# Patient Record
Sex: Female | Born: 1940
Health system: Southern US, Community
[De-identification: ages and names within clinical notes are randomized; demographics above are authoritative.]

## PROBLEM LIST (undated history)

## (undated) DIAGNOSIS — I350 Nonrheumatic aortic (valve) stenosis: Secondary | ICD-10-CM

## (undated) DIAGNOSIS — R51 Headache: Secondary | ICD-10-CM

## (undated) DIAGNOSIS — R911 Solitary pulmonary nodule: Secondary | ICD-10-CM

## (undated) DIAGNOSIS — K5730 Diverticulosis of large intestine without perforation or abscess without bleeding: Secondary | ICD-10-CM

## (undated) DIAGNOSIS — K529 Noninfective gastroenteritis and colitis, unspecified: Secondary | ICD-10-CM

## (undated) DIAGNOSIS — B9689 Other specified bacterial agents as the cause of diseases classified elsewhere: Secondary | ICD-10-CM

## (undated) DIAGNOSIS — D649 Anemia, unspecified: Secondary | ICD-10-CM

## (undated) DIAGNOSIS — Z780 Asymptomatic menopausal state: Secondary | ICD-10-CM

## (undated) DIAGNOSIS — R011 Cardiac murmur, unspecified: Secondary | ICD-10-CM

## (undated) DIAGNOSIS — Z9889 Other specified postprocedural states: Secondary | ICD-10-CM

## (undated) DIAGNOSIS — N184 Chronic kidney disease, stage 4 (severe): Secondary | ICD-10-CM

## (undated) DIAGNOSIS — I509 Heart failure, unspecified: Secondary | ICD-10-CM

## (undated) DIAGNOSIS — M1990 Unspecified osteoarthritis, unspecified site: Secondary | ICD-10-CM

## (undated) DIAGNOSIS — N186 End stage renal disease: Secondary | ICD-10-CM

## (undated) DIAGNOSIS — I639 Cerebral infarction, unspecified: Secondary | ICD-10-CM

## (undated) DIAGNOSIS — J329 Chronic sinusitis, unspecified: Secondary | ICD-10-CM

## (undated) DIAGNOSIS — I10 Essential (primary) hypertension: Secondary | ICD-10-CM

## (undated) DIAGNOSIS — N938 Other specified abnormal uterine and vaginal bleeding: Secondary | ICD-10-CM

## (undated) DIAGNOSIS — K5641 Fecal impaction: Secondary | ICD-10-CM

## (undated) DIAGNOSIS — Z992 Dependence on renal dialysis: Secondary | ICD-10-CM

## (undated) DIAGNOSIS — B356 Tinea cruris: Secondary | ICD-10-CM

## (undated) HISTORY — DX: Diverticulosis of large intestine without perforation or abscess without bleeding: K57.30

## (undated) HISTORY — DX: Unspecified osteoarthritis, unspecified site: M19.90

## (undated) HISTORY — PX: IRIDOTOMY / IRIDECTOMY: SHX165

## (undated) HISTORY — PX: ABDOMINAL HYSTERECTOMY: SHX81

## (undated) HISTORY — DX: Tinea cruris: B35.6

## (undated) HISTORY — DX: Other specified abnormal uterine and vaginal bleeding: N93.8

## (undated) HISTORY — PX: COLONOSCOPY: SHX174

## (undated) HISTORY — DX: Solitary pulmonary nodule: R91.1

## (undated) HISTORY — DX: Essential (primary) hypertension: I10

## (undated) HISTORY — DX: Other specified bacterial agents as the cause of diseases classified elsewhere: B96.89

## (undated) HISTORY — DX: Chronic sinusitis, unspecified: J32.9

## (undated) HISTORY — DX: Asymptomatic menopausal state: Z78.0

## (undated) HISTORY — DX: Noninfective gastroenteritis and colitis, unspecified: K52.9

## (undated) HISTORY — DX: Chronic kidney disease, stage 4 (severe): N18.4

## (undated) HISTORY — DX: Other specified postprocedural states: Z98.89

## (undated) HISTORY — DX: Fecal impaction: K56.41

## (undated) HISTORY — DX: Cerebral infarction, unspecified: I63.9

---

## 1997-12-13 ENCOUNTER — Other Ambulatory Visit: Admission: RE | Admit: 1997-12-13 | Discharge: 1997-12-13 | Payer: Self-pay | Admitting: *Deleted

## 1997-12-13 ENCOUNTER — Encounter: Admission: RE | Admit: 1997-12-13 | Discharge: 1997-12-13 | Payer: Self-pay | Admitting: Hematology and Oncology

## 1998-01-19 ENCOUNTER — Ambulatory Visit: Admission: RE | Admit: 1998-01-19 | Discharge: 1998-01-19 | Payer: Self-pay | Admitting: *Deleted

## 1998-07-02 ENCOUNTER — Encounter: Payer: Self-pay | Admitting: *Deleted

## 1998-07-02 ENCOUNTER — Emergency Department (HOSPITAL_COMMUNITY): Admission: EM | Admit: 1998-07-02 | Discharge: 1998-07-02 | Payer: Self-pay | Admitting: Emergency Medicine

## 1999-03-15 ENCOUNTER — Encounter: Admission: RE | Admit: 1999-03-15 | Discharge: 1999-03-15 | Payer: Self-pay | Admitting: Internal Medicine

## 1999-08-21 ENCOUNTER — Encounter: Admission: RE | Admit: 1999-08-21 | Discharge: 1999-08-21 | Payer: Self-pay | Admitting: Internal Medicine

## 2000-03-17 ENCOUNTER — Encounter: Admission: RE | Admit: 2000-03-17 | Discharge: 2000-03-17 | Payer: Self-pay | Admitting: Internal Medicine

## 2002-12-30 ENCOUNTER — Emergency Department (HOSPITAL_COMMUNITY): Admission: EM | Admit: 2002-12-30 | Discharge: 2002-12-30 | Payer: Self-pay | Admitting: Emergency Medicine

## 2002-12-30 ENCOUNTER — Encounter: Payer: Self-pay | Admitting: Emergency Medicine

## 2003-01-27 ENCOUNTER — Encounter: Payer: Self-pay | Admitting: *Deleted

## 2003-01-27 ENCOUNTER — Inpatient Hospital Stay (HOSPITAL_COMMUNITY): Admission: EM | Admit: 2003-01-27 | Discharge: 2003-01-28 | Payer: Self-pay | Admitting: *Deleted

## 2003-02-05 ENCOUNTER — Emergency Department (HOSPITAL_COMMUNITY): Admission: EM | Admit: 2003-02-05 | Discharge: 2003-02-06 | Payer: Self-pay | Admitting: Emergency Medicine

## 2003-02-05 ENCOUNTER — Encounter: Payer: Self-pay | Admitting: Emergency Medicine

## 2003-02-07 ENCOUNTER — Emergency Department (HOSPITAL_COMMUNITY): Admission: EM | Admit: 2003-02-07 | Discharge: 2003-02-07 | Payer: Self-pay | Admitting: Emergency Medicine

## 2003-02-07 ENCOUNTER — Encounter: Payer: Self-pay | Admitting: Emergency Medicine

## 2003-02-08 ENCOUNTER — Encounter: Admission: RE | Admit: 2003-02-08 | Discharge: 2003-02-08 | Payer: Self-pay | Admitting: Internal Medicine

## 2003-02-10 ENCOUNTER — Ambulatory Visit (HOSPITAL_COMMUNITY): Admission: RE | Admit: 2003-02-10 | Discharge: 2003-02-10 | Payer: Self-pay | Admitting: Internal Medicine

## 2003-02-10 ENCOUNTER — Encounter: Payer: Self-pay | Admitting: Internal Medicine

## 2003-02-15 ENCOUNTER — Encounter: Admission: RE | Admit: 2003-02-15 | Discharge: 2003-02-15 | Payer: Self-pay | Admitting: Internal Medicine

## 2003-02-22 ENCOUNTER — Encounter: Admission: RE | Admit: 2003-02-22 | Discharge: 2003-02-22 | Payer: Self-pay | Admitting: Internal Medicine

## 2003-03-03 ENCOUNTER — Encounter: Admission: RE | Admit: 2003-03-03 | Discharge: 2003-03-03 | Payer: Self-pay | Admitting: Internal Medicine

## 2003-06-14 ENCOUNTER — Ambulatory Visit (HOSPITAL_COMMUNITY): Admission: RE | Admit: 2003-06-14 | Discharge: 2003-06-14 | Payer: Self-pay | Admitting: Internal Medicine

## 2003-08-16 ENCOUNTER — Emergency Department (HOSPITAL_COMMUNITY): Admission: EM | Admit: 2003-08-16 | Discharge: 2003-08-16 | Payer: Self-pay | Admitting: Emergency Medicine

## 2003-08-22 ENCOUNTER — Encounter: Admission: RE | Admit: 2003-08-22 | Discharge: 2003-08-22 | Payer: Self-pay | Admitting: Internal Medicine

## 2003-11-07 ENCOUNTER — Encounter: Admission: RE | Admit: 2003-11-07 | Discharge: 2003-11-07 | Payer: Self-pay | Admitting: Internal Medicine

## 2003-11-07 ENCOUNTER — Encounter (INDEPENDENT_AMBULATORY_CARE_PROVIDER_SITE_OTHER): Payer: Self-pay | Admitting: Internal Medicine

## 2003-11-22 ENCOUNTER — Ambulatory Visit (HOSPITAL_COMMUNITY): Admission: RE | Admit: 2003-11-22 | Discharge: 2003-11-22 | Payer: Self-pay | Admitting: Internal Medicine

## 2003-11-22 ENCOUNTER — Encounter: Admission: RE | Admit: 2003-11-22 | Discharge: 2003-11-22 | Payer: Self-pay | Admitting: Internal Medicine

## 2004-09-05 ENCOUNTER — Ambulatory Visit: Payer: Self-pay | Admitting: Internal Medicine

## 2004-09-19 ENCOUNTER — Ambulatory Visit (HOSPITAL_COMMUNITY): Admission: RE | Admit: 2004-09-19 | Discharge: 2004-09-19 | Payer: Self-pay | Admitting: Internal Medicine

## 2004-11-29 ENCOUNTER — Emergency Department (HOSPITAL_COMMUNITY): Admission: EM | Admit: 2004-11-29 | Discharge: 2004-11-29 | Payer: Self-pay | Admitting: Emergency Medicine

## 2004-11-30 ENCOUNTER — Ambulatory Visit: Payer: Self-pay | Admitting: Internal Medicine

## 2004-12-05 ENCOUNTER — Ambulatory Visit (HOSPITAL_COMMUNITY): Admission: RE | Admit: 2004-12-05 | Discharge: 2004-12-05 | Payer: Self-pay | Admitting: Internal Medicine

## 2004-12-28 ENCOUNTER — Encounter (INDEPENDENT_AMBULATORY_CARE_PROVIDER_SITE_OTHER): Payer: Self-pay | Admitting: Specialist

## 2004-12-28 ENCOUNTER — Ambulatory Visit (HOSPITAL_COMMUNITY): Admission: RE | Admit: 2004-12-28 | Discharge: 2004-12-28 | Payer: Self-pay | Admitting: Obstetrics & Gynecology

## 2005-02-04 ENCOUNTER — Ambulatory Visit: Payer: Self-pay | Admitting: Internal Medicine

## 2005-07-31 ENCOUNTER — Ambulatory Visit (HOSPITAL_COMMUNITY): Admission: RE | Admit: 2005-07-31 | Discharge: 2005-07-31 | Payer: Self-pay | Admitting: Obstetrics & Gynecology

## 2005-09-27 ENCOUNTER — Ambulatory Visit: Payer: Self-pay | Admitting: Internal Medicine

## 2005-11-06 ENCOUNTER — Ambulatory Visit (HOSPITAL_COMMUNITY): Admission: RE | Admit: 2005-11-06 | Discharge: 2005-11-06 | Payer: Self-pay | Admitting: Internal Medicine

## 2005-11-06 ENCOUNTER — Encounter (INDEPENDENT_AMBULATORY_CARE_PROVIDER_SITE_OTHER): Payer: Self-pay | Admitting: Internal Medicine

## 2005-11-19 ENCOUNTER — Ambulatory Visit: Payer: Self-pay | Admitting: Internal Medicine

## 2005-12-03 ENCOUNTER — Ambulatory Visit: Payer: Self-pay | Admitting: Internal Medicine

## 2005-12-17 ENCOUNTER — Ambulatory Visit: Payer: Self-pay | Admitting: Internal Medicine

## 2005-12-23 ENCOUNTER — Ambulatory Visit (HOSPITAL_COMMUNITY): Admission: RE | Admit: 2005-12-23 | Discharge: 2005-12-23 | Payer: Self-pay | Admitting: Internal Medicine

## 2006-03-27 ENCOUNTER — Emergency Department (HOSPITAL_COMMUNITY): Admission: EM | Admit: 2006-03-27 | Discharge: 2006-03-27 | Payer: Self-pay | Admitting: Emergency Medicine

## 2006-03-28 ENCOUNTER — Emergency Department (HOSPITAL_COMMUNITY): Admission: EM | Admit: 2006-03-28 | Discharge: 2006-03-28 | Payer: Self-pay | Admitting: Emergency Medicine

## 2006-07-08 HISTORY — PX: INGUINAL HERNIA REPAIR: SHX194

## 2006-08-04 ENCOUNTER — Telehealth: Payer: Self-pay | Admitting: *Deleted

## 2006-08-11 ENCOUNTER — Telehealth (INDEPENDENT_AMBULATORY_CARE_PROVIDER_SITE_OTHER): Payer: Self-pay | Admitting: *Deleted

## 2006-08-11 ENCOUNTER — Ambulatory Visit: Payer: Self-pay | Admitting: Internal Medicine

## 2006-08-11 ENCOUNTER — Encounter (INDEPENDENT_AMBULATORY_CARE_PROVIDER_SITE_OTHER): Payer: Self-pay | Admitting: *Deleted

## 2006-08-11 DIAGNOSIS — D259 Leiomyoma of uterus, unspecified: Secondary | ICD-10-CM | POA: Insufficient documentation

## 2006-08-11 DIAGNOSIS — I10 Essential (primary) hypertension: Secondary | ICD-10-CM

## 2006-08-11 DIAGNOSIS — H547 Unspecified visual loss: Secondary | ICD-10-CM

## 2006-08-11 DIAGNOSIS — N184 Chronic kidney disease, stage 4 (severe): Secondary | ICD-10-CM

## 2006-08-11 DIAGNOSIS — K219 Gastro-esophageal reflux disease without esophagitis: Secondary | ICD-10-CM | POA: Insufficient documentation

## 2006-08-11 DIAGNOSIS — K5732 Diverticulitis of large intestine without perforation or abscess without bleeding: Secondary | ICD-10-CM | POA: Insufficient documentation

## 2006-08-11 DIAGNOSIS — Z9889 Other specified postprocedural states: Secondary | ICD-10-CM

## 2006-08-11 HISTORY — DX: Chronic kidney disease, stage 4 (severe): N18.4

## 2006-08-11 HISTORY — DX: Other specified postprocedural states: Z98.89

## 2006-08-11 LAB — CONVERTED CEMR LAB
BUN: 30 mg/dL — ABNORMAL HIGH (ref 6–23)
Bilirubin Urine: NEGATIVE
CO2: 27 meq/L (ref 19–32)
Calcium: 9.9 mg/dL (ref 8.4–10.5)
Chloride: 104 meq/L (ref 96–112)
Cholesterol: 182 mg/dL (ref 0–200)
Creatinine, Ser: 1.81 mg/dL — ABNORMAL HIGH (ref 0.40–1.20)
HCT: 38.1 % (ref 36.0–46.0)
HDL: 56 mg/dL (ref >39)
Hemoglobin, Urine: NEGATIVE
Hemoglobin: 12.4 g/dL (ref 12.0–15.0)
Ketones, ur: NEGATIVE mg/dL
MCV: 88.6 fL (ref 78.0–100.0)
Microalb Creat Ratio: 312.4 mg/g — ABNORMAL HIGH (ref 0.0–30.0)
Protein, ur: 100 mg/dL — AB
RDW: 13.8 % (ref 11.5–14.0)
Total Bilirubin: 0.4 mg/dL (ref 0.3–1.2)
Total CHOL/HDL Ratio: 3.3
Urine Glucose: NEGATIVE mg/dL
WBC: 5.9 10*3/uL (ref 4.0–10.5)

## 2006-08-19 ENCOUNTER — Encounter (INDEPENDENT_AMBULATORY_CARE_PROVIDER_SITE_OTHER): Payer: Self-pay | Admitting: *Deleted

## 2006-08-19 ENCOUNTER — Ambulatory Visit (HOSPITAL_COMMUNITY): Admission: RE | Admit: 2006-08-19 | Discharge: 2006-08-19 | Payer: Self-pay | Admitting: Obstetrics and Gynecology

## 2006-08-28 ENCOUNTER — Telehealth: Payer: Self-pay | Admitting: *Deleted

## 2006-09-02 ENCOUNTER — Encounter (INDEPENDENT_AMBULATORY_CARE_PROVIDER_SITE_OTHER): Payer: Self-pay | Admitting: *Deleted

## 2006-09-02 ENCOUNTER — Ambulatory Visit: Payer: Self-pay | Admitting: Internal Medicine

## 2006-09-02 ENCOUNTER — Ambulatory Visit (HOSPITAL_COMMUNITY): Admission: RE | Admit: 2006-09-02 | Discharge: 2006-09-02 | Payer: Self-pay | Admitting: Hospitalist

## 2006-09-02 DIAGNOSIS — R011 Cardiac murmur, unspecified: Secondary | ICD-10-CM

## 2006-09-02 DIAGNOSIS — I517 Cardiomegaly: Secondary | ICD-10-CM | POA: Insufficient documentation

## 2006-09-08 ENCOUNTER — Ambulatory Visit: Payer: Self-pay | Admitting: Infectious Diseases

## 2006-09-08 ENCOUNTER — Inpatient Hospital Stay (HOSPITAL_COMMUNITY): Admission: EM | Admit: 2006-09-08 | Discharge: 2006-09-10 | Payer: Self-pay | Admitting: Emergency Medicine

## 2006-09-10 ENCOUNTER — Encounter: Payer: Self-pay | Admitting: Internal Medicine

## 2006-09-30 ENCOUNTER — Encounter (INDEPENDENT_AMBULATORY_CARE_PROVIDER_SITE_OTHER): Payer: Self-pay | Admitting: *Deleted

## 2006-09-30 ENCOUNTER — Ambulatory Visit: Payer: Self-pay | Admitting: Internal Medicine

## 2006-09-30 LAB — CONVERTED CEMR LAB
Albumin: 4.3 g/dL (ref 3.5–5.2)
CO2: 24 meq/L (ref 19–32)
Eosinophils Relative: 2 % (ref 0–5)
Glucose, Bld: 96 mg/dL (ref 70–99)
HCT: 38.2 % (ref 36.0–46.0)
Hemoglobin: 12.2 g/dL (ref 12.0–15.0)
Leukocytes, UA: NEGATIVE
Lymphocytes Relative: 35 % (ref 12–46)
Lymphs Abs: 1.9 10*3/uL (ref 0.7–3.3)
Nitrite: NEGATIVE
Platelets: 278 10*3/uL (ref 150–400)
Protein, ur: >300 mg/dL — AB
RBC: 4.31 M/uL (ref 3.87–5.11)
Sodium: 144 meq/L (ref 135–145)
Total Bilirubin: 0.4 mg/dL (ref 0.3–1.2)
Total Protein: 7.9 g/dL (ref 6.0–8.3)
Urine Glucose: NEGATIVE mg/dL
Urobilinogen, UA: 0.2 (ref 0.0–1.0)
WBC: 5.4 10*3/uL (ref 4.0–10.5)

## 2006-10-01 ENCOUNTER — Encounter (INDEPENDENT_AMBULATORY_CARE_PROVIDER_SITE_OTHER): Payer: Self-pay | Admitting: *Deleted

## 2006-10-07 ENCOUNTER — Encounter (INDEPENDENT_AMBULATORY_CARE_PROVIDER_SITE_OTHER): Payer: Self-pay | Admitting: *Deleted

## 2006-10-20 ENCOUNTER — Encounter (INDEPENDENT_AMBULATORY_CARE_PROVIDER_SITE_OTHER): Payer: Self-pay | Admitting: *Deleted

## 2006-10-23 ENCOUNTER — Ambulatory Visit: Payer: Self-pay | Admitting: Internal Medicine

## 2006-11-12 ENCOUNTER — Ambulatory Visit (HOSPITAL_COMMUNITY): Admission: RE | Admit: 2006-11-12 | Discharge: 2006-11-12 | Payer: Self-pay | Admitting: Obstetrics and Gynecology

## 2006-12-26 ENCOUNTER — Telehealth (INDEPENDENT_AMBULATORY_CARE_PROVIDER_SITE_OTHER): Payer: Self-pay | Admitting: *Deleted

## 2007-01-12 ENCOUNTER — Encounter (INDEPENDENT_AMBULATORY_CARE_PROVIDER_SITE_OTHER): Payer: Self-pay | Admitting: *Deleted

## 2007-01-12 ENCOUNTER — Ambulatory Visit: Payer: Self-pay | Admitting: Hospitalist

## 2007-01-12 DIAGNOSIS — B356 Tinea cruris: Secondary | ICD-10-CM

## 2007-01-12 DIAGNOSIS — J984 Other disorders of lung: Secondary | ICD-10-CM | POA: Insufficient documentation

## 2007-01-12 HISTORY — DX: Tinea cruris: B35.6

## 2007-01-14 ENCOUNTER — Ambulatory Visit (HOSPITAL_COMMUNITY): Admission: RE | Admit: 2007-01-14 | Discharge: 2007-01-14 | Payer: Self-pay | Admitting: *Deleted

## 2007-01-20 ENCOUNTER — Telehealth (INDEPENDENT_AMBULATORY_CARE_PROVIDER_SITE_OTHER): Payer: Self-pay | Admitting: Pharmacy Technician

## 2007-02-13 ENCOUNTER — Ambulatory Visit: Payer: Self-pay | Admitting: Infectious Disease

## 2007-02-13 DIAGNOSIS — E785 Hyperlipidemia, unspecified: Secondary | ICD-10-CM | POA: Insufficient documentation

## 2007-02-18 ENCOUNTER — Ambulatory Visit: Payer: Self-pay | Admitting: Infectious Disease

## 2007-02-18 ENCOUNTER — Encounter (INDEPENDENT_AMBULATORY_CARE_PROVIDER_SITE_OTHER): Payer: Self-pay | Admitting: *Deleted

## 2007-02-19 LAB — CONVERTED CEMR LAB
BUN: 26 mg/dL — ABNORMAL HIGH (ref 6–23)
CO2: 26 meq/L (ref 19–32)
Calcium: 9.9 mg/dL (ref 8.4–10.5)
Chloride: 107 meq/L (ref 96–112)
Cholesterol: 174 mg/dL (ref 0–200)
Creatinine, Ser: 1.9 mg/dL — ABNORMAL HIGH (ref 0.40–1.20)
Glucose, Bld: 95 mg/dL (ref 70–99)
HDL: 52 mg/dL (ref >39)
Total CHOL/HDL Ratio: 3.3

## 2007-02-25 ENCOUNTER — Encounter: Admission: RE | Admit: 2007-02-25 | Discharge: 2007-05-26 | Payer: Self-pay | Admitting: *Deleted

## 2007-02-25 ENCOUNTER — Encounter (INDEPENDENT_AMBULATORY_CARE_PROVIDER_SITE_OTHER): Payer: Self-pay | Admitting: *Deleted

## 2007-03-10 ENCOUNTER — Encounter (INDEPENDENT_AMBULATORY_CARE_PROVIDER_SITE_OTHER): Payer: Self-pay | Admitting: *Deleted

## 2007-03-24 ENCOUNTER — Ambulatory Visit: Payer: Self-pay | Admitting: Hospitalist

## 2007-03-24 ENCOUNTER — Encounter (INDEPENDENT_AMBULATORY_CARE_PROVIDER_SITE_OTHER): Payer: Self-pay | Admitting: *Deleted

## 2007-03-25 LAB — CONVERTED CEMR LAB
BUN: 29 mg/dL — ABNORMAL HIGH (ref 6–23)
Calcium: 9.5 mg/dL (ref 8.4–10.5)
Glucose, Bld: 88 mg/dL (ref 70–99)

## 2007-04-09 ENCOUNTER — Encounter
Admission: RE | Admit: 2007-04-09 | Discharge: 2007-04-22 | Payer: Self-pay | Admitting: Physical Medicine & Rehabilitation

## 2007-04-15 ENCOUNTER — Telehealth (INDEPENDENT_AMBULATORY_CARE_PROVIDER_SITE_OTHER): Payer: Self-pay | Admitting: *Deleted

## 2007-04-23 ENCOUNTER — Encounter (INDEPENDENT_AMBULATORY_CARE_PROVIDER_SITE_OTHER): Payer: Self-pay | Admitting: *Deleted

## 2007-04-23 ENCOUNTER — Ambulatory Visit: Payer: Self-pay | Admitting: Internal Medicine

## 2007-04-23 ENCOUNTER — Ambulatory Visit (HOSPITAL_COMMUNITY): Admission: RE | Admit: 2007-04-23 | Discharge: 2007-04-23 | Payer: Self-pay | Admitting: Internal Medicine

## 2007-07-09 HISTORY — PX: CHOLECYSTECTOMY: SHX55

## 2007-08-13 ENCOUNTER — Telehealth (INDEPENDENT_AMBULATORY_CARE_PROVIDER_SITE_OTHER): Payer: Self-pay | Admitting: *Deleted

## 2007-08-18 ENCOUNTER — Ambulatory Visit: Payer: Self-pay | Admitting: Internal Medicine

## 2007-08-21 ENCOUNTER — Encounter (INDEPENDENT_AMBULATORY_CARE_PROVIDER_SITE_OTHER): Payer: Self-pay | Admitting: *Deleted

## 2007-08-21 ENCOUNTER — Ambulatory Visit: Payer: Self-pay | Admitting: Internal Medicine

## 2007-08-21 LAB — CONVERTED CEMR LAB
Calcium: 9.3 mg/dL (ref 8.4–10.5)
Creatinine, Ser: 1.84 mg/dL — ABNORMAL HIGH (ref 0.40–1.20)
Glucose, Bld: 112 mg/dL — ABNORMAL HIGH (ref 70–99)
Sodium: 139 meq/L (ref 135–145)

## 2007-10-08 ENCOUNTER — Emergency Department (HOSPITAL_COMMUNITY): Admission: EM | Admit: 2007-10-08 | Discharge: 2007-10-08 | Payer: Self-pay | Admitting: Emergency Medicine

## 2007-11-01 ENCOUNTER — Ambulatory Visit: Payer: Self-pay | Admitting: Internal Medicine

## 2007-11-01 ENCOUNTER — Encounter: Payer: Self-pay | Admitting: Emergency Medicine

## 2007-11-01 ENCOUNTER — Inpatient Hospital Stay (HOSPITAL_COMMUNITY): Admission: EM | Admit: 2007-11-01 | Discharge: 2007-11-06 | Payer: Self-pay | Admitting: Internal Medicine

## 2007-11-02 ENCOUNTER — Encounter (INDEPENDENT_AMBULATORY_CARE_PROVIDER_SITE_OTHER): Payer: Self-pay | Admitting: *Deleted

## 2007-11-03 ENCOUNTER — Ambulatory Visit: Payer: Self-pay | Admitting: Vascular Surgery

## 2007-11-04 ENCOUNTER — Encounter (INDEPENDENT_AMBULATORY_CARE_PROVIDER_SITE_OTHER): Payer: Self-pay | Admitting: Surgery

## 2007-11-17 ENCOUNTER — Encounter (INDEPENDENT_AMBULATORY_CARE_PROVIDER_SITE_OTHER): Payer: Self-pay | Admitting: *Deleted

## 2007-12-11 ENCOUNTER — Emergency Department (HOSPITAL_COMMUNITY): Admission: EM | Admit: 2007-12-11 | Discharge: 2007-12-11 | Payer: Self-pay | Admitting: Emergency Medicine

## 2007-12-12 ENCOUNTER — Inpatient Hospital Stay (HOSPITAL_COMMUNITY): Admission: EM | Admit: 2007-12-12 | Discharge: 2007-12-16 | Payer: Self-pay | Admitting: Emergency Medicine

## 2007-12-14 ENCOUNTER — Ambulatory Visit: Payer: Self-pay | Admitting: Physical Medicine & Rehabilitation

## 2007-12-21 ENCOUNTER — Emergency Department (HOSPITAL_COMMUNITY): Admission: EM | Admit: 2007-12-21 | Discharge: 2007-12-21 | Payer: Self-pay | Admitting: Emergency Medicine

## 2007-12-24 ENCOUNTER — Telehealth (INDEPENDENT_AMBULATORY_CARE_PROVIDER_SITE_OTHER): Payer: Self-pay | Admitting: *Deleted

## 2007-12-25 ENCOUNTER — Observation Stay (HOSPITAL_COMMUNITY): Admission: EM | Admit: 2007-12-25 | Discharge: 2007-12-29 | Payer: Self-pay | Admitting: Emergency Medicine

## 2007-12-31 ENCOUNTER — Telehealth: Payer: Self-pay | Admitting: *Deleted

## 2008-01-07 ENCOUNTER — Encounter (INDEPENDENT_AMBULATORY_CARE_PROVIDER_SITE_OTHER): Payer: Self-pay | Admitting: *Deleted

## 2008-01-11 ENCOUNTER — Ambulatory Visit: Payer: Self-pay | Admitting: Internal Medicine

## 2008-01-11 ENCOUNTER — Observation Stay (HOSPITAL_COMMUNITY): Admission: EM | Admit: 2008-01-11 | Discharge: 2008-01-12 | Payer: Self-pay | Admitting: Emergency Medicine

## 2008-01-19 ENCOUNTER — Ambulatory Visit: Payer: Self-pay | Admitting: *Deleted

## 2008-01-20 ENCOUNTER — Encounter (INDEPENDENT_AMBULATORY_CARE_PROVIDER_SITE_OTHER): Payer: Self-pay | Admitting: *Deleted

## 2008-01-26 ENCOUNTER — Ambulatory Visit (HOSPITAL_COMMUNITY): Admission: RE | Admit: 2008-01-26 | Discharge: 2008-01-26 | Payer: Self-pay | Admitting: *Deleted

## 2008-01-28 ENCOUNTER — Encounter (INDEPENDENT_AMBULATORY_CARE_PROVIDER_SITE_OTHER): Payer: Self-pay | Admitting: Internal Medicine

## 2008-01-28 ENCOUNTER — Ambulatory Visit: Payer: Self-pay | Admitting: Infectious Diseases

## 2008-01-28 DIAGNOSIS — Z8673 Personal history of transient ischemic attack (TIA), and cerebral infarction without residual deficits: Secondary | ICD-10-CM | POA: Insufficient documentation

## 2008-01-29 LAB — CONVERTED CEMR LAB
CO2: 24 meq/L (ref 19–32)
Chloride: 108 meq/L (ref 96–112)
Creatinine, Ser: 1.47 mg/dL — ABNORMAL HIGH (ref 0.40–1.20)
HCT: 39 % (ref 36.0–46.0)
Platelets: 194 10*3/uL (ref 150–400)
Potassium: 4.4 meq/L (ref 3.5–5.3)
WBC: 4.3 10*3/uL (ref 4.0–10.5)

## 2008-02-08 ENCOUNTER — Encounter (INDEPENDENT_AMBULATORY_CARE_PROVIDER_SITE_OTHER): Payer: Self-pay | Admitting: *Deleted

## 2008-02-11 ENCOUNTER — Ambulatory Visit: Payer: Self-pay | Admitting: *Deleted

## 2008-02-11 ENCOUNTER — Encounter (INDEPENDENT_AMBULATORY_CARE_PROVIDER_SITE_OTHER): Payer: Self-pay | Admitting: *Deleted

## 2008-02-14 LAB — CONVERTED CEMR LAB
Albumin: 4 g/dL (ref 3.5–5.2)
Alkaline Phosphatase: 42 units/L (ref 39–117)
BUN: 34 mg/dL — ABNORMAL HIGH (ref 6–23)
CO2: 24 meq/L (ref 19–32)
Calcium: 9.4 mg/dL (ref 8.4–10.5)
Chloride: 104 meq/L (ref 96–112)
Cholesterol: 200 mg/dL (ref 0–200)
Glucose, Bld: 85 mg/dL (ref 70–99)
HDL: 59 mg/dL (ref >39)
Potassium: 4.5 meq/L (ref 3.5–5.3)
Total Protein: 7.5 g/dL (ref 6.0–8.3)
Triglycerides: 142 mg/dL (ref <150)

## 2008-02-22 ENCOUNTER — Encounter (INDEPENDENT_AMBULATORY_CARE_PROVIDER_SITE_OTHER): Payer: Self-pay | Admitting: *Deleted

## 2008-02-29 ENCOUNTER — Ambulatory Visit: Payer: Self-pay | Admitting: Internal Medicine

## 2008-02-29 ENCOUNTER — Encounter (INDEPENDENT_AMBULATORY_CARE_PROVIDER_SITE_OTHER): Payer: Self-pay | Admitting: *Deleted

## 2008-02-29 LAB — CONVERTED CEMR LAB
BUN: 26 mg/dL — ABNORMAL HIGH (ref 6–23)
CO2: 28 meq/L (ref 19–32)
Calcium: 9.3 mg/dL (ref 8.4–10.5)
Chloride: 108 meq/L (ref 96–112)
Creatinine, Ser: 1.94 mg/dL — ABNORMAL HIGH (ref 0.40–1.20)
Glucose, Bld: 94 mg/dL (ref 70–99)
Glucose, Synovial Fluid: 103 mg/dL

## 2008-03-29 ENCOUNTER — Encounter (INDEPENDENT_AMBULATORY_CARE_PROVIDER_SITE_OTHER): Payer: Self-pay | Admitting: *Deleted

## 2008-04-29 ENCOUNTER — Ambulatory Visit: Payer: Self-pay | Admitting: Internal Medicine

## 2008-08-15 ENCOUNTER — Encounter (INDEPENDENT_AMBULATORY_CARE_PROVIDER_SITE_OTHER): Payer: Self-pay | Admitting: *Deleted

## 2008-08-15 ENCOUNTER — Inpatient Hospital Stay (HOSPITAL_COMMUNITY): Admission: AD | Admit: 2008-08-15 | Discharge: 2008-08-19 | Payer: Self-pay | Admitting: Infectious Disease

## 2008-08-15 ENCOUNTER — Encounter: Payer: Self-pay | Admitting: Emergency Medicine

## 2008-08-15 ENCOUNTER — Ambulatory Visit: Payer: Self-pay | Admitting: Infectious Disease

## 2008-08-19 ENCOUNTER — Encounter (INDEPENDENT_AMBULATORY_CARE_PROVIDER_SITE_OTHER): Payer: Self-pay | Admitting: *Deleted

## 2008-09-05 ENCOUNTER — Encounter (INDEPENDENT_AMBULATORY_CARE_PROVIDER_SITE_OTHER): Payer: Self-pay | Admitting: *Deleted

## 2008-09-06 ENCOUNTER — Ambulatory Visit: Payer: Self-pay | Admitting: Internal Medicine

## 2008-09-06 ENCOUNTER — Encounter (INDEPENDENT_AMBULATORY_CARE_PROVIDER_SITE_OTHER): Payer: Self-pay | Admitting: *Deleted

## 2008-09-06 LAB — CONVERTED CEMR LAB
CO2: 24 meq/L (ref 19–32)
Calcium: 9.4 mg/dL (ref 8.4–10.5)
Glucose, Bld: 85 mg/dL (ref 70–99)
Potassium: 4.2 meq/L (ref 3.5–5.3)
Sodium: 144 meq/L (ref 135–145)

## 2008-09-30 ENCOUNTER — Encounter (INDEPENDENT_AMBULATORY_CARE_PROVIDER_SITE_OTHER): Payer: Self-pay | Admitting: *Deleted

## 2008-10-31 DIAGNOSIS — M1710 Unilateral primary osteoarthritis, unspecified knee: Secondary | ICD-10-CM

## 2009-02-03 ENCOUNTER — Ambulatory Visit (HOSPITAL_COMMUNITY): Admission: RE | Admit: 2009-02-03 | Discharge: 2009-02-03 | Payer: Self-pay | Admitting: *Deleted

## 2009-04-14 ENCOUNTER — Ambulatory Visit: Payer: Self-pay | Admitting: Internal Medicine

## 2009-04-20 ENCOUNTER — Telehealth: Payer: Self-pay | Admitting: Internal Medicine

## 2009-06-14 ENCOUNTER — Encounter (INDEPENDENT_AMBULATORY_CARE_PROVIDER_SITE_OTHER): Payer: Self-pay | Admitting: Internal Medicine

## 2009-06-14 ENCOUNTER — Ambulatory Visit: Payer: Self-pay | Admitting: Internal Medicine

## 2009-08-17 ENCOUNTER — Telehealth (INDEPENDENT_AMBULATORY_CARE_PROVIDER_SITE_OTHER): Payer: Self-pay | Admitting: Internal Medicine

## 2009-09-21 ENCOUNTER — Ambulatory Visit: Payer: Self-pay | Admitting: Internal Medicine

## 2009-09-21 ENCOUNTER — Telehealth (INDEPENDENT_AMBULATORY_CARE_PROVIDER_SITE_OTHER): Payer: Self-pay | Admitting: Internal Medicine

## 2009-09-21 DIAGNOSIS — M25519 Pain in unspecified shoulder: Secondary | ICD-10-CM

## 2009-09-21 DIAGNOSIS — R519 Headache, unspecified: Secondary | ICD-10-CM | POA: Insufficient documentation

## 2009-09-21 DIAGNOSIS — R51 Headache: Secondary | ICD-10-CM

## 2009-10-31 LAB — CONVERTED CEMR LAB
Amphetamine Screen, Ur: NEGATIVE
Barbiturate Quant, Ur: NEGATIVE
CO2: 25 meq/L (ref 19–32)
Calcium: 9.5 mg/dL (ref 8.4–10.5)
Chloride: 106 meq/L (ref 96–112)
Cocaine Metabolites: NEGATIVE
Creatinine, Ser: 2.39 mg/dL — ABNORMAL HIGH (ref 0.40–1.20)
Creatinine,U: 203.4 mg/dL
Glucose, Bld: 98 mg/dL (ref 70–99)
Methadone: NEGATIVE
Opiates: NEGATIVE

## 2009-11-06 ENCOUNTER — Encounter (INDEPENDENT_AMBULATORY_CARE_PROVIDER_SITE_OTHER): Payer: Self-pay | Admitting: Internal Medicine

## 2009-11-06 ENCOUNTER — Telehealth: Payer: Self-pay | Admitting: *Deleted

## 2009-11-06 ENCOUNTER — Telehealth (INDEPENDENT_AMBULATORY_CARE_PROVIDER_SITE_OTHER): Payer: Self-pay | Admitting: Internal Medicine

## 2010-02-22 ENCOUNTER — Ambulatory Visit (HOSPITAL_COMMUNITY): Admission: RE | Admit: 2010-02-22 | Discharge: 2010-02-22 | Payer: Self-pay | Admitting: Specialist

## 2010-05-02 ENCOUNTER — Telehealth: Payer: Self-pay | Admitting: *Deleted

## 2010-05-08 ENCOUNTER — Telehealth: Payer: Self-pay | Admitting: Internal Medicine

## 2010-05-14 ENCOUNTER — Ambulatory Visit: Payer: Self-pay | Admitting: Internal Medicine

## 2010-05-14 ENCOUNTER — Encounter: Payer: Self-pay | Admitting: Internal Medicine

## 2010-05-14 LAB — CONVERTED CEMR LAB
CO2: 24 meq/L (ref 19–32)
Calcium: 9.5 mg/dL (ref 8.4–10.5)
Creatinine, Ser: 2.96 mg/dL — ABNORMAL HIGH (ref 0.40–1.20)
Glucose, Bld: 96 mg/dL (ref 70–99)
Sodium: 145 meq/L (ref 135–145)

## 2010-06-22 ENCOUNTER — Ambulatory Visit: Payer: Self-pay

## 2010-07-16 ENCOUNTER — Ambulatory Visit: Admit: 2010-07-16 | Payer: Self-pay

## 2010-07-29 ENCOUNTER — Encounter: Payer: Self-pay | Admitting: *Deleted

## 2010-07-30 ENCOUNTER — Encounter: Payer: Self-pay | Admitting: Neurosurgery

## 2010-08-07 NOTE — Assessment & Plan Note (Signed)
Summary: ACUTE-HEAD HURTING AND DIZZY/CFB(RIOFRIO)/CFB   Vital Signs:  Patient profile:   70 year old female Height:      64 inches (162.56 cm) Weight:      212.1 pounds (96.41 kg) BMI:     36.54 Temp:     98.5 degrees F (36.94 degrees C) oral Pulse rate:   105 / minute BP sitting:   159 / 81  (right arm)  Vitals Entered By: Hilda Blades Ditzler RN (September 21, 2009 1:49 PM) Is Patient Diabetic? No Pain Assessment Patient in pain? yes     Location: left shoulder Intensity: 9 Type: throbbing Onset of pain  past 2 weeks all the time Nutritional Status BMI of > 30 = obese Nutritional Status Detail appetite good  Have you ever been in a relationship where you felt threatened, hurt or afraid?denies   Does patient need assistance? Functional Status Self care Ambulation Impaired:Risk for fall Comments Uses a cane. Refills on meds and ck left shoulder - hurts all the time past 2 weeks.   Primary Care Provider:  [name]   History of Present Illness: Pt is a 70 yo female w/ past med hx below here for L shoulder pain.  Pain started about 2 weeks ago and hurts from her L shoulder to L elbow.  It hurts more when she lifts it.  No known injury.  Heat has not helped.  No swelling, redness, or rash.  Of note, the prior phone notes mention that she called in for an appt for a HA and dizziness.  It has improved and she says she gets this everytime she runs out of her blood pressure pills and she ran out several days ago but took her last one this morning b/c of the HA.  She denies dizziness but this resolved once she took her blood pressure medication.  No visual changes, weakness, and nausea.  She needs a couple of refills on her meds.    Depression History:      The patient denies a depressed mood most of the day and a diminished interest in her usual daily activities.         Preventive Screening-Counseling & Management  Alcohol-Tobacco     Smoking Status:  never  Caffeine-Diet-Exercise     Does Patient Exercise: yes     Type of exercise: walking     Times/week: 0  Current Medications (verified): 1)  Norvasc 10 Mg Tabs (Amlodipine Besylate) .... Take 1 Tablet By Mouth Once A Day 2)  Famotidine 40 Mg Tabs (Famotidine) .... Take 1 Tablet By Mouth Once A Day 3)  Miralax   Powd (Polyethylene Glycol 3350) .... Take The Powder Once Daily Mixed With Water As Needed For At Least One Bowel Movement, Every 2 Days. 4)  Catapres 0.1 Mg Tabs (Clonidine Hcl) .... Take 1 Tablet By Mouth Two Times A Day  Allergies: 1)  ! Vicodin  Past History:  Past Medical History: Last updated: 08/15/2008 CVA    - s/p CVD (cerebellar hemorrhage) [id number], complicated by post-hemorrhagic headaches Fecal impaction s/p manual disimpaction 01/12/2008 Diverticulosis, colon Hypertension, stage II Renal insufficiency (baseline creat. 1.6-1.8) Partial blindness Pulmonary nodule - stable Dysfunctional uterine bleeding, s/p hysteroscopy + D/C in 12/2004: no atypia. Resolved since D/C. Postmenopausal since age 37 Right knee OA with spurring found on xray 02/2007    - knee injection 03/24/07, 02/2008    - in PT 02/2007 Left knee OA with degenerative changes found in 10/08 Colitis, unspecified, 09-2006  Last Pap 01/2007: normal  Past Surgical History: Last updated: 02/11/2008 Herniorrhaphy, inguinal in 1982? Hysteroscopy with D/C on 12/29/2004 s/p cholecystectomy 10/2007  Social History: Last updated: 08/15/2008 Married, lives with her husband. 1 child. Never Smoked Alcohol use-no Drug use-no Marital Status: Married Children:  Occupation:   Social History: Reviewed history from 08/15/2008 and no changes required. Married, lives with her husband. 1 child. Never Smoked Alcohol use-no Drug use-no Marital Status: Married Children:  Occupation:   Review of Systems       As per HPI.  Physical Exam  General:  alert, oreinted, no distress, cane used for  ambulation.  Eyes:  PERRL, EOMI, no papilledema, abnormal retinas w/ AV nicking, vitreous floaters noted-Follows w/ Dr. Ricki Miller.  Mouth:  MMM. Lungs:  Normal respiratory effort, chest expands symmetrically. Lungs are clear to auscultation, no crackles or wheezes. Heart:  RRR, SEM III/VI at RSB w/ radiation to the carotids, no rubs/gallops. Abdomen:  +BS's, soft, NT and ND.   Msk:  L shoulder without increased warmth and erythema.  No rash.  Decreased ROM w/ abduction, unable to fully internally rotate secondary to pain.  Shoulder TTP posteriorly.  Sensation and pulse intact on the L arm.   Extremities:  no peripheral edema. Neurologic:  sensation intact in all extremities, gait asssited w/ cane, PERRL, EOMI, stregnth limited in L upper extremity w/ abduction secondary to pain.  Psych:  mood euthymic.    Impression & Recommendations:  Problem # 1:  SHOULDER PAIN, LEFT (ICD-719.41) PE most c/w rotator cuff tendonopathy vs tear.  Will manage conservatively at this time w/ scheduled tylenol, rest w/ shoulder splint, and regular icing. No NSAID's secondary to renal dz.   Will f/u in 2 weeks.  If abnormal, may need imaging vs sport's medicine referral.  Her updated medication list for this problem includes:    Tylenol Extra Strength 500 Mg Tabs (Acetaminophen) .Marland Kitchen... Take two tabs by mouth every 8 hours.  Problem # 2:  COCAINE ABUSE (ICD-305.60) UDS today.  Would be helpful to know if she continues to be positive b/c of hypertension.  Orders: T-Drug Screen-Urine, (single) [phone])  Problem # 3:  RENAL INSUFFICIENCY (ICD-588.9) F/u cr today.  Orders: T-Basic Metabolic Panel (99991111)  Problem # 4:  HYPERTENSION (ICD-401.9) Up but she has been out of her meds for 2+ days then took one of her meds today to see if that helped her HA symptoms.  Will not adjust dose in light of her not taking her meds for several days.  I have refilled them and will f/u in 2 weeks.  Will also check  UDS in light of prior cocaine use as this may be contributing to her elevated blood pressure.  Her updated medication list for this problem includes:    Norvasc 10 Mg Tabs (Amlodipine besylate) .Marland Kitchen... Take 1 tablet by mouth once a day    Catapres 0.1 Mg Tabs (Clonidine hcl) .Marland Kitchen... Take 1 tablet by mouth two times a day  Problem # 5:  HEADACHE (ICD-784.0) Initially concerned given her hx of dizziness and prior stroke, however, Ms. Joswick says she gets these same symptoms everytime she runs out of her blood pressure meds and it is now better since she took one of her meds.  No papilledema on exam and blood pressure isn't that high at this time to cause hyptersive emergency.  She will resume her meds and see if this helps.  Her updated medication list for this problem includes:  Tylenol Extra Strength 500 Mg Tabs (Acetaminophen) .Marland Kitchen... Take two tabs by mouth every 8 hours.  Problem # 6:  G E R D (ICD-530.81) Symptoms controlled w/ famotidine.  Her updated medication list for this problem includes:    Famotidine 40 Mg Tabs (Famotidine) .Marland Kitchen... Take 1 tablet by mouth once a day  Complete Medication List: 1)  Norvasc 10 Mg Tabs (Amlodipine besylate) .... Take 1 tablet by mouth once a day 2)  Famotidine 40 Mg Tabs (Famotidine) .... Take 1 tablet by mouth once a day 3)  Miralax Powd (Polyethylene glycol 3350) .... Take the powder once daily mixed with water as needed for at least one bowel movement, every 2 days. 4)  Catapres 0.1 Mg Tabs (Clonidine hcl) .... Take 1 tablet by mouth two times a day 5)  Tylenol Extra Strength 500 Mg Tabs (Acetaminophen) .... Take two tabs by mouth every 8 hours.  Patient Instructions: 1)  Please make a followup appointment in 2 weeks to check on your shoulder. 2)  Call sooner if you get worse. 3)  Please use your splint for one week. 4)  Apply ice to sore area for 20 minutes 3-4 times a day for 2 weeks. Prescriptions: FAMOTIDINE 40 MG TABS (FAMOTIDINE) Take 1 tablet  by mouth once a day  #30 Tablet x 6   Entered and Authorized by:   Junius Finner  MD   Signed by:   Junius Finner  MD on 09/21/2009   Method used:   Electronically to        [facility] (retail)       [place], [place]  [id number]       Ph: [phone]       Fax: [phone]   [id number]:[id number] NORVASC 10 MG TABS (AMLODIPINE BESYLATE) Take 1 tablet by mouth once a day  #30 Tablet x 6   Entered and Authorized by:   Junius Finner  MD   Signed by:   Junius Finner  MD on 09/21/2009   Method used:   Electronically to        [facility] (retail)       [place], [place]  [id number]       Ph: [phone]       Fax: [phone]   RxIDWY:[id number] TYLENOL EXTRA STRENGTH 500 MG TABS (ACETAMINOPHEN) Take two tabs by mouth every 8 hours.  #180 x prn   Entered and Authorized by:   Junius Finner  MD   Signed by:   Junius Finner  MD on 09/21/2009   Method used:   Electronically to        [facility] (retail)       [place], [place]  [id number]       Ph: [phone]       Fax: [phone]   RxID:   [id number]  Process Orders Check Orders Results:     [facility]: Check successful Tests Sent for requisitioning (September 21, 2009 2:48 PM):     09/21/2009: [facility] -- T-Basic Metabolic Panel 0000000 (signed)     09/21/2009: [facility] -- T-Drug Screen-Urine, (single) [phone] (signed)    Prevention & Chronic Care Immunizations   Influenza vaccine: Fluvax 3+  (04/14/2009)    Tetanus booster:  Not documented    Pneumococcal vaccine: Not documented    H. zoster vaccine: Not documented  Colorectal Screening   Hemoccult: Not documented    Colonoscopy: Not documented   Colonoscopy action/deferral: scheduled with G.I.  (09/02/2006)  Other Screening   Pap smear: normal   (01/13/2007)   Pap smear action/deferral: patient defers to GYN provider  (09/02/2006)    Mammogram: ASSESSMENT: Negative - BI-RADS 1^MM DIGITAL SCREENING  (02/03/2009)   Mammogram action/deferral: mammogram ordered  (08/11/2006)    DXA bone density scan: Normal  (08/19/2006)   Smoking status: never  (09/21/2009)  Lipids   Total Cholesterol: 200  (02/11/2008)   LDL: 113  (02/11/2008)   LDL Direct: Not documented   HDL: 59  (02/11/2008)   Triglycerides: 142  (02/11/2008)    SGOT (AST): 13  (02/11/2008)   SGPT (ALT): <8 U/L  (02/11/2008)   Alkaline phosphatase: 42  (02/11/2008)   Total bilirubin: 0.3  (02/11/2008)  Hypertension   Last Blood Pressure: 159 / 81  (09/21/2009)   Serum creatinine: 2.08  (09/06/2008)   Serum potassium 4.2  (09/06/2008)  Self-Management Support :    Patient will work on the following items until the next clinic visit to reach self-care goals:     Medications and monitoring: take my medicines every day, check my blood pressure, bring all of my medications to every visit, weigh myself weekly  (09/21/2009)     Eating: eat more vegetables, use fresh or frozen vegetables, eat foods that are low in salt, eat fruit for snacks and desserts, limit or avoid alcohol  (09/21/2009)     Activity: take a 30 minute walk every day, take the stairs instead of the elevator  (09/21/2009)    Hypertension self-management support: Education handout, Written self-care plan, Resources for patients handout  (09/21/2009)   Hypertension self-care plan printed.   Hypertension education handout printed    Lipid self-management support: Education handout, Written self-care plan, Resources for patients handout  (09/21/2009)   Lipid self-care plan printed.   Lipid education handout printed      Resource handout printed.  Appended Document: ACUTE-HEAD HURTING AND DIZZY/CFB(RIOFRIO)/CFB OT fitted pt with large sling for left side per Dr Redmond Pulling.

## 2010-08-07 NOTE — Progress Notes (Signed)
Summary: refill/ hla  Phone Note Refill Request Message from:  Patient on August 17, 2009 9:04 AM  Refills Requested: Medication #1:  NORVASC 10 MG TABS Take 1 tablet by mouth once a day   Last Refilled: 1/3 Initial call taken by: Freddy Finner RN,  August 17, 2009 9:05 AM  Follow-up for Phone Call        per last OV note she needs an appt for BP check. Follow-up by: Rhea Pink  DO,  August 22, 2009 10:56 AM    Prescriptions: NORVASC 10 MG TABS (AMLODIPINE BESYLATE) Take 1 tablet by mouth once a day  #30 Tablet x 0   Entered by:   Rhea Pink  DO   Authorized by:   [name]   Signed by:   Rhea Pink  DO on 08/22/2009   Method used:   Electronically to        [facility] (retail)       [address]       [place], [place]  [id number]       Ph: [phone]       Fax: [phone]   RxID:   [id number]

## 2010-08-07 NOTE — Miscellaneous (Signed)
Summary: [facility]   Imported By: Garlan Fillers 06/04/2010 13:40:50  _____________________________________________________________________  External Attachment:    Type:   Image     Comment:   External Document

## 2010-08-07 NOTE — Letter (Signed)
Summary: Generic Letter  [facility]  [address]   [place], [place] [address]   Phone: [phone]  Fax: [phone]    11/06/2009  [facility] [id number] [place], [place]  [address]  Dear Ms. Schwegel,   This letter is to inform you that your kidney results at your last office visit were abnormal.  We have tried many times to get in touch with you about this.  Please call our office for an appointment to discuss this further.         Sincerely,   Junius Finner  MD

## 2010-08-07 NOTE — Progress Notes (Signed)
----   Converted from flag ---- ---- 05/02/2010 8:41 AM, Morrison Old RN wrote: Thanks  ---- 05/01/2010 11:53 AM, Enedina Finner wrote: Called patient home and left her message to call the clinic and make an appointment ASAP.  The gentlemen I spoke with said he would have her call as soon as she gets home.  ---- 05/01/2010 11:28 AM, Morrison Old RN wrote: [name], [name] needs an appt. to cont refills per Dr. Jerelene Redden.  Thanks ------------------------------

## 2010-08-07 NOTE — Assessment & Plan Note (Signed)
Summary: Refill on B/P med Nancy [name])   Vital Signs:  Patient profile:   70 year old female Height:      64 inches Weight:      212.8 pounds BMI:     36.66 Temp:     97.7 degrees F oral Pulse rate:   97 / minute BP sitting:   176 / 93  (right arm)  Vitals Entered By: Silverio Decamp NT II (May 14, 2010 1:55 PM) CC: BLOOD PRESSURE CHECK-  RIGHT KNEE PAIN Is Patient Diabetic? No Pain Assessment Patient in pain? yes     Location: right knee Intensity: 10 Type: aching Onset of pain  since this summer Nutritional Status BMI of > 30 = obese  Have you ever been in a relationship where you felt threatened, hurt or afraid?No   Does patient need assistance? Functional Status Self care Ambulation Normal   Primary Care Provider:  [name]  CC:  BLOOD PRESSURE CHECK-  RIGHT KNEE PAIN.  History of Present Illness: 70 y/o woman with PMH significant for DJD and HTN comes to the clinic for chief complaint of R knee pain. She has chronic knee pain that has been progressively getting worse since these summers but she has been tolerating it, was lazy to come to the clinic.  She describes her pain today as sharp, 10/10  non radiating, hot bath makes it better.  She is asking for a shot as it makes her feel better . The last shot that she got was in Dec 2010.  Preventive Screening-Counseling & Management  Alcohol-Tobacco     Smoking Status: never  Caffeine-Diet-Exercise     Does Patient Exercise: yes     Type of exercise: walking     Times/week: 0  Allergies (verified): 1)  ! Vicodin  Physical Exam  General:  alert, well-developed, well-nourished, and well-hydrated.   Head:  normocephalic and atraumatic.   Eyes:  vision grossly intact, pupils equal, pupils round, and pupils reactive to light.   Neck:  supple, full ROM, and no masses.   Lungs:  normal respiratory effort, no intercostal retractions, no accessory muscle use, normal breath sounds, no dullness, no fremitus,  no crackles, and no wheezes.   Heart:  normal rate, regular rhythm, no murmur, no gallop, no rub, and no JVD.   Abdomen:  soft, non-tender, normal bowel sounds, no distention, no masses, no guarding, and no rigidity.   Msk:  normal ROM, no joint tenderness, no joint swelling, no joint warmth, no redness over joints, and no joint deformities.   Pulses:  2+pulses b/l Extremities:  no cyanosis, no clubbing Neurologic:  alert & oriented X3, cranial nerves II-XII intact, strength normal in all extremities, sensation intact to light touch, gait normal, and DTRs symmetrical and normal.     Impression & Recommendations:  Problem # 1:  DEGENERATIVE JOINT DISEASE, RIGHT KNEE (ICD-715.96) Assessment Deteriorated She reported 10/10 sharp knee pain and requested for a knee short. She got her last shot in Dec 2010. Following the sterile procedure techniques and under the guidance and supervision of Dr. Stann Mainland , she was given 40 mg of Kenalog on the medial aspect of her R knee. She tolerated the procedure  well without any bleeding.She was adviced to take tylenol if her injections site hurts. Her updated medication list for this problem includes:    Tylenol Extra Strength 500 Mg Tabs (Acetaminophen) .Marland Kitchen... Take two tabs by mouth every 8 hours.  Problem # 2:  HYPERTENSION (  ICD-401.9) Assessment: Deteriorated Her BP was running high and also taking into consideration the previous readings, the dose of her catapres was incresed o 0.2 mg two times a day.  Her updated medication list for this problem includes:    Norvasc 10 Mg Tabs (Amlodipine besylate) .Marland Kitchen... Take 1 tablet by mouth once a day    Catapres 0.1 Mg Tabs (Clonidine hcl) .Marland Kitchen... Take 2 tablet by mouth two times a day  Problem # 3:  RENAL INSUFFICIENCY (ICD-588.9) Assessment: Deteriorated Given her deteriorating renal function, she was adviced to get BMP checked today. Orders: T-Basic Metabolic Panel (99991111)  Complete Medication List: 1)   Norvasc 10 Mg Tabs (Amlodipine besylate) .... Take 1 tablet by mouth once a day 2)  Famotidine 40 Mg Tabs (Famotidine) .... Take 1 tablet by mouth once a day 3)  Miralax Powd (Polyethylene glycol 3350) .... Take the powder once daily mixed with water as needed for at least one bowel movement, every 2 days. 4)  Catapres 0.1 Mg Tabs (Clonidine hcl) .... Take 2 tablet by mouth two times a day 5)  Tylenol Extra Strength 500 Mg Tabs (Acetaminophen) .... Take two tabs by mouth every 8 hours.  Patient Instructions: 1)  Please take your medicines as prescribed. 2)  Please take 2 tablets of catapres twice daily. 3)  Please f/u with the clinic with the next available appointment. 4)  Please take tyelonol if it hurts at your injection site. 5)  You need to lose weight. Consider a lower calorie diet and regular exercise.  6)  Limit your Sodium (Salt).   Orders Added: 1)  Est. Patient Level IV [id number] 2)  T-Basic Metabolic Panel 0000000    Process Orders Check Orders Results:     [facility]: Check successful Tests Sent for requisitioning (May 15, 2010 5:15 PM):     05/14/2010: [facility] -- T-Basic Metabolic Panel 0000000 (signed)     Prevention & Chronic Care Immunizations   Influenza vaccine: Fluvax 3+  (04/14/2009)   Influenza vaccine deferral: Deferred  (05/14/2010)    Tetanus booster: Not documented   Td booster deferral: Deferred  (05/14/2010)    Pneumococcal vaccine: Not documented    H. zoster vaccine: Not documented  Colorectal Screening   Hemoccult: Not documented    Colonoscopy: Not documented   Colonoscopy action/deferral: scheduled with G.I.  (09/02/2006)  Other Screening   Pap smear: normal  (01/13/2007)   Pap smear action/deferral: patient defers to GYN provider  (09/02/2006)    Mammogram: ASSESSMENT: Negative - BI-RADS 1^MM DIGITAL SCREENING  (02/22/2010)   Mammogram action/deferral: mammogram ordered   (08/11/2006)    DXA bone density scan: Normal  (08/19/2006)   Smoking status: never  (05/14/2010)  Lipids   Total Cholesterol: 200  (02/11/2008)   LDL: 113  (02/11/2008)   LDL Direct: Not documented   HDL: 59  (02/11/2008)   Triglycerides: 142  (02/11/2008)    SGOT (AST): 13  (02/11/2008)   SGPT (ALT): <8 U/L  (02/11/2008)   Alkaline phosphatase: 42  (02/11/2008)   Total bilirubin: 0.3  (02/11/2008)  Hypertension   Last Blood Pressure: 176 / 93  (05/14/2010)   Serum creatinine: 2.39  (09/21/2009)   Serum potassium 4.1  (09/21/2009)    Hypertension flowsheet reviewed?: Yes   Progress toward BP goal: Deteriorated  Self-Management Support :    Hypertension self-management support: Education handout, Written self-care plan, Resources for patients handout  (09/21/2009)    Lipid self-management support: Education  handout, Written self-care plan, Resources for patients handout  (09/21/2009)

## 2010-08-07 NOTE — Progress Notes (Signed)
Summary: Refill on B/P  medication  Phone Note Call from Patient   Caller: Patient Call For: Eduardo Osier DO Summary of Call: Call from pt said that she received your message about her medication.  Has an appointment for December.  Pt has been out of meds for a couple of days.  Pt was given an appointment for Monday at 3:00 PM. Sander Nephew RN  May 08, 2010 2:41 PM    Follow-up for Phone Call        Thank you.  Please inform pt that refills of her BP meds and famotidine have been sent to her [facility].  Thank you! Follow-up by: Eduardo Osier DO,  May 09, 2010 1:11 PM    Prescriptions: CATAPRES 0.1 MG TABS (CLONIDINE HCL) Take 1 tablet by mouth two times a day  #60 x 6   Entered and Authorized by:   Eduardo Osier DO   Signed by:   Eduardo Osier DO on 05/09/2010   Method used:   Electronically to        [facility] (retail)       [place], [place]  [id number]       Ph: [phone]       Fax: [phone]   [id number] FAMOTIDINE 40 MG TABS (FAMOTIDINE) Take 1 tablet by mouth once a day  #30 Tablet x 6   Entered and Authorized by:   Eduardo Osier DO   Signed by:   Eduardo Osier DO on 05/09/2010   Method used:   Electronically to        [facility] (retail)       [place], [place]  [id number]       Ph: [phone]       Fax: [phone]   [id number] NORVASC 10 MG TABS (AMLODIPINE BESYLATE) Take 1 tablet by mouth once a day  #30 Tablet x 6   Entered and Authorized by:   Eduardo Osier DO   Signed by:   Eduardo Osier DO on 05/09/2010   Method used:   Electronically to        [facility] (retail)       [address]       [place], [place]  [id number]       Ph: [phone]       Fax: [phone]   [id number]

## 2010-08-07 NOTE — Miscellaneous (Signed)
Summary: HIPAA Restrictions  HIPAA Restrictions   Imported By: Bonner Puna 09/06/2008 12:14:00  _____________________________________________________________________  External Attachment:    Type:   Image     Comment:   External Document

## 2010-08-07 NOTE — Progress Notes (Signed)
----   Converted from flag ---- ---- 11/06/2009 11:06 AM, Morrison Old RN wrote: Done.  ---- 11/06/2009 10:00 AM, Junius Finner  MD wrote: Harl Favor you mind sending her this letter when you get a chance.  She has no-showed to her last 2 appts and needs to know her labs were abnormal.  Hopefully the address we have is accurate. Thanks, Mateo Flow ------------------------------

## 2010-08-07 NOTE — Progress Notes (Signed)
  Phone Note Outgoing Call   Summary of Call: I have called patient on multiple occasions to let her know about her tests and her phone has been disconnected.  She has also not shown up to her two appointments.  I will plan on writing her a letter and mailing it to her letting her know about her abnormal results and need to f/u in our clinic. Initial call taken by: Junius Finner  MD,  Nov 06, 2009 9:58 AM

## 2010-08-07 NOTE — Progress Notes (Signed)
Summary: phone note/gp  Phone Note Outgoing Call   Summary of Call: Pt. was called by [facility] to f/u on pt.'s symptoms per Dr. Redmond Pulling.  I talked to her.  Pt. c/o headache and dizziness which started this morning.  And c/o left shoulder pain which radiates to the elbow that started 2 weeks ago. No neck/back pain. Pt. does have a hx of CVA.  She said she ran out of her BP med.this morning. I will talk to the Attending. Initial call taken by: Morrison Old RN,  September 21, 2009 10:29 AM  Follow-up for Phone Call        I talked to Dr. Stann Mainland; he said pt. can be seen in the clinic today. Follow-up by: Morrison Old RN,  September 21, 2009 10:31 AM  Additional Follow-up for Phone Call Additional follow up Details #1::        I called the pt. back; instructed her to keep the appt. today.  But if her symptoms become worse, she needs to go to the ED. Additional Follow-up by: Morrison Old RN,  September 21, 2009 10:35 AM

## 2010-10-23 LAB — BASIC METABOLIC PANEL
BUN: 12 mg/dL (ref 6–23)
Calcium: 8.7 mg/dL (ref 8.4–10.5)
Chloride: 104 mEq/L (ref 96–112)
Chloride: 105 mEq/L (ref 96–112)
Creatinine, Ser: 2.07 mg/dL — ABNORMAL HIGH (ref 0.4–1.2)
GFR calc Af Amer: 29 mL/min — ABNORMAL LOW (ref >60)
GFR calc Af Amer: 29 mL/min — ABNORMAL LOW (ref >60)
GFR calc non Af Amer: 24 mL/min — ABNORMAL LOW (ref >60)
GFR calc non Af Amer: 24 mL/min — ABNORMAL LOW (ref >60)
Potassium: 3.7 mEq/L (ref 3.5–5.1)
Potassium: 3.7 mEq/L (ref 3.5–5.1)
Sodium: 139 mEq/L (ref 135–145)

## 2010-10-23 LAB — DIFFERENTIAL
Basophils Absolute: 0 10*3/uL (ref 0.0–0.1)
Basophils Relative: 0 % (ref 0–1)
Eosinophils Absolute: 0 10*3/uL (ref 0.0–0.7)
Neutro Abs: 6.3 10*3/uL (ref 1.7–7.7)
Neutrophils Relative %: 85 % — ABNORMAL HIGH (ref 43–77)

## 2010-10-23 LAB — HIV ANTIBODY (ROUTINE TESTING W REFLEX): HIV: NONREACTIVE

## 2010-10-23 LAB — RENAL FUNCTION PANEL
CO2: 24 mEq/L (ref 19–32)
Chloride: 102 mEq/L (ref 96–112)
Creatinine, Ser: 2.17 mg/dL — ABNORMAL HIGH (ref 0.4–1.2)
GFR calc Af Amer: 28 mL/min — ABNORMAL LOW (ref >60)
GFR calc non Af Amer: 23 mL/min — ABNORMAL LOW (ref >60)
Sodium: 133 mEq/L — ABNORMAL LOW (ref 135–145)

## 2010-10-23 LAB — URINE MICROSCOPIC-ADD ON

## 2010-10-23 LAB — CBC
HCT: 31.1 % — ABNORMAL LOW (ref 36.0–46.0)
HCT: 38.8 % (ref 36.0–46.0)
Hemoglobin: 11.2 g/dL — ABNORMAL LOW (ref 12.0–15.0)
Hemoglobin: 11.3 g/dL — ABNORMAL LOW (ref 12.0–15.0)
Hemoglobin: 13 g/dL (ref 12.0–15.0)
MCHC: 33.5 g/dL (ref 30.0–36.0)
MCHC: 34.5 g/dL (ref 30.0–36.0)
MCV: 87.1 fL (ref 78.0–100.0)
MCV: 88.4 fL (ref 78.0–100.0)
Platelets: 170 10*3/uL (ref 150–400)
Platelets: 176 10*3/uL (ref 150–400)
RBC: 3.58 MIL/uL — ABNORMAL LOW (ref 3.87–5.11)
RBC: 3.78 MIL/uL — ABNORMAL LOW (ref 3.87–5.11)
RBC: 4.4 MIL/uL (ref 3.87–5.11)
RDW: 14.3 % (ref 11.5–15.5)
WBC: 3.6 10*3/uL — ABNORMAL LOW (ref 4.0–10.5)
WBC: 4.7 10*3/uL (ref 4.0–10.5)

## 2010-10-23 LAB — LIPASE, BLOOD: Lipase: 28 U/L (ref 11–59)

## 2010-10-23 LAB — COMPREHENSIVE METABOLIC PANEL
ALT: 9 U/L (ref 0–35)
Alkaline Phosphatase: 61 U/L (ref 39–117)
BUN: 26 mg/dL — ABNORMAL HIGH (ref 6–23)
CO2: 27 mEq/L (ref 19–32)
GFR calc non Af Amer: 21 mL/min — ABNORMAL LOW (ref >60)
Glucose, Bld: 116 mg/dL — ABNORMAL HIGH (ref 70–99)
Potassium: 4.6 mEq/L (ref 3.5–5.1)
Sodium: 143 mEq/L (ref 135–145)

## 2010-10-23 LAB — URINALYSIS, ROUTINE W REFLEX MICROSCOPIC
Leukocytes, UA: NEGATIVE
Nitrite: NEGATIVE
Protein, ur: >300 mg/dL — AB
Urobilinogen, UA: 1 mg/dL (ref 0.0–1.0)

## 2010-10-23 LAB — URINE CULTURE

## 2010-10-23 LAB — CREATININE CLEARANCE, URINE, 24 HOUR
Collection Interval-CRCL: 24 hours
Creatinine Clearance: 34 mL/min — ABNORMAL LOW (ref 75–115)
Creatinine, 24H Ur: 1000 mg/d (ref 700–1800)

## 2010-10-23 LAB — C-REACTIVE PROTEIN: CRP: 1 mg/dL — ABNORMAL HIGH (ref <0.6)

## 2010-10-23 LAB — SEDIMENTATION RATE: Sed Rate: 18 mm/hr (ref 0–22)

## 2010-10-23 LAB — RAPID URINE DRUG SCREEN, HOSP PERFORMED
Amphetamines: NOT DETECTED
Benzodiazepines: NOT DETECTED
Cocaine: POSITIVE — AB
Tetrahydrocannabinol: NOT DETECTED

## 2010-10-23 LAB — HEMOCCULT GUIAC POC 1CARD (OFFICE): Fecal Occult Bld: NEGATIVE

## 2010-10-23 LAB — BRAIN NATRIURETIC PEPTIDE: Pro B Natriuretic peptide (BNP): 44.1 pg/mL (ref 0.0–100.0)

## 2010-11-20 NOTE — Consult Note (Signed)
NAME:  Nancy [name], Nancy [name]               ACCOUNT NO.:  [id number]   MEDICAL RECORD NO.:  [id number]           PATIENT TYPE:   LOCATION:                                 FACILITY:   PHYSICIAN:  Tory Emerald. Benson Norway, MD    DATE OF BIRTH:  October 08, 1940   DATE OF CONSULTATION:  11/02/2007  DATE OF DISCHARGE:                                 CONSULTATION   REASON FOR CONSULTATION:  Abnormal CT scan and abdominal pain.   HISTORY OF PRESENT ILLNESS:  This is a 70 year old female with a past  medical history of hypertension, left ventricular hypertrophy,  gastroesophageal reflux disease, renal insufficiency, diverticulosis,  colonic tubular adenoma, hypertension, status post hysterectomy, and  gastroesophageal reflux disease who was admitted to the hospital with  complaints of acute abdominal pain.  The patient states that the pain  started acutely this past Saturday and progressively worsened.  In the  past, she had similar complaints of abdominal pain, however, for reasons  are not clear, and the pain had resolved spontaneously at that time.  The CT scan did reveal evidence of thickened small bowel with no  involvement of the terminal ileum.  The patient denies having nausea,  vomiting, diarrhea, hematochezia, or melena.  She denies starting any  new medications, and she is uncertain of any precipitating event for the  cause of her current symptoms.  Because of the severe pain, she  presented to the emergency room for further evaluation treatment.  Subsequently, she was admitted because the CT scan revealed a more  extensive involvement of her small bowel in regards to the thickening  that was noted 1 year ago.   PAST MEDICAL AND SURGICAL HISTORY:  As stated above.   FAMILY HISTORY:  Noncontributory.   ALLERGIES:  Allergies to VICODIN.   HOME MEDICATIONS:  Amlodipine, lisinopril, Prilosec, Travatan,  hyoscyamine, metoprolol, and Tylenol.   SOCIAL HISTORY:  Negative for alcohol, tobacco, or  illicit drug use.  The patient currently lives with her husband.   REVIEW OF SYSTEMS:  As stated above in the history present illness,  otherwise negative.   PHYSICAL EXAMINATION:  VITAL SIGNS:  Blood pressure is 132/66, heart  rate is 88, respirations 20, and temperature is 97.4.  GENERAL:  The patient is in no acute distress, although she is  uncomfortable with palpation of the abdomen.  HEENT:  Normocephalic and atraumatic.  Extraocular muscles are intact.  NECK:  Supple.  No lymphadenopathy.  LUNGS:  Clear to auscultation bilaterally.  CARDIOVASCULAR:  Regular rate and rhythm.  ABDOMEN:  Obese, soft, tender in the mid and lower abdomen.  No rebound  or rigidity.  Positive bowel sounds.  EXTREMITIES:  No clubbing, cyanosis, or edema.   Laboratory values, white blood cell count is 4.6, hemoglobin 11.1, and  platelets at 208.  PT is 14.0, INR 1.1, and PTT is 26.  Sodium is 140,  potassium 2.6, chloride is 110, CO2 of 26, glucose 108, BUN 16, and  creatinine 1.9.  Lactic acid is 1.5.  Amylase is 46.   IMPRESSION:  Abnormal CT scan revealing a  small bowel inflammatory  process.  The patient previously had this type of finding in the past,  although for reasons there again are not clear.  She had a spontaneous  resolution of her symptoms, not had any recurrence since this time.  The  previous CT scan did reveal involvement of her small bowel with this  inflammatory process, however, during this event, the segment is much  longer.  The patient does not appear to have an ischemic etiology at  this time, and clinically, she is stable and her lactic acid level is  within normal range.  The other possibilities can include Crohn's  disease of the small bowel, an infectious etiology, and carcinoid of the  small bowel.  An optimal examination will be a CT enterography at this  time given her renal insufficiency would most likely be prohibitive and  that she recently received the contrast for  her prior study.  For her  current CT scan study, a capsule study can be performed; however, I am  concerned that it could be lodged within the thickened small-bowel  segments.   PLAN:  1. Continue with ciprofloxacin and Flagyl.  2. Pain control.  3. I will discuss with radiology the possibility of performing CT      enterography or other imaging modalities that can help to elucidate      this thickened segment of small bowel.  4. There exists the possibility of carcinoma, and therefore, a 5-HIAA      will be obtained for further evaluation.      Tory Emerald Benson Norway, MD  Electronically Signed     PDH/MEDQ  D:  11/02/2007  T:  11/03/2007  Job:  [id number]

## 2010-11-20 NOTE — Op Note (Signed)
NAME:  Nancy [name], Nancy [name]               ACCOUNT NO.:  [id number]   MEDICAL RECORD NO.:  [id number]          PATIENT TYPE:  OBV   LOCATION:  [id number]                         FACILITY:  [facility]   PHYSICIAN:  Tory Emerald. Benson Norway, MD    DATE OF BIRTH:  1941/03/02   DATE OF PROCEDURE:  01/12/2008  DATE OF DISCHARGE:  01/12/2008                               OPERATIVE REPORT   PROCEDURE:  Manual disimpaction in the Endoscopy Unit.   For the patient's request, she desired to have sedation further  disimpaction.  Apparently, the patient had undergone an attempted  disimpaction in the emergency room and this was caused a significant  amount of rectal irritation.  The patient was brought down to the  Endoscopy Unit and she was provided with fentanyl 50 mcg 1 IV and Versed  4 mg IV.  A 15 mL of 2% viscous lidocaine was applied directly to the  anus.  At that point after awaiting 5 minutes for the lidocaine to take  effect, the disimpaction started a large and thick amount of solid stool  was palpated in the rectal vault with persistence of jejunal impaction.  This rectum was able to be disimpacted further palpation in the  rectosigmoid region did not feel to palpate any further solid stool at  that point.  Subsequently, the patient tolerated the procedure well and  at that point she was then brought to the Recovery Unit and recovery in  good condition.   PLAN:  At this time is to start the patient on MiraLax 17 g 1 p.o.  b.i.d. until good effect and then she can drop down to one 17 grams p.o.  daily.      Tory Emerald Benson Norway, MD  Electronically Signed     PDH/MEDQ  D:  01/12/2008  T:  01/12/2008  Job:  [id number]

## 2010-11-20 NOTE — Discharge Summary (Signed)
NAME:  Nancy [name], Nancy [name]               ACCOUNT NO.:  [id number]   MEDICAL RECORD NO.:  [id number]          PATIENT TYPE:  OBV   LOCATION:  [id number]                         FACILITY:  [facility]   PHYSICIAN:  Evette Doffing, M.D.  DATE OF BIRTH:  11-Nov-1940   DATE OF ADMISSION:  01/11/2008  DATE OF DISCHARGE:  01/12/2008                               DISCHARGE SUMMARY   DISCHARGE DIAGNOSES:  1. Fecal impaction.  2. Hypertension.  3. History of cerebrovascular accident with cerebellar hemorrhage in      June 2009.  4. Chronic renal insufficiency with baseline creatinine of 1.7.  5. Hyperlipidemia.  6. Gastroesophageal reflux disease.  7. Diverticulosis.  8. Hemorrhoids.  9. Tubular adenomas status post resection in 2008.  10.Osteoarthritis.  11.History of hysterectomy secondary to fibroids.  12.History of cholecystectomy in April 2009.   DISCHARGE MEDICATIONS:  1. Norvasc 10 mg p.o. daily.  2. Depakote 500 mg p.o. daily.  3. Lisinopril 20 mg p.o. daily.  4. Lopressor 25 mg p.o. b.i.d.  5. Pepcid 40 mg p.o. daily.  6. Tramadol 50 mg p.o. q. 6 hours p.r.n. for pain.  7. Travatan eye drops.  8. MiraLax 17 mg p.o. b.i.d.   DISPOSITION AND FOLLOWUP:  The patient is to follow up with Dr. Patrice Paradise at  the [facility] on January 28, 2008 at 1:50 p.m.  At this  time, the patient should be assessed and asked about her bowel regimen  and whether or not she has had any further problems with constipation.   PROCEDURES PERFORMED:  None.   CONSULTATIONS:  Dr. Benson Norway from Gastroenterology.   BRIEF ADMITTING HISTORY AND PHYSICAL:  The patient is a pleasant 70-year-  old African American female with past medical history of cerebrovascular  accident, hypertension, renal insufficiency, diverticulosis, hemorrhoid,  history of tubular adenoma status post resection in 2008, who presents  to the Emergency Room with approximately 4 weeks of constipation.  The  patient has not had any good bowel  movements for about 1 month prior to  admission and states that her normal bowel regimen included 1 bowel  motion a day.  Her abdomen is bloated, but denies any nausea or  vomiting, but states that she has been having periumbilical pain  associated with the desire to have a bowel movement, but is unable to do  so and passes only a small amount of loose liquid stool every time she  has tried to have a bowel movement in the recent past.  The patient is  eating normally and denies taking any opioids, although she is on  tramadol for a headache that started after her cerebrovascular accident.  The patient also takes Norvasc for her blood pressure.  She also noticed  bright red bleeding mixed with stool today after straining.  This  occurred while she was in the emergency department as well.  The patient  denies any hematemesis, hemoptysis, or abnormal bleeding from any other  place.   PHYSICAL EXAMINATION:  VITAL SIGNS:  The patient had the following vital  signs:  Temperature 97.5, blood pressure 154/93, pulse  of 88,  respiratory rate of 16, oxygen saturation of 97% on room air.  She was  not found to be orthostatic upon admission.  GENERAL:  She was alert and oriented x3 and was in no acute distress.  HEENT:  Showed extraocular motions intact with pupils equal, round, and  reactive to light.  Oropharynx and oral cavity were clear.  NECK:  Supple without any JVD.  RESPIRATORY:  Clear to auscultation bilaterally.  CARDIOVASCULAR:  Revealed regular rate and rhythm and a 2/4 systolic  murmur that was heard loudest in the parasternal area.  GI:  Showed possible bowel sounds and distended, but soft and nontender  abdomen.  EXTREMITIES:  Showed no clubbing, cyanosis, or edema.  NEURO:  Showed cranial nerves II through XII intact and no focal motor  or sensory deficits.   LABORATORY DATA:  Labs upon admission showed a white blood cell count of  7, hemoglobin of 13.3, and a platelet count of  198,000 with MCV of 88,  and an ANC of 4.9.  Potassium was 140, sodium was 4.4, chloride of 105,  bicarbonate of 27, BUN of 25, creatinine of 1.68, glucose of 111,  bilirubin of 1.2, alk phos of 41, AST of 22, ALT of 28, protein of 7.3,  albumin of 3.6, calcium of 9.0, and a lipase of 47.   HOSPITAL COURSE:  1. Fecal impaction.  Upon arrival to the emergency department, the      patient underwent a tap water enema and an attempted manual      disimpaction, which were both unsuccessful.  An abdominal series      was unremarkable and showed nondistended gas-filled loops of small      bowel with stool noted in the colon.  Upon admission, the patient's      home tramadol dose was held and she denied any recent use of      opioids.  In addition, the patient's serum calcium and TSH were      found to be normal.  Dr. Benson Norway was consulted and attempted another      manual disimpaction later that night, but was unsuccessful.  The      following day, the patient was taken to the endoscopy suite where a      third manual disimpaction was attempted under anesthesia, which was      ultimately successful.  The patient was subsequently discharged      home later that evening in stable condition.  Prior to discharge,      the patient was started on MiraLax and instructed to dissolve 70 g      into any liquid and drink by mouth twice daily until first good      bowel movement, then to decrease to just once a day thereafter.  2. Bright red blood per rectum.  The patient was FOBT positive in ED.      This was most likely secondary to a combination of the patient's      hemorrhoids and manual disimpaction attempts.  This was expected to      resolve on its own, thus no active intervention was made at the      time other than a close monitoring of her hemoglobin, which      remained stable throughout hospitalization.  The patient was also      rehydrated liberally with IV normal saline.   DISCHARGE VITALS  AND LABS:  Upon discharge, the patient had a  temperature  of 99.3, blood pressure 141/80, pulse of 81, respiratory  rate of 20, and oxygen saturation 95% on room air.  White blood cell  count was 4.6, hemoglobin of 11.5, and platelets of 196.  Sodium of 138,  potassium of 4.2, chloride of 107, bicarbonate of 25, BUN of 16,  creatinine of 1.4, glucose of 93, calcium of 8.6, and TSH of 3.1.      Stephanie Coup, MD  Electronically Signed      Evette Doffing, M.D.  Electronically Signed    RK/MEDQ  D:  01/14/2008  T:  01/15/2008  Job:  [id number]   cc:   Jacolyn Reedy, M.D.

## 2010-11-20 NOTE — Discharge Summary (Signed)
NAME:  Nancy [name], Nancy [name]               ACCOUNT NO.:  [id number]   MEDICAL RECORD NO.:  [id number]          PATIENT TYPE:  OBV   LOCATION:  3015                         FACILITY:  [facility]   PHYSICIAN:  Leonel Ramsay, MD DATE OF BIRTH:  [birth year]   DATE OF ADMISSION:  12/25/2007  DATE OF DISCHARGE:  12/29/2007                               DISCHARGE SUMMARY   DISCHARGE DIAGNOSES:  1. Cerebellar hemorrhage, diagnosed on December 12, 2007.  2. Recurrent headaches, post cerebellar hemorrhage.  3. Hypertension.  4. Orthostatic hypotension.  5. Chronic renal insufficiency with baseline of 1.6-1.8.  6. Hyperlipidemia.  7. Gastroesophageal reflux disease.  8. Partial blindness.  9. Osteoarthritis.  10.Diverticulosis.  11.History of colitis.  12.History of symptomatic gallstones, status post cholecystectomy.  13.History of pulmonary nodule.  14.History of anemia.  15.History of hemorrhoids.  16.History of uterine fibroids, status post hysterectomy.   DISCHARGE MEDICATIONS:  1. Norvasc 10 mg p.o. daily.  2. Depakote 500 mg p.o. daily.  3. Lisinopril 20 mg p.o. daily.  4. Lopressor 25 mg p.o. b.i.d.  5. Pepcid 40 mg p.o. daily.  6. Tylenol arthritis p.r.n.  7. Travatan eye drops at home regimen.  8. Tramadol 50 mg p.o. q.6 hours p.r.n.   DISPOSITION AND FOLLOWUP:  The patient is to follow up with Dr. Venia Carbon  in the outpatient clinic on January 13, 2008, at 2 p.m.  At that time, her  blood pressure needs to be checked sitting, standing, and adjustments in  her medications may need to be made.  The patient is also to follow up  with her neurologist 4 weeks after discharge.   CONSULTATIONS:  No consultations were obtained.   PROCEDURES:  No procedures were performed.   ADMISSION HISTORY AND PHYSICAL:  The patient is a 70 year old African  American woman with a recently diagnosed cerebellar hemorrhage,  presenting to the emergency room for recurrent headache, nausea, and  vomiting.  She  has had recurrent headaches and associated nausea since  her cerebellar hemorrhage diagnosed on December 12, 2007.  She also had  significant dizziness since that time.  Her repeat CAT scan was negative  for any changes from the prior CT scan.   PHYSICAL EXAMINATION:  ADMISSION VITAL SIGNS:  Temperature 98.3, blood  pressure 185/92, pulse 91, respiratory rate 14, and O2 sat 98% on room  air.  GENERAL:  She is holding her head, appears uncomfortable and eyes  closed.  HEENT:  Pupils equally round and reactive to light.  Extraocular motions  intact.  Muddy sclerae.  CARDIOVASCULAR:  Regular rate and rhythm.  Systolic ejection murmur 2/6,  best heard at the right sternal border.  RESPIRATORY:  Clear to auscultation bilaterally.  ABDOMINAL:  Good bowel sounds.  Soft, nontender, and nondistended.  Obese.  EXTREMITIES:  No edema.  NEUROLOGICAL:  Cranial nerves II-XII intact, except for possible mild  left facial smoothing of the nasolabial fold, finger-to-nose markedly  abnormal bilaterally.  Babinski within normal limits bilaterally.  Muscle strength 5/5 in all four extremities.  Sensation intact.   ADMISSION LABS:  Sodium 138, potassium 4.1, chloride  104, bicarb 26, BUN  16, creatinine 1.49, glucose 109, bilirubin 0.8, alkaline phosphatase  47, AST 17, ALT 12, total protein 6.6, albumin 3.5, calcium 9.3, white  blood cell count 5.6, hemoglobin 13.2, platelet count 232, MCV 86.3, PT  12.8, INR 0.9, PTT 25, and sed rate 17.  Urinalysis, 100 protein and  otherwise within normal limits.   HOSPITAL COURSE:  1. Cerebellar hemorrhage.  The patient has presented to the emergency      room on multiple occasions since her initial diagnosis and has had      4 CAT scans of the head in the last 2 weeks.  Her cerebellar      hemorrhage has appeared stable on these repeat images.  Physical      therapy and occupational therapy were consulted and the patient was      set up for home physical therapy.  2.  Recurrent headaches, post cerebellar hemorrhage.  The patient has      had great difficulty with recurrent headaches since the initial      diagnosis of the cerebellar hemorrhage on December 12, 2007.  Repeat      imaging has been negative for any significant changes since the      initial diagnosis.  Coags were checked and were all negative.  A      sed rate was checked and was also within normal limits.  The      patient has an intolerance to Vicodin and Dr. Leonie Man in Neurology      was called and recommended starting the patient on Depakote for      headache prophylaxis at 500 mg daily.  Depakote level not need to      be monitored in this setting.  He also recommended Ultram p.r.n.      for breakthrough pain.  The patient was started on this regimen and      her headaches markedly improved throughout this hospitalization      along with her symptoms of dizziness, nausea, and vomiting.  3. Hypertension.  The patient's blood pressure needs to be      aggressively controlled secondary to #1.  The patient's dose of      metoprolol was increased and she was given clonidine p.r.n. for      elevated systolics.  Unfortunately, she became orthostatic and so      her standing blood pressure was monitored and really limited the      addition of any other agents.  On the regimen of Norvasc,      lisinopril, and metoprolol; her systolics were in the 0000000 range      prior to discharge with a standing systolic in the 123456 range.  This      will need to be followed up in the outpatient setting.  4. Orthostatic hypotension, likely secondary to multiple blood      pressure medications and possibly clonidine specifically.  A      diuretic was not added because of this.  Her symptoms of dizziness      with standing were resolved at the time of discharge.  This will      need to be followed up in the outpatient setting.  5. Chronic renal insufficiency.  Her creatinine increased after a dose      of Lasix to 2.0.   Her baseline appears to be 1.6-1.8.  When the      Lasix was stopped, her creatinine returned to normal  was stable.   DISCHARGE VITALS:  Temperature 98.5, blood pressure 153/84, heart rate  89, respiratory rate 18, and O2 sat 96% on room air.   DISCHARGE LABS:  Sodium 139, potassium 3.9, chloride 100, bicarb 30, BUN  22, creatinine 1.69, glucose 89, and calcium 9.2.      Junius Finner, MD  Electronically Signed      Leonel Ramsay, MD  Electronically Signed    VW/MEDQ  D:  12/31/2007  T:  12/31/2007  Job:  [id number]

## 2010-11-20 NOTE — Discharge Summary (Signed)
NAME:  Nancy [name], Nancy [name]               ACCOUNT NO.:  [id number]   MEDICAL RECORD NO.:  [id number]          PATIENT TYPE:  INP   LOCATION:  3017                         FACILITY:  [facility]   PHYSICIAN:  Ophelia Charter, M.D.DATE OF BIRTH:  [birth year]   DATE OF ADMISSION:  12/12/2007  DATE OF DISCHARGE:  12/16/2007                               DISCHARGE SUMMARY   BRIEF HISTORY:  The patient is a 70 year old white female, who presented  with cerebellar hemorrhage.   For full details of this admission, please refer to history and  physical.   HOSPITAL COURSE:  Admitted the patient for observation initially to the  ICU.  We repeated CAT scan on December 13, 2007, demonstrated a small  cerebellar hemorrhage with no evidence of hydrocephalus.  The patient  was transferred to the Neurosurgical General Floor, where she underwent  physical therapy and occupational therapy.  We had the Rehab team see  her, they did not think she was appropriate for inpatient rehab and  recommended home PT/OT.   As the patient's blood pressure was elevated, we started her on  clonidine 0.1 mg p.o. t.i.d., her blood pressure was well controlled  with this medication.   By December 16, 2007, the patient was afebrile.  Vital signs were stable.  She was requesting discharge to home.  She was discharged to home on  December 16, 2007, with home PT/OT as recommended by the rehab team.   DISCHARGE INSTRUCTIONS:  The patient was given written discharge  instructions.  Instructed to follow up with me in a month and instructed  to follow up with her primary medical doctor for a blood pressure check.   FINAL DIAGNOSES:  1. Cerebellar hemorrhage.  2. Hypertension.   PROCEDURE PERFORMED:  None.      Ophelia Charter, M.D.  Electronically Signed     JDJ/MEDQ  D:  01/28/2008  T:  01/29/2008  Job:  [id number]

## 2010-11-20 NOTE — Discharge Summary (Signed)
NAME:  [place], [place]               ACCOUNT NO.:  [id number]   MEDICAL RECORD NO.:  [id number]          PATIENT TYPE:  INP   LOCATION:  3017                         FACILITY:  [facility]   PHYSICIAN:  C. Milta Deiters, M.D.DATE OF BIRTH:  Sep 05, 1940   DATE OF ADMISSION:  11/04/2007  DATE OF DISCHARGE:  11/06/2007                               DISCHARGE SUMMARY   ATTENDING PHYSICIAN:  C. Milta Deiters, MD   DISCHARGE DIAGNOSES:  1. Symptomatic cholelithiasis status post cholecystectomy.  2. Inflammatory bowel disease.  3. Hyperlipidemia.  4. Chronic renal insufficiency with baseline creatinine of 1.8.  5. Diverticulosis.  6. History of pulmonary nodule discovered in May 2005 with serial      followups.  7. History of uterine fibroids.  8. Hypertension.  9. Left ventricular hypertrophy.  10.Gastroesophageal reflux disease.  11.Acidic anemia.  12.History of internal and external hemorrhoids.  13.Status post hysterectomy.  14.Claudication.   DISCHARGE MEDICATIONS:  1. Norvasc 10 mg p.o. daily.  2. Prilosec 40 mg p.o. daily.  3. Lisinopril 20 mg p.o. daily.  4. Claritin use as directed.  5. Tylenol 650 mg p.o. at night p.r.n. pain.   FOLLOWUP:  The patient will follow up with Dr. Georgette Dover following her  surgery.  She will be followed up on regards to postsurgical course.   PROCEDURES PERFORMED:  1. On November 01, 2007, CT of the abdomen and pelvis without contrast      indicating new perihepatic ascites associated with small bowel      mesenteric process in the pelvis.  Small pericardial effusions,      which appear stable.  Small nodules in the lung bases, which appear      stable.  Gallstones are present.  There is a long segmented small      bowel wall thickening associated likely with mesenteric edema and      ascites.  There is no evidence of bowel obstruction or perforation.      No demonstrated involvement of the terminal ileum or cecum.  2. On November 04, 2007, cholangiogram  indicating contrast opacification      of the CBD, cystic duct, and contrast noted within the duodenum.      This was performed after a laparoscopic cholecystectomy.   CONSULTATIONS:  General surgery was consulted in regards to the  patient's abdominal pain for potential of having ileitis caused by  inflammatory bowel disease.  Ultimately, they performed a  cholecystectomy for the patient's suspected symptomatic cholelithiasis.   BRIEF ADMITTING HISTORY AND PHYSICAL:  This is a 70 year old female with  history of hypertension, hyperlipidemia, GERD, chronic renal  insufficiency, and an episode of ileitis with unknown cause in March  2008.  She had a colonoscopy in April 2008 that was essentially normal.  She went home and has been asymptomatic from her bowel disease ever  since.  The day prior to admission, the patient appeared having intense  sharp continuous abdominal pain.  It was accompanied by some nausea.  She did vomit once.  She endorses some chills, but no fever.  She has  not had any  melena, hematochezia, diarrhea, or constipation.  She is not  taking any mediations at home for this.   PHYSICAL EXAMINATION:  ADMITTING VITALS:  Temperature 98.5, blood  pressure 172/75, pulse 103, respirations 20, and sating 96% on room air.  GENERAL:  No acute distress.  EYES:  Muddy, but clear.  Eye movements intact.  ENT:  Oropharynx is clear without any erythema or edema.  RESPIRATORY:  Lungs are clear bilaterally with air good movement.  CARDIOVASCULAR:  Regular rate and rhythm.  No murmurs, rubs, or gallops.  GI:  Soft with hyperactive bowel sounds.  Tender in the lower quadrant  mildly, tender diffusely.  Negative rebound.  Negative Murphy's.  EXTREMITIES:  +1 nonpitting edema bilaterally.  Left lower extremity is  slightly tender.  NEURO:  Cranial nerves II-XII intact.  Muscle strength +5/5 in the lower  extremities, +4/5 in the left upper extremity and + 5/5 in the right  upper  extremity.  Sensation intact to light touch.  PSYCH:  Oriented and inappropriate.   ADMITTING LABS:  Sodium 136, potassium 4.0, chloride 102, bicarb 25, BUN  18, and creatinine 2.4.  Hemoglobin 12.4, white count 8.3, platelets  211, and ANC 7.4.  Anion gap 9, bilirubin 0.7, alk phos 49, AST 40, ALT  11, protein 6.9, albumin 3.4, calcium 8.8, and lipase 24.   HOSPITAL COURSE AND PROBLEMS:  1. Abdominal pain.  The patient had significant abdominal pain the day      of prior to admission.  She has a history of ileitis and pertinent      history for an intra-abdominal disease.  Since being diagnosed with      that, she has not had abdominal pain at all for 12 months.  A CT      scan indicated that she had what looked like edema and small bowel      wall thickening.  We ultimately consulted Gastroenterology because      of the patient's abdominal pain and question of inflammatory bowel      disease.  Because of the patient's chronic renal insufficiency, a      CT scan with contrast was not able to be done.  General Surgery was      consulted at that time and they suggested taking the patient to the      operating room and visually inspected the small bowel.  It did not      appear that she had any significant edema or inflammation of the      small or large bowel.  It did appear that the patient has      significant cholelithiasis.  A decision was made to remove the      patient's gallbladder via the laparoscopic procedure.  The patient      underwent this procedure on November 04, 2007.  She was able to be      discharged on Nov 06, 2007.  She will follow up with Dr. Georgette Dover in      regards to her postoperative course.  Her abdominal pain has      subsided following surgery, so it is a high likelihood that this      was the cause for her intense abdominal pain on admission.  2. Chronic renal insufficiency.  The patient had elevated creatinine      of 1.8.  It was unclear why she has this elevated  creatinine.  She      needs an outpatient workup to  discover the potential cause.  She      does not have diabetes.  She does have a history of hypertension,      but the causes should be ruled out if they have not already been.  3. Claudication.  The patient had claudication in the hospital.  She      is to have outpatient followup in regards to her likely peripheral      vascular disease.  The patient has began with 5 minutes of      ambulation.  4. Hypertension.  The patient's blood pressure was maintained at      acceptable levels during the course of her admission.  We did have      to hold the patient's Norvasc because of her nothing by mouth      status.  Generally, her blood pressures remain in between 130 and      150 during the course of her admission.   DISCHARGE LABS:  Sodium 137, potassium 4.2, chloride 104, bicarb 28,  glucose 95, BUN 12, and creatinine 2.18.   DISCHARGE VITALS:  Temperature 99.0, blood pressure 170/90, pulse 83,  respirations 20, and sating 93% on room air.      Caroleen Hamman, MD  Electronically Signed      C. Milta Deiters, M.D.  Electronically Signed    CB/MEDQ  D:  12/16/2007  T:  12/17/2007  Job:  [id number]   cc:   Burton Apley, M.D.  Imogene Burn. Tsuei, M.D.

## 2010-11-20 NOTE — Consult Note (Signed)
NAME:  Nancy [name], Nancy [name]               ACCOUNT NO.:  [id number]   MEDICAL RECORD NO.:  [id number]          PATIENT TYPE:  OBV   LOCATION:  [id number]                         FACILITY:  [facility]   PHYSICIAN:  Tory Emerald. Benson Norway, MD    DATE OF BIRTH:  Mar 06, 1941   DATE OF CONSULTATION:  01/11/2008  DATE OF DISCHARGE:                                 CONSULTATION   REASON FOR CONSULTATION:  Stool impaction and hematochezia.   REFERRING PHYSICIANS:  [facility].   HISTORY OF PRESENT ILLNESS:  This is a 70 year old female with a past  medical history of CVA with recent cerebellar bleed, hypertension, renal  insufficiency, diverticulosis, hemorrhoids, tubular adenomas removed in  2008, gastroesophageal reflux disease, osteoarthritis, status post  hysterectomy, and status post laparoscopic cholecystectomy in April  2009, who presented to the hospital with complaints of constipation.  The patient states that she has not had any bowel movement in 4 weeks  and she does have some overflow incontinence with liquid stool.  Overall  she feels very bloated, but no nausea, vomiting, and no significant  abdominal pain.  She states that her symptoms started shortly after  having her gallbladder removed.  She does take tramadol for headaches,  but there is no recent use of opioids.  With straining she does have  some hematochezia.   PAST MEDICAL HISTORY:  As stated above.   PAST SURGICAL HISTORY:  As stated above.   FAMILY HISTORY:  Noncontributory.   SOCIAL HISTORY:  Negative for alcohol, tobacco, or illicit drug use.   ALLERGIES:  VICODIN.   MEDICATIONS:  1. Norvasc 10 mg p.o. daily.  2. Depakote 500 mg p.o. daily.  3. Lisinopril 20 mg p.o. daily.  4. Lopressor 25 mg p.o. b.i.d.  5. Pepcid 40 mg p.o. daily.  6. Tylenol p.r.n.  7. Tramadol 50 mg one p.o. q.6 h. p.r.n.   REVIEW OF SYSTEMS:  As stated above in history of present illness,  otherwise, negative.   PHYSICAL EXAMINATION:   VITAL SIGNS:  Blood pressure 159/93, heart rate  is 88, temperature is 97.5, and pulse oxygen 97%.  GENERAL:  The patient is no acute distress, alert, and oriented.  HEENT:  Normocephalic and atraumatic.  Extraocular muscles intact.  NECK:  Supple.  No lymphadenopathy.  LUNGS:  Clear to auscultation bilaterally.  CARDIOVASCULAR:  Regular rate and rhythm.  ABDOMEN:  Obese, soft, nontender, and nondistended.  Positive bowel  sounds.  EXTREMITIES:  No clubbing, cyanosis, or edema.  RECTAL:  There is tenderness in the anus and there is a large hard stool  in the rectal vault.   LABORATORY VALUES:  Sodium is 140, potassium 4.4, chloride 105, CO2 is  27, BUN is 25, creatinine 1.6, and glucose 111.  White blood cell count  7, hemoglobin 13.3, platelets 198, MCV 38.  Total bilirubin is 1.2, alk  phos is 41, AST 22, ALT 28, albumin 3.6, lipase is 47, and calcium is  9.0.   IMPRESSION:  1. Stool impaction.  2. Constipation.  3. Hematochezia with straining.  4. Other multiple medical problems.  I did attempt a manual disimpaction at this time, however, she is quite  tender in her anus.  Apparently attempts of disimpaction had failed and  subsequently GI was requested for further assistance.   PLAN:  At this time is to provide the patient with lidocaine 2% and  apply before performing the disimpaction and if she is not able to  tolerate the disimpaction, then I will take her to the endoscopy suite  and provide sedation to perform it under sedation.      Tory Emerald Benson Norway, MD  Electronically Signed     PDH/MEDQ  D:  01/11/2008  T:  01/12/2008  Job:  [id number]   cc:   [facility]

## 2010-11-20 NOTE — Op Note (Signed)
NAME:  Nancy [name], Nancy [name]               ACCOUNT NO.:  [id number]   MEDICAL RECORD NO.:  [id number]           PATIENT TYPE:   LOCATION:                                 FACILITY:   PHYSICIAN:  Imogene Burn. Georgette Dover, M.D. DATE OF BIRTH:  1941/02/20   DATE OF PROCEDURE:  11/04/2007  DATE OF DISCHARGE:                               OPERATIVE REPORT   DATE OF OPERATION:  November 04, 2007.   PREOPERATIVE DIAGNOSIS:  1. Chronic calculus cholecystitis.  2. Small bowel inflammatory disease.   POSTOPERATIVE DIAGNOSIS:  Chronic calculus cholecystitis.   PROCEDURE PERFORMED:  1. Laparoscopic cholecystectomy with intraoperative cholangiogram.  2. Diagnostic laparoscopy with examination of small bowel.   SURGEON:  Dr. Imogene Burn. Tsuei M.D.   ASSISTANT:  Leisa Lenz. Lissa Merlin, N.P.   ANESTHESIA:  General.   INDICATIONS:  The patient is a 70 year old female who presents with her  second episode of severe abdominal pain located mostly in a lower  quadrant.  A CT scan showed what appeared to be a thickened area of mid  jejunum.  The patient is also noted to have cholelithiasis.  We were  asked to consult as she has been unable to step through diagnosis for  this small bowel inflammation.  After I examined the patient and took a  thorough history, it seems that she has been having some problems with  gallbladder symptoms for sometime.  She actually has a little bit of  right upper quadrant tenderness.   DESCRIPTION OF PROCEDURE:  The patient was brought to the operating  room, placed in the supine position on operating room table.  After  adequate level of general anesthesia was obtained, a Foley catheter was  placed in a sterile technique.  The patient's abdomen was prepped with  Betadine and draped in a sterile fashion.  A time-out was taken to  ensure the proper patient and proper procedure.  We made a small  vertical incision just below her umbilicus after infiltrating with 4%  Marcaine with  epinephrine.  The fascia was grasped with Kocher clamps  and elevated.  We divided the fascia vertically.  The peritoneal cavity  was bluntly entered.  A stay sutures of 0-Vicryl was placed around the  fascial opening.  The Hasson cannula was inserted and secured to stay  suture.  Pneumoperitoneum was obtained by insufflating CO2 maintaining  maximal pressure of 50 mmHg.  The patient was positioned in a reverse  Trendelenburg, tilted to her left.  She is fairly obese and has a lot of  intraperitoneal fat.  We required the use of a 30-degree 10-mm scope in  order to visualize the gallbladder.  An 11-mm port placed in the  subxiphoid position.  Two 5-mm ports placed in the right upper quadrant.  The gallbladder fundus was grasped with the clamp and elevated over the  edge of the liver.  There was some adhesions to the surface of the  gallbladder and these were taken down bluntly.  The peritoneum around  the hilum of the gallbladder was opened.  The gallbladder appeared  thickened and rather  whitish in appearance.  We took down some of the  peritoneal fat around the hilum of the gallbladder.  The cystic duct was  clearly identified and was dissected circumferentially.  A small opening  was created on the cystic duct.  A cholangiogram catheter was inserted  through a stab incision and threaded into the cystic duct and secured  with a clip.  A cholangiogram was obtained which showed good flow  proximally and distally in the biliary tree with no sign of obstruction.  Contrast flowed easily into the duodenum.  The catheter was removed and  the cystic duct was ligated with clips and divided.  The cystic artery  was identified and was ligated with clips and divided.  Cautery was then  used to remove the gallbladder from the liver bed.  The gallbladder was  fairly intrahepatic in location.  A couple of tiny holes were made in  the gallbladder and some bile was spilled, but no stones were noted.  The  gallbladder was placed in a EndoCatch sac.  We thoroughly irrigated  the gallbladder fossa and cautery was used for hemostasis.  We then  turned our attention to the lower part of the abdomen.  We placed the  patient in Trendelenburg, identified the appendix and the cecum.  We  began at the terminal ileum.  We ran the small bowel in retrograde  fashion.  The patient has a lot of mesenteric fat, but I could not  visualize any inflamed or thickened areas of small bowel, although small  bowel appeared normal in caliber and appearance.  There is no purulence  or inflammation noted.  Therefore, we chose not to do a small bowel  resection.  We removed the gallbladder through the umbilical port site  and closed the fascia with the purse-string suture.  We irrigated the  right upper quadrant again and suctioned as much irrigation as possible.  The trocars were removed as pneumoperitoneum was released.  A 4-0  Monocryl was used to close the skin incisions.  Steri-Strips and clean  dressings were applied.  The patient was then extubated, brought to  recovery room in stable condition.  All sponge, instrument, and needle  counts were correct.      Imogene Burn. Tsuei, M.D.  Electronically Signed     MKT/MEDQ  D:  11/04/2007  T:  11/05/2007  Job:  [id number]   cc:   Tory Emerald. Benson Norway, MD

## 2010-11-20 NOTE — Consult Note (Signed)
NAME:  [place], [place]               ACCOUNT NO.:  [id number]   MEDICAL RECORD NO.:  [id number]          PATIENT TYPE:  INP   LOCATION:  3113                         FACILITY:  [facility]   PHYSICIAN:  Ophelia Charter, M.D.DATE OF BIRTH:  1941/05/03   DATE OF CONSULTATION:  12/12/2007  DATE OF DISCHARGE:                                 CONSULTATION   CHIEF COMPLAINT:  Headache.   HISTORY OF PRESENT ILLNESS:  The patient is a 70 year old black female  who had acute onset of headache on December 09, 2007.  This was associated  with nausea and vomiting.  The patient came to [facility] on  December 11, 2007, via EMS.  She was evaluated predominantly for abdominal  pain which turned out may be she was treatment released.  The patient  has had difficulty with walking.  She has taken a few falls.  Her  headache continued.  She came back to [facility] via private  vehicle today where Dr. Edison Nasuti worked her up with a cranial CT scan which  demonstrated a cerebellar hemorrhage.  A neurosurgical consultation was  then requested.   Presently, the patient is accompanied by her husband.  She tells me her  headache is better since being given medications.  She denies chest  pain, abdominal pain, neck pain, back pain, numbness, tingling,  weakness, etc.   PAST MEDICAL HISTORY:  Positive for hypertension.  She had a bout of  cholecystitis and had a cholecystectomy in April 2009, and remote  history of inguinal hernia.   PAST SURGICAL HISTORY:  Cholecystectomy in April 2009 as above,  herniorrhaphy, and hysterectomy.   ALLERGIES:  The patient has no known drug allergies.   MEDICATION PRIOR TO ADMISSION:  1. Pepcid, unknown dose.  2. Lisinopril 20 mg p.o. daily.  3. Norvasc 10 mg p.o. daily.   FAMILY HISTORY:  The patient's mother died at age 59.  The patient's  father died at age 84 of unclear causes.   SOCIAL HISTORY:  The patient is married.  She has 1 child.  She lives in  [place].  She is not employed.  She denies tobacco, ethanol, or drug  use.   REVIEW OF SYSTEMS:  Negative except as above.   PHYSICAL EXAMINATION:  GENERAL:  A pleasant 70 year old black female in  no apparent distress.  HEENT:  Normocephalic and atraumatic.  Her pupils are equal and round  reactive to light.  She has bilateral arcus senilis.  She is edentulous.  Oropharynx is otherwise benign.  Extraocular muscles are largely intact  but occasionally there is some disconjugate gaze with upward gaze.  NECK:  Supple.  There are no masses or deformities.  She has limited  range of motion.  No deformities, tracheal deviation, etc.  Thorax is  symmetric.  LUNGS:  Clear.  HEART:  Regular rhythm.  ABDOMEN:  Soft, obese, and nontender.  EXTREMITIES:  No deformities.  BACK:  Normal.  NEUROLOGIC:  The patient is alert and oriented x3.  Glasgow Coma Scale  15.  Cranial nerves II through XII were examined bilaterally and  grossly  normal.  Vision and hearing grossly normal.  She does have occasional  disconjugate gaze as above.  Motor strength is 5/5 in biceps, triceps,  handgrip, quadriceps, gastrocnemius, extensor hallucis longus.  Sensory  function is intact to light touch sensation in all tested dermatomes  bilaterally.  Cerebellar function demonstrates dysmetria bilaterally  with finger-to-nose.  Deep tendon reflexes are 1-2/4 in the biceps,  triceps, quadriceps, gastrocnemius.   I reviewed the patient's cranial CT scan performed without contrast at  [facility] on December 12, 2007.  It demonstrates that the patient  has a moderate-sized left central intracerebral  hemorrhage.  There is  no intraventricular hemorrhage.  There is no significant mass effect in  the fourth ventricle and no hydrocephalus.   ASSESSMENT AND PLAN:  Left cerebellar hemorrhage.  Discussed in detail  with the patient and her husband.  She is doing fairly well clinically.  There is no evidence of  hydrocephalus or significant mass effect and her  bleed, as best as I can tell, 3 days ago.  Admitted her to the ICU for  observation.  Plan to repeat a CAT scan in 2 days.  We will keep her  blood pressure under control with her home medications and p.r.n.  labetalol.  I have answered all their other questions.      Ophelia Charter, M.D.  Electronically Signed     JDJ/MEDQ  D:  12/12/2007  T:  12/13/2007  Job:  [id number]

## 2010-11-20 NOTE — Consult Note (Signed)
NAME:  Nancy [name], Nancy [name]               ACCOUNT NO.:  [id number]   MEDICAL RECORD NO.:  [id number]          PATIENT TYPE:  OUT   LOCATION:  [facility]                          FACILITY:  [facility] [place]   PHYSICIAN:  Imogene Burn. Georgette Dover, M.D. DATE OF BIRTH:  1941-01-26   DATE OF CONSULTATION:  11/03/2007  DATE OF DISCHARGE:                                 CONSULTATION   TIME OF CONSULTATION:  16:15 p.m.   REQUESTING PHYSICIAN:  Tory Emerald. Benson Norway, MD   CONSULTING SURGEON:  Dr. Imogene Burn. Tsuei   PRIMARY CARE PHYSICIAN:  Dr. Burton Apley.   REASON FOR CONSULTATION:  Abdominal pain with thickened area of small  bowel seen on CT scan.   HISTORY OF PRESENT ILLNESS:  This is a 70 year old black female who was  brought to [facility] on Sunday, by EMS due to  severe abdominal pain.  The patient states that she began having pain on  Saturday night that she describes as worse than having a baby.  She  also states that she has some mild nausea as well as one episode of  emesis.  She describes this as intermittent crampy pain.  She also said  that she had an episode like this that occurred last year.  She was also  hospitalized for that as well.  On CT scan a year ago, it showed similar  findings just not as extensive.  She also had a colonoscopy about a year  ago that Dr. Benson Norway reports as being normal.  On Sunday, the patient  states that she was still having worsening pain and therefore called  EMS.  She was at that time taken to [facility] and  later transferred to [facility].  Once here, CT scan was performed which  showed some thickening of the distal jejunum as well as the proximal  ileum which was approximately 20 cm long.  She also had some fluid  surrounding this area indicating severe inflammation.  At that time, she  was admitted and started on Cipro, Flagyl, and kept NPO.  The patient,  over the past couple of days, began to improve as her pain begins to get  better as well.  All labs are currently essentially normal.  She was  started on clear liquids and has not had any problems with this.  Dr.  Benson Norway, with GI was called to see the patient.  He is checking a 5-HIAA  for a possible carcinoid syndrome.  He also then requested a surgical  consultation for a possible diagnostic laparoscopy to help determine the  etiology of the patient's problem.   REVIEW OF SYSTEMS:  See HPI.  Otherwise, the patient admits to having  glaucoma in her eyes as well as high blood pressure and renal  insufficiency.  Otherwise, all other systems are negative.  The patient  admits to having gallstones which sometimes after eating dairy products  or some greasy foods cause her to have some right upper quadrant  abdominal discomfort.   FAMILY HISTORY:  Noncontributory.   PAST MEDICAL HISTORY:  Significant for:  1.  Hypertension.  2. Normocytic anemia.  3. Cholelithiasis.  4. Uterine fibroid.  5. Pulmonary nodules.  6. Diverticulosis.  7. Renal insufficiency.  8. GERD.  9. Systolic murmur.  10.Left ventricular hypertrophy.  11.Bilateral knee pain.  12.History of prior abdominal pain for which she was admitted for in      March 2008.   PAST SURGICAL HISTORY:  The patient states that she had a left inguinal  herniorrhaphy, the date is unknown.  The patient also has a history of  hysteroscopy and dilation and curettage in June 2006.  Otherwise, the  patient denies having any other surgeries.   SOCIAL HISTORY:  The patient has been married for 19 years and has one  daughter.  She denies tobacco or alcohol abuse or use.   ALLERGIES:  VICODIN, which she states makes her sick on her stomach but  she also says that makes her eyes swell.   MEDICATIONS:  1. Amlodipine 10 mg daily.  2. Lisinopril 20 mg daily.  3. Prilosec 40 mg daily.  4. Travatan solution taken as directed.  5. Metoprolol 25 mg half a tablet b.i.d. which apparently was      discontinued.  6.  Tylenol Arthritis 650 mg p.o. nightly.  7. She was also told to take hyoscyamine, which she is to dissolve      under her tongue q.4-6 h. p.r.n. for abdominal cramping.   PHYSICAL EXAM:  GENERAL: This is a pleasant, obese 70 year old black  female who is lying in bed and in no acute distress.  VITAL SIGNS: Temperature 98.4, pulse 94, respirations 20, and blood  pressure 168/96.  EYES: Sclerae are noninjected but slightly icteric.  Pupils are equal,  round, and reactive to light.  EARS, NOSE, MOUTH, AND THROAT: Ears and nose with no obvious masses or  lesions.  No rhinorrhea.  Mouth is pink and moist.  However, the patient  does not have any teeth.  Her throat shows no exudate.  NECK: Supple.  Trachea midline.  No thyromegaly.  LUNGS: Clear to auscultation bilaterally with no wheezes, rhonchi, or  rales noted.  Respiratory effort is normal.  HEART: Regular rate and rhythm.  Normal S1, S2, and with a positive  systolic ejection murmur.  However, no gallops or rubs are noted, There  are +2 bilateral  carotid pulses with +1 pedal pulses.  ABDOMEN: Soft, obese, and tender in the right upper quadrant, the right  mid quadrant, and the right lower quadrant.  The patient does not guard  or have any rebounding.  She has active bowel sounds as well.  MUSCULOSKELETAL: All 4 extremities are symmetrical with no cyanosis,  clubbing, or edema noted.  No joint pain.  No joint effusions.  SKIN: Shows no obvious masses, lesions, or rashes.  NEURO: Cranial nerves II through XII appear to be grossly intact.  Deep  tendon reflex exam is deferred at this time.  PSYCHE: The patient is alert and oriented x3 with an appropriate affect.   LABS AND DIAGNOSTICS:  A fecal occult blood is negative.  Last CBC done  on November 02, 2007, shows white blood cell count 4.6, hemoglobin of 11.1,  hematocrit 32.5, platelet is 208,000.  BMET done today showed sodium  141, potassium 3.9, chloride 109, CO2 of 25, glucose 113,  BUN 13, and  creatinine 2.01.  The lactic acid is 1.5.  LFTs were all normal.  Diagnostic CT scan of the abdomen and pelvis revealed a long segment of  small bowel  wall thickening with associated mesenteric edema and  ascites.  At this time, there is no evidence of bowel obstruction or  perforation.   IMPRESSION:  1. Inflammation of the small bowel between the distal jejunum and the      proximal ileum.  Etiology at this time is unknown.  2. Symptomatic cholelithiasis.  3. Hypertension.  4. Renal insufficiency.   PLAN:  At this time, Dr. Georgette Dover has discussed the situation with the  patient, and we will plan for a diagnostic laparoscopy to be done  tomorrow with cholecystectomy and IOC as well.  He plans to take out  this section of small bowel and at that time, send it to pathology to  determine the etiology of the patient's present symptoms.  Also, because  the patient has had symptomatic cholelithiasis potentially for some  time, we will also remove the patient's gallbladder as well.  At this  time, the patient has continued on clear liquids that will be made  n.p.o. after midnight in preparation for surgery tomorrow.      Henreitta Cea, PA      Imogene Burn. Tsuei, M.D.  Electronically Signed    KEO/MEDQ  D:  11/03/2007  T:  11/04/2007  Job:  [id number]   cc:   Burton Apley, M.D.  Tory Emerald Benson Norway, MD

## 2010-11-20 NOTE — Consult Note (Signed)
NAME:  Nancy [name], Nancy [name]               ACCOUNT NO.:  [id number]   MEDICAL RECORD NO.:  [id number]          PATIENT TYPE:  INP   LOCATION:  5125                         FACILITY:  [place]   PHYSICIAN:  Tory Emerald. Benson Norway, MD    DATE OF BIRTH:  08-07-1940   DATE OF CONSULTATION:  08/16/2008  DATE OF DISCHARGE:                                 CONSULTATION   REASON FOR CONSULTATION:  Abnormal CT scan and abdominal pain.   HISTORY OF PRESENT ILLNESS:  This is a 70 year old female with a past  medical history of hypertension, left ventricular hypertrophy,  gastroesophageal reflux disease, renal insufficiency, diverticulosis,  colonic tubular adenoma, status post hysterectomy, and status post  laparoscopic cholecystectomy in April 2009, who was admitted with  complaints of abdominal pain.  The patient states that her abdominal  pain started acutely over the past few days.  It is intense and sharp  and located in the periumbilical region.  Because of the persistence of  the abdominal pain in addition to nausea and vomiting, she was admitted  to hospital for further evaluation and treatment.  A CT scan was  performed and did reveal that she had a persistent inflammation in the  ileum which was noted back in April 2009.  At that time, during her  admission in April, it was felt that she was having issues with her  gallbladder as she also had complained of having some right upper  quadrant pain.  She did undergo a laparoscopic cholecystectomy and  during the surgery, she also underwent an examination of her small bowel  which was grossly normal on the serosal side.  Her gallbladder did  reveal chronic cholecystitis and cholelithiasis.  Immediately post  surgery, the patient denied having any of the admission-type pain and  subsequently, no further evaluation for the abnormalities noted on the  CT scan was performed.  She denied having any of the periumbilical pain  from April 2009 until this  presentation.  Again, the CT scan does reveal  the persistence of this type of finding.   PAST MEDICAL HISTORY AND PAST SURGICAL HISTORY:  As stated above.   FAMILY HISTORY:  Noncontributory.   SOCIAL HISTORY:  Negative for alcohol, tobacco.  She is positive for  cocaine on the urine drug screen.   REVIEW OF SYSTEMS:  As stated above in the history of present illness,  otherwise negative.   PHYSICAL EXAMINATION:  VITAL SIGNS:  Blood pressure is 169/99, heart  rate is 98, respirations 20, temperature is 98.0.  GENERAL:  The patient is in no acute distress, alert and oriented.  HEENT:  Normocephalic and atraumatic.  Extraocular muscles are intact.  NECK:  Supple.  No lymphadenopathy.  LUNGS:  Clear to auscultation bilaterally.  CARDIOVASCULAR:  Regular rate and rhythm.  ABDOMEN:  Obese, soft.  Tender in the periumbilical region.  No rebound  or rigidity.  Positive bowel sounds.  EXTREMITIES:  No clubbing, cyanosis, or edema.   LABORATORY VALUES:  White blood cell count is 4.7, hemoglobin is 11.3,  platelets at 180.  Sodium 133, potassium 3.7, chloride  102, CO2 of 24,  glucose 105, BUN 22, and creatinine is 2.17.  Urine drug screen is  positive for cocaine.   IMPRESSION:  1. Abnormal CT scan revealing thickening of the ileum.  2. Abdominal pain secondary to abnormal CT scan.  3. Multiple other medical problems.   I have reviewed the CT scan with Radiology.  It appears that the lumen  is markedly narrowed, therefore, a capsule endoscopy will not be a  possibility at this time as there is a high likelihood that it will  become stuck in the small bowel.  A better course will be a CT  enterography, hopefully this can help to identify the lumen of her small  bowel.  The other considerations for the etiology can be a  gastrointestinal stromal tumor or possibly a carcinoid.  There is also  the possibility of focal ischemia which can be as a result of her  cocaine use.  If the CT  enterography does reveal that the involved  segment does approach the distal terminal ileum, a colonoscopy with the  terminal ileum intubation can be attempted.  There is the possibility  that she may have Crohn disease.   PLAN AT THIS TIME:  1. To check her blood work for an ESR and CRP.  2. Perform a CT enterography.      Tory Emerald Benson Norway, MD  Electronically Signed     PDH/MEDQ  D:  08/16/2008  T:  08/17/2008  Job:  [id number]

## 2010-11-23 NOTE — Discharge Summary (Signed)
NAME:  Nancy [name], Nancy [name]               ACCOUNT NO.:  [id number]   MEDICAL RECORD NO.:  [id number]          [name] TYPE:  INP   LOCATION:  [id number]                         FACILITY:  [place]   PHYSICIAN:  Alcide Evener, MD  DATE OF BIRTH:  1940-10-18   DATE OF ADMISSION:  08/15/2008  DATE OF DISCHARGE:  08/19/2008                               DISCHARGE SUMMARY   DISCHARGE DIAGNOSES:  1. Abdominal pain, cause unknown.  2. Hypertension, well controlled.  3. Acute and chronic renal insufficiency, acute episode resolved.  4. Proteinuria.  5. Cocaine abuse.   DISCHARGE MEDICATIONS:  1. Depakote 500 mg p.o. daily.  2. Amlodipine 10 mg p.o. daily.  3. Clonidine 0.1 mg p.o. b.i.d.  4. MiraLax 17 g p.o. daily p.r.n. for constipation.  5. Famotidine 40 mg p.o. daily.   DISPOSITION AND FOLLOWUP:  Nancy [name] is discharged home.  She will  follow up with Dr. Darlyne Russian in [facility] on March 2 and she  will follow with Dr. Benson Norway, GI doctor, in about 2-4 weeks and she will  call herself.   FOLLOWUP ISSUES:  1. Persistence of any pain.  2. Hypertension.  We did not continue her metoprolol and lisinopril      while she was in [facility] hospital because she was positive for cocaine      and also she had renal insufficiency.  Her blood pressure was well      controlled with amlodipine and clonidine only.  Depending on her      blood pressure, we may need to restart her lisinopril, but her      kidney function should be closely followed up.  3. Proteinuria.  Please get microalbumin-creatinine ratio from [facility]      clinic.   CONSULTATION:  Dr. Carol Ada with GI.   PROCEDURE PERFORMED:  During this hospitalization include:  1. Chest x-ray done on February 8 is positive for prior      cholecystectomy, otherwise negative for any acute cardiopulmonary      disease.  2. CT scan of abdomen and pelvis without contrast done on February 8      is positive for thickening of Nancy small bowel loops in  Nancy pelvis.      No definite signs of bowel resection identified.  3. Small-bowel follow-through was done on August 17, 2008.  This was      negative for any lesions in small bowel without any obstruction or      stricture.  4. A small-bowel follow-through was done on August 19, 2008.  It was      essentially negative except for some mild erythema in Nancy duodenal      bulb.   BRIEF HISTORY OF PRESENT ILLNESS:  Nancy [name] is a 70 year old lady with  history of GERD, diverticulosis, colonic tubular adenoma, last  fluoroscopy from April 2008 which was described normal prior discharge  summary from June 2009 with history of nonspecific ileitis, first  diagnosed per CAT scan in March 2008, readmitted in April 2009 for  abdominal pain.  A CT scan suggestive of Crohn  disease, had diagnostic  laparoscopy, small bowel normal per intraoperative note, status post  cholecystectomy on April 2009, came with periumbilical abdominal pain  for about 1 week.  It is described as sharp and like having baby,  10/10 when worse, and without any relieving factors, but worse after  food.  Had 2 episode of nonbloody vomitus in about a week.  Bowel  movement is normal.  No urinary symptoms.  No fever or chills, chest  pain, or shortness of breath.  No radiation of Nancy pain.  Nancy [name]  does take some occasional ibuprofen for headache but has not taken any  extra recently.  No other NSAIDs use as well.  Workup from March 2008  also included negative tissue transglutaminase, negative endometrial  antibody, and negative gliadin antibody as well.   PHYSICAL EXAMINATION:  VITAL SIGNS:  Temperature 98.2, pulse 108,  respirations 20, blood pressure 154/87, and oxygen saturation 96 on 2  liters.  GENERAL:  Not in distress.  Oropharynx questionable pallor, but moist.  NECK:  No JVD.  CHEST:  Bilaterally clear to auscultation.  No crackles.  No wheeze.  ABDOMEN:  Bowel sounds normal.  Soft.  Tenderness in Nancy  periumbilical  area, more in right.  No guarding or rigidity.  Pain localized rebound  tenderness.  EXTREMITIES:  No pedal pitting edema.   LABORATORY DATA:  At admission:  WBC 7.4, hemoglobin 13, platelet 219,  and absolute neutrophil count 6.3.  Sodium 143, potassium 4.6, chloride  110, bicarb 27, glucose 116, BUN 26, creatinine 2.31, alkaline  phosphatase 61, total bilirubin 0.6, AST and ALT 16 and 9, total protein  7.3, albumin 3.6, calcium 9.8, and lipase 28.  BNP 44.  UA positive for  her cloudy appearance, small bilirubin, trace ketones, more than 300  protein, 0-2 wbc, had hyaline casts, and wbc casts as well.  Hemoccult  stool was negative.  Venous lactic acid was 1.4.  Magnesium was 2.2.   HOSPITAL COURSE:  1. Abdominal pain.  Nancy [name] was admitted to a tele bed.  Hemoccult      stool was negative.  A CT scan finding was described as per above.      Nancy [name] was kept n.p.o.  Nancy [name] was given some pain      medications and some nausea medications.  A urine drug screen was      done which was positive for cocaine raising possibility of cocaine      induced vasospasm and consequent ischemia to gut as a possible      cause. However we were also concerned about other possible causes      including Inflammatory Bowel Disease. Dr. Benson Norway from GI kindly came      by and saw Nancy [name].  There was some doubt for possible terminal      ileum involvement and possibly obstruction.  So, just to make sure      she can pass a capsule for capsule endoscopy, a small-bowel follow-      through was done which was negative for any obstruction or      stricture.  This was followed by capsule endoscopy which was      essentially negative.  Nancy [name]'s pain got better.  ESR was      checked which was normal at 18.  Nancy CRP was mildly elevated at 1.      Dr. Benson Norway felt that Nancy [name] can be discharged.  Nancy [name] is  discharged on February 12.  Her pain is now minimal.  She is       advised not to abuse cocaine.  She will be followed by Dr. Benson Norway in      his office and by Dr. Cletus Gash in [facility].  2. Acute and chronic renal failure.  Nancy [name] came on with      creatinine of 2.31.  According to Nancy discharge summary from June      2009, her baseline creatinine seems to be 1.6-1.8, but her      creatinine has been high at this admission which persisted between      2.07-2.3.  This may be her new baseline, but we did check random      urine sodium and creatinine, random sodium was 54 and creatinine      was 282.  We also hydrated her and asked her to avoid any NSAIDS in      Nancy future which will also help with her stomach.  3. Proteinuria.  She should get microalbumin-creatinine ratio from Nancy      clinic.  4. Hypertension.  We discontinued Nancy Lopressor which was on her      medication list prior to admission.  This is because her UDS was      positive for cocaine.  Her blood pressure was pretty well      controlled with Norvasc and some clonidine.  Her lisinopril was      also not continued in Nancy hospital.  5. Substance abuse with cocaine.  She is advised not to do cocaine.      Please note that she had a history of cerebral hemorrhage.  It is      likely that her cocaine may attribute to it as well as her renal      failure as well as abdominal pain.   VITALS ON Nancy DAY OF DISCHARGE:  Temperature 98.2, pulse 81,  respirations 18, blood pressure 149/84, and oxygen saturation 98 on room  air.   Sodium 139, potassium 3.7, chloride 105, bicarb 26, glucose 107, BUN 12,  creatinine 2.06, and calcium 8.8.  WBC 3.6, hemoglobin 10.8, and  platelet count 170.      Flo Shanks, MD  Electronically Signed      Alcide Evener, MD  Electronically Signed    YP/MEDQ  D:  08/20/2008  T:  08/20/2008  Job:  [id number]   cc:   Darlyne Russian, MD  Tory Emerald. Benson Norway, MD

## 2010-11-23 NOTE — Consult Note (Signed)
NAME:  Nancy [name], Nancy [name]               ACCOUNT NO.:  [id number]   MEDICAL RECORD NO.:  [id number]          PATIENT TYPE:  INP   LOCATION:  3728                         FACILITY:  [facility]   PHYSICIAN:  Marcello Moores A. Cornett, M.D.DATE OF BIRTH:  1940-08-26   DATE OF CONSULTATION:  DATE OF DISCHARGE:                                 CONSULTATION   PHYSICIAN REQUESTED CONSULTATION:  Dr. Jeanell Sparrow   REASON FOR CONSULTATION:  Abdominal pain.   HISTORY OF PRESENT ILLNESS:  The patient is a 70 year old female with a  one-day history of left upper quadrant pain.  It started last night, it  was gradual and it has increased.  On a scale of 10 it is probably 5 to  6/10.  There is very little associated nausea and vomiting.  The pain is  constant and crampy in nature in her left lower quadrant with minimal  radiation to the right lower quadrant.  Denies any fever or chills.  There is no history of other gastrointestinal problem.  I was asked to  see the patient at the request of Dr. Jeanell Sparrow for this.   PAST MEDICAL HISTORY:  1. Hypertension.  2. Gastroesophageal reflux disease.  3. Obesity.   PAST SURGICAL HISTORY:  Hysterectomy.   ALLERGIES:  No known drug allergies.   MEDICATIONS:  Include hydrochlorothiazide, lisinopril, Norvasc, Pepcid.   FAMILY HISTORY:  Noncontributory.   SOCIAL HISTORY:  No tobacco or alcohol use noted.  She is married.   REVIEW OF SYSTEMS:  The 15-point review of systems is otherwise negative  except for that stated above.   PHYSICAL EXAMINATION:  VITALS:  Temperature 97, heart rate 85, blood  pressure 116/57.  GENERAL APPEARANCE:  Pleasant female in no apparent distress.  HEENT:  Extraocular movements are intact with no evidence of scleral  icterus.  NECK:  Trachea midline, no mass.  CHEST:  Clear to auscultation, chest wall motion normal.  ABDOMEN:  Obese, but soft, mild left lower quadrant tenderness to  palpation.  No mass.  No hernia.  No peritonitis.  CARDIOVASCULAR:   Regular rate and rhythm without rub, murmur or gallop.  EXTREMITIES:  Muscle tone is normal, range of motion is normal, minimal  lower extremity edema noted.  NEUROLOGICAL EXAMINATION:  Motor and sensory function are grossly  intact.  Cranial nerves II-XII are intact.  Glasgow coma scale is 15.   Abdominal CT scan shows some mild small-bowel thickening in the left  lower quadrant.  She does have diverticula.  No abscess.  There is some  stranding and inflammation in the left lower quadrant.  There is minimal  fluid.  No evidence of free air.   Sodium 139, potassium 3.5, chloride 102, CO2 24, BUN 31, creatinine 1.8,  glucose 125, white count of 8000 with left shift, hemoglobin 13,  platelet count is 213,000, albumin 3.1.   IMPRESSION:  Left lower quadrant inflammatory process.  This is unlikely  related to ischemia.  It is more likely related to gastroenteritis  versus diverticulitis.  Recommend IV fluids and IV antibiotics and a  medical consultation.  She has no  acute surgical issues at this point in  time.  We will follow her though if she is in the hospital.      Marcello Moores A. Cornett, M.D.  Electronically Signed     TAC/MEDQ  D:  09/08/2006  T:  09/08/2006  Job:  [id number]   cc:   Dr. Jeanell Sparrow

## 2010-11-23 NOTE — Op Note (Signed)
NAME:  Grunder, [place]               ACCOUNT NO.:  [id number]   MEDICAL RECORD NO.:  [id number]          PATIENT TYPE:  AMB   LOCATION:  [place]                           FACILITY:  [facility]   PHYSICIAN:  Charles A. Jodi Mourning, M.D.DATE OF BIRTH:  06-10-1941   DATE OF PROCEDURE:  12/29/2004  DATE OF DISCHARGE:                                 OPERATIVE REPORT   PREOPERATIVE DIAGNOSES:  1.  Postmenopausal bleeding.  2.  Uterine fibroids.   POSTOPERATIVE DIAGNOSES:  1.  Postmenopausal bleeding.  2.  Uterine fibroids.   PROCEDURE:  Hysteroscopy and dilatation and curettage.   SURGEON:  Charles A. Jodi Mourning, M.D.   ANESTHESIA:  MAC with paracervical block   COMPLICATIONS:  None.   ESTIMATED BLOOD LOSS:  Minimal.   SPECIMEN:  Endometrial curettings.   FINDINGS:  Atrophic endometrium.   OPERATION:  The patient was brought to operating room after satisfactory IV  sedation, the legs were brought up in stirrups, and the vagina was prepped  and draped in the usual sterile fashion.  The urinary bladder was emptied of  approximately 100 mL clear urine.  Bimanual examination revealed the uterus  to be midposition, enlarged mildly, approximately 10-12 weeks'.  A sterile  speculum was inserted in the vaginal vault and the cervix was isolated.  The  anterior lip of the cervix was grasped with a single-tooth tenaculum.  The  cervix was then dilated to a #21 Pratt dilator.  The hysteroscope was then  introduced into the uterine cavity and a hysteroscopic survey was done.  The  mucosa appeared atrophic but had a moderate amount of fragments of  endometrium with no clearly-identified submucosal fibroids or polyps.  The  hysteroscope was then removed and curettage was performed with a small  serrated curette, and the endometrial curettings were submitted to pathology  for evaluation.  There was no active bleeding at the conclusion of the  procedure.  All instruments were retired.  The patient tolerated the  procedure well, was transported to the recovery room in satisfactory  condition.       CAH/MEDQ  D:  12/28/2004  T:  12/29/2004  Job:  [id number]

## 2010-11-23 NOTE — H&P (Signed)
NAME:  Nancy [name], Nancy [name]               ACCOUNT NO.:  [id number]   MEDICAL RECORD NO.:  [id number]          PATIENT TYPE:  AMB   LOCATION:  [place]                           FACILITY:  [facility]   PHYSICIAN:  Agnes Lawrence, M.D.DATE OF BIRTH:  17-Oct-1940   DATE OF ADMISSION:  DATE OF DISCHARGE:                                HISTORY & PHYSICAL   CHIEF COMPLAINT:  The patient is a 70 year old with abnormal vaginal  bleeding who presents for a D&C hysteroscopy.   HISTORY OF PRESENT ILLNESS:  The patient describes the onset of self limited  vaginal bleeding approximately one month ago. The bleeding lasted for one  week. She denies any concomitant symptoms. She is not presently taking any  hormone replacement therapy. A recent ultrasound demonstrated multiple  myomas. The endometrial stripe was 7.4 mm. She had a similar episode of  bleeding approximately five years ago and the workup was performed at that  point.   ALLERGIES:  No known drug allergies.   PAST MEDICAL HISTORY:  Hypertension.   PAST SURGICAL HISTORY:  Left inguinal herniorrhaphy.   SOCIAL HISTORY:  She is married, living with her spouse.   OCCUPATION:  Unemployed, disabled.   TOBACCO:  Has no significant smoking history.   ALCOHOL:  Does not give any significant history of alcohol usage.   ILLICIT DRUG USE:  Denies illicit drug use.   FAMILY HISTORY:  Mother with hypertension, heart disease. Siblings with  hypertension, no cancers.   PAST GYN HISTORY:  Menopausal since age 82. Last Pap smear performed on June  6. She denies any previous gynecological surgery.   PAST OB HISTORY:  She has one living child.   REVIEW OF SYMPTOMS:  Not pertinent to the history of present illness.   PHYSICAL EXAMINATION:  VITAL SIGNS:  Temperature 97.8, pulse 73, blood  pressure 193/91. This was repeated and was 160/90. Weight 202 pounds.  CONSTITUTIONAL:  African-American female appears stated age, over weight  body habitus.  LUNGS:   Clear to auscultation bilaterally.  HEART:  Regular rate and rhythm.  ABDOMEN:  Soft, nontender without masses.  FEMALE EXAM:  External female genitalia normal EGBUS. On speculum exam of  the vagina, there is menstrual blood present. The cervix is clear, firm and  closed, no visible lesions. The uterus is nontender, and of normal size,  shape and consistency, position and mobility are normal. Adnexa, no masses,  organomegaly or local guarding. Rectovaginal exam, single external  hemorrhoid.   ASSESSMENT:  Postmenopausal bleeding with a thickened endometrial stripe and  small myomas.   PLAN:  The planned procedure is a D&C hysteroscopy. Discussed risks,  benefits, and alternatives of surgery at length.       LAJ/MEDQ  D:  12/27/2004  T:  12/27/2004  Job:  [id number]

## 2010-11-23 NOTE — Discharge Summary (Signed)
NAME:  Nancy [name], Nancy [name]               ACCOUNT NO.:  [id number]   MEDICAL RECORD NO.:  [id number]          PATIENT TYPE:  INP   LOCATION:  [id number]                         FACILITY:  [facility]   PHYSICIAN:  Alison Murray, M.D.  DATE OF BIRTH:  1940-12-24   DATE OF ADMISSION:  09/08/2006  DATE OF DISCHARGE:  09/10/2006                               DISCHARGE SUMMARY   ATTENDING PHYSICIAN:  Dr. Lars Mage M.D.   DISCHARGE DIAGNOSES:  1. Colitis, uncomplicated.  2. Acute-on-chronic renal failure.  3. Systolic ejection murmur.  4. Normocytic anemia.  5. Gastroesophageal reflux disease.  6. Obesity.  7. Uterine fibroids.  8. History of postmenopausal bleeding with hysteroscopy and dilatation      and curettage.   DISCHARGE MEDICATIONS:  1. Norvasc 10 mg p.o. daily.  2. Metoprolol 25 mg daily.  3. Hyoscyamine (Levsin) 0.125 mg q.4-6h. p.r.n. for abdominal      cramping.  4. Famotidine 40 mg p.o. daily for heartburn.  5. The patient was instructed to discontinue the use of lisinopril and      hydrochlorothiazide until otherwise directed by her primary care      physician.   DISPOSITION AND FOLLOWUP:  Nancy [name] was discharged home in stable  condition with resolution of her presenting symptoms.  She has an  appointment with Dr. Burton Apley in the outpatient clinic.  The  clinic will call her with the appointment day and time.  At the time of  her outpatient clinic appointment the results of the anti-endomysial  antibody will need to be verified.  A transcribed report of the 2-D  echocardiogram is not available at the time of this dictation.   BRIEF ADMITTING HISTORY AND PHYSICAL:  Nancy [name] is a 70 year old  African-American woman with a past medical history significant for  hypertension and renal insufficiency who presented to the emergency  department complaining of abdominal cramping 1 day prior to admission.  She describes the pain as 9/10.  It woke her from sleep.  It is  mostly  lower abdominal pain and diffuse.  She had one episode of nausea but no  vomiting.  She has had a decreased appetite for a few days prior to  admission.  She denies any fever or chills.  She denies any diarrhea or  diaphoresis.  No history of food exposure out of the ordinary.  She is  post menopausal.  She had no melena, no hematochezia.  She did report a  visit to Dr. Collene Mares last week for an outpatient colonoscopy procedure.  Dr. Collene Mares did not continue with the colonoscopy secondary to her rising  creatinine level and auscultation of a heart murmur during preprocedure  examination.  The patient was admitted to the hospital for evaluation of  her new-onset nausea, vomiting and abdominal pain.   ADMISSION LABORATORIES:  Sodium 139, potassium 3.5, chloride 102, bicarb  24, BUN 31, creatinine 1.89, glucose 125.  CBC:  Wbc's 8.2, hemoglobin  13.1, platelets 213, ANC 7.2, MCV 84.  Anion gap 13, protein 7.5,  albumin 3.7, calcium 9.7, lipase 36.  UA showed protein  of 100, rare  bacteria, some hyaline casts, 0-2 rbc's, 0-2 wbc's.   VITAL SIGNS ON ADMISSION:  Temperature 98.2, blood pressure 128/61,  pulse 96, respiratory rate 16, O2 saturation 99% on room air.   PROCEDURES PERFORMED:  1. Renal ultrasound:  The kidneys were normal in size.  There was no      hydronephrosis or masses seen.  Echogenicity was normal and the      bladder was within normal limits.  2. CT of the abdomen and pelvis:  Small gallstones were seen in the      gallbladder.  They were nonobstructing.  There was no common bile      duct dilation seen.  A small pericardial effusion was noted.  There      were no renal calculi seen, no hydronephrosis.  Bibasilar      atelectasis was seen with some small lung nodules.  There is a      small abnormal segment of bowel in the anterior left pelvis with      some fluid surrounding it.  The bowel loop appears to be edematous      but is not obstructing.  The terminal ileum  appeared normal in      caliber.   HOSPITAL COURSE BY PROBLEM:  #1 - ABDOMINAL PAIN, NAUSEA AND VOMITING.  Nancy [name] was admitted for observation, evaluation and management of her  abdominal pain, nausea and vomiting.  She was started with IV fluid  hydration since she was mildly volume depleted.  Findings on CT scan of  her bowel were nonspecific, showing bowel thickening with some  intraperitoneal fluid.  This is mostly consistent with inflammatory  bowel disease, but some mild diverticulitis cannot be entirely ruled out  as contributing to her admission complaint.  Her LFTs were not  consistent with a hepatobiliary process or gallbladder disease.  However, there are stones seen in the gallbladder.  We checked her for  anti-endomysial antibodies and those lab results are pending at the time  of discharge.  She did tolerate an advancement of her diet.  She had no  nausea, vomiting or severe abdominal pain while being followed in the  hospital.   #2 - ACUTE RENAL FAILURE.  On admission Nancy [name] had an elevated  creatinine greater than her baseline level, which is typically around  1.5; at admission she was 2.1.  This was thought to be secondary to  volume depletion from her abdominal complaints and decreased p.o.  intake.  This finding was not explained by hydration status and her  creatinine rose while here in the hospital.  We held her  hydrochlorothiazide and lisinopril as potentially contributing to this  increased serum creatinine.  At the time of her discharge, her  creatinine showed a downward trend and it was felt that she was stable  for discharge.  Urine studies were negative for an infectious process.  There were also negative for significant proteinuria and there were no  casts seen on urine sediment studies.   #3 - NEW FINDING OF SYSTOLIC EJECTION MURMUR.  A 2-D echocardiogram was  obtained and showed no significant outflow abnormalities.  Official reading for the  echocardiogram is not available at the time of this  dictation.  The echocardiogram will be read and will need to be followed  in the outpatient setting for completion.   DISCHARGE LABORATORY DATA:  Temperature 98.7, blood pressure 151/93,  heart rate 86, respiratory rate 16, O2 saturations 96% on  room air.  Wbc's 4.0, hemoglobin 10.7, hematocrit 31, platelets 205.  Ferritin 104.  Urine culture negative.  Sodium 141, potassium 3.8, chloride 112, bicarb  26, BUN 19, creatinine 1.74, glucose 94.      Acquanetta Chain, D.O.  Electronically Signed      Alison Murray, M.D.  Electronically Signed    ELG/MEDQ  D:  09/23/2006  T:  09/24/2006  Job:  [id number]   cc:   Burton Apley, M.D.

## 2010-12-04 ENCOUNTER — Other Ambulatory Visit: Payer: Self-pay | Admitting: *Deleted

## 2010-12-04 MED ORDER — AMLODIPINE BESYLATE 10 MG PO TABS: 10.0000 mg | ORAL_TABLET | Freq: Every day | ORAL | Status: DC

## 2010-12-04 MED ORDER — CLONIDINE HCL 0.1 MG PO TABS: 0.1000 mg | ORAL_TABLET | Freq: Two times a day (BID) | ORAL | Status: DC

## 2010-12-10 ENCOUNTER — Encounter: Payer: Self-pay | Admitting: Internal Medicine

## 2010-12-17 ENCOUNTER — Encounter: Payer: Self-pay | Admitting: Internal Medicine

## 2011-01-23 ENCOUNTER — Other Ambulatory Visit (HOSPITAL_COMMUNITY): Payer: Self-pay | Admitting: Family Medicine

## 2011-01-23 DIAGNOSIS — Z1231 Encounter for screening mammogram for malignant neoplasm of breast: Secondary | ICD-10-CM

## 2011-02-25 ENCOUNTER — Ambulatory Visit (HOSPITAL_COMMUNITY): Payer: Self-pay

## 2011-02-27 ENCOUNTER — Ambulatory Visit (HOSPITAL_COMMUNITY)
Admission: RE | Admit: 2011-02-27 | Discharge: 2011-02-27 | Disposition: A | Discharge status: Home / Self Care | Payer: PRIVATE HEALTH INSURANCE | Source: Ambulatory Visit | Attending: Family Medicine | Admitting: Family Medicine

## 2011-02-27 DIAGNOSIS — Z1231 Encounter for screening mammogram for malignant neoplasm of breast: Secondary | ICD-10-CM

## 2011-02-28 ENCOUNTER — Other Ambulatory Visit: Payer: Self-pay | Admitting: Family Medicine

## 2011-02-28 DIAGNOSIS — R928 Other abnormal and inconclusive findings on diagnostic imaging of breast: Secondary | ICD-10-CM

## 2011-03-06 ENCOUNTER — Other Ambulatory Visit: Payer: Medicare Other

## 2011-03-08 ENCOUNTER — Ambulatory Visit
Admission: RE | Admit: 2011-03-08 | Discharge: 2011-03-08 | Disposition: A | Discharge status: Home / Self Care | Payer: PRIVATE HEALTH INSURANCE | Source: Ambulatory Visit | Attending: Family Medicine | Admitting: Family Medicine

## 2011-03-08 DIAGNOSIS — R928 Other abnormal and inconclusive findings on diagnostic imaging of breast: Secondary | ICD-10-CM

## 2011-04-02 LAB — COMPREHENSIVE METABOLIC PANEL
ALT: 11
Alkaline Phosphatase: 49
CO2: 25
Chloride: 102
Glucose, Bld: 123 — ABNORMAL HIGH
Potassium: 4
Sodium: 136
Total Bilirubin: 0.7
Total Protein: 6.9

## 2011-04-02 LAB — BASIC METABOLIC PANEL
BUN: 10
BUN: 13
CO2: 26
Calcium: 9
Calcium: 9
Chloride: 110
GFR calc Af Amer: 32 — ABNORMAL LOW
GFR calc non Af Amer: 25 — ABNORMAL LOW
GFR calc non Af Amer: 27 — ABNORMAL LOW
Glucose, Bld: 108 — ABNORMAL HIGH
Glucose, Bld: 113 — ABNORMAL HIGH
Glucose, Bld: 118 — ABNORMAL HIGH
Potassium: 3.5
Potassium: 3.6
Potassium: 3.9
Sodium: 140
Sodium: 141

## 2011-04-02 LAB — DIFFERENTIAL
Basophils Absolute: 0
Basophils Relative: 0
Eosinophils Relative: 0
Lymphocytes Relative: 9 — ABNORMAL LOW
Lymphs Abs: 0.7
Monocytes Relative: 2 — ABNORMAL LOW
Neutro Abs: 7.4

## 2011-04-02 LAB — CREATININE, URINE, RANDOM: Creatinine, Urine: 38.1

## 2011-04-02 LAB — URINALYSIS, ROUTINE W REFLEX MICROSCOPIC
Bilirubin Urine: NEGATIVE
Ketones, ur: NEGATIVE
Nitrite: NEGATIVE
Protein, ur: 100 — AB
pH: 5

## 2011-04-02 LAB — IRON AND TIBC
Saturation Ratios: 24
UIBC: 176

## 2011-04-02 LAB — LIPASE, BLOOD: Lipase: 24

## 2011-04-02 LAB — AMYLASE: Amylase: 46

## 2011-04-02 LAB — SODIUM, URINE, RANDOM: Sodium, Ur: 48

## 2011-04-02 LAB — FOLATE: Folate: 6.7

## 2011-04-02 LAB — CBC
HCT: 32.5 — ABNORMAL LOW
HCT: 37.1
Hemoglobin: 11.1 — ABNORMAL LOW
Hemoglobin: 12.4
MCHC: 34.2
Platelets: 215
RBC: 3.81 — ABNORMAL LOW
RBC: 4.3
RDW: 13.9
RDW: 14.1
WBC: 6.3
WBC: 8.3

## 2011-04-02 LAB — 5 HIAA, QUANTITATIVE, URINE, 24 HOUR
5-HIAA, 24 Hr Urine: 1.6 mg/24 h (ref <=6.0)
Volume, Urine-5HIAA: 2700 mL/24 h

## 2011-04-02 LAB — CREATININE, URINE, 24 HOUR
Creatinine, 24H Ur: 921
Urine Total Volume-UCRE24: 2700

## 2011-04-02 LAB — PHOSPHORUS: Phosphorus: 3.7

## 2011-04-02 LAB — VITAMIN B12: Vitamin B-12: 364 (ref 211–911)

## 2011-04-02 LAB — LIPID PANEL: Cholesterol: 157

## 2011-04-02 LAB — APTT: aPTT: 26

## 2011-04-02 LAB — RETICULOCYTES: Retic Count, Absolute: 71.1

## 2011-04-02 LAB — OCCULT BLOOD X 1 CARD TO LAB, STOOL: Fecal Occult Bld: NEGATIVE

## 2011-04-02 LAB — PROTIME-INR
INR: 1.1
Prothrombin Time: 14

## 2011-04-02 LAB — FERRITIN: Ferritin: 91 (ref 10–291)

## 2011-04-04 LAB — COMPREHENSIVE METABOLIC PANEL
ALT: 12
AST: 19
AST: 32
Albumin: 3.4 — ABNORMAL LOW
Alkaline Phosphatase: 41
BUN: 19
BUN: 25 — ABNORMAL HIGH
CO2: 26
CO2: 27
Calcium: 9.3
Chloride: 106
Chloride: 105
Creatinine, Ser: 2.01 — ABNORMAL HIGH
Creatinine, Ser: 1.49 — ABNORMAL HIGH
Creatinine, Ser: 1.68 — ABNORMAL HIGH
GFR calc Af Amer: 30 — ABNORMAL LOW
GFR calc Af Amer: 37 — ABNORMAL LOW
GFR calc non Af Amer: 35 — ABNORMAL LOW
GFR calc non Af Amer: 31 — ABNORMAL LOW
Glucose, Bld: 109 — ABNORMAL HIGH
Potassium: 4.4
Sodium: 138
Total Bilirubin: 0.6
Total Bilirubin: 1.2
Total Protein: 6.8

## 2011-04-04 LAB — CBC
HCT: 35.9 — ABNORMAL LOW
HCT: 37.5
HCT: 37.9
HCT: 33.6 — ABNORMAL LOW
HCT: 40.1
Hemoglobin: 11.9 — ABNORMAL LOW
Hemoglobin: 13.2
Hemoglobin: 11.5 — ABNORMAL LOW
MCHC: 33.3
MCHC: 34.7
MCV: 86.5
MCV: 86.3
MCV: 88
Platelets: 182
Platelets: 196
Platelets: 198
Platelets: 198
RBC: 4.15
RBC: 4.4
RBC: 3.85 — ABNORMAL LOW
RBC: 3.88
RDW: 13.4
RDW: 14.2
RDW: 14.5
WBC: 5.4
WBC: 4.6
WBC: 6.4

## 2011-04-04 LAB — SEDIMENTATION RATE: Sed Rate: 17

## 2011-04-04 LAB — DIFFERENTIAL
Basophils Absolute: 0
Basophils Absolute: 0
Basophils Relative: 0
Eosinophils Absolute: 0
Eosinophils Absolute: 0.1
Eosinophils Absolute: 0.1
Eosinophils Relative: 0
Eosinophils Relative: 2
Lymphocytes Relative: 18
Lymphs Abs: 0.9
Monocytes Absolute: 0.1
Monocytes Relative: 1 — ABNORMAL LOW
Neutrophils Relative %: 79 — ABNORMAL HIGH
Neutrophils Relative %: 70

## 2011-04-04 LAB — URINALYSIS, ROUTINE W REFLEX MICROSCOPIC
Bilirubin Urine: NEGATIVE
Glucose, UA: NEGATIVE
Glucose, UA: NEGATIVE
Hgb urine dipstick: NEGATIVE
Hgb urine dipstick: NEGATIVE
Ketones, ur: NEGATIVE
Ketones, ur: NEGATIVE
Leukocytes, UA: NEGATIVE
Nitrite: NEGATIVE
Specific Gravity, Urine: 1.021
Specific Gravity, Urine: 1.022
Urobilinogen, UA: 1
Urobilinogen, UA: 1
pH: 6.5
pH: 5.5

## 2011-04-04 LAB — POCT I-STAT, CHEM 8
BUN: 22
BUN: 19
Calcium, Ion: 1.11 — ABNORMAL LOW
Chloride: 106
Creatinine, Ser: 2.1 — ABNORMAL HIGH
Creatinine, Ser: 1.8 — ABNORMAL HIGH
Glucose, Bld: 100 — ABNORMAL HIGH
Glucose, Bld: 102 — ABNORMAL HIGH
HCT: 39
Hemoglobin: 13.3
Potassium: 3.2 — ABNORMAL LOW
Potassium: 3.9
Sodium: 141
Sodium: 137
TCO2: 26

## 2011-04-04 LAB — BASIC METABOLIC PANEL
BUN: 20
BUN: 19
BUN: 20
BUN: 22
CO2: 26
CO2: 31
Calcium: 8.9
Calcium: 9.3
Calcium: 9.5
Chloride: 106
Chloride: 100
Creatinine, Ser: 1.94 — ABNORMAL HIGH
Creatinine, Ser: 1.76 — ABNORMAL HIGH
Creatinine, Ser: 2.02 — ABNORMAL HIGH
GFR calc Af Amer: 30 — ABNORMAL LOW
GFR calc Af Amer: 44 — ABNORMAL LOW
GFR calc non Af Amer: 29 — ABNORMAL LOW
GFR calc non Af Amer: 37 — ABNORMAL LOW
Glucose, Bld: 110 — ABNORMAL HIGH
Glucose, Bld: 125 — ABNORMAL HIGH
Glucose, Bld: 89
Potassium: 3.9
Potassium: 4.2
Sodium: 139
Sodium: 138

## 2011-04-04 LAB — TSH: TSH: 3.093 (ref 0.350–4.500)

## 2011-04-04 LAB — URINE MICROSCOPIC-ADD ON

## 2011-04-04 LAB — PROTIME-INR
Prothrombin Time: 13.1
Prothrombin Time: 12.8

## 2011-04-04 LAB — CARDIAC PANEL(CRET KIN+CKTOT+MB+TROPI): Troponin I: 0.01

## 2011-04-04 LAB — OCCULT BLOOD X 1 CARD TO LAB, STOOL: Fecal Occult Bld: POSITIVE

## 2011-04-04 LAB — APTT: aPTT: 20 — ABNORMAL LOW

## 2011-04-09 ENCOUNTER — Other Ambulatory Visit: Payer: Self-pay | Admitting: *Deleted

## 2011-04-09 MED ORDER — FAMOTIDINE 40 MG PO TABS: 40.0000 mg | ORAL_TABLET | Freq: Every day | ORAL | Status: DC

## 2011-07-05 ENCOUNTER — Encounter: Payer: PRIVATE HEALTH INSURANCE | Admitting: Internal Medicine

## 2011-07-12 ENCOUNTER — Encounter: Payer: PRIVATE HEALTH INSURANCE | Admitting: Internal Medicine

## 2011-09-03 ENCOUNTER — Other Ambulatory Visit: Payer: Self-pay | Admitting: *Deleted

## 2011-09-03 NOTE — Telephone Encounter (Signed)
Pt has an appt 09/17/11 w/Dr Jerelene Redden.

## 2011-09-04 MED ORDER — AMLODIPINE BESYLATE 10 MG PO TABS: 10.0000 mg | ORAL_TABLET | Freq: Every day | ORAL | Status: DC

## 2011-09-04 MED ORDER — CLONIDINE HCL 0.1 MG PO TABS: 0.1000 mg | ORAL_TABLET | Freq: Two times a day (BID) | ORAL | Status: DC

## 2011-09-17 ENCOUNTER — Encounter: Payer: Self-pay | Admitting: Internal Medicine

## 2011-09-17 ENCOUNTER — Ambulatory Visit (INDEPENDENT_AMBULATORY_CARE_PROVIDER_SITE_OTHER): Payer: PRIVATE HEALTH INSURANCE | Admitting: Internal Medicine

## 2011-09-17 DIAGNOSIS — IMO0002 Reserved for concepts with insufficient information to code with codable children: Secondary | ICD-10-CM

## 2011-09-17 DIAGNOSIS — M1710 Unilateral primary osteoarthritis, unspecified knee: Secondary | ICD-10-CM

## 2011-09-17 DIAGNOSIS — I10 Essential (primary) hypertension: Secondary | ICD-10-CM

## 2011-09-17 DIAGNOSIS — R911 Solitary pulmonary nodule: Secondary | ICD-10-CM

## 2011-09-17 DIAGNOSIS — A499 Bacterial infection, unspecified: Secondary | ICD-10-CM

## 2011-09-17 DIAGNOSIS — B9689 Other specified bacterial agents as the cause of diseases classified elsewhere: Secondary | ICD-10-CM

## 2011-09-17 DIAGNOSIS — J984 Other disorders of lung: Secondary | ICD-10-CM

## 2011-09-17 DIAGNOSIS — J329 Chronic sinusitis, unspecified: Secondary | ICD-10-CM

## 2011-09-17 DIAGNOSIS — N259 Disorder resulting from impaired renal tubular function, unspecified: Secondary | ICD-10-CM

## 2011-09-17 DIAGNOSIS — Z23 Encounter for immunization: Secondary | ICD-10-CM

## 2011-09-17 DIAGNOSIS — N289 Disorder of kidney and ureter, unspecified: Secondary | ICD-10-CM

## 2011-09-17 DIAGNOSIS — Z0000 Encounter for general adult medical examination without abnormal findings: Secondary | ICD-10-CM | POA: Insufficient documentation

## 2011-09-17 HISTORY — DX: Other specified bacterial agents as the cause of diseases classified elsewhere: B96.89

## 2011-09-17 HISTORY — DX: Chronic sinusitis, unspecified: J32.9

## 2011-09-17 MED ORDER — DOXYCYCLINE HYCLATE 100 MG PO TABS: 100.0000 mg | ORAL_TABLET | Freq: Two times a day (BID) | ORAL | Status: AC

## 2011-09-17 NOTE — Progress Notes (Signed)
Subjective:     Patient ID: Nancy [name], female   DOB: Oct 08, 1940, 71 y.o.   MRN: [id number]  HPI  Pt reports 2 weeks of cold symptoms.  She reports headache and increased cough.  She described her cough as productive of yellow phlegm; denies hemoptysis.  She has noticed increased wheezing.  Denies fevers and chills.  Admits to sore throat when her sx began but that this is now resolved.  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  She admits to increased sinus congestion but no increased drainage.  She has never smoked cigarettes.    Review of Systems Constitutional: Negative for fever, chills, diaphoresis, activity change, appetite change, fatigue and unexpected weight change.  HENT: Negative for hearing loss, congestion and neck stiffness.   Eyes: Negative for photophobia, pain and visual disturbance.  Respiratory: Negative for cough, chest tightness, shortness of breath and wheezing.   Cardiovascular: Negative for chest pain and palpitations.  Gastrointestinal: Negative for abdominal pain, blood in stool and anal bleeding.  Genitourinary: Negative for dysuria, hematuria and difficulty urinating.  Musculoskeletal: Negative for joint swelling.  Neurological: Negative for dizziness, syncope, speech difficulty, weakness, numbness and headaches.      Objective:   Physical Exam GEN: No apparent distress.  Alert and oriented x 3.  Pleasant, conversant, and cooperative to exam. HEENT: head is autraumatic and normocephalic.  Neck is supple without palpable masses or lymphadenopathy. Bilateral cataracts noted.  EOMI.  PERRLA.  Sclerae anicteric.  Conjunctivae without pallor or injection. Mucous membranes are moist.  Oropharynx is without erythema, exudates, or other abnormal lesions.  Patient is edentulous  RESP:  Lungs are clear to ascultation bilaterally with good air movement.  No wheezes, ronchi, or rubs. CARDIOVASCULAR: regular rate, normal rhythm.  Clear S1, S2, no murmurs,  gallops, or rubs. ABDOMEN: soft, non-tender, non-distended.  Bowels sounds present in all quadrants and normoactive.  No palpable masses. EXT: warm and dry. No clubbing or cyanosis.  Trace edema in bilateral lower extremities. SKIN: warm and dry with normal turgor.  No rashes or abnormal lesions observed.      Assessment:

## 2011-09-17 NOTE — Assessment & Plan Note (Signed)
Patient has experienced 14 days of sinus headache, sinus congestion, and cough. She is afebrile and hemodynamically stable. We'll treat for acute bacterial sinusitis with a 7 day course of doxycycline. She is advised to return to the clinic or go to the emergency room if she develops any fevers, chills, nausea, vomiting, worsening headache difficulty breathing, or other concerning complaint.

## 2011-09-17 NOTE — Assessment & Plan Note (Signed)
Patient has multiple calcified pulmonary nodules first discovered in 2004. 3 repeat CT scans have been obtained; a most recent was obtained in 12/13/2005 that revealed stability and all nodules.

## 2011-09-17 NOTE — Assessment & Plan Note (Signed)
Will check metabolic panel today.

## 2011-09-17 NOTE — Patient Instructions (Addendum)
Schedule a follow up appointments with Dr. Jerelene Redden in May. I will call you if any of your blood work is not normal. If you have any questions or concerns call the clinic at [phone]. Doxycycline is an antibiotic to treat your sinus infection. Use as directed. Try not to miss any doses. Be sure to take the entire seven-day course of treatment. Your prescription was sent in to your pharmacy. Use over the counter Tylenol as needed for your knee pain.  Tylenol is safe for your kidneys.

## 2011-09-17 NOTE — Assessment & Plan Note (Signed)
Patient is currently not using anything for relief of pain. Advised her to use over-the-counter Tylenol as instructed for management of her pain. We'll followup on this with her at her next office visit

## 2011-09-17 NOTE — Assessment & Plan Note (Signed)
Blood pressure elevated slightly above goal today but within acceptable limits. Patient is tolerating her current regimen well. Will check a conference of metabolic panel today as well as lipid profile. Will consider addition of a third medication once the results of her renal function return.

## 2011-09-17 NOTE — Assessment & Plan Note (Signed)
Will give T.dap, Pneumovax, annual influenza vaccinations today.  Patient is currently up-to-date on mammogram screening.  We'll followup with her regarding colonoscopy at her next office visit.

## 2011-09-18 LAB — LIPID PANEL
Cholesterol: 200 mg/dL (ref 0–200)
LDL Cholesterol: 116 mg/dL — ABNORMAL HIGH (ref 0–99)
Total CHOL/HDL Ratio: 5 Ratio
VLDL: 44 mg/dL — ABNORMAL HIGH (ref 0–40)

## 2011-09-18 LAB — COMPREHENSIVE METABOLIC PANEL
ALT: 34 U/L (ref 0–35)
Alkaline Phosphatase: 54 U/L (ref 39–117)
BUN: 45 mg/dL — ABNORMAL HIGH (ref 6–23)
CO2: 23 mEq/L (ref 19–32)
Calcium: 9.4 mg/dL (ref 8.4–10.5)
Chloride: 107 mEq/L (ref 96–112)
Creat: 3.39 mg/dL — ABNORMAL HIGH (ref 0.50–1.10)
Glucose, Bld: 98 mg/dL (ref 70–99)

## 2011-09-18 LAB — CBC
MCH: 28.8 pg (ref 26.0–34.0)
MCV: 88.7 fL (ref 78.0–100.0)
Platelets: 265 10*3/uL (ref 150–400)
RBC: 4.06 MIL/uL (ref 3.87–5.11)

## 2011-11-22 ENCOUNTER — Encounter: Payer: PRIVATE HEALTH INSURANCE | Admitting: Internal Medicine

## 2011-12-13 ENCOUNTER — Encounter: Payer: PRIVATE HEALTH INSURANCE | Admitting: Internal Medicine

## 2011-12-19 ENCOUNTER — Encounter (HOSPITAL_COMMUNITY): Payer: Self-pay | Admitting: Pharmacy Technician

## 2011-12-25 ENCOUNTER — Other Ambulatory Visit: Payer: Self-pay | Admitting: Ophthalmology

## 2011-12-25 ENCOUNTER — Encounter: Payer: Self-pay | Admitting: Ophthalmology

## 2011-12-25 MED ORDER — APRACLONIDINE HCL 0.5 % OP SOLN: 1.0000 [drp] | Freq: Once | OPHTHALMIC | Status: DC

## 2011-12-25 NOTE — H&P (Signed)
  71 yo female with history of glaucoma.  Has been lost to follow up.  Found to have narrow angles on recent exam.  Admitted for yag laser iridotomy right eye.

## 2011-12-26 ENCOUNTER — Other Ambulatory Visit: Payer: Self-pay | Admitting: Ophthalmology

## 2011-12-26 ENCOUNTER — Ambulatory Visit (HOSPITAL_COMMUNITY)
Admission: RE | Admit: 2011-12-26 | Discharge: 2011-12-26 | Disposition: A | Discharge status: Home / Self Care | Payer: PRIVATE HEALTH INSURANCE | Source: Ambulatory Visit | Attending: Ophthalmology | Admitting: Ophthalmology

## 2011-12-26 ENCOUNTER — Encounter (HOSPITAL_COMMUNITY)
Admission: RE | Disposition: A | Discharge status: Home / Self Care | Payer: Self-pay | Source: Ambulatory Visit | Attending: Ophthalmology

## 2011-12-26 ENCOUNTER — Encounter (HOSPITAL_COMMUNITY): Payer: Self-pay | Admitting: *Deleted

## 2011-12-26 DIAGNOSIS — H402290 Chronic angle-closure glaucoma, unspecified eye, stage unspecified: Secondary | ICD-10-CM | POA: Insufficient documentation

## 2011-12-26 SURGERY — MINOR PERIPHERAL IRIDOTOMY
Anesthesia: Topical | Laterality: Right

## 2011-12-26 SURGERY — TREATMENT, USING YAG LASER
Anesthesia: LOCAL | Laterality: Right

## 2011-12-26 MED ORDER — TETRACAINE HCL 0.5 % OP SOLN
OPHTHALMIC | Status: AC
  Filled 2011-12-26: qty 2

## 2011-12-26 MED ORDER — APRACLONIDINE HCL 0.5 % OP SOLN: 1.0000 [drp] | OPHTHALMIC | Status: DC | PRN

## 2011-12-26 MED ORDER — TETRACAINE HCL 0.5 % OP SOLN
OPHTHALMIC | Status: DC | PRN
  Administered 2011-12-26: 1 [drp] via OPHTHALMIC

## 2011-12-26 MED ORDER — APRACLONIDINE HCL 0.5 % OP SOLN
OPHTHALMIC | Status: AC
  Filled 2011-12-26: qty 5

## 2011-12-26 MED ORDER — APRACLONIDINE HCL 1 % OP SOLN
1.0000 [drp] | OPHTHALMIC | Status: DC | PRN
  Administered 2011-12-26: 1 [drp] via OPHTHALMIC

## 2011-12-26 NOTE — Progress Notes (Signed)
Dr. Ricki Miller went over verbal instructions for discharge

## 2011-12-26 NOTE — Op Note (Signed)
[facility]                                 Minor Surgery Room Physician's Record                                  [x  ] Minor Procedure    yag Laser           12/26/2011   Brief History/ Findings: 71 yo female with chronic angle closure glaucoma admitted for yag laser iridotomy od.  Local Anesthetic :      tetracaine  Patient: Nancy [name]  Procedure(s) Performed:  Yag laser iridotomy right eye   Anesthesia type: topical  Patient location: Short Stay  Post pain: Pain level controlledPost assessment: Pain level controlled    Last Vitals: There were no vitals filed for this visit.  Post vital signs: stable  Level of consciousness: awake  Complications: No apparent anesthesia complications   Procedure:yag laser iridotomy  Total Energy   24 Total Bursts  2 Shots per Bursts 3 Energy per shot 4  Specimen Removed: (Type & Number) none  Disposition:  [  ] Pathology, Routine                        [  ] Other     [  ] Disposed of  Condition of Patient Post Procedure: [x  ]  Stable  [  ] Other n/a    Discharge Instructions:  [ x ]  Diet , regular   [  ] Other          Activity: [x]  No restrictions  [  ] Other          Medication: none          Follow-up: 5 PM today          Other: NA  Time:  12:59 PM    No name on file.

## 2011-12-26 NOTE — H&P (Signed)
  71 yo female with narrow angle glaucoma admitted for yag laser iridotomy right eye.

## 2012-01-03 ENCOUNTER — Other Ambulatory Visit: Payer: Self-pay | Admitting: *Deleted

## 2012-01-03 MED ORDER — CLONIDINE HCL 0.1 MG PO TABS: 0.1000 mg | ORAL_TABLET | Freq: Two times a day (BID) | ORAL | Status: DC

## 2012-01-03 NOTE — Telephone Encounter (Signed)
Flag sent to front desk pool for appt next 3 months with new PCP per Dr Lynnae January.

## 2012-01-03 NOTE — Telephone Encounter (Signed)
Pls make appt next 3 months with new PCP

## 2012-01-17 ENCOUNTER — Encounter (HOSPITAL_COMMUNITY): Payer: Self-pay | Admitting: Pharmacy Technician

## 2012-01-24 ENCOUNTER — Other Ambulatory Visit: Payer: Self-pay | Admitting: Ophthalmology

## 2012-01-24 ENCOUNTER — Encounter: Payer: Self-pay | Admitting: Ophthalmology

## 2012-01-24 ENCOUNTER — Ambulatory Visit (HOSPITAL_COMMUNITY)
Admission: RE | Admit: 2012-01-24 | Discharge: 2012-01-24 | Disposition: A | Discharge status: Home / Self Care | Payer: PRIVATE HEALTH INSURANCE | Source: Ambulatory Visit | Attending: Ophthalmology | Admitting: Ophthalmology

## 2012-01-24 ENCOUNTER — Encounter (HOSPITAL_COMMUNITY)
Admission: RE | Disposition: A | Discharge status: Home / Self Care | Payer: Self-pay | Source: Ambulatory Visit | Attending: Ophthalmology

## 2012-01-24 DIAGNOSIS — H402290 Chronic angle-closure glaucoma, unspecified eye, stage unspecified: Secondary | ICD-10-CM | POA: Insufficient documentation

## 2012-01-24 DIAGNOSIS — H409 Unspecified glaucoma: Secondary | ICD-10-CM | POA: Insufficient documentation

## 2012-01-24 SURGERY — MINOR PERIPHERAL IRIDOTOMY
Anesthesia: Topical | Laterality: Left

## 2012-01-24 SURGERY — TREATMENT, USING YAG LASER
Anesthesia: LOCAL | Laterality: Left

## 2012-01-24 MED ORDER — APRACLONIDINE HCL 0.5 % OP SOLN
OPHTHALMIC | Status: AC
  Filled 2012-01-24: qty 5

## 2012-01-24 MED ORDER — TETRACAINE HCL 0.5 % OP SOLN
OPHTHALMIC | Status: AC
  Filled 2012-01-24: qty 2

## 2012-01-24 MED ORDER — APRACLONIDINE HCL 1 % OP SOLN
1.0000 [drp] | Freq: Once | OPHTHALMIC | Status: AC
  Administered 2012-01-24: 1 [drp] via OPHTHALMIC

## 2012-01-24 MED ORDER — APRACLONIDINE HCL 0.5 % OP SOLN: 1.0000 [drp] | Freq: Once | OPHTHALMIC | Status: DC

## 2012-01-24 NOTE — H&P (Signed)
  71 yo female with narrow angle glaucoma has had laser iridotomy right eye.  Admitted for iridotomy left eye.

## 2012-01-24 NOTE — Brief Op Note (Signed)
01/24/2012  7:36 AM  PATIENT:  Nancy [name]  71 y.o. female  PRE-OPERATIVE DIAGNOSIS:  narrow angle glaucoma  POST-OPERATIVE DIAGNOSIS:  same  PROCEDURE:  Procedure(s) (LRB): YAG LASER iridotomy left eye  Total Energy    28 Total Bursts       6 Shots/Burst        3 Energy/Shot        5  SURGEON:  Rhian Asebedo    * Myrtha Mantis., MD - Primary  PHYSICIAN ASSISTANT: none  ASSISTANTS: none   ANESTHESIA:   topical  EBL:     BLOOD ADMINISTERED:none  DRAINS: none   LOCAL MEDICATIONS USED:  NONE  SPECIMEN:  No Specimen  DISPOSITION OF SPECIMEN:  N/A  COUNTS:  YES  TOURNIQUET:  * No tourniquets in log *  DICTATION: .Note written in EPIC  PLAN OF CARE: Discharge to home after PACU  PATIENT DISPOSITION:  Short Stay   Delay start of Pharmacological VTE agent (>24hrs) due to surgical blood loss or risk of bleeding: yes

## 2012-01-27 ENCOUNTER — Encounter (HOSPITAL_COMMUNITY): Payer: Self-pay

## 2012-01-27 MED FILL — Tetracaine HCl Ophth Soln 0.5%: OPHTHALMIC | Qty: 2 | Status: AC

## 2012-01-27 NOTE — Op Note (Signed)
NAME:  [place], [place]               ACCOUNT NO.:  [id number]  MEDICAL RECORD NO.:  [id number]  LOCATION:  [facility]                         FACILITY:  [facility]  PHYSICIAN:  Garlan Fair., M.D.DATE OF BIRTH:  April 17, 1941  DATE OF PROCEDURE: DATE OF DISCHARGE:  01/24/2012                              OPERATIVE REPORT   PREOPERATIVE DIAGNOSIS:  Narrow angle glaucoma, right eye.  POSTOPERATIVE DIAGNOSIS:  Narrow angle glaucoma, right eye.  OPERATION:  YAG laser iridotomy, right eye.  JUSTIFICATION FOR PROCEDURE:  This is a 71 year old lady who has chronic narrow angle glaucoma whose glaucoma has been controlled.  However, because the anterior chamber angles were even more compromised and previously YAG later capsulotomy was recommended, she was admitted to undergo the procedure.  PROCEDURE:  The patient was brought to the laser room and positioned appropriately Handy YAG laser.  Approximately, __________ of laser energy __________ portion of the iris of the right eye for total of energy of 24 millijoules.  The patient tolerated procedure well and discharged in satisfactory condition with instruction to continue her Travatan at night and use Pred Forte 4 times a day.  She is to see me in office this afternoon for further evaluation.  DISCHARGE DIAGNOSIS:  Narrow angle glaucoma, right eye.     Garlan Fair., M.D.     TB/MEDQ  D:  01/27/2012  T:  01/27/2012  Job:  [id number]

## 2012-01-27 NOTE — Op Note (Signed)
NAME:  Nancy [name], Nancy [name]               ACCOUNT NO.:  [id number]  MEDICAL RECORD NO.:  [id number]  LOCATION:  [facility]                         FACILITY:  [facility]  PHYSICIAN:  Garlan Fair., M.D.DATE OF BIRTH:  25-Feb-1941  DATE OF PROCEDURE: DATE OF DISCHARGE:  12/26/2011                              OPERATIVE REPORT   PREOPERATIVE DIAGNOSIS:  Narrow angle glaucoma, left eye.  POSTOPERATIVE DIAGNOSIS:  Narrow angle glaucoma, left eye.  OPERATION:  YAG laser iridotomy.  JUSTIFICATION FOR PROCEDURE:  This is a 71 year old lady who has chronic narrow angle glaucoma which pressure has presently been maintained with Travatan.  Gonioscopy showed the angles were getting even narrower. Therefore, she was recommended to have an iridotomy done.  She had an iridotomy of the right eye done approximately one month ago.  The patient tolerated procedure well and was discharged in satisfactory condition with instructions to continue her Travatan at night and to use Pred Forte drops at a left eye four times a day.  She is to see me in office this afternoon for further evaluation.  DISCHARGE DIAGNOSES:  Narrow angle glaucoma, left eye.  The patient was discharged in satisfactory condition with instructions to see me in office at 5 o'clock that afternoon because of transportation problems she could not come.     Garlan Fair., M.D.     TB/MEDQ  D:  01/27/2012  T:  01/27/2012  Job:  [id number]

## 2012-02-10 ENCOUNTER — Encounter: Payer: Self-pay | Admitting: Internal Medicine

## 2012-02-10 ENCOUNTER — Ambulatory Visit (INDEPENDENT_AMBULATORY_CARE_PROVIDER_SITE_OTHER): Payer: PRIVATE HEALTH INSURANCE | Admitting: Internal Medicine

## 2012-02-10 VITALS — BP 123/74 | HR 94 | Temp 97.4°F | Ht 62.0 in | Wt 194.3 lb

## 2012-02-10 DIAGNOSIS — N289 Disorder of kidney and ureter, unspecified: Secondary | ICD-10-CM

## 2012-02-10 DIAGNOSIS — N259 Disorder resulting from impaired renal tubular function, unspecified: Secondary | ICD-10-CM

## 2012-02-10 DIAGNOSIS — M1710 Unilateral primary osteoarthritis, unspecified knee: Secondary | ICD-10-CM

## 2012-02-10 DIAGNOSIS — Z0000 Encounter for general adult medical examination without abnormal findings: Secondary | ICD-10-CM

## 2012-02-10 DIAGNOSIS — I10 Essential (primary) hypertension: Secondary | ICD-10-CM

## 2012-02-10 DIAGNOSIS — E785 Hyperlipidemia, unspecified: Secondary | ICD-10-CM

## 2012-02-10 DIAGNOSIS — N189 Chronic kidney disease, unspecified: Secondary | ICD-10-CM

## 2012-02-10 DIAGNOSIS — Z79899 Other long term (current) drug therapy: Secondary | ICD-10-CM

## 2012-02-10 LAB — COMPLETE METABOLIC PANEL WITH GFR
ALT: <8 U/L (ref 0–35)
CO2: 22 mEq/L (ref 19–32)
Calcium: 9.4 mg/dL (ref 8.4–10.5)
Chloride: 109 mEq/L (ref 96–112)
GFR, Est African American: 13 mL/min — ABNORMAL LOW
Sodium: 141 mEq/L (ref 135–145)
Total Bilirubin: 0.3 mg/dL (ref 0.3–1.2)
Total Protein: 6.9 g/dL (ref 6.0–8.3)

## 2012-02-10 LAB — POCT GLYCOSYLATED HEMOGLOBIN (HGB A1C): Hemoglobin A1C: 5.4

## 2012-02-10 NOTE — Assessment & Plan Note (Signed)
BP at goal, 123/71 at this visit.  Continue amlodipine and clonidine.  If patient's kidney function is worsening and/or she has proteinuria, will consider switching to ACE inhibitor and weaning off clonidine.

## 2012-02-10 NOTE — Assessment & Plan Note (Signed)
Last LDL was 116 on 09/17/11.  She has risk factors for CHD including age and HTN, however, she still falls below 130, which would be the goal for her.  However, if she does have worsening renal disease, one could argue for a goal of 100.  Will assess further at next visit.  Will need fasting labs prior to next visit.

## 2012-02-10 NOTE — Assessment & Plan Note (Signed)
Ms. Kemmerlin continues to have right sided knee pain which does limit her ability to exercise.  Advised continued use of tylenol for pain and to avoid NSAIDs at this time due to renal insufficiency.  Patient not interested in PT.  Advised water therapy/exercise, however, Ms. Imdieke does not have easy access to a pool.  Could consider PT with aquatherapy in the future.  Will continue to discuss with the patient.  Further options could be joint injections.

## 2012-02-10 NOTE — Patient Instructions (Addendum)
Please stop by the lab and get blood work before leaving.  I will call you with the results.   Please continue your current medications as previously prescribed.  Continue taking tylenol for your knee pain.  Please do not take over the counter NSAIDs (aleve, advil) because they can harm your kidneys.   Your kidney function is getting a little bit worse.  We will discuss this further at the next visit after getting some lab work.    Chronic Renal Insufficiency Chronic renal insufficiency (also called kidney failure) occurs when there is kidney damage done. The damage prevents the kidneys from working like they should.  The kidneys do many important things. They:  Filter waste out of the blood.   Regulate the amount of water and various salts in the blood stream.   Produce chemicals that:   Prompt the bone marrow to make red blood cells.   Regulate blood pressure.   Keep calcium in balance throughout the bones and the body.  When the kidneys are damaged, they can no longer filter waste products out of the blood. These substances build up in the blood, causing illness.  CAUSES   Diabetes.   High blood pressure.   Glomerular diseases: Conditions that damage the tiny blood vessels (glomeruli) within the kidneys, such as:   Membranous nephropathy.   IgA nephropathy.   Focal segmental glomerulosclerosis.   Poisons (such as overdoses or misuse of acetaminophen or NSAIDS, or exposure to other toxic substances).   Kidney injuries.   Medications such as NSAIDs. These problems are rare.   Kidney stones.   Alport disease.   Polycystic kidneys.  SYMPTOMS  Most people do not notice symptoms of kidney failure until their kidney function drops below about 30-40% of normal. Symptoms can include:  Weakness.   Tiredness.   Frequent urination.   Intense need to urinate.   Excess bruising.   Low urine production.   Blood in the urine.   Pain in the kidney area.   Feeling  sick to your stomach (nausea).   Vomiting.   Unusual bleeding.   Numbness in hands and feet.   Swelling in legs, arms and face.   Confusion.  TREATMENT  Chronic kidney failure cannot usually be cured. The various symptoms are treated, and measures are taken to avoid further kidney damage. Treatment for mild to moderate kidney failure may include:  Medication for high blood pressure.   Good control of diabetes.   Medication and diet change to improve anemia.   A low-sodium, low-potassium, low-protein and/or low-cholesterol diet.   Limiting the quantity of liquids in the diet.  Treatment for more severe kidney failure may require:  Dialysis - Mechanical methods of filtering the blood.   Kidney transplant - An operation that removes the diseased kidney and replaces it with a donated kidney.  HOME CARE INSTRUCTIONS   Take medication as told by your caregiver.   Quit smoking if you are a smoker. Talk to your caregiver about a smoking cessation program.   Follow your prescribed diet.   If you are prescribed vitamins, take them as told.  SEEK IMMEDIATE MEDICAL CARE IF:  You start to produce less urine.   You notice blood in your urine.   You have increased pain.   You have increased weakness, fatigue or confusion.   You notice new swelling.   You develop a fever.   You feel that you are having side effects of medicines prescribed.  Document Released:  04/02/2008 Document Revised: 06/13/2011 Document Reviewed: 07/16/2010 [facility] Patient Information 2012 [place].

## 2012-02-10 NOTE — Assessment & Plan Note (Signed)
Last labs reveal a concerning increase in her Cr to 3.39 up from 2.96.  Will recheck at this visit.  Likely causes include hypertension and diabetes in this patient.  HTN is currently well controlled.  Nancy [name] reports no history of CKD in her family and no history of DM.  Will also check A1C to assess for diabetes and a urinalysis to look for protein.  If proteinuria present, can consider adding an ACE inhibitor to her regimen.  Could also consider tighter HLD control with addition of a low dose of a statin.  She currently does not have any signs/symptoms of obstruction and no history of nephrolithiasis per my review.

## 2012-02-10 NOTE — Progress Notes (Signed)
Subjective:    Patient ID: Nancy [name], female    DOB: 12-27-1940, 71 y.o.   MRN: [id number]  CC: Establish with new PCP  HPI  Ms. Lusch is a 71yo African American Woman who presents today to establish in my clinic.  Ms. Benda reports that her only complaint is continued right knee pain for which she has been told she has osteoarthritis and almost no cartilage in that knee.  She is not interested in PT at this time.  She is taking tylenol OTC for the pain with some relief.  She was advised not to take NSAIDs because of her kidney disease.   Other issues include hypertension, which is well controlled on amlodipine and clonidine at this time.  She also has reported HLD with a last LDL of 116 noted.   Ms. Wease does have a history of renal insufficiency.  Her most recent Cr was in March of this year and was 3.39, which is elevated compared to previous levels.  Per chart review, she has had a continued increase in her Cr since 2008.  Will recheck level today.    Ms. Kalfas has requested advice concerning weight loss.  Because of her knee, she has difficulty with load bearing exercise.  Discussed options for exercise in water, counting calories and decreasing foods high in fat.  Will continue to discuss/council at further visits.   Review of Systems  Constitutional: Negative for activity change, appetite change and fatigue.  HENT: Negative for hearing loss, congestion, sore throat, rhinorrhea, trouble swallowing, voice change and sinus pressure.   Eyes: Negative for pain and visual disturbance.  Respiratory: Negative for cough, chest tightness and shortness of breath.   Cardiovascular: Negative for chest pain and leg swelling.  Gastrointestinal: Negative for nausea, vomiting, abdominal pain, diarrhea, constipation and abdominal distention.  Genitourinary: Negative for dysuria, frequency, flank pain, decreased urine volume and difficulty urinating.  Musculoskeletal: Positive for arthralgias.  Negative for joint swelling.       Right Knee pain secondary to arthritis  Skin: Negative for color change and rash.  Neurological: Negative for weakness, light-headedness and headaches.  Hematological: Does not bruise/bleed easily.  Psychiatric/Behavioral: Negative for behavioral problems and confusion.       Objective:   Physical Exam  Constitutional: She is oriented to person, place, and time. No distress.  HENT:  Head: Normocephalic and atraumatic.  Eyes: Conjunctivae and EOM are normal. Pupils are equal, round, and reactive to light. No scleral icterus.  Neck: Normal range of motion. Neck supple. Carotid bruit is not present. No thyromegaly present.  Cardiovascular: Normal rate, regular rhythm and normal heart sounds.   No murmur heard. Pulses:      Radial pulses are 2+ on the right side, and 2+ on the left side.       Posterior tibial pulses are 2+ on the right side, and 2+ on the left side.  Pulmonary/Chest: Effort normal and breath sounds normal. She has no wheezes.  Abdominal: Soft. Bowel sounds are normal. She exhibits no mass. There is no tenderness. There is no guarding.  Musculoskeletal: She exhibits no edema and no tenderness.       + crepitus of bilateral knees  Lymphadenopathy:    She has no cervical adenopathy.  Neurological: She is alert and oriented to person, place, and time. No cranial nerve deficit.  Skin: Skin is warm and dry. No rash noted. She is not diaphoretic.  Psychiatric: She has a normal mood and affect. Her  behavior is normal.      Assessment & Plan:  Please see problem based A&P.

## 2012-02-10 NOTE — Assessment & Plan Note (Signed)
Currently UTD on immunizations except for Zostavax.  Nancy [name] would like to defer this at this time secondary to insurance concerns.    Colonoscopy performed in 2008, revealing a polyp (no pathology for me to review at this time) and GI docs recommended repeat in 5 years.   Mammogram due, patient reports she should have it scheduled in September.   Will assess need for Pelvic/Pap at next visit.

## 2012-02-11 ENCOUNTER — Other Ambulatory Visit: Payer: Self-pay | Admitting: Internal Medicine

## 2012-02-11 DIAGNOSIS — Z1231 Encounter for screening mammogram for malignant neoplasm of breast: Secondary | ICD-10-CM

## 2012-02-11 LAB — URINALYSIS, ROUTINE W REFLEX MICROSCOPIC
Glucose, UA: NEGATIVE mg/dL
Ketones, ur: NEGATIVE mg/dL
Specific Gravity, Urine: 1.024 (ref 1.005–1.030)
Urobilinogen, UA: 0.2 mg/dL (ref 0.0–1.0)

## 2012-02-11 LAB — URINALYSIS, MICROSCOPIC ONLY
Casts: NONE SEEN
Crystals: NONE SEEN

## 2012-02-12 ENCOUNTER — Telehealth: Payer: Self-pay | Admitting: Internal Medicine

## 2012-02-12 DIAGNOSIS — N185 Chronic kidney disease, stage 5: Secondary | ICD-10-CM

## 2012-02-12 NOTE — Telephone Encounter (Signed)
Called and spoke with Nancy [name] concerning her lab results.  She had a normal A1C, however, her Creatinine continues to rise and now is 3.85.  Estimation of her GFR puts her at 55-14 which places her in the G5 stage of CKD.  Urinalysis also showed proteinuria and asymptomatic bactiuria.  Discussed options including restarting her lisinopril at a low dose and the need for her to see a Nephrology specialist.  Nancy [name] would prefer not to start any new medications at this time.  She would prefer to discuss her options further with the nephrologist.  Nancy [name] was previously on Lisinopril until 2010 when it was d/c'd in favor of clonidine for blood pressure control.  I will place consult for [facility] Nephrology today and continue following Nancy [name] in my clinic as well.

## 2012-02-17 ENCOUNTER — Encounter: Payer: Self-pay | Admitting: Internal Medicine

## 2012-02-18 ENCOUNTER — Encounter: Payer: Self-pay | Admitting: Internal Medicine

## 2012-02-28 ENCOUNTER — Ambulatory Visit (HOSPITAL_COMMUNITY)
Admission: RE | Admit: 2012-02-28 | Discharge: 2012-02-28 | Disposition: A | Discharge status: Home / Self Care | Payer: PRIVATE HEALTH INSURANCE | Source: Ambulatory Visit | Attending: Internal Medicine | Admitting: Internal Medicine

## 2012-02-28 DIAGNOSIS — Z1231 Encounter for screening mammogram for malignant neoplasm of breast: Secondary | ICD-10-CM | POA: Insufficient documentation

## 2012-03-05 ENCOUNTER — Ambulatory Visit: Payer: PRIVATE HEALTH INSURANCE | Admitting: Internal Medicine

## 2012-03-18 ENCOUNTER — Encounter: Payer: Self-pay | Admitting: Internal Medicine

## 2012-03-18 DIAGNOSIS — H409 Unspecified glaucoma: Secondary | ICD-10-CM | POA: Insufficient documentation

## 2012-03-20 ENCOUNTER — Emergency Department (HOSPITAL_COMMUNITY)
Admission: EM | Admit: 2012-03-20 | Discharge: 2012-03-20 | Disposition: A | Discharge status: Home / Self Care | Payer: PRIVATE HEALTH INSURANCE | Attending: Emergency Medicine | Admitting: Emergency Medicine

## 2012-03-20 ENCOUNTER — Encounter (HOSPITAL_COMMUNITY): Payer: Self-pay

## 2012-03-20 DIAGNOSIS — M7020 Olecranon bursitis, unspecified elbow: Secondary | ICD-10-CM | POA: Insufficient documentation

## 2012-03-20 DIAGNOSIS — M719 Bursopathy, unspecified: Secondary | ICD-10-CM

## 2012-03-20 DIAGNOSIS — I10 Essential (primary) hypertension: Secondary | ICD-10-CM | POA: Insufficient documentation

## 2012-03-20 DIAGNOSIS — M1990 Unspecified osteoarthritis, unspecified site: Secondary | ICD-10-CM | POA: Insufficient documentation

## 2012-03-20 DIAGNOSIS — Z8673 Personal history of transient ischemic attack (TIA), and cerebral infarction without residual deficits: Secondary | ICD-10-CM | POA: Insufficient documentation

## 2012-03-20 MED ORDER — SULFAMETHOXAZOLE-TRIMETHOPRIM 800-160 MG PO TABS: 1.0000 | ORAL_TABLET | Freq: Two times a day (BID) | ORAL | Status: AC

## 2012-03-20 MED ORDER — CEPHALEXIN 250 MG PO CAPS: 250.0000 mg | ORAL_CAPSULE | Freq: Four times a day (QID) | ORAL | Status: AC

## 2012-03-20 MED ORDER — IBUPROFEN 400 MG PO TABS: 400.0000 mg | ORAL_TABLET | Freq: Four times a day (QID) | ORAL | Status: DC | PRN

## 2012-03-20 NOTE — ED Notes (Signed)
Pt complains of right elbow pain, onset three days ago, sts may be abscess or knot. Some swelling noted. Full ROM

## 2012-03-20 NOTE — ED Provider Notes (Signed)
History   This chart was scribed for Julianne Rice, MD by Marin Comment . The patient was seen in room TR04C/TR04C. Patient's care was started at 1519.    CSN: [id number]  Arrival date & time 03/20/12  1450   First MD Initiated Contact with Patient 03/20/12 1519      Chief Complaint  Patient presents with  . Cyst    (Consider location/radiation/quality/duration/timing/severity/associated sxs/prior treatment) HPI Nancy [name] is a 71 y.o. female who presents to the Emergency Department complaining of constant, moderate right elbow pain with an associated mass to her right elbow for the past 3 days. Pt reports some associated swelling. Pt denies any excessive use of her elbow and states that she has ROM. Pt denies any other complaints of pain at this time.   Past Medical History  Diagnosis Date  . CVA (cerebrovascular accident)     New hemorrhagic per CT scan '09  . Fecal impaction   . Diverticulosis of colon   . Hypertension   . Renal insufficiency   . Pulmonary nodule   . Dysfunctional uterine bleeding   . Postmenopausal   . OA (osteoarthritis)     bilateral knees  . Colitis   . Bacterial sinusitis 09/17/2011  . TINEA CRURIS 01/12/2007    Qualifier: Diagnosis of  By: Venia Carbon MD, Clelia Schaumann    . HERNIORRHAPHY, HX OF 08/11/2006    Qualifier: Diagnosis of  By: Venia Carbon MD, Clelia Schaumann      Past Surgical History  Procedure Date  . Inguinal hernia repair 2008  . Abdominal hysterectomy   . Cholecystectomy 2009  . Iridotomy / iridectomy     Laser, right eye 12/26/11 left eye 01/24/12    Family History  Problem Relation Age of Onset  . Hypertension Mother   . Heart attack Mother   . Heart disease Mother     History  Substance Use Topics  . Smoking status: Never Smoker   . Smokeless tobacco: Not on file  . Alcohol Use: No    OB History    Grav Para Term Preterm Abortions TAB SAB Ect Mult Living                  Review of Systems A complete 10 system review  of systems was obtained and all systems are negative except as noted in the HPI and PMH.   Allergies  Hydrocodone-acetaminophen  Home Medications   Current Outpatient Rx  Name Route Sig Dispense Refill  . AMLODIPINE BESYLATE 10 MG PO TABS Oral Take 1 tablet (10 mg total) by mouth daily. 30 tablet 6  . CLONIDINE HCL 0.1 MG PO TABS Oral Take 0.1 mg by mouth 2 (two) times daily.    Marland Kitchen FAMOTIDINE 40 MG PO TABS Oral Take 1 tablet (40 mg total) by mouth daily. 30 tablet 11  . TRAVOPROST (BAK FREE) 0.004 % OP SOLN Both Eyes Place 1 drop into both eyes at bedtime.    . CEPHALEXIN 250 MG PO CAPS Oral Take 1 capsule (250 mg total) by mouth 4 (four) times daily. 40 capsule 0  . IBUPROFEN 400 MG PO TABS Oral Take 1 tablet (400 mg total) by mouth every 6 (six) hours as needed for pain. 30 tablet 0  . SULFAMETHOXAZOLE-TRIMETHOPRIM 800-160 MG PO TABS Oral Take 1 tablet by mouth 2 (two) times daily. 20 tablet 0    BP 149/68  Pulse 87  Temp 98 F (36.7 C) (Oral)  Resp 18  SpO2 96%  Physical Exam  Nursing note and vitals reviewed. Constitutional: She is oriented to person, place, and time. She appears well-developed and well-nourished. No distress.  HENT:  Head: Normocephalic and atraumatic.  Eyes: EOM are normal.  Neck: Neck supple. No tracheal deviation present.  Cardiovascular: Normal rate.        Distal pulses intact.   Pulmonary/Chest: Effort normal. No respiratory distress.  Musculoskeletal: Normal range of motion.       Tenderness to palpation of the right elbow over the acromial process with fluctuance noted. Full ROM of elbow without pain.  No obvious signs of infection in the skin.   Neurological: She is alert and oriented to person, place, and time.  Skin: Skin is warm and dry.  Psychiatric: She has a normal mood and affect. Her behavior is normal.    ED Course  Procedures (including critical care time)  DIAGNOSTIC STUDIES: Oxygen Saturation is 96% on room air, adequate by my  interpretation.    COORDINATION OF CARE:  16:24-Discussed planned course of treatment with the patient including anti-inflammatories, who is agreeable at this time.      Labs Reviewed  WOUND CULTURE   No results found.   1. Bursitis       MDM  I personally performed the services described in this documentation, which was scribed in my presence. The recorded information has been reviewed and considered.     Julianne Rice, MD 03/20/12 [phone]

## 2012-03-23 ENCOUNTER — Ambulatory Visit (INDEPENDENT_AMBULATORY_CARE_PROVIDER_SITE_OTHER): Payer: PRIVATE HEALTH INSURANCE | Admitting: Internal Medicine

## 2012-03-23 ENCOUNTER — Encounter: Payer: Self-pay | Admitting: Internal Medicine

## 2012-03-23 VITALS — BP 143/74 | HR 87 | Temp 96.8°F | Resp 20 | Ht 62.0 in | Wt 199.5 lb

## 2012-03-23 DIAGNOSIS — N259 Disorder resulting from impaired renal tubular function, unspecified: Secondary | ICD-10-CM

## 2012-03-23 DIAGNOSIS — M1710 Unilateral primary osteoarthritis, unspecified knee: Secondary | ICD-10-CM

## 2012-03-23 DIAGNOSIS — I10 Essential (primary) hypertension: Secondary | ICD-10-CM

## 2012-03-23 DIAGNOSIS — Z23 Encounter for immunization: Secondary | ICD-10-CM

## 2012-03-23 DIAGNOSIS — Z0000 Encounter for general adult medical examination without abnormal findings: Secondary | ICD-10-CM

## 2012-03-23 LAB — BASIC METABOLIC PANEL WITH GFR
Chloride: 107 mEq/L (ref 96–112)
Creat: 3.32 mg/dL — ABNORMAL HIGH (ref 0.50–1.10)
GFR, Est African American: 16 mL/min — ABNORMAL LOW
GFR, Est Non African American: 14 mL/min — ABNORMAL LOW
Potassium: 4.2 mEq/L (ref 3.5–5.3)

## 2012-03-23 MED ORDER — CLONIDINE HCL 0.2 MG PO TABS: 0.2000 mg | ORAL_TABLET | Freq: Two times a day (BID) | ORAL | Status: DC

## 2012-03-23 NOTE — Assessment & Plan Note (Signed)
Pt aware that she needs a repeat colonoscopy- she was told by a physician (unsure who) that she could wait until she is 89, which will be in Feb 2014.

## 2012-03-23 NOTE — Patient Instructions (Signed)
Continue Amlodipine 10mg  daily.   Begin taking 2 of your Clonidine 0.1mg  tablets twice a day until you are able to get your prescription for Clonidine 0.2mg  filled at the pharmacy. After filling the prescription, take one Clonidine 0.2mg  tablet twice daily.  Keep your appointment at [facility] for the 27th of September.   Stop taking Ibuprofen or any other NSAIDs (nonsteroidal antiinflamatory drugs), including Ibuprofen, Asprin, Goody's powders, ... Tylenol is ok to take.  Please be sure to get your colonoscopy, you are due for one. Your last colonoscopy was in 2008.  Kidney Failure Kidney failure happens when the kidneys cannot remove waste and excess fluid that naturally builds up in your blood after your body breaks down food. This leads to a dangerous buildup of waste products and fluid in the blood. HOME CARE  Follow your diet as told by your doctor.   Take all medicines as told by your doctor.   Keep all of your dialysis appointments. Call if you are unable to keep an appointment.  GET HELP RIGHT AWAY IF:    You make a lot more or very little pee (urine).   Your face or ankles puff up (swell).   You develop shortness of breath.   You develop weakness, feel tired, or you do not feel hungry (appetite loss).   You feel poorly for no known reason.  MAKE SURE YOU:    Understand these instructions.   Will watch your condition.   Will get help right away if you are not doing well or get worse.  Document Released: 09/18/2009 Document Revised: 06/13/2011 Document Reviewed: 10/25/2009 [facility] Patient Information 2012 [place].

## 2012-03-23 NOTE — Assessment & Plan Note (Addendum)
Pt states that she was told to take Ibuprofen 200mg  daily by the ED for pain. Told pt to stop the Ibuprofen or any NSAID due to renal insufficiency, and advised her to try Tylenol for pain.

## 2012-03-23 NOTE — Progress Notes (Signed)
Patient ID: Nancy [name], female   DOB: January 14, 1941, 71 y.o.   MRN: [id number]  Subjective:   Patient ID: Nancy E Granier female   DOB: Feb 15, 1941 71 y.o.   MRN: [id number]  HPI: Ms.Disa E Vander is a 71 y.o. F w/ PMH HTN, CKD, and right knee DJD, who presents to the clinic for a f/u due to increasing Cr levels. She was seen by Dr. Daryll Drown 02/10/12 to be est as a new pt for her HTN and increasing Cr levels from 2.26 to 3.39 on 09/17/11, and up to 3.85 on 02/10/12.   She is taking amlodipine 10mg  daily and Clonidine 0.1mg  BID, and when Dr. Daryll Drown saw the pt, her BP appeared well controlled. Due to her rising Cr levels, Dr. Daryll Drown attempted to start the pt on Lisinopril, but the pt did not want to change any of her therapy at that time. She denies any headaches or dizziness.  Her GFR on 02/10/12 was 13, which is Grade V CKD. She was referred to Nephrology, and is to see [place] on 04/03/12. She states that she had a renal u/s on 03/19/12, but is unsure of the results. She does still make urine.  She does have a h/o DJD in her right knee and is on daily 200mg  Ibuprofen for this.   She was seen in the ED on 9/13 for a sore elbow. In the ED, her elbow was noted to be tender with some fluctuance, and it was aspirated. Cultures were sent and are negative to date, but she was placed on Keflex and Septra DS for coverage. She did not start the abx until the night of 03/22/12. She still has some tenderness with movement of her elbow, but she states that it is improved from Friday.      Past Medical History  Diagnosis Date  . CVA (cerebrovascular accident)     New hemorrhagic per CT scan '09  . Fecal impaction   . Diverticulosis of colon   . Hypertension   . Renal insufficiency   . Pulmonary nodule   . Dysfunctional uterine bleeding   . Postmenopausal   . OA (osteoarthritis)     bilateral knees  . Colitis   . Bacterial sinusitis 09/17/2011  . TINEA CRURIS 01/12/2007    Qualifier:  Diagnosis of  By: Venia Carbon MD, Clelia Schaumann    . HERNIORRHAPHY, HX OF 08/11/2006    Qualifier: Diagnosis of  By: Venia Carbon MD, [facility]     Current Outpatient Prescriptions  Medication Sig Dispense Refill  . amLODipine (NORVASC) 10 MG tablet Take 1 tablet (10 mg total) by mouth daily.  30 tablet  6  . famotidine (PEPCID) 40 MG tablet Take 1 tablet (40 mg total) by mouth daily.  30 tablet  11  . Travoprost, BAK Free, (TRAVATAN) 0.004 % SOLN ophthalmic solution Place 1 drop into both eyes at bedtime.      . cephALEXin (KEFLEX) 250 MG capsule Take 1 capsule (250 mg total) by mouth 4 (four) times daily.  40 capsule  0  . cloNIDine (CATAPRES) 0.2 MG tablet Take 1 tablet (0.2 mg total) by mouth 2 (two) times daily.  60 tablet  11  . sulfamethoxazole-trimethoprim (SEPTRA DS) 800-160 MG per tablet Take 1 tablet by mouth 2 (two) times daily.  20 tablet  0   Family History  Problem Relation Age of Onset  . Hypertension Mother   . Heart attack Mother   . Heart disease Mother    History  Social History  . Marital Status: Married    Spouse Name: N/A    Number of Children: N/A  . Years of Education: N/A   Social History Main Topics  . Smoking status: Never Smoker   . Smokeless tobacco: None  . Alcohol Use: No  . Drug Use: Yes     08/15/08 UDS + cocaine  . Sexually Active: None   Other Topics Concern  . None   Social History Narrative   Married, lives with her husband. 1 child.    Review of Systems: Constitutional: Denies fever, chills, diaphoresis, appetite change and fatigue.  HEENT: Denies photophobia, eye pain, redness, hearing loss, ear pain, congestion, sore throat, rhinorrhea, sneezing, mouth sores, trouble swallowing, neck pain, neck stiffness and tinnitus.   Respiratory: Denies SOB, DOE, cough, chest tightness,  and wheezing.   Cardiovascular: Denies chest pain, palpitations and leg swelling.  Gastrointestinal: Denies nausea, vomiting, abdominal pain, diarrhea, constipation, blood in  stool and abdominal distention.  Genitourinary: Denies dysuria, urgency, frequency, hematuria, flank pain and difficulty urinating.  Musculoskeletal: Endorses R knee pain and mild pain in her right elbow.  Skin: Denies pallor, rash and wound.  Neurological: Denies dizziness, seizures, syncope, weakness, light-headedness, numbness and headaches.  Hematological: Denies adenopathy. Easy bruising, personal or family bleeding history  Psychiatric/Behavioral: Denies suicidal ideation, mood changes, confusion, nervousness, sleep disturbance and agitation  Objective:  Physical Exam: Filed Vitals:   03/23/12 1510 03/23/12 1515  BP: 145/83 143/74  Pulse: 87   Temp: 96.8 F (36 C)   TempSrc: Oral   Resp: 20   Height: 5\' 2"  (1.575 m)   Weight: 199 lb 8 oz (90.493 kg)   SpO2: 99%    Constitutional: Vital signs reviewed.  Patient is a well-developed and well-nourished female in no acute distress and cooperative with exam. Alert and oriented x3.  Head: Normocephalic and atraumatic Ear: TM normal bilaterally Mouth: Poor dentition. No erythema or exudates, MMM Eyes: PERRL, EOMI, conjunctivae normal, No scleral icterus.  Neck: Supple, Trachea midline normal ROM, no JVD, mass, or thyromegaly.  Cardiovascular: RRR, no MRG, pulses symmetric and intact bilaterally Pulmonary/Chest: CTAB, no wheezes, rales, or rhonchi Abdominal: Obese, soft, non-tender, non-distended, no masses, organomegaly, or guarding present.  GU: No CVA tenderness Musculoskeletal: +crepitus in B knees. Mild TTP in right elbow w/ full ROM. No joint deformities, erythema, or stiffness, ROM full. Hematology: No appreciable adenopathy.  Neurological: A&O x3, Strength is normal and symmetric bilaterally, cranial nerve II-XII are grossly intact, no focal motor deficit, sensory intact to light touch bilaterally.  Skin: Warm, dry and intact. No rash, cyanosis, or clubbing.  Psychiatric: Normal mood and affect. Speech and behavior is normal.  Judgment and thought content normal. Cognition and memory appear normal.   Assessment & Plan:   Please see Problem List based A&P.

## 2012-03-23 NOTE — Assessment & Plan Note (Signed)
Cr up to 3.85 on 8/5 w/ GFR 13. Checking BMP today. Pt states that she had a renal u/s on 03/19/12 and is to f/u with [facility] on Sept 27th. We will obtain these records. She was advised to stop the Ibuprofen and all other NSAIDs due to her renal insufficiency and to take Tylenol as needed. Discusses starting an ACEi with [name], but she is uninterested in beginning a new medication until she sees the Nephrologist.

## 2012-03-23 NOTE — Assessment & Plan Note (Signed)
Her BP is elevated at her clinic visit even though the pt endorses compliance w/ her Amlodipine 10mg  daily and Clonidine 0.1mg  BID. She was uninterested in beginning Lisinopril, but was amenable to increasing her dose of the Clonidine to 0.2mg  BID. She states that she wants to hear what the Nephrologist thinks about her HTN and CKD.

## 2012-03-24 LAB — WOUND CULTURE: Culture: NO GROWTH

## 2012-03-24 NOTE — Progress Notes (Signed)
INTERNAL MEDICINE TEACHING ATTENDING ADDENDUM Nancy [name] , MD: I personally saw and evaluated Nancy [name] in this clinic visit in conjunction with the resident, Dr. Eulas Post. I have discussed the patient's plan of care with Dr. Eulas Post during this visit. I have confirmed the physical exam findings and have read and agree with the clinic note including the plan.Its very important to establish communication with her nephrologist at [facility] for better management of her BP.

## 2012-04-03 ENCOUNTER — Telehealth: Payer: Self-pay | Admitting: *Deleted

## 2012-04-03 DIAGNOSIS — I10 Essential (primary) hypertension: Secondary | ICD-10-CM

## 2012-04-03 MED ORDER — AMLODIPINE BESYLATE 10 MG PO TABS: 10.0000 mg | ORAL_TABLET | Freq: Every day | ORAL | Status: DC

## 2012-04-03 NOTE — Telephone Encounter (Signed)
Pt informed and I will call [facility] on Monday for office notes.

## 2012-04-03 NOTE — Telephone Encounter (Signed)
Amlodipine was refilled, but we need the records from the Nephrologist at [facility].

## 2012-04-03 NOTE — Telephone Encounter (Signed)
Pt called stating she saw her kidney doctor and was told the med we put her on was to strong. She was put on Clonidine 0.2 mg. She was told to go back on amlodipine 10 mg.  She is out of this med and wants a refill. [facility] on [address]. Pt # [id number]

## 2012-04-07 NOTE — Telephone Encounter (Signed)
Office notes from [facility] are in your box in the clinic.

## 2012-05-02 ENCOUNTER — Other Ambulatory Visit: Payer: Self-pay | Admitting: Internal Medicine

## 2012-05-06 ENCOUNTER — Encounter: Payer: Self-pay | Admitting: Internal Medicine

## 2012-05-06 NOTE — Progress Notes (Signed)
  This patient is a CHRONIC NO-SHOW PATIENT that has a history of HYPERTENSION.  Please make sure to address hypertension during her next clinic appointment, and intervene as appropriate.    Within the AVS, please incorporate the following smartphrase: .htntips   Pertinent Data: BP Readings from Last 3 Encounters:  03/23/12 143/74  03/20/12 149/68  02/10/12 123/74    BMI: Estimated Body mass index is 36.49 kg/(m^2) as calculated from the following:   Height as of 03/23/12: 5\' 2" (1.575 m).   Weight as of 03/23/12: 199 lb 8 oz(90.493 kg).  Smoking History: History  Smoking status  . Never Smoker   Smokeless tobacco  . Not on file    Last Basic Metabolic Panel:    Component Value Date/Time   NA 139 03/23/2012 1627   K 4.2 03/23/2012 1627   CL 107 03/23/2012 1627   CO2 19 03/23/2012 1627   BUN 32* 03/23/2012 1627   CREATININE 3.32* 03/23/2012 1627   CREATININE 2.96* 05/14/2010 2143   CREATININE 2.06* 08/17/2008 0921   GLUCOSE 88 03/23/2012 1627   CALCIUM 9.7 03/23/2012 1627    Allergies: Allergies  Allergen Reactions  . Hydrocodone-Acetaminophen     REACTION: Nausea, vomiting and dizziness.

## 2012-05-11 ENCOUNTER — Ambulatory Visit: Payer: PRIVATE HEALTH INSURANCE | Admitting: Internal Medicine

## 2012-05-11 ENCOUNTER — Encounter: Payer: Self-pay | Admitting: Internal Medicine

## 2012-05-15 ENCOUNTER — Encounter: Payer: Self-pay | Admitting: Internal Medicine

## 2012-05-15 ENCOUNTER — Ambulatory Visit (INDEPENDENT_AMBULATORY_CARE_PROVIDER_SITE_OTHER): Payer: PRIVATE HEALTH INSURANCE | Admitting: Internal Medicine

## 2012-05-15 VITALS — BP 142/70 | HR 78 | Temp 97.1°F | Ht 62.0 in | Wt 198.0 lb

## 2012-05-15 DIAGNOSIS — M1710 Unilateral primary osteoarthritis, unspecified knee: Secondary | ICD-10-CM

## 2012-05-15 DIAGNOSIS — I10 Essential (primary) hypertension: Secondary | ICD-10-CM

## 2012-05-15 DIAGNOSIS — Z0000 Encounter for general adult medical examination without abnormal findings: Secondary | ICD-10-CM

## 2012-05-15 NOTE — Progress Notes (Signed)
  Subjective:    Patient ID: Nancy [name], female    DOB: 1941-05-07, 71 y.o.   MRN: [id number]  CC: right knee pain  HPI: This is a 71 year old woman with hypertension, stage IV chronic kidney disease, and chronic right knee pain who presents today again complaining of right knee pain.  This has been a problem for this patient for years and she has previously received injections to this joint with good response in the past.  Her last joint injection was 1 year ago.  The pain is worse in the morning and in the evening.  It is disturbing her sleep.  It takes about 15 minutes in the morning to work stiffness out of the joint.  She is able to do the things she wants to do during the day, she just has pain doing it.  She does not take anything for the pain because she is worried all OTC analgesics will injure her kidneys.  On review of systems, she denies paraesthesias, weakness, and falls.     Review of Systems  Respiratory: Negative for cough and shortness of breath.   Cardiovascular: Negative for chest pain, palpitations and leg swelling.  Gastrointestinal: Negative for nausea, vomiting, abdominal pain, diarrhea, constipation and blood in stool.  Genitourinary: Negative for dysuria and hematuria.  Musculoskeletal: Positive for arthralgias. Negative for myalgias, joint swelling and gait problem.  Neurological: Negative for dizziness, weakness, light-headedness, numbness and headaches.       Objective:   Physical Exam GENERAL: obese; no acute distress LUNGS: clear to auscultation bilaterally, normal work of breathing HEART: normal rate and regular rhythm; normal S1 and S2 without S3 or S4; no murmurs, rubs, or clicks ABDOMEN: soft, non-tender, normal bowel sounds MSK: left knee is normal; there is tenderness with palpation of the anterior and anterolateral surface of the joint; there is no tenderness of the medial and lateral joint lines or the posterior aspect of the knee; the femur and  tibia are non-tender; there is a slight effusion of the anterolateral aspect of the joint, just lateral to the patella; there is no erythema or warmth MOTOR: 5/5 dorsiflexion, plantarflexion, leg extension, leg flexion SENSATION: sensation intact in feet and legs SKIN: warm, dry, intact  Filed Vitals:   05/15/12 0927  BP: 142/70  Pulse: 78  Temp: 97.1 F (36.2 C)         Assessment & Plan:

## 2012-05-15 NOTE — Patient Instructions (Addendum)
Take the prescription to a pharmacy to receive your Zoster vaccine.  Follow up with Sports Medicine.   WEIGHT REDUCTION:  Strategies: A healthy weight loss program includes:  A calorie restricted diet based on individual calorie needs.   Increased physical activity (exercise).  An exercise program is just as important as the right low-calorie diet.    An unhealthy weight loss program includes:  Fasting.   Fad diets.   Supplements and drugs.  These choices do not succeed in long-term weight control.   Home Care Instructions: To help you make the needed dietary changes:   Exercise and perform physical activity as directed by your caregiver.   Keep a daily record of everything you eat. There are many free websites to help you with this. It may be helpful to measure your foods so you can determine if you are eating the correct portion sizes.   Use low-calorie cookbooks or take special cooking classes.   Avoid alcohol. Drink more water and drinks with no calories.   Take vitamins and supplements only as recommended by your caregiver.   Weight loss support groups, Registered Dieticians, counselors, and stress reduction education can also be very helpful.   ________________________________________________________________________  DASH DIET:  The DASH diet stands for "Dietary Approaches to Stop Hypertension." It is a healthy eating plan that has been shown to reduce high blood pressure (hypertension) in as little as 14 days, while also possibly providing other significant health benefits. These other health benefits include reducing the risk of breast cancer after menopause and reducing the risk of type 2 diabetes, heart disease, colon cancer, and stroke. Health benefits also include weight loss and slowing kidney failure in patients with chronic kidney disease.   Diet guidelines: Limit salt (sodium). Your diet should contain less than 1500 mg of sodium daily.  Limit refined or  processed carbohydrates. Your diet should include mostly whole grains. Desserts and added sugars should be used sparingly.  Include small amounts of heart-healthy fats. These types of fats include nuts, oils, and tub margarine. Limit saturated and trans fats. These fats have been shown to be harmful in the body.   Choosing Foods: The following food groups are based on a 2000 calorie diet. See your Registered Dietitian for individual calorie needs.  Grains and Grain Products (6 to 8 servings daily)  Eat More Often: Whole-wheat bread, brown rice, whole-grain or wheat pasta, quinoa, popcorn without added fat or salt (air popped).  Eat Less Often: White bread, white pasta, white rice, cornbread.  Vegetables (4 to 5 servings daily)  Eat More Often: Fresh, frozen, and canned vegetables. Vegetables may be raw, steamed, roasted, or grilled with a minimal amount of fat.  Eat Less Often/Avoid: Creamed or fried vegetables. Vegetables in a cheese sauce.  Fruit (4 to 5 servings daily)  Eat More Often: All fresh, canned (in natural juice), or frozen fruits. Dried fruits without added sugar. One hundred percent fruit juice ( cup [237 mL] daily).  Eat Less Often: Dried fruits with added sugar. Canned fruit in light or heavy syrup.  [employer], Fish, and Poultry (2 servings or less daily. One serving is 3 to 4 oz [85-114 g]).  Eat More Often: Ninety percent or leaner ground beef, tenderloin, sirloin. Round cuts of beef, chicken breast, [place] breast. All fish. Grill, bake, or broil your meat. Nothing should be fried.  Eat Less Often/Avoid: Fatty cuts of meat, [place], or chicken leg, thigh, or wing. Fried cuts of meat or fish.  Dairy (2 to 3 servings)  Eat More Often: Low-fat or fat-free milk, low-fat plain or light yogurt, reduced-fat or part-skim cheese.  Eat Less Often/Avoid: Milk (whole, 2%, skim, or chocolate). Whole milk yogurt. Full-fat cheeses.  Nuts, Seeds, and Legumes (4 to 5 servings per week)  Eat  More Often: All without added salt.  Eat Less Often/Avoid: Salted nuts and seeds, canned beans with added salt.  Fats and Sweets (limited)  Eat More Often: Vegetable oils, tub margarines without trans fats, sugar-free gelatin. Mayonnaise and salad dressings.  Eat Less Often/Avoid: Coconut oils, palm oils, butter, stick margarine, cream, half and half, cookies, candy, pie.   ________________________________________________________________________  Smoking Cessation Tips 1-800-QUIT-NOW  This document explains the best ways for you to quit smoking and new treatments to help. It lists new medicines that can double or triple your chances of quitting and quitting for good. It also considers ways to avoid relapses and concerns you may have about quitting, including weight gain.   Nicotine: A Powerful Addiction If you have tried to quit smoking, you know how hard it can be. It is hard because nicotine is a very addictive drug. For some people, it can be as addictive as heroin or cocaine. Usually, people make 2 or 3 tries, or more, before finally being able to quit. Each time you try to quit, you can learn about what helps and what hurts. Quitting takes hard work and a lot of effort, but you can quit smoking.   Quitting smoking is one of the most important things you will ever do You will live longer, feel better, and live better.  The impact on your body of quitting smoking is felt almost immediately:   Five keys to quitting: Studies have shown that these 5 steps will help you quit smoking and quit for good. You have the best chances of quitting if you use them together:   1. GET READY  Set a quit date.  Change your environment.  Get rid of ALL cigarettes, ashtrays, matches, and lighters in your home, car, and place of work.  Do not let people smoke in your home.  Review your past attempts to quit. Think about what worked and what did not.  Once you quit, do not smoke. NOT EVEN A PUFF!   2.  GET SUPPORT AND ENCOURAGEMENT  Tell your family, friends, and coworkers that you are going to quit and need their support. Ask them not to smoke around you.  Get individual, group, or telephone counseling and support.  Many smokers find one or more of the many self-help books available useful in helping them quit and stay off tobacco.   3. LEARN NEW SKILLS AND BEHAVIORS  Try to distract yourself from urges to smoke. Talk to someone, go for a walk, or occupy your time with a task.  When you first try to quit, change your routine. Take a different route to work. Drink tea instead of coffee. Eat breakfast in a different place.  Do something to reduce your stress. Take a hot bath, exercise, or read a book.  Plan something enjoyable to do every day. Reward yourself for not smoking.  Explore interactive web-based programs that specialize in helping you quit.   4. GET MEDICINE AND USE IT CORRECTLY .  Medicines can help you stop smoking and decrease the urge to smoke. Combining medicine with the above behavioral methods and support can quadruple your chances of successfully quitting smoking.  Talk with your doctor about  these options.  5. BE PREPARED FOR RELAPSE OR DIFFICULT SITUATIONS  Most relapses occur within the first 3 months after quitting. Do not be discouraged if you start smoking again. Remember, most people try several times before they finally quit.  You may have symptoms of withdrawal because your body is used to nicotine. You may crave cigarettes, be irritable, feel very hungry, cough often, get headaches, or have difficulty concentrating.  The withdrawal symptoms are only temporary. They are strongest when you first quit, but they will go away within 10 to 14 days.   Quitting takes hard work and a lot of effort, but you can quit smoking.   FOR MORE INFORMATION  Smokefree.gov (Inrails.tn) provides free, accurate, evidence-based information and professional assistance to  help support the immediate and long-term needs of people trying to quit smoking.  Document Released: 06/18/2001 Document Re-Released: 12/12/2009  [facility] Patient Information 2011 [place].

## 2012-05-15 NOTE — Assessment & Plan Note (Signed)
Blood pressure has been hard to control.  Slightly hypertensive today.  Clonidine was increased from 0.1mg  to 0.2mg  BID at the last visit.  The patient believes she is taking 0.1mg  BID because her nephrologist supposedly told her it was too strong.  There is no mention of this in Dr. Etheleen Nicks note from September 27th.  I called her pharmacy and confirmed that the patient is prescribed clonidine 0.2mg  BID and she filled this prescription on September 16 and again on October 28.  The last time she filled 0.1mg  was in August.  At her last visit with nephrology, Dr. Mercy Moore started furosemide 40mg  daily, which the patient has been taking.  She follows up with him on November 19.  We will not make changes to this regimen today. - continue amlodipine 10mg  daily - continue clonidine 0.2mg  BID - continue furosemide 40mg  daily

## 2012-05-15 NOTE — Assessment & Plan Note (Signed)
She is currently due for a colonoscopy, but she is addemant that someone told her she does not need one until she is 72 and she will not have one done before then.  She turns 72 February 2012.  She is also due for Zostavax, which was previously postponed for financial reasons.  She is now in a position to afford this.  Rx given for Zostavax to be received at an outside pharmacy - Zostavax rx provided

## 2012-05-15 NOTE — Assessment & Plan Note (Signed)
Most likely diagnosis is osteoarthritis or a chronic bursitis of the right knee.  There are no findings to suggest an inflammatory arthritis.  We are unable to use non-steroidal anti-inflammatory drugs with there renal disease.  I assured the patient that acetaminophen is safe and that she can take up to 3,000mg  a day safely.  We are unable to perform joint injections in our clinic today.  I will refer  her to sports medicine. - tylenol as needed for pain - referral to sports medicine

## 2012-05-27 ENCOUNTER — Ambulatory Visit: Payer: PRIVATE HEALTH INSURANCE | Admitting: Family Medicine

## 2012-06-09 NOTE — Addendum Note (Signed)
Addended by: Hulan Fray on: 06/09/2012 06:42 PM   Modules accepted: Orders

## 2012-06-10 ENCOUNTER — Encounter (HOSPITAL_COMMUNITY): Payer: PRIVATE HEALTH INSURANCE

## 2012-06-26 ENCOUNTER — Encounter (HOSPITAL_COMMUNITY)
Admission: RE | Admit: 2012-06-26 | Discharge: 2012-06-26 | Disposition: A | Discharge status: Home / Self Care | Payer: PRIVATE HEALTH INSURANCE | Source: Ambulatory Visit | Attending: Nephrology | Admitting: Nephrology

## 2012-07-03 ENCOUNTER — Ambulatory Visit (INDEPENDENT_AMBULATORY_CARE_PROVIDER_SITE_OTHER): Payer: PRIVATE HEALTH INSURANCE | Admitting: Sports Medicine

## 2012-07-03 VITALS — BP 134/62 | Ht 63.0 in | Wt 190.0 lb

## 2012-07-03 DIAGNOSIS — IMO0002 Reserved for concepts with insufficient information to code with codable children: Secondary | ICD-10-CM

## 2012-07-03 DIAGNOSIS — M1710 Unilateral primary osteoarthritis, unspecified knee: Secondary | ICD-10-CM

## 2012-07-03 DIAGNOSIS — M25569 Pain in unspecified knee: Secondary | ICD-10-CM

## 2012-07-03 DIAGNOSIS — M1711 Unilateral primary osteoarthritis, right knee: Secondary | ICD-10-CM

## 2012-07-03 NOTE — Progress Notes (Addendum)
  Subjective:    Patient ID: Nancy [name], female    DOB: August 11, 1940, 71 y.o.   MRN: [id number]  HPI chief complaint: Right knee pain  Patient comes in today complaining of 6 months of diffuse right knee pain. She's had similar pain in the past. X-rays in 2008 showed some mild degenerative changes in this knee. She has received intermittent cortisone injections with good symptom relief. She is requesting a repeat injection today. No recent trauma. She has noticed some swelling and stiffness. No groin pain. Pain improves at rest.  Past medical history is reviewed as are her current medications    Review of Systems     Objective:   Physical Exam Well-developed, well-nourished. No acute distress  Right knee: Range of motion 0-120. 1+ boggy synovitis. Diffuse tenderness to palpation along both medial and lateral joint lines. Negative McMurray's. Knee is grossly stable to ligamentous exam. Neurovascularly intact distally. Walking with a slight limp.       Assessment & Plan:  1. Right knee pain and swelling secondary to osteoarthritis  Patient's right knee is injected with a cortisone injection. An anterior lateral approach is utilized. If patient gets long-lasting relief then we can consider repeating this injection down the road. If her pain persists I would start with repeating plain x-rays of her right knee before deciding on further treatment. Followup when necessary.  Consent obtained and verified. Time-out conducted. Noted no overlying erythema, induration, or other signs of local infection. Skin prepped in a sterile fashion. Topical analgesic spray: Ethyl chloride. Joint: right knee Needle: 22g 1 1/2 inch Completed without difficulty. Meds: 1cc depomedrol, 3cc 1% xylocaine  Advised to call if fevers/chills, erythema, induration, drainage, or persistent bleeding.

## 2012-07-12 ENCOUNTER — Emergency Department (HOSPITAL_COMMUNITY): Payer: PRIVATE HEALTH INSURANCE

## 2012-07-12 ENCOUNTER — Encounter (HOSPITAL_COMMUNITY): Payer: Self-pay | Admitting: Adult Health

## 2012-07-12 ENCOUNTER — Encounter (HOSPITAL_COMMUNITY): Payer: Self-pay | Admitting: Emergency Medicine

## 2012-07-12 ENCOUNTER — Emergency Department (HOSPITAL_COMMUNITY)
Admission: EM | Admit: 2012-07-12 | Discharge: 2012-07-12 | Disposition: A | Discharge status: Home / Self Care | Payer: PRIVATE HEALTH INSURANCE | Attending: Emergency Medicine | Admitting: Emergency Medicine

## 2012-07-12 ENCOUNTER — Inpatient Hospital Stay (HOSPITAL_COMMUNITY)
Admission: EM | Admit: 2012-07-12 | Discharge: 2012-07-14 | DRG: 392 | Disposition: A | Discharge status: Home / Self Care | Payer: PRIVATE HEALTH INSURANCE | Attending: Internal Medicine | Admitting: Internal Medicine

## 2012-07-12 DIAGNOSIS — I509 Heart failure, unspecified: Secondary | ICD-10-CM | POA: Insufficient documentation

## 2012-07-12 DIAGNOSIS — E785 Hyperlipidemia, unspecified: Secondary | ICD-10-CM | POA: Diagnosis present

## 2012-07-12 DIAGNOSIS — R112 Nausea with vomiting, unspecified: Secondary | ICD-10-CM | POA: Insufficient documentation

## 2012-07-12 DIAGNOSIS — Z8709 Personal history of other diseases of the respiratory system: Secondary | ICD-10-CM | POA: Insufficient documentation

## 2012-07-12 DIAGNOSIS — Z8719 Personal history of other diseases of the digestive system: Secondary | ICD-10-CM | POA: Insufficient documentation

## 2012-07-12 DIAGNOSIS — I129 Hypertensive chronic kidney disease with stage 1 through stage 4 chronic kidney disease, or unspecified chronic kidney disease: Secondary | ICD-10-CM | POA: Insufficient documentation

## 2012-07-12 DIAGNOSIS — A088 Other specified intestinal infections: Principal | ICD-10-CM | POA: Diagnosis present

## 2012-07-12 DIAGNOSIS — Z8619 Personal history of other infectious and parasitic diseases: Secondary | ICD-10-CM | POA: Insufficient documentation

## 2012-07-12 DIAGNOSIS — Z8742 Personal history of other diseases of the female genital tract: Secondary | ICD-10-CM | POA: Insufficient documentation

## 2012-07-12 DIAGNOSIS — Z8669 Personal history of other diseases of the nervous system and sense organs: Secondary | ICD-10-CM | POA: Insufficient documentation

## 2012-07-12 DIAGNOSIS — Z8739 Personal history of other diseases of the musculoskeletal system and connective tissue: Secondary | ICD-10-CM | POA: Insufficient documentation

## 2012-07-12 DIAGNOSIS — F1410 Cocaine abuse, uncomplicated: Secondary | ICD-10-CM | POA: Diagnosis present

## 2012-07-12 DIAGNOSIS — N184 Chronic kidney disease, stage 4 (severe): Secondary | ICD-10-CM | POA: Insufficient documentation

## 2012-07-12 DIAGNOSIS — R109 Unspecified abdominal pain: Secondary | ICD-10-CM

## 2012-07-12 DIAGNOSIS — Z79899 Other long term (current) drug therapy: Secondary | ICD-10-CM | POA: Insufficient documentation

## 2012-07-12 DIAGNOSIS — Z8673 Personal history of transient ischemic attack (TIA), and cerebral infarction without residual deficits: Secondary | ICD-10-CM

## 2012-07-12 DIAGNOSIS — M1710 Unilateral primary osteoarthritis, unspecified knee: Secondary | ICD-10-CM | POA: Diagnosis present

## 2012-07-12 DIAGNOSIS — F1310 Sedative, hypnotic or anxiolytic abuse, uncomplicated: Secondary | ICD-10-CM | POA: Diagnosis present

## 2012-07-12 DIAGNOSIS — K529 Noninfective gastroenteritis and colitis, unspecified: Secondary | ICD-10-CM | POA: Diagnosis present

## 2012-07-12 DIAGNOSIS — K219 Gastro-esophageal reflux disease without esophagitis: Secondary | ICD-10-CM | POA: Diagnosis present

## 2012-07-12 DIAGNOSIS — R1013 Epigastric pain: Secondary | ICD-10-CM | POA: Insufficient documentation

## 2012-07-12 DIAGNOSIS — Z8249 Family history of ischemic heart disease and other diseases of the circulatory system: Secondary | ICD-10-CM

## 2012-07-12 DIAGNOSIS — Z90710 Acquired absence of both cervix and uterus: Secondary | ICD-10-CM | POA: Insufficient documentation

## 2012-07-12 DIAGNOSIS — R1110 Vomiting, unspecified: Secondary | ICD-10-CM

## 2012-07-12 DIAGNOSIS — N289 Disorder of kidney and ureter, unspecified: Secondary | ICD-10-CM

## 2012-07-12 DIAGNOSIS — R079 Chest pain, unspecified: Secondary | ICD-10-CM | POA: Diagnosis present

## 2012-07-12 DIAGNOSIS — A084 Viral intestinal infection, unspecified: Secondary | ICD-10-CM

## 2012-07-12 DIAGNOSIS — I10 Essential (primary) hypertension: Secondary | ICD-10-CM

## 2012-07-12 LAB — URINALYSIS, ROUTINE W REFLEX MICROSCOPIC
Glucose, UA: NEGATIVE mg/dL
Ketones, ur: NEGATIVE mg/dL
Leukocytes, UA: NEGATIVE
Nitrite: NEGATIVE
Protein, ur: >300 mg/dL — AB

## 2012-07-12 LAB — CBC WITH DIFFERENTIAL/PLATELET
HCT: 36.1 % (ref 36.0–46.0)
Hemoglobin: 11.7 g/dL — ABNORMAL LOW (ref 12.0–15.0)
Lymphocytes Relative: 6 % — ABNORMAL LOW (ref 12–46)
MCHC: 32.4 g/dL (ref 30.0–36.0)
Monocytes Absolute: 0.2 10*3/uL (ref 0.1–1.0)
Monocytes Relative: 2 % — ABNORMAL LOW (ref 3–12)
Neutro Abs: 11.1 10*3/uL — ABNORMAL HIGH (ref 1.7–7.7)
WBC: 12 10*3/uL — ABNORMAL HIGH (ref 4.0–10.5)

## 2012-07-12 LAB — COMPREHENSIVE METABOLIC PANEL
ALT: 11 U/L (ref 0–35)
AST: 20 U/L (ref 0–37)
Alkaline Phosphatase: 67 U/L (ref 39–117)
BUN: 35 mg/dL — ABNORMAL HIGH (ref 6–23)
CO2: 21 mEq/L (ref 19–32)
CO2: 22 mEq/L (ref 19–32)
Chloride: 101 mEq/L (ref 96–112)
Chloride: 103 mEq/L (ref 96–112)
Creatinine, Ser: 3.54 mg/dL — ABNORMAL HIGH (ref 0.50–1.10)
Creatinine, Ser: 3.45 mg/dL — ABNORMAL HIGH (ref 0.50–1.10)
GFR calc non Af Amer: 12 mL/min — ABNORMAL LOW (ref >90)
GFR calc non Af Amer: 13 mL/min — ABNORMAL LOW (ref >90)
Glucose, Bld: 150 mg/dL — ABNORMAL HIGH (ref 70–99)
Potassium: 4.1 mEq/L (ref 3.5–5.1)
Total Bilirubin: 0.2 mg/dL — ABNORMAL LOW (ref 0.3–1.2)
Total Bilirubin: 0.3 mg/dL (ref 0.3–1.2)

## 2012-07-12 LAB — CBC
MCV: 90.3 fL (ref 78.0–100.0)
Platelets: 242 10*3/uL (ref 150–400)
RBC: 3.93 MIL/uL (ref 3.87–5.11)
WBC: 10.2 10*3/uL (ref 4.0–10.5)

## 2012-07-12 LAB — TROPONIN I: Troponin I: <0.3 ng/mL (ref <0.30)

## 2012-07-12 LAB — POCT I-STAT TROPONIN I

## 2012-07-12 LAB — LIPASE, BLOOD: Lipase: 38 U/L (ref 11–59)

## 2012-07-12 LAB — URINE MICROSCOPIC-ADD ON

## 2012-07-12 MED ORDER — SODIUM CHLORIDE 0.9 % IV SOLN
Freq: Once | INTRAVENOUS | Status: AC
  Administered 2012-07-12: 21:00:00 via INTRAVENOUS

## 2012-07-12 MED ORDER — ONDANSETRON 4 MG PO TBDP: ORAL_TABLET | ORAL | Status: DC

## 2012-07-12 MED ORDER — MORPHINE SULFATE 4 MG/ML IJ SOLN
4.0000 mg | Freq: Once | INTRAMUSCULAR | Status: AC
  Administered 2012-07-12: 4 mg via INTRAVENOUS
  Filled 2012-07-12: qty 1

## 2012-07-12 MED ORDER — GI COCKTAIL ~~LOC~~
30.0000 mL | Freq: Once | ORAL | Status: AC
  Administered 2012-07-12: 30 mL via ORAL
  Filled 2012-07-12: qty 30

## 2012-07-12 MED ORDER — HYDROMORPHONE HCL PF 1 MG/ML IJ SOLN
1.0000 mg | Freq: Once | INTRAMUSCULAR | Status: AC
  Administered 2012-07-12: 1 mg via INTRAVENOUS
  Filled 2012-07-12: qty 1

## 2012-07-12 MED ORDER — ONDANSETRON HCL 4 MG/2ML IJ SOLN
4.0000 mg | Freq: Once | INTRAMUSCULAR | Status: AC
  Administered 2012-07-12: 4 mg via INTRAVENOUS
  Filled 2012-07-12: qty 2

## 2012-07-12 MED ORDER — ONDANSETRON HCL 4 MG/2ML IJ SOLN
INTRAMUSCULAR | Status: AC
  Administered 2012-07-12: 4 mg via INTRAVENOUS
  Filled 2012-07-12: qty 2

## 2012-07-12 MED ORDER — SODIUM CHLORIDE 0.9 % IV BOLUS (SEPSIS): 1000.0000 mL | Freq: Once | INTRAVENOUS | Status: DC

## 2012-07-12 MED ORDER — FAMOTIDINE IN NACL 20-0.9 MG/50ML-% IV SOLN
20.0000 mg | Freq: Once | INTRAVENOUS | Status: AC
  Administered 2012-07-12: 20 mg via INTRAVENOUS
  Filled 2012-07-12: qty 50

## 2012-07-12 MED ORDER — PANTOPRAZOLE SODIUM 40 MG IV SOLR
40.0000 mg | Freq: Once | INTRAVENOUS | Status: AC
  Administered 2012-07-12: 40 mg via INTRAVENOUS
  Filled 2012-07-12: qty 40

## 2012-07-12 MED ORDER — SODIUM CHLORIDE 0.9 % IV BOLUS (SEPSIS)
1000.0000 mL | Freq: Once | INTRAVENOUS | Status: AC
  Administered 2012-07-12: 1000 mL via INTRAVENOUS

## 2012-07-12 MED ORDER — PROMETHAZINE HCL 25 MG/ML IJ SOLN
25.0000 mg | Freq: Once | INTRAMUSCULAR | Status: AC
  Administered 2012-07-12: 25 mg via INTRAVENOUS
  Filled 2012-07-12 (×2): qty 1

## 2012-07-12 NOTE — ED Provider Notes (Addendum)
History     CSN: [id number]  Arrival date & time 07/12/12  0310   First MD Initiated Contact with Patient 07/12/12 0315      Chief Complaint  Patient presents with  . Nausea  . Abdominal Pain    (Consider location/radiation/quality/duration/timing/severity/associated sxs/prior treatment) The history is provided by the patient.  Nancy [name] is a 72 y.o. female hx of CVA, CHF, CKD, hysterectomy here with abdominal pain and nausea and vomiting. She ate at [employer] around 4 PM and had some meat loaf. Soon afterwards she started vomiting. She had 4 episodes of vomiting of food and nonbilious nonbloody. She also had diffuse abdominal pain worse in the epigastric area. No fevers or chills or diarrhea. No chest pain or shortness of breath. She had a inguinal hernia repair and hysterectomy and cholecystectomy in the past.   Past Medical History  Diagnosis Date  . CVA (cerebrovascular accident)     New hemorrhagic per CT scan '09  . Fecal impaction   . Diverticulosis of colon   . Hypertension   . Pulmonary nodule   . Dysfunctional uterine bleeding   . Postmenopausal   . OA (osteoarthritis)     bilateral knees  . Colitis   . Bacterial sinusitis 09/17/2011  . TINEA CRURIS 01/12/2007  . HERNIORRHAPHY, HX OF 08/11/2006  . CKD (chronic kidney disease) stage 4, GFR 15-29 ml/min 08/11/2006    Cr continues to increase. Proteinuria on UA 02/10/12.      Past Surgical History  Procedure Date  . Inguinal hernia repair 2008  . Abdominal hysterectomy   . Cholecystectomy 2009  . Iridotomy / iridectomy     Laser, right eye 12/26/11 left eye 01/24/12    Family History  Problem Relation Age of Onset  . Hypertension Mother   . Heart attack Mother   . Heart disease Mother     History  Substance Use Topics  . Smoking status: Never Smoker   . Smokeless tobacco: Not on file  . Alcohol Use: No    OB History    Grav Para Term Preterm Abortions TAB SAB Ect Mult Living                   Review of Systems  Gastrointestinal: Positive for nausea, vomiting and abdominal pain.  All other systems reviewed and are negative.    Allergies  Hydrocodone-acetaminophen  Home Medications   Current Outpatient Rx  Name  Route  Sig  Dispense  Refill  . AMLODIPINE BESYLATE 10 MG PO TABS   Oral   Take 1 tablet (10 mg total) by mouth daily.   30 tablet   6   . CLONIDINE HCL 0.2 MG PO TABS   Oral   Take 1 tablet (0.2 mg total) by mouth 2 (two) times daily.   60 tablet   11   . FAMOTIDINE 40 MG PO TABS      take 1 tablet by mouth once daily   30 tablet   11   . FUROSEMIDE 40 MG PO TABS   Oral   Take 40 mg by mouth daily.         . TRAVOPROST (BAK FREE) 0.004 % OP SOLN   Both Eyes   Place 1 drop into both eyes at bedtime.         Marland Kitchen ONDANSETRON 4 MG PO TBDP      4mg  ODT q4 hours prn nausea/vomit   8 tablet   0  BP 160/75  Pulse 110  Temp 98.4 F (36.9 C) (Oral)  Resp 16  SpO2 99%  Physical Exam  Nursing note and vitals reviewed. Constitutional: She is oriented to person, place, and time.       Uncomfortable, vomiting   HENT:  Head: Normocephalic.       MM dry   Eyes: Conjunctivae normal are normal. Pupils are equal, round, and reactive to light.  Neck: Normal range of motion. Neck supple.  Cardiovascular: Regular rhythm and normal heart sounds.        Tachy   Pulmonary/Chest: Effort normal and breath sounds normal. No respiratory distress. She has no wheezes. She has no rales.  Abdominal: Soft.       + epigastric tenderness, no rebound. No distension.   Musculoskeletal: Normal range of motion. She exhibits no edema.  Neurological: She is alert and oriented to person, place, and time.  Skin: Skin is warm and dry.  Psychiatric: She has a normal mood and affect. Her behavior is normal. Judgment and thought content normal.    ED Course  Procedures (including critical care time)  Labs Reviewed  CBC WITH DIFFERENTIAL - Abnormal; Notable  for the following:    WBC 12.0 (*)     Hemoglobin 11.7 (*)     Neutrophils Relative 92 (*)     Neutro Abs 11.1 (*)     Lymphocytes Relative 6 (*)     Monocytes Relative 2 (*)     All other components within normal limits  COMPREHENSIVE METABOLIC PANEL - Abnormal; Notable for the following:    Glucose, Bld 150 (*)     BUN 35 (*)     Creatinine, Ser 3.54 (*)     Total Protein 8.4 (*)     Total Bilirubin 0.2 (*)     GFR calc non Af Amer 12 (*)     GFR calc Af Amer 14 (*)     All other components within normal limits  URINALYSIS, ROUTINE W REFLEX MICROSCOPIC - Abnormal; Notable for the following:    Hgb urine dipstick SMALL (*)     Protein, ur >300 (*)     All other components within normal limits  LIPASE, BLOOD  TROPONIN I  URINE MICROSCOPIC-ADD ON  TROPONIN I   Dg Abd Acute W/chest  07/12/2012  *RADIOLOGY REPORT*  Clinical Data: Nausea, vomiting, right lower abdominal pain, history coronary disease, hypertension, chronic kidney disease  ACUTE ABDOMEN SERIES (ABDOMEN 2 VIEW & CHEST 1 VIEW)  Comparison: Abdominal radiograph 08/17/2008, chest radiograph 08/15/2008  Findings: Minimal enlargement of cardiac silhouette. Pulmonary vascular congestion. Stable mediastinal contours with atherosclerotic calcification at aortic arch. Bronchitic changes with minimal left basilar atelectasis. Remaining lungs clear. Nonobstructive bowel gas pattern. No bowel dilatation, bowel wall thickening, or free intraperitoneal air. Surgical clips right upper quadrant question cholecystectomy. Pelvic phleboliths with additional questionable calcification versus retained contrast material in the inferior pelvis. No urinary tract calcification.  IMPRESSION: Minimal enlargement of cardiac silhouette. Bronchitic changes with minimal left basilar atelectasis. Nonobstructive bowel gas pattern.   Original Report Authenticated By: Lavonia Dana, M.D.      1. Vomiting   2. Abdominal pain      Date: 07/12/2012  Rate: 72   Rhythm: normal sinus rhythm  QRS Axis: normal  Intervals: normal  ST/T Wave abnormalities: ST depressions inferior and lateral, different than 2009  Conduction Disutrbances:none  Narrative Interpretation:   Old EKG Reviewed: changes noted    MDM  Nancy [name]  is a 72 y.o. female hx of HTN, CHF here with vomiting, nausea, ab pain. Likely food poisoning vs gastritis. She is not clinically in heart failure so will give IVF and pain meds and zofran and reassess. Will get xray to r/o partial SBO given multiple surgeries. EKG nonspecific changes that is different from before so will get trop x 2. This is very atypical for ACS.    6:34 AM Pain improved. Able to tolerate PO. Xray nl. Trop neg x 2. Likely gastroenteritis vs food poisoning. Will give prn zofran. Tachycardia improved with IVF and zofran.       Wandra Arthurs, MD 07/12/12 [id number]  Wandra Arthurs, MD 07/12/12 [phone]

## 2012-07-12 NOTE — ED Notes (Addendum)
Presents with 2 days of nausea, vomiting and abdominal pain. Abdominal pain is in bilateral upper quadrants, pain is described as terrible and sharp.  Seen here yesterday and told she had food poisoning. Symptoms have worsened and now having left sided chest pain. Radiates into abdomen. Productive white sputum cough that began yesterday. Pt is tachycardic 120s.  EKG with  ST depression.

## 2012-07-12 NOTE — ED Notes (Signed)
Patient is sleeping post phenergan administration.  Family remains at bedside.  Will continue to monitor and d/c when patient is more alert

## 2012-07-12 NOTE — ED Notes (Signed)
Patient given gingerale for oral challenge and was able to keep drink down, no nausea or pain at this time.

## 2012-07-12 NOTE — ED Notes (Signed)
Patient continues to rest.  ermd aware of patient resting post medication.   Will continue to monitor

## 2012-07-12 NOTE — ED Notes (Signed)
Pt given CT contrast to drink; pt ambulated to restroom;

## 2012-07-12 NOTE — ED Notes (Signed)
Patient became nauseated after GI cocktail.

## 2012-07-12 NOTE — ED Provider Notes (Signed)
History     CSN: [id number]  Arrival date & time 07/12/12  1911   First MD Initiated Contact with Patient 07/12/12 1928      Chief Complaint  Patient presents with  . Chest Pain  . Emesis    (Consider location/radiation/quality/duration/timing/severity/associated sxs/prior treatment) The history is provided by the patient and a relative.   is a 72 year old female comes in because of ongoing problems with nausea, vomiting, abdominal pain. She was doing well until dinnertime last night when she started having upper abdominal pain without radiation. Pain was dull and crampy and she rated it an 8/10. Nothing made it better nothing made it worse. She denies constipation or diarrhea. She denies dyspnea. Denies chest pain, heaviness, tightness, pressure. She did receive injections in the emergency department which gave her some relief and she felt better when she left the emergency department, but she started feeling worse once the medication wore off. She has not had problems like this before appear.  Past Medical History  Diagnosis Date  . CVA (cerebrovascular accident)     New hemorrhagic per CT scan '09  . Fecal impaction   . Diverticulosis of colon   . Hypertension   . Pulmonary nodule   . Dysfunctional uterine bleeding   . Postmenopausal   . OA (osteoarthritis)     bilateral knees  . Colitis   . Bacterial sinusitis 09/17/2011  . TINEA CRURIS 01/12/2007  . HERNIORRHAPHY, HX OF 08/11/2006  . CKD (chronic kidney disease) stage 4, GFR 15-29 ml/min 08/11/2006    Cr continues to increase. Proteinuria on UA 02/10/12.      Past Surgical History  Procedure Date  . Inguinal hernia repair 2008  . Abdominal hysterectomy   . Cholecystectomy 2009  . Iridotomy / iridectomy     Laser, right eye 12/26/11 left eye 01/24/12    Family History  Problem Relation Age of Onset  . Hypertension Mother   . Heart attack Mother   . Heart disease Mother     History  Substance Use Topics  . Smoking  status: Never Smoker   . Smokeless tobacco: Not on file  . Alcohol Use: No    OB History    Grav Para Term Preterm Abortions TAB SAB Ect Mult Living                  Review of Systems  All other systems reviewed and are negative.    Allergies  Hydrocodone-acetaminophen  Home Medications   Current Outpatient Rx  Name  Route  Sig  Dispense  Refill  . AMLODIPINE BESYLATE 10 MG PO TABS   Oral   Take 1 tablet (10 mg total) by mouth daily.   30 tablet   6   . CLONIDINE HCL 0.2 MG PO TABS   Oral   Take 1 tablet (0.2 mg total) by mouth 2 (two) times daily.   60 tablet   11   . FAMOTIDINE 40 MG PO TABS      take 1 tablet by mouth once daily   30 tablet   11   . FUROSEMIDE 40 MG PO TABS   Oral   Take 40 mg by mouth daily.         Marland Kitchen ONDANSETRON 4 MG PO TBDP   Oral   Take 4 mg by mouth every 4 (four) hours as needed. For nausea         . TRAVOPROST (BAK FREE) 0.004 % OP SOLN  Both Eyes   Place 1 drop into both eyes at bedtime.           BP 170/54  Pulse 122  Temp 99.1 F (37.3 C) (Oral)  Resp 24  SpO2 96%  Physical Exam  Nursing note and vitals reviewed. 72 year old female, resting comfortably and in no acute distress. Vital signs are significant for hypertension with blood pressure 170/54, tachypnea with respiratory rate of 24, and tachycardia with heart rate of 122. Oxygen saturation is 96%, which is normal. Head is normocephalic and atraumatic. PERRLA, EOMI. Oropharynx is clear. Neck is nontender and supple without adenopathy or JVD. Back is nontender and there is no CVA tenderness. Lungs are clear without rales, wheezes, or rhonchi. Chest is nontender. Heart has regular rate and rhythm without murmur. Abdomen is soft, flat, with moderate epigastric and right upper quadrant tenderness without rebound or guarding. There are no masses or hepatosplenomegaly and peristalsis is hypoactive. Extremities have no cyanosis or edema, full range of motion is  present. Skin is warm and dry without rash. Neurologic: Mental status is normal, cranial nerves are intact, there are no motor or sensory deficits.   ED Course  Procedures (including critical care time)  Results for orders placed during the hospital encounter of 07/12/12  CBC      Component Value Range   WBC 10.2  4.0 - 10.5 K/uL   RBC 3.93  3.87 - 5.11 MIL/uL   Hemoglobin 11.3 (*) 12.0 - 15.0 g/dL   HCT 35.5 (*) 36.0 - 46.0 %   MCV 90.3  78.0 - 100.0 fL   MCH 28.8  26.0 - 34.0 pg   MCHC 31.8  30.0 - 36.0 g/dL   RDW 13.9  11.5 - 15.5 %   Platelets 242  150 - 400 K/uL  COMPREHENSIVE METABOLIC PANEL      Component Value Range   Sodium 139  135 - 145 mEq/L   Potassium 4.1  3.5 - 5.1 mEq/L   Chloride 103  96 - 112 mEq/L   CO2 22  19 - 32 mEq/L   Glucose, Bld 139 (*) 70 - 99 mg/dL   BUN 32 (*) 6 - 23 mg/dL   Creatinine, Ser 3.45 (*) 0.50 - 1.10 mg/dL   Calcium 9.5  8.4 - 10.5 mg/dL   Total Protein 7.9  6.0 - 8.3 g/dL   Albumin 3.8  3.5 - 5.2 g/dL   AST 20  0 - 37 U/L   ALT 11  0 - 35 U/L   Alkaline Phosphatase 67  39 - 117 U/L   Total Bilirubin 0.3  0.3 - 1.2 mg/dL   GFR calc non Af Amer 13 (*) >90 mL/min   GFR calc Af Amer 15 (*) >90 mL/min  LIPASE, BLOOD      Component Value Range   Lipase 28  11 - 59 U/L  CG4 I-STAT (LACTIC ACID)      Component Value Range   Lactic Acid, Venous 0.96  0.5 - 2.2 mmol/L  POCT I-STAT TROPONIN I      Component Value Range   Troponin i, poc 0.02  0.00 - 0.08 ng/mL   Comment 3            Dg Chest Portable 1 View  07/12/2012  *RADIOLOGY REPORT*  Clinical Data: Chest pain with emesis.  PORTABLE CHEST - 1 VIEW  Comparison: 07/12/2012  Findings: Mildly enlarged cardiac silhouette. Mild bronchitic change persists. Minimal left base atelectasis.  No  effusion or pneumothorax.  Atherosclerosis of the transverse arch.  Negative osseous structures.  IMPRESSION: Stable bronchitic change.   Original Report Authenticated By: Rolla Flatten, M.D.    Dg Abd  Acute W/chest  07/12/2012  *RADIOLOGY REPORT*  Clinical Data: Nausea, vomiting, right lower abdominal pain, history coronary disease, hypertension, chronic kidney disease  ACUTE ABDOMEN SERIES (ABDOMEN 2 VIEW & CHEST 1 VIEW)  Comparison: Abdominal radiograph 08/17/2008, chest radiograph 08/15/2008  Findings: Minimal enlargement of cardiac silhouette. Pulmonary vascular congestion. Stable mediastinal contours with atherosclerotic calcification at aortic arch. Bronchitic changes with minimal left basilar atelectasis. Remaining lungs clear. Nonobstructive bowel gas pattern. No bowel dilatation, bowel wall thickening, or free intraperitoneal air. Surgical clips right upper quadrant question cholecystectomy. Pelvic phleboliths with additional questionable calcification versus retained contrast material in the inferior pelvis. No urinary tract calcification.  IMPRESSION: Minimal enlargement of cardiac silhouette. Bronchitic changes with minimal left basilar atelectasis. Nonobstructive bowel gas pattern.   Original Report Authenticated By: Lavonia Dana, M.D.        Date: 07/12/2012  Rate: 118  Rhythm: sinus tachycardia  QRS Axis: normal  Intervals: normal  ST/T Wave abnormalities: Ischemic appearing ST depression in inferior and anterolateral leads  Conduction Disutrbances:none  Narrative Interpretation: Sinus tachycardia with flat ST depression in inferior and anterolateral leads worrisome for ischemia. When compared with ECG of earlier today, no significant changes are seen. When compared with ECG of every 26 2008, ST and T changes appear more likely to be due to ischemia not on earlier tracing.  Old EKG Reviewed: changes noted    1. Abdominal pain   2. Nausea & vomiting   3. Renal insufficiency       MDM  Abdominal pain and vomiting of uncertain cause. Her prior records were reviewed, and she was seen early this morning with multiple lab tests coming back unremarkable including troponin x2.  However, ECG tonight is worrisome for ischemia in the inferior leads and anterolateral leads, and it is possible that her abdominal pain is mesenteric angina.  Workup thus far is unremarkable. She has renal insufficiency which is unchanged from baseline. Lactate is normal and repeat troponin is normal. However, she continues to have tachycardia in spite of IV fluid bolus. At this time, CT scan is pending in case is signed out to [place].   Delora Fuel, MD [id number]

## 2012-07-12 NOTE — ED Notes (Signed)
Patient transported to X-ray 

## 2012-07-12 NOTE — ED Notes (Signed)
Patient with abdominal pain and nausea since going out to eat at the [facility] last evening.  Patient has been vomitting during the night, x3.  Pain is generalized.

## 2012-07-13 ENCOUNTER — Emergency Department (HOSPITAL_COMMUNITY): Payer: PRIVATE HEALTH INSURANCE

## 2012-07-13 ENCOUNTER — Encounter (HOSPITAL_COMMUNITY): Payer: Self-pay | Admitting: Radiology

## 2012-07-13 DIAGNOSIS — R109 Unspecified abdominal pain: Secondary | ICD-10-CM

## 2012-07-13 DIAGNOSIS — E785 Hyperlipidemia, unspecified: Secondary | ICD-10-CM

## 2012-07-13 DIAGNOSIS — R079 Chest pain, unspecified: Secondary | ICD-10-CM

## 2012-07-13 DIAGNOSIS — R1013 Epigastric pain: Secondary | ICD-10-CM

## 2012-07-13 DIAGNOSIS — I129 Hypertensive chronic kidney disease with stage 1 through stage 4 chronic kidney disease, or unspecified chronic kidney disease: Secondary | ICD-10-CM

## 2012-07-13 DIAGNOSIS — K219 Gastro-esophageal reflux disease without esophagitis: Secondary | ICD-10-CM

## 2012-07-13 DIAGNOSIS — K529 Noninfective gastroenteritis and colitis, unspecified: Secondary | ICD-10-CM | POA: Diagnosis present

## 2012-07-13 LAB — TSH: TSH: 1.915 u[IU]/mL (ref 0.350–4.500)

## 2012-07-13 LAB — CBC WITH DIFFERENTIAL/PLATELET
Basophils Absolute: 0 10*3/uL (ref 0.0–0.1)
Eosinophils Relative: 0 % (ref 0–5)
Lymphocytes Relative: 8 % — ABNORMAL LOW (ref 12–46)
Lymphs Abs: 0.8 10*3/uL (ref 0.7–4.0)
Neutrophils Relative %: 87 % — ABNORMAL HIGH (ref 43–77)
Platelets: 247 10*3/uL (ref 150–400)
RBC: 3.65 MIL/uL — ABNORMAL LOW (ref 3.87–5.11)
RDW: 14.3 % (ref 11.5–15.5)
WBC: 10.1 10*3/uL (ref 4.0–10.5)

## 2012-07-13 LAB — PROTIME-INR
INR: 1.06 (ref 0.00–1.49)
Prothrombin Time: 13.7 seconds (ref 11.6–15.2)

## 2012-07-13 LAB — APTT: aPTT: 25 seconds (ref 24–37)

## 2012-07-13 LAB — BASIC METABOLIC PANEL
CO2: 23 mEq/L (ref 19–32)
Calcium: 9.2 mg/dL (ref 8.4–10.5)
Chloride: 105 mEq/L (ref 96–112)
Creatinine, Ser: 3.33 mg/dL — ABNORMAL HIGH (ref 0.50–1.10)
GFR calc Af Amer: 15 mL/min — ABNORMAL LOW (ref >90)
Sodium: 137 mEq/L (ref 135–145)

## 2012-07-13 LAB — TROPONIN I: Troponin I: <0.3 ng/mL (ref <0.30)

## 2012-07-13 MED ORDER — ASPIRIN 81 MG PO CHEW
324.0000 mg | CHEWABLE_TABLET | ORAL | Status: AC
  Administered 2012-07-13: 324 mg via ORAL
  Filled 2012-07-13: qty 4
  Filled 2012-07-13: qty 1

## 2012-07-13 MED ORDER — SODIUM CHLORIDE 0.9 % IV SOLN: INTRAVENOUS | Status: AC

## 2012-07-13 MED ORDER — ONDANSETRON HCL 4 MG/2ML IJ SOLN
4.0000 mg | Freq: Once | INTRAMUSCULAR | Status: AC
  Administered 2012-07-13: 4 mg via INTRAVENOUS
  Filled 2012-07-13: qty 2

## 2012-07-13 MED ORDER — ONDANSETRON HCL 4 MG PO TABS
8.0000 mg | ORAL_TABLET | ORAL | Status: DC | PRN
Start: 2012-07-13 — End: 2012-07-13

## 2012-07-13 MED ORDER — ONDANSETRON HCL 4 MG/2ML IJ SOLN
4.0000 mg | Freq: Four times a day (QID) | INTRAMUSCULAR | Status: DC
  Administered 2012-07-13: 4 mg via INTRAVENOUS
  Filled 2012-07-13: qty 2

## 2012-07-13 MED ORDER — ASPIRIN 300 MG RE SUPP: 300.0000 mg | RECTAL | Status: AC

## 2012-07-13 MED ORDER — ALUM & MAG HYDROXIDE-SIMETH 200-200-20 MG/5ML PO SUSP
30.0000 mL | ORAL | Status: DC | PRN
  Administered 2012-07-13: 30 mL via ORAL
  Filled 2012-07-13: qty 30

## 2012-07-13 MED ORDER — ATORVASTATIN CALCIUM 80 MG PO TABS
80.0000 mg | ORAL_TABLET | Freq: Every day | ORAL | Status: DC
  Administered 2012-07-13: 80 mg via ORAL
  Filled 2012-07-13 (×3): qty 1

## 2012-07-13 MED ORDER — NITROGLYCERIN 0.4 MG SL SUBL
0.4000 mg | SUBLINGUAL_TABLET | SUBLINGUAL | Status: AC | PRN
  Administered 2012-07-13 (×3): 0.4 mg via SUBLINGUAL
  Filled 2012-07-13 (×2): qty 25

## 2012-07-13 MED ORDER — MORPHINE SULFATE 2 MG/ML IJ SOLN: 2.0000 mg | INTRAMUSCULAR | Status: DC | PRN

## 2012-07-13 MED ORDER — ONDANSETRON HCL 4 MG PO TABS: 4.0000 mg | ORAL_TABLET | ORAL | Status: DC | PRN

## 2012-07-13 MED ORDER — ACETAMINOPHEN 325 MG PO TABS
975.0000 mg | ORAL_TABLET | Freq: Three times a day (TID) | ORAL | Status: DC | PRN
  Administered 2012-07-13: 975 mg via ORAL
  Filled 2012-07-13: qty 3

## 2012-07-13 MED ORDER — HEPARIN SODIUM (PORCINE) 5000 UNIT/ML IJ SOLN
5000.0000 [IU] | Freq: Three times a day (TID) | INTRAMUSCULAR | Status: DC
  Administered 2012-07-13 – 2012-07-14 (×3): 5000 [IU] via SUBCUTANEOUS
  Filled 2012-07-13 (×7): qty 1

## 2012-07-13 MED ORDER — TRAVOPROST (BAK FREE) 0.004 % OP SOLN
1.0000 [drp] | Freq: Every day | OPHTHALMIC | Status: DC
  Administered 2012-07-13: 1 [drp] via OPHTHALMIC
  Filled 2012-07-13 (×2): qty 2.5

## 2012-07-13 MED ORDER — SODIUM CHLORIDE 0.9 % IV SOLN: 250.0000 mL | INTRAVENOUS | Status: DC | PRN

## 2012-07-13 MED ORDER — FAMOTIDINE 20 MG PO TABS
20.0000 mg | ORAL_TABLET | Freq: Every day | ORAL | Status: DC
  Administered 2012-07-13 – 2012-07-14 (×2): 20 mg via ORAL
  Filled 2012-07-13 (×2): qty 1

## 2012-07-13 MED ORDER — AMLODIPINE BESYLATE 10 MG PO TABS
10.0000 mg | ORAL_TABLET | Freq: Every day | ORAL | Status: DC
  Administered 2012-07-13 – 2012-07-14 (×2): 10 mg via ORAL
  Filled 2012-07-13 (×3): qty 1

## 2012-07-13 MED ORDER — ONDANSETRON HCL 4 MG/2ML IJ SOLN: 4.0000 mg | Freq: Four times a day (QID) | INTRAMUSCULAR | Status: DC | PRN

## 2012-07-13 MED ORDER — METOPROLOL TARTRATE 1 MG/ML IV SOLN
5.0000 mg | INTRAVENOUS | Status: AC
  Administered 2012-07-13: 5 mg via INTRAVENOUS
  Filled 2012-07-13: qty 5

## 2012-07-13 MED ORDER — SODIUM CHLORIDE 0.9 % IJ SOLN: 3.0000 mL | INTRAMUSCULAR | Status: DC | PRN

## 2012-07-13 MED ORDER — CLONIDINE HCL 0.2 MG PO TABS
0.2000 mg | ORAL_TABLET | Freq: Two times a day (BID) | ORAL | Status: DC
  Administered 2012-07-13 – 2012-07-14 (×3): 0.2 mg via ORAL
  Filled 2012-07-13 (×4): qty 1

## 2012-07-13 MED ORDER — SODIUM CHLORIDE 0.9 % IJ SOLN
3.0000 mL | Freq: Two times a day (BID) | INTRAMUSCULAR | Status: DC
  Administered 2012-07-13 (×2): 3 mL via INTRAVENOUS

## 2012-07-13 MED ORDER — NITROGLYCERIN 0.4 MG SL SUBL: 0.4000 mg | SUBLINGUAL_TABLET | SUBLINGUAL | Status: DC | PRN

## 2012-07-13 MED ORDER — CARVEDILOL 3.125 MG PO TABS: 3.1250 mg | ORAL_TABLET | Freq: Two times a day (BID) | ORAL | Status: DC

## 2012-07-13 MED ORDER — ASPIRIN EC 81 MG PO TBEC
81.0000 mg | DELAYED_RELEASE_TABLET | Freq: Every day | ORAL | Status: DC
  Administered 2012-07-14: 81 mg via ORAL
  Filled 2012-07-13: qty 1

## 2012-07-13 NOTE — H&P (Addendum)
Internal Medicine Teaching Service Attending Note Date: 07/13/2012  Patient name: Nancy [name]  Medical record number: [id number]  Date of birth: Nov 29, 1940   I have seen and evaluated Nancy E Mallek and discussed their care with the Residency Team.    Ms. Benz is a 72yo woman with PMH of stage IV CKD, HTN, HLD who presented to the ED complaining of nausea, vomiting and epigastric pain.  All of these symptoms seem to present after eating at [facility], ~ 48 hours before her presentation.  These symptoms worsened overnight and she presented to the ED early on Sunday morning.  She was found to have some EKG changes, negative troponin and was sent home with the diagnosis of food poisoning.  Her symptoms again worsened, and she came to the ED again.  She describes the pain as dull and diffuse, but worse in the epigastrium.  She has a decreased appetite and decreased PO intake.  She denies any dysphagia, odynophagia, heart burn, diarrhea, constipation, fever or chills.  The pain is constant and does not radiate.  It is 10/10 at its worst.  Nothing makes it better or worse, except it did improve somewhat with morphine in th ED.  Further negative ROS includes no dizziness, headache, blurred vision, congestion, viral illness, change in urinary habits.  She does have a mild intermittent cough.   She had an EKG in the ED which was concerning for new ST depressions in the inferolateral leads.  Her first set of enzymes was negative.   For further history, meds, allergies, ROS, please see resident note.   Physical Exam: Blood pressure 160/69, pulse 116, temperature 99.2 F (37.3 C), temperature source Oral, resp. rate 24, height 5\' 3"  (1.6 m), weight 190 lb 4.1 oz (86.3 kg), SpO2 93.00%. General appearance: alert, cooperative and appears stated age Head: Normocephalic, without obvious abnormality, atraumatic Eyes: EOMI, anicteric Lungs: clear to auscultation bilaterally and no wheezing Heart: tachycardic  (0000000), NR, + systolic murmur Abdomen: diffusely tender, worse in the upper quadrants, +BS, non distended Extremities: no edema, no cyanosis Pulses: 2+ and symmetric Skin: no rash, wound Neurologic: Grossly normal  Lab results: Results for orders placed during the hospital encounter of 07/12/12 (from the past 24 hour(s))  CBC     Status: Abnormal   Collection Time   07/12/12  8:00 PM      Component Value Range   WBC 10.2  4.0 - 10.5 K/uL   RBC 3.93  3.87 - 5.11 MIL/uL   Hemoglobin 11.3 (*) 12.0 - 15.0 g/dL   HCT 35.5 (*) 36.0 - 46.0 %   MCV 90.3  78.0 - 100.0 fL   MCH 28.8  26.0 - 34.0 pg   MCHC 31.8  30.0 - 36.0 g/dL   RDW 13.9  11.5 - 15.5 %   Platelets 242  150 - 400 K/uL  POCT I-STAT TROPONIN I     Status: Normal   Collection Time   07/12/12  8:29 PM      Component Value Range   Troponin i, poc 0.02  0.00 - 0.08 ng/mL   Comment 3           CG4 I-STAT (LACTIC ACID)     Status: Normal   Collection Time   07/12/12  8:32 PM      Component Value Range   Lactic Acid, Venous 0.96  0.5 - 2.2 mmol/L  COMPREHENSIVE METABOLIC PANEL     Status: Abnormal   Collection Time  07/12/12  9:20 PM      Component Value Range   Sodium 139  135 - 145 mEq/L   Potassium 4.1  3.5 - 5.1 mEq/L   Chloride 103  96 - 112 mEq/L   CO2 22  19 - 32 mEq/L   Glucose, Bld 139 (*) 70 - 99 mg/dL   BUN 32 (*) 6 - 23 mg/dL   Creatinine, Ser 3.45 (*) 0.50 - 1.10 mg/dL   Calcium 9.5  8.4 - 10.5 mg/dL   Total Protein 7.9  6.0 - 8.3 g/dL   Albumin 3.8  3.5 - 5.2 g/dL   AST 20  0 - 37 U/L   ALT 11  0 - 35 U/L   Alkaline Phosphatase 67  39 - 117 U/L   Total Bilirubin 0.3  0.3 - 1.2 mg/dL   GFR calc non Af Amer 13 (*) >90 mL/min   GFR calc Af Amer 15 (*) >90 mL/min  LIPASE, BLOOD     Status: Normal   Collection Time   07/12/12  9:20 PM      Component Value Range   Lipase 28  11 - 59 U/L  MAGNESIUM     Status: Normal   Collection Time   07/12/12  9:20 PM      Component Value Range   Magnesium 2.1  1.5 - 2.5  mg/dL  TROPONIN I     Status: Normal   Collection Time   07/13/12  6:41 AM      Component Value Range   Troponin I <0.30  <0.30 ng/mL  PROTIME-INR     Status: Normal   Collection Time   07/13/12  6:42 AM      Component Value Range   Prothrombin Time 13.7  11.6 - 15.2 seconds   INR 1.06  0.00 - 1.49  APTT     Status: Normal   Collection Time   07/13/12  6:42 AM      Component Value Range   aPTT 25  24 - 37 seconds  CBC WITH DIFFERENTIAL     Status: Abnormal   Collection Time   07/13/12  6:42 AM      Component Value Range   WBC 10.1  4.0 - 10.5 K/uL   RBC 3.65 (*) 3.87 - 5.11 MIL/uL   Hemoglobin 10.6 (*) 12.0 - 15.0 g/dL   HCT 33.2 (*) 36.0 - 46.0 %   MCV 91.0  78.0 - 100.0 fL   MCH 29.0  26.0 - 34.0 pg   MCHC 31.9  30.0 - 36.0 g/dL   RDW 14.3  11.5 - 15.5 %   Platelets 247  150 - 400 K/uL   Neutrophils Relative 87 (*) 43 - 77 %   Neutro Abs 8.8 (*) 1.7 - 7.7 K/uL   Lymphocytes Relative 8 (*) 12 - 46 %   Lymphs Abs 0.8  0.7 - 4.0 K/uL   Monocytes Relative 5  3 - 12 %   Monocytes Absolute 0.5  0.1 - 1.0 K/uL   Eosinophils Relative 0  0 - 5 %   Eosinophils Absolute 0.0  0.0 - 0.7 K/uL   Basophils Relative 0  0 - 1 %   Basophils Absolute 0.0  0.0 - 0.1 K/uL  BASIC METABOLIC PANEL     Status: Abnormal   Collection Time   07/13/12  6:42 AM      Component Value Range   Sodium 137  135 - 145 mEq/L   Potassium 4.2  3.5 - 5.1 mEq/L   Chloride 105  96 - 112 mEq/L   CO2 23  19 - 32 mEq/L   Glucose, Bld 119 (*) 70 - 99 mg/dL   BUN 30 (*) 6 - 23 mg/dL   Creatinine, Ser 3.33 (*) 0.50 - 1.10 mg/dL   Calcium 9.2  8.4 - 10.5 mg/dL   GFR calc non Af Amer 13 (*) >90 mL/min   GFR calc Af Amer 15 (*) >90 mL/min  MRSA PCR SCREENING     Status: Normal   Collection Time   07/13/12  7:03 AM      Component Value Range   MRSA by PCR NEGATIVE  NEGATIVE    Imaging results:  Ct Abdomen Pelvis Wo Contrast  07/13/2012  *RADIOLOGY REPORT*  Clinical Data: Abdominal pain and nausea for 2 days.  CT  ABDOMEN AND PELVIS WITHOUT CONTRAST  Technique:  Multidetector CT imaging of the abdomen and pelvis was performed following the standard protocol without intravenous contrast.  Comparison: Chest and two views abdomen 07/12/2012.  CT abdomen and pelvis 08/15/2008.  Findings: Dependent atelectatic change is seen in the lung bases. A 0.5 cm right lower lobe pulmonary nodule is unchanged on image number 9.  There is no pleural or pericardial effusion.  The patient is status post cholecystectomy.  The liver, spleen, adrenal glands, pancreas and kidneys are unremarkable.  Fibroid uterus is again seen.  Urinary bladder is unremarkable.  Scattered diverticulosis without diverticulitis is noted.  The colon is otherwise unremarkable.  The appendix, stomach and small bowel appear normal.  No lymphadenopathy or fluid is identified.  There is no focal bony abnormality.  IMPRESSION:  1.  No acute finding. 2.  Unchanged small to moderate pericardial effusion. 3.  Fibroid uterus. 4.  Status post cholecystectomy. 5.  Diverticulosis without diverticulitis.   Original Report Authenticated By: Orlean Patten, M.D.    Dg Chest Portable 1 View  07/12/2012  *RADIOLOGY REPORT*  Clinical Data: Chest pain with emesis.  PORTABLE CHEST - 1 VIEW  Comparison: 07/12/2012  Findings: Mildly enlarged cardiac silhouette. Mild bronchitic change persists. Minimal left base atelectasis.  No effusion or pneumothorax.  Atherosclerosis of the transverse arch.  Negative osseous structures.  IMPRESSION: Stable bronchitic change.   Original Report Authenticated By: Rolla Flatten, M.D.    Dg Abd Acute W/chest  07/12/2012  *RADIOLOGY REPORT*  Clinical Data: Nausea, vomiting, right lower abdominal pain, history coronary disease, hypertension, chronic kidney disease  ACUTE ABDOMEN SERIES (ABDOMEN 2 VIEW & CHEST 1 VIEW)  Comparison: Abdominal radiograph 08/17/2008, chest radiograph 08/15/2008  Findings: Minimal enlargement of cardiac silhouette. Pulmonary  vascular congestion. Stable mediastinal contours with atherosclerotic calcification at aortic arch. Bronchitic changes with minimal left basilar atelectasis. Remaining lungs clear. Nonobstructive bowel gas pattern. No bowel dilatation, bowel wall thickening, or free intraperitoneal air. Surgical clips right upper quadrant question cholecystectomy. Pelvic phleboliths with additional questionable calcification versus retained contrast material in the inferior pelvis. No urinary tract calcification.  IMPRESSION: Minimal enlargement of cardiac silhouette. Bronchitic changes with minimal left basilar atelectasis. Nonobstructive bowel gas pattern.   Original Report Authenticated By: Lavonia Dana, M.D.     Assessment and Plan: I agree with the formulated Assessment and Plan with the following changes:   1. Viral gastroenteritis - This is the likely cause with a normal CT scan of the abdomen and the length of symptoms.  Food poisoning would likely be resolving by now.    - She is still having nausea and inability  to tolerate PO - Clears and continued IVF until she can tolerate PO - Supportive care - WBC 10.2 - Nausea control with scheduled zofran for now.   2. EKG changes, tachycardia - There was concern for ACS - Cardiology consulted - CE cycled and normal - TSH pending - Per daughter report (as told to nursing staff) the patient monthly does cocaine for the first few days of the month following receiving her check.  This could explain these changes and the tachycardia - TTE pending - If TTE reveals Right heart strain, would consider VQ scan (CKD) to check for PE, though she is not hypoxic and this does not appear to be an acute issue. Wells score is 1.5 (low) - Continue Good BP control with home regimen, uptitrate as necessary.  - Morphine PRN  Other issues as per resident note.   Sid Falcon, MD 1/6/201410:24 AM

## 2012-07-13 NOTE — Consult Note (Signed)
Cardiology IP Consult  Reason for Consult: EKG changes Referring Physician: Dr. Nicoletta Dress  HPI: Nancy [name] is a 72 y.o.female with CKD and HTN who presents to the ED with n/v.  She became ill yesterday and presented to the ED then and was sent home with suspected gastroenteritis. She did not improve over the next 24hrs and came back today.  She complains of crampy epigastric pain as well as intermittent vomiting.  While in the ED she complained of burning chest pain that occurred shortly after vomiting.  She was given nitro x 3 with some improvement.  On my evaluation she is pain free.  She does not have a history of MI or prior chest pain.  She has no exertional angina.  She has longstanding CKD and is followed by nephrology.  She has not started hemodialysis yet.  Currently she is pain free.     Past Medical History  Diagnosis Date  . CVA (cerebrovascular accident)     New hemorrhagic per CT scan '09  . Fecal impaction   . Diverticulosis of colon   . Hypertension   . Pulmonary nodule   . Dysfunctional uterine bleeding   . Postmenopausal   . OA (osteoarthritis)     bilateral knees  . Colitis   . Bacterial sinusitis 09/17/2011  . TINEA CRURIS 01/12/2007  . HERNIORRHAPHY, HX OF 08/11/2006  . CKD (chronic kidney disease) stage 4, GFR 15-29 ml/min 08/11/2006    Cr continues to increase. Proteinuria on UA 02/10/12.      Past Surgical History  Procedure Date  . Inguinal hernia repair 2008  . Abdominal hysterectomy   . Cholecystectomy 2009  . Iridotomy / iridectomy     Laser, right eye 12/26/11 left eye 01/24/12    Family History  Problem Relation Age of Onset  . Hypertension Mother   . Heart attack Mother   . Heart disease Mother     Social History:  reports that she has never smoked. She does not have any smokeless tobacco history on file. She reports that she does not drink alcohol or use illicit drugs.  Allergies:  Allergies  Allergen Reactions  . Hydrocodone-Acetaminophen     REACTION:  Nausea, vomiting and dizziness.    Current Facility-Administered Medications  Medication Dose Route Frequency Provider Last Rate Last Dose  . heparin injection 5,000 Units  5,000 Units Subcutaneous Q8H Dorothy Spark, MD      . metoprolol (LOPRESSOR) injection 5 mg  5 mg Intravenous STAT Na Li, MD      . sodium chloride 0.9 % bolus 1,000 mL  1,000 mL Intravenous Once Delora Fuel, MD       Current Outpatient Prescriptions  Medication Sig Dispense Refill  . amLODipine (NORVASC) 10 MG tablet Take 1 tablet (10 mg total) by mouth daily.  30 tablet  6  . cloNIDine (CATAPRES) 0.2 MG tablet Take 1 tablet (0.2 mg total) by mouth 2 (two) times daily.  60 tablet  11  . famotidine (PEPCID) 40 MG tablet take 1 tablet by mouth once daily  30 tablet  11  . furosemide (LASIX) 40 MG tablet Take 40 mg by mouth daily.      . ondansetron (ZOFRAN-ODT) 4 MG disintegrating tablet Take 4 mg by mouth every 4 (four) hours as needed. For nausea      . Travoprost, BAK Free, (TRAVATAN) 0.004 % SOLN ophthalmic solution Place 1 drop into both eyes at bedtime.        ROS:  A full review of systems is obtained and is negative except as noted in the HPI.  Physical Exam: Blood pressure 131/72, pulse 122, temperature 99.1 F (37.3 C), temperature source Oral, resp. rate 16, SpO2 93.00%.  GENERAL: no acute distress.  EYES: Extra ocular movements are intact. There is no lid lag. Sclera is anicteric.  ENT: Oropharynx is clear. Dentition is within normal limits.  NECK: Supple. The thyroid is not enlarged.  LYMPH: There are no masses or lymphadenopathy present.  HEART: tachy without m/r/g, no JVD LUNGS: Clear to auscultation There are no rales, rhonchi, or wheezes.  ABDOMEN: Soft, non-tender, and non-distended with normoactive bowel sounds. There is no hepatosplenomegaly.  EXTREMITIES: No clubbing, cyanosis, or edema.  PULSES:  DP/PT pulses were +2 and equal bilaterally.  SKIN: Warm, dry, and intact.  NEUROLOGIC: The  patient was oriented to person, place, and time. No overt neurologic deficits were detected.  PSYCH: Normal judgment and insight, mood is appropriate.    Results: Results for orders placed during the hospital encounter of 07/12/12 (from the past 24 hour(s))  CBC     Status: Abnormal   Collection Time   07/12/12  8:00 PM      Component Value Range   WBC 10.2  4.0 - 10.5 K/uL   RBC 3.93  3.87 - 5.11 MIL/uL   Hemoglobin 11.3 (*) 12.0 - 15.0 g/dL   HCT 35.5 (*) 36.0 - 46.0 %   MCV 90.3  78.0 - 100.0 fL   MCH 28.8  26.0 - 34.0 pg   MCHC 31.8  30.0 - 36.0 g/dL   RDW 13.9  11.5 - 15.5 %   Platelets 242  150 - 400 K/uL  POCT I-STAT TROPONIN I     Status: Normal   Collection Time   07/12/12  8:29 PM      Component Value Range   Troponin i, poc 0.02  0.00 - 0.08 ng/mL   Comment 3           CG4 I-STAT (LACTIC ACID)     Status: Normal   Collection Time   07/12/12  8:32 PM      Component Value Range   Lactic Acid, Venous 0.96  0.5 - 2.2 mmol/L  COMPREHENSIVE METABOLIC PANEL     Status: Abnormal   Collection Time   07/12/12  9:20 PM      Component Value Range   Sodium 139  135 - 145 mEq/L   Potassium 4.1  3.5 - 5.1 mEq/L   Chloride 103  96 - 112 mEq/L   CO2 22  19 - 32 mEq/L   Glucose, Bld 139 (*) 70 - 99 mg/dL   BUN 32 (*) 6 - 23 mg/dL   Creatinine, Ser 3.45 (*) 0.50 - 1.10 mg/dL   Calcium 9.5  8.4 - 10.5 mg/dL   Total Protein 7.9  6.0 - 8.3 g/dL   Albumin 3.8  3.5 - 5.2 g/dL   AST 20  0 - 37 U/L   ALT 11  0 - 35 U/L   Alkaline Phosphatase 67  39 - 117 U/L   Total Bilirubin 0.3  0.3 - 1.2 mg/dL   GFR calc non Af Amer 13 (*) >90 mL/min   GFR calc Af Amer 15 (*) >90 mL/min  LIPASE, BLOOD     Status: Normal   Collection Time   07/12/12  9:20 PM      Component Value Range   Lipase 28  11 - 59  U/L    CXR: no acute abn EKG: Sinus tachycardia with inferolateral ST depression, ischemia vs LVH.  Similar to previous EKG in 2008 but ST changes more prominent CT scan Abd: noted pericardial  effusion   Assessment/Plan: 72 yo AAF with advanced CKD, HTN, HLD, and previous hemorrhagic stroke in 2009 here with suspected viral gastroenteritis. She also had episode of chest pain at rest earlier that resolved with nitro. 1. Chest Pain: somewhat atypical but patient with multiple risk factors.  Suspect is was due to vomiting.  EKG changes are likely due to LVH rather than ischemia.  HR is currently 120. - ASA 325  - follow cardiac enzymes - would not recommend heparin at this time as long as cardiac enzymes are negative - check echocardiogram in AM to eval LV function and effusion - Hypoxia (90%) seems out of proportion.  If tachycardia does not improve with IV fluid resuscitation, VQ scan to r/o pulmonary embolism would not be unreasonable.  - further recs pending echo 2. Gastroenteritis:  - IV fluids per primary team, currently volume depleted 3. HTN:  - continue home meds, would hold lasix for now 4. CKD: stable - follows with nephrology   [place] 07/13/2012, 4:51 AM

## 2012-07-13 NOTE — ED Notes (Signed)
Pt reports throwing up when drinking CT contrast; pt pointed to green emesis; white phlegm noted on napkin; encouraged pt to drink more of her contrast;

## 2012-07-13 NOTE — Discharge Summary (Signed)
[facility] Discharge Note  Name: Nancy [name] MRN: [id number] DOB: 23-Dec-1940 72 y.o.  Date of Admission: 07/12/2012  7:27 PM Date of Discharge: 07/14/2012 Attending Physician: Sid Falcon, MD  Discharge Diagnosis: 1. Acute gastroenteritis  2. Hypertension  3. Hyperlipidemia  4. Stage IV CKD 5. GERD    Discharge Medications:   Medication List     As of 07/14/2012  8:25 AM    TAKE these medications         amLODipine 10 MG tablet   Commonly known as: NORVASC   Take 1 tablet (10 mg total) by mouth daily.      atorvastatin 80 MG tablet   Commonly known as: LIPITOR   Take 1 tablet (80 mg total) by mouth daily.      cloNIDine 0.2 MG tablet   Commonly known as: CATAPRES   Take 1 tablet (0.2 mg total) by mouth 2 (two) times daily.      famotidine 40 MG tablet   Commonly known as: PEPCID   take 1 tablet by mouth once daily      furosemide 40 MG tablet   Commonly known as: LASIX   Take 40 mg by mouth daily.      ondansetron 4 MG disintegrating tablet   Commonly known as: ZOFRAN-ODT   Take 4 mg by mouth every 4 (four) hours as needed. For nausea      ondansetron 4 MG tablet   Commonly known as: ZOFRAN   Take 1 tablet (4 mg total) by mouth every 4 (four) hours as needed for nausea.      Travoprost (BAK Free) 0.004 % Soln ophthalmic solution   Commonly known as: TRAVATAN   Place 1 drop into both eyes at bedtime.         Disposition and follow-up:   Nancy [name] was discharged from [facility] in stable condition.  At the hospital follow up visit please address 1) Resolution of acute viral gastroenteritis Please address resolution of symptoms. 2) Substance abuse UDS + barbituates and cocaine. Please counsel on drug cessation.  3) Hyperlipidemia Patient w hyperlipidemia on panel in March 2013. Started Atorvastatin 80mg  daily. Please assess tolerance of statin therapy. 4) Hypertension Patient has been  uncontrolled on amlodipine, clonidine, furosemide. No new blood pressure meds started in setting of acute gastroenteritis and hypovolemia. Please assess need for additional antihypertensives when acute issues have resolved. Beta blocker not prescribed in setting of heavy recent cocaine use.   Follow-up Appointments: Follow-up Information    Follow up with Santa Lighter, MD. On 07/24/2012. (Please come to your appointment at 10:15)    Contact information:   [address] [place] [address] [phone]         Discharge Orders    Future Appointments: Provider: Department: Dept Phone: Center:   07/24/2012 10:30 AM Neta Ehlers, MD [place] [phone] [facility]     Future Orders Please Complete By Expires   Diet - low sodium heart healthy      Increase activity slowly      Call MD for:  temperature >100.4      Call MD for:  severe uncontrolled pain      Call MD for:  difficulty breathing, headache or visual disturbances      Call MD for:  persistant dizziness or light-headedness         Consultations: Treatment Team:  Rounding Lbcardiology, MD  Procedures Performed:  Ct Abdomen Pelvis Wo Contrast  07/13/2012  *RADIOLOGY REPORT*  Clinical Data: Abdominal pain and nausea for 2 days.  CT ABDOMEN AND PELVIS WITHOUT CONTRAST  Technique:  Multidetector CT imaging of the abdomen and pelvis was performed following the standard protocol without intravenous contrast.  Comparison: Chest and two views abdomen 07/12/2012.  CT abdomen and pelvis 08/15/2008.  Findings: Dependent atelectatic change is seen in the lung bases. A 0.5 cm right lower lobe pulmonary nodule is unchanged on image number 9.  There is no pleural or pericardial effusion.  The patient is status post cholecystectomy.  The liver, spleen, adrenal glands, pancreas and kidneys are unremarkable.  Fibroid uterus is again seen.  Urinary bladder is unremarkable.  Scattered diverticulosis without  diverticulitis is noted.  The colon is otherwise unremarkable.  The appendix, stomach and small bowel appear normal.  No lymphadenopathy or fluid is identified.  There is no focal bony abnormality.  IMPRESSION:  1.  No acute finding. 2.  Unchanged small to moderate pericardial effusion. 3.  Fibroid uterus. 4.  Status post cholecystectomy. 5.  Diverticulosis without diverticulitis.   Original Report Authenticated By: Orlean Patten, M.D.    Dg Chest Portable 1 View  07/12/2012  *RADIOLOGY REPORT*  Clinical Data: Chest pain with emesis.  PORTABLE CHEST - 1 VIEW  Comparison: 07/12/2012  Findings: Mildly enlarged cardiac silhouette. Mild bronchitic change persists. Minimal left base atelectasis.  No effusion or pneumothorax.  Atherosclerosis of the transverse arch.  Negative osseous structures.  IMPRESSION: Stable bronchitic change.   Original Report Authenticated By: Rolla Flatten, M.D.    Dg Abd Acute W/chest  07/12/2012  *RADIOLOGY REPORT*  Clinical Data: Nausea, vomiting, right lower abdominal pain, history coronary disease, hypertension, chronic kidney disease  ACUTE ABDOMEN SERIES (ABDOMEN 2 VIEW & CHEST 1 VIEW)  Comparison: Abdominal radiograph 08/17/2008, chest radiograph 08/15/2008  Findings: Minimal enlargement of cardiac silhouette. Pulmonary vascular congestion. Stable mediastinal contours with atherosclerotic calcification at aortic arch. Bronchitic changes with minimal left basilar atelectasis. Remaining lungs clear. Nonobstructive bowel gas pattern. No bowel dilatation, bowel wall thickening, or free intraperitoneal air. Surgical clips right upper quadrant question cholecystectomy. Pelvic phleboliths with additional questionable calcification versus retained contrast material in the inferior pelvis. No urinary tract calcification.  IMPRESSION: Minimal enlargement of cardiac silhouette. Bronchitic changes with minimal left basilar atelectasis. Nonobstructive bowel gas pattern.   Original Report  Authenticated By: Lavonia Dana, M.D.     2D Echo:  Study Conclusions - Left ventricle: The cavity size was moderately dilated. Systolic function was vigorous. The estimated ejection fraction was in the range of 65% to 70%. There was dynamic obstruction at rest in the mid cavity. Wall motion was normal; there were no regional wall motion abnormalities. Doppler parameters are consistent with abnormal left ventricular relaxation (grade 1 diastolic dysfunction). Doppler parameters are consistent with high ventricular filling pressure. - Aortic valve: Valve area: 1.34cm^2(VTI). Valve area: 1.1cm^2 (Vmax). - Mitral valve: Calcified annulus. - Left atrium: The atrium was moderately dilated. - Pulmonary arteries: Systolic pressure was moderately increased. PA peak pressure: 68mm Hg (S).  Admission HPI:  This is a 72 year old woman with hyperlipidemia, hypertension, and stage IV CKD; presenting to the emergency department with nausea, vomiting, and epigastric pain. She was in her normal state of health on Saturday (48 hours prior to presentation) and ate at [employer]. When she got home following dinner, she started experiencing epigastric pain, nausea, and vomiting. This worsened over night and throughout Sunday. She  came to the emergency department early in the morning on Sunday where she was found to have some EKG changes but negative troponins and was sent home with a diagnosis of gastroenteritis versus food poisoning. As her epigastric pain, nausea, and vomiting worsened; she came back to the emergency department Sunday evening and we were called early Monday morning for an admission. The quality of the pain is described as dull and crampy. The pain is severe (10 out of 10). It is located in the left upper quadrant and epigastrium. It does not radiate. The pain is constant. There are no aggravating factors. Morphine given in the emergency department did make the pain better for a time. She denies any other  associated symptoms. On review of systems, she denies headaches, dizziness, blurred vision, diplopia, loss of vision, tinnitus, congestion, sore throat, dyspnea, diarrhea, constipation, hematochezia, hematuria, and dysuria. She does report a minor cough that is productive of white/clear sputum and the sensation of her heart racing while she is coughing.    Hospital Course by problem list:  1. Acute gastroenteritis:  Upon admission, patient's constellation of symptoms were most consistent with acute viral gastroenteritis or food poisoning. Her tachycardia was mostly likely explained by hypovolemia in setting of nausea and vomiting. We treated her supportively with IV fluid resuscitation and antiemetics. Daughter arrived to the hospital later in the day and reported mother had just completed several-day cocaine binge. This was supported by +UDS and also likely contributing to sympathetic excess and tachycardia.  Less likely, though still a possibility, was ACS with atypical angina . Her risk factors included family history, hyperlipidemia, and hypertension. She had a normal chest CXR and EKG showed some inferolateral ST depression. Cardiology reviewed EKG and thought changes more consistent with LVH rather than ischemia, but recommended cycling CE and obtaining echocardiogram. She had negative troponin x3. Echo with normal EF and wall motion. Cardiologist reading echo noted  "a dynamic obstruction at rest in the mid cavity," which he described to [place] as very mild and clinically insignificant.  By the end of hospital day 1, tachycardia had resolved and patient tolerating clear liquid diet. She was discharged to home. Discharge was delayed by lack of transportation.    2. Hypertension:  At home, she takes furosemide 40 mg twice a day, clonidine 0.2 mg twice a day amlodipine 10 mg daily. Despite this 3 drug regimen, her blood pressures remain uncontrolled with systolic pressures ranging in the 140s to [id number] and  diastolics ranging from [id number]. We continued her home clonidine and amlodipine, and held furosemide in setting of volume depletion. She was normotensive on day of discharge, and sent home on all of her home medication regimen.  Beta blocker not started in setting of heavy recent cocaine use.   3. Hyperlipidemia:  She is not currently on a cholesterol-lowering medicine. Her last lipid panel in March 2013: Cholesterol 200, triglycerides 218, HDL 40, LDL 116. She almost assuredly has coronary artery disease and would benefit from a high-intensity statin therapy going forward. We started atorvastatin 80mg  daily to be continued upon discharge.  4. Stage IV CKD:  Her GFR is now in the teens, and her creatinine has been ranging around the 3.4 marked. She is followed by Dr. Mercy Moore at [facility] where discussions have been had about the possibility of dialysis. She maintains a position against dialysis, but Dr. Mercy Moore feels she will decide to go on dialysis once the time comes. She takes furosemide 40 mg twice a day  at home.  This was held during admission in setting of volume depletion, but resumed upon discharge.    5. GERD:  At home, she takes famotidine 40 mg daily, continued while inpatient.   Discharge Vitals:  BP 124/74  Pulse 83  Temp 98.3 F (36.8 C) (Oral)  Resp 16  Ht 5\' 3"  (1.6 m)  Wt 190 lb 7.6 oz (86.4 kg)  BMI 33.74 kg/m2  SpO2 96%  Discharge Labs:  Results for orders placed during the hospital encounter of 07/12/12 (from the past 24 hour(s))  TROPONIN I     Status: Normal   Collection Time   07/13/12 11:43 AM      Component Value Range   Troponin I <0.30  <0.30 ng/mL  URINE RAPID DRUG SCREEN (HOSP PERFORMED)     Status: Abnormal   Collection Time   07/14/12  4:40 AM      Component Value Range   Opiates POSITIVE (*) NONE DETECTED   Cocaine POSITIVE (*) NONE DETECTED   Benzodiazepines NONE DETECTED  NONE DETECTED   Amphetamines NONE DETECTED  NONE DETECTED    Tetrahydrocannabinol NONE DETECTED  NONE DETECTED   Barbiturates POSITIVE (*) NONE DETECTED    Signed: Tonia Brooms 07/14/2012, 8:25 AM   Time Spent on Discharge: 35 min Services Ordered on Discharge: none Equipment Ordered on Discharge: none

## 2012-07-13 NOTE — ED Notes (Signed)
MD at bedside. 

## 2012-07-13 NOTE — Progress Notes (Signed)
Echocardiogram 2D Echocardiogram has been performed.  Nancy [name] 07/13/2012, 12:18 PM

## 2012-07-13 NOTE — H&P (Signed)
Hospital Admission Note Date: 07/13/2012  Patient name: Nancy [name] Medical record number: [id number] Date of birth: August 22, 1940 Age: 72 y.o. Gender: female PCP: Gilles Chiquito, MD   Service:  Internal Medicine Teaching Service   Attending Physician:  Dr. Gilles Chiquito   Chief Complaint:  Nausea, vomiting, abdominal pain     History of Present Illness:  This is a 72 year old woman with hyperlipidemia, hypertension, and stage IV CKD; presenting to the emergency department with nausea, vomiting, and epigastric pain. She was in her normal state of health on Saturday (48 hours prior to presentation) and ate at [place] [employer]. When she got home following dinner, she started experiencing epigastric pain, nausea, and vomiting. This worsened over night and throughout Sunday. She came to the emergency department early in the morning on Sunday where she was found to have some EKG changes but negative troponins and was sent home with a diagnosis of gastroenteritis versus food poisoning. As her epigastric pain, nausea, and vomiting worsened; she came back to the emergency department Sunday evening and we were called early Monday morning for an admission. The quality of the pain is described as dull and crampy. The pain is severe (10 out of 10). It is located in the left upper quadrant and epigastrium. It does not radiate. The pain is constant. There are no aggravating factors. Morphine given in the emergency department did make the pain better for a time. She denies any other associated symptoms. On review of systems, she denies headaches, dizziness, blurred vision, diplopia, loss of vision, tinnitus, congestion, sore throat, dyspnea, diarrhea, constipation, hematochezia, hematuria, and dysuria. She does report a minor cough that is productive of white/clear sputum and the sensation of her heart racing while she is coughing.    Review of Systems:   Constitutional: Negative.  Negative for fever and chills.    HENT: Negative.  Negative for congestion, sore throat and tinnitus.   Eyes: Negative.  Negative for blurred vision and double vision.  Respiratory: Positive for cough and sputum production (white/clear). Negative for shortness of breath.   Cardiovascular: Positive for palpitations (Heart racing). Negative for chest pain and leg swelling.  Gastrointestinal: Positive for nausea, vomiting and abdominal pain. Negative for diarrhea, constipation and blood in stool.  Genitourinary: Negative.  Negative for dysuria and hematuria.  Neurological: Negative.  Negative for dizziness and headaches.     Medical History: Past Medical History  Diagnosis Date  . CVA (cerebrovascular accident)     New hemorrhagic per CT scan '09  . Fecal impaction   . Diverticulosis of colon   . Hypertension   . Pulmonary nodule   . Dysfunctional uterine bleeding   . Postmenopausal   . OA (osteoarthritis)     bilateral knees  . Colitis   . Bacterial sinusitis 09/17/2011  . TINEA CRURIS 01/12/2007  . HERNIORRHAPHY, HX OF 08/11/2006  . CKD (chronic kidney disease) stage 4, GFR 15-29 ml/min 08/11/2006    Cr continues to increase. Proteinuria on UA 02/10/12.      Surgical History: Past Surgical History  Procedure Date  . Inguinal hernia repair 2008  . Abdominal hysterectomy   . Cholecystectomy 2009  . Iridotomy / iridectomy     Laser, right eye 12/26/11 left eye 01/24/12    Home Medications: Current Outpatient Rx  Name  Route  Sig  Dispense  Refill  . AMLODIPINE BESYLATE 10 MG PO TABS   Oral   Take 1 tablet (10 mg total) by mouth daily.  30 tablet   6   . CLONIDINE HCL 0.2 MG PO TABS   Oral   Take 1 tablet (0.2 mg total) by mouth 2 (two) times daily.   60 tablet   11   . FAMOTIDINE 40 MG PO TABS      take 1 tablet by mouth once daily   30 tablet   11   . FUROSEMIDE 40 MG PO TABS   Oral   Take 40 mg by mouth daily.         Marland Kitchen ONDANSETRON 4 MG PO TBDP   Oral   Take 4 mg by mouth every 4 (four)  hours as needed. For nausea         . TRAVOPROST (BAK FREE) 0.004 % OP SOLN   Both Eyes   Place 1 drop into both eyes at bedtime.           Allergies: Allergies as of 07/12/2012 - Review Complete 07/12/2012  Allergen Reaction Noted  . Hydrocodone-acetaminophen      Family History: Family History  Problem Relation Age of Onset  . Hypertension Mother   . Heart attack Mother   . Heart disease Mother     Social History: Social History  . Marital Status: Married    Spouse Name: N/A    Number of Children: N/A  . Years of Education: N/A   Social History Main Topics  . Smoking status: Never Smoker   . Smokeless tobacco: Not on file  . Alcohol Use: No  . Drug Use: No     Comment: 08/15/08 UDS + cocaine  . Sexually Active: Not on file   Social History Narrative   Married, lives with her husband. 1 child.     Physical exam: Filed Vitals:   07/13/12 0200  BP:  161/82  Pulse:  115   Temp:  99.1   Resp:  21    GENERAL: Overweight; no acute distress HEAD: atraumatic, normocephalic EYES: pupils equal, round and reactive; sclera anicteric; normal conjunctiva NOSE/THROAT: oropharynx clear, moist mucous membranes NECK: supple, no carotid bruits, thyroid normal in size and without palpable nodules LYMPH: no cervical or supraclavicular lymphadenopathy LUNGS: clear to auscultation bilaterally, normal work of breathing HEART: normal rate and regular rhythm; normal S1 and S2 without S3 or S4; 2/6 systolic murmur heard best at the upper sternal borders; no rubs or clicks PULSES: radial 2+ and symmetric ABDOMEN: Soft, diffusely tender throughout, markedly tender with palpation of the left upper quadrant and epigastrium, normal bowel sounds, no masses palpated SKIN: warm, dry, intact, normal turgor, no rashes EXTREMITIES: no peripheral edema, clubbing, or cyanosis PSYCH: patient is alert and oriented, mood and affect are normal and congruent, thought content is normal without  delusions, thought process is linear     Lab results: Basic Metabolic Panel:  [facility] 07/12/12 2120 07/12/12 0319  NA 139 138  K 4.1 4.2  CL 103 101  CO2 22 21  GLUCOSE 139* 150*  BUN 32* 35*  CREATININE 3.45* 3.54*  CALCIUM 9.5 9.7  MG -- --  PHOS -- --    Liver Function Tests:  [facility] 07/12/12 2120 07/12/12 0319  AST 20 19  ALT 11 12  ALKPHOS 67 71  BILITOT 0.3 0.2*  PROT 7.9 8.4*  ALBUMIN 3.8 3.9    Basename 07/12/12 2120 07/12/12 0319  LIPASE 28 38  AMYLASE -- --    CBC:  Basename 07/12/12 2000 07/12/12 0319  WBC 10.2 12.0*  NEUTROABS -- 11.1*  HGB 11.3* 11.7*  HCT 35.5* 36.1  MCV 90.3 89.6  PLT 242 234    Cardiac Enzymes:  Basename 07/12/12 0603 07/12/12 0319  CKTOTAL -- --  CKMB -- --  CKMBINDEX -- --  TROPONINI <0.30 <0.30    Urinalysis:  Basename 07/12/12 0340  COLORURINE YELLOW  LABSPEC 1.015  PHURINE 6.5  GLUCOSEU NEGATIVE  HGBUR SMALL*  BILIRUBINUR NEGATIVE  KETONESUR NEGATIVE  PROTEINUR >300*  UROBILINOGEN 1.0  NITRITE NEGATIVE  LEUKOCYTESUR NEGATIVE    Imaging results: Ct Abdomen Pelvis Wo Contrast 07/13/2012   FINDINGS: Dependent atelectatic change is seen in the lung bases. A 0.5 cm right lower lobe pulmonary nodule is unchanged on image number 9.  There is no pleural or pericardial effusion.  The patient is status post cholecystectomy.  The liver, spleen, adrenal glands, pancreas and kidneys are unremarkable.  Fibroid uterus is again seen.  Urinary bladder is unremarkable.  Scattered diverticulosis without diverticulitis is noted.  The colon is otherwise unremarkable.  The appendix, stomach and small bowel appear normal.  No lymphadenopathy or fluid is identified.  There is no focal bony abnormality.  IMPRESSION:  1.  No acute finding. 2.  Unchanged small to moderate pericardial effusion. 3.  Fibroid uterus. 4.  Status post cholecystectomy. 5.  Diverticulosis without diverticulitis.  Dg Chest Portable 1 View 07/12/2012   FINDINGS: Mildly enlarged cardiac silhouette. Mild bronchitic change persists. Minimal left base atelectasis.  No effusion or pneumothorax.  Atherosclerosis of the transverse arch.  Negative osseous structures.   IMPRESSION: Stable bronchitic change.  Dg Abd Acute W/chest 07/12/2012  FINDINGS: Minimal enlargement of cardiac silhouette. Pulmonary vascular congestion. Stable mediastinal contours with atherosclerotic calcification at aortic arch. Bronchitic changes with minimal left basilar atelectasis. Remaining lungs clear. Nonobstructive bowel gas pattern. No bowel dilatation, bowel wall thickening, or free intraperitoneal air. Surgical clips right upper quadrant question cholecystectomy. Pelvic phleboliths with additional questionable calcification versus retained contrast material in the inferior pelvis. No urinary tract calcification.   IMPRESSION: Minimal enlargement of cardiac silhouette. Bronchitic changes with minimal left basilar atelectasis. Nonobstructive bowel gas pattern.    Other results: EKG Results:  07/13/2012 Rate:  117 PR:  132 QRS:  86 QTc:  424 EKG: sinus tachycardia; ST depression in V6, V5, V4, and borderline V3; 1 mm ST elevation in V1 and slight elevation in V2; T wave inversion in 1.   Assessment and Plan:  1.   Epigastric pain:  I think this most likely represents acute viral gastroenteritis or food poisoning. Her history of nausea, vomiting, and abdominal pain all fit with this. Treatment for this will be supportive in nature with symptom management and hydration. Less likely, though still a real possibility, is acute coronary syndrome with unstable angina (given the EKG findings noted above). Her risk factors include family history, hyperlipidemia, and hypertension.  If this is ACS, it is certainly presenting with atypical angina; she does not have the usual associated symptoms of diaphoresis and dyspnea. Still, with her age and risk factors, this possibility cannot be  overlooked. Her chest x-ray largely rules out pulmonary etiologies. We have given her 3 sublingual nitroglycerin with some improvement in her pain, and she will be given an aspirin. Cardiology feels the EKG changes are most consistent with a structural heart disease. They agree they could represent demand ischemia or less likely, unstable angina; but they do not feel it warrants initiation of ACS treatment protocols. Rather, they suggest an echocardiogram in the morning. - Aspirin 325 now  and then 81 daily - Atorvastatin 80 mg - Carvedilol 3.125 mg twice a day and metoprolol as needed - Morphine 2 mg hourly as needed for pain - Ondansetron 4 mg every 6 hours as needed for nausea - TSH - Echocardiogram - Cardiac enzymes  2.   Hypertension:  At home, she takes furosemide 40 mg twice a day, clonidine 0.2 mg twice a day amlodipine 10 mg daily. Despite this 3 drug regimen, her blood pressures remain uncontrolled with systolic pressures ranging in the 140s to [id number] and diastolics ranging from [id number]. Here in the emergency department, her blood pressures have been much more elevated with systolics in the [id number]. We will continue her home clonidine and add carvedilol and metoprolol as above for blood pressure control. We are holding her furosemide and amlodipine for now. - Continue clonidine 0.2 mg twice a day - Hold furosemide and amlodipine  3.   Hyperlipidemia:  She is not currently on a cholesterol-lowering medicine. Her last lipid panel in March 2013: Cholesterol 200, triglycerides 218, HDL 40, LDL 116. She almost assuredly has coronary artery disease and would benefit from a high-intensity statin therapy going forward. We are starting atorvastatin as above.  4.   Stage IV CKD:  Her GFR is now in the teens, and her creatinine has been ranging around the 3.4 marked. She is followed by Dr. Mercy Moore at [facility] where discussions have been had about the possibility of dialysis. She maintains a position  against dialysis, but Dr. Mercy Moore feels she will decide to go on dialysis once the time comes. She takes furosemide 40 mg twice a day at home and has received a liter bolus of normal saline in the emergency department. Because of her kidney function we're going to both hold off on  furosemide and further fluid resuscitation for now.  5.   GERD:  At home, she takes famotidine 40 mg daily.  - Continue famotidine 20 mg daily   6.   Prophylaxis: Heparin 5,000 units [place] TID  7.   Disposition:  The patient's PCP is Dr. Daryll Drown and will require [facility] followup. Expected length of stay is less than 2 days.     Signed by:  Shann Medal. Juleen China, MD PGY-I, Internal Medicine  07/13/2012, 3:39 AM

## 2012-07-13 NOTE — ED Provider Notes (Signed)
Is in care of patient from Dr. Roxanne Mins at 11 PM. Patient's second visit with abdominal pain nausea and vomiting. She is tachycardic. Abdomen soft. Awaiting CT scan.  CT scan shows no acute findings. She remained tachycardic in the 1 teens and feeling "terrible". Will admit for further hydration.  D/w [facility] residents.  BP 131/72  Pulse 122  Temp 99.1 F (37.3 C) (Oral)  Resp 16  SpO2 93%   Ezequiel Essex, MD 07/13/12 [phone]

## 2012-07-13 NOTE — Progress Notes (Signed)
Pts daughter notified of pts discharge scheduled for 07/14/12 and new room number 5501.

## 2012-07-13 NOTE — Progress Notes (Signed)
1845 Patient arrived to floor from 2900 . Placed on telemetry SR rate 94

## 2012-07-13 NOTE — ED Notes (Signed)
Called CT to inform them that Dr. Wyvonnia Dusky stated that it was okay for pt to go to CT with only half of her contrast drank; pt still nauseated from drinking contrast; 4mg  of Zofran given to pt;

## 2012-07-14 DIAGNOSIS — A088 Other specified intestinal infections: Principal | ICD-10-CM

## 2012-07-14 DIAGNOSIS — I10 Essential (primary) hypertension: Secondary | ICD-10-CM

## 2012-07-14 DIAGNOSIS — N184 Chronic kidney disease, stage 4 (severe): Secondary | ICD-10-CM

## 2012-07-14 DIAGNOSIS — R079 Chest pain, unspecified: Secondary | ICD-10-CM

## 2012-07-14 LAB — RAPID URINE DRUG SCREEN, HOSP PERFORMED
Amphetamines: NOT DETECTED
Cocaine: POSITIVE — AB
Opiates: POSITIVE — AB
Tetrahydrocannabinol: NOT DETECTED

## 2012-07-14 MED ORDER — ATORVASTATIN CALCIUM 80 MG PO TABS: 80.0000 mg | ORAL_TABLET | Freq: Every day | ORAL | Status: DC

## 2012-07-14 NOTE — Progress Notes (Signed)
0955 Discharge instructions reviewed with patient and daughter [name]  verbalize and understands. Skin WNL. Left floor via wheelchair accompanied by husband and daughter.

## 2012-07-14 NOTE — Progress Notes (Signed)
Utilization review complete 

## 2012-07-14 NOTE — Progress Notes (Signed)
Nancy [name] is a 72 y.o. female patient who transferred  from 2900 awake, alert  & orientated  X 3, Full Code, VSS - Blood pressure 129/65, pulse 91, temperature 98.7 F (37.1 C), temperature source Oral, resp. rate 15, height 5\' 3"  (1.6 m), weight 86.3 kg (190 lb 4.1 oz), SpO2 93.00%. RA   IV site WDL: hand left, condition patent and no redness with a transparent dsg that's clean dry and intact.  Allergies:   Allergies  Allergen Reactions  . Hydrocodone-Acetaminophen     REACTION: Nausea, vomiting and dizziness.     Past Medical History  Diagnosis Date  . CVA (cerebrovascular accident)     New hemorrhagic per CT scan '09  . Fecal impaction   . Diverticulosis of colon   . Hypertension   . Pulmonary nodule   . Dysfunctional uterine bleeding   . Postmenopausal   . OA (osteoarthritis)     bilateral knees  . Colitis   . Bacterial sinusitis 09/17/2011  . TINEA CRURIS 01/12/2007  . HERNIORRHAPHY, HX OF 08/11/2006  . CKD (chronic kidney disease) stage 4, GFR 15-29 ml/min 08/11/2006    Cr continues to increase. Proteinuria on UA 02/10/12.      Pt orientation to unit, room and routine. SR up x 2, fall risk assessment complete with Patient and family verbalizing understanding of risks associated with falls. Pt verbalizes an understanding of how to use the call bell and to call for help before getting out of bed.  Skin, clean-dry- intact without evidence of bruising, or skin tears.   No evidence of skin break down noted on exam.   Will cont to monitor and assist as needed.  Darreld Mclean [facility], RN 07/14/2012 12:50 AM

## 2012-07-14 NOTE — Progress Notes (Signed)
TELEMETRY: N/A Filed Vitals:   07/13/12 1800 07/13/12 1840 07/13/12 2202 07/14/12 0433  BP: 140/67 148/81 129/65 124/74  Pulse: 96 96 91 83  Temp:  98.8 F (37.1 C) 98.7 F (37.1 C) 98.3 F (36.8 C)  TempSrc:  Oral Oral Oral  Resp: 13 16 15 16   Height:      Weight:    190 lb 7.6 oz (86.4 kg)  SpO2: 97% 97% 93% 96%    Intake/Output Summary (Last 24 hours) at 07/14/12 0824 Last data filed at 07/14/12 0500  Gross per 24 hour  Intake    840 ml  Output    400 ml  Net    440 ml    SUBJECTIVE Complains of mild burning in mid chest after taking pills. N/V resolved.  LABS: Basic Metabolic Panel:  Basename 07/13/12 0642 07/12/12 2120  NA 137 139  K 4.2 4.1  CL 105 103  CO2 23 22  GLUCOSE 119* 139*  BUN 30* 32*  CREATININE 3.33* 3.45*  CALCIUM 9.2 9.5  MG -- 2.1  PHOS -- --   Liver Function Tests:  [facility] 07/12/12 2120 07/12/12 0319  AST 20 19  ALT 11 12  ALKPHOS 67 71  BILITOT 0.3 0.2*  PROT 7.9 8.4*  ALBUMIN 3.8 3.9    Basename 07/12/12 2120 07/12/12 0319  LIPASE 28 38  AMYLASE -- --   CBC:  Basename 07/13/12 0642 07/12/12 2000 07/12/12 0319  WBC 10.1 10.2 --  NEUTROABS 8.8* -- 11.1*  HGB 10.6* 11.3* --  HCT 33.2* 35.5* --  MCV 91.0 90.3 --  PLT 247 242 --   Cardiac Enzymes:  Basename 07/13/12 1143 07/13/12 0641 07/12/12 0603  CKTOTAL -- -- --  CKMB -- -- --  CKMBINDEX -- -- --  TROPONINI <0.30 <0.30 <0.30   Thyroid Function Tests:  Basename 07/13/12 0642  TSH 1.915  T4TOTAL --  T3FREE --  THYROIDAB --   Anemia Panel: No results found for this basename: VITAMINB12,FOLATE,FERRITIN,TIBC,IRON,RETICCTPCT in the last 72 hours  Radiology/Studies:  Ct Abdomen Pelvis Wo Contrast  07/13/2012  *RADIOLOGY REPORT*  Clinical Data: Abdominal pain and nausea for 2 days.  CT ABDOMEN AND PELVIS WITHOUT CONTRAST  Technique:  Multidetector CT imaging of the abdomen and pelvis was performed following the standard protocol without intravenous contrast.   Comparison: Chest and two views abdomen 07/12/2012.  CT abdomen and pelvis 08/15/2008.  Findings: Dependent atelectatic change is seen in the lung bases. A 0.5 cm right lower lobe pulmonary nodule is unchanged on image number 9.  There is no pleural or pericardial effusion.  The patient is status post cholecystectomy.  The liver, spleen, adrenal glands, pancreas and kidneys are unremarkable.  Fibroid uterus is again seen.  Urinary bladder is unremarkable.  Scattered diverticulosis without diverticulitis is noted.  The colon is otherwise unremarkable.  The appendix, stomach and small bowel appear normal.  No lymphadenopathy or fluid is identified.  There is no focal bony abnormality.  IMPRESSION:  1.  No acute finding. 2.  Unchanged small to moderate pericardial effusion. 3.  Fibroid uterus. 4.  Status post cholecystectomy. 5.  Diverticulosis without diverticulitis.   Original Report Authenticated By: Orlean Patten, M.D.    Dg Chest Portable 1 View  07/12/2012  *RADIOLOGY REPORT*  Clinical Data: Chest pain with emesis.  PORTABLE CHEST - 1 VIEW  Comparison: 07/12/2012  Findings: Mildly enlarged cardiac silhouette. Mild bronchitic change persists. Minimal left base atelectasis.  No effusion or pneumothorax.  Atherosclerosis  of the transverse arch.  Negative osseous structures.  IMPRESSION: Stable bronchitic change.   Original Report Authenticated By: Rolla Flatten, M.D.    Dg Abd Acute W/chest  07/12/2012  *RADIOLOGY REPORT*  Clinical Data: Nausea, vomiting, right lower abdominal pain, history coronary disease, hypertension, chronic kidney disease  ACUTE ABDOMEN SERIES (ABDOMEN 2 VIEW & CHEST 1 VIEW)  Comparison: Abdominal radiograph 08/17/2008, chest radiograph 08/15/2008  Findings: Minimal enlargement of cardiac silhouette. Pulmonary vascular congestion. Stable mediastinal contours with atherosclerotic calcification at aortic arch. Bronchitic changes with minimal left basilar atelectasis. Remaining lungs clear.  Nonobstructive bowel gas pattern. No bowel dilatation, bowel wall thickening, or free intraperitoneal air. Surgical clips right upper quadrant question cholecystectomy. Pelvic phleboliths with additional questionable calcification versus retained contrast material in the inferior pelvis. No urinary tract calcification.  IMPRESSION: Minimal enlargement of cardiac silhouette. Bronchitic changes with minimal left basilar atelectasis. Nonobstructive bowel gas pattern.   Original Report Authenticated By: Lavonia Dana, M.D.    ECHO:Transthoracic Echocardiography  Patient: [place], [place] E MR #: [id number] Study Date: 07/13/2012 Gender: F Age: 72 Height: 160cm Weight: 86.4kg BSA: 31m^2 Pt. Status: Room: [facility], Ec SONOGRAPHER Tresa Res, RDCS ORDERING Rancour, Wayland Salinas Rancour, [place] ATTENDING Gilles Chiquito cc:  ------------------------------------------------------------ LV EF: 65% - 70%  ------------------------------------------------------------ Indications: Chest pain 786.51.  ------------------------------------------------------------ History: PMH: Stroke. Risk factors: Hyperlipidemia. Hypertension.  ------------------------------------------------------------ Study Conclusions  - Left ventricle: The cavity size was moderately dilated. Systolic function was vigorous. The estimated ejection fraction was in the range of 65% to 70%. There was dynamic obstruction at restin the mid cavity. Wall motion was normal; there were no regional wall motion abnormalities. Doppler parameters are consistent with abnormal left ventricular relaxation (grade 1 diastolic dysfunction). Doppler parameters are consistent with high ventricular filling pressure. - Aortic valve: Valve area: 1.34cm^2(VTI). Valve area: 1.1cm^2 (Vmax). - Mitral valve: Calcified annulus. - Left atrium: The atrium was moderately dilated. - Pulmonary arteries:  Systolic pressure was moderately increased. PA peak pressure: 78mm Hg (S). Transthoracic echocardiography. M-mode, complete 2D, spectral Doppler, and color Doppler. Height: Height: 160cm. Height: 63in. Weight: Weight: 86.4kg. Weight: 190lb. Body mass index: BMI: 33.7kg/m^2. Body surface area: BSA: 54m^2. Blood pressure: 160/69. Patient status: Inpatient. Location: ICU/CCU  ------------------------------------------------------------  ------------------------------------------------------------ Left ventricle: The cavity size was moderately dilated. Systolic function was vigorous. The estimated ejection fraction was in the range of 65% to 70%. There was dynamic obstruction at restin the mid cavity. Wall motion was normal; there were no regional wall motion abnormalities. Doppler parameters are consistent with abnormal left ventricular relaxation (grade 1 diastolic dysfunction). Doppler parameters are consistent with high ventricular filling pressure.  ------------------------------------------------------------ Aortic valve: Mildly thickened leaflets. Sclerosis without stenosis. Doppler: VTI ratio of LVOT to aortic valve: 0.76. Valve area: 1.34cm^2(VTI). Indexed valve area: 0.67cm^2/m^2 (VTI). Peak velocity ratio of LVOT to aortic valve: 0.62. Valve area: 1.1cm^2 (Vmax). Indexed valve area: 0.55cm^2/m^2 (Vmax). Mean gradient: 70mm Hg (S). Peak gradient: 61mm Hg (S).  ------------------------------------------------------------ Aorta: The aorta was normal, not dilated, and non-diseased.  ------------------------------------------------------------ Mitral valve: Calcified annulus. Doppler: Peak gradient: 31mm Hg (D).  ------------------------------------------------------------ Left atrium: The atrium was moderately dilated.  ------------------------------------------------------------ Atrial septum: Poorly  visualized.  ------------------------------------------------------------ Right ventricle: The cavity size was normal. Wall thickness was normal. Systolic function was normal.  ------------------------------------------------------------ Pulmonic valve: Poorly visualized.  ------------------------------------------------------------ Tricuspid valve: Doppler: Mild regurgitation.  ------------------------------------------------------------ Pulmonary artery: Poorly visualized. Systolic pressure was moderately increased.  ------------------------------------------------------------ Right atrium: The atrium was normal  in size.  ------------------------------------------------------------ Pericardium: There was no pericardial effusion.  ------------------------------------------------------------ Systemic veins: Inferior vena cava: The vessel was normal in size; the respirophasic diameter changes were in the normal range (= 50%); findings are consistent with normal central venous pressure.  ------------------------------------------------------------ Post procedure conclusions Ascending Aorta:  - The aorta was normal, not dilated, and non-diseased.  ------------------------------------------------------------  2D measurements Normal Doppler measurements Normal Left ventricle Main pulmonary LVID ED, 36.7 mm 43-52 artery chord, Pressure, 46 mm Hg =30 PLAX S LVID ES, 27.9 mm 23-38 Left ventricle chord, Ea, lat 5.04 cm/s ------ PLAX ann, tiss FS, chord, 24 % >29 DP PLAX E/Ea, lat 14.4 ------ LVPW, ED 15.1 mm ------ ann, tiss IVS/LVPW 1.01 <1.3 DP ratio, ED Ea, med 4.61 cm/s ------ Ventricular septum ann, tiss IVS, ED 15.2 mm ------ DP LVOT E/Ea, med 15.7 ------ Diam, S 15 mm ------ ann, tiss 5 Area 1.77 cm^2 ------ DP Aorta LVOT Root diam, 30 mm ------ Peak vel, 151 cm/s ------ ED S Left atrium VTI, S 30.3 cm ------ AP dim 47 mm ------ Peak 9 mm Hg ------ AP dim  2.36 cm/m^2 <2.2 gradient, index S Aortic valve Peak vel, 243 cm/s ------ S Mean vel, 178 cm/s ------ S VTI, S 40.1 cm ------ Mean 14 mm Hg ------ gradient, S Peak 24 mm Hg ------ gradient, S VTI ratio 0.76 ------ LVOT/AV Area, VTI 1.34 cm^2 ------ Area index 0.67 cm^2/m ------ (VTI) ^2 Peak vel 0.62 ------ ratio, LVOT/AV Area, Vmax 1.1 cm^2 ------ Area index 0.55 cm^2/m ------ (Vmax) ^2 Mitral valve Peak E vel 72.6 cm/s ------ Peak A vel 116 cm/s ------ Decelerati 103 ms 150-23 on time 0 Peak 2 mm Hg ------ gradient, D Peak E/A 0.6 ------ ratio Tricuspid valve Regurg 319 cm/s ------ peak vel Peak RV-RA 41 mm Hg ------ gradient, S Systemic veins Estimated 5 mm Hg ------ CVP Right ventricle Pressure, 46 mm Hg <30 S  ------------------------------------------------------------ Prepared and Electronically Authenticated by  Belva Crome. [id number]:[id number]  PHYSICAL EXAM General: Well developed, well nourished, in no acute distress. Head: Normal Neck: Negative for carotid bruits. JVD not elevated. Lungs: Clear bilaterally to auscultation without wheezes, rales, or rhonchi. Breathing is unlabored. Heart: RRR S1 S2 without murmurs, rubs, or gallops.  Abdomen: Soft, non-tender, non-distended with normoactive bowel sounds.  Msk:  Strength and tone appears normal for age. Extremities: No clubbing, cyanosis or edema.  Distal pedal pulses are 2+ and equal bilaterally. Neuro: Alert and oriented X 3. Moves all extremities spontaneously. Psych:  Responds to questions appropriately with a normal affect.  ASSESSMENT AND PLAN: 1. Atypical chest pain. Cardiac enzymes are all negative. Echo shows normal LV function. No pericardial effusion. Only mild pulmonary HTN with normal right heart size. ? If illicit drug use may be playing a role with drug screen positive for cocaine. Pain is most consistent with esophageal pain. No further cardiac workup indicated at  this time. Will defer discussion of drug use to primary team. Please call if further cardiac questions.  2. HTN controlled.  3. Gastroenteritis.  4. CKD.  Principal Problem:  *Chest pain Active Problems:  HYPERLIPIDEMIA  HYPERTENSION  CKD stage 4  EKG, abnormal  Gastroenteritis, acute    Signed, Peter Martinique MD,FACC 07/14/2012 8:29 AM

## 2012-07-24 ENCOUNTER — Ambulatory Visit: Payer: PRIVATE HEALTH INSURANCE | Admitting: Internal Medicine

## 2012-08-04 ENCOUNTER — Ambulatory Visit (INDEPENDENT_AMBULATORY_CARE_PROVIDER_SITE_OTHER): Payer: PRIVATE HEALTH INSURANCE | Admitting: Internal Medicine

## 2012-08-04 ENCOUNTER — Encounter: Payer: Self-pay | Admitting: Internal Medicine

## 2012-08-04 VITALS — BP 138/74 | HR 88 | Temp 97.0°F | Wt 195.0 lb

## 2012-08-04 DIAGNOSIS — I8000 Phlebitis and thrombophlebitis of superficial vessels of unspecified lower extremity: Secondary | ICD-10-CM

## 2012-08-04 DIAGNOSIS — I10 Essential (primary) hypertension: Secondary | ICD-10-CM

## 2012-08-04 DIAGNOSIS — I8001 Phlebitis and thrombophlebitis of superficial vessels of right lower extremity: Secondary | ICD-10-CM

## 2012-08-04 NOTE — Assessment & Plan Note (Signed)
Patient presents with a painful knot on her right calf. Exam reveals a palpable cord that is tender to touch. The rest of her leg is nontender. There is no swelling or redness. She does not have any chest pain, shortness of breath, cough. Likely superficial thrombophlebitis. Wells score indicates low-risk probability of DVT.  -Hot compresses -Discussed red flag symptoms with patient. She agreed to call her clinic by the end of this week and let us know if she is feeling better -She knows to go to the ED if she has any chest pain, hemoptysis, shortness of breath

## 2012-08-04 NOTE — Patient Instructions (Signed)
Please call the clinic by the end of the week and let us know how you are doing.    Please call the Clinic or seek medical attention if you have any of the following:  -Worsening right leg swelling -Hot, swollen, painful calf -Shortness of breath, chest pain, cough, palpitations

## 2012-08-04 NOTE — Progress Notes (Signed)
Internal Medicine Clinic Visit    HPI:  Nancy [name] is a 72 y.o. year old female with a history of hypertension, chronic kidney disease, CVA, who presents as a hospital followup for acute gastroenteritis.  Patient states that she has been doing well since hospital discharge. All her symptoms of gastroenteritis have resolved. She denies having any diarrhea, abdominal pain, nausea, vomiting. No fever or chills.  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  Patient brought all her medications today, including her new medication atorvastatin. She has been taking his medication daily before bed without any difficulties. No muscle aches or pains.  Regarding her substance abuse, patient repeatedly denied ever having used cocaine despite having more than one positive UDS. She also denies using any other drugs including marijuana, PCP, barbiturates, prescription narcotics or benzodiazepines.  Patient is also complaining of a painful spot on leg. She states that she has had this since her hospital discharge. She was on prophylactic anticoagulants while she was in the hospital. She denies having any rash or trauma to the area. No CP, SOB, palpitation, no worsening swelling in legs.    Past Medical History  Diagnosis Date  . CVA (cerebrovascular accident)     New hemorrhagic per CT scan '09  . Fecal impaction   . Diverticulosis of colon   . Hypertension   . Pulmonary nodule   . Dysfunctional uterine bleeding   . Postmenopausal   . OA (osteoarthritis)     bilateral knees  . Colitis   . Bacterial sinusitis 09/17/2011  . TINEA CRURIS 01/12/2007  . HERNIORRHAPHY, HX OF 08/11/2006  . CKD (chronic kidney disease) stage 4, GFR 15-29 ml/min 08/11/2006    Cr continues to increase. Proteinuria on UA 02/10/12.      Past Surgical History  Procedure Date  . Inguinal hernia repair 2008  .  Abdominal hysterectomy   . Cholecystectomy 2009  . Iridotomy / iridectomy     Laser, right eye 12/26/11 left eye 01/24/12     ROS:  A complete review of systems was otherwise negative, except as noted in the HPI.  Allergies: Hydrocodone-acetaminophen  Medications: Current Outpatient Prescriptions  Medication Sig Dispense Refill  . amLODipine (NORVASC) 10 MG tablet Take 1 tablet (10 mg total) by mouth daily.  30 tablet  6  . atorvastatin (LIPITOR) 80 MG tablet Take 1 tablet (80 mg total) by mouth daily.  30 tablet  0  . cloNIDine (CATAPRES) 0.2 MG tablet Take 1 tablet (0.2 mg total) by mouth 2 (two) times daily.  60 tablet  11  . famotidine (PEPCID) 40 MG tablet take 1 tablet by mouth once daily  30 tablet  11  . furosemide (LASIX) 40 MG tablet Take 40 mg by mouth daily.      . ondansetron (ZOFRAN) 4 MG tablet Take 1 tablet (4 mg total) by mouth every 4 (four) hours as needed for nausea.  12 tablet  0  . ondansetron (ZOFRAN-ODT) 4 MG disintegrating tablet Take 4 mg by mouth every 4 (four) hours as needed. For nausea      . Travoprost, BAK Free, (TRAVATAN) 0.004 % SOLN ophthalmic solution Place 1 drop into both eyes at bedtime.        History   Social History  . Marital Status: Married    Spouse Name: N/A    Number of Children: N/A  .  Years of Education: N/A   Occupational History  . Not on file.   Social History Main Topics  . Smoking status: Never Smoker   . Smokeless tobacco: Not on file  . Alcohol Use: No  . Drug Use: No     Comment: 08/15/08 UDS + cocaine  . Sexually Active: Not on file   Other Topics Concern  . Not on file   Social History Narrative   Married, lives with her husband. 1 child.     family history includes Heart attack in her mother; Heart disease in her mother; and Hypertension in her mother.  Physical Exam Blood pressure 138/74, pulse 88, temperature 97 F (36.1 C), temperature source Oral, weight 195 lb (88.451 kg), SpO2 99.00%. General:  No  acute distress, alert and oriented x 3, well-appearing  HEENT:  PERRL, EOMI, no lymphadenopathy, moist mucous membranes Cardiovascular:  Regular rate and rhythm, systolic murmur noted Respiratory:  Clear to auscultation bilaterally, no wheezes, rales, or rhonchi Abdomen:  Soft, nondistended, nontender, positive bowel sounds Extremities:  Warm and well-perfused. Tender, palpable superficial cords noted on the right calf. No erythema, warmth, swelling. Skin: Warm, dry, no rashes Neuro: Not anxious appearing, no depressed mood, normal affect  Labs: Lab Results  Component Value Date   CREATININE 3.33* 07/13/2012   BUN 30* 07/13/2012   NA 137 07/13/2012   K 4.2 07/13/2012   CL 105 07/13/2012   CO2 23 07/13/2012   Lab Results  Component Value Date   WBC 10.1 07/13/2012   HGB 10.6* 07/13/2012   HCT 33.2* 07/13/2012   MCV 91.0 07/13/2012   PLT 247 07/13/2012      Assessment and Plan:    FOLLOWUP: Nancy E Brandstetter will follow back up in our clinic in approximately 1-2 months. Nancy E Kinter knows to call our clinic in the meantime with any questions or new issues.

## 2012-08-04 NOTE — Assessment & Plan Note (Signed)
BP Readings from Last 3 Encounters:  08/04/12 138/74  07/14/12 124/74  07/12/12 146/84    Lab Results  Component Value Date   NA 137 07/13/2012   K 4.2 07/13/2012   CREATININE 3.33* 07/13/2012    Plan:  Medications:  continue current medications  Educational resources provided: brochure  Self management tools provided: home blood pressure logbook  Other plans:   Will continue on current regimen for now. May need reassessment at next visit.  -Continue Lasix 40 twice a day, amlodipine 10 mg daily, clonidine 0.2 mg twice a day

## 2012-08-17 ENCOUNTER — Encounter (HOSPITAL_COMMUNITY): Payer: Self-pay | Admitting: Emergency Medicine

## 2012-08-17 ENCOUNTER — Emergency Department (HOSPITAL_COMMUNITY): Payer: PRIVATE HEALTH INSURANCE

## 2012-08-17 ENCOUNTER — Observation Stay (HOSPITAL_COMMUNITY)
Admission: EM | Admit: 2012-08-17 | Discharge: 2012-08-18 | Disposition: A | Discharge status: Home / Self Care | Payer: PRIVATE HEALTH INSURANCE | Attending: Infectious Disease | Admitting: Infectious Disease

## 2012-08-17 DIAGNOSIS — K529 Noninfective gastroenteritis and colitis, unspecified: Secondary | ICD-10-CM

## 2012-08-17 DIAGNOSIS — R112 Nausea with vomiting, unspecified: Principal | ICD-10-CM

## 2012-08-17 DIAGNOSIS — R000 Tachycardia, unspecified: Secondary | ICD-10-CM

## 2012-08-17 DIAGNOSIS — R079 Chest pain, unspecified: Secondary | ICD-10-CM

## 2012-08-17 DIAGNOSIS — N184 Chronic kidney disease, stage 4 (severe): Secondary | ICD-10-CM

## 2012-08-17 DIAGNOSIS — R9431 Abnormal electrocardiogram [ECG] [EKG]: Secondary | ICD-10-CM

## 2012-08-17 DIAGNOSIS — E785 Hyperlipidemia, unspecified: Secondary | ICD-10-CM

## 2012-08-17 DIAGNOSIS — F1410 Cocaine abuse, uncomplicated: Secondary | ICD-10-CM

## 2012-08-17 DIAGNOSIS — I129 Hypertensive chronic kidney disease with stage 1 through stage 4 chronic kidney disease, or unspecified chronic kidney disease: Secondary | ICD-10-CM | POA: Insufficient documentation

## 2012-08-17 DIAGNOSIS — R109 Unspecified abdominal pain: Secondary | ICD-10-CM

## 2012-08-17 DIAGNOSIS — R1084 Generalized abdominal pain: Secondary | ICD-10-CM | POA: Insufficient documentation

## 2012-08-17 DIAGNOSIS — K5730 Diverticulosis of large intestine without perforation or abscess without bleeding: Secondary | ICD-10-CM

## 2012-08-17 DIAGNOSIS — K219 Gastro-esophageal reflux disease without esophagitis: Secondary | ICD-10-CM

## 2012-08-17 LAB — RAPID URINE DRUG SCREEN, HOSP PERFORMED
Amphetamines: NOT DETECTED
Cocaine: POSITIVE — AB
Opiates: NOT DETECTED
Tetrahydrocannabinol: NOT DETECTED

## 2012-08-17 LAB — BASIC METABOLIC PANEL
BUN: 28 mg/dL — ABNORMAL HIGH (ref 6–23)
Creatinine, Ser: 3.88 mg/dL — ABNORMAL HIGH (ref 0.50–1.10)
GFR calc Af Amer: 13 mL/min — ABNORMAL LOW (ref >90)
GFR calc non Af Amer: 11 mL/min — ABNORMAL LOW (ref >90)
Glucose, Bld: 166 mg/dL — ABNORMAL HIGH (ref 70–99)
Potassium: 3.5 mEq/L (ref 3.5–5.1)

## 2012-08-17 LAB — URINALYSIS, ROUTINE W REFLEX MICROSCOPIC
Bilirubin Urine: NEGATIVE
Ketones, ur: NEGATIVE mg/dL
Nitrite: NEGATIVE
Specific Gravity, Urine: 1.014 (ref 1.005–1.030)
Urobilinogen, UA: 0.2 mg/dL (ref 0.0–1.0)
pH: 5 (ref 5.0–8.0)

## 2012-08-17 LAB — HEPATIC FUNCTION PANEL
ALT: 10 U/L (ref 0–35)
Albumin: 4.1 g/dL (ref 3.5–5.2)
Alkaline Phosphatase: 81 U/L (ref 39–117)
Total Bilirubin: 0.3 mg/dL (ref 0.3–1.2)
Total Protein: 8.2 g/dL (ref 6.0–8.3)

## 2012-08-17 LAB — CBC
HCT: 35.5 % — ABNORMAL LOW (ref 36.0–46.0)
Hemoglobin: 11.9 g/dL — ABNORMAL LOW (ref 12.0–15.0)
MCHC: 33.5 g/dL (ref 30.0–36.0)
MCV: 87.4 fL (ref 78.0–100.0)
RDW: 13.5 % (ref 11.5–15.5)

## 2012-08-17 LAB — TROPONIN I: Troponin I: <0.3 ng/mL (ref <0.30)

## 2012-08-17 LAB — POCT I-STAT TROPONIN I: Troponin i, poc: 0 ng/mL (ref 0.00–0.08)

## 2012-08-17 MED ORDER — FAMOTIDINE IN NACL 20-0.9 MG/50ML-% IV SOLN
20.0000 mg | Freq: Once | INTRAVENOUS | Status: AC
  Administered 2012-08-17: 20 mg via INTRAVENOUS
  Filled 2012-08-17: qty 50

## 2012-08-17 MED ORDER — AMLODIPINE BESYLATE 10 MG PO TABS
10.0000 mg | ORAL_TABLET | Freq: Every day | ORAL | Status: DC
  Filled 2012-08-17: qty 1

## 2012-08-17 MED ORDER — HEPARIN SODIUM (PORCINE) 5000 UNIT/ML IJ SOLN
5000.0000 [IU] | Freq: Three times a day (TID) | INTRAMUSCULAR | Status: DC
  Administered 2012-08-17 – 2012-08-18 (×2): 5000 [IU] via SUBCUTANEOUS
  Filled 2012-08-17 (×5): qty 1

## 2012-08-17 MED ORDER — HYDROMORPHONE HCL PF 1 MG/ML IJ SOLN: 1.0000 mg | INTRAMUSCULAR | Status: DC | PRN

## 2012-08-17 MED ORDER — FAMOTIDINE 40 MG PO TABS
40.0000 mg | ORAL_TABLET | Freq: Every day | ORAL | Status: DC
  Filled 2012-08-17: qty 1

## 2012-08-17 MED ORDER — ONDANSETRON HCL 4 MG/2ML IJ SOLN
4.0000 mg | Freq: Once | INTRAMUSCULAR | Status: AC
  Administered 2012-08-17: 4 mg via INTRAVENOUS
  Filled 2012-08-17: qty 2

## 2012-08-17 MED ORDER — ASPIRIN 325 MG PO TABS
325.0000 mg | ORAL_TABLET | ORAL | Status: DC
  Filled 2012-08-17: qty 1

## 2012-08-17 MED ORDER — SODIUM CHLORIDE 0.9 % IJ SOLN
3.0000 mL | Freq: Two times a day (BID) | INTRAMUSCULAR | Status: DC
  Administered 2012-08-17: 3 mL via INTRAVENOUS

## 2012-08-17 MED ORDER — FUROSEMIDE 40 MG PO TABS
40.0000 mg | ORAL_TABLET | Freq: Every day | ORAL | Status: DC
  Filled 2012-08-17: qty 1

## 2012-08-17 MED ORDER — LORAZEPAM 2 MG/ML IJ SOLN
0.5000 mg | Freq: Once | INTRAMUSCULAR | Status: AC
  Administered 2012-08-17: 0.5 mg via INTRAVENOUS
  Filled 2012-08-17: qty 1

## 2012-08-17 MED ORDER — SODIUM CHLORIDE 0.9 % IV SOLN: INTRAVENOUS | Status: AC

## 2012-08-17 MED ORDER — FENTANYL CITRATE 0.05 MG/ML IJ SOLN: 50.0000 ug | INTRAMUSCULAR | Status: DC | PRN

## 2012-08-17 MED ORDER — SODIUM CHLORIDE 0.9 % IV SOLN
INTRAVENOUS | Status: DC
  Administered 2012-08-17 – 2012-08-18 (×2): via INTRAVENOUS

## 2012-08-17 MED ORDER — ONDANSETRON HCL 4 MG/2ML IJ SOLN
4.0000 mg | Freq: Three times a day (TID) | INTRAMUSCULAR | Status: DC | PRN
  Administered 2012-08-18: 4 mg via INTRAVENOUS
  Filled 2012-08-17: qty 2

## 2012-08-17 MED ORDER — NITROGLYCERIN 0.4 MG SL SUBL
0.4000 mg | SUBLINGUAL_TABLET | SUBLINGUAL | Status: DC | PRN
  Administered 2012-08-17: 0.4 mg via SUBLINGUAL
  Filled 2012-08-17 (×2): qty 25

## 2012-08-17 MED ORDER — TRAVOPROST (BAK FREE) 0.004 % OP SOLN
1.0000 [drp] | Freq: Every day | OPHTHALMIC | Status: DC
  Administered 2012-08-17: 1 [drp] via OPHTHALMIC
  Filled 2012-08-17: qty 2.5

## 2012-08-17 MED ORDER — FAMOTIDINE 40 MG PO TABS: 40.0000 mg | ORAL_TABLET | Freq: Every day | ORAL | Status: DC

## 2012-08-17 MED ORDER — SODIUM CHLORIDE 0.9 % IV BOLUS (SEPSIS)
1000.0000 mL | Freq: Once | INTRAVENOUS | Status: AC
  Administered 2012-08-17: 1000 mL via INTRAVENOUS

## 2012-08-17 NOTE — H&P (Signed)
Hospital Admission Note Date: 08/17/2012  Patient name: Nancy [name] Medical record number: [id number] Date of birth: 01/16/1941 Age: 72 y.o. Gender: female PCP: Gilles Chiquito, MD  Medical Service: [place]  Attending physician: Dr. Tommy Medal  1st Contact: Dr. [name]   Pager:[phone] 2nd Contact: Dr. Nicoletta Dress    Pager:[phone] After 5 pm or weekends: 1st Contact:  Intern on call   Pager: [phone] 2nd Contact:  Resident on call  Pager: [phone]  Chief Complaint: N/V  History of Present Illness: 72 year old female with a past medical history of cocaine abuse, CVA, CKD, and hypertension presents the emergency department with chief complaint of nausea and vomiting. Symptoms began last night after dinner. She reported first episode of nausea and vomiting about 30 minutes postpradially, and symptoms continued throughoutht the night. Emesis is nonbloody, nonbilious and consists of stomach contents. She has had some epigastric and LUQ pain with emesis. She reports pain as severe when she was vomiting earlier, but has improved much with IV pain medication.  Denies diarrhea, hematemesis, hematochezia, melena, fever, or chills. No sick contacts. She denies any chest pain or pressure, SOB, diaphoresis. Of note, she was last admitted to [facility] in early January with similar symptoms. That episode thought to be precipitated by cocaine binge. She also had cardiac work up during that exam, including cycling of troponins and echo. Work up unremarkable.   Meds: Current Outpatient Prescriptions on File Prior to Encounter  Medication Sig Dispense Refill  . amLODipine (NORVASC) 10 MG tablet Take 1 tablet (10 mg total) by mouth daily.  30 tablet  6  . famotidine (PEPCID) 40 MG tablet take 1 tablet by mouth once daily  30 tablet  11  . furosemide (LASIX) 40 MG tablet Take 40 mg by mouth daily.      . Travoprost, BAK Free, (TRAVATAN) 0.004 % SOLN ophthalmic solution Place 1 drop into both eyes at bedtime.         Allergies: Allergies as of 08/17/2012 - Review Complete 08/17/2012  Allergen Reaction Noted  . Hydrocodone-acetaminophen     Past Medical History  Diagnosis Date  . CVA (cerebrovascular accident)     New hemorrhagic per CT scan '09  . Fecal impaction   . Diverticulosis of colon   . Hypertension   . Pulmonary nodule   . Dysfunctional uterine bleeding   . Postmenopausal   . OA (osteoarthritis)     bilateral knees  . Colitis   . Bacterial sinusitis 09/17/2011  . TINEA CRURIS 01/12/2007  . HERNIORRHAPHY, HX OF 08/11/2006  . CKD (chronic kidney disease) stage 4, GFR 15-29 ml/min 08/11/2006    Cr continues to increase. Proteinuria on UA 02/10/12.     Past Surgical History  Procedure Laterality Date  . Inguinal hernia repair  2008  . Abdominal hysterectomy    . Cholecystectomy  2009  . Iridotomy / iridectomy      Laser, right eye 12/26/11 left eye 01/24/12   Family History  Problem Relation Age of Onset  . Hypertension Mother   . Heart attack Mother   . Heart disease Mother    History   Social History  . Marital Status: Married    Spouse Name: N/A    Number of Children: N/A  . Years of Education: N/A   Occupational History  . Not on file.   Social History Main Topics  . Smoking status: Never Smoker   . Smokeless tobacco: Not on file  . Alcohol Use: No  . Drug  Use: No     Comment: 08/15/08 UDS + cocaine  . Sexually Active: Not on file   Other Topics Concern  . Not on file   Social History Narrative   Married, lives with her husband. 1 child.           Review of Systems: 10 pt ROS performed, pertinent positives and negatives noted in HPI  Physical Exam: Blood pressure 151/63, pulse 128, temperature 99.3 F (37.4 C), temperature source Oral, resp. rate 18, SpO2 95.00%. Vitals reviewed. General: resting in bed, sleeping, NAD HEENT: PERRL, EOMI, no scleral icterus, MMM of OP Cardiac: Tachycardic w rate 120, regular rhythm; 2/6 systolic murmur at LUSB Pulm:  clear to auscultation bilaterally, no wheezes, rales, or rhonchi Abd: very mild TTP of LUQ, LQ, and epigastrum. No rebound or guarding. No masses palpated. Normoactive BS. Ext: warm and well perfused, no pedal edema Neuro: alert and oriented X3, cranial nerves II-XII grossly intact, strength and sensation to light touch equal in bilateral upper and lower extremities   Lab results: Basic Metabolic Panel:  Recent Labs  08/17/12 1114  NA 143  K 3.5  CL 100  CO2 27  GLUCOSE 166*  BUN 28*  CREATININE 3.88*  CALCIUM 9.8   Liver Function Tests:  Recent Labs  08/17/12 1150  AST 16  ALT 10  ALKPHOS 81  BILITOT 0.3  PROT 8.2  ALBUMIN 4.1    Recent Labs  08/17/12 1146  LIPASE 34   CBC:  Recent Labs  08/17/12 1114  WBC 9.0  HGB 11.9*  HCT 35.5*  MCV 87.4  PLT 277   Urinalysis:  Recent Labs  08/17/12 1522  COLORURINE YELLOW  LABSPEC 1.014  PHURINE 5.0  GLUCOSEU NEGATIVE  HGBUR TRACE*  [place] >300*  UROBILINOGEN 0.2  NITRITE NEGATIVE  LEUKOCYTESUR NEGATIVE   Imaging results:  Dg Abd Acute W/chest  08/17/2012  *RADIOLOGY REPORT*  Clinical Data: Nausea and vomiting  ACUTE ABDOMEN SERIES (ABDOMEN 2 VIEW & CHEST 1 VIEW)  Comparison: 07/12/2038  Findings: Bowel gas pattern does not show evidence of ileus, obstruction or free air.  There are clips in the right upper quadrant related to previous cholecystectomy.  Calcification in the pelvis relates to a leiomyoma.  Ordinary degenerative changes effect the spine.  One-view chest shows normal heart and mediastinal shadows.  Lungs are clear.  No free air seen under the diaphragm.  IMPRESSION: Negative acute abdominal series.  Previous cholecystectomy. Calcified leiomyoma.   Original Report Authenticated By: Nelson Chimes, M.D.    EKG: Sinus tachycardia w rate 121. Multiple PVCs. ST segment depressions in II, aVF, V5 V6 and borderline depressed in II and V4. Slight ST segment  elevation in V1. Compared to prior 07/13/12, no significant changes.    Assessment & Plan by Problem: 1. Nausea and vomiting Patient has acute onset of nausea and vomiting 24 hours ago, no diarrhea. Also with tachycardia. No hematemesis. No leukocytosis, or evidence of sepsis. Abdominal series radiographs negative for acute obstruction.  I think that her symptoms are most likely side effects of cocaine use. She has had admissions in the past, most recently in early January with symptoms of acute onset nausea, vomiting, and tachycardia as well as ST segment depressions in EKG and found to be UDS + for cocaine. Patient denied use at the time, but her daughter told us on prior admission that patient and husband binge on cocaine at the beginning of the month when  they receive checks in the mail. Differential diagnoses include viral gastroenteritis or a food poisoning. In either of these cases, volume depletion in the setting of vomiting could account for tachycardia, but she does not appear overly dry on exam. Also would expect diarrhea to manifest at this point. Atypical ACS also a possibility (see#2) - repeat UDS - IVF 50cc/hr x10hrs. Patient does not appear volume depleted, do not want to overload in setting of GFR 13 - zofran for nausea - social work consult for cocaine cessation   2. ST segment depressions on EKG Patient with inferolateral ST segment depressions in II, aVF, V5 V6 and borderline depressed in II and V4. No significant change from prior in early January. Suspect cocaine-induced changes. Initial troponin negative, patient denies any active chest pain or pressure. During her last admission, was evaluated by cardiology with echo which showed normal EF and no wall motion abnormalities. - Cycle CE, negative x1 so far - Telemetry - Hold BB as suspected cocaine use   3. Hypertension:  At home, she takes furosemide 40 mg BID and amlodipine 10mg . BP 151/63 here, says she vomited her meds this  morning.  - Restart home antihypertensives   4. Stage IV CKD:  Her GFR is 13 w CR 3.8, baseline around 3.4. She is followed by Dr. Mercy Moore at [facility] where discussions have been had about the possibility of dialysis. She maintains a position against dialysis, but Dr. Mercy Moore feels she will decide to go on dialysis once the time comes. She takes furosemide 40 mg twice a day at home.  - restart lasix tomorrow morning   5. GERD:  At home, she takes famotidine 40 mg daily. Continued while inpatient.   6. DVT prophy: Heparin TID  Dispo: Disposition is deferred at this time, awaiting improvement of current medical problems. Anticipated discharge in approximately 1 day(s).   The patient does have a current PCP (MULLEN, EMILY, MD), therefore will be requiring OPC follow-up after discharge.   The patient does not have transportation limitations that hinder transportation to clinic appointments.  SignedTonia Brooms 08/17/2012, 7:43 PM

## 2012-08-17 NOTE — ED Notes (Signed)
Pt returned from radiology.

## 2012-08-17 NOTE — ED Notes (Signed)
PA at bedside.

## 2012-08-17 NOTE — ED Provider Notes (Signed)
History     CSN: [id number]  Arrival date & time 08/17/12  1058   First MD Initiated Contact with Patient 08/17/12 1135      Chief Complaint  Patient presents with  . Chest Pain    (Consider location/radiation/quality/duration/timing/severity/associated sxs/prior treatment) The history is provided by the patient and medical records. No language interpreter was used.   72 year old female with a past medical history of CVA, CKD, and hypertension presents the emergency department with chief complaint of nausea and vomiting.  Patient states that she ate spaghetti and meatballs last night.  Approximately one half hour after eating she began having acute nausea and vomiting.  Patient states that she was sick all night long with multiple episodes of nausea and vomiting.  She denies any diarrhea.  She denies fever.  Patient has chest pain with vomiting.  She denies any chest pressure, radiation to the left arm shoulder or jaw.  She complains of continued nausea is actively vomiting.  She denies any blood in her vomit.She denies abdominal pain  Past Medical History  Diagnosis Date  . CVA (cerebrovascular accident)     New hemorrhagic per CT scan '09  . Fecal impaction   . Diverticulosis of colon   . Hypertension   . Pulmonary nodule   . Dysfunctional uterine bleeding   . Postmenopausal   . OA (osteoarthritis)     bilateral knees  . Colitis   . Bacterial sinusitis 09/17/2011  . TINEA CRURIS 01/12/2007  . HERNIORRHAPHY, HX OF 08/11/2006  . CKD (chronic kidney disease) stage 4, GFR 15-29 ml/min 08/11/2006    Cr continues to increase. Proteinuria on UA 02/10/12.      Past Surgical History  Procedure Laterality Date  . Inguinal hernia repair  2008  . Abdominal hysterectomy    . Cholecystectomy  2009  . Iridotomy / iridectomy      Laser, right eye 12/26/11 left eye 01/24/12    Family History  Problem Relation Age of Onset  . Hypertension Mother   . Heart attack Mother   . Heart disease  Mother     History  Substance Use Topics  . Smoking status: Never Smoker   . Smokeless tobacco: Not on file  . Alcohol Use: No    OB History   Grav Para Term Preterm Abortions TAB SAB Ect Mult Living                  Review of Systems Ten systems reviewed and are negative for acute change, except as noted in the HPI.   Allergies  Hydrocodone-acetaminophen  Home Medications   Current Outpatient Rx  Name  Route  Sig  Dispense  Refill  . amLODipine (NORVASC) 10 MG tablet   Oral   Take 1 tablet (10 mg total) by mouth daily.   30 tablet   6   . atorvastatin (LIPITOR) 80 MG tablet   Oral   Take 1 tablet (80 mg total) by mouth daily.   30 tablet   0   . cloNIDine (CATAPRES) 0.2 MG tablet   Oral   Take 1 tablet (0.2 mg total) by mouth 2 (two) times daily.   60 tablet   11   . famotidine (PEPCID) 40 MG tablet      take 1 tablet by mouth once daily   30 tablet   11   . furosemide (LASIX) 40 MG tablet   Oral   Take 40 mg by mouth daily.         Marland Kitchen  ondansetron (ZOFRAN) 4 MG tablet   Oral   Take 1 tablet (4 mg total) by mouth every 4 (four) hours as needed for nausea.   12 tablet   0   . ondansetron (ZOFRAN-ODT) 4 MG disintegrating tablet   Oral   Take 4 mg by mouth every 4 (four) hours as needed. For nausea         . Travoprost, BAK Free, (TRAVATAN) 0.004 % SOLN ophthalmic solution   Both Eyes   Place 1 drop into both eyes at bedtime.           BP 167/82  Pulse 122  Temp(Src) 98.6 F (37 C) (Oral)  SpO2 95%  Physical Exam Physical Exam  Nursing note and vitals reviewed. Constitutional: She is oriented to person, place, and time. She is curled up in fetal position and appears uncomfortable.  HENT:  Head: Normocephalic and atraumatic.  Eyes: Conjunctivae normal and EOM are normal. Pupils are equal, round, and reactive to light. No scleral icterus.  Neck: Normal range of motion.  Cardiovascular: tachycardic, regular rhythm and normal heart  sounds.  Exam reveals no gallop and no friction rub.   No murmur heard. Pulmonary/Chest: Effort normal and breath sounds normal. No respiratory distress.  Abdominal: Soft. Bowel sounds are normal. Mild epigastric tenderness. Neurological: She is alert and oriented to person, place, and time.  Skin: Skin is warm and dry. She is not diaphoretic.    ED Course  Procedures (including critical care time)  Labs Reviewed  CBC - Abnormal; Notable for the following:    Hemoglobin 11.9 (*)    HCT 35.5 (*)    All other components within normal limits  BASIC METABOLIC PANEL - Abnormal; Notable for the following:    Glucose, Bld 166 (*)    BUN 28 (*)    Creatinine, Ser 3.88 (*)    GFR calc non Af Amer 11 (*)    GFR calc Af Amer 13 (*)    All other components within normal limits  URINALYSIS, ROUTINE W REFLEX MICROSCOPIC  LIPASE, BLOOD  HEPATIC FUNCTION PANEL  POCT I-STAT TROPONIN I   No results found.   Date: 08/17/2012  Rate: 121 Rhythm: sinus tachycardia  QRS Axis: normal  Intervals: normal  ST/T Wave abnormalities: nonspecific ST changes  Conduction Disutrbances:none  Narrative Interpretation: abnormal ecg with ST seg depression Leads II, V4-6. multpile PVCs  Old EKG Reviewed: unchanged   No diagnosis found.    MDM  12:30 PM Patient with N/V after eating.  Labs are currently pending.  Will evaluate for ACS.  2:17 PM Filed Vitals:   08/17/12 1115 08/17/12 1215 08/17/12 1315 08/17/12 1401  BP: 143/84 134/83 156/65 169/70  Pulse: 121 124 111   Temp:      TempSrc:      Resp: 27 19 16    SpO2: 94% 98% 92%    Labs are unremarkable.  Patient with CKD and slight elevation in her creatinine which is likely due to dehydraiton.  She continues to complain of nausea.  No vomiting.   Filed Vitals:   08/17/12 1633 08/17/12 1704 08/17/12 1725 08/17/12 1928  BP: 156/71 155/66 150/69 151/63  Pulse: 130 130 135 128  Temp:    99.3 F (37.4 C)  TempSrc:    Oral  Resp: 18 18 20 18    SpO2: 96% 94% 94% 95%   Paitent with continued n/v/tachycardia.  Continued tachycardia. Troponin negative x2.  Patient with admission and workup for similar episode in  early January 2014.I have spoken with internal medicine teaching service who has agreed to admit the patient for dehydration n/v/ and tachycardia.    Margarita Mail, PA-C 08/17/12 2150

## 2012-08-17 NOTE — ED Notes (Signed)
Pt transported to radiology.

## 2012-08-17 NOTE — ED Provider Notes (Signed)
72yo F, c/o multiple episodes of N/V since yesterday.  Has been associated with LUQ abd "pain" described as "sharp" and "cramping."  Unable to take PO due to N/V.  Denies diarrhea, no black or blood in stools or emesis, denies CP/SOB, no back pain.  SBP stable, tachycardia, CTA, +mild LUQ TTP, no rebound or guarding.  BUN/Cr elevated, slightly higher than baseline. Persistent tachycardia despite IVF, pain and nausea meds.  Will admit.      Alfonzo Feller, DO 08/17/12 1651

## 2012-08-17 NOTE — ED Notes (Addendum)
Pt with nausea, vomiting since last night. C/o epigastric abdominal pain and chest pain described as sharp. Neg diaphoresis, SOB, or radiation of pain. EMS EKG showed ST with [place] inversion.

## 2012-08-18 ENCOUNTER — Observation Stay (HOSPITAL_COMMUNITY): Payer: PRIVATE HEALTH INSURANCE

## 2012-08-18 ENCOUNTER — Encounter (HOSPITAL_COMMUNITY): Payer: Self-pay | Admitting: General Practice

## 2012-08-18 DIAGNOSIS — R112 Nausea with vomiting, unspecified: Secondary | ICD-10-CM

## 2012-08-18 DIAGNOSIS — K5289 Other specified noninfective gastroenteritis and colitis: Secondary | ICD-10-CM

## 2012-08-18 DIAGNOSIS — K219 Gastro-esophageal reflux disease without esophagitis: Secondary | ICD-10-CM

## 2012-08-18 DIAGNOSIS — R079 Chest pain, unspecified: Secondary | ICD-10-CM

## 2012-08-18 DIAGNOSIS — K5730 Diverticulosis of large intestine without perforation or abscess without bleeding: Secondary | ICD-10-CM

## 2012-08-18 DIAGNOSIS — E785 Hyperlipidemia, unspecified: Secondary | ICD-10-CM

## 2012-08-18 DIAGNOSIS — R9431 Abnormal electrocardiogram [ECG] [EKG]: Secondary | ICD-10-CM

## 2012-08-18 DIAGNOSIS — R109 Unspecified abdominal pain: Secondary | ICD-10-CM

## 2012-08-18 DIAGNOSIS — R000 Tachycardia, unspecified: Secondary | ICD-10-CM

## 2012-08-18 DIAGNOSIS — F1410 Cocaine abuse, uncomplicated: Secondary | ICD-10-CM

## 2012-08-18 DIAGNOSIS — N184 Chronic kidney disease, stage 4 (severe): Secondary | ICD-10-CM

## 2012-08-18 LAB — CBC
HCT: 33.6 % — ABNORMAL LOW (ref 36.0–46.0)
Hemoglobin: 11.2 g/dL — ABNORMAL LOW (ref 12.0–15.0)
MCV: 89.1 fL (ref 78.0–100.0)
RDW: 13.8 % (ref 11.5–15.5)
WBC: 11.4 10*3/uL — ABNORMAL HIGH (ref 4.0–10.5)

## 2012-08-18 LAB — BASIC METABOLIC PANEL
CO2: 25 mEq/L (ref 19–32)
Chloride: 103 mEq/L (ref 96–112)
Creatinine, Ser: 3.94 mg/dL — ABNORMAL HIGH (ref 0.50–1.10)
GFR calc Af Amer: 12 mL/min — ABNORMAL LOW (ref >90)
Potassium: 3.5 mEq/L (ref 3.5–5.1)

## 2012-08-18 LAB — HIV ANTIBODY (ROUTINE TESTING W REFLEX): HIV: NONREACTIVE

## 2012-08-18 MED ORDER — ONDANSETRON HCL 4 MG PO TABS: 4.0000 mg | ORAL_TABLET | Freq: Two times a day (BID) | ORAL | Status: DC | PRN

## 2012-08-18 MED ORDER — PROMETHAZINE HCL 25 MG/ML IJ SOLN
12.5000 mg | Freq: Once | INTRAMUSCULAR | Status: AC
  Administered 2012-08-18: 12.5 mg via INTRAVENOUS
  Filled 2012-08-18 (×2): qty 1

## 2012-08-18 MED ORDER — PROMETHAZINE HCL 25 MG/ML IJ SOLN: 12.5000 mg | Freq: Four times a day (QID) | INTRAMUSCULAR | Status: DC | PRN

## 2012-08-18 MED ORDER — ONDANSETRON HCL 4 MG/2ML IJ SOLN
4.0000 mg | Freq: Three times a day (TID) | INTRAMUSCULAR | Status: DC | PRN
  Administered 2012-08-18: 4 mg via INTRAVENOUS
  Filled 2012-08-18: qty 2

## 2012-08-18 NOTE — H&P (Signed)
Internal Medicine Teaching Service Attending Note Date: 08/18/2012  Patient name: Nancy [name]  Medical record number: [id number]  Date of birth: 10-05-1940    This patient has been seen and discussed with the house staff. Please see their note for complete details. I concur with their findings with the following additions/corrections:  Patient still c/o diffuse abdominal pain this am. She had recent CT scan without acute intrabdominal pathology only diverticulosis. I am not wanting to expose her to excess radation. Will check [place]. Suspect she should get bettter with symptomatic care.  Check HIV  Alcide Evener 08/18/2012, 10:37 AM
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
## 2012-08-18 NOTE — Care Management Note (Unsigned)
    Page 1 of 1   08/18/2012     3:42:17 PM   CARE MANAGEMENT NOTE 08/18/2012  Patient:  Nancy [name], Nancy [name]   Account Number:  [id number]  Date Initiated:  08/18/2012  Documentation initiated by:  Dannell Gortney  Subjective/Objective Assessment:   PT ADM WITH NAUSEA AND VOMITING, POS UDS FOR COCAINE.  PTA, PT INDEPENDENT, LIVES WITH SPOUSE.     Action/Plan:   WILL FOLLOW FOR HOME NEEDS AS PT PROGRESSES.  CSW CONSULT FOR SUBSTANCE ABUSE COUNSELING.   Anticipated DC Date:  08/19/2012   Anticipated DC Plan:  HOME/SELF CARE  In-house referral  Clinical Social Worker      DC Planning Services  CM consult      Choice offered to / List presented to:             Status of service:  In process, will continue to follow Medicare Important Message given?   (If response is "NO", the following Medicare IM given date fields will be blank) Date Medicare IM given:   Date Additional Medicare IM given:    Discharge Disposition:    Per UR Regulation:  Reviewed for med. necessity/level of care/duration of stay  If discussed at [place] of Stay Meetings, dates discussed:    Comments:

## 2012-08-18 NOTE — Progress Notes (Signed)
Subjective: Nancy [name] was seen and examined in bed this morning.  She reports vomiting again this morning and nausea now controlled with medication.  She continues to have epigastric pain, worse with movement, but denies any chest pain, shortness of breath, diarrhea, fever, chills, or any urinary complaints at this time.  She also denies recent cocaine use, but UDS on admission was positive for cocaine.    Objective: Vital signs in last 24 hours: Filed Vitals:   08/17/12 2352 08/17/12 2355 08/17/12 2358 08/18/12 0514  BP: 155/63 158/82 111/69 149/82  Pulse: 129 128 139 111  Temp: 98.6 F (37 C)   98.2 F (36.8 C)  TempSrc: Oral   Oral  Resp: 20   20  SpO2: 96%   96%   Weight change:  No intake or output data in the 24 hours ending 08/18/12 [id number] Vitals reviewed. General: resting in bed, NAD HEENT: PERRLA, EOMI, no scleral icterus Cardiac: Tachycardia, + 2/6 SEM LUSB Pulm: clear to auscultation bilaterally, no wheezes, rales, or rhonchi Abd: soft, tenderness to deep palpation epigastric and left region, nondistended, BS present, no rebound or guarding.   Ext: warm and well perfused, no pedal edema, +2dp b/l Neuro: alert and oriented X3, cranial nerves II-XII grossly intact, strength and sensation to light touch equal in bilateral upper and lower extremities  Lab Results: Basic Metabolic Panel:  Recent Labs Lab 08/17/12 1114 08/18/12 0440  NA 143 144  K 3.5 3.5  CL 100 103  CO2 27 25  GLUCOSE 166* 129*  BUN 28* 33*  CREATININE 3.88* 3.94*  CALCIUM 9.8 9.4   Liver Function Tests:  Recent Labs Lab 08/17/12 1150  AST 16  ALT 10  ALKPHOS 81  BILITOT 0.3  PROT 8.2  ALBUMIN 4.1    Recent Labs Lab 08/17/12 1146  LIPASE 34   CBC:  Recent Labs Lab 08/17/12 1114 08/18/12 0440  WBC 9.0 11.4*  HGB 11.9* 11.2*  HCT 35.5* 33.6*  MCV 87.4 89.1  PLT 277 275   Cardiac Enzymes:  Recent Labs Lab 08/17/12 2230  TROPONINI <0.30   Urine Drug Screen: Drugs of  Abuse     Component Value Date/Time   LABOPIA NONE DETECTED 08/17/2012 1522   COCAINSCRNUR POSITIVE* 08/17/2012 1522   COCAINSCRNUR NEG 09/21/2009 2031   LABBENZ NONE DETECTED 08/17/2012 1522   LABBENZ NEG 09/21/2009 2031   AMPHETMU NONE DETECTED 08/17/2012 1522   AMPHETMU NEG 09/21/2009 2031   THCU NONE DETECTED 08/17/2012 1522   LABBARB NONE DETECTED 08/17/2012 1522    Urinalysis:  Recent Labs Lab 08/17/12 1522  COLORURINE YELLOW  LABSPEC 1.014  PHURINE 5.0  GLUCOSEU NEGATIVE  HGBUR TRACE*  BILIRUBINUR NEGATIVE  KETONESUR NEGATIVE  PROTEINUR >300*  UROBILINOGEN 0.2  NITRITE NEGATIVE  LEUKOCYTESUR NEGATIVE   Studies/Results: Dg Abd Acute W/chest  08/17/2012  *RADIOLOGY REPORT*  Clinical Data: Nausea and vomiting  ACUTE ABDOMEN SERIES (ABDOMEN 2 VIEW & CHEST 1 VIEW)  Comparison: 07/12/2038  Findings: Bowel gas pattern does not show evidence of ileus, obstruction or free air.  There are clips in the right upper quadrant related to previous cholecystectomy.  Calcification in the pelvis relates to a leiomyoma.  Ordinary degenerative changes effect the spine.  One-view chest shows normal heart and mediastinal shadows.  Lungs are clear.  No free air seen under the diaphragm.  IMPRESSION: Negative acute abdominal series.  Previous cholecystectomy. Calcified leiomyoma.   Original Report Authenticated By: Nelson Chimes, M.D.    Medications: I have  reviewed the patient's current medications. Scheduled Meds: . sodium chloride   Intravenous STAT  . amLODipine  10 mg Oral Daily  . famotidine  40 mg Oral Daily  . furosemide  40 mg Oral Daily  . heparin  5,000 Units Subcutaneous Q8H  . sodium chloride  3 mL Intravenous Q12H  . Travoprost (BAK Free)  1 drop Both Eyes QHS   Continuous Infusions: . sodium chloride 50 mL/hr at 08/18/12 0638   PRN Meds:.fentaNYL, ondansetron (ZOFRAN) IV Assessment/Plan: Nancy [name] is a 72 year old female with PMH cocaine abuse, CVA, CKD stage IV, and HTN admitted  for nausea, vomiting, and abdominal.    1. Nausea and vomiting--likely secondary to cocaine side-effects, although she denies recent use, but UDS on admission was positive.  Could also be viral gastroenteritis, however, no diarrhea at this time.  Presented with acute onset of nausea and vomiting 24 hours ago after eating spaghetti with garlic at home, no diarrhea but persistent tachycardia. No hematemesis. No leukocytosis, or evidence of sepsis.  Abdominal series radiographs negative for acute obstruction, calcified leiomyoma also on prior CT scan from January 2014 and diverticulosis. She has had similar admissions in the past, most recently in early January with symptoms of acute onset nausea, vomiting, and tachycardia as well as ST segment depressions in EKG and found to be UDS + for cocaine.  Daughter confirmed on admission that patient and husband binge on cocaine at the beginning of the month when they receive checks in the mail.  - UDS positive for cocaine on 2/10 - IVF 50cc/hr x10hrs. Caution with GFR 13  - zofran for nausea prn - social work consult for cocaine cessation - f/u abdominal ultrasound   2. ST segment depressions on EKG-- inferolateral ST segment depressions in II, aVF, V5 V6 and borderline depressed in II and V4.  No significant change from prior in early January. Suspect cocaine-induced changes.Troponin x3 negative and denies any active chest pain or pressure.  During her last admission, was evaluated by cardiology with echo which showed normal EF and no wall motion abnormalities.  - Telemetry  - Initially held BB as suspected cocaine use--likely resume on discharge   3. Hypertension: At home, she takes furosemide 40 mg BID and amlodipine 10mg . BP 151/63 here, says she vomited her meds this morning.  - continue home antihypertensives   4. Stage IV CKD:GFR is 13 with Cr 3.8 on admission, baseline around 3.4. She is followed by Dr. Mercy Moore at [facility] where discussions  have been had about the possibility of dialysis. She maintains a position against dialysis, but Dr. Mercy Moore feels she will decide to go on dialysis once the time comes. She takes furosemide 40 mg twice a day at home.  - restart lasix today  5. GERD:  At home, she takes famotidine 40 mg daily.  Continued while inpatient.   6. DVT prophy: Heparin TID   Dispo: likely d/c home today  The patient does have a current PCP (Nancy [name], EMILY, MD), therefore will be requiring OPC follow-up after discharge.  The patient does not have transportation limitations that hinder transportation to clinic appointments.  Services Needed at time of discharge: Y = Yes, Blank = No PT:   OT:   RN:   Equipment:   Other:     LOS: 1 day   Jerene Pitch 08/18/2012, 8:08 AM

## 2012-08-19 NOTE — ED Provider Notes (Signed)
Medical screening examination/treatment/procedure(s) were conducted as a shared visit with non-physician practitioner(s) and myself.  I personally evaluated the patient during the encounter. Please see my previous note.  Alfonzo Feller, DO 08/19/12 [phone]

## 2012-08-21 NOTE — Discharge Summary (Signed)
Internal [facility] Discharge Note  Name: Nancy [name] MRN: [id number] DOB: [birth year] 72 y.o.  Date of Admission: 08/17/2012 10:58 AM Date of Discharge: 08/21/2012 Attending Physician: Dr. Tommy Medal Discharge Diagnosis: Principal Problem:   Nausea with vomiting  Discharge Medications:   Medication List    TAKE these medications       amLODipine 10 MG tablet  Commonly known as:  NORVASC  Take 1 tablet (10 mg total) by mouth daily.     famotidine 40 MG tablet  Commonly known as:  PEPCID  take 1 tablet by mouth once daily     furosemide 40 MG tablet  Commonly known as:  LASIX  Take 40 mg by mouth daily.     ondansetron 4 MG tablet  Commonly known as:  ZOFRAN  Take 1 tablet (4 mg total) by mouth every 12 (twelve) hours as needed for nausea.     Travoprost (BAK Free) 0.004 % Soln ophthalmic solution  Commonly known as:  TRAVATAN  Place 1 drop into both eyes at bedtime.       Disposition and follow-up:   [name] was discharged from [facility] in Stable condition.  At the hospital follow up visit please address: N/V/abdominal pain--cocaine positive.  Resumed during admission.  Cocaine abuse--denies use, but UDS positive on admisison.    HTN--on amlodipine and lasix at home, may need to adjust doses.    Follow-up Appointments:     Follow-up Information   Follow up with SAWHNEY,MEGHA, MD On 09/01/2012. (@10am )    Contact information:   [place] [address] [phone]      Discharge Orders   Future Appointments Provider Department Dept Phone   09/01/2012 10:00 AM Pedro Earls, MD [place] [phone]   Future Orders Complete By Expires     Call MD for:  difficulty breathing, headache or visual disturbances  As directed     Call MD for:  persistant nausea and vomiting  As directed     Call MD for:  severe uncontrolled pain  As directed     Diet - low sodium heart  healthy  As directed     Increase activity slowly  As directed       Procedures Performed:  US Abdomen Complete  08/18/2012  *RADIOLOGY REPORT*  Clinical Data:  Abdominal pain, hypertension, kidney disease, previous cholecystectomy.  COMPLETE ABDOMINAL ULTRASOUND  Comparison:  CT 07/13/2012 and earlier studies  Findings:  Gallbladder:  Surgically absent  Common bile duct:  7.5 mm diameter, unremarkable.  Liver:  Somewhat coarse and inhomogeneous in echotexture without focal lesion or intrahepatic biliary ductal dilatation evident. Antegrade flow in the portal vein.  IVC:  Unremarkable, segments obscured by overlying bowel gas.  Pancreas:  Largely obscured by overlying bowel gas.  Spleen:  6 cm craniocaudal length, unremarkable.  Right Kidney:  Echogenic parenchyma.  11 cm in length.  No focal renal lesion or hydronephrosis.  Left Kidney:  Echogenic, 9.3 cm in length.  No focal lesion or hydronephrosis.  Abdominal aorta:  Unremarkable,   segments obscured by overlying bowel gas.  IMPRESSION:  1.  No acute abdominal process post cholecystectomy, with limitations as above. 2.  Echogenic renal parenchyma, a nonspecific indicator of medical renal disease.   Original Report Authenticated By: D. Wallace Going, MD    Dg Abd Acute W/chest  08/17/2012  *RADIOLOGY REPORT*  Clinical Data: Nausea and vomiting  ACUTE ABDOMEN SERIES (  ABDOMEN 2 VIEW & CHEST 1 VIEW)  Comparison: 07/12/2038  Findings: Bowel gas pattern does not show evidence of ileus, obstruction or free air.  There are clips in the right upper quadrant related to previous cholecystectomy.  Calcification in the pelvis relates to a leiomyoma.  Ordinary degenerative changes effect the spine.  One-view chest shows normal heart and mediastinal shadows.  Lungs are clear.  No free air seen under the diaphragm.  IMPRESSION: Negative acute abdominal series.  Previous cholecystectomy. Calcified leiomyoma.   Original Report Authenticated By: Nelson Chimes, M.D.     Admission HPI: 72 year old female with a past medical history of cocaine abuse, CVA, CKD, and hypertension presents the emergency department with chief complaint of nausea and vomiting.  Symptoms began last night after dinner. She reported first episode of nausea and vomiting about 30 minutes postpradially, and symptoms continued throughoutht the night. Emesis is nonbloody, nonbilious and consists of stomach contents. She has had some epigastric and LUQ pain with emesis. She reports pain as severe when she was vomiting earlier, but has improved much with IV pain medication.  Denies diarrhea, hematemesis, hematochezia, melena, fever, or chills. No sick contacts.  She denies any chest pain or pressure, SOB, diaphoresis.  Of note, she was last admitted to [facility] in early January with similar symptoms. That episode thought to be precipitated by cocaine binge. She also had cardiac work up during that exam, including cycling of troponins and echo. Work up unremarkable.   Hospital Course by problem list:   Nausea with vomiting--likely secondary to cocaine side-effects, although she denies recent use, but UDS on admission was positive. Could also be viral gastroenteritis, however, no diarrhea. Presented with acute onset of nausea and vomiting 24 hours ago after eating spaghetti with garlic at home, no diarrhea but persistent tachycardia. No hematemesis. No leukocytosis, or evidence of sepsis. Abdominal series radiographs negative for acute obstruction, calcified leiomyoma also on prior CT scan from January 2014 and diverticulosis. She has had similar admissions in the past, most recently in early January with symptoms of acute onset nausea, vomiting, and tachycardia as well as ST segment depressions in EKG and found to be UDS + for cocaine. Nausea and vomiting resolved during hospital course, was given PRN zofran, and tolerating diet.  Initial symptoms were also associated with generalized abdominal pain but no acute  abdominal process per ultrasound.  Will need to follow up with pcp.    ST segment depressions on EKG--Inferolateral ST segment depressions in II, aVF, V5 V6 and borderline depressed in II and V4. No significant change from prior in early January. Suspect cocaine-induced changes.Troponin x3 negative and denied any active chest pain or pressure.  During her last admission, was evaluated by cardiology with echo which showed normal EF and no wall motion abnormalities.  Initially held BB as suspected cocaine use, resumed on discharge.  Follow up with pcp.    Hypertension--At home, she takes furosemide 40 mg BID and amlodipine 10mg . BP 151/63 on admission, says she vomited her medications day of admission.  Continued on home antihypertensives during hospital course and on discharge.  Follow up with pcp and adjust medications as needed.    Stage IV CKD: GFR is 13 with Cr 3.8 on admission, baseline around 3.4. She is followed by Dr. Mercy Moore at [facility] where discussions have been had about the possibility of dialysis. She maintains a position against dialysis, but Dr. Mercy Moore feels she will decide to go on dialysis once the time comes. She  takes furosemide 40 mg twice a day at home. Follow up pcp and renal.    GERD--takes famotidine 40 mg daily at home. Continued while inpatient and on discharge.    Cocaine abuse--denied use, but UDS positive for cocaine on admission.  Counseling given and cessation strongly advised.  Daughter confirmed on admission that patient and husband binge on cocaine at the beginning of the month when they receive checks in the mail.   Discharge Vitals:  BP 161/70  Pulse 92  Temp(Src) 98.4 F (36.9 C) (Oral)  Resp 18  SpO2 95%  Discharge Labs:  Basic Metabolic Panel:  Recent Labs Lab 08/17/12 1114 08/18/12 0440  NA 143 144  K 3.5 3.5  CL 100 103  CO2 27 25  GLUCOSE 166* 129*  BUN 28* 33*  CREATININE 3.88* 3.94*  CALCIUM 9.8 9.4   Liver Function  Tests:  Recent Labs Lab 08/17/12 1150  AST 16  ALT 10  ALKPHOS 81  BILITOT 0.3  PROT 8.2  ALBUMIN 4.1    Recent Labs Lab 08/17/12 1146  LIPASE 34   CBC:  Recent Labs Lab 08/17/12 1114 08/18/12 0440  WBC 9.0 11.4*  HGB 11.9* 11.2*  HCT 35.5* 33.6*  MCV 87.4 89.1  PLT 277 275   Cardiac Enzymes:  Recent Labs Lab 08/17/12 2230  TROPONINI <0.30   Urine Drug Screen: Drugs of Abuse     Component Value Date/Time   LABOPIA NONE DETECTED 08/17/2012 1522   COCAINSCRNUR POSITIVE* 08/17/2012 1522   COCAINSCRNUR NEG 09/21/2009 2031   LABBENZ NONE DETECTED 08/17/2012 1522   LABBENZ NEG 09/21/2009 2031   AMPHETMU NONE DETECTED 08/17/2012 1522   AMPHETMU NEG 09/21/2009 2031   THCU NONE DETECTED 08/17/2012 1522   LABBARB NONE DETECTED 08/17/2012 1522    Urinalysis:  Recent Labs Lab 08/17/12 1522  COLORURINE YELLOW  LABSPEC 1.014  PHURINE 5.0  GLUCOSEU NEGATIVE  HGBUR TRACE*  BILIRUBINUR NEGATIVE  KETONESUR NEGATIVE  PROTEINUR >300*  UROBILINOGEN 0.2  NITRITE NEGATIVE  LEUKOCYTESUR NEGATIVE   Signed: Jerene Pitch 08/21/2012, 3:51 PM   Time Spent on Discharge: 35 minutes Services Ordered on Discharge: none Equipment Ordered on Discharge: none

## 2012-09-01 ENCOUNTER — Ambulatory Visit: Payer: PRIVATE HEALTH INSURANCE | Admitting: Internal Medicine

## 2012-09-07 ENCOUNTER — Ambulatory Visit: Payer: PRIVATE HEALTH INSURANCE | Admitting: Internal Medicine

## 2012-09-10 ENCOUNTER — Ambulatory Visit: Payer: PRIVATE HEALTH INSURANCE | Admitting: Internal Medicine

## 2012-12-02 ENCOUNTER — Other Ambulatory Visit: Payer: Self-pay | Admitting: *Deleted

## 2012-12-02 DIAGNOSIS — Z01810 Encounter for preprocedural cardiovascular examination: Secondary | ICD-10-CM

## 2012-12-02 DIAGNOSIS — N184 Chronic kidney disease, stage 4 (severe): Secondary | ICD-10-CM

## 2012-12-22 ENCOUNTER — Encounter: Payer: Self-pay | Admitting: Vascular Surgery

## 2012-12-23 ENCOUNTER — Ambulatory Visit: Payer: PRIVATE HEALTH INSURANCE | Admitting: Vascular Surgery

## 2012-12-23 ENCOUNTER — Encounter: Payer: Self-pay | Admitting: *Deleted

## 2012-12-23 NOTE — Progress Notes (Signed)
Patient ID: Nancy [name], female   DOB: Jul 14, 1940, 72 y.o.   MRN: [id number]  Nancy [name] arrived at [facility] for her appointment regarding arteriovenous fistula placement.  She was to have an ultrasound vein mapping of her arms and then see Dr. Scot Dock.  When I got her into the ultrasound exam room and explained the procedure, she refused stating she did not want to undergo dialysis.  She was under the impression she was here for a varicose vein consultation for her legs and requested I call her referring physician's office.  I called Dr. Etheleen Nicks office at [facility] and spoke with [place].  Cherice verified that Nancy [name] was here for a fistula consult.  The patient reiterated that she did not want to undergo dialysis and that if she had known that's what this appointment was for she wouldn't have come.  I let Cherice know that the patient refused the exam and consult, she said she would let Dr. Mercy Moore know.   Melody Haver, RVT

## 2013-02-04 ENCOUNTER — Encounter (HOSPITAL_COMMUNITY): Payer: Self-pay | Admitting: Emergency Medicine

## 2013-02-04 ENCOUNTER — Emergency Department (HOSPITAL_COMMUNITY): Payer: PRIVATE HEALTH INSURANCE

## 2013-02-04 ENCOUNTER — Observation Stay (HOSPITAL_COMMUNITY)
Admission: EM | Admit: 2013-02-04 | Discharge: 2013-02-06 | Disposition: A | Discharge status: Home / Self Care | Payer: PRIVATE HEALTH INSURANCE | Attending: Internal Medicine | Admitting: Internal Medicine

## 2013-02-04 DIAGNOSIS — I10 Essential (primary) hypertension: Secondary | ICD-10-CM

## 2013-02-04 DIAGNOSIS — F1910 Other psychoactive substance abuse, uncomplicated: Secondary | ICD-10-CM | POA: Insufficient documentation

## 2013-02-04 DIAGNOSIS — E785 Hyperlipidemia, unspecified: Secondary | ICD-10-CM | POA: Insufficient documentation

## 2013-02-04 DIAGNOSIS — R0789 Other chest pain: Secondary | ICD-10-CM | POA: Diagnosis present

## 2013-02-04 DIAGNOSIS — Z91199 Patient's noncompliance with other medical treatment and regimen due to unspecified reason: Secondary | ICD-10-CM | POA: Insufficient documentation

## 2013-02-04 DIAGNOSIS — Z9119 Patient's noncompliance with other medical treatment and regimen: Secondary | ICD-10-CM | POA: Insufficient documentation

## 2013-02-04 DIAGNOSIS — R0602 Shortness of breath: Secondary | ICD-10-CM | POA: Insufficient documentation

## 2013-02-04 DIAGNOSIS — N185 Chronic kidney disease, stage 5: Secondary | ICD-10-CM

## 2013-02-04 DIAGNOSIS — N186 End stage renal disease: Secondary | ICD-10-CM | POA: Diagnosis present

## 2013-02-04 DIAGNOSIS — K219 Gastro-esophageal reflux disease without esophagitis: Secondary | ICD-10-CM | POA: Insufficient documentation

## 2013-02-04 DIAGNOSIS — R011 Cardiac murmur, unspecified: Secondary | ICD-10-CM | POA: Insufficient documentation

## 2013-02-04 DIAGNOSIS — Z8673 Personal history of transient ischemic attack (TIA), and cerebral infarction without residual deficits: Secondary | ICD-10-CM | POA: Insufficient documentation

## 2013-02-04 DIAGNOSIS — R825 Elevated urine levels of drugs, medicaments and biological substances: Secondary | ICD-10-CM

## 2013-02-04 DIAGNOSIS — R072 Precordial pain: Principal | ICD-10-CM | POA: Insufficient documentation

## 2013-02-04 DIAGNOSIS — R079 Chest pain, unspecified: Secondary | ICD-10-CM

## 2013-02-04 DIAGNOSIS — I498 Other specified cardiac arrhythmias: Secondary | ICD-10-CM | POA: Insufficient documentation

## 2013-02-04 DIAGNOSIS — I120 Hypertensive chronic kidney disease with stage 5 chronic kidney disease or end stage renal disease: Secondary | ICD-10-CM | POA: Insufficient documentation

## 2013-02-04 LAB — URINE MICROSCOPIC-ADD ON

## 2013-02-04 LAB — COMPREHENSIVE METABOLIC PANEL
Albumin: 3.9 g/dL (ref 3.5–5.2)
BUN: 31 mg/dL — ABNORMAL HIGH (ref 6–23)
CO2: 22 mEq/L (ref 19–32)
Calcium: 9.5 mg/dL (ref 8.4–10.5)
Chloride: 105 mEq/L (ref 96–112)
Creatinine, Ser: 3.84 mg/dL — ABNORMAL HIGH (ref 0.50–1.10)
GFR calc non Af Amer: 11 mL/min — ABNORMAL LOW (ref >90)
Total Bilirubin: 0.3 mg/dL (ref 0.3–1.2)

## 2013-02-04 LAB — URINALYSIS, ROUTINE W REFLEX MICROSCOPIC
Bilirubin Urine: NEGATIVE
Ketones, ur: 15 mg/dL — AB
Nitrite: NEGATIVE
Urobilinogen, UA: 1 mg/dL (ref 0.0–1.0)

## 2013-02-04 LAB — CBC WITH DIFFERENTIAL/PLATELET
Basophils Relative: 0 % (ref 0–1)
Eosinophils Relative: 0 % (ref 0–5)
HCT: 34.4 % — ABNORMAL LOW (ref 36.0–46.0)
Hemoglobin: 11.7 g/dL — ABNORMAL LOW (ref 12.0–15.0)
MCHC: 34 g/dL (ref 30.0–36.0)
MCV: 86.6 fL (ref 78.0–100.0)
Monocytes Absolute: 0.1 10*3/uL (ref 0.1–1.0)
Monocytes Relative: 1 % — ABNORMAL LOW (ref 3–12)
Neutro Abs: 6.6 10*3/uL (ref 1.7–7.7)
RDW: 13.6 % (ref 11.5–15.5)

## 2013-02-04 LAB — LIPASE, BLOOD: Lipase: 31 U/L (ref 11–59)

## 2013-02-04 LAB — TROPONIN I: Troponin I: <0.3 ng/mL (ref <0.30)

## 2013-02-04 LAB — RAPID URINE DRUG SCREEN, HOSP PERFORMED: Barbiturates: NOT DETECTED

## 2013-02-04 MED ORDER — HYDROMORPHONE HCL PF 1 MG/ML IJ SOLN
1.0000 mg | INTRAMUSCULAR | Status: AC | PRN
  Administered 2013-02-04 (×2): 1 mg via INTRAVENOUS
  Filled 2013-02-04 (×2): qty 1

## 2013-02-04 MED ORDER — DILTIAZEM HCL 25 MG/5ML IV SOLN
10.0000 mg | Freq: Once | INTRAVENOUS | Status: AC
  Administered 2013-02-04: 10 mg via INTRAVENOUS
  Filled 2013-02-04: qty 5

## 2013-02-04 MED ORDER — ASPIRIN 81 MG PO CHEW
324.0000 mg | CHEWABLE_TABLET | Freq: Once | ORAL | Status: AC
  Administered 2013-02-04: 324 mg via ORAL
  Filled 2013-02-04: qty 4

## 2013-02-04 MED ORDER — HYDRALAZINE HCL 20 MG/ML IJ SOLN
10.0000 mg | INTRAMUSCULAR | Status: DC | PRN
  Administered 2013-02-04 – 2013-02-06 (×3): 10 mg via INTRAVENOUS
  Filled 2013-02-04 (×4): qty 1

## 2013-02-04 MED ORDER — ONDANSETRON HCL 4 MG/2ML IJ SOLN
4.0000 mg | Freq: Three times a day (TID) | INTRAMUSCULAR | Status: DC | PRN
  Administered 2013-02-04: 4 mg via INTRAVENOUS
  Filled 2013-02-04: qty 2

## 2013-02-04 MED ORDER — SODIUM CHLORIDE 0.9 % IV BOLUS (SEPSIS)
1000.0000 mL | Freq: Once | INTRAVENOUS | Status: AC
  Administered 2013-02-04: 1000 mL via INTRAVENOUS

## 2013-02-04 MED ORDER — HEPARIN (PORCINE) IN NACL 100-0.45 UNIT/ML-% IJ SOLN
850.0000 [IU]/h | INTRAMUSCULAR | Status: DC
  Administered 2013-02-04: 850 [IU]/h via INTRAVENOUS
  Filled 2013-02-04: qty 250

## 2013-02-04 MED ORDER — ONDANSETRON HCL 4 MG/2ML IJ SOLN
4.0000 mg | Freq: Once | INTRAMUSCULAR | Status: AC
  Administered 2013-02-04: 4 mg via INTRAVENOUS
  Filled 2013-02-04: qty 2

## 2013-02-04 MED ORDER — NITROGLYCERIN 0.4 MG SL SUBL: 0.4000 mg | SUBLINGUAL_TABLET | SUBLINGUAL | Status: DC | PRN

## 2013-02-04 MED ORDER — DILTIAZEM HCL 30 MG PO TABS
30.0000 mg | ORAL_TABLET | Freq: Four times a day (QID) | ORAL | Status: DC
  Administered 2013-02-04 – 2013-02-05 (×3): 30 mg via ORAL
  Filled 2013-02-04 (×7): qty 1

## 2013-02-04 MED ORDER — MORPHINE SULFATE 4 MG/ML IJ SOLN
4.0000 mg | Freq: Once | INTRAMUSCULAR | Status: AC
  Administered 2013-02-04: 4 mg via INTRAVENOUS
  Filled 2013-02-04: qty 1

## 2013-02-04 MED ORDER — LORAZEPAM 2 MG/ML IJ SOLN
1.0000 mg | Freq: Once | INTRAMUSCULAR | Status: AC
  Administered 2013-02-04: 1 mg via INTRAVENOUS
  Filled 2013-02-04: qty 1

## 2013-02-04 MED ORDER — HEPARIN BOLUS VIA INFUSION
4000.0000 [IU] | Freq: Once | INTRAVENOUS | Status: AC
  Administered 2013-02-04: 4000 [IU] via INTRAVENOUS

## 2013-02-04 MED ORDER — REGADENOSON 0.4 MG/5ML IV SOLN
0.4000 mg | Freq: Once | INTRAVENOUS | Status: AC
  Filled 2013-02-04: qty 5

## 2013-02-04 NOTE — ED Provider Notes (Signed)
Medical screening examination/treatment/procedure(s) were performed by non-physician practitioner and as supervising physician I was immediately available for consultation/collaboration.  Threasa Beards, MD 02/04/13 1535

## 2013-02-04 NOTE — ED Notes (Signed)
Admitting MD at bedside.

## 2013-02-04 NOTE — Progress Notes (Signed)
ANTICOAGULATION CONSULT NOTE - Initial Consult  Pharmacy Consult for heparin Indication: chest pain/ACS  Allergies  Allergen Reactions  . Hydrocodone-Acetaminophen     REACTION: Nausea, vomiting and dizziness.    Patient Measurements: weight 85 kg, height inches (per pt)   Heparin Dosing Weight: 71 kg  Vital Signs: Temp: 98 F (36.7 C) (07/31 1259) Temp src: Oral (07/31 1259) BP: 196/98 mmHg (07/31 1600) Pulse Rate: 116 (07/31 1600)  Labs:  Recent Labs  02/04/13 1404  HGB 11.7*  HCT 34.4*  PLT 241  CREATININE 3.84*  TROPONINI <0.30    The CrCl is unknown because both a height and weight (above a minimum accepted value) are required for this calculation.   Medical History: Past Medical History  Diagnosis Date  . CVA (cerebrovascular accident)     New hemorrhagic per CT scan '09  . Fecal impaction   . Diverticulosis of colon   . Hypertension   . Pulmonary nodule   . Dysfunctional uterine bleeding   . Postmenopausal   . OA (osteoarthritis)     bilateral knees  . Colitis   . Bacterial sinusitis 09/17/2011  . TINEA CRURIS 01/12/2007  . HERNIORRHAPHY, HX OF 08/11/2006  . CKD (chronic kidney disease) stage 4, GFR 15-29 ml/min 08/11/2006    Cr continues to increase. Proteinuria on UA 02/10/12.      Medications:  See med rec   Assessment: Patient is a 72 y.o F presented to the ED with c/o CP and urine drug screen positive for cocaine.  To start heparin for r/o ACS with plan for stress test in AM.  Goal of Therapy:  Heparin level 0.3-0.7 units/ml Monitor platelets by anticoagulation protocol: Yes   Plan:  1) heparin 4000 units IV bolus x1, then drip at 850 units/hr 2) check 8 hour heparin level  Philip Eckersley P 02/04/2013,7:41 PM

## 2013-02-04 NOTE — ED Notes (Signed)
Nausea and vomiting since 4 am and paplitations started at 11 am per ems she has IV and they gave her 4 zofran which did not help

## 2013-02-04 NOTE — ED Provider Notes (Signed)
CSN: [id number]     Arrival date & time 02/04/13  1253 History     First MD Initiated Contact with Patient 02/04/13 1314     Chief Complaint  Patient presents with  . Emesis  . Palpitations   (Consider location/radiation/quality/duration/timing/severity/associated sxs/prior Treatment) HPI Comments: Patient presents emergency department with chief complaint of chest pain that started at 11 AM this morning. She states that the chest pain is associated with shortness of breath, and diaphoresis. She states that she has not been doing anything. Additionally, she states that she has had vomiting since 4 AM this morning. She was given Zofran by EMS, which did not help. She states that her emesis is been clear. She states that she took off her morning medicines, but then vomited them back up. Nothing makes her symptoms better or worse.  Cardiac risk factors include hypertension, smoking history, and family history.  The history is provided by the patient. No language interpreter was used.    Past Medical History  Diagnosis Date  . CVA (cerebrovascular accident)     New hemorrhagic per CT scan '09  . Fecal impaction   . Diverticulosis of colon   . Hypertension   . Pulmonary nodule   . Dysfunctional uterine bleeding   . Postmenopausal   . OA (osteoarthritis)     bilateral knees  . Colitis   . Bacterial sinusitis 09/17/2011  . TINEA CRURIS 01/12/2007  . HERNIORRHAPHY, HX OF 08/11/2006  . CKD (chronic kidney disease) stage 4, GFR 15-29 ml/min 08/11/2006    Cr continues to increase. Proteinuria on UA 02/10/12.     Past Surgical History  Procedure Laterality Date  . Inguinal hernia repair  2008  . Abdominal hysterectomy    . Cholecystectomy  2009  . Iridotomy / iridectomy      Laser, right eye 12/26/11 left eye 01/24/12   Family History  Problem Relation Age of Onset  . Hypertension Mother   . Heart attack Mother   . Heart disease Mother    History  Substance Use Topics  . Smoking status:  Never Smoker   . Smokeless tobacco: Never Used  . Alcohol Use: No   OB History   Grav Para Term Preterm Abortions TAB SAB Ect Mult Living                 Review of Systems  All other systems reviewed and are negative.    Allergies  Hydrocodone-acetaminophen  Home Medications   Current Outpatient Rx  Name  Route  Sig  Dispense  Refill  . amLODipine (NORVASC) 10 MG tablet   Oral   Take 1 tablet (10 mg total) by mouth daily.   30 tablet   6   . famotidine (PEPCID) 40 MG tablet      take 1 tablet by mouth once daily   30 tablet   11   . furosemide (LASIX) 40 MG tablet   Oral   Take 40 mg by mouth daily.         . ondansetron (ZOFRAN) 4 MG tablet   Oral   Take 1 tablet (4 mg total) by mouth every 12 (twelve) hours as needed for nausea.   10 tablet   0   . Travoprost, BAK Free, (TRAVATAN) 0.004 % SOLN ophthalmic solution   Both Eyes   Place 1 drop into both eyes at bedtime.          BP 201/107  Pulse 113  Temp(Src) 98 F (  36.7 C) (Oral)  Resp 23  SpO2 98% Physical Exam  Nursing note and vitals reviewed. Constitutional: She is oriented to person, place, and time. She appears well-developed and well-nourished.  HENT:  Head: Normocephalic and atraumatic.  Eyes: Conjunctivae and EOM are normal. Pupils are equal, round, and reactive to light.  Neck: Normal range of motion. Neck supple.  Cardiovascular: Normal rate and regular rhythm.  Exam reveals no gallop and no friction rub.   Murmur heard. Pulmonary/Chest: Effort normal and breath sounds normal. No respiratory distress. She has no wheezes. She has no rales. She exhibits no tenderness.  Abdominal: Soft. Bowel sounds are normal. She exhibits no distension and no mass. There is no tenderness. There is no rebound and no guarding.  Musculoskeletal: Normal range of motion. She exhibits no edema and no tenderness.  Neurological: She is alert and oriented to person, place, and time.  Skin: Skin is warm and dry.   Psychiatric: She has a normal mood and affect. Her behavior is normal. Judgment and thought content normal.    ED Course   Procedures (including critical care time)  Labs Reviewed  CBC WITH DIFFERENTIAL  COMPREHENSIVE METABOLIC PANEL  TROPONIN I  LIPASE, BLOOD  ED ECG REPORT  I personally interpreted this EKG   Date: 02/04/2013   Rate: 111  Rhythm: sinus tachycardia  QRS Axis: normal  Intervals: normal  ST/T Wave abnormalities: repol abnormality  Conduction Disutrbances:none  Narrative Interpretation:   Old EKG Reviewed: unchanged   Results for orders placed during the hospital encounter of 02/04/13  CBC WITH DIFFERENTIAL      Result Value Range   WBC 7.1  4.0 - 10.5 K/uL   RBC 3.97  3.87 - 5.11 MIL/uL   Hemoglobin 11.7 (*) 12.0 - 15.0 g/dL   HCT 34.4 (*) 36.0 - 46.0 %   MCV 86.6  78.0 - 100.0 fL   MCH 29.5  26.0 - 34.0 pg   MCHC 34.0  30.0 - 36.0 g/dL   RDW 13.6  11.5 - 15.5 %   Platelets 241  150 - 400 K/uL   Neutrophils Relative % 93 (*) 43 - 77 %   Neutro Abs 6.6  1.7 - 7.7 K/uL   Lymphocytes Relative 6 (*) 12 - 46 %   Lymphs Abs 0.4 (*) 0.7 - 4.0 K/uL   Monocytes Relative 1 (*) 3 - 12 %   Monocytes Absolute 0.1  0.1 - 1.0 K/uL   Eosinophils Relative 0  0 - 5 %   Eosinophils Absolute 0.0  0.0 - 0.7 K/uL   Basophils Relative 0  0 - 1 %   Basophils Absolute 0.0  0.0 - 0.1 K/uL  COMPREHENSIVE METABOLIC PANEL      Result Value Range   Sodium 141  135 - 145 mEq/L   Potassium 3.6  3.5 - 5.1 mEq/L   Chloride 105  96 - 112 mEq/L   CO2 22  19 - 32 mEq/L   Glucose, Bld 158 (*) 70 - 99 mg/dL   BUN 31 (*) 6 - 23 mg/dL   Creatinine, Ser 3.84 (*) 0.50 - 1.10 mg/dL   Calcium 9.5  8.4 - 10.5 mg/dL   Total Protein 7.5  6.0 - 8.3 g/dL   Albumin 3.9  3.5 - 5.2 g/dL   AST 10  0 - 37 U/L   ALT 5  0 - 35 U/L   Alkaline Phosphatase 63  39 - 117 U/L   Total Bilirubin 0.3  0.3 - 1.2 mg/dL   GFR calc non Af Amer 11 (*) >90 mL/min   GFR calc Af Amer 13 (*) >90 mL/min   TROPONIN I      Result Value Range   Troponin I <0.30  <0.30 ng/mL  LIPASE, BLOOD      Result Value Range   Lipase 31  11 - 59 U/L   Dg Chest 2 View  02/04/2013   *RADIOLOGY REPORT*  Clinical Data: Shortness of breath  CHEST - 2 VIEW  Comparison:  August 17, 2012  Findings:  The lungs are clear.  Heart is upper normal in size with normal pulmonary vascularity.  No adenopathy.  No bone lesions.  IMPRESSION: No edema or consolidation.   Original Report Authenticated By: Lowella Grip, M.D.     1. Chest pain     MDM  Patient with CP, SOB, and diaphoresis since this morning.  Also associated with nausea/vomiting.  Could be anginal vomiting.  Patient has history of cocaine abuse, will order UDS.    3:30 PM Discussed the patient with Dr. Canary Brim.  EKG shows sinus tach, but is unchanged from previous.  Will consult cardiology for admission.  Enzymes are negative thus far.  Patient is hypertensive, but is not having dizziness or headache.   States that she vomited her BP medications.   Montine Circle, PA-C 02/04/13 [phone]

## 2013-02-04 NOTE — ED Notes (Signed)
husband at bedside

## 2013-02-04 NOTE — Consult Note (Signed)
Referring Physician:  Primary Physician: Gilles Chiquito, MD Primary Cardiologist: Shirlee More - saw  Reason for Consultation: Chest pain  HPI: Nancy [name] is a 72 y.o. female with no history of CAD. She was evaluated by cardiology in January 2014 with an echocardiogram showing preserved left ventricular function and grade 1 diastolic dysfunction. The pain at that time was burning and associated with vomiting.  Today, Nancy [name] woke up with substernal chest pain. She states it was a 10/10 and woke her this morning. She did not try any medications. She woke her husband to let him know about the pain and took a bath. The bath decreased the pain to 7/10. When it went back up to a 10/10, she called EMS. EMS gave her some medications and told her her blood pressure was very high. In the emergency room, she has had Zofran 4 mg, morphine 4 mg, sublingual nitroglycerin x1 and aspirin 81 mg x4. Currently her pain is a 9/10. The pain is associated with some slight shortness of breath and some nausea. She has vomited x1 but does not think vomiting change to a pain. She has not been diaphoretic. There is no change in her pain with chest wall palpation. It may get a little better with deep inspiration. It is not positional. She has never had this pain before. She has no history of exertional symptoms. Her urine drug screen today tested positive for cocaine and in fact all 4 drugs screened in the computer were positive for cocaine. Of note the patient admits to being out of her blood pressure medications for 2 days.  Review of Systems:    Cardiac Review of Systems: {Y] = yes [ ]  = no  Chest Pain [  y  ]  Resting SOB [  y ] Exertional SOB  [  ]  Orthopnea [  ]   Pedal Edema [   ]    Palpitations [ y ] Syncope  [  ]   Presyncope [   ]  General Review of Systems: [Y] = yes [  ]=no Constitional: recent weight change [  ]; anorexia [  ]; fatigue [  ]; nausea [  ]; night sweats [  ]; fever [  ]; or chills [  ];                                                                                                                                           Dental: poor dentition[ y ];    Eye : blurred vision [  ]; diplopia [   ]; vision changes [  ];  Amaurosis fugax[  ]; Resp: cough [ y ];  wheezing[  ];  hemoptysis[  ]; shortness of breath[  ]; paroxysmal nocturnal dyspnea[  ]; dyspnea on exertion[  ]; or orthopnea[  ];  GI:  gallstones[  ], vomiting[y  ];  dysphagia[  ]; melena[  ];  hematochezia [  ]; heartburn[  ];   Hx of  Colonoscopy[  ]; GU: kidney stones [  ]; hematuria[  ];   dysuria [  ];  nocturia[  ];  history of     obstruction [  ];                 Skin: rash, swelling[  ];, hair loss[  ];  peripheral edema[  ];  or itching[  ]; Musculosketetal: myalgias[  ];  joint swelling[  ];  joint erythema[  ];  joint pain[  ];  back pain[  ];  Heme/Lymph: bruising[  ];  bleeding[  ];  anemia[  ];  Neuro: TIA[  ];  headaches[  ];  stroke[  ];  vertigo[  ];  seizures[  ];   paresthesias[  ];  difficulty walking[  ];  Psych:depression[  ]; anxiety[  ];  Endocrine: diabetes[  ];  thyroid dysfunction[  ];  Immunizations: Flu [  ]; Pneumococcal[  ];  Other: Abdominal pain, not a new finding  Past Medical History  Diagnosis Date  . CVA (cerebrovascular accident)     New hemorrhagic per CT scan '09  . Fecal impaction   . Diverticulosis of colon   . Hypertension   . Pulmonary nodule   . Dysfunctional uterine bleeding   . Postmenopausal   . OA (osteoarthritis)     bilateral knees  . Colitis   . Bacterial sinusitis 09/17/2011  . TINEA CRURIS 01/12/2007  . HERNIORRHAPHY, HX OF 08/11/2006  . CKD (chronic kidney disease) stage 4, GFR 15-29 ml/min 08/11/2006    Cr continues to increase. Proteinuria on UA 02/10/12.     Past Surgical History  Procedure Laterality Date  . Inguinal hernia repair  2008  . Abdominal hysterectomy    . Cholecystectomy  2009  . Iridotomy / iridectomy      Laser, right eye 12/26/11 left eye 01/24/12      Current Medications:   Infusions:     (Not in a hospital admission)   Allergies  Allergen Reactions  . Hydrocodone-Acetaminophen     REACTION: Nausea, vomiting and dizziness.    History   Social History  . Marital Status: Married    Spouse Name: N/A    Number of Children: N/A  . Years of Education: N/A   Occupational History  . Not on file.   Social History Main Topics  . Smoking status: Never Smoker   . Smokeless tobacco: Never Used  . Alcohol Use: No  . Drug Use: No     Comment: 08/15/08 UDS + cocaine  . Sexually Active: Not on file   Other Topics Concern  . Not on file   Social History Narrative   Married, lives with her husband. 1 child.           Family History  Problem Relation Age of Onset  . Hypertension Mother   . Heart attack Mother   . Heart disease Mother    No family status information on file.    PHYSICAL EXAM: Filed Vitals:   02/04/13 1600  BP: 196/98  Pulse: 116  Temp:   Resp: 21   General:  Well appearing. Mild respiratory difficulty HEENT: normal Neck: supple. JVD. At 8/10. Carotids 2+ bilat; bilateral bruits versus radiation of murmur. No lymphadenopathy or thryomegaly appreciated. Cor: PMI nondisplaced. Regular rate & rhythm. No rubs, gallops; 2-3/6 murmur. Lungs: clear except for a  few rales Abdomen: soft, diffusely tender, nondistended. No hepatosplenomegaly. No bruits or masses. Good bowel sounds. Extremities: no cyanosis, clubbing, rash, no edema Neuro: alert & oriented x 3, cranial nerves grossly intact. moves all 4 extremities w/o difficulty. Affect pleasant.  ECG: Sinus tachycardia, rate 111, probable left atrial enlargement, repolarization abnormality, possibly LVH.  ECHO: 07/13/2012 Conclusions - Left ventricle: The cavity size was moderately dilated. Systolic function was vigorous. The estimated ejection fraction was in the range of 65% to 70%. There was dynamic obstruction at restin the mid cavity. Wall  motion was normal; there were no regional wall motion abnormalities. Doppler parameters are consistent with abnormal left ventricular relaxation (grade 1 diastolic dysfunction). Doppler parameters are consistent with high ventricular filling pressure. - Aortic valve: Valve area: 1.34cm^2(VTI). Valve area: 1.1cm^2 (Vmax). Sclerosis without stenosis. Mean gradient: 83mm Hg (S).  Peak gradient: 63mm Hg (S). - Mitral valve: Calcified annulus. Peak gradient: 61mm Hg - Left atrium: The atrium was moderately dilated. - Pulmonary arteries: Systolic pressure was moderately increased. PA peak pressure: 67mm Hg (S).  Results for orders placed during the hospital encounter of 02/04/13 (from the past 24 hour(s))  CBC WITH DIFFERENTIAL     Status: Abnormal   Collection Time    02/04/13  2:04 PM      Result Value Range   WBC 7.1  4.0 - 10.5 K/uL   RBC 3.97  3.87 - 5.11 MIL/uL   Hemoglobin 11.7 (*) 12.0 - 15.0 g/dL   HCT 34.4 (*) 36.0 - 46.0 %   MCV 86.6  78.0 - 100.0 fL   MCH 29.5  26.0 - 34.0 pg   MCHC 34.0  30.0 - 36.0 g/dL   RDW 13.6  11.5 - 15.5 %   Platelets 241  150 - 400 K/uL   Neutrophils Relative % 93 (*) 43 - 77 %   Neutro Abs 6.6  1.7 - 7.7 K/uL   Lymphocytes Relative 6 (*) 12 - 46 %   Lymphs Abs 0.4 (*) 0.7 - 4.0 K/uL   Monocytes Relative 1 (*) 3 - 12 %   Monocytes Absolute 0.1  0.1 - 1.0 K/uL   Eosinophils Relative 0  0 - 5 %   Eosinophils Absolute 0.0  0.0 - 0.7 K/uL   Basophils Relative 0  0 - 1 %   Basophils Absolute 0.0  0.0 - 0.1 K/uL  COMPREHENSIVE METABOLIC PANEL     Status: Abnormal   Collection Time    02/04/13  2:04 PM      Result Value Range   Sodium 141  135 - 145 mEq/L   Potassium 3.6  3.5 - 5.1 mEq/L   Chloride 105  96 - 112 mEq/L   CO2 22  19 - 32 mEq/L   Glucose, Bld 158 (*) 70 - 99 mg/dL   BUN 31 (*) 6 - 23 mg/dL   Creatinine, Ser 3.84 (*) 0.50 - 1.10 mg/dL   Calcium 9.5  8.4 - 10.5 mg/dL   Total Protein 7.5  6.0 - 8.3 g/dL   Albumin 3.9  3.5 - 5.2  g/dL   AST 10  0 - 37 U/L   ALT 5  0 - 35 U/L   Alkaline Phosphatase 63  39 - 117 U/L   Total Bilirubin 0.3  0.3 - 1.2 mg/dL   GFR calc non Af Amer 11 (*) >90 mL/min   GFR calc Af Amer 13 (*) >90 mL/min  TROPONIN I     Status: None  Collection Time    02/04/13  2:04 PM      Result Value Range   Troponin I <0.30  <0.30 ng/mL  LIPASE, BLOOD     Status: None   Collection Time    02/04/13  2:04 PM      Result Value Range   Lipase 31  11 - 59 U/L  URINALYSIS, ROUTINE W REFLEX MICROSCOPIC     Status: Abnormal   Collection Time    02/04/13  2:50 PM      Result Value Range   Color, Urine YELLOW  YELLOW   APPearance CLOUDY (*) CLEAR   Specific Gravity, Urine 1.022  1.005 - 1.030   pH 6.0  5.0 - 8.0   Glucose, UA 100 (*) NEGATIVE mg/dL   Hgb urine dipstick SMALL (*) NEGATIVE   Bilirubin Urine NEGATIVE  NEGATIVE   Ketones, ur 15 (*) NEGATIVE mg/dL   Protein, ur >300 (*) NEGATIVE mg/dL   Urobilinogen, UA 1.0  0.0 - 1.0 mg/dL   Nitrite NEGATIVE  NEGATIVE   Leukocytes, UA NEGATIVE  NEGATIVE  URINE RAPID DRUG SCREEN (HOSP PERFORMED)     Status: Abnormal   Collection Time    02/04/13  2:50 PM      Result Value Range   Opiates NONE DETECTED  NONE DETECTED   Cocaine POSITIVE (*) NONE DETECTED   Benzodiazepines NONE DETECTED  NONE DETECTED   Amphetamines NONE DETECTED  NONE DETECTED   Tetrahydrocannabinol NONE DETECTED  NONE DETECTED   Barbiturates NONE DETECTED  NONE DETECTED  URINE MICROSCOPIC-ADD ON     Status: Abnormal   Collection Time    02/04/13  2:50 PM      Result Value Range   Squamous Epithelial / LPF MANY (*) RARE   WBC, UA 0-2  <3 WBC/hpf   RBC / HPF 0-2  <3 RBC/hpf   Bacteria, UA RARE  RARE   Casts HYALINE CASTS (*) NEGATIVE   Radiology:  Dg Chest 2 View 02/04/2013   *RADIOLOGY REPORT*  Clinical Data: Shortness of breath  CHEST - 2 VIEW  Comparison:  August 17, 2012  Findings:  The lungs are clear.  Heart is upper normal in size with normal pulmonary vascularity.   No adenopathy.  No bone lesions.  IMPRESSION: No edema or consolidation.   Original Report Authenticated By: Lowella Grip, M.D.    ASSESSMENT: 1.  Hypertension 2.  GERD 3.  Chest pain, mid sternal 4.  CKD (chronic kidney disease) stage 5, GFR less than 15 ml/min 5.  Hyperlipidemia 6.  urine drug screen positive for cocaine 7. systolic murmur  PLAN/DISCUSSION: Nancy [name] is a 72 year old female with a history of hypertension (poorly controlled), hyperlipidemia, CKD stage V and GERD who is here today with chest pain. An echocardiogram in January 2014 showed preserved left ventricular function with diastolic dysfunction and no wall motion abnormalities. She has never had a stress test or a cardiac catheterization.  1. Hypertension: This may be contributing to her symptoms. Will add hydralazine 10 mg IV when necessary for acute blood pressure control but leave further management to internal medicine.  2. GERD: Per IM  3. Chest pain, midsternal: Her symptoms are associated with significant hypertension and tachycardia. Despite hours of pain, her initial cardiac enzymes are negative. Agree with adding aspirin to her medication regimen and continuing to cycle enzymes. If cardiac enzymes remain negative, M.D. advised on further evaluation and treatment.  4. CKD stage V: Per internal medicine  5. Hyperlipidemia:  We'll check profile, management per IM  6. Urine drug screen positive for cocaine - management per IM  7. Systolic murmur: Aortic sclerosis with a mean gradient of 14 mm of Hg was seen on evaluation in January 2014, will recheck echo and follow.  Rosaria Ferries, PA-C 02/04/2013 6:53 PM Beeper [phone]  Patient seen and examined with Rosaria Ferries, PA-C. We discussed all aspects of the encounter. I agree with the assessment and plan as stated above.   Nancy [name] presents with recurrent CP in setting of recent cocaine use and severe HTN. Her ECG and cardiac enzymes remain normal  despite prolonged severe CP. She is not a good candidate for cath due to her advanced renal failure. However, given her CRFs and recurrent CP, I feel she does need further risk stratification. Will order Lexiscan Myoview for the am. If very high risk will need to discuss cath with her. For now would focus on BP control and heparinize until full rule out. Agree with ASA.    She is quite tachycardic here in the ER with HRs up to 140. I did carotid massage which slowed her down and confirmed sinus tach. I worry she may be withdrawing. Will give 1mg  ativan here and start some cardizem. Further management per IM.   Benay Spice 7:29 PM

## 2013-02-04 NOTE — ED Provider Notes (Signed)
Pt received from [place] at shift change.  Pt is to be admitted for CP r/o.  [place] cardiology evaluated the pt in the ED but has elected not to admit due to pts + cocaine-- they will consult.   Consulted internal medicine, Dr. Murlean Caller-- pt to be admitted to [facility], observation.  Temp admit holding orders placed.  VS stable for transfer to floor.  Larene Pickett, PA-C 02/04/13 2353

## 2013-02-04 NOTE — H&P (Signed)
Date: 02/04/2013               Patient Name:  Nancy [name] MRN: [id number]  DOB: 1941/05/21 Age / Sex: 72 y.o., female   PCP: Sid Falcon, MD         Medical Service: Internal Medicine Teaching Service         Attending Physician: Dr. Dominic Pea, DO    First Contact: Dr. Juluis Mire, MD Pager: [phone]  Second Contact: Dr. Charlann Lange, MD Pager: [phone]       After Hours (After 5p/  First Contact Pager: [phone]  weekends / holidays): Second Contact Pager: [phone]   Chief Complaint: Chest Pain  History of Present Illness: Nancy [name] is a 72 yo AAF with a PMH of CVA, HTN, ESRD, and hyperlipidemia.  She presents to the ED with non-radiating substernal chest pain when she woke up this am.  She describes the pain as dull, diffuse, 10/10, nonexertional, and it comes and goes.  She admits to feeling some SOB, nausea, and vomited once this morning.  Upon questioning, she confirms that she hasn't taken her BP medicine in about a week.  She denies any additional symptoms.  She has no known h/o CAD.    In the ED,she received Zofran, morphine, nitroglycerin, and aspirin.  Of note, her UDS today was positive for cocaine.  Cardiology saw the pt in the ED and added hydralazine 10mg  for HTN PRN and recommended adding ASA to her medications and continue to trend troponins.    Additionally, a TTE on 07/13/2012 showed EF: 65-70%.     Meds: Current Facility-Administered Medications  Medication Dose Route Frequency Provider Last Rate Last Dose  . diltiazem (CARDIZEM) tablet 30 mg  30 mg Oral Q6H Rhonda G Barrett, PA-C      . heparin ADULT infusion 100 units/mL (25000 units/250 mL)  850 Units/hr Intravenous Continuous Anh P Pham, RPH      . heparin bolus via infusion 4,000 Units  4,000 Units Intravenous Once Anh P Pham, RPH      . hydrALAZINE (APRESOLINE) injection 10 mg  10 mg Intravenous Q4H PRN Evelene Croon Barrett, PA-C   10 mg at 02/04/13 1850  . HYDROmorphone (DILAUDID) injection 1 mg  1 mg  Intravenous Q4H PRN Larene Pickett, PA-C   1 mg at 02/04/13 1855  . nitroGLYCERIN (NITROSTAT) SL tablet 0.4 mg  0.4 mg Sublingual Q5 Min x 3 PRN Montine Circle, PA-C      . ondansetron [facility]) injection 4 mg  4 mg Intravenous Q8H PRN Larene Pickett, PA-C   4 mg at 02/04/13 1855  . [START ON 02/05/2013] regadenoson (LEXISCAN) injection SOLN 0.4 mg  0.4 mg Intravenous Once Lonn Samia, PA-C       Current Outpatient Prescriptions  Medication Sig Dispense Refill  . amLODipine (NORVASC) 10 MG tablet Take 1 tablet (10 mg total) by mouth daily.  30 tablet  6  . cloNIDine (CATAPRES) 0.1 MG tablet Take 0.1 mg by mouth 2 (two) times daily.      . famotidine (PEPCID) 40 MG tablet take 1 tablet by mouth once daily  30 tablet  11  . furosemide (LASIX) 40 MG tablet Take 40 mg by mouth daily.      . Travoprost, BAK Free, (TRAVATAN) 0.004 % SOLN ophthalmic solution Place 1 drop into both eyes at bedtime.        Allergies: Allergies as of 02/04/2013 - Review Complete 02/04/2013  Allergen  Reaction Noted  . Hydrocodone-acetaminophen     Past Medical History  Diagnosis Date  . CVA (cerebrovascular accident)     New hemorrhagic per CT scan '09  . Fecal impaction   . Diverticulosis of colon   . Hypertension   . Pulmonary nodule   . Dysfunctional uterine bleeding   . Postmenopausal   . OA (osteoarthritis)     bilateral knees  . Colitis   . Bacterial sinusitis 09/17/2011  . TINEA CRURIS 01/12/2007  . HERNIORRHAPHY, HX OF 08/11/2006  . CKD (chronic kidney disease) stage 4, GFR 15-29 ml/min 08/11/2006    Cr continues to increase. Proteinuria on UA 02/10/12.     Past Surgical History  Procedure Laterality Date  . Inguinal hernia repair  2008  . Abdominal hysterectomy    . Cholecystectomy  2009  . Iridotomy / iridectomy      Laser, right eye 12/26/11 left eye 01/24/12   Family History  Problem Relation Age of Onset  . Hypertension Mother   . Heart attack Mother   . Heart disease Mother    History     Social History  . Marital Status: Married    Spouse Name: N/A    Number of Children: N/A  . Years of Education: N/A   Occupational History  . Not on file.   Social History Main Topics  . Smoking status: Never Smoker   . Smokeless tobacco: Never Used  . Alcohol Use: No  . Drug Use: No     Comment: 08/15/08 UDS + cocaine  . Sexually Active: Not on file   Other Topics Concern  . Not on file   Social History Narrative   Married, lives with her husband. 1 child.           Review of Systems: Pertinent items are noted in HPI.  Physical Exam:  Blood pressure 125/71, pulse 132, temperature 98 F (36.7 C), temperature source Oral, resp. rate 24, SpO2 95.00%.  Physical Exam  Constitutional: She is well-developed, well-nourished, and in no distress.  HENT:  Head: Normocephalic and atraumatic.  Eyes: Conjunctivae and EOM are normal.  Neck: Neck supple. Carotid bruit is not present.  Cardiovascular: Regular rhythm and intact distal pulses.  Tachycardia present.   Murmur heard. Pulmonary/Chest: Effort normal.  Abdominal: Soft. Bowel sounds are normal. She exhibits no distension. There is no tenderness.  Skin: Skin is warm and dry.  Extremities: 1+pitting edema b/l LE  Lab results: Basic Metabolic Panel:  Recent Labs  02/04/13 1404  NA 141  K 3.6  CL 105  CO2 22  GLUCOSE 158*  BUN 31*  CREATININE 3.84*  CALCIUM 9.5   Liver Function Tests:  Recent Labs  02/04/13 1404  AST 10  ALT 5  ALKPHOS 63  BILITOT 0.3  PROT 7.5  ALBUMIN 3.9    Recent Labs  02/04/13 1404  LIPASE 31   CBC:  Recent Labs  02/04/13 1404  WBC 7.1  NEUTROABS 6.6  HGB 11.7*  HCT 34.4*  MCV 86.6  PLT 241   Cardiac Enzymes:  Recent Labs  02/04/13 1404 02/04/13 1850  TROPONINI <0.30 <0.30   Urine Drug Screen: Drugs of Abuse     Component Value Date/Time   LABOPIA NONE DETECTED 02/04/2013 1450   COCAINSCRNUR POSITIVE* 02/04/2013 1450   COCAINSCRNUR NEG 09/21/2009 2031    LABBENZ NONE DETECTED 02/04/2013 1450   LABBENZ NEG 09/21/2009 2031   AMPHETMU NONE DETECTED 02/04/2013 1450   AMPHETMU NEG 09/21/2009 2031  THCU NONE DETECTED 02/04/2013 1450   LABBARB NONE DETECTED 02/04/2013 1450    Urinalysis:  Recent Labs  02/04/13 1450  COLORURINE YELLOW  LABSPEC 1.022  PHURINE 6.0  GLUCOSEU 100*  HGBUR SMALL*  BILIRUBINUR NEGATIVE  KETONESUR 15*  PROTEINUR >300*  UROBILINOGEN 1.0  NITRITE NEGATIVE  LEUKOCYTESUR NEGATIVE    Imaging results:  Dg Chest 2 View  02/04/2013   *RADIOLOGY REPORT*  Clinical Data: Shortness of breath  CHEST - 2 VIEW  Comparison:  August 17, 2012  Findings:  The lungs are clear.  Heart is upper normal in size with normal pulmonary vascularity.  No adenopathy.  No bone lesions.  IMPRESSION: No edema or consolidation.   Original Report Authenticated By: Lowella Grip, M.D.    Other results: ED ECG REPORT   Date: 02/04/2013  EKG Time: 9:57 PM  Rate: 111  Rhythm: sinus tachycardia,  Axis: normal  Intervals:none  ST&T Change: unchanged from previous EKG in 08/17/2012  Narrative Interpretation: probable left atrial enlargement, repolarization  abnormality, possibly LVH.    Assessment & Plan by Problem:  Ms. Morsey is a 72 yo AAF with a PMH of CVA, HTN, ESRD and hyperlipidemia who presents to the ED with substernal chest pain this am.    1. Chest Pain: Pt has symptoms in the context of a +UDS for cocaine; Additionally, she had not been taking her BP medications at home for the past week;  In the context of her failure to take her medications in addition to cocaine use most likely is the cause of her CP due to hypertension and tachycardia; troponin x2 have been negative thus far and no new EKG changes; risk factors for CAD including hyperlipidemia, HTN, ESRD; Timi Score:4 which signifies 20% risk at 14 days of: all-cause mortality, new/recurrent MI, or severe recurrent ischemia requiring urgent revascularization.  -cardiology  following -ASA -troponinsx3 -0.4 mg Nitro  -dilaudid 1mg  q4hrs PRN pain -O2 [place]@2L  -heparin ggt@8 .55ml/hr -lexiscan echo  -zofran 4mg   -CBC   2. Hypertension: Currently BP: 122/69; as high as 208/109 in the ED; trending down on hydralazine; home medications are norvasc, clonidine, lasix  Filed Vitals:   02/04/13 2030 02/04/13 2045 02/04/13 2100 02/04/13 2115  BP: 125/71 126/78 115/62 122/69  Pulse: 132 129 127 126  Temp:      TempSrc:      Resp: 24 21 23 21   SpO2: 95% 94% 94% 94%   -hydralazine 10mg  IV q4 PRN, SBP>170   3. Tachycardia: Likely cocaine induced  -cardizem 30,g q 6hrs    4. ESRD: Creatinine: 3.84 upon admission which is at her baseline; She is followed by nephrologist, Dr. Mercy Moore last seen May 2014 but not currently on dialysis;pt referred and went to appt at [facility] for AV fistula placement but refused the exam/consult when she arrived  -continue to monitor   5. GERD: takes pepcid at home  -continue pepcid   Dispo: Disposition is deferred at this time, awaiting improvement of current medical problems. Anticipated discharge in approximately 3-4 day(s).   The patient does have a current PCP Sid Falcon, MD) and does need an [facility] hospital follow-up appointment after discharge.   Signed: Michail Jewels, MD 02/04/2013, 8:54 PM

## 2013-02-05 ENCOUNTER — Inpatient Hospital Stay (HOSPITAL_COMMUNITY): Payer: PRIVATE HEALTH INSURANCE

## 2013-02-05 DIAGNOSIS — I359 Nonrheumatic aortic valve disorder, unspecified: Secondary | ICD-10-CM

## 2013-02-05 DIAGNOSIS — R892 Abnormal level of other drugs, medicaments and biological substances in specimens from other organs, systems and tissues: Secondary | ICD-10-CM

## 2013-02-05 DIAGNOSIS — N185 Chronic kidney disease, stage 5: Secondary | ICD-10-CM

## 2013-02-05 LAB — CBC
HCT: 33.5 % — ABNORMAL LOW (ref 36.0–46.0)
Hemoglobin: 11.2 g/dL — ABNORMAL LOW (ref 12.0–15.0)
MCH: 29.9 pg (ref 26.0–34.0)
MCV: 89.6 fL (ref 78.0–100.0)
RBC: 3.74 MIL/uL — ABNORMAL LOW (ref 3.87–5.11)

## 2013-02-05 MED ORDER — TECHNETIUM TC 99M SESTAMIBI GENERIC - CARDIOLITE
10.0000 | Freq: Once | INTRAVENOUS | Status: AC | PRN
  Administered 2013-02-05: 10 via INTRAVENOUS

## 2013-02-05 MED ORDER — DILTIAZEM HCL 60 MG PO TABS
60.0000 mg | ORAL_TABLET | Freq: Four times a day (QID) | ORAL | Status: DC
  Administered 2013-02-05 – 2013-02-06 (×3): 60 mg via ORAL
  Filled 2013-02-05 (×9): qty 1

## 2013-02-05 MED ORDER — SODIUM CHLORIDE 0.9 % IV SOLN: INTRAVENOUS | Status: DC

## 2013-02-05 MED ORDER — ACETAMINOPHEN 325 MG PO TABS: 650.0000 mg | ORAL_TABLET | Freq: Once | ORAL | Status: AC

## 2013-02-05 MED ORDER — ACETAMINOPHEN 325 MG PO TABS
ORAL_TABLET | ORAL | Status: AC
  Administered 2013-02-05: 650 mg via ORAL
  Filled 2013-02-05: qty 2

## 2013-02-05 MED ORDER — HEPARIN SODIUM (PORCINE) 5000 UNIT/ML IJ SOLN
5000.0000 [IU] | Freq: Three times a day (TID) | INTRAMUSCULAR | Status: DC
  Administered 2013-02-05 – 2013-02-06 (×2): 5000 [IU] via SUBCUTANEOUS
  Filled 2013-02-05 (×5): qty 1

## 2013-02-05 MED ORDER — TECHNETIUM TC 99M SESTAMIBI GENERIC - CARDIOLITE
30.0000 | Freq: Once | INTRAVENOUS | Status: AC | PRN
  Administered 2013-02-05: 30 via INTRAVENOUS

## 2013-02-05 MED ORDER — REGADENOSON 0.4 MG/5ML IV SOLN
INTRAVENOUS | Status: AC
  Administered 2013-02-05: 0.4 mg via INTRAVENOUS
  Filled 2013-02-05: qty 5

## 2013-02-05 MED ORDER — ONDANSETRON HCL 4 MG/2ML IJ SOLN
INTRAMUSCULAR | Status: AC
  Filled 2013-02-05: qty 2

## 2013-02-05 MED ORDER — ONDANSETRON HCL 4 MG/2ML IJ SOLN
4.0000 mg | Freq: Three times a day (TID) | INTRAMUSCULAR | Status: AC | PRN
  Administered 2013-02-05: 4 mg via INTRAVENOUS

## 2013-02-05 MED ORDER — LORAZEPAM 2 MG/ML IJ SOLN
1.0000 mg | INTRAMUSCULAR | Status: DC | PRN
  Administered 2013-02-05: 1 mg via INTRAVENOUS
  Filled 2013-02-05: qty 1

## 2013-02-05 NOTE — Progress Notes (Signed)
CSW went to speak to patient to do a substance use assessment. Patient reported now is not a good time, she does not feel well and has been throwing up. CSW will attempt to talk to her later today or pass it on to the weekend.   Jeanette Caprice, MSW, [place]

## 2013-02-05 NOTE — Progress Notes (Signed)
  Echocardiogram 2D Echocardiogram has been performed.  Nancy [name] [name] 02/05/2013, 1:38 PM

## 2013-02-05 NOTE — Progress Notes (Signed)
Lexiscan Cardiolite performed. 

## 2013-02-05 NOTE — Progress Notes (Signed)
    SUBJECTIVE: C/O chest pain with deep inspiration or cough. Chest wall and abdomen are tender to palpation.   Filed Vitals:   02/04/13 2200 02/04/13 2230 02/04/13 2322 02/05/13 0835  BP: 117/62 115/78 143/71 166/73  Pulse: 122 120 124 111  Temp:   98 F (36.7 C)   TempSrc:   Oral   Resp: 20 23 18    Height:   5\' 3"  (1.6 m)   Weight:   185 lb 3 oz (84 kg)   SpO2: 94% 97% 97%     Intake/Output Summary (Last 24 hours) at 02/05/13 0913 Last data filed at 02/05/13 0600  Gross per 24 hour  Intake   59.5 ml  Output    150 ml  Net  -90.5 ml    LABS: Basic Metabolic Panel:  Recent Labs  02/04/13 1404  NA 141  K 3.6  CL 105  CO2 22  GLUCOSE 158*  BUN 31*  CREATININE 3.84*  CALCIUM 9.5   Liver Function Tests:  Recent Labs  02/04/13 1404  AST 10  ALT 5  ALKPHOS 63  BILITOT 0.3  PROT 7.5  ALBUMIN 3.9    Recent Labs  02/04/13 1404  LIPASE 31   CBC:  Recent Labs  02/04/13 1404  WBC 7.1  NEUTROABS 6.6  HGB 11.7*  HCT 34.4*  MCV 86.6  PLT 241   Cardiac Enzymes:  Recent Labs  02/04/13 1404 02/04/13 1850 02/05/13 0032  TROPONINI <0.30 <0.30 <0.30   RADIOLOGY: Dg Chest 2 View 02/04/2013   *RADIOLOGY REPORT*  Clinical Data: Shortness of breath  CHEST - 2 VIEW  Comparison:  August 17, 2012  Findings:  The lungs are clear.  Heart is upper normal in size with normal pulmonary vascularity.  No adenopathy.  No bone lesions.  IMPRESSION: No edema or consolidation.   Original Report Authenticated By: Lowella Grip, M.D.    PHYSICAL EXAM General: NAD Neck: No JVD, no thyromegaly or thyroid nodule.  Lungs: Clear to auscultation bilaterally with normal respiratory effort. Chest wall tender to palpation CV: Nondisplaced PMI.  Heart regular S1/S2, no S3/S4, 2-3/6 murmur.  No peripheral edema.  No carotid bruit.  Normal pedal pulses.  Abdomen: Soft, diffusely tender, worse on left side, no guarding, no hepatosplenomegaly, no distention.  Neurologic:  Alert and oriented x 3.  Psych: Normal affect. Extremities: No clubbing or cyanosis.   TELEMETRY: Reviewed telemetry pt in [place]:   Chest pain, mid sternal - symptoms atypical for CAD and enzymes negative for MI. Cardiolite to assess for ischemia. Further treatment decisions based on results.     SEM - Hx Ao sclerosis with mean gradient 14 mmHg, echo today to re-assess  Active Problems:   Hypertension - per IM, off meds PTA. With tachycardia, will increase Cardizem.   HYPERLIPIDEMIA - per IM   GERD - per IM   CKD (chronic kidney disease) stage 5, GFR less than 15 ml/min - Cr at baseline, per IM   Positive urine drug screen - for cocaine. No BB.    Rosaria Ferries, PA-C 02/05/2013 9:13 AM Beeper [phone]  Seen with PA, agree with note.  Cardiolite for atypical chest pain, echo to reassess aortic stenosis.   Loralie Champagne 02/05/2013 12:28 PM

## 2013-02-05 NOTE — Progress Notes (Signed)
Patient complaining of 3/10 chest pain, no radiation, no SOB, some nausea which started at 0908. Nail beds pink, skin is warm and dry. VS stable. R Barrett PA aware. Will go ahead with [employer]. Chest pain gone now. Patient states it starts when she coughs.

## 2013-02-05 NOTE — Progress Notes (Signed)
Subjective: Patient states that she feels ok. No c/o. No acute distress noted. No acute event overnight.  She is on heparin gtt now managed by cardiologist. Pending Myoview test.  Objective: Vital signs in last 24 hours: Filed Vitals:   02/05/13 0929 02/05/13 0931 02/05/13 0933 02/05/13 0935  BP: 186/71 174/71 175/72 177/74  Pulse: 121 116 117 116  Temp:      TempSrc:      Resp:      Height:      Weight:      SpO2:       Weight change:   Intake/Output Summary (Last 24 hours) at 02/05/13 1348 Last data filed at 02/05/13 0900  Gross per 24 hour  Intake   59.5 ml  Output    150 ml  Net  -90.5 ml     Lab Results: Basic Metabolic Panel:  Recent Labs Lab 02/04/13 1404  Destina Mantei 141  K 3.6  CL 105  CO2 22  GLUCOSE 158*  BUN 31*  CREATININE 3.84*  CALCIUM 9.5   Liver Function Tests:  Recent Labs Lab 02/04/13 1404  AST 10  ALT 5  ALKPHOS 63  BILITOT 0.3  PROT 7.5  ALBUMIN 3.9    Recent Labs Lab 02/04/13 1404  LIPASE 31   No results found for this basename: AMMONIA,  in the last 168 hours CBC:  Recent Labs Lab 02/04/13 1404 02/05/13 1230  WBC 7.1 13.7*  NEUTROABS 6.6  --   HGB 11.7* 11.2*  HCT 34.4* 33.5*  MCV 86.6 89.6  PLT 241 240   Cardiac Enzymes:  Recent Labs Lab 02/04/13 1404 02/04/13 1850 02/05/13 0032  TROPONINI <0.30 <0.30 <0.30   Urine Drug Screen: Drugs of Abuse     Component Value Date/Time   LABOPIA NONE DETECTED 02/04/2013 1450   COCAINSCRNUR POSITIVE* 02/04/2013 1450   COCAINSCRNUR NEG 09/21/2009 2031   LABBENZ NONE DETECTED 02/04/2013 1450   LABBENZ NEG 09/21/2009 2031   AMPHETMU NONE DETECTED 02/04/2013 1450   AMPHETMU NEG 09/21/2009 2031   THCU NONE DETECTED 02/04/2013 1450   LABBARB NONE DETECTED 02/04/2013 1450    Urinalysis:  Recent Labs Lab 02/04/13 1450  COLORURINE YELLOW  LABSPEC 1.022  PHURINE 6.0  GLUCOSEU 100*  HGBUR SMALL*  BILIRUBINUR NEGATIVE  KETONESUR 15*  PROTEINUR >300*  UROBILINOGEN 1.0    NITRITE NEGATIVE  LEUKOCYTESUR NEGATIVE     Micro Results: No results found for this or any previous visit (from the past 240 hour(s)). Studies/Results: Dg Chest 2 View  02/04/2013   *RADIOLOGY REPORT*  Clinical Data: Shortness of breath  CHEST - 2 VIEW  Comparison:  August 17, 2012  Findings:  The lungs are clear.  Heart is upper normal in size with normal pulmonary vascularity.  No adenopathy.  No bone lesions.  IMPRESSION: No edema or consolidation.   Original Report Authenticated By: Lowella Grip, M.D.   Nm Myocar Multi W/spect W/wall Motion / Ef  02/05/2013   *RADIOLOGY REPORT*  Clinical Data:  Chest pain and hypertension.  History of stroke.  MYOCARDIAL IMAGING WITH SPECT (REST AND PHARMACOLOGIC-STRESS) GATED LEFT VENTRICULAR WALL MOTION STUDY LEFT VENTRICULAR EJECTION FRACTION  Technique:  Standard myocardial SPECT imaging was performed after resting intravenous injection of 10 mCi Tc-32m tetrofosmin. Subsequently, intravenous infusion of Lexiscan was performed under the supervision of the Cardiology staff.  At peak effect of the drug, 30 mCi Tc-37m tetrofosmin was injected intravenously and standard myocardial SPECT  imaging was performed.  Quantitative gated  imaging was also performed to evaluate left ventricular wall motion, and estimate left ventricular ejection fraction.  Comparison:  None.  Findings: Multiplanar SPECT imaging shows no fixed or reversible perfusion defect within the left ventricle myocardium.  The left ventricular cavity size appears grossly normal.  Images obtained with cardiac gating displayed using a surface rendering algorithm reveal no segmental wall motion abnormality.  Left ventricular end-diastolic volume is calculated at 78 ml.  The left ventricular end systolic volume is calculated at 21 ml.  The derived left ventricular ejection fraction is 74%.  IMPRESSION: Normal exam.   Original Report Authenticated By: Misty Stanley, M.D.   Medications: I have reviewed  the patient's current medications. Scheduled Meds: . diltiazem  60 mg Oral Q6H   Continuous Infusions:  PRN Meds:.hydrALAZINE, LORazepam, nitroGLYCERIN, ondansetron (ZOFRAN) IV  Assessment/Plan:  #. Chest pain     Patient is chest pain free now. She has ruled out ACS. The etiology is likely associated with her cocaine abuse. Patient is noted to have positive cocaine on all her UDS since 2010. Myoview and echo pending per cardiology.   - Appreciates Cardiology input! - Oxygen, ASA and Nitro tab  - Will defer the management of heparin drip to cardiology. - Myoview and Echo pending - risk stratification- A1C and FLP in am  #. Sinus tachycardia- carotid massage done by cardiologist at ED     Likely associated with her cocaine abuse - Diltiazem po initiated by cardiology - Ativan PRN   #. Cocaine abuse - SW education - avoid BB  #. HTN Patient presents with elevated BP of 200's/100's on admission. She is noncompliant with her therapy. She has a clonidine bottle with fill date in 2012.  We have contacted her pharmacy who reports to [place] the following refill history.          1. Clonidine # 30/month was refilled Jan, May and June 2014.     2. Norvasc Refill history was in Dec 2013 and Jan 2014. D/C'd by her Nephrologist per patient.  - The goal of blood pressure is ~ 160/100 for now ( suspect she has chronic higher BP due to noncompliance) - Will D/C Clonidine given her medical noncompliance - will not restart her norvasc either upon discharge. - continue Diltiazem    #. HLD - not on statin - FLP pending  #. CKD stage 5 - stable - defer it to outpatient nephrology management  # VTE      Heparin SQ    Dispo: Disposition is deferred at this time, awaiting improvement of current medical problems.  Anticipated discharge in approximately 1-2 day(s).   The patient does have a current PCP Sid Falcon, MD) and does need an [facility] hospital follow-up appointment after  discharge.  The patient does not have transportation limitations that hinder transportation to clinic appointments.  .Services Needed at time of discharge: Y = Yes, Blank = No PT:   OT:   RN:   Equipment:   Other:     LOS: 1 day   Charlann Lange, MD 02/05/2013, 1:48 PM

## 2013-02-05 NOTE — H&P (Signed)
INTERNAL MEDICINE TEACHING SERVICE Attending Admission Note  Date: 02/05/2013  Patient name: Nancy [name]  Medical record number: [id number]  Date of birth: 24-Dec-1940    I have seen and evaluated Nancy E Siers and discussed their care with the Residency Team. Patient is CKD stage 5 w/ known hx of HTN, hx CVA w/ hemorrhagic cerebellar bleed 12/2007, HL, with CP. She has a known hx of cocaine use and admits to recent use.  She has uncontrolled HTN. She has ruled out for ACS.  Lexiscan performed today with no evidence of ischemia.  It looks like she hasn't filled her medications in over two years, will need to call pharmacy to verify this (last filled date on clonidine bottle).   Discontinue Heparin gtt. Cont ASA. Agree with diltiazem dosing, but change to once daily dosing (extended release) to increase adherence.  Tachycardia may be due to recent cocaine use.  Watch BP.  Would treat with BZD. She is CP free.    Dominic Pea, [place] 8/1/20141:34 PM

## 2013-02-05 NOTE — Progress Notes (Signed)
Patient arrived to unit at approximately 23:15. RN noticed patient looking uncomfortable.  Patient rated chest pain 8/10. 1 mg IV dilaudid administered. Patient heart rate 130's. Blood pressure stable.  Internal Medicine paged.  RN asked patient to rate chest pain, patient rated chest pain 7/10. Patient  Stated " the pain is starting to lessen".  Rn rechecked on patient in 15 minutes ,patient rated chest pain 6/10 and patient resting comfortable.  Internal Medicine returned page and informed RN to keep MD aware if patient appears in distress and worsening chest pain.  No orders received.  RN to monitor patient

## 2013-02-06 LAB — CBC
HCT: 35.1 % — ABNORMAL LOW (ref 36.0–46.0)
MCH: 29.4 pg (ref 26.0–34.0)
MCV: 89.8 fL (ref 78.0–100.0)
Platelets: 225 10*3/uL (ref 150–400)
RBC: 3.91 MIL/uL (ref 3.87–5.11)
WBC: 14.8 10*3/uL — ABNORMAL HIGH (ref 4.0–10.5)

## 2013-02-06 LAB — HEMOGLOBIN A1C: Hgb A1c MFr Bld: 5.5 % (ref <5.7)

## 2013-02-06 LAB — LIPID PANEL
HDL: 62 mg/dL (ref >39)
LDL Cholesterol: 89 mg/dL (ref 0–99)
Total CHOL/HDL Ratio: 2.8 RATIO

## 2013-02-06 LAB — BASIC METABOLIC PANEL
CO2: 17 mEq/L — ABNORMAL LOW (ref 19–32)
Calcium: 9.4 mg/dL (ref 8.4–10.5)
Chloride: 106 mEq/L (ref 96–112)
Creatinine, Ser: 4.7 mg/dL — ABNORMAL HIGH (ref 0.50–1.10)
Glucose, Bld: 115 mg/dL — ABNORMAL HIGH (ref 70–99)
Sodium: 140 mEq/L (ref 135–145)

## 2013-02-06 MED ORDER — DILTIAZEM HCL ER COATED BEADS 240 MG PO CP24: 240.0000 mg | ORAL_CAPSULE | Freq: Every day | ORAL | Status: DC

## 2013-02-06 MED ORDER — LORAZEPAM 1 MG PO TABS
1.0000 mg | ORAL_TABLET | Freq: Two times a day (BID) | ORAL | Status: DC
  Administered 2013-02-06: 1 mg via ORAL
  Filled 2013-02-06: qty 1

## 2013-02-06 NOTE — Progress Notes (Signed)
   Subjective:  Denies CP or dyspnea   Objective:  Filed Vitals:   02/05/13 2338 02/06/13 0430 02/06/13 0550 02/06/13 0632  BP: 160/80 173/91 194/90 168/74  Pulse:  112    Temp:  98.2 F (36.8 C)    TempSrc:  Oral    Resp:  20    Height:      Weight:      SpO2:  91%      Intake/Output from previous day:  Intake/Output Summary (Last 24 hours) at 02/06/13 1004 Last data filed at 02/06/13 0400  Gross per 24 hour  Intake      0 ml  Output    650 ml  Net   -650 ml    Physical Exam: Physical exam: Well-developed well-nourished in no acute distress.  Skin is warm and dry.  HEENT is normal.  Neck is supple.  Chest is clear to auscultation with normal expansion.  Cardiovascular exam is regular rate and rhythm. 2/6 systolic murmur Abdominal exam nontender or distended. No masses palpated. Extremities show no edema. neuro grossly intact    Lab Results: Basic Metabolic Panel:  Recent Labs  02/04/13 1404 02/06/13 0450  NA 141 140  K 3.6 3.9  CL 105 106  CO2 22 17*  GLUCOSE 158* 115*  BUN 31* 35*  CREATININE 3.84* 4.70*  CALCIUM 9.5 9.4   CBC:  Recent Labs  02/04/13 1404 02/05/13 1230 02/06/13 0450  WBC 7.1 13.7* 14.8*  NEUTROABS 6.6  --   --   HGB 11.7* 11.2* 11.5*  HCT 34.4* 33.5* 35.1*  MCV 86.6 89.6 89.8  PLT 241 240 225   Cardiac Enzymes:  Recent Labs  02/04/13 1850 02/05/13 0032 02/05/13 1230  TROPONINI <0.30 <0.30 <0.30     Assessment/Plan:  1 chest pain-enzymes negative. Nuclear study normal. No plans for further ischemia evaluation. 2 mild aortic stenosis-patient should followup with Dr. Aundra Dubin in approximately one year for this issue. 3 chronic stage 5 renal failure-management per primary care. 4 substance abuse-patient needs to discontinue. 5 hypertension-the patient should followup with primary care physician. Would consolidate cardizem at DC We will sign off. Please call with questions. Kirk Ruths 02/06/2013, 10:04  AM

## 2013-02-06 NOTE — Progress Notes (Signed)
Subjective: Patient states that she feels fine. No c/o, no chest pain or SOB. States," I am ready to go home". No acute events overnight.  Objective: Vital signs in last 24 hours: Filed Vitals:   02/05/13 2338 02/06/13 0430 02/06/13 0550 02/06/13 0632  BP: 160/80 173/91 194/90 168/74  Pulse:  112    Temp:  98.2 F (36.8 C)    TempSrc:  Oral    Resp:  20    Height:      Weight:      SpO2:  91%     Weight change:   Intake/Output Summary (Last 24 hours) at 02/06/13 1354 Last data filed at 02/06/13 0400  Gross per 24 hour  Intake      0 ml  Output    650 ml  Net   -650 ml   General: NAD, obese. Neck: supple, full ROM, no thyromegaly. Lungs: normal respiratory effort, no accessory muscle use, normal breath sounds, no crackles, and no wheezes. Heart: normal rate, regular rhythm, no murmur, no gallop, and no rub.  Abdomen: soft, non-tender, normal bowel sounds, no distention, no guarding, no rebound tenderness, no hepatomegaly, and no splenomegaly.  Msk: no joint swelling, no joint warmth, and no redness over joints.  Pulses: 2+ DP/PT pulses bilaterally Extremities: No cyanosis, clubbing, edema Neurologic: alert & oriented X3.   Lab Results: Basic Metabolic Panel:  Recent Labs Lab 02/04/13 1404 02/06/13 0450  Emberlie Gotcher 141 140  K 3.6 3.9  CL 105 106  CO2 22 17*  GLUCOSE 158* 115*  BUN 31* 35*  CREATININE 3.84* 4.70*  CALCIUM 9.5 9.4   Liver Function Tests:  Recent Labs Lab 02/04/13 1404  AST 10  ALT 5  ALKPHOS 63  BILITOT 0.3  PROT 7.5  ALBUMIN 3.9    Recent Labs Lab 02/04/13 1404  LIPASE 31   CBC:  Recent Labs Lab 02/04/13 1404 02/05/13 1230 02/06/13 0450  WBC 7.1 13.7* 14.8*  NEUTROABS 6.6  --   --   HGB 11.7* 11.2* 11.5*  HCT 34.4* 33.5* 35.1*  MCV 86.6 89.6 89.8  PLT 241 240 225   Cardiac Enzymes:  Recent Labs Lab 02/04/13 1850 02/05/13 0032 02/05/13 1230  TROPONINI <0.30 <0.30 <0.30   Fasting Lipid Panel:  Recent Labs Lab  02/06/13 0450  CHOL 176  HDL 62  LDLCALC 89  TRIG 124  CHOLHDL 2.8   Urine Drug Screen: Drugs of Abuse     Component Value Date/Time   LABOPIA NONE DETECTED 02/04/2013 1450   COCAINSCRNUR POSITIVE* 02/04/2013 1450   COCAINSCRNUR NEG 09/21/2009 2031   LABBENZ NONE DETECTED 02/04/2013 1450   LABBENZ NEG 09/21/2009 2031   AMPHETMU NONE DETECTED 02/04/2013 1450   AMPHETMU NEG 09/21/2009 2031   THCU NONE DETECTED 02/04/2013 1450   LABBARB NONE DETECTED 02/04/2013 1450    Urinalysis:  Recent Labs Lab 02/04/13 1450  COLORURINE YELLOW  LABSPEC 1.022  PHURINE 6.0  GLUCOSEU 100*  HGBUR SMALL*  BILIRUBINUR NEGATIVE  KETONESUR 15*  PROTEINUR >300*  UROBILINOGEN 1.0  NITRITE NEGATIVE  LEUKOCYTESUR NEGATIVE    Studies/Results: Nm Myocar Multi W/spect W/wall Motion / Ef  02/05/2013   *RADIOLOGY REPORT*  Clinical Data:  Chest pain and hypertension.  History of stroke.  MYOCARDIAL IMAGING WITH SPECT (REST AND PHARMACOLOGIC-STRESS) GATED LEFT VENTRICULAR WALL MOTION STUDY LEFT VENTRICULAR EJECTION FRACTION  Technique:  Standard myocardial SPECT imaging was performed after resting intravenous injection of 10 mCi Tc-30m tetrofosmin. Subsequently, intravenous infusion of Lexiscan was  performed under the supervision of the Cardiology staff.  At peak effect of the drug, 30 mCi Tc-70m tetrofosmin was injected intravenously and standard myocardial SPECT  imaging was performed.  Quantitative gated imaging was also performed to evaluate left ventricular wall motion, and estimate left ventricular ejection fraction.  Comparison:  None.  Findings: Multiplanar SPECT imaging shows no fixed or reversible perfusion defect within the left ventricle myocardium.  The left ventricular cavity size appears grossly normal.  Images obtained with cardiac gating displayed using a surface rendering algorithm reveal no segmental wall motion abnormality.  Left ventricular end-diastolic volume is calculated at 78 ml.  The left  ventricular end systolic volume is calculated at 21 ml.  The derived left ventricular ejection fraction is 74%.  IMPRESSION: Normal exam.   Original Report Authenticated By: Misty Stanley, M.D.   Medications: I have reviewed the patient's current medications. Scheduled Meds: . diltiazem  60 mg Oral Q6H  . heparin subcutaneous  5,000 Units Subcutaneous Q8H  . LORazepam  1 mg Oral BID   Continuous Infusions:  PRN Meds:.hydrALAZINE, LORazepam, nitroGLYCERIN Assessment/Plan:  #. Chest pain-resolved  Patient is chest pain free now. She has ruled out ACS. The etiology is likely associated with her cocaine abuse. Patient is noted to have positive cocaine on all her UDS since 2010. Myoview normal per cardiology  - Appreciates Cardiology input!  - A1C pending--outpatient follow up. - Patient can be discharged today  #. Sinus tachycardia- carotid massage done by cardiologist at ED  Likely associated with her cocaine abuse  - Diltiazem po initiated by cardiology- will change it to extending release form.   - Ativan PRN   #. Cocaine abuse  - SW education  - avoid BB   #. HTN  Patient presents with elevated BP of 200's/100's on admission. She is noncompliant with her therapy. She has a clonidine bottle with fill date in 2012. We have contacted her pharmacy who reports to [place] the following refill history.  1. Clonidine # 30/month was refilled Jan, May and June 2014.  2. Norvasc Refill history was in Dec 2013 and Jan 2014. D/C'd by her Nephrologist per patient.  - The goal of blood pressure is ~ 160/100 for now ( suspect she has chronic higher BP due to noncompliance)  - Will D/C Clonidine given her medical noncompliance  - will not restart her norvasc either upon discharge.  - continue Diltiazem   # medical noncompliance  #. HLD  - not on statin  - LDL 89  #. CKD stage 5  - stable  - Patient follows up with Nephrology as an outpatient. - defer it to outpatient nephrology management    # VTE  Heparin SQ    Dispo: Disposition is deferred at this time, awaiting improvement of current medical problems.  Anticipated discharge today  The patient does have a current PCP Sid Falcon, MD) and does need an [facility] hospital follow-up appointment after discharge.  The patient does not have transportation limitations that hinder transportation to clinic appointments.  .Services Needed at time of discharge: Y = Yes, Blank = No PT:   OT:   RN:   Equipment:   Other:     LOS: 2 days   Charlann Lange, MD 02/06/2013, 1:54 PM

## 2013-02-06 NOTE — Progress Notes (Signed)
  Date: 02/06/2013  Patient name: Nancy [name]  Medical record number: [id number]  Date of birth: 07/23/1940   This patient has been seen and the plan of care was discussed with the house staff. Please see their note for complete details. I concur with their findings. Patient is ready for discharge since symptoms resolved and acute coronary syndrome has been ruled out.  Carlyle Basques, MD 02/06/2013, 2:58 PM

## 2013-02-06 NOTE — Discharge Summary (Signed)
Name: Nancy [name] MRN: [id number] DOB: December 16, 1940 72 y.o. PCP: Sid Falcon, MD  Date of Admission: 02/04/2013 12:53 PM Date of Discharge: 02/06/2013 Attending Physician: Dominic Pea, MD  Discharge Diagnosis: 1. Chest  pain 2. Sinus tachycardia 3. Cocaine abuse 4. Hypertension 5. Medical noncompliance 6. Hyperlipidemia 7. CKD (chronic kidney disease) stage 5, GFR less than 15 ml/min   Discharge Medications:   Medication List    STOP taking these medications       amLODipine 10 MG tablet  Commonly known as:  NORVASC     cloNIDine 0.1 MG tablet  Commonly known as:  CATAPRES      TAKE these medications       diltiazem 240 MG 24 hr capsule  Commonly known as:  CARTIA XT  Take 1 capsule (240 mg total) by mouth daily.     famotidine 40 MG tablet  Commonly known as:  PEPCID  take 1 tablet by mouth once daily     furosemide 40 MG tablet  Commonly known as:  LASIX  Take 40 mg by mouth daily.     Travoprost (BAK Free) 0.004 % Soln ophthalmic solution  Commonly known as:  TRAVATAN  Place 1 drop into both eyes at bedtime.        Disposition and follow-up:   [name] was discharged from [facility] in Stable condition.  At the hospital follow up visit please address:  1.  Please make sure that she stops cocaine abuse. Chest pain and sinus tachycardia are thought to be associated with cocaine abuse.  2.  Her home antihypertensive medications were DC'd since she was not taking them as outpatient.  I've confirmed with her pharmacy.   3. She was started on Cardizem CD 240 daily for blood pressure and heart rate control.  Ideally, benzodiazepine is an option for control of sinus tachycardia secondary to cocaine abuse.  However, her ongoing cocaine abuse and medical noncompliance makes her less optimal candidate for benzodiazepine as an outpatient.   4.  Avoid beta blocker.  5. she should continue followup with nephrology group for her  CKD.     Follow-up Appointments: Follow-up Information   Follow up with Gilles Chiquito, MD In 1 week.   Contact information:   [place] [address] [phone]       Discharge Instructions:  Future Appointments Provider Department Dept Phone   02/16/2013 10:30 AM Ivin Poot, MD [place] [phone]      Consultations: Treatment Team:  Rounding L[facility]cardiology, MD  Procedures Performed:  Dg Chest 2 View  02/04/2013   *RADIOLOGY REPORT*  Clinical Data: Shortness of breath  CHEST - 2 VIEW  Comparison:  August 17, 2012  Findings:  The lungs are clear.  Heart is upper normal in size with normal pulmonary vascularity.  No adenopathy.  No bone lesions.  IMPRESSION: No edema or consolidation.   Original Report Authenticated By: Lowella Grip, M.D.   Nm Myocar Multi W/spect W/wall Motion / Ef  02/05/2013   *RADIOLOGY REPORT*  Clinical Data:  Chest pain and hypertension.  History of stroke.  MYOCARDIAL IMAGING WITH SPECT (REST AND PHARMACOLOGIC-STRESS) GATED LEFT VENTRICULAR WALL MOTION STUDY LEFT VENTRICULAR EJECTION FRACTION  Technique:  Standard myocardial SPECT imaging was performed after resting intravenous injection of 10 mCi Tc-51m tetrofosmin. Subsequently, intravenous infusion of Lexiscan was performed under the supervision of the Cardiology staff.  At peak effect of the drug, 30  mCi Tc-15m tetrofosmin was injected intravenously and standard myocardial SPECT  imaging was performed.  Quantitative gated imaging was also performed to evaluate left ventricular wall motion, and estimate left ventricular ejection fraction.  Comparison:  None.  Findings: Multiplanar SPECT imaging shows no fixed or reversible perfusion defect within the left ventricle myocardium.  The left ventricular cavity size appears grossly normal.  Images obtained with cardiac gating displayed using a surface rendering algorithm reveal no segmental wall motion abnormality.  Left  ventricular end-diastolic volume is calculated at 78 ml.  The left ventricular end systolic volume is calculated at 21 ml.  The derived left ventricular ejection fraction is 74%.  IMPRESSION: Normal exam.   Original Report Authenticated By: Misty Stanley, M.D.    2D Echo: 02/05/13 ------------------------------------------------------------ Study Conclusions  - Left ventricle: The cavity size was normal. Wall thickness was increased in a pattern of mild LVH. Systolic function was normal. The estimated ejection fraction was in the range of 60% to 65%. Wall motion was normal; there were no regional wall motion abnormalities. Left ventricular diastolic function parameters were normal. - Aortic valve: There was mild stenosis. - Mitral valve: Calcified annulus. - Left atrium: The atrium was mildly dilated. - Pulmonary arteries: PA peak pressure: 21mm Hg (S). - Pericardium, extracardiac: A trivial pericardial effusion was identified posterior to the heart.  Myoview 02/05/13 Normal test  Admission HPI:  Ms. [name] is a 72 yo AAF with a PMH of CVA, HTN, ESRD, and hyperlipidemia. She presents to the ED with non-radiating substernal chest pain when she woke up this am. She describes the pain as dull, diffuse, 10/10, nonexertional, and it comes and goes. She admits to feeling some SOB, nausea, and vomited once this morning. Upon questioning, she confirms that she hasn't taken her BP medicine in about a week. She denies any additional symptoms. She has no known h/o CAD.  In the ED,she received Zofran, morphine, nitroglycerin, and aspirin. Of note, her UDS today was positive for cocaine. Cardiology saw the pt in the ED and added hydralazine 10mg  for HTN PRN and recommended adding ASA to her medications and continue to trend troponins.  Additionally, a TTE on 07/13/2012 showed EF: 65-70%.    Hospital Course by problem list: 1. Chest  Pain     Patient presented with nonradiating substernal chest pain on  admission.  UDS showed positive cocaine.  Cardiology was consulted. The ACS was ruled out with negative cardiac enzymes and EKG.  NM Myoview was normal on 02/05/13.  The etiology of her chest pain was likely associated with cocaine abuse.  Patient was instructed to stop cocaine and she agrees to do so.  An outpatient followup appointment was set up for her to be seen by Dr. Ivin Poot at 10:30 AM on 02/16/13.   2. Sinus tachycardia     Patient presents with sinus tachycardia, which was thought to be related to her cocaine abuse.  She was given benzodiazepine as needed during this admission.  Cardizem was initiated for blood pressure and heart rate control. Patient is discharged on Cardizem CD 240 mg daily.  Patient is not a good candidate for outpatient benzodiazepine treatment given her ongoing cocaine abuse and medical noncompliance.  3. Cocaine abuse - The complications of cocaine abuse and importance of cocaine cessation is discussed with patient. She is noted to have positive urine cocaine since 2010 (on current Epic data). I suspect that she may have had a longer history of cocaine abuse. She adamantly denied  that she was ever abusing cocaine initially on admission. I am concerned about her insight of this concept. She will need close follow up as an outpatient.   4. Hypertension     Patient presents with elevated BP of 200's/100's on admission. She is noncompliant with her therapy. She has a clonidine bottle with fill date in 2012. She does not have Norvasc or lasix bottles. We have contacted her pharmacy who reports to [place] the following refill history.  1).Clonidine # 30/month was refilled Jan, May and June 2014.  2). Norvasc Refill history was in Dec 2013 and Jan 2014. D/C'd by her Nephrologist per patient     Her Norvasc and Clonidine are D/C'd and we will start her on Cardizem 240 mg po daily for her BP and HR control. She will close follow up as an outpatient to ensure medication  compliance.  5. Medical noncompliance   See above discussion.   6. Hyperlipidemia    Need outpatient follow up and management  7. CKD (chronic kidney disease) stage 5, GFR less than 15 ml/min    She reports that she was referred to nephrology as an outpatient. Will defer it to PCP and her nephrology.   Discharge Vitals:   BP 183/73  Pulse 121  Temp(Src) 98.4 F (36.9 C) (Oral)  Resp 18  Ht 5\' 3"  (1.6 m)  Wt 185 lb 3 oz (84 kg)  BMI 32.81 kg/m2  SpO2 91%  Discharge Labs:  No results found for this or any previous visit (from the past 24 hour(s)).  Signed: Charlann Lange, MD 02/11/2013, 12:20 PM   Time Spent on Discharge: >30 minutes Services Ordered on Discharge: None Equipment Ordered on Discharge: None

## 2013-02-12 NOTE — Discharge Summary (Signed)
  Date: 02/12/2013  Patient name: Nancy [name]  Medical record number: [id number]  Date of birth: 10-Apr-1941   This patient has been discussed with the house staff. Please see their note for complete details. I concur with their findings and plan.  Dominic Pea, DO 02/12/2013, 9:34 AM

## 2013-02-12 NOTE — Discharge Summary (Signed)
  Date: 02/12/2013  Patient name: Nancy [name]  Medical record number: [id number]  Date of birth: 03-19-1941   This patient has been discussed with the house staff. Please see their note for complete details. I concur with their findings and plan.  Dominic Pea, DO 02/12/2013, 9:37 AM

## 2013-02-12 NOTE — Discharge Summary (Signed)
Name: Nancy [name] MRN: [id number] DOB: 21-Apr-1941 72 y.o. PCP: Sid Falcon, MD  Date of Admission: 02/04/2013 12:53 PM Date of Discharge: 02/06/2013 Attending Physician: Dominic Pea, MD  Discharge Diagnosis: 1. Chest  pain 2. Sinus tachycardia 3. Cocaine abuse 4. Hypertension 5. Medical noncompliance 6. Hyperlipidemia 7. CKD (chronic kidney disease) stage 5, GFR less than 15 ml/min   Discharge Medications:   Medication List    STOP taking these medications       amLODipine 10 MG tablet  Commonly known as:  NORVASC     cloNIDine 0.1 MG tablet  Commonly known as:  CATAPRES      TAKE these medications       diltiazem 240 MG 24 hr capsule  Commonly known as:  CARTIA XT  Take 1 capsule (240 mg total) by mouth daily.     famotidine 40 MG tablet  Commonly known as:  PEPCID  take 1 tablet by mouth once daily     furosemide 40 MG tablet  Commonly known as:  LASIX  Take 40 mg by mouth daily.     Travoprost (BAK Free) 0.004 % Soln ophthalmic solution  Commonly known as:  TRAVATAN  Place 1 drop into both eyes at bedtime.        Disposition and follow-up:   Nancy [name] was discharged from [facility] in Stable condition.  At the hospital follow up visit please address:  1.  Please make sure that she stops cocaine abuse. Chest pain and sinus tachycardia are thought to be associated with cocaine abuse.  2.  Her home antihypertensive medications were DC'd since she was not taking them as outpatient.  I've confirmed with her pharmacy.   3. She was started on Cardizem CD 240 daily for blood pressure and heart rate control.  Ideally, benzodiazepine is an option for control of sinus tachycardia secondary to cocaine abuse.  However, her ongoing cocaine abuse and medical noncompliance makes her less optimal candidate for benzodiazepine as an outpatient.   4.  Avoid beta blocker.  5. she should continue followup with nephrology group for her  CKD.     Follow-up Appointments: Follow-up Information   Follow up with Gilles Chiquito, MD In 1 week.   Contact information:   [place] [id number] [phone]       Discharge Instructions:  Future Appointments Provider Department Dept Phone   02/16/2013 10:30 AM Ivin Poot, MD [place] [phone]      Consultations: Treatment Team:  Rounding Lbcardiology, MD  Procedures Performed:  Dg Chest 2 View  02/04/2013   *RADIOLOGY REPORT*  Clinical Data: Shortness of breath  CHEST - 2 VIEW  Comparison:  August 17, 2012  Findings:  The lungs are clear.  Heart is upper normal in size with normal pulmonary vascularity.  No adenopathy.  No bone lesions.  IMPRESSION: No edema or consolidation.   Original Report Authenticated By: Lowella Grip, M.D.   Nm Myocar Multi W/spect W/wall Motion / Ef  02/05/2013   *RADIOLOGY REPORT*  Clinical Data:  Chest pain and hypertension.  History of stroke.  MYOCARDIAL IMAGING WITH SPECT (REST AND PHARMACOLOGIC-STRESS) GATED LEFT VENTRICULAR WALL MOTION STUDY LEFT VENTRICULAR EJECTION FRACTION  Technique:  Standard myocardial SPECT imaging was performed after resting intravenous injection of 10 mCi Tc-20m tetrofosmin. Subsequently, intravenous infusion of Lexiscan was performed under the supervision of the Cardiology staff.  At peak effect of the drug, 30  mCi Tc-35m tetrofosmin was injected intravenously and standard myocardial SPECT  imaging was performed.  Quantitative gated imaging was also performed to evaluate left ventricular wall motion, and estimate left ventricular ejection fraction.  Comparison:  None.  Findings: Multiplanar SPECT imaging shows no fixed or reversible perfusion defect within the left ventricle myocardium.  The left ventricular cavity size appears grossly normal.  Images obtained with cardiac gating displayed using a surface rendering algorithm reveal no segmental wall motion abnormality.  Left  ventricular end-diastolic volume is calculated at 78 ml.  The left ventricular end systolic volume is calculated at 21 ml.  The derived left ventricular ejection fraction is 74%.  IMPRESSION: Normal exam.   Original Report Authenticated By: Misty Stanley, M.D.    2D Echo: 02/05/13 ------------------------------------------------------------ Study Conclusions  - Left ventricle: The cavity size was normal. Wall thickness was increased in a pattern of mild LVH. Systolic function was normal. The estimated ejection fraction was in the range of 60% to 65%. Wall motion was normal; there were no regional wall motion abnormalities. Left ventricular diastolic function parameters were normal. - Aortic valve: There was mild stenosis. - Mitral valve: Calcified annulus. - Left atrium: The atrium was mildly dilated. - Pulmonary arteries: PA peak pressure: 69mm Hg (S). - Pericardium, extracardiac: A trivial pericardial effusion was identified posterior to the heart.  Myoview 02/05/13 Normal test  Admission HPI:  Nancy. [name] is a 72 yo AAF with a PMH of CVA, HTN, ESRD, and hyperlipidemia. She presents to the ED with non-radiating substernal chest pain when she woke up this am. She describes the pain as dull, diffuse, 10/10, nonexertional, and it comes and goes. She admits to feeling some SOB, nausea, and vomited once this morning. Upon questioning, she confirms that she hasn't taken her BP medicine in about a week. She denies any additional symptoms. She has no known h/o CAD.  In the ED,she received Zofran, morphine, nitroglycerin, and aspirin. Of note, her UDS today was positive for cocaine. Cardiology saw the pt in the ED and added hydralazine 10mg  for HTN PRN and recommended adding ASA to her medications and continue to trend troponins.  Additionally, a TTE on 07/13/2012 showed EF: 65-70%.    Hospital Course by problem list: 1. Chest  Pain     Patient presented with nonradiating substernal chest pain on  admission.  UDS showed positive cocaine.  Cardiology was consulted. The ACS was ruled out with negative cardiac enzymes and EKG.  NM Myoview was normal on 02/05/13.  The etiology of her chest pain was likely associated with cocaine abuse.  Patient was instructed to stop cocaine and she agrees to do so.  An outpatient followup appointment was set up for her to be seen by Dr. Ivin Poot at 10:30 AM on 02/16/13.   2. Sinus tachycardia     Patient presents with sinus tachycardia, which was thought to be related to her cocaine abuse.  She was given benzodiazepine as needed during this admission.  Cardizem was initiated for blood pressure and heart rate control. Patient is discharged on Cardizem CD 240 mg daily.  Patient is not a good candidate for outpatient benzodiazepine treatment given her ongoing cocaine abuse and medical noncompliance.  3. Cocaine abuse - The complications of cocaine abuse and importance of cocaine cessation is discussed with patient. She is noted to have positive urine cocaine since 2010 (on current Epic data). I suspect that she may have had a longer history of cocaine abuse. She adamantly denied  that she was ever abusing cocaine initially on admission. I am concerned about her insight of this concept. She will need close follow up as an outpatient.   4. Hypertension     Patient presents with elevated BP of 200's/100's on admission. She is noncompliant with her therapy. She has a clonidine bottle with fill date in 2012. She does not have Norvasc or lasix bottles. We have contacted her pharmacy who reports to [place] the following refill history.  1).Clonidine # 30/month was refilled Jan, May and June 2014.  2). Norvasc Refill history was in Dec 2013 and Jan 2014. D/C'd by her Nephrologist per patient     Her Norvasc and Clonidine are D/C'd and we will start her on Cardizem 240 mg po daily for her BP and HR control. She will close follow up as an outpatient to ensure medication  compliance.        She should continue her Lasix given her CKD stage 5. However, I suspect that she is not compliant. Will defer to her PCP for close follow up as an outpatient.  5. Medical noncompliance   See above discussion.   6. Hyperlipidemia    Need outpatient follow up and management  7. CKD (chronic kidney disease) stage 5, GFR less than 15 ml/min    She reports that she was referred to nephrology as an outpatient. Will defer it to PCP and her nephrology.   Discharge Vitals:   BP 183/73  Pulse 121  Temp(Src) 98.4 F (36.9 C) (Oral)  Resp 18  Ht 5\' 3"  (1.6 m)  Wt 185 lb 3 oz (84 kg)  BMI 32.81 kg/m2  SpO2 91%  Discharge Labs:  No results found for this or any previous visit (from the past 24 hour(s)).  Signed: Charlann Lange, MD 02/11/2013, 12:20 PM   Time Spent on Discharge: >30 minutes Services Ordered on Discharge: None Equipment Ordered on Discharge: None

## 2013-02-16 ENCOUNTER — Ambulatory Visit (INDEPENDENT_AMBULATORY_CARE_PROVIDER_SITE_OTHER): Payer: PRIVATE HEALTH INSURANCE | Admitting: Internal Medicine

## 2013-02-16 ENCOUNTER — Encounter: Payer: Self-pay | Admitting: Internal Medicine

## 2013-02-16 VITALS — BP 147/76 | HR 89 | Temp 97.5°F | Wt 191.7 lb

## 2013-02-16 DIAGNOSIS — I120 Hypertensive chronic kidney disease with stage 5 chronic kidney disease or end stage renal disease: Secondary | ICD-10-CM

## 2013-02-16 DIAGNOSIS — R079 Chest pain, unspecified: Secondary | ICD-10-CM

## 2013-02-16 DIAGNOSIS — I10 Essential (primary) hypertension: Secondary | ICD-10-CM

## 2013-02-16 DIAGNOSIS — R825 Elevated urine levels of drugs, medicaments and biological substances: Secondary | ICD-10-CM

## 2013-02-16 DIAGNOSIS — E785 Hyperlipidemia, unspecified: Secondary | ICD-10-CM

## 2013-02-16 DIAGNOSIS — N185 Chronic kidney disease, stage 5: Secondary | ICD-10-CM

## 2013-02-16 DIAGNOSIS — N184 Chronic kidney disease, stage 4 (severe): Secondary | ICD-10-CM

## 2013-02-16 DIAGNOSIS — R892 Abnormal level of other drugs, medicaments and biological substances in specimens from other organs, systems and tissues: Secondary | ICD-10-CM

## 2013-02-16 LAB — BASIC METABOLIC PANEL WITH GFR
Calcium: 8.6 mg/dL (ref 8.4–10.5)
GFR, Est African American: 15 mL/min — ABNORMAL LOW
GFR, Est Non African American: 13 mL/min — ABNORMAL LOW
Potassium: 4.7 mEq/L (ref 3.5–5.3)
Sodium: 138 mEq/L (ref 135–145)

## 2013-02-16 NOTE — Assessment & Plan Note (Addendum)
Pt recently admitted for chest pain due to cocaine use with + UDS.  Pt states she has abstained from cocaine use since discharge, no chest pain or palpitations today.  -continue diltiazem for rate control -consider repeat UDS at next visit -continue to encourage abstinence from cocaine

## 2013-02-16 NOTE — Assessment & Plan Note (Signed)
BP Readings from Last 3 Encounters:  02/16/13 147/76  02/06/13 183/73  08/18/12 161/70    Lab Results  Component Value Date   NA 140 02/06/2013   K 3.9 02/06/2013   CREATININE 4.70* 02/06/2013    Assessment: Improved from her discharge BP.  Blood pressure control: controlled Progress toward BP goal:  improved  Plan: Medications:  continue current medications Educational resources provided: brochure Self management tools provided: home blood pressure logbook Other plans: follow-up at next visit, consider adding medication if not controlled on diltiazem alone

## 2013-02-16 NOTE — Patient Instructions (Addendum)
Please follow-up with Dr. Daryll Drown.  Please remember to only take one of your fluid pills at night.   Please follow-up with your kidney doctor. This is very important since your kidneys are not functioning normally.  Remember, they are trying to help you and will not force you to do anything.   Diltiazem extended-release capsules or tablets What is this medicine? DILTIAZEM (dil TYE a zem) is a calcium-channel blocker. It affects the amount of calcium found in your heart and muscle cells. This relaxes your blood vessels, which can reduce the amount of work the heart has to do. This medicine is used to treat high blood pressure and chest pain caused by angina. This medicine may be used for other purposes; ask your health care provider or pharmacist if you have questions. What should I tell my health care provider before I take this medicine? They need to know if you have any of these conditions: -heart problems, low blood pressure, irregular heartbeat -liver disease -previous heart attack -an unusual or allergic reaction to diltiazem, other medicines, foods, dyes, or preservatives -pregnant or trying to get pregnant -breast-feeding How should I use this medicine? Take this medicine by mouth with a glass of water. Follow the directions on the prescription label. Swallow whole, do not crush or chew. Ask your doctor or pharmacist if your should take this medicine with food. Take your doses at regular intervals. Do not take your medicine more often then directed. Do not stop taking except on the advice of your doctor or health care professional. Ask your doctor or health care professional how to gradually reduce the dose. Talk to your pediatrician regarding the use of this medicine in children. Special care may be needed. Overdosage: If you think you have taken too much of this medicine contact a poison control center or emergency room at once. NOTE: This medicine is only for you. Do not share this  medicine with others. What if I miss a dose? If you miss a dose, take it as soon as you can. If it is almost time for your next dose, take only that dose. Do not take double or extra doses. What may interact with this medicine? Do not take this medicine with any of the following medications: -cisapride -hawthorn -pimozide -ranolazine -red yeast rice This medicine may also interact with the following medications: -buspirone -carbamazepine -cimetidine -cyclosporine -digoxin -local anesthetics or general anesthetics -lovastatin -medicines for anxiety or difficulty sleeping like midazolam and triazolam -medicines for high blood pressure or heart problems -quinidine -rifampin, rifabutin, or rifapentine This list may not describe all possible interactions. Give your health care provider a list of all the medicines, herbs, non-prescription drugs, or dietary supplements you use. Also tell them if you smoke, drink alcohol, or use illegal drugs. Some items may interact with your medicine. What should I watch for while using this medicine? Check your blood pressure and pulse rate regularly. Ask your doctor or health care professional what your blood pressure and pulse rate should be and when you should contact him or her. You may feel dizzy or lightheaded. Do not drive, use machinery, or do anything that needs mental alertness until you know how this medicine affects you. To reduce the risk of dizzy or fainting spells, do not sit or stand up quickly, especially if you are an older patient. Alcohol can make you more dizzy or increase flushing and rapid heartbeats. Avoid alcoholic drinks. What side effects may I notice from receiving this medicine? Side  effects that you should report to your doctor or health care professional as soon as possible: -allergic reactions like skin rash, itching or hives, swelling of the face, lips, or tongue -confusion, mental depression -feeling faint or lightheaded,  falls -redness, blistering, peeling or loosening of the skin, including inside the mouth -slow, irregular heartbeat -swelling of the feet and ankles -unusual bleeding or bruising, pinpoint red spots on the skin Side effects that usually do not require medical attention (report to your doctor or health care professional if they continue or are bothersome): -constipation or diarrhea -difficulty sleeping -facial flushing -headache -nausea, vomiting -sexual dysfunction -weak or tired This list may not describe all possible side effects. Call your doctor for medical advice about side effects. You may report side effects to FDA at 1-800-FDA-1088. Where should I keep my medicine? Keep out of the reach of children. Store at room temperature between 15 and 30 degrees C (59 and 86 degrees F). Protect from humidity. Throw away any unused medicine after the expiration date. NOTE: This sheet is a summary. It may not cover all possible information. If you have questions about this medicine, talk to your doctor, pharmacist, or health care provider.  2012, [employer]vier/Gold Standard. (10/15/2007 2:35:47 PM)

## 2013-02-16 NOTE — Assessment & Plan Note (Addendum)
Pt has detiorated from stage 4 to stage 5 CKD.  She was prescribed Lasix 40 mg daily by nephrologist but has been taking 80 mg daily. No LUE on exam. Pt was unhappy at her last visit at [facility] due to recommendation of vein mapping for future dialysis.    -BMP today -pt instructed to reduce dose of Lasix to 40 mg daily, as prescribed by nephrology -pt encouraged to follow-up at [place]

## 2013-02-16 NOTE — Progress Notes (Signed)
Patient ID: Nancy [name], female   DOB: 1940/08/25, 72 y.o.   MRN: [id number]   Subjective:   Patient ID: Nancy [name] female   DOB: 1941-02-19 72 y.o.   MRN: [id number]  HPI: Nancy [name] is a 72 y.o. woman with history of HTN, HL, CKD stage 5 who presents for hospital follow-up.  Pt was admitted on 7/31 for chest pain and sinus tachycardia with UDS + for cocaine; EKG and troponins negative, normal NM Myoview during admission.     Pt states she has been abstaining from cocaine since her hospital discharge a week and a half ago.  She is feeling well and has no complaints today.   Of note, pt has been taking twice her prescribed dose of Lasix, stating that she takes two of her fluid pills every night.  She follows with [facility] but states that she was not planning on returning since they requested that she go for vein mapping for future dialysis.  Pt is adamant about not wanting dialysis but upon asking her if her kidneys were to completely fail and dialysis was the only way for her to live, she says she would be amenable.  Follow-up with [facility] was strongly encouraged.   Pt's blood pressure medicines (amlodipine and clonidine) were discontinued at discharge from the hospital since she was not taking them in the outpatient setting.  Diltiazem was added to her discharge medications for rate control and hypertension. She states she is taking this medication daily as prescribed.    Past Medical History  Diagnosis Date  . CVA (cerebrovascular accident)     New hemorrhagic per CT scan '09  . Fecal impaction   . Diverticulosis of colon   . Hypertension   . Pulmonary nodule   . Dysfunctional uterine bleeding   . Postmenopausal   . OA (osteoarthritis)     bilateral knees  . Colitis   . Bacterial sinusitis 09/17/2011  . TINEA CRURIS 01/12/2007  . HERNIORRHAPHY, HX OF 08/11/2006  . CKD (chronic kidney disease) stage 4, GFR 15-29 ml/min 08/11/2006    Cr continues to increase.  Proteinuria on UA 02/10/12.     Current Outpatient Prescriptions  Medication Sig Dispense Refill  . diltiazem (CARTIA XT) 240 MG 24 hr capsule Take 1 capsule (240 mg total) by mouth daily.  30 capsule  0  . famotidine (PEPCID) 40 MG tablet take 1 tablet by mouth once daily  30 tablet  11  . furosemide (LASIX) 40 MG tablet Take 40 mg by mouth daily.      . Travoprost, BAK Free, (TRAVATAN) 0.004 % SOLN ophthalmic solution Place 1 drop into both eyes at bedtime.       No current facility-administered medications for this visit.   Family History  Problem Relation Age of Onset  . Hypertension Mother   . Heart attack Mother   . Heart disease Mother    History   Social History  . Marital Status: Married    Spouse Name: N/A    Number of Children: N/A  . Years of Education: N/A   Social History Main Topics  . Smoking status: Never Smoker   . Smokeless tobacco: Never Used  . Alcohol Use: No  . Drug Use: No     Comment: 08/15/08 UDS + cocaine  . Sexually Active: None   Other Topics Concern  . None   Social History Narrative   Married, lives with her husband. 1 child.  Review of Systems: Review of Systems  Constitutional: Negative for fever, chills and malaise/fatigue.  HENT: Negative for congestion.   Eyes: Negative for blurred vision.  Respiratory: Negative for cough and shortness of breath.   Cardiovascular: Negative for chest pain, palpitations and leg swelling.  Gastrointestinal: Negative for nausea, vomiting, abdominal pain, diarrhea and constipation.  Genitourinary: Negative for dysuria.  Musculoskeletal: Negative for falls.  Neurological: Negative for dizziness, tingling, focal weakness, loss of consciousness, weakness and headaches.    Objective:  Physical Exam: Filed Vitals:   02/16/13 1133  BP: 147/76  Pulse: 89  Temp: 97.5 F (36.4 C)  TempSrc: Oral  Weight: 191 lb 11.2 oz (86.955 kg)  SpO2: 99%   General: alert, cooperative, and in no apparent  distress HEENT: vision grossly intact, oropharynx clear and non-erythematous  Neck: supple, no lymphadenopathy, JVD, or carotid bruits Lungs: clear to ascultation bilaterally, normal work of respiration, no wheezes, rales, ronchi Heart: regular rate and rhythm, no murmurs, gallops, or rubs Abdomen: soft, non-tender, non-distended, normal bowel sounds Extremities: 2+ DP/PT pulses bilaterally, no cyanosis, clubbing, or edema Neurologic: alert & oriented X3, cranial nerves II-XII intact, strength grossly intact, sensation intact to light touch  Assessment & Plan:  Patient discussed with Dr. Marinda Elk.  Please see problem-based assessment and plan.

## 2013-02-16 NOTE — Assessment & Plan Note (Signed)
-   See chest pain above. 

## 2013-02-16 NOTE — Assessment & Plan Note (Signed)
LDL 89 on 02/06/13. No statin medication given that she is at goal (<100 given stage 5 CKD) without.

## 2013-02-17 NOTE — Progress Notes (Signed)
I saw and evaluated the patient.  I personally confirmed the key portions of the history and exam documented by Dr. Rogers and I reviewed pertinent patient test results.  The assessment, diagnosis, and plan were formulated together and I agree with the documentation in the resident's note. 

## 2013-04-08 ENCOUNTER — Other Ambulatory Visit: Payer: Self-pay | Admitting: *Deleted

## 2013-04-08 MED ORDER — DILTIAZEM HCL ER COATED BEADS 240 MG PO CP24: 240.0000 mg | ORAL_CAPSULE | Freq: Every day | ORAL | Status: DC

## 2013-04-12 NOTE — Telephone Encounter (Signed)
Called to pharm 

## 2013-04-30 NOTE — Progress Notes (Signed)
Pt knows where to come-take am meds light breakfast-bring meds and ins card id

## 2013-05-03 ENCOUNTER — Other Ambulatory Visit: Payer: Self-pay | Admitting: Internal Medicine

## 2013-05-03 DIAGNOSIS — Z1231 Encounter for screening mammogram for malignant neoplasm of breast: Secondary | ICD-10-CM

## 2013-05-04 ENCOUNTER — Other Ambulatory Visit: Payer: Self-pay | Admitting: Internal Medicine

## 2013-05-07 ENCOUNTER — Encounter: Payer: Self-pay | Admitting: Ophthalmology

## 2013-05-07 ENCOUNTER — Encounter (HOSPITAL_BASED_OUTPATIENT_CLINIC_OR_DEPARTMENT_OTHER): Payer: Self-pay

## 2013-05-07 ENCOUNTER — Encounter (HOSPITAL_BASED_OUTPATIENT_CLINIC_OR_DEPARTMENT_OTHER)
Admission: RE | Disposition: A | Discharge status: Home / Self Care | Payer: Self-pay | Source: Ambulatory Visit | Attending: Ophthalmology

## 2013-05-07 ENCOUNTER — Other Ambulatory Visit: Payer: Self-pay | Admitting: Ophthalmology

## 2013-05-07 ENCOUNTER — Ambulatory Visit (HOSPITAL_BASED_OUTPATIENT_CLINIC_OR_DEPARTMENT_OTHER)
Admission: RE | Admit: 2013-05-07 | Discharge: 2013-05-07 | Disposition: A | Discharge status: Home / Self Care | Payer: PRIVATE HEALTH INSURANCE | Source: Ambulatory Visit | Attending: Ophthalmology | Admitting: Ophthalmology

## 2013-05-07 DIAGNOSIS — H409 Unspecified glaucoma: Secondary | ICD-10-CM | POA: Insufficient documentation

## 2013-05-07 DIAGNOSIS — H11449 Conjunctival cysts, unspecified eye: Secondary | ICD-10-CM | POA: Insufficient documentation

## 2013-05-07 HISTORY — PX: MASS EXCISION: SHX2000

## 2013-05-07 SURGERY — MINOR EXCISION OF MASS
Anesthesia: LOCAL | Site: Eye | Laterality: Left | Wound class: Clean

## 2013-05-07 MED ORDER — BSS IO SOLN
INTRAOCULAR | Status: AC
  Filled 2013-05-07: qty 15

## 2013-05-07 MED ORDER — BSS IO SOLN
INTRAOCULAR | Status: DC | PRN
  Administered 2013-05-07: 15 mL via INTRAOCULAR

## 2013-05-07 MED ORDER — LIDOCAINE HCL 1 % IJ SOLN
INTRAMUSCULAR | Status: DC | PRN
  Administered 2013-05-07: 3 mL

## 2013-05-07 MED ORDER — NEOMYCIN-POLYMYXIN-DEXAMETH 3.5-10000-0.1 OP OINT
TOPICAL_OINTMENT | OPHTHALMIC | Status: DC | PRN
  Administered 2013-05-07: 1 via OPHTHALMIC

## 2013-05-07 MED ORDER — ACETAMINOPHEN 500 MG PO TABS
ORAL_TABLET | ORAL | Status: AC
  Filled 2013-05-07: qty 1

## 2013-05-07 MED ORDER — NEOMYCIN-POLYMYXIN-DEXAMETH 3.5-10000-0.1 OP OINT
TOPICAL_OINTMENT | OPHTHALMIC | Status: AC
  Filled 2013-05-07: qty 3.5

## 2013-05-07 MED ORDER — LIDOCAINE HCL (PF) 1 % IJ SOLN
INTRAMUSCULAR | Status: AC
  Filled 2013-05-07: qty 5

## 2013-05-07 SURGICAL SUPPLY — 16 items
APPLICATOR COTTON TIP 6IN STRL (MISCELLANEOUS) ×2 IMPLANT
BLADE SURG 11 STRL SS (BLADE) ×2 IMPLANT
CAUTERY EYE LOW TEMP 1300F FIN (OPHTHALMIC RELATED) ×1 IMPLANT
GAUZE SPONGE 4X4 12PLY STRL LF (GAUZE/BANDAGES/DRESSINGS) ×1 IMPLANT
GLOVE ECLIPSE 7.0 STRL STRAW (GLOVE) ×2 IMPLANT
GLOVE SURG SS PI 7.0 STRL IVOR (GLOVE) ×2 IMPLANT
MARKER SKIN DUAL TIP RULER LAB (MISCELLANEOUS) ×2 IMPLANT
NDL HYPO 25X5/8 SAFETYGLIDE (NEEDLE) ×1 IMPLANT
NEEDLE HYPO 25X5/8 SAFETYGLIDE (NEEDLE) ×2 IMPLANT
PAD ALCOHOL SWAB (MISCELLANEOUS) ×3 IMPLANT
PAD EYE OVAL STERILE LF (GAUZE/BANDAGES/DRESSINGS) ×4 IMPLANT
SPONGE GAUZE 4X4 12PLY (GAUZE/BANDAGES/DRESSINGS) ×1 IMPLANT
SUT SILK 6 0 P 1 (SUTURE) ×1 IMPLANT
SWABSTICK POVIDONE IODINE SNGL (MISCELLANEOUS) ×4 IMPLANT
SYR CONTROL 10ML LL (SYRINGE) ×2 IMPLANT
TOWEL OR 17X24 6PK STRL BLUE (TOWEL DISPOSABLE) ×2 IMPLANT

## 2013-05-07 NOTE — H&P (Signed)
  72 yo female has a cyst lateral canthus left eye which gets irritated.  This causes discomfort at times when she washes her face.  Admitted for cyst excision lower lid left eye.

## 2013-05-07 NOTE — Brief Op Note (Signed)
05/07/2013  8:29 AM  PATIENT:  Nancy [name]  72 y.o. female  PRE-OPERATIVE DIAGNOSIS:  Cyst  POST-OPERATIVE DIAGNOSIS:  * No post-op diagnosis entered *  PROCEDURE:  Procedure(s): EXCISION CYST (Left)  SURGEON:  Surgeon(s) and Role:    Myrtha Mantis., MD - Primary  PHYSICIAN ASSISTANT:   ASSISTANTS: none   ANESTHESIA:   local  EBL:     BLOOD ADMINISTERED:none  DRAINS: none   LOCAL MEDICATIONS USED:  XYLOCAINE   SPECIMEN:  Excision  DISPOSITION OF SPECIMEN:  PATHOLOGY  COUNTS:  YES  TOURNIQUET:  * No tourniquets in log *  DICTATION: .Other Dictation: Dictation Number [id number]  PLAN OF CARE: Discharge to home after PACU  PATIENT DISPOSITION:  Short Stay   Delay start of Pharmacological VTE agent (>24hrs) due to surgical blood loss or risk of bleeding: not applicable

## 2013-05-10 ENCOUNTER — Encounter (HOSPITAL_BASED_OUTPATIENT_CLINIC_OR_DEPARTMENT_OTHER): Payer: Self-pay | Admitting: Ophthalmology

## 2013-05-11 NOTE — Op Note (Signed)
NAME:  Nancy [name], Nancy [name]               ACCOUNT NO.:  [id number]  MEDICAL RECORD NO.:  [id number]  LOCATION:                               FACILITY:  [facility]  PHYSICIAN:  Garlan Fair., M.D.DATE OF BIRTH:  1940/08/11  DATE OF PROCEDURE:  05/10/2013 DATE OF DISCHARGE:  05/07/2013                              OPERATIVE REPORT   PREOPERATIVE DIAGNOSIS:  Cyst lateral canthus, left eye.  POSTOPERATIVE DIAGNOSIS:  Cyst lateral canthus, left eye.  OPERATION:  Excision of cyst, left eye.  ANESTHESIA:  Xylocaine 1%.  JUSTIFICATION FOR PROCEDURE:  This is a 72 year old lady with glaucoma who recently has begun to complain of a cyst along the lateral aspect of her lower lid of the left eye.  It was situated predominantly in the lateral canthus area.  The cyst  has seemed to increase in size and it gets irritated from time to time.  They cause her marked discomfort when she tries to wash her face.  She requested that the cyst be excised.  She is admitted at this time for that purpose.  PROCEDURE:  The patient was brought to the operating room, where she was prepped and draped in the usual manner.  The area immediately adjacent to the cyst in the lateral canthus area was infiltrated with several cc of Xylocaine.  The cyst was measured and found to be approximately 9 x 10 mm in greatest dimensions.  Then the lesion was outlined using a curvilinear incision made with a Bard-Parker blade.  Then using careful sharp and blunt dissection, the lesion was excised in toto using scissors.  Hemostasis was achieved by using cautery.  The skin was then reapproximated by using 2 interrupted sutures of 6-0 silk.  Polysporin ointment was applied to the suture site.  A pressure patch was applied.  The patient was discharged in satisfactory condition, with instructions to remove the patch tomorrow, to continue to use the Polysporin ophthalmic ointment, and to see me in the office in 1 week.  DISCHARGE DIAGNOSIS:   Cyst lateral canthus, left eye.     Garlan Fair., M.D.     TB/MEDQ  D:  05/10/2013  T:  05/11/2013  Job:  [id number]

## 2013-05-11 NOTE — Op Note (Deleted)
NAME:  Nancy [name], Nancy [name]               ACCOUNT NO.:  [id number]  MEDICAL RECORD NO.:  [id number]  LOCATION:                               FACILITY:  [facility]  PHYSICIAN:  Garlan Fair., M.D.DATE OF BIRTH:  1941-04-01  DATE OF PROCEDURE:  05/10/2013 DATE OF DISCHARGE:  05/07/2013                              OPERATIVE REPORT   PREOPERATIVE DIAGNOSIS:  Cyst lateral canthus, left eye.  POSTOPERATIVE DIAGNOSIS:  Cyst lateral canthus, left eye.  OPERATION:  Excision of cyst, left eye.  ANESTHESIA:  Xylocaine 1%.  JUSTIFICATION FOR PROCEDURE:  This is a 72 year old lady with glaucoma who recently has begun to complain of a cyst along the lateral aspect of her lower lid of the left eye.  It was situated predominantly in the lateral canthus area.  The cyst  has seemed to increase in size and it gets irritated from time to time.  They cause her marked discomfort when she tries to wash her face.  She requested that the cyst be excised __________.  JUSTIFICATION FOR PROCEDURE:  The patient was brought to the operating room, where she was prepped and draped in the usual manner.  The area immediately adjacent to the cyst in the lateral canthus area was infiltrated with several cc of Xylocaine.  The cyst was measured and found to be approximately 9 x 10 mm in greatest dimensions.  Today, the lesion was outlined using a curvilinear incision made with a Bard-Parker blade.  Then using careful sharp and blunt dissection, the lesion was excised in toto using scissors.  Hemostasis was achieved by using cautery.  The skin was then reapproximated by using 2 interrupted sutures of 6-0 silk.  __________ was applied to the suture site.  A pressure patch was applied.  The patient was discharged in satisfactory condition, with instructions to remove the patch tomorrow, to continue to use the Polysporin ophthalmic ointment, and to see me in the office in 1 week.  DISCHARGE DIAGNOSIS:  Cyst lateral  canthus, left eye.     Garlan Fair., M.D.     TB/MEDQ  D:  05/10/2013  T:  05/11/2013  Job:  [id number]

## 2013-05-19 ENCOUNTER — Ambulatory Visit (HOSPITAL_COMMUNITY): Payer: PRIVATE HEALTH INSURANCE | Attending: Internal Medicine

## 2013-06-15 ENCOUNTER — Ambulatory Visit (HOSPITAL_COMMUNITY): Payer: PRIVATE HEALTH INSURANCE | Attending: Internal Medicine

## 2013-07-13 ENCOUNTER — Emergency Department (HOSPITAL_COMMUNITY)
Admission: EM | Admit: 2013-07-13 | Discharge: 2013-07-13 | Disposition: A | Discharge status: Home / Self Care | Payer: PRIVATE HEALTH INSURANCE | Attending: Emergency Medicine | Admitting: Emergency Medicine

## 2013-07-13 ENCOUNTER — Emergency Department (HOSPITAL_COMMUNITY): Payer: PRIVATE HEALTH INSURANCE

## 2013-07-13 ENCOUNTER — Encounter (HOSPITAL_COMMUNITY): Payer: Self-pay | Admitting: Emergency Medicine

## 2013-07-13 DIAGNOSIS — Z8719 Personal history of other diseases of the digestive system: Secondary | ICD-10-CM | POA: Diagnosis not present

## 2013-07-13 DIAGNOSIS — Z8742 Personal history of other diseases of the female genital tract: Secondary | ICD-10-CM | POA: Insufficient documentation

## 2013-07-13 DIAGNOSIS — Z8619 Personal history of other infectious and parasitic diseases: Secondary | ICD-10-CM | POA: Insufficient documentation

## 2013-07-13 DIAGNOSIS — R059 Cough, unspecified: Secondary | ICD-10-CM | POA: Insufficient documentation

## 2013-07-13 DIAGNOSIS — Z79899 Other long term (current) drug therapy: Secondary | ICD-10-CM | POA: Diagnosis not present

## 2013-07-13 DIAGNOSIS — N184 Chronic kidney disease, stage 4 (severe): Secondary | ICD-10-CM | POA: Diagnosis not present

## 2013-07-13 DIAGNOSIS — Z9089 Acquired absence of other organs: Secondary | ICD-10-CM | POA: Insufficient documentation

## 2013-07-13 DIAGNOSIS — R002 Palpitations: Secondary | ICD-10-CM | POA: Diagnosis not present

## 2013-07-13 DIAGNOSIS — R05 Cough: Secondary | ICD-10-CM | POA: Insufficient documentation

## 2013-07-13 DIAGNOSIS — I10 Essential (primary) hypertension: Secondary | ICD-10-CM | POA: Insufficient documentation

## 2013-07-13 DIAGNOSIS — R112 Nausea with vomiting, unspecified: Secondary | ICD-10-CM | POA: Insufficient documentation

## 2013-07-13 DIAGNOSIS — Z8673 Personal history of transient ischemic attack (TIA), and cerebral infarction without residual deficits: Secondary | ICD-10-CM | POA: Insufficient documentation

## 2013-07-13 DIAGNOSIS — Z8739 Personal history of other diseases of the musculoskeletal system and connective tissue: Secondary | ICD-10-CM | POA: Diagnosis not present

## 2013-07-13 DIAGNOSIS — Z8709 Personal history of other diseases of the respiratory system: Secondary | ICD-10-CM | POA: Diagnosis not present

## 2013-07-13 DIAGNOSIS — R079 Chest pain, unspecified: Secondary | ICD-10-CM | POA: Diagnosis present

## 2013-07-13 LAB — CBC WITH DIFFERENTIAL/PLATELET
BASOS ABS: 0 10*3/uL (ref 0.0–0.1)
Basophils Relative: 0 % (ref 0–1)
EOS ABS: 0 10*3/uL (ref 0.0–0.7)
EOS PCT: 0 % (ref 0–5)
HCT: 37.3 % (ref 36.0–46.0)
Hemoglobin: 12.6 g/dL (ref 12.0–15.0)
Lymphocytes Relative: 11 % — ABNORMAL LOW (ref 12–46)
Lymphs Abs: 0.8 10*3/uL (ref 0.7–4.0)
MCH: 29.8 pg (ref 26.0–34.0)
MCHC: 33.8 g/dL (ref 30.0–36.0)
MCV: 88.2 fL (ref 78.0–100.0)
MONO ABS: 0.1 10*3/uL (ref 0.1–1.0)
Monocytes Relative: 2 % — ABNORMAL LOW (ref 3–12)
Neutro Abs: 6.5 10*3/uL (ref 1.7–7.7)
Neutrophils Relative %: 87 % — ABNORMAL HIGH (ref 43–77)
PLATELETS: 219 10*3/uL (ref 150–400)
RBC: 4.23 MIL/uL (ref 3.87–5.11)
RDW: 14.2 % (ref 11.5–15.5)
WBC: 7.5 10*3/uL (ref 4.0–10.5)

## 2013-07-13 LAB — POCT I-STAT TROPONIN I
TROPONIN I, POC: 0.01 ng/mL (ref 0.00–0.08)
Troponin i, poc: 0 ng/mL (ref 0.00–0.08)

## 2013-07-13 LAB — BASIC METABOLIC PANEL
BUN: 36 mg/dL — AB (ref 6–23)
CALCIUM: 9.4 mg/dL (ref 8.4–10.5)
CO2: 19 meq/L (ref 19–32)
CREATININE: 4.21 mg/dL — AB (ref 0.50–1.10)
Chloride: 105 mEq/L (ref 96–112)
GFR calc Af Amer: 11 mL/min — ABNORMAL LOW (ref >90)
GFR calc non Af Amer: 10 mL/min — ABNORMAL LOW (ref >90)
GLUCOSE: 137 mg/dL — AB (ref 70–99)
Potassium: 4.5 mEq/L (ref 3.7–5.3)
Sodium: 142 mEq/L (ref 137–147)

## 2013-07-13 MED ORDER — ONDANSETRON HCL 4 MG/2ML IJ SOLN
4.0000 mg | Freq: Once | INTRAMUSCULAR | Status: AC
  Administered 2013-07-13: 4 mg via INTRAVENOUS
  Filled 2013-07-13: qty 2

## 2013-07-13 MED ORDER — ONDANSETRON 4 MG PO TBDP: 4.0000 mg | ORAL_TABLET | Freq: Three times a day (TID) | ORAL | Status: DC | PRN

## 2013-07-13 NOTE — ED Provider Notes (Signed)
CSN: [id number]     Arrival date & time 07/13/13  [id number] History   First MD Initiated Contact with Patient 07/13/13 2107     Chief Complaint  Patient presents with  . Chest Pain   HPI  73 y/o female with history as noted below who presents with cc of nausea and vomiting. Symptoms began on Sunday with a cough. Has had a productive cough and several episodes of non-bloody non-bilious vomiting. The patient's husband has had similar symptoms. The patient called EMS this evening after an episode of vomiting. She states that when she vomited she felt a "tingling" in her chest. Per triage note the patient had been complaining of intermittent chest pain since last night that worsened over the two hours prior to her calling EMS. She currently denies chest pain and adamantly denies having any chest pain at all. She states that it only felt like her heart was "racing" shortly after vomiting. The palpations lasted for a few seconds before resolving. Denies alcohol or drug use.   Past Medical History  Diagnosis Date  . CVA (cerebrovascular accident)     New hemorrhagic per CT scan '09  . Fecal impaction   . Diverticulosis of colon   . Hypertension   . Pulmonary nodule   . Dysfunctional uterine bleeding   . Postmenopausal   . OA (osteoarthritis)     bilateral knees  . Colitis   . Bacterial sinusitis 09/17/2011  . TINEA CRURIS 01/12/2007  . HERNIORRHAPHY, HX OF 08/11/2006  . CKD (chronic kidney disease) stage 4, GFR 15-29 ml/min 08/11/2006    Cr continues to increase. Proteinuria on UA 02/10/12.     Past Surgical History  Procedure Laterality Date  . Inguinal hernia repair  2008  . Abdominal hysterectomy    . Cholecystectomy  2009  . Iridotomy / iridectomy      Laser, right eye 12/26/11 left eye 01/24/12  . Mass excision Left 05/07/2013    Procedure: EXCISION CYST;  Surgeon: Myrtha Mantis., MD;  Location: [facility];  Service: Ophthalmology;  Laterality: Left;   Family History   Problem Relation Age of Onset  . Hypertension Mother   . Heart attack Mother   . Heart disease Mother    History  Substance Use Topics  . Smoking status: Never Smoker   . Smokeless tobacco: Never Used  . Alcohol Use: No   OB History   Grav Para Term Preterm Abortions TAB SAB Ect Mult Living                 Review of Systems  Constitutional: Negative for fever and chills.  HENT: Negative for congestion.   Respiratory: Positive for cough. Negative for shortness of breath.   Cardiovascular: Positive for palpitations. Negative for chest pain.  Gastrointestinal: Positive for nausea and vomiting. Negative for abdominal pain.  Genitourinary: Negative for dysuria and frequency.  Neurological: Negative for weakness, numbness and headaches.  All other systems reviewed and are negative.    Allergies  Hydrocodone-acetaminophen  Home Medications   Current Outpatient Rx  Name  Route  Sig  Dispense  Refill  . diltiazem (CARTIA XT) 240 MG 24 hr capsule   Oral   Take 1 capsule (240 mg total) by mouth daily.   90 capsule   3   . famotidine (PEPCID) 40 MG tablet      take 1 tablet by mouth once daily   30 tablet   11   . furosemide (  LASIX) 40 MG tablet   Oral   Take 40 mg by mouth daily.         . Travoprost, BAK Free, (TRAVATAN) 0.004 % SOLN ophthalmic solution   Both Eyes   Place 1 drop into both eyes at bedtime.         . ondansetron (ZOFRAN ODT) 4 MG disintegrating tablet   Oral   Take 1 tablet (4 mg total) by mouth every 8 (eight) hours as needed for nausea or vomiting. 4mg  ODT q4 hours prn nausea/vomit   6 tablet   0    BP 203/90  Pulse 92  Temp(Src) 99 F (37.2 C) (Oral)  Resp 17  Ht 5\' 3"  (1.6 m)  Wt 199 lb (90.266 kg)  BMI 35.26 kg/m2  SpO2 99% Physical Exam  Nursing note and vitals reviewed. Constitutional: She is oriented to person, place, and time. She appears well-developed and well-nourished. No distress.  HENT:  Head: Normocephalic and  atraumatic.  Eyes: Conjunctivae are normal. Pupils are equal, round, and reactive to light.  Neck: Normal range of motion. Neck supple.  Cardiovascular: Normal rate and regular rhythm.  Exam reveals no gallop and no friction rub.   No murmur heard. Pulses:      Radial pulses are 2+ on the right side, and 2+ on the left side.  Pulmonary/Chest: Effort normal and breath sounds normal.  Abdominal: Soft. She exhibits no distension. There is no tenderness.  Musculoskeletal: Normal range of motion. She exhibits no edema and no tenderness.  Neurological: She is alert and oriented to person, place, and time. She has normal strength and normal reflexes. No cranial nerve deficit or sensory deficit.  Skin: Skin is warm and dry.  Psychiatric: She has a normal mood and affect.    ED Course  Procedures (including critical care time) Labs Review Labs Reviewed  BASIC METABOLIC PANEL - Abnormal; Notable for the following:    Glucose, Bld 137 (*)    BUN 36 (*)    Creatinine, Ser 4.21 (*)    GFR calc non Af Amer 10 (*)    GFR calc Af Amer 11 (*)    All other components within normal limits  CBC WITH DIFFERENTIAL - Abnormal; Notable for the following:    Neutrophils Relative % 87 (*)    Lymphocytes Relative 11 (*)    Monocytes Relative 2 (*)    All other components within normal limits  POCT I-STAT TROPONIN I  POCT I-STAT TROPONIN I   Imaging Review Dg Chest Portable 1 View  07/13/2013   CLINICAL DATA:  Cough and chest pain  EXAM: PORTABLE CHEST - 1 VIEW  COMPARISON:  February 04, 2013  FINDINGS: Lungs are clear. Heart size and pulmonary vascularity are normal. No adenopathy. No pneumothorax. No bone lesions.  IMPRESSION: No abnormality noted.   Electronically Signed   By: Lowella Grip M.D.   On: 07/13/2013 19:43    EKG Interpretation    Date/Time:  Tuesday July 13 2013 22:57:05 EST Ventricular Rate:  94 PR Interval:  143 QRS Duration: 101 QT Interval:  367 QTC Calculation: 459 R  Axis:   37 Text Interpretation:  Sinus rhythm Left atrial enlargement LVH with secondary repolarization abnormality Confirmed by Christy Gentles  MD, DONALD [phone]) on 07/13/2013 11:04:58 PM           MDM   1. Cough   2. Nausea and vomiting   3. Palpitations    73 y/o female with history as noted  below who presents with cc of nausea and vomiting. Adamantly denies chest pain. EKG without acute ischemic changes. Repeat EKG performed without dynamic changes. Delta troponin negative. Doubt ACS. Doubt PE. Doubt Dissection. CXR without evidence of pneumonia. Likely viral bronchitis as cause of the cough. CBC and BMP are within normal limits. Blood pressure elevated, pt with known hypertension. Currently asymptomatic hypertension. No vomiting here. Tolerated PO prior to discharge. The patient was discharged home with a script for zofran. Follow up with pcp. Return precautions given and discussed with the patient who was in agreement with plan.     Donita Brooks, MD 07/14/13 [phone]

## 2013-07-13 NOTE — ED Notes (Signed)
Pt reporting intermittent cp since last night worsening over last 2 hours. Central CP, with radiation to jaw earlier. 9.5/10. Diaphoretic upon arrival. Pt comes from home. Reports aspirin allergy, EMS could not get IV. Anterior elevation in inferior leads. BP 210/100, HR 95.

## 2013-07-13 NOTE — ED Notes (Signed)
MD Bringolf notified of pts BP 212/88.

## 2013-07-13 NOTE — ED Notes (Signed)
Pt given d/c instructions and verbalized understanding. NAD at this time.  

## 2013-07-14 NOTE — ED Provider Notes (Signed)
I have personally seen and examined the patient.  I have discussed the plan of care with the resident.  I have reviewed the documentation on PMH/FH/Soc. History.  I have reviewed the documentation of the resident and agree.  I have reviewed and agree with the ECG interpretation(s) documented by the resident.  Pt well appearing, reports most of her concern was recent cough, no distress noted, stable for d/c home  Sharyon Cable, MD 07/14/13 1818

## 2013-07-20 ENCOUNTER — Emergency Department (HOSPITAL_COMMUNITY)
Admission: EM | Admit: 2013-07-20 | Discharge: 2013-07-20 | Disposition: A | Discharge status: Home / Self Care | Payer: PRIVATE HEALTH INSURANCE | Attending: Emergency Medicine | Admitting: Emergency Medicine

## 2013-07-20 ENCOUNTER — Emergency Department (HOSPITAL_COMMUNITY): Payer: PRIVATE HEALTH INSURANCE

## 2013-07-20 ENCOUNTER — Encounter (HOSPITAL_COMMUNITY): Payer: Self-pay | Admitting: Emergency Medicine

## 2013-07-20 DIAGNOSIS — R1110 Vomiting, unspecified: Secondary | ICD-10-CM

## 2013-07-20 DIAGNOSIS — R911 Solitary pulmonary nodule: Secondary | ICD-10-CM | POA: Insufficient documentation

## 2013-07-20 DIAGNOSIS — Z8673 Personal history of transient ischemic attack (TIA), and cerebral infarction without residual deficits: Secondary | ICD-10-CM | POA: Insufficient documentation

## 2013-07-20 DIAGNOSIS — Z8742 Personal history of other diseases of the female genital tract: Secondary | ICD-10-CM | POA: Insufficient documentation

## 2013-07-20 DIAGNOSIS — R197 Diarrhea, unspecified: Secondary | ICD-10-CM | POA: Insufficient documentation

## 2013-07-20 DIAGNOSIS — R05 Cough: Secondary | ICD-10-CM | POA: Insufficient documentation

## 2013-07-20 DIAGNOSIS — Z79899 Other long term (current) drug therapy: Secondary | ICD-10-CM | POA: Insufficient documentation

## 2013-07-20 DIAGNOSIS — Z9889 Other specified postprocedural states: Secondary | ICD-10-CM | POA: Insufficient documentation

## 2013-07-20 DIAGNOSIS — M1990 Unspecified osteoarthritis, unspecified site: Secondary | ICD-10-CM | POA: Insufficient documentation

## 2013-07-20 DIAGNOSIS — Z885 Allergy status to narcotic agent status: Secondary | ICD-10-CM | POA: Insufficient documentation

## 2013-07-20 DIAGNOSIS — I129 Hypertensive chronic kidney disease with stage 1 through stage 4 chronic kidney disease, or unspecified chronic kidney disease: Secondary | ICD-10-CM | POA: Insufficient documentation

## 2013-07-20 DIAGNOSIS — R059 Cough, unspecified: Secondary | ICD-10-CM | POA: Insufficient documentation

## 2013-07-20 DIAGNOSIS — Z780 Asymptomatic menopausal state: Secondary | ICD-10-CM | POA: Insufficient documentation

## 2013-07-20 DIAGNOSIS — N184 Chronic kidney disease, stage 4 (severe): Secondary | ICD-10-CM | POA: Insufficient documentation

## 2013-07-20 DIAGNOSIS — R112 Nausea with vomiting, unspecified: Secondary | ICD-10-CM | POA: Insufficient documentation

## 2013-07-20 DIAGNOSIS — Z8719 Personal history of other diseases of the digestive system: Secondary | ICD-10-CM | POA: Insufficient documentation

## 2013-07-20 LAB — BASIC METABOLIC PANEL
BUN: 59 mg/dL — ABNORMAL HIGH (ref 6–23)
CO2: 24 mEq/L (ref 19–32)
Calcium: 8.7 mg/dL (ref 8.4–10.5)
Chloride: 100 mEq/L (ref 96–112)
Creatinine, Ser: 5.27 mg/dL — ABNORMAL HIGH (ref 0.50–1.10)
GFR calc Af Amer: 9 mL/min — ABNORMAL LOW (ref >90)
GFR, EST NON AFRICAN AMERICAN: 7 mL/min — AB (ref >90)
Glucose, Bld: 136 mg/dL — ABNORMAL HIGH (ref 70–99)
Potassium: 4.7 mEq/L (ref 3.7–5.3)
SODIUM: 137 meq/L (ref 137–147)

## 2013-07-20 LAB — POCT I-STAT TROPONIN I: TROPONIN I, POC: 0.01 ng/mL (ref 0.00–0.08)

## 2013-07-20 LAB — CBC
HCT: 35 % — ABNORMAL LOW (ref 36.0–46.0)
Hemoglobin: 11.9 g/dL — ABNORMAL LOW (ref 12.0–15.0)
MCH: 29.2 pg (ref 26.0–34.0)
MCHC: 34 g/dL (ref 30.0–36.0)
MCV: 86 fL (ref 78.0–100.0)
PLATELETS: 203 10*3/uL (ref 150–400)
RBC: 4.07 MIL/uL (ref 3.87–5.11)
RDW: 13.8 % (ref 11.5–15.5)
WBC: 7.8 10*3/uL (ref 4.0–10.5)

## 2013-07-20 MED ORDER — BENZONATATE 100 MG PO CAPS
200.0000 mg | ORAL_CAPSULE | Freq: Once | ORAL | Status: AC
  Administered 2013-07-20: 200 mg via ORAL
  Filled 2013-07-20: qty 2

## 2013-07-20 MED ORDER — ONDANSETRON HCL 4 MG/2ML IJ SOLN
4.0000 mg | Freq: Once | INTRAMUSCULAR | Status: AC
  Administered 2013-07-20: 4 mg via INTRAVENOUS
  Filled 2013-07-20: qty 2

## 2013-07-20 MED ORDER — ONDANSETRON 4 MG PO TBDP: 4.0000 mg | ORAL_TABLET | Freq: Three times a day (TID) | ORAL | Status: DC | PRN

## 2013-07-20 MED ORDER — BENZONATATE 100 MG PO CAPS: 100.0000 mg | ORAL_CAPSULE | Freq: Three times a day (TID) | ORAL | Status: DC

## 2013-07-20 NOTE — Discharge Instructions (Signed)
Nausea and Vomiting °Nausea means you feel sick to your stomach. Throwing up (vomiting) is a reflex where stomach contents come out of your mouth. °HOME CARE  °· Take medicine as told by your doctor. °· Do not force yourself to eat. However, you do need to drink fluids. °· If you feel like eating, eat a normal diet as told by your doctor. °· Eat rice, wheat, potatoes, bread, lean meats, yogurt, fruits, and vegetables. °· Avoid high-fat foods. °· Drink enough fluids to keep your pee (urine) clear or pale yellow. °· Ask your doctor how to replace body fluid losses (rehydrate). Signs of body fluid loss (dehydration) include: °· Feeling very thirsty. °· Dry lips and mouth. °· Feeling dizzy. °· Dark pee. °· Peeing less than normal. °· Feeling confused. °· Fast breathing or heart rate. °GET HELP RIGHT AWAY IF:  °· You have blood in your throw up. °· You have black or bloody poop (stool). °· You have a bad headache or stiff neck. °· You feel confused. °· You have bad belly (abdominal) pain. °· You have chest pain or trouble breathing. °· You do not pee at least once every 8 hours. °· You have cold, clammy skin. °· You keep throwing up after 24 to 48 hours. °· You have a fever. °MAKE SURE YOU:  °· Understand these instructions. °· Will watch your condition. °· Will get help right away if you are not doing well or get worse. °Document Released: 12/11/2007 Document Revised: 09/16/2011 Document Reviewed: 11/23/2010 °[employer]® Patient Information ©2014 [employer]. ° °

## 2013-07-20 NOTE — ED Provider Notes (Signed)
CSN: [id number]     Arrival date & time 07/20/13  1332 History   First MD Initiated Contact with Patient 07/20/13 1449     Chief Complaint  Patient presents with  . Chest Pain  . Emesis  . Cough    HPI  Patient presents with a one-day illness.  Seen and evaluated about one week ago with cough and nausea..2 be a viral syndrome.  Symptoms improved after 24 hours.  She's had 5 days of feeling well.  Started coughing yesterday.  Develop some cough this morning and a few episodes of posttussive emesis.  Only one episode.  One loose stool.  No fever.  No blood in her stool.  No blood in her emesis  Past Medical History  Diagnosis Date  . CVA (cerebrovascular accident)     New hemorrhagic per CT scan '09  . Fecal impaction   . Diverticulosis of colon   . Hypertension   . Pulmonary nodule   . Dysfunctional uterine bleeding   . Postmenopausal   . OA (osteoarthritis)     bilateral knees  . Colitis   . Bacterial sinusitis 09/17/2011  . TINEA CRURIS 01/12/2007  . HERNIORRHAPHY, HX OF 08/11/2006  . CKD (chronic kidney disease) stage 4, GFR 15-29 ml/min 08/11/2006    Cr continues to increase. Proteinuria on UA 02/10/12.     Past Surgical History  Procedure Laterality Date  . Inguinal hernia repair  2008  . Abdominal hysterectomy    . Cholecystectomy  2009  . Iridotomy / iridectomy      Laser, right eye 12/26/11 left eye 01/24/12  . Mass excision Left 05/07/2013    Procedure: EXCISION CYST;  Surgeon: Myrtha Mantis., MD;  Location: [facility];  Service: Ophthalmology;  Laterality: Left;   Family History  Problem Relation Age of Onset  . Hypertension Mother   . Heart attack Mother   . Heart disease Mother    History  Substance Use Topics  . Smoking status: Never Smoker   . Smokeless tobacco: Never Used  . Alcohol Use: No   OB History   Grav Para Term Preterm Abortions TAB SAB Ect Mult Living                 Review of Systems  Constitutional: Negative for fever,  chills, diaphoresis, appetite change and fatigue.  HENT: Negative for mouth sores, sore throat and trouble swallowing.   Eyes: Negative for visual disturbance.  Respiratory: Positive for cough. Negative for chest tightness, shortness of breath and wheezing.   Cardiovascular: Negative for chest pain.  Gastrointestinal: Positive for nausea, vomiting and diarrhea. Negative for abdominal pain and abdominal distention.  Endocrine: Negative for polydipsia, polyphagia and polyuria.  Genitourinary: Negative for dysuria, frequency and hematuria.  Musculoskeletal: Negative for gait problem.  Skin: Negative for color change, pallor and rash.  Neurological: Negative for dizziness, syncope, light-headedness and headaches.  Hematological: Does not bruise/bleed easily.  Psychiatric/Behavioral: Negative for behavioral problems and confusion.    Allergies  Hydrocodone-acetaminophen  Home Medications   Current Outpatient Rx  Name  Route  Sig  Dispense  Refill  . diltiazem (CARTIA XT) 240 MG 24 hr capsule   Oral   Take 1 capsule (240 mg total) by mouth daily.   90 capsule   3   . famotidine (PEPCID) 40 MG tablet      take 1 tablet by mouth once daily   30 tablet   11   . Travoprost,  BAK Free, (TRAVATAN) 0.004 % SOLN ophthalmic solution   Both Eyes   Place 1-2 drops into both eyes 2 (two) times daily. 2 drops in am, 1 drop in pm         . benzonatate (TESSALON) 100 MG capsule   Oral   Take 1 capsule (100 mg total) by mouth every 8 (eight) hours.   21 capsule   0   . ondansetron (ZOFRAN ODT) 4 MG disintegrating tablet   Oral   Take 1 tablet (4 mg total) by mouth every 8 (eight) hours as needed for nausea.   6 tablet   0    BP 189/84  Pulse 88  Temp(Src) 97.7 F (36.5 C) (Oral)  Resp 20  SpO2 98% Physical Exam  Constitutional: She is oriented to person, place, and time. She appears well-developed and well-nourished. No distress.  HENT:  Head: Normocephalic.  Eyes:  Conjunctivae are normal. Pupils are equal, round, and reactive to light. No scleral icterus.  Neck: Normal range of motion. Neck supple. No thyromegaly present.  Cardiovascular: Normal rate and regular rhythm.  Exam reveals no gallop and no friction rub.   No murmur heard. Pulmonary/Chest: Effort normal and breath sounds normal. No respiratory distress. She has no wheezes. She has no rales.  Clear lungs.  No increased work of breathing.  No wheezing, rales, or prolongation.  Abdominal: Soft. Bowel sounds are normal. She exhibits no distension. There is no tenderness. There is no rebound.  Soft.  Nondistended.  Normoactive bowel sounds.  Musculoskeletal: Normal range of motion.  Neurological: She is alert and oriented to person, place, and time.  Skin: Skin is warm and dry. No rash noted.  Psychiatric: She has a normal mood and affect. Her behavior is normal.    ED Course  Procedures (including critical care time) Labs Review Labs Reviewed  CBC - Abnormal; Notable for the following:    Hemoglobin 11.9 (*)    HCT 35.0 (*)    All other components within normal limits  BASIC METABOLIC PANEL - Abnormal; Notable for the following:    Glucose, Bld 136 (*)    BUN 59 (*)    Creatinine, Ser 5.27 (*)    GFR calc non Af Amer 7 (*)    GFR calc Af Amer 9 (*)    All other components within normal limits  POCT I-STAT TROPONIN I   Imaging Review No results found.  EKG Interpretation    Date/Time:  Tuesday July 20 2013 13:39:42 EST Ventricular Rate:  94 PR Interval:  131 QRS Duration: 105 QT Interval:  376 QTC Calculation: 470 R Axis:   15 Text Interpretation:  Sinus rhythm Left atrial enlargement LVH with secondary repolarization abnormality Anterior ST elevation, probably due to LVH No significant change was found Confirmed by CAMPOS  MD, KEVIN ([id number]) on 07/20/2013 4:21:36 PM            MDM   1. Vomiting      Her creatinine has slightly above his baseline at 5.2.  Follows  with nephrology.  She states she's been aware that her kidney function has been declining.  I don't feel this represents an acute kidney injury.  She only one episode of nausea and vomiting.  She's hydrated clinically here.  She is taking by mouth and she is perfectly appropriate for outpatient treatment and follow up with her primary care, and her nephrology, to continue her previous plan for care for deteriorating renal function.  She does  not appear to be in congestive heart failure.  She does not have edema.  She has a normal potassium.  She feels well and is anxious to be discharged home.  She understood our discussion regarding her kidney function  Tanna Furry, MD 07/20/13 1821

## 2013-07-20 NOTE — ED Notes (Addendum)
Pt arrives via EMS from home with intermittent sharp, stabbing,  substernal chest pain that awoke her around 4am. Pt began vomting shortly after and has vomited multiple times this AM. She states nothing stays down. Denies fever. Pt with white productive cough. Pt received 1 nitro, no ASA, contraindicated per kidney doctor. Skin warm, dry. Pt NAD at present.

## 2013-07-20 NOTE — ED Notes (Signed)
Cab voucher provided for transportation.

## 2013-07-20 NOTE — ED Notes (Signed)
Talked to social work about getting a cab voucher to go home per request from pt/family.

## 2013-07-20 NOTE — ED Notes (Signed)
Pt. Ambulated to restroom with no problems. Gait steady. Reports some dizziness upon standing which resolved.

## 2013-07-26 ENCOUNTER — Emergency Department (HOSPITAL_COMMUNITY): Payer: PRIVATE HEALTH INSURANCE

## 2013-07-26 ENCOUNTER — Emergency Department (HOSPITAL_COMMUNITY)
Admission: EM | Admit: 2013-07-26 | Discharge: 2013-07-26 | Disposition: A | Discharge status: Home / Self Care | Payer: PRIVATE HEALTH INSURANCE | Attending: Emergency Medicine | Admitting: Emergency Medicine

## 2013-07-26 ENCOUNTER — Encounter (HOSPITAL_COMMUNITY): Payer: Self-pay | Admitting: Emergency Medicine

## 2013-07-26 DIAGNOSIS — R61 Generalized hyperhidrosis: Secondary | ICD-10-CM | POA: Insufficient documentation

## 2013-07-26 DIAGNOSIS — R112 Nausea with vomiting, unspecified: Secondary | ICD-10-CM | POA: Insufficient documentation

## 2013-07-26 DIAGNOSIS — R143 Flatulence: Secondary | ICD-10-CM

## 2013-07-26 DIAGNOSIS — Z780 Asymptomatic menopausal state: Secondary | ICD-10-CM | POA: Insufficient documentation

## 2013-07-26 DIAGNOSIS — Z885 Allergy status to narcotic agent status: Secondary | ICD-10-CM | POA: Insufficient documentation

## 2013-07-26 DIAGNOSIS — Z8719 Personal history of other diseases of the digestive system: Secondary | ICD-10-CM | POA: Insufficient documentation

## 2013-07-26 DIAGNOSIS — Z8673 Personal history of transient ischemic attack (TIA), and cerebral infarction without residual deficits: Secondary | ICD-10-CM | POA: Insufficient documentation

## 2013-07-26 DIAGNOSIS — K5900 Constipation, unspecified: Secondary | ICD-10-CM | POA: Insufficient documentation

## 2013-07-26 DIAGNOSIS — R141 Gas pain: Secondary | ICD-10-CM | POA: Insufficient documentation

## 2013-07-26 DIAGNOSIS — Z9889 Other specified postprocedural states: Secondary | ICD-10-CM | POA: Insufficient documentation

## 2013-07-26 DIAGNOSIS — R911 Solitary pulmonary nodule: Secondary | ICD-10-CM | POA: Insufficient documentation

## 2013-07-26 DIAGNOSIS — R000 Tachycardia, unspecified: Secondary | ICD-10-CM | POA: Insufficient documentation

## 2013-07-26 DIAGNOSIS — Z79899 Other long term (current) drug therapy: Secondary | ICD-10-CM | POA: Insufficient documentation

## 2013-07-26 DIAGNOSIS — M1990 Unspecified osteoarthritis, unspecified site: Secondary | ICD-10-CM | POA: Insufficient documentation

## 2013-07-26 DIAGNOSIS — R109 Unspecified abdominal pain: Secondary | ICD-10-CM

## 2013-07-26 DIAGNOSIS — R142 Eructation: Secondary | ICD-10-CM | POA: Insufficient documentation

## 2013-07-26 DIAGNOSIS — I129 Hypertensive chronic kidney disease with stage 1 through stage 4 chronic kidney disease, or unspecified chronic kidney disease: Secondary | ICD-10-CM | POA: Insufficient documentation

## 2013-07-26 DIAGNOSIS — N184 Chronic kidney disease, stage 4 (severe): Secondary | ICD-10-CM | POA: Insufficient documentation

## 2013-07-26 DIAGNOSIS — Z8742 Personal history of other diseases of the female genital tract: Secondary | ICD-10-CM | POA: Insufficient documentation

## 2013-07-26 LAB — CBC WITH DIFFERENTIAL/PLATELET
BASOS PCT: 0 % (ref 0–1)
Basophils Absolute: 0 10*3/uL (ref 0.0–0.1)
EOS ABS: 0 10*3/uL (ref 0.0–0.7)
Eosinophils Relative: 0 % (ref 0–5)
HEMATOCRIT: 33 % — AB (ref 36.0–46.0)
Hemoglobin: 11.4 g/dL — ABNORMAL LOW (ref 12.0–15.0)
Lymphocytes Relative: 20 % (ref 12–46)
Lymphs Abs: 1.1 10*3/uL (ref 0.7–4.0)
MCH: 29.8 pg (ref 26.0–34.0)
MCHC: 34.5 g/dL (ref 30.0–36.0)
MCV: 86.2 fL (ref 78.0–100.0)
MONO ABS: 0.3 10*3/uL (ref 0.1–1.0)
Monocytes Relative: 6 % (ref 3–12)
NEUTROS ABS: 4 10*3/uL (ref 1.7–7.7)
NEUTROS PCT: 74 % (ref 43–77)
Platelets: 258 10*3/uL (ref 150–400)
RBC: 3.83 MIL/uL — ABNORMAL LOW (ref 3.87–5.11)
RDW: 13.7 % (ref 11.5–15.5)
WBC: 5.5 10*3/uL (ref 4.0–10.5)

## 2013-07-26 LAB — URINALYSIS, ROUTINE W REFLEX MICROSCOPIC
BILIRUBIN URINE: NEGATIVE
Glucose, UA: NEGATIVE mg/dL
KETONES UR: NEGATIVE mg/dL
Leukocytes, UA: NEGATIVE
Nitrite: NEGATIVE
Specific Gravity, Urine: >1.03 — ABNORMAL HIGH (ref 1.005–1.030)
UROBILINOGEN UA: 0.2 mg/dL (ref 0.0–1.0)
pH: 5.5 (ref 5.0–8.0)

## 2013-07-26 LAB — COMPREHENSIVE METABOLIC PANEL
ALT: 6 U/L (ref 0–35)
AST: 10 U/L (ref 0–37)
Albumin: 3.1 g/dL — ABNORMAL LOW (ref 3.5–5.2)
Alkaline Phosphatase: 44 U/L (ref 39–117)
BUN: 38 mg/dL — AB (ref 6–23)
CALCIUM: 8.5 mg/dL (ref 8.4–10.5)
CO2: 23 meq/L (ref 19–32)
CREATININE: 4.6 mg/dL — AB (ref 0.50–1.10)
Chloride: 99 mEq/L (ref 96–112)
GFR calc Af Amer: 10 mL/min — ABNORMAL LOW (ref >90)
GFR, EST NON AFRICAN AMERICAN: 9 mL/min — AB (ref >90)
GLUCOSE: 114 mg/dL — AB (ref 70–99)
Potassium: 4.3 mEq/L (ref 3.7–5.3)
Sodium: 135 mEq/L — ABNORMAL LOW (ref 137–147)
TOTAL PROTEIN: 6.1 g/dL (ref 6.0–8.3)
Total Bilirubin: 0.3 mg/dL (ref 0.3–1.2)

## 2013-07-26 LAB — URINE MICROSCOPIC-ADD ON

## 2013-07-26 LAB — RAPID URINE DRUG SCREEN, HOSP PERFORMED
Amphetamines: NOT DETECTED
BARBITURATES: NOT DETECTED
Benzodiazepines: NOT DETECTED
Cocaine: NOT DETECTED
Opiates: NOT DETECTED
TETRAHYDROCANNABINOL: NOT DETECTED

## 2013-07-26 LAB — OCCULT BLOOD, POC DEVICE: FECAL OCCULT BLD: NEGATIVE

## 2013-07-26 LAB — LACTIC ACID, PLASMA: Lactic Acid, Venous: 0.8 mmol/L (ref 0.5–2.2)

## 2013-07-26 MED ORDER — SODIUM CHLORIDE 0.9 % IV BOLUS (SEPSIS)
500.0000 mL | Freq: Once | INTRAVENOUS | Status: AC
Start: 2013-07-26 — End: 2013-07-26
  Administered 2013-07-26: 500 mL via INTRAVENOUS

## 2013-07-26 MED ORDER — ONDANSETRON 4 MG PO TBDP: 4.0000 mg | ORAL_TABLET | Freq: Three times a day (TID) | ORAL | Status: DC | PRN

## 2013-07-26 MED ORDER — ONDANSETRON HCL 4 MG/2ML IJ SOLN
4.0000 mg | Freq: Once | INTRAMUSCULAR | Status: AC
  Administered 2013-07-26: 4 mg via INTRAVENOUS
  Filled 2013-07-26: qty 2

## 2013-07-26 MED ORDER — IOHEXOL 300 MG/ML  SOLN
25.0000 mL | INTRAMUSCULAR | Status: AC
  Administered 2013-07-26: 25 mL via ORAL

## 2013-07-26 NOTE — ED Provider Notes (Signed)
CSN: [id number]     Arrival date & time 07/26/13  1452 History   First MD Initiated Contact with Patient 07/26/13 1509     Chief Complaint  Patient presents with  . Abdominal Pain   (Consider location/radiation/quality/duration/timing/severity/associated sxs/prior Treatment) Patient is a 73 y.o. female presenting with abdominal pain. The history is provided by the patient.  Abdominal Pain Pain location:  Suprapubic Pain quality: cramping, sharp, shooting and squeezing   Pain radiates to:  Does not radiate Pain severity:  Severe Onset quality:  Sudden Duration:  12 hours Timing:  Constant Progression:  Unchanged Chronicity:  New Context: not alcohol use, not eating, not medication withdrawal and not recent sexual activity   Relieved by:  Nothing Worsened by:  Nothing tried Ineffective treatments:  None tried Associated symptoms: constipation (x3 weeks), flatus and vomiting   Associated symptoms: no anorexia, no chest pain, no chills, no dysuria, no fatigue, no fever, no hematemesis, no nausea, no shortness of breath and no vaginal discharge   Risk factors: being elderly   Risk factors: no alcohol abuse and has not had multiple surgeries    Patient is a 73 year old female with a chief complaint of suprapubic abdominal pain this morning around 4 AM. She woke up from sleep with it. Feels like she is having a baby, crampy, comes and goes.  Last BM per patient was three weeks ago.      Past Medical History  Diagnosis Date  . CVA (cerebrovascular accident)     New hemorrhagic per CT scan '09  . Fecal impaction   . Diverticulosis of colon   . Hypertension   . Pulmonary nodule   . Dysfunctional uterine bleeding   . Postmenopausal   . OA (osteoarthritis)     bilateral knees  . Colitis   . Bacterial sinusitis 09/17/2011  . TINEA CRURIS 01/12/2007  . HERNIORRHAPHY, HX OF 08/11/2006  . CKD (chronic kidney disease) stage 4, GFR 15-29 ml/min 08/11/2006    Cr continues to increase.  Proteinuria on UA 02/10/12.     Past Surgical History  Procedure Laterality Date  . Inguinal hernia repair  2008  . Abdominal hysterectomy    . Cholecystectomy  2009  . Iridotomy / iridectomy      Laser, right eye 12/26/11 left eye 01/24/12  . Mass excision Left 05/07/2013    Procedure: EXCISION CYST;  Surgeon: Myrtha Mantis., MD;  Location: [place];  Service: Ophthalmology;  Laterality: Left;   Family History  Problem Relation Age of Onset  . Hypertension Mother   . Heart attack Mother   . Heart disease Mother    History  Substance Use Topics  . Smoking status: Never Smoker   . Smokeless tobacco: Never Used  . Alcohol Use: No   OB History   Grav Para Term Preterm Abortions TAB SAB Ect Mult Living                 Review of Systems  Constitutional: Negative for fever, chills and fatigue.  HENT: Negative for congestion and rhinorrhea.   Eyes: Negative for redness and visual disturbance.  Respiratory: Negative for shortness of breath and wheezing.   Cardiovascular: Negative for chest pain and palpitations.  Gastrointestinal: Positive for vomiting, abdominal pain, constipation (x3 weeks) and flatus. Negative for nausea, anorexia and hematemesis.  Genitourinary: Negative for dysuria, urgency and vaginal discharge.  Musculoskeletal: Negative for arthralgias and myalgias.  Skin: Negative for pallor and wound.  Neurological: Negative for  dizziness and headaches.    Allergies  Hydrocodone-acetaminophen  Home Medications   Current Outpatient Rx  Name  Route  Sig  Dispense  Refill  . acetaminophen (TYLENOL) 500 MG tablet   Oral   Take 500 mg by mouth every 6 (six) hours as needed.         . diltiazem (CARTIA XT) 240 MG 24 hr capsule   Oral   Take 1 capsule (240 mg total) by mouth daily.   90 capsule   3   . famotidine (PEPCID) 40 MG tablet      take 1 tablet by mouth once daily   30 tablet   11   . ondansetron (ZOFRAN ODT) 4 MG  disintegrating tablet   Oral   Take 1 tablet (4 mg total) by mouth every 8 (eight) hours as needed for nausea.   6 tablet   0   . Travoprost, BAK Free, (TRAVATAN) 0.004 % SOLN ophthalmic solution   Both Eyes   Place 1-2 drops into both eyes 2 (two) times daily. 2 drops in am, 1 drop in pm         . benzonatate (TESSALON) 100 MG capsule   Oral   Take 1 capsule (100 mg total) by mouth every 8 (eight) hours.   21 capsule   0   . ondansetron (ZOFRAN ODT) 4 MG disintegrating tablet   Oral   Take 1 tablet (4 mg total) by mouth every 8 (eight) hours as needed for nausea or vomiting.   10 tablet   0    BP 204/86  Pulse 106  Temp(Src) 98.4 F (36.9 C) (Oral)  Resp 17  Ht 5\' 3"  (1.6 m)  Wt 190 lb (86.183 kg)  BMI 33.67 kg/m2  SpO2 99% Physical Exam  Constitutional: She is oriented to person, place, and time. She appears well-developed and well-nourished. No distress.  HENT:  Head: Normocephalic and atraumatic.  Eyes: EOM are normal. Pupils are equal, round, and reactive to light.  Neck: Normal range of motion. Neck supple.  Cardiovascular: Normal rate and regular rhythm.  Exam reveals no gallop and no friction rub.   No murmur heard. Pulmonary/Chest: Effort normal. She has no wheezes. She has no rales.  Abdominal: Soft. She exhibits no distension. There is tenderness (mild subjective tenderness suprapubic). There is no rebound.  Genitourinary: Rectum normal. Guaiac negative stool.  Hard stool in vault, minimal, not impacted  Musculoskeletal: She exhibits no edema and no tenderness.  Neurological: She is alert and oriented to person, place, and time.  Skin: Skin is warm and dry. She is not diaphoretic.  Psychiatric: She has a normal mood and affect. Her behavior is normal.    ED Course  Procedures (including critical care time) Labs Review Labs Reviewed  CBC WITH DIFFERENTIAL - Abnormal; Notable for the following:    RBC 3.83 (*)    Hemoglobin 11.4 (*)    HCT 33.0 (*)     All other components within normal limits  URINALYSIS, ROUTINE W REFLEX MICROSCOPIC - Abnormal; Notable for the following:    APPearance HAZY (*)    Specific Gravity, Urine >1.030 (*)    Hgb urine dipstick SMALL (*)    Protein, ur >300 (*)    All other components within normal limits  COMPREHENSIVE METABOLIC PANEL - Abnormal; Notable for the following:    Sodium 135 (*)    Glucose, Bld 114 (*)    BUN 38 (*)    Creatinine, Ser 4.60 (*)  Albumin 3.1 (*)    GFR calc non Af Amer 9 (*)    GFR calc Af Amer 10 (*)    All other components within normal limits  URINE MICROSCOPIC-ADD ON - Abnormal; Notable for the following:    Squamous Epithelial / LPF FEW (*)    Bacteria, UA FEW (*)    Casts HYALINE CASTS (*)    All other components within normal limits  URINE CULTURE  URINE RAPID DRUG SCREEN (HOSP PERFORMED)  LACTIC ACID, PLASMA  OCCULT BLOOD, POC DEVICE   Imaging Review Ct Abdomen Pelvis Wo Contrast  07/26/2013   CLINICAL DATA:  Diffuse abdominal pain, nausea and vomiting  EXAM: CT ABDOMEN AND PELVIS WITHOUT CONTRAST  TECHNIQUE: Multidetector CT imaging of the abdomen and pelvis was performed following the standard protocol without intravenous contrast.  COMPARISON:  CT abdomen pelvis - 07/13/2012  FINDINGS: The lack of intravenous contrast limits the ability to evaluate solid abdominal organs.  Normal hepatic contour.  Post cholecystectomy.  No ascites.  Normal noncontrast appearance of the bilateral kidneys. No definite renal stones. No stones are seen along the expected course of either ureter or the urinary bladder. Normal noncontrast appearance of the urinary bladder given under distention. The urinary obstruction or perinephric stranding. Normal noncontrast appearance of the bilateral adrenal glands and spleen. The pancreas is largely fatty replaced.  Ingestion enteric contrast extends to the level of the hepatic flexure of the colon. Moderate colonic stool burden without evidence of  obstruction. Colonic diverticulosis without evidence of diverticulitis. Small hiatal hernia. Normal noncontrast appearance of the appendix. No pneumoperitoneum, pneumatosis or portal venous gas.  Scattered minimal atherosclerotic plaque within a normal caliber abdominal aorta. No bulky retroperitoneal, mesenteric, pelvic or inguinal lymphadenopathy on this noncontrast examination.  A degenerating partially calcified fibroid is noted within the fundus of the uterus. No discrete adnexal lesion on this noncontrast examination. No free fluid within the pelvis.  Limited visualization of the lower thorax demonstrates grossly unchanged punctate (5 and 4 mm) nodules within the imaged right lower lobe (both nodule seen on image 5, series 3) grossly unchanged since the 08/2008 examination and thus of benign etiology. No pleural effusion. No focal airspace opacities.  Normal heart size. Trace amount of pericardial fluid, likely physiologic. Calcifications within the aortic valve leaflets and mitral valve annulus. Coronary artery calcifications.  No acute or aggressive osseus abnormalities. Mild-to-moderate multilevel lumbar spine DDD, likely worse at L5-S1 with disc space height loss, endplate irregularity and sclerosis. Regional soft tissues appear normal.  IMPRESSION: 1. No explanation for patient's diffuse abdominal pain, nausea and vomiting. Specifically, no evidence of nephrolithiasis, urinary or enteric obstruction. Normal noncontrast appearance of the appendix. 2. Colonic diverticulosis without definite evidence of diverticulitis on this noncontrast examination. 3. Small hiatal hernia. 4. Degenerating partially calcified fibroid within the fundus of the uterus. 5. Punctate nodules within the imaged right lower lobe, unchanged since the 2010 examination and breast of benign etiology.   Electronically Signed   By: Sandi Mariscal M.D.   On: 07/26/2013 20:53   Dg Abd 1 View  07/26/2013   CLINICAL DATA:  Umbilical pain,  emesis  EXAM: ABDOMEN - 1 VIEW  COMPARISON:  Prior radiograph from 08/17/2012  FINDINGS: Gaseous worsened scattered within serve nondilated loops of bowel within the abdomen. No evidence of obstruction or ileus. No free intraperitoneal air. Calcified fibroid overlies the mid pelvis. No soft tissue mass.  Degenerative changes noted within the visualized spine. Cholecystectomy clips overlie the right upper  quadrant.  IMPRESSION: No radiographic evidence of acute intra-abdominal process. Nonobstructive bowel gas pattern.   Electronically Signed   By: Jeannine Boga M.D.   On: 07/26/2013 17:14    EKG Interpretation    Date/Time:  Monday July 26 2013 15:07:51 EST Ventricular Rate:  100 PR Interval:  140 QRS Duration: 98 QT Interval:  350 QTC Calculation: 451 R Axis:   20 Text Interpretation:  Sinus tachycardia Probable left atrial enlargement Nonspecific repol abnormality, diffuse leads Borderline ST elevation, anterior leads When compared with ECG of 07/20/2013, No significant change was found Confirmed by [facility]  MD, DAVID ([id number]) on 07/26/2013 3:18:21 PM            MDM   1. Nausea and vomiting   2. Abdominal pain      The patient is a 73 year old female with a significant past medical history of cocaine abuse. Patient comes in with a chief complaint of suprapubic abdominal tenderness. Patient states that this started abruptly this morning. Residual her out of sleep around 4 AM. Patient denies any dysuria as stated she has been constipated for the past 3 weeks. Patient with some vomiting today some nausea. Patient denies any fevers or chills. Patient denies any blood in her urine blood in her stool.  On exam patient with no stool in the vault. Patient with mild suprapubic tenderness. Patient with unremarkable KUB. Obtain CT scan which showed no explanation for the patient's diffuse abdominal pain. His abdominal pain improved with narcotics and fluids. We'll discharge the patient home  she'll follow up with her PCP.   I have discussed the diagnosis/risks/treatment options with the patient and family and believe the pt to be eligible for discharge home to follow-up with PCP. We also discussed returning to the ED immediately if new or worsening sx occur. We discussed the sx which are most concerning (e.g., sudden worsening abdominal pain.) that necessitate immediate return. Any new prescriptions provided to the patient are listed below.  Discharge Medication List as of 07/26/2013  9:21 PM    START taking these medications   Details  !! ondansetron (ZOFRAN ODT) 4 MG disintegrating tablet Take 1 tablet (4 mg total) by mouth every 8 (eight) hours as needed for nausea or vomiting., Starting 07/26/2013, Until Discontinued, Print     !! - Potential duplicate medications found. Please discuss with provider.        Deno Etienne, MD 07/27/13 [phone]

## 2013-07-26 NOTE — ED Notes (Signed)
Pt updated on plan of care.  Another warm blanket given    Husband remains at bedside.

## 2013-07-26 NOTE — ED Notes (Signed)
PER EMS pt reported abd pain and nausea and vomiting, 10/10 pain,

## 2013-07-26 NOTE — ED Notes (Signed)
CT made aware of pt drinking contrast.

## 2013-07-26 NOTE — ED Notes (Signed)
Patient transported to CT 

## 2013-07-26 NOTE — Discharge Instructions (Signed)
Abdominal Pain, Adult °Many things can cause abdominal pain. Usually, abdominal pain is not caused by a disease and will improve without treatment. It can often be observed and treated at home. Your health care provider will do a physical exam and possibly order blood tests and X-rays to help determine the seriousness of your pain. However, in many cases, more time must pass before a clear cause of the pain can be found. Before that point, your health care provider may not know if you need more testing or further treatment. °HOME CARE INSTRUCTIONS  °Monitor your abdominal pain for any changes. The following actions may help to alleviate any discomfort you are experiencing: °· Only take over-the-counter or prescription medicines as directed by your health care provider. °· Do not take laxatives unless directed to do so by your health care provider. °· Try a clear liquid diet (broth, tea, or water) as directed by your health care provider. Slowly move to a bland diet as tolerated. °SEEK MEDICAL CARE IF: °· You have unexplained abdominal pain. °· You have abdominal pain associated with nausea or diarrhea. °· You have pain when you urinate or have a bowel movement. °· You experience abdominal pain that wakes you in the night. °· You have abdominal pain that is worsened or improved by eating food. °· You have abdominal pain that is worsened with eating fatty foods. °SEEK IMMEDIATE MEDICAL CARE IF:  °· Your pain does not go away within 2 hours. °· You have a fever. °· You keep throwing up (vomiting). °· Your pain is felt only in portions of the abdomen, such as the right side or the left lower portion of the abdomen. °· You pass bloody or black tarry stools. °MAKE SURE YOU: °· Understand these instructions.   °· Will watch your condition.   °· Will get help right away if you are not doing well or get worse.   °Document Released: 04/03/2005 Document Revised: 04/14/2013 Document Reviewed: 03/03/2013 °[employer]® Patient  Information ©2014 [employer]. ° °

## 2013-07-26 NOTE — ED Notes (Signed)
Pt ambulatory to bathroom without any problems 

## 2013-07-26 NOTE — ED Notes (Signed)
Pt ambulatory to bathroom without any problems.  Husband remains at bedside.

## 2013-07-26 NOTE — ED Provider Notes (Signed)
73 year old female comes in with onset last night of severe pain across the lower abdomen without radiation. Associated nausea but no vomiting. She has had some sweats but no chills. She denies fever but husband states that she did have some fever. I just states that she has not had a bowel movement last 3 weeks. Pain is somewhat better after passing flatus but nothing makes it any worse. She does not recall having had pain like this before. On exam, lungs are clear, heart has regular rate and rhythm. Abdomen is soft with mild to moderate tenderness across lower abdomen. No rebound or guarding. There is no CVA tenderness. Presentation is suspicious for diverticulitis. Workup has been initiated with laboratory evaluation and plain abdominal films. Will consider CT if diagnosis is not clear after above-noted testing.  I saw and evaluated the patient, reviewed the resident's note and I agree with the findings and plan.  EKG Interpretation    Date/Time:  Monday July 26 2013 15:07:51 EST Ventricular Rate:  100 PR Interval:  140 QRS Duration: 98 QT Interval:  350 QTC Calculation: 451 R Axis:   20 Text Interpretation:  Sinus tachycardia Probable left atrial enlargement Nonspecific repol abnormality, diffuse leads Borderline ST elevation, anterior leads When compared with ECG of 07/20/2013, No significant change was found Confirmed by Roxanne Mins  MD, Jeramey Lanuza ([id number]) on 07/26/2013 3:18:21 PM              Delora Fuel, MD [id number]

## 2013-07-26 NOTE — ED Notes (Signed)
Pt st's she is unable to drink contrast.  St's she drank 1 cup and vomited it back up.  Noticed emesis bag beside her was empty when I ask her about this she just said well I can't drink anymore.  Dr. Roxanne Mins made aware of same.

## 2013-07-26 NOTE — ED Notes (Signed)
Pt to xray at this time.

## 2013-07-26 NOTE — ED Notes (Signed)
Encouraged pt to sip on contrast.

## 2013-07-28 LAB — URINE CULTURE

## 2013-08-12 ENCOUNTER — Ambulatory Visit (INDEPENDENT_AMBULATORY_CARE_PROVIDER_SITE_OTHER): Payer: PRIVATE HEALTH INSURANCE | Admitting: Internal Medicine

## 2013-08-12 ENCOUNTER — Encounter: Payer: Self-pay | Admitting: Internal Medicine

## 2013-08-12 VITALS — BP 179/107 | HR 99 | Temp 97.2°F | Wt 179.1 lb

## 2013-08-12 DIAGNOSIS — I129 Hypertensive chronic kidney disease with stage 1 through stage 4 chronic kidney disease, or unspecified chronic kidney disease: Secondary | ICD-10-CM

## 2013-08-12 DIAGNOSIS — Z0000 Encounter for general adult medical examination without abnormal findings: Secondary | ICD-10-CM

## 2013-08-12 DIAGNOSIS — I10 Essential (primary) hypertension: Secondary | ICD-10-CM

## 2013-08-12 DIAGNOSIS — R825 Elevated urine levels of drugs, medicaments and biological substances: Secondary | ICD-10-CM

## 2013-08-12 DIAGNOSIS — R51 Headache: Secondary | ICD-10-CM

## 2013-08-12 DIAGNOSIS — N185 Chronic kidney disease, stage 5: Secondary | ICD-10-CM

## 2013-08-12 DIAGNOSIS — R892 Abnormal level of other drugs, medicaments and biological substances in specimens from other organs, systems and tissues: Secondary | ICD-10-CM

## 2013-08-12 DIAGNOSIS — R109 Unspecified abdominal pain: Secondary | ICD-10-CM | POA: Insufficient documentation

## 2013-08-12 LAB — BASIC METABOLIC PANEL WITH GFR
BUN: 33 mg/dL — ABNORMAL HIGH (ref 6–23)
CO2: 29 mEq/L (ref 19–32)
Calcium: 8.8 mg/dL (ref 8.4–10.5)
Chloride: 103 mEq/L (ref 96–112)
Creat: 4 mg/dL — ABNORMAL HIGH (ref 0.50–1.10)
GFR, Est African American: 12 mL/min — ABNORMAL LOW
GFR, Est Non African American: 11 mL/min — ABNORMAL LOW
Glucose, Bld: 92 mg/dL (ref 70–99)
Potassium: 3.7 mEq/L (ref 3.5–5.3)
Sodium: 140 mEq/L (ref 135–145)

## 2013-08-12 MED ORDER — DILTIAZEM HCL ER COATED BEADS 240 MG PO CP24: 240.0000 mg | ORAL_CAPSULE | Freq: Every day | ORAL | Status: DC

## 2013-08-12 MED ORDER — PROMETHAZINE HCL 12.5 MG PO TABS: 12.5000 mg | ORAL_TABLET | Freq: Four times a day (QID) | ORAL | Status: DC | PRN

## 2013-08-12 NOTE — Assessment & Plan Note (Addendum)
Patient states she already had flu shot this year.  She is also arranging ophthalmology follow-up soon as she missed her appointment in January due to being in ED.   She wants to have mammogram but not until later this month.   She adamantly refuses colonoscopy.

## 2013-08-12 NOTE — Assessment & Plan Note (Addendum)
Patient states she never followed up with [facility] after our visit last summer.  Explained to patient that her abdominal symptoms are likely related to her kidney disease.  Patient did not understand that are kidneys are failing, states she wants to live until age 73, wants dialysis if it would save her life.  Explained that she will very likely need dialysis if she wants to live many more years.  On exam, lungs clear, no LE edema.  Placed urgent referral to [facility], patient states she will attend this appointment.  Per above, offered her admission to hospital today but she refused.  BMP today, follow-up visit on Monday.

## 2013-08-12 NOTE — Assessment & Plan Note (Addendum)
Likely related to end stage renal disease.  Patient seen in ED three times in 07/2013 for abdominal pain/emesis.  See HPI for further details.  Patient has had decreased PO intake at home due to nausea after eating.  However, she has not been taking Zofran due to financial constraints as well as not feeling nauseous prior to eating.  Gave rx for phenergan today as likely cheaper than Zofran, explained that this may be sedating (patient does not drive).  Instructed her to take phenergan at least once daily before eating to prevent nausea with eating.

## 2013-08-12 NOTE — Progress Notes (Signed)
Patient ID: Nancy [name], female   DOB: [birth year], 73 y.o.   MRN: [id number]   Subjective:   Patient ID: Nancy [name] female   DOB: 25-Apr-1941 73 y.o.   MRN: [id number]  HPI: [name] is a 73 y.o. woman with history of HTN, HL, CKD5, cocaine abuse, medication noncompliance who presents for ED follow-up.  She has been seen in ED 3 times this month for emesis.   Patient states that she has been sick for past month.  She has been having "stomach pains" and nausea and has not been able to keep much food down.  She has some abdominal pain today but does not feel nauseated and has not vomited this week.  She still has prescription for Zofran ODT that she got at her last ED visit in her purse, states she didn't think she was supposed to take it unless she was nauseous and never feels nauseous unless she tries to eat.  She had gotten previous prescription for Zofran filled but also could not afford to fill this one.    BP today 192/112, repeat 179/107; patient states she had exerted herself walking to [facility].  Her husband checks her BP daily at home which has reportedly been running in 180s/100s for at least past month.  Patient states she has not taken her blood pressure medicine for the past week, but the bottle she has with her today is the same one from 02/2013 prescribed by Dr. Nicoletta Dress at hospital discharge.  (She was started on diltiazem alone while inpatient in 02/2013 for blood pressure and heart rate control in setting of cocaine abuse.)  She states she called pharmacy for refill, was told her there were none left, but then she did not call [facility]  She has a headache today but adamantly denies chest pain, palpitations, shortness of breath, blurry vision, dizziness.  She also denies drug use and had negative UDS at end of January.   She has also been constipated in setting of decreased PO intake.  She does have a history of diverticulosis and fecal impaction.  Past Medical History  Diagnosis Date    . CVA (cerebrovascular accident)     New hemorrhagic per CT scan '09  . Fecal impaction   . Diverticulosis of colon   . Hypertension   . Pulmonary nodule   . Dysfunctional uterine bleeding   . Postmenopausal   . OA (osteoarthritis)     bilateral knees  . Colitis   . Bacterial sinusitis 09/17/2011  . TINEA CRURIS 01/12/2007  . HERNIORRHAPHY, HX OF 08/11/2006  . CKD (chronic kidney disease) stage 4, GFR 15-29 ml/min 08/11/2006    Cr continues to increase. Proteinuria on UA 02/10/12.     Current Outpatient Prescriptions  Medication Sig Dispense Refill  . famotidine (PEPCID) 40 MG tablet take 1 tablet by mouth once daily  30 tablet  11  . Travoprost, BAK Free, (TRAVATAN) 0.004 % SOLN ophthalmic solution Place 1-2 drops into both eyes 2 (two) times daily. 2 drops in am, 1 drop in pm      . diltiazem (CARTIA XT) 240 MG 24 hr capsule Take 1 capsule (240 mg total) by mouth daily.  30 capsule  1  . ondansetron (ZOFRAN ODT) 4 MG disintegrating tablet Take 1 tablet (4 mg total) by mouth every 8 (eight) hours as needed for nausea.  6 tablet  0  . promethazine (PHENERGAN) 12.5 MG tablet Take 1 tablet (12.5 mg total) by mouth every  6 (six) hours as needed for nausea or vomiting.  30 tablet  0   No current facility-administered medications for this visit.   Family History  Problem Relation Age of Onset  . Hypertension Mother   . Heart attack Mother   . Heart disease Mother    History   Social History  . Marital Status: Married    Spouse Name: N/A    Number of Children: N/A  . Years of Education: N/A   Social History Main Topics  . Smoking status: Never Smoker   . Smokeless tobacco: Never Used  . Alcohol Use: No  . Drug Use: No     Comment: 08/15/08 UDS + cocaine  . Sexual Activity: None   Other Topics Concern  . None   Social History Narrative   Married, lives with her husband. 1 child.          Review of Systems: Review of Systems  Constitutional: Negative for fever and chills.   HENT: Negative for congestion.   Eyes: Negative for blurred vision.       Patient has glaucoma, states vision stable.   Respiratory: Negative for cough and shortness of breath.   Cardiovascular: Negative for chest pain, palpitations and leg swelling.  Gastrointestinal: Positive for abdominal pain and constipation. Negative for nausea, vomiting and diarrhea.  Genitourinary: Negative for dysuria.  Musculoskeletal: Negative for falls.  Neurological: Positive for headaches. Negative for dizziness, loss of consciousness and weakness.    Objective:  Physical Exam: Filed Vitals:   08/12/13 1051 08/12/13 1116  BP: 192/112 179/107  Pulse: 99   Temp: 97.2 F (36.2 C)   TempSrc: Oral   Weight: 179 lb 1.6 oz (81.239 kg)   SpO2: 100%    General: alert, cooperative, and in no apparent distress HEENT: NCAT, vision grossly intact, oropharynx clear and non-erythematous  Neck: supple, no lymphadenopathy Lungs: clear to ascultation bilaterally, normal work of respiration, no wheezes, rales, ronchi Heart: regular rate and rhythm, no murmurs, gallops, or rubs Abdomen: soft, mild TTP in LLQ, non-distended, normal bowel sounds Extremities: 2+ DP/PT pulses bilaterally, no cyanosis, clubbing, or edema Neurologic: alert & oriented X3, cranial nerves II-XII intact, strength grossly intact, sensation intact to light touch  Assessment & Plan:  Patient discussed with Dr. Ellwood Dense. Please see problem-based assessment and plan.

## 2013-08-12 NOTE — Patient Instructions (Signed)
Follow-up with Dr. Stann Mainland on Monday morning.  If you are not doing better then in terms of blood pressure, belly pain, and eating, we will admit you to the hospital.    Please take all of your medicines as prescribed.  This is VERY IMPORTANT.   Your prescriptions for blood pressure medicine and nausea medicine are waiting on you at the pharmacy.  You should take the nausea medicine before you eat so you can keep food down.  We do not want you to become dehydrated from not eating and drinking.   We referred you to the kidney doctors again today.  They will call you with the appointment time.  It is VERY IMPORTANT that you attend this appointment.    We are checking your electrolytes and kidney function on a blood test today.   We will refer you for mammogram at your next visit.    Dehydration, Adult Dehydration is when you lose more fluids from the body than you take in. Vital organs like the kidneys, brain, and heart cannot function without a proper amount of fluids and salt. Any loss of fluids from the body can cause dehydration.  CAUSES   Vomiting.  Diarrhea.  Excessive sweating.  Excessive urine output.  Fever. SYMPTOMS  Mild dehydration  Thirst.  Dry lips.  Slightly dry mouth. Moderate dehydration  Very dry mouth.  Sunken eyes.  Skin does not bounce back quickly when lightly pinched and released.  Dark urine and decreased urine production.  Decreased tear production.  Headache. Severe dehydration  Very dry mouth.  Extreme thirst.  Rapid, weak pulse (more than 100 beats per minute at rest).  Cold hands and feet.  Not able to sweat in spite of heat and temperature.  Rapid breathing.  Blue lips.  Confusion and lethargy.  Difficulty being awakened.  Minimal urine production.  No tears. DIAGNOSIS  Your caregiver will diagnose dehydration based on your symptoms and your exam. Blood and urine tests will help confirm the diagnosis. The diagnostic  evaluation should also identify the cause of dehydration. TREATMENT  Treatment of mild or moderate dehydration can often be done at home by increasing the amount of fluids that you drink. It is best to drink small amounts of fluid more often. Drinking too much at one time can make vomiting worse. Refer to the home care instructions below. Severe dehydration needs to be treated at the hospital where you will probably be given intravenous (IV) fluids that contain water and electrolytes. HOME CARE INSTRUCTIONS   Ask your caregiver about specific rehydration instructions.  Drink enough fluids to keep your urine clear or pale yellow.  Drink small amounts frequently if you have nausea and vomiting.  Eat as you normally do.  Avoid:  Foods or drinks high in sugar.  Carbonated drinks.  Juice.  Extremely hot or cold fluids.  Drinks with caffeine.  Fatty, greasy foods.  Alcohol.  Tobacco.  Overeating.  Gelatin desserts.  Wash your hands well to avoid spreading bacteria and viruses.  Only take over-the-counter or prescription medicines for pain, discomfort, or fever as directed by your caregiver.  Ask your caregiver if you should continue all prescribed and over-the-counter medicines.  Keep all follow-up appointments with your caregiver. SEEK MEDICAL CARE IF:  You have abdominal pain and it increases or stays in one area (localizes).  You have a rash, stiff neck, or severe headache.  You are irritable, sleepy, or difficult to awaken.  You are weak, dizzy, or extremely  thirsty. SEEK IMMEDIATE MEDICAL CARE IF:   You are unable to keep fluids down or you get worse despite treatment.  You have frequent episodes of vomiting or diarrhea.  You have blood or green matter (bile) in your vomit.  You have blood in your stool or your stool looks black and tarry.  You have not urinated in 6 to 8 hours, or you have only urinated a small amount of very dark urine.  You have a  fever.  You faint. MAKE SURE YOU:   Understand these instructions.  Will watch your condition.  Will get help right away if you are not doing well or get worse. Document Released: 06/24/2005 Document Revised: 09/16/2011 Document Reviewed: 02/11/2011 [facility] Patient Information 2014 [place], [place].

## 2013-08-12 NOTE — Assessment & Plan Note (Signed)
Instructed patient to purchase Tylenol over the counter and take 2 tabs as needed for headache.  Emphasized importance of NOT taking Advil, Aleve, etc, patient voiced understanding.

## 2013-08-12 NOTE — Assessment & Plan Note (Signed)
UDS positive for cocaine on admission in 02/2013.  Patient adamantly denies drug use since that time.  Had negative UDS in ED on 07/26/13 thus will not repeat today.

## 2013-08-12 NOTE — Assessment & Plan Note (Signed)
BP 192/112, repeat 179/107 today.  Patient has apparently been on no antihypertensives since 03/2013 though BP previously controlled on diltiazem monotherapy.  She does have headache today.  Patient was offered admission to hospital but refused.  States that she will return for follow-up on Monday morning and understands that if she is not doing better at that time, she will be admitted then.  Refilled prescription for diltiazem today which patient will go straight to pharmacy to pick up.  Patient understands that she should only take Tylenol for her headache.  She also understands importance of nephrology follow-up.

## 2013-08-13 ENCOUNTER — Telehealth: Payer: Self-pay | Admitting: Internal Medicine

## 2013-08-13 NOTE — Telephone Encounter (Signed)
Patient's granddaughter, [name], called to clarify the details of patient's visit yesterday.  Permission with patient to speak with me.  Explained situation with failing kidneys, hypertension with medication compliance.  [name] voiced understanding, all questions answered.  She plans to call [facility] after our phone call to clarify when patient's appointment is there.   Patient only has one child who does not live in [place], one grandchild [place]) who does live here and is going to help with transportation to appointments and medication management going forward.

## 2013-08-15 ENCOUNTER — Emergency Department (HOSPITAL_COMMUNITY): Payer: PRIVATE HEALTH INSURANCE

## 2013-08-15 ENCOUNTER — Telehealth: Payer: Self-pay | Admitting: Internal Medicine

## 2013-08-15 ENCOUNTER — Encounter (HOSPITAL_COMMUNITY): Payer: Self-pay | Admitting: Emergency Medicine

## 2013-08-15 ENCOUNTER — Emergency Department (HOSPITAL_COMMUNITY)
Admission: EM | Admit: 2013-08-15 | Discharge: 2013-08-15 | Disposition: A | Discharge status: Home / Self Care | Payer: PRIVATE HEALTH INSURANCE | Attending: Emergency Medicine | Admitting: Emergency Medicine

## 2013-08-15 DIAGNOSIS — Z8739 Personal history of other diseases of the musculoskeletal system and connective tissue: Secondary | ICD-10-CM | POA: Insufficient documentation

## 2013-08-15 DIAGNOSIS — Z90710 Acquired absence of both cervix and uterus: Secondary | ICD-10-CM | POA: Insufficient documentation

## 2013-08-15 DIAGNOSIS — R109 Unspecified abdominal pain: Secondary | ICD-10-CM

## 2013-08-15 DIAGNOSIS — R1084 Generalized abdominal pain: Secondary | ICD-10-CM | POA: Insufficient documentation

## 2013-08-15 DIAGNOSIS — Z8673 Personal history of transient ischemic attack (TIA), and cerebral infarction without residual deficits: Secondary | ICD-10-CM | POA: Insufficient documentation

## 2013-08-15 DIAGNOSIS — R112 Nausea with vomiting, unspecified: Secondary | ICD-10-CM | POA: Insufficient documentation

## 2013-08-15 DIAGNOSIS — Z9889 Other specified postprocedural states: Secondary | ICD-10-CM | POA: Insufficient documentation

## 2013-08-15 DIAGNOSIS — R197 Diarrhea, unspecified: Secondary | ICD-10-CM | POA: Insufficient documentation

## 2013-08-15 DIAGNOSIS — N184 Chronic kidney disease, stage 4 (severe): Secondary | ICD-10-CM | POA: Insufficient documentation

## 2013-08-15 DIAGNOSIS — Z9089 Acquired absence of other organs: Secondary | ICD-10-CM | POA: Insufficient documentation

## 2013-08-15 DIAGNOSIS — Z8619 Personal history of other infectious and parasitic diseases: Secondary | ICD-10-CM | POA: Insufficient documentation

## 2013-08-15 DIAGNOSIS — Z8709 Personal history of other diseases of the respiratory system: Secondary | ICD-10-CM | POA: Insufficient documentation

## 2013-08-15 DIAGNOSIS — Z8742 Personal history of other diseases of the female genital tract: Secondary | ICD-10-CM | POA: Insufficient documentation

## 2013-08-15 DIAGNOSIS — Z8719 Personal history of other diseases of the digestive system: Secondary | ICD-10-CM | POA: Insufficient documentation

## 2013-08-15 DIAGNOSIS — Z79899 Other long term (current) drug therapy: Secondary | ICD-10-CM | POA: Insufficient documentation

## 2013-08-15 DIAGNOSIS — I129 Hypertensive chronic kidney disease with stage 1 through stage 4 chronic kidney disease, or unspecified chronic kidney disease: Secondary | ICD-10-CM | POA: Insufficient documentation

## 2013-08-15 LAB — URINALYSIS, ROUTINE W REFLEX MICROSCOPIC
BILIRUBIN URINE: NEGATIVE
Glucose, UA: NEGATIVE mg/dL
Ketones, ur: NEGATIVE mg/dL
Leukocytes, UA: NEGATIVE
NITRITE: NEGATIVE
Protein, ur: >300 mg/dL — AB
SPECIFIC GRAVITY, URINE: 1.015 (ref 1.005–1.030)
UROBILINOGEN UA: 1 mg/dL (ref 0.0–1.0)
pH: 6 (ref 5.0–8.0)

## 2013-08-15 LAB — COMPREHENSIVE METABOLIC PANEL
ALT: 6 U/L (ref 0–35)
AST: 10 U/L (ref 0–37)
Albumin: 2.9 g/dL — ABNORMAL LOW (ref 3.5–5.2)
Alkaline Phosphatase: 40 U/L (ref 39–117)
BUN: 36 mg/dL — AB (ref 6–23)
CALCIUM: 8.5 mg/dL (ref 8.4–10.5)
CO2: 25 meq/L (ref 19–32)
CREATININE: 4.03 mg/dL — AB (ref 0.50–1.10)
Chloride: 109 mEq/L (ref 96–112)
GFR, EST AFRICAN AMERICAN: 12 mL/min — AB (ref >90)
GFR, EST NON AFRICAN AMERICAN: 10 mL/min — AB (ref >90)
Glucose, Bld: 106 mg/dL — ABNORMAL HIGH (ref 70–99)
Potassium: 4 mEq/L (ref 3.7–5.3)
SODIUM: 147 meq/L (ref 137–147)
TOTAL PROTEIN: 6 g/dL (ref 6.0–8.3)
Total Bilirubin: 0.3 mg/dL (ref 0.3–1.2)

## 2013-08-15 LAB — LIPASE, BLOOD: LIPASE: 39 U/L (ref 11–59)

## 2013-08-15 LAB — RAPID URINE DRUG SCREEN, HOSP PERFORMED
AMPHETAMINES: NOT DETECTED
Barbiturates: NOT DETECTED
Benzodiazepines: NOT DETECTED
Cocaine: NOT DETECTED
Opiates: POSITIVE — AB
TETRAHYDROCANNABINOL: NOT DETECTED

## 2013-08-15 LAB — CBC WITH DIFFERENTIAL/PLATELET
Basophils Absolute: 0 10*3/uL (ref 0.0–0.1)
Basophils Relative: 0 % (ref 0–1)
EOS ABS: 0 10*3/uL (ref 0.0–0.7)
EOS PCT: 1 % (ref 0–5)
HEMATOCRIT: 29.6 % — AB (ref 36.0–46.0)
HEMOGLOBIN: 9.8 g/dL — AB (ref 12.0–15.0)
LYMPHS PCT: 24 % (ref 12–46)
Lymphs Abs: 1.1 10*3/uL (ref 0.7–4.0)
MCH: 30.1 pg (ref 26.0–34.0)
MCHC: 33.1 g/dL (ref 30.0–36.0)
MCV: 90.8 fL (ref 78.0–100.0)
MONO ABS: 0.2 10*3/uL (ref 0.1–1.0)
MONOS PCT: 5 % (ref 3–12)
Neutro Abs: 3.2 10*3/uL (ref 1.7–7.7)
Neutrophils Relative %: 71 % (ref 43–77)
PLATELETS: 197 10*3/uL (ref 150–400)
RBC: 3.26 MIL/uL — AB (ref 3.87–5.11)
RDW: 14 % (ref 11.5–15.5)
WBC: 4.5 10*3/uL (ref 4.0–10.5)

## 2013-08-15 LAB — URINE MICROSCOPIC-ADD ON

## 2013-08-15 LAB — OCCULT BLOOD, POC DEVICE: Fecal Occult Bld: NEGATIVE

## 2013-08-15 MED ORDER — ONDANSETRON HCL 4 MG/2ML IJ SOLN
4.0000 mg | Freq: Once | INTRAMUSCULAR | Status: AC
  Administered 2013-08-15: 4 mg via INTRAVENOUS
  Filled 2013-08-15: qty 2

## 2013-08-15 MED ORDER — PROMETHAZINE HCL 12.5 MG PO TABS: 12.5000 mg | ORAL_TABLET | Freq: Four times a day (QID) | ORAL | Status: DC | PRN

## 2013-08-15 MED ORDER — MORPHINE SULFATE 4 MG/ML IJ SOLN
4.0000 mg | Freq: Once | INTRAMUSCULAR | Status: AC
  Administered 2013-08-15: 4 mg via INTRAVENOUS
  Filled 2013-08-15: qty 1

## 2013-08-15 MED ORDER — SODIUM CHLORIDE 0.9 % IV BOLUS (SEPSIS)
500.0000 mL | Freq: Once | INTRAVENOUS | Status: AC
  Administered 2013-08-15: 500 mL via INTRAVENOUS

## 2013-08-15 NOTE — Telephone Encounter (Signed)
  INTERNAL MEDICINE RESIDENCY PROGRAM After-Hours Telephone Call    Reason for call:   I received a call from Ms. [place] E Hogsett at 10:25 AM, 08/15/2013 . I spoke with [name] [phone] informing me  that her grandmother was vomiting and was unable to hold down any fluids for the past several days. Patient was recently evaluated in outpatient clinic on 08/12/2013 for nausea and vomiting and she was recommended to see a nephrologist as outpatient due to worsening renal function. Patient's granddaughter is concerned about Ms. Griffy renal function, and she is asking about and need for hemodialysis. She informed me that Ms. Toolan is on her way to the emergency department. .    Pertinent Data:  Her labs from her office visit on 08/12/2013 revealed increased BUN of 33 and a low creatinine clearance of 12.     Assessment / Plan / Recommendations:   I informed the patient's granddaughter that if Ms Amon ends up in the emergency department at [facility], she will possibly obtain labs and if needed be admitted to our service for further care.      Jessee Avers, MD   08/15/2013, 10:25 AM

## 2013-08-15 NOTE — ED Provider Notes (Signed)
CSN: [id number]     Arrival date & time 08/15/13  0815 History   First MD Initiated Contact with Patient 08/15/13 [phone]     Chief Complaint  Patient presents with  . Abdominal Pain   (Consider location/radiation/quality/duration/timing/severity/associated sxs/prior Treatment) HPI Comments: Patient presents with abdominal pain. She's had some ongoing abdominal pain since January 1. She describes it as an achy pain that's intermittent across her lower abdomen. She does have some intermittent nausea and vomiting with it. She previously had diarrhea but did not report any ongoing diarrhea. She denies any blood in her stool or melena. Her emesis is nonbloody and nonbilious. She denies any fevers or chills. She does have chronic kidney disease and has an upcoming appointment to see a nephrologist for the first time. She is followed at the internal medicine clinic. She saw her primary care doctor last week who was going to admit the patient to the hospital for her abdominal pain and high blood pressure.  However at that point she wanted to go home. Patient comes back today for admission.  Patient is a 73 y.o. female presenting with abdominal pain.  Abdominal Pain Associated symptoms: diarrhea, nausea and vomiting   Associated symptoms: no chest pain, no chills, no cough, no fatigue, no fever, no hematuria and no shortness of breath     Past Medical History  Diagnosis Date  . CVA (cerebrovascular accident)     New hemorrhagic per CT scan '09  . Fecal impaction   . Diverticulosis of colon   . Hypertension   . Pulmonary nodule   . Dysfunctional uterine bleeding   . Postmenopausal   . OA (osteoarthritis)     bilateral knees  . Colitis   . Bacterial sinusitis 09/17/2011  . TINEA CRURIS 01/12/2007  . HERNIORRHAPHY, HX OF 08/11/2006  . CKD (chronic kidney disease) stage 4, GFR 15-29 ml/min 08/11/2006    Cr continues to increase. Proteinuria on UA 02/10/12.     Past Surgical History  Procedure Laterality  Date  . Inguinal hernia repair  2008  . Abdominal hysterectomy    . Cholecystectomy  2009  . Iridotomy / iridectomy      Laser, right eye 12/26/11 left eye 01/24/12  . Mass excision Left 05/07/2013    Procedure: EXCISION CYST;  Surgeon: Myrtha Mantis., MD;  Location: [facility];  Service: Ophthalmology;  Laterality: Left;   Family History  Problem Relation Age of Onset  . Hypertension Mother   . Heart attack Mother   . Heart disease Mother    History  Substance Use Topics  . Smoking status: Never Smoker   . Smokeless tobacco: Never Used  . Alcohol Use: No   OB History   Grav Para Term Preterm Abortions TAB SAB Ect Mult Living                 Review of Systems  Constitutional: Negative for fever, chills, diaphoresis and fatigue.  HENT: Negative for congestion, rhinorrhea and sneezing.   Eyes: Negative.   Respiratory: Negative for cough, chest tightness and shortness of breath.   Cardiovascular: Negative for chest pain and leg swelling.  Gastrointestinal: Positive for nausea, vomiting, abdominal pain and diarrhea. Negative for blood in stool.  Genitourinary: Negative for frequency, hematuria, flank pain and difficulty urinating.  Musculoskeletal: Negative for arthralgias and back pain.  Skin: Negative for rash.  Neurological: Negative for dizziness, speech difficulty, weakness, numbness and headaches.    Allergies  Hydrocodone-acetaminophen  Home  Medications   Current Outpatient Rx  Name  Route  Sig  Dispense  Refill  . diltiazem (CARTIA XT) 240 MG 24 hr capsule   Oral   Take 1 capsule (240 mg total) by mouth daily.   30 capsule   1   . famotidine (PEPCID) 40 MG tablet      take 1 tablet by mouth once daily   30 tablet   11   . promethazine (PHENERGAN) 12.5 MG tablet   Oral   Take 1 tablet (12.5 mg total) by mouth every 6 (six) hours as needed for nausea or vomiting.   30 tablet   0   . Travoprost, BAK Free, (TRAVATAN) 0.004 % SOLN  ophthalmic solution   Both Eyes   Place 1-2 drops into both eyes 2 (two) times daily. 1 drops in am, 2 drop in pm          BP 170/67  Pulse 90  Temp(Src) 97.9 F (36.6 C) (Oral)  Resp 18  SpO2 98% Physical Exam  Constitutional: She is oriented to person, place, and time. She appears well-developed and well-nourished.  HENT:  Head: Normocephalic and atraumatic.  Mouth/Throat: Oropharynx is clear and moist.  Eyes: Pupils are equal, round, and reactive to light.  Neck: Normal range of motion. Neck supple.  Cardiovascular: Normal rate, regular rhythm and normal heart sounds.   Pulmonary/Chest: Effort normal and breath sounds normal. No respiratory distress. She has no wheezes. She has no rales. She exhibits no tenderness.  Abdominal: Soft. Bowel sounds are normal. There is tenderness (mild diffuse tenderness). There is no rebound and no guarding.  Musculoskeletal: Normal range of motion. She exhibits no edema.  Lymphadenopathy:    She has no cervical adenopathy.  Neurological: She is alert and oriented to person, place, and time.  Skin: Skin is warm and dry. No rash noted.  Psychiatric: She has a normal mood and affect.    ED Course  Procedures (including critical care time) Labs Review Results for orders placed during the hospital encounter of 08/15/13  CBC WITH DIFFERENTIAL      Result Value Range   WBC 4.5  4.0 - 10.5 K/uL   RBC 3.26 (*) 3.87 - 5.11 MIL/uL   Hemoglobin 9.8 (*) 12.0 - 15.0 g/dL   HCT 29.6 (*) 36.0 - 46.0 %   MCV 90.8  78.0 - 100.0 fL   MCH 30.1  26.0 - 34.0 pg   MCHC 33.1  30.0 - 36.0 g/dL   RDW 14.0  11.5 - 15.5 %   Platelets 197  150 - 400 K/uL   Neutrophils Relative % 71  43 - 77 %   Neutro Abs 3.2  1.7 - 7.7 K/uL   Lymphocytes Relative 24  12 - 46 %   Lymphs Abs 1.1  0.7 - 4.0 K/uL   Monocytes Relative 5  3 - 12 %   Monocytes Absolute 0.2  0.1 - 1.0 K/uL   Eosinophils Relative 1  0 - 5 %   Eosinophils Absolute 0.0  0.0 - 0.7 K/uL   Basophils  Relative 0  0 - 1 %   Basophils Absolute 0.0  0.0 - 0.1 K/uL  COMPREHENSIVE METABOLIC PANEL      Result Value Range   Sodium 147  137 - 147 mEq/L   Potassium 4.0  3.7 - 5.3 mEq/L   Chloride 109  96 - 112 mEq/L   CO2 25  19 - 32 mEq/L   Glucose, Bld  106 (*) 70 - 99 mg/dL   BUN 36 (*) 6 - 23 mg/dL   Creatinine, Ser 4.03 (*) 0.50 - 1.10 mg/dL   Calcium 8.5  8.4 - 10.5 mg/dL   Total Protein 6.0  6.0 - 8.3 g/dL   Albumin 2.9 (*) 3.5 - 5.2 g/dL   AST 10  0 - 37 U/L   ALT 6  0 - 35 U/L   Alkaline Phosphatase 40  39 - 117 U/L   Total Bilirubin 0.3  0.3 - 1.2 mg/dL   GFR calc non Af Amer 10 (*) >90 mL/min   GFR calc Af Amer 12 (*) >90 mL/min  LIPASE, BLOOD      Result Value Range   Lipase 39  11 - 59 U/L  URINALYSIS, ROUTINE W REFLEX MICROSCOPIC      Result Value Range   Color, Urine YELLOW  YELLOW   APPearance CLEAR  CLEAR   Specific Gravity, Urine 1.015  1.005 - 1.030   pH 6.0  5.0 - 8.0   Glucose, UA NEGATIVE  NEGATIVE mg/dL   Hgb urine dipstick TRACE (*) NEGATIVE   Bilirubin Urine NEGATIVE  NEGATIVE   Ketones, ur NEGATIVE  NEGATIVE mg/dL   Protein, ur >300 (*) NEGATIVE mg/dL   Urobilinogen, UA 1.0  0.0 - 1.0 mg/dL   Nitrite NEGATIVE  NEGATIVE   Leukocytes, UA NEGATIVE  NEGATIVE  URINE MICROSCOPIC-ADD ON      Result Value Range   Squamous Epithelial / LPF FEW (*) RARE  URINE RAPID DRUG SCREEN (HOSP PERFORMED)      Result Value Range   Opiates POSITIVE (*) NONE DETECTED   Cocaine NONE DETECTED  NONE DETECTED   Benzodiazepines NONE DETECTED  NONE DETECTED   Amphetamines NONE DETECTED  NONE DETECTED   Tetrahydrocannabinol NONE DETECTED  NONE DETECTED   Barbiturates NONE DETECTED  NONE DETECTED  OCCULT BLOOD, POC DEVICE      Result Value Range   Fecal Occult Bld NEGATIVE  NEGATIVE   Ct Abdomen Pelvis Wo Contrast  07/26/2013   CLINICAL DATA:  Diffuse abdominal pain, nausea and vomiting  EXAM: CT ABDOMEN AND PELVIS WITHOUT CONTRAST  TECHNIQUE: Multidetector CT imaging of the  abdomen and pelvis was performed following the standard protocol without intravenous contrast.  COMPARISON:  CT abdomen pelvis - 07/13/2012  FINDINGS: The lack of intravenous contrast limits the ability to evaluate solid abdominal organs.  Normal hepatic contour.  Post cholecystectomy.  No ascites.  Normal noncontrast appearance of the bilateral kidneys. No definite renal stones. No stones are seen along the expected course of either ureter or the urinary bladder. Normal noncontrast appearance of the urinary bladder given under distention. The urinary obstruction or perinephric stranding. Normal noncontrast appearance of the bilateral adrenal glands and spleen. The pancreas is largely fatty replaced.  Ingestion enteric contrast extends to the level of the hepatic flexure of the colon. Moderate colonic stool burden without evidence of obstruction. Colonic diverticulosis without evidence of diverticulitis. Small hiatal hernia. Normal noncontrast appearance of the appendix. No pneumoperitoneum, pneumatosis or portal venous gas.  Scattered minimal atherosclerotic plaque within a normal caliber abdominal aorta. No bulky retroperitoneal, mesenteric, pelvic or inguinal lymphadenopathy on this noncontrast examination.  A degenerating partially calcified fibroid is noted within the fundus of the uterus. No discrete adnexal lesion on this noncontrast examination. No free fluid within the pelvis.  Limited visualization of the lower thorax demonstrates grossly unchanged punctate (5 and 4 mm) nodules within the imaged right lower  lobe (both nodule seen on image 5, series 3) grossly unchanged since the 08/2008 examination and thus of benign etiology. No pleural effusion. No focal airspace opacities.  Normal heart size. Trace amount of pericardial fluid, likely physiologic. Calcifications within the aortic valve leaflets and mitral valve annulus. Coronary artery calcifications.  No acute or aggressive osseus abnormalities.  Mild-to-moderate multilevel lumbar spine DDD, likely worse at L5-S1 with disc space height loss, endplate irregularity and sclerosis. Regional soft tissues appear normal.  IMPRESSION: 1. No explanation for patient's diffuse abdominal pain, nausea and vomiting. Specifically, no evidence of nephrolithiasis, urinary or enteric obstruction. Normal noncontrast appearance of the appendix. 2. Colonic diverticulosis without definite evidence of diverticulitis on this noncontrast examination. 3. Small hiatal hernia. 4. Degenerating partially calcified fibroid within the fundus of the uterus. 5. Punctate nodules within the imaged right lower lobe, unchanged since the 2010 examination and breast of benign etiology.   Electronically Signed   By: Sandi Mariscal M.D.   On: 07/26/2013 20:53   Dg Chest 2 View  07/20/2013   CLINICAL DATA:  Shortness of breath.  EXAM: CHEST  2 VIEW  COMPARISON:  Portable examination 07/13/2013. Two-view chest x-ray 02/04/2013, 08/15/2008.  FINDINGS: Suboptimal inspiration due to body habitus accounts for crowded bronchovascular markings, especially in the bases, and accentuates the cardiac silhouette. Taking this into account, cardiac silhouette normal in size, unchanged. Thoracic aorta mildly atherosclerotic, unchanged. Hilar and mediastinal contours otherwise unremarkable. Lungs clear. Bronchovascular markings normal. Pulmonary vascularity normal. No visible pleural effusions. No pneumothorax. Visualized bony thorax intact.  IMPRESSION: Suboptimal inspiration.  No acute cardiopulmonary disease.   Electronically Signed   By: Evangeline Dakin M.D.   On: 07/20/2013 22:59   Dg Abd 1 View  07/26/2013   CLINICAL DATA:  Umbilical pain, emesis  EXAM: ABDOMEN - 1 VIEW  COMPARISON:  Prior radiograph from 08/17/2012  FINDINGS: Gaseous worsened scattered within serve nondilated loops of bowel within the abdomen. No evidence of obstruction or ileus. No free intraperitoneal air. Calcified fibroid overlies the  mid pelvis. No soft tissue mass.  Degenerative changes noted within the visualized spine. Cholecystectomy clips overlie the right upper quadrant.  IMPRESSION: No radiographic evidence of acute intra-abdominal process. Nonobstructive bowel gas pattern.   Electronically Signed   By: Jeannine Boga M.D.   On: 07/26/2013 17:14   Dg Abd Acute W/chest  08/15/2013   CLINICAL DATA:  Abdominal pain, nausea and vomiting.  EXAM: ACUTE ABDOMEN SERIES (ABDOMEN 2 VIEW & CHEST 1 VIEW)  COMPARISON:  Previous examinations.  FINDINGS: Again demonstrated is a poor inspiration with a grossly normal sized heart. Clear lungs. Unremarkable bones.  Normal bowel gas pattern without free peritoneal air. Cholecystectomy clips. Mildly prominent stool. Coarse calcification in the right pelvis, compatible with a calcified uterine fibroid. Lumbar spine degenerative changes.  IMPRESSION: No acute abnormality.  Mildly prominent stool.   Electronically Signed   By: Enrique Sack M.D.   On: 08/15/2013 09:38     Imaging Review Dg Abd Acute W/chest  08/15/2013   CLINICAL DATA:  Abdominal pain, nausea and vomiting.  EXAM: ACUTE ABDOMEN SERIES (ABDOMEN 2 VIEW & CHEST 1 VIEW)  COMPARISON:  Previous examinations.  FINDINGS: Again demonstrated is a poor inspiration with a grossly normal sized heart. Clear lungs. Unremarkable bones.  Normal bowel gas pattern without free peritoneal air. Cholecystectomy clips. Mildly prominent stool. Coarse calcification in the right pelvis, compatible with a calcified uterine fibroid. Lumbar spine degenerative changes.  IMPRESSION: No acute abnormality.  Mildly prominent stool.   Electronically Signed   By: Enrique Sack M.D.   On: 08/15/2013 09:38    EKG Interpretation   None       MDM   1. Abdominal pain    Pt with ongoing abd pain for over a month.  Has had recent negative CT.  No evidence of obstruction today.  Labs similar to baseline.  IM teaching service has seen the patient and feels that she  can be discharged.  Has appt with her PMD tomorrow.  Contacted care management to see if they could help pt fill rx for zofran, but unable.  Pt given rx for phenergan.    Malvin Johns, MD 08/16/13 [phone]

## 2013-08-15 NOTE — Progress Notes (Signed)
Met patient at Bedside.Role Of CM explained.Patient reports today's ED visit secondary to increased abdominal pain.Patient rates current pain level is 5 is numeric scale of 1-10. Patient reports she maintains regular contact with her PCP.Patient has [employer] and is able to afford her prescription co-pays.Patient has a BP monitor at home and she Checks her BP on a daily basis.No further CM needs identified.

## 2013-08-15 NOTE — ED Notes (Signed)
Pt reports ongoing lower abdominal pain and has been seen in the ED 3 times for same. Pt has also been seen by internal med clinic and has papers that state possible admission if pain, appetite and hypertension is not resolved. Pt has scheduled appointment on Monday. Pain has continued and pt reports vomiting x2 today.

## 2013-08-16 ENCOUNTER — Ambulatory Visit (INDEPENDENT_AMBULATORY_CARE_PROVIDER_SITE_OTHER): Payer: PRIVATE HEALTH INSURANCE | Admitting: Internal Medicine

## 2013-08-16 ENCOUNTER — Encounter: Payer: Self-pay | Admitting: Internal Medicine

## 2013-08-16 VITALS — BP 140/90 | HR 80 | Temp 97.0°F | Ht 63.0 in | Wt 183.7 lb

## 2013-08-16 DIAGNOSIS — I10 Essential (primary) hypertension: Secondary | ICD-10-CM

## 2013-08-16 DIAGNOSIS — K219 Gastro-esophageal reflux disease without esophagitis: Secondary | ICD-10-CM

## 2013-08-16 DIAGNOSIS — R109 Unspecified abdominal pain: Secondary | ICD-10-CM

## 2013-08-16 DIAGNOSIS — I120 Hypertensive chronic kidney disease with stage 5 chronic kidney disease or end stage renal disease: Secondary | ICD-10-CM

## 2013-08-16 DIAGNOSIS — N185 Chronic kidney disease, stage 5: Secondary | ICD-10-CM

## 2013-08-16 NOTE — Progress Notes (Signed)
Patient ID: Nancy [name], female   DOB: 11/02/1940, 73 y.o.   MRN: [id number]   Subjective:   Patient ID: Nancy [name] female   DOB: 29-Dec-1940 73 y.o.   MRN: [id number]  HPI: Nancy [name] is a 73 y.o. woman with history of HTN, HL, CKD5, medication noncompliance who presents for follow-up.    Granddaughter [name] called resident on call yesterday 2/8 stating that Nancy [name] was vomiting and on her way to ED.  In ED, Nancy [name] had negative acute abdominal series and labs at baseline thus was discharged home with follow-up in clinic today.   Patient states she has not vomited since yesterday though she still feels a little "woozy."  She was able to eat a steak for breakfast today.  She still has not gotten phenergan filled because her pharmacy did not have it in stock when she went.  She has not heard from [facility] regarding an appointment time.    BP 175/98, repeat 140/90 in clinic today.  She reports compliance with diltiazem and took it already this morning.   She had a bowel movement yesterday before going to ED- formed stool, no blood.   Denies chest pain, palpitations, shortness of breath, blurry vision, dizziness.   Of note, patient states we no longer have permission to speak with her granddaughter [name] (will put this in [place]).    Past Medical History  Diagnosis Date  . CVA (cerebrovascular accident)     New hemorrhagic per CT scan '09  . Fecal impaction   . Diverticulosis of colon   . Hypertension   . Pulmonary nodule   . Dysfunctional uterine bleeding   . Postmenopausal   . OA (osteoarthritis)     bilateral knees  . Colitis   . Bacterial sinusitis 09/17/2011  . TINEA CRURIS 01/12/2007  . HERNIORRHAPHY, HX OF 08/11/2006  . CKD (chronic kidney disease) stage 4, GFR 15-29 ml/min 08/11/2006    Cr continues to increase. Proteinuria on UA 02/10/12.     Current Outpatient Prescriptions  Medication Sig Dispense Refill  . diltiazem (CARTIA XT) 240 MG 24  hr capsule Take 1 capsule (240 mg total) by mouth daily.  30 capsule  1  . famotidine (PEPCID) 40 MG tablet take 1 tablet by mouth once daily  30 tablet  11  . Travoprost, BAK Free, (TRAVATAN) 0.004 % SOLN ophthalmic solution Place 1-2 drops into both eyes 2 (two) times daily. 1 drops in am, 2 drop in pm      . promethazine (PHENERGAN) 12.5 MG tablet Take 1 tablet (12.5 mg total) by mouth every 6 (six) hours as needed for nausea or vomiting.  30 tablet  0   No current facility-administered medications for this visit.   Family History  Problem Relation Age of Onset  . Hypertension Mother   . Heart attack Mother   . Heart disease Mother    History   Social History  . Marital Status: Married    Spouse Name: N/A    Number of Children: N/A  . Years of Education: N/A   Social History Main Topics  . Smoking status: Never Smoker   . Smokeless tobacco: Never Used  . Alcohol Use: No  . Drug Use: No     Comment: 08/15/08 UDS + cocaine  . Sexual Activity: None   Other Topics Concern  . None   Social History Narrative   Married, lives with her husband. 1 child.  Review of Systems: Review of Systems  Constitutional: Negative for fever.  Eyes: Negative for blurred vision.  Respiratory: Negative for cough and shortness of breath.   Cardiovascular: Negative for chest pain, palpitations and leg swelling.  Gastrointestinal: Positive for nausea. Negative for vomiting, abdominal pain, diarrhea, constipation and blood in stool.  Genitourinary: Negative for dysuria.  Musculoskeletal: Negative for falls.  Neurological: Negative for dizziness, loss of consciousness, weakness and headaches.    Objective:  Physical Exam: Filed Vitals:   08/16/13 1010 08/16/13 1040  BP: 175/98 140/90  Pulse: 98 80  Temp: 97 F (36.1 C)   TempSrc: Oral   Height: 5\' 3"  (1.6 m)   Weight: 183 lb 11.2 oz (83.326 kg)   SpO2: 100%    General: alert, cooperative, and in no apparent distress HEENT:  NCAT, vision grossly intact, oropharynx clear and non-erythematous  Neck: supple, no lymphadenopathy Lungs: clear to ascultation bilaterally, normal work of respiration, no wheezes, rales, ronchi Heart: regular rate and rhythm, no murmurs, gallops, or rubs Abdomen: soft, non-tender, non-distended, normal bowel sounds Extremities: 2+ DP/PT pulses bilaterally, no cyanosis, clubbing, or edema Neurologic: alert & oriented X3, cranial nerves II-XII intact, strength grossly intact, sensation intact to light touch  Assessment & Plan:  Patient discussed with Dr. Ellwood Dense.  Please see problem-based assessment and plan.

## 2013-08-16 NOTE — Assessment & Plan Note (Signed)
BP Readings from Last 3 Encounters:  08/16/13 140/90  08/15/13 170/67  08/12/13 179/107    Lab Results  Component Value Date   NA 147 08/15/2013   K 4.0 08/15/2013   CREATININE 4.03* 08/15/2013    Assessment: Blood pressure control:  controlled Progress toward BP goal:   improved Comments: At goal for age on repeat measurement.   Plan: Medications:  continue current medications (diltiazem alone)

## 2013-08-16 NOTE — Assessment & Plan Note (Signed)
Abdominal pain and nausea improved.  Patient was able to tolerate a steak for breakfast this morning.  Still has not gotten phenergan filled.

## 2013-08-16 NOTE — Assessment & Plan Note (Addendum)
Stable, on Pepcid daily.

## 2013-08-16 NOTE — Progress Notes (Signed)
Case discussed with Dr. Lilia Pro at the time of the visit.  We reviewed the resident's history and exam and pertinent patient test results.  I agree with the assessment, diagnosis, and plan of care documented in the resident's note.

## 2013-08-16 NOTE — Patient Instructions (Signed)
Follow-up with Dr. Daryll Drown in 1 month (after appointment at [facility]).   Please take all of your medicines as prescribed.  Don't forget to go back to the pharmacy to fill the phenergan to use if you are feeling nauseous again.  Glad you were able to eat a hearty breakfast this morning!  [facility] will call you soon with an appointment time.   Here are your kidney function numbers that you asked for.   On 08/15/13, your Creatinine was 4.03, GFR 12.  GFR <15 is criteria for end stage kidney disease.   Kidney Failure Kidney failure happens when the kidneys cannot remove waste and excess fluid that naturally builds up in your blood after your body breaks down food. This leads to a dangerous buildup of waste products and fluid in the blood. HOME CARE  Follow your diet as told by your doctor.  Take all medicines as told by your doctor.  Keep all of your dialysis appointments. Call if you are unable to keep an appointment. GET HELP RIGHT AWAY IF:   You make a lot more or very little pee (urine).  Your face or ankles puff up (swell).  You develop shortness of breath.  You develop weakness, feel tired, or you do not feel hungry (appetite loss).  You feel poorly for no known reason. MAKE SURE YOU:   Understand these instructions.  Will watch your condition.  Will get help right away if you are not doing well or get worse. Document Released: 09/18/2009 Document Revised: 09/16/2011 Document Reviewed: 10/25/2009 [facility] Patient Information 2014 [place], [place].

## 2013-08-16 NOTE — Assessment & Plan Note (Signed)
Long conversation with patient today about her kidneys failing, cannot be ignored, will not improve on own.  Discussed her refusal of dialysis in past, last spring with Dr. Mercy Moore at [facility].  She understands that dialysis is the only way for her to live many more years as she wishes and is willing to discuss fistula placement, etc. Called [facility] and spoke to Dr. Etheleen Nicks assistant Corky Sox.  We will fax over records, and they will facilitate scheduling an appointment.

## 2013-08-17 NOTE — Progress Notes (Signed)
Case discussed with Dr. Lilia Pro at the time of the visit.  We reviewed the resident's history and exam and pertinent patient test results.  I agree with the assessment, diagnosis, and plan of care documented in the resident's note.

## 2013-08-19 ENCOUNTER — Encounter: Payer: Self-pay | Admitting: Internal Medicine

## 2013-08-19 ENCOUNTER — Ambulatory Visit (INDEPENDENT_AMBULATORY_CARE_PROVIDER_SITE_OTHER): Payer: PRIVATE HEALTH INSURANCE | Admitting: Internal Medicine

## 2013-08-19 VITALS — BP 206/114 | HR 108 | Temp 97.4°F | Ht 63.0 in | Wt 180.6 lb

## 2013-08-19 DIAGNOSIS — I129 Hypertensive chronic kidney disease with stage 1 through stage 4 chronic kidney disease, or unspecified chronic kidney disease: Secondary | ICD-10-CM

## 2013-08-19 DIAGNOSIS — I10 Essential (primary) hypertension: Secondary | ICD-10-CM

## 2013-08-19 DIAGNOSIS — N185 Chronic kidney disease, stage 5: Secondary | ICD-10-CM

## 2013-08-19 DIAGNOSIS — Z0000 Encounter for general adult medical examination without abnormal findings: Secondary | ICD-10-CM

## 2013-08-19 DIAGNOSIS — R109 Unspecified abdominal pain: Secondary | ICD-10-CM

## 2013-08-19 NOTE — Progress Notes (Signed)
I saw and evaluated the patient.  I personally confirmed the key portions of the history and exam documented by Dr. Stann Mainland and I reviewed pertinent patient test results.  The assessment, diagnosis, and plan were formulated together and I agree with the documentation in the resident's note. Pt has lost about 20 lbs one month. Agree with S/U to R/O chronic mesenteric ischemia. [facility] referral.

## 2013-08-19 NOTE — Assessment & Plan Note (Addendum)
Refer for screening mammogram at appt next week (ideally this will be after mesenteric ultrasound). Patient now amenable to repeat colonoscopy.  Requested path report from colonoscopy in 2008.

## 2013-08-19 NOTE — Assessment & Plan Note (Signed)
Patient continues to complain of intermittent abdominal pain, now she states worse with eating/drinking.  Abdominal xrays on 08/15/13 showed prominent stool (normal bowel gas pattern).  Therefore, may be due to constipation alone; patient has stool softener and Miralax at home but has not been using them, advised her to start doing so.  However, on further chart review, patient has lost 19 pounds since 07/13/13 (approximately one month ago).  GI malignancy is possible though patient had colonoscopy in 2008.  Per colonoscopy report, she was to have repeat in 3-10 years pending path results; per patient in 5-6 years (2014).  Have requested path report from Dr. Ulyses Amor office.  No indication for swallow study or EGD at this time given no dysphagia or upper GI symptoms.  Chronic mesenteric ischemia also possible especially given patient's age, HTN, previous stroke.and worse pain after eating.  Will obtain mesenteric ultrasound to rule out; will have to contact vascular if exam is not adequate due to body habitus given that studies with contrast such as angiography would be challenging given patient's ESRD. Instructed patient to take bowel regimen per above, Tylenol prn for pain, phenergan (previously prescribed) for nausea in interim. Mesenteric duplex scheduled for 08/31/13 at [facility].

## 2013-08-19 NOTE — Progress Notes (Signed)
Patient ID: Nancy [name], female   DOB: May 26, 1941, 73 y.o.   MRN: [id number]   Subjective:   Patient ID: Nancy [name] female   DOB: 15-Dec-1940 73 y.o.   MRN: [id number]  HPI: [name] is a 73 y.o. woman with history of HTN, HL, CKD5, medication noncompliance who presents for acute visit for abdominal pain.   Patient states that diffuse abominal pain has returned (intermittent for past month).  She is no longer having nausea/vomiting though.  States that she is constipated but no blood in stool, no dark stools; last BM around 2am today- formed, brown.  Of note, while she feels hungry, she is scared to eat due to pain worsening after eating or drinking.  No dysphagia, hematochezia, melena.    Patient reports that she did not take her blood pressure medicine this morning as she has not eaten yet today.  She states that she has not missed any other doses but has not been checking her blood pressure at home.   Patient's granddaughter is concerned that [facility] told them that she may not get an appointment for 6-8 weeks.  Reassured her that it is ok to wait this amount of time.   Patient is willing to have someone come to her home to help with her medications and check her blood pressure.    Past Medical History  Diagnosis Date  . CVA (cerebrovascular accident)     New hemorrhagic per CT scan '09  . Fecal impaction   . Diverticulosis of colon   . Hypertension   . Pulmonary nodule   . Dysfunctional uterine bleeding   . Postmenopausal   . OA (osteoarthritis)     bilateral knees  . Colitis   . Bacterial sinusitis 09/17/2011  . TINEA CRURIS 01/12/2007  . HERNIORRHAPHY, HX OF 08/11/2006  . CKD (chronic kidney disease) stage 4, GFR 15-29 ml/min 08/11/2006    Cr continues to increase. Proteinuria on UA 02/10/12.     Current Outpatient Prescriptions  Medication Sig Dispense Refill  . diltiazem (CARTIA XT) 240 MG 24 hr capsule Take 1 capsule (240 mg total) by mouth daily.  30  capsule  1  . famotidine (PEPCID) 40 MG tablet take 1 tablet by mouth once daily  30 tablet  11  . promethazine (PHENERGAN) 12.5 MG tablet Take 1 tablet (12.5 mg total) by mouth every 6 (six) hours as needed for nausea or vomiting.  30 tablet  0  . Travoprost, BAK Free, (TRAVATAN) 0.004 % SOLN ophthalmic solution Place 1-2 drops into both eyes 2 (two) times daily. 1 drops in am, 2 drop in pm       No current facility-administered medications for this visit.   Family History  Problem Relation Age of Onset  . Hypertension Mother   . Heart attack Mother   . Heart disease Mother    History   Social History  . Marital Status: Married    Spouse Name: N/A    Number of Children: N/A  . Years of Education: N/A   Social History Main Topics  . Smoking status: Never Smoker   . Smokeless tobacco: Never Used  . Alcohol Use: No  . Drug Use: No     Comment: 08/15/08 UDS + cocaine  . Sexual Activity: None   Other Topics Concern  . None   Social History Narrative   Married, lives with her husband. 1 child.          Review of  Systems: Review of Systems  Constitutional: Positive for weight loss. Negative for fever.  Eyes: Positive for blurred vision.       Chronic, does not wear glasses  Respiratory: Negative for cough and shortness of breath.   Cardiovascular: Negative for chest pain, palpitations and leg swelling.  Gastrointestinal: Positive for abdominal pain and constipation. Negative for heartburn, nausea, vomiting, diarrhea and blood in stool.  Genitourinary: Negative for dysuria.  Musculoskeletal: Negative for falls and myalgias.  Neurological: Negative for dizziness, loss of consciousness, weakness and headaches.    Objective:  Physical Exam: Filed Vitals:   08/19/13 0935  BP: 206/114  Pulse: 108  Temp: 97.4 F (36.3 C)  TempSrc: Oral  Height: 5\' 3"  (1.6 m)  Weight: 180 lb 9.6 oz (81.92 kg)  SpO2: 100%   General: alert, cooperative, and in no apparent distress HEENT:  NCAT, vision grossly intact, oropharynx clear and non-erythematous  Neck: supple, no lymphadenopathy Lungs: clear to ascultation bilaterally, normal work of respiration, no wheezes, rales, ronchi Heart: regular rate and rhythm, no murmurs, gallops, or rubs Abdomen: mild diffuse TTP, soft, non-distended, normal bowel sounds Extremities: 2+ DP/PT pulses bilaterally, no cyanosis, clubbing, or edema Neurologic: alert & oriented X3, cranial nerves II-XII intact, strength grossly intact, sensation intact to light touch  Assessment & Plan:  Patient discussed with Dr. Lynnae January.  Please see problem-based assessment and plan.

## 2013-08-19 NOTE — Patient Instructions (Addendum)
Follow-up with Dr. Stann Mainland in 1 week for BP check and to review ultrasound results.   PLEASE TAKE YOUR MEDICINES AS PRESCRIBED.  This is very important for your blood pressure.   We are ordering an ultrasound of your belly.  This will help [place] understand more about possible causes of your abdominal pain.   We are also referring you to [facility].  They will contact you about having someone come to your home to help with checking blood pressure and managing your medicines.   The kidney doctors will call you with an appointment time as soon as possible.  It is ok to wait on this for a couple of months.

## 2013-08-19 NOTE — Assessment & Plan Note (Signed)
Awaiting follow-up appt at [facility].

## 2013-08-19 NOTE — Assessment & Plan Note (Addendum)
BP significantly elevated today, but patient did not take her medication this morning and likely pain component.  She was previously controlled on diltiazem alone (see 2/9 note).  Therefore, will not add agent today.  Follow-up in 1 week.  [facility] referral placed (patient and her family amenable).

## 2013-08-22 ENCOUNTER — Other Ambulatory Visit: Payer: Self-pay

## 2013-08-22 ENCOUNTER — Encounter (HOSPITAL_COMMUNITY): Payer: Self-pay | Admitting: Emergency Medicine

## 2013-08-22 ENCOUNTER — Emergency Department (HOSPITAL_COMMUNITY)
Admission: EM | Admit: 2013-08-22 | Discharge: 2013-08-22 | Disposition: A | Discharge status: Home / Self Care | Payer: PRIVATE HEALTH INSURANCE | Attending: Emergency Medicine | Admitting: Emergency Medicine

## 2013-08-22 DIAGNOSIS — Z9889 Other specified postprocedural states: Secondary | ICD-10-CM | POA: Insufficient documentation

## 2013-08-22 DIAGNOSIS — N184 Chronic kidney disease, stage 4 (severe): Secondary | ICD-10-CM | POA: Insufficient documentation

## 2013-08-22 DIAGNOSIS — Z8619 Personal history of other infectious and parasitic diseases: Secondary | ICD-10-CM | POA: Insufficient documentation

## 2013-08-22 DIAGNOSIS — Z8709 Personal history of other diseases of the respiratory system: Secondary | ICD-10-CM | POA: Insufficient documentation

## 2013-08-22 DIAGNOSIS — R109 Unspecified abdominal pain: Secondary | ICD-10-CM

## 2013-08-22 DIAGNOSIS — R1012 Left upper quadrant pain: Secondary | ICD-10-CM | POA: Insufficient documentation

## 2013-08-22 DIAGNOSIS — Z8673 Personal history of transient ischemic attack (TIA), and cerebral infarction without residual deficits: Secondary | ICD-10-CM | POA: Insufficient documentation

## 2013-08-22 DIAGNOSIS — Z8742 Personal history of other diseases of the female genital tract: Secondary | ICD-10-CM | POA: Insufficient documentation

## 2013-08-22 DIAGNOSIS — Z79899 Other long term (current) drug therapy: Secondary | ICD-10-CM | POA: Insufficient documentation

## 2013-08-22 DIAGNOSIS — K5900 Constipation, unspecified: Secondary | ICD-10-CM | POA: Insufficient documentation

## 2013-08-22 DIAGNOSIS — IMO0002 Reserved for concepts with insufficient information to code with codable children: Secondary | ICD-10-CM | POA: Insufficient documentation

## 2013-08-22 DIAGNOSIS — R1084 Generalized abdominal pain: Secondary | ICD-10-CM | POA: Insufficient documentation

## 2013-08-22 DIAGNOSIS — R630 Anorexia: Secondary | ICD-10-CM | POA: Insufficient documentation

## 2013-08-22 DIAGNOSIS — M1710 Unilateral primary osteoarthritis, unspecified knee: Secondary | ICD-10-CM | POA: Insufficient documentation

## 2013-08-22 DIAGNOSIS — I129 Hypertensive chronic kidney disease with stage 1 through stage 4 chronic kidney disease, or unspecified chronic kidney disease: Secondary | ICD-10-CM | POA: Insufficient documentation

## 2013-08-22 DIAGNOSIS — Z90710 Acquired absence of both cervix and uterus: Secondary | ICD-10-CM | POA: Insufficient documentation

## 2013-08-22 DIAGNOSIS — Z9089 Acquired absence of other organs: Secondary | ICD-10-CM | POA: Insufficient documentation

## 2013-08-22 DIAGNOSIS — R112 Nausea with vomiting, unspecified: Secondary | ICD-10-CM | POA: Insufficient documentation

## 2013-08-22 LAB — COMPREHENSIVE METABOLIC PANEL
ALT: 10 U/L (ref 0–35)
AST: 14 U/L (ref 0–37)
Albumin: 3.5 g/dL (ref 3.5–5.2)
Alkaline Phosphatase: 52 U/L (ref 39–117)
BUN: 32 mg/dL — ABNORMAL HIGH (ref 6–23)
CALCIUM: 9.3 mg/dL (ref 8.4–10.5)
CO2: 24 meq/L (ref 19–32)
CREATININE: 4.41 mg/dL — AB (ref 0.50–1.10)
Chloride: 103 mEq/L (ref 96–112)
GFR calc Af Amer: 11 mL/min — ABNORMAL LOW (ref >90)
GFR, EST NON AFRICAN AMERICAN: 9 mL/min — AB (ref >90)
Glucose, Bld: 112 mg/dL — ABNORMAL HIGH (ref 70–99)
Potassium: 4.5 mEq/L (ref 3.7–5.3)
SODIUM: 142 meq/L (ref 137–147)
Total Bilirubin: 0.3 mg/dL (ref 0.3–1.2)
Total Protein: 7.1 g/dL (ref 6.0–8.3)

## 2013-08-22 LAB — CBC WITH DIFFERENTIAL/PLATELET
BASOS ABS: 0 10*3/uL (ref 0.0–0.1)
Basophils Relative: 0 % (ref 0–1)
EOS ABS: 0 10*3/uL (ref 0.0–0.7)
EOS PCT: 1 % (ref 0–5)
HCT: 34.7 % — ABNORMAL LOW (ref 36.0–46.0)
Hemoglobin: 11.5 g/dL — ABNORMAL LOW (ref 12.0–15.0)
LYMPHS PCT: 20 % (ref 12–46)
Lymphs Abs: 0.8 10*3/uL (ref 0.7–4.0)
MCH: 29.9 pg (ref 26.0–34.0)
MCHC: 33.1 g/dL (ref 30.0–36.0)
MCV: 90.4 fL (ref 78.0–100.0)
MONO ABS: 0.2 10*3/uL (ref 0.1–1.0)
Monocytes Relative: 5 % (ref 3–12)
Neutro Abs: 3.2 10*3/uL (ref 1.7–7.7)
Neutrophils Relative %: 75 % (ref 43–77)
Platelets: 273 10*3/uL (ref 150–400)
RBC: 3.84 MIL/uL — ABNORMAL LOW (ref 3.87–5.11)
RDW: 14 % (ref 11.5–15.5)
WBC: 4.2 10*3/uL (ref 4.0–10.5)

## 2013-08-22 LAB — URINALYSIS, ROUTINE W REFLEX MICROSCOPIC
GLUCOSE, UA: NEGATIVE mg/dL
Ketones, ur: 15 mg/dL — AB
LEUKOCYTES UA: NEGATIVE
Nitrite: NEGATIVE
SPECIFIC GRAVITY, URINE: 1.026 (ref 1.005–1.030)
Urobilinogen, UA: 0.2 mg/dL (ref 0.0–1.0)
pH: 6 (ref 5.0–8.0)

## 2013-08-22 LAB — URINE MICROSCOPIC-ADD ON

## 2013-08-22 LAB — CG4 I-STAT (LACTIC ACID): Lactic Acid, Venous: 0.74 mmol/L (ref 0.5–2.2)

## 2013-08-22 LAB — LIPASE, BLOOD: Lipase: 36 U/L (ref 11–59)

## 2013-08-22 MED ORDER — HYDROMORPHONE HCL PF 1 MG/ML IJ SOLN
1.0000 mg | Freq: Once | INTRAMUSCULAR | Status: AC
  Administered 2013-08-22: 1 mg via INTRAVENOUS
  Filled 2013-08-22: qty 1

## 2013-08-22 MED ORDER — ONDANSETRON HCL 4 MG/2ML IJ SOLN
4.0000 mg | Freq: Once | INTRAMUSCULAR | Status: AC
  Administered 2013-08-22: 4 mg via INTRAVENOUS
  Filled 2013-08-22: qty 2

## 2013-08-22 MED ORDER — SODIUM CHLORIDE 0.9 % IV BOLUS (SEPSIS)
1000.0000 mL | Freq: Once | INTRAVENOUS | Status: AC
  Administered 2013-08-22: 1000 mL via INTRAVENOUS

## 2013-08-22 MED ORDER — PROMETHAZINE HCL 25 MG PO TABS: 25.0000 mg | ORAL_TABLET | Freq: Four times a day (QID) | ORAL | Status: DC | PRN

## 2013-08-22 MED ORDER — DILTIAZEM HCL ER COATED BEADS 240 MG PO CP24
240.0000 mg | ORAL_CAPSULE | Freq: Every day | ORAL | Status: DC
  Administered 2013-08-22: 240 mg via ORAL
  Filled 2013-08-22: qty 1

## 2013-08-22 MED ORDER — ACETAMINOPHEN 325 MG PO TABS
650.0000 mg | ORAL_TABLET | Freq: Once | ORAL | Status: AC
  Administered 2013-08-22: 650 mg via ORAL
  Filled 2013-08-22: qty 2

## 2013-08-22 NOTE — Discharge Instructions (Signed)
Call the clinic tomorrow to arrange a followup appointment. Recheck here with pain that is worsening, leading, fevers, or any other associated symptoms.  Abdominal Pain, Adult Many things can cause abdominal pain. Usually, abdominal pain is not caused by a disease and will improve without treatment. It can often be observed and treated at home. Your health care provider will do a physical exam and possibly order blood tests and X-rays to help determine the seriousness of your pain. However, in many cases, more time must pass before a clear cause of the pain can be found. Before that point, your health care provider may not know if you need more testing or further treatment. HOME CARE INSTRUCTIONS  Monitor your abdominal pain for any changes. The following actions may help to alleviate any discomfort you are experiencing:  Only take over-the-counter or prescription medicines as directed by your health care provider.  Do not take laxatives unless directed to do so by your health care provider.  Try a clear liquid diet (broth, tea, or water) as directed by your health care provider. Slowly move to a bland diet as tolerated. SEEK MEDICAL CARE IF:  You have unexplained abdominal pain.  You have abdominal pain associated with nausea or diarrhea.  You have pain when you urinate or have a bowel movement.  You experience abdominal pain that wakes you in the night.  You have abdominal pain that is worsened or improved by eating food.  You have abdominal pain that is worsened with eating fatty foods. SEEK IMMEDIATE MEDICAL CARE IF:   Your pain does not go away within 2 hours.  You have a fever.  You keep throwing up (vomiting).  Your pain is felt only in portions of the abdomen, such as the right side or the left lower portion of the abdomen.  You pass bloody or black tarry stools. MAKE SURE YOU:  Understand these instructions.   Will watch your condition.   Will get help right away  if you are not doing well or get worse.  Document Released: 04/03/2005 Document Revised: 04/14/2013 Document Reviewed: 03/03/2013 [facility] Patient Information 2014 [place].

## 2013-08-22 NOTE — ED Notes (Signed)
Per [facility], pt is from home, pt reports waking at 0130 with suprapubic pain, increase pain to Left side, pt described her pain as a numbing pain. VSS, NAD, ambulatory to truck and into ED

## 2013-08-22 NOTE — ED Notes (Signed)
PT reports abdominal pain subsided but since the pain medication was given she has developed a HA 7/10

## 2013-08-22 NOTE — ED Provider Notes (Signed)
CSN: [id number]     Arrival date & time 08/22/13  [id number] History   First MD Initiated Contact with Patient 08/22/13 1028     Chief Complaint  Patient presents with  . Abdominal Pain     (Consider location/radiation/quality/duration/timing/severity/associated sxs/prior Treatment) HPI Comments: Patient is 73 year old female who presents to the ED with continued episodic abdominal pain.  She states that she has been seen here and by her PCP and the pain still returns.  She states the pain is from suprapubic area and radiates to under her left breast.  The pain is associated with multiple episodes of NBNB vomiting, she reports two episodes this morning.  She is unable to keep down food and fluid.  She has been seen here 5 times previously and her PCP 2-3 times.  She denies fever, chills, cough, congestion, chest pain.  She also denies diarrhea and notes from PCP reveal complaints of constipation.  She denies dysuria, hematuria, vaginal discharge or bleeding.  Patient is a 73 y.o. female presenting with abdominal pain. The history is provided by the patient. No language interpreter was used.  Abdominal Pain Pain location:  Generalized Pain quality: bloating, gnawing, sharp and shooting   Pain radiates to:  Chest and LUQ Pain severity:  Moderate Onset quality:  Gradual Duration:  4 weeks Timing:  Intermittent Progression:  Worsening Chronicity:  New Context: not diet changes, not eating, not previous surgeries, not recent travel, not sick contacts and not suspicious food intake   Relieved by:  Nothing Worsened by:  Eating Ineffective treatments:  OTC medications, position changes and lying down Associated symptoms: anorexia, constipation, nausea and vomiting   Associated symptoms: no chest pain, no chills, no diarrhea, no dysuria, no fatigue, no fever, no hematemesis, no hematochezia, no hematuria, no melena, no shortness of breath, no vaginal bleeding and no vaginal discharge   Risk factors:  being elderly and obesity     Past Medical History  Diagnosis Date  . CVA (cerebrovascular accident)     New hemorrhagic per CT scan '09  . Fecal impaction   . Diverticulosis of colon   . Hypertension   . Pulmonary nodule   . Dysfunctional uterine bleeding   . Postmenopausal   . OA (osteoarthritis)     bilateral knees  . Colitis   . Bacterial sinusitis 09/17/2011  . TINEA CRURIS 01/12/2007  . HERNIORRHAPHY, HX OF 08/11/2006  . CKD (chronic kidney disease) stage 4, GFR 15-29 ml/min 08/11/2006    Cr continues to increase. Proteinuria on UA 02/10/12.     Past Surgical History  Procedure Laterality Date  . Inguinal hernia repair  2008  . Abdominal hysterectomy    . Cholecystectomy  2009  . Iridotomy / iridectomy      Laser, right eye 12/26/11 left eye 01/24/12  . Mass excision Left 05/07/2013    Procedure: EXCISION CYST;  Surgeon: Myrtha Mantis., MD;  Location: [place];  Service: Ophthalmology;  Laterality: Left;   Family History  Problem Relation Age of Onset  . Hypertension Mother   . Heart attack Mother   . Heart disease Mother    History  Substance Use Topics  . Smoking status: Never Smoker   . Smokeless tobacco: Never Used  . Alcohol Use: No   OB History   Grav Para Term Preterm Abortions TAB SAB Ect Mult Living                 Review of  Systems  Constitutional: Negative for fever, chills and fatigue.  Respiratory: Negative for shortness of breath.   Cardiovascular: Negative for chest pain.  Gastrointestinal: Positive for nausea, vomiting, abdominal pain, constipation and anorexia. Negative for diarrhea, melena, hematochezia and hematemesis.  Genitourinary: Negative for dysuria, hematuria, vaginal bleeding and vaginal discharge.  All other systems reviewed and are negative.      Allergies  Hydrocodone-acetaminophen  Home Medications   Current Outpatient Rx  Name  Route  Sig  Dispense  Refill  . acetaminophen (TYLENOL) 500 MG  tablet   Oral   Take 500 mg by mouth every 6 (six) hours as needed for mild pain, moderate pain, fever or headache.         . diltiazem (CARTIA XT) 240 MG 24 hr capsule   Oral   Take 1 capsule (240 mg total) by mouth daily.   30 capsule   1   . famotidine (PEPCID) 40 MG tablet      take 1 tablet by mouth once daily   30 tablet   11   . Travoprost, BAK Free, (TRAVATAN) 0.004 % SOLN ophthalmic solution   Both Eyes   Place 1-2 drops into both eyes 2 (two) times daily. 1 drops in am, 2 drop in pm          BP 197/95  Pulse 93  Temp(Src) 98.4 F (36.9 C) (Oral)  Resp 17  SpO2 94% Physical Exam  Nursing note and vitals reviewed. Constitutional: She is oriented to person, place, and time. She appears well-developed and well-nourished. No distress.  HENT:  Head: Normocephalic and atraumatic.  Right Ear: External ear normal.  Left Ear: External ear normal.  Nose: Nose normal.  Mouth/Throat: No oropharyngeal exudate.  Dry oral mucosa  Eyes: Conjunctivae are normal. Pupils are equal, round, and reactive to light. No scleral icterus.  Neck: Normal range of motion. Neck supple.  Cardiovascular: Normal rate, regular rhythm and normal heart sounds.  Exam reveals no gallop and no friction rub.   No murmur heard. Pulmonary/Chest: Effort normal and breath sounds normal. No respiratory distress. She has no wheezes. She has no rales. She exhibits no tenderness.  Abdominal: Soft. Bowel sounds are normal. She exhibits no distension and no mass. There is tenderness in the suprapubic area and left upper quadrant. There is guarding. There is no rebound, no CVA tenderness, no tenderness at McBurney's point and negative Murphy's sign.    Obese  Musculoskeletal: Normal range of motion. She exhibits no edema and no tenderness.  Lymphadenopathy:    She has no cervical adenopathy.  Neurological: She is alert and oriented to person, place, and time. She exhibits normal muscle tone. Coordination  normal.  Skin: Skin is warm and dry. No rash noted. No erythema. No pallor.  Psychiatric: She has a normal mood and affect. Her behavior is normal. Judgment and thought content normal.    ED Course  Procedures (including critical care time) Labs Review Labs Reviewed  CBC WITH DIFFERENTIAL - Abnormal; Notable for the following:    RBC 3.84 (*)    Hemoglobin 11.5 (*)    HCT 34.7 (*)    All other components within normal limits  URINALYSIS, ROUTINE W REFLEX MICROSCOPIC - Abnormal; Notable for the following:    APPearance CLOUDY (*)    Hgb urine dipstick SMALL (*)    Bilirubin Urine SMALL (*)    Ketones, ur 15 (*)    Protein, ur >300 (*)    All other components  within normal limits  URINE MICROSCOPIC-ADD ON - Abnormal; Notable for the following:    Squamous Epithelial / LPF MANY (*)    Bacteria, UA FEW (*)    Casts GRANULAR CAST (*)    All other components within normal limits  COMPREHENSIVE METABOLIC PANEL  LIPASE, BLOOD   Imaging Review No results found.  EKG Interpretation   None      Results for orders placed during the hospital encounter of 08/22/13  CBC WITH DIFFERENTIAL      Result Value Ref Range   WBC 4.2  4.0 - 10.5 K/uL   RBC 3.84 (*) 3.87 - 5.11 MIL/uL   Hemoglobin 11.5 (*) 12.0 - 15.0 g/dL   HCT 34.7 (*) 36.0 - 46.0 %   MCV 90.4  78.0 - 100.0 fL   MCH 29.9  26.0 - 34.0 pg   MCHC 33.1  30.0 - 36.0 g/dL   RDW 14.0  11.5 - 15.5 %   Platelets 273  150 - 400 K/uL   Neutrophils Relative % 75  43 - 77 %   Neutro Abs 3.2  1.7 - 7.7 K/uL   Lymphocytes Relative 20  12 - 46 %   Lymphs Abs 0.8  0.7 - 4.0 K/uL   Monocytes Relative 5  3 - 12 %   Monocytes Absolute 0.2  0.1 - 1.0 K/uL   Eosinophils Relative 1  0 - 5 %   Eosinophils Absolute 0.0  0.0 - 0.7 K/uL   Basophils Relative 0  0 - 1 %   Basophils Absolute 0.0  0.0 - 0.1 K/uL  COMPREHENSIVE METABOLIC PANEL      Result Value Ref Range   Sodium 142  137 - 147 mEq/L   Potassium 4.5  3.7 - 5.3 mEq/L    Chloride 103  96 - 112 mEq/L   CO2 24  19 - 32 mEq/L   Glucose, Bld 112 (*) 70 - 99 mg/dL   BUN 32 (*) 6 - 23 mg/dL   Creatinine, Ser 4.41 (*) 0.50 - 1.10 mg/dL   Calcium 9.3  8.4 - 10.5 mg/dL   Total Protein 7.1  6.0 - 8.3 g/dL   Albumin 3.5  3.5 - 5.2 g/dL   AST 14  0 - 37 U/L   ALT 10  0 - 35 U/L   Alkaline Phosphatase 52  39 - 117 U/L   Total Bilirubin 0.3  0.3 - 1.2 mg/dL   GFR calc non Af Amer 9 (*) >90 mL/min   GFR calc Af Amer 11 (*) >90 mL/min  LIPASE, BLOOD      Result Value Ref Range   Lipase 36  11 - 59 U/L  URINALYSIS, ROUTINE W REFLEX MICROSCOPIC      Result Value Ref Range   Color, Urine YELLOW  YELLOW   APPearance CLOUDY (*) CLEAR   Specific Gravity, Urine 1.026  1.005 - 1.030   pH 6.0  5.0 - 8.0   Glucose, UA NEGATIVE  NEGATIVE mg/dL   Hgb urine dipstick SMALL (*) NEGATIVE   Bilirubin Urine SMALL (*) NEGATIVE   Ketones, ur 15 (*) NEGATIVE mg/dL   Protein, ur >300 (*) NEGATIVE mg/dL   Urobilinogen, UA 0.2  0.0 - 1.0 mg/dL   Nitrite NEGATIVE  NEGATIVE   Leukocytes, UA NEGATIVE  NEGATIVE  URINE MICROSCOPIC-ADD ON      Result Value Ref Range   Squamous Epithelial / LPF MANY (*) RARE   WBC, UA 0-2  <3 WBC/hpf  RBC / HPF 3-6  <3 RBC/hpf   Bacteria, UA FEW (*) RARE   Casts GRANULAR CAST (*) NEGATIVE  CG4 I-STAT (LACTIC ACID)      Result Value Ref Range   Lactic Acid, Venous 0.74  0.5 - 2.2 mmol/L   Ct Abdomen Pelvis Wo Contrast  07/26/2013   CLINICAL DATA:  Diffuse abdominal pain, nausea and vomiting  EXAM: CT ABDOMEN AND PELVIS WITHOUT CONTRAST  TECHNIQUE: Multidetector CT imaging of the abdomen and pelvis was performed following the standard protocol without intravenous contrast.  COMPARISON:  CT abdomen pelvis - 07/13/2012  FINDINGS: The lack of intravenous contrast limits the ability to evaluate solid abdominal organs.  Normal hepatic contour.  Post cholecystectomy.  No ascites.  Normal noncontrast appearance of the bilateral kidneys. No definite renal  stones. No stones are seen along the expected course of either ureter or the urinary bladder. Normal noncontrast appearance of the urinary bladder given under distention. The urinary obstruction or perinephric stranding. Normal noncontrast appearance of the bilateral adrenal glands and spleen. The pancreas is largely fatty replaced.  Ingestion enteric contrast extends to the level of the hepatic flexure of the colon. Moderate colonic stool burden without evidence of obstruction. Colonic diverticulosis without evidence of diverticulitis. Small hiatal hernia. Normal noncontrast appearance of the appendix. No pneumoperitoneum, pneumatosis or portal venous gas.  Scattered minimal atherosclerotic plaque within a normal caliber abdominal aorta. No bulky retroperitoneal, mesenteric, pelvic or inguinal lymphadenopathy on this noncontrast examination.  A degenerating partially calcified fibroid is noted within the fundus of the uterus. No discrete adnexal lesion on this noncontrast examination. No free fluid within the pelvis.  Limited visualization of the lower thorax demonstrates grossly unchanged punctate (5 and 4 mm) nodules within the imaged right lower lobe (both nodule seen on image 5, series 3) grossly unchanged since the 08/2008 examination and thus of benign etiology. No pleural effusion. No focal airspace opacities.  Normal heart size. Trace amount of pericardial fluid, likely physiologic. Calcifications within the aortic valve leaflets and mitral valve annulus. Coronary artery calcifications.  No acute or aggressive osseus abnormalities. Mild-to-moderate multilevel lumbar spine DDD, likely worse at L5-S1 with disc space height loss, endplate irregularity and sclerosis. Regional soft tissues appear normal.  IMPRESSION: 1. No explanation for patient's diffuse abdominal pain, nausea and vomiting. Specifically, no evidence of nephrolithiasis, urinary or enteric obstruction. Normal noncontrast appearance of the  appendix. 2. Colonic diverticulosis without definite evidence of diverticulitis on this noncontrast examination. 3. Small hiatal hernia. 4. Degenerating partially calcified fibroid within the fundus of the uterus. 5. Punctate nodules within the imaged right lower lobe, unchanged since the 2010 examination and breast of benign etiology.   Electronically Signed   By: Sandi Mariscal M.D.   On: 07/26/2013 20:53   Dg Abd 1 View  07/26/2013   CLINICAL DATA:  Umbilical pain, emesis  EXAM: ABDOMEN - 1 VIEW  COMPARISON:  Prior radiograph from 08/17/2012  FINDINGS: Gaseous worsened scattered within serve nondilated loops of bowel within the abdomen. No evidence of obstruction or ileus. No free intraperitoneal air. Calcified fibroid overlies the mid pelvis. No soft tissue mass.  Degenerative changes noted within the visualized spine. Cholecystectomy clips overlie the right upper quadrant.  IMPRESSION: No radiographic evidence of acute intra-abdominal process. Nonobstructive bowel gas pattern.   Electronically Signed   By: Jeannine Boga M.D.   On: 07/26/2013 17:14   Dg Abd Acute W/chest  08/15/2013   CLINICAL DATA:  Abdominal pain, nausea and vomiting.  EXAM: ACUTE ABDOMEN SERIES (ABDOMEN 2 VIEW & CHEST 1 VIEW)  COMPARISON:  Previous examinations.  FINDINGS: Again demonstrated is a poor inspiration with a grossly normal sized heart. Clear lungs. Unremarkable bones.  Normal bowel gas pattern without free peritoneal air. Cholecystectomy clips. Mildly prominent stool. Coarse calcification in the right pelvis, compatible with a calcified uterine fibroid. Lumbar spine degenerative changes.  IMPRESSION: No acute abnormality.  Mildly prominent stool.   Electronically Signed   By: Enrique Sack M.D.   On: 08/15/2013 09:38      MDM   Abdominal pain Vomiting  Patient returns again today with continued pain and vomiting.  She has a month and 1/2 history of this and is concerning for possible for chronic ischemic mesenteric  bowel versus possible GI malignancy.  She is again stable in terms of labs (creatnine and BUN close to her baseline), she is pain free after pain medication and has not vomited here.  I have discussed this patient with Dr. Jeneen Rinks who has seen her and we do not feel that she is a candidate for admission at this time.  Will discharge home with pain and nausea medication and she will follow up [facility] - she has a mesenteric [place] scheduled for 2/24 as well.    Idalia Needle Joelyn Oms, PA-C 08/22/13 1317  [place] Joelyn Oms, PA-C 08/22/13 1517

## 2013-08-22 NOTE — ED Provider Notes (Addendum)
Patient seen and evaluated. History reviewed. Patient examined. I agree with the assessment as above per Wille Glaser PA.  Vision is pain-free. Her abdominal exam now is benign. Edwena Bunde asked him to see her in followup hopefully expedite colonoscopy. No abnormalities of lab today that would suggest ischemia or anemia. Abdominal exam does not suggest acute surgical condition the patient is appropriate for outpatient treatment as above.  Tanna Furry, MD 08/22/13 [place], MD 08/22/13 [phone]

## 2013-08-22 NOTE — ED Notes (Signed)
PT reports abdominal pain 10/10 from suprapubic region radiating to left side of abdomen and terminates under L breast. PT reports she has had similar pain before but it had radiated to her back on 07/26/13. Reports of n/v, emesis x2. Denies coffee ground or blood in emesis. PT states she's been in the ED 5x for similar pain.

## 2013-08-25 ENCOUNTER — Encounter (HOSPITAL_COMMUNITY): Payer: Self-pay | Admitting: Emergency Medicine

## 2013-08-25 ENCOUNTER — Emergency Department (HOSPITAL_COMMUNITY)
Admission: EM | Admit: 2013-08-25 | Discharge: 2013-08-25 | Disposition: A | Discharge status: Home / Self Care | Payer: PRIVATE HEALTH INSURANCE | Attending: Emergency Medicine | Admitting: Emergency Medicine

## 2013-08-25 ENCOUNTER — Emergency Department (HOSPITAL_COMMUNITY): Payer: PRIVATE HEALTH INSURANCE

## 2013-08-25 DIAGNOSIS — Z90710 Acquired absence of both cervix and uterus: Secondary | ICD-10-CM | POA: Insufficient documentation

## 2013-08-25 DIAGNOSIS — Z8619 Personal history of other infectious and parasitic diseases: Secondary | ICD-10-CM | POA: Insufficient documentation

## 2013-08-25 DIAGNOSIS — Z9089 Acquired absence of other organs: Secondary | ICD-10-CM | POA: Insufficient documentation

## 2013-08-25 DIAGNOSIS — R109 Unspecified abdominal pain: Secondary | ICD-10-CM

## 2013-08-25 DIAGNOSIS — IMO0002 Reserved for concepts with insufficient information to code with codable children: Secondary | ICD-10-CM | POA: Insufficient documentation

## 2013-08-25 DIAGNOSIS — I129 Hypertensive chronic kidney disease with stage 1 through stage 4 chronic kidney disease, or unspecified chronic kidney disease: Secondary | ICD-10-CM | POA: Insufficient documentation

## 2013-08-25 DIAGNOSIS — Z9889 Other specified postprocedural states: Secondary | ICD-10-CM | POA: Insufficient documentation

## 2013-08-25 DIAGNOSIS — N184 Chronic kidney disease, stage 4 (severe): Secondary | ICD-10-CM | POA: Insufficient documentation

## 2013-08-25 DIAGNOSIS — M1710 Unilateral primary osteoarthritis, unspecified knee: Secondary | ICD-10-CM | POA: Insufficient documentation

## 2013-08-25 DIAGNOSIS — Z79899 Other long term (current) drug therapy: Secondary | ICD-10-CM | POA: Insufficient documentation

## 2013-08-25 DIAGNOSIS — Z8719 Personal history of other diseases of the digestive system: Secondary | ICD-10-CM | POA: Insufficient documentation

## 2013-08-25 DIAGNOSIS — Z8673 Personal history of transient ischemic attack (TIA), and cerebral infarction without residual deficits: Secondary | ICD-10-CM | POA: Insufficient documentation

## 2013-08-25 DIAGNOSIS — Z8742 Personal history of other diseases of the female genital tract: Secondary | ICD-10-CM | POA: Insufficient documentation

## 2013-08-25 LAB — URINALYSIS, ROUTINE W REFLEX MICROSCOPIC
Bilirubin Urine: NEGATIVE
Glucose, UA: NEGATIVE mg/dL
Ketones, ur: NEGATIVE mg/dL
Leukocytes, UA: NEGATIVE
Nitrite: NEGATIVE
SPECIFIC GRAVITY, URINE: 1.018 (ref 1.005–1.030)
Urobilinogen, UA: 0.2 mg/dL (ref 0.0–1.0)
pH: 6 (ref 5.0–8.0)

## 2013-08-25 LAB — CBC WITH DIFFERENTIAL/PLATELET
Basophils Absolute: 0 10*3/uL (ref 0.0–0.1)
Basophils Relative: 1 % (ref 0–1)
EOS ABS: 0 10*3/uL (ref 0.0–0.7)
EOS PCT: 1 % (ref 0–5)
HCT: 32.1 % — ABNORMAL LOW (ref 36.0–46.0)
HEMOGLOBIN: 10.7 g/dL — AB (ref 12.0–15.0)
Lymphocytes Relative: 24 % (ref 12–46)
Lymphs Abs: 1.1 10*3/uL (ref 0.7–4.0)
MCH: 30.1 pg (ref 26.0–34.0)
MCHC: 33.3 g/dL (ref 30.0–36.0)
MCV: 90.4 fL (ref 78.0–100.0)
MONO ABS: 0.2 10*3/uL (ref 0.1–1.0)
MONOS PCT: 5 % (ref 3–12)
Neutro Abs: 3.3 10*3/uL (ref 1.7–7.7)
Neutrophils Relative %: 70 % (ref 43–77)
Platelets: 272 10*3/uL (ref 150–400)
RBC: 3.55 MIL/uL — ABNORMAL LOW (ref 3.87–5.11)
RDW: 14 % (ref 11.5–15.5)
WBC: 4.7 10*3/uL (ref 4.0–10.5)

## 2013-08-25 LAB — URINE MICROSCOPIC-ADD ON

## 2013-08-25 LAB — BASIC METABOLIC PANEL
BUN: 29 mg/dL — ABNORMAL HIGH (ref 6–23)
CALCIUM: 9.3 mg/dL (ref 8.4–10.5)
CO2: 24 mEq/L (ref 19–32)
CREATININE: 4.05 mg/dL — AB (ref 0.50–1.10)
Chloride: 103 mEq/L (ref 96–112)
GFR calc Af Amer: 12 mL/min — ABNORMAL LOW (ref >90)
GFR, EST NON AFRICAN AMERICAN: 10 mL/min — AB (ref >90)
GLUCOSE: 114 mg/dL — AB (ref 70–99)
Potassium: 3.8 mEq/L (ref 3.7–5.3)
Sodium: 141 mEq/L (ref 137–147)

## 2013-08-25 MED ORDER — MORPHINE SULFATE 4 MG/ML IJ SOLN
6.0000 mg | Freq: Once | INTRAMUSCULAR | Status: AC
  Administered 2013-08-25: 6 mg via INTRAVENOUS
  Filled 2013-08-25: qty 2

## 2013-08-25 MED ORDER — DILTIAZEM HCL ER COATED BEADS 240 MG PO CP24
240.0000 mg | ORAL_CAPSULE | Freq: Every day | ORAL | Status: DC
  Administered 2013-08-25: 240 mg via ORAL
  Filled 2013-08-25: qty 1

## 2013-08-25 MED ORDER — IOHEXOL 300 MG/ML  SOLN
25.0000 mL | Freq: Once | INTRAMUSCULAR | Status: AC | PRN
Start: 2013-08-25 — End: 2013-08-25
  Administered 2013-08-25: 25 mL via ORAL

## 2013-08-25 MED ORDER — HYDROCODONE-ACETAMINOPHEN 5-325 MG PO TABS
1.0000 | ORAL_TABLET | ORAL | Status: DC | PRN
Start: 2013-08-25 — End: 2013-08-26

## 2013-08-25 NOTE — Discharge Instructions (Signed)
Abdominal Pain, Adult °Many things can cause abdominal pain. Usually, abdominal pain is not caused by a disease and will improve without treatment. It can often be observed and treated at home. Your health care provider will do a physical exam and possibly order blood tests and X-rays to help determine the seriousness of your pain. However, in many cases, more time must pass before a clear cause of the pain can be found. Before that point, your health care provider may not know if you need more testing or further treatment. °HOME CARE INSTRUCTIONS  °Monitor your abdominal pain for any changes. The following actions may help to alleviate any discomfort you are experiencing: °· Only take over-the-counter or prescription medicines as directed by your health care provider. °· Do not take laxatives unless directed to do so by your health care provider. °· Try a clear liquid diet (broth, tea, or water) as directed by your health care provider. Slowly move to a bland diet as tolerated. °SEEK MEDICAL CARE IF: °· You have unexplained abdominal pain. °· You have abdominal pain associated with nausea or diarrhea. °· You have pain when you urinate or have a bowel movement. °· You experience abdominal pain that wakes you in the night. °· You have abdominal pain that is worsened or improved by eating food. °· You have abdominal pain that is worsened with eating fatty foods. °SEEK IMMEDIATE MEDICAL CARE IF:  °· Your pain does not go away within 2 hours. °· You have a fever. °· You keep throwing up (vomiting). °· Your pain is felt only in portions of the abdomen, such as the right side or the left lower portion of the abdomen. °· You pass bloody or black tarry stools. °MAKE SURE YOU: °· Understand these instructions.   °· Will watch your condition.   °· Will get help right away if you are not doing well or get worse.   °Document Released: 04/03/2005 Document Revised: 04/14/2013 Document Reviewed: 03/03/2013 °[employer]® Patient  Information ©2014 [employer]. ° °

## 2013-08-25 NOTE — ED Provider Notes (Signed)
CSN: [id number]     Arrival date & time 08/25/13  0910 History   First MD Initiated Contact with Patient 08/25/13 0912     Chief Complaint  Patient presents with  . Abdominal Pain  . Chest Pain     (Consider location/radiation/quality/duration/timing/severity/associated sxs/prior Treatment) HPI Patient reports developing left-sided abdominal pain as well as some mild dizziness and nausea since yesterday evening.  She states the pain starts in her left lower quadrant and radiates up towards her left chest.  She states that her left abdomen is painful when she pushes on it.  She's had no vomiting.  She denies diarrhea.  Her last bowel movement was several days ago.  She denies active chest pain at this time.  No shortness of breath.  No productive cough.  No fevers or chills.  No urinary symptoms.  No dysuria or urinary frequency.  She denies melena hematochezia.  No diarrhea.  She states she's had this pain intermittently over the past several years and now it has had an answer.   Past Medical History  Diagnosis Date  . CVA (cerebrovascular accident)     New hemorrhagic per CT scan '09  . Fecal impaction   . Diverticulosis of colon   . Hypertension   . Pulmonary nodule   . Dysfunctional uterine bleeding   . Postmenopausal   . OA (osteoarthritis)     bilateral knees  . Colitis   . Bacterial sinusitis 09/17/2011  . TINEA CRURIS 01/12/2007  . HERNIORRHAPHY, HX OF 08/11/2006  . CKD (chronic kidney disease) stage 4, GFR 15-29 ml/min 08/11/2006    Cr continues to increase. Proteinuria on UA 02/10/12.     Past Surgical History  Procedure Laterality Date  . Inguinal hernia repair  2008  . Abdominal hysterectomy    . Cholecystectomy  2009  . Iridotomy / iridectomy      Laser, right eye 12/26/11 left eye 01/24/12  . Mass excision Left 05/07/2013    Procedure: EXCISION CYST;  Surgeon: Myrtha Mantis., MD;  Location: [place];  Service: Ophthalmology;  Laterality: Left;    Family History  Problem Relation Age of Onset  . Hypertension Mother   . Heart attack Mother   . Heart disease Mother    History  Substance Use Topics  . Smoking status: Never Smoker   . Smokeless tobacco: Never Used  . Alcohol Use: No   OB History   Grav Para Term Preterm Abortions TAB SAB Ect Mult Living                 Review of Systems  All other systems reviewed and are negative.      Allergies  Hydrocodone-acetaminophen  Home Medications   Current Outpatient Rx  Name  Route  Sig  Dispense  Refill  . acetaminophen (TYLENOL) 500 MG tablet   Oral   Take 500 mg by mouth every 6 (six) hours as needed for mild pain, moderate pain, fever or headache.         . diltiazem (CARTIA XT) 240 MG 24 hr capsule   Oral   Take 1 capsule (240 mg total) by mouth daily.   30 capsule   1   . famotidine (PEPCID) 40 MG tablet   Oral   Take 40 mg by mouth daily.         . Travoprost, BAK Free, (TRAVATAN) 0.004 % SOLN ophthalmic solution   Both Eyes   Place 1-2 drops into  both eyes 2 (two) times daily. 1 drops in am, 2 drop in pm          BP 203/98  Pulse 101  Temp(Src) 98.2 F (36.8 C) (Oral)  Resp 15  Wt 181 lb (82.101 kg)  SpO2 100% Physical Exam  Nursing note and vitals reviewed. Constitutional: She is oriented to person, place, and time. She appears well-developed and well-nourished. No distress.  HENT:  Head: Normocephalic and atraumatic.  Eyes: EOM are normal.  Neck: Normal range of motion.  Cardiovascular: Normal rate, regular rhythm and normal heart sounds.   Pulmonary/Chest: Effort normal and breath sounds normal.  Abdominal: Soft. She exhibits no distension.  Mild left-sided abdominal tenderness.  No guarding or rebound.  Musculoskeletal: Normal range of motion.  Neurological: She is alert and oriented to person, place, and time.  Skin: Skin is warm and dry.  Psychiatric: She has a normal mood and affect. Judgment normal.    ED Course   Procedures (including critical care time) Labs Review Labs Reviewed  CBC WITH DIFFERENTIAL - Abnormal; Notable for the following:    RBC 3.55 (*)    Hemoglobin 10.7 (*)    HCT 32.1 (*)    All other components within normal limits  BASIC METABOLIC PANEL - Abnormal; Notable for the following:    Glucose, Bld 114 (*)    BUN 29 (*)    Creatinine, Ser 4.05 (*)    GFR calc non Af Amer 10 (*)    GFR calc Af Amer 12 (*)    All other components within normal limits  URINALYSIS, ROUTINE W REFLEX MICROSCOPIC   Imaging Review No results found.  ECG interpretation   Date: 08/25/2013  Rate: 97  Rhythm: normal sinus rhythm  QRS Axis: normal  Intervals: normal  ST/T Wave abnormalities: normal  Conduction Disutrbances: none  Narrative Interpretation:   Old EKG Reviewed: No significant changes noted     MDM   Final diagnoses:  None    Patient feels much better after pain medication.  Discharge home in good condition.  Atypical recurrent left-sided abdominal pain.  CT scan without abnormality.  The radiation of her abdominal pain radiated to her left chest.  This is not a presentation of ACS.    Hoy Morn, MD 08/25/13 [phone]

## 2013-08-25 NOTE — ED Notes (Signed)
Pt experiencing abd pain and dizziness since yesterday pain 10/10. Pain starts in left groin and moves up to left chest. Left abd is painful to palpation. Pt is nauseous, but no vomiting, diarrhea. Last bowel movement was Monday. EKG sinus tach HR 100, 180/100 BP.

## 2013-08-26 ENCOUNTER — Ambulatory Visit (INDEPENDENT_AMBULATORY_CARE_PROVIDER_SITE_OTHER): Payer: PRIVATE HEALTH INSURANCE | Admitting: Internal Medicine

## 2013-08-26 ENCOUNTER — Encounter (HOSPITAL_COMMUNITY): Payer: Self-pay | Admitting: General Practice

## 2013-08-26 ENCOUNTER — Encounter: Payer: Self-pay | Admitting: Internal Medicine

## 2013-08-26 ENCOUNTER — Observation Stay (HOSPITAL_COMMUNITY)
Admission: AD | Admit: 2013-08-26 | Discharge: 2013-08-27 | Disposition: A | Discharge status: Home / Self Care | Payer: PRIVATE HEALTH INSURANCE | Source: Ambulatory Visit | Attending: Internal Medicine | Admitting: Internal Medicine

## 2013-08-26 VITALS — BP 181/93 | HR 95 | Temp 97.1°F | Ht 63.0 in | Wt 177.6 lb

## 2013-08-26 DIAGNOSIS — R011 Cardiac murmur, unspecified: Secondary | ICD-10-CM | POA: Diagnosis present

## 2013-08-26 DIAGNOSIS — I120 Hypertensive chronic kidney disease with stage 5 chronic kidney disease or end stage renal disease: Secondary | ICD-10-CM

## 2013-08-26 DIAGNOSIS — IMO0002 Reserved for concepts with insufficient information to code with codable children: Secondary | ICD-10-CM | POA: Insufficient documentation

## 2013-08-26 DIAGNOSIS — K5900 Constipation, unspecified: Secondary | ICD-10-CM

## 2013-08-26 DIAGNOSIS — M1710 Unilateral primary osteoarthritis, unspecified knee: Secondary | ICD-10-CM | POA: Insufficient documentation

## 2013-08-26 DIAGNOSIS — N186 End stage renal disease: Secondary | ICD-10-CM

## 2013-08-26 DIAGNOSIS — R109 Unspecified abdominal pain: Secondary | ICD-10-CM

## 2013-08-26 DIAGNOSIS — H409 Unspecified glaucoma: Secondary | ICD-10-CM | POA: Insufficient documentation

## 2013-08-26 DIAGNOSIS — K219 Gastro-esophageal reflux disease without esophagitis: Secondary | ICD-10-CM | POA: Diagnosis present

## 2013-08-26 DIAGNOSIS — N185 Chronic kidney disease, stage 5: Secondary | ICD-10-CM

## 2013-08-26 DIAGNOSIS — R51 Headache: Secondary | ICD-10-CM | POA: Insufficient documentation

## 2013-08-26 DIAGNOSIS — I517 Cardiomegaly: Secondary | ICD-10-CM | POA: Diagnosis present

## 2013-08-26 DIAGNOSIS — Z9119 Patient's noncompliance with other medical treatment and regimen: Secondary | ICD-10-CM | POA: Insufficient documentation

## 2013-08-26 DIAGNOSIS — R1084 Generalized abdominal pain: Secondary | ICD-10-CM | POA: Insufficient documentation

## 2013-08-26 DIAGNOSIS — Z91199 Patient's noncompliance with other medical treatment and regimen due to unspecified reason: Secondary | ICD-10-CM | POA: Insufficient documentation

## 2013-08-26 DIAGNOSIS — K5730 Diverticulosis of large intestine without perforation or abscess without bleeding: Secondary | ICD-10-CM | POA: Insufficient documentation

## 2013-08-26 DIAGNOSIS — I951 Orthostatic hypotension: Secondary | ICD-10-CM | POA: Insufficient documentation

## 2013-08-26 DIAGNOSIS — I10 Essential (primary) hypertension: Secondary | ICD-10-CM

## 2013-08-26 DIAGNOSIS — E785 Hyperlipidemia, unspecified: Secondary | ICD-10-CM | POA: Insufficient documentation

## 2013-08-26 DIAGNOSIS — D259 Leiomyoma of uterus, unspecified: Secondary | ICD-10-CM | POA: Insufficient documentation

## 2013-08-26 DIAGNOSIS — I160 Hypertensive urgency: Secondary | ICD-10-CM

## 2013-08-26 DIAGNOSIS — Z8673 Personal history of transient ischemic attack (TIA), and cerebral infarction without residual deficits: Secondary | ICD-10-CM | POA: Insufficient documentation

## 2013-08-26 DIAGNOSIS — H4020X0 Unspecified primary angle-closure glaucoma, stage unspecified: Secondary | ICD-10-CM | POA: Insufficient documentation

## 2013-08-26 HISTORY — DX: Headache: R51

## 2013-08-26 LAB — RAPID URINE DRUG SCREEN, HOSP PERFORMED
Amphetamines: NOT DETECTED
BENZODIAZEPINES: NOT DETECTED
Barbiturates: NOT DETECTED
Cocaine: NOT DETECTED
Opiates: POSITIVE — AB
TETRAHYDROCANNABINOL: NOT DETECTED

## 2013-08-26 MED ORDER — SENNA-DOCUSATE SODIUM 8.6-50 MG PO TABS: 2.0000 | ORAL_TABLET | Freq: Every day | ORAL | Status: DC

## 2013-08-26 MED ORDER — SUCRALFATE 1 GM/10ML PO SUSP
1.0000 g | Freq: Three times a day (TID) | ORAL | Status: DC
  Administered 2013-08-26: 1 g via ORAL
  Filled 2013-08-26 (×2): qty 10

## 2013-08-26 MED ORDER — FUROSEMIDE 10 MG/ML IJ SOLN
40.0000 mg | Freq: Once | INTRAMUSCULAR | Status: AC
  Administered 2013-08-26: 40 mg via INTRAVENOUS
  Filled 2013-08-26 (×2): qty 4

## 2013-08-26 MED ORDER — AMLODIPINE BESYLATE 5 MG PO TABS
5.0000 mg | ORAL_TABLET | Freq: Every day | ORAL | Status: DC
  Administered 2013-08-26 – 2013-08-27 (×2): 5 mg via ORAL
  Filled 2013-08-26 (×2): qty 1

## 2013-08-26 MED ORDER — AMLODIPINE BESYLATE 5 MG PO TABS
5.0000 mg | ORAL_TABLET | Freq: Every day | ORAL | Status: DC
Start: 2013-08-26 — End: 2013-08-26

## 2013-08-26 MED ORDER — HEPARIN SODIUM (PORCINE) 5000 UNIT/ML IJ SOLN
5000.0000 [IU] | Freq: Three times a day (TID) | INTRAMUSCULAR | Status: DC
  Administered 2013-08-26 – 2013-08-27 (×2): 5000 [IU] via SUBCUTANEOUS
  Filled 2013-08-26 (×5): qty 1

## 2013-08-26 MED ORDER — LATANOPROST 0.005 % OP SOLN
1.0000 [drp] | Freq: Two times a day (BID) | OPHTHALMIC | Status: DC
  Administered 2013-08-26: 1 [drp] via OPHTHALMIC
  Filled 2013-08-26 (×2): qty 2.5

## 2013-08-26 MED ORDER — DILTIAZEM HCL ER COATED BEADS 240 MG PO CP24: 240.0000 mg | ORAL_CAPSULE | Freq: Every day | ORAL | Status: DC

## 2013-08-26 NOTE — ED Provider Notes (Signed)
Medical screening examination/treatment/procedure(s) were conducted as a shared visit with non-physician practitioner(s) and myself.  I personally evaluated the patient during the encounter.  EKG Interpretation   None       Patient seen and examined. The history is reviewed. Intermittent episodes of similar pain or last several months.She is asymptomatic her. Her pain it appears to not strike me as the classic "pain out of proportion". However, she would be at risk for ischemic colitis.  Her abdomen is benign. No guarding no rebound no tenderness at this time. Normal lactate and reassuring labs here and is asymptomatic and quite desirous of being discharged home. This is appropriate. His given GI referral and asked to contact her primary care for preauthorization for this.  Tanna Furry, MD 08/26/13 [phone]

## 2013-08-26 NOTE — Patient Instructions (Addendum)
Follow-up with Dr. Stann Mainland in 1 week for blood pressure check.  Please continue checking your blood pressure in the meantime.  We are starting you on a new blood pressure medicine called AMLODIPINE today.  Please get this filled immediately and start taking it.  You should also be able to fill your pain and nausea medicines if needed.  I will also send it in a stool softener-laxative medicine called SENOKOT to take as needed for constipation.    You have [facility] coming to your home to help with medicines and blood pressure checks tomorrow.  You also have an abdominal ultrasound scheduled on 2/24, don't forget to attend that appointment.

## 2013-08-26 NOTE — H&P (Signed)
Date: 08/26/2013               Patient Name:  Nancy [name] MRN: [id number]  DOB: [birth year] Age / Sex: 73 y.o., female   PCP: Sid Falcon, MD         Medical Service: Internal Medicine Teaching Service         Attending Physician: Dr. Karren Cobble, MD    First Contact: Dr. Ronnald Ramp Pager: [phone]  Second Contact: Dr. Eulas Post Pager: [phone]       After Hours (After 5p/  First Contact Pager: [phone]  weekends / holidays): Second Contact Pager: [phone]   Chief Complaint: Uncontrolled blood pressure  History of Present Illness:  [name] is a 73 y.o. female w/ PMHx of CVA, CKD stage V, HTN, HLD, and Diverticulosis, presents from clinic w/ hypertensive urgency and abdominal pain. Today, she presented to the clinic w/ BP of 197/91 and reports good compliance w/ her Cardizem (started taking in 02/2013 2/2 cocaine abuse) at home, however, she states that her medication makes her feel dizzy and weak. The patient was also seen in the ED yesterday for abdominal pain and her BP was also elevated to 198/90. In the clinic, she also had orthostatic vital signs. The patient denies any recent changes in her vision, no nausea, vomiting, LOC, chest pain, SOB, or palpitations.  The patient also complains of abdominal pain, which seems to be an ongoing issue for her since January. She says the pain is relatively diffuse in nature, not associated w/ food. She says she has not had any recent diarrhea, melena, or hematochezia, but does admit to chronic constipation, saying that her last bowel movement was 3 days ago, which is normal for her. She says she has nausea and vomiting at times, and claims she vomited yesterday, which was bilious in nature. She says her appetite has been okay lately, but has lost 19 lbs in the last 1-2 months. A CT abdomen pelvis was performed in the ED on 08/25/13, which showed no acute abnormalities, only chronic changes (atrophic kidneys, uterine fibroid, distal colonic  diverticulosis).  Meds: Current Facility-Administered Medications  Medication Dose Route Frequency Provider Last Rate Last Dose  . furosemide (LASIX) injection 40 mg  40 mg Intravenous Once Neema Bobbie Stack, MD      . heparin injection 5,000 Units  5,000 Units Subcutaneous 3 times per day Neema Bobbie Stack, MD      . latanoprost (XALATAN) 0.005 % ophthalmic solution 1 drop  1 drop Both Eyes BID Neema Bobbie Stack, MD        Allergies: Allergies as of 08/26/2013 - Review Complete 08/26/2013  Allergen Reaction Noted  . Hydrocodone-acetaminophen Nausea And Vomiting and Other (See Comments)    Past Medical History  Diagnosis Date  . CVA (cerebrovascular accident)     New hemorrhagic per CT scan '09  . Fecal impaction   . Diverticulosis of colon   . Hypertension   . Pulmonary nodule   . Dysfunctional uterine bleeding   . Postmenopausal   . OA (osteoarthritis)     bilateral knees  . Colitis   . Bacterial sinusitis 09/17/2011  . TINEA CRURIS 01/12/2007  . HERNIORRHAPHY, HX OF 08/11/2006  . CKD (chronic kidney disease) stage 4, GFR 15-29 ml/min 08/11/2006    Cr continues to increase. Proteinuria on UA 02/10/12.    Marland Kitchen [id number])    Past Surgical History  Procedure Laterality Date  . Inguinal hernia repair  2008  . Abdominal hysterectomy    . Cholecystectomy  2009  . Iridotomy / iridectomy      Laser, right eye 12/26/11 left eye 01/24/12  . Mass excision Left 05/07/2013    Procedure: EXCISION CYST;  Surgeon: Myrtha Mantis., MD;  Location: [facility];  Service: Ophthalmology;  Laterality: Left;   Family History  Problem Relation Age of Onset  . Hypertension Mother   . Heart attack Mother   . Heart disease Mother    History   Social History  . Marital Status: Married    Spouse Name: N/A    Number of Children: N/A  . Years of Education: N/A   Occupational History  . Not on file.   Social History Main Topics  . Smoking status: Never Smoker   . Smokeless  tobacco: Never Used  . Alcohol Use: No  . Drug Use: No     Comment: 08/15/08 UDS + cocaine  . Sexual Activity: Not on file   Other Topics Concern  . Not on file   Social History Narrative   Married, lives with her husband. 1 child.           Review of Systems: General: Positive for weight loss. Denies fever, chills, diaphoresis, appetite change and fatigue.  Respiratory: Denies SOB, DOE, cough, chest tightness, and wheezing.   Cardiovascular: Denies chest pain and palpitations.  Gastrointestinal: Positive for nausea, vomiting, abdominal pain, constipation. Denies diarrhea, blood in stool and abdominal distention.  Genitourinary: Denies dysuria, urgency, frequency, hematuria, and flank pain. Endocrine: Denies hot or cold intolerance, polyuria, and polydipsia. Musculoskeletal: Denies myalgias, back pain, joint swelling, arthralgias and gait problem.  Skin: Denies pallor, rash and wounds.  Neurological: Positive for dizziness, lightheadedness, headaches, and weakness. Denies seizures, syncope, numbness.  Psychiatric/Behavioral: Denies mood changes, confusion, nervousness, sleep disturbance and agitation.  Physical Exam: Filed Vitals:   08/26/13 1147 08/26/13 1355 08/26/13 1454 08/26/13 1556  BP: 170/71 182/78  186/79  Pulse: 80 93  92  Temp: 98.2 F (36.8 C) 97.6 F (36.4 C) 98.1 F (36.7 C) 98.1 F (36.7 C)  TempSrc:   Oral   Resp: 18 18  17   Height: 5\' 3"  (1.6 m)     Weight: 172 lb 9.6 oz (78.291 kg)     SpO2: 100% 100%  99%  General: Vital signs reviewed.  Patient is a well-developed and well-nourished, in no acute distress and cooperative with exam.  Head: Normocephalic and atraumatic. Eyes: PERRL, EOMI, conjunctivae normal, No scleral icterus.  Neck: Supple, trachea midline, normal ROM, No JVD, masses, thyromegaly, or carotid bruit present.  Cardiovascular: RRR, S1 normal, S2 normal, II/VI systolic murmur, gallops, or rubs. Pulmonary/Chest: Air entry equal bilaterally,  mild bibasilar crackles, no wheezes. Abdominal: Soft, mildly tender diffusely, non-distended, BS +, no masses, organomegaly, or guarding present.  Musculoskeletal: No joint deformities, erythema, or stiffness, ROM full and nontender. Extremities: No swelling or edema,  pulses symmetric and intact bilaterally. No cyanosis or clubbing. Right calf varicosity.  Neurological: A&O x3, Strength is normal and symmetric bilaterally, cranial nerve II-XII are grossly intact, no focal motor deficit, sensory intact to light touch bilaterally.  Skin: Warm, dry and intact. No rashes or erythema. Psychiatric: Normal mood and affect. speech and behavior is normal. Cognition and memory are normal.   Lab results: Basic Metabolic Panel:  Recent Labs  08/25/13 0930  NA 141  K 3.8  CL 103  CO2 24  GLUCOSE 114*  BUN 29*  CREATININE 4.05*  CALCIUM 9.3   CBC:  Recent Labs  08/25/13 0930  WBC 4.7  NEUTROABS 3.3  HGB 10.7*  HCT 32.1*  MCV 90.4  PLT 272   Urine Drug Screen: Drugs of Abuse     Component Value Date/Time   LABOPIA POSITIVE* 08/15/2013 0950   COCAINSCRNUR NONE DETECTED 08/15/2013 0950   COCAINSCRNUR NEG 09/21/2009 2031   LABBENZ NONE DETECTED 08/15/2013 0950   LABBENZ NEG 09/21/2009 2031   AMPHETMU NONE DETECTED 08/15/2013 0950   AMPHETMU NEG 09/21/2009 2031   THCU NONE DETECTED 08/15/2013 0950   LABBARB NONE DETECTED 08/15/2013 0950    Urinalysis:  Recent Labs  08/25/13 1055  COLORURINE YELLOW  LABSPEC 1.018  PHURINE 6.0  GLUCOSEU NEGATIVE  HGBUR TRACE*  BILIRUBINUR NEGATIVE  KETONESUR NEGATIVE  PROTEINUR >300*  UROBILINOGEN 0.2  NITRITE NEGATIVE  LEUKOCYTESUR NEGATIVE   Imaging results:  Ct Abdomen Pelvis Wo Contrast  08/25/2013   CLINICAL DATA:  Abdominal pain.  Renal insufficiency  EXAM: CT ABDOMEN AND PELVIS WITHOUT CONTRAST  TECHNIQUE: Multidetector CT imaging of the abdomen and pelvis was performed following the standard protocol without intravenous contrast.  COMPARISON:   07/26/2013  FINDINGS: BODY WALL: Unremarkable.  LOWER CHEST: Aortic valvular calcification. Small pericardial effusion, stable from prior. There pulmonary nodules in the bilateral lower lungs, at least 2 in the right lower lobe measuring up to 5 mm, and 1 in the left lower lobe measuring 5 mm. These are stable from recent imaging: the right-sided lower lobe nodules are unchanged from 2010. The left lower lobe nodules stable since 07/13/2012.  ABDOMEN/PELVIS:  Liver: No focal abnormality.  Biliary: Cholecystectomy.  Pancreas: Unremarkable.  Spleen: Unremarkable.  Adrenals: Unremarkable.  Kidneys and ureters: Symmetrically atrophic bilateral kidneys with lobulation. No hydronephrosis.  Bladder: Unremarkable.  Reproductive: There is a 4 cm mass exophytic from the left uterine fundus, stable from priors and consistent with fibroid.  Bowel: No obstruction. Normal appendix. Distal colonic diverticulosis.  Retroperitoneum: No mass or adenopathy.  Peritoneum: No free fluid or gas.  Vascular: No acute abnormality.  OSSEOUS: No acute abnormalities.  IMPRESSION: 1. No acute intra-abdominal abnormality. 2. Chronic findings are stable from prior and described above.   Electronically Signed   By: Jorje Guild M.D.   On: 08/25/2013 12:32    Other results: EKG: NSR, LAE  Assessment & Plan by Problem: [name] is a 73 y.o. female w/ PMHx of CVA, CKD stage V, HTN, HLD, and Diverticulosis, presents from clinic w/ uncontrolled BP and abdominal pain.  Uncontrolled Blood Pressure- Patient w/ BP in the [id number] systolic, claims to be complaint with her Diltiazem but states that her medication makes her dizzy. Patient has had many issues with compliance in the past, and used to be on more medications but she was not taking them. Patient states that she has headaches and dizziness at times, but there is no evidence of organ dysfunction or damage. Orthostatic in the clinic, however, symptoms do not correlate w/ BP  changes. Likely some autonomic dysfunction. Patient also w/ CKD stage V, however, Cr is near baseline (4.0). Mild bibasilar crackles on exam, otherwise the patient does not appear significantly volume overloaded.  -Admitted to med-surg - Repeat orthostatics on admission 113/98 (lying) --> 148/70 (sitting) --> 144/61 (standing). Based on diastolic blood pressure, patient does technically meet orthostatic criteria.  -Given Lasix 40 mg IV -Amlodipine 5 mg qd -UDS pending; patient w/ cocaine abuse history  Abdominal pain- Patient complains of  very non-specific abdominal pain, diffuse in nature, not associated w/ food, w/ some nausea and vomiting, and recent 15-20 lb weight loss. Recent CT abdomen pelvis shows no acute abnormality. Mesenteric [place] scheduled by PCP for 08/31/13 to rule out mesenteric ischemia. Patient without leukocytosis, normal lactic acid on 08/22/13. AG 14 on BMP from 08/25/13. No history of diarrhea, hematochezia, or melena. Patient also without other signs of vascular complications; no ulcerations on distal extremities, no recent change in vision.  -Regular diet for now -Carafate 1 g po tid -Further workup as described above on outpatient basis  CKD stage V- Patient has followed w/ Dr. Mercy Moore in the past, however, stopped seeing him for personal reasons. Patient still makes urine, BMP without significant electrolyte abnormalities. Cr elevated at 4.05, but at baseline (~4.0). UA showed >300 protein, trace Hb, hyaline casts, many squames and few bacteria. CT abdomen pelvis from 2/18 shows symmetrically atrophic bilateral kidneys with lobulation and no hydronephrosis. -CMP in the AM -Further workup regarding future HD being managed by PCP as an outpatient. Referral in place w/ [facility] -If decreased renal function during admission, will consider renal consult  DVT/PE PPx- Heparin [place]  Dispo: Disposition is deferred at this time, awaiting improvement of current medical problems.  Anticipated discharge in approximately 1-2 day(s).   The patient does have a current PCP Sid Falcon, MD) and does need an [facility] hospital follow-up appointment after discharge.  The patient does not have transportation limitations that hinder transportation to clinic appointments.  Signed: Corky Sox, MD 08/26/2013, 1:08 PM  Pager: [phone]

## 2013-08-26 NOTE — Progress Notes (Signed)
Report was called to [place] on 6700.  Patient transported to [facility].  Sander Nephew, RN  08/26/2013 11:28 AM

## 2013-08-26 NOTE — Progress Notes (Signed)
Patient ID: Nancy [name], female   DOB: Aug 15, 1940, 73 y.o.   MRN: [id number]   Subjective:   Patient ID: Nancy [name] Date female   DOB: 03-Mar-1941 73 y.o.   MRN: [id number]  HPI: [name] is a 73 y.o. woman with history of HTN, HL, CKD5, recent weight loss, medication noncompliance who presents for BP follow-up.   BP 197/91 today, 198/90 in ED yesterday.  Patient reports good compliance with her home diltiazem though states that she sometimes feels "dizzy and weak" after taking it.  She does also report mild headaches as well but no chest pain or nausea.  Of note, she was started on diltiazem alone in 02/2013 during admission for chest pain 2/2 cocaine use.   Orthostatics in clinic positive: BP 192/99, HR 95 lying --> 182/95, HR 95 sitting (patient felt "woozy" from lying to sitting) --> 146/86, HR 95 standing (patient asymptomatic from sitting to standing).    Patient has been to the ED twice since I last saw her on 08/19/12, both times for abdominal pain.  She had CT abdomen and pelvis in ED yesterday which was negative except for chronic changes (atrophic kidneys, uterine fibroid, distal colonic diverticulosis).  She was discharged with rx for Vicodin which she has not filled.  Negative UDS on 07/26/13, UDS + for opiates on 08/15/13 (we have not prescribed patient opiates though perhaps she got them in ED prior to UDS).   At our last visit, patient stated she was only having abdominal pain after eating so much so that she is scared to eat.  She has had >20 pound weight loss since 07/13/13.  Given concern for chronic mesenteric ischemia, mesenteric ultrasound scheduled on 2/24. Today she says abdominal pain comes on randomly, this morning started after she took her bath before she had eaten.  She told nursing that she was having 9/10 abdominal pain but then told me she is not having any pain (abdominal or otherwise).  She does feel constipated, reportedly has not had BM since Monday 2/16.  She  does have history of diverticulosis and fecal impaction.  She states she had a colonoscopy 7-8 years ago with Dr. Benson Norway, we requested these records at last visit.  Past Medical History  Diagnosis Date  . CVA (cerebrovascular accident)     New hemorrhagic per CT scan '09  . Fecal impaction   . Diverticulosis of colon   . Hypertension   . Pulmonary nodule   . Dysfunctional uterine bleeding   . Postmenopausal   . OA (osteoarthritis)     bilateral knees  . Colitis   . Bacterial sinusitis 09/17/2011  . TINEA CRURIS 01/12/2007  . HERNIORRHAPHY, HX OF 08/11/2006  . CKD (chronic kidney disease) stage 4, GFR 15-29 ml/min 08/11/2006    Cr continues to increase. Proteinuria on UA 02/10/12.     Current Outpatient Prescriptions  Medication Sig Dispense Refill  . acetaminophen (TYLENOL) 500 MG tablet Take 500 mg by mouth every 6 (six) hours as needed for mild pain, moderate pain, fever or headache.      Marland Kitchen amLODipine (NORVASC) 5 MG tablet Take 1 tablet (5 mg total) by mouth daily.  30 tablet  1  . diltiazem (CARTIA XT) 240 MG 24 hr capsule Take 1 capsule (240 mg total) by mouth daily.  30 capsule  1  . famotidine (PEPCID) 40 MG tablet Take 40 mg by mouth daily.      Marland Kitchen HYDROcodone-acetaminophen (NORCO/VICODIN) 5-325 MG per tablet Take  1 tablet by mouth every 4 (four) hours as needed for moderate pain.  15 tablet  0  . sennosides-docusate sodium (SENOKOT-S) 8.6-50 MG tablet Take 2 tablets by mouth daily.  30 tablet  0  . Travoprost, BAK Free, (TRAVATAN) 0.004 % SOLN ophthalmic solution Place 1-2 drops into both eyes 2 (two) times daily. 1 drops in am, 2 drop in pm       No current facility-administered medications for this visit.   Family History  Problem Relation Age of Onset  . Hypertension Mother   . Heart attack Mother   . Heart disease Mother    History   Social History  . Marital Status: Married    Spouse Name: N/A    Number of Children: N/A  . Years of Education: N/A   Social History Main  Topics  . Smoking status: Never Smoker   . Smokeless tobacco: Never Used  . Alcohol Use: No  . Drug Use: No     Comment: 08/15/08 UDS + cocaine  . Sexual Activity: None   Other Topics Concern  . None   Social History Narrative   Married, lives with her husband. 1 child.          Review of Systems: Review of Systems  Constitutional: Positive for weight loss and malaise/fatigue. Negative for fever and chills.  Eyes: Negative for blurred vision.  Respiratory: Negative for cough and shortness of breath.   Cardiovascular: Negative for chest pain, palpitations and leg swelling.  Gastrointestinal: Positive for abdominal pain and constipation. Negative for nausea, vomiting, diarrhea and blood in stool.  Genitourinary: Negative for dysuria.  Musculoskeletal: Negative for falls and myalgias.  Neurological: Positive for dizziness and headaches. Negative for loss of consciousness.       After taking diltiazem     Objective:  Physical Exam: Filed Vitals:   08/26/13 0903  BP: 197/91  Pulse: 92  Temp: 97.1 F (36.2 C)  TempSrc: Oral  Height: 5\' 3"  (1.6 m)  Weight: 177 lb 9.6 oz (80.559 kg)  SpO2: 98%    General: alert, cooperative, and in no apparent distress HEENT: NCAT, vision grossly intact, oropharynx clear and non-erythematous  Neck: supple, no lymphadenopathy Lungs: clear to ascultation bilaterally, normal work of respiration, no wheezes, rales, ronchi [id number] systolic murmur best heard at LUSB c/w mild AS, regular rate and rhythm, no gallops or rubs Abdomen: soft, ?TTP in LLQ, non-distended, no rebound or guarding, normal bowel sounds Extremities: 2+ DP/PT pulses bilaterally, no cyanosis, clubbing, or edema Neurologic: alert & oriented X3, cranial nerves II-XII intact, strength grossly intact, sensation intact to light touch  Assessment & Plan:  Patient discussed with Dr. Ellwood Dense.   Admit to IMTS med-surg observation for hypertensive urgency with orthostatic  hypotension (?asymptomatic).  Goal systolic BP [id number].  Of note, sent in rx for amlodipine 5 mg daily prior to decision to admit but she has not been taking this.   Abdominal pain possibly due to malaise 2/2 untreated ESRD but also concerning for other etiologies such as chronic mesenteric ischemia or GI malignancy given recent weight loss.  Mesenteric ultrasound scheduled for 08/31/13 to rule out chronic mesenteric ischemia (patient has multiple risk factors including age, uncontrolled HTN, previous stroke).  Have requested records from Dr. Ulyses Amor office, patient likely needs repeat outpatient colonoscopy.   Referral to [facility] for ESRD in place since 08/12/13.   [facility] referral already in place.

## 2013-08-26 NOTE — H&P (Signed)
Date: 08/26/2013               Patient Name:  Nancy [name] MRN: [id number]  DOB: Nov 10, 1940 Age / Sex: 73 y.o., female   PCP: Sid Falcon, MD              Medical Service: Internal Medicine Teaching Service              Attending Physician: Dr. Karren Cobble, MD    First Contact: Maree Krabbe, Med Student Pager: [phone]  Second Contact: Dr. Ronnald Ramp Pager: [phone]  Third Contact Dr. Eulas Post Pager: [phone]       After Hours (After 5p/  First Contact Pager: [phone]  weekends / holidays): Second Contact Pager: [phone]    Chief Complaint: Uncontrolled hypertension  History of Present Illness: Nancy [name] is a 73 y.o. woman with a past medical history of HTN, HLD, CKD stage 5, CVA, diverticulosis, and cocaine abuse who presents from IM clinic for hypertensive urgency with BP to 197/91. Patient reports compliance with her home diltiazem but sometimes feels dizzy and weak after taking it. She reports two episodes of dizziness today and orthostatic vitals in clinic were positive. She denies vision changes, chest pain, palpitations, shortness of breath, cough, peripheral edema. She endorses regular frontal headaches.  Of note, the patient's only home medication is diltiazem,  started in 02/2013 during admission for chest pain secondary to cocaine use. Per chart review all other medications including amlodipine, clonidine, atourvastatin were discontinued at that time d/t noncompliance. She has not taken home furosemide  in several months as she has stopped seeing her outpatient nephrologist.   Ms. Groven also complains of abdominal pain that has been ongoing since January and reports several ED visits for this, most recently 2/18. She has lost ~20 lbs since early January. She says the abdominal pain is not associated with eating and reports having a good appetite. The pain starts in the periumbilical area and radiates upwards to her chest. She denies dysuria and polyuria. She endorses occasional  nausea and vomiting, with one episode of green emesis yesterday. She denies diarrhea, melena, hematochezia but says that she is chronically constipated. Her last bowel movement was 08/23/13. CT abdomen pelvis was done in the ED yesterday which showed no acute intra-abdominal abnormalities. Last colonoscopy was in 2008. Outpatient workup for abdominal pain is in process.    Outpatient Medications: .  acetaminophen (TYLENOL) 500 MG tablet   500 mg every 6 (six) hours as needed for mild pain, moderate pain, fever or headache.   .  diltiazem (CARTIA XT) 240 MG 24 hr capsule  240 mg by mouth daily.   .  famotidine (PEPCID) 40 MG tablet  40 mg by mouth daily.   .  Travoprost, BAK Free, (TRAVATAN) 0.004 % SOLN ophthalmic solution  1-2 drops into both eyes 2 (two) times daily. 1 drops in am, 2 drop in pm      Allergies: -Hydrocodone-acetaminophen: nausea/vomiting/dizziness  Past Medical History: -CVA with hemorrhagic cerebellar bleed in 12/2007 -Multiple calcified pulmonary nodules first discovered in 2004, stable on several repeat CT scans (most recent 08/25/13) -Hypertension  -Previously took amlodipine 10 mg and clonidine 0.2 mg in 2013, dc'd in 02/2013 d/t noncompliance  -On lisinopril until 2010 when it was dc'd for clonidine -CKD stage 5, GFR 13-14 since 02/2012  -Was followed by Dr. Mercy Moore at [facility] until sometime last year when she chose to stop seeing him.  Per chart review discussions have been  had about the possibility of dialysis but patient not interested. Was taking  furosemide 40 mg twice a day at home but prescriptions not being filled now.  -Hyperlipidemia  -Previously took atourvastatin -Degenerative joint disease, right knee -Narrow angle glaucoma -Substance abuse- barbituates, cocaine -Diverticulosis -Uterine fibroid  Past Surgical History: Inguinal hernia repair- 2008 Hysterectomy Choleystectomy- 2009 Iridotomy/iridectomy- 2013  Family History: Heart disease-  mother HTN- mother  Social History: Patient lives at home with her husband. Has a grown child. She has never smoked and does not drink alcohol. She previously used cocaine- last use was 4 months ago. 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    Review of Systems: General: +weight loss. Denies chills, fever. Neurological: +headaches, + dizziness. Respiratory: Denies shortness of breath and cough. Cardiovascular: Denies chest pain and palpitations. Gastrointestinal: +abdominal pain, +vomiting, +constipation. Denies melena, hematochezia. Dermatological: Denies rashes and skin changes.  Physical Exam: Blood pressure 170/71, pulse 80, temperature 98.2 F (36.8 C), resp. rate 18, height 5\' 3"  (1.6 m), weight 78.291 kg (172 lb 9.6 oz), SpO2 100.00%. General: Alert and oriented well-appearing female lying in bed in NAD. Appears younger than stated age. HEENT: PERRLA, EOMI, conjunctiva anicteric and without injection. Pulmonary: Clear to auscultation bilaterally, no increased work of breathing. No chest wall tenderness or deformity. Cardiovascular: Regular rate and rhythm, S1 and S2 normal, II/VI systolic ejection murmur at LSB. No rub, or gallop appreciated. No carotid bruits or JVD. All four extremities warm, well-perfused, without cyanosis or edema. Abdomen: Bowel sounds active. Soft, mild diffuse tenderness, non-distended, no masses, no hepatosplenomegaly.  Skin: Skin color, texture, turgor normal. Small varicose vein appreciated on right calf.  Neurologic: A&O x3. CNII-XII grossly intact. Strength normal and symmetric, sensation intact throughout.  Lab results:  BMP 08/25/13  08/25/2013 09:30  Sodium 141  Potassium 3.8  Chloride 103  CO2 24  BUN 29 (H)  Creatinine 4.05 (H)  Calcium 9.3  GFR calc non Af Amer 10 (L)  GFR calc Af Amer 12 (L)  Glucose 114 (H)    CBC 08/25/13  08/25/2013 09:30  WBC 4.7  RBC 3.55 (L)  Hemoglobin 10.7 (L)  HCT 32.1 (L)  MCV 90.4    MCH 30.1  MCHC 33.3  RDW 14.0  Platelets 272    Urinalysis 08/25/13  08/25/2013 10:55  Color, Urine YELLOW  APPearance CLEAR  Specific Gravity, Urine 1.018  pH 6.0  Glucose NEGATIVE  Bilirubin Urine NEGATIVE  Ketones, ur NEGATIVE  Protein >300 (A)  Urobilinogen, UA 0.2  Nitrite NEGATIVE  Leukocytes, UA NEGATIVE  Hgb urine dipstick TRACE (A)  WBC, UA 3-6  RBC / HPF 0-2  Squamous Epithelial / LPF MANY (A)  Bacteria, UA FEW (A)  Casts HYALINE CASTS (A)   Imaging results:  EXAM: CT ABDOMEN AND PELVIS WITHOUT CONTRAST 08/25/2013 CLINICAL DATA: Abdominal pain. Renal insufficiency EXAM: CT ABDOMEN AND PELVIS WITHOUT CONTRAST TECHNIQUE: Multidetector CT imaging of the abdomen and pelvis was performed following the standard protocol without intravenous contrast. COMPARISON: 07/26/2013 FINDINGS: BODY WALL: Unremarkable. LOWER CHEST: Aortic valvular calcification. Small pericardial effusion, stable from prior. There pulmonary nodules in the bilateral lower lungs, at least 2 in the right lower lobe measuring up to 5 mm, and 1 in the left lower lobe measuring 5 mm. These are stable from recent imaging: the right-sided lower lobe nodules are unchanged from 2010. The left lower lobe nodules stable since 07/13/2012. ABDOMEN/PELVIS: Liver: No focal abnormality. Biliary: Cholecystectomy. Pancreas: Unremarkable. Spleen: Unremarkable. Adrenals:  Unremarkable. Kidneys and ureters: Symmetrically atrophic bilateral kidneys with lobulation. No hydronephrosis. Bladder: Unremarkable. Reproductive: There is a 4 cm mass exophytic from the left uterine fundus, stable from priors and consistent with fibroid. Bowel: No obstruction. Normal appendix. Distal colonic diverticulosis. Retroperitoneum: No mass or adenopathy. Peritoneum: No free fluid or gas. Vascular: No acute abnormality. OSSEOUS: No acute abnormalities. IMPRESSION: 1. No acute intra-abdominal abnormality. 2. Chronic findings are stable from prior and  described above. Electronically Signed By: Jorje Guild M.D. On: 08/25/2013 12:32   Other results: EKG from 08/25/13: Normal sinus rhythm, HR=97, normal axis, left atrial enlargement. No acute changes.  Assessment & Plan by Problem: Nancy [name] is a 73 y.o. woman with a past medical history of HTN, HLD, CKD stage 5, CVA, diverticulosis, and cocaine abuse who presents from IM clinic for hypertensive urgency1.   #Uncontrolled hypertension: Patient with BP to 197/91, headaches, and dizziness with diltiazem use. CKD stage 5 with Cr near baseline at 4.05. She reports compliance with home diltiazem but has a long history of non-compliance with many medications including lisinopril, amlodipine, lasix. Does not appear significantly volume overloaded on physical exam (no edema, mild if any crackles, no abdominal distension). Goal during hospitalization is systolic BP ~ 0000000.  -Admit to IMTS -Give Lasix 40 mg IV x1 -Start amlodipine 5 mg daily -Hold diltiazem for now -UDS given hx of cocaine use  #Orthostatic hypotension:  Orthostatic vital signs in clinic were: BP 192/99, HR 95 lying --> 182/95, HR 95 sitting (patient felt "woozy" from lying to sitting) --> 146/86, HR 95 standing (patient asymptomatic from sitting to standing). Patient describes dizziness and weakness, particularly after taking diltiazem. Patient did not appear hypovolemic on exam, making medication induced orthostatic hypotension versus autonomic dysfunction more likely etiologies. -Repeat orthostatic vitals -Hold diltiazem  -Continue to monitor  #Abdominal pain: Patient has had chronic diffuse abdominal pain and constipation with substantial ~20 weight loss in the last month. CT abdomen pelvis on 08/25/13 showed no acute process. Outpatient workup ongoing with mesenteric ultrasound scheduled for 08/31/13 to rule out chronic mesenteric ischemia (patient has multiple risk factors including age, uncontrolled HTN, previous stroke).  -Carafate  1g tid  -Ongoing workup by PCP as above  #CKD stage V: CKD stage 5 with GFR of 12, Cr near baseline at 4.05. No electrolyte abnormalities, patient making urine, no signs of significant volume overload. Patient previously followed by Dr. Mercy Moore but chose to stop seeing him. Referral to [facility] for ESRD was made on 08/12/13.  -BMP in the AM -PCP is following up referral to [facility] -If renal function declines acutely will consult nephrology  #DVT PPx: Heparin 5000 U subcutaneous q8h  #Dispo: Disposition is deferred at this time; awaiting improvement of current medical problems.   The patient does have a current PCP (MULLEN, EMILY, MD) and does need an [facility] hospital follow-up appointment after discharge.   The patient does not have transportation limitations that hinder transportation to clinic appointments.  This is a Careers information officer Note.  The care of the patient was discussed with Dr. Ronnald Ramp and the assessment and plan was formulated with their assistance.  Please see their note for official documentation of the patient encounter.   Signed: Maree Krabbe, Med Student 08/26/2013, 1:09 PM

## 2013-08-26 NOTE — Progress Notes (Signed)
Patient with severely elevated blood pressure, headache, and orthostatic giving a history of symptoms of dizziness experienced when she takes diltiazem, which is the only medication she is on. We planned to add another medication, amlodipine, however, given orthostasis and dizziness, it would be better to do it under observation as its a vasodilator. Patient has been behaving like a superutilizer of the health care system recently, and letting her go home might result in another ED visit.   Co-morbidities - CKD stage 5. Not on dialysis. Vague pains abdomen, chronic complaint, no clear etiology emerged so far.  Case discussed with Dr. Stann Mainland at the time of the visit.  We reviewed the resident's history and exam and pertinent patient test results.  I agree with the assessment, diagnosis, and plan of care documented in the resident's note.

## 2013-08-27 DIAGNOSIS — R109 Unspecified abdominal pain: Secondary | ICD-10-CM

## 2013-08-27 DIAGNOSIS — I951 Orthostatic hypotension: Secondary | ICD-10-CM

## 2013-08-27 DIAGNOSIS — I10 Essential (primary) hypertension: Secondary | ICD-10-CM

## 2013-08-27 DIAGNOSIS — N185 Chronic kidney disease, stage 5: Secondary | ICD-10-CM

## 2013-08-27 LAB — COMPREHENSIVE METABOLIC PANEL
ALK PHOS: 61 U/L (ref 39–117)
ALT: 11 U/L (ref 0–35)
AST: 12 U/L (ref 0–37)
Albumin: 3.1 g/dL — ABNORMAL LOW (ref 3.5–5.2)
BUN: 31 mg/dL — ABNORMAL HIGH (ref 6–23)
CO2: 22 mEq/L (ref 19–32)
Calcium: 8.7 mg/dL (ref 8.4–10.5)
Chloride: 103 mEq/L (ref 96–112)
Creatinine, Ser: 4.28 mg/dL — ABNORMAL HIGH (ref 0.50–1.10)
GFR calc Af Amer: 11 mL/min — ABNORMAL LOW (ref >90)
GFR calc non Af Amer: 10 mL/min — ABNORMAL LOW (ref >90)
Glucose, Bld: 100 mg/dL — ABNORMAL HIGH (ref 70–99)
POTASSIUM: 3.7 meq/L (ref 3.7–5.3)
SODIUM: 142 meq/L (ref 137–147)
TOTAL PROTEIN: 6.4 g/dL (ref 6.0–8.3)
Total Bilirubin: 0.3 mg/dL (ref 0.3–1.2)

## 2013-08-27 MED ORDER — POLYETHYLENE GLYCOL 3350 17 G PO PACK
17.0000 g | PACK | Freq: Every day | ORAL | Status: DC
  Administered 2013-08-27: 17 g via ORAL
  Filled 2013-08-27: qty 1

## 2013-08-27 MED ORDER — FUROSEMIDE 40 MG PO TABS
40.0000 mg | ORAL_TABLET | Freq: Every day | ORAL | Status: DC
Start: 2013-08-27 — End: 2013-11-21

## 2013-08-27 MED ORDER — FUROSEMIDE 40 MG PO TABS
40.0000 mg | ORAL_TABLET | Freq: Every day | ORAL | Status: DC
  Administered 2013-08-27: 40 mg via ORAL
  Filled 2013-08-27: qty 1

## 2013-08-27 MED ORDER — AMLODIPINE BESYLATE 5 MG PO TABS: 5.0000 mg | ORAL_TABLET | Freq: Every day | ORAL | Status: DC

## 2013-08-27 MED ORDER — DSS 100 MG PO CAPS: 100.0000 mg | ORAL_CAPSULE | Freq: Every day | ORAL | Status: DC

## 2013-08-27 MED ORDER — POLYETHYLENE GLYCOL 3350 17 G PO PACK: 17.0000 g | PACK | Freq: Every day | ORAL | Status: DC

## 2013-08-27 MED ORDER — DOCUSATE SODIUM 100 MG PO CAPS: 100.0000 mg | ORAL_CAPSULE | Freq: Every day | ORAL | Status: DC

## 2013-08-27 MED ORDER — ENSURE COMPLETE PO LIQD: 237.0000 mL | Freq: Two times a day (BID) | ORAL | Status: DC

## 2013-08-27 MED ORDER — FUROSEMIDE 10 MG/ML IJ SOLN
40.0000 mg | Freq: Once | INTRAMUSCULAR | Status: AC
  Administered 2013-08-27: 40 mg via INTRAVENOUS
  Filled 2013-08-27: qty 4

## 2013-08-27 NOTE — Progress Notes (Signed)
Patient discharged.  Patient educated on discharge medications, discharge instructions, and follow-up appointment.  Patient educated on hypertension, signs and symptoms, and when to call a doctor.  Patient verbalized understanding.  IV removed.  Belongings gathered.  Patient left unit via wheelchair with Nursing Student.

## 2013-08-27 NOTE — Progress Notes (Signed)
Subjective: Patient seen and examined at the bedside this morning. She reports continuing abdominal pain but is otherwise feeling well. She has a mild headache. Her blood pressure has improved with diuresis and addition of amlodipine.  Objective: Vital signs in last 24 hours: Filed Vitals:   08/26/13 1832 08/26/13 2049 08/27/13 0530 08/27/13 0902  BP: 138/64 175/70 154/73 163/84  Pulse: 101 96 97 104  Temp:  98.1 F (36.7 C) 98 F (36.7 C) 98.3 F (36.8 C)  TempSrc:  Oral Oral Oral  Resp:  18 16 18   Height:      Weight:  174 lb 8 oz (79.153 kg)    SpO2:  99% 97% 97%   Weight change:   Intake/Output Summary (Last 24 hours) at 08/27/13 1100 Last data filed at 08/27/13 0606  Gross per 24 hour  Intake    680 ml  Output   1350 ml  Net   -670 ml    Physical Exam General: Alert and oriented well-appearing female lying in bed in NAD. Appears younger than stated age.  Pulmonary: Clear to auscultation bilaterally, no increased work of breathing. No chest wall tenderness or deformity.  Cardiovascular: Regular rate and rhythm, S1 and S2 normal, II/VI systolic ejection murmur at LSB. No rub, or gallop appreciated. No carotid bruits or JVD. All four extremities warm, well-perfused, without cyanosis or edema.  Neurologic: A&O x3. CNII-XII grossly intact.    Recent Labs Lab 08/22/13 1008 08/25/13 0930 08/27/13 0350  NA 142 141 142  K 4.5 3.8 3.7  CL 103 103 103  CO2 24 24 22   GLUCOSE 112* 114* 100*  BUN 32* 29* 31*  CREATININE 4.41* 4.05* 4.28*  CALCIUM 9.3 9.3 8.7  GFRNONAA 9* 10* 10*  GFRAA 11* 12* 11*     Recent Labs Lab 08/22/13 1008 08/25/13 0930  WBC 4.2 4.7  HGB 11.5* 10.7*  HCT 34.7* 32.1*  PLT 273 272    Studies/Results: Ct Abdomen Pelvis Wo Contrast  08/25/2013   CLINICAL DATA:  Abdominal pain.  Renal insufficiency  EXAM: CT ABDOMEN AND PELVIS WITHOUT CONTRAST  TECHNIQUE: Multidetector CT imaging of the abdomen and pelvis was performed following the  standard protocol without intravenous contrast.  COMPARISON:  07/26/2013  FINDINGS: BODY WALL: Unremarkable.  LOWER CHEST: Aortic valvular calcification. Small pericardial effusion, stable from prior. There pulmonary nodules in the bilateral lower lungs, at least 2 in the right lower lobe measuring up to 5 mm, and 1 in the left lower lobe measuring 5 mm. These are stable from recent imaging: the right-sided lower lobe nodules are unchanged from 2010. The left lower lobe nodules stable since 07/13/2012.  ABDOMEN/PELVIS:  Liver: No focal abnormality.  Biliary: Cholecystectomy.  Pancreas: Unremarkable.  Spleen: Unremarkable.  Adrenals: Unremarkable.  Kidneys and ureters: Symmetrically atrophic bilateral kidneys with lobulation. No hydronephrosis.  Bladder: Unremarkable.  Reproductive: There is a 4 cm mass exophytic from the left uterine fundus, stable from priors and consistent with fibroid.  Bowel: No obstruction. Normal appendix. Distal colonic diverticulosis.  Retroperitoneum: No mass or adenopathy.  Peritoneum: No free fluid or gas.  Vascular: No acute abnormality.  OSSEOUS: No acute abnormalities.  IMPRESSION: 1. No acute intra-abdominal abnormality. 2. Chronic findings are stable from prior and described above.   Electronically Signed   By: Jorje Guild M.D.   On: 08/25/2013 12:32    Medications: I have reviewed the patient's current medications. Scheduled Meds: . amLODipine  5 mg Oral Daily  . heparin  5,000  Units Subcutaneous 3 times per day  . latanoprost  1 drop Both Eyes BID    Assessment/Plan: Ms. Nagy is a 73 y.o. woman with a past medical history of HTN, HLD, CKD stage 5, CVA, diverticulosis, and cocaine abuse who presents from IM clinic for hypertensive urgency.  #Uncontrolled hypertension: Patient with BP to 197/91 in clinic, headaches, and dizziness with diltiazem use. CKD stage 5 with Cr near baseline at 4.05. BP trending down overnight to 140s-160s/60s-80s with Lasix 40 mg IV x2 and  amlodipine 5 mg. UDS positive for opiates, negative for cocaine. Patient is medically stable for discharge today. -Transition to Lasix 40 mg po -Continue amlodipine 5 mg daily  -Given good BPs will continue to hold diltiazem in setting of dizziness   #Orthostatic hypotension: Repeat orthostatic vital signs on admission were: BP 113/98, HR 85 lying--> BP 144/61, HR 97 standing.  Orthostatic vital signs in clinic were: BP 192/99, HR 95 lying --> 182/95, HR 95 sitting (patient felt "woozy" from lying to sitting) --> 146/86, HR 95 standing (patient asymptomatic from sitting to standing). Likely autonomic dysfunction, perhaps in conjunction with medication induced orthostasis (diltiazem)  -Continue to hold diltiazem if BP stays well controlled with amlodipine alone  #Abdominal pain: Patient has had chronic diffuse abdominal pain and constipation with substantial ~20 weight loss in the last month. CT abdomen pelvis on 08/25/13 showed no acute process. Outpatient workup ongoing with mesenteric ultrasound scheduled for 08/31/13 to rule out chronic mesenteric ischemia (patient has multiple risk factors including age, uncontrolled HTN, previous stroke).  -Ongoing workup by PCP as above    #CKD stage V: CKD stage 5 with GFR of 12, Cr to 4.28 today from 4.05 on admission. No electrolyte abnormalities, patient making urine, no signs of significant volume overload. Patient previously followed by Dr. Mercy Moore but chose to stop seeing him. Referral to [facility] for ESRD was made on 08/12/13.  -PCP is following up referral to [facility]  -If renal function declines acutely will consult nephrology   #DVT PPx: Heparin 5000 U subcutaneous q8h   Contacts/follow up: Follow-up Information   Follow up with Ivin Poot, MD On 09/02/2013. (3:30)    Specialty:  Internal Medicine   Contact information:   [place] [id number] [phone]       This is a Medical Student Note.  The care  of the patient was discussed with Dr. Ronnald Ramp and the assessment and plan formulated with their assistance.  Please see their attached note for official documentation of the daily encounter.   LOS: 1 day   [employer], Med Student 08/27/2013, 11:00 AM

## 2013-08-27 NOTE — Progress Notes (Signed)
Subjective: Patient seen at beside this AM. Feels better today. Still with intermittent abdominal pain, but denies any further headache, dizziness, lightheadedness, fever, chills, nausea, and vomiting. Appetite is good.   No crackles on pulmonary exam today. BP stable.   Objective: Vital signs in last 24 hours: Filed Vitals:   08/26/13 1832 08/26/13 2049 08/27/13 0530 08/27/13 0902  BP: 138/64 175/70 154/73 163/84  Pulse: 101 96 97 104  Temp:  98.1 F (36.7 C) 98 F (36.7 C) 98.3 F (36.8 C)  TempSrc:  Oral Oral Oral  Resp:  18 16 18   Height:      Weight:  174 lb 8 oz (79.153 kg)    SpO2:  99% 97% 97%   Weight change:   Intake/Output Summary (Last 24 hours) at 08/27/13 1039 Last data filed at 08/27/13 0606  Gross per 24 hour  Intake    680 ml  Output   1350 ml  Net   -670 ml   Physical Exam: General: Alert, cooperative, NAD. HEENT: PERRL, EOMI. Moist mucus membranes Neck: Full range of motion without pain, supple, no lymphadenopathy or carotid bruits Lungs: Air entry equal bilaterally. no wheezing or crackles. No cough.  Heart: RRR, II/VI systolic murmur. No gallops or rubs Abdomen: Soft, mildly tender in the periumbilical region, non-distended, BS + Extremities: No cyanosis, clubbing, or edema Neurologic: Alert & oriented X3, cranial nerves II-XII intact, strength grossly intact, sensation intact to light touch  Lab Results: Basic Metabolic Panel:  Recent Labs Lab 08/25/13 0930 08/27/13 0350  NA 141 142  K 3.8 3.7  CL 103 103  CO2 24 22  GLUCOSE 114* 100*  BUN 29* 31*  CREATININE 4.05* 4.28*  CALCIUM 9.3 8.7   Liver Function Tests:  Recent Labs Lab 08/22/13 1008 08/27/13 0350  AST 14 12  ALT 10 11  ALKPHOS 52 61  BILITOT 0.3 0.3  PROT 7.1 6.4  ALBUMIN 3.5 3.1*    Recent Labs Lab 08/22/13 1008  LIPASE 36   CBC:  Recent Labs Lab 08/22/13 1008 08/25/13 0930  WBC 4.2 4.7  NEUTROABS 3.2 3.3  HGB 11.5* 10.7*  HCT 34.7* 32.1*  MCV  90.4 90.4  PLT 273 272   Cardiac Enzymes: No results found for this basename: CKTOTAL, CKMB, CKMBINDEX, TROPONINI,  in the last 168 hours BNP: No results found for this basename: PROBNP,  in the last 168 hours D-Dimer: No results found for this basename: DDIMER,  in the last 168 hours CBG: No results found for this basename: GLUCAP,  in the last 168 hours Hemoglobin A1C: No results found for this basename: HGBA1C,  in the last 168 hours Fasting Lipid Panel: No results found for this basename: CHOL, HDL, LDLCALC, TRIG, CHOLHDL, LDLDIRECT,  in the last 168 hours Thyroid Function Tests: No results found for this basename: TSH, T4TOTAL, FREET4, T3FREE, THYROIDAB,  in the last 168 hours Coagulation: No results found for this basename: LABPROT, INR,  in the last 168 hours Anemia Panel: No results found for this basename: VITAMINB12, FOLATE, FERRITIN, TIBC, IRON, RETICCTPCT,  in the last 168 hours Urine Drug Screen: Drugs of Abuse     Component Value Date/Time   LABOPIA POSITIVE* 08/26/2013 1832   COCAINSCRNUR NONE DETECTED 08/26/2013 1832   COCAINSCRNUR NEG 09/21/2009 2031   [place] DETECTED 08/26/2013 1832   LABBENZ NEG 09/21/2009 2031   AMPHETMU NONE DETECTED 08/26/2013 1832   AMPHETMU NEG 09/21/2009 2031   THCU NONE DETECTED 08/26/2013 1832  LABBARB NONE DETECTED 08/26/2013 1832    Urinalysis:  Recent Labs Lab 08/22/13 0941 08/25/13 1055  COLORURINE YELLOW YELLOW  LABSPEC 1.026 1.018  PHURINE 6.0 6.0  GLUCOSEU NEGATIVE NEGATIVE  HGBUR SMALL* TRACE*  BILIRUBINUR SMALL* NEGATIVE  KETONESUR 15* NEGATIVE  PROTEINUR >300* >300*  UROBILINOGEN 0.2 0.2  NITRITE NEGATIVE NEGATIVE  LEUKOCYTESUR NEGATIVE NEGATIVE   Studies/Results: Ct Abdomen Pelvis Wo Contrast  08/25/2013   CLINICAL DATA:  Abdominal pain.  Renal insufficiency  EXAM: CT ABDOMEN AND PELVIS WITHOUT CONTRAST  TECHNIQUE: Multidetector CT imaging of the abdomen and pelvis was performed following the standard protocol  without intravenous contrast.  COMPARISON:  07/26/2013  FINDINGS: BODY WALL: Unremarkable.  LOWER CHEST: Aortic valvular calcification. Small pericardial effusion, stable from prior. There pulmonary nodules in the bilateral lower lungs, at least 2 in the right lower lobe measuring up to 5 mm, and 1 in the left lower lobe measuring 5 mm. These are stable from recent imaging: the right-sided lower lobe nodules are unchanged from 2010. The left lower lobe nodules stable since 07/13/2012.  ABDOMEN/PELVIS:  Liver: No focal abnormality.  Biliary: Cholecystectomy.  Pancreas: Unremarkable.  Spleen: Unremarkable.  Adrenals: Unremarkable.  Kidneys and ureters: Symmetrically atrophic bilateral kidneys with lobulation. No hydronephrosis.  Bladder: Unremarkable.  Reproductive: There is a 4 cm mass exophytic from the left uterine fundus, stable from priors and consistent with fibroid.  Bowel: No obstruction. Normal appendix. Distal colonic diverticulosis.  Retroperitoneum: No mass or adenopathy.  Peritoneum: No free fluid or gas.  Vascular: No acute abnormality.  OSSEOUS: No acute abnormalities.  IMPRESSION: 1. No acute intra-abdominal abnormality. 2. Chronic findings are stable from prior and described above.   Electronically Signed   By: Jorje Guild M.D.   On: 08/25/2013 12:32   Medications: I have reviewed the patient's current medications. Scheduled Meds: . amLODipine  5 mg Oral Daily  . heparin  5,000 Units Subcutaneous 3 times per day  . latanoprost  1 drop Both Eyes BID   Assessment/Plan: Ms. [place] E Kerekes is a 73 y.o. female w/ PMHx of CVA, CKD stage V, HTN, HLD, and Diverticulosis, presents from clinic w/ uncontrolled BP and abdominal pain.   Uncontrolled Blood Pressure- Patient w/ frequent history of non-compliance, takes Diltiazem at home, however, this causes her dizziness. BP in the clinic yesterday 190's/90's. Significantly improved from yesterday, 154/73 this morning. Still orthostatic after  admission, likely d/t autonomic dysfunction.  -Continue Lasix 40 mg po qd on discharge -Continue Amlodipine 5 mg qd for now in place of Diltiazem.   Abdominal pain- Patient complains of very non-specific abdominal pain, diffuse in nature, not associated w/ food, w/ some nausea and vomiting, and recent 15-20 lb weight loss. Recent CT abdomen pelvis shows no acute abnormality. Mesenteric [place] scheduled by PCP for 08/31/13 to rule out mesenteric ischemia. May also be 2/2 constipation type IBS, however, other causes of abdominal pain must be ruled out first. Patient also w/out BM for 3 days.  -Miralax + Colace -Further workup as described above on outpatient basis   CKD stage V- Patient has followed w/ Dr. Mercy Moore in the past, however, stopped seeing him for personal reasons. Patient still makes urine, BMP without significant electrolyte abnormalities. CT abdomen pelvis from 2/18 shows symmetrically atrophic bilateral kidneys with lobulation and no hydronephrosis. Cr at baseline (4.0). -Further workup regarding future HD being managed by PCP as an outpatient. Referral in place w/ [facility]   Dispo: Disposition is deferred at this time, awaiting  improvement of current medical problems.  Anticipated discharge in approximately 1-2 day(s).   The patient does have a current PCP Sid Falcon, MD) and does need an [facility] hospital follow-up appointment after discharge.  The patient does not have transportation limitations that hinder transportation to clinic appointments.  .Services Needed at time of discharge: Y = Yes, Blank = No PT:   OT:   RN:   Equipment:   Other:     LOS: 1 day   Corky Sox, MD 08/27/2013, 10:39 AM Pager: [phone]

## 2013-08-27 NOTE — Progress Notes (Signed)
   CARE MANAGEMENT NOTE 08/27/2013  Patient:  Nancy [name], Nancy [name]   Account Number:  [id number]  Date Initiated:  08/27/2013  Documentation initiated by:  Lizabeth Leyden  Subjective/Objective Assessment:   admitted with hypertensive urgency     Action/Plan:   unable to afford medications   Anticipated DC Date:  08/27/2013   Anticipated DC Plan:  [place]  CM consult      Choice offered to / List presented to:             Status of service:  Completed, signed off Medicare Important Message given?   (If response is "NO", the following Medicare IM given date fields will be blank) Date Medicare IM given:   Date Additional Medicare IM given:    Discharge Disposition:  HOME/SELF CARE  Per UR Regulation:    If discussed at Long Length of Stay Meetings, dates discussed:    Comments:  08/27/2013  [place], [place] met with patient and family regarding cost of medications. She has [employer] and Medicaid for medications coverage, with a $1.26 co pay.  She denies any issue with cost of medications, before admission she had difficulty with coverage for Zofran, it required prior auth.

## 2013-08-27 NOTE — H&P (Signed)
Internal Medicine Attending Admission Note Date: 08/27/2013  Patient name: Nancy [name] Medical record number: [id number] Date of birth: 12-27-1940 Age: 73 y.o. Gender: female  I saw and evaluated the patient. I reviewed the resident's note and I agree with the resident's findings and plan as documented in the resident's note.  [name] is a 73 year old woman with a history of chronic kidney disease stage V currently not on hemodialysis, hypertension, hyperlipidemia, prior CVA, chronic abdominal pain, and history of medication noncompliance who presented to clinic yesterday complaining of dizziness, weakness, and continued abdominal pain. She was found to have a blood pressure of 197/91 in the clinic and was admitted to the internal medicine teaching service for further evaluation and care. She states that she feels the diltiazem, her only antihypertensive medication that she is taking, makes her dizzy. She denies any palpitations, chest pain, nausea, vomiting, near-syncope or syncope, vision changes, or shortness of breath. She does have an occasional headache. Her abdominal pain has 2 red flags in that she has had a 19 pound weight loss over the last several months that is unintentional and her age being 65. This is currently being worked up and she has a Doppler ultrasound scheduled as an outpatient to assess for bowel ischemia and will also be set up for a colonoscopy to assure there is no intraluminal process. If, after an extensive workup is completed, and found to be negative her symptoms may be consistent with constipation predominant irritible bowel syndrome, but this is a diagnosis of exclusion that does require further evaluation given her red flags.  On admission she was given 2 doses of IV Lasix with nice diuresis and improvement in her blood pressure. Her diltiazem was discontinued and she was started on amlodipine. This morning she feels well with minimal abdominal pain and minimal  headache. Given the mild orthostasis that she had in clinic based on the diastolic blood pressure change we will have to tolerate a slightly higher systolic blood pressure than we normally may. Therefore the target of 0000000 systolic seems reasonable. She will be discharged on amlodipine and Lasix. The importance of taking the Lasix for volume control, and as blood pressure control was stressed. She was encouraged to remain compliant with the Lasix therapy. I agree with the housestaff's plan to discharge her home today with continued outpatient evaluation of her abdominal pain as already scheduled.

## 2013-08-27 NOTE — Progress Notes (Addendum)
INITIAL NUTRITION ASSESSMENT  DOCUMENTATION CODES Per approved criteria  -Obesity Unspecified   INTERVENTION: Encourage PO's at meals/snacks.  Encouraged pt to utilize PO supplements at home if weight loss continues.   NUTRITION DIAGNOSIS: Unintentional weight loss related to abdominal pain, nausea, vomiting as evidenced by 16 lb weight loss in the last month.   Goal: Pt to meet >/= 90% of their estimated nutrition needs   Monitor:  PO intake, weight trend, labs  Reason for Assessment: Pt identified as at nutrition risk on the Malnutrition Screen Tool  73 y.o. female  Admitting Dx: Hypertensive urgency  ASSESSMENT: Nancy [name] is a 73 y.o. woman with a past medical history of HTN, HLD, CKD stage 5, CVA, diverticulosis, and cocaine abuse who presents from IM clinic for hypertensive urgency.  Pt with long hx of non-compliance with any medications.  Pt has stopped going to nephrologist.  Pt with hx of chronic diffuse abdominal pain and constipation unrelated to PO intake.  Per pt she has had trouble with N/V and abd pain for the last month. Pt has had a good appetite but unable to keep food down and has lost weight due to this.  Pt reports 8% weight loss x 1 month. Unable to determine intake history and pt has consumed PO's but has not kept them down.  Due to obesity no fat or muscle wasting noted. Pt does have evidenced of a large weight loss in her arms and calves with extra skin present.   Height: Ht Readings from Last 1 Encounters:  08/26/13 5\' 3"  (1.6 m)    Weight: Wt Readings from Last 1 Encounters:  08/26/13 174 lb 8 oz (79.153 kg)    Ideal Body Weight: 52.2 kg   % Ideal Body Weight: 152%  Wt Readings from Last 10 Encounters:  08/26/13 174 lb 8 oz (79.153 kg)  08/26/13 177 lb 9.6 oz (80.559 kg)  08/25/13 181 lb (82.101 kg)  08/19/13 180 lb 9.6 oz (81.92 kg)  08/16/13 183 lb 11.2 oz (83.326 kg)  08/12/13 179 lb 1.6 oz (81.239 kg)  07/26/13 190 lb (86.183 kg)   07/13/13 199 lb (90.266 kg)  02/16/13 191 lb 11.2 oz (86.955 kg)  02/04/13 185 lb 3 oz (84 kg)    Usual Body Weight: 190 lb 1/15  % Usual Body Weight: 92%  BMI:  Body mass index is 30.92 kg/(m^2).  Estimated Nutritional Needs: Kcal: 1700-1900 Protein: 70-80 grams Fluid: > 1.5 L/day  Skin: no issues noted  Diet Order: General Meal Completion: 100%  EDUCATION NEEDS: -No education needs identified at this time   Intake/Output Summary (Last 24 hours) at 08/27/13 1045 Last data filed at 08/27/13 0606  Gross per 24 hour  Intake    680 ml  Output   1350 ml  Net   -670 ml    Last BM: 2/16   Labs:   Recent Labs Lab 08/22/13 1008 08/25/13 0930 08/27/13 0350  NA 142 141 142  K 4.5 3.8 3.7  CL 103 103 103  CO2 24 24 22   BUN 32* 29* 31*  CREATININE 4.41* 4.05* 4.28*  CALCIUM 9.3 9.3 8.7  GLUCOSE 112* 114* 100*    CBG (last 3)  No results found for this basename: GLUCAP,  in the last 72 hours  Scheduled Meds: . amLODipine  5 mg Oral Daily  . heparin  5,000 Units Subcutaneous 3 times per day  . latanoprost  1 drop Both Eyes BID    Continuous Infusions:  Past Medical History  Diagnosis Date  . CVA (cerebrovascular accident)     New hemorrhagic per CT scan '09  . Fecal impaction   . Diverticulosis of colon   . Hypertension   . Pulmonary nodule   . Dysfunctional uterine bleeding   . Postmenopausal   . OA (osteoarthritis)     bilateral knees  . Colitis   . Bacterial sinusitis 09/17/2011  . TINEA CRURIS 01/12/2007  . HERNIORRHAPHY, HX OF 08/11/2006  . CKD (chronic kidney disease) stage 4, GFR 15-29 ml/min 08/11/2006    Cr continues to increase. Proteinuria on UA 02/10/12.    Marland Kitchen [id number])     Past Surgical History  Procedure Laterality Date  . Inguinal hernia repair  2008  . Abdominal hysterectomy    . Cholecystectomy  2009  . Iridotomy / iridectomy      Laser, right eye 12/26/11 left eye 01/24/12  . Mass excision Left 05/07/2013    Procedure:  EXCISION CYST;  Surgeon: Myrtha Mantis., MD;  Location: [facility];  Service: Ophthalmology;  Laterality: Left;    Maylon Peppers RD, [place], [place] Pager [phone] After Hours Pager

## 2013-08-27 NOTE — Discharge Instructions (Signed)
1. You have a follow up appointment as scheduled:  Nancy [name]  On 09/02/2013 @ 3:30 PM  [address] [place] [address] [phone]  2. Please take all medications as prescribed. Take Lasix 40 mg daily + Norvasc 5 mg daily.  3. If you have worsening of your symptoms or new symptoms arise, please call the clinic [phone]), or go to the ER immediately if symptoms are severe.   Hypertension As your heart beats, it forces blood through your arteries. This force is your blood pressure. If the pressure is too high, it is called hypertension (HTN) or high blood pressure. HTN is dangerous because you may have it and not know it. High blood pressure may mean that your heart has to work harder to pump blood. Your arteries may be narrow or stiff. The extra work puts you at risk for heart disease, stroke, and other problems.  Blood pressure consists of two numbers, a higher number over a lower, 110/72, for example. It is stated as "110 over 72." The ideal is below 120 for the top number (systolic) and under 80 for the bottom (diastolic). Write down your blood pressure today. You should pay close attention to your blood pressure if you have certain conditions such as:  Heart failure.  Prior heart attack.  Diabetes  Chronic kidney disease.  Prior stroke.  Multiple risk factors for heart disease. To see if you have HTN, your blood pressure should be measured while you are seated with your arm held at the level of the heart. It should be measured at least twice. A one-time elevated blood pressure reading (especially in the Emergency Department) does not mean that you need treatment. There may be conditions in which the blood pressure is different between your right and left arms. It is important to see your caregiver soon for a recheck. Most people have essential hypertension which means that there is not a specific cause. This type of high blood pressure may be lowered by changing  lifestyle factors such as:  Stress.  Smoking.  Lack of exercise.  Excessive weight.  Drug/tobacco/alcohol use.  Eating less salt. Most people do not have symptoms from high blood pressure until it has caused damage to the body. Effective treatment can often prevent, delay or reduce that damage. TREATMENT  When a cause has been identified, treatment for high blood pressure is directed at the cause. There are a large number of medications to treat HTN. These fall into several categories, and your caregiver will help you select the medicines that are best for you. Medications may have side effects. You should review side effects with your caregiver. If your blood pressure stays high after you have made lifestyle changes or started on medicines,   Your medication(s) may need to be changed.  Other problems may need to be addressed.  Be certain you understand your prescriptions, and know how and when to take your medicine.  Be sure to follow up with your caregiver within the time frame advised (usually within two weeks) to have your blood pressure rechecked and to review your medications.  If you are taking more than one medicine to lower your blood pressure, make sure you know how and at what times they should be taken. Taking two medicines at the same time can result in blood pressure that is too low. SEEK IMMEDIATE MEDICAL CARE IF:  You develop a severe headache, blurred or changing vision, or confusion.  You have unusual weakness or numbness,  or a faint feeling.  You have severe chest or abdominal pain, vomiting, or breathing problems. MAKE SURE YOU:   Understand these instructions.  Will watch your condition.  Will get help right away if you are not doing well or get worse. Document Released: 06/24/2005 Document Revised: 09/16/2011 Document Reviewed: 02/12/2008 [facility] Patient Information 2014 [place].

## 2013-08-27 NOTE — Discharge Summary (Signed)
Name: Nancy E Strupp MRN: [id number] DOB: Sep 17, 1940 73 y.o. PCP: Sid Falcon, MD  Date of Admission: 08/26/2013 11:46 AM Date of Discharge: 08/27/2013 Attending Physician: Karren Cobble, MD  Discharge Diagnosis: 1. Uncontrolled Hypertension 2. Abdominal pain 3. CKD Stage V  Discharge Medications:   Medication List    STOP taking these medications       diltiazem 240 MG 24 hr capsule  Commonly known as:  CARTIA XT      TAKE these medications       acetaminophen 500 MG tablet  Commonly known as:  TYLENOL  Take 500 mg by mouth every 6 (six) hours as needed for mild pain, moderate pain, fever or headache.     amLODipine 5 MG tablet  Commonly known as:  NORVASC  Take 1 tablet (5 mg total) by mouth daily.     DSS 100 MG Caps  Take 100 mg by mouth daily.     famotidine 40 MG tablet  Commonly known as:  PEPCID  Take 40 mg by mouth daily.     furosemide 40 MG tablet  Commonly known as:  LASIX  Take 1 tablet (40 mg total) by mouth daily.     polyethylene glycol packet  Commonly known as:  MIRALAX / GLYCOLAX  Take 17 g by mouth daily.     Travoprost (BAK Free) 0.004 % Soln ophthalmic solution  Commonly known as:  TRAVATAN  Place 1-2 drops into both eyes 2 (two) times daily. 1 drops in am, 2 drop in pm        Disposition and follow-up:   Ms.Deloria E Mans was discharged from [facility] in Stable condition.  At the hospital follow up visit please address:  1.  Blood pressure. Patient switched from Dilt to Amlodipine 5 mg po qd + Lasix 40 mg po qd d/t issues w/ dizziness on Dilt. Patient w/ orthostatic HoTN on admission, likely 2/2 autonomic dysfunction. Is patient still dizzy? Still needs further workup for abdominal pain (underway as per Dr. Stann Mainland) in order to rule out chronic mesenteric ischemia. CT scan negative for acute findings or masses.   2.  Labs / imaging needed at time of follow-up: Abdominal US (already ordered as per PCP),  BMP  3.  Pending labs/ test needing follow-up: none  Follow-up Appointments:     Follow-up Information   Follow up with Ivin Poot, MD On 09/02/2013. (3:30)    Specialty:  Internal Medicine   Contact information:   [place] [address] [phone]      Discharge Instructions: Discharge Orders   Future Appointments Provider Department Dept Phone   08/31/2013 9:00 AM [facility] [place] [facility] [phone]   09/02/2013 3:30 PM Ivin Poot, MD [place] [phone]   Future Orders Complete By Expires   Call MD for:  difficulty breathing, headache or visual disturbances  As directed    Call MD for:  persistant dizziness or light-headedness  As directed    Call MD for:  persistant nausea and vomiting  As directed    Increase activity slowly  As directed      Consultations:  none  Procedures Performed:  Ct Abdomen Pelvis Wo Contrast  08/25/2013   CLINICAL DATA:  Abdominal pain.  Renal insufficiency  EXAM: CT ABDOMEN AND PELVIS WITHOUT CONTRAST  TECHNIQUE: Multidetector CT imaging of the abdomen and pelvis was performed following the standard protocol without intravenous contrast.  COMPARISON:  07/26/2013  FINDINGS: BODY WALL: Unremarkable.  LOWER CHEST: Aortic valvular calcification. Small pericardial effusion, stable from prior. There pulmonary nodules in the bilateral lower lungs, at least 2 in the right lower lobe measuring up to 5 mm, and 1 in the left lower lobe measuring 5 mm. These are stable from recent imaging: the right-sided lower lobe nodules are unchanged from 2010. The left lower lobe nodules stable since 07/13/2012.  ABDOMEN/PELVIS:  Liver: No focal abnormality.  Biliary: Cholecystectomy.  Pancreas: Unremarkable.  Spleen: Unremarkable.  Adrenals: Unremarkable.  Kidneys and ureters: Symmetrically atrophic bilateral kidneys with lobulation. No hydronephrosis.  Bladder: Unremarkable.   Reproductive: There is a 4 cm mass exophytic from the left uterine fundus, stable from priors and consistent with fibroid.  Bowel: No obstruction. Normal appendix. Distal colonic diverticulosis.  Retroperitoneum: No mass or adenopathy.  Peritoneum: No free fluid or gas.  Vascular: No acute abnormality.  OSSEOUS: No acute abnormalities.  IMPRESSION: 1. No acute intra-abdominal abnormality. 2. Chronic findings are stable from prior and described above.   Electronically Signed   By: Jorje Guild M.D.   On: 08/25/2013 12:32   Admission HPI:  [name] is a 73 y.o. female w/ PMHx of CVA, CKD stage V, HTN, HLD, and Diverticulosis, presents from clinic w/ hypertensive urgency and abdominal pain. Today, she presented to the clinic w/ BP of 197/91 and reports good compliance w/ her Cardizem (started taking in 02/2013 2/2 cocaine abuse) at home, however, she states that her medication makes her feel dizzy and weak. The patient was also seen in the ED yesterday for abdominal pain and her BP was also elevated to 198/90. In the clinic, she also had orthostatic vital signs. The patient denies any recent changes in her vision, no nausea, vomiting, LOC, chest pain, SOB, or palpitations.  The patient also complains of abdominal pain, which seems to be an ongoing issue for her since January. She says the pain is relatively diffuse in nature, not associated w/ food. She says she has not had any recent diarrhea, melena, or hematochezia, but does admit to chronic constipation, saying that her last bowel movement was 3 days ago, which is normal for her. She says she has nausea and vomiting at times, and claims she vomited yesterday, which was bilious in nature. She says her appetite has been okay lately, but has lost 19 lbs in the last 1-2 months. A CT abdomen pelvis was performed in the ED on 08/25/13, which showed no acute abnormalities, only chronic changes (atrophic kidneys, uterine fibroid, distal colonic  diverticulosis).  Hospital Course by problem list:   1. Uncontrolled Blood Pressure- Ms. Mcrorie presented from Internal Medicine clinic with blood pressures up to 0000000 systolic. Patient is CKD stage V with Cr near baseline at 4.05. She said she was compliant with her home diltiazem but reported feeling dizzy and weak particularly when taking the medication. Of note the patient has had a long history of non-compliance in the past with several medications previously discontinued (including Lisinopril, amlodipine, atourvastatin) due to non-compliance. She also stopped taking her home furosemide last year as it was prescribed by her nephrologist who she stopped seeing. Ms. Provenzano reported regular mild headaches but there was no overt evidence of end-organ damage. On our initial exam she had mild bibasilar crackles but no peripheral edema or other signs of volume overload. Her orthostatic vital signs were positive in clinic (BP 192/99 lying --> 146/86, HR 95 standing) and remained positive when  repeated after hospitalization (113/98 lying --> 144/61 standing). This is likely due to autonomic dysfunction, perhaps in conjunction with medication induced hypotension from her diltiazem. Given the patient's ESRD and uncontrolled blood pressure in the setting of not taking her furosemide, we gave her 2 doses of 40 mg IV Lasix during her admission. We discontinued her diltiazem and started 5 mg amlodipine. She diuresed appropriately with her blood pressure stabilizing in the [id number] systolic. Her creatinine was stable at 4.28. She was discharged with 40 mg po Lasix and 5 mg amlodipine.   2. Abdominal pain- Patient complained of chronic diffuse abdominal pain along with ~20 lb weight loss in the last 5 weeks. She has had several ED visits since early January for this complaint. Abdominal pain is not associated w/ food and she reported good appetite. She has had some nausea and vomiting and has been chronically  constipated with her last bowel movement on 08/23/13. She has no history of diarrhea, hematochezia, melena, leukocytosis, and had a normal lactic acid n 08/22/13. ACS has been ruled out in the ED and a recent CT abdomen pelvis during an ED visit showed no acute abnormality. This is being worked up by patient's PCP with mesenteric [place] scheduled for 08/31/13 to rule out mesenteric ischemia. We gave her one dose of Carafate and then gave colace and miralax the morning of discharge. Outpatient workup including mesenteric [place] and colonoscopy per PCP.   3. CKD stage V- The patient has followed w/ Dr. Mercy Moore in the past, however, stopped seeing him for personal reasons. She still makes urine and her BMP showed no significant electrolyte abnormalities. On admission her Cr was elevated at 4.05, but this is at her baseline (~4.0). A UA from 08/25/13 showed >300 protein, trace Hb, hyaline casts, many squamous epithelial cells, and few bacteria. CT abdomen pelvis from 2/18 showed symmetrically atrophic bilateral kidneys with lobulation and no hydronephrosis. Her creatinine bumped up slightly to 4.28 with diuresis during admission but electrolytes remained stable. Her PCP has placed a referral for [facility] for future HD  Discharge Vitals:   BP 163/84  Pulse 104  Temp(Src) 98.3 F (36.8 C) (Oral)  Resp 18  Ht 5\' 3"  (1.6 m)  Wt 174 lb 8 oz (79.153 kg)  BMI 30.92 kg/m2  SpO2 97%  Discharge Labs:  Results for orders placed during the hospital encounter of 08/26/13 (from the past 24 hour(s))  URINE RAPID DRUG SCREEN (HOSP PERFORMED)     Status: Abnormal   Collection Time    08/26/13  6:32 PM      Result Value Ref Range   Opiates POSITIVE (*) NONE DETECTED   Cocaine NONE DETECTED  NONE DETECTED   Benzodiazepines NONE DETECTED  NONE DETECTED   Amphetamines NONE DETECTED  NONE DETECTED   Tetrahydrocannabinol NONE DETECTED  NONE DETECTED   Barbiturates NONE DETECTED  NONE DETECTED  COMPREHENSIVE METABOLIC  PANEL     Status: Abnormal   Collection Time    08/27/13  3:50 AM      Result Value Ref Range   Sodium 142  137 - 147 mEq/L   Potassium 3.7  3.7 - 5.3 mEq/L   Chloride 103  96 - 112 mEq/L   CO2 22  19 - 32 mEq/L   Glucose, Bld 100 (*) 70 - 99 mg/dL   BUN 31 (*) 6 - 23 mg/dL   Creatinine, Ser 4.28 (*) 0.50 - 1.10 mg/dL   Calcium 8.7  8.4 - 10.5 mg/dL  Total Protein 6.4  6.0 - 8.3 g/dL   Albumin 3.1 (*) 3.5 - 5.2 g/dL   AST 12  0 - 37 U/L   ALT 11  0 - 35 U/L   Alkaline Phosphatase 61  39 - 117 U/L   Total Bilirubin 0.3  0.3 - 1.2 mg/dL   GFR calc non Af Amer 10 (*) >90 mL/min   GFR calc Af Amer 11 (*) >90 mL/min    Signed: Corky Sox, MD 08/27/2013, 11:23 AM   Time Spent on Discharge: 35 minutes Services Ordered on Discharge: none Equipment Ordered on Discharge: none

## 2013-08-27 NOTE — Progress Notes (Signed)
  I have seen and examined the patient myself, and I have reviewed the note by Rushina Cholera, MS 3 and was present during the interview and physical exam.  Please see my separate H&P for additional findings, assessment, and plan.   Signed: Corky Sox, MD 08/27/2013, 11:22 AM

## 2013-08-27 NOTE — H&P (Signed)
  I have seen and examined the patient myself, and I have reviewed the note by Rushina Cholera, MS 3 and was present during the interview and physical exam.  Please see my separate H&P for additional findings, assessment, and plan.   Signed: Corky Sox, MD 08/27/2013, 6:32 AM

## 2013-08-31 ENCOUNTER — Ambulatory Visit (HOSPITAL_COMMUNITY): Payer: PRIVATE HEALTH INSURANCE

## 2013-09-02 ENCOUNTER — Ambulatory Visit (HOSPITAL_COMMUNITY): Payer: PRIVATE HEALTH INSURANCE

## 2013-09-02 ENCOUNTER — Ambulatory Visit: Payer: PRIVATE HEALTH INSURANCE | Admitting: Internal Medicine

## 2013-09-06 ENCOUNTER — Other Ambulatory Visit: Payer: Self-pay | Admitting: Internal Medicine

## 2013-09-06 DIAGNOSIS — K551 Chronic vascular disorders of intestine: Secondary | ICD-10-CM

## 2013-09-07 ENCOUNTER — Ambulatory Visit: Payer: PRIVATE HEALTH INSURANCE | Admitting: Internal Medicine

## 2013-09-07 ENCOUNTER — Other Ambulatory Visit (HOSPITAL_COMMUNITY): Payer: Self-pay | Admitting: Internal Medicine

## 2013-09-07 DIAGNOSIS — K551 Chronic vascular disorders of intestine: Secondary | ICD-10-CM

## 2013-09-08 ENCOUNTER — Ambulatory Visit (HOSPITAL_COMMUNITY)
Admission: RE | Admit: 2013-09-08 | Discharge: 2013-09-08 | Disposition: A | Discharge status: Home / Self Care | Payer: PRIVATE HEALTH INSURANCE | Source: Ambulatory Visit | Attending: Internal Medicine | Admitting: Internal Medicine

## 2013-09-08 ENCOUNTER — Ambulatory Visit (INDEPENDENT_AMBULATORY_CARE_PROVIDER_SITE_OTHER): Payer: PRIVATE HEALTH INSURANCE | Admitting: Internal Medicine

## 2013-09-08 ENCOUNTER — Encounter: Payer: Self-pay | Admitting: Internal Medicine

## 2013-09-08 ENCOUNTER — Ambulatory Visit (HOSPITAL_COMMUNITY): Admission: RE | Admit: 2013-09-08 | Payer: PRIVATE HEALTH INSURANCE | Source: Ambulatory Visit

## 2013-09-08 VITALS — BP 155/82 | HR 93 | Temp 98.2°F | Ht 63.0 in | Wt 169.8 lb

## 2013-09-08 DIAGNOSIS — K551 Chronic vascular disorders of intestine: Secondary | ICD-10-CM

## 2013-09-08 DIAGNOSIS — I160 Hypertensive urgency: Secondary | ICD-10-CM

## 2013-09-08 DIAGNOSIS — I10 Essential (primary) hypertension: Secondary | ICD-10-CM | POA: Insufficient documentation

## 2013-09-08 DIAGNOSIS — K219 Gastro-esophageal reflux disease without esophagitis: Secondary | ICD-10-CM

## 2013-09-08 DIAGNOSIS — Z0000 Encounter for general adult medical examination without abnormal findings: Secondary | ICD-10-CM | POA: Insufficient documentation

## 2013-09-08 DIAGNOSIS — N185 Chronic kidney disease, stage 5: Secondary | ICD-10-CM

## 2013-09-08 DIAGNOSIS — I129 Hypertensive chronic kidney disease with stage 1 through stage 4 chronic kidney disease, or unspecified chronic kidney disease: Secondary | ICD-10-CM

## 2013-09-08 DIAGNOSIS — R109 Unspecified abdominal pain: Secondary | ICD-10-CM

## 2013-09-08 LAB — BASIC METABOLIC PANEL
BUN: 50 mg/dL — ABNORMAL HIGH (ref 6–23)
CO2: 26 mEq/L (ref 19–32)
CREATININE: 5.47 mg/dL — AB (ref 0.50–1.10)
Calcium: 9.3 mg/dL (ref 8.4–10.5)
Chloride: 108 mEq/L (ref 96–112)
Glucose, Bld: 96 mg/dL (ref 70–99)
POTASSIUM: 3.8 meq/L (ref 3.5–5.3)
Sodium: 145 mEq/L (ref 135–145)

## 2013-09-08 NOTE — Assessment & Plan Note (Signed)
Awaiting appt with Renal. Baseline creatinine 4. Check Bmet today.

## 2013-09-08 NOTE — Assessment & Plan Note (Addendum)
BP Readings from Last 3 Encounters:  09/08/13 155/82  08/27/13 163/84  08/26/13 181/93    Lab Results  Component Value Date   NA 142 08/27/2013   K 3.7 08/27/2013   CREATININE 4.28* 08/27/2013    Assessment: Blood pressure control: controlled Progress toward BP goal:  at goal Comments: no further dizziness or abdominal pain since stopping diltiazem  Plan: Medications:  continue current medications Educational resources provided:   Self management tools provided: home blood pressure logbook Other plans: cont amlodipine 5 mg qd, lasix 40 mg qd, referral to Renal pending, BMET today

## 2013-09-08 NOTE — Assessment & Plan Note (Signed)
Reports compliance with famotidine. No reflux complaints today.

## 2013-09-08 NOTE — Assessment & Plan Note (Signed)
Currently resolved, ABd U/s pending today.

## 2013-09-08 NOTE — Progress Notes (Signed)
   Subjective:    Patient ID: Nancy [name], female    DOB: 06/11/1941, 73 y.o.   MRN: [id number]  HPI   Pt presents for post-hospital follow-up of uncontrolled hypertension, abdominal pain, and chronic kidney disease stage V. Her hx is significant for cocaine abuse and medical non-compliance.  During admission diltiazem was discontinued in setting of orthostatic hypotension and outpt abdominal ultrasound was ordered for assessment of possible mesenteric ischemia.  Today pt reports compliance with famotidine, amlodipine and lasix initiated after discontinuation of diltiazem, and states that she has had no further dizziness or abdominal pain.  In fact she reports normal bowel movement daily as well. Urine drug screen negative for cocaine for past 7 months (pt reports not using for over a year). She has been referred back to Renal for CKD and is awaiting an appt, she is do for abd u/s today, and is overdue for mammogram and colonoscopy.  Review of Systems  Constitutional: Negative.   HENT: Negative.   Eyes: Negative.   Respiratory: Negative.   Cardiovascular: Negative for chest pain.  Gastrointestinal: Negative for nausea, abdominal pain, constipation, blood in stool and abdominal distention.  Endocrine: Negative.   Genitourinary: Negative for dysuria.  Musculoskeletal: Negative.   Skin: Negative.   Neurological: Negative for dizziness.  Psychiatric/Behavioral: Negative.        Objective:   Physical Exam  Constitutional: She is oriented to person, place, and time. She appears well-developed and well-nourished. No distress.  HENT:  Head: Normocephalic and atraumatic.  Eyes: Conjunctivae and EOM are normal. Pupils are equal, round, and reactive to light.  Cardiovascular: Normal rate, regular rhythm and normal heart sounds.   Pulmonary/Chest: Effort normal and breath sounds normal.  Abdominal: Soft. Bowel sounds are normal. She exhibits no distension. There is no tenderness.    Musculoskeletal: She exhibits no edema.  Neurological: She is alert and oriented to person, place, and time.  Skin: Skin is warm and dry.  Psychiatric: She has a normal mood and affect.          Assessment & Plan:  See detailed problem list charting:

## 2013-09-08 NOTE — Progress Notes (Signed)
*  PRELIMINARY RESULTS* Vascular Ultrasound Renal Artery Duplex has been completed.  There is no obvious evidence of hemodynamically significant renal artery stenosis bilaterally.  09/08/2013 11:46 AM Maudry Mayhew, RVT, RDCS, RDMS

## 2013-09-08 NOTE — Patient Instructions (Addendum)
General Instructions:  You blood pressure is doing much better. Continue to take the medications as instructed. Follow-up with the Kidney doctors when you get an appointment. Get the ultrasound of your abdomin today. We will check blood work and contact you if needed.   Treatment Goals:  Goals (1 Years of Data) as of 09/08/13         As of Today 08/27/13 08/27/13 08/26/13 08/26/13     Blood Pressure    . Blood Pressure < 160/90  155/82 163/84 154/73 175/70 138/64     Result Component    . LDL CALC < 100            Progress Toward Treatment Goals:  Treatment Goal 09/08/2013  Blood pressure at goal    Self Care Goals & Plans:  Self Care Goal 08/26/2013  Manage my medications take my medicines as prescribed; bring my medications to every visit; refill my medications on time  Eat healthy foods eat foods that are low in salt; eat baked foods instead of fried foods  Be physically active -    No flowsheet data found.   Care Management & Community Referrals:  Referral 09/08/2013  Referrals made for care management support none needed

## 2013-09-08 NOTE — Assessment & Plan Note (Signed)
Release order for mammogram Refer for colonoscopy, pt states that she does not want to go where she went previously which was not close to the hospital. Pt reports that she received Zostavax 5 years ago after her husband had the shingles (via [place]).

## 2013-09-09 ENCOUNTER — Other Ambulatory Visit: Payer: Self-pay | Admitting: Internal Medicine

## 2013-09-09 NOTE — Progress Notes (Signed)
Case discussed with Dr. Schooler at the time of the visit.  We reviewed the resident's history and exam and pertinent patient test results.  I agree with the assessment, diagnosis, and plan of care documented in the resident's note.     

## 2013-09-16 ENCOUNTER — Telehealth: Payer: Self-pay | Admitting: *Deleted

## 2013-09-16 NOTE — Telephone Encounter (Signed)
Call from [place] at [facility] - stated pt is ready to proceed w/dialysis and where we were w/the renal referral. Left message, pt is an established pt w/Dr Mercy Moore  And need to call his office and to proceed from there; she will need a shunt, etc. And to call [facility] if we can do anything else.

## 2013-09-17 NOTE — Telephone Encounter (Signed)
Thanks!  I thought she was already established.  Does she have an appointment with them to discuss?  Is there anything else I need to do?   Thanks again

## 2013-09-20 ENCOUNTER — Telehealth: Payer: Self-pay | Admitting: *Deleted

## 2013-09-20 NOTE — Telephone Encounter (Signed)
Call from pt - pt states she went for colonoscopy at Dr [facility] office but he could not do it d/t elevated BP. She was told she needed to see her doctor. States her daughter had already called and scheduled an appt for Wednesday. And colonoscopy has been re-scheduled for next month.

## 2013-09-21 ENCOUNTER — Ambulatory Visit: Payer: PRIVATE HEALTH INSURANCE | Admitting: Internal Medicine

## 2013-09-22 ENCOUNTER — Ambulatory Visit (INDEPENDENT_AMBULATORY_CARE_PROVIDER_SITE_OTHER): Payer: PRIVATE HEALTH INSURANCE | Admitting: Internal Medicine

## 2013-09-22 ENCOUNTER — Encounter: Payer: Self-pay | Admitting: Internal Medicine

## 2013-09-22 VITALS — BP 159/86 | HR 90 | Temp 97.2°F | Ht 62.0 in | Wt 179.0 lb

## 2013-09-22 DIAGNOSIS — I10 Essential (primary) hypertension: Secondary | ICD-10-CM

## 2013-09-22 MED ORDER — AMLODIPINE BESYLATE 10 MG PO TABS: 10.0000 mg | ORAL_TABLET | Freq: Every day | ORAL | Status: DC

## 2013-09-22 NOTE — Patient Instructions (Signed)
General Instructions:  We will increase your amlodipine (Norvasc) medication to 10 mg everyday.  You may take 2 of the 5 mg pills that you already have until the bottle is empty. When you get the refill, the pills will be the 10 mg pills so then you only take 1 pill a day again. Keep your next appointment with Dr. Benson Norway on Monday April 13th at 1:30.  Thank you for bringing your medicines today. This helps [place] keep you safe from mistakes.   Treatment Goals:  Goals (1 Years of Data) as of 09/22/13         As of Today 09/08/13 08/27/13 08/27/13 08/26/13     Blood Pressure    . Blood Pressure < 160/90  159/86 155/82 163/84 154/73 175/70     Result Component    . LDL CALC < 100            Progress Toward Treatment Goals:  Treatment Goal 09/22/2013  Blood pressure at goal    Self Care Goals & Plans:  Self Care Goal 09/22/2013  Manage my medications take my medicines as prescribed; bring my medications to every visit; refill my medications on time; follow the sick day instructions if I am sick  Monitor my health keep track of my blood pressure; keep track of my weight  Eat healthy foods eat more vegetables; eat fruit for snacks and desserts; eat baked foods instead of fried foods; eat smaller portions; eat foods that are low in salt; drink diet soda or water instead of juice or soda  Be physically active find an activity I enjoy    No flowsheet data found.   Care Management & Community Referrals:  Referral 09/08/2013  Referrals made for care management support none needed

## 2013-09-22 NOTE — Assessment & Plan Note (Signed)
BP Readings from Last 3 Encounters:  09/22/13 159/86  09/08/13 155/82  08/27/13 163/84    Lab Results  Component Value Date   NA 145 09/08/2013   K 3.8 09/08/2013   CREATININE 5.47* 09/08/2013    Assessment: Blood pressure control: controlled Progress toward BP goal:  at goal Comments: reports compliance with regimen, bp at GI office 160/100, colonoscopy not scheduled due to concern for inability to tolerate pre and or procedure secondary to bp  Plan: Medications:  continue current medications Educational resources provided: brochure;handout;video Self management tools provided:   Other plans: will increase amlodipine to 10 mg qd and continue Lasix 40 mg qd

## 2013-09-22 NOTE — Progress Notes (Signed)
   Subjective:    Patient ID: Nancy [name], female    DOB: [birth year], 73 y.o.   MRN: [id number]  HPI  Pt presents to clinic on recommendation of Dr. Benson Norway of GI who had consultation with the pt for colonoscopy.  Her bp was 160/100 pulse 60 bpm at that visit and there was concern per Dr. Benson Norway that her procedure would be cancelled secondary to her hypertension.    Today bp 159/86 with pulse 90 bpm which is consistent with most recent prior measurements in the office. Her regimen consists of calcium channel blocker and loop diuretic.  Review of Systems  Constitutional: Negative.   HENT: Negative.   Eyes: Negative.   Respiratory: Negative.   Cardiovascular: Negative.   Gastrointestinal: Negative.   Genitourinary: Negative.   Musculoskeletal: Negative.   Skin: Negative.   Neurological: Negative.   Hematological: Does not bruise/bleed easily.  Psychiatric/Behavioral: Negative.        Objective:   Physical Exam  Constitutional: She is oriented to person, place, and time. She appears well-developed and well-nourished. No distress.  HENT:  Head: Normocephalic and atraumatic.  Eyes: Conjunctivae and EOM are normal. Pupils are equal, round, and reactive to light.  Cardiovascular: Normal rate.   Pulmonary/Chest: Effort normal.  Neurological: She is alert and oriented to person, place, and time.  Skin: No rash noted. No erythema.  Psychiatric: She has a normal mood and affect. Her behavior is normal. Judgment and thought content normal.          Assessment & Plan:  See problem-list charting for full details:  1. Hypertension: at goal of <160/90 but does have some room for lower bp given mildly elevated episodes at outside clinic - will increase amlodipine to 10 mg qd and cont Lasix 40 mg qd

## 2013-09-27 ENCOUNTER — Ambulatory Visit (HOSPITAL_COMMUNITY): Admission: RE | Admit: 2013-09-27 | Payer: PRIVATE HEALTH INSURANCE | Source: Ambulatory Visit

## 2013-09-29 NOTE — Progress Notes (Signed)
Case discussed with Dr. Schooler soon after the resident saw the patient.  We reviewed the resident's history and exam and pertinent patient test results.  I agree with the assessment, diagnosis, and plan of care documented in the resident's note. 

## 2013-09-30 ENCOUNTER — Ambulatory Visit (HOSPITAL_COMMUNITY)
Admission: RE | Admit: 2013-09-30 | Discharge: 2013-09-30 | Disposition: A | Discharge status: Home / Self Care | Payer: PRIVATE HEALTH INSURANCE | Source: Ambulatory Visit | Attending: Internal Medicine | Admitting: Internal Medicine

## 2013-09-30 DIAGNOSIS — Z1231 Encounter for screening mammogram for malignant neoplasm of breast: Secondary | ICD-10-CM | POA: Insufficient documentation

## 2013-10-12 ENCOUNTER — Other Ambulatory Visit: Payer: Self-pay | Admitting: *Deleted

## 2013-10-12 DIAGNOSIS — Z01810 Encounter for preprocedural cardiovascular examination: Secondary | ICD-10-CM

## 2013-10-12 DIAGNOSIS — N184 Chronic kidney disease, stage 4 (severe): Secondary | ICD-10-CM

## 2013-10-27 ENCOUNTER — Encounter: Payer: Self-pay | Admitting: Vascular Surgery

## 2013-10-28 ENCOUNTER — Ambulatory Visit: Payer: PRIVATE HEALTH INSURANCE | Admitting: Vascular Surgery

## 2013-10-28 ENCOUNTER — Encounter (HOSPITAL_COMMUNITY): Payer: PRIVATE HEALTH INSURANCE

## 2013-10-28 ENCOUNTER — Other Ambulatory Visit (HOSPITAL_COMMUNITY): Payer: PRIVATE HEALTH INSURANCE

## 2013-11-21 ENCOUNTER — Other Ambulatory Visit: Payer: Self-pay | Admitting: Internal Medicine

## 2013-12-04 ENCOUNTER — Telehealth: Payer: Self-pay | Admitting: Internal Medicine

## 2013-12-04 NOTE — Telephone Encounter (Signed)
Having issues with BP. Pt has  Norvasc 5 mg and 10 mg bottles and has instructions to take 10 mg bid.  Advised not to take 10 mg bid.  It is instructed to take Norvasc 10 mg daily.  BP was 111/48 then 104/42.  Will try to pt clinic appt next week to follow up BP   Nancy Dubin MD

## 2013-12-04 NOTE — Telephone Encounter (Signed)
Advised grandaughter to check BP start taking 5 mg tablet and BP elevated >140/09 after taking 5 mg advised her she could increase to 10 mg daily. Will have clinic call for appt next week   Aundra Dubin MD

## 2013-12-14 ENCOUNTER — Ambulatory Visit (INDEPENDENT_AMBULATORY_CARE_PROVIDER_SITE_OTHER): Payer: PRIVATE HEALTH INSURANCE | Admitting: Internal Medicine

## 2013-12-14 ENCOUNTER — Encounter: Payer: Self-pay | Admitting: Internal Medicine

## 2013-12-14 VITALS — BP 153/81 | HR 81 | Temp 96.6°F | Ht 62.0 in | Wt 182.1 lb

## 2013-12-14 DIAGNOSIS — I10 Essential (primary) hypertension: Secondary | ICD-10-CM

## 2013-12-14 DIAGNOSIS — N185 Chronic kidney disease, stage 5: Secondary | ICD-10-CM

## 2013-12-14 DIAGNOSIS — M1710 Unilateral primary osteoarthritis, unspecified knee: Secondary | ICD-10-CM

## 2013-12-14 DIAGNOSIS — I120 Hypertensive chronic kidney disease with stage 5 chronic kidney disease or end stage renal disease: Secondary | ICD-10-CM

## 2013-12-14 DIAGNOSIS — IMO0002 Reserved for concepts with insufficient information to code with codable children: Secondary | ICD-10-CM

## 2013-12-14 LAB — BASIC METABOLIC PANEL WITH GFR
BUN: 64 mg/dL — ABNORMAL HIGH (ref 6–23)
CO2: 23 mEq/L (ref 19–32)
Calcium: 9.2 mg/dL (ref 8.4–10.5)
Chloride: 107 mEq/L (ref 96–112)
Creat: 4.84 mg/dL — ABNORMAL HIGH (ref 0.50–1.10)
GFR, EST NON AFRICAN AMERICAN: 8 mL/min — AB
GFR, Est African American: 10 mL/min — ABNORMAL LOW
GLUCOSE: 94 mg/dL (ref 70–99)
POTASSIUM: 4.7 meq/L (ref 3.5–5.3)
Sodium: 139 mEq/L (ref 135–145)

## 2013-12-14 MED ORDER — AMLODIPINE BESYLATE 10 MG PO TABS: 10.0000 mg | ORAL_TABLET | Freq: Every day | ORAL | Status: DC

## 2013-12-14 NOTE — Assessment & Plan Note (Addendum)
BP Readings from Last 3 Encounters:  12/14/13 153/81  09/22/13 159/86  09/08/13 155/82    Lab Results  Component Value Date   NA 145 09/08/2013   K 3.8 09/08/2013   CREATININE 5.47* 09/08/2013    Assessment: Blood pressure control: controlled Progress toward BP goal:  at goal Comments: Patient seems to be very confused about the appropriate dose of her medications. She is supposed to be taking Amlodipine 10 mg po qd + Lasix 40 mg po qd. She continued to say different things, saying she was told only to take 5 mg, but then later claims she takes 5 in the AM and 10 in the PM. She then said she only takes 10, then only 5.   Plan: Medications:  continue current medications as specified in previous notes; Patient is to take Amlodipine 10 mg po qd + Lasix 40 mg po qd. This was made very clear for the patient during her clinic visit. We threw away the 5 mg tablets during her visit to avoid further confusion.  Educational resources provided: brochure Self management tools provided: home blood pressure logbook Other plans: Continue meds as specified above. Will return to clinic in 2 weeks for recheck.  -Repeat BMP today

## 2013-12-14 NOTE — Assessment & Plan Note (Addendum)
Patient has CKD stage V, most recent Cr 5.75. Has followed w/ [facility] in the past, but patient has yet to follow up recently. She will likely need HD in the very near future and should be evaluated for AVF, etc. Patient states she is making urine and is having no issues at this time. Feels quite well today.  -Will follow up on further renal evaluation. Also told patient to call and schedule a follow up appointment.  -Repeat BMP today shows somewhat improved Cr to 4.84, w/ no electrolyte abnormalities.

## 2013-12-14 NOTE — Progress Notes (Signed)
Subjective:   Patient ID: Nancy [name] female   DOB: [birth year] 73 y.o.   MRN: [id number]  HPI: Nancy [name] is a 73 y.o. female w/ PMHx of HTN, HLD, CKD stage V, h/o CVA, and GERD, presents to the clinic today for a follow-up visit. Patient was most recently seen in the clinic on 09/22/13 for BP control, at which time she had her Amlodipine increased to 10 mg qd. Since that time, there has been some confusion w/ her BP meds. She went to a colonoscopy appointment at Dr. Ulyses Amor office and was found to have BP of 160/100 at that time and the procedure was not performed 2/2 this. Her Norvasc was increased to 10 mg po qd but also seemed to have issues w/ LOW BP according to a phone note from earlier in the month.  Today, patient still continues to have significant confusion about her BP medications. She is very unclear about what she is supposed to be taking and provided different answers when asked multiple times. The patient said she takes Amlodipine 5 mg daily, then 10 mg daily, then 5 in the AM and 10 in the PM, all at different times during the visit. When asked if she takes her Lasix, she seemed to be relatively unclear about this as well, but it does seem that she is consistently taking her Lasix to help w/ her "leg swellin'".  Otherwise, the patient says she is doing quite well and has no significant complaints. She says she has some mild to moderate right knee pain, but says this is well controlled w/ Tylenol. Patient also has a h/o CKD stage V, and has not recently follow up w/ [facility]. Nancy [name] is previously established w/ Dr. Mercy Moore.   Past Medical History  Diagnosis Date  . CVA (cerebrovascular accident)     New hemorrhagic per CT scan '09  . Fecal impaction   . Diverticulosis of colon   . Hypertension   . Pulmonary nodule   . Dysfunctional uterine bleeding   . Postmenopausal   . OA (osteoarthritis)     bilateral knees  . Colitis   . Bacterial sinusitis 09/17/2011   . TINEA CRURIS 01/12/2007  . HERNIORRHAPHY, HX OF 08/11/2006  . CKD (chronic kidney disease) stage 4, GFR 15-29 ml/min 08/11/2006    Cr continues to increase. Proteinuria on UA 02/10/12.    Marland Kitchen [id number])    Current Outpatient Prescriptions  Medication Sig Dispense Refill  . acetaminophen (TYLENOL) 500 MG tablet Take 500 mg by mouth every 6 (six) hours as needed for mild pain, moderate pain, fever or headache.      Marland Kitchen amLODipine (NORVASC) 10 MG tablet Take 1 tablet (10 mg total) by mouth daily.  30 tablet  2  . docusate sodium 100 MG CAPS Take 100 mg by mouth daily.  10 capsule  0  . famotidine (PEPCID) 40 MG tablet Take 40 mg by mouth daily.      . furosemide (LASIX) 40 MG tablet take 1 tablet by mouth once daily  30 tablet  2  . polyethylene glycol (MIRALAX / GLYCOLAX) packet Take 17 g by mouth daily.  14 each  0  . Travoprost, BAK Free, (TRAVATAN) 0.004 % SOLN ophthalmic solution Place 1-2 drops into both eyes 2 (two) times daily. 1 drops in am, 2 drop in pm       No current facility-administered medications for this visit.   Family History  Problem Relation Age of  Onset  . Hypertension Mother   . Heart attack Mother   . Heart disease Mother    History   Social History  . Marital Status: Married    Spouse Name: N/A    Number of Children: N/A  . Years of Education: N/A   Social History Main Topics  . Smoking status: Never Smoker   . Smokeless tobacco: Never Used  . Alcohol Use: No  . Drug Use: No     Comment: 08/15/08 UDS + cocaine  . Sexual Activity: None   Other Topics Concern  . None   Social History Narrative   Married, lives with her husband. 1 child.           Review of Systems: General: Denies fever, chills, diaphoresis, appetite change and fatigue.  Respiratory: Denies SOB, DOE, cough, chest tightness, and wheezing.   Cardiovascular: Denies chest pain and palpitations.  Gastrointestinal: Denies nausea, vomiting, abdominal pain, diarrhea, constipation, blood in  stool and abdominal distention.  Genitourinary: Denies dysuria, urgency, frequency, hematuria, and flank pain. Endocrine: Denies hot or cold intolerance, polyuria, and polydipsia. Musculoskeletal: Positive for right knee pain. Denies myalgias, back pain, joint swelling, and gait problem.  Skin: Denies pallor, rash and wounds.  Neurological: Denies dizziness, seizures, syncope, weakness, lightheadedness, numbness and headaches.  Psychiatric/Behavioral: Denies mood changes, confusion, nervousness, sleep disturbance and agitation.  Objective:   Physical Exam: Filed Vitals:   12/14/13 1036  BP: 153/81  Pulse: 81  Temp: 96.6 F (35.9 C)  TempSrc: Oral  Height: 5\' 2"  (1.575 m)  Weight: 182 lb 1.6 oz (82.6 kg)  SpO2: 99%   General: Vital signs reviewed.  Patient is an elderly female, in no acute distress and cooperative with exam.  Head: Normocephalic and atraumatic. Eyes: PERRL, EOMI, conjunctivae normal, No scleral icterus.  Neck: Supple, trachea midline, normal ROM, No JVD, masses, thyromegaly, or carotid bruit present.  Cardiovascular: RRR, S1 normal, S2 normal, no murmurs, gallops, or rubs. Pulmonary/Chest: Air entry equal bilaterally, no wheezes, rales, or rhonchi. Abdominal: Soft, non-tender, non-distended, BS +, no masses, organomegaly, or guarding present.  Musculoskeletal: No joint deformities, erythema, or stiffness, ROM full and nontender. Extremities: Trace LE pitting edema, pulses symmetric and intact bilaterally. No cyanosis or clubbing. Neurological: A&O x3, Strength is normal and symmetric bilaterally, cranial nerve II-XII are grossly intact, no focal motor deficit, sensory intact to light touch bilaterally.  Skin: Warm, dry and intact. No rashes or erythema. Psychiatric: Normal mood and affect. speech and behavior is normal. Cognition and memory are normal.   Assessment & Plan:   Please see problem based assessment and plan.

## 2013-12-14 NOTE — Patient Instructions (Signed)
General Instructions:  1. Please schedule a follow up appointment for 2 weeks.  2. Please take all medications as prescribed.   Take Amlodipine 10 mg ONCE in the AM Take Lasix (water pill) 40 mg ONCE in the AM Take Famotidine (Pepcid) 40 mg ONCE in the AM (for stomach)  You can take Tylenol 1000 mg three times daily for knee pain IF NEEDED. DO NOT TAKE MORE THAN 3000 mg in ONCE DAY.  Please follow up w/ [facility].  3. If you have worsening of your symptoms or new symptoms arise, please call the clinic [phone]), or go to the ER immediately if symptoms are severe.  You have done a great job in taking all your medications. I appreciate it very much. Please continue doing that.    Please try to bring all your medicines next time. This will help [place] keep you safe from mistakes.   Progress Toward Treatment Goals:  Treatment Goal 12/14/2013  Blood pressure at goal    Self Care Goals & Plans:  Self Care Goal 12/14/2013  Manage my medications take my medicines as prescribed; bring my medications to every visit; refill my medications on time  Monitor my health -  Eat healthy foods drink diet soda or water instead of juice or soda; eat more vegetables; eat foods that are low in salt; eat baked foods instead of fried foods; eat fruit for snacks and desserts  Be physically active find an activity I enjoy; take a walk every day    No flowsheet data found.   Care Management & Community Referrals:  Referral 09/08/2013  Referrals made for care management support none needed

## 2013-12-15 ENCOUNTER — Encounter: Payer: Self-pay | Admitting: Internal Medicine

## 2013-12-15 NOTE — Assessment & Plan Note (Signed)
Patient complains of pain in the right knee. Mostly associated w/ walking. Discussed NOT taking NSAIDS in the setting of poor renal function. -Advised taking Tylenol for pain as this is working for her.

## 2013-12-16 NOTE — Progress Notes (Signed)
Case discussed with Dr. Jones at the time of the visit.  We reviewed the resident's history and exam and pertinent patient test results.  I agree with the assessment, diagnosis, and plan of care documented in the resident's note. 

## 2013-12-28 ENCOUNTER — Ambulatory Visit (INDEPENDENT_AMBULATORY_CARE_PROVIDER_SITE_OTHER): Payer: PRIVATE HEALTH INSURANCE | Admitting: Internal Medicine

## 2013-12-28 ENCOUNTER — Encounter: Payer: Self-pay | Admitting: Internal Medicine

## 2013-12-28 VITALS — BP 137/80 | HR 74 | Temp 97.4°F | Ht 63.0 in | Wt 176.4 lb

## 2013-12-28 DIAGNOSIS — I120 Hypertensive chronic kidney disease with stage 5 chronic kidney disease or end stage renal disease: Secondary | ICD-10-CM

## 2013-12-28 DIAGNOSIS — I10 Essential (primary) hypertension: Secondary | ICD-10-CM

## 2013-12-28 DIAGNOSIS — M25519 Pain in unspecified shoulder: Secondary | ICD-10-CM

## 2013-12-28 DIAGNOSIS — N185 Chronic kidney disease, stage 5: Secondary | ICD-10-CM

## 2013-12-28 DIAGNOSIS — M25512 Pain in left shoulder: Secondary | ICD-10-CM

## 2013-12-28 MED ORDER — METHYLPREDNISOLONE ACETATE 40 MG/ML IJ SUSP: 10.0000 mg | Freq: Once | INTRAMUSCULAR | Status: DC

## 2013-12-28 MED ORDER — METHYLPREDNISOLONE ACETATE 40 MG/ML IJ SUSP
10.0000 mg | Freq: Once | INTRAMUSCULAR | Status: AC
  Administered 2013-12-28: 10 mg via INTRAMUSCULAR

## 2013-12-28 NOTE — Assessment & Plan Note (Addendum)
Nancy [name] has point tenderness at the insertion site of her trapezius and the distal spine of the scapula in the left shoulder. Her pain is somewhat improved with massage but has been refractory to Tylenol, heat, and OTC topical pain reliever. She is unable to take NSAIDs due to her CKD. With her history of confusion with her medications, I want to avoid a steroid taper which could be confusing. Provided a depo steroid injection in the clinic, to help reduce inflammation and hopefully improve her pain.  - Depo Medrol 10mg  IM x1 given in the clinic - Tylenol 500mg  1 tablet q8h PRN pain - Pt is to call the clinic at the end of the week if her pain persists, she may need a sports medicine referral.

## 2013-12-28 NOTE — Progress Notes (Signed)
Patient ID: Nancy [name], female   DOB: 10/29/1940, 73 y.o.   MRN: [id number]  Subjective:   Patient ID: Nancy [name] female   DOB: May 04, 1941 73 y.o.   MRN: [id number]  HPI: Nancy [name] is a 73 y.o. HTN, HLD, CKD stage V, h/o CVA, and GERD, presents to the clinic today for a follow-up visit for her blood pressure.  She has had some confusion regarding her blood pressure medications, and she was recently seen on 6/9 for a blood pressure check. At that time, per notes, there was still confusion regarding her amlodipine and Lasix and when and how often she takes the medications. She is supposed to be taking Amlodipine 10mg  daily and Lasix 40mg  daily. She endorses compliance with the medications and states that she takes them each morning.  She also has a h/o CKD, V. BMP checked at her last visit showed stable renal function.   Today she is also c/o right knee pain, for which she takes Tylenol, and left shoulder pain. She states that the pain in her shoulder is constant, throbbing, and started 2 weeks ago. She noticed the pain when she woke up one morning. She states that the should pain has not been relieved by the Tylenol taken only at night, warm compresses, or OTC topical pain relievers. She denies any recent heavy lifting or increased physical activities or falls where she could have injured her shoulder.    Past Medical History  Diagnosis Date  . CVA (cerebrovascular accident)     New hemorrhagic per CT scan '09  . Fecal impaction   . Diverticulosis of colon   . Hypertension   . Pulmonary nodule   . Dysfunctional uterine bleeding   . Postmenopausal   . OA (osteoarthritis)     bilateral knees  . Colitis   . Bacterial sinusitis 09/17/2011  . TINEA CRURIS 01/12/2007  . HERNIORRHAPHY, HX OF 08/11/2006  . CKD (chronic kidney disease) stage 4, GFR 15-29 ml/min 08/11/2006    Cr continues to increase. Proteinuria on UA 02/10/12.    Marland Kitchen [id number])    Current Outpatient Prescriptions   Medication Sig Dispense Refill  . acetaminophen (TYLENOL) 500 MG tablet Take 500 mg by mouth every 6 (six) hours as needed for mild pain, moderate pain, fever or headache.      Marland Kitchen amLODipine (NORVASC) 10 MG tablet Take 1 tablet (10 mg total) by mouth daily.  30 tablet  2  . docusate sodium 100 MG CAPS Take 100 mg by mouth daily.  10 capsule  0  . famotidine (PEPCID) 40 MG tablet Take 40 mg by mouth daily.      . furosemide (LASIX) 40 MG tablet take 1 tablet by mouth once daily  30 tablet  2  . polyethylene glycol (MIRALAX / GLYCOLAX) packet Take 17 g by mouth daily.  14 each  0  . Travoprost, BAK Free, (TRAVATAN) 0.004 % SOLN ophthalmic solution Place 1-2 drops into both eyes 2 (two) times daily. 1 drops in am, 2 drop in pm       Current Facility-Administered Medications  Medication Dose Route Frequency Mayetta Castleman Last Rate Last Dose  . methylPREDNISolone acetate (DEPO-MEDROL) injection 10 mg  10 mg Intramuscular Once Otho Bellows, MD       Family History  Problem Relation Age of Onset  . Hypertension Mother   . Heart attack Mother   . Heart disease Mother    History   Social History  . Marital  Status: Married    Spouse Name: N/A    Number of Children: N/A  . Years of Education: N/A   Social History Main Topics  . Smoking status: Never Smoker   . Smokeless tobacco: Never Used  . Alcohol Use: No  . Drug Use: No     Comment: 08/15/08 UDS + cocaine  . Sexual Activity: None   Other Topics Concern  . None   Social History Narrative   Married, lives with her husband. 1 child.          Review of Systems: Constitutional: Denies fever, chills, or fatigue.  HEENT: Denies vision changes  Respiratory: Denies SOB, DOE.   Cardiovascular: Denies chest pain or leg swelling.  Gastrointestinal: Denies nausea, vomiting, abdominal pain, diarrhea, constipation Genitourinary: Denies dysuria, urgency, frequency Musculoskeletal: +right knee pain and left shoulder pain Skin: Denies rash and  wound.  Neurological: Denies dizziness, weakness, light-headedness, numbness and headaches.  Psychiatric/Behavioral: Denies suicidal ideation, mood changes, confusion  Objective:  Physical Exam: Filed Vitals:   12/28/13 0818 12/28/13 0859  BP: 145/79 137/80  Pulse: 78 74  Temp: 97.4 F (36.3 C)   TempSrc: Oral   Height: 5\' 3"  (1.6 m)   Weight: 176 lb 6.4 oz (80.015 kg)   SpO2: 100%    Constitutional: Vital signs reviewed.  Patient is a well-developed and well-nourished female in no acute distress and cooperative with exam.  Head: Normocephalic and atraumatic Eyes: PERRL, EOMI Cardiovascular: RRR, pulses symmetric and intact bilaterally Pulmonary/Chest: Normal respiratory effort, CTAB, no wheezes, rales, or rhonchi Abdominal: Soft. Non-tender, non-distended, bowel sounds are normal Musculoskeletal: No joint deformities, erythema. +stiffness of the R knee with crepitus on flexion/extension, ROM full at bilateral shoulders with point tenderness at left trapezius insertion site at the distal spine of the scapula. No tenderness to palpation or with motion of rotator cuff or at the right shoulder.   Neurological: A&O x3, Strength is normal and symmetric bilaterally, cranial nerve II-XII are grossly intact, no focal deficit appreciated Skin: Warm, dry and intact.  Psychiatric: Normal mood and affect. speech and behavior is normal.   Assessment & Plan:   Please refer to Problem List based Assessment and Plan

## 2013-12-28 NOTE — Assessment & Plan Note (Signed)
BP Readings from Last 3 Encounters:  12/28/13 137/80  12/14/13 153/81  09/22/13 159/86    Lab Results  Component Value Date   NA 139 12/14/2013   K 4.7 12/14/2013   CREATININE 4.84* 12/14/2013    Assessment: Blood pressure control: controlled Progress toward BP goal:  at goal Comments: She endorses compliance with her meds and is taking the Amlodipine 10mg  and Lasix 40mg  daily  Plan: Medications:  continue current medications Educational resources provided: brochure Self management tools provided: home blood pressure logbook Other plans: F/u in 3 mo for BP check

## 2013-12-28 NOTE — Patient Instructions (Signed)
**  You received a steroid injection today, which should help with your pain. Try to take it easy today and avoid rigorous activity.   **If your pain is not better later this week, please call the clinic and we can get you set up to see Sports Medicine.      General Instructions:   Please bring your medicines with you each time you come to clinic.  Medicines may include prescription medications, over-the-counter medications, herbal remedies, eye drops, vitamins, or other pills.   Progress Toward Treatment Goals:  Treatment Goal 12/14/2013  Blood pressure at goal    Self Care Goals & Plans:  Self Care Goal 12/28/2013  Manage my medications take my medicines as prescribed; bring my medications to every visit; refill my medications on time  Monitor my health -  Eat healthy foods drink diet soda or water instead of juice or soda; eat more vegetables; eat foods that are low in salt; eat baked foods instead of fried foods; eat fruit for snacks and desserts  Be physically active -    No flowsheet data found.   Care Management & Community Referrals:  Referral 09/08/2013  Referrals made for care management support none needed

## 2013-12-28 NOTE — Assessment & Plan Note (Addendum)
Will obtain last clinic visit with [facility]. Unsure when her last visit with them was, but she needs to be seen there soon. It looks like she was seen by Vascular Surgery this year referred by Dr. Mercy Moore, but cancelled her last appt with them in April. If she has not been seen recently, will call and schedule an appointment for her.

## 2013-12-30 NOTE — Progress Notes (Signed)
Case discussed with Dr. Glenn at the time of the visit.  We reviewed the resident's history and exam and pertinent patient test results.  I agree with the assessment, diagnosis, and plan of care documented in the resident's note.   

## 2014-01-31 ENCOUNTER — Other Ambulatory Visit: Payer: Self-pay | Admitting: Internal Medicine

## 2014-01-31 NOTE — Telephone Encounter (Signed)
This is an old prescription.  She should be on amlodipine 10mg .  She had a prescription 12/14/13 with 2 refills and she should continue this medication.

## 2014-02-09 ENCOUNTER — Encounter: Payer: Self-pay | Admitting: Internal Medicine

## 2014-02-09 ENCOUNTER — Ambulatory Visit (HOSPITAL_COMMUNITY)
Admission: RE | Admit: 2014-02-09 | Discharge: 2014-02-09 | Disposition: A | Discharge status: Home / Self Care | Payer: PRIVATE HEALTH INSURANCE | Source: Ambulatory Visit | Attending: Internal Medicine | Admitting: Internal Medicine

## 2014-02-09 ENCOUNTER — Ambulatory Visit (INDEPENDENT_AMBULATORY_CARE_PROVIDER_SITE_OTHER): Payer: PRIVATE HEALTH INSURANCE | Admitting: Internal Medicine

## 2014-02-09 VITALS — BP 133/79 | HR 79 | Temp 97.5°F | Ht 63.0 in | Wt 177.1 lb

## 2014-02-09 DIAGNOSIS — M1710 Unilateral primary osteoarthritis, unspecified knee: Secondary | ICD-10-CM | POA: Diagnosis not present

## 2014-02-09 DIAGNOSIS — K219 Gastro-esophageal reflux disease without esophagitis: Secondary | ICD-10-CM

## 2014-02-09 DIAGNOSIS — IMO0002 Reserved for concepts with insufficient information to code with codable children: Secondary | ICD-10-CM | POA: Diagnosis not present

## 2014-02-09 DIAGNOSIS — R011 Cardiac murmur, unspecified: Secondary | ICD-10-CM

## 2014-02-09 DIAGNOSIS — I10 Essential (primary) hypertension: Secondary | ICD-10-CM

## 2014-02-09 DIAGNOSIS — N185 Chronic kidney disease, stage 5: Secondary | ICD-10-CM

## 2014-02-09 DIAGNOSIS — R9431 Abnormal electrocardiogram [ECG] [EKG]: Secondary | ICD-10-CM

## 2014-02-09 DIAGNOSIS — H409 Unspecified glaucoma: Secondary | ICD-10-CM

## 2014-02-09 DIAGNOSIS — D649 Anemia, unspecified: Secondary | ICD-10-CM

## 2014-02-09 DIAGNOSIS — Z0000 Encounter for general adult medical examination without abnormal findings: Secondary | ICD-10-CM

## 2014-02-09 DIAGNOSIS — E785 Hyperlipidemia, unspecified: Secondary | ICD-10-CM

## 2014-02-09 DIAGNOSIS — J984 Other disorders of lung: Secondary | ICD-10-CM

## 2014-02-09 LAB — LIPID PANEL
Cholesterol: 144 mg/dL (ref 0–200)
HDL: 45 mg/dL (ref >39)
LDL Cholesterol: 78 mg/dL (ref 0–99)
Total CHOL/HDL Ratio: 3.2 ratio
Triglycerides: 106 mg/dL (ref <150)
VLDL: 21 mg/dL (ref 0–40)

## 2014-02-09 LAB — BASIC METABOLIC PANEL WITHOUT GFR
BUN: 63 mg/dL — ABNORMAL HIGH (ref 6–23)
CO2: 21 meq/L (ref 19–32)
Calcium: 8.7 mg/dL (ref 8.4–10.5)
Chloride: 107 meq/L (ref 96–112)
Creat: 4.64 mg/dL — ABNORMAL HIGH (ref 0.50–1.10)
GFR, Est African American: 10 mL/min — ABNORMAL LOW
GFR, Est Non African American: 9 mL/min — ABNORMAL LOW
Glucose, Bld: 104 mg/dL — ABNORMAL HIGH (ref 70–99)
Potassium: 3.8 meq/L (ref 3.5–5.3)
Sodium: 141 meq/L (ref 135–145)

## 2014-02-09 LAB — CBC
HEMATOCRIT: 26.9 % — AB (ref 36.0–46.0)
Hemoglobin: 8.9 g/dL — ABNORMAL LOW (ref 12.0–15.0)
MCH: 28.9 pg (ref 26.0–34.0)
MCHC: 33.1 g/dL (ref 30.0–36.0)
MCV: 87.3 fL (ref 78.0–100.0)
Platelets: 210 10*3/uL (ref 150–400)
RBC: 3.08 MIL/uL — ABNORMAL LOW (ref 3.87–5.11)
RDW: 15.5 % (ref 11.5–15.5)
WBC: 4.9 10*3/uL (ref 4.0–10.5)

## 2014-02-09 MED ORDER — TRAMADOL HCL 50 MG PO TABS: 50.0000 mg | ORAL_TABLET | Freq: Two times a day (BID) | ORAL | Status: DC | PRN

## 2014-02-09 NOTE — Assessment & Plan Note (Signed)
BP Readings from Last 3 Encounters:  02/09/14 133/79  12/28/13 137/80  12/14/13 153/81    Lab Results  Component Value Date   NA 139 12/14/2013   K 4.7 12/14/2013   CREATININE 4.84* 12/14/2013    Assessment: Blood pressure control: controlled Progress toward BP goal:  at goal Comments: She has CKD stage 5, which is possibly caused by her HTN  Plan: Medications:  continue current medications, amlodipine, lasix Educational resources provided:   Self management tools provided:   Other plans: Check renal function today.

## 2014-02-09 NOTE — Patient Instructions (Signed)
General Instructions: Please schedule a follow up visit within the next 2 days for knee injection (Dr. Arcelia Jew at 130pm) and 3 months for routine follow up..   For your medications:   Please bring all of your pill  Bottles with you to each visit.  This will help make sure that we have an up to date list of all the medications you are taking.  Please also bring any over the counter herbal medications you are taking (not including advil, tylenol, etc.)  Please continue taking all of your medications as prescribed  Please go for knee xray.  I have placed a referral to see the kidney doctors, please make an appointment with them when available.    Thank you!    Treatment Goals:  Goals (1 Years of Data) as of 02/09/14         As of Today 12/28/13 12/28/13 12/14/13 09/22/13     Blood Pressure    . Blood Pressure < 160/90  133/79 137/80 145/79 153/81 159/86     Result Component    . LDL CALC < 100            Progress Toward Treatment Goals:  Treatment Goal 02/09/2014  Blood pressure at goal    Self Care Goals & Plans:  Self Care Goal 02/09/2014  Manage my medications take my medicines as prescribed; bring my medications to every visit; refill my medications on time  Monitor my health -  Eat healthy foods drink diet soda or water instead of juice or soda; eat more vegetables; eat foods that are low in salt  Be physically active find an activity I enjoy    No flowsheet data found.   Care Management & Community Referrals:  Referral 09/08/2013  Referrals made for care management support none needed

## 2014-02-09 NOTE — Assessment & Plan Note (Signed)
Controlled with pepcid.

## 2014-02-09 NOTE — Assessment & Plan Note (Signed)
Reviewed history of nodules today.  She had multiple scans in 2004, 2005 and again in 2007 which finally showed stability and calcification of nodules.  No further scans have been done, these are likely benign.

## 2014-02-09 NOTE — Assessment & Plan Note (Signed)
Have re-sent a consult for nephrology follow up as she is likely close to dialysis.  Have asked her to keep any appointments made.

## 2014-02-09 NOTE — Assessment & Plan Note (Signed)
Dexa: 2008 - normal MMG: 2015 - benign PAP: 2005, 2008 normal.  Reports never having an abnormal Colon: 2015, normal.  No further testing per Dr. Benson Norway  Flu: Due in the fall Pneumovax: 09/17/11

## 2014-02-09 NOTE — Assessment & Plan Note (Signed)
She is not currently on therapy, LDL was 89 1 year ago, check today.

## 2014-02-09 NOTE — Assessment & Plan Note (Signed)
Murmur heard today.  She has mild stenosis of aortic valve, seen on TTE in 2014.  Will monitor at every visit for changes.

## 2014-02-09 NOTE — Progress Notes (Signed)
Subjective:    Patient ID: [place] E Dantuono, female    DOB: [birth year], 73 y.o.   MRN: [id number]  CC: Routine follow up  HPI  Ms. Strouth is a 72yo woman with PMH of HLD, HTN, h/o CVA, GERD, DJD, glaucoma, CKD stage 5 who presents for follow up .   Ms. Ketner was most recently seen in the clinic for knee and shoulder pain.  Shoulder pain improved and no longer an issue. Today she reports that her knee pain is no better and is 10/10.  She recently almost fell due to the pain.  She reports the pain is sharp with aching most of the time.  It hurts basically all of the time and associated with stiffness in the mornings.  She "feels like it falls apart."  She denies catching, locking, swelling, erythema, warmth, weakness.  She has taken tylenol and bengay with limited improvement.  She has not tried a brace. Symptoms have been progressing for 2 years.  She has had knee injections before with some relief.    Last saw an eye doctor 3 months ago.  Seeing a new one this month for her glaucoma.   For her CKD stage 5, she reports seeing a kidney doctor > 3 years ago and not since.  She is interested in returning.  We discussed the nature of her kidney disease and how she might be nearing dialysis.  She also has HTN for which she takes amlodipine and lasix with good results.  She has HLD, on no medications currently however.   She further has GERD for which she takes pepcid.    No smoking.   We reviewed her medications  Current Outpatient Prescriptions on File Prior to Visit  Medication Sig Dispense Refill  . acetaminophen (TYLENOL) 500 MG tablet Take 500 mg by mouth every 6 (six) hours as needed for mild pain, moderate pain, fever or headache.      Marland Kitchen amLODipine (NORVASC) 10 MG tablet Take 1 tablet (10 mg total) by mouth daily.  30 tablet  2  . famotidine (PEPCID) 40 MG tablet Take 40 mg by mouth daily.      . furosemide (LASIX) 40 MG tablet take 1 tablet by mouth once daily  30 tablet  2  . Travoprost,  BAK Free, (TRAVATAN) 0.004 % SOLN ophthalmic solution Place 1-2 drops into both eyes 2 (two) times daily. 1 drops in am, 2 drop in pm      . docusate sodium 100 MG CAPS Take 100 mg by mouth daily.  10 capsule  0  . polyethylene glycol (MIRALAX / GLYCOLAX) packet Take 17 g by mouth daily.  14 each  0   No current facility-administered medications on file prior to visit.    Review of Systems  Constitutional: Negative for fever and chills.  HENT: Negative for dental problem, ear discharge and ear pain.   Eyes: Negative for photophobia and visual disturbance.  Respiratory: Negative for cough, choking and shortness of breath.   Cardiovascular: Negative for chest pain and leg swelling.  Gastrointestinal: Negative for nausea, abdominal pain and diarrhea.  Genitourinary: Negative for dysuria and difficulty urinating.  Musculoskeletal: Positive for arthralgias and gait problem. Negative for joint swelling and neck pain.  Skin: Negative for rash and wound.  Neurological: Negative for dizziness, light-headedness and headaches.  Psychiatric/Behavioral: Negative for dysphoric mood and decreased concentration.       Objective:   Physical Exam  Constitutional: She is oriented to person, place,  and time. She appears well-developed.  Elderly woman in NAD  HENT:  Head: Normocephalic and atraumatic.  Cardiovascular: Normal rate and regular rhythm.  Exam reveals no friction rub.   Murmur (systolic) heard. Pulmonary/Chest: Effort normal and breath sounds normal. No respiratory distress. She has no wheezes.  Abdominal: Soft. Bowel sounds are normal. She exhibits no distension.  Musculoskeletal: She exhibits tenderness. She exhibits no edema.  No edema, pain to palpation in joint line, superier to patella, over patella on the right knee.  + crepitus on the right knee.  No swelling noted.    Neurological: She is alert and oriented to person, place, and time. No cranial nerve deficit.  Skin: Skin is warm  and dry. No erythema.  Psychiatric: She has a normal mood and affect. Her behavior is normal.    BMET, CBC, Lipid today      Assessment & Plan:  2 days for knee injection after xrays; 3 months for routine follow up.

## 2014-02-09 NOTE — Assessment & Plan Note (Signed)
Using eye drops, vision unchanged.

## 2014-02-09 NOTE — Assessment & Plan Note (Signed)
Will get knee xray today for update and plan for possible steroid injection in clinic this week.  Trial of tramadol for pain control.

## 2014-02-10 LAB — IRON AND TIBC
%SAT: 12 % — AB (ref 20–55)
Iron: 28 ug/dL — ABNORMAL LOW (ref 42–145)
TIBC: 229 ug/dL — ABNORMAL LOW (ref 250–470)
UIBC: 201 ug/dL (ref 125–400)

## 2014-02-10 LAB — FERRITIN: Ferritin: 148 ng/mL (ref 10–291)

## 2014-02-10 NOTE — Addendum Note (Signed)
Addended by: Gilles Chiquito B on: 02/10/2014 02:09 PM   Modules accepted: Orders

## 2014-02-11 ENCOUNTER — Ambulatory Visit: Payer: PRIVATE HEALTH INSURANCE | Admitting: Internal Medicine

## 2014-02-12 NOTE — Addendum Note (Signed)
Addended by: Gilles Chiquito B on: 02/12/2014 05:20 PM   Modules accepted: Level of Service

## 2014-02-15 ENCOUNTER — Ambulatory Visit (INDEPENDENT_AMBULATORY_CARE_PROVIDER_SITE_OTHER): Payer: PRIVATE HEALTH INSURANCE | Admitting: Internal Medicine

## 2014-02-15 ENCOUNTER — Encounter: Payer: Self-pay | Admitting: Internal Medicine

## 2014-02-15 VITALS — BP 142/71 | HR 80 | Temp 97.1°F

## 2014-02-15 DIAGNOSIS — M1710 Unilateral primary osteoarthritis, unspecified knee: Secondary | ICD-10-CM

## 2014-02-15 DIAGNOSIS — IMO0002 Reserved for concepts with insufficient information to code with codable children: Secondary | ICD-10-CM

## 2014-02-15 NOTE — Progress Notes (Signed)
Patient ID: Nancy [name], female   DOB: June 16, 1941, 73 y.o.   MRN: [id number]  Procedure note  Procedure: Steroid injection Location: Right knee, anterior bursa Date of procedure: 02/15/14 Time of procedure: 10:40AM  Patient and her husband were instructed on possible risks associated with the procedure.  Patient and her husband agreed to procedure and signed consent.  Procedure: Patient was prepped and draped appropriately. Iodine antiseptic was spread over anterior knee.  2 mls of Ketorolac 60 mg and then 2 mls of 2% lidocaine was drawn up in a syringe. This solution was injected into the anterior knee using a 27 gauge needle.  Gauze and pressure were applied. Patient tolerated procedure well.

## 2014-02-15 NOTE — Patient Instructions (Addendum)
It was a pleasure seeing you today, [name].  I hope that the steroid injection in your right knee will provide you some relief. If you develop a rash, fever, chills, or severe pain please make an appointment with the clinic as soon as possible or go to the emergency room.   General Instructions:   Please bring your medicines with you each time you come to clinic.  Medicines may include prescription medications, over-the-counter medications, herbal remedies, eye drops, vitamins, or other pills.   Progress Toward Treatment Goals:  Treatment Goal 02/09/2014  Blood pressure at goal    Self Care Goals & Plans:  Self Care Goal 02/09/2014  Manage my medications take my medicines as prescribed; bring my medications to every visit; refill my medications on time  Monitor my health -  Eat healthy foods drink diet soda or water instead of juice or soda; eat more vegetables; eat foods that are low in salt  Be physically active find an activity I enjoy    No flowsheet data found.   Care Management & Community Referrals:  Referral 09/08/2013  Referrals made for care management support none needed

## 2014-02-15 NOTE — Progress Notes (Signed)
   Subjective:    Patient ID: Nancy [name], female    DOB: 1941/04/13, 73 y.o.   MRN: [id number]  HPI Nancy [name] is a 73yo woman w/ PMHx of HTN, CVA in 2009, degenerative joint disease of the right knee, CKD stage 5, and HLD who presents today for a steroid injection of her right knee. Patient states she has had right knee pain for the last 5-6 years. Pain is currently a 9/10 in severity but was 10/10 at last week's appointment. Her knee pain is nonresponsive to pain medication. She denies any history of surgery on the knee. She states she has had steroid injections in the past that have alleviated the pain. She was prescribed Tramadol 50mg  Q12H last week but states the medications did not help.    Review of Systems General: Denies fever, chills, changes in weight HEENT: Denies headaches, ear pain, rhinorrhea, sore throat CV: Denies CP, SOB, orthopnea, palpitations Pulm: Denies SOB, cough, wheezing GI: Denies abdominal pain, diarrhea, constipation, melena, hematochezia GU: Denies dysuria, hematuria, frequency Msk: Denies muscle cramps Neuro: Denies numbness, tingling, weakness Skin: Denies rashes, bruising    Objective:   Physical Exam General: alert, cooperative, sitting up in chair HEENT: Nancy [name]/AT, EOMI, mucus membranes moist CV: RRR, normal 99991111, systolic murmur heard Pulm:  CTA bilaterally, breaths non-labored GI: BS+, soft, non-distended, non-tender Ext: tenderness to palpation of right knee, no edema, +crepitus in right knee, no swelling Neuro: alert and oriented x 3, CNs II-XII intact    Assessment & Plan:

## 2014-02-15 NOTE — Assessment & Plan Note (Signed)
Pt presented for steroid injection of her right knee today. Pt received 2 mls of Ketorolac 60 mg and 2 mls of 2% lidocaine. She tolerated the procedure well.

## 2014-02-16 NOTE — Progress Notes (Signed)
I saw and evaluated the patient.  I personally confirmed the key portions of Dr. Shela Commons history and exam and reviewed pertinent patient test results.  The assessment, diagnosis, and plan were formulated together and I agree with the documentation in the resident's note with the following corrections:  The total dose of kenalog injected was 80 mg (40 mg/ml) and the lidocaine was 1% and 2 mls were injected into the knee.  I was present in the room for the entire procedure.  The patient received immediate symptomatic relief soon after the injection.

## 2014-03-04 ENCOUNTER — Other Ambulatory Visit: Payer: Self-pay | Admitting: Internal Medicine

## 2014-03-07 ENCOUNTER — Other Ambulatory Visit: Payer: Self-pay | Admitting: Internal Medicine

## 2014-03-09 NOTE — Progress Notes (Signed)
Patient ID: Nancy [name], female   DOB: 06/04/1941, 73 y.o.   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  She is due to see Ren[facility] on 9/11.

## 2014-03-28 ENCOUNTER — Encounter (HOSPITAL_COMMUNITY)
Admission: RE | Admit: 2014-03-28 | Discharge: 2014-03-28 | Disposition: A | Discharge status: Home / Self Care | Payer: PRIVATE HEALTH INSURANCE | Source: Ambulatory Visit | Attending: Nephrology | Admitting: Nephrology

## 2014-03-28 DIAGNOSIS — N039 Chronic nephritic syndrome with unspecified morphologic changes: Secondary | ICD-10-CM | POA: Diagnosis not present

## 2014-03-28 DIAGNOSIS — D631 Anemia in chronic kidney disease: Secondary | ICD-10-CM | POA: Insufficient documentation

## 2014-03-28 DIAGNOSIS — I120 Hypertensive chronic kidney disease with stage 5 chronic kidney disease or end stage renal disease: Secondary | ICD-10-CM | POA: Insufficient documentation

## 2014-03-28 DIAGNOSIS — N185 Chronic kidney disease, stage 5: Secondary | ICD-10-CM | POA: Diagnosis present

## 2014-03-28 LAB — POCT HEMOGLOBIN-HEMACUE: Hemoglobin: 10 g/dL — ABNORMAL LOW (ref 12.0–15.0)

## 2014-03-28 MED ORDER — EPOETIN ALFA 20000 UNIT/ML IJ SOLN
INTRAMUSCULAR | Status: AC
  Filled 2014-03-28: qty 1

## 2014-03-28 MED ORDER — EPOETIN ALFA 20000 UNIT/ML IJ SOLN
20000.0000 [IU] | INTRAMUSCULAR | Status: DC
  Administered 2014-03-28: 20000 [IU] via SUBCUTANEOUS

## 2014-03-28 NOTE — Discharge Instructions (Signed)

## 2014-04-11 ENCOUNTER — Other Ambulatory Visit: Payer: Self-pay | Admitting: *Deleted

## 2014-04-11 ENCOUNTER — Encounter (HOSPITAL_COMMUNITY)
Admission: RE | Admit: 2014-04-11 | Discharge: 2014-04-11 | Disposition: A | Discharge status: Home / Self Care | Payer: PRIVATE HEALTH INSURANCE | Source: Ambulatory Visit | Attending: Nephrology | Admitting: Nephrology

## 2014-04-11 DIAGNOSIS — D631 Anemia in chronic kidney disease: Secondary | ICD-10-CM | POA: Diagnosis present

## 2014-04-11 DIAGNOSIS — N185 Chronic kidney disease, stage 5: Secondary | ICD-10-CM | POA: Diagnosis not present

## 2014-04-11 DIAGNOSIS — I10 Essential (primary) hypertension: Secondary | ICD-10-CM

## 2014-04-11 LAB — POCT HEMOGLOBIN-HEMACUE: HEMOGLOBIN: 10.2 g/dL — AB (ref 12.0–15.0)

## 2014-04-11 MED ORDER — AMLODIPINE BESYLATE 10 MG PO TABS: 10.0000 mg | ORAL_TABLET | Freq: Every day | ORAL | Status: DC

## 2014-04-11 MED ORDER — EPOETIN ALFA 20000 UNIT/ML IJ SOLN
INTRAMUSCULAR | Status: AC
  Administered 2014-04-11: 20000 [IU] via SUBCUTANEOUS
  Filled 2014-04-11: qty 1

## 2014-04-11 MED ORDER — EPOETIN ALFA 20000 UNIT/ML IJ SOLN
20000.0000 [IU] | INTRAMUSCULAR | Status: DC
  Administered 2014-04-11: 20000 [IU] via SUBCUTANEOUS

## 2014-04-11 NOTE — Telephone Encounter (Signed)
Pt is completely out

## 2014-04-12 ENCOUNTER — Telehealth: Payer: Self-pay | Admitting: *Deleted

## 2014-04-12 NOTE — Telephone Encounter (Signed)
Call from pt - stated she had a shot yesterday and it making her sick. She stated her kidney doctor ordered the injection. I told her to call her kidney doctor; stated she's having diarrhea and blood coming from "her front part". I informed her to go to the ED or UC. She voiced understanding.

## 2014-04-13 NOTE — Telephone Encounter (Signed)
Thank you Glenda. I agree with your recommendations

## 2014-04-25 ENCOUNTER — Encounter (HOSPITAL_COMMUNITY)
Admission: RE | Admit: 2014-04-25 | Discharge: 2014-04-25 | Disposition: A | Discharge status: Home / Self Care | Payer: PRIVATE HEALTH INSURANCE | Source: Ambulatory Visit | Attending: Nephrology | Admitting: Nephrology

## 2014-04-25 DIAGNOSIS — N185 Chronic kidney disease, stage 5: Secondary | ICD-10-CM | POA: Diagnosis not present

## 2014-04-25 LAB — POCT HEMOGLOBIN-HEMACUE: HEMOGLOBIN: 11.3 g/dL — AB (ref 12.0–15.0)

## 2014-04-25 MED ORDER — EPOETIN ALFA 20000 UNIT/ML IJ SOLN
20000.0000 [IU] | INTRAMUSCULAR | Status: DC
  Administered 2014-04-25: 20000 [IU] via SUBCUTANEOUS

## 2014-04-25 MED ORDER — EPOETIN ALFA 20000 UNIT/ML IJ SOLN
INTRAMUSCULAR | Status: AC
  Filled 2014-04-25: qty 1

## 2014-05-03 ENCOUNTER — Other Ambulatory Visit: Payer: Self-pay | Admitting: Internal Medicine

## 2014-05-06 ENCOUNTER — Other Ambulatory Visit: Payer: Self-pay | Admitting: Internal Medicine

## 2014-05-09 ENCOUNTER — Encounter (HOSPITAL_COMMUNITY)
Admission: RE | Admit: 2014-05-09 | Discharge: 2014-05-09 | Disposition: A | Discharge status: Home / Self Care | Payer: PRIVATE HEALTH INSURANCE | Source: Ambulatory Visit | Attending: Nephrology | Admitting: Nephrology

## 2014-05-09 DIAGNOSIS — N185 Chronic kidney disease, stage 5: Secondary | ICD-10-CM | POA: Insufficient documentation

## 2014-05-09 DIAGNOSIS — D631 Anemia in chronic kidney disease: Secondary | ICD-10-CM | POA: Diagnosis present

## 2014-05-09 DIAGNOSIS — I120 Hypertensive chronic kidney disease with stage 5 chronic kidney disease or end stage renal disease: Secondary | ICD-10-CM | POA: Diagnosis not present

## 2014-05-09 LAB — POCT HEMOGLOBIN-HEMACUE: Hemoglobin: 11.6 g/dL — ABNORMAL LOW (ref 12.0–15.0)

## 2014-05-09 MED ORDER — EPOETIN ALFA 20000 UNIT/ML IJ SOLN
20000.0000 [IU] | INTRAMUSCULAR | Status: DC
  Administered 2014-05-09: 20000 [IU] via SUBCUTANEOUS

## 2014-05-09 MED ORDER — EPOETIN ALFA 20000 UNIT/ML IJ SOLN
INTRAMUSCULAR | Status: AC
  Filled 2014-05-09: qty 1

## 2014-05-09 NOTE — Telephone Encounter (Signed)
Just filled 05/03/14.

## 2014-05-18 ENCOUNTER — Encounter: Payer: Self-pay | Admitting: Internal Medicine

## 2014-05-18 ENCOUNTER — Ambulatory Visit (INDEPENDENT_AMBULATORY_CARE_PROVIDER_SITE_OTHER): Payer: PRIVATE HEALTH INSURANCE | Admitting: Internal Medicine

## 2014-05-18 VITALS — BP 148/75 | HR 82 | Temp 97.7°F | Ht 63.0 in | Wt 172.6 lb

## 2014-05-18 DIAGNOSIS — M179 Osteoarthritis of knee, unspecified: Secondary | ICD-10-CM

## 2014-05-18 DIAGNOSIS — Z0000 Encounter for general adult medical examination without abnormal findings: Secondary | ICD-10-CM

## 2014-05-18 DIAGNOSIS — I10 Essential (primary) hypertension: Secondary | ICD-10-CM

## 2014-05-18 DIAGNOSIS — E785 Hyperlipidemia, unspecified: Secondary | ICD-10-CM

## 2014-05-18 DIAGNOSIS — IMO0002 Reserved for concepts with insufficient information to code with codable children: Secondary | ICD-10-CM

## 2014-05-18 DIAGNOSIS — N185 Chronic kidney disease, stage 5: Secondary | ICD-10-CM

## 2014-05-18 DIAGNOSIS — I120 Hypertensive chronic kidney disease with stage 5 chronic kidney disease or end stage renal disease: Secondary | ICD-10-CM

## 2014-05-18 DIAGNOSIS — R011 Cardiac murmur, unspecified: Secondary | ICD-10-CM

## 2014-05-18 DIAGNOSIS — M1710 Unilateral primary osteoarthritis, unspecified knee: Secondary | ICD-10-CM

## 2014-05-18 NOTE — Assessment & Plan Note (Signed)
BP Readings from Last 3 Encounters:  05/18/14 148/75  05/09/14 140/65  04/25/14 153/74    Lab Results  Component Value Date   NA 141 02/09/2014   K 3.8 02/09/2014   CREATININE 4.64* 02/09/2014    Assessment: Blood pressure control: mildly elevated Progress toward BP goal:  unchanged Comments: She has mildly elevated BP in the last few visits, improves with rest.    Plan: Medications:  continue current medications, amlodipine, lasix Educational resources provided:   Self management tools provided:   Other plans: Have deferred changing therapy to the nephrology team as I am not sure at this time if she is nearing dialysis.  Will review their notes, if they do not change her medications and it continues to be elevated, will likely add another medication to her regimen.

## 2014-05-18 NOTE — Assessment & Plan Note (Signed)
Much improved.  I advised her that if the pain should return she could call the clinic and we would work her in for another steroid injection since it has worked so well for her.

## 2014-05-18 NOTE — Assessment & Plan Note (Signed)
She is following with [facility].  Will attempt to get their notes from her appointment last month.  She is due to see them next week, will defer lab work to that time.

## 2014-05-18 NOTE — Assessment & Plan Note (Signed)
Unchanged, continue to monitor.

## 2014-05-18 NOTE — Assessment & Plan Note (Signed)
Dexa: 2008 - normal, no need to retest MMG: 2015 - benign PAP: 2005, 2008 normal.  Reports never having an abnormal, likely can stop screening.  Discuss with patient at next visit.  Colon: 2015, normal.  No further testing per Dr. Benson Norway  Flu: Done.  Pneumovax: 09/17/11, consider Prevnar 13 at next visit.

## 2014-05-18 NOTE — Patient Instructions (Signed)
General Instructions: Please schedule a follow up visit within the next 4 months.   For your medications:   Thank you for bringing your pill bottles!  This helps me to take better care of you!  Please continue taking all of your medications as prescribed.  Please make sure to take your blood pressure medications every day.   Thank you!   Treatment Goals:  Goals (1 Years of Data) as of 05/18/14          As of Today As of Today 05/09/14 04/25/14 04/11/14     Blood Pressure   . Blood Pressure < 160/90  148/75 170/72 140/65 153/74 135/61     Result Component   . LDL CALC < 100            Progress Toward Treatment Goals:  Treatment Goal 05/18/2014  Blood pressure unchanged    Self Care Goals & Plans:  Self Care Goal 05/18/2014  Manage my medications take my medicines as prescribed; bring my medications to every visit; refill my medications on time  Monitor my health -  Eat healthy foods eat more vegetables; eat foods that are low in salt; eat baked foods instead of fried foods  Be physically active find an activity I enjoy; take a walk every day    No flowsheet data found.   Care Management & Community Referrals:  Referral 09/08/2013  Referrals made for care management support none needed

## 2014-05-18 NOTE — Progress Notes (Signed)
Subjective:    Patient ID: Nancy [name], female    DOB: [birth year], 73 y.o.   MRN: [id number]  CC: Routine follow up  HPI  Ms. Coll is a 73yo woman with PMH of CKD stage 5, HTN, DJD, GERD, HLD and heart murmur who presents for routine follow up.   Ms. Izatt reports that her right knee pain is much better.  She has had no pain since steroid shot over the summer.  She ran out of tramadol, not needing it anymore.  No more falls.  She reports that if she has continued pain, she will come in to try another steroid shot.    She notes that she is taking all of her medications as prescribed.  She has HTN for which she takes lasix and furosemide.  She has CKD stage 5 and has recently re-established with a nephrologist, will attempt to get their notes.   We reviewed her medications.  She is not taking colace.  She is not smoking  Current Outpatient Prescriptions on File Prior to Visit  Medication Sig Dispense Refill  . acetaminophen (TYLENOL) 500 MG tablet Take 500 mg by mouth every 6 (six) hours as needed for mild pain, moderate pain, fever or headache.    Marland Kitchen amLODipine (NORVASC) 10 MG tablet Take 1 tablet (10 mg total) by mouth daily. 30 tablet 5  . calcitRIOL (ROCALTROL) 0.25 MCG capsule Take 0.25 mcg by mouth daily.    . famotidine (PEPCID) 40 MG tablet take 1 tablet by mouth once daily 30 tablet 11  . furosemide (LASIX) 40 MG tablet take 1 tablet by mouth once daily 30 tablet 2  . polyethylene glycol (MIRALAX / GLYCOLAX) packet Take 17 g by mouth daily. 14 each 0  . Travoprost, BAK Free, (TRAVATAN) 0.004 % SOLN ophthalmic solution Place 1-2 drops into both eyes 2 (two) times daily. 1 drops in am, 2 drop in pm    . docusate sodium 100 MG CAPS Take 100 mg by mouth daily. 10 capsule 0  . traMADol (ULTRAM) 50 MG tablet Take 1 tablet (50 mg total) by mouth every 12 (twelve) hours as needed for severe pain. Do not take more than 2 tablets per day due to kidney function 20 tablet 0   No current  facility-administered medications on file prior to visit.     Review of Systems  Constitutional: Negative for fever, chills and fatigue.  HENT: Positive for congestion and dental problem (dentures, doesn't wear). Negative for ear discharge, ear pain and trouble swallowing.   Eyes: Negative for photophobia and visual disturbance.  Respiratory: Negative for cough, choking and shortness of breath.   Cardiovascular: Negative for chest pain and leg swelling.  Gastrointestinal: Negative for nausea, vomiting, abdominal pain, diarrhea and constipation.  Genitourinary: Negative for dysuria, hematuria and difficulty urinating.  Musculoskeletal: Positive for gait problem (uses cane). Negative for back pain and arthralgias.  Skin: Negative for rash and wound.  Neurological: Positive for headaches (occasional). Negative for dizziness, weakness, light-headedness and numbness.  Psychiatric/Behavioral: Negative for confusion and decreased concentration.       Objective:   Physical Exam  Constitutional: She is oriented to person, place, and time. No distress.  Elderly woman, NAD  HENT:  Head: Normocephalic and atraumatic.  Mouth/Throat: No oropharyngeal exudate.  Eyes: Conjunctivae are normal. Pupils are equal, round, and reactive to light. Scleral icterus (darkening of sclera, at baseline) is present.  Cardiovascular: Normal rate and regular rhythm.  Exam reveals no friction rub.  Murmur (systolic 99991111, unchanged, best heard at LUSB) heard. Pulmonary/Chest: Effort normal and breath sounds normal. No respiratory distress. She has no wheezes.  Abdominal: Soft. Bowel sounds are normal. There is no tenderness.  Musculoskeletal: She exhibits no edema or tenderness.  Neurological: She is alert and oriented to person, place, and time.  Skin: Skin is warm and dry. She is not diaphoretic.  Psychiatric: She has a normal mood and affect. Her behavior is normal.  Vitals reviewed.  No labs needed today.         Assessment & Plan:  RTC in 4 months.

## 2014-05-18 NOTE — Assessment & Plan Note (Addendum)
LDL at goal of < 100, 78 at last check.  Not currently on therapy.   The highest recorded in our system was 116 in 2013.  Continue to monitor.

## 2014-05-23 ENCOUNTER — Encounter (HOSPITAL_COMMUNITY)
Admission: RE | Admit: 2014-05-23 | Discharge: 2014-05-23 | Disposition: A | Discharge status: Home / Self Care | Payer: PRIVATE HEALTH INSURANCE | Source: Ambulatory Visit | Attending: Nephrology | Admitting: Nephrology

## 2014-05-23 DIAGNOSIS — D631 Anemia in chronic kidney disease: Secondary | ICD-10-CM | POA: Diagnosis not present

## 2014-05-23 LAB — POCT HEMOGLOBIN-HEMACUE: Hemoglobin: 12.1 g/dL (ref 12.0–15.0)

## 2014-05-23 MED ORDER — EPOETIN ALFA 20000 UNIT/ML IJ SOLN: 20000.0000 [IU] | INTRAMUSCULAR | Status: DC

## 2014-05-23 MED ORDER — EPOETIN ALFA 20000 UNIT/ML IJ SOLN
INTRAMUSCULAR | Status: DC
Start: 2014-05-23 — End: 2014-05-23
  Filled 2014-05-23: qty 1

## 2014-05-29 ENCOUNTER — Telehealth: Payer: Self-pay | Admitting: Internal Medicine

## 2014-05-29 NOTE — Telephone Encounter (Signed)
  INTERNAL MEDICINE RESIDENCY PROGRAM After-Hours Telephone Call    Reason for call:   I received a call from the grand daughter of Nancy [name] at 1:06 PM, 05/29/2014 indicating that she is having vaginal bleeding.    Pertinent Data:   Only symptom is mild abdominal pain in addition to vaginal bleeding. Saw a large amount of blood in the toilet.  BP is 100/46, normal BP is 123XX123 systolic typically.   Patient denies dizziness, lightheadedness, chest pain, SOB, nausea, or vomiting.   Does not take a blood thinner.     Assessment / Plan / Recommendations:   Given age and slightly soft BP, advised patient and grand daughter to come to the ED or urgent care today.       Corky Sox, MD   05/29/2014, 1:06 PM

## 2014-06-06 ENCOUNTER — Encounter (HOSPITAL_COMMUNITY)
Admission: RE | Admit: 2014-06-06 | Discharge: 2014-06-06 | Disposition: A | Discharge status: Home / Self Care | Payer: PRIVATE HEALTH INSURANCE | Source: Ambulatory Visit | Attending: Nephrology | Admitting: Nephrology

## 2014-06-06 DIAGNOSIS — D631 Anemia in chronic kidney disease: Secondary | ICD-10-CM | POA: Diagnosis not present

## 2014-06-06 LAB — POCT HEMOGLOBIN-HEMACUE: Hemoglobin: 10.9 g/dL — ABNORMAL LOW (ref 12.0–15.0)

## 2014-06-06 MED ORDER — EPOETIN ALFA 20000 UNIT/ML IJ SOLN
20000.0000 [IU] | INTRAMUSCULAR | Status: DC
  Administered 2014-06-06: 20000 [IU] via SUBCUTANEOUS

## 2014-06-06 MED ORDER — EPOETIN ALFA 20000 UNIT/ML IJ SOLN
INTRAMUSCULAR | Status: AC
  Filled 2014-06-06: qty 1

## 2014-06-20 ENCOUNTER — Encounter (HOSPITAL_COMMUNITY)
Admission: RE | Admit: 2014-06-20 | Discharge: 2014-06-20 | Disposition: A | Discharge status: Home / Self Care | Payer: PRIVATE HEALTH INSURANCE | Source: Ambulatory Visit | Attending: Nephrology | Admitting: Nephrology

## 2014-06-20 DIAGNOSIS — D631 Anemia in chronic kidney disease: Secondary | ICD-10-CM | POA: Insufficient documentation

## 2014-06-20 DIAGNOSIS — N185 Chronic kidney disease, stage 5: Secondary | ICD-10-CM | POA: Diagnosis not present

## 2014-06-20 LAB — POCT HEMOGLOBIN-HEMACUE: HEMOGLOBIN: 11.5 g/dL — AB (ref 12.0–15.0)

## 2014-06-20 MED ORDER — EPOETIN ALFA 20000 UNIT/ML IJ SOLN
INTRAMUSCULAR | Status: AC
  Filled 2014-06-20: qty 1

## 2014-06-20 MED ORDER — EPOETIN ALFA 20000 UNIT/ML IJ SOLN
20000.0000 [IU] | INTRAMUSCULAR | Status: DC
  Administered 2014-06-20: 20000 [IU] via SUBCUTANEOUS

## 2014-07-11 ENCOUNTER — Encounter (HOSPITAL_COMMUNITY)
Admission: RE | Admit: 2014-07-11 | Discharge: 2014-07-11 | Disposition: A | Discharge status: Home / Self Care | Payer: Medicare Other | Source: Ambulatory Visit | Attending: Nephrology | Admitting: Nephrology

## 2014-07-11 DIAGNOSIS — D631 Anemia in chronic kidney disease: Secondary | ICD-10-CM | POA: Insufficient documentation

## 2014-07-11 DIAGNOSIS — N185 Chronic kidney disease, stage 5: Secondary | ICD-10-CM | POA: Diagnosis not present

## 2014-07-11 LAB — IRON AND TIBC
Iron: 101 ug/dL (ref 42–145)
SATURATION RATIOS: 41 % (ref 20–55)
TIBC: 246 ug/dL — ABNORMAL LOW (ref 250–470)
UIBC: 145 ug/dL (ref 125–400)

## 2014-07-11 LAB — POCT HEMOGLOBIN-HEMACUE: HEMOGLOBIN: 11.7 g/dL — AB (ref 12.0–15.0)

## 2014-07-11 LAB — FERRITIN: Ferritin: 125 ng/mL (ref 10–291)

## 2014-07-11 MED ORDER — EPOETIN ALFA 20000 UNIT/ML IJ SOLN
INTRAMUSCULAR | Status: AC
  Filled 2014-07-11: qty 1

## 2014-07-11 MED ORDER — EPOETIN ALFA 20000 UNIT/ML IJ SOLN
20000.0000 [IU] | INTRAMUSCULAR | Status: DC
  Administered 2014-07-11: 20000 [IU] via SUBCUTANEOUS

## 2014-08-01 ENCOUNTER — Encounter (HOSPITAL_COMMUNITY): Payer: Medicaid Other

## 2014-08-08 ENCOUNTER — Encounter (HOSPITAL_COMMUNITY)
Admission: RE | Admit: 2014-08-08 | Discharge: 2014-08-08 | Disposition: A | Discharge status: Home / Self Care | Payer: Medicare Other | Source: Ambulatory Visit | Attending: Nephrology | Admitting: Nephrology

## 2014-08-08 DIAGNOSIS — N185 Chronic kidney disease, stage 5: Secondary | ICD-10-CM | POA: Diagnosis not present

## 2014-08-08 DIAGNOSIS — Z79899 Other long term (current) drug therapy: Secondary | ICD-10-CM | POA: Diagnosis not present

## 2014-08-08 DIAGNOSIS — D631 Anemia in chronic kidney disease: Secondary | ICD-10-CM | POA: Diagnosis not present

## 2014-08-08 DIAGNOSIS — Z5181 Encounter for therapeutic drug level monitoring: Secondary | ICD-10-CM | POA: Diagnosis not present

## 2014-08-08 LAB — POCT HEMOGLOBIN-HEMACUE: HEMOGLOBIN: 10.6 g/dL — AB (ref 12.0–15.0)

## 2014-08-08 MED ORDER — EPOETIN ALFA 20000 UNIT/ML IJ SOLN
20000.0000 [IU] | INTRAMUSCULAR | Status: DC
  Administered 2014-08-08: 20000 [IU] via SUBCUTANEOUS

## 2014-08-08 MED ORDER — EPOETIN ALFA 20000 UNIT/ML IJ SOLN
INTRAMUSCULAR | Status: AC
  Filled 2014-08-08: qty 1

## 2014-08-29 ENCOUNTER — Other Ambulatory Visit: Payer: Self-pay | Admitting: Internal Medicine

## 2014-08-29 DIAGNOSIS — Z1231 Encounter for screening mammogram for malignant neoplasm of breast: Secondary | ICD-10-CM

## 2014-08-30 ENCOUNTER — Ambulatory Visit (HOSPITAL_COMMUNITY)
Admission: RE | Admit: 2014-08-30 | Discharge: 2014-08-30 | Disposition: A | Discharge status: Home / Self Care | Payer: Medicare Other | Source: Ambulatory Visit | Attending: Nephrology | Admitting: Nephrology

## 2014-08-30 DIAGNOSIS — D631 Anemia in chronic kidney disease: Secondary | ICD-10-CM | POA: Insufficient documentation

## 2014-08-30 DIAGNOSIS — N185 Chronic kidney disease, stage 5: Secondary | ICD-10-CM | POA: Diagnosis not present

## 2014-08-30 LAB — POCT HEMOGLOBIN-HEMACUE: HEMOGLOBIN: 9.9 g/dL — AB (ref 12.0–15.0)

## 2014-08-30 MED ORDER — EPOETIN ALFA 20000 UNIT/ML IJ SOLN
20000.0000 [IU] | INTRAMUSCULAR | Status: DC
  Administered 2014-08-30: 20000 [IU] via SUBCUTANEOUS

## 2014-08-30 MED ORDER — EPOETIN ALFA 20000 UNIT/ML IJ SOLN
INTRAMUSCULAR | Status: AC
  Filled 2014-08-30: qty 1

## 2014-09-04 ENCOUNTER — Other Ambulatory Visit: Payer: Self-pay | Admitting: Internal Medicine

## 2014-09-07 ENCOUNTER — Encounter: Payer: Medicare Other | Admitting: Internal Medicine

## 2014-09-07 ENCOUNTER — Encounter: Payer: Self-pay | Admitting: Internal Medicine

## 2014-09-20 ENCOUNTER — Encounter (HOSPITAL_COMMUNITY)
Admission: RE | Admit: 2014-09-20 | Discharge: 2014-09-20 | Disposition: A | Discharge status: Home / Self Care | Payer: Medicare Other | Source: Ambulatory Visit | Attending: Nephrology | Admitting: Nephrology

## 2014-09-20 DIAGNOSIS — N185 Chronic kidney disease, stage 5: Secondary | ICD-10-CM | POA: Insufficient documentation

## 2014-09-20 DIAGNOSIS — D638 Anemia in other chronic diseases classified elsewhere: Secondary | ICD-10-CM | POA: Insufficient documentation

## 2014-09-20 LAB — POCT HEMOGLOBIN-HEMACUE: HEMOGLOBIN: 9.5 g/dL — AB (ref 12.0–15.0)

## 2014-09-20 MED ORDER — EPOETIN ALFA 20000 UNIT/ML IJ SOLN
20000.0000 [IU] | INTRAMUSCULAR | Status: DC
  Administered 2014-09-20: 20000 [IU] via SUBCUTANEOUS

## 2014-09-20 MED ORDER — EPOETIN ALFA 20000 UNIT/ML IJ SOLN
INTRAMUSCULAR | Status: AC
  Filled 2014-09-20: qty 1

## 2014-09-20 NOTE — Discharge Instructions (Signed)

## 2014-10-04 ENCOUNTER — Ambulatory Visit (HOSPITAL_COMMUNITY)
Admission: RE | Admit: 2014-10-04 | Discharge: 2014-10-04 | Disposition: A | Discharge status: Home / Self Care | Payer: Medicare Other | Source: Ambulatory Visit | Attending: Internal Medicine | Admitting: Internal Medicine

## 2014-10-04 DIAGNOSIS — Z1231 Encounter for screening mammogram for malignant neoplasm of breast: Secondary | ICD-10-CM

## 2014-10-05 ENCOUNTER — Other Ambulatory Visit: Payer: Self-pay | Admitting: Internal Medicine

## 2014-10-11 ENCOUNTER — Encounter (HOSPITAL_COMMUNITY)
Admission: RE | Admit: 2014-10-11 | Discharge: 2014-10-11 | Disposition: A | Discharge status: Home / Self Care | Payer: Medicare Other | Source: Ambulatory Visit | Attending: Nephrology | Admitting: Nephrology

## 2014-10-11 DIAGNOSIS — N185 Chronic kidney disease, stage 5: Secondary | ICD-10-CM | POA: Insufficient documentation

## 2014-10-11 DIAGNOSIS — D631 Anemia in chronic kidney disease: Secondary | ICD-10-CM | POA: Insufficient documentation

## 2014-10-11 LAB — POCT HEMOGLOBIN-HEMACUE: Hemoglobin: 10.5 g/dL — ABNORMAL LOW (ref 12.0–15.0)

## 2014-10-11 LAB — IRON AND TIBC
Iron: 110 ug/dL (ref 42–145)
Saturation Ratios: 41 % (ref 20–55)
TIBC: 270 ug/dL (ref 250–470)
UIBC: 160 ug/dL (ref 125–400)

## 2014-10-11 LAB — FERRITIN: FERRITIN: 117 ng/mL (ref 10–291)

## 2014-10-11 MED ORDER — EPOETIN ALFA 20000 UNIT/ML IJ SOLN
20000.0000 [IU] | INTRAMUSCULAR | Status: DC
  Administered 2014-10-11: 20000 [IU] via SUBCUTANEOUS

## 2014-10-11 MED ORDER — EPOETIN ALFA 20000 UNIT/ML IJ SOLN
INTRAMUSCULAR | Status: AC
  Filled 2014-10-11: qty 1

## 2014-10-12 ENCOUNTER — Encounter: Payer: Self-pay | Admitting: Internal Medicine

## 2014-10-12 ENCOUNTER — Ambulatory Visit (INDEPENDENT_AMBULATORY_CARE_PROVIDER_SITE_OTHER): Payer: Medicare Other | Admitting: Internal Medicine

## 2014-10-12 VITALS — BP 162/79 | HR 80 | Temp 97.5°F | Ht 63.0 in | Wt 170.2 lb

## 2014-10-12 DIAGNOSIS — I120 Hypertensive chronic kidney disease with stage 5 chronic kidney disease or end stage renal disease: Secondary | ICD-10-CM | POA: Diagnosis not present

## 2014-10-12 DIAGNOSIS — K219 Gastro-esophageal reflux disease without esophagitis: Secondary | ICD-10-CM | POA: Diagnosis not present

## 2014-10-12 DIAGNOSIS — M170 Bilateral primary osteoarthritis of knee: Secondary | ICD-10-CM

## 2014-10-12 DIAGNOSIS — H409 Unspecified glaucoma: Secondary | ICD-10-CM

## 2014-10-12 DIAGNOSIS — M1710 Unilateral primary osteoarthritis, unspecified knee: Secondary | ICD-10-CM

## 2014-10-12 DIAGNOSIS — Z23 Encounter for immunization: Secondary | ICD-10-CM | POA: Diagnosis not present

## 2014-10-12 DIAGNOSIS — N185 Chronic kidney disease, stage 5: Secondary | ICD-10-CM | POA: Diagnosis not present

## 2014-10-12 DIAGNOSIS — I10 Essential (primary) hypertension: Secondary | ICD-10-CM

## 2014-10-12 DIAGNOSIS — Z0000 Encounter for general adult medical examination without abnormal findings: Secondary | ICD-10-CM

## 2014-10-12 DIAGNOSIS — IMO0002 Reserved for concepts with insufficient information to code with codable children: Secondary | ICD-10-CM

## 2014-10-12 NOTE — Assessment & Plan Note (Signed)
She follows with renal.  Her most recent BUN/Cr from 08/2014 was 85/5.53 with a GFR of 7, K was 3.9.  She notes that her nephrologist told her that her kidney function was "stable."  She will continue following with them.  Continue calcitriol, lasix and procrit.  No signs/symptoms of volume overload or uremia today.

## 2014-10-12 NOTE — Assessment & Plan Note (Signed)
She follows with eye doctor.  No issues taking her eye drops today.

## 2014-10-12 NOTE — Patient Instructions (Signed)
General Instructions: Please schedule a follow up visit 4within the next 4 months.  You have an appointment at 345pm on Friday April 8 this week for knee injections with Dr. Redmond Pulling.   For your medications:   Please bring all of your pill  Bottles with you to each visit.  This will help make sure that we have an up to date list of all the medications you are taking.  Please also bring any over the counter herbal medications you are taking (not including advil, tylenol, etc.)  Please continue taking all of your medications as prescribed  If you have any further bleeding from down below, please call the clinic as soon as possible to be evaluated.  You will get the prevnar 13 pneumonia shot today, please see below for more information.    Please continue to follow up with your kidney doctor.    Thank you!   Treatment Goals:  Goals (1 Years of Data) as of 10/12/14          As of Today 10/11/14 09/20/14 08/30/14 08/08/14     Blood Pressure   . Blood Pressure < 160/90  162/79 148/67 151/75 162/72 149/76     Result Component   . LDL CALC < 100            Progress Toward Treatment Goals:  Treatment Goal 10/12/2014  Blood pressure at goal    Self Care Goals & Plans:  Self Care Goal 10/12/2014  Manage my medications take my medicines as prescribed; bring my medications to every visit; refill my medications on time  Monitor my health -  Eat healthy foods eat more vegetables; eat foods that are low in salt; eat baked foods instead of fried foods  Be physically active find an activity I enjoy; take a walk every day    No flowsheet data found.   Care Management & Community Referrals:  Referral 09/08/2013  Referrals made for care management support none needed     Pneumococcal Vaccine, Polyvalent suspension for injection What is this medicine? PNEUMOCOCCAL VACCINE, POLYVALENT (NEU mo KOK al vak SEEN, pol ee VEY luhnt) is a vaccine to prevent pneumococcus bacteria infection. These bacteria  are a major cause of ear infections, 'Strep throat' infections, and serious pneumonia, meningitis, or blood infections worldwide. These vaccines help the body to produce antibodies (protective substances) that help your body defend against these bacteria. This vaccine is recommended for infants and young children. This vaccine will not treat an infection. This medicine may be used for other purposes; ask your health care provider or pharmacist if you have questions. COMMON BRAND NAME(S): Prevnar 13 What should I tell my health care provider before I take this medicine? They need to know if you have any of these conditions: -bleeding problems -fever -immune system problems -low platelet count in the blood -seizures -an unusual or allergic reaction to pneumococcal vaccine, diphtheria toxoid, other vaccines, latex, other medicines, foods, dyes, or preservatives -pregnant or trying to get pregnant -breast-feeding How should I use this medicine? This vaccine is for injection into a muscle. It is given by a health care professional. A copy of Vaccine Information Statements will be given before each vaccination. Read this sheet carefully each time. The sheet may change frequently. Talk to your pediatrician regarding the use of this medicine in children. While this drug may be prescribed for children as young as 77 weeks old for selected conditions, precautions do apply. Overdosage: If you think you have taken  too much of this medicine contact a poison control center or emergency room at once. NOTE: This medicine is only for you. Do not share this medicine with others. What if I miss a dose? It is important not to miss your dose. Call your doctor or health care professional if you are unable to keep an appointment. What may interact with this medicine? -medicines for cancer chemotherapy -medicines that suppress your immune function -medicines that treat or prevent blood clots like warfarin,  enoxaparin, and dalteparin -steroid medicines like prednisone or cortisone This list may not describe all possible interactions. Give your health care provider a list of all the medicines, herbs, non-prescription drugs, or dietary supplements you use. Also tell them if you smoke, drink alcohol, or use illegal drugs. Some items may interact with your medicine. What should I watch for while using this medicine? Mild fever and pain should go away in 3 days or less. Report any unusual symptoms to your doctor or health care professional. What side effects may I notice from receiving this medicine? Side effects that you should report to your doctor or health care professional as soon as possible: -allergic reactions like skin rash, itching or hives, swelling of the face, lips, or tongue -breathing problems -confused -fever over 102 degrees F -pain, tingling, numbness in the hands or feet -seizures -unusual bleeding or bruising -unusual muscle weakness Side effects that usually do not require medical attention (report to your doctor or health care professional if they continue or are bothersome): -aches and pains -diarrhea -fever of 102 degrees F or less -headache -irritable -loss of appetite -pain, tender at site where injected -trouble sleeping This list may not describe all possible side effects. Call your doctor for medical advice about side effects. You may report side effects to FDA at 1-800-FDA-1088. Where should I keep my medicine? This does not apply. This vaccine is given in a clinic, pharmacy, doctor's office, or other health care setting and will not be stored at home. NOTE: This sheet is a summary. It may not cover all possible information. If you have questions about this medicine, talk to your doctor, pharmacist, or health care provider.  2015, [employer]vier/Gold Standard. (2008-09-06 10:17:22)

## 2014-10-12 NOTE — Progress Notes (Signed)
Subjective:    Patient ID: Nancy [name], female    DOB: 1941-01-03, 74 y.o.   MRN: [id number]  Routine follow up for HTN  HPI  Nancy [name] is a 74yo woman with PMH of CKD, HTN, GERD, glaucome, HLD, OA who presents for follow up of her blood pressure.   Nancy [name] reports that both of her knees are acting up today.  Her right knee is the worst and this is the knee she usually has problems with.  She has had a steroid injection in that knee and reports that the pain subsided for over a year.  She would like to try this again. Her main symptom is pain and feeling like her knees are "loose".  No falls, no lost balance.    She is taking tylenol with only some relief.  She has tried tramadol in the past but did not note that it helped her very much.  No swelling or redness.     She is concerned about a dexa scan, she had a health nurse from her insurance come out and tell her that she needed one.  She had a normal scan back in 2008.  She does have CKD stage 5 and secondary hyperparathyroidism from that disease which puts her at risk for bone thinning and fracture.  DEXA scanning is not the best at determining this risk and may be falsely reassuring.  She is not a candidate for bisphosphonate therapy given her GFR.     ? Vaginal bleeding in November.  She reports it was spontaneous, with clots and resolved on its own.  She underwent menopause "many years ago."  She has a reported history of fibroids.  I also reviewed her chart and there is a note of a total abdominal hysterectomy, she has no memory of this, but does report she had "some" surgery and they didn't tell her what they did.  I advised her that if she has any further symptoms, to call the clinic immediately for evaluation.  She has never had an abnormal pap smear.  I am not sure what this could have been, but as it resolved, she likely does not have a uterus, and she has no new risk factors for cervical cancer, will have her monitor for any new  symptoms at this time.   She has a history of HTN, BP initially was 162/79, recheck was 132/77 manually by me.  She is on amlodipine and coreg without issues.  She has glaucoma and follows with an eye doctor.  She has a reported history of LVH, reviewed her most recent TTE which showed only mild LVH, mild AS, no systolic or diastolic dysfunction.  I did not hear a murmur on exam today.    Current outpatient prescriptions:  .  acetaminophen (TYLENOL) 500 MG tablet, Take 500 mg by mouth every 6 (six) hours as needed for mild pain, moderate pain, fever or headache. Marland Kitchen  amLODipine (NORVASC) 10 MG tablet, take 1 tablet by mouth once daily .  calcitRIOL (ROCALTROL) 0.25 MCG capsule, Take 0.25 mcg by mouth daily .  carvedilol (COREG) 12.5 MG tablet, Take 12.5 mg by mouth 2 (two) times daily with a meal.,  .  COMBIGAN 0.2-0.5 % ophthalmic solution, .  famotidine (PEPCID) 40 MG tablet, take 1 tablet by mouth once daily,  .  furosemide (LASIX) 40 MG tablet, take 1 tablet by mouth once daily .  Travoprost, BAK Free, (TRAVATAN) 0.004 % SOLN ophthalmic solution, Place 1-2 drops  into both eyes 2 (two) times daily. 1 drops in am, 2 drop in pm  Review of Systems  Constitutional: Negative for fever, chills and unexpected weight change.  HENT: Negative for ear discharge and ear pain.   Eyes: Negative for photophobia and visual disturbance.  Respiratory: Negative for cough and shortness of breath.   Cardiovascular: Negative for palpitations and leg swelling.  Gastrointestinal: Negative for nausea, vomiting, abdominal pain and blood in stool.  Genitourinary: Negative for decreased urine volume, enuresis, difficulty urinating and dyspareunia.  Musculoskeletal: Positive for arthralgias (knee pain bilaterall). Negative for back pain and gait problem.  Skin: Negative for rash and wound.  Neurological: Negative for dizziness, weakness and light-headedness.  Psychiatric/Behavioral: Negative for confusion and  decreased concentration.       Objective:   Physical Exam  Constitutional: She is oriented to person, place, and time. She appears well-developed and well-nourished. No distress.  HENT:  Head: Normocephalic and atraumatic.  Eyes: Conjunctivae are normal. No scleral icterus.  Neck: Neck supple.  Cardiovascular: Normal rate, regular rhythm and normal heart sounds.   No murmur heard. Pulmonary/Chest: Effort normal and breath sounds normal. No stridor. No respiratory distress. She has no wheezes.  Abdominal: Bowel sounds are normal.  Musculoskeletal: She exhibits tenderness (to knees bilaterally, + crepitus bilaterally). She exhibits no edema.  Lymphadenopathy:    She has no cervical adenopathy.  Neurological: She is alert and oriented to person, place, and time. No cranial nerve deficit.  Skin: Skin is warm and dry. No rash noted. No erythema.  Psychiatric: She has a normal mood and affect. Her behavior is normal.   No labs today  Prevnar 13 today      Assessment & Plan:  RTC in 4 months, this week for knee injection

## 2014-10-12 NOTE — Assessment & Plan Note (Signed)
BP Readings from Last 3 Encounters:  10/12/14 162/79  10/11/14 148/67  09/20/14 151/75  Repeat today was 132/77  Lab Results  Component Value Date   NA 141 02/09/2014   K 3.8 02/09/2014   CREATININE 4.64* 02/09/2014    Assessment: Blood pressure control: controlled Progress toward BP goal:  at goal Comments: repeat was okay  Plan: Medications:  continue current medications, amlodipine, coreg Educational resources provided:   Self management tools provided:   Other plans: Renal function as noted, reported stable by patient.  Following with nephrology

## 2014-10-12 NOTE — Assessment & Plan Note (Signed)
Scheduled for clinic appointment on Friday for knee injections.

## 2014-10-12 NOTE — Assessment & Plan Note (Signed)
Continue pepcid, no symptoms today.

## 2014-10-12 NOTE — Assessment & Plan Note (Signed)
Dexa: 2008 - normal, no need to retest per regular guidelines, however, there are mixed recommendations in the literature for people with bone disease related to CKD.  Will discuss with patient at next visit and likely schedule a repeat test.  However, it is unclear if the test would change our treatment at all.  Possibly she could qualify for teriparatide.  Consider discussing with renal as well.   MMG: 2016 - negative PAP: 2005, 2008 normal.  Reports never having an abnormal, discussed today, can stop screening.  Colon: 2015, normal.  No further testing per Dr. Benson Norway  Flu: Done.  Pneumovax: 09/17/11, Prevnar today

## 2014-10-14 ENCOUNTER — Ambulatory Visit (INDEPENDENT_AMBULATORY_CARE_PROVIDER_SITE_OTHER): Payer: Medicare Other | Admitting: Internal Medicine

## 2014-10-14 ENCOUNTER — Encounter: Payer: Self-pay | Admitting: Internal Medicine

## 2014-10-14 VITALS — BP 157/70 | HR 86 | Temp 98.1°F | Ht 63.0 in | Wt 173.1 lb

## 2014-10-14 DIAGNOSIS — IMO0002 Reserved for concepts with insufficient information to code with codable children: Secondary | ICD-10-CM

## 2014-10-14 DIAGNOSIS — M179 Osteoarthritis of knee, unspecified: Secondary | ICD-10-CM

## 2014-10-14 DIAGNOSIS — M1710 Unilateral primary osteoarthritis, unspecified knee: Secondary | ICD-10-CM

## 2014-10-14 NOTE — Progress Notes (Signed)
   Subjective:    Patient ID: Nancy [name], female    DOB: March 06, 1941, 74 y.o.   MRN: [id number]  HPI Comments: Nancy [name] is a 74 year old woman with PMH as below here for steroid injection to her knee.  She was just seen by her PCP two days ago and this visit was limited to this procedure.  Please see problems based charting for details.     Past Medical History  Diagnosis Date  . CVA (cerebrovascular accident)     New hemorrhagic per CT scan '09  . Fecal impaction   . Diverticulosis of colon   . Hypertension   . Pulmonary nodule   . Dysfunctional uterine bleeding   . Postmenopausal   . OA (osteoarthritis)     bilateral knees  . Colitis   . Bacterial sinusitis 09/17/2011  . TINEA CRURIS 01/12/2007  . HERNIORRHAPHY, HX OF 08/11/2006  . CKD (chronic kidney disease) stage 4, GFR 15-29 ml/min 08/11/2006    Cr continues to increase. Proteinuria on UA 02/10/12.    Marland Kitchen Headache(784.0)     Review of Systems  Musculoskeletal: Positive for arthralgias.       Patient with hx of OA causing B/L knee pain (R > L).       Filed Vitals:   10/14/14 1602  BP: 157/70  Pulse: 86  Temp: 98.1 F (36.7 C)  TempSrc: Oral  Height: 5\' 3"  (1.6 m)  Weight: 173 lb 1.6 oz (78.518 kg)  SpO2: 100%   Objective:   Physical Exam  Constitutional: She is oriented to person, place, and time. She appears well-developed. No distress.  HENT:  Head: Normocephalic and atraumatic.  Neck: Neck supple.  Musculoskeletal: She exhibits edema and tenderness.  Knees TTP and mild R knee swelling.   Neurological: She is alert and oriented to person, place, and time.  Skin: Skin is warm. She is not diaphoretic.  Psychiatric: She has a normal mood and affect. Her behavior is normal. Judgment and thought content normal.  Vitals reviewed.         Assessment & Plan:  Please see problem based assessment and plan.

## 2014-10-14 NOTE — Assessment & Plan Note (Addendum)
Procedure:  Steroid injections to right and left knee.   Performed by:  Duwaine Maxin, DO  Supervising Attending:  Oval Linsey, MD  The risk and benefits were explained to the patient and she would like to get the injections.  She has had this procedure before and had significant relief.  Appropriate time out was observed and both knees were marked prior to the procedure.  Sterile technique was observed.  The skin was sterilized with povidone-iodine, cold spray was used for numbing, and 1.5 cc of steroid combined with local anesthetic was injected into the right knee and then the left knee.  Injection sites were then wiped and covered with bandages.  The patient tolerated the procedure and noticed improvement soon after the injections.  There were no immediate complications.

## 2014-10-15 NOTE — Progress Notes (Signed)
I saw and evaluated the patient.  I personally confirmed the key portions of Dr. Dois Davenport history and exam and reviewed pertinent patient test results.  The assessment, diagnosis, and plan were formulated together and I agree with the documentation in the resident's note.  I was present at the bedside for the entire procedure.  Forty mg of kenalog and 0.5 cc of 2% lidocaine were injected into each knee.  The patient tolerated the procedure very well without any immediate complications.

## 2014-10-15 NOTE — Addendum Note (Signed)
Addended by: Oval Linsey D on: 10/15/2014 07:27 AM   Modules accepted: Level of Service

## 2014-10-25 ENCOUNTER — Encounter (HOSPITAL_COMMUNITY)
Admission: RE | Admit: 2014-10-25 | Discharge: 2014-10-25 | Disposition: A | Discharge status: Home / Self Care | Payer: Medicare Other | Source: Ambulatory Visit | Attending: Nephrology | Admitting: Nephrology

## 2014-10-25 DIAGNOSIS — N185 Chronic kidney disease, stage 5: Secondary | ICD-10-CM | POA: Diagnosis not present

## 2014-10-25 LAB — POCT HEMOGLOBIN-HEMACUE: HEMOGLOBIN: 10.6 g/dL — AB (ref 12.0–15.0)

## 2014-10-25 MED ORDER — EPOETIN ALFA 20000 UNIT/ML IJ SOLN
INTRAMUSCULAR | Status: AC
Start: 2014-10-25 — End: 2014-10-25
  Filled 2014-10-25: qty 1

## 2014-10-25 MED ORDER — EPOETIN ALFA 20000 UNIT/ML IJ SOLN
20000.0000 [IU] | INTRAMUSCULAR | Status: DC
  Administered 2014-10-25: 20000 [IU] via SUBCUTANEOUS

## 2014-11-04 ENCOUNTER — Other Ambulatory Visit: Payer: Self-pay | Admitting: Internal Medicine

## 2014-11-08 ENCOUNTER — Encounter (HOSPITAL_COMMUNITY): Payer: Medicare Other

## 2014-11-09 ENCOUNTER — Encounter (HOSPITAL_COMMUNITY)
Admission: RE | Admit: 2014-11-09 | Discharge: 2014-11-09 | Disposition: A | Discharge status: Home / Self Care | Payer: Medicare Other | Source: Ambulatory Visit | Attending: Nephrology | Admitting: Nephrology

## 2014-11-09 DIAGNOSIS — N185 Chronic kidney disease, stage 5: Secondary | ICD-10-CM | POA: Diagnosis not present

## 2014-11-09 DIAGNOSIS — D631 Anemia in chronic kidney disease: Secondary | ICD-10-CM | POA: Diagnosis present

## 2014-11-09 LAB — POCT HEMOGLOBIN-HEMACUE: HEMOGLOBIN: 11.6 g/dL — AB (ref 12.0–15.0)

## 2014-11-09 MED ORDER — EPOETIN ALFA 20000 UNIT/ML IJ SOLN
INTRAMUSCULAR | Status: AC
  Filled 2014-11-09: qty 1

## 2014-11-09 MED ORDER — EPOETIN ALFA 20000 UNIT/ML IJ SOLN
20000.0000 [IU] | INTRAMUSCULAR | Status: DC
  Administered 2014-11-09: 20000 [IU] via SUBCUTANEOUS

## 2014-11-16 ENCOUNTER — Ambulatory Visit: Payer: Medicare Other | Admitting: Internal Medicine

## 2014-11-17 ENCOUNTER — Observation Stay (HOSPITAL_COMMUNITY)
Admission: EM | Admit: 2014-11-17 | Discharge: 2014-11-18 | Disposition: A | Discharge status: Home / Self Care | Payer: Medicare Other | Attending: Internal Medicine | Admitting: Internal Medicine

## 2014-11-17 ENCOUNTER — Encounter (HOSPITAL_COMMUNITY): Payer: Self-pay

## 2014-11-17 ENCOUNTER — Emergency Department (HOSPITAL_COMMUNITY): Payer: Medicare Other

## 2014-11-17 ENCOUNTER — Telehealth: Payer: Self-pay | Admitting: *Deleted

## 2014-11-17 DIAGNOSIS — I129 Hypertensive chronic kidney disease with stage 1 through stage 4 chronic kidney disease, or unspecified chronic kidney disease: Secondary | ICD-10-CM | POA: Diagnosis not present

## 2014-11-17 DIAGNOSIS — Y929 Unspecified place or not applicable: Secondary | ICD-10-CM | POA: Insufficient documentation

## 2014-11-17 DIAGNOSIS — Z8742 Personal history of other diseases of the female genital tract: Secondary | ICD-10-CM | POA: Diagnosis not present

## 2014-11-17 DIAGNOSIS — Y999 Unspecified external cause status: Secondary | ICD-10-CM | POA: Diagnosis not present

## 2014-11-17 DIAGNOSIS — Z79899 Other long term (current) drug therapy: Secondary | ICD-10-CM | POA: Insufficient documentation

## 2014-11-17 DIAGNOSIS — Z8619 Personal history of other infectious and parasitic diseases: Secondary | ICD-10-CM | POA: Insufficient documentation

## 2014-11-17 DIAGNOSIS — Y939 Activity, unspecified: Secondary | ICD-10-CM | POA: Insufficient documentation

## 2014-11-17 DIAGNOSIS — T25222A Burn of second degree of left foot, initial encounter: Secondary | ICD-10-CM | POA: Diagnosis not present

## 2014-11-17 DIAGNOSIS — X12XXXA Contact with other hot fluids, initial encounter: Secondary | ICD-10-CM | POA: Diagnosis not present

## 2014-11-17 DIAGNOSIS — Z8719 Personal history of other diseases of the digestive system: Secondary | ICD-10-CM | POA: Diagnosis not present

## 2014-11-17 DIAGNOSIS — Z8673 Personal history of transient ischemic attack (TIA), and cerebral infarction without residual deficits: Secondary | ICD-10-CM | POA: Diagnosis not present

## 2014-11-17 DIAGNOSIS — N184 Chronic kidney disease, stage 4 (severe): Secondary | ICD-10-CM | POA: Insufficient documentation

## 2014-11-17 DIAGNOSIS — R42 Dizziness and giddiness: Secondary | ICD-10-CM | POA: Diagnosis not present

## 2014-11-17 DIAGNOSIS — R079 Chest pain, unspecified: Secondary | ICD-10-CM | POA: Diagnosis present

## 2014-11-17 DIAGNOSIS — M170 Bilateral primary osteoarthritis of knee: Secondary | ICD-10-CM | POA: Insufficient documentation

## 2014-11-17 LAB — COMPREHENSIVE METABOLIC PANEL
ALBUMIN: 3.4 g/dL — AB (ref 3.5–5.0)
ALK PHOS: 65 U/L (ref 38–126)
ALT: <5 U/L — ABNORMAL LOW (ref 14–54)
ANION GAP: 11 (ref 5–15)
AST: 10 U/L — ABNORMAL LOW (ref 15–41)
BILIRUBIN TOTAL: 0.3 mg/dL (ref 0.3–1.2)
BUN: 64 mg/dL — ABNORMAL HIGH (ref 6–20)
CO2: 18 mmol/L — ABNORMAL LOW (ref 22–32)
CREATININE: 6.03 mg/dL — AB (ref 0.44–1.00)
Calcium: 8.2 mg/dL — ABNORMAL LOW (ref 8.9–10.3)
Chloride: 108 mmol/L (ref 101–111)
GFR, EST AFRICAN AMERICAN: 7 mL/min — AB (ref >60)
GFR, EST NON AFRICAN AMERICAN: 6 mL/min — AB (ref >60)
Glucose, Bld: 111 mg/dL — ABNORMAL HIGH (ref 65–99)
Potassium: 3.6 mmol/L (ref 3.5–5.1)
Sodium: 137 mmol/L (ref 135–145)
Total Protein: 6.5 g/dL (ref 6.5–8.1)

## 2014-11-17 LAB — CBC
HEMATOCRIT: 33.2 % — AB (ref 36.0–46.0)
HEMOGLOBIN: 10.4 g/dL — AB (ref 12.0–15.0)
MCH: 27.9 pg (ref 26.0–34.0)
MCHC: 31.3 g/dL (ref 30.0–36.0)
MCV: 89 fL (ref 78.0–100.0)
Platelets: 240 10*3/uL (ref 150–400)
RBC: 3.73 MIL/uL — AB (ref 3.87–5.11)
RDW: 15.9 % — AB (ref 11.5–15.5)
WBC: 4 10*3/uL (ref 4.0–10.5)

## 2014-11-17 LAB — BRAIN NATRIURETIC PEPTIDE: B Natriuretic Peptide: 59.3 pg/mL (ref 0.0–100.0)

## 2014-11-17 LAB — LIPASE, BLOOD: LIPASE: 49 U/L (ref 22–51)

## 2014-11-17 LAB — RAPID URINE DRUG SCREEN, HOSP PERFORMED
Amphetamines: NOT DETECTED
BARBITURATES: NOT DETECTED
BENZODIAZEPINES: NOT DETECTED
COCAINE: NOT DETECTED
OPIATES: NOT DETECTED
TETRAHYDROCANNABINOL: NOT DETECTED

## 2014-11-17 LAB — TROPONIN I

## 2014-11-17 LAB — I-STAT TROPONIN, ED: Troponin i, poc: 0 ng/mL (ref 0.00–0.08)

## 2014-11-17 MED ORDER — HEPARIN SODIUM (PORCINE) 5000 UNIT/ML IJ SOLN
5000.0000 [IU] | Freq: Three times a day (TID) | INTRAMUSCULAR | Status: DC
  Administered 2014-11-17 – 2014-11-18 (×3): 5000 [IU] via SUBCUTANEOUS
  Filled 2014-11-17 (×3): qty 1

## 2014-11-17 MED ORDER — BRIMONIDINE TARTRATE-TIMOLOL 0.2-0.5 % OP SOLN: 1.0000 [drp] | Freq: Two times a day (BID) | OPHTHALMIC | Status: DC

## 2014-11-17 MED ORDER — CARVEDILOL 12.5 MG PO TABS
12.5000 mg | ORAL_TABLET | Freq: Two times a day (BID) | ORAL | Status: DC
  Administered 2014-11-18: 12.5 mg via ORAL
  Filled 2014-11-17: qty 1

## 2014-11-17 MED ORDER — BRIMONIDINE TARTRATE 0.2 % OP SOLN
1.0000 [drp] | Freq: Two times a day (BID) | OPHTHALMIC | Status: DC
  Administered 2014-11-17 – 2014-11-18 (×2): 1 [drp] via OPHTHALMIC
  Filled 2014-11-17: qty 5

## 2014-11-17 MED ORDER — ONDANSETRON HCL 4 MG/2ML IJ SOLN: 4.0000 mg | Freq: Four times a day (QID) | INTRAMUSCULAR | Status: DC | PRN

## 2014-11-17 MED ORDER — FAMOTIDINE 20 MG PO TABS
40.0000 mg | ORAL_TABLET | Freq: Every day | ORAL | Status: DC
  Administered 2014-11-18: 40 mg via ORAL
  Filled 2014-11-17 (×2): qty 2

## 2014-11-17 MED ORDER — AMLODIPINE BESYLATE 10 MG PO TABS
10.0000 mg | ORAL_TABLET | Freq: Every day | ORAL | Status: DC
  Administered 2014-11-18: 10 mg via ORAL
  Filled 2014-11-17: qty 1

## 2014-11-17 MED ORDER — LATANOPROST 0.005 % OP SOLN
1.0000 [drp] | Freq: Every day | OPHTHALMIC | Status: DC
  Administered 2014-11-17: 1 [drp] via OPHTHALMIC
  Filled 2014-11-17: qty 2.5

## 2014-11-17 MED ORDER — ACETAMINOPHEN 325 MG PO TABS
650.0000 mg | ORAL_TABLET | Freq: Four times a day (QID) | ORAL | Status: DC | PRN
  Administered 2014-11-17 – 2014-11-18 (×2): 650 mg via ORAL
  Filled 2014-11-17 (×2): qty 2

## 2014-11-17 MED ORDER — ONDANSETRON HCL 4 MG PO TABS: 4.0000 mg | ORAL_TABLET | Freq: Four times a day (QID) | ORAL | Status: DC | PRN

## 2014-11-17 MED ORDER — ACETAMINOPHEN 650 MG RE SUPP: 650.0000 mg | Freq: Four times a day (QID) | RECTAL | Status: DC | PRN

## 2014-11-17 MED ORDER — FUROSEMIDE 40 MG PO TABS
40.0000 mg | ORAL_TABLET | Freq: Every day | ORAL | Status: DC
  Administered 2014-11-18: 40 mg via ORAL
  Filled 2014-11-17: qty 1

## 2014-11-17 MED ORDER — TIMOLOL MALEATE 0.5 % OP SOLN
1.0000 [drp] | Freq: Two times a day (BID) | OPHTHALMIC | Status: DC
  Administered 2014-11-17 – 2014-11-18 (×2): 1 [drp] via OPHTHALMIC
  Filled 2014-11-17: qty 5

## 2014-11-17 MED ORDER — SILVER SULFADIAZINE 1 % EX CREA
TOPICAL_CREAM | Freq: Once | CUTANEOUS | Status: AC
  Administered 2014-11-17: 11:00:00 via TOPICAL
  Filled 2014-11-17: qty 85

## 2014-11-17 MED ORDER — GI COCKTAIL ~~LOC~~: 30.0000 mL | Freq: Two times a day (BID) | ORAL | Status: DC | PRN

## 2014-11-17 MED ORDER — NOREPINEPHRINE BITARTRATE 1 MG/ML IV SOLN: 2.0000 ug/min | INTRAVENOUS | Status: DC

## 2014-11-17 MED ORDER — NITROGLYCERIN 0.4 MG SL SUBL: 0.4000 mg | SUBLINGUAL_TABLET | SUBLINGUAL | Status: DC | PRN

## 2014-11-17 MED ORDER — SODIUM CHLORIDE 0.9 % IJ SOLN
3.0000 mL | Freq: Two times a day (BID) | INTRAMUSCULAR | Status: DC
  Administered 2014-11-17 – 2014-11-18 (×3): 3 mL via INTRAVENOUS

## 2014-11-17 NOTE — ED Notes (Signed)
Called for 3W admit for Room 8 for 1:35pm

## 2014-11-17 NOTE — ED Notes (Signed)
Called to cancel 3W appt. Time per RN Hayley.

## 2014-11-17 NOTE — ED Notes (Signed)
Called to reschedule appt. Time for 3W -per RN Erin. 3W is ready.

## 2014-11-17 NOTE — Telephone Encounter (Signed)
Husband called - checks wife BP every morning - today 85/44. When I asked him about how she felt - he handed the phone to pt. Pt states she has had tightness in chest since yesterday 11/16/14 with no relief. Suggest for pt to call 911 to bring her to ER - husband on home O2 cont. Hilda Blades Audrey Thull RN 11/17/14 10AM

## 2014-11-17 NOTE — ED Provider Notes (Signed)
CSN: [id number]     Arrival date & time    History   First MD Initiated Contact with Patient 11/17/14 1043     Chief Complaint  Patient presents with  . Chest Pain     (Consider location/radiation/quality/duration/timing/severity/associated sxs/prior Treatment) HPI Comments: Husband checked BP this morning and it was low, 85/44. CP since burning her foot a few days ago.  Patient is a 74 y.o. female presenting with chest pain. The history is provided by the patient.  Chest Pain Pain location:  Substernal area Pain quality: sharp and tightness   Pain radiates to:  Does not radiate Pain severity:  Mild Onset quality:  Gradual Timing:  Constant Progression:  Resolved (Gone at this time, was constant for past few days) Chronicity:  New Context: at rest   Relieved by: movement. Associated symptoms: dizziness   Associated symptoms: no cough, no fever, no nausea, no shortness of breath and not vomiting     Past Medical History  Diagnosis Date  . CVA (cerebrovascular accident)     New hemorrhagic per CT scan '09  . Fecal impaction   . Diverticulosis of colon   . Hypertension   . Pulmonary nodule   . Dysfunctional uterine bleeding   . Postmenopausal   . OA (osteoarthritis)     bilateral knees  . Colitis   . Bacterial sinusitis 09/17/2011  . TINEA CRURIS 01/12/2007  . HERNIORRHAPHY, HX OF 08/11/2006  . CKD (chronic kidney disease) stage 4, GFR 15-29 ml/min 08/11/2006    Cr continues to increase. Proteinuria on UA 02/10/12.    Marland Kitchen [id number])    Past Surgical History  Procedure Laterality Date  . Inguinal hernia repair  2008  . Abdominal hysterectomy    . Cholecystectomy  2009  . Iridotomy / iridectomy      Laser, right eye 12/26/11 left eye 01/24/12  . Mass excision Left 05/07/2013    Procedure: EXCISION CYST;  Surgeon: Myrtha Mantis., MD;  Location: [facility];  Service: Ophthalmology;  Laterality: Left;   Family History  Problem Relation Age of Onset   . Hypertension Mother   . Heart attack Mother   . Heart disease Mother    History  Substance Use Topics  . Smoking status: Never Smoker   . Smokeless tobacco: Never Used  . Alcohol Use: No   OB History    No data available     Review of Systems  Constitutional: Negative for fever and chills.  Respiratory: Negative for cough and shortness of breath.   Cardiovascular: Positive for chest pain.  Gastrointestinal: Negative for nausea and vomiting.  Neurological: Positive for dizziness.  All other systems reviewed and are negative.     Allergies  Hydrocodone-acetaminophen  Home Medications   Prior to Admission medications   Medication Sig Start Date End Date Taking? Authorizing Provider  acetaminophen (TYLENOL) 500 MG tablet Take 500 mg by mouth every 6 (six) hours as needed for mild pain, moderate pain, fever or headache.    Historical Provider, MD  amLODipine (NORVASC) 10 MG tablet take 1 tablet by mouth once daily 10/05/14   Sid Falcon, MD  calcitRIOL (ROCALTROL) 0.25 MCG capsule Take 0.25 mcg by mouth daily.    Historical Provider, MD  carvedilol (COREG) 12.5 MG tablet Take 12.5 mg by mouth 2 (two) times daily with a meal.    Historical Provider, MD  COMBIGAN 0.2-0.5 % ophthalmic solution  05/06/14   Historical Provider, MD  docusate sodium  100 MG CAPS Take 100 mg by mouth daily. Patient not taking: Reported on 10/12/2014 08/27/13   Corky Sox, MD  famotidine (PEPCID) 40 MG tablet take 1 tablet by mouth once daily 05/03/14   Sid Falcon, MD  furosemide (LASIX) 40 MG tablet take 1 tablet by mouth once daily 11/07/14   Sid Falcon, MD  polyethylene glycol (MIRALAX / GLYCOLAX) packet Take 17 g by mouth daily. Patient not taking: Reported on 10/12/2014 08/27/13   Corky Sox, MD  Travoprost, BAK Free, (TRAVATAN) 0.004 % SOLN ophthalmic solution Place 1-2 drops into both eyes 2 (two) times daily. 1 drops in am, 2 drop in pm    Historical Provider, MD   BP 129/59 mmHg   Pulse 66  Temp(Src) 98.4 F (36.9 C) (Oral)  Resp 15  Ht 5\' 3"  (1.6 m)  Wt 175 lb (79.379 kg)  BMI 31.01 kg/m2  SpO2 100% Physical Exam  Constitutional: She is oriented to person, place, and time. She appears well-developed and well-nourished. No distress.  HENT:  Head: Normocephalic and atraumatic.  Mouth/Throat: Oropharynx is clear and moist.  Eyes: EOM are normal. Pupils are equal, round, and reactive to light.  Neck: Normal range of motion. Neck supple.  Cardiovascular: Normal rate and regular rhythm.  Exam reveals no friction rub.   No murmur heard. Pulmonary/Chest: Effort normal and breath sounds normal. No respiratory distress. She has no wheezes. She has no rales.  Abdominal: Soft. She exhibits no distension. There is no tenderness. There is no rebound.  Musculoskeletal: Normal range of motion. She exhibits no edema.       Feet:  Neurological: She is alert and oriented to person, place, and time.  Skin: Skin is warm. She is not diaphoretic.  Nursing note and vitals reviewed.   ED Course  Procedures (including critical care time) Labs Review Labs Reviewed  CBC - Abnormal; Notable for the following:    RBC 3.73 (*)    Hemoglobin 10.4 (*)    HCT 33.2 (*)    RDW 15.9 (*)    All other components within normal limits  COMPREHENSIVE METABOLIC PANEL - Abnormal; Notable for the following:    CO2 18 (*)    Glucose, Bld 111 (*)    BUN 64 (*)    Creatinine, Ser 6.03 (*)    Calcium 8.2 (*)    Albumin 3.4 (*)    AST 10 (*)    ALT <5 (*)    GFR calc non Af Amer 6 (*)    GFR calc Af Amer 7 (*)    All other components within normal limits  BRAIN NATRIURETIC PEPTIDE  LIPASE, BLOOD  URINE RAPID DRUG SCREEN (HOSP PERFORMED)  TROPONIN I  TROPONIN I  I-STAT TROPOININ, ED    Imaging Review Dg Chest 2 View  11/17/2014   CLINICAL DATA:  Chest pain for 2 weeks  EXAM: CHEST  2 VIEW  COMPARISON:  Chest x-ray of 07/20/2013  FINDINGS: No focal infiltrate or effusion is seen. Mild  peribronchial thickening is noted which may indicate bronchitis. Mediastinal and hilar contours are unremarkable. The heart is mildly enlarged. No bony abnormality is seen.  IMPRESSION: No active lung disease. Peribronchial thickening may indicate bronchitis.   Electronically Signed   By: Ivar Drape M.D.   On: 11/17/2014 12:07     EKG Interpretation   Date/Time:  Thursday Nov 17 2014 10:44:58 EDT Ventricular Rate:  70 PR Interval:  160 QRS Duration: 110 QT  Interval:  393 QTC Calculation: 424 R Axis:   32 Text Interpretation:  Sinus rhythm Borderline repolarization abnormality  Minimal ST elevation, anterior leads No significant change since last  tracing Confirmed by Mingo Amber  MD, Jaquawn Saffran ([id number]) on 11/17/2014 10:53:02 AM      MDM   Final diagnoses:  Chest pain  Foot burn  26F here with chest pain and a foot burn. She burned her L foot last week after pulling a ham out of the oven and the brown sugar glaze fell onto her foot. She reports chest pain since then. Central, choking sensation, wrapping around her entire chest. Gone now. She did receive aspirin en route. Husband called EMS b/c her BP was low at home.  She states some associated dizziness. CP is worse with movement. AFVSS here. No hypotension. She has some chest tenderness that reproduces the pain. No abdominal tenderness. L foot 2nd degree burn at base of 1-3 toes. Sensate. No signs of surrouding celluliis.Will apply silvadene. I spoke with medicine about admission, they agreed Obs for atypical chest pain is appropriate.   Evelina Bucy, MD 11/17/14 [phone]

## 2014-11-17 NOTE — ED Notes (Signed)
Pt brought in by EMS for right lower chest pain that radiates into the left.  Pt reports the pain has been present for 3 days intermittently and is accompanied by some dizziness.  Pain is described as a sharp pain that is worsened with movement.
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
## 2014-11-18 DIAGNOSIS — N185 Chronic kidney disease, stage 5: Secondary | ICD-10-CM

## 2014-11-18 DIAGNOSIS — R42 Dizziness and giddiness: Secondary | ICD-10-CM | POA: Diagnosis not present

## 2014-11-18 DIAGNOSIS — M170 Bilateral primary osteoarthritis of knee: Secondary | ICD-10-CM | POA: Diagnosis not present

## 2014-11-18 DIAGNOSIS — E872 Acidosis: Secondary | ICD-10-CM

## 2014-11-18 DIAGNOSIS — R079 Chest pain, unspecified: Secondary | ICD-10-CM | POA: Diagnosis not present

## 2014-11-18 DIAGNOSIS — T25222A Burn of second degree of left foot, initial encounter: Secondary | ICD-10-CM | POA: Diagnosis not present

## 2014-11-18 LAB — CBC
HEMATOCRIT: 32.9 % — AB (ref 36.0–46.0)
HEMOGLOBIN: 10.5 g/dL — AB (ref 12.0–15.0)
MCH: 28.2 pg (ref 26.0–34.0)
MCHC: 31.9 g/dL (ref 30.0–36.0)
MCV: 88.2 fL (ref 78.0–100.0)
PLATELETS: 219 10*3/uL (ref 150–400)
RBC: 3.73 MIL/uL — ABNORMAL LOW (ref 3.87–5.11)
RDW: 15.8 % — ABNORMAL HIGH (ref 11.5–15.5)
WBC: 3 10*3/uL — ABNORMAL LOW (ref 4.0–10.5)

## 2014-11-18 LAB — BASIC METABOLIC PANEL
Anion gap: 12 (ref 5–15)
BUN: 70 mg/dL — AB (ref 6–20)
CO2: 17 mmol/L — ABNORMAL LOW (ref 22–32)
Calcium: 8.4 mg/dL — ABNORMAL LOW (ref 8.9–10.3)
Chloride: 108 mmol/L (ref 101–111)
Creatinine, Ser: 5.99 mg/dL — ABNORMAL HIGH (ref 0.44–1.00)
GFR calc Af Amer: 7 mL/min — ABNORMAL LOW (ref >60)
GFR calc non Af Amer: 6 mL/min — ABNORMAL LOW (ref >60)
Glucose, Bld: 84 mg/dL (ref 65–99)
Potassium: 4.2 mmol/L (ref 3.5–5.1)
Sodium: 137 mmol/L (ref 135–145)

## 2014-11-18 LAB — TROPONIN I

## 2014-11-18 LAB — MAGNESIUM: Magnesium: 1.9 mg/dL (ref 1.7–2.4)

## 2014-11-18 LAB — PHOSPHORUS: Phosphorus: 5.7 mg/dL — ABNORMAL HIGH (ref 2.5–4.6)

## 2014-11-18 MED ORDER — SODIUM BICARBONATE 650 MG PO TABS
650.0000 mg | ORAL_TABLET | Freq: Two times a day (BID) | ORAL | Status: DC
  Administered 2014-11-18: 650 mg via ORAL
  Filled 2014-11-18: qty 1

## 2014-11-18 MED ORDER — SILVER SULFADIAZINE 1 % EX CREA: TOPICAL_CREAM | Freq: Every day | CUTANEOUS | Status: DC

## 2014-11-18 MED ORDER — SILVER SULFADIAZINE 1 % EX CREA
TOPICAL_CREAM | Freq: Every day | CUTANEOUS | Status: DC
  Administered 2014-11-18: 10:00:00 via TOPICAL
  Filled 2014-11-18: qty 85

## 2014-11-18 MED ORDER — SODIUM BICARBONATE 650 MG PO TABS: 650.0000 mg | ORAL_TABLET | Freq: Two times a day (BID) | ORAL | Status: DC
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
## 2014-11-18 NOTE — Progress Notes (Signed)
Pt discharged via W/C, condition stable, accompanied by daughter.
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
## 2014-11-18 NOTE — Discharge Instructions (Signed)
Thank you for trusting [place] with your medical care!  You were hospitalized for chest pain.  Please take note of the following changes to your medications: -START bicarbonate 650mg  twice daily  To make sure you are getting better, please make it to the follow-up appointments listed on the first page.  If you have any questions, please call [phone].
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
## 2014-11-23 ENCOUNTER — Encounter (HOSPITAL_COMMUNITY)
Admission: RE | Admit: 2014-11-23 | Discharge: 2014-11-23 | Disposition: A | Discharge status: Home / Self Care | Payer: Medicare Other | Source: Ambulatory Visit | Attending: Nephrology | Admitting: Nephrology

## 2014-11-23 ENCOUNTER — Ambulatory Visit: Payer: Medicare Other | Admitting: Internal Medicine

## 2014-11-23 DIAGNOSIS — N185 Chronic kidney disease, stage 5: Secondary | ICD-10-CM | POA: Diagnosis not present

## 2014-11-23 LAB — POCT HEMOGLOBIN-HEMACUE: Hemoglobin: 11.8 g/dL — ABNORMAL LOW (ref 12.0–15.0)

## 2014-11-23 MED ORDER — EPOETIN ALFA 20000 UNIT/ML IJ SOLN
20000.0000 [IU] | INTRAMUSCULAR | Status: DC
  Administered 2014-11-23: 20000 [IU] via SUBCUTANEOUS

## 2014-11-23 MED ORDER — EPOETIN ALFA 20000 UNIT/ML IJ SOLN
INTRAMUSCULAR | Status: AC
  Filled 2014-11-23: qty 1

## 2014-11-25 ENCOUNTER — Ambulatory Visit (INDEPENDENT_AMBULATORY_CARE_PROVIDER_SITE_OTHER): Payer: Medicare Other | Admitting: Internal Medicine

## 2014-11-25 ENCOUNTER — Encounter: Payer: Self-pay | Admitting: Internal Medicine

## 2014-11-25 VITALS — BP 148/67 | HR 78 | Temp 97.6°F | Ht 63.0 in | Wt 172.1 lb

## 2014-11-25 DIAGNOSIS — T24202D Burn of second degree of unspecified site of left lower limb, except ankle and foot, subsequent encounter: Secondary | ICD-10-CM | POA: Diagnosis not present

## 2014-11-25 DIAGNOSIS — N185 Chronic kidney disease, stage 5: Secondary | ICD-10-CM | POA: Diagnosis not present

## 2014-11-25 DIAGNOSIS — I10 Essential (primary) hypertension: Secondary | ICD-10-CM

## 2014-11-25 DIAGNOSIS — E872 Acidosis, unspecified: Secondary | ICD-10-CM

## 2014-11-25 DIAGNOSIS — X12XXXD Contact with other hot fluids, subsequent encounter: Secondary | ICD-10-CM

## 2014-11-25 DIAGNOSIS — I120 Hypertensive chronic kidney disease with stage 5 chronic kidney disease or end stage renal disease: Secondary | ICD-10-CM

## 2014-11-25 LAB — BASIC METABOLIC PANEL
BUN: 62 mg/dL — ABNORMAL HIGH (ref 6–23)
CHLORIDE: 105 meq/L (ref 96–112)
CO2: 20 mEq/L (ref 19–32)
CREATININE: 5.79 mg/dL — AB (ref 0.50–1.10)
Calcium: 8.9 mg/dL (ref 8.4–10.5)
Glucose, Bld: 86 mg/dL (ref 70–99)
Potassium: 4 mEq/L (ref 3.5–5.3)
Sodium: 138 mEq/L (ref 135–145)

## 2014-11-25 NOTE — Assessment & Plan Note (Signed)
BP Readings from Last 3 Encounters:  11/25/14 148/67  11/18/14 135/72  11/09/14 158/75    Lab Results  Component Value Date   NA 137 11/18/2014   K 4.2 11/18/2014   CREATININE 5.99* 11/18/2014    Assessment: Blood pressure control: improved   Progress toward BP goal: at goal    Patient had called clinic with low BP prior to last admission, but stable BP today.  Plan: Medications:  continue current medications

## 2014-11-25 NOTE — Patient Instructions (Signed)
Please continue to take the sodium bicarbonate pills twice per day. I will call you once your blood test is back from today to let you know if you need to change this amount at all.  Talk with Dr. Mercy Moore about dialysis and we will check in with you at your next appointment to see what you have decided.  Please continue to use the Silvadene cream on your foot wound until it is fully healed.  General Instructions:   Please bring your medicines with you each time you come to clinic.  Medicines may include prescription medications, over-the-counter medications, herbal remedies, eye drops, vitamins, or other pills.   Progress Toward Treatment Goals:  Treatment Goal 10/12/2014  Blood pressure at goal    Self Care Goals & Plans:  Self Care Goal 10/14/2014  Manage my medications take my medicines as prescribed; bring my medications to every visit; refill my medications on time  Monitor my health -  Eat healthy foods drink diet soda or water instead of juice or soda; eat more vegetables; eat foods that are low in salt; eat baked foods instead of fried foods; eat fruit for snacks and desserts  Be physically active -    No flowsheet data found.   Care Management & Community Referrals:  Referral 09/08/2013  Referrals made for care management support none needed
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
## 2014-11-27 DIAGNOSIS — T24209A Burn of second degree of unspecified site of unspecified lower limb, except ankle and foot, initial encounter: Secondary | ICD-10-CM | POA: Insufficient documentation

## 2014-11-27 NOTE — Assessment & Plan Note (Addendum)
She follows with Dr. Mercy Moore and the plan is for her to discuss dialysis with him at her next visit on June 11. Her most recent BUN/Cr from hospitalization was 70/5.99 with GFR 7, K 4.2, CO2 17. She has been thinking about dialysis and learning about how it would change her life.  - Discussed logistics of dialysis and benefits - Continue calcitriol, lasix, sodium bicarb - BMET today to assess for CO2 (will manipulate bicarb if CO2 remains <18)  Addendum: no changes to bicarb (CO2 normalized to 20 at visit)
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
## 2014-11-28 NOTE — Progress Notes (Signed)
Internal Medicine Clinic Attending  Case discussed with Dr. Mallory at the time of the visit.  We reviewed the resident's history and exam and pertinent patient test results.  I agree with the assessment, diagnosis, and plan of care documented in the resident's note. 

## 2014-12-06 ENCOUNTER — Other Ambulatory Visit: Payer: Self-pay | Admitting: Internal Medicine

## 2014-12-06 NOTE — Telephone Encounter (Signed)
This was filled on 5/2 with 2 refills.  Unclear if she is calling in an old bottle?

## 2014-12-07 ENCOUNTER — Encounter (HOSPITAL_COMMUNITY)
Admission: RE | Admit: 2014-12-07 | Discharge: 2014-12-07 | Disposition: A | Discharge status: Home / Self Care | Payer: Medicare Other | Source: Ambulatory Visit | Attending: Nephrology | Admitting: Nephrology

## 2014-12-07 DIAGNOSIS — N185 Chronic kidney disease, stage 5: Secondary | ICD-10-CM | POA: Insufficient documentation

## 2014-12-07 DIAGNOSIS — D631 Anemia in chronic kidney disease: Secondary | ICD-10-CM | POA: Insufficient documentation

## 2014-12-07 LAB — POCT HEMOGLOBIN-HEMACUE: HEMOGLOBIN: 11.4 g/dL — AB (ref 12.0–15.0)

## 2014-12-07 MED ORDER — EPOETIN ALFA 20000 UNIT/ML IJ SOLN
INTRAMUSCULAR | Status: AC
  Filled 2014-12-07: qty 1

## 2014-12-07 MED ORDER — EPOETIN ALFA 20000 UNIT/ML IJ SOLN
20000.0000 [IU] | INTRAMUSCULAR | Status: DC
  Administered 2014-12-07: 20000 [IU] via SUBCUTANEOUS

## 2014-12-07 NOTE — Telephone Encounter (Signed)
Confirmed with pharmacy.

## 2014-12-21 ENCOUNTER — Ambulatory Visit (HOSPITAL_COMMUNITY)
Admission: RE | Admit: 2014-12-21 | Discharge: 2014-12-21 | Disposition: A | Discharge status: Home / Self Care | Payer: Medicare Other | Source: Ambulatory Visit | Attending: Nephrology | Admitting: Nephrology

## 2014-12-21 DIAGNOSIS — D631 Anemia in chronic kidney disease: Secondary | ICD-10-CM | POA: Diagnosis not present

## 2014-12-21 DIAGNOSIS — Z79899 Other long term (current) drug therapy: Secondary | ICD-10-CM | POA: Insufficient documentation

## 2014-12-21 DIAGNOSIS — N185 Chronic kidney disease, stage 5: Secondary | ICD-10-CM | POA: Diagnosis not present

## 2014-12-21 DIAGNOSIS — Z5181 Encounter for therapeutic drug level monitoring: Secondary | ICD-10-CM | POA: Insufficient documentation

## 2014-12-21 LAB — POCT HEMOGLOBIN-HEMACUE: HEMOGLOBIN: 11.8 g/dL — AB (ref 12.0–15.0)

## 2014-12-21 MED ORDER — EPOETIN ALFA 20000 UNIT/ML IJ SOLN
INTRAMUSCULAR | Status: AC
  Filled 2014-12-21: qty 1

## 2014-12-21 MED ORDER — EPOETIN ALFA 20000 UNIT/ML IJ SOLN
20000.0000 [IU] | INTRAMUSCULAR | Status: DC
  Administered 2014-12-21: 20000 [IU] via SUBCUTANEOUS

## 2015-01-04 ENCOUNTER — Encounter (HOSPITAL_COMMUNITY)
Admission: RE | Admit: 2015-01-04 | Discharge: 2015-01-04 | Disposition: A | Discharge status: Home / Self Care | Payer: Medicare Other | Source: Ambulatory Visit | Attending: Nephrology | Admitting: Nephrology

## 2015-01-04 DIAGNOSIS — D638 Anemia in other chronic diseases classified elsewhere: Secondary | ICD-10-CM | POA: Insufficient documentation

## 2015-01-04 DIAGNOSIS — N185 Chronic kidney disease, stage 5: Secondary | ICD-10-CM | POA: Diagnosis not present

## 2015-01-04 LAB — IRON AND TIBC
Iron: 48 ug/dL (ref 28–170)
SATURATION RATIOS: 16 % (ref 10.4–31.8)
TIBC: 293 ug/dL (ref 250–450)
UIBC: 245 ug/dL

## 2015-01-04 LAB — POCT HEMOGLOBIN-HEMACUE: Hemoglobin: 11.5 g/dL — ABNORMAL LOW (ref 12.0–15.0)

## 2015-01-04 LAB — FERRITIN: Ferritin: 62 ng/mL (ref 11–307)

## 2015-01-04 MED ORDER — EPOETIN ALFA 20000 UNIT/ML IJ SOLN
20000.0000 [IU] | INTRAMUSCULAR | Status: DC
  Administered 2015-01-04: 20000 [IU] via SUBCUTANEOUS

## 2015-01-04 MED ORDER — EPOETIN ALFA 20000 UNIT/ML IJ SOLN
INTRAMUSCULAR | Status: AC
  Filled 2015-01-04: qty 1

## 2015-01-11 ENCOUNTER — Encounter: Payer: Medicare Other | Admitting: Internal Medicine

## 2015-01-11 ENCOUNTER — Encounter: Payer: Self-pay | Admitting: Internal Medicine

## 2015-01-11 DIAGNOSIS — I700 Atherosclerosis of aorta: Secondary | ICD-10-CM | POA: Insufficient documentation

## 2015-01-16 DIAGNOSIS — N2581 Secondary hyperparathyroidism of renal origin: Secondary | ICD-10-CM | POA: Diagnosis not present

## 2015-01-16 DIAGNOSIS — I120 Hypertensive chronic kidney disease with stage 5 chronic kidney disease or end stage renal disease: Secondary | ICD-10-CM | POA: Diagnosis not present

## 2015-01-16 DIAGNOSIS — N185 Chronic kidney disease, stage 5: Secondary | ICD-10-CM | POA: Diagnosis not present

## 2015-01-16 DIAGNOSIS — D631 Anemia in chronic kidney disease: Secondary | ICD-10-CM | POA: Diagnosis not present

## 2015-01-17 ENCOUNTER — Other Ambulatory Visit (HOSPITAL_COMMUNITY): Payer: Self-pay | Admitting: *Deleted

## 2015-01-18 ENCOUNTER — Encounter (HOSPITAL_COMMUNITY)
Admission: RE | Admit: 2015-01-18 | Discharge: 2015-01-18 | Disposition: A | Discharge status: Home / Self Care | Payer: Medicare Other | Source: Ambulatory Visit | Attending: Nephrology | Admitting: Nephrology

## 2015-01-18 DIAGNOSIS — N185 Chronic kidney disease, stage 5: Secondary | ICD-10-CM | POA: Insufficient documentation

## 2015-01-18 DIAGNOSIS — D631 Anemia in chronic kidney disease: Secondary | ICD-10-CM | POA: Insufficient documentation

## 2015-01-18 LAB — POCT HEMOGLOBIN-HEMACUE: Hemoglobin: 11.3 g/dL — ABNORMAL LOW (ref 12.0–15.0)

## 2015-01-18 MED ORDER — SODIUM CHLORIDE 0.9 % IV SOLN
510.0000 mg | INTRAVENOUS | Status: DC
  Administered 2015-01-18: 510 mg via INTRAVENOUS
  Filled 2015-01-18: qty 17

## 2015-01-18 MED ORDER — EPOETIN ALFA 20000 UNIT/ML IJ SOLN
INTRAMUSCULAR | Status: AC
  Administered 2015-01-18: 20000 [IU] via SUBCUTANEOUS
  Filled 2015-01-18: qty 1

## 2015-01-18 MED ORDER — EPOETIN ALFA 20000 UNIT/ML IJ SOLN
20000.0000 [IU] | INTRAMUSCULAR | Status: DC
  Administered 2015-01-18: 20000 [IU] via SUBCUTANEOUS

## 2015-01-19 ENCOUNTER — Other Ambulatory Visit: Payer: Self-pay | Admitting: *Deleted

## 2015-01-19 DIAGNOSIS — Z01810 Encounter for preprocedural cardiovascular examination: Secondary | ICD-10-CM

## 2015-01-19 DIAGNOSIS — N185 Chronic kidney disease, stage 5: Secondary | ICD-10-CM

## 2015-02-01 ENCOUNTER — Encounter (HOSPITAL_COMMUNITY)
Admission: RE | Admit: 2015-02-01 | Discharge: 2015-02-01 | Disposition: A | Discharge status: Home / Self Care | Payer: Medicare Other | Source: Ambulatory Visit | Attending: Nephrology | Admitting: Nephrology

## 2015-02-01 DIAGNOSIS — N185 Chronic kidney disease, stage 5: Secondary | ICD-10-CM | POA: Diagnosis not present

## 2015-02-01 DIAGNOSIS — D631 Anemia in chronic kidney disease: Secondary | ICD-10-CM | POA: Diagnosis not present

## 2015-02-01 LAB — IRON AND TIBC
IRON: 55 ug/dL (ref 28–170)
Saturation Ratios: 23 % (ref 10.4–31.8)
TIBC: 242 ug/dL — ABNORMAL LOW (ref 250–450)
UIBC: 187 ug/dL

## 2015-02-01 LAB — POCT HEMOGLOBIN-HEMACUE: Hemoglobin: 12.5 g/dL (ref 12.0–15.0)

## 2015-02-01 LAB — FERRITIN: Ferritin: 246 ng/mL (ref 11–307)

## 2015-02-01 MED ORDER — EPOETIN ALFA 20000 UNIT/ML IJ SOLN: 20000.0000 [IU] | INTRAMUSCULAR | Status: DC

## 2015-02-01 MED ORDER — SODIUM CHLORIDE 0.9 % IV SOLN
510.0000 mg | INTRAVENOUS | Status: AC
  Administered 2015-02-01: 510 mg via INTRAVENOUS
  Filled 2015-02-01: qty 17

## 2015-02-14 ENCOUNTER — Other Ambulatory Visit (HOSPITAL_COMMUNITY): Payer: Self-pay | Admitting: *Deleted

## 2015-02-15 ENCOUNTER — Encounter (HOSPITAL_COMMUNITY)
Admission: RE | Admit: 2015-02-15 | Discharge: 2015-02-15 | Disposition: A | Discharge status: Home / Self Care | Payer: Medicare Other | Source: Ambulatory Visit | Attending: Nephrology | Admitting: Nephrology

## 2015-02-15 DIAGNOSIS — D631 Anemia in chronic kidney disease: Secondary | ICD-10-CM | POA: Diagnosis not present

## 2015-02-15 DIAGNOSIS — N185 Chronic kidney disease, stage 5: Secondary | ICD-10-CM | POA: Insufficient documentation

## 2015-02-15 LAB — POCT HEMOGLOBIN-HEMACUE: Hemoglobin: 11.5 g/dL — ABNORMAL LOW (ref 12.0–15.0)

## 2015-02-15 MED ORDER — EPOETIN ALFA 20000 UNIT/ML IJ SOLN
INTRAMUSCULAR | Status: AC
  Filled 2015-02-15: qty 1

## 2015-02-15 MED ORDER — EPOETIN ALFA 20000 UNIT/ML IJ SOLN
20000.0000 [IU] | INTRAMUSCULAR | Status: DC
  Administered 2015-02-15: 20000 [IU] via SUBCUTANEOUS

## 2015-02-23 ENCOUNTER — Other Ambulatory Visit (HOSPITAL_COMMUNITY): Payer: Medicare Other

## 2015-02-23 ENCOUNTER — Ambulatory Visit: Payer: Medicare Other | Admitting: Vascular Surgery

## 2015-02-23 ENCOUNTER — Encounter (HOSPITAL_COMMUNITY): Payer: Medicare Other

## 2015-03-05 ENCOUNTER — Other Ambulatory Visit: Payer: Self-pay | Admitting: Internal Medicine

## 2015-03-07 ENCOUNTER — Other Ambulatory Visit: Payer: Self-pay | Admitting: Internal Medicine

## 2015-03-07 NOTE — Telephone Encounter (Signed)
Filled yesterday

## 2015-03-08 ENCOUNTER — Encounter (HOSPITAL_COMMUNITY)
Admission: RE | Admit: 2015-03-08 | Discharge: 2015-03-08 | Disposition: A | Discharge status: Home / Self Care | Payer: Medicare Other | Source: Ambulatory Visit | Attending: Nephrology | Admitting: Nephrology

## 2015-03-08 DIAGNOSIS — D631 Anemia in chronic kidney disease: Secondary | ICD-10-CM | POA: Diagnosis not present

## 2015-03-08 DIAGNOSIS — N185 Chronic kidney disease, stage 5: Secondary | ICD-10-CM | POA: Diagnosis not present

## 2015-03-08 LAB — POCT HEMOGLOBIN-HEMACUE: HEMOGLOBIN: 11.6 g/dL — AB (ref 12.0–15.0)

## 2015-03-08 MED ORDER — EPOETIN ALFA 20000 UNIT/ML IJ SOLN
INTRAMUSCULAR | Status: AC
  Filled 2015-03-08: qty 1

## 2015-03-08 MED ORDER — EPOETIN ALFA 20000 UNIT/ML IJ SOLN
20000.0000 [IU] | INTRAMUSCULAR | Status: DC
  Administered 2015-03-08: 20000 [IU] via SUBCUTANEOUS

## 2015-03-10 ENCOUNTER — Encounter (HOSPITAL_COMMUNITY): Payer: Medicare Other

## 2015-03-10 ENCOUNTER — Other Ambulatory Visit (HOSPITAL_COMMUNITY): Payer: Medicare Other

## 2015-03-10 ENCOUNTER — Ambulatory Visit: Payer: Medicare Other | Admitting: Vascular Surgery

## 2015-03-14 ENCOUNTER — Encounter: Payer: Self-pay | Admitting: Vascular Surgery

## 2015-03-15 ENCOUNTER — Ambulatory Visit: Payer: Medicare Other | Admitting: Vascular Surgery

## 2015-03-15 ENCOUNTER — Other Ambulatory Visit (HOSPITAL_COMMUNITY): Payer: Medicare Other

## 2015-03-15 ENCOUNTER — Encounter (HOSPITAL_COMMUNITY): Payer: Medicare Other

## 2015-03-20 DIAGNOSIS — N2581 Secondary hyperparathyroidism of renal origin: Secondary | ICD-10-CM | POA: Diagnosis not present

## 2015-03-20 DIAGNOSIS — D631 Anemia in chronic kidney disease: Secondary | ICD-10-CM | POA: Diagnosis not present

## 2015-03-20 DIAGNOSIS — I120 Hypertensive chronic kidney disease with stage 5 chronic kidney disease or end stage renal disease: Secondary | ICD-10-CM | POA: Diagnosis not present

## 2015-03-20 DIAGNOSIS — N185 Chronic kidney disease, stage 5: Secondary | ICD-10-CM | POA: Diagnosis not present

## 2015-03-29 ENCOUNTER — Encounter (HOSPITAL_COMMUNITY)
Admission: RE | Admit: 2015-03-29 | Discharge: 2015-03-29 | Disposition: A | Discharge status: Home / Self Care | Payer: Medicare Other | Source: Ambulatory Visit | Attending: Nephrology | Admitting: Nephrology

## 2015-03-29 DIAGNOSIS — N185 Chronic kidney disease, stage 5: Secondary | ICD-10-CM | POA: Insufficient documentation

## 2015-03-29 DIAGNOSIS — D631 Anemia in chronic kidney disease: Secondary | ICD-10-CM | POA: Insufficient documentation

## 2015-03-29 LAB — POCT HEMOGLOBIN-HEMACUE: HEMOGLOBIN: 10.8 g/dL — AB (ref 12.0–15.0)

## 2015-03-29 MED ORDER — EPOETIN ALFA 20000 UNIT/ML IJ SOLN
20000.0000 [IU] | INTRAMUSCULAR | Status: DC
  Administered 2015-03-29: 20000 [IU] via SUBCUTANEOUS

## 2015-03-29 MED ORDER — EPOETIN ALFA 20000 UNIT/ML IJ SOLN
INTRAMUSCULAR | Status: AC
  Filled 2015-03-29: qty 1

## 2015-04-03 ENCOUNTER — Encounter: Payer: Self-pay | Admitting: Vascular Surgery

## 2015-04-05 ENCOUNTER — Encounter (HOSPITAL_COMMUNITY): Payer: Medicare Other

## 2015-04-05 ENCOUNTER — Other Ambulatory Visit (HOSPITAL_COMMUNITY): Payer: Medicare Other

## 2015-04-05 ENCOUNTER — Other Ambulatory Visit: Payer: Self-pay | Admitting: Internal Medicine

## 2015-04-05 ENCOUNTER — Ambulatory Visit: Payer: Medicare Other | Admitting: Vascular Surgery

## 2015-04-19 ENCOUNTER — Ambulatory Visit (INDEPENDENT_AMBULATORY_CARE_PROVIDER_SITE_OTHER): Payer: Medicare Other | Admitting: *Deleted

## 2015-04-19 ENCOUNTER — Encounter (HOSPITAL_COMMUNITY)
Admission: RE | Admit: 2015-04-19 | Discharge: 2015-04-19 | Disposition: A | Discharge status: Home / Self Care | Payer: Medicare Other | Source: Ambulatory Visit | Attending: Nephrology | Admitting: Nephrology

## 2015-04-19 DIAGNOSIS — N185 Chronic kidney disease, stage 5: Secondary | ICD-10-CM | POA: Diagnosis not present

## 2015-04-19 DIAGNOSIS — Z23 Encounter for immunization: Secondary | ICD-10-CM | POA: Diagnosis not present

## 2015-04-19 DIAGNOSIS — D631 Anemia in chronic kidney disease: Secondary | ICD-10-CM | POA: Diagnosis not present

## 2015-04-19 LAB — POCT HEMOGLOBIN-HEMACUE: Hemoglobin: 11.1 g/dL — ABNORMAL LOW (ref 12.0–15.0)

## 2015-04-19 MED ORDER — EPOETIN ALFA 20000 UNIT/ML IJ SOLN
20000.0000 [IU] | INTRAMUSCULAR | Status: DC
  Administered 2015-04-19: 20000 [IU] via SUBCUTANEOUS

## 2015-04-19 MED ORDER — EPOETIN ALFA 20000 UNIT/ML IJ SOLN
INTRAMUSCULAR | Status: AC
Start: 2015-04-19 — End: 2015-04-19
  Administered 2015-04-19: 20000 [IU] via SUBCUTANEOUS
  Filled 2015-04-19: qty 1

## 2015-04-21 DIAGNOSIS — D631 Anemia in chronic kidney disease: Secondary | ICD-10-CM | POA: Diagnosis not present

## 2015-04-21 DIAGNOSIS — I120 Hypertensive chronic kidney disease with stage 5 chronic kidney disease or end stage renal disease: Secondary | ICD-10-CM | POA: Diagnosis not present

## 2015-04-21 DIAGNOSIS — N185 Chronic kidney disease, stage 5: Secondary | ICD-10-CM | POA: Diagnosis not present

## 2015-04-21 DIAGNOSIS — N2581 Secondary hyperparathyroidism of renal origin: Secondary | ICD-10-CM | POA: Diagnosis not present

## 2015-05-05 ENCOUNTER — Other Ambulatory Visit: Payer: Self-pay | Admitting: Internal Medicine

## 2015-05-10 ENCOUNTER — Encounter (HOSPITAL_COMMUNITY)
Admission: RE | Admit: 2015-05-10 | Discharge: 2015-05-10 | Disposition: A | Discharge status: Home / Self Care | Payer: Medicare Other | Source: Ambulatory Visit | Attending: Nephrology | Admitting: Nephrology

## 2015-05-10 DIAGNOSIS — D631 Anemia in chronic kidney disease: Secondary | ICD-10-CM | POA: Insufficient documentation

## 2015-05-10 DIAGNOSIS — N185 Chronic kidney disease, stage 5: Secondary | ICD-10-CM | POA: Insufficient documentation

## 2015-05-10 LAB — POCT HEMOGLOBIN-HEMACUE: Hemoglobin: 11.5 g/dL — ABNORMAL LOW (ref 12.0–15.0)

## 2015-05-10 MED ORDER — EPOETIN ALFA 20000 UNIT/ML IJ SOLN
INTRAMUSCULAR | Status: AC
  Filled 2015-05-10: qty 1

## 2015-05-10 MED ORDER — EPOETIN ALFA 20000 UNIT/ML IJ SOLN
20000.0000 [IU] | INTRAMUSCULAR | Status: DC
  Administered 2015-05-10: 20000 [IU] via SUBCUTANEOUS

## 2015-05-17 ENCOUNTER — Ambulatory Visit: Payer: Medicare Other | Admitting: Surgery

## 2015-05-17 ENCOUNTER — Encounter (HOSPITAL_COMMUNITY): Payer: Medicare Other

## 2015-05-17 ENCOUNTER — Other Ambulatory Visit (HOSPITAL_COMMUNITY): Payer: Medicare Other

## 2015-05-25 ENCOUNTER — Encounter: Payer: Self-pay | Admitting: Vascular Surgery

## 2015-05-26 DIAGNOSIS — N2581 Secondary hyperparathyroidism of renal origin: Secondary | ICD-10-CM | POA: Diagnosis not present

## 2015-05-26 DIAGNOSIS — I120 Hypertensive chronic kidney disease with stage 5 chronic kidney disease or end stage renal disease: Secondary | ICD-10-CM | POA: Diagnosis not present

## 2015-05-26 DIAGNOSIS — D631 Anemia in chronic kidney disease: Secondary | ICD-10-CM | POA: Diagnosis not present

## 2015-05-26 DIAGNOSIS — N185 Chronic kidney disease, stage 5: Secondary | ICD-10-CM | POA: Diagnosis not present

## 2015-05-30 ENCOUNTER — Ambulatory Visit: Payer: Medicare Other | Admitting: Vascular Surgery

## 2015-05-30 ENCOUNTER — Encounter (HOSPITAL_COMMUNITY): Payer: Medicare Other

## 2015-05-30 ENCOUNTER — Other Ambulatory Visit (HOSPITAL_COMMUNITY): Payer: Medicare Other

## 2015-05-31 ENCOUNTER — Encounter (HOSPITAL_COMMUNITY)
Admission: RE | Admit: 2015-05-31 | Discharge: 2015-05-31 | Disposition: A | Discharge status: Home / Self Care | Payer: Medicare Other | Source: Ambulatory Visit | Attending: Nephrology | Admitting: Nephrology

## 2015-05-31 DIAGNOSIS — D631 Anemia in chronic kidney disease: Secondary | ICD-10-CM | POA: Diagnosis not present

## 2015-05-31 DIAGNOSIS — N185 Chronic kidney disease, stage 5: Secondary | ICD-10-CM | POA: Diagnosis not present

## 2015-05-31 LAB — FERRITIN: Ferritin: 264 ng/mL (ref 11–307)

## 2015-05-31 LAB — IRON AND TIBC
Iron: 81 ug/dL (ref 28–170)
Saturation Ratios: 33 % — ABNORMAL HIGH (ref 10.4–31.8)
TIBC: 242 ug/dL — AB (ref 250–450)
UIBC: 161 ug/dL

## 2015-05-31 MED ORDER — EPOETIN ALFA 20000 UNIT/ML IJ SOLN
20000.0000 [IU] | INTRAMUSCULAR | Status: DC
  Administered 2015-05-31: 20000 [IU] via SUBCUTANEOUS

## 2015-05-31 MED ORDER — EPOETIN ALFA 20000 UNIT/ML IJ SOLN
INTRAMUSCULAR | Status: AC
  Filled 2015-05-31: qty 1

## 2015-06-02 LAB — POCT HEMOGLOBIN-HEMACUE: Hemoglobin: 10.7 g/dL — ABNORMAL LOW (ref 12.0–15.0)

## 2015-06-06 ENCOUNTER — Other Ambulatory Visit: Payer: Self-pay | Admitting: Internal Medicine

## 2015-06-21 ENCOUNTER — Encounter (HOSPITAL_COMMUNITY)
Admission: RE | Admit: 2015-06-21 | Discharge: 2015-06-21 | Disposition: A | Discharge status: Home / Self Care | Payer: Medicare Other | Source: Ambulatory Visit | Attending: Nephrology | Admitting: Nephrology

## 2015-06-21 DIAGNOSIS — D631 Anemia in chronic kidney disease: Secondary | ICD-10-CM | POA: Insufficient documentation

## 2015-06-21 DIAGNOSIS — N185 Chronic kidney disease, stage 5: Secondary | ICD-10-CM | POA: Diagnosis not present

## 2015-06-21 LAB — POCT HEMOGLOBIN-HEMACUE: HEMOGLOBIN: 9.8 g/dL — AB (ref 12.0–15.0)

## 2015-06-21 MED ORDER — EPOETIN ALFA 20000 UNIT/ML IJ SOLN
INTRAMUSCULAR | Status: AC
  Filled 2015-06-21: qty 1

## 2015-06-21 MED ORDER — EPOETIN ALFA 20000 UNIT/ML IJ SOLN
20000.0000 [IU] | INTRAMUSCULAR | Status: DC
  Administered 2015-06-21: 20000 [IU] via SUBCUTANEOUS

## 2015-06-22 DIAGNOSIS — I120 Hypertensive chronic kidney disease with stage 5 chronic kidney disease or end stage renal disease: Secondary | ICD-10-CM | POA: Diagnosis not present

## 2015-06-22 DIAGNOSIS — D631 Anemia in chronic kidney disease: Secondary | ICD-10-CM | POA: Diagnosis not present

## 2015-06-22 DIAGNOSIS — N2581 Secondary hyperparathyroidism of renal origin: Secondary | ICD-10-CM | POA: Diagnosis not present

## 2015-06-22 DIAGNOSIS — N185 Chronic kidney disease, stage 5: Secondary | ICD-10-CM | POA: Diagnosis not present

## 2015-06-26 ENCOUNTER — Telehealth: Payer: Self-pay | Admitting: Internal Medicine

## 2015-06-26 NOTE — Telephone Encounter (Signed)
Please call pt back regarding blood pressure.

## 2015-06-26 NOTE — Telephone Encounter (Signed)
Pt calls and states her spouse checked her BP and it was 105/50, she is worried it is too low, she is advised that she should call 911 for shortness of breath, weakness, dizziness, chest pain, confusion. She is agreeable, appt thurs 12/22 dr rice

## 2015-06-26 NOTE — Telephone Encounter (Signed)
Thanks [name], agree with this plan.

## 2015-06-29 ENCOUNTER — Ambulatory Visit: Payer: Medicare Other | Admitting: Internal Medicine

## 2015-07-12 ENCOUNTER — Encounter (HOSPITAL_COMMUNITY)
Admission: RE | Admit: 2015-07-12 | Discharge: 2015-07-12 | Disposition: A | Discharge status: Home / Self Care | Payer: Medicare Other | Source: Ambulatory Visit | Attending: Nephrology | Admitting: Nephrology

## 2015-07-12 DIAGNOSIS — D631 Anemia in chronic kidney disease: Secondary | ICD-10-CM | POA: Insufficient documentation

## 2015-07-12 DIAGNOSIS — N185 Chronic kidney disease, stage 5: Secondary | ICD-10-CM | POA: Insufficient documentation

## 2015-07-12 LAB — IRON AND TIBC
Iron: 81 ug/dL (ref 28–170)
SATURATION RATIOS: 36 % — AB (ref 10.4–31.8)
TIBC: 223 ug/dL — AB (ref 250–450)
UIBC: 142 ug/dL

## 2015-07-12 LAB — POCT HEMOGLOBIN-HEMACUE: Hemoglobin: 11 g/dL — ABNORMAL LOW (ref 12.0–15.0)

## 2015-07-12 LAB — FERRITIN: Ferritin: 257 ng/mL (ref 11–307)

## 2015-07-12 MED ORDER — EPOETIN ALFA 20000 UNIT/ML IJ SOLN
INTRAMUSCULAR | Status: AC
  Filled 2015-07-12: qty 1

## 2015-07-12 MED ORDER — EPOETIN ALFA 20000 UNIT/ML IJ SOLN
20000.0000 [IU] | INTRAMUSCULAR | Status: DC
Start: 2015-07-12 — End: 2015-07-13
  Administered 2015-07-12: 20000 [IU] via SUBCUTANEOUS

## 2015-07-24 DIAGNOSIS — I120 Hypertensive chronic kidney disease with stage 5 chronic kidney disease or end stage renal disease: Secondary | ICD-10-CM | POA: Diagnosis not present

## 2015-07-24 DIAGNOSIS — N185 Chronic kidney disease, stage 5: Secondary | ICD-10-CM | POA: Diagnosis not present

## 2015-07-24 DIAGNOSIS — D631 Anemia in chronic kidney disease: Secondary | ICD-10-CM | POA: Diagnosis not present

## 2015-07-24 DIAGNOSIS — N2581 Secondary hyperparathyroidism of renal origin: Secondary | ICD-10-CM | POA: Diagnosis not present

## 2015-08-02 ENCOUNTER — Encounter (HOSPITAL_COMMUNITY)
Admission: RE | Admit: 2015-08-02 | Discharge: 2015-08-02 | Disposition: A | Discharge status: Home / Self Care | Payer: Medicare Other | Source: Ambulatory Visit | Attending: Nephrology | Admitting: Nephrology

## 2015-08-02 DIAGNOSIS — N185 Chronic kidney disease, stage 5: Secondary | ICD-10-CM | POA: Diagnosis not present

## 2015-08-02 DIAGNOSIS — D631 Anemia in chronic kidney disease: Secondary | ICD-10-CM | POA: Diagnosis not present

## 2015-08-02 LAB — POCT HEMOGLOBIN-HEMACUE: Hemoglobin: 11.2 g/dL — ABNORMAL LOW (ref 12.0–15.0)

## 2015-08-02 MED ORDER — EPOETIN ALFA 20000 UNIT/ML IJ SOLN
INTRAMUSCULAR | Status: AC
  Filled 2015-08-02: qty 1

## 2015-08-02 MED ORDER — EPOETIN ALFA 20000 UNIT/ML IJ SOLN
20000.0000 [IU] | INTRAMUSCULAR | Status: DC
  Administered 2015-08-02: 20000 [IU] via SUBCUTANEOUS

## 2015-08-15 ENCOUNTER — Telehealth: Payer: Self-pay | Admitting: Internal Medicine

## 2015-08-15 NOTE — Telephone Encounter (Signed)
BRITTANY [phone] CALLED TO [facility] THEY FAXED AN ORDER FOR CREAM FOR PATIENT ON 08/04/15 AND HAVE NOT HEARD BACK WITH APPROVAL,

## 2015-08-15 NOTE — Telephone Encounter (Signed)
A pharmacy calls for a script for lidocaine ointment for pt to use for pain, they are informed pt will need to come to an office visit and speak to md about this, he states he will notify her

## 2015-08-23 ENCOUNTER — Encounter (HOSPITAL_COMMUNITY)
Admission: RE | Admit: 2015-08-23 | Discharge: 2015-08-23 | Disposition: A | Discharge status: Home / Self Care | Payer: Medicare Other | Source: Ambulatory Visit | Attending: Nephrology | Admitting: Nephrology

## 2015-08-23 DIAGNOSIS — D631 Anemia in chronic kidney disease: Secondary | ICD-10-CM | POA: Insufficient documentation

## 2015-08-23 DIAGNOSIS — N185 Chronic kidney disease, stage 5: Secondary | ICD-10-CM | POA: Diagnosis not present

## 2015-08-23 MED ORDER — EPOETIN ALFA 20000 UNIT/ML IJ SOLN: 20000.0000 [IU] | INTRAMUSCULAR | Status: DC

## 2015-08-23 MED ORDER — EPOETIN ALFA 20000 UNIT/ML IJ SOLN
INTRAMUSCULAR | Status: AC
  Administered 2015-08-23: 20000 [IU]
  Filled 2015-08-23: qty 1

## 2015-08-24 LAB — POCT HEMOGLOBIN-HEMACUE: HEMOGLOBIN: 11.3 g/dL — AB (ref 12.0–15.0)

## 2015-08-31 DIAGNOSIS — N2581 Secondary hyperparathyroidism of renal origin: Secondary | ICD-10-CM | POA: Diagnosis not present

## 2015-08-31 DIAGNOSIS — I120 Hypertensive chronic kidney disease with stage 5 chronic kidney disease or end stage renal disease: Secondary | ICD-10-CM | POA: Diagnosis not present

## 2015-08-31 DIAGNOSIS — D631 Anemia in chronic kidney disease: Secondary | ICD-10-CM | POA: Diagnosis not present

## 2015-08-31 DIAGNOSIS — N185 Chronic kidney disease, stage 5: Secondary | ICD-10-CM | POA: Diagnosis not present

## 2015-09-06 ENCOUNTER — Other Ambulatory Visit: Payer: Self-pay

## 2015-09-06 MED ORDER — FUROSEMIDE 40 MG PO TABS: 40.0000 mg | ORAL_TABLET | Freq: Every day | ORAL | Status: DC

## 2015-09-13 ENCOUNTER — Encounter (HOSPITAL_COMMUNITY)
Admission: RE | Admit: 2015-09-13 | Discharge: 2015-09-13 | Disposition: A | Discharge status: Home / Self Care | Payer: Medicare Other | Source: Ambulatory Visit | Attending: Nephrology | Admitting: Nephrology

## 2015-09-13 DIAGNOSIS — D631 Anemia in chronic kidney disease: Secondary | ICD-10-CM | POA: Insufficient documentation

## 2015-09-13 DIAGNOSIS — N185 Chronic kidney disease, stage 5: Secondary | ICD-10-CM | POA: Insufficient documentation

## 2015-09-13 LAB — POCT HEMOGLOBIN-HEMACUE: HEMOGLOBIN: 10.3 g/dL — AB (ref 12.0–15.0)

## 2015-09-13 MED ORDER — EPOETIN ALFA 20000 UNIT/ML IJ SOLN
20000.0000 [IU] | INTRAMUSCULAR | Status: DC
  Administered 2015-09-13: 20000 [IU] via SUBCUTANEOUS

## 2015-09-13 MED ORDER — EPOETIN ALFA 20000 UNIT/ML IJ SOLN
INTRAMUSCULAR | Status: AC
  Filled 2015-09-13: qty 1

## 2015-09-28 DIAGNOSIS — N185 Chronic kidney disease, stage 5: Secondary | ICD-10-CM | POA: Diagnosis not present

## 2015-09-28 DIAGNOSIS — N2581 Secondary hyperparathyroidism of renal origin: Secondary | ICD-10-CM | POA: Diagnosis not present

## 2015-09-28 DIAGNOSIS — I120 Hypertensive chronic kidney disease with stage 5 chronic kidney disease or end stage renal disease: Secondary | ICD-10-CM | POA: Diagnosis not present

## 2015-09-28 DIAGNOSIS — D631 Anemia in chronic kidney disease: Secondary | ICD-10-CM | POA: Diagnosis not present

## 2015-10-02 ENCOUNTER — Other Ambulatory Visit: Payer: Self-pay

## 2015-10-02 DIAGNOSIS — Z1231 Encounter for screening mammogram for malignant neoplasm of breast: Secondary | ICD-10-CM

## 2015-10-04 ENCOUNTER — Encounter (HOSPITAL_COMMUNITY)
Admission: RE | Admit: 2015-10-04 | Discharge: 2015-10-04 | Disposition: A | Discharge status: Home / Self Care | Payer: Medicare Other | Source: Ambulatory Visit | Attending: Nephrology | Admitting: Nephrology

## 2015-10-04 DIAGNOSIS — N185 Chronic kidney disease, stage 5: Secondary | ICD-10-CM | POA: Diagnosis not present

## 2015-10-04 DIAGNOSIS — D631 Anemia in chronic kidney disease: Secondary | ICD-10-CM | POA: Diagnosis not present

## 2015-10-04 LAB — POCT HEMOGLOBIN-HEMACUE: Hemoglobin: 10.8 g/dL — ABNORMAL LOW (ref 12.0–15.0)

## 2015-10-04 MED ORDER — EPOETIN ALFA 20000 UNIT/ML IJ SOLN
20000.0000 [IU] | INTRAMUSCULAR | Status: DC
  Administered 2015-10-04: 20000 [IU] via SUBCUTANEOUS

## 2015-10-04 MED ORDER — EPOETIN ALFA 20000 UNIT/ML IJ SOLN
INTRAMUSCULAR | Status: AC
  Filled 2015-10-04: qty 1

## 2015-10-06 ENCOUNTER — Encounter: Payer: Self-pay | Admitting: Internal Medicine

## 2015-10-06 ENCOUNTER — Ambulatory Visit: Payer: Medicare Other | Admitting: Internal Medicine

## 2015-10-09 ENCOUNTER — Other Ambulatory Visit: Payer: Self-pay

## 2015-10-11 MED ORDER — AMLODIPINE BESYLATE 10 MG PO TABS: 10.0000 mg | ORAL_TABLET | Freq: Every day | ORAL | Status: DC

## 2015-10-20 ENCOUNTER — Ambulatory Visit: Payer: Medicare Other

## 2015-10-25 ENCOUNTER — Encounter (HOSPITAL_COMMUNITY)
Admission: RE | Admit: 2015-10-25 | Discharge: 2015-10-25 | Disposition: A | Discharge status: Home / Self Care | Payer: Medicare Other | Source: Ambulatory Visit | Attending: Nephrology | Admitting: Nephrology

## 2015-10-25 DIAGNOSIS — N185 Chronic kidney disease, stage 5: Secondary | ICD-10-CM | POA: Diagnosis not present

## 2015-10-25 DIAGNOSIS — D631 Anemia in chronic kidney disease: Secondary | ICD-10-CM | POA: Insufficient documentation

## 2015-10-25 LAB — FERRITIN: FERRITIN: 255 ng/mL (ref 11–307)

## 2015-10-25 LAB — IRON AND TIBC
IRON: 71 ug/dL (ref 28–170)
SATURATION RATIOS: 33 % — AB (ref 10.4–31.8)
TIBC: 218 ug/dL — ABNORMAL LOW (ref 250–450)
UIBC: 147 ug/dL

## 2015-10-25 LAB — RENAL FUNCTION PANEL
Albumin: 3.6 g/dL (ref 3.5–5.0)
Anion gap: 12 (ref 5–15)
BUN: 77 mg/dL — AB (ref 6–20)
CHLORIDE: 110 mmol/L (ref 101–111)
CO2: 20 mmol/L — AB (ref 22–32)
Calcium: 8.7 mg/dL — ABNORMAL LOW (ref 8.9–10.3)
Creatinine, Ser: 6.68 mg/dL — ABNORMAL HIGH (ref 0.44–1.00)
GFR calc Af Amer: 6 mL/min — ABNORMAL LOW (ref >60)
GFR calc non Af Amer: 5 mL/min — ABNORMAL LOW (ref >60)
GLUCOSE: 94 mg/dL (ref 65–99)
POTASSIUM: 4.9 mmol/L (ref 3.5–5.1)
Phosphorus: 6.2 mg/dL — ABNORMAL HIGH (ref 2.5–4.6)
Sodium: 142 mmol/L (ref 135–145)

## 2015-10-25 LAB — POCT HEMOGLOBIN-HEMACUE: HEMOGLOBIN: 9.7 g/dL — AB (ref 12.0–15.0)

## 2015-10-25 MED ORDER — EPOETIN ALFA 20000 UNIT/ML IJ SOLN
INTRAMUSCULAR | Status: AC
  Administered 2015-10-25: 20000 [IU] via SUBCUTANEOUS
  Filled 2015-10-25: qty 1

## 2015-10-25 MED ORDER — EPOETIN ALFA 20000 UNIT/ML IJ SOLN
20000.0000 [IU] | INTRAMUSCULAR | Status: DC
  Administered 2015-10-25: 20000 [IU] via SUBCUTANEOUS

## 2015-11-09 ENCOUNTER — Ambulatory Visit: Payer: Medicare Other

## 2015-11-14 ENCOUNTER — Other Ambulatory Visit (HOSPITAL_COMMUNITY): Payer: Self-pay | Admitting: *Deleted

## 2015-11-15 ENCOUNTER — Encounter (HOSPITAL_COMMUNITY)
Admission: RE | Admit: 2015-11-15 | Discharge: 2015-11-15 | Disposition: A | Discharge status: Home / Self Care | Payer: Medicare Other | Source: Ambulatory Visit | Attending: Nephrology | Admitting: Nephrology

## 2015-11-15 DIAGNOSIS — N185 Chronic kidney disease, stage 5: Secondary | ICD-10-CM | POA: Diagnosis not present

## 2015-11-15 DIAGNOSIS — D631 Anemia in chronic kidney disease: Secondary | ICD-10-CM | POA: Insufficient documentation

## 2015-11-15 LAB — POCT HEMOGLOBIN-HEMACUE: Hemoglobin: 11.1 g/dL — ABNORMAL LOW (ref 12.0–15.0)

## 2015-11-15 MED ORDER — EPOETIN ALFA 20000 UNIT/ML IJ SOLN
20000.0000 [IU] | INTRAMUSCULAR | Status: DC
  Administered 2015-11-15: 20000 [IU] via SUBCUTANEOUS

## 2015-11-15 MED ORDER — EPOETIN ALFA 20000 UNIT/ML IJ SOLN
INTRAMUSCULAR | Status: AC
  Filled 2015-11-15: qty 1

## 2015-11-27 DIAGNOSIS — D631 Anemia in chronic kidney disease: Secondary | ICD-10-CM | POA: Diagnosis not present

## 2015-11-27 DIAGNOSIS — I120 Hypertensive chronic kidney disease with stage 5 chronic kidney disease or end stage renal disease: Secondary | ICD-10-CM | POA: Diagnosis not present

## 2015-11-27 DIAGNOSIS — N2581 Secondary hyperparathyroidism of renal origin: Secondary | ICD-10-CM | POA: Diagnosis not present

## 2015-11-27 DIAGNOSIS — N185 Chronic kidney disease, stage 5: Secondary | ICD-10-CM | POA: Diagnosis not present

## 2015-12-06 ENCOUNTER — Other Ambulatory Visit: Payer: Self-pay | Admitting: *Deleted

## 2015-12-06 ENCOUNTER — Ambulatory Visit: Payer: Medicare Other | Admitting: Internal Medicine

## 2015-12-06 ENCOUNTER — Encounter (HOSPITAL_COMMUNITY)
Admission: RE | Admit: 2015-12-06 | Discharge: 2015-12-06 | Disposition: A | Discharge status: Home / Self Care | Payer: Medicare Other | Source: Ambulatory Visit | Attending: Nephrology | Admitting: Nephrology

## 2015-12-06 ENCOUNTER — Encounter: Payer: Self-pay | Admitting: Internal Medicine

## 2015-12-06 DIAGNOSIS — D631 Anemia in chronic kidney disease: Secondary | ICD-10-CM | POA: Diagnosis not present

## 2015-12-06 DIAGNOSIS — N185 Chronic kidney disease, stage 5: Secondary | ICD-10-CM | POA: Diagnosis not present

## 2015-12-06 LAB — RENAL FUNCTION PANEL
Albumin: 3.9 g/dL (ref 3.5–5.0)
Anion gap: 12 (ref 5–15)
BUN: 67 mg/dL — ABNORMAL HIGH (ref 6–20)
CHLORIDE: 109 mmol/L (ref 101–111)
CO2: 17 mmol/L — AB (ref 22–32)
CREATININE: 7.88 mg/dL — AB (ref 0.44–1.00)
Calcium: 9.1 mg/dL (ref 8.9–10.3)
GFR, EST AFRICAN AMERICAN: 5 mL/min — AB (ref >60)
GFR, EST NON AFRICAN AMERICAN: 4 mL/min — AB (ref >60)
Glucose, Bld: 89 mg/dL (ref 65–99)
PHOSPHORUS: 6.9 mg/dL — AB (ref 2.5–4.6)
POTASSIUM: 4.1 mmol/L (ref 3.5–5.1)
Sodium: 138 mmol/L (ref 135–145)

## 2015-12-06 LAB — POCT HEMOGLOBIN-HEMACUE: Hemoglobin: 11 g/dL — ABNORMAL LOW (ref 12.0–15.0)

## 2015-12-06 MED ORDER — EPOETIN ALFA 20000 UNIT/ML IJ SOLN: 20000.0000 [IU] | INTRAMUSCULAR | Status: DC

## 2015-12-06 MED ORDER — EPOETIN ALFA 20000 UNIT/ML IJ SOLN
INTRAMUSCULAR | Status: AC
  Filled 2015-12-06: qty 1

## 2015-12-06 NOTE — Telephone Encounter (Signed)
Pt has an appt today with Dr Gordy Levan @ 1:15PM. Last seen 11/25/14.

## 2015-12-07 MED ORDER — FUROSEMIDE 40 MG PO TABS: 40.0000 mg | ORAL_TABLET | Freq: Every day | ORAL | Status: DC

## 2015-12-11 ENCOUNTER — Encounter: Payer: Self-pay | Admitting: Internal Medicine

## 2015-12-11 ENCOUNTER — Ambulatory Visit (INDEPENDENT_AMBULATORY_CARE_PROVIDER_SITE_OTHER): Payer: Medicare Other | Admitting: Internal Medicine

## 2015-12-11 VITALS — BP 157/69 | HR 83 | Temp 97.8°F | Ht 63.0 in | Wt 163.8 lb

## 2015-12-11 DIAGNOSIS — M1710 Unilateral primary osteoarthritis, unspecified knee: Secondary | ICD-10-CM

## 2015-12-11 DIAGNOSIS — M170 Bilateral primary osteoarthritis of knee: Secondary | ICD-10-CM

## 2015-12-11 DIAGNOSIS — IMO0002 Reserved for concepts with insufficient information to code with codable children: Secondary | ICD-10-CM

## 2015-12-11 NOTE — Progress Notes (Signed)
Internal Medicine Clinic Attending  I saw and evaluated the patient.  I personally confirmed the key portions of the history and exam documented by Dr. Redmond Pulling and I reviewed pertinent patient test results.  The assessment, diagnosis, and plan were formulated together and I agree with the documentation in the resident's note.

## 2015-12-11 NOTE — Assessment & Plan Note (Addendum)
Assessment:  She has B/L knee OA and has had significant improvement with previous steroid injections.  Last injection over one year ago.  The pain does not respond to tylenol and she cannot take NSAIDs due to CKD.   Plan:  Right knee steroid injection performed as described below.  Procedure:  Right knee steroid injection Performed by:  Duwaine Maxin, DO Supervising attending:  Aldine Contes, MD  The risk and benefits of the procedure were explained to the patient and written consent obtained.  Appropriate time out was observed for correct patient and site identification.  The skin of the anter-lateral knee was sterilized with povidone-iodine x 3.  Cold spray was applied.  The joint was then injected with combo of 1cc (40mg ) Kenalog (NDC# [id number]) and 0.5cc lidocaine.  There was minimal bleeding, the site was covered with a sterile bandaid.  The patient tolerated the procedure and there were no immediate complications.  The patient was advised to notify clinic if she develops redness, swelling at the site or fevers.

## 2015-12-11 NOTE — Progress Notes (Signed)
Subjective:    Patient ID: Nancy [name], female    DOB: 01-Nov-1940, 75 y.o.   MRN: [id number]  HPI Comments: Nancy [name] is a 75 year old woman with PMH as below here with c/o right knee pain x several years and wants steroid injections.  She reports knee pain but no swelling.  She report knee locking and giving for awhile.  She is able to walk with a cane.  Pain 10/10.  She denies preceding trauma, prior knee surgery.  Has known OA. Tylenol not working for her.   She cannot take NSAIDs due to CKD5.  She has had prior knee injections, last one a little over 1 year ago, which she says lasted about a year.    Past Medical History  Diagnosis Date  . CVA (cerebrovascular accident) [facility])     New hemorrhagic per CT scan '09  . Fecal impaction ([place])   . Diverticulosis of colon   . Hypertension   . Pulmonary nodule   . Dysfunctional uterine bleeding   . Postmenopausal   . OA (osteoarthritis)     bilateral knees  . Colitis   . Bacterial sinusitis 09/17/2011  . TINEA CRURIS 01/12/2007  . HERNIORRHAPHY, HX OF 08/11/2006  . CKD (chronic kidney disease) stage 4, GFR 15-29 ml/min ([place]) 08/11/2006    Cr continues to increase. Proteinuria on UA 02/10/12.    Marland Kitchen [id number])    Current Outpatient Prescriptions on File Prior to Visit  Medication Sig Dispense Refill  . acetaminophen (TYLENOL) 500 MG tablet Take 1,000 mg by mouth at bedtime.     Marland Kitchen amLODipine (NORVASC) 10 MG tablet Take 1 tablet (10 mg total) by mouth daily. 30 tablet 5  . calcitRIOL (ROCALTROL) 0.25 MCG capsule Take 0.25 mcg by mouth daily.    . carvedilol (COREG) 12.5 MG tablet Take 12.5 mg by mouth 2 (two) times daily with a meal.    . COMBIGAN 0.2-0.5 % ophthalmic solution Place 1 drop into both eyes every 12 (twelve) hours.   1  . docusate sodium 100 MG CAPS Take 100 mg by mouth daily. (Patient not taking: Reported on 10/12/2014) 10 capsule 0  . famotidine (PEPCID) 40 MG tablet take 1 tablet by mouth once daily 30 tablet 11  .  furosemide (LASIX) 40 MG tablet Take 1 tablet (40 mg total) by mouth daily. 30 tablet 2  . polyethylene glycol (MIRALAX / GLYCOLAX) packet Take 17 g by mouth daily. (Patient not taking: Reported on 10/12/2014) 14 each 0  . silver sulfADIAZINE (SILVADENE) 1 % cream Apply topically daily. 50 g 0  . sodium bicarbonate 650 MG tablet Take 1 tablet (650 mg total) by mouth 2 (two) times daily. 60 tablet 0  . Travoprost, BAK Free, (TRAVATAN) 0.004 % SOLN ophthalmic solution Place 1-2 drops into both eyes 2 (two) times daily. 1 drops in am, 2 drop in pm     No current facility-administered medications on file prior to visit.    Review of Systems  Constitutional: Negative for fever and appetite change.  Respiratory: Negative for shortness of breath.   Musculoskeletal: Positive for arthralgias. Negative for joint swelling.       Filed Vitals:   12/11/15 0908  BP: 157/69  Pulse: 83  Temp: 97.8 F (36.6 C)  TempSrc: Oral  Height: 5\' 3"  (1.6 m)  Weight: 163 lb 12.8 oz (74.299 kg)  SpO2: 100%    Objective:   Physical Exam  Constitutional: She appears well-developed. No distress.  HENT:  Head: Normocephalic and atraumatic.  Mouth/Throat: Oropharynx is clear and moist. No oropharyngeal exudate.  Eyes: Conjunctivae and EOM are normal. No scleral icterus.  Neck: Neck supple.  Cardiovascular: Normal rate, regular rhythm and normal heart sounds.  Exam reveals no gallop and no friction rub.   No murmur heard. Pulmonary/Chest: Effort normal and breath sounds normal. No respiratory distress. She has no wheezes. She has no rales.  Abdominal: Soft. Bowel sounds are normal. She exhibits no distension. There is no tenderness. There is no rebound and no guarding.  Musculoskeletal: She exhibits tenderness. She exhibits no edema.  Anterior knee TTP.  There is no redness, increased warmth or swelling.  Discomfort w/ ROM.  Skin: She is not diaphoretic.  Vitals reviewed.         Assessment & Plan:    Please see problem based charting for A&P.

## 2015-12-27 ENCOUNTER — Encounter (HOSPITAL_COMMUNITY)
Admission: RE | Admit: 2015-12-27 | Discharge: 2015-12-27 | Disposition: A | Discharge status: Home / Self Care | Payer: Medicare Other | Source: Ambulatory Visit | Attending: Nephrology | Admitting: Nephrology

## 2015-12-27 DIAGNOSIS — N185 Chronic kidney disease, stage 5: Secondary | ICD-10-CM | POA: Diagnosis not present

## 2015-12-27 DIAGNOSIS — D631 Anemia in chronic kidney disease: Secondary | ICD-10-CM | POA: Diagnosis not present

## 2015-12-27 LAB — POCT HEMOGLOBIN-HEMACUE: Hemoglobin: 11.1 g/dL — ABNORMAL LOW (ref 12.0–15.0)

## 2015-12-27 MED ORDER — EPOETIN ALFA 20000 UNIT/ML IJ SOLN
20000.0000 [IU] | INTRAMUSCULAR | Status: DC
  Administered 2015-12-27: 20000 [IU] via SUBCUTANEOUS

## 2015-12-27 MED ORDER — EPOETIN ALFA 20000 UNIT/ML IJ SOLN
INTRAMUSCULAR | Status: AC
  Filled 2015-12-27: qty 1

## 2016-01-17 ENCOUNTER — Encounter (HOSPITAL_COMMUNITY)
Admission: RE | Admit: 2016-01-17 | Discharge: 2016-01-17 | Disposition: A | Discharge status: Home / Self Care | Payer: Medicare Other | Source: Ambulatory Visit | Attending: Nephrology | Admitting: Nephrology

## 2016-01-17 DIAGNOSIS — D631 Anemia in chronic kidney disease: Secondary | ICD-10-CM | POA: Insufficient documentation

## 2016-01-17 DIAGNOSIS — N185 Chronic kidney disease, stage 5: Secondary | ICD-10-CM | POA: Insufficient documentation

## 2016-01-17 LAB — IRON AND TIBC
Iron: 102 ug/dL (ref 28–170)
Saturation Ratios: 46 % — ABNORMAL HIGH (ref 10.4–31.8)
TIBC: 223 ug/dL — ABNORMAL LOW (ref 250–450)
UIBC: 121 ug/dL

## 2016-01-17 LAB — POCT HEMOGLOBIN-HEMACUE: Hemoglobin: 11 g/dL — ABNORMAL LOW (ref 12.0–15.0)

## 2016-01-17 LAB — FERRITIN: Ferritin: 279 ng/mL (ref 11–307)

## 2016-01-17 MED ORDER — EPOETIN ALFA 20000 UNIT/ML IJ SOLN
INTRAMUSCULAR | Status: AC
  Filled 2016-01-17: qty 1

## 2016-01-17 MED ORDER — EPOETIN ALFA 20000 UNIT/ML IJ SOLN
20000.0000 [IU] | INTRAMUSCULAR | Status: DC
  Administered 2016-01-17: 20000 [IU] via SUBCUTANEOUS

## 2016-01-26 DIAGNOSIS — I120 Hypertensive chronic kidney disease with stage 5 chronic kidney disease or end stage renal disease: Secondary | ICD-10-CM | POA: Diagnosis not present

## 2016-01-26 DIAGNOSIS — N185 Chronic kidney disease, stage 5: Secondary | ICD-10-CM | POA: Diagnosis not present

## 2016-01-26 DIAGNOSIS — N2581 Secondary hyperparathyroidism of renal origin: Secondary | ICD-10-CM | POA: Diagnosis not present

## 2016-01-26 DIAGNOSIS — D631 Anemia in chronic kidney disease: Secondary | ICD-10-CM | POA: Diagnosis not present

## 2016-02-01 ENCOUNTER — Other Ambulatory Visit: Payer: Self-pay | Admitting: Nephrology

## 2016-02-01 DIAGNOSIS — D638 Anemia in other chronic diseases classified elsewhere: Secondary | ICD-10-CM

## 2016-02-01 DIAGNOSIS — N189 Chronic kidney disease, unspecified: Secondary | ICD-10-CM | POA: Insufficient documentation

## 2016-02-01 DIAGNOSIS — D631 Anemia in chronic kidney disease: Secondary | ICD-10-CM

## 2016-02-07 ENCOUNTER — Other Ambulatory Visit: Payer: Self-pay | Admitting: Internal Medicine

## 2016-02-07 ENCOUNTER — Encounter (HOSPITAL_COMMUNITY)
Admission: RE | Admit: 2016-02-07 | Discharge: 2016-02-07 | Disposition: A | Discharge status: Home / Self Care | Payer: Medicare Other | Source: Ambulatory Visit | Attending: Nephrology | Admitting: Nephrology

## 2016-02-07 DIAGNOSIS — D631 Anemia in chronic kidney disease: Secondary | ICD-10-CM | POA: Diagnosis not present

## 2016-02-07 DIAGNOSIS — N185 Chronic kidney disease, stage 5: Secondary | ICD-10-CM

## 2016-02-07 LAB — POCT HEMOGLOBIN-HEMACUE: Hemoglobin: 10.2 g/dL — ABNORMAL LOW (ref 12.0–15.0)

## 2016-02-07 MED ORDER — EPOETIN ALFA 20000 UNIT/ML IJ SOLN
20000.0000 [IU] | INTRAMUSCULAR | Status: DC
  Administered 2016-02-07: 20000 [IU] via SUBCUTANEOUS

## 2016-02-07 MED ORDER — EPOETIN ALFA 20000 UNIT/ML IJ SOLN
INTRAMUSCULAR | Status: AC
  Administered 2016-02-07: 20000 [IU] via SUBCUTANEOUS
  Filled 2016-02-07: qty 1

## 2016-02-07 NOTE — Telephone Encounter (Signed)
Last appt 12/11/15; no f/u appt.

## 2016-02-28 ENCOUNTER — Encounter (HOSPITAL_COMMUNITY)
Admission: RE | Admit: 2016-02-28 | Discharge: 2016-02-28 | Disposition: A | Discharge status: Home / Self Care | Payer: Medicare Other | Source: Ambulatory Visit | Attending: Nephrology | Admitting: Nephrology

## 2016-02-28 DIAGNOSIS — N185 Chronic kidney disease, stage 5: Secondary | ICD-10-CM

## 2016-02-28 DIAGNOSIS — D631 Anemia in chronic kidney disease: Secondary | ICD-10-CM | POA: Diagnosis not present

## 2016-02-28 LAB — POCT HEMOGLOBIN-HEMACUE: Hemoglobin: 10.2 g/dL — ABNORMAL LOW (ref 12.0–15.0)

## 2016-02-28 MED ORDER — EPOETIN ALFA 20000 UNIT/ML IJ SOLN
20000.0000 [IU] | INTRAMUSCULAR | Status: DC
  Administered 2016-02-28: 20000 [IU] via SUBCUTANEOUS

## 2016-02-28 MED ORDER — EPOETIN ALFA 20000 UNIT/ML IJ SOLN
INTRAMUSCULAR | Status: AC
  Filled 2016-02-28: qty 1

## 2016-03-20 ENCOUNTER — Encounter (HOSPITAL_COMMUNITY)
Admission: RE | Admit: 2016-03-20 | Discharge: 2016-03-20 | Disposition: A | Discharge status: Home / Self Care | Payer: Medicare Other | Source: Ambulatory Visit | Attending: Nephrology | Admitting: Nephrology

## 2016-03-20 DIAGNOSIS — N185 Chronic kidney disease, stage 5: Secondary | ICD-10-CM | POA: Diagnosis not present

## 2016-03-20 DIAGNOSIS — D631 Anemia in chronic kidney disease: Secondary | ICD-10-CM | POA: Insufficient documentation

## 2016-03-20 LAB — POCT HEMOGLOBIN-HEMACUE: Hemoglobin: 10 g/dL — ABNORMAL LOW (ref 12.0–15.0)

## 2016-03-20 MED ORDER — EPOETIN ALFA 20000 UNIT/ML IJ SOLN
INTRAMUSCULAR | Status: AC
  Filled 2016-03-20: qty 1

## 2016-03-20 MED ORDER — EPOETIN ALFA 20000 UNIT/ML IJ SOLN
20000.0000 [IU] | INTRAMUSCULAR | Status: DC
  Administered 2016-03-20: 20000 [IU] via SUBCUTANEOUS

## 2016-03-28 ENCOUNTER — Ambulatory Visit: Payer: Medicare Other

## 2016-04-10 DIAGNOSIS — N2581 Secondary hyperparathyroidism of renal origin: Secondary | ICD-10-CM | POA: Diagnosis not present

## 2016-04-10 DIAGNOSIS — D631 Anemia in chronic kidney disease: Secondary | ICD-10-CM | POA: Diagnosis not present

## 2016-04-10 DIAGNOSIS — I120 Hypertensive chronic kidney disease with stage 5 chronic kidney disease or end stage renal disease: Secondary | ICD-10-CM | POA: Diagnosis not present

## 2016-04-10 DIAGNOSIS — Z23 Encounter for immunization: Secondary | ICD-10-CM | POA: Diagnosis not present

## 2016-04-10 DIAGNOSIS — N185 Chronic kidney disease, stage 5: Secondary | ICD-10-CM | POA: Diagnosis not present

## 2016-04-11 ENCOUNTER — Encounter (HOSPITAL_COMMUNITY)
Admission: RE | Admit: 2016-04-11 | Discharge: 2016-04-11 | Disposition: A | Discharge status: Home / Self Care | Payer: Medicare Other | Source: Ambulatory Visit | Attending: Nephrology | Admitting: Nephrology

## 2016-04-11 DIAGNOSIS — N185 Chronic kidney disease, stage 5: Secondary | ICD-10-CM | POA: Insufficient documentation

## 2016-04-11 DIAGNOSIS — D631 Anemia in chronic kidney disease: Secondary | ICD-10-CM | POA: Insufficient documentation

## 2016-04-11 LAB — POCT HEMOGLOBIN-HEMACUE: Hemoglobin: 10 g/dL — ABNORMAL LOW (ref 12.0–15.0)

## 2016-04-11 LAB — IRON AND TIBC
IRON: 75 ug/dL (ref 28–170)
SATURATION RATIOS: 34 % — AB (ref 10.4–31.8)
TIBC: 218 ug/dL — AB (ref 250–450)
UIBC: 143 ug/dL

## 2016-04-11 LAB — FERRITIN: FERRITIN: 300 ng/mL (ref 11–307)

## 2016-04-11 MED ORDER — EPOETIN ALFA 20000 UNIT/ML IJ SOLN
20000.0000 [IU] | INTRAMUSCULAR | Status: DC
  Administered 2016-04-11: 20000 [IU] via SUBCUTANEOUS

## 2016-04-11 MED ORDER — EPOETIN ALFA 20000 UNIT/ML IJ SOLN
INTRAMUSCULAR | Status: AC
  Filled 2016-04-11: qty 1

## 2016-04-12 ENCOUNTER — Ambulatory Visit: Payer: Medicare Other

## 2016-04-12 ENCOUNTER — Encounter: Payer: Self-pay | Admitting: Internal Medicine

## 2016-04-15 ENCOUNTER — Encounter: Payer: Self-pay | Admitting: Internal Medicine

## 2016-05-01 ENCOUNTER — Other Ambulatory Visit (HOSPITAL_COMMUNITY): Payer: Self-pay | Admitting: *Deleted

## 2016-05-02 ENCOUNTER — Encounter (HOSPITAL_COMMUNITY): Payer: Self-pay

## 2016-05-02 ENCOUNTER — Encounter (HOSPITAL_COMMUNITY)
Admission: RE | Admit: 2016-05-02 | Discharge: 2016-05-02 | Disposition: A | Discharge status: Home / Self Care | Payer: Medicare Other | Source: Ambulatory Visit | Attending: Nephrology | Admitting: Nephrology

## 2016-05-02 DIAGNOSIS — D631 Anemia in chronic kidney disease: Secondary | ICD-10-CM | POA: Diagnosis not present

## 2016-05-02 DIAGNOSIS — N185 Chronic kidney disease, stage 5: Secondary | ICD-10-CM

## 2016-05-02 LAB — POCT HEMOGLOBIN-HEMACUE: Hemoglobin: 10.3 g/dL — ABNORMAL LOW (ref 12.0–15.0)

## 2016-05-02 MED ORDER — EPOETIN ALFA 20000 UNIT/ML IJ SOLN
20000.0000 [IU] | INTRAMUSCULAR | Status: DC
  Administered 2016-05-02: 20000 [IU] via SUBCUTANEOUS

## 2016-05-02 MED ORDER — EPOETIN ALFA 20000 UNIT/ML IJ SOLN
INTRAMUSCULAR | Status: AC
  Administered 2016-05-02: 20000 [IU] via SUBCUTANEOUS
  Filled 2016-05-02: qty 1

## 2016-05-08 ENCOUNTER — Other Ambulatory Visit: Payer: Self-pay | Admitting: *Deleted

## 2016-05-08 NOTE — Telephone Encounter (Signed)
Also requesting refill on Calcium acetate 667 mg cap take 2 caps 3 times a day.

## 2016-05-09 MED ORDER — FAMOTIDINE 40 MG PO TABS: 40.0000 mg | ORAL_TABLET | Freq: Every day | ORAL | 3 refills | Status: DC

## 2016-05-21 ENCOUNTER — Observation Stay (HOSPITAL_COMMUNITY)
Admission: EM | Admit: 2016-05-21 | Discharge: 2016-05-22 | Disposition: A | Discharge status: Home / Self Care | Payer: Medicare Other | Attending: Internal Medicine | Admitting: Internal Medicine

## 2016-05-21 ENCOUNTER — Emergency Department (HOSPITAL_COMMUNITY): Payer: Medicare Other

## 2016-05-21 ENCOUNTER — Other Ambulatory Visit: Payer: Self-pay | Admitting: Internal Medicine

## 2016-05-21 ENCOUNTER — Encounter (HOSPITAL_COMMUNITY): Payer: Self-pay | Admitting: Emergency Medicine

## 2016-05-21 DIAGNOSIS — I10 Essential (primary) hypertension: Secondary | ICD-10-CM | POA: Diagnosis present

## 2016-05-21 DIAGNOSIS — N185 Chronic kidney disease, stage 5: Secondary | ICD-10-CM | POA: Insufficient documentation

## 2016-05-21 DIAGNOSIS — R079 Chest pain, unspecified: Secondary | ICD-10-CM | POA: Diagnosis not present

## 2016-05-21 DIAGNOSIS — Z8673 Personal history of transient ischemic attack (TIA), and cerebral infarction without residual deficits: Secondary | ICD-10-CM | POA: Diagnosis not present

## 2016-05-21 DIAGNOSIS — R0789 Other chest pain: Secondary | ICD-10-CM | POA: Diagnosis not present

## 2016-05-21 DIAGNOSIS — E872 Acidosis, unspecified: Secondary | ICD-10-CM | POA: Diagnosis present

## 2016-05-21 DIAGNOSIS — N186 End stage renal disease: Secondary | ICD-10-CM | POA: Diagnosis present

## 2016-05-21 DIAGNOSIS — I120 Hypertensive chronic kidney disease with stage 5 chronic kidney disease or end stage renal disease: Secondary | ICD-10-CM | POA: Insufficient documentation

## 2016-05-21 DIAGNOSIS — I350 Nonrheumatic aortic (valve) stenosis: Secondary | ICD-10-CM

## 2016-05-21 DIAGNOSIS — D631 Anemia in chronic kidney disease: Secondary | ICD-10-CM | POA: Diagnosis present

## 2016-05-21 DIAGNOSIS — N189 Chronic kidney disease, unspecified: Secondary | ICD-10-CM

## 2016-05-21 DIAGNOSIS — Z79899 Other long term (current) drug therapy: Secondary | ICD-10-CM | POA: Diagnosis not present

## 2016-05-21 DIAGNOSIS — I700 Atherosclerosis of aorta: Secondary | ICD-10-CM | POA: Diagnosis present

## 2016-05-21 DIAGNOSIS — E785 Hyperlipidemia, unspecified: Secondary | ICD-10-CM | POA: Diagnosis present

## 2016-05-21 HISTORY — DX: Nonrheumatic aortic (valve) stenosis: I35.0

## 2016-05-21 LAB — CBC
HCT: 29.7 % — ABNORMAL LOW (ref 36.0–46.0)
Hemoglobin: 9.1 g/dL — ABNORMAL LOW (ref 12.0–15.0)
MCH: 28.1 pg (ref 26.0–34.0)
MCHC: 30.6 g/dL (ref 30.0–36.0)
MCV: 91.7 fL (ref 78.0–100.0)
PLATELETS: 159 10*3/uL (ref 150–400)
RBC: 3.24 MIL/uL — AB (ref 3.87–5.11)
RDW: 16.3 % — ABNORMAL HIGH (ref 11.5–15.5)
WBC: 3.7 10*3/uL — AB (ref 4.0–10.5)

## 2016-05-21 LAB — I-STAT TROPONIN, ED
TROPONIN I, POC: 0 ng/mL (ref 0.00–0.08)
Troponin i, poc: 0 ng/mL (ref 0.00–0.08)

## 2016-05-21 LAB — BASIC METABOLIC PANEL
ANION GAP: 10 (ref 5–15)
BUN: 92 mg/dL — ABNORMAL HIGH (ref 6–20)
CALCIUM: 8.5 mg/dL — AB (ref 8.9–10.3)
CO2: 16 mmol/L — AB (ref 22–32)
CREATININE: 8.18 mg/dL — AB (ref 0.44–1.00)
Chloride: 112 mmol/L — ABNORMAL HIGH (ref 101–111)
GFR, EST AFRICAN AMERICAN: 5 mL/min — AB (ref >60)
GFR, EST NON AFRICAN AMERICAN: 4 mL/min — AB (ref >60)
Glucose, Bld: 129 mg/dL — ABNORMAL HIGH (ref 65–99)
Potassium: 4.3 mmol/L (ref 3.5–5.1)
SODIUM: 138 mmol/L (ref 135–145)

## 2016-05-21 MED ORDER — CARVEDILOL 12.5 MG PO TABS
12.5000 mg | ORAL_TABLET | Freq: Two times a day (BID) | ORAL | Status: DC
  Administered 2016-05-22: 12.5 mg via ORAL
  Filled 2016-05-21: qty 1

## 2016-05-21 MED ORDER — HEPARIN SODIUM (PORCINE) 5000 UNIT/ML IJ SOLN
5000.0000 [IU] | Freq: Three times a day (TID) | INTRAMUSCULAR | Status: DC
  Administered 2016-05-22: 5000 [IU] via SUBCUTANEOUS
  Filled 2016-05-21: qty 1

## 2016-05-21 MED ORDER — SODIUM BICARBONATE 650 MG PO TABS
650.0000 mg | ORAL_TABLET | Freq: Two times a day (BID) | ORAL | Status: DC
  Administered 2016-05-22: 650 mg via ORAL
  Filled 2016-05-21: qty 1

## 2016-05-21 MED ORDER — GI COCKTAIL ~~LOC~~: 30.0000 mL | Freq: Four times a day (QID) | ORAL | Status: DC | PRN

## 2016-05-21 MED ORDER — AMLODIPINE BESYLATE 10 MG PO TABS
10.0000 mg | ORAL_TABLET | Freq: Every day | ORAL | Status: DC
  Administered 2016-05-22: 10 mg via ORAL
  Filled 2016-05-21: qty 1

## 2016-05-21 MED ORDER — CALCITRIOL 0.25 MCG PO CAPS
0.2500 ug | ORAL_CAPSULE | Freq: Every day | ORAL | Status: DC
  Administered 2016-05-22: 0.25 ug via ORAL
  Filled 2016-05-21: qty 1

## 2016-05-21 MED ORDER — DOCUSATE SODIUM 100 MG PO CAPS: 100.0000 mg | ORAL_CAPSULE | Freq: Every day | ORAL | Status: DC | PRN

## 2016-05-21 MED ORDER — FAMOTIDINE 20 MG PO TABS
40.0000 mg | ORAL_TABLET | Freq: Every day | ORAL | Status: DC
  Administered 2016-05-22: 40 mg via ORAL
  Filled 2016-05-21: qty 2

## 2016-05-21 MED ORDER — CINACALCET HCL 30 MG PO TABS
60.0000 mg | ORAL_TABLET | Freq: Every day | ORAL | Status: DC
  Administered 2016-05-22: 60 mg via ORAL
  Filled 2016-05-21: qty 2

## 2016-05-21 MED ORDER — FUROSEMIDE 40 MG PO TABS
40.0000 mg | ORAL_TABLET | Freq: Every day | ORAL | Status: DC
  Administered 2016-05-22: 40 mg via ORAL
  Filled 2016-05-21: qty 1

## 2016-05-21 MED ORDER — POLYETHYLENE GLYCOL 3350 17 G PO PACK
17.0000 g | PACK | Freq: Every day | ORAL | Status: DC
  Administered 2016-05-22: 17 g via ORAL
  Filled 2016-05-21: qty 1

## 2016-05-21 MED ORDER — ONDANSETRON HCL 4 MG/2ML IJ SOLN
4.0000 mg | Freq: Four times a day (QID) | INTRAMUSCULAR | Status: DC | PRN
  Administered 2016-05-22: 4 mg via INTRAVENOUS
  Filled 2016-05-21: qty 2

## 2016-05-21 MED ORDER — SEVELAMER CARBONATE 800 MG PO TABS
800.0000 mg | ORAL_TABLET | Freq: Every day | ORAL | Status: DC
  Administered 2016-05-22: 800 mg via ORAL
  Filled 2016-05-21: qty 1

## 2016-05-21 MED ORDER — ACETAMINOPHEN 325 MG PO TABS
650.0000 mg | ORAL_TABLET | Freq: Three times a day (TID) | ORAL | Status: DC | PRN
  Administered 2016-05-21: 650 mg via ORAL
  Filled 2016-05-21: qty 2

## 2016-05-21 MED ORDER — CALCIUM ACETATE (PHOS BINDER) 667 MG PO CAPS
1334.0000 mg | ORAL_CAPSULE | Freq: Every day | ORAL | Status: DC
  Administered 2016-05-22: 1334 mg via ORAL
  Filled 2016-05-21: qty 2
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
## 2016-05-21 NOTE — ED Triage Notes (Signed)
Pt states while at home she started having substernal chest pressure that is 10/10 with no sob diaphoresis or other symptoms at this time. Pt arrives by [facility] and received 324 mg ASA en route.

## 2016-05-21 NOTE — H&P (Signed)
Date: 05/21/2016               Patient Name:  Nancy [name] MRN: [id number]  DOB: [birth year] Age / Sex: 75 y.o., female   PCP: Sid Falcon, MD         Medical Service: Internal Medicine Teaching Service         Attending Physician: Dr. Lucious Groves, DO    First Contact: Dr. Asencion Partridge, MD Pager: [phone]  Second Contact: Dr. Burgess Estelle, MD Pager: [phone]       After Hours (After 5p/  First Contact Pager: [phone]  weekends / holidays): Second Contact Pager: [phone]   Chief Complaint: Chest pressure  History of Present Illness:  This is a 75 y/o F with Mhx including ESRD 2/2 HTN who refuses HD, HTN, HLD, CVA and anemia of chronic disease who presents for evaluation of chest pressure. Patient reports onset of substernal chest pressure that began this morning around 10 am while she was washing dishes. She took a drink of cold water just prior to onset of chest pressure. She reported associated nausea and diaphoresis. Patient reports the chest pressure was constant until she got in the ambulance and received ASA. Currently pain-free however reports she developed some chest pressure while sitting upright in the ED. Reports 1 similar prior episode this Sunday which lasted approximately 1 hour. Just prior to this, she drank a glass of cold water.   In the ED, VSS (temp 97.7, pulse 80, respirations 13 and was saturating 98% on RA, BP 130/60). CXR without active cardiopulmonary disease. EKG shows NSR without evidence for STEMI or TWI. Troponin negative x2. BMET significant for Cr 8.18 (baseline) and GFR of 4 (baseline). CBC shows leukopenia (3.4) and chronic anemia (9.1).   Meds:  Current Meds  Medication Sig  . acetaminophen (TYLENOL) 500 MG tablet Take 1,000 mg by mouth every 8 (eight) hours as needed for mild pain, moderate pain, fever or headache.   Marland Kitchen amLODipine (NORVASC) 10 MG tablet Take 1 tablet (10 mg total) by mouth daily.  . calcitRIOL (ROCALTROL) 0.25 MCG capsule  Take 0.25 mcg by mouth daily.  . calcium acetate (PHOSLO) 667 MG capsule Take 2 capsules by mouth daily.  . carvedilol (COREG) 12.5 MG tablet Take 12.5 mg by mouth 2 (two) times daily with a meal.  . docusate sodium 100 MG CAPS Take 100 mg by mouth daily. (Patient taking differently: Take 100 mg by mouth daily as needed (FOR CONSTIPATION). )  . famotidine (PEPCID) 40 MG tablet Take 1 tablet (40 mg total) by mouth daily.  . furosemide (LASIX) 40 MG tablet Take 1 tablet (40 mg total) by mouth daily.  . polyethylene glycol (MIRALAX / GLYCOLAX) packet Take 17 g by mouth daily.  Marland Kitchen RENVELA 800 MG tablet Take 800 mg by mouth daily.  . SENSIPAR 60 MG tablet Take 60 mg by mouth daily.  . sodium bicarbonate 650 MG tablet Take 1 tablet (650 mg total) by mouth 2 (two) times daily. (Patient taking differently: Take 650 mg by mouth daily. )   Allergies: Allergies as of 05/21/2016 - Review Complete 05/21/2016  Allergen Reaction Noted  . Hydrocodone-acetaminophen Nausea And Vomiting and Other (See Comments)    Past Medical History:  Diagnosis Date  . Bacterial sinusitis 09/17/2011  . CKD (chronic kidney disease) stage 4, GFR 15-29 ml/min ([place]) 08/11/2006   Cr continues to increase. Proteinuria on UA 02/10/12.    . Colitis   .  CVA (cerebrovascular accident) [facility])    New hemorrhagic per CT scan '09  . Diverticulosis of colon   . Dysfunctional uterine bleeding   . Fecal impaction ([place])   . Headache(784.0)   . HERNIORRHAPHY, HX OF 08/11/2006  . Hypertension   . OA (osteoarthritis)    bilateral knees  . Postmenopausal   . Pulmonary nodule   . TINEA CRURIS 01/12/2007   Family History:  Mother: Deceased 2/2 MI, Hx CHF  Social History: Never smoked. Denied any alcohol or drug use.   Review of Systems: A complete ROS was negative except as per HPI.   Physical Exam: Blood pressure 126/71, pulse 78, temperature 97.5 F (36.4 C), temperature source Oral, resp. rate 16, height 5\' 2"  (1.575 m), weight 160 lb  (72.6 kg), SpO2 100 %. General: Very pleasant elderly african american female resting comfortably in bed. In no acute distress.  HENT: Mucous membranes moist. Oropharynx clear. No periorbital edema. Cardiovascular: Regular rate and rhythm. 2/6 systolic ejection murmur, auscultated throughout. Pulmonary: CTA BL, no wheezing or crackles. Breathing unlabored.  Abdomen: Soft, non-tender and not distended. No guarding or rigidity. Normoactive bowel sounds present. Extremities: No peripheral edema. Intact distal pulses. Skin: warm, dry. No skin changes suggestive of chronic venous insufficiency.   EKG: NSR rate 70. No ST segment elevation or TWI.   CXR: No active cardiopulmonary disease.   Assessment & Plan by Problem: Active Problems:   Hyperlipidemia   Essential hypertension   CVA (cerebrovascular accident) ([place])   CKD (chronic kidney disease) stage 5, GFR less than 15 ml/min ([facility])   Chest pain   Atherosclerosis of aorta ([facility])   Anemia of chronic disease   Aortic stenosis  Chest Pressure Patient here with a 1 day history of persistent chest pressure with onset just after drinking cold water. Reports similar episode this Sunday after drinking cold water however subsided in 1 hour. She has several ACS RF's including HTN, HLD, FHx and ESRD prompting her admission and w/u. Troponins negative x2. EKG without ST seg changes or TWI.  -Normal stress test in 2014 -Most recent ECHO (2014): Normal EF with mild LVH. Mild aortic stenosis. Doubt worsening of AS at this point, patient reports great functional capacity at home.  -Troponin negative x3. -Repeat EKG in AM unchanged from priors -repeat CBC and bmet in AM  ESRD Not on HD She see's [place], Dr. Mercy Moore. Review of most recent note from 04/10/16 shows that patient is in denial about her Stage V kidney failure and refuses HD. BMET from that time similar to current labs. She takes Lasix 40 mg for volume control. On  Rensela/Sevelamer (phosphate binder). Takes PO sodium bicarb. -Sensipar  -Sevelamer -Calcitriol -PO Lasix 40 mg -Phoslo -PO sodium bicarb  HTN Pt on Amlodipine 10 mg, Coreg 12.5 mg daily (chart lists BID however pt refuses). Chart review shows good control of BP with current regimen. -Home BP meds restarted  HLD Most recent fasting lipid panel shows total cholesterol 144, LDL 78, TG 106. Does not appear to be on statin presently. Pt was on Atorvastatin 80mg  in 2014 however does not recall being on this med.  -Repeat lipid panel  Anemia of Chronic Disease 2/2 ESRD. On Procrit Q3wks per nephrologist. No active bleeding.  -Pt reports due for Procrit injection tomorrow  Aortic Atherosclerosis Noted on CXR. Not new.   Diet: HH, was made NPO at midnight IVF: SNL Code Status: DNR DVT Prophylaxis: Heparin  Dispo: Admit patient to Observation with  expected length of stay less than 2 midnights.  SignedEinar Gip, DO 05/21/2016, 7:23 PM  Pager: [phone]

## 2016-05-21 NOTE — Telephone Encounter (Signed)
I do not have her as being on this medication.  I have her on calcitriol.  She needs to call the nephrologist she sees.  Thanks.

## 2016-05-22 ENCOUNTER — Encounter (HOSPITAL_COMMUNITY): Payer: Medicare Other

## 2016-05-22 ENCOUNTER — Encounter (HOSPITAL_COMMUNITY): Payer: Self-pay | Admitting: General Practice

## 2016-05-22 DIAGNOSIS — Z8673 Personal history of transient ischemic attack (TIA), and cerebral infarction without residual deficits: Secondary | ICD-10-CM | POA: Diagnosis not present

## 2016-05-22 DIAGNOSIS — R0789 Other chest pain: Principal | ICD-10-CM

## 2016-05-22 DIAGNOSIS — Z885 Allergy status to narcotic agent status: Secondary | ICD-10-CM

## 2016-05-22 DIAGNOSIS — I120 Hypertensive chronic kidney disease with stage 5 chronic kidney disease or end stage renal disease: Secondary | ICD-10-CM

## 2016-05-22 DIAGNOSIS — Z8249 Family history of ischemic heart disease and other diseases of the circulatory system: Secondary | ICD-10-CM

## 2016-05-22 DIAGNOSIS — Z66 Do not resuscitate: Secondary | ICD-10-CM

## 2016-05-22 DIAGNOSIS — N185 Chronic kidney disease, stage 5: Secondary | ICD-10-CM

## 2016-05-22 DIAGNOSIS — I350 Nonrheumatic aortic (valve) stenosis: Secondary | ICD-10-CM

## 2016-05-22 DIAGNOSIS — E785 Hyperlipidemia, unspecified: Secondary | ICD-10-CM

## 2016-05-22 DIAGNOSIS — D631 Anemia in chronic kidney disease: Secondary | ICD-10-CM | POA: Diagnosis not present

## 2016-05-22 DIAGNOSIS — Z79899 Other long term (current) drug therapy: Secondary | ICD-10-CM

## 2016-05-22 DIAGNOSIS — E872 Acidosis, unspecified: Secondary | ICD-10-CM | POA: Diagnosis present

## 2016-05-22 DIAGNOSIS — I700 Atherosclerosis of aorta: Secondary | ICD-10-CM

## 2016-05-22 LAB — RAPID URINE DRUG SCREEN, HOSP PERFORMED
Amphetamines: NOT DETECTED
BARBITURATES: NOT DETECTED
BENZODIAZEPINES: NOT DETECTED
Cocaine: NOT DETECTED
Opiates: NOT DETECTED
Tetrahydrocannabinol: NOT DETECTED

## 2016-05-22 LAB — RENAL FUNCTION PANEL
ALBUMIN: 3 g/dL — AB (ref 3.5–5.0)
ANION GAP: 10 (ref 5–15)
BUN: 91 mg/dL — ABNORMAL HIGH (ref 6–20)
CALCIUM: 8.5 mg/dL — AB (ref 8.9–10.3)
CO2: 17 mmol/L — AB (ref 22–32)
Chloride: 112 mmol/L — ABNORMAL HIGH (ref 101–111)
Creatinine, Ser: 8.4 mg/dL — ABNORMAL HIGH (ref 0.44–1.00)
GFR calc non Af Amer: 4 mL/min — ABNORMAL LOW (ref >60)
GFR, EST AFRICAN AMERICAN: 5 mL/min — AB (ref >60)
GLUCOSE: 93 mg/dL (ref 65–99)
PHOSPHORUS: 6.8 mg/dL — AB (ref 2.5–4.6)
POTASSIUM: 4.4 mmol/L (ref 3.5–5.1)
Sodium: 139 mmol/L (ref 135–145)

## 2016-05-22 LAB — LIPID PANEL
CHOL/HDL RATIO: 2.8 ratio
CHOLESTEROL: 102 mg/dL (ref 0–200)
HDL: 37 mg/dL — ABNORMAL LOW (ref >40)
LDL CALC: 55 mg/dL (ref 0–99)
TRIGLYCERIDES: 48 mg/dL (ref <150)
VLDL: 10 mg/dL (ref 0–40)

## 2016-05-22 LAB — CBC
HEMATOCRIT: 27.1 % — AB (ref 36.0–46.0)
HEMOGLOBIN: 8.6 g/dL — AB (ref 12.0–15.0)
MCH: 28.8 pg (ref 26.0–34.0)
MCHC: 31.7 g/dL (ref 30.0–36.0)
MCV: 90.6 fL (ref 78.0–100.0)
Platelets: 157 10*3/uL (ref 150–400)
RBC: 2.99 MIL/uL — AB (ref 3.87–5.11)
RDW: 16.6 % — ABNORMAL HIGH (ref 11.5–15.5)
WBC: 4.2 10*3/uL (ref 4.0–10.5)

## 2016-05-22 LAB — TROPONIN I
Troponin I: <0.03 ng/mL (ref <0.03)
Troponin I: <0.03 ng/mL (ref <0.03)

## 2016-05-22 MED ORDER — DICLOFENAC SODIUM 1 % TD GEL: 2.0000 g | Freq: Four times a day (QID) | TRANSDERMAL | 1 refills | Status: DC | PRN

## 2016-05-22 MED ORDER — SODIUM BICARBONATE 650 MG PO TABS: 650.0000 mg | ORAL_TABLET | Freq: Two times a day (BID) | ORAL | 0 refills | Status: DC

## 2016-05-22 MED ORDER — DICLOFENAC SODIUM 1 % TD GEL
2.0000 g | Freq: Four times a day (QID) | TRANSDERMAL | Status: DC
  Administered 2016-05-22: 2 g via TOPICAL
  Filled 2016-05-22: qty 100

## 2016-05-22 NOTE — Discharge Instructions (Signed)
° °  Chest Wall Pain Introduction Chest wall pain is pain in or around the bones and muscles of your chest. Sometimes, an injury causes this pain. Sometimes, the cause may not be known. This pain may take several weeks or longer to get better. Follow these instructions at home: Pay attention to any changes in your symptoms. Take these actions to help with your pain:  Rest as told by your doctor.  Avoid activities that cause pain. Try not to use your chest, belly (abdominal), or side muscles to lift heavy things.  If directed, apply ice to the painful area:  Put ice in a plastic bag.  Place a towel between your skin and the bag.  Leave the ice on for 20 minutes, 2-3 times per day.  Take over-the-counter and prescription medicines only as told by your doctor.  Do not use tobacco products, including cigarettes, chewing tobacco, and e-cigarettes. If you need help quitting, ask your doctor.  Keep all follow-up visits as told by your doctor. This is important. Contact a doctor if:  You have a fever.  Your chest pain gets worse.  You have new symptoms. Get help right away if:  You feel sick to your stomach (nauseous) or you throw up (vomit).  You feel sweaty or light-headed.  You have a cough with phlegm (sputum) or you cough up blood.  You are short of breath. This information is not intended to replace advice given to you by your health care provider. Make sure you discuss any questions you have with your health care provider. Document Released: 12/11/2007 Document Revised: 11/30/2015 Document Reviewed: 09/19/2014  2017 Elsevier Chest Wall Pain Introduction Chest wall pain is pain in or around the bones and muscles of your chest. Sometimes, an injury causes this pain. Sometimes, the cause may not be known. This pain may take several weeks or longer to get better. Follow these instructions at home: Pay attention to any changes in your symptoms. Take these actions to help with  your pain:  Rest as told by your doctor.  Avoid activities that cause pain. Try not to use your chest, belly (abdominal), or side muscles to lift heavy things.  If directed, apply ice to the painful area:  Put ice in a plastic bag.  Place a towel between your skin and the bag.  Leave the ice on for 20 minutes, 2-3 times per day.  Take over-the-counter and prescription medicines only as told by your doctor.  Do not use tobacco products, including cigarettes, chewing tobacco, and e-cigarettes. If you need help quitting, ask your doctor.  Keep all follow-up visits as told by your doctor. This is important. Contact a doctor if:  You have a fever.  Your chest pain gets worse.  You have new symptoms. Get help right away if:  You feel sick to your stomach (nauseous) or you throw up (vomit).  You feel sweaty or light-headed.  You have a cough with phlegm (sputum) or you cough up blood.  You are short of breath. This information is not intended to replace advice given to you by your health care provider. Make sure you discuss any questions you have with your health care provider. Document Released: 12/11/2007 Document Revised: 11/30/2015 Document Reviewed: 09/19/2014  2017 Elsevier

## 2016-05-22 NOTE — Progress Notes (Signed)
Pt given ice water per MD request, denies chest pain or discomfort after drinking, resting comfortably at present.  Edward Qualia RN

## 2016-05-22 NOTE — Progress Notes (Signed)
Subjective: Experienced mild left sided chest pain in the early morning, lasted 5 minutes without SOB, nausea, diaphoresis. Troponins negative x4, EKG no change. Was made NPO at midnight for possible stress test / further workup but cancelled, as patient with CKD5 with strongly-expressed desire to avoid HD unless absolutely necessary (so unlikely cath would be done if positive stress test). Patient is tender to palpation over left chest wall and again reported this chest pain seems to occur immediately after drinking cold liquids - will try and reproduce this and provide voltaren gel.  Objective: Vital signs in last 24 hours: Vitals:   05/21/16 2000 05/21/16 2026 05/22/16 0500 05/22/16 0728  BP: 131/69 (!) 142/61 (!) 130/57 138/63  Pulse: 77 81 91 85  Resp:  16 16 18   Temp:  97.7 F (36.5 C) 98.1 F (36.7 C) 98.8 F (37.1 C)  TempSrc:  Oral Oral Oral  SpO2: 100% 100% 100% 100%  Weight:  71 kg (156 lb 9.6 oz) 72.8 kg (160 lb 6.4 oz)   Height:  5\' 5"  (1.651 m)      Intake/Output Summary (Last 24 hours) at 05/22/16 0856 Last data filed at 05/22/16 0600  Gross per 24 hour  Intake                0 ml  Output                0 ml  Net                0 ml   Physical Exam General: Very pleasant elderly african [place] female resting comfortably in bed. In no acute distress.  HENT: Mucous membranes moist. Oropharynx clear. Cardiovascular: RRR. 2/6 systolic ejection murmur heard throughout. Pulmonary/Chest: CTAB, no wheezing or crackles. Breathing unlabored. Moderately TTP over left upper chest wall Abdomen: Soft, non-tender and not distended. Extremities: No peripheral edema. Intact distal pulses. Skin: warm, dry.  Psych: Appropriate affect    Labs / Imaging / Procedures: CBC Latest Ref Rng & Units 05/22/2016 05/21/2016 05/02/2016  WBC 4.0 - 10.5 K/uL 4.2 3.7(L) -  Hemoglobin 12.0 - 15.0 g/dL 8.6(L) 9.1(L) 10.3(L)  Hematocrit 36.0 - 46.0 % 27.1(L) 29.7(L) -  Platelets 150 - 400  K/uL 157 159 -   BMP Latest Ref Rng & Units 05/22/2016 05/21/2016 12/06/2015  Glucose 65 - 99 mg/dL 93 129(H) 89  BUN 6 - 20 mg/dL 91(H) 92(H) 67(H)  Creatinine 0.44 - 1.00 mg/dL 8.40(H) 8.18(H) 7.88(H)  Sodium 135 - 145 mmol/L 139 138 138  Potassium 3.5 - 5.1 mmol/L 4.4 4.3 4.1  Chloride 101 - 111 mmol/L 112(H) 112(H) 109  CO2 22 - 32 mmol/L 17(L) 16(L) 17(L)  Calcium 8.9 - 10.3 mg/dL 8.5(L) 8.5(L) 9.1   Lipid Panel     Component Value Date/Time   CHOL 102 05/22/2016 0536   TRIG 48 05/22/2016 0536   HDL 37 (L) 05/22/2016 0536   CHOLHDL 2.8 05/22/2016 0536   VLDL 10 05/22/2016 0536   LDLCALC 55 05/22/2016 0536   Dg Chest 2 View  Result Date: 05/21/2016 CLINICAL DATA:  Hervey Ard left-sided chest pain. EXAM: CHEST  2 VIEW COMPARISON:  11/17/2014 FINDINGS: The heart size and mediastinal contours are within normal limits. Aortic atherosclerosis. Both lungs are clear. No evidence of pneumothorax or pleural effusion. The visualized skeletal structures are unremarkable. IMPRESSION: No active cardiopulmonary disease.  Aortic atherosclerosis. Electronically Signed   By: Earle Gell M.D.   On: 05/21/2016 13:45    Assessment/Plan:  [name] is a 75 y.o. woman with PMH HTN, HLD, CVA, CKD5, AoCD, AS admitted for chest pain workup.   Active Problems:   Hyperlipidemia   Essential hypertension   CVA (cerebrovascular accident) ([place])   CKD (chronic kidney disease) stage 5, GFR less than 15 ml/min (HCC)   Chest pain   Atherosclerosis of aorta (HCC)   Anemia of chronic disease   Aortic stenosis  Chest Pressure Patient here with a 1 day history of persistent chest pressure with onset just after drinking cold water. Reports similar episode this Sunday after drinking cold water however subsided in 1 hour. She has several ACS RF's including HTN, HLD, FHx and ESRD prompting her admission and w/u. Troponins negative x2. EKG without ST seg changes or TWI. Likely costochondritis vs esophageal  spasm -Normal stress test in 2014 -Most recent ECHO (2014): Normal EF with mild LVH. Mild aortic stenosis. Doubt worsening of AS at this point, patient reports great functional capacity at home.  -Troponin negative x4 -Repeat EKG in AM unchanged from priors -repeat CBC and bmet in AM - unremarkable - voltaren gel QID to chest wall - cold liquid bolus challenge to see if reproducible (non-cardiac) pain  ESRD Not on HD She see's [employer], Dr. Mercy Moore. Review of most recent note from 04/10/16 shows that patient is in denial about her Stage V kidney failure and refuses HD. BMET from that time similar to current labs. She takes Lasix 40 mg for volume control. On Rensela/Sevelamer (phosphate binder). Takes PO sodium bicarb. -Sensipar  -Sevelamer -Calcitriol -PO Lasix 40 mg -Phoslo -PO sodium bicarb  HTN Pt on Amlodipine 10 mg, Coreg 12.5 mg daily (chart lists BID however pt refuses). Chart review shows good control of BP with current regimen. -Home BP meds restarted  HLD Most recent fasting lipid panel shows total cholesterol 144, LDL 78, TG 106. Does not appear to be on statin presently. Pt was on Atorvastatin 80mg  in 2014 however does not recall being on this med.  -Repeat lipid panel, LDL 55  Anemia of Chronic Disease 2/2 ESRD. On Procrit Q3wks per nephrologist. No active bleeding.  -Pt reports due for Procrit injection tomorrow  Aortic Atherosclerosis Noted on CXR. Not new.   Diet: HH, was made NPO at midnight IVF: SNL Code Status: DNR DVT Prophylaxis: Heparin Dispo: Anticipated discharge today.   LOS: 0 days   Asencion Partridge, MD 05/22/2016, 8:56 AM Pager: [phone]

## 2016-05-22 NOTE — Care Management Obs Status (Signed)
[facility] NOTIFICATION   Patient Details  Name: Nancy [name] MRN: [id number] Date of Birth: 1941/05/28   Medicare Observation Status Notification Given:  Yes    Sharin Mons, RN 05/22/2016, 11:38 AM

## 2016-05-22 NOTE — Progress Notes (Signed)
Pt discharged to home via W/C, condition stable, discharge education complete with verbal understanding of instructions, accompanied by family member.  Edward Qualia RN

## 2016-05-22 NOTE — Discharge Summary (Signed)
Name: Nancy [name] MRN: [id number] DOB: April 19, 1941 75 y.o. PCP: Sid Falcon, MD  Date of Admission: 05/21/2016  4:12 PM Date of Discharge: 05/22/2016 Attending Physician: Lucious Groves, DO  Discharge Diagnosis: 1. Costochondritis  Active Problems:   Hyperlipidemia   Essential hypertension   CVA (cerebrovascular accident) ([place])   CKD (chronic kidney disease) stage 5, GFR less than 15 ml/min (HCC)   Chest pain   Atherosclerosis of aorta (HCC)   Anemia of chronic disease   Aortic stenosis   Normal anion gap metabolic acidosis   Discharge Medications:   Medication List    STOP taking these medications   silver sulfADIAZINE 1 % cream Commonly known as:  SILVADENE     TAKE these medications   acetaminophen 500 MG tablet Commonly known as:  TYLENOL Take 1,000 mg by mouth every 8 (eight) hours as needed for mild pain, moderate pain, fever or headache.   amLODipine 10 MG tablet Commonly known as:  NORVASC Take 1 tablet (10 mg total) by mouth daily.   calcitRIOL 0.25 MCG capsule Commonly known as:  ROCALTROL Take 0.25 mcg by mouth daily.   calcium acetate 667 MG capsule Commonly known as:  PHOSLO Take 2 capsules by mouth daily.   carvedilol 12.5 MG tablet Commonly known as:  COREG Take 12.5 mg by mouth 2 (two) times daily with a meal.   COMBIGAN 0.2-0.5 % ophthalmic solution Generic drug:  brimonidine-timolol Place 1 drop into both eyes every 12 (twelve) hours. Reported on 12/11/2015   diclofenac sodium 1 % Gel Commonly known as:  VOLTAREN Apply 2 g topically 4 (four) times daily as needed.   DSS 100 MG Caps Take 100 mg by mouth daily. What changed:  when to take this  reasons to take this   famotidine 40 MG tablet Commonly known as:  PEPCID Take 1 tablet (40 mg total) by mouth daily.   furosemide 40 MG tablet Commonly known as:  LASIX Take 1 tablet (40 mg total) by mouth daily.   polyethylene glycol packet Commonly known as:  MIRALAX /  GLYCOLAX Take 17 g by mouth daily.   RENVELA 800 MG tablet Generic drug:  sevelamer carbonate Take 800 mg by mouth daily.   SENSIPAR 60 MG tablet Generic drug:  cinacalcet Take 60 mg by mouth daily.   sodium bicarbonate 650 MG tablet Take 1 tablet (650 mg total) by mouth 2 (two) times daily. What changed:  when to take this   Travoprost (BAK Free) 0.004 % Soln ophthalmic solution Commonly known as:  TRAVATAN Place 1-2 drops into both eyes 2 (two) times daily. Reported on 12/11/2015       Disposition and follow-up:   Nancy [name] was discharged from [facility] in Stable condition.  At the hospital follow up visit please address:  1.  Chest pain, likely secondary to costochondritis - assess left-sided chest wall pain, tenderness to palpation. We prescribed Voltaren gel but can also try Tylenol, heating pads, or short course of H2 blocker (pain occasionally related to drinking cold liquids / esophageal etiology)  Essential hypertension - assess BP control, goal <130/80 given her CKD5  CKD stage 5 - not willing to go on dialysis unless it becomes medically necessary, assess medication compliance and remaining renal function. Despite taking her sodium bicarb twice daily she continues to have a non-anion gap metabolic acidosis - consider increasing bicarb to TID.  2.  Labs / imaging needed at time of follow-up:  BMP  3.  Pending labs/ test needing follow-up: None  Follow-up Appointments: Follow-up Information    Gilles Chiquito, MD. Go on 05/29/2016.   Specialty:  Internal Medicine Why:  At 8:45 AM, please arrive 15 minutes early to check in. Contact information: [place] [address] [phone]           Hospital Course by problem list: Active Problems:   Hyperlipidemia   Essential hypertension   CVA (cerebrovascular accident) ([place])   CKD (chronic kidney disease) stage 5, GFR less than 15 ml/min (HCC)   Chest pain    Atherosclerosis of aorta (HCC)   Anemia of chronic disease   Aortic stenosis   Normal anion gap metabolic acidosis   1. Chest pain, likely secondary to costochondritis Nancy [name] is a 75 y/o F with PMH  CKD5, HTN, HLD, CVA and anemia of chronic disease who presented on 11/14 for evaluation of chest pressure that began while washing dishes. She reported drinking cold water just beforehand (and reported a similar episode after drinking cold water 3 days prior). The chest pressure lasted until she took Aspirin in the ambulance on the way to the hospital. She arrived hemodynamically stable, with a normal CXR, EKG, and 4x negative troponin overnight. Repeat EKG in the morning was unremarkable. She did not undergo stress as she wants to avoid dialysis unless absolutely necessary, hence cardiac cath (and risk of pushing her CKD5) would only be used for true ACS/emergency situations. She remained largely asymptomatic on 11/15, was found to have moderate chest wall tenderness to palpation, and received diclofenac gel with complete resolution of her pain. We could not recreate her symptoms with cold beverages. She was discharged home in stable condition.   Discharge Vitals:   BP (!) 151/64 (BP Location: Right Arm)   Pulse 88   Temp 98.1 F (36.7 C) (Oral)   Resp 16   Ht 5\' 5"  (1.651 m)   Wt 72.8 kg (160 lb 6.4 oz)   SpO2 100%   BMI 26.69 kg/m   Pertinent Labs, Studies, and Procedures:  BMP Latest Ref Rng & Units 05/22/2016 05/21/2016 12/06/2015  Glucose 65 - 99 mg/dL 93 129(H) 89  BUN 6 - 20 mg/dL 91(H) 92(H) 67(H)  Creatinine 0.44 - 1.00 mg/dL 8.40(H) 8.18(H) 7.88(H)  Sodium 135 - 145 mmol/L 139 138 138  Potassium 3.5 - 5.1 mmol/L 4.4 4.3 4.1  Chloride 101 - 111 mmol/L 112(H) 112(H) 109  CO2 22 - 32 mmol/L 17(L) 16(L) 17(L)  Calcium 8.9 - 10.3 mg/dL 8.5(L) 8.5(L) 9.1   Lipid Panel     Component Value Date/Time   CHOL 102 05/22/2016 0536   TRIG 48 05/22/2016 0536   HDL 37 (L) 05/22/2016 0536     CHOLHDL 2.8 05/22/2016 0536   VLDL 10 05/22/2016 0536   LDLCALC 55 05/22/2016 0536     Ref. Range 05/21/2016 23:53 05/22/2016 05:36  Troponin I Latest Ref Range: <0.03 ng/mL <0.03 <0.03   Dg Chest 2 View  Result Date: 05/21/2016 CLINICAL DATA:  Hervey Ard left-sided chest pain. EXAM: CHEST  2 VIEW COMPARISON:  11/17/2014 FINDINGS: The heart size and mediastinal contours are within normal limits. Aortic atherosclerosis. Both lungs are clear. No evidence of pneumothorax or pleural effusion. The visualized skeletal structures are unremarkable. IMPRESSION: No active cardiopulmonary disease.  Aortic atherosclerosis. Electronically Signed   By: Earle Gell M.D.   On: 05/21/2016 13:45   Discharge Instructions: Discharge Instructions    Call MD for:  difficulty breathing, headache or visual disturbances    Complete by:  As directed    Call MD for:  extreme fatigue    Complete by:  As directed    Call MD for:  persistant dizziness or light-headedness    Complete by:  As directed    Call MD for:  persistant nausea and vomiting    Complete by:  As directed    Call MD for:  severe uncontrolled pain    Complete by:  As directed    Diet - low sodium heart healthy    Complete by:  As directed    Discharge instructions    Complete by:  As directed    We think that your chest pain is secondary to inflammation in the muscles and joints in your chest wall (costochondritis). Our tests overnight have not found any acute injury or damage to your heart. Please follow up with your primary care doctor and consider seeing a cardiologist for further evaluation in the future.  We have prescribed diclofenac gel that you can apply to the painful spot on your chest up to four times daily as needed.   If you develop worsening chest pain or pressure, shortness of breath, nausea/vomiting, or dizziness / passing out please visit the nearest ER.   Increase activity slowly    Complete by:  As directed        Signed: Asencion Partridge, MD 05/22/2016, 7:12 PM   Pager: [phone]

## 2016-05-27 ENCOUNTER — Telehealth: Payer: Self-pay | Admitting: *Deleted

## 2016-05-27 NOTE — Telephone Encounter (Addendum)
Information for Prior Authorization was sent to [facility]. Awaiting decision in 72 hours.  Sander Nephew, RN  05/27/2016 9:34 AM  Response from [employer].  More information needed. Completed by Dr. Wynetta Emery and faxed to [facility]. Sharol Harness 05/30/2016 12:10 PM  Call to [facility] to check on status of patient's prescription.  Diclofenac Gel was covered by the patient's insurance .  Sander Nephew, RN 06/06/2016 10:53 AM

## 2016-05-29 ENCOUNTER — Encounter: Payer: Medicare Other | Admitting: Internal Medicine

## 2016-06-04 ENCOUNTER — Encounter (HOSPITAL_COMMUNITY)
Admission: RE | Admit: 2016-06-04 | Discharge: 2016-06-04 | Disposition: A | Discharge status: Home / Self Care | Payer: Medicare Other | Source: Ambulatory Visit | Attending: Nephrology | Admitting: Nephrology

## 2016-06-04 DIAGNOSIS — D631 Anemia in chronic kidney disease: Secondary | ICD-10-CM | POA: Insufficient documentation

## 2016-06-04 DIAGNOSIS — N185 Chronic kidney disease, stage 5: Secondary | ICD-10-CM | POA: Insufficient documentation

## 2016-06-04 LAB — POCT HEMOGLOBIN-HEMACUE: Hemoglobin: 8.8 g/dL — ABNORMAL LOW (ref 12.0–15.0)

## 2016-06-04 MED ORDER — EPOETIN ALFA 20000 UNIT/ML IJ SOLN
INTRAMUSCULAR | Status: AC
  Filled 2016-06-04: qty 1

## 2016-06-04 MED ORDER — EPOETIN ALFA 20000 UNIT/ML IJ SOLN
20000.0000 [IU] | INTRAMUSCULAR | Status: DC
  Administered 2016-06-04: 20000 [IU] via SUBCUTANEOUS

## 2016-06-05 ENCOUNTER — Telehealth: Payer: Self-pay | Admitting: *Deleted

## 2016-06-05 ENCOUNTER — Telehealth: Payer: Self-pay | Admitting: Internal Medicine

## 2016-06-05 NOTE — Telephone Encounter (Signed)
Agree 

## 2016-06-05 NOTE — Telephone Encounter (Signed)
Insurance co calls and states voltaren needs to be generic for payment, confirmed w/ dr Daryll Drown that it does not need to be brand, called pharm they state at this time it will not go through as generic or brand, they will wait appr. 1 hr and resubmit If problem continues will send to [facility]. For PA f/u

## 2016-06-05 NOTE — Telephone Encounter (Signed)
Needs Prior Authorization  For Voltaren gel for patient [employer] would need to hear back before 3 pm

## 2016-06-06 NOTE — Telephone Encounter (Signed)
Generic went through at [facility]

## 2016-06-06 NOTE — Telephone Encounter (Signed)
This has been completed.

## 2016-06-10 DIAGNOSIS — N2581 Secondary hyperparathyroidism of renal origin: Secondary | ICD-10-CM | POA: Diagnosis not present

## 2016-06-10 DIAGNOSIS — N185 Chronic kidney disease, stage 5: Secondary | ICD-10-CM | POA: Diagnosis not present

## 2016-06-10 DIAGNOSIS — D631 Anemia in chronic kidney disease: Secondary | ICD-10-CM | POA: Diagnosis not present

## 2016-06-10 DIAGNOSIS — I120 Hypertensive chronic kidney disease with stage 5 chronic kidney disease or end stage renal disease: Secondary | ICD-10-CM | POA: Diagnosis not present

## 2016-06-17 ENCOUNTER — Other Ambulatory Visit: Payer: Self-pay | Admitting: Internal Medicine

## 2016-06-21 ENCOUNTER — Emergency Department (HOSPITAL_COMMUNITY): Payer: Medicare Other

## 2016-06-21 ENCOUNTER — Emergency Department (HOSPITAL_COMMUNITY)
Admission: EM | Admit: 2016-06-21 | Discharge: 2016-06-21 | Disposition: A | Discharge status: Home / Self Care | Payer: Medicare Other | Attending: Emergency Medicine | Admitting: Emergency Medicine

## 2016-06-21 ENCOUNTER — Encounter (HOSPITAL_COMMUNITY): Payer: Self-pay

## 2016-06-21 DIAGNOSIS — R1030 Lower abdominal pain, unspecified: Secondary | ICD-10-CM | POA: Diagnosis present

## 2016-06-21 DIAGNOSIS — R9389 Abnormal findings on diagnostic imaging of other specified body structures: Secondary | ICD-10-CM

## 2016-06-21 DIAGNOSIS — K5732 Diverticulitis of large intestine without perforation or abscess without bleeding: Secondary | ICD-10-CM | POA: Diagnosis not present

## 2016-06-21 DIAGNOSIS — I120 Hypertensive chronic kidney disease with stage 5 chronic kidney disease or end stage renal disease: Secondary | ICD-10-CM | POA: Insufficient documentation

## 2016-06-21 DIAGNOSIS — N185 Chronic kidney disease, stage 5: Secondary | ICD-10-CM | POA: Diagnosis not present

## 2016-06-21 DIAGNOSIS — R935 Abnormal findings on diagnostic imaging of other abdominal regions, including retroperitoneum: Secondary | ICD-10-CM | POA: Diagnosis not present

## 2016-06-21 DIAGNOSIS — Z79899 Other long term (current) drug therapy: Secondary | ICD-10-CM | POA: Diagnosis not present

## 2016-06-21 DIAGNOSIS — Z8673 Personal history of transient ischemic attack (TIA), and cerebral infarction without residual deficits: Secondary | ICD-10-CM | POA: Insufficient documentation

## 2016-06-21 DIAGNOSIS — N2889 Other specified disorders of kidney and ureter: Secondary | ICD-10-CM | POA: Diagnosis not present

## 2016-06-21 DIAGNOSIS — K2970 Gastritis, unspecified, without bleeding: Secondary | ICD-10-CM | POA: Diagnosis not present

## 2016-06-21 DIAGNOSIS — R110 Nausea: Secondary | ICD-10-CM | POA: Diagnosis not present

## 2016-06-21 LAB — COMPREHENSIVE METABOLIC PANEL
ALBUMIN: 3.5 g/dL (ref 3.5–5.0)
ALT: 6 U/L — ABNORMAL LOW (ref 14–54)
AST: 9 U/L — AB (ref 15–41)
Alkaline Phosphatase: 66 U/L (ref 38–126)
Anion gap: 12 (ref 5–15)
BILIRUBIN TOTAL: 0.5 mg/dL (ref 0.3–1.2)
BUN: 90 mg/dL — ABNORMAL HIGH (ref 6–20)
CHLORIDE: 110 mmol/L (ref 101–111)
CO2: 18 mmol/L — ABNORMAL LOW (ref 22–32)
Calcium: 7.9 mg/dL — ABNORMAL LOW (ref 8.9–10.3)
Creatinine, Ser: 9 mg/dL — ABNORMAL HIGH (ref 0.44–1.00)
GFR calc Af Amer: 4 mL/min — ABNORMAL LOW (ref >60)
GFR, EST NON AFRICAN AMERICAN: 4 mL/min — AB (ref >60)
Glucose, Bld: 109 mg/dL — ABNORMAL HIGH (ref 65–99)
POTASSIUM: 4.1 mmol/L (ref 3.5–5.1)
Sodium: 140 mmol/L (ref 135–145)
TOTAL PROTEIN: 6.9 g/dL (ref 6.5–8.1)

## 2016-06-21 LAB — CBC WITH DIFFERENTIAL/PLATELET
BASOS ABS: 0 10*3/uL (ref 0.0–0.1)
BASOS PCT: 0 %
EOS PCT: 0 %
Eosinophils Absolute: 0 10*3/uL (ref 0.0–0.7)
HEMATOCRIT: 29.3 % — AB (ref 36.0–46.0)
Hemoglobin: 9.2 g/dL — ABNORMAL LOW (ref 12.0–15.0)
Lymphocytes Relative: 9 %
Lymphs Abs: 0.4 10*3/uL — ABNORMAL LOW (ref 0.7–4.0)
MCH: 28.7 pg (ref 26.0–34.0)
MCHC: 31.4 g/dL (ref 30.0–36.0)
MCV: 91.3 fL (ref 78.0–100.0)
MONO ABS: 0.1 10*3/uL (ref 0.1–1.0)
MONOS PCT: 3 %
NEUTROS ABS: 4.5 10*3/uL (ref 1.7–7.7)
Neutrophils Relative %: 88 %
PLATELETS: 136 10*3/uL — AB (ref 150–400)
RBC: 3.21 MIL/uL — ABNORMAL LOW (ref 3.87–5.11)
RDW: 16.6 % — AB (ref 11.5–15.5)
WBC: 5.1 10*3/uL (ref 4.0–10.5)

## 2016-06-21 LAB — URINALYSIS, ROUTINE W REFLEX MICROSCOPIC
BILIRUBIN URINE: NEGATIVE
GLUCOSE, UA: 50 mg/dL — AB
KETONES UR: NEGATIVE mg/dL
LEUKOCYTES UA: NEGATIVE
NITRITE: NEGATIVE
PH: 5 (ref 5.0–8.0)
Protein, ur: 100 mg/dL — AB
SPECIFIC GRAVITY, URINE: 1.011 (ref 1.005–1.030)

## 2016-06-21 LAB — I-STAT TROPONIN, ED
Troponin i, poc: 0 ng/mL (ref 0.00–0.08)
Troponin i, poc: 0 ng/mL (ref 0.00–0.08)

## 2016-06-21 LAB — LIPASE, BLOOD: LIPASE: 36 U/L (ref 11–51)

## 2016-06-21 MED ORDER — METRONIDAZOLE 500 MG PO TABS
500.0000 mg | ORAL_TABLET | Freq: Once | ORAL | Status: AC
  Administered 2016-06-21: 500 mg via ORAL
  Filled 2016-06-21: qty 1

## 2016-06-21 MED ORDER — METRONIDAZOLE 500 MG PO TABS: 500.0000 mg | ORAL_TABLET | Freq: Three times a day (TID) | ORAL | 0 refills | Status: AC

## 2016-06-21 MED ORDER — ONDANSETRON 4 MG PO TBDP: 4.0000 mg | ORAL_TABLET | Freq: Three times a day (TID) | ORAL | 0 refills | Status: DC | PRN

## 2016-06-21 MED ORDER — SODIUM CHLORIDE 0.9 % IV BOLUS (SEPSIS)
500.0000 mL | Freq: Once | INTRAVENOUS | Status: AC
  Administered 2016-06-21: 500 mL via INTRAVENOUS

## 2016-06-21 MED ORDER — OXYCODONE HCL 5 MG PO TABS
2.5000 mg | ORAL_TABLET | Freq: Once | ORAL | Status: AC
  Administered 2016-06-21: 2.5 mg via ORAL
  Filled 2016-06-21: qty 1

## 2016-06-21 MED ORDER — DOCUSATE SODIUM 100 MG PO CAPS: 100.0000 mg | ORAL_CAPSULE | Freq: Two times a day (BID) | ORAL | 0 refills | Status: AC

## 2016-06-21 MED ORDER — ONDANSETRON HCL 4 MG/2ML IJ SOLN
4.0000 mg | Freq: Once | INTRAMUSCULAR | Status: AC
  Administered 2016-06-21: 4 mg via INTRAVENOUS
  Filled 2016-06-21: qty 2

## 2016-06-21 MED ORDER — FENTANYL CITRATE (PF) 100 MCG/2ML IJ SOLN
50.0000 ug | Freq: Once | INTRAMUSCULAR | Status: AC
Start: 2016-06-21 — End: 2016-06-21
  Administered 2016-06-21: 50 ug via INTRAVENOUS
  Filled 2016-06-21: qty 2

## 2016-06-21 MED ORDER — CIPROFLOXACIN HCL 500 MG PO TABS: 500.0000 mg | ORAL_TABLET | ORAL | 0 refills | Status: AC

## 2016-06-21 MED ORDER — CIPROFLOXACIN IN D5W 400 MG/200ML IV SOLN
400.0000 mg | Freq: Once | INTRAVENOUS | Status: AC
  Administered 2016-06-21: 400 mg via INTRAVENOUS
  Filled 2016-06-21: qty 200

## 2016-06-21 MED ORDER — METRONIDAZOLE 500 MG PO TABS: 500.0000 mg | ORAL_TABLET | Freq: Three times a day (TID) | ORAL | Status: DC
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
## 2016-06-21 NOTE — ED Notes (Signed)
Attempted to collect urine when pt ambulate to RR, pt unable to get any in cup. Will try again with urine hat in toilet.

## 2016-06-21 NOTE — ED Notes (Signed)
Placed hat in toilet for urine, pt states she missed getting it in the hat.

## 2016-06-21 NOTE — ED Notes (Signed)
Pt being driven home by family. NAD. VSS. Ambulatory at discharge, wheeled to waiting room. Pt verbalized understanding of antibiotics and need to follow up with PCP and nephrologist.

## 2016-06-21 NOTE — ED Triage Notes (Signed)
Pt. BIB [facility] from home where husband called for pt c/o abd pain. Pt reporting to EMS 10 of 10 lower abdominal pain. Pt reports it feeling "dull like a toothache" with reports of an episode of dizziness, diaphoresis, and nausea around 9 am. Pt A&OX4. Denies trauma. Pain on palpation and movement. EMS preformed 12 lead which they state looked like a BBB.

## 2016-06-21 NOTE — ED Notes (Signed)
CT made aware pt finished with contrast.

## 2016-06-21 NOTE — ED Notes (Signed)
Pt starting 2nd bottle of contrast drink

## 2016-06-24 ENCOUNTER — Other Ambulatory Visit (HOSPITAL_COMMUNITY): Payer: Self-pay | Admitting: *Deleted

## 2016-06-25 ENCOUNTER — Ambulatory Visit (INDEPENDENT_AMBULATORY_CARE_PROVIDER_SITE_OTHER): Payer: Medicare Other | Admitting: Internal Medicine

## 2016-06-25 ENCOUNTER — Encounter (HOSPITAL_COMMUNITY)
Admission: RE | Admit: 2016-06-25 | Discharge: 2016-06-25 | Disposition: A | Discharge status: Home / Self Care | Payer: Medicare Other | Source: Ambulatory Visit | Attending: Nephrology | Admitting: Nephrology

## 2016-06-25 ENCOUNTER — Encounter: Payer: Self-pay | Admitting: Internal Medicine

## 2016-06-25 VITALS — BP 181/70 | HR 86 | Temp 97.5°F | Ht 63.0 in | Wt 154.7 lb

## 2016-06-25 DIAGNOSIS — D631 Anemia in chronic kidney disease: Secondary | ICD-10-CM | POA: Diagnosis not present

## 2016-06-25 DIAGNOSIS — N185 Chronic kidney disease, stage 5: Secondary | ICD-10-CM | POA: Insufficient documentation

## 2016-06-25 DIAGNOSIS — K5732 Diverticulitis of large intestine without perforation or abscess without bleeding: Secondary | ICD-10-CM | POA: Diagnosis not present

## 2016-06-25 LAB — POCT HEMOGLOBIN-HEMACUE: HEMOGLOBIN: 8.2 g/dL — AB (ref 12.0–15.0)

## 2016-06-25 MED ORDER — EPOETIN ALFA 20000 UNIT/ML IJ SOLN
INTRAMUSCULAR | Status: AC
  Filled 2016-06-25: qty 1

## 2016-06-25 MED ORDER — EPOETIN ALFA 20000 UNIT/ML IJ SOLN
20000.0000 [IU] | INTRAMUSCULAR | Status: DC
  Administered 2016-06-25: 09:00:00 20000 [IU] via SUBCUTANEOUS

## 2016-06-25 MED ORDER — TRAMADOL HCL 50 MG PO TABS: 50.0000 mg | ORAL_TABLET | Freq: Two times a day (BID) | ORAL | 0 refills | Status: DC | PRN

## 2016-06-25 NOTE — Assessment & Plan Note (Signed)
Assessment: Patient here for ED follow-up where she was seen for diverticulitis. She has yet to pick up her antibiotics. On exam she has tenderness to palpation in the right side of her abdomen. She is tolerating food  and denies fevers.  Plan: Patient advised to start taking antibiotics when she receives in the mail today. Given Rx for tramadol 50 mg twice a day when necessary for pain.

## 2016-06-25 NOTE — Patient Instructions (Signed)
Make sure to start taking ciprofloxacin and flagyl once you receive those medications.   You can take tramadol 50mg  twice a day as needed for pain.

## 2016-06-25 NOTE — Progress Notes (Signed)
   CC: Abdominal pain  HPI:  Nancy [name] is a 75 y.o. with past medical history as outlined below who presents to clinic for ED follow-up. She was seen in the ED on the 15th for right-sided abdominal pain. CT abdomen revealed focal diverticulitis at the junction of the descending and sigmoid colon. CBC was negative for leukocytosis. She was discharged home with Cipro and Flagyl. Patient has not taken these medications because she was waiting on them to be delivered. She states that these medications will be delivered to her house today. Denies fevers, chills, nausea, vomiting, dysuria, and decreased appetite. She has been having night sweats since abdominal pain started. She rates pain as 9 out of 10 that is cramping in nature. Pain has improved since she was seen in the ED when she rated pain as 10 out of 10. She has been taking tylenol w/o relief in pain.   Past Medical History:  Diagnosis Date  . Aortic stenosis   . Bacterial sinusitis 09/17/2011  . CKD (chronic kidney disease) stage 4, GFR 15-29 ml/min ([place]) 08/11/2006   Cr continues to increase. Proteinuria on UA 02/10/12.    . Colitis   . CVA (cerebrovascular accident) [facility])    New hemorrhagic per CT scan '09  . Diverticulosis of colon   . Dysfunctional uterine bleeding   . Fecal impaction ([place])   . Headache(784.0)   . HERNIORRHAPHY, HX OF 08/11/2006  . Hypertension   . OA (osteoarthritis)    bilateral knees  . Postmenopausal   . Pulmonary nodule   . TINEA CRURIS 01/12/2007    Review of Systems:  Pertinent review of systems noted in history of present illness. Review of system otherwise negative.  Physical Exam:  Vitals:   06/25/16 0850  BP: (!) 181/70  Pulse: 86  Temp: 97.5 F (36.4 C)  TempSrc: Oral  SpO2: 100%  Weight: 154 lb 11.2 oz (70.2 kg)  Height: 5\' 3"  (1.6 m)   Physical Exam  Constitutional:  appears well-developed and well-nourished. No distress.  HENT:  Head: Normocephalic and atraumatic.  Nose: Nose  normal.  Cardiovascular: Normal rate, regular rhythm and normal heart sounds.  Exam reveals no gallop and no friction rub.   No murmur heard. Pulmonary/Chest: Effort normal and breath sounds normal. No respiratory distress.  has no wheezes.no rales.  Abdominal: Soft. Bowel sounds are normal.  exhibits no distension. Tender to palpation of right side of abdomen. There is no rebound and no guarding.  Neurological: alert and oriented to person, place, and time.  Skin: Skin is warm and dry. No rash noted.  not diaphoretic. No erythema. No pallor.  Extremities: Negative for pedal edema.  Assessment & Plan:   See Encounters Tab for problem based charting.  Patient discussed with Dr. Daryll Drown

## 2016-07-03 NOTE — Progress Notes (Signed)
Internal Medicine Clinic Attending  Case discussed with Dr. Truong soon after the resident saw the patient.  We reviewed the resident's history and exam and pertinent patient test results.  I agree with the assessment, diagnosis, and plan of care documented in the resident's note. 

## 2016-07-09 ENCOUNTER — Encounter (HOSPITAL_COMMUNITY)
Admission: RE | Admit: 2016-07-09 | Discharge: 2016-07-09 | Disposition: A | Discharge status: Home / Self Care | Payer: Medicare Other | Source: Ambulatory Visit | Attending: Nephrology | Admitting: Nephrology

## 2016-07-09 DIAGNOSIS — D631 Anemia in chronic kidney disease: Secondary | ICD-10-CM | POA: Insufficient documentation

## 2016-07-09 DIAGNOSIS — N185 Chronic kidney disease, stage 5: Secondary | ICD-10-CM | POA: Insufficient documentation

## 2016-07-09 LAB — IRON AND TIBC
Iron: 67 ug/dL (ref 28–170)
Saturation Ratios: 38 % — ABNORMAL HIGH (ref 10.4–31.8)
TIBC: 178 ug/dL — AB (ref 250–450)
UIBC: 111 ug/dL

## 2016-07-09 LAB — FERRITIN: FERRITIN: 655 ng/mL — AB (ref 11–307)

## 2016-07-09 LAB — POCT HEMOGLOBIN-HEMACUE: HEMOGLOBIN: 7.8 g/dL — AB (ref 12.0–15.0)

## 2016-07-09 MED ORDER — EPOETIN ALFA 20000 UNIT/ML IJ SOLN
INTRAMUSCULAR | Status: AC
  Filled 2016-07-09: qty 1

## 2016-07-09 MED ORDER — EPOETIN ALFA 20000 UNIT/ML IJ SOLN
20000.0000 [IU] | INTRAMUSCULAR | Status: DC
  Administered 2016-07-09: 08:00:00 20000 [IU] via SUBCUTANEOUS

## 2016-07-09 NOTE — Progress Notes (Signed)
Called and informed Ebony, Dr. Etheleen Nicks nurse that hemocue 7.8. No new orders received. Pt received 20,000 units subcu procrit. Ebony reported that she would let Dr. Mercy Moore know.

## 2016-07-22 ENCOUNTER — Other Ambulatory Visit (HOSPITAL_COMMUNITY): Payer: Self-pay | Admitting: *Deleted

## 2016-07-23 ENCOUNTER — Encounter (HOSPITAL_COMMUNITY)
Admission: RE | Admit: 2016-07-23 | Discharge: 2016-07-23 | Disposition: A | Discharge status: Home / Self Care | Payer: Medicare Other | Source: Ambulatory Visit | Attending: Nephrology | Admitting: Nephrology

## 2016-07-23 ENCOUNTER — Other Ambulatory Visit: Payer: Self-pay | Admitting: *Deleted

## 2016-07-23 DIAGNOSIS — N185 Chronic kidney disease, stage 5: Secondary | ICD-10-CM | POA: Diagnosis not present

## 2016-07-23 DIAGNOSIS — D631 Anemia in chronic kidney disease: Secondary | ICD-10-CM | POA: Diagnosis not present

## 2016-07-23 LAB — POCT HEMOGLOBIN-HEMACUE: HEMOGLOBIN: 7.3 g/dL — AB (ref 12.0–15.0)

## 2016-07-23 MED ORDER — FAMOTIDINE 40 MG PO TABS
40.0000 mg | ORAL_TABLET | Freq: Every day | ORAL | 3 refills | Status: DC
Start: 2016-07-23 — End: 2016-10-15

## 2016-07-23 MED ORDER — EPOETIN ALFA 20000 UNIT/ML IJ SOLN
INTRAMUSCULAR | Status: AC
  Administered 2016-07-23: 09:00:00
  Filled 2016-07-23: qty 1

## 2016-07-23 MED ORDER — EPOETIN ALFA 20000 UNIT/ML IJ SOLN: 20000.0000 [IU] | INTRAMUSCULAR | Status: DC

## 2016-07-26 ENCOUNTER — Ambulatory Visit (INDEPENDENT_AMBULATORY_CARE_PROVIDER_SITE_OTHER): Payer: Medicare Other | Admitting: Podiatry

## 2016-07-26 DIAGNOSIS — L600 Ingrowing nail: Secondary | ICD-10-CM | POA: Diagnosis not present

## 2016-07-26 DIAGNOSIS — B351 Tinea unguium: Secondary | ICD-10-CM

## 2016-07-26 DIAGNOSIS — M79674 Pain in right toe(s): Secondary | ICD-10-CM

## 2016-07-26 DIAGNOSIS — M79675 Pain in left toe(s): Secondary | ICD-10-CM

## 2016-07-26 NOTE — Patient Instructions (Signed)
Ingrown Toenail An ingrown toenail occurs when the corner or sides of your toenail grow into the surrounding skin. The big toe is most commonly affected, but it can happen to any of your toes. If your ingrown toenail is not treated, you will be at risk for infection. What are the causes? This condition may be caused by:  Wearing shoes that are too small or tight.  Injury or trauma, such as stubbing your toe or having your toe stepped on.  Improper cutting or care of your toenails.  Being born with (congenital) nail or foot abnormalities, such as having a nail that is too big for your toe. What increases the risk? Risk factors for an ingrown toenail include:  Age. Your nails tend to thicken as you get older, so ingrown nails are more common in older people.  Diabetes.  Cutting your toenails incorrectly.  Blood circulation problems. What are the signs or symptoms? Symptoms may include:  Pain, soreness, or tenderness.  Redness.  Swelling.  Hardening of the skin surrounding the toe. Your ingrown toenail may be infected if there is fluid, pus, or drainage. How is this diagnosed? An ingrown toenail may be diagnosed by medical history and physical exam. If your toenail is infected, your health care provider may test a sample of the drainage. How is this treated? Treatment depends on the severity of your ingrown toenail. Some ingrown toenails may be treated at home. More severe or infected ingrown toenails may require surgery to remove all or part of the nail. Infected ingrown toenails may also be treated with antibiotic medicines. Follow these instructions at home:  If you were prescribed an antibiotic medicine, finish all of it even if you start to feel better.  Soak your foot in warm soapy water for 20 minutes, 3 times per day or as directed by your health care provider.  Carefully lift the edge of the nail away from the sore skin by wedging a small piece of cotton under the  corner of the nail. This may help with the pain. Be careful not to cause more injury to the area.  Wear shoes that fit well. If your ingrown toenail is causing you pain, try wearing sandals, if possible.  Trim your toenails regularly and carefully. Do not cut them in a curved shape. Cut your toenails straight across. This prevents injury to the skin at the corners of the toenail.  Keep your feet clean and dry.  If you are having trouble walking and are given crutches by your health care provider, use them as directed.  Do not pick at your toenail or try to remove it yourself.  Take medicines only as directed by your health care provider.  Keep all follow-up visits as directed by your health care provider. This is important. Contact a health care provider if:  Your symptoms do not improve with treatment. Get help right away if:  You have red streaks that start at your foot and go up your leg.  You have a fever.  You have increased redness, swelling, or pain.  You have fluid, blood, or pus coming from your toenail. This information is not intended to replace advice given to you by your health care provider. Make sure you discuss any questions you have with your health care provider. Document Released: 06/21/2000 Document Revised: 11/24/2015 Document Reviewed: 05/18/2014 Elsevier Interactive Patient Education  2017 [employer]  

## 2016-07-26 NOTE — Progress Notes (Signed)
   Subjective:    Patient ID: Nancy [name], female    DOB: 1940/07/21, 76 y.o.   MRN: [id number]  HPI   76 year old female presents the office if her concerns of bilateral big toenails become ingrown in the nail corners. She states that the tip of the toenails. Denies any redness or drainage. She also states her nails are thick, painful, elongated in general she cannot trim them herself may cause irritation shoe gear. She denies any swelling redness or drainage from the toenail sites. She has no other complaints today. Review of Systems  Endocrine: Positive for cold intolerance.  All other systems reviewed and are negative.      Objective:   Physical Exam  General: AAO x3, NAD  Dermatological: Nails are hypertrophic, dystrophic, brittle, discolored, elongated 10. There is incurvation of bilateral hallux medial/lateral nail borders distally. There is no tenderness along the proximal nail borders. No surrounding redness or drainage. Tenderness nails 1-5 bilaterally. No open lesions or pre-ulcerative lesions are identified today.   Vascular: Dorsalis Pedis artery and Posterior Tibial artery pedal pulses are palpable bilateral with immedate capillary fill time.  There is no pain with calf compression, swelling, warmth, erythema.   Neruologic: Grossly intact via light touch bilateral. Vibratory intact via tuning fork bilateral. Protective threshold with Semmes Wienstein monofilament intact to all pedal sites bilateral.   Musculoskeletal:  No pain, crepitus, or limitation noted with foot and ankle range of motion bilateral. Muscular strength 5/5 in all groups tested bilateral.      Assessment & Plan:  76 year old female bilateral symptomatic ingrown toenails, symptomatic onychomycosis -Treatment options discussed including all alternatives, risks, and complications -Etiology of symptoms were discussed -Nails debrided 10 without complications or bleeding. After debridement the  symptomatic portion ingrown toenails was relieved. Discussed that if symptoms continue partial nail avulsions. -Daily foot inspection -Follow-up in 3 months or sooner if any problems arise. In the meantime, encouraged to call the office with any questions, concerns, change in symptoms.   Celesta Gentile, DPM

## 2016-08-06 ENCOUNTER — Encounter (HOSPITAL_COMMUNITY)
Admission: RE | Admit: 2016-08-06 | Discharge: 2016-08-06 | Disposition: A | Discharge status: Home / Self Care | Payer: Medicare Other | Source: Ambulatory Visit | Attending: Nephrology | Admitting: Nephrology

## 2016-08-06 DIAGNOSIS — N185 Chronic kidney disease, stage 5: Secondary | ICD-10-CM | POA: Diagnosis not present

## 2016-08-06 DIAGNOSIS — D631 Anemia in chronic kidney disease: Secondary | ICD-10-CM | POA: Diagnosis not present

## 2016-08-06 MED ORDER — EPOETIN ALFA 20000 UNIT/ML IJ SOLN
20000.0000 [IU] | INTRAMUSCULAR | Status: DC
  Administered 2016-08-06: 20000 [IU] via SUBCUTANEOUS

## 2016-08-06 MED ORDER — EPOETIN ALFA 20000 UNIT/ML IJ SOLN
INTRAMUSCULAR | Status: AC
  Administered 2016-08-06: 10:00:00 20000 [IU] via SUBCUTANEOUS
  Filled 2016-08-06: qty 1

## 2016-08-07 LAB — POCT HEMOGLOBIN-HEMACUE: HEMOGLOBIN: 8.1 g/dL — AB (ref 12.0–15.0)

## 2016-08-12 DIAGNOSIS — N2581 Secondary hyperparathyroidism of renal origin: Secondary | ICD-10-CM | POA: Diagnosis not present

## 2016-08-12 DIAGNOSIS — D631 Anemia in chronic kidney disease: Secondary | ICD-10-CM | POA: Diagnosis not present

## 2016-08-12 DIAGNOSIS — I120 Hypertensive chronic kidney disease with stage 5 chronic kidney disease or end stage renal disease: Secondary | ICD-10-CM | POA: Diagnosis not present

## 2016-08-12 DIAGNOSIS — N185 Chronic kidney disease, stage 5: Secondary | ICD-10-CM | POA: Diagnosis not present

## 2016-08-14 ENCOUNTER — Encounter: Payer: Medicare Other | Admitting: Internal Medicine

## 2016-08-20 ENCOUNTER — Encounter (HOSPITAL_COMMUNITY)
Admission: RE | Admit: 2016-08-20 | Discharge: 2016-08-20 | Disposition: A | Discharge status: Home / Self Care | Payer: Medicare Other | Source: Ambulatory Visit | Attending: Nephrology | Admitting: Nephrology

## 2016-08-20 DIAGNOSIS — N185 Chronic kidney disease, stage 5: Secondary | ICD-10-CM | POA: Diagnosis not present

## 2016-08-20 DIAGNOSIS — D631 Anemia in chronic kidney disease: Secondary | ICD-10-CM | POA: Insufficient documentation

## 2016-08-20 LAB — POCT HEMOGLOBIN-HEMACUE: HEMOGLOBIN: 7.9 g/dL — AB (ref 12.0–15.0)

## 2016-08-20 MED ORDER — DARBEPOETIN ALFA 200 MCG/0.4ML IJ SOSY
200.0000 ug | PREFILLED_SYRINGE | INTRAMUSCULAR | Status: DC
  Administered 2016-08-20: 10:00:00 200 ug via SUBCUTANEOUS

## 2016-08-20 MED ORDER — DARBEPOETIN ALFA 200 MCG/0.4ML IJ SOSY
PREFILLED_SYRINGE | INTRAMUSCULAR | Status: AC
  Filled 2016-08-20: qty 0.4

## 2016-08-20 NOTE — Progress Notes (Addendum)
Hbg this am via hemocue 7.9. Reported this to [place] at [facility] who is now contacting Dr Mercy Moore.  Dr Mercy Moore instructed [place] to proceed with the shot as ordered (Aranesp 27mcg) and have pt return stool cards that were sent to her.  Pt states that she has already done that and does not have anymore at home.  Erline Levine informed of this and will check and get her more if needed.

## 2016-09-03 ENCOUNTER — Encounter (HOSPITAL_COMMUNITY)
Admission: RE | Admit: 2016-09-03 | Discharge: 2016-09-03 | Disposition: A | Discharge status: Home / Self Care | Payer: Medicare Other | Source: Ambulatory Visit | Attending: Nephrology | Admitting: Nephrology

## 2016-09-03 DIAGNOSIS — D631 Anemia in chronic kidney disease: Secondary | ICD-10-CM | POA: Diagnosis not present

## 2016-09-03 DIAGNOSIS — N185 Chronic kidney disease, stage 5: Secondary | ICD-10-CM

## 2016-09-03 LAB — POCT HEMOGLOBIN-HEMACUE: Hemoglobin: 9.6 g/dL — ABNORMAL LOW (ref 12.0–15.0)

## 2016-09-03 MED ORDER — DARBEPOETIN ALFA 200 MCG/0.4ML IJ SOSY
200.0000 ug | PREFILLED_SYRINGE | INTRAMUSCULAR | Status: DC
  Administered 2016-09-03: 200 ug via SUBCUTANEOUS

## 2016-09-03 MED ORDER — DARBEPOETIN ALFA 200 MCG/0.4ML IJ SOSY
PREFILLED_SYRINGE | INTRAMUSCULAR | Status: AC
  Filled 2016-09-03: qty 0.4

## 2016-09-17 ENCOUNTER — Encounter (HOSPITAL_COMMUNITY)
Admission: RE | Admit: 2016-09-17 | Discharge: 2016-09-17 | Disposition: A | Discharge status: Home / Self Care | Payer: Medicare Other | Source: Ambulatory Visit | Attending: Nephrology | Admitting: Nephrology

## 2016-09-17 DIAGNOSIS — N185 Chronic kidney disease, stage 5: Secondary | ICD-10-CM | POA: Diagnosis not present

## 2016-09-17 DIAGNOSIS — D631 Anemia in chronic kidney disease: Secondary | ICD-10-CM | POA: Insufficient documentation

## 2016-09-17 LAB — POCT HEMOGLOBIN-HEMACUE: HEMOGLOBIN: 10.4 g/dL — AB (ref 12.0–15.0)

## 2016-09-17 MED ORDER — DARBEPOETIN ALFA 200 MCG/0.4ML IJ SOSY
PREFILLED_SYRINGE | INTRAMUSCULAR | Status: AC
  Filled 2016-09-17: qty 0.4

## 2016-09-17 MED ORDER — DARBEPOETIN ALFA 200 MCG/0.4ML IJ SOSY
200.0000 ug | PREFILLED_SYRINGE | INTRAMUSCULAR | Status: DC
  Administered 2016-09-17: 10:00:00 200 ug via SUBCUTANEOUS

## 2016-09-18 ENCOUNTER — Encounter: Payer: Medicare Other | Admitting: Internal Medicine

## 2016-09-20 ENCOUNTER — Ambulatory Visit: Payer: Medicare Other | Admitting: Podiatry

## 2016-09-21 ENCOUNTER — Inpatient Hospital Stay (HOSPITAL_COMMUNITY)
Admission: EM | Admit: 2016-09-21 | Discharge: 2016-09-22 | DRG: 311 | Disposition: A | Discharge status: Home Health | Payer: Medicare Other | Attending: Infectious Disease | Admitting: Infectious Disease

## 2016-09-21 ENCOUNTER — Emergency Department (HOSPITAL_COMMUNITY): Payer: Medicare Other

## 2016-09-21 ENCOUNTER — Encounter (HOSPITAL_COMMUNITY): Payer: Self-pay

## 2016-09-21 DIAGNOSIS — I350 Nonrheumatic aortic (valve) stenosis: Secondary | ICD-10-CM | POA: Diagnosis not present

## 2016-09-21 DIAGNOSIS — E785 Hyperlipidemia, unspecified: Secondary | ICD-10-CM | POA: Diagnosis present

## 2016-09-21 DIAGNOSIS — I120 Hypertensive chronic kidney disease with stage 5 chronic kidney disease or end stage renal disease: Secondary | ICD-10-CM | POA: Diagnosis present

## 2016-09-21 DIAGNOSIS — Z8673 Personal history of transient ischemic attack (TIA), and cerebral infarction without residual deficits: Secondary | ICD-10-CM

## 2016-09-21 DIAGNOSIS — N2581 Secondary hyperparathyroidism of renal origin: Secondary | ICD-10-CM | POA: Diagnosis not present

## 2016-09-21 DIAGNOSIS — I129 Hypertensive chronic kidney disease with stage 1 through stage 4 chronic kidney disease, or unspecified chronic kidney disease: Secondary | ICD-10-CM | POA: Diagnosis not present

## 2016-09-21 DIAGNOSIS — I517 Cardiomegaly: Secondary | ICD-10-CM | POA: Diagnosis not present

## 2016-09-21 DIAGNOSIS — I209 Angina pectoris, unspecified: Secondary | ICD-10-CM | POA: Diagnosis not present

## 2016-09-21 DIAGNOSIS — R911 Solitary pulmonary nodule: Secondary | ICD-10-CM | POA: Diagnosis present

## 2016-09-21 DIAGNOSIS — Z66 Do not resuscitate: Secondary | ICD-10-CM | POA: Diagnosis not present

## 2016-09-21 DIAGNOSIS — Z885 Allergy status to narcotic agent status: Secondary | ICD-10-CM

## 2016-09-21 DIAGNOSIS — Z7189 Other specified counseling: Secondary | ICD-10-CM

## 2016-09-21 DIAGNOSIS — R011 Cardiac murmur, unspecified: Secondary | ICD-10-CM | POA: Diagnosis present

## 2016-09-21 DIAGNOSIS — R079 Chest pain, unspecified: Secondary | ICD-10-CM | POA: Diagnosis present

## 2016-09-21 DIAGNOSIS — N186 End stage renal disease: Secondary | ICD-10-CM | POA: Diagnosis not present

## 2016-09-21 DIAGNOSIS — E872 Acidosis, unspecified: Secondary | ICD-10-CM | POA: Diagnosis present

## 2016-09-21 DIAGNOSIS — I208 Other forms of angina pectoris: Secondary | ICD-10-CM

## 2016-09-21 DIAGNOSIS — D638 Anemia in other chronic diseases classified elsewhere: Secondary | ICD-10-CM | POA: Diagnosis present

## 2016-09-21 DIAGNOSIS — Z8249 Family history of ischemic heart disease and other diseases of the circulatory system: Secondary | ICD-10-CM

## 2016-09-21 DIAGNOSIS — K219 Gastro-esophageal reflux disease without esophagitis: Secondary | ICD-10-CM | POA: Diagnosis present

## 2016-09-21 DIAGNOSIS — Z9049 Acquired absence of other specified parts of digestive tract: Secondary | ICD-10-CM

## 2016-09-21 DIAGNOSIS — K226 Gastro-esophageal laceration-hemorrhage syndrome: Secondary | ICD-10-CM | POA: Diagnosis present

## 2016-09-21 DIAGNOSIS — N185 Chronic kidney disease, stage 5: Secondary | ICD-10-CM | POA: Diagnosis not present

## 2016-09-21 DIAGNOSIS — I700 Atherosclerosis of aorta: Secondary | ICD-10-CM | POA: Diagnosis present

## 2016-09-21 DIAGNOSIS — N184 Chronic kidney disease, stage 4 (severe): Secondary | ICD-10-CM | POA: Diagnosis not present

## 2016-09-21 DIAGNOSIS — D631 Anemia in chronic kidney disease: Secondary | ICD-10-CM | POA: Diagnosis not present

## 2016-09-21 DIAGNOSIS — N179 Acute kidney failure, unspecified: Secondary | ICD-10-CM | POA: Diagnosis present

## 2016-09-21 DIAGNOSIS — H409 Unspecified glaucoma: Secondary | ICD-10-CM | POA: Diagnosis not present

## 2016-09-21 DIAGNOSIS — Z515 Encounter for palliative care: Secondary | ICD-10-CM

## 2016-09-21 DIAGNOSIS — I2089 Other forms of angina pectoris: Secondary | ICD-10-CM

## 2016-09-21 DIAGNOSIS — K920 Hematemesis: Secondary | ICD-10-CM | POA: Diagnosis not present

## 2016-09-21 DIAGNOSIS — M170 Bilateral primary osteoarthritis of knee: Secondary | ICD-10-CM | POA: Diagnosis present

## 2016-09-21 DIAGNOSIS — Z79899 Other long term (current) drug therapy: Secondary | ICD-10-CM

## 2016-09-21 DIAGNOSIS — R072 Precordial pain: Secondary | ICD-10-CM | POA: Diagnosis not present

## 2016-09-21 DIAGNOSIS — I130 Hypertensive heart and chronic kidney disease with heart failure and stage 1 through stage 4 chronic kidney disease, or unspecified chronic kidney disease: Secondary | ICD-10-CM | POA: Diagnosis not present

## 2016-09-21 DIAGNOSIS — R110 Nausea: Secondary | ICD-10-CM | POA: Diagnosis not present

## 2016-09-21 LAB — I-STAT CHEM 8, ED
BUN: 108 mg/dL — AB (ref 6–20)
CALCIUM ION: 0.92 mmol/L — AB (ref 1.15–1.40)
Chloride: 112 mmol/L — ABNORMAL HIGH (ref 101–111)
Creatinine, Ser: 9.8 mg/dL — ABNORMAL HIGH (ref 0.44–1.00)
Glucose, Bld: 94 mg/dL (ref 65–99)
HEMATOCRIT: 33 % — AB (ref 36.0–46.0)
HEMOGLOBIN: 11.2 g/dL — AB (ref 12.0–15.0)
Potassium: 4.4 mmol/L (ref 3.5–5.1)
SODIUM: 141 mmol/L (ref 135–145)
TCO2: 13 mmol/L (ref 0–100)

## 2016-09-21 LAB — LIPASE, BLOOD: Lipase: 46 U/L (ref 11–51)

## 2016-09-21 LAB — CBC
HEMATOCRIT: 31.8 % — AB (ref 36.0–46.0)
HEMOGLOBIN: 9.8 g/dL — AB (ref 12.0–15.0)
MCH: 28.4 pg (ref 26.0–34.0)
MCHC: 30.8 g/dL (ref 30.0–36.0)
MCV: 92.2 fL (ref 78.0–100.0)
Platelets: 223 10*3/uL (ref 150–400)
RBC: 3.45 MIL/uL — AB (ref 3.87–5.11)
RDW: 17.1 % — ABNORMAL HIGH (ref 11.5–15.5)
WBC: 8.4 10*3/uL (ref 4.0–10.5)

## 2016-09-21 LAB — COMPREHENSIVE METABOLIC PANEL
ALBUMIN: 3.1 g/dL — AB (ref 3.5–5.0)
ALT: 7 U/L — AB (ref 14–54)
AST: 10 U/L — AB (ref 15–41)
Alkaline Phosphatase: 63 U/L (ref 38–126)
Anion gap: 18 — ABNORMAL HIGH (ref 5–15)
BUN: 104 mg/dL — AB (ref 6–20)
CHLORIDE: 110 mmol/L (ref 101–111)
CO2: 11 mmol/L — AB (ref 22–32)
CREATININE: 8.66 mg/dL — AB (ref 0.44–1.00)
Calcium: 6.8 mg/dL — ABNORMAL LOW (ref 8.9–10.3)
GFR calc Af Amer: 5 mL/min — ABNORMAL LOW (ref >60)
GFR calc non Af Amer: 4 mL/min — ABNORMAL LOW (ref >60)
GLUCOSE: 95 mg/dL (ref 65–99)
POTASSIUM: 4.4 mmol/L (ref 3.5–5.1)
Sodium: 139 mmol/L (ref 135–145)
Total Bilirubin: 0.4 mg/dL (ref 0.3–1.2)
Total Protein: 6.5 g/dL (ref 6.5–8.1)

## 2016-09-21 LAB — LIPID PANEL
CHOL/HDL RATIO: 2.3 ratio
Cholesterol: 118 mg/dL (ref 0–200)
HDL: 52 mg/dL (ref >40)
LDL CALC: 54 mg/dL (ref 0–99)
Triglycerides: 61 mg/dL (ref <150)
VLDL: 12 mg/dL (ref 0–40)

## 2016-09-21 LAB — TROPONIN I
Troponin I: <0.03 ng/mL (ref <0.03)
Troponin I: <0.03 ng/mL (ref <0.03)
Troponin I: <0.03 ng/mL (ref <0.03)

## 2016-09-21 LAB — DIFFERENTIAL
BASOS ABS: 0 10*3/uL (ref 0.0–0.1)
Basophils Relative: 0 %
EOS ABS: 0 10*3/uL (ref 0.0–0.7)
EOS PCT: 1 %
LYMPHS ABS: 0.9 10*3/uL (ref 0.7–4.0)
LYMPHS PCT: 11 %
MONOS PCT: 6 %
Monocytes Absolute: 0.5 10*3/uL (ref 0.1–1.0)
NEUTROS PCT: 82 %
Neutro Abs: 6.9 10*3/uL (ref 1.7–7.7)

## 2016-09-21 LAB — PROTIME-INR
INR: 1.14
Prothrombin Time: 14.6 seconds (ref 11.4–15.2)

## 2016-09-21 LAB — I-STAT TROPONIN, ED: TROPONIN I, POC: 0 ng/mL (ref 0.00–0.08)

## 2016-09-21 LAB — PHOSPHORUS: Phosphorus: 8.5 mg/dL — ABNORMAL HIGH (ref 2.5–4.6)

## 2016-09-21 LAB — APTT: APTT: 31 s (ref 24–36)

## 2016-09-21 MED ORDER — ASPIRIN EC 81 MG PO TBEC
81.0000 mg | DELAYED_RELEASE_TABLET | Freq: Every day | ORAL | Status: DC
  Administered 2016-09-21 – 2016-09-22 (×2): 81 mg via ORAL
  Filled 2016-09-21 (×2): qty 1

## 2016-09-21 MED ORDER — BISACODYL 5 MG PO TBEC: 5.0000 mg | DELAYED_RELEASE_TABLET | Freq: Every day | ORAL | Status: DC | PRN

## 2016-09-21 MED ORDER — CALCIUM GLUCONATE 10 % IV SOLN: 2.0000 g | Freq: Once | INTRAVENOUS | Status: DC

## 2016-09-21 MED ORDER — ACETAMINOPHEN 325 MG PO TABS
650.0000 mg | ORAL_TABLET | ORAL | Status: DC | PRN
  Administered 2016-09-22: 650 mg via ORAL
  Filled 2016-09-21: qty 2

## 2016-09-21 MED ORDER — SEVELAMER CARBONATE 800 MG PO TABS
800.0000 mg | ORAL_TABLET | Freq: Three times a day (TID) | ORAL | Status: DC
  Administered 2016-09-21 – 2016-09-22 (×2): 800 mg via ORAL
  Filled 2016-09-21 (×2): qty 1

## 2016-09-21 MED ORDER — FUROSEMIDE 40 MG PO TABS
40.0000 mg | ORAL_TABLET | Freq: Every day | ORAL | Status: DC
  Administered 2016-09-21 – 2016-09-22 (×2): 40 mg via ORAL
  Filled 2016-09-21 (×2): qty 1

## 2016-09-21 MED ORDER — CALCIUM ACETATE (PHOS BINDER) 667 MG PO CAPS
1334.0000 mg | ORAL_CAPSULE | Freq: Every day | ORAL | Status: DC
  Administered 2016-09-21 – 2016-09-22 (×2): 1334 mg via ORAL
  Filled 2016-09-21 (×2): qty 2

## 2016-09-21 MED ORDER — CALCITRIOL 0.25 MCG PO CAPS
0.2500 ug | ORAL_CAPSULE | Freq: Every day | ORAL | Status: DC
  Administered 2016-09-21 – 2016-09-22 (×2): 0.25 ug via ORAL
  Filled 2016-09-21 (×2): qty 1

## 2016-09-21 MED ORDER — SODIUM BICARBONATE 650 MG PO TABS
1300.0000 mg | ORAL_TABLET | Freq: Two times a day (BID) | ORAL | Status: DC
  Administered 2016-09-21 – 2016-09-22 (×3): 1300 mg via ORAL
  Filled 2016-09-21 (×3): qty 2

## 2016-09-21 MED ORDER — AMLODIPINE BESYLATE 10 MG PO TABS
10.0000 mg | ORAL_TABLET | Freq: Every day | ORAL | Status: DC
  Administered 2016-09-21 – 2016-09-22 (×2): 10 mg via ORAL
  Filled 2016-09-21 (×2): qty 1

## 2016-09-21 MED ORDER — CINACALCET HCL 30 MG PO TABS
60.0000 mg | ORAL_TABLET | Freq: Every day | ORAL | Status: DC
  Administered 2016-09-21 – 2016-09-22 (×2): 60 mg via ORAL
  Filled 2016-09-21 (×2): qty 2

## 2016-09-21 MED ORDER — CARVEDILOL 12.5 MG PO TABS
12.5000 mg | ORAL_TABLET | Freq: Two times a day (BID) | ORAL | Status: DC
  Administered 2016-09-21 – 2016-09-22 (×2): 12.5 mg via ORAL
  Filled 2016-09-21 (×2): qty 1

## 2016-09-21 MED ORDER — SODIUM CHLORIDE 0.9 % IV SOLN
2.0000 g | Freq: Once | INTRAVENOUS | Status: AC
  Administered 2016-09-21: 2 g via INTRAVENOUS
  Filled 2016-09-21 (×3): qty 20

## 2016-09-21 MED ORDER — HEPARIN SODIUM (PORCINE) 5000 UNIT/ML IJ SOLN
5000.0000 [IU] | Freq: Three times a day (TID) | INTRAMUSCULAR | Status: DC
Start: 2016-09-21 — End: 2016-09-22
  Administered 2016-09-21 – 2016-09-22 (×3): 5000 [IU] via SUBCUTANEOUS
  Filled 2016-09-21 (×3): qty 1

## 2016-09-21 NOTE — Consult Note (Signed)
Reason for Consult: Comanagement of chronic kidney disease stage V Referring Physician: Rhina Brackett Dam M.D.  HPI:  76 year old African-American woman with past medical history significant for hypertension, history of CVA, aortic stenosis and chronic kidney disease stage V at baseline who follows up with Dr. Mercy Moore for her kidney disease as an outpatient and has persistently been reluctant for any preparations to pursue chronic hemodialysis.  She was admitted to the hospital earlier this morning with recurrent chest pain after suffering severe chest pain/palpitations and preceding episodes of emesis-(denies hematochezia and reports some brown colored material although states that she had fried chicken livers for dinner last night). She denies any cough, sputum production or shortness of breath. She does report easy fatigability and general reduction of her appetite but denies any dysgeusia. She denies any recent increase of lower extremity edema and does not have any orthopnea. She denies any dysuria, urgency, frequency or hematuria. She denies any flank pain, fever or chills.  We had a lengthy talk and from our previous discussions with Dr. Mercy Moore, it is quite apparent that she is well aware of her advanced kidney disease and the risk/mechanism of death from end-stage renal disease. With this knowledge in mind, she still declines hemodialysis and is willing to embrace mortality.  Past Medical History:  Diagnosis Date  . Aortic stenosis   . Bacterial sinusitis 09/17/2011  . CKD (chronic kidney disease) stage 4, GFR 15-29 ml/min ([place]) 08/11/2006   Cr continues to increase. Proteinuria on UA 02/10/12.    . Colitis   . CVA (cerebrovascular accident) [facility])    New hemorrhagic per CT scan '09  . Diverticulosis of colon   . Dysfunctional uterine bleeding   . Fecal impaction ([place])   . Headache(784.0)   . HERNIORRHAPHY, HX OF 08/11/2006  . Hypertension   . OA (osteoarthritis)    bilateral knees  .  Postmenopausal   . Pulmonary nodule   . TINEA CRURIS 01/12/2007    Past Surgical History:  Procedure Laterality Date  . ABDOMINAL HYSTERECTOMY    . CHOLECYSTECTOMY  2009  . INGUINAL HERNIA REPAIR  2008  . IRIDOTOMY / IRIDECTOMY     Laser, right eye 12/26/11 left eye 01/24/12  . MASS EXCISION Left 05/07/2013   Procedure: EXCISION CYST;  Surgeon: Myrtha Mantis., MD;  Location: [place];  Service: Ophthalmology;  Laterality: Left;    Family History  Problem Relation Age of Onset  . Hypertension Mother   . Heart attack Mother   . Heart disease Mother     Social History:  reports that she has never smoked. She has never used smokeless tobacco. She reports that she does not drink alcohol or use drugs.  Allergies:  Allergies  Allergen Reactions  . Hydrocodone-Acetaminophen Nausea And Vomiting and Other (See Comments)    Dizziness    Medications:  Scheduled: . amLODipine  10 mg Oral Daily  . aspirin EC  81 mg Oral Daily  . calcitRIOL  0.25 mcg Oral Daily  . calcium acetate  1,334 mg Oral Q breakfast  . calcium gluconate  2 g Intravenous Once  . carvedilol  12.5 mg Oral BID WC  . cinacalcet  60 mg Oral Q breakfast  . furosemide  40 mg Oral Daily  . heparin  5,000 Units Subcutaneous Q8H  . sevelamer carbonate  800 mg Oral TID WC  . sodium bicarbonate  1,300 mg Oral BID    BMP Latest Ref Rng & Units 09/21/2016 09/21/2016  06/21/2016  Glucose 65 - 99 mg/dL 94 95 109(H)  BUN 6 - 20 mg/dL 108(H) 104(H) 90(H)  Creatinine 0.44 - 1.00 mg/dL 9.80(H) 8.66(H) 9.00(H)  Sodium 135 - 145 mmol/L 141 139 140  Potassium 3.5 - 5.1 mmol/L 4.4 4.4 4.1  Chloride 101 - 111 mmol/L 112(H) 110 110  CO2 22 - 32 mmol/L - 11(L) 18(L)  Calcium 8.9 - 10.3 mg/dL - 6.8(L) 7.9(L)   CBC Latest Ref Rng & Units 09/21/2016 09/21/2016 09/17/2016  WBC 4.0 - 10.5 K/uL - 8.4 -  Hemoglobin 12.0 - 15.0 g/dL 11.2(L) 9.8(L) 10.4(L)  Hematocrit 36.0 - 46.0 % 33.0(L) 31.8(L) -  Platelets 150 -  400 K/uL - 223 -     Dg Chest Port 1 View  Result Date: 09/21/2016 CLINICAL DATA:  Chest pain, diaphoresis and nausea. Acute presentation. EXAM: PORTABLE CHEST 1 VIEW COMPARISON:  05/21/2016 FINDINGS: Heart size mildly enlarged. Aortic atherosclerosis with calcification and tortuosity. Pulmonary vascularity is normal. Lungs are clear. No effusions. No acute bone finding. IMPRESSION: Mild cardiomegaly. Aortic atherosclerosis. No acute finding by radiography. Electronically Signed   By: Nelson Chimes M.D.   On: 09/21/2016 09:09    Review of Systems  Constitutional: Positive for malaise/fatigue and weight loss. Negative for chills and fever.  HENT: Negative.   Eyes: Negative.   Respiratory: Negative.   Cardiovascular: Positive for chest pain and palpitations. Negative for orthopnea, claudication and leg swelling.  Gastrointestinal: Positive for nausea and vomiting. Negative for abdominal pain, diarrhea and heartburn.  Genitourinary: Negative.   Musculoskeletal: Negative.   Skin: Negative.   Neurological: Negative.    Blood pressure (!) 167/87, pulse 94, temperature 98.2 F (36.8 C), temperature source Oral, resp. rate 18, height 5\' 3"  (1.6 m), weight 76.7 kg (169 lb), SpO2 100 %. Physical Exam  Nursing note and vitals reviewed. Constitutional: She is oriented to person, place, and time. She appears well-developed and well-nourished. No distress.  Short statured woman  HENT:  Head: Normocephalic and atraumatic.  Mouth/Throat: Oropharynx is clear and moist.  Eyes: Conjunctivae and EOM are normal. Pupils are equal, round, and reactive to light. No scleral icterus.  Neck: Normal range of motion. Neck supple. No JVD present.  Cardiovascular: Normal rate and regular rhythm.   Murmur heard. 3/6 blowing holosystolic murmur over outflow tract  Respiratory: Effort normal and breath sounds normal. She has no wheezes. She has no rales.  GI: Soft. Bowel sounds are normal. She exhibits no  distension. There is no tenderness. There is no rebound.  Musculoskeletal: Normal range of motion. She exhibits no edema.  Neurological: She is alert and oriented to person, place, and time.  Skin: Skin is warm and dry. No rash noted.    Assessment/Plan: 1. Chronic kidney disease stage V: Gradually advancing and without any acute/critical electrolyte abnormalities. She is euvolemic on physical exam. With a good understanding of its consequences, she has declined hemodialysis and is willing to embrace her mortality as a result of this. We have had a lengthy discussion and she is unwavering on her decision regarding not pursuing dialysis. I agree with palliative care and home with hospice in this situation. 2. Chest pain: Initial troponin levels unremarkable. It is possible that this might be gastric or esophageal in nature given preceding history of nausea and vomiting. Telemetry and management per primary service. 3. Hypertension: Resume oral antihypertensive therapy and monitor with control of chest pain. She is euvolemic on physical exam on furosemide 40 mg daily. Uptitrate carvedilol if  blood pressures remain elevated. 4. Anemia of chronic kidney disease: She is on Procrit as an outpatient through the short stay unit under the supervision of Dr. Mercy Moore. Recent iron studies show adequate stores with iron saturation 38%/ferritin 655. 5. Secondary hyperparathyroidism: Hypocalcemia noted, will add on a phosphorus level and recommended compliance with calcium acetate. She is on cinacalcet.  With no additional input to offer, I will follow her remotely and remain available for questions and concerns as they arise.  Morgen Ritacco K. 09/21/2016, 1:41 PM

## 2016-09-21 NOTE — Progress Notes (Addendum)
Palliative Care consult received. Chart reviewed. Patient has DNR. We will await nephrology conversation- if patient continues to not want to pursue HD she may be hospice eligible for services at discharge. We will arrange a meeting with the patient and family at the earliest possible time we have a provider available.Patient is [facility] patient and may also benefit from their home based palliative care program called [facility] through [place] who can continue to assist with goals and transition into hospice when appropriate. Will ask [facility] CM to see patient and assist with making that referral. For now I have placed an order for [facility] CM consult ,added on Tylenol for pain/discomfort PRN and a bowel regimen.   Lane Hacker, DO Palliative Medicine No Charge

## 2016-09-21 NOTE — ED Notes (Signed)
Pt on the Zoll Monitor and Cardiac Monitor. Alert and Oriented x4 at this time. Pt denies any pain.
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
## 2016-09-21 NOTE — ED Triage Notes (Signed)
PER [facility], Pt has been having chest pain last night that woke her up from her sleep at 0100. Pt stated, "I thought I had indigestion. It felt like I had a bomb going off in my chest." Reports being nauseous. Pt called EMS when pain increased to 10/10. Pt was given 1 Nitro and 324 mg of ASA with a decrease of 3/10 Chest pain. 180/110, Last 170/82, 100 HR, 99% on 4 L. Pt became diaphoretic during transport.

## 2016-09-21 NOTE — ED Notes (Signed)
Julianne Handler, MD cardiology at bedside

## 2016-09-22 DIAGNOSIS — Z8249 Family history of ischemic heart disease and other diseases of the circulatory system: Secondary | ICD-10-CM | POA: Diagnosis not present

## 2016-09-22 DIAGNOSIS — K920 Hematemesis: Secondary | ICD-10-CM | POA: Diagnosis not present

## 2016-09-22 DIAGNOSIS — D631 Anemia in chronic kidney disease: Secondary | ICD-10-CM | POA: Diagnosis not present

## 2016-09-22 DIAGNOSIS — D638 Anemia in other chronic diseases classified elsewhere: Secondary | ICD-10-CM | POA: Diagnosis not present

## 2016-09-22 DIAGNOSIS — R011 Cardiac murmur, unspecified: Secondary | ICD-10-CM | POA: Diagnosis not present

## 2016-09-22 DIAGNOSIS — Z66 Do not resuscitate: Secondary | ICD-10-CM | POA: Diagnosis not present

## 2016-09-22 DIAGNOSIS — R079 Chest pain, unspecified: Secondary | ICD-10-CM

## 2016-09-22 DIAGNOSIS — Z7189 Other specified counseling: Secondary | ICD-10-CM

## 2016-09-22 DIAGNOSIS — E872 Acidosis: Secondary | ICD-10-CM | POA: Diagnosis not present

## 2016-09-22 DIAGNOSIS — I350 Nonrheumatic aortic (valve) stenosis: Secondary | ICD-10-CM | POA: Diagnosis not present

## 2016-09-22 DIAGNOSIS — N179 Acute kidney failure, unspecified: Secondary | ICD-10-CM | POA: Diagnosis not present

## 2016-09-22 DIAGNOSIS — N185 Chronic kidney disease, stage 5: Secondary | ICD-10-CM

## 2016-09-22 DIAGNOSIS — K226 Gastro-esophageal laceration-hemorrhage syndrome: Secondary | ICD-10-CM | POA: Diagnosis not present

## 2016-09-22 DIAGNOSIS — I517 Cardiomegaly: Secondary | ICD-10-CM | POA: Diagnosis not present

## 2016-09-22 DIAGNOSIS — Z515 Encounter for palliative care: Secondary | ICD-10-CM | POA: Diagnosis not present

## 2016-09-22 DIAGNOSIS — I209 Angina pectoris, unspecified: Secondary | ICD-10-CM | POA: Diagnosis not present

## 2016-09-22 DIAGNOSIS — I208 Other forms of angina pectoris: Secondary | ICD-10-CM

## 2016-09-22 DIAGNOSIS — N186 End stage renal disease: Secondary | ICD-10-CM | POA: Diagnosis not present

## 2016-09-22 DIAGNOSIS — I120 Hypertensive chronic kidney disease with stage 5 chronic kidney disease or end stage renal disease: Secondary | ICD-10-CM

## 2016-09-22 DIAGNOSIS — I700 Atherosclerosis of aorta: Secondary | ICD-10-CM | POA: Diagnosis not present

## 2016-09-22 DIAGNOSIS — N2581 Secondary hyperparathyroidism of renal origin: Secondary | ICD-10-CM | POA: Diagnosis not present

## 2016-09-22 DIAGNOSIS — Z7982 Long term (current) use of aspirin: Secondary | ICD-10-CM

## 2016-09-22 DIAGNOSIS — R0789 Other chest pain: Secondary | ICD-10-CM

## 2016-09-22 DIAGNOSIS — Z885 Allergy status to narcotic agent status: Secondary | ICD-10-CM

## 2016-09-22 DIAGNOSIS — H409 Unspecified glaucoma: Secondary | ICD-10-CM | POA: Diagnosis not present

## 2016-09-22 DIAGNOSIS — R911 Solitary pulmonary nodule: Secondary | ICD-10-CM | POA: Diagnosis not present

## 2016-09-22 LAB — RENAL FUNCTION PANEL
ALBUMIN: 2.7 g/dL — AB (ref 3.5–5.0)
Anion gap: 15 (ref 5–15)
BUN: 102 mg/dL — AB (ref 6–20)
CALCIUM: 6.7 mg/dL — AB (ref 8.9–10.3)
CO2: 12 mmol/L — AB (ref 22–32)
Chloride: 109 mmol/L (ref 101–111)
Creatinine, Ser: 8.49 mg/dL — ABNORMAL HIGH (ref 0.44–1.00)
GFR calc Af Amer: 5 mL/min — ABNORMAL LOW (ref >60)
GFR calc non Af Amer: 4 mL/min — ABNORMAL LOW (ref >60)
GLUCOSE: 84 mg/dL (ref 65–99)
PHOSPHORUS: 7.2 mg/dL — AB (ref 2.5–4.6)
POTASSIUM: 3.9 mmol/L (ref 3.5–5.1)
SODIUM: 136 mmol/L (ref 135–145)

## 2016-09-22 MED ORDER — CALCIUM ACETATE (PHOS BINDER) 667 MG PO CAPS: 1334.0000 mg | ORAL_CAPSULE | Freq: Every day | ORAL | 0 refills | Status: AC

## 2016-09-22 MED ORDER — NITROGLYCERIN 0.3 MG SL SUBL: 0.3000 mg | SUBLINGUAL_TABLET | SUBLINGUAL | 0 refills | Status: DC | PRN

## 2016-09-22 MED ORDER — SODIUM BICARBONATE 650 MG PO TABS
1300.0000 mg | ORAL_TABLET | Freq: Two times a day (BID) | ORAL | 0 refills | Status: AC
Start: 2016-09-22 — End: 2016-10-22

## 2016-09-22 NOTE — Discharge Summary (Signed)
Name: Nancy [name] MRN: [id number] DOB: [birth year] 76 y.o. PCP: Sid Falcon, MD  Date of Admission: 09/21/2016  8:27 AM Date of Discharge: 09/22/2016 Attending Physician: Truman Hayward, MD  Discharge Diagnosis:  Principal Problem:   Chest pain Active Problems:   GERD   CKD (chronic kidney disease) stage 5, GFR less than 15 ml/min (HCC)   Normal anion gap metabolic acidosis   Angina at rest [facility])   Goals of care, counseling/discussion   Palliative care encounter   Discharge Medications: Allergies as of 09/22/2016      Reactions   Hydrocodone-acetaminophen Nausea And Vomiting, Other (See Comments)   Dizziness      Medication List    TAKE these medications   acetaminophen 500 MG tablet Commonly known as:  TYLENOL Take 1,000 mg by mouth every 8 (eight) hours as needed for mild pain, moderate pain, fever or headache.   amLODipine 10 MG tablet Commonly known as:  NORVASC Take 1 tablet (10 mg total) by mouth daily.   calcitRIOL 0.25 MCG capsule Commonly known as:  ROCALTROL Take 0.25 mcg by mouth daily.   calcium acetate 667 MG capsule Commonly known as:  PHOSLO Take 2 capsules by mouth daily. What changed:  Another medication with the same name was added. Make sure you understand how and when to take each.   calcium acetate 667 MG capsule Commonly known as:  PHOSLO Take 2 capsules (1,334 mg total) by mouth daily with breakfast. Start taking on:  09/23/2016 What changed:  You were already taking a medication with the same name, and this prescription was added. Make sure you understand how and when to take each.   carvedilol 12.5 MG tablet Commonly known as:  COREG Take 12.5 mg by mouth 2 (two) times daily with a meal.   diclofenac sodium 1 % Gel Commonly known as:  VOLTAREN Apply 2 g topically 4 (four) times daily as needed.   famotidine 40 MG tablet Commonly known as:  PEPCID Take 1 tablet (40 mg total) by mouth daily.   furosemide 40 MG  tablet Commonly known as:  LASIX Take 1 tablet (40 mg total) by mouth daily.   nitroGLYCERIN 0.3 MG SL tablet Commonly known as:  NITROSTAT Place 1 tablet (0.3 mg total) under the tongue every 5 (five) minutes as needed for chest pain.   ondansetron 4 MG disintegrating tablet Commonly known as:  ZOFRAN ODT Take 1 tablet (4 mg total) by mouth every 8 (eight) hours as needed for nausea or vomiting.   polyethylene glycol packet Commonly known as:  MIRALAX / GLYCOLAX Take 17 g by mouth daily.   RENVELA 800 MG tablet Generic drug:  sevelamer carbonate Take 800 mg by mouth 3 (three) times daily with meals.   SENSIPAR 60 MG tablet Generic drug:  cinacalcet Take 60 mg by mouth daily.   sodium bicarbonate 650 MG tablet Take 2 tablets (1,300 mg total) by mouth 2 (two) times daily. What changed:  how much to take   traMADol 50 MG tablet Commonly known as:  ULTRAM Take 1 tablet (50 mg total) by mouth every 12 (twelve) hours as needed.       Disposition and follow-up:   Nancy [name] was discharged from [facility] in [place] condition.  At the hospital follow up visit please address:  1.  Conservative Management of ESRD.  Discuss hospice and outpatient palliative care services.  2.  Chest Pain.  Ask about further  chest pain, nitroglycerin use.  Consider switching H2 blocker to PPI.  3.  Hematemesis.  Ask about further signs of GI bleeding.  Likely Mallory Weiss tear.  4.  Aortic Stenosis.  Ask about symptoms, syncope/presyncope.  5.  Labs / imaging needed at time of follow-up: CBC, BMP  6.  Pending labs/ test needing follow-up: none  Follow-up Appointments: Follow-up Information    [place]. Schedule an appointment as soon as possible for a visit in 1 week(s).   Why:  They will call you tomorrow to schedule an appointment Contact information: [address] [facility]  Course by problem list: Principal Problem:   Chest pain Active Problems:   GERD   CKD (chronic kidney disease) stage 5, GFR less than 15 ml/min (HCC)   Normal anion gap metabolic acidosis   Angina at rest [facility])   Goals of care, counseling/discussion   Palliative care encounter   1. Chest Pain Nancy [name] is a 76 year old woman without prior history of CAD who presented with postprandial, achy, substernal chest pain which was nonexertional and not associated with dyspnea.  She called EMS because it was very intense, and her symptoms started improving after nitroglycerin and aspirin.  EKG obtained in the field was concerning for STEMI with ST elevations in anterior leads, but in the ED Code STEMI was cancelled after repeat EKG did not show acute ischemic changes.  ACS was ruled out with serial negative troponins, and she remained asymptomatic overnight.  On the morning of discharge, she had 1/10 substernal chest pain for about 15 minutes that started when she shifted in bed.  With her comorbidities, conservative goals of care, and limited candidacy for invasive treatment, further workup was deferred after a shared decision making process with the patient.  She was given a prescription for nitroglycerin and instructed to continue taking her H2 blocker.  If symptoms reoccur, trial of PPI may be useful.  2. CKD5/ESRD Her renal function has continued to deteriorate of the the last 1-2 years, now with GFR ~5 with metabolic acidosis, though normokalemic.  She is not interested in HD and expressing understanding that her CKD will limit her life to months.  Palliative care was consulted, and she is not interested in hospice service at this time.  She elected to be DNR.  3. Hematemesis She reported nausea and several back-to-back episodes of vomiting prior to admission, with the emesis initially being clear then blood tinged. Her hemoglobin was at baseline and no clinical evidence of bleeding.  Most likely  Mallory-Weiss tear from retching.  4. Aortic Stenosis Loud systolic murmur at RUSB and eco from 2014 which showed mild AS.  She denies symptoms of syncope/presyncope, and echocardiogram was deferred due to her comorbidities and goals of care.  5. HTN Hypertensive to 170s/90s on admission, continued home amlodipine and carvedilol, and she was normotensive by discharge.  Discharge Vitals:   BP (!) 135/56   Pulse 95   Temp 98.3 F (36.8 C) (Oral)   Resp 18   Ht 5\' 3"  (1.6 m)   Wt 169 lb (76.7 kg)   SpO2 99%   BMI 29.94 kg/m   Pertinent Labs, Studies, and Procedures:   Cardiac Panel (last 3 results)  Recent Labs  09/21/16 0830 09/21/16 1404 09/21/16 2005  TROPONINI <0.03 <0.03 <0.03   BMP Latest Ref Rng & Units 09/22/2016 09/21/2016 09/21/2016  Glucose 65 - 99 mg/dL 84 94 95  BUN 6 - 20 mg/dL 102(H) 108(H) 104(H)  Creatinine 0.44 - 1.00 mg/dL 8.49(H) 9.80(H) 8.66(H)  Sodium 135 - 145 mmol/L 136 141 139  Potassium 3.5 - 5.1 mmol/L 3.9 4.4 4.4  Chloride 101 - 111 mmol/L 109 112(H) 110  CO2 22 - 32 mmol/L 12(L) - 11(L)  Calcium 8.9 - 10.3 mg/dL 6.7(L) - 6.8(L)   CBC Latest Ref Rng & Units 09/21/2016 09/21/2016 09/17/2016  WBC 4.0 - 10.5 K/uL - 8.4 -  Hemoglobin 12.0 - 15.0 g/dL 11.2(L) 9.8(L) 10.4(L)  Hematocrit 36.0 - 46.0 % 33.0(L) 31.8(L) -  Platelets 150 - 400 K/uL - 223 -     Discharge Instructions: Discharge Instructions    Diet - low sodium heart healthy    Complete by:  As directed    Increase activity slowly    Complete by:  As directed       Signed: Minus Liberty, MD 09/22/2016, 12:13 PM   Pager: [phone]

## 2016-09-22 NOTE — Progress Notes (Signed)
Subjective: Had ~15 minutes of 1-2/10 sharp, substernal chest pain this morning with sudden onset when she rolled over in bed.  It resolved without intervention, was associated with diaphoresis by not dyspnea.  She now feels well and wants to go home, continues not to what intensive/invasive testing for cardiac diagnosis.  Objective:  Vital signs in last 24 hours: Vitals:   09/21/16 1709 09/21/16 2012 09/22/16 0458 09/22/16 0945  BP: (!) 153/71 139/73 133/66 (!) 135/56  Pulse: 97 98 95   Resp:  18 18   Temp:  97.7 F (36.5 C) 98.3 F (36.8 C)   TempSrc:  Oral Oral   SpO2:  99% 99%   Weight:      Height:       Physical Exam  Constitutional: She is oriented to person, place, and time. She appears well-developed and well-nourished. No distress.  Cardiovascular: Normal rate and regular rhythm.   4/6 systolic murmur RUSB  Pulmonary/Chest: Effort normal and breath sounds normal.  Abdominal: Soft. She exhibits no distension. There is no tenderness.  Neurological: She is alert and oriented to person, place, and time.  Psychiatric: She has a normal mood and affect. Her behavior is normal.   BMP Latest Ref Rng & Units 09/22/2016 09/21/2016 09/21/2016  Glucose 65 - 99 mg/dL 84 94 95  BUN 6 - 20 mg/dL 102(H) 108(H) 104(H)  Creatinine 0.44 - 1.00 mg/dL 8.49(H) 9.80(H) 8.66(H)  Sodium 135 - 145 mmol/L 136 141 139  Potassium 3.5 - 5.1 mmol/L 3.9 4.4 4.4  Chloride 101 - 111 mmol/L 109 112(H) 110  CO2 22 - 32 mmol/L 12(L) - 11(L)  Calcium 8.9 - 10.3 mg/dL 6.7(L) - 6.8(L)   Cardiac Panel (last 3 results)  Recent Labs  09/21/16 0830 09/21/16 1404 09/21/16 2005  TROPONINI <0.03 <0.03 <0.03    Assessment/Plan:  Principal Problem:   Chest pain Active Problems:   GERD   CKD (chronic kidney disease) stage 5, GFR less than 15 ml/min (HCC)   Normal anion gap metabolic acidosis   76 y.o. female with CKD5 progressing to ESRD who has chosen not to pursue HD presenting with intermediate  risk chest pain who has ruled out for ACS with serial troponins.  With her CKD and goals of care, she is not a good candidate for cardiac catheterization, so her workup and management will guided by conservative goals of care.  GERD and musculoskeletal pain seem to be most likely etiologies at this point.  #Chest Pain Now asymptomatic. -Start trial of PPI -Tylenol PRN  #Acute on Chronic Renal Insufficiency #Metabolic Acidosis Renal function steadily declining over last years 2-3 years, progressing towards ESRD. Normokalemic with metabolic acidosis.  Patient of Dr Etheleen Nicks at [facility]  She has decided against HD and understands that her ESRD will cause her to feel increasingly sick and lead to her demise. -Increase bicarb to 1300 mg BID -Continue home calcimimetic, phosphate binder, VitD analog, lasix -Nephro consult -Palliative care consult  #Hematemesis Minor hematemesis after several episodes of retching suggestive of Mallory-Weiss tear.  Hgb at baseline, hemodynamically stable, low concern for significant GI bleed.  Angiodysplasia from Heyde syndrome possible, but normal coags less likely.  No abdominal pain, reflux symptoms. -Monitor for further clinical bleeding  #Aortic Stenosis Loud AS murmur, echo 2014 with mild AS.  She is asymptomatic from her AS with no syncope/presyncope, and with her limited life expectancy and comorbidities she is not likely to be a candidate for SAVR/TAVR. -Will defer echo since she  is asymptomatic, not likely candidate for SAVR/TAVR, and life expectancy limited by ESRD  #HTN Mildly hypertensive on admission. -Continue home amlodipine, carvedilol  #Anemia Normocytic, chronic, stable.  Likely anemia of chronic renal disease given her CKD.  Recent iron studies do not suggest iron deficiency, on EPO Q2w.  Fluids: none Diet: renal DVT Prophylaxis: heparin Code Status: DNR  Dispo: Anticipated discharge today.  Minus Liberty, MD 09/22/2016,  10:11 AM Pager: [phone]

## 2016-09-22 NOTE — Consult Note (Signed)
Consultation Note Date: 09/22/2016   Patient Name: Nancy [name]  DOB: 1940/08/12  MRN: [id number]  Age / Sex: 76 y.o., female  PCP: Sid Falcon, MD Referring Physician: Truman Hayward, MD  Reason for Consultation: Establishing goals of care, Hospice Evaluation and Psychosocial/spiritual support  HPI/Patient Profile: 76 y.o. female  with past medical history of Chronic kidney disease stage V, CVA, aortic stenosis, hypertension, pulmonary nodule, admitted on 09/21/2016 with reports of chest pain and vomiting. She is also reporting fatigue and overall reduced appetite..   Clinical Assessment and Goals of Care: Patient shows good understanding of not wanting to pursue hemodialysis with her acute on chronic renal failure. She recognizes that it would lead to the end of her life. I did discuss with her hospice services available in the home in quite a bit of detail. Patient is currently refusing hospice in the home stating "I want to maintain my Independence". I explained to her that they in no way would be taking over her independence for autonomy and if anything they would enable her to be functional in her home for a longer period of time. She continues to state that she doesn't want this she is only interested in services that can "drives me to the bank". She is hopeful for discharge today. She is denying any current chest pain, generalized pain, nausea  NEXT OF KIN patient is decisional at this point. Her surrogate decision maker would be her husband in the event she would be unable to speak for herself    SUMMARY OF RECOMMENDATIONS   DO NOT RESUSCITATE DO NOT INTUBATE No hemodialysis Discharge home when medically ready At this point patient is refusing hospice. It is not clear to me what her objection is. She states she will discuss it with her husband Code Status/Advance Care  Planning:  DNR  Palliative Prophylaxis:   Aspiration, Bowel Regimen, Delirium Protocol, Frequent Pain Assessment and Turn Reposition  Additional Recommendations (Limitations, Scope, Preferences):  Full Scope Treatment except for DO NOT RESUSCITATE status, and no hemodialysis  Psycho-social/Spiritual:   Desire for further Chaplaincy support:no  Additional Recommendations: Education on Hospice  Prognosis:   < 6 months in the setting of end-stage renal disease stage V with no plans to pursue dialysis when condition worsens  Discharge Planning: Home. Would recommend that patient be followed by community palliative care services for further goals of care discussion. This can be provided through hospice and palliative care services of [facility] or hospice of the [facility] care connection division      Primary Diagnoses: Present on Admission: . Chest pain . GERD . CKD (chronic kidney disease) stage 5, GFR less than 15 ml/min (HCC) . Normal anion gap metabolic acidosis   I have reviewed the medical record, interviewed the patient and family, and examined the patient. The following aspects are pertinent.  Past Medical History:  Diagnosis Date  . Aortic stenosis   . Bacterial sinusitis 09/17/2011  . CKD (chronic kidney disease) stage 4, GFR 15-29 ml/min (HCC)  08/11/2006   Cr continues to increase. Proteinuria on UA 02/10/12.    . Colitis   . CVA (cerebrovascular accident) [facility])    New hemorrhagic per CT scan '09  . Diverticulosis of colon   . Dysfunctional uterine bleeding   . Fecal impaction ([place])   . Headache(784.0)   . HERNIORRHAPHY, HX OF 08/11/2006  . Hypertension   . OA (osteoarthritis)    bilateral knees  . Postmenopausal   . Pulmonary nodule   . TINEA CRURIS 01/12/2007   Social History   Social History  . Marital status: Married    Spouse name: N/A  . Number of children: N/A  . Years of education: N/A   Social History Main Topics  . Smoking status: Never  Smoker  . Smokeless tobacco: Never Used  . Alcohol use No  . Drug use: No     Comment: 08/15/08 UDS + cocaine  . Sexual activity: Not Asked   Other Topics Concern  . None   Social History Narrative   Married, lives with her husband. 1 child.          Family History  Problem Relation Age of Onset  . Hypertension Mother   . Heart attack Mother   . Heart disease Mother    Scheduled Meds: . amLODipine  10 mg Oral Daily  . aspirin EC  81 mg Oral Daily  . calcitRIOL  0.25 mcg Oral Daily  . calcium acetate  1,334 mg Oral Q breakfast  . carvedilol  12.5 mg Oral BID WC  . cinacalcet  60 mg Oral Q breakfast  . furosemide  40 mg Oral Daily  . heparin  5,000 Units Subcutaneous Q8H  . sevelamer carbonate  800 mg Oral TID WC  . sodium bicarbonate  1,300 mg Oral BID   Continuous Infusions: PRN Meds:.acetaminophen, bisacodyl Medications Prior to Admission:  Prior to Admission medications   Medication Sig Start Date End Date Taking? Authorizing Provider  acetaminophen (TYLENOL) 500 MG tablet Take 1,000 mg by mouth every 8 (eight) hours as needed for mild pain, moderate pain, fever or headache.    Yes Historical Provider, MD  amLODipine (NORVASC) 10 MG tablet Take 1 tablet (10 mg total) by mouth daily. 10/11/15  Yes Sid Falcon, MD  calcitRIOL (ROCALTROL) 0.25 MCG capsule Take 0.25 mcg by mouth daily.   Yes Historical Provider, MD  calcium acetate (PHOSLO) 667 MG capsule Take 2 capsules by mouth daily. 04/08/16  Yes Historical Provider, MD  carvedilol (COREG) 12.5 MG tablet Take 12.5 mg by mouth 2 (two) times daily with a meal.   Yes Historical Provider, MD  diclofenac sodium (VOLTAREN) 1 % GEL Apply 2 g topically 4 (four) times daily as needed. 06/18/16  Yes Sid Falcon, MD  furosemide (LASIX) 40 MG tablet Take 1 tablet (40 mg total) by mouth daily. 02/07/16  Yes Oval Linsey, MD  RENVELA 800 MG tablet Take 800 mg by mouth 3 (three) times daily with meals.  05/06/16  Yes Historical  Provider, MD  SENSIPAR 60 MG tablet Take 60 mg by mouth daily. 05/03/16  Yes Historical Provider, MD  sodium bicarbonate 650 MG tablet Take 1 tablet (650 mg total) by mouth 2 (two) times daily. 05/22/16  Yes Asencion Partridge, MD  traMADol (ULTRAM) 50 MG tablet Take 1 tablet (50 mg total) by mouth every 12 (twelve) hours as needed. 06/25/16 06/25/17 Yes Norman Herrlich, MD  famotidine (PEPCID) 40 MG tablet Take 1 tablet (40 mg total)  by mouth daily. Patient not taking: Reported on 09/21/2016 07/23/16   Sid Falcon, MD  ondansetron (ZOFRAN ODT) 4 MG disintegrating tablet Take 1 tablet (4 mg total) by mouth every 8 (eight) hours as needed for nausea or vomiting. Patient not taking: Reported on 09/21/2016 06/21/16   Duffy Bruce, MD  polyethylene glycol [facility] / Floria Raveling) packet Take 17 g by mouth daily. Patient not taking: Reported on 09/21/2016 08/27/13   Corky Sox, MD   Allergies  Allergen Reactions  . Hydrocodone-Acetaminophen Nausea And Vomiting and Other (See Comments)    Dizziness   Review of Systems  Physical Exam  Vital Signs: BP (!) 135/56   Pulse 95   Temp 98.3 F (36.8 C) (Oral)   Resp 18   Ht 5\' 3"  (1.6 m)   Wt 76.7 kg (169 lb)   SpO2 99%   BMI 29.94 kg/m  Pain Assessment: No/denies pain   Pain Score: 0-No pain   SpO2: SpO2: 99 % O2 Device:SpO2: 99 % O2 Flow Rate: .O2 Flow Rate (L/min): 2 L/min  IO: Intake/output summary: No intake or output data in the 24 hours ending 09/22/16 1136  LBM: Last BM Date: 09/21/16 Baseline Weight: Weight: 76.7 kg (169 lb) Most recent weight: Weight: 76.7 kg (169 lb)     Palliative Assessment/Data:   Flowsheet Rows     Most Recent Value  Intake Tab  Referral Department  Hospitalist  Unit at Time of Referral  Med/Surg Unit  Palliative Care Primary Diagnosis  Cardiac  Date Notified  09/21/16  Palliative Care Type  New Palliative care  Reason for referral  Clarify Goals of Care, Counsel Regarding Hospice  Date of Admission   09/21/16  Date first seen by Palliative Care  09/22/16  # of days Palliative referral response time  1 Day(s)  # of days IP prior to Palliative referral  0  Clinical Assessment  Palliative Performance Scale Score  40%  Pain Max last 24 hours  0  Pain Min Last 24 hours  0  Dyspnea Max Last 24 Hours  0  Dyspnea Min Last 24 hours  0  Nausea Max Last 24 Hours  0  Nausea Min Last 24 Hours  0  Anxiety Max Last 24 Hours  0  Anxiety Min Last 24 Hours  0  Psychosocial & Spiritual Assessment  Palliative Care Outcomes  Patient/Family meeting held?  Yes  Who was at the meeting?  pt  Palliative Care Outcomes  Provided psychosocial or spiritual support, Counseled regarding hospice  Patient/Family wishes: Interventions discontinued/not started   Hemodialysis  Palliative Care follow-up planned  No      Time In: 1055 Time Out: 1145 Time Total: 50 min Greater than 50%  of this time was spent counseling and coordinating care related to the above assessment and plan. Paged Dr Conley Canal  Signed by: Dory Horn, NP   Please contact Palliative Medicine Team phone at [phone] for questions and concerns.  For individual provider: See Shea Evans

## 2016-09-22 NOTE — Care Management Note (Signed)
Case Management Note  Patient Details  Name: Nancy [name] MRN: [id number] Date of Birth: September 26, 1940  Subjective/Objective:   Pt admitted with CP                   Action/Plan:  Pt alert and oriented - states her daughter will transport her from hospital to home with husband.  Pt is blind however states she is completely independent - pt maneuvering well in room alone.  Pt declined hospice services with [facility].   CM also spoke with pt about home with hospice, HH and palliative following in the home - pt very  Pleasantly declined all services- stated "I just don't think I need them right now - CM informed pt that if she changes her mind she can ask her PCP to set it up in the future.  Pt states she has active relationship with PCP and stated she can afford her medications as prescribed.     Expected Discharge Date:  09/22/16               Expected Discharge Plan:  [place]  In-House Referral:     Discharge planning Services  CM Consult  Post Acute Care Choice:    Choice offered to:     DME Arranged:    DME Agency:     HH Arranged:    [place] Agency:     Status of Service:  Completed, signed off  If discussed at [employer], dates discussed:    Additional Comments:  Maryclare Labrador, RN 09/22/2016, 12:46 PM

## 2016-09-23 ENCOUNTER — Telehealth: Payer: Self-pay | Admitting: Internal Medicine

## 2016-09-23 NOTE — Telephone Encounter (Signed)
Needs TOC discharge date 09/22/16 HFU 10/01/16

## 2016-09-25 ENCOUNTER — Ambulatory Visit: Payer: Medicare Other | Admitting: Podiatry

## 2016-09-25 ENCOUNTER — Other Ambulatory Visit: Payer: Self-pay

## 2016-09-25 NOTE — Telephone Encounter (Signed)
Spoke w/ tequila, got cut off, she is at work, lm for rtc

## 2016-09-25 NOTE — Telephone Encounter (Signed)
Pt's daughter called back and gave her each one of pt's meds and when to take, she is [facility], have called don, he will call back tomorrow

## 2016-09-25 NOTE — Telephone Encounter (Signed)
Pt granddaughter needs to speak with a nurse about meds. Please call back.

## 2016-09-26 NOTE — Telephone Encounter (Signed)
rtc, no answer

## 2016-09-26 NOTE — Telephone Encounter (Signed)
Received refill request from pharmacy for amlodipine 10mg  tabs take one daily.  Request sent to pcp for review, please advise.Despina Hidden Cassady3/22/20184:47 PM

## 2016-09-26 NOTE — Telephone Encounter (Signed)
[facility], lm for don that pt needs some help with taking meds appropriately, lm again for tequila

## 2016-09-26 NOTE — Telephone Encounter (Signed)
Tequila calling back to speak with helen about meds

## 2016-09-27 MED ORDER — AMLODIPINE BESYLATE 10 MG PO TABS: 10.0000 mg | ORAL_TABLET | Freq: Every day | ORAL | 5 refills | Status: DC

## 2016-09-30 NOTE — Telephone Encounter (Signed)
Transition Care Management Follow-up Telephone Call   Date discharged? 09/22/2016   How have you been since you were released from the hospital? No     Do you understand why you were in the hospital? yes   Do you understand the discharge instructions? yes   Where were you discharged to? Home   Items Reviewed:  Medications reviewed: declined "I have an appointment tomorrow but I haven't got my blood pressure medicines"  Allergies reviewed: yes  Dietary changes reviewed: yes  Referrals reviewed: no   Functional Questionnaire:   Activities of Daily Living (ADLs):   She states they are independent in the following: ambulation, bathing and hygiene, feeding, continence, grooming, toileting and dressing States they require assistance with the following: none   Any transportation issues/concerns?: yes   Any patient concerns? yes   Confirmed importance and date/time of follow-up visits scheduled yes  Provider Appointment booked with  Confirmed with patient if condition begins to worsen call PCP or go to the ER.  Patient was given the office number and encouraged to call back with question or concerns.  : yes

## 2016-10-01 ENCOUNTER — Telehealth: Payer: Self-pay | Admitting: *Deleted

## 2016-10-01 ENCOUNTER — Ambulatory Visit (INDEPENDENT_AMBULATORY_CARE_PROVIDER_SITE_OTHER): Payer: Medicare Other | Admitting: Internal Medicine

## 2016-10-01 ENCOUNTER — Encounter (HOSPITAL_COMMUNITY)
Admission: RE | Admit: 2016-10-01 | Discharge: 2016-10-01 | Disposition: A | Discharge status: Home / Self Care | Payer: Medicare Other | Source: Ambulatory Visit | Attending: Nephrology | Admitting: Nephrology

## 2016-10-01 VITALS — BP 176/85 | HR 89 | Temp 97.6°F | Ht 63.0 in | Wt 143.4 lb

## 2016-10-01 DIAGNOSIS — I10 Essential (primary) hypertension: Secondary | ICD-10-CM | POA: Diagnosis not present

## 2016-10-01 DIAGNOSIS — N185 Chronic kidney disease, stage 5: Secondary | ICD-10-CM

## 2016-10-01 DIAGNOSIS — Z79899 Other long term (current) drug therapy: Secondary | ICD-10-CM | POA: Diagnosis not present

## 2016-10-01 DIAGNOSIS — Z7189 Other specified counseling: Secondary | ICD-10-CM | POA: Diagnosis not present

## 2016-10-01 DIAGNOSIS — D631 Anemia in chronic kidney disease: Secondary | ICD-10-CM | POA: Diagnosis not present

## 2016-10-01 LAB — POCT HEMOGLOBIN-HEMACUE: HEMOGLOBIN: 11.2 g/dL — AB (ref 12.0–15.0)

## 2016-10-01 MED ORDER — DARBEPOETIN ALFA 200 MCG/0.4ML IJ SOSY
200.0000 ug | PREFILLED_SYRINGE | INTRAMUSCULAR | Status: DC
Start: 2016-10-01 — End: 2016-10-02
  Administered 2016-10-01: 200 ug via SUBCUTANEOUS

## 2016-10-01 MED ORDER — DARBEPOETIN ALFA 200 MCG/0.4ML IJ SOSY
PREFILLED_SYRINGE | INTRAMUSCULAR | Status: AC
  Filled 2016-10-01: qty 0.4

## 2016-10-01 MED ORDER — AMLODIPINE BESYLATE 10 MG PO TABS: 10.0000 mg | ORAL_TABLET | Freq: Every day | ORAL | 5 refills | Status: DC

## 2016-10-01 NOTE — Telephone Encounter (Signed)
Pt's grandaughter calls and states she thinks pt has not taken

## 2016-10-01 NOTE — Patient Instructions (Signed)
Start taking Norvasc 10mg  for your blood pressure.

## 2016-10-01 NOTE — Assessment & Plan Note (Signed)
Assessment: Blood pressure elevated. She has not been taking her Norvasc 10 mg. she is also on Coreg 12.5 mg twice a day and Lasix 40 mg twice a day.  Plan: Refilled Norvasc 10 mg. Follow-up in 2 weeks for blood pressure check.

## 2016-10-01 NOTE — Telephone Encounter (Signed)
Nancy [name] called me back he will make contact w/ tequilla and pt for a plan that will help keep track of pt meds and assist family

## 2016-10-01 NOTE — Assessment & Plan Note (Signed)
Per discharge summary it was recommended that hospice care was discussed with patient. When this was brought up during encounter pt quickly said no and that she does not want it. Shelives with her husband and gets around fine, they both help take care of each other. She has an appt with PCP in April and can revisit this topic again.

## 2016-10-01 NOTE — Progress Notes (Signed)
   CC: HTN  HPI:  Ms.Kayson B Goral is a 76 y.o.  with PMHx as outlined below who presents to clinic for HTN follow up. Please see problem list for further details of patient's chronic medical issues.   Of note patient was recently admitted to the hospital on March 17 and 18th for chest pain. Troponins were negative. Patient refused palliative care. He also follows with [facility] and does not want hemodialysis.  Past Medical History:  Diagnosis Date  . Aortic stenosis   . Bacterial sinusitis 09/17/2011  . CKD (chronic kidney disease) stage 4, GFR 15-29 ml/min ([place]) 08/11/2006   Cr continues to increase. Proteinuria on UA 02/10/12.    . Colitis   . CVA (cerebrovascular accident) [facility])    New hemorrhagic per CT scan '09  . Diverticulosis of colon   . Dysfunctional uterine bleeding   . Fecal impaction ([place])   . Headache(784.0)   . HERNIORRHAPHY, HX OF 08/11/2006  . Hypertension   . OA (osteoarthritis)    bilateral knees  . Postmenopausal   . Pulmonary nodule   . TINEA CRURIS 01/12/2007    Review of Systems:  Denies chest pain, nausea, vomiting, shortness of breath, blurry vision. She has a frontal headache and pressure behind her eyes and last for about 10 seconds feels like a toothache, Resolves with Tylenol.  Physical Exam:  Vitals:   10/01/16 0935  BP: (!) 178/81  Pulse: 89  Temp: 97.6 F (36.4 C)  TempSrc: Oral  SpO2: 100%  Weight: 143 lb 6.4 oz (65 kg)  Height: 5\' 3"  (1.6 m)   Physical Exam  Constitutional:  appears well-developed and well-nourished. No distress.  HENT:  Head: Normocephalic and atraumatic.  Nose: Nose normal.  Cardiovascular: Normal rate, regular rhythm.  3/6 LUSB systolic murmur No murmur heard. Pulmonary/Chest: Effort normal and breath sounds normal. No respiratory distress.  has no wheezes.no rales.  Abdominal: Soft. Bowel sounds are normal.  exhibits no distension. There is no tenderness. There is no rebound and no guarding.  Neurological:  alert and oriented to person, place, and time.  Skin: Skin is warm and dry. No rash noted.  not diaphoretic. No erythema. No pallor.   Assessment & Plan:   See Encounters Tab for problem based charting.  Patient discussed with Dr. Lynnae January

## 2016-10-02 NOTE — Progress Notes (Signed)
Internal Medicine Clinic Attending  Case discussed with Dr. Truong at the time of the visit.  We reviewed the resident's history and exam and pertinent patient test results.  I agree with the assessment, diagnosis, and plan of care documented in the resident's note.  

## 2016-10-07 ENCOUNTER — Telehealth: Payer: Self-pay

## 2016-10-07 DIAGNOSIS — I120 Hypertensive chronic kidney disease with stage 5 chronic kidney disease or end stage renal disease: Secondary | ICD-10-CM | POA: Diagnosis not present

## 2016-10-07 DIAGNOSIS — I639 Cerebral infarction, unspecified: Secondary | ICD-10-CM

## 2016-10-07 DIAGNOSIS — N2581 Secondary hyperparathyroidism of renal origin: Secondary | ICD-10-CM | POA: Diagnosis not present

## 2016-10-07 DIAGNOSIS — N185 Chronic kidney disease, stage 5: Secondary | ICD-10-CM | POA: Diagnosis not present

## 2016-10-07 DIAGNOSIS — D631 Anemia in chronic kidney disease: Secondary | ICD-10-CM | POA: Diagnosis not present

## 2016-10-07 NOTE — Telephone Encounter (Signed)
Rodena Piety( case Midwife) with [employer] need to speak with a nurse about pt. Please call back  [phone], ext: [phone].

## 2016-10-07 NOTE — Telephone Encounter (Signed)
Returned call to Rodena Piety at [facility] - stated she had talked to pt after her discharge about any needs. Pt told her she has a problem w/her vision and sometimes unsure if taking medications correctly. And her husband unable to help. Told her, pt may need SW referral to see what kind of service pt qualifies. I will send her concern to Dr Daryll Drown.

## 2016-10-08 NOTE — Telephone Encounter (Signed)
Placed referral for SW review.  Let me know if anything else is needed.

## 2016-10-09 ENCOUNTER — Other Ambulatory Visit: Payer: Self-pay | Admitting: *Deleted

## 2016-10-09 DIAGNOSIS — I5020 Unspecified systolic (congestive) heart failure: Secondary | ICD-10-CM | POA: Diagnosis not present

## 2016-10-09 DIAGNOSIS — N184 Chronic kidney disease, stage 4 (severe): Secondary | ICD-10-CM | POA: Diagnosis not present

## 2016-10-09 NOTE — Patient Outreach (Signed)
[facility]) Care Management  10/09/2016  [place] B Ragone 1941-01-03 [id number]   Referral received from care management assistant via Dr. Hilma Favors, inpatient hospice/palliative care MD.  Per order, request was made to connect member with [facility], home based palliative care program with [facility]osp[facility] and [facility] of the [place].  Member was recently hospitalized from 3/17-3/18 for chest pain.  According to chart, she has history of chronic kidney disease, in need of dialysis.  However, member has refused hemodialysis.  Hospice/palliative care was suggested, member declined.    Call placed to primary MD office, spoke to H. Buena Irish, to obtain clarification regarding referral as chart state the topic will be discussed again during the member office visit next week.  She report that the member's granddaughter has been the best contact person to provide the most accurate information regarding needs for the member.  Contact information for [employer] obtained.    Call then placed to T. Volanda Napoleon, member's granddaughter.  Patient's identity verified, this care manager introduced self, stated purpose of call.  She is aware that the member has declined hemodialysis and state that both she and her mother, member's daughter, has tried to discuss the importance of treatment.  She state that the member has continued to refuse.  She is aware that palliative care/hospice was suggested, but does not want referral placed until she speak with the member.  She will discuss with her tonight and will call this care manager back tomorrow.  If the member is receptive to palliative care, will refer to care connections.  If not, will assess for further [facility] involvement.  Will await call back.  [name], [place], MSN [place] [phone]

## 2016-10-10 MED ORDER — CALCITRIOL 0.25 MCG PO CAPS: 0.2500 ug | ORAL_CAPSULE | Freq: Every day | ORAL | 0 refills | Status: DC

## 2016-10-15 ENCOUNTER — Encounter: Payer: Self-pay | Admitting: Internal Medicine

## 2016-10-15 ENCOUNTER — Ambulatory Visit (INDEPENDENT_AMBULATORY_CARE_PROVIDER_SITE_OTHER): Payer: Medicare Other | Admitting: Internal Medicine

## 2016-10-15 ENCOUNTER — Encounter (HOSPITAL_COMMUNITY)
Admission: RE | Admit: 2016-10-15 | Discharge: 2016-10-15 | Disposition: A | Discharge status: Home / Self Care | Payer: Medicare Other | Source: Ambulatory Visit | Attending: Nephrology | Admitting: Nephrology

## 2016-10-15 VITALS — BP 145/68 | HR 93 | Temp 97.6°F | Wt 143.1 lb

## 2016-10-15 DIAGNOSIS — K5900 Constipation, unspecified: Secondary | ICD-10-CM

## 2016-10-15 DIAGNOSIS — R1013 Epigastric pain: Secondary | ICD-10-CM

## 2016-10-15 DIAGNOSIS — Z8679 Personal history of other diseases of the circulatory system: Secondary | ICD-10-CM

## 2016-10-15 DIAGNOSIS — N185 Chronic kidney disease, stage 5: Secondary | ICD-10-CM | POA: Insufficient documentation

## 2016-10-15 DIAGNOSIS — D631 Anemia in chronic kidney disease: Secondary | ICD-10-CM | POA: Diagnosis not present

## 2016-10-15 DIAGNOSIS — I120 Hypertensive chronic kidney disease with stage 5 chronic kidney disease or end stage renal disease: Secondary | ICD-10-CM | POA: Diagnosis not present

## 2016-10-15 DIAGNOSIS — I10 Essential (primary) hypertension: Secondary | ICD-10-CM

## 2016-10-15 DIAGNOSIS — K5909 Other constipation: Secondary | ICD-10-CM | POA: Diagnosis not present

## 2016-10-15 DIAGNOSIS — K219 Gastro-esophageal reflux disease without esophagitis: Secondary | ICD-10-CM

## 2016-10-15 DIAGNOSIS — Z79899 Other long term (current) drug therapy: Secondary | ICD-10-CM

## 2016-10-15 DIAGNOSIS — Z8673 Personal history of transient ischemic attack (TIA), and cerebral infarction without residual deficits: Secondary | ICD-10-CM | POA: Diagnosis not present

## 2016-10-15 LAB — IRON AND TIBC
Iron: 33 ug/dL (ref 28–170)
SATURATION RATIOS: 16 % (ref 10.4–31.8)
TIBC: 202 ug/dL — AB (ref 250–450)
UIBC: 169 ug/dL

## 2016-10-15 LAB — FERRITIN: Ferritin: 127 ng/mL (ref 11–307)

## 2016-10-15 LAB — POCT HEMOGLOBIN-HEMACUE: HEMOGLOBIN: 11.1 g/dL — AB (ref 12.0–15.0)

## 2016-10-15 MED ORDER — CLONIDINE HCL 0.1 MG/24HR TD PTWK: 0.1000 mg | MEDICATED_PATCH | TRANSDERMAL | 0 refills | Status: DC

## 2016-10-15 MED ORDER — DOCUSATE SODIUM 100 MG PO CAPS: 100.0000 mg | ORAL_CAPSULE | Freq: Two times a day (BID) | ORAL | 0 refills | Status: DC

## 2016-10-15 MED ORDER — FAMOTIDINE 20 MG PO TABS: 20.0000 mg | ORAL_TABLET | Freq: Two times a day (BID) | ORAL | 0 refills | Status: DC

## 2016-10-15 MED ORDER — DARBEPOETIN ALFA 200 MCG/0.4ML IJ SOSY
200.0000 ug | PREFILLED_SYRINGE | INTRAMUSCULAR | Status: DC
  Administered 2016-10-15: 09:00:00 200 ug via SUBCUTANEOUS

## 2016-10-15 MED ORDER — DARBEPOETIN ALFA 200 MCG/0.4ML IJ SOSY
PREFILLED_SYRINGE | INTRAMUSCULAR | Status: AC
  Filled 2016-10-15: qty 0.4

## 2016-10-15 NOTE — Progress Notes (Signed)
   CC: Follow up for hypertension  HPI:  [name] is a 76 y.o. woman here today for evaluation after recent increases to her antihypertensive medication with amlodipine 10mg  daily and carvedilol to 25mg  BID.   See problem based assessment and plan below for additional details  Past Medical History:  Diagnosis Date  . Aortic stenosis   . Bacterial sinusitis 09/17/2011  . CKD (chronic kidney disease) stage 4, GFR 15-29 ml/min ([place]) 08/11/2006   Cr continues to increase. Proteinuria on UA 02/10/12.    . Colitis   . CVA (cerebrovascular accident) [facility])    New hemorrhagic per CT scan '09  . Diverticulosis of colon   . Dysfunctional uterine bleeding   . Fecal impaction (Nancy [name])   . Headache(784.0)   . HERNIORRHAPHY, HX OF 08/11/2006  . Hypertension   . OA (osteoarthritis)    bilateral knees  . Postmenopausal   . Pulmonary nodule   . TINEA CRURIS 01/12/2007    Review of Systems:  Review of Systems  Constitutional: Negative for fever.  Respiratory: Negative for shortness of breath.   Cardiovascular: Positive for chest pain. Negative for leg swelling.  Gastrointestinal: Positive for abdominal pain.  Skin: Negative for rash.  Neurological: Negative for dizziness and headaches.    Physical Exam: Physical Exam  Constitutional: She is well-developed, well-nourished, and in no distress.  Cardiovascular: Normal rate and regular rhythm.   Pulmonary/Chest: Effort normal and breath sounds normal.  Abdominal: Soft.  Mild epigastric tenderness to palpation  Musculoskeletal: She exhibits no edema.  Skin: No rash noted.    Vitals:   10/15/16 0918  BP: (!) 145/68  Pulse: 93  Temp: 97.6 F (36.4 C)  TempSrc: Oral  SpO2: 100%  Weight: 143 lb 1.6 oz (64.9 kg)    Assessment & Plan:   See Encounters Tab for problem based charting.  Patient discussed with Dr. Angelia Mould

## 2016-10-15 NOTE — Patient Instructions (Addendum)
It was a pleasure to see you today [name].  For your high blood pressure I recommend starting to use a clonidine patch changed weekly. If this is reported to be expensive by your pharmacy please call us back immediately so we can look at other options. Also let us know if you have any of the problem we talked about today such as lightheadedness.  For your abdominal pain I think there is a component of acid reflux disease worsening this. I recommend changing your use of famotidine (Pepcid) to 20mg  (1 tablet) twice daily. I have also prescribed a stool softener docusate to take with this to relieve your constipation. This is important since chronic straining and constipation increases your risk for complications with diverticulosis.  You have an appointment on April 24 with Dr. Daryll Drown. If your abdominal pain is getting much worse before then we can see you again otherwise she can follow up these medication changes.

## 2016-10-16 ENCOUNTER — Encounter: Payer: Self-pay | Admitting: *Deleted

## 2016-10-16 ENCOUNTER — Other Ambulatory Visit: Payer: Self-pay | Admitting: *Deleted

## 2016-10-16 DIAGNOSIS — I5020 Unspecified systolic (congestive) heart failure: Secondary | ICD-10-CM | POA: Diagnosis not present

## 2016-10-16 DIAGNOSIS — N184 Chronic kidney disease, stage 4 (severe): Secondary | ICD-10-CM | POA: Diagnosis not present

## 2016-10-16 NOTE — Patient Outreach (Signed)
[facility]) Care Management  10/16/2016  Nancy [name] 1940-10-04 [id number]   Call placed to member's granddaughter to follow up on possible involvement versus connection with palliative care program.  She report that she did have conversation with member and despite explaining difference between hospice and palliative care multiple times, member continue to refuse involvement.  She state that the member has home health care involvement and is doing well since her discharge.  She state that the member has family support as well, denies the need for [facility] involvement.  Advised to keep this care manager's contact information, contact if needs change or if additional assistance is needed once home health is signed off.  Will notify care management assistant and primary MD of case closure.  [name], [place], MSN [place] [phone]

## 2016-10-18 ENCOUNTER — Ambulatory Visit: Payer: Medicare Other | Admitting: Podiatry

## 2016-10-18 NOTE — Assessment & Plan Note (Signed)
I suspect her chest/epigastric pain is probably due to GERD since it is reproducible on exam and not worsened with activity. I recommended she try taking pepcid BID. PPI therapy might be an option but I am somewhat cautious due to her CKD5.

## 2016-10-18 NOTE — Assessment & Plan Note (Signed)
HPI: She was recently restarted on her amlodipine 10mg  daily and is taking coreg and lasix twice daily. She has no symptoms of orthostatic hypotension or decreased exertional capacity with these.  A: Mildly uncontrolled blood pressure at 145/68 I feel that tight control is appropriate for Nancy [name] considering her history of advanced nephropathy, CVA, angina, however we could back off if she has any symptoms from hypotension. She has missed doses of medicine intermittently in the past so I would consider a transdermal administration route as an option.  P: Continue amlodipine 10mg , coreg 12.5mg  BID, lasix 40mg  BID Start clonidine TD 0.1mg /day weekly RTC planned 4/24 with PCP Call back sooner if having any problems

## 2016-10-18 NOTE — Assessment & Plan Note (Signed)
She has a chronic history of constipation complicated in the past by fecal impaction. She was prescribed miralax in the past but is not taking this. She is having bowel movements about every other day. I recommended starting docusate daily for now until her bowel movements become more regular.

## 2016-10-21 NOTE — Progress Notes (Signed)
Internal Medicine Clinic Attending  Case discussed with Dr. Rice at the time of the visit.  We reviewed the resident's history and exam and pertinent patient test results.  I agree with the assessment, diagnosis, and plan of care documented in the resident's note.  

## 2016-10-22 DIAGNOSIS — I5020 Unspecified systolic (congestive) heart failure: Secondary | ICD-10-CM | POA: Diagnosis not present

## 2016-10-22 DIAGNOSIS — N184 Chronic kidney disease, stage 4 (severe): Secondary | ICD-10-CM | POA: Diagnosis not present

## 2016-10-25 ENCOUNTER — Ambulatory Visit: Payer: Medicare Other | Admitting: Podiatry

## 2016-10-28 ENCOUNTER — Other Ambulatory Visit (HOSPITAL_COMMUNITY): Payer: Self-pay | Admitting: *Deleted

## 2016-10-28 ENCOUNTER — Telehealth: Payer: Self-pay | Admitting: Internal Medicine

## 2016-10-28 NOTE — Telephone Encounter (Signed)
APT. REMINDER CALL, LMTCB °

## 2016-10-29 ENCOUNTER — Ambulatory Visit (INDEPENDENT_AMBULATORY_CARE_PROVIDER_SITE_OTHER): Payer: Medicare Other | Admitting: Internal Medicine

## 2016-10-29 ENCOUNTER — Encounter: Payer: Self-pay | Admitting: Internal Medicine

## 2016-10-29 ENCOUNTER — Encounter (HOSPITAL_COMMUNITY)
Admission: RE | Admit: 2016-10-29 | Discharge: 2016-10-29 | Disposition: A | Discharge status: Home / Self Care | Payer: Medicare Other | Source: Ambulatory Visit | Attending: Nephrology | Admitting: Nephrology

## 2016-10-29 VITALS — BP 176/89 | HR 95 | Temp 97.8°F | Ht 63.0 in | Wt 140.8 lb

## 2016-10-29 DIAGNOSIS — Z9842 Cataract extraction status, left eye: Secondary | ICD-10-CM

## 2016-10-29 DIAGNOSIS — M1710 Unilateral primary osteoarthritis, unspecified knee: Secondary | ICD-10-CM

## 2016-10-29 DIAGNOSIS — M25461 Effusion, right knee: Secondary | ICD-10-CM | POA: Diagnosis not present

## 2016-10-29 DIAGNOSIS — D631 Anemia in chronic kidney disease: Secondary | ICD-10-CM | POA: Diagnosis not present

## 2016-10-29 DIAGNOSIS — I700 Atherosclerosis of aorta: Secondary | ICD-10-CM

## 2016-10-29 DIAGNOSIS — I10 Essential (primary) hypertension: Secondary | ICD-10-CM

## 2016-10-29 DIAGNOSIS — I208 Other forms of angina pectoris: Secondary | ICD-10-CM

## 2016-10-29 DIAGNOSIS — IMO0002 Reserved for concepts with insufficient information to code with codable children: Secondary | ICD-10-CM

## 2016-10-29 DIAGNOSIS — N185 Chronic kidney disease, stage 5: Secondary | ICD-10-CM

## 2016-10-29 DIAGNOSIS — Z9114 Patient's other noncompliance with medication regimen: Secondary | ICD-10-CM

## 2016-10-29 DIAGNOSIS — I209 Angina pectoris, unspecified: Secondary | ICD-10-CM | POA: Diagnosis not present

## 2016-10-29 DIAGNOSIS — K219 Gastro-esophageal reflux disease without esophagitis: Secondary | ICD-10-CM | POA: Diagnosis not present

## 2016-10-29 DIAGNOSIS — H409 Unspecified glaucoma: Secondary | ICD-10-CM

## 2016-10-29 DIAGNOSIS — I120 Hypertensive chronic kidney disease with stage 5 chronic kidney disease or end stage renal disease: Secondary | ICD-10-CM | POA: Diagnosis not present

## 2016-10-29 DIAGNOSIS — M1711 Unilateral primary osteoarthritis, right knee: Secondary | ICD-10-CM | POA: Diagnosis not present

## 2016-10-29 DIAGNOSIS — E872 Acidosis, unspecified: Secondary | ICD-10-CM

## 2016-10-29 DIAGNOSIS — Z9841 Cataract extraction status, right eye: Secondary | ICD-10-CM

## 2016-10-29 DIAGNOSIS — I350 Nonrheumatic aortic (valve) stenosis: Secondary | ICD-10-CM | POA: Diagnosis not present

## 2016-10-29 LAB — POCT HEMOGLOBIN-HEMACUE: Hemoglobin: 10.9 g/dL — ABNORMAL LOW (ref 12.0–15.0)

## 2016-10-29 LAB — SYNOVIAL CELL COUNT + DIFF, W/ CRYSTALS
Crystals, Fluid: NONE SEEN
Lymphocytes-Synovial Fld: 17 % (ref 0–20)
Monocyte-Macrophage-Synovial Fluid: 81 % (ref 50–90)
NEUTROPHIL, SYNOVIAL: 2 % (ref 0–25)
WBC, SYNOVIAL: 56 /mm3 (ref 0–200)

## 2016-10-29 MED ORDER — SODIUM BICARBONATE 650 MG PO TABS: 650.0000 mg | ORAL_TABLET | Freq: Two times a day (BID) | ORAL | 1 refills | Status: DC

## 2016-10-29 MED ORDER — SODIUM CHLORIDE 0.9 % IV SOLN
510.0000 mg | Freq: Once | INTRAVENOUS | Status: AC
  Administered 2016-10-29: 08:00:00 510 mg via INTRAVENOUS
  Filled 2016-10-29: qty 17

## 2016-10-29 MED ORDER — DARBEPOETIN ALFA 200 MCG/0.4ML IJ SOSY
PREFILLED_SYRINGE | INTRAMUSCULAR | Status: AC
  Administered 2016-10-29: 200 ug via SUBCUTANEOUS
  Filled 2016-10-29: qty 0.4

## 2016-10-29 MED ORDER — DARBEPOETIN ALFA 200 MCG/0.4ML IJ SOSY
200.0000 ug | PREFILLED_SYRINGE | INTRAMUSCULAR | Status: DC
  Administered 2016-10-29: 200 ug via SUBCUTANEOUS

## 2016-10-29 NOTE — Assessment & Plan Note (Signed)
She is supposed to be on bicarb BID, which she reports taking but is not on our current med list.  Will add to med list.

## 2016-10-29 NOTE — Assessment & Plan Note (Signed)
She has chest pain which has been deemed atypical in the past.  She has an Rx for nitro, but she notes not taking it recently.  She was recently admitted for chest pain which was attributed to GERD.  She denies any symptoms or concerns today.   Continue to monitor - needs better BP control as noted below.

## 2016-10-29 NOTE — Patient Instructions (Addendum)
Ms. Leccese - -  Please continue to take all of your medications as prescribed which are included in this paperwork.   Please continue to take your blood pressure medications as your blood pressure was high today.   We will be transitioning your medications to a pharmacy where you can get them in blister packs.    Please come back to see me in about 1 month to check your blood pressure once your medications are consistently getting to you.   Thank you!

## 2016-10-29 NOTE — Assessment & Plan Note (Signed)
She is at the stage where she should be referred for dialysis access, however, she has been resistant to going through this process. She was suggested to seek out palliative care and/or hospice during her last hospital stay.  This will continue to be discussed with her when I see her.  She continues to wish to remain at home to help care for her ailing husband.   I suggested labwork today to see how her phosphorous and renal function are, but she declined.  She will see nephrology either this month or next.

## 2016-10-29 NOTE — Assessment & Plan Note (Signed)
BP is very high today, she reports being out of her medications.  She is requesting blister packs from the pharmacy which I think will help her quite a bit as she seems to have issues with adherence.  She is supposed to be on clonidine patch, which she has on today, lasix 40mg  daily, coreg 25mg  BID and amlodipine 10mg  daily.  She can only reliably say that she is on the clonidine patch.    Plan Restart home meds - send to pharmacy that provides blister pack services  Coreg 25mg  BID Amlodipine 10mg  Daily Clonidine patch 0.1mg  qweekly Lasix 40mg  daily

## 2016-10-29 NOTE — Progress Notes (Signed)
Subjective:    Patient ID: Nancy [name], female    DOB: 02/04/1941, 76 y.o.   MRN: [id number]  CC: Follow up for elevated BP  HPI  I have not personally seen Nancy [name] since 10/12/14 - she has seen other providers in that time and had a few ED and hospital visits: Most recent charts reviewed:   3/27 - BP check, HFU appointment with Dr. Hulen Luster - refilled amlodipine as she was out.  Coreg continued.    Hospital 3/17 - 3/18: Chest pain - ACS ruled out, vomiting, mallory-weiss tear.   Nancy [name] is a 76yo woman with PMH of HTN, GERD, CKD 5, anemia of chronic disease, non AG metabolic acidosis due to renal function, tertiary hyperparathyroidism, osteoarthritis of the knee who presents for follow up.    Her BP is very elevated today.  She reports not having her BP medications as they have not "brought them to her."  She is wearing her clonidine patch today.  She is not aware of being on quite a few of her medications and she is asking for assistance by having blister packs available to her at the pharmacy.  She reports that her current pharmacy does not provide this service.  She is supposed to be on lasix, but reports not taking this for a long time.  She reports making urine.  She is supposed to be on Sesipar, Renvela, calcitriol and calcium acetate based on review of last Nephrology note, but she is not sure if she is taking them.  She is also supposed to be on bicarb BID which she reports taking, but this is not in our system.  I will update our med list based on recent med list from nephrology and contact her nephrologist Dr. Rolland Bimler.    Refused blood work b/c she reports gettign it done this morning in Short stay.  I do not see orders for this but will attempt to track it down.  She has blood work with nephrology q2 months.    Her main symptom that she reports today is knee pain.  She has a history of tricompartmental degenerative disease of the right knee.  She has had a knee injection  in April of 2016 with good results.  She is requesting a steroid injection in the knee today.    Review of Systems  Constitutional: Negative for activity change, fatigue and fever.  Respiratory: Negative for cough, choking and shortness of breath.   Cardiovascular: Negative for chest pain and leg swelling.  Gastrointestinal: Negative for abdominal pain, constipation and diarrhea.  Genitourinary: Negative for difficulty urinating and dysuria.  Musculoskeletal: Positive for arthralgias (right knee).  Neurological: Negative for dizziness, syncope, weakness and light-headedness.       Objective:   Physical Exam  Constitutional: She is oriented to person, place, and time.  Thin elderly woman in NAD  HENT:  Head: Normocephalic and atraumatic.  Cardiovascular: Normal rate and regular rhythm.   Loud systolic murmur at LUSB  Pulmonary/Chest: Effort normal and breath sounds normal. No respiratory distress. She has no wheezes.  Abdominal: Soft. Bowel sounds are normal. She exhibits no distension. There is no tenderness.  Musculoskeletal: She exhibits no edema.  Right knee: + crepitus, ttp at medial and lateral joint line.  Minimal swelling, no warmth.  Left knee with swelling or warmth and non tender to palpation  Neurological: She is alert and oriented to person, place, and time.  Psychiatric: She has a normal mood and affect.  Her behavior is normal.     BMET today with PTH and phosphorous  - patient refused.      Assessment & Plan:  RTC in 1 month for follow up of HTN

## 2016-10-29 NOTE — Assessment & Plan Note (Signed)
Reflux improved.  She has no more hematemesis, nausea.  She reports no pain or reflux on famotidine.   Continue famotidine

## 2016-10-29 NOTE — Assessment & Plan Note (Addendum)
She has knee pain today.  Tylenol helps somewhat.  She is requesting a knee injection with steroids today which will be provided by Dr. Evette Doffing and Dr. Randell Patient in the [facility] today.   Plan Continue tylenol PRN

## 2016-10-29 NOTE — Assessment & Plan Note (Signed)
Loud murmur noted on exam today.  She denies chest pain, syncope, lightheadedness.  She had a TTE in 2014 which showed a low mean gradient (<51mmHg).  She would likely fall in the category of having a follow up TTE every 3-5 years.  Would recommend TTE at next visit.  I think this might be deferred given her severe renal function, but will discuss with her for shared decision making.

## 2016-10-29 NOTE — Assessment & Plan Note (Signed)
She has had cataracts out, reports no change in vision or eye pain.   Ophtho consult today.

## 2016-10-29 NOTE — Progress Notes (Signed)
Knee Arthrocentesis with Injection Procedure Note  Diagnosis: right knee osteoarthritis  Indications: Symptomatic relief of large effusion  Anesthesia: Lidocaine 1% without epinephrine  Procedure Details   Point of care ultrasound was used to identify the joint effusion and plan needle trajectory and depth. We accessed the suprapatellar pouch directly under ultrasound guidance. Consent was obtained for the procedure. The joint was prepped with Betadine. A 22 gauge needle was inserted into the superior aspect of the joint from a medial approach to access the suprapatellar pouch. 20 ml of clear yellow fluid was removed from the joint and sent to the lab for analysis. 1 ml 1% lidocaine and 40 mg of Triamcinolone was then injected into the joint through the same needle. The needle was removed and the area cleansed and dressed.  Complications:  None; patient tolerated the procedure well.  Transverse view of the right suprapatellar pouch showing a moderate sized effusion. The effusion displaced a lot medially as well. There were small hyperechoic free floating dots in the effusion raising the possibility of CPPD.

## 2016-10-29 NOTE — Assessment & Plan Note (Signed)
Currently asymptomatic, most recently felt to be related to GI disease (GERD).  She has nitro and has not needed it recently.   Continue to monitor.

## 2016-10-30 ENCOUNTER — Telehealth: Payer: Self-pay | Admitting: *Deleted

## 2016-10-30 DIAGNOSIS — N184 Chronic kidney disease, stage 4 (severe): Secondary | ICD-10-CM | POA: Diagnosis not present

## 2016-10-30 DIAGNOSIS — I5020 Unspecified systolic (congestive) heart failure: Secondary | ICD-10-CM | POA: Diagnosis not present

## 2016-10-31 ENCOUNTER — Other Ambulatory Visit: Payer: Self-pay | Admitting: *Deleted

## 2016-11-01 ENCOUNTER — Encounter: Payer: Self-pay | Admitting: Podiatry

## 2016-11-01 ENCOUNTER — Ambulatory Visit (INDEPENDENT_AMBULATORY_CARE_PROVIDER_SITE_OTHER): Payer: Medicare Other | Admitting: Podiatry

## 2016-11-01 DIAGNOSIS — B351 Tinea unguium: Secondary | ICD-10-CM

## 2016-11-01 DIAGNOSIS — M79674 Pain in right toe(s): Secondary | ICD-10-CM | POA: Diagnosis not present

## 2016-11-01 DIAGNOSIS — M79675 Pain in left toe(s): Secondary | ICD-10-CM | POA: Diagnosis not present

## 2016-11-03 NOTE — Progress Notes (Signed)
Subjective: 76 y.o. returns the office today for painful, elongated, thickened toenails which she cannot trim herself. Denies any redness or drainage around the nails. Denies any acute changes since last appointment and no new complaints today. Denies any systemic complaints such as fevers, chills, nausea, vomiting.   Objective: AAO 3, NAD DP/PT pulses palpable, CRT less than 3 seconds  Nails hypertrophic, dystrophic, elongated, brittle, discolored 10. There is tenderness overlying the nails 1-5 bilaterally. There is no surrounding erythema or drainage along the nail sites. No open lesions or pre-ulcerative lesions are identified. No other areas of tenderness bilateral lower extremities. No overlying edema, erythema, increased warmth. No pain with calf compression, swelling, warmth, erythema.  Assessment: Patient presents with symptomatic onychomycosis  Plan: -Treatment options including alternatives, risks, complications were discussed -Nails sharply debrided 10 without complication/bleeding. -Discussed daily foot inspection. If there are any changes, to call the office immediately.  -Follow-up in 3 months or sooner if any problems are to arise. In the meantime, encouraged to call the office with any questions, concerns, changes symptoms.  Celesta Gentile, DPM

## 2016-11-05 DIAGNOSIS — N184 Chronic kidney disease, stage 4 (severe): Secondary | ICD-10-CM | POA: Diagnosis not present

## 2016-11-05 DIAGNOSIS — I5020 Unspecified systolic (congestive) heart failure: Secondary | ICD-10-CM | POA: Diagnosis not present

## 2016-11-05 MED ORDER — FAMOTIDINE 20 MG PO TABS: 20.0000 mg | ORAL_TABLET | Freq: Two times a day (BID) | ORAL | 0 refills | Status: DC

## 2016-11-11 DIAGNOSIS — N184 Chronic kidney disease, stage 4 (severe): Secondary | ICD-10-CM | POA: Diagnosis not present

## 2016-11-11 DIAGNOSIS — I5020 Unspecified systolic (congestive) heart failure: Secondary | ICD-10-CM | POA: Diagnosis not present

## 2016-11-12 ENCOUNTER — Encounter (HOSPITAL_COMMUNITY)
Admission: RE | Admit: 2016-11-12 | Discharge: 2016-11-12 | Disposition: A | Discharge status: Home / Self Care | Payer: Medicare Other | Source: Ambulatory Visit | Attending: Nephrology | Admitting: Nephrology

## 2016-11-12 DIAGNOSIS — D631 Anemia in chronic kidney disease: Secondary | ICD-10-CM | POA: Insufficient documentation

## 2016-11-12 DIAGNOSIS — N185 Chronic kidney disease, stage 5: Secondary | ICD-10-CM | POA: Insufficient documentation

## 2016-11-12 LAB — POCT HEMOGLOBIN-HEMACUE: Hemoglobin: 12.2 g/dL (ref 12.0–15.0)

## 2016-11-12 MED ORDER — DARBEPOETIN ALFA 200 MCG/0.4ML IJ SOSY: 200.0000 ug | PREFILLED_SYRINGE | INTRAMUSCULAR | Status: DC

## 2016-11-18 ENCOUNTER — Other Ambulatory Visit: Payer: Self-pay | Admitting: *Deleted

## 2016-11-18 MED ORDER — CLONIDINE HCL 0.1 MG/24HR TD PTWK: 0.1000 mg | MEDICATED_PATCH | TRANSDERMAL | 0 refills | Status: DC

## 2016-11-26 ENCOUNTER — Encounter (HOSPITAL_COMMUNITY): Payer: Medicare Other

## 2016-12-05 ENCOUNTER — Encounter (HOSPITAL_COMMUNITY)
Admission: RE | Admit: 2016-12-05 | Discharge: 2016-12-05 | Disposition: A | Discharge status: Home / Self Care | Payer: Medicare Other | Source: Ambulatory Visit | Attending: Nephrology | Admitting: Nephrology

## 2016-12-05 DIAGNOSIS — N185 Chronic kidney disease, stage 5: Secondary | ICD-10-CM

## 2016-12-05 DIAGNOSIS — D631 Anemia in chronic kidney disease: Secondary | ICD-10-CM | POA: Diagnosis not present

## 2016-12-05 LAB — POCT HEMOGLOBIN-HEMACUE: Hemoglobin: 10.6 g/dL — ABNORMAL LOW (ref 12.0–15.0)

## 2016-12-05 MED ORDER — DARBEPOETIN ALFA 200 MCG/0.4ML IJ SOSY
200.0000 ug | PREFILLED_SYRINGE | INTRAMUSCULAR | Status: DC
  Administered 2016-12-05: 09:00:00 200 ug via SUBCUTANEOUS

## 2016-12-05 MED ORDER — DARBEPOETIN ALFA 200 MCG/0.4ML IJ SOSY
PREFILLED_SYRINGE | INTRAMUSCULAR | Status: AC
  Filled 2016-12-05: qty 0.4

## 2016-12-08 ENCOUNTER — Observation Stay (HOSPITAL_COMMUNITY): Payer: Medicare Other

## 2016-12-08 ENCOUNTER — Emergency Department (HOSPITAL_COMMUNITY): Payer: Medicare Other

## 2016-12-08 ENCOUNTER — Encounter (HOSPITAL_COMMUNITY): Payer: Self-pay | Admitting: Emergency Medicine

## 2016-12-08 ENCOUNTER — Inpatient Hospital Stay (HOSPITAL_COMMUNITY)
Admission: EM | Admit: 2016-12-08 | Discharge: 2016-12-10 | DRG: 064 | Disposition: A | Discharge status: Home / Self Care | Payer: Medicare Other | Attending: Student in an Organized Health Care Education/Training Program | Admitting: Student in an Organized Health Care Education/Training Program

## 2016-12-08 DIAGNOSIS — I2510 Atherosclerotic heart disease of native coronary artery without angina pectoris: Secondary | ICD-10-CM | POA: Diagnosis present

## 2016-12-08 DIAGNOSIS — Z87891 Personal history of nicotine dependence: Secondary | ICD-10-CM

## 2016-12-08 DIAGNOSIS — M170 Bilateral primary osteoarthritis of knee: Secondary | ICD-10-CM | POA: Diagnosis present

## 2016-12-08 DIAGNOSIS — E785 Hyperlipidemia, unspecified: Secondary | ICD-10-CM | POA: Diagnosis present

## 2016-12-08 DIAGNOSIS — D638 Anemia in other chronic diseases classified elsewhere: Secondary | ICD-10-CM | POA: Diagnosis not present

## 2016-12-08 DIAGNOSIS — I639 Cerebral infarction, unspecified: Secondary | ICD-10-CM

## 2016-12-08 DIAGNOSIS — I080 Rheumatic disorders of both mitral and aortic valves: Secondary | ICD-10-CM | POA: Diagnosis present

## 2016-12-08 DIAGNOSIS — R29702 NIHSS score 2: Secondary | ICD-10-CM | POA: Diagnosis present

## 2016-12-08 DIAGNOSIS — N186 End stage renal disease: Secondary | ICD-10-CM | POA: Diagnosis present

## 2016-12-08 DIAGNOSIS — E440 Moderate protein-calorie malnutrition: Secondary | ICD-10-CM | POA: Diagnosis not present

## 2016-12-08 DIAGNOSIS — R531 Weakness: Secondary | ICD-10-CM | POA: Diagnosis not present

## 2016-12-08 DIAGNOSIS — R42 Dizziness and giddiness: Secondary | ICD-10-CM | POA: Diagnosis not present

## 2016-12-08 DIAGNOSIS — I6523 Occlusion and stenosis of bilateral carotid arteries: Secondary | ICD-10-CM | POA: Diagnosis not present

## 2016-12-08 DIAGNOSIS — Z66 Do not resuscitate: Secondary | ICD-10-CM | POA: Diagnosis present

## 2016-12-08 DIAGNOSIS — S0990XA Unspecified injury of head, initial encounter: Secondary | ICD-10-CM | POA: Diagnosis not present

## 2016-12-08 DIAGNOSIS — Z79899 Other long term (current) drug therapy: Secondary | ICD-10-CM

## 2016-12-08 DIAGNOSIS — I059 Rheumatic mitral valve disease, unspecified: Secondary | ICD-10-CM | POA: Diagnosis not present

## 2016-12-08 DIAGNOSIS — Z6823 Body mass index (BMI) 23.0-23.9, adult: Secondary | ICD-10-CM | POA: Diagnosis not present

## 2016-12-08 DIAGNOSIS — I6789 Other cerebrovascular disease: Secondary | ICD-10-CM | POA: Diagnosis not present

## 2016-12-08 DIAGNOSIS — Z8249 Family history of ischemic heart disease and other diseases of the circulatory system: Secondary | ICD-10-CM

## 2016-12-08 DIAGNOSIS — I4891 Unspecified atrial fibrillation: Secondary | ICD-10-CM | POA: Diagnosis not present

## 2016-12-08 DIAGNOSIS — I120 Hypertensive chronic kidney disease with stage 5 chronic kidney disease or end stage renal disease: Secondary | ICD-10-CM | POA: Diagnosis not present

## 2016-12-08 DIAGNOSIS — I10 Essential (primary) hypertension: Secondary | ICD-10-CM | POA: Diagnosis not present

## 2016-12-08 DIAGNOSIS — R51 Headache: Secondary | ICD-10-CM | POA: Diagnosis not present

## 2016-12-08 DIAGNOSIS — R000 Tachycardia, unspecified: Secondary | ICD-10-CM | POA: Diagnosis not present

## 2016-12-08 DIAGNOSIS — D631 Anemia in chronic kidney disease: Secondary | ICD-10-CM | POA: Diagnosis not present

## 2016-12-08 DIAGNOSIS — E872 Acidosis: Secondary | ICD-10-CM | POA: Diagnosis not present

## 2016-12-08 DIAGNOSIS — Z8673 Personal history of transient ischemic attack (TIA), and cerebral infarction without residual deficits: Secondary | ICD-10-CM | POA: Diagnosis not present

## 2016-12-08 DIAGNOSIS — H8110 Benign paroxysmal vertigo, unspecified ear: Secondary | ICD-10-CM | POA: Diagnosis not present

## 2016-12-08 DIAGNOSIS — K5730 Diverticulosis of large intestine without perforation or abscess without bleeding: Secondary | ICD-10-CM | POA: Diagnosis present

## 2016-12-08 DIAGNOSIS — I350 Nonrheumatic aortic (valve) stenosis: Secondary | ICD-10-CM

## 2016-12-08 DIAGNOSIS — N185 Chronic kidney disease, stage 5: Secondary | ICD-10-CM | POA: Diagnosis not present

## 2016-12-08 DIAGNOSIS — I679 Cerebrovascular disease, unspecified: Secondary | ICD-10-CM | POA: Diagnosis not present

## 2016-12-08 DIAGNOSIS — Z90710 Acquired absence of both cervix and uterus: Secondary | ICD-10-CM

## 2016-12-08 DIAGNOSIS — I6340 Cerebral infarction due to embolism of unspecified cerebral artery: Principal | ICD-10-CM | POA: Diagnosis present

## 2016-12-08 DIAGNOSIS — Z7982 Long term (current) use of aspirin: Secondary | ICD-10-CM | POA: Diagnosis not present

## 2016-12-08 DIAGNOSIS — I3481 Nonrheumatic mitral (valve) annulus calcification: Secondary | ICD-10-CM

## 2016-12-08 DIAGNOSIS — I348 Other nonrheumatic mitral valve disorders: Secondary | ICD-10-CM | POA: Diagnosis not present

## 2016-12-08 DIAGNOSIS — Z885 Allergy status to narcotic agent status: Secondary | ICD-10-CM | POA: Diagnosis not present

## 2016-12-08 LAB — BASIC METABOLIC PANEL
ANION GAP: 15 (ref 5–15)
BUN: 109 mg/dL — AB (ref 6–20)
CHLORIDE: 112 mmol/L — AB (ref 101–111)
CO2: 12 mmol/L — ABNORMAL LOW (ref 22–32)
Calcium: 8.5 mg/dL — ABNORMAL LOW (ref 8.9–10.3)
Creatinine, Ser: 10.66 mg/dL — ABNORMAL HIGH (ref 0.44–1.00)
GFR calc Af Amer: 4 mL/min — ABNORMAL LOW (ref >60)
GFR, EST NON AFRICAN AMERICAN: 3 mL/min — AB (ref >60)
Glucose, Bld: 104 mg/dL — ABNORMAL HIGH (ref 65–99)
POTASSIUM: 3.9 mmol/L (ref 3.5–5.1)
SODIUM: 139 mmol/L (ref 135–145)

## 2016-12-08 LAB — I-STAT TROPONIN, ED: Troponin i, poc: 0 ng/mL (ref 0.00–0.08)

## 2016-12-08 LAB — CBC
HCT: 32.3 % — ABNORMAL LOW (ref 36.0–46.0)
HEMOGLOBIN: 10 g/dL — AB (ref 12.0–15.0)
MCH: 27 pg (ref 26.0–34.0)
MCHC: 31 g/dL (ref 30.0–36.0)
MCV: 87.1 fL (ref 78.0–100.0)
Platelets: 180 10*3/uL (ref 150–400)
RBC: 3.71 MIL/uL — AB (ref 3.87–5.11)
RDW: 16.7 % — AB (ref 11.5–15.5)
WBC: 7.3 10*3/uL (ref 4.0–10.5)

## 2016-12-08 LAB — TROPONIN I: Troponin I: <0.03 ng/mL (ref <0.03)

## 2016-12-08 MED ORDER — ATORVASTATIN CALCIUM 40 MG PO TABS
40.0000 mg | ORAL_TABLET | Freq: Every day | ORAL | Status: DC
  Administered 2016-12-09: 40 mg via ORAL
  Filled 2016-12-08: qty 1

## 2016-12-08 MED ORDER — HEPARIN SODIUM (PORCINE) 5000 UNIT/ML IJ SOLN
5000.0000 [IU] | Freq: Three times a day (TID) | INTRAMUSCULAR | Status: DC
  Administered 2016-12-08 – 2016-12-10 (×6): 5000 [IU] via SUBCUTANEOUS
  Filled 2016-12-08 (×5): qty 1

## 2016-12-08 MED ORDER — CINACALCET HCL 30 MG PO TABS
60.0000 mg | ORAL_TABLET | Freq: Every day | ORAL | Status: DC
  Administered 2016-12-09 – 2016-12-10 (×2): 60 mg via ORAL
  Filled 2016-12-08 (×2): qty 2

## 2016-12-08 MED ORDER — SODIUM CHLORIDE 0.9% FLUSH
3.0000 mL | Freq: Two times a day (BID) | INTRAVENOUS | Status: DC
  Administered 2016-12-08 – 2016-12-10 (×4): 3 mL via INTRAVENOUS

## 2016-12-08 MED ORDER — AMLODIPINE BESYLATE 10 MG PO TABS
10.0000 mg | ORAL_TABLET | Freq: Every day | ORAL | Status: DC
  Filled 2016-12-08: qty 1

## 2016-12-08 MED ORDER — SEVELAMER CARBONATE 800 MG PO TABS
800.0000 mg | ORAL_TABLET | Freq: Three times a day (TID) | ORAL | Status: DC
  Administered 2016-12-09 – 2016-12-10 (×5): 800 mg via ORAL
  Filled 2016-12-08 (×5): qty 1

## 2016-12-08 MED ORDER — ASPIRIN 300 MG RE SUPP: 300.0000 mg | Freq: Every day | RECTAL | Status: DC

## 2016-12-08 MED ORDER — ASPIRIN 325 MG PO TABS
325.0000 mg | ORAL_TABLET | Freq: Every day | ORAL | Status: DC
  Filled 2016-12-08: qty 1

## 2016-12-08 MED ORDER — MECLIZINE HCL 25 MG PO TABS: 12.5000 mg | ORAL_TABLET | Freq: Two times a day (BID) | ORAL | Status: DC | PRN

## 2016-12-08 MED ORDER — DICLOFENAC SODIUM 1 % TD GEL
2.0000 g | Freq: Four times a day (QID) | TRANSDERMAL | Status: DC
  Administered 2016-12-09 – 2016-12-10 (×6): 2 g via TOPICAL
  Filled 2016-12-08: qty 100

## 2016-12-08 MED ORDER — NITROGLYCERIN 0.4 MG SL SUBL
0.4000 mg | SUBLINGUAL_TABLET | SUBLINGUAL | Status: DC | PRN
  Administered 2016-12-08 – 2016-12-09 (×4): 0.4 mg via SUBLINGUAL
  Filled 2016-12-08 (×4): qty 1

## 2016-12-08 MED ORDER — ACETAMINOPHEN 500 MG PO TABS
1000.0000 mg | ORAL_TABLET | Freq: Three times a day (TID) | ORAL | Status: DC | PRN
  Administered 2016-12-08 – 2016-12-09 (×2): 1000 mg via ORAL
  Filled 2016-12-08 (×2): qty 2

## 2016-12-08 MED ORDER — DOCUSATE SODIUM 100 MG PO CAPS
100.0000 mg | ORAL_CAPSULE | Freq: Two times a day (BID) | ORAL | Status: DC
  Administered 2016-12-09 – 2016-12-10 (×2): 100 mg via ORAL
  Filled 2016-12-08 (×4): qty 1

## 2016-12-08 MED ORDER — CLONIDINE HCL 0.1 MG/24HR TD PTWK
0.1000 mg | MEDICATED_PATCH | TRANSDERMAL | Status: DC
  Filled 2016-12-08: qty 1

## 2016-12-08 MED ORDER — SODIUM BICARBONATE 650 MG PO TABS
650.0000 mg | ORAL_TABLET | Freq: Two times a day (BID) | ORAL | Status: DC
  Administered 2016-12-08 – 2016-12-10 (×4): 650 mg via ORAL
  Filled 2016-12-08 (×4): qty 1

## 2016-12-08 MED ORDER — STROKE: EARLY STAGES OF RECOVERY BOOK
Freq: Once | Status: DC
  Filled 2016-12-08: qty 1

## 2016-12-08 MED ORDER — FAMOTIDINE 20 MG PO TABS
20.0000 mg | ORAL_TABLET | ORAL | Status: DC
  Administered 2016-12-08: 20 mg via ORAL
  Filled 2016-12-08 (×3): qty 1

## 2016-12-08 MED ORDER — NEPRO/CARBSTEADY PO LIQD: 237.0000 mL | Freq: Two times a day (BID) | ORAL | Status: DC

## 2016-12-08 MED ORDER — CARVEDILOL 25 MG PO TABS
25.0000 mg | ORAL_TABLET | Freq: Two times a day (BID) | ORAL | Status: DC
  Filled 2016-12-08: qty 1

## 2016-12-08 MED ORDER — TRAMADOL HCL 50 MG PO TABS
50.0000 mg | ORAL_TABLET | Freq: Two times a day (BID) | ORAL | Status: DC | PRN
  Administered 2016-12-09 – 2016-12-10 (×2): 50 mg via ORAL
  Filled 2016-12-08 (×2): qty 1

## 2016-12-08 MED ORDER — CALCITRIOL 0.25 MCG PO CAPS
0.2500 ug | ORAL_CAPSULE | Freq: Every day | ORAL | Status: DC
  Administered 2016-12-09 – 2016-12-10 (×2): 0.25 ug via ORAL
  Filled 2016-12-08 (×3): qty 1

## 2016-12-08 MED ORDER — ASPIRIN EC 81 MG PO TBEC
81.0000 mg | DELAYED_RELEASE_TABLET | Freq: Every day | ORAL | Status: DC
  Administered 2016-12-09 – 2016-12-10 (×2): 81 mg via ORAL
  Filled 2016-12-08 (×2): qty 1

## 2016-12-08 MED ORDER — SODIUM CHLORIDE 0.9 % IV BOLUS (SEPSIS)
500.0000 mL | Freq: Once | INTRAVENOUS | Status: AC
  Administered 2016-12-08: 500 mL via INTRAVENOUS

## 2016-12-08 NOTE — Progress Notes (Addendum)
Paged by nurse for patient having chest pain after MRI. Will obtain EKG, troponin and give nitroglycerin. Went to evaluate patient and she was resting in no acute distress. no radiating pain, no diaphoresis, pain was described as sharp and located to the center of chest and ongoing for the past 30 minutes.

## 2016-12-08 NOTE — ED Notes (Signed)
Report called to [facility]

## 2016-12-08 NOTE — H&P (Signed)
Date: 12/08/2016               Patient Name:  Nancy [name] MRN: [id number]  DOB: 12/03/1940 Age / Sex: 76 y.o., female   PCP: Sid Falcon, MD         Medical Service: Internal Medicine Teaching Service         Attending Physician: Dr. Evette Doffing, Mallie Mussel,     First Contact: Dr. Heber Wood-Ridge  Pager: [phone]  Second Contact: Dr. Posey Pronto  Pager: [phone]       After Hours (After 5p/  First Contact Pager: [phone]  weekends / holidays): Second Contact Pager: [phone]   Chief Complaint: Dizziness    History of Present Illness: Nancy B Hodgens is a 76 y.o. female with history of CKD5, HTN, AS who presents with Dizziness that started this morning when she was standing and getting ready in the bathroom to go to church. She states that she started feeling that the room was spinning and walked over to her bed and sat down for about 20 minutes.  She states the episode of dizziness lasts for a few seconds and associated with changes in position.  Patient states that she was transferred by EMS after talking with a nurse on the phone.  Patient denies tinnitis, nausea, vomiting, numbness/tingling, changes in speech or difficulty swallowing.  She reports chronic weakness in her right leg.      Meds:  Current Meds  Medication Sig  . cloNIDine (CATAPRES - DOSED IN MG/24 HR) 0.1 mg/24hr patch Place 1 patch (0.1 mg total) onto the skin every 7 (seven) days.  . diclofenac sodium (VOLTAREN) 1 % GEL Apply 2 g topically 4 (four) times daily as needed.     Allergies: Allergies as of 12/08/2016 - Review Complete 12/08/2016  Allergen Reaction Noted  . Hydrocodone-acetaminophen Nausea And Vomiting and Other (See Comments)    Past Medical History:  Diagnosis Date  . Aortic stenosis   . Bacterial sinusitis 09/17/2011  . CKD (chronic kidney disease) stage 4, GFR 15-29 ml/min ([place]) 08/11/2006   Cr continues to increase. Proteinuria on UA 02/10/12.    . Colitis   . CVA (cerebrovascular accident) [facility])    New hemorrhagic per CT scan '09  . Diverticulosis of colon   . Dysfunctional uterine bleeding   . Fecal impaction ([place])   . Headache(784.0)   . HERNIORRHAPHY, HX OF 08/11/2006  . Hypertension   . OA (osteoarthritis)    bilateral knees  . Postmenopausal   . Pulmonary nodule   . TINEA CRURIS 01/12/2007    Family History:  Mother with HTN and CHF  Social History:  Lives with her husband Tobacco: Never smoker Alcohol:  Denies  Illicit drug use: denies  Former tobacco farmer, Doctor, hospital, and housekeeper  Review of Systems: A complete ROS was negative except as per HPI.  Physical Exam: Blood pressure (!) 175/79, pulse (!) 106, temperature 97.7 F (36.5 C), temperature source Oral, resp. rate 17, SpO2 100 %. Vitals:   12/08/16 1230 12/08/16 1330 12/08/16 1415 12/08/16 1445  BP: (!) 174/92 (!) 166/74 (!) 169/84 (!) 175/79  Pulse: (!) 103 (!) 103 (!) 111 (!) 106  Resp: 20 19 16 17   Temp:      TempSrc:      SpO2: 100% 100% 100% 100%   General: Vital signs reviewed.  Patient is well-developed and well-nourished, in no acute distress and cooperative with exam.  Head: Normocephalic and atraumatic. Eyes: Patient  does not track well, conjunctivae normal, no scleral icterus.  Neck: Supple, trachea midline, normal ROM, no JVD, or carotid bruit present.  Cardiovascular: RRR, S1 normal, S2 normal, no murmurs, gallops, or rubs. Pulmonary/Chest: Clear to auscultation bilaterally, no wheezes, rales, or rhonchi. Abdominal: Soft, non-tender, non-distended, BS +, no masses, organomegaly, or guarding present.  Musculoskeletal: No joint deformities, erythema, or stiffness, ROM full and nontender. Extremities: No lower extremity edema bilaterally,  pulses symmetric and intact bilaterally. No cyanosis or clubbing. Neurological: A&O x3, Strength is normal and symmetric bilaterally, cranial nerve II-XII are grossly intact, no focal motor deficit, sensory intact to light touch bilaterally.  Positive epley maneuver Skin: Warm, dry and intact. No rashes or erythema. Psychiatric: Normal mood and affect. speech and behavior is normal. Cognition and memory are normal.   EKG: Sinus tachycardia with possible LVH  CBC    Component Value Date/Time   WBC 7.3 12/08/2016 0845   RBC 3.71 (L) 12/08/2016 0845   HGB 10.0 (L) 12/08/2016 0845   HCT 32.3 (L) 12/08/2016 0845   PLT 180 12/08/2016 0845   MCV 87.1 12/08/2016 0845   MCH 27.0 12/08/2016 0845   MCHC 31.0 12/08/2016 0845   RDW 16.7 (H) 12/08/2016 0845   LYMPHSABS 0.9 09/21/2016 0830   MONOABS 0.5 09/21/2016 0830   EOSABS 0.0 09/21/2016 0830   BASOSABS 0.0 09/21/2016 0830   BMET    Component Value Date/Time   NA 139 12/08/2016 0845   K 3.9 12/08/2016 0845   CL 112 (H) 12/08/2016 0845   CO2 12 (L) 12/08/2016 0845   GLUCOSE 104 (H) 12/08/2016 0845   BUN 109 (H) 12/08/2016 0845   CREATININE 10.66 (H) 12/08/2016 0845   CREATININE 5.79 (H) 11/25/2014 1011   CALCIUM 8.5 (L) 12/08/2016 0845   GFRNONAA 3 (L) 12/08/2016 0845   GFRNONAA 9 (L) 02/09/2014 0909   GFRAA 4 (L) 12/08/2016 0845   GFRAA 10 (L) 02/09/2014 0909     Assessment & Plan by Problem: Active Problems:   Dizziness  Dizziness Patient presents with dizziness that lasts for a few seconds and feels like the room is spinning.  She denies tinnitus, ear pain or hearing loss.  Patient has a history of CVA.  Patient had a CT head in the ED with no acute findings although notes an old left cerebellar infarction.  She states she has right sided lower extremity weakness but that was present before today.  Will obtain an MRI to assess for posterior stroke.  Patient likely has BPPV.   -MRI head - meclizine - orthostatics    CKD5/ESRD Her renal function has continued to deteriorate in the the last 1-2 years, now with GFR ~ 4.  She is not interested in HD and expressed understanding that her CKD will limit her life to months.  Palliative care was consulted on previous  admission in march 2018, and she is not interested in hospice service at that time.  She elected to be DNR. -continue home renvela -continue home sodium bicarb  HTN Hypertensive to 160s/80s on admission, She takes amlodipine 10mg , carvedilol 25mg  BID and clonidine patch 0.1mg  at home.  Patient states that she has not been taking her blood pressure medications for the past couple of days as she ran out and they have not been delivered to her yet.  -holding home blood pressure medications, can restart after MRI results  Aortic Stenosis Loud systolic murmur at RUSB and eco from 2014 which showed mild AS.  Patient does  not have dyspnea on exertion and dizziness occurs when patient is changing positions.  Do not think this is progression of aortic stenosis.  - monitor      Anemia of chronic disease Hgb is stable at 10 -continue to monitor, CBC in morning   Non anion gap metabolic acidosis Takes sodium bicarbonate 650mg  twice a day - Continue sodium bicarb  CAD Coronary artery calcifications noted on CT abdomen/pelvis in 2015.  Patient has home nitroglycerin. - Nitroglycerin PRN  Dispo: Admit patient to Observation with expected length of stay less than 2 midnights.  Signed: Valinda Party, DO 12/08/2016, 3:36 PM  Pager: [phone]

## 2016-12-08 NOTE — ED Notes (Signed)
Family at bedside. 

## 2016-12-08 NOTE — ED Notes (Signed)
Pt transported to CT ?

## 2016-12-08 NOTE — ED Notes (Signed)
Pt ambulated to restroom with stand by assist. Pt tolerate well with steady gait.

## 2016-12-08 NOTE — ED Triage Notes (Addendum)
Pt reports that yesterday morning she started having right sided weakness while walking. Pt denies any numbness or tingling. Pt states this am while getting ready for church her right side still remains weak feeling. Pt has equal grip strength, no drift, no dizziness. Pt also has chest pain last night that has resided at this time. Out of bp meds for 1 week.

## 2016-12-08 NOTE — Progress Notes (Signed)
Pt came back from MRI, this nurse was called into the room from [place] NT and stated the pt was c/o chest pain. This nurse asked Daleen Snook NT to get the ekg machine, this nurse called the dr and an order for an ekg and nitro were given. Dr Heber Prosperity came to the room and spoke with the pt. At 19:17 pts bp was 176/83 HR 105 and the first nitro tab was given SL, pt states chest pain is better but not completely gone. Pts BP 5 minutes later was 146/82 HR 109. Pt stated she is chest pain free after 1 tab

## 2016-12-09 ENCOUNTER — Observation Stay (HOSPITAL_COMMUNITY): Payer: Medicare Other

## 2016-12-09 ENCOUNTER — Encounter (HOSPITAL_COMMUNITY): Payer: Medicare Other

## 2016-12-09 DIAGNOSIS — I639 Cerebral infarction, unspecified: Secondary | ICD-10-CM

## 2016-12-09 DIAGNOSIS — Z79899 Other long term (current) drug therapy: Secondary | ICD-10-CM | POA: Diagnosis not present

## 2016-12-09 DIAGNOSIS — Z8249 Family history of ischemic heart disease and other diseases of the circulatory system: Secondary | ICD-10-CM | POA: Diagnosis not present

## 2016-12-09 DIAGNOSIS — G450 Vertebro-basilar artery syndrome: Secondary | ICD-10-CM | POA: Diagnosis not present

## 2016-12-09 DIAGNOSIS — E872 Acidosis: Secondary | ICD-10-CM

## 2016-12-09 DIAGNOSIS — I679 Cerebrovascular disease, unspecified: Secondary | ICD-10-CM | POA: Diagnosis not present

## 2016-12-09 DIAGNOSIS — Z66 Do not resuscitate: Secondary | ICD-10-CM | POA: Diagnosis present

## 2016-12-09 DIAGNOSIS — K5730 Diverticulosis of large intestine without perforation or abscess without bleeding: Secondary | ICD-10-CM | POA: Diagnosis present

## 2016-12-09 DIAGNOSIS — N185 Chronic kidney disease, stage 5: Secondary | ICD-10-CM | POA: Diagnosis not present

## 2016-12-09 DIAGNOSIS — N186 End stage renal disease: Secondary | ICD-10-CM | POA: Diagnosis not present

## 2016-12-09 DIAGNOSIS — H8110 Benign paroxysmal vertigo, unspecified ear: Secondary | ICD-10-CM | POA: Diagnosis not present

## 2016-12-09 DIAGNOSIS — E440 Moderate protein-calorie malnutrition: Secondary | ICD-10-CM | POA: Diagnosis not present

## 2016-12-09 DIAGNOSIS — I10 Essential (primary) hypertension: Secondary | ICD-10-CM | POA: Diagnosis not present

## 2016-12-09 DIAGNOSIS — Z7982 Long term (current) use of aspirin: Secondary | ICD-10-CM | POA: Diagnosis not present

## 2016-12-09 DIAGNOSIS — I080 Rheumatic disorders of both mitral and aortic valves: Secondary | ICD-10-CM | POA: Diagnosis present

## 2016-12-09 DIAGNOSIS — E785 Hyperlipidemia, unspecified: Secondary | ICD-10-CM | POA: Diagnosis present

## 2016-12-09 DIAGNOSIS — Z8673 Personal history of transient ischemic attack (TIA), and cerebral infarction without residual deficits: Secondary | ICD-10-CM

## 2016-12-09 DIAGNOSIS — D638 Anemia in other chronic diseases classified elsewhere: Secondary | ICD-10-CM | POA: Diagnosis present

## 2016-12-09 DIAGNOSIS — Z6823 Body mass index (BMI) 23.0-23.9, adult: Secondary | ICD-10-CM | POA: Diagnosis not present

## 2016-12-09 DIAGNOSIS — Z885 Allergy status to narcotic agent status: Secondary | ICD-10-CM | POA: Diagnosis not present

## 2016-12-09 DIAGNOSIS — R42 Dizziness and giddiness: Secondary | ICD-10-CM | POA: Diagnosis not present

## 2016-12-09 DIAGNOSIS — I120 Hypertensive chronic kidney disease with stage 5 chronic kidney disease or end stage renal disease: Secondary | ICD-10-CM | POA: Diagnosis not present

## 2016-12-09 DIAGNOSIS — Z87891 Personal history of nicotine dependence: Secondary | ICD-10-CM | POA: Diagnosis not present

## 2016-12-09 DIAGNOSIS — I2510 Atherosclerotic heart disease of native coronary artery without angina pectoris: Secondary | ICD-10-CM | POA: Diagnosis present

## 2016-12-09 DIAGNOSIS — M170 Bilateral primary osteoarthritis of knee: Secondary | ICD-10-CM | POA: Diagnosis present

## 2016-12-09 DIAGNOSIS — I6523 Occlusion and stenosis of bilateral carotid arteries: Secondary | ICD-10-CM | POA: Diagnosis present

## 2016-12-09 DIAGNOSIS — I6340 Cerebral infarction due to embolism of unspecified cerebral artery: Secondary | ICD-10-CM | POA: Diagnosis not present

## 2016-12-09 DIAGNOSIS — I4891 Unspecified atrial fibrillation: Secondary | ICD-10-CM | POA: Diagnosis present

## 2016-12-09 DIAGNOSIS — I350 Nonrheumatic aortic (valve) stenosis: Secondary | ICD-10-CM | POA: Diagnosis not present

## 2016-12-09 DIAGNOSIS — Z90710 Acquired absence of both cervix and uterus: Secondary | ICD-10-CM | POA: Diagnosis not present

## 2016-12-09 LAB — CBC
HEMATOCRIT: 29 % — AB (ref 36.0–46.0)
HEMOGLOBIN: 8.9 g/dL — AB (ref 12.0–15.0)
MCH: 26.4 pg (ref 26.0–34.0)
MCHC: 30.7 g/dL (ref 30.0–36.0)
MCV: 86.1 fL (ref 78.0–100.0)
PLATELETS: 177 10*3/uL (ref 150–400)
RBC: 3.37 MIL/uL — ABNORMAL LOW (ref 3.87–5.11)
RDW: 16.5 % — ABNORMAL HIGH (ref 11.5–15.5)
WBC: 6.7 10*3/uL (ref 4.0–10.5)

## 2016-12-09 LAB — ECHOCARDIOGRAM COMPLETE
HEIGHTINCHES: 63 in
Weight: 2139.2 oz

## 2016-12-09 LAB — LIPID PANEL
CHOL/HDL RATIO: 2.9 ratio
CHOLESTEROL: 109 mg/dL (ref 0–200)
HDL: 37 mg/dL — ABNORMAL LOW (ref >40)
LDL Cholesterol: 50 mg/dL (ref 0–99)
Triglycerides: 110 mg/dL (ref <150)
VLDL: 22 mg/dL (ref 0–40)

## 2016-12-09 LAB — BASIC METABOLIC PANEL
ANION GAP: 13 (ref 5–15)
BUN: 104 mg/dL — ABNORMAL HIGH (ref 6–20)
CHLORIDE: 112 mmol/L — AB (ref 101–111)
CO2: 12 mmol/L — ABNORMAL LOW (ref 22–32)
CREATININE: 10.33 mg/dL — AB (ref 0.44–1.00)
Calcium: 8.1 mg/dL — ABNORMAL LOW (ref 8.9–10.3)
GFR calc non Af Amer: 3 mL/min — ABNORMAL LOW (ref >60)
GFR, EST AFRICAN AMERICAN: 4 mL/min — AB (ref >60)
Glucose, Bld: 89 mg/dL (ref 65–99)
POTASSIUM: 3.6 mmol/L (ref 3.5–5.1)
SODIUM: 137 mmol/L (ref 135–145)

## 2016-12-09 LAB — TROPONIN I: Troponin I: <0.03 ng/mL (ref <0.03)

## 2016-12-09 LAB — GLUCOSE, CAPILLARY: GLUCOSE-CAPILLARY: 99 mg/dL (ref 65–99)

## 2016-12-09 MED ORDER — SODIUM CHLORIDE 0.9 % IV SOLN
INTRAVENOUS | Status: AC
  Administered 2016-12-09: 07:00:00 via INTRAVENOUS

## 2016-12-09 MED ORDER — ISOSORBIDE MONONITRATE ER 30 MG PO TB24: 30.0000 mg | ORAL_TABLET | Freq: Every day | ORAL | 0 refills | Status: DC

## 2016-12-09 MED ORDER — SODIUM CHLORIDE 0.9 % IV BOLUS (SEPSIS)
500.0000 mL | Freq: Once | INTRAVENOUS | Status: AC
  Administered 2016-12-09: 500 mL via INTRAVENOUS

## 2016-12-09 MED ORDER — ISOSORBIDE MONONITRATE ER 30 MG PO TB24
15.0000 mg | ORAL_TABLET | Freq: Every day | ORAL | Status: DC
  Administered 2016-12-09: 15 mg via ORAL
  Filled 2016-12-09: qty 1

## 2016-12-09 MED ORDER — BOOST / RESOURCE BREEZE PO LIQD
1.0000 | Freq: Three times a day (TID) | ORAL | Status: DC
  Administered 2016-12-09: 1 via ORAL

## 2016-12-09 MED ORDER — ATORVASTATIN CALCIUM 40 MG PO TABS: 40.0000 mg | ORAL_TABLET | Freq: Every day | ORAL | 1 refills | Status: DC

## 2016-12-09 MED ORDER — ASPIRIN 81 MG PO TBEC: 81.0000 mg | DELAYED_RELEASE_TABLET | Freq: Every day | ORAL | 1 refills | Status: DC

## 2016-12-09 NOTE — Progress Notes (Addendum)
Patient began complaining of 10/10 chest pain. BP was 171/84 with HR 104. First nitro given at 0630. 5 minutes after the first nitro, patient stated her chest pain was still a 10. Second nitro given at [phone]. After first and second nitro there was no significant drop in chest pain per patient.  BP dropped to 135/82. Third nitro given at 0640. After the third nitro patient's BP became 105/63 and continued to drop into the 85'T systolically. EKG completed and results were read to [name],MD who was notified at 0641 and again at [phone] when the patient's BP dropped. At 0659, BP manually was 140/72. MD discontinued PRN nitro for the patient even though patient stated that she does experience chest pain at home and takes nitro to relieve the chest pain. MD stated he would order for more of patient's troponin levels would be drawn and he also ordered a 568mL bolus followed by continuous IV fluids. Repeat BP at 0710 was 139/75 with HR 107 and oxygen saturation level of 98% on room air. Patient resting comfortably in bed resting now. Will pass on this info to oncoming dayshift RN.

## 2016-12-09 NOTE — Progress Notes (Signed)
   Subjective: Patient reports dizziness when taken for her ECHO and carotid dopplers.  Patient denies any chest pain   Objective:  Vital signs in last 24 hours: Vitals:   12/09/16 0644 12/09/16 0659 12/09/16 0706 12/09/16 0728  BP: 105/63 140/72 140/72 (!) 142/72  Pulse: (!) 110   (!) 108  Resp:      Temp:      TempSrc:      SpO2:      Weight:      Height:       Physical Exam  Constitutional: She is well-developed, well-nourished, and in no distress.  Cardiovascular: Normal rate.   3/6 early systolic murmur at the right upper sternal border  Pulmonary/Chest: Effort normal and breath sounds normal. No respiratory distress. She has no wheezes. She has no rales.  Abdominal: Soft. She exhibits no distension. There is no tenderness.  Musculoskeletal: She exhibits no edema.  Skin: Skin is warm and dry.     Assessment/Plan:  Active Problems:   Dizziness  CVA Patient presented with dizziness yesterday and an MRI was obtained to rule out posterior stroke. No signs of posterior stroke on MRI however 3 subcentimeter acute infarcts were noted in the bite lateral temporal lobes. Plan is to obtain MRA brain, carotid Dopplers, echo and continue with telemetry. Hemoglobin A1C 5.5. Patient was started on aspirin and will be continued on aspirin 81 mg daily plus a statin. Consult was made to the stroke team. -MRA brain -carotid dopplers - echo - telemetry  - aspirin 81mg  - atorvastatin 40mg    Dizziness Likely BPPV given the timing of the symptoms.    -Epley maneuver     CKD5/ESRD Patient does not want to pursue dialysis and has also elected to be DNR. -continue home renvela -continue home sodium bicarb  HTN Hypertensive, with acute CVA will hold home bp meds.  Discharge with IMDUR for better BP control and help with anginal symptoms. -holding home blood pressure medications  Aortic Stenosis Loud systolic murmur at RUSB and eco from 2014 which showed mild AS.  Patient will  have echo during this admission as part of the stroke workup.  Can further assess changes in aortic stenosis. - Echo     Anemia of chronic disease Hgb is stable at 8.9   Non anion gap metabolic acidosis Takes sodium bicarbonate 650mg  twice a day - Continue sodium bicarb  CAD Coronary artery calcifications noted on CT abdomen/pelvis in 2015.  Patient has home nitroglycerin.  Patient has complained of chest pain during admission. Troponins have been negative 3 and no EKG changes have been noted. - statin   Dispo: Anticipated discharge pending imaging.  Patient has followup with primary care on June 13th at [facility], [place], DO 12/09/2016, 2:10 PM Pager: [phone]

## 2016-12-09 NOTE — Evaluation (Signed)
Physical Therapy Evaluation Patient Details Name: Nancy [name] MRN: [id number] DOB: 11/17/1940 Today's Date: 12/09/2016   History of Present Illness  Nancy B Mandato is a 76 y.o. female with history of CKD5, HTN, AS who presents with Dizziness that started this morning when she was standing and getting ready in the bathroom to go to church.   Clinical Impression  Patient presents with decreased independence with mobility due to deficits listed in PT problem list.  She is limited by dizziness, R knee pain and weakness and will benefit from skilled PT in the acute setting to allow return home with family support and follow up HHPT.     Follow Up Recommendations Home health PT;Supervision/Assistance - 24 hour    Equipment Recommendations  None recommended by PT    Recommendations for Other Services       Precautions / Restrictions Precautions Precautions: Fall      Mobility  Bed Mobility Overal bed mobility: Modified Independent                Transfers Overall transfer level: Needs assistance   Transfers: Sit to/from Stand Sit to Stand: Min guard         General transfer comment: for safety  Ambulation/Gait Ambulation/Gait assistance: Supervision;Min guard Ambulation Distance (Feet): 135 Feet Assistive device: None Gait Pattern/deviations: Step-through pattern;Decreased stride length;Antalgic     General Gait Details: slow pace, mildly antalgic, but no device, some dizziness turning around, but improved with cues for visual target.  Stairs            Wheelchair Mobility    Modified Rankin (Stroke Patients Only) Modified Rankin (Stroke Patients Only) Pre-Morbid Rankin Score: Slight disability Modified Rankin: Moderately severe disability     Balance Overall balance assessment: Needs assistance   Sitting balance-Leahy Scale: Good       Standing balance-Leahy Scale: Fair                               Pertinent Vitals/Pain  Pain Assessment: 0-10 Pain Score: 2  Pain Location: R knee with ambulation Pain Descriptors / Indicators: Aching;Discomfort Pain Intervention(s): Monitored during session;Repositioned    Home Living Family/patient expects to be discharged to:: Private residence Living Arrangements: Spouse/significant other Available Help at Discharge: Family Type of Home: Apartment Home Access: Level entry (in front, 5 steps out back with rail (to take out trash))     Home Layout: One level Home Equipment: Walker - 2 wheels;Cane - single point Additional Comments: uses can intermittently; takes tub baths    Prior Function Level of Independence: Independent               Hand Dominance   Dominant Hand: Right    Extremity/Trunk Assessment   Upper Extremity Assessment Upper Extremity Assessment: Overall WFL for tasks assessed    Lower Extremity Assessment Lower Extremity Assessment: RLE deficits/detail RLE Deficits / Details: limited knee flexion with edema and pain about 40 degrees and unable to extend from flexed antigravity, but can lift with SLR from bed       Communication   Communication: No difficulties  Cognition Arousal/Alertness: Awake/alert Behavior During Therapy: WFL for tasks assessed/performed Overall Cognitive Status: Within Functional Limits for tasks assessed  General Comments      Exercises     Assessment/Plan    PT Assessment Patient needs continued PT services  PT Problem List Decreased balance;Decreased strength;Pain;Decreased knowledge of use of DME;Decreased activity tolerance;Decreased mobility       PT Treatment Interventions DME instruction;Gait training;Balance training;Functional mobility training;Therapeutic exercise;Patient/family education;Therapeutic activities;Neuromuscular re-education    PT Goals (Current goals can be found in the Care Plan section)  Acute Rehab PT Goals Patient  Stated Goal: To return home to independent PT Goal Formulation: With patient Time For Goal Achievement: 12/16/16 Potential to Achieve Goals: Good    Frequency Min 4X/week   Barriers to discharge        Co-evaluation               AM-PAC PT "6 Clicks" Daily Activity  Outcome Measure Difficulty turning over in bed (including adjusting bedclothes, sheets and blankets)?: None Difficulty moving from lying on back to sitting on the side of the bed? : Total Difficulty sitting down on and standing up from a chair with arms (e.g., wheelchair, bedside commode, etc,.)?: Total Help needed moving to and from a bed to chair (including a wheelchair)?: A Little Help needed walking in hospital room?: A Little Help needed climbing 3-5 steps with a railing? : A Little 6 Click Score: 15    End of Session Equipment Utilized During Treatment: Gait belt Activity Tolerance: Patient tolerated treatment well Patient left: in chair;with call bell/phone within reach   PT Visit Diagnosis: Other abnormalities of gait and mobility (R26.89);Other symptoms and signs involving the nervous system (R29.898)    Time: 1638-4536 PT Time Calculation (min) (ACUTE ONLY): 24 min   Charges:   PT Evaluation $PT Eval Moderate Complexity: 1 Procedure PT Treatments $Gait Training: 8-22 mins   PT G CodesMagda Kiel, [place] [phone] 12/09/2016   Reginia Naas 12/09/2016, 12:53 PM

## 2016-12-09 NOTE — Progress Notes (Signed)
  Echocardiogram 2D Echocardiogram has been performed.  AmeLie Hollars L Androw 12/09/2016, 2:37 PM

## 2016-12-09 NOTE — Progress Notes (Signed)
Internal Medicine Attending:   I saw and examined the patient. I reviewed the resident's note and I agree with the resident's findings and plan as documented in the resident's note.  Patient with multiple small bilateral CVAs seen on MRI today. She is undergoing the usual stroke workup and we have started aspirin in addition to statin for secondary prevention. Her overall prognosis is limited by advanced CKD and not an HD candidate, so our CVA prevention recommendations will be tailored to her overall goals of comfort and functional status. Stroke team will be consulted. PT, OT, and speech teams also consulted.

## 2016-12-09 NOTE — Progress Notes (Signed)
SLP Cancellation Note  Patient Details Name: Nancy [name] MRN: [id number] DOB: Nov 18, 1940   Cancelled treatment:        First attempt pt bathing; second, pt out of room. Will continue efforts tomorrow.    Houston Siren 12/09/2016, 2:26 PM

## 2016-12-09 NOTE — Progress Notes (Signed)
Patient refused to have carotid ultrasound done while she was in the vascular lab. Will attempt again on 6/5.  Lita Mains- RDMS, RVT 2:37 PM  12/09/2016

## 2016-12-09 NOTE — Progress Notes (Signed)
Initial Nutrition Assessment  DOCUMENTATION CODES:   Non-severe (moderate) malnutrition in context of chronic illness  INTERVENTION:   -D/c Nepro Shake po BID, each supplement provides 425 kcal and 19 grams protein -Boost Breeze po TID, each supplement provides 250 kcal and 9 grams of protein  NUTRITION DIAGNOSIS:   Malnutrition (mild) related to chronic illness (CKD) as evidenced by percent weight loss, energy intake < or equal to 75% for > or equal to 1 month, mild depletion of body fat, moderate depletion of body fat, mild depletion of muscle mass, moderate depletions of muscle mass.  GOAL:   Patient will meet greater than or equal to 90% of their needs  MONITOR:   PO intake, Supplement acceptance, Labs, Weight trends, Skin, I & O's  REASON FOR ASSESSMENT:   Malnutrition Screening Tool    ASSESSMENT:   Nancy [name] is a 76 y.o. female with history of CKD5, HTN, AS who presents with Dizziness   Pt admitted with dizziness.   Spoke with pt, who reports progressive decline in appetite and weight loss over the past several years. Per pt, she used to weigh over 200# and weight has slowly decreased; she denies intentionally trying to lose weight. She "only eats when I'm hungry" and now only consumes 1 meal per day (Breakfast: toast, eggs, and coffee). She denies drinking or snacking in between meals. Pt shares she only consumed 50% of grits today.   Pt has experienced a 27# (16.9%) wt loss over the past 6 months, which is significant for time frame.   Nutrition-Focused physical exam completed. Findings are mild to moderate fat depletion, mild to moderate muscle depletion, and no edema.   Discussed with pt importance of adequate nutrition to promote healing and prevent further weight loss. Pt does not like the milky-type supplements, but is willing to try Boost Breeze, due to clear liquid consistency and renal friendliness.  Labs reviewed.  Diet Order:  Diet Heart Room  service appropriate? Yes; Fluid consistency: Thin Diet - low sodium heart healthy  Skin:  Reviewed, no issues  Last BM:  12/07/16  Height:   Ht Readings from Last 1 Encounters:  12/08/16 5\' 3"  (1.6 m)    Weight:   Wt Readings from Last 1 Encounters:  12/08/16 133 lb 11.2 oz (60.6 kg)    Ideal Body Weight:  52.3 kg  BMI:  Body mass index is 23.68 kg/m.  Estimated Nutritional Needs:   Kcal:  1600-1800  Protein:  85-100 grams  Fluid:  1.6-1.8 L  EDUCATION NEEDS:   Education needs addressed  Nancy [name], RD, LDN, CDE Pager: [phone] After hours Pager: [phone]

## 2016-12-09 NOTE — Evaluation (Signed)
Occupational Therapy Evaluation Patient Details Name: Nancy [name] MRN: [id number] DOB: 11-Feb-1941 Today's Date: 12/09/2016    History of Present Illness Nancy B Byus is a 76 y.o. female with history of CKD5, HTN, AS who presents with Dizziness that started this morning when she was standing and getting ready in the bathroom to go to church.    Clinical Impression   Patient presenting with decreased I in self care, balance, functional transfers, mobility, safety, and endurance.  Patient reports being mod I  PTA. Patient currently functioning supervision - min A. Patient will benefit from acute OT to increase overall independence in the areas of ADLs, functional mobility, and safety awareness in order to safely discharge home with family.Pt having LOB when standing from toilet requiring min A to correct. OT also advising pt to not lay into tub at home for safety. OT recommended sink bathing initially at discharge as pt declines showers. Pt and family member verbalize understanding.     Follow Up Recommendations  Home health OT;Supervision/Assistance - 24 hour    Equipment Recommendations  None recommended by OT    Recommendations for Other Services       Precautions / Restrictions Precautions Precautions: Fall Restrictions Weight Bearing Restrictions: No      Mobility Bed Mobility Overal bed mobility: Modified Independent        Transfers Overall transfer level: Needs assistance   Transfers: Sit to/from Stand Sit to Stand: Min guard         General transfer comment: for safety    Balance Overall balance assessment: Needs assistance   Sitting balance-Leahy Scale: Good       Standing balance-Leahy Scale: Fair            ADL either performed or assessed with clinical judgement   ADL Overall ADL's : Needs assistance/impaired Eating/Feeding: Modified independent   Grooming: Set up;Supervision/safety;Wash/dry hands;Standing   Upper Body Bathing: Set  up;Supervision/ safety;Sitting   Lower Body Bathing: Set up;Minimal assistance;Sit to/from stand   Upper Body Dressing : Set up;Supervision/safety;Sitting   Lower Body Dressing: Minimal assistance;Sit to/from stand   Toilet Transfer: Regular Toilet;Grab bars;Minimal assistance   Toileting- Clothing Manipulation and Hygiene: Sit to/from stand;Minimal assistance               Vision Baseline Vision/History: Wears glasses Wears Glasses: At all times Patient Visual Report: No change from baseline              Pertinent Vitals/Pain Pain Assessment: 0-10 Pain Score: 2  Pain Location: B knees Pain Descriptors / Indicators: Aching;Discomfort Pain Intervention(s): Monitored during session;Repositioned     Hand Dominance Right   Extremity/Trunk Assessment Upper Extremity Assessment Upper Extremity Assessment: Overall WFL for tasks assessed   Lower Extremity Assessment Lower Extremity Assessment: Defer to PT evaluation RLE Deficits / Details: limited knee flexion with edema and pain about 40 degrees and unable to extend from flexed antigravity, but can lift with SLR from bed       Communication Communication Communication: No difficulties   Cognition Arousal/Alertness: Awake/alert Behavior During Therapy: WFL for tasks assessed/performed Overall Cognitive Status: Within Functional Limits for tasks assessed                   Home Living Family/patient expects to be discharged to:: Private residence Living Arrangements: Spouse/significant other Available Help at Discharge: Family Type of Home: Apartment Home Access: Level entry     Home Layout: One level     Bathroom Shower/Tub: Tub/shower  unit         Home Equipment: Walker - 2 wheels;Cane - single point   Additional Comments: uses can intermittently; takes tub baths      Prior Functioning/Environment Level of Independence: Independent                 OT Problem List: Decreased  strength;Decreased activity tolerance;Impaired balance (sitting and/or standing);Decreased safety awareness;Decreased knowledge of use of DME or AE      OT Treatment/Interventions: Self-care/ADL training;Therapeutic exercise;Energy conservation;DME and/or AE instruction;Therapeutic activities;Patient/family education    OT Goals(Current goals can be found in the care plan section) Acute Rehab OT Goals Patient Stated Goal: To return home to independent OT Goal Formulation: With patient/family Time For Goal Achievement: 12/23/16 Potential to Achieve Goals: Good ADL Goals Pt Will Perform Grooming: with modified independence Pt Will Perform Upper Body Bathing: with modified independence;sitting Pt Will Perform Lower Body Bathing: with supervision;sit to/from stand Pt Will Perform Upper Body Dressing: with modified independence;sitting Pt Will Perform Lower Body Dressing: sit to/from stand;with supervision Pt Will Transfer to Toilet: with supervision Pt Will Perform Toileting - Clothing Manipulation and hygiene: with supervision  OT Frequency: Min 2X/week   Barriers to D/C:    none known at this time          AM-PAC PT "6 Clicks" Daily Activity     Outcome Measure Help from another person eating meals?: None Help from another person taking care of personal grooming?: A Little Help from another person toileting, which includes using toliet, bedpan, or urinal?: A Little Help from another person bathing (including washing, rinsing, drying)?: A Little Help from another person to put on and taking off regular upper body clothing?: A Little Help from another person to put on and taking off regular lower body clothing?: A Little 6 Click Score: 19   End of Session    Activity Tolerance: Patient tolerated treatment well Patient left: in bed;with call bell/phone within reach;with bed alarm set;with family/visitor present  OT Visit Diagnosis: Muscle weakness (generalized) (M62.81)                 Time: 3354-5625 OT Time Calculation (min): 24 min Charges:  OT General Charges $OT Visit: 1 Procedure OT Evaluation $OT Eval Low Complexity: 1 Procedure OT Treatments $Self Care/Home Management : 8-22 mins G-Codes: OT G-codes **NOT FOR INPATIENT CLASS** Functional Assessment Tool Used: AM-PAC 6 Clicks Daily Activity;Clinical judgement Functional Limitation: Self care Self Care Current Status (W3893): At least 40 percent but less than 60 percent impaired, limited or restricted Self Care Goal Status (T3428): At least 1 percent but less than 20 percent impaired, limited or restricted    Gypsy Decant, MS, OTR/l, CBIS 12/09/2016, 2:52 PM

## 2016-12-09 NOTE — Consult Note (Signed)
Requesting Physician: Dr. Evette Doffing    Chief Complaint: Stroke  History obtained from:  Patient    HPI:                                                                                                                                         Nancy [name] is an 76 y.o. female with end-stage CKD5 not on HD per her preferences, came to the emergency departments because of intermittent dizziness that lasted for several minutes. Given persistent dizziness and MRI was done to rule out posterior CVA. There is no cerebellar infarctions, instead there were 3 subcentimeter acute infarcts in the bilateral temporal lobes.   In speaking to the patient she states that she felt as though she was walking on water and was off balance. She denied any vertigo and there are multiple questions that I asked about vertigo and again she denied feeling internal or external vertigo. She felt" dizzy". She also states that she felt as though she was going to pass out but when she sat down or lay down those symptoms abated. Patient denies any foot flopping, or abnormal heart rhythms. Patient does not have a history of A. fib or any heart rhythms. Patient does have aortic stenosis. MRI that was obtained does show bilateral infarcts and the echo has been obtained but has not been read at this point. Patient has refused her carotid Doppler secondary to knee pain and could not withstand. I've explained to the patient's family that it is important to have his carotid Dopplers done to finish up the stroke workup. Patient does not take an aspirin on a daily basis.  Date last known well: Date: 12/09/2016 Time last known well: Unable to determine tPA Given: No: no symptoms  Past Medical History:  Diagnosis Date  . Aortic stenosis   . Bacterial sinusitis 09/17/2011  . CKD (chronic kidney disease) stage 4, GFR 15-29 ml/min ([place]) 08/11/2006   Cr continues to increase. Proteinuria on UA 02/10/12.    . Colitis   . CVA (cerebrovascular  accident) [facility])    New hemorrhagic per CT scan '09  . Diverticulosis of colon   . Dysfunctional uterine bleeding   . Fecal impaction ([place])   . Headache(784.0)   . HERNIORRHAPHY, HX OF 08/11/2006  . Hypertension   . OA (osteoarthritis)    bilateral knees  . Postmenopausal   . Pulmonary nodule   . TINEA CRURIS 01/12/2007    Past Surgical History:  Procedure Laterality Date  . ABDOMINAL HYSTERECTOMY    . CHOLECYSTECTOMY  2009  . INGUINAL HERNIA REPAIR  2008  . IRIDOTOMY / IRIDECTOMY     Laser, right eye 12/26/11 left eye 01/24/12  . MASS EXCISION Left 05/07/2013   Procedure: EXCISION CYST;  Surgeon: Myrtha Mantis., MD;  Location: [facility];  Service: Ophthalmology;  Laterality: Left;    Family History  Problem Relation Age of Onset  . Hypertension Mother   . Heart attack Mother   . Heart disease Mother    Social History:  reports that she has never smoked. She has never used smokeless tobacco. She reports that she does not drink alcohol or use drugs.  Allergies:  Allergies  Allergen Reactions  . Hydrocodone-Acetaminophen Nausea And Vomiting and Other (See Comments)    Dizziness    Medications:                                                                                                                           Scheduled: .  stroke: mapping our early stages of recovery book   Does not apply Once  . aspirin EC  81 mg Oral Daily  . atorvastatin  40 mg Oral q1800  . calcitRIOL  0.25 mcg Oral Daily  . cinacalcet  60 mg Oral Q breakfast  . diclofenac sodium  2 g Topical QID  . docusate sodium  100 mg Oral BID  . famotidine  20 mg Oral Q48H  . feeding supplement (NEPRO CARB STEADY)  237 mL Oral BID BM  . heparin  5,000 Units Subcutaneous Q8H  . sevelamer carbonate  800 mg Oral TID WC  . sodium bicarbonate  650 mg Oral BID  . sodium chloride flush  3 mL Intravenous Q12H    ROS:                                                                                                                                        History obtained from the patient  General ROS: negative for - chills, fatigue, fever, night sweats, weight gain or weight loss Psychological ROS: negative for - behavioral disorder, hallucinations, memory difficulties, mood swings or suicidal ideation Ophthalmic ROS: negative for - blurry vision, double vision, eye pain or loss of vision ENT ROS: negative for - epistaxis, nasal discharge, oral lesions, sore throat, tinnitus or vertigo Allergy and Immunology ROS: negative for - hives or itchy/watery eyes Hematological and Lymphatic ROS: negative for - bleeding problems, bruising or swollen lymph nodes Endocrine ROS: negative for - galactorrhea, hair pattern changes, polydipsia/polyuria or temperature intolerance Respiratory ROS: negative for - cough, hemoptysis, shortness of breath or wheezing Cardiovascular ROS: negative for - chest pain, dyspnea on exertion, edema or irregular heartbeat Gastrointestinal ROS: negative for - abdominal pain,  diarrhea, hematemesis, nausea/vomiting or stool incontinence Genito-Urinary ROS: negative for - dysuria, hematuria, incontinence or urinary frequency/urgency Musculoskeletal ROS: negative for - joint swelling or muscular weakness Neurological ROS: as noted in HPI Dermatological ROS: negative for rash and skin lesion changes  Neurologic Examination:                                                                                                      Blood pressure (!) 142/72, pulse (!) 108, temperature 98 F (36.7 C), temperature source Oral, resp. rate 18, height 5\' 3"  (1.6 m), weight 60.6 kg (133 lb 11.2 oz), SpO2 100 %.  HEENT-  Normocephalic, no lesions, without obvious abnormality.  Normal external eye and conjunctiva.  Normal TM's bilaterally.  Normal auditory canals and external ears. Normal external nose, mucus membranes and septum.  Normal pharynx. Cardiovascular- S1, S2 normal, pulses  palpable throughout   Lungs- chest clear, no wheezing, rales, normal symmetric air entry Abdomen- normal findings: bowel sounds normal Extremities- no edema Lymph-no adenopathy palpable Musculoskeletal-no joint tenderness, deformity or swelling Skin-warm and dry, no hyperpigmentation, vitiligo, or suspicious lesions  Neurological Examination Mental Status: Alert, oriented, thought content appropriate.  Speech fluent without evidence of aphasia.  Able to follow 3 step commands without difficulty. Cranial Nerves: II: Patient has poor vision at baseline, however she was able to see my fingers in all 4 quadrants and bilateral eyes III,IV, VI: ptosis not present, extra-ocular motions intact bilaterally, pupils equal, round, reactive to light and accommodation V,VII: smile symmetric, facial light touch sensation normal bilaterally VIII: hearing normal bilaterally IX,X: uvula rises symmetrically XI: bilateral shoulder shrug XII: midline tongue extension Motor: Right : Upper extremity   5/5    Left:     Upper extremity   5/5  Lower extremity   5/5     Lower extremity   5/5 --Right lower extremity was slightly weaker at times secondary to pain and knee Tone and bulk:normal tone throughout; no atrophy noted Sensory: Pinprick and light touch intact throughout, bilaterally Deep Tendon Reflexes: 2+ and symmetric throughout Plantars: Right: downgoing   Left: downgoing Cerebellar: normal finger-to-nose,  and normal heel-to-shin test Gait: Not tested       Lab Results: Basic Metabolic Panel:  Recent Labs Lab 12/08/16 0845 12/09/16 0129  NA 139 137  K 3.9 3.6  CL 112* 112*  CO2 12* 12*  GLUCOSE 104* 89  BUN 109* 104*  CREATININE 10.66* 10.33*  CALCIUM 8.5* 8.1*    Liver Function Tests: No results for input(s): AST, ALT, ALKPHOS, BILITOT, PROT, ALBUMIN in the last 168 hours. No results for input(s): LIPASE, AMYLASE in the last 168 hours. No results for input(s): AMMONIA in the  last 168 hours.  CBC:  Recent Labs Lab 12/05/16 0838 12/08/16 0845 12/09/16 0129  WBC  --  7.3 6.7  HGB 10.6* 10.0* 8.9*  HCT  --  32.3* 29.0*  MCV  --  87.1 86.1  PLT  --  180 177    Cardiac Enzymes:  Recent Labs Lab 12/08/16 1925 12/09/16 0129 12/09/16 0634  TROPONINI <  0.03 <0.03 <0.03    Lipid Panel:  Recent Labs Lab 12/09/16 0129  CHOL 109  TRIG 110  HDL 37*  CHOLHDL 2.9  VLDL 22  LDLCALC 50    CBG:  Recent Labs Lab 12/09/16 0651  GLUCAP 99    Microbiology: Results for orders placed or performed during the hospital encounter of 07/26/13  Urine culture     Status: None   Collection Time: 07/26/13  3:24 PM  Result Value Ref Range Status   Specimen Description URINE, CLEAN CATCH  Final   Special Requests NONE  Final   Culture  Setup Time   Final    07/26/2013 16:50 Performed at [place]   Final    5,000 COLONIES/ML Performed at [facility]   Culture   Final    INSIGNIFICANT GROWTH Performed at [facility]   Report Status 07/28/2013 FINAL  Final    Coagulation Studies: No results for input(s): LABPROT, INR in the last 72 hours.  Imaging: Ct Head Wo Contrast  Result Date: 12/08/2016 CLINICAL DATA:  Headache. Right-sided weakness. Dizziness. Symptoms began 2 days ago. EXAM: CT HEAD WITHOUT CONTRAST TECHNIQUE: Contiguous axial images were obtained from the base of the skull through the vertex without intravenous contrast. COMPARISON:  12/25/2007 FINDINGS: Brain: Old left cerebellar infarction. Brainstem is normal. Cerebral hemispheres show small vessel change of the white matter. No sign of acute infarction, mass lesion, hemorrhage, hydrocephalus or extra-axial collection. Vascular: There is atherosclerotic calcification of the major vessels at the base of the brain. Skull: Negative Sinuses/Orbits: Clear/normal Other: None significant IMPRESSION: No acute finding by CT. Old left cerebellar infarction.  Chronic small-vessel change of the cerebral hemispheric white matter. Electronically Signed   By: Nelson Chimes M.D.   On: 12/08/2016 10:57   Mr Brain Wo Contrast  Result Date: 12/08/2016 CLINICAL DATA:  Dizziness. Symptoms started this morning fall getting ready for church. Dizziness would last for a few seconds and was associated with change in head position. EXAM: MRI HEAD WITHOUT CONTRAST TECHNIQUE: Multiplanar, multiecho pulse sequences of the brain and surrounding structures were obtained without intravenous contrast. COMPARISON:  Head CT from earlier today FINDINGS: Brain: Three 3-5 mm foci of restricted diffusion in the bilateral medial temporal lobes and in the left frontal white matter inferiorly. Remote hemorrhagic insult with volume loss in the inferior left cerebellum. Ischemic gliosis in the cerebral white matter that is mild to moderate, greatest posteriorly. Two small remote micro hemorrhages in the inferior right cerebellum. No acute hemorrhage, hydrocephalus, or mass effect. Vascular: Major flow voids are preserved. Skull and upper cervical spine: Diffusely hypointense marrow as seen with anemia or other myeloproliferative derangement. No focal marrow lesion is noted. Sinuses/Orbits: No acute finding Other: Progressively motion degraded exam. IMPRESSION: 1. Three subcentimeter acute infarcts located in the bilateral medial temporal lobes and inferior left frontal white matter. 2. Mild to moderate chronic small vessel ischemia in the cerebral white matter. 3. Remote left inferior cerebellar hemorrhage. 4. Diffuse abnormal marrow signal as seen with anemia or generalized lymphoproliferative disease. Correlate with CBC. Electronically Signed   By: Monte Fantasia M.D.   On: 12/08/2016 18:20   Mr Jodene Nam Headm  Result Date: 12/09/2016 CLINICAL DATA:  76 year old female with several acute lacunar infarcts suspected on brain MRI yesterday, involving the medial temporal lobes and inferior left frontal  white matter. Dizziness. EXAM: MRA HEAD WITHOUT CONTRAST TECHNIQUE: Angiographic images of the Circle of Willis were obtained using  MRA technique without intravenous contrast. COMPARISON:  Brain MRI 12/08/2016 and earlier. FINDINGS: No intracranial mass effect or ventriculomegaly. Antegrade flow in the distal right vertebral artery which appears dominant and dolichoectatic. No distal left vertebral artery flow signal. No right vertebral or basilar artery stenosis. Mild basilar artery dolichoectasia. Bilateral AICA, SCA and PCA origins are normal. Posterior communicating arteries are diminutive or absent. Normal bilateral PCA branches. Antegrade flow in both ICA siphons. Dolichoectatic ICA siphons and tortuous distal cervical ICAs. No siphon stenosis. Small infundibulum suspected at the right ophthalmic artery origin common normal variant. Patent carotid termini. Normal MCA and ACA origins. Tortuous proximal ACA is. Visible ACA branches are within normal limits. Bilateral MCA M1 segments are within normal limits. Patent MCA trifurcations. No MCA stenosis or branch occlusion identified. IMPRESSION: 1. Negative for emergent large vessel occlusion. 2. Generalized arterial dolichoectasia.  No intracranial stenosis. 3. Diminutive or chronically thrombosed distal left vertebral artery. Electronically Signed   By: Genevie Ann M.D.   On: 12/09/2016 10:51     Assessment and plan discussed with with attending physician and they are in agreement.    Etta Quill PA-C [facility] [phone]  12/09/2016, 2:19 PM   Assessment: 76 y.o. female presenting to the hospital with chest pain and dizziness along with presyncopal symptoms. Given the nature of the history and the bilateral infarcts would definitely like to obtain a bilateral carotid Dopplers. MRI does show bilateral punctate temporal lobe infarcts.  Stroke Risk Factors - hypertension  Recommend: At this point I would recommend finishing up stroke  workup with obtaining A1c, she needs to have a carotid Doppler before leaving the hospital., Would place her on low-dose aspirin and confirmed with renal if this is ok.  Patient is to follow with neurology as outpatient at discharge.

## 2016-12-10 ENCOUNTER — Inpatient Hospital Stay (HOSPITAL_COMMUNITY): Payer: Medicare Other

## 2016-12-10 DIAGNOSIS — I6340 Cerebral infarction due to embolism of unspecified cerebral artery: Principal | ICD-10-CM

## 2016-12-10 DIAGNOSIS — N185 Chronic kidney disease, stage 5: Secondary | ICD-10-CM

## 2016-12-10 DIAGNOSIS — I10 Essential (primary) hypertension: Secondary | ICD-10-CM

## 2016-12-10 DIAGNOSIS — I350 Nonrheumatic aortic (valve) stenosis: Secondary | ICD-10-CM

## 2016-12-10 DIAGNOSIS — I348 Other nonrheumatic mitral valve disorders: Secondary | ICD-10-CM

## 2016-12-10 DIAGNOSIS — R000 Tachycardia, unspecified: Secondary | ICD-10-CM

## 2016-12-10 DIAGNOSIS — I059 Rheumatic mitral valve disease, unspecified: Secondary | ICD-10-CM

## 2016-12-10 DIAGNOSIS — H8110 Benign paroxysmal vertigo, unspecified ear: Secondary | ICD-10-CM

## 2016-12-10 DIAGNOSIS — E785 Hyperlipidemia, unspecified: Secondary | ICD-10-CM

## 2016-12-10 DIAGNOSIS — I3481 Nonrheumatic mitral (valve) annulus calcification: Secondary | ICD-10-CM

## 2016-12-10 DIAGNOSIS — E440 Moderate protein-calorie malnutrition: Secondary | ICD-10-CM

## 2016-12-10 DIAGNOSIS — Z6823 Body mass index (BMI) 23.0-23.9, adult: Secondary | ICD-10-CM

## 2016-12-10 DIAGNOSIS — D631 Anemia in chronic kidney disease: Secondary | ICD-10-CM

## 2016-12-10 LAB — HEMOGLOBIN A1C
Hgb A1c MFr Bld: 5.1 % (ref 4.8–5.6)
MEAN PLASMA GLUCOSE: 100 mg/dL

## 2016-12-10 MED ORDER — ACETAMINOPHEN 500 MG PO TABS
1000.0000 mg | ORAL_TABLET | Freq: Three times a day (TID) | ORAL | Status: DC | PRN
  Administered 2016-12-10: 1000 mg via ORAL
  Filled 2016-12-10: qty 2

## 2016-12-10 MED ORDER — ONDANSETRON 4 MG PO TBDP: 4.0000 mg | ORAL_TABLET | Freq: Four times a day (QID) | ORAL | 0 refills | Status: DC | PRN

## 2016-12-10 MED ORDER — ASPIRIN EC 325 MG PO TBEC: 325.0000 mg | DELAYED_RELEASE_TABLET | Freq: Every day | ORAL | Status: DC

## 2016-12-10 MED ORDER — ONDANSETRON 4 MG PO TBDP
4.0000 mg | ORAL_TABLET | Freq: Four times a day (QID) | ORAL | Status: DC | PRN
  Filled 2016-12-10: qty 1

## 2016-12-10 MED ORDER — ONDANSETRON 4 MG PO TBDP
4.0000 mg | ORAL_TABLET | Freq: Three times a day (TID) | ORAL | Status: DC | PRN
  Administered 2016-12-10: 4 mg via ORAL
  Filled 2016-12-10: qty 1

## 2016-12-10 MED ORDER — ACETAMINOPHEN 325 MG PO TABS
650.0000 mg | ORAL_TABLET | Freq: Four times a day (QID) | ORAL | Status: DC | PRN
  Administered 2016-12-10: 650 mg via ORAL
  Filled 2016-12-10 (×2): qty 2

## 2016-12-10 NOTE — Progress Notes (Signed)
STROKE TEAM PROGRESS NOTE   HISTORY OF PRESENT ILLNESS (per record) Nancy [name] is an 76 y.o. female with end-stage CKD5 not on HD per her preferences, came to the emergency departments because of intermittent dizziness that lasted for several minutes. Given persistent dizziness and MRI was done to rule out posterior CVA. There is no cerebellar infarctions, instead there were 3 subcentimeter acute infarcts in the bilateral temporal lobes.   In speaking to the patient she states that she felt as though she was walking on water and was off balance. She denied any vertigo and there are multiple questions that I asked about vertigo and again she denied feeling internal or external vertigo. She felt" dizzy". She also states that she felt as though she was going to pass out but when she sat down or lay down those symptoms abated. Patient denies any foot flopping, or abnormal heart rhythms. Patient does not have a history of A. fib or any heart rhythms. Patient does have aortic stenosis. MRI that was obtained does show bilateral infarcts and the echo has been obtained but has not been read at this point. Patient has refused her carotid Doppler secondary to knee pain and could not withstand. I've explained to the patient's family that it is important to have his carotid Dopplers done to finish up the stroke workup. Patient does not take an aspirin on a daily basis.  She was last known well 12/09/2016, time unable to be determined. Patient was not administered IV t-PA secondary to no findings. She was admitted for further evaluation and treatment.   SUBJECTIVE (INTERVAL HISTORY) No family at bedside. She recounted HPI with me. She said she had two episode of dizziness on standing up Sunday and Monday. Sunday, she was getting up from bathtub and felt dizzy lasted about 20sec. Once she lay down she felt better. Yesterday it was similar episode, she stood up and felt dizzy with sweating and chest pain. Once she  lay down, symptoms resolved. Her MRI showed punctate DWI changes may be due to subacute infarcts which can not explain her symptoms. Her orthostatic vital is not impressive today.    OBJECTIVE Temp:  [97.6 F (36.4 C)-98.3 F (36.8 C)] 97.6 F (36.4 C) (06/05 0505) Pulse Rate:  [96-110] 110 (06/05 0825) Cardiac Rhythm: Sinus tachycardia (06/05 0700) Resp:  [18-22] 22 (06/05 0825) BP: (135-181)/(63-94) 158/79 (06/05 0825) SpO2:  [97 %-100 %] 100 % (06/05 0825)  CBC:  Recent Labs Lab 12/08/16 0845 12/09/16 0129  WBC 7.3 6.7  HGB 10.0* 8.9*  HCT 32.3* 29.0*  MCV 87.1 86.1  PLT 180 778    Basic Metabolic Panel:  Recent Labs Lab 12/08/16 0845 12/09/16 0129  NA 139 137  K 3.9 3.6  CL 112* 112*  CO2 12* 12*  GLUCOSE 104* 89  BUN 109* 104*  CREATININE 10.66* 10.33*  CALCIUM 8.5* 8.1*    Lipid Panel:    Component Value Date/Time   CHOL 109 12/09/2016 0129   TRIG 110 12/09/2016 0129   HDL 37 (L) 12/09/2016 0129   CHOLHDL 2.9 12/09/2016 0129   VLDL 22 12/09/2016 0129   LDLCALC 50 12/09/2016 0129   HgbA1c:  Lab Results  Component Value Date   HGBA1C 5.1 12/09/2016   Urine Drug Screen:    Component Value Date/Time   LABOPIA NONE DETECTED 05/22/2016 1400   COCAINSCRNUR NONE DETECTED 05/22/2016 1400   COCAINSCRNUR NEG 09/21/2009 2031   LABBENZ NONE DETECTED 05/22/2016 1400   LABBENZ NEG  09/21/2009 2031   AMPHETMU NONE DETECTED 05/22/2016 1400   THCU NONE DETECTED 05/22/2016 1400   LABBARB NONE DETECTED 05/22/2016 1400    Alcohol Level No results found for: [place] I have personally reviewed the radiological images below and agree with the radiology interpretations. Red text is my interpretation  Ct Head Wo Contrast 12/08/2016 No acute finding by CT. Old left cerebellar infarction. Chronic small-vessel change of the cerebral hemispheric white matter.   Mr Brain Wo Contrast 12/08/2016 1. Three subcentimeter acute infarcts located in the bilateral medial  temporal lobes and inferior left frontal white matter. 2. Mild to moderate chronic small vessel ischemia in the cerebral white matter. 3. Remote left inferior cerebellar hemorrhage. 4. Diffuse abnormal marrow signal as seen with anemia or generalized lymphoproliferative disease. Correlate with CBC. The DWI signals are faint, which may consistent with subacute infarcts  Mr Virgel Paling 12/09/2016 1. Negative for emergent large vessel occlusion. 2. Generalized arterial dolichoectasia.  No intracranial stenosis. 3. Diminutive or chronically thrombosed distal left vertebral artery.   TTE - LVEF 55-60%.   Moderate aortic stenosis. Mild aortic regurgitation.   There are severe posterior mitral annular calcifications that are   extending into the posterior leaflet. The calcifications create   mass-like appearance and could potentially be source of embolism.   There is mild mitral stenosis.  CUS - Findings consistent with a 1- 39 percent stenosis involving the right internal carotid artery and the left internal carotid artery.  Right vertebral artery is patet and antegrade.  Left vertebral artery is patent but demonstrates atypical flow.  PHYSICAL EXAM  Temp:  [97.6 F (36.4 C)-98.3 F (36.8 C)] 97.6 F (36.4 C) (06/05 0505) Pulse Rate:  [96-110] 104 (06/05 1412) Resp:  [18-22] 18 (06/05 1412) BP: (135-181)/(63-95) 181/95 (06/05 1412) SpO2:  [97 %-100 %] 100 % (06/05 1412)  General - Well nourished, well developed, in no apparent distress.  Ophthalmologic - Fundi not visualized due to noncooperative.  Cardiovascular - Regular rate and rhythm.  Mental Status -  Level of arousal and orientation to time, place, and person were intact. Language including expression, naming, repetition, comprehension was assessed and found intact.  Cranial Nerves II - XII - II - Visual field intact OU. III, IV, VI - Extraocular movements intact. V - Facial sensation intact bilaterally. VII - Facial movement  intact bilaterally. VIII - Hearing & vestibular intact bilaterally. X - Palate elevates symmetrically. XI - Chin turning & shoulder shrug intact bilaterally. XII - Tongue protrusion intact.  Motor Strength - The patient's strength was normal in all extremities except RLE 3/5 proximal and knee flexion (chronic as per pt due to arthritis) and pronator drift was absent.  Bulk was normal and fasciculations were absent.   Motor Tone - Muscle tone was assessed at the neck and appendages and was normal.  Reflexes - The patient's reflexes were 1+ in all extremities and she had no pathological reflexes.  Sensory - Light touch, temperature/pinprick were assessed and were symmetrical.    Coordination - The patient had normal movements in the hands with no ataxia or dysmetria.  Tremor was absent.  Gait and Station - deferred.   ASSESSMENT/PLAN [name] is a 76 y.o. female with history of CKD stage 5 w/ refusal of HD, AS, prior stroke, HTN presenting with persistent dizziness. She did not receive IV t-PA due to no findings.   Positional dizziness - multifactorial, could be due to anemia, tachycardia, ESRD, hypertensive encephalopathy, ?  BPPV   Dizziness associated with standing up   Orthostatic vital this admission not impressive  Could be due to ESRD with uncontrolled HTN   Could be due to anemia and tachycardia  Recommend better BP control  Recommend to work up on anemia and tachycardia  May consider PT maneuver training for potential BPPV  Stroke, incidental finding -  Bilateral mesial temporal and L frontal white matter puntate subacute infarcts, embolic pattern, secondary to unclear source but concerning for mitral valve mass-like calcification on TTE  CT head no acute finding. Old L cerebellar infarct. Small vessel disease.   MRI head 3 b/l mesial temporal lobe and L frontal WM infarcts. Small vessel disease. Old L cerebellar hemorrhage. Abn marrow signal  (anemia/lymphoproliferative dz)  MRA head no LVO. Dolichoectasia. Chronic L VA small/thrombosed  Carotid Doppler unremarkable  2D Echo  EF 55-60%, mass like MV calcification, potentially embolic source. May consider cardiology consultation and work up depending on her palliative care status.  LDL 50  HgbA1c 5.1  hepatitis for VTE prophylaxis  Diet Heart Room service appropriate? Yes; Fluid consistency: Thin  Diet - low sodium heart healthy  No antithrombotic prior to admission, now on aspirin 81 mg daily. Recommend ASA 325mg  daily if not contraindicated.  Patient counseled to be compliant with her antithrombotic medications  Ongoing aggressive stroke risk factor management  Therapy recommendations:  HH OT, PT  Disposition:  Return home  ESRD not on HD  Pt refused HD  Consider palliative care management  Anemia with tachycardia  Etiology unclear, could be due to ESRD  Further work up as per primary team depending on palliative care status  Hypertension  On the high side  Pt punctate stroke likely subacute, ok to resume home meds for BP control from stroke standpoint.  Long-term BP goal normotensive  Other Stroke Risk Factors  Advanced age  Hx cocaine abuse in the past  Hx stroke/TIA  12/2007 cerebellar hemorrhage  Coronary artery disease, CP this adm, troponins neg   Aortic stenosis  Other Active Problems  Advanced CKD,stage 5 / ESRD not an HD candidate per her choice  Mild nalnutrition  Hospital day # 1  Neurology will sign off. Please call with questions. Pt will follow up with Dr. Erlinda Hong at [facility] in about 6 weeks. Thanks for the consult.  Rosalin Hawking, MD PhD Stroke Neurology 12/10/2016 3:20 PM   To contact Stroke Continuity provider, please refer to [email or web address] After hours, contact General Neurology

## 2016-12-10 NOTE — Progress Notes (Signed)
Subjective: Patient reports nausea.  Patient denies any chest pain, shortness of breath, and minimal dizziness.  Objective:  Vital signs in last 24 hours: Vitals:   12/10/16 0821 12/10/16 0823 12/10/16 0825 12/10/16 1412  BP: (!) 174/94 (!) 181/86 (!) 158/79 (!) 181/95  Pulse: (!) 103 (!) 104 (!) 110 (!) 104  Resp: 20 20 (!) 22 18  Temp:      TempSrc:      SpO2: 99% 100% 100% 100%  Weight:      Height:       Physical Exam  Constitutional: She is well-developed, well-nourished, and in no distress.  Cardiovascular: Normal rate.   3/6 early systolic murmur at the right upper sternal border  Pulmonary/Chest: Effort normal and breath sounds normal. No respiratory distress. She has no wheezes. She has no rales.  Abdominal: Soft. She exhibits no distension. There is no tenderness.  Musculoskeletal: She exhibits no edema.  Skin: Skin is warm and dry.     Assessment/Plan:  Principal Problem:   Acute ischemic stroke [facility]) Active Problems:   Hyperlipidemia   Essential hypertension   CKD (chronic kidney disease) stage 5, GFR less than 15 ml/min (HCC)   Aortic stenosis   Dizziness   Malnutrition of moderate degree  CVA MRA of head was negative for large vessel occlusion.  Echo results pending.  Telemetry was personally reviewed and did not notice A.fib.  The patient's presentation with bilateral acute strokes is most likely embolic from A. Fib. I would not recommend further workup at this point as patient is stage V CKD and does not want dialysis.  Will add aspirin 81 mg on discharge. - aspirin 81mg    Dizziness Likely BPPV given the timing of the symptoms.  -zofran        CKD5/ESRD Nausea today.  Bicarb 12, creatinine 10, bun 104.  Patient has uremia.  Patient does not want to pursue dialysis and has also elected to be DNR.  The disease process was explained at length to the patient and patient's sister in the room.  Patient states she would like to be at home and when the  time came pass away at home.  Patient was offered home hospice and agreed at the time of IM team rounding in the morning. However, when case management discussed case with patient, patient refused home hospice. I personally went and talked to the patient again and patient stated that she did not want home hospice.  I explained the nature of her disease and the likely progression of symptoms and how these symptoms could be better handled by the hospice team at home.  She stated she thinks she has years to live and this is "all in God's hands" she refused home health services as well.  -no home hospice  -continue home renvela -continue home sodium bicarb - zofran  HTN Will not add any blood pressure medications at this time on discharge. -continue blood pressure medications  Aortic Stenosis Loud systolic murmur at RUSB and eco from 2014 which showed mild AS.  Patient will have echo during this admission as part of the stroke workup.  Can further assess changes in aortic stenosis. - Echo results pending   Anemia of chronic disease Hgb is stable   Non anion gap metabolic acidosis Takes sodium bicarbonate 650mg  twice a day - Continue sodium bicarb  CAD Coronary artery calcifications noted on CT abdomen/pelvis in 2015.  Patient has home nitroglycerin.  Patient has complained of chest pain during  admission. Troponins have been negative 3 and no EKG changes have been noted. -aspirin 81mg    Dispo: Anticipated discharge today.  Patient has followup with primary care on June 13th at [place], [place], DO 12/10/2016, 2:39 PM Pager: [phone]

## 2016-12-10 NOTE — Evaluation (Signed)
Speech Language Pathology Evaluation Patient Details Name: Nancy B Mecham MRN: [id number] DOB: 1940/12/04 Today's Date: 12/10/2016 Time: 3785-8850 SLP Time Calculation (min) (ACUTE ONLY): 14 min  Problem List:  Patient Active Problem List   Diagnosis Date Noted  . Acute ischemic stroke ([place]) 12/09/2016  . Malnutrition of moderate degree 12/09/2016  . Dizziness 12/08/2016  . Constipation 10/15/2016  . Angina at rest [facility])   . Goals of care, counseling/discussion   . Palliative care encounter   . Normal anion gap metabolic acidosis 27/74/1287  . Aortic stenosis 05/21/2016  . Anemia of chronic disease 02/01/2016  . Atherosclerosis of aorta ([place]) 01/11/2015  . CKD (chronic kidney disease) stage 5, GFR less than 15 ml/min (HCC) 02/04/2013  . Glaucoma 03/18/2012  . Health care maintenance 09/17/2011  . Headache(784.0) 09/21/2009  . Osteoarthrosis involving lower leg 10/31/2008  . History of CVA (cerebrovascular accident) 01/28/2008  . Hyperlipidemia 02/13/2007  . PULMONARY NODULES 01/12/2007  . Left ventricular hypertrophy 09/02/2006  . FIBROIDS, UTERUS 08/11/2006  . Essential hypertension 08/11/2006  . GERD 08/11/2006  . Diverticulitis of colon 08/11/2006   Past Medical History:  Past Medical History:  Diagnosis Date  . Aortic stenosis   . Bacterial sinusitis 09/17/2011  . CKD (chronic kidney disease) stage 4, GFR 15-29 ml/min ([place]) 08/11/2006   Cr continues to increase. Proteinuria on UA 02/10/12.    . Colitis   . CVA (cerebrovascular accident) [facility])    New hemorrhagic per CT scan '09  . Diverticulosis of colon   . Dysfunctional uterine bleeding   . Fecal impaction ([place])   . Headache(784.0)   . HERNIORRHAPHY, HX OF 08/11/2006  . Hypertension   . OA (osteoarthritis)    bilateral knees  . Postmenopausal   . Pulmonary nodule   . TINEA CRURIS 01/12/2007   Past Surgical History:  Past Surgical History:  Procedure Laterality Date  . ABDOMINAL HYSTERECTOMY    . CHOLECYSTECTOMY   2009  . INGUINAL HERNIA REPAIR  2008  . IRIDOTOMY / IRIDECTOMY     Laser, right eye 12/26/11 left eye 01/24/12  . MASS EXCISION Left 05/07/2013   Procedure: EXCISION CYST;  Surgeon: Myrtha Mantis., MD;  Location: [place];  Service: Ophthalmology;  Laterality: Left;   HPI:  Nancy [name] is a 76 y.o. female with history of CKD 5, HTN, pulmonary nodule who presents with dizziness that started this morning when she was standing and getting ready in the bathroom to go to church. MRI three subcentimeter acute infarcts located in the bilateral medial temporal lobes and inferior left frontal white matter, mild to moderate chronic small vessel ischemia in the cerebral white matter, remote left inferior cerebellar hemorrhage   Assessment / Plan / Recommendation Clinical Impression  Pt lives at home with her husband and plans are for discharge home today. Her cognitive function appears to be appropriate for her level of activity and ADL's at home. She does have premorbid visual disturbance noted by this SLP and confirmed with pt and sister. She scored in the mild impairment range with memory and reasoining. Educated pt re: memory strategies and pt reports she utilizes calendar for appointments. Do not recommend further ST however pt would benefit from intermittent check in from sister/family.     SLP Assessment  SLP Recommendation/Assessment: Patient does not need any further Speech [place] [facility] Services SLP Visit Diagnosis: Cognitive communication deficit (R41.841)    Follow Up Recommendations  None    Frequency and Duration  SLP Evaluation Cognition  Overall Cognitive Status: Impaired/Different from baseline Arousal/Alertness: Awake/alert Orientation Level: Oriented X4 Attention: Sustained Sustained Attention: Appears intact Memory: Impaired Memory Impairment: Retrieval deficit (mild) Awareness: Appears intact Problem Solving: Appears  intact Safety/Judgment: Appears intact       Comprehension  Auditory Comprehension Overall Auditory Comprehension: Appears within functional limits for tasks assessed Visual Recognition/Discrimination Discrimination: Not tested Reading Comprehension Reading Status: Not tested    Expression Expression Primary Mode of Expression: Verbal Verbal Expression Overall Verbal Expression: Appears within functional limits for tasks assessed Initiation: No impairment Level of Generative/Spontaneous Verbalization: Conversation Repetition: Impaired Naming: No impairment Pragmatics: No impairment Written Expression Dominant Hand: Right Written Expression: Not tested   Oral / Motor  Oral Motor/Sensory Function Overall Oral Motor/Sensory Function: Within functional limits Motor Speech Overall Motor Speech: Appears within functional limits for tasks assessed Respiration: Within functional limits Phonation: Normal Resonance: Within functional limits Articulation: Within functional limitis Intelligibility: Intelligible Motor Planning: Witnin functional limits   GO                    Houston Siren 12/10/2016, 12:20 PM   Orbie Pyo Natashia Roseman M.Ed [employer] [phone]

## 2016-12-10 NOTE — Progress Notes (Signed)
*  PRELIMINARY RESULTS* Vascular Ultrasound Carotid Duplex (Doppler) has been completed.  Preliminary findings: Findings consistent with a 1- 30 percent stenosis involving the right internal carotid artery and the left internal carotid artery.  Right vertebral artery is patet and antegrade.  Left vertebral artery is patent but demonstrates atypical flow.  Everrett Coombe 12/10/2016, 11:24 AM

## 2016-12-10 NOTE — Progress Notes (Signed)
Physical Therapy Treatment Patient Details Name: Nancy [name] MRN: [id number] DOB: 15-Nov-1940 Today's Date: 12/10/2016    History of Present Illness Nancy [name] is a 76 y.o. female with history of CKD5, HTN, AS who presents with Dizziness that started this morning when she was standing and getting ready in the bathroom to go to church.     PT Comments    Pt making steady improvements.  No longer dizzy, gait stability improved, but pt still not scanning well without some instability.  Still see potential for improvements with HHPT   Follow Up Recommendations  Home health PT;Supervision/Assistance - 24 hour     Equipment Recommendations  None recommended by PT    Recommendations for Other Services       Precautions / Restrictions Precautions Precautions: Fall    Mobility  Bed Mobility Overal bed mobility: Modified Independent             General bed mobility comments: no rails, extra time  Transfers Overall transfer level: Needs assistance Equipment used: Straight cane Transfers: Sit to/from Stand Sit to Stand: Supervision         General transfer comment: for safety  Ambulation/Gait Ambulation/Gait assistance: Supervision Ambulation Distance (Feet): 160 Feet Assistive device: Straight cane Gait Pattern/deviations: Step-through pattern   Gait velocity interpretation: Below normal speed for age/gender General Gait Details: slow, generally steady, does not scan normally, but able to look left and right tentatively.   Stairs            Wheelchair Mobility    Modified Rankin (Stroke Patients Only) Modified Rankin (Stroke Patients Only) Pre-Morbid Rankin Score: Slight disability Modified Rankin: Moderate disability     Balance Overall balance assessment: Needs assistance Sitting-balance support: No upper extremity supported Sitting balance-Leahy Scale: Good       Standing balance-Leahy Scale: Fair                               Cognition Arousal/Alertness: Awake/alert Behavior During Therapy: WFL for tasks assessed/performed Overall Cognitive Status: Within Functional Limits for tasks assessed                                        Exercises      General Comments General comments (skin integrity, edema, etc.): sats  mid 90%      Pertinent Vitals/Pain Pain Assessment: Faces Faces Pain Scale: Hurts a little bit Pain Location: B knees Pain Descriptors / Indicators: Aching;Grimacing Pain Intervention(s): Monitored during session    Home Living     Available Help at Discharge: Family Type of Home: Apartment              Prior Function            PT Goals (current goals can now be found in the care plan section) Acute Rehab PT Goals Patient Stated Goal: To return home to independent PT Goal Formulation: With patient Time For Goal Achievement: 12/16/16 Potential to Achieve Goals: Good Progress towards PT goals: Progressing toward goals    Frequency    Min 3X/week      PT Plan Current plan remains appropriate    Co-evaluation              AM-PAC PT "6 Clicks" Daily Activity  Outcome Measure  Difficulty turning over in bed (including adjusting bedclothes, sheets  and blankets)?: None Difficulty moving from lying on back to sitting on the side of the bed? : None Difficulty sitting down on and standing up from a chair with arms (e.g., wheelchair, bedside commode, etc,.)?: A Little Help needed moving to and from a bed to chair (including a wheelchair)?: A Little Help needed walking in hospital room?: A Little Help needed climbing 3-5 steps with a railing? : A Little 6 Click Score: 20    End of Session   Activity Tolerance: Patient tolerated treatment well Patient left: with call bell/phone within reach;in bed;with bed alarm set Nurse Communication: Mobility status PT Visit Diagnosis: Other abnormalities of gait and mobility (R26.89)     Time:  3462-1947 PT Time Calculation (min) (ACUTE ONLY): 20 min  Charges:  $Gait Training: 8-22 mins                    G Codes:       12-30-2016  Donnella Sham, PT [phone]  (pager)   Nancy [name] 2016/12/30, 3:10 PM

## 2016-12-10 NOTE — Progress Notes (Signed)
During report at 0705 this morning, patient stated she was feeling nauseated again. Not time for PRN zofran. [facility] RN Georgina Peer paged Internal medicine teaching services to obtain order for med for patient's nausea. No new orders at this time.

## 2016-12-10 NOTE — Progress Notes (Signed)
Patient complaining of headache and nausea. BP was 159/78 with HR 99, no signs of acute distress noted. No PRNs available to give at that time. [name],MD notified. MD returned page and changed time parameters for tylenol and placed order for PRN q8hours for Zofran. RN gave both of these meds to patient for headache and nausea. Will continue to monitor and treat per MD orders.

## 2016-12-10 NOTE — Progress Notes (Signed)
Pt asked earlier this am for a bath. Pt and tech planned bath for after breakfast, however when tech offered Pt a bath. Pt stated she would like to wait due to feeling dizzy. Pt resting in bed.

## 2016-12-10 NOTE — Progress Notes (Signed)
CM received consult:Discussed with patient and family, they are agreeable for home hospice. Please assist with home hospice needs. CM spoke  with pt and daughter, Deloris Ping, @ bedside regarding  home hospice. Pt states she doesn't need home hospice and declines any home health services. Pt states she can care for self. CM shared with pt PT's  and OT's evaluation/recommendations:  PT- Home health PT;Supervision/Assistance - 24 hour OT- Home health OT;Supervision/Assistance - 24 hour  Pt continues to state she will care for self, and doesn't want any home health services @ d/c. Whitman Hero RN,BSN,CM

## 2016-12-10 NOTE — Discharge Summary (Signed)
Name: Nancy [name] MRN: [id number] DOB: 09-08-1940 76 y.o. PCP: Sid Falcon, MD  Date of Admission: 12/08/2016  8:29 AM Date of Discharge: 12/10/2016 Attending Physician: Axel Filler  Discharge Diagnosis: 1. Acute ischemic stroke 2. Benign positional paroxysmal vertigo 3. Chronic kidney disease stage 5 with a GFR less than 15 ml/min   Principal Problem:   CVA (cerebral vascular accident) ([place]) Active Problems:   Hyperlipidemia   Essential hypertension   CKD (chronic kidney disease) stage 5, GFR less than 15 ml/min (HCC)   Aortic stenosis   Dizziness   Malnutrition of moderate degree   Tachycardia   Mitral valve annular calcification   Discharge Medications: Allergies as of 12/10/2016      Reactions   Hydrocodone-acetaminophen Nausea And Vomiting, Other (See Comments)   Dizziness      Medication List    TAKE these medications   acetaminophen 500 MG tablet Commonly known as:  TYLENOL Take 1,000 mg by mouth every 8 (eight) hours as needed for mild pain, moderate pain, fever or headache.   amLODipine 10 MG tablet Commonly known as:  NORVASC Take 1 tablet (10 mg total) by mouth daily.   aspirin 81 MG EC tablet Take 1 tablet (81 mg total) by mouth daily.   calcitRIOL 0.25 MCG capsule Commonly known as:  ROCALTROL Take 1 capsule (0.25 mcg total) by mouth daily.   cloNIDine 0.1 mg/24hr patch Commonly known as:  CATAPRES - Dosed in mg/24 hr Place 1 patch (0.1 mg total) onto the skin every 7 (seven) days.   diclofenac sodium 1 % Gel Commonly known as:  VOLTAREN Apply 2 g topically 4 (four) times daily as needed.   docusate sodium 100 MG capsule Commonly known as:  COLACE Take 1 capsule (100 mg total) by mouth 2 (two) times daily.   famotidine 20 MG tablet Commonly known as:  PEPCID Take 1 tablet (20 mg total) by mouth 2 (two) times daily.   furosemide 40 MG tablet Commonly known as:  LASIX Take 1 tablet (40 mg total) by mouth daily.     nitroGLYCERIN 0.3 MG SL tablet Commonly known as:  NITROSTAT Place 1 tablet (0.3 mg total) under the tongue every 5 (five) minutes as needed for chest pain.   ondansetron 4 MG disintegrating tablet Commonly known as:  ZOFRAN-ODT Take 1 tablet (4 mg total) by mouth every 6 (six) hours as needed for nausea or vomiting.   RENVELA 800 MG tablet Generic drug:  sevelamer carbonate Take 800 mg by mouth 3 (three) times daily with meals.   SENSIPAR 60 MG tablet Generic drug:  cinacalcet Take 60 mg by mouth daily.   sodium bicarbonate 650 MG tablet Take 1 tablet (650 mg total) by mouth 2 (two) times daily.   traMADol 50 MG tablet Commonly known as:  ULTRAM Take 1 tablet (50 mg total) by mouth every 12 (twelve) hours as needed.       Disposition and follow-up:   Nancy [name] was discharged from [facility] in stable condition.  At the hospital follow up visit please address:  1.  Medication compliance.  Patient had run out of her blood pressure medications several days prior to admission.  2.  Labs / imaging needed at time of follow-up: none  3.  Pending labs/ test needing follow-up: Echo results  Follow-up Appointments: Follow-up Information    Sid Falcon, MD Follow up.   Specialty:  Internal Medicine Why:  June 13th  at 8:15am Contact information: [place] [address] [phone]        Rosalin Hawking, MD. Schedule an appointment as soon as possible for a visit in 6 week(s).   Specialty:  Neurology Contact information: [address] [place] [phone] [place] [facility] Course by problem list: Principal Problem:   CVA (cerebral vascular accident) [facility]) Active Problems:   Hyperlipidemia   Essential hypertension   CKD (chronic kidney disease) stage 5, GFR less than 15 ml/min (HCC)   Aortic stenosis   Dizziness   Malnutrition of moderate degree   Tachycardia   Mitral valve annular  calcification   Cerebral vascular accident Patient reports intermittent dizziness and an MRI was obtained. Imaging showed 3 subcentimeter acute infarcts located in the bilateral medial temporal lobes and inferior left frontal white matter. However, these findings do not explain her symptom of dizziness.  Neurology was consulted. MRA of head was negative for large vessel occlusion.  Echo results pending.  Telemetry was personally reviewed and did not notice A.fib during admission.  The patient's presentation with bilateral acute strokes is most likely embolic from A. Fib.  Further workup may not be beneficial at this point as patient is CKD stage V with significant acidemia and uremia for many months and is declining dialysis. Anticoagulation may cause more harm than good in this patient.  Patient was started on aspirin and discharged on this medication.   Benign positional paroxysmal vertigo Patient presented to the ED for intermittent dizziness that lasted for several minutes. On exam patient had nystagmus when doing the Dix-Hallpike maneuver and was treated with the Epley maneuver. Patient's dizziness in likely BPPV given the timing of the symptoms. She was given Zofran during admission to help with symptoms of nausea. She was discharged with Zofran.     CKD5/ESRD She follows with [place] [facility], Dr. Mercy Moore, and has declined HD interventions. For many months she has been significantly acidemic and uremic, which are causing moderate symptoms like nausea. On admission Bicarb 12, creatinine 10, bun 109. Patient does not want to pursue dialysis and has also elected to be DNR. The disease process was explained at length to the patient and patient's sister in the room.  Patient states she would like to be at home when the time came to pass away. Patient was offered home hospice and she adamantly declined.  During admission we continued her home Renvela and sodium bicarbonate. Patient had nausea during  admission and was treated with Zofran. She was discharged with Zofran.  Hypertension She was hypertensive on admission. With findings of acute stroke on MRI her pressure medication was held and restarted on discharge.   Aortic Stenosis Loud systolic murmur at RUSB and echo from 2014 showed mild AS. Patient had an echo during this admission and results were pending on discharge.  Anemia of chronic disease Hgb was stable during admission  CAD Coronary artery calcifications noted on CT abdomen/pelvis in 2015. Patient has home nitroglycerin.  Patient complained of chest pain during admission. Troponins have been negative 3 and no EKG changes have been noted.  Aspirin was started on admission and continued on discharge.   Discharge Vitals:   BP (!) 181/95 (BP Location: Left Arm)   Pulse (!) 104   Temp 97.6 F (36.4 C) (Oral)   Resp 18   Ht 5\' 3"  (1.6 m)   Wt 133 lb 11.2 oz (60.6 kg)  SpO2 100%   BMI 23.68 kg/m   Pertinent Labs, Studies, and Procedures:  Brain  MRI, Head CT and MRA Echo  Discharge Instructions: Discharge Instructions    Diet - low sodium heart healthy    Complete by:  As directed    Discharge instructions    Complete by:  As directed    Please follow up Dr. Daryll Drown on June 13th  Please start taking aspirin 81mg  daily   Increase activity slowly    Complete by:  As directed       Signed: Valinda Party, DO 12/12/2016, 12:00 PM   Pager: [phone]

## 2016-12-11 ENCOUNTER — Encounter: Payer: Self-pay | Admitting: Internal Medicine

## 2016-12-11 ENCOUNTER — Telehealth: Payer: Self-pay | Admitting: Internal Medicine

## 2016-12-11 ENCOUNTER — Ambulatory Visit: Payer: Medicare Other

## 2016-12-11 ENCOUNTER — Ambulatory Visit (INDEPENDENT_AMBULATORY_CARE_PROVIDER_SITE_OTHER): Payer: Medicare Other | Admitting: Internal Medicine

## 2016-12-11 VITALS — BP 176/84 | HR 104 | Temp 98.2°F | Wt 135.7 lb

## 2016-12-11 DIAGNOSIS — R000 Tachycardia, unspecified: Secondary | ICD-10-CM

## 2016-12-11 DIAGNOSIS — Z79899 Other long term (current) drug therapy: Secondary | ICD-10-CM

## 2016-12-11 DIAGNOSIS — R011 Cardiac murmur, unspecified: Secondary | ICD-10-CM

## 2016-12-11 DIAGNOSIS — Z8673 Personal history of transient ischemic attack (TIA), and cerebral infarction without residual deficits: Secondary | ICD-10-CM | POA: Diagnosis not present

## 2016-12-11 DIAGNOSIS — I639 Cerebral infarction, unspecified: Secondary | ICD-10-CM

## 2016-12-11 DIAGNOSIS — I10 Essential (primary) hypertension: Secondary | ICD-10-CM | POA: Diagnosis not present

## 2016-12-11 DIAGNOSIS — Z09 Encounter for follow-up examination after completed treatment for conditions other than malignant neoplasm: Secondary | ICD-10-CM | POA: Diagnosis not present

## 2016-12-11 DIAGNOSIS — R112 Nausea with vomiting, unspecified: Secondary | ICD-10-CM | POA: Diagnosis not present

## 2016-12-11 LAB — VAS US CAROTID
LCCADDIAS: -15 cm/s
LCCADSYS: -76 cm/s
LCCAPSYS: 86 cm/s
LEFT ECA DIAS: -21 cm/s
LEFT VERTEBRAL DIAS: -10 cm/s
LICADSYS: -63 cm/s
Left CCA prox dias: 12 cm/s
Left ICA dist dias: -19 cm/s
Left ICA prox dias: -23 cm/s
Left ICA prox sys: -79 cm/s
RCCAPSYS: 75 cm/s
RIGHT ECA DIAS: -19 cm/s
RIGHT VERTEBRAL DIAS: 14 cm/s
Right CCA prox dias: 14 cm/s
Right cca dist sys: -85 cm/s

## 2016-12-11 MED ORDER — CARVEDILOL 12.5 MG PO TABS: 12.5000 mg | ORAL_TABLET | Freq: Two times a day (BID) | ORAL | 0 refills | Status: DC

## 2016-12-11 NOTE — Patient Instructions (Signed)
It was a pleasure to see you today [name].  I would like you to restart your carvedilol (Coreg) 12.5mg  twice daily. This is HALF your previous dose since you have been off this for a while.  We will need to see you next week for a repeat check up.

## 2016-12-11 NOTE — Telephone Encounter (Signed)
ER 6/3 due to dizziness. Stated " they told me I had mini stoke". Now more concerned with headache @ 10/10, unable to keep meds in stating "it comes right back up". Vomitted this am. Informed that Zofran was sent to pharmacy yesterday & will pick it up today. BP 171/95, already taken BP meds. Appt in [facility] @ 2:45, requesting to see Dr. Daryll Drown.

## 2016-12-11 NOTE — Progress Notes (Signed)
   CC: Hospital follow up for acute stroke  HPI:  Nancy [name] is a 76 y.o. woman here today with continuing headache, dizziness, and nausea since being discharged from the hospital after new stroke.   See problem based assessment and plan below for additional details  Past Medical History:  Diagnosis Date  . Aortic stenosis   . Bacterial sinusitis 09/17/2011  . CKD (chronic kidney disease) stage 4, GFR 15-29 ml/min ([place]) 08/11/2006   Cr continues to increase. Proteinuria on UA 02/10/12.    . Colitis   . CVA (cerebrovascular accident) [facility])    New hemorrhagic per CT scan '09  . Diverticulosis of colon   . Dysfunctional uterine bleeding   . Fecal impaction ([place])   . Headache(784.0)   . HERNIORRHAPHY, HX OF 08/11/2006  . Hypertension   . OA (osteoarthritis)    bilateral knees  . Postmenopausal   . Pulmonary nodule   . TINEA CRURIS 01/12/2007    Review of Systems:  Review of Systems  Constitutional: Negative for malaise/fatigue.  Cardiovascular: Negative for leg swelling.  Gastrointestinal: Negative for blood in stool and diarrhea.  Musculoskeletal: Negative for falls.  Neurological: Positive for dizziness, sensory change and headaches.    Physical Exam: Physical Exam  Constitutional:  Thin, elderly woman in no acute distress  HENT:  Head: Normocephalic and atraumatic.  Mouth/Throat: Oropharynx is clear and moist. No oropharyngeal exudate.  Cardiovascular:  Mildly tachycardic, regular, with systolic murmur loudest at LUSB  Pulmonary/Chest: Effort normal and breath sounds normal.  Musculoskeletal: She exhibits no edema.    Vitals:   12/11/16 1435  BP: (!) 176/84  Pulse: (!) 104  Temp: 98.2 F (36.8 C)  TempSrc: Oral  SpO2: 100%  Weight: 135 lb 11.2 oz (61.6 kg)    Assessment & Plan:   See Encounters Tab for problem based charting.  Patient discussed with Dr. Daryll Drown

## 2016-12-11 NOTE — Telephone Encounter (Signed)
I am in clinic.  She should be signed out to me when one of the residents see her.  Thank you!

## 2016-12-11 NOTE — Telephone Encounter (Signed)
Call from pt's daughter [name]) stating pt recently discharged from hospital.  Having some nausea and vomiting.  Cant keep food down-although she was able to keep a little bit of chicken noodle soup down this morning.  No chest pain, abd pain, shortness of breath, or diarrhea.  Will have triage nurse contact patient's dtr.Regenia Skeeter, Jai Bear Cassady6/6/201811:08 AM

## 2016-12-11 NOTE — Telephone Encounter (Signed)
Patient in the ED on 12/08/2016 has questions about taking aspirin because she is throwing up every time she takes it.

## 2016-12-12 ENCOUNTER — Telehealth: Payer: Self-pay | Admitting: *Deleted

## 2016-12-12 NOTE — ED Provider Notes (Signed)
[facility] DEPT Provider Note   CSN: [id number] Arrival date & time: 12/08/16  4970     History   Chief Complaint Chief Complaint  Patient presents with  . Weakness    HPI Nancy [name] is a 76 y.o. female.  HPI Patient presents with dizziness described as spinning sensation that started yesterday morning. No history of chronic kidney disease for which she is not on hemodialysis. No nausea associated with the dizziness. It worse with change in positions. States she's had some difficulty with fine motor skills of her right hand. She also has some right leg weakness. Denies any shortness of breath or chest pain. Past Medical History:  Diagnosis Date  . Aortic stenosis   . Bacterial sinusitis 09/17/2011  . CKD (chronic kidney disease) stage 4, GFR 15-29 ml/min ([place]) 08/11/2006   Cr continues to increase. Proteinuria on UA 02/10/12.    . Colitis   . CVA (cerebrovascular accident) [facility])    New hemorrhagic per CT scan '09  . Diverticulosis of colon   . Dysfunctional uterine bleeding   . Fecal impaction ([place])   . Headache(784.0)   . HERNIORRHAPHY, HX OF 08/11/2006  . Hypertension   . OA (osteoarthritis)    bilateral knees  . Postmenopausal   . Pulmonary nodule   . TINEA CRURIS 01/12/2007    Patient Active Problem List   Diagnosis Date Noted  . Tachycardia   . Mitral valve annular calcification   . CVA (cerebral vascular accident) ([place]) 12/09/2016  . Malnutrition of moderate degree 12/09/2016  . Dizziness 12/08/2016  . Constipation 10/15/2016  . Angina at rest [facility])   . Goals of care, counseling/discussion   . Palliative care encounter   . Normal anion gap metabolic acidosis 26/37/8588  . Aortic stenosis 05/21/2016  . Anemia in stage 5 chronic kidney disease, not on chronic dialysis ([place]) 02/01/2016  . Atherosclerosis of aorta ([place]) 01/11/2015  . CKD (chronic kidney disease) stage 5, GFR less than 15 ml/min ([facility]) 02/04/2013  . Non-intractable vomiting with nausea  08/17/2012  . Glaucoma 03/18/2012  . Health care maintenance 09/17/2011  . Headache(784.0) 09/21/2009  . Osteoarthrosis involving lower leg 10/31/2008  . History of CVA (cerebrovascular accident) 01/28/2008  . Hyperlipidemia 02/13/2007  . PULMONARY NODULES 01/12/2007  . Left ventricular hypertrophy 09/02/2006  . FIBROIDS, UTERUS 08/11/2006  . Essential hypertension 08/11/2006  . GERD 08/11/2006  . Diverticulitis of colon 08/11/2006    Past Surgical History:  Procedure Laterality Date  . ABDOMINAL HYSTERECTOMY    . CHOLECYSTECTOMY  2009  . INGUINAL HERNIA REPAIR  2008  . IRIDOTOMY / IRIDECTOMY     Laser, right eye 12/26/11 left eye 01/24/12  . MASS EXCISION Left 05/07/2013   Procedure: EXCISION CYST;  Surgeon: Myrtha Mantis., MD;  Location: [facility];  Service: Ophthalmology;  Laterality: Left;    OB History    No data available       Home Medications    Prior to Admission medications   Medication Sig Start Date End Date Taking? Authorizing Provider  cloNIDine (CATAPRES - DOSED IN MG/24 HR) 0.1 mg/24hr patch Place 1 patch (0.1 mg total) onto the skin every 7 (seven) days. 11/18/16 11/18/17 Yes Sid Falcon, MD  diclofenac sodium (VOLTAREN) 1 % GEL Apply 2 g topically 4 (four) times daily as needed. 06/18/16  Yes Sid Falcon, MD  acetaminophen (TYLENOL) 500 MG tablet Take 1,000 mg by mouth every 8 (eight) hours as needed for mild  pain, moderate pain, fever or headache.     [provider]  amLODipine (NORVASC) 10 MG tablet Take 1 tablet (10 mg total) by mouth daily. 10/01/16   Norman Herrlich, MD  aspirin EC 81 MG EC tablet Take 1 tablet (81 mg total) by mouth daily. 12/10/16   Valinda Party, DO  calcitRIOL (ROCALTROL) 0.25 MCG capsule Take 1 capsule (0.25 mcg total) by mouth daily. 10/10/16   Sid Falcon, MD  carvedilol (COREG) 12.5 MG tablet Take 1 tablet (12.5 mg total) by mouth 2 (two) times daily with a meal. 12/11/16   Rice,  Resa Miner, MD  docusate sodium (COLACE) 100 MG capsule Take 1 capsule (100 mg total) by mouth 2 (two) times daily. 10/15/16   Rice, Resa Miner, MD  famotidine (PEPCID) 20 MG tablet Take 1 tablet (20 mg total) by mouth 2 (two) times daily. 11/05/16   Sid Falcon, MD  furosemide (LASIX) 40 MG tablet Take 1 tablet (40 mg total) by mouth daily. 02/07/16   Oval Linsey, MD  nitroGLYCERIN (NITROSTAT) 0.3 MG SL tablet Place 1 tablet (0.3 mg total) under the tongue every 5 (five) minutes as needed for chest pain. 09/22/16   Minus Liberty, MD  ondansetron (ZOFRAN-ODT) 4 MG disintegrating tablet Take 1 tablet (4 mg total) by mouth every 6 (six) hours as needed for nausea or vomiting. 12/10/16   Kalman Shan Ratliff, DO  RENVELA 800 MG tablet Take 800 mg by mouth 3 (three) times daily with meals.  05/06/16   [provider]  SENSIPAR 60 MG tablet Take 60 mg by mouth daily. 05/03/16   [provider]  sodium bicarbonate 650 MG tablet Take 1 tablet (650 mg total) by mouth 2 (two) times daily. 10/29/16 10/29/17  Sid Falcon, MD  traMADol (ULTRAM) 50 MG tablet Take 1 tablet (50 mg total) by mouth every 12 (twelve) hours as needed. 06/25/16 06/25/17  Norman Herrlich, MD    Family History Family History  Problem Relation Age of Onset  . Hypertension Mother   . Heart attack Mother   . Heart disease Mother     Social History Social History  Substance Use Topics  . Smoking status: Never Smoker  . Smokeless tobacco: Never Used  . Alcohol use No     Allergies   Hydrocodone-acetaminophen   Review of Systems Review of Systems  Constitutional: Negative for chills and fever.  Respiratory: Negative for cough and shortness of breath.   Cardiovascular: Negative for chest pain.  Gastrointestinal: Negative for abdominal pain, diarrhea, nausea and vomiting.  Musculoskeletal: Negative for back pain and neck pain.  Skin: Negative for rash and wound.  Neurological: Positive  for dizziness and weakness. Negative for light-headedness, numbness and headaches.     Physical Exam Updated Vital Signs BP (!) 181/95 (BP Location: Left Arm)   Pulse (!) 104   Temp 97.6 F (36.4 C) (Oral)   Resp 18   Ht 5\' 3"  (1.6 m)   Wt 60.6 kg (133 lb 11.2 oz)   SpO2 100%   BMI 23.68 kg/m   Physical Exam  Constitutional: She is oriented to person, place, and time. She appears well-developed and well-nourished. No distress.  HENT:  Head: Normocephalic and atraumatic.  Mouth/Throat: Oropharynx is clear and moist. No oropharyngeal exudate.  Eyes: EOM are normal. Pupils are equal, round, and reactive to light.  No obvious nystagmus  Neck: Normal range of motion. Neck supple.  No posterior midline cervical tenderness  to palpation.  Cardiovascular: Normal rate and regular rhythm.  Exam reveals no gallop and no friction rub.   No murmur heard. Pulmonary/Chest: Effort normal and breath sounds normal. No respiratory distress. She has no wheezes. She has no rales. She exhibits no tenderness.  Abdominal: Soft. Bowel sounds are normal. There is no tenderness. There is no rebound and no guarding.  Musculoskeletal: Normal range of motion. She exhibits no edema or tenderness.  Neurological: She is alert and oriented to person, place, and time.  4/5 motor in right lower extremity. 4/5 grip strength in right hand. 5/5 motor in left upper and lower extremities. Sensation intact. Questionable pass pointing with the left upper extremity ring finger to nose testing.  Skin: Skin is warm and dry. Capillary refill takes less than 2 seconds. No rash noted. No erythema.  Psychiatric: She has a normal mood and affect. Her behavior is normal.  Nursing note and vitals reviewed.    ED Treatments / Results  Labs (all labs ordered are listed, but only abnormal results are displayed) Labs Reviewed  BASIC METABOLIC PANEL - Abnormal; Notable for the following:       Result Value   Chloride 112 (*)     CO2 12 (*)    Glucose, Bld 104 (*)    BUN 109 (*)    Creatinine, Ser 10.66 (*)    Calcium 8.5 (*)    GFR calc non Af Amer 3 (*)    GFR calc Af Amer 4 (*)    All other components within normal limits  CBC - Abnormal; Notable for the following:    RBC 3.71 (*)    Hemoglobin 10.0 (*)    HCT 32.3 (*)    RDW 16.7 (*)    All other components within normal limits  BASIC METABOLIC PANEL - Abnormal; Notable for the following:    Chloride 112 (*)    CO2 12 (*)    BUN 104 (*)    Creatinine, Ser 10.33 (*)    Calcium 8.1 (*)    GFR calc non Af Amer 3 (*)    GFR calc Af Amer 4 (*)    All other components within normal limits  CBC - Abnormal; Notable for the following:    RBC 3.37 (*)    Hemoglobin 8.9 (*)    HCT 29.0 (*)    RDW 16.5 (*)    All other components within normal limits  LIPID PANEL - Abnormal; Notable for the following:    HDL 37 (*)    All other components within normal limits  HEMOGLOBIN A1C  TROPONIN I  TROPONIN I  TROPONIN I  GLUCOSE, CAPILLARY  I-STAT TROPOININ, ED    EKG  EKG Interpretation  Date/Time:  Sunday December 08 2016 08:34:18 EDT Ventricular Rate:  101 PR Interval:    QRS Duration: 106 QT Interval:  354 QTC Calculation: 459 R Axis:   39 Text Interpretation:  Sinus tachycardia Probable LVH with secondary repol abnrm Anterior ST elevation, probably due to LVH Confirmed by Addison Lank [phone]) on 12/09/2016 11:44:30 PM       Radiology No results found.  Procedures Procedures (including critical care time)  Medications Ordered in ED Medications  0.9 %  sodium chloride infusion ( Intravenous Stopped 12/09/16 1804)  sodium chloride 0.9 % bolus 500 mL (0 mLs Intravenous Stopped 12/08/16 1217)  sodium chloride 0.9 % bolus 500 mL (500 mLs Intravenous New Bag/Given 12/09/16 0653)     Initial Impression / Assessment  and Plan / ED Course  I have reviewed the triage vital signs and the nursing notes.  Pertinent labs & imaging results that were available  during my care of the patient were reviewed by me and considered in my medical decision making (see chart for details).     Patient with previous cerebellar infarct. Discussed with teaching service and will admit. Will need a ride to rule out acute infarct.  Final Clinical Impressions(s) / ED Diagnoses   Final diagnoses:  Dizziness  CVA (cerebral vascular accident) [facility])    New Prescriptions Discharge Medication List as of 12/10/2016  2:54 PM    START taking these medications   Details  aspirin EC 81 MG EC tablet Take 1 tablet (81 mg total) by mouth daily., Starting Tue 12/10/2016, Normal    ondansetron (ZOFRAN-ODT) 4 MG disintegrating tablet Take 1 tablet (4 mg total) by mouth every 6 (six) hours as needed for nausea or vomiting., Starting Tue 12/10/2016, Normal         Julianne Rice, MD 12/12/16 1417

## 2016-12-12 NOTE — Telephone Encounter (Signed)
Call from pt's daughter, [name] - states new rx for Carvedilol 12.5 mg written yesterday; she had called the [facility] and was told it was sent out to pt on 6/2 (home delivery).   I called [facility] to be sure 12.5 mg was delivered pt not 25 mg. I was told the same 12.5 mg was delivered on the 2nd.   Called [name] back and explained the correct dosage had been delivered per the pharmacy. But she wanted to know how to identify which pill since all of pt's meds comes in pill packages. Told her to call the pharmacy. Also let Dr Benjamine Mola know pt has been on this dosage of 12.5 mg.

## 2016-12-13 NOTE — Assessment & Plan Note (Signed)
She reports nausea, loss of appetite, and episodes of vomiting over the past 2 days. She was ordered Zofran for this after calling the office but has not picked up the medication yet. There is no hematemesis and no other GI complaints.  Nausea and vomiting, possibly related to her CKD5 any chronic uremia. This could also be worsened by recent CVA and dizziness sensations.   Recommended she try the zofran for her symptoms. Call back or RTC if symptoms progress or she develops hematemesis.

## 2016-12-13 NOTE — Assessment & Plan Note (Signed)
HPI: Blood pressure is uncontrolled today at 176/84.She was recently seen in the hospital for acute stroke and blood pressure meds temporarily held for this reason. She was also out of her home prescriptions prior to the admission. She reports ongoing episodes of dizziness since discharge. She is currently wearing her clonidine patch.  A: Uncontrolled hypertension, 3 days post stroke evaluation  P: Continue transdermal clonidine Resume coreg at 12.5mg  BID with interruption in treatment and some dizziness with walking Amlodipine 10mg  (not withheld)

## 2016-12-13 NOTE — Progress Notes (Signed)
Internal Medicine Clinic Attending  Case discussed with Dr. Rice at the time of the visit.  We reviewed the resident's history and exam and pertinent patient test results.  I agree with the assessment, diagnosis, and plan of care documented in the resident's note.  

## 2016-12-13 NOTE — Assessment & Plan Note (Signed)
HPI: Multiple new acute infarcts seen on 6/3 admission. Since this time she continues to have headaches and dizziness. It is unclear if these are related to her uncontrolled blood pressure (she was off medications PTA) or her CVA itself. Her functional status is fair completing ADLs such as grooming, bathroom, but not able to maintain her household and cook very well according to her daughter.  A: Recent hospitalization for acute bilateral temporal lobe infarcts with persistent dizziness, headaches  P: Continuing on low dose aspirin alone given her other life limiting comorbidities and expressed wishes Offered a course of tramadol for headache, she was not interested at this time unless HA worsened

## 2016-12-16 ENCOUNTER — Other Ambulatory Visit: Payer: Self-pay

## 2016-12-16 NOTE — Patient Outreach (Signed)
[facility]) Care Management  12/16/2016  Nancy [name] 02/20/1941 [id number]   EMMI: Stroke Referral date: 12/16/16 Referral source: EMMI Stroke red alert Referral reason: Feeling worse overall: YES Day # 3 For stroke antiplatelets and anticoagulants Attempt #1  Telephone call to patient regarding EMMI stroke red alert. Unable to reach patient. Phone rang busy.      PLAN:  RNCM will attempt 2nd telephone call to patient within 3 business days.  Quinn Plowman RN,BSN,CCM [facility] Telephonic  [phone]

## 2016-12-17 ENCOUNTER — Other Ambulatory Visit: Payer: Self-pay

## 2016-12-17 ENCOUNTER — Encounter (HOSPITAL_COMMUNITY): Payer: Medicare Other

## 2016-12-17 NOTE — Patient Outreach (Signed)
[facility]) Care Management  12/17/2016  Nancy [name] [birth year] [id number]   EMMI: Stroke Referral date: 12/16/16 Referral source: EMMI Stroke red alert Referral reason: Feeling worse overall: YES Day # 3 For stroke antiplatelets and anticoagulants Attempt #2  2nd EMMI stroke alert Referral date: 12/17/16 Referral source: EMMI Stroke red alert Reason for 2nd EMMI red alert; New problems with walking, talking, speaking, seeing: YES Day#6  Second telephone call to patient regarding EMMI stroke red alert. Second EMMI stroke red alert received on patient today 12/17/16.  Unable to reach patient at listed home number.  Phone rang busy.  Attempted listed emergency contact number. Phone was disconnected.   PLAN; RNCM will attempt 3rd telephone outreach to patient within 3 business days.   Quinn Plowman RN,BSN,CCM [facility] Telephonic  [phone]

## 2016-12-18 ENCOUNTER — Other Ambulatory Visit: Payer: Self-pay

## 2016-12-18 ENCOUNTER — Inpatient Hospital Stay (HOSPITAL_COMMUNITY)
Admission: RE | Admit: 2016-12-18 | Discharge: 2016-12-18 | Disposition: A | Discharge status: Home / Self Care | Payer: Medicare Other | Source: Ambulatory Visit | Attending: Nephrology | Admitting: Nephrology

## 2016-12-18 ENCOUNTER — Encounter: Payer: Medicare Other | Admitting: Internal Medicine

## 2016-12-18 ENCOUNTER — Encounter: Payer: Self-pay | Admitting: Internal Medicine

## 2016-12-18 NOTE — Patient Outreach (Signed)
[facility]) Care Management  12/18/2016  [place] B Boike 10/23/1940 [id number]   EMMI: Stroke Referral date: 12/16/16 Referral source: EMMI Stroke red alert Referral reason: Feeling worse overall: YES Day # 3 For stroke antiplatelets and anticoagulants Attempt #2  2nd EMMI stroke alert Referral date: 12/17/16 Referral source: EMMI Stroke red alert Reason for 2nd EMMI red alert; New problems with walking, talking, speaking, seeing: YES Day#6  Telephone call to patient regarding EMMI stroke follow up. HIPAA verified with patient. Patient states she has not been feeling well. Patient reports she has had a headache off and on since she was discharged from the hospital. Patient states she has also had right knee pain since she was discharged from the hospital. Patient reports her pain level to be a 9.   Patient states she was scheduled to see her primary MD today but cancelled due to her not feeling well. Patient states she did not reschedule the appointment.  Patient states she has taken tylenol for the pain with some relief. Patient states she checks her blood pressure sometimes and it is up and down. Patient reports she did not check her blood pressure today. Patient states she is not taking any of her medications. Patient states her medications make her feel worse so she does not take them. Patient states she does have dizziness at times.  Patient states she does not know if she is suppose to have home health or not.  Patient states she has transportation through [place] transportation and [employer].  Patient states she has to give them notice if she needs transportation set up. Patient states she has a granddaughter that is able to help with transportation when she is not working.  RNCM advised patient to go to urgent care /ED due to pain severity of headache.  Patient states she does not like going to urgent cares, she would rather be seen by her doctor.  RNCM advised patient  to call 911 for severe symptoms.  Discussed signs/ symptoms of stroke with patient. RNCM advised patient on importance of taking her medications as prescribed.  RNCM advised patient to call and reschedule appointment with her primary MD and neurologist, Dr. Erlinda Hong. RNCM instructed patient on how to contact her doctors after hours.  RNCM advised patient to notify MD of any changes in condition prior to scheduled appointment. RNCM offered to provided contact name and number: [phone] or main office number [phone] and 24 hour nurse advise line [phone]. Patient states she has the information from the last visit she had with a [facility] case Freight forwarder.  RNCM verified patient aware of 911 services for urgent/ emergent needs.  PLAN; RNCM will follow up with patient within 3 business days.  RNCM will call patient and assist with arranging follow up appointment with MD.   Quinn Plowman RN,BSN,CCM [facility] Telephonic  [phone]

## 2016-12-18 NOTE — Progress Notes (Signed)
Pt did not show up today for her scheduled shot appointment

## 2016-12-19 ENCOUNTER — Other Ambulatory Visit: Payer: Self-pay

## 2016-12-19 NOTE — Patient Outreach (Signed)
[facility]) Care Management  12/19/2016  Nancy [name] [birth year] [id number]  EMMI: Stroke Referral date: 12/16/16 Referral source: EMMI Stroke red alert Referral reason: Feeling worse overall: YES Day # 3 For stroke antiplatelets and anticoagulants   2nd EMMI stroke alert Referral date: 12/17/16 Referral source: EMMI Stroke red alert Reason for 2nd EMMI red alert; New problems with walking, talking, speaking, seeing: YES Day#6  Telephone call to patient for EMMI stroke follow up. HIPAA verified with patient. Patient states she has not called her doctor yet. Patient states she woke up late and is eating her breakfast. Patient request call back from [facility] at a later time.   PLAN:  RNCM will follow up with patient within 1 business day.  Quinn Plowman RN,BSN,CCM [facility] Telephonic  [phone]

## 2016-12-20 ENCOUNTER — Other Ambulatory Visit: Payer: Self-pay

## 2016-12-20 NOTE — Patient Outreach (Signed)
[facility]) Care Management  12/20/2016  Nancy [name] [birth year] [id number]  EMMI: Stroke Referral date: 12/16/16 Referral source: EMMI Stroke red alert Referral reason: Feeling worse overall: YES Day # 3 For stroke antiplatelets and anticoagulants Attempt #2  2nd EMMI stroke alert Referral date: 12/17/16 Referral source: EMMI Stroke red alert Reason for 2nd EMMI red alert; New problems with walking, talking, speaking, seeing: YES Day#6  Telephone call to patient regarding EMMI stroke red alert. HIPAA verified with patient. Patient states she rescheduled her primary MD appointment for January 15, 2017.  Patient states she has kidney problems. Patient states she had a kidney doctor, Dr. Tammi Klippel but he stopped seeing her. Patient states she needs help getting another kidney doctor.  Patient reports she is not taking any of her medications because they make her sick.  RNCM discussed and offered [facility] care management services. Patient states she is aware of [facility] services. Patient verbally agreed to [facility] services for community nurse and pharmacist follow up.  Patient confirmed she was recently in the hospital for stroke. Denies having any current home care services.  Patient states she has not scheduled a follow up with the neurologist and she will wait for her primary MD to tell her to do so.  RNCM offered to give patient name and contact number for neurologist. Patient refused to take information stating she will wait to discuss with her primary MD.   ASSESSMENT: Date of Admission: 12/08/2016  8:29 AM Date of Discharge: 12/10/2016 Discharge Diagnosis: 1. Acute ischemic stroke 2. Benign positional paroxysmal vertigo 3. Chronic kidney disease stage 5 with a GFR less than 15 ml/min  She follows with [place] [facility], Dr. Mercy Moore, and has declined HD interventions. For many months she has been significantly acidemic and uremic, which are causing moderate symptoms like nausea. Patient  does not want to pursue dialysis and has also elected to be DNR.  PLAN; RNCM will refer patient to community case manager and pharmacist.   Quinn Plowman RN,BSN,CCM [facility] Telephonic  [phone]

## 2016-12-24 ENCOUNTER — Other Ambulatory Visit: Payer: Self-pay

## 2016-12-24 NOTE — Patient Outreach (Signed)
[place] [facility]) Care Management  12/24/2016  [place] B B[place]ave 1941/05/25 [id number]   EMMI: Stroke Referral date: 12/16/16 Referral source: EMMI Stroke red alert Referral reason: Feeling worse overall: YES Day # 13 Went to follow-up appointment Attempt #1  Telephone call to patient regarding EMMI stroke red alert. Unable to reach patient. HIPAA compliant voice message left with call back phone number.  PLAN:   RNCM will refer to community case manager for follow up  Quinn Plowman RN,BSN,CCM [facility] Telephonic  [phone]

## 2016-12-26 ENCOUNTER — Other Ambulatory Visit: Payer: Self-pay

## 2016-12-26 ENCOUNTER — Ambulatory Visit: Payer: Self-pay

## 2016-12-26 MED ORDER — CLONIDINE HCL 0.1 MG/24HR TD PTWK: 0.1000 mg | MEDICATED_PATCH | TRANSDERMAL | 0 refills | Status: DC

## 2016-12-26 NOTE — Telephone Encounter (Signed)
   cloNIDine (CATAPRES - DOSED IN MG/24 HR) 0.1 mg/24hr patch, refill request @ [facility].

## 2017-01-01 ENCOUNTER — Other Ambulatory Visit: Payer: Self-pay | Admitting: *Deleted

## 2017-01-01 NOTE — Patient Outreach (Signed)
[facility]) Care Management  01/01/2017  [place] B Dobbins 1941/03/09 [id number]   Referral received from telephonic care manager to engage member with community care management services as she recently had stroke.  Call placed to member to perform telephone assessment and attempt to schedule home visit, phone rang busy.  Unable to leave message.  Will follow up tomorrow.  [name], [place], MSN [place] [phone]

## 2017-01-02 ENCOUNTER — Other Ambulatory Visit: Payer: Self-pay | Admitting: Pharmacist

## 2017-01-02 ENCOUNTER — Other Ambulatory Visit: Payer: Self-pay | Admitting: *Deleted

## 2017-01-02 NOTE — Patient Outreach (Addendum)
[facility]) Care Management  01/02/2017  [place] B Corti Aug 22, 1940 [id number]  Patient was referred to [facility] Pharmacist by [facility] RN Lamar Blinks as patient reported not taking any of her medications because they make her sick.    Of note, chart review shows patient has Stage 5 chronic kidney disease and refused dialysis.  She was recently in the hospital this month with a CVA and was acidemic and uremic.  Successful phone outreach to patient, HIPAA details verified. Explained purpose of call and she consent to call.    Patient reports she gets medications refilled and delivered in bubble packs by [facility].  She reports only medication she is taking at this time is her clonidine patch.  She reports she is not taking any oral medications from her blister packages at this time.  She reports something makes her feel nauseous and she won't take her medications.   She was not able to review her medications at time of call.   Discussed with patient her blood pressure was high when she saw Dr Benjamine Mola in an attempt to explain the purpose of her blood pressure medications, and she still reports she is only going to use her clonidine patch.   Attempted to explain purpose of her kidney related medications, and patient reports "my kidneys are fine."  Patient reports she gets her medications delivered each month and "puts them away."    Plan:  Patient has PCP appointment scheduled for 01/15/17.  Will send this note to PCP to make PCP aware patient is only using clonidine patch.    Will follow-up with patient after she sees [facility] RN and after she sees PCP to see if patient is willing to take any other medications.  Karrie Meres, PharmD, [place] [phone]

## 2017-01-02 NOTE — Patient Outreach (Addendum)
[place] [facility]) Care Management  01/02/2017  Nancy [name] 09/29/1940 [id number]   2nd attempt to contact member for telephone assessment successful.  She provides verbal consent for involvement.  She report that she has follow appointment with her primary MD on 7/11, last visit was the day after her discharge.  She denies appointment with neurologist stating that she was not told to follow up with them.  Advised to discuss with primary during visit.  She complains of her head hurting constantly, denies having blood pressure machine to monitor.  This care manager inquired about alternate ways to monitor blood pressure, such as family/friends bringing over a machine or going to the local drug store.  She state her granddaughter has one but is unable to bring it due to work.  She state she only go out on the 3rd of the month when she get her check.    She report she need a new nephrologist as she was told by [facility] to "not come back."  She expresses frustration about the need of a new MD, stating there is nothing wrong with her kidneys because she still urinates.  She also expresses frustration regarding the connection of her blood pressure, her stroke, and her kidney disease.  Progression & complications of untreated/uncontrolled hypertension discussed, complications of her refusal to have dialysis also discussed.  She agrees to home visit next week, will complete initial assessment and develop individualized care plan at that time.  Will discuss progression of her condition if left untreated at that time as well.  [name], [place], MSN [place] [phone]

## 2017-01-06 ENCOUNTER — Encounter: Payer: Self-pay | Admitting: *Deleted

## 2017-01-06 ENCOUNTER — Other Ambulatory Visit: Payer: Self-pay | Admitting: *Deleted

## 2017-01-06 NOTE — Patient Outreach (Signed)
[name]) Care Management   01/06/2017  Nancy [name] 26-Oct-1940 [id number]  Nancy [name] is an 76 y.o. female  Subjective:   Member alert and oriented x3.  She denies any pain or discomfort at this time, does report some right knee pain intermittently.  She has medications in packaged form, but denies taking anything but the clonidine patch.  Objective:   Review of Systems  Constitutional: Negative.   HENT: Negative.   Eyes: Positive for blurred vision.  Respiratory: Negative.   Cardiovascular: Negative.   Gastrointestinal: Negative.   Genitourinary: Negative.   Musculoskeletal: Positive for joint pain.  Skin: Negative.   Neurological: Negative.   Endo/Heme/Allergies: Negative.   Psychiatric/Behavioral: Negative.     Physical Exam  Constitutional: She is oriented to person, place, and time. She appears well-developed and well-nourished.  Neck: Normal range of motion.  Cardiovascular: Normal rate, regular rhythm and normal heart sounds.   Respiratory: Effort normal and breath sounds normal.  GI: Soft. Bowel sounds are normal.  Musculoskeletal: Normal range of motion.  Neurological: She is alert and oriented to person, place, and time.  Skin: Skin is warm and dry.   BP (!) 146/82 (BP Location: Left Arm, Patient Position: Sitting, Cuff Size: Normal)   Pulse (!) 105   Resp 18   Ht 1.6 m ('5\' 3"' )   Wt 135 lb (61.2 kg)   SpO2 99%   BMI 23.91 kg/m   Encounter Medications:   Outpatient Encounter Prescriptions as of 01/06/2017  Medication Sig  . acetaminophen (TYLENOL) 500 MG tablet Take 1,000 mg by mouth every 8 (eight) hours as needed for mild pain, moderate pain, fever or headache.   . cloNIDine (CATAPRES - DOSED IN MG/24 HR) 0.1 mg/24hr patch Place 1 patch (0.1 mg total) onto the skin every 7 (seven) days.  Nancy [name] Kitchen amLODipine (NORVASC) 10 MG tablet Take 1 tablet (10 mg total) by mouth daily. (Patient not taking: Reported on 01/06/2017)  . aspirin EC 81 MG  EC tablet Take 1 tablet (81 mg total) by mouth daily. (Patient not taking: Reported on 01/06/2017)  . calcitRIOL (ROCALTROL) 0.25 MCG capsule Take 1 capsule (0.25 mcg total) by mouth daily. (Patient not taking: Reported on 01/06/2017)  . carvedilol (COREG) 12.5 MG tablet Take 1 tablet (12.5 mg total) by mouth 2 (two) times daily with a meal. (Patient not taking: Reported on 01/06/2017)  . diclofenac sodium (VOLTAREN) 1 % GEL Apply 2 g topically 4 (four) times daily as needed. (Patient not taking: Reported on 01/06/2017)  . docusate sodium (COLACE) 100 MG capsule Take 1 capsule (100 mg total) by mouth 2 (two) times daily. (Patient not taking: Reported on 01/06/2017)  . famotidine (PEPCID) 20 MG tablet Take 1 tablet (20 mg total) by mouth 2 (two) times daily. (Patient not taking: Reported on 01/06/2017)  . furosemide (LASIX) 40 MG tablet Take 1 tablet (40 mg total) by mouth daily. (Patient not taking: Reported on 01/06/2017)  . nitroGLYCERIN (NITROSTAT) 0.3 MG SL tablet Place 1 tablet (0.3 mg total) under the tongue every 5 (five) minutes as needed for chest pain. (Patient not taking: Reported on 01/06/2017)  . ondansetron (ZOFRAN-ODT) 4 MG disintegrating tablet Take 1 tablet (4 mg total) by mouth every 6 (six) hours as needed for nausea or vomiting. (Patient not taking: Reported on 01/06/2017)  . RENVELA 800 MG tablet Take 800 mg by mouth 3 (three) times daily with meals.   . SENSIPAR 60 MG tablet Take 60 mg by  mouth daily.  . sodium bicarbonate 650 MG tablet Take 1 tablet (650 mg total) by mouth 2 (two) times daily. (Patient not taking: Reported on 01/06/2017)  . traMADol (ULTRAM) 50 MG tablet Take 1 tablet (50 mg total) by mouth every 12 (twelve) hours as needed. (Patient not taking: Reported on 01/06/2017)   No facility-administered encounter medications on file as of 01/06/2017.     Functional Status:   In your present state of health, do you have any difficulty performing the following activities: 01/06/2017 12/11/2016   Hearing? N N  Vision? Y N  Difficulty concentrating or making decisions? N Y  Walking or climbing stairs? Y Y  Dressing or bathing? N N  Doing errands, shopping? Nancy [name]  Preparing Food and eating ? N -  Using the Toilet? N -  In the past six months, have you accidently leaked urine? N -  Do you have problems with loss of bowel control? N -  Managing your Medications? Y -  Managing your Finances? N -  Housekeeping or managing your Housekeeping? Y -  Some recent data might be hidden    Fall/Depression Screening:    Fall Risk  01/06/2017 12/11/2016 10/29/2016  Falls in the past year? Yes Yes Yes  Number falls in past yr: '1 1 1  ' Injury with Fall? No No No  Risk for fall due to : - Impaired mobility -  Risk for fall due to (comments): - - -  Follow up Falls prevention discussed Falls prevention discussed -   PHQ 2/9 Scores 12/20/2016 12/11/2016 10/29/2016 10/15/2016 10/01/2016 06/25/2016 12/11/2015  PHQ - 2 Score 0 0 0 0 0 0 0    Assessment:    Met with member at scheduled time, husband present during visit.  [facility] care management services again explained, consent signed.  She expresses concern about medications, stating that they "make me sick."  She is advised that symptoms may be from complications of uncontrolled conditions as medications were prescribed to treat conditions.  She state she is willing to consider taking them, but only after her appointment with her primary MD next week.  Denies need for transportation, state she uses Medicaid transportation.  She has not seen a neurologist as recommended at discharge, stating no one told her that she needed to.  Discharge instructions reviewed, offered to schedule appointment for her, she declines offer, state she will schedule independently.  Name of recommended neurologist, address, and phone number written in [facility] calendar book for member.  Member expressed the need for new nephrologist, stating that her previous one told her not to come back.   After discussing further, she state that she was told that she needed dialysis and refused.  She is requesting new nephrologist hoping they will not recommend dialysis.  Member advised that her creatinine is greater than 10, more than 10 times the normal range.  Complications of not taking medications as prescribed and not having dialysis, including potential death.  As previous notes have stated, member is candidate for palliative care due to her unwillingness to have dialysis.    Palliative care versus dialysis discuss with member.  She is not interested in neither at this time.  Advised to reconsider as not having dialysis will lead to death.  She state she is reluctant to have dialysis because she is scared that also could lead to death.  Advised that there is a risk either way, but urged to consider.  Advised that palliative care will assist with  goals of care and potential end of life care should she choose that option.  She would like think about it and discuss with primary MD next week.    She report she has had difficulty maintaining the upkeep of her home due to her symptoms and having to help care for husband as he is currently on hospice.  She is requesting personal care assistance, will place referral to social worker.  She denies any other urgent needs at this time, contact information for this care manager provided, advised to contact with concerns.  Plan:   Will follow up with member within the next 2 weeks.  THN CM Care Plan Problem One     Most Recent Value  Care Plan Problem One  Risk for stroke related to hypertension as evidenced by recent stroke  Role Documenting the Problem One  Care Management [place] for Problem One  Active  [facility] Long Term Goal   Member will verbalize understanding of signs/symptoms of stroke within the next 31 days  THN Long Term Goal Start Date  01/06/17  Interventions for Problem One Long Term Goal  Member educated on signs/symptoms of  stroke and its correlation to uncontrolled hypertension  THN CM Short Term Goal #1   Member will report taking medications as prescribed over the next 4 weeks  THN CM Short Term Goal #1 Start Date  01/06/17  Interventions for Short Term Goal #1  Medications reviewed, advised to take in effort to control blood pressure as well as other symptoms/conditions  THN CM Short Term Goal #2   Member will have new blood pressure monitor within the next 4 weeks  THN CM Short Term Goal #2 Start Date  01/06/17  Interventions for Short Term Goal #2  Member catalog reviewed with patient, appropriate blood pressure monitor identified, advised to order as soon as possible  THN CM Short Term Goal #3  Member will have appointment scheduled with neurologist within the next 4 weeks  THN CM Short Term Goal #3 Start Date  01/06/17  Interventions for Short Tern Goal #3  Discharge instructions reviewed with member, advised that it was recommended to follow up with neurologist.  Educated on importance of follow up with neurologist folloing stroke, advised to make appointment as soon as possible      [name], BSN, [place] Manager [phone]

## 2017-01-15 ENCOUNTER — Encounter: Payer: Self-pay | Admitting: Internal Medicine

## 2017-01-15 ENCOUNTER — Ambulatory Visit (INDEPENDENT_AMBULATORY_CARE_PROVIDER_SITE_OTHER): Payer: Medicare Other | Admitting: Internal Medicine

## 2017-01-15 VITALS — BP 156/69 | HR 100 | Temp 98.0°F | Ht 63.0 in | Wt 131.2 lb

## 2017-01-15 DIAGNOSIS — I10 Essential (primary) hypertension: Secondary | ICD-10-CM

## 2017-01-15 DIAGNOSIS — I120 Hypertensive chronic kidney disease with stage 5 chronic kidney disease or end stage renal disease: Secondary | ICD-10-CM | POA: Diagnosis not present

## 2017-01-15 DIAGNOSIS — N2581 Secondary hyperparathyroidism of renal origin: Secondary | ICD-10-CM

## 2017-01-15 DIAGNOSIS — K219 Gastro-esophageal reflux disease without esophagitis: Secondary | ICD-10-CM | POA: Diagnosis not present

## 2017-01-15 DIAGNOSIS — D631 Anemia in chronic kidney disease: Secondary | ICD-10-CM

## 2017-01-15 DIAGNOSIS — Z79899 Other long term (current) drug therapy: Secondary | ICD-10-CM

## 2017-01-15 DIAGNOSIS — IMO0002 Reserved for concepts with insufficient information to code with codable children: Secondary | ICD-10-CM

## 2017-01-15 DIAGNOSIS — M1710 Unilateral primary osteoarthritis, unspecified knee: Secondary | ICD-10-CM

## 2017-01-15 DIAGNOSIS — Z8249 Family history of ischemic heart disease and other diseases of the circulatory system: Secondary | ICD-10-CM

## 2017-01-15 DIAGNOSIS — I208 Other forms of angina pectoris: Secondary | ICD-10-CM

## 2017-01-15 DIAGNOSIS — N185 Chronic kidney disease, stage 5: Secondary | ICD-10-CM

## 2017-01-15 DIAGNOSIS — I350 Nonrheumatic aortic (valve) stenosis: Secondary | ICD-10-CM

## 2017-01-15 MED ORDER — ONDANSETRON 4 MG PO TBDP: 4.0000 mg | ORAL_TABLET | Freq: Four times a day (QID) | ORAL | 0 refills | Status: DC | PRN

## 2017-01-15 MED ORDER — FAMOTIDINE 20 MG PO TABS: 20.0000 mg | ORAL_TABLET | Freq: Two times a day (BID) | ORAL | 0 refills | Status: DC

## 2017-01-15 MED ORDER — CAPSAICIN-MENTHOL-METHYL SAL 0.025-1-12 % EX CREA: TOPICAL_CREAM | CUTANEOUS | 1 refills | Status: DC

## 2017-01-15 MED ORDER — SENSIPAR 60 MG PO TABS: 60.0000 mg | ORAL_TABLET | Freq: Every day | ORAL | 6 refills | Status: DC

## 2017-01-15 MED ORDER — SODIUM BICARBONATE 650 MG PO TABS: 650.0000 mg | ORAL_TABLET | Freq: Two times a day (BID) | ORAL | 1 refills | Status: DC

## 2017-01-15 MED ORDER — CLONIDINE HCL 0.2 MG/24HR TD PTWK: 0.2000 mg | MEDICATED_PATCH | TRANSDERMAL | 2 refills | Status: DC

## 2017-01-15 MED ORDER — CALCITRIOL 0.25 MCG PO CAPS: 0.2500 ug | ORAL_CAPSULE | Freq: Every day | ORAL | 0 refills | Status: DC

## 2017-01-15 NOTE — Assessment & Plan Note (Addendum)
Currently not an issue.  She has chest pain which she attributes to untreated GERD.  Monitor.  The pain is non exertional, reproducible and worse with deep breathing.  She reports "nothing" being wrong with her lungs.    Her well's score is 0 - 1.5 (HR 100) which places her in low risk group.  D dimer would be unhelpful given her renal function.  CTA of the chest would not be able to be performed given her renal function.  I have low suspicion for PE at this time.

## 2017-01-15 NOTE — Assessment & Plan Note (Signed)
Continues to have pain in the right knee.  Has tried multiple modalities without improvement  Trial of capsaicin cream today.  Return in 1 month for possible repeat steroid injection.

## 2017-01-15 NOTE — Patient Instructions (Addendum)
Nancy [name] - -  Thank you for coming in today.  We will try to get you in to see a different nephrologist, but if you are not interested in dialysis, they may note see you.   Please take your medications listed in this paperwork.  I have discontinued blood pressure medications you did not want.  Please INCREASE your clonidine patch to 0.2mg  every week.   For your knee pain, please try capsaicin cream.  Please remember to wash your hands after using this and to not touch any other areas of your body.  More information below.   Please come back to see me in 1 month.  Capsaicin topical cream, lotion, solution What is this medicine? CAPSAICIN (cap SAY sin) is a pain reliever. It is used to treat pain in muscles or joints. This medicine may be used for other purposes; ask your health care provider or pharmacist if you have questions. COMMON BRAND NAME(S): [employer] for Women, Capzasin-HP, Capzasin-P, Castiva Warming, Zostrix What should I tell my health care provider before I take this medicine? They need to know if you have any of these conditions: -broken or irritated skin -an unusual or allergic reaction to capsaicin, hot peppers, other medicines, foods, dyes, or preservatives -pregnant or trying to get pregnant -breast-feeding How should I use this medicine? Use this medicine on the skin. Do not take by mouth. Follow the directions on the package or prescription label. Rub into painful area until there is little or no visible medicine left on the skin surface. Wash hands with soap and water after applying. If you are using this medicine for pain in the hands, do not wash your hands for at least 30 minutes after using this medicine. After using this medicine do not use a bandage, a heating pad, or expose the affected area to direct sun. Use your medicine at regular intervals. Do not use your medicine more often than directed. Talk to your pediatrician regarding the use of this medicine in  children. Special care may be needed. Overdosage: If you think you have taken too much of this medicine contact a poison control center or emergency room at once. NOTE: This medicine is only for you. Do not share this medicine with others. What if I miss a dose? If you miss a dose, use it as soon as you can. If it is almost time for your next dose, use only that dose. Do not use double or extra doses. What may interact with this medicine? Interactions are not expected. Do not use any other skin products on the affected area without asking your doctor or health care professional. This list may not describe all possible interactions. Give your health care provider a list of all the medicines, herbs, non-prescription drugs, or dietary supplements you use. Also tell them if you smoke, drink alcohol, or use illegal drugs. Some items may interact with your medicine. What should I watch for while using this medicine? Tell your doctor or healthcare professional if your symptoms do not start to get better or if they get worse. What side effects may I notice from receiving this medicine? Side effects that you should report to your doctor or health care professional as soon as possible: -allergic reactions like skin rash, itching or hives, swelling of the face, lips, or tongue -burning pain, redness that does not go away -cough -skin sores or thinning Side effects that usually do not require medical attention (report to your doctor or health care professional  if they continue or are bothersome): -mild stinging or warmth where used This list may not describe all possible side effects. Call your doctor for medical advice about side effects. You may report side effects to FDA at 1-800-FDA-1088. Where should I keep my medicine? Keep out of the reach of young children. Store at room temperature, between 15 and 30 degrees C (59 and 86 degrees F). Do not freeze. Protect from light. Throw away any unused medicine  after the expiration date. NOTE: This sheet is a summary. It may not cover all possible information. If you have questions about this medicine, talk to your doctor, pharmacist, or health care provider.  2018 Elsevier/Gold Standard (2011-06-10 16:38:28)

## 2017-01-15 NOTE — Assessment & Plan Note (Signed)
She has dizziness, but she is not clear on the timing of these symptoms.  She certainly has fatigue and decreased PO intake given her weight loss.  I think this is related to uremia.  She has a TTE from this year showing moderate disease.  Monitor.  TTE yearly.

## 2017-01-15 NOTE — Assessment & Plan Note (Signed)
Anemia has been worsening as she has not been getting her EPO shots.    Check CBC today  Refer to nephrology  If we cannot get her in to see another nephrologist, will need to arrange EPO shots at short stay if possible.

## 2017-01-15 NOTE — Assessment & Plan Note (Signed)
Refill famotidine, outside of blister packs, so that she can take and see if her reflux symptoms improve.   Restart famotidine.

## 2017-01-15 NOTE — Assessment & Plan Note (Signed)
Check PTH today.  I advised her to restart her calcitriol and sensipar.  I am concerned she will not do so, however.   Referral to nephrology.

## 2017-01-15 NOTE — Assessment & Plan Note (Signed)
BP is elevated today.   She reports only using her clonidine patch because the other medications make her feel sick.  She told me that she will only take the patch and maybe take her kidney medications.   Plan Increase clonidine to 0.2mg  weekly TD Check BMET today.

## 2017-01-15 NOTE — Progress Notes (Signed)
Subjective:    Patient ID: Nancy [name], female    DOB: 1940/09/29, 76 y.o.   MRN: [id number]  CC: 1 month follow up for HTN  HPI   Nancy [name] is a 76yo woman with PMH of CKD 5, recent CVA, HTN, GERD who presents for follow up HTN.  Nancy [name] recently had a CVA in June of this year.  She was admitted for a short while and sent home on aspirin and blood pressure medications.  She tells me today that she is not taking any of her medications, except for the clonidine patch, because they make her feel sick.  She gets her medications in blister packs, and she has not been taking any of them because she cannot tell which is which.  These blister packs were started in the hopes of helping decrease confusion about her medications, but instead she is now not taking anything.  She reports not being able to go back to her nephrologist b/c she has not gone through with planning for HD.    We had a long discussion about HD vs. Palliative care.  I advised her that if HD were not an option for her, palliative care and home based hospice would be a very good option.  She is adamantly opposed to either HD or hospice.  I explained to her that this left the nephrologist with very limited options, particularly because her kidneys are not likely to recover.  She still wants to see a nephrologist and is willing to travel to see another group.  She has not been taking her phosphate binders or other medications associated with her renal disease.   She has been having increased reflux and chest pain associated with this.  She notes that the pain is pleuritic in nature.  She denies chest pain with exertion, chest pain at rest, SOB with exertion.  She notes that her famotidine is also in the blister pack, so she has not been taking.   Her main concern today was right sided knee pain.  She notes that it has been hurting pretty much continuously since I last saw her.  She had a knee injection at that time which did not help.   She has some swelling but no warmth or erythema.  She has tried tylenol and voltaren gel without help.  She wanted another knee injection, but I did inform her that it was too early for another one (April was the last).   Finally, she notes cold like symptoms.  She notes rhinorrhea, cough, sore throat.  This has been ongoing for 1 month.  She reports dizziness at first, but then denied it.  I asked if she thought this was related to her stroke and she said "maybe."  She reports taking her aspirin.  She denies new weakness or falls to me.     As for her uremia symptoms, headaches have improved.  Nausea is resolved.  She has been eating and drinking ok per her.    Review of Systems  Constitutional: Positive for activity change. Negative for fatigue.  HENT: Positive for congestion, rhinorrhea and sore throat.   Respiratory: Positive for cough, chest tightness and shortness of breath. Negative for wheezing.   Cardiovascular: Negative for chest pain and leg swelling.  Gastrointestinal: Negative for abdominal pain, constipation, nausea and vomiting.  Musculoskeletal: Positive for arthralgias (knee).  Skin: Negative for color change and rash.  Neurological: Positive for dizziness and headaches. Negative for syncope and weakness.  Psychiatric/Behavioral: Negative for decreased concentration and dysphoric mood.       Objective:   Physical Exam  Constitutional: She is oriented to person, place, and time.  Very thin, elderly woman.  Appears chronically ill.   HENT:  Head: Normocephalic and atraumatic.  Eyes: Conjunctivae are normal. No scleral icterus.  Cardiovascular: Normal rate and regular rhythm.  Exam reveals no friction rub.   Murmur (systolic) heard. Pulmonary/Chest: Effort normal and breath sounds normal. No respiratory distress. She has no wheezes.  Musculoskeletal: She exhibits no edema or tenderness.  Neurological: She is alert and oriented to person, place, and time.  Psychiatric:    She does not make eye contact.  Will not answer some questions directly.      BMET, CBC, PTH today.       Assessment & Plan:  RTC in 1 month for BP follow up.

## 2017-01-15 NOTE — Assessment & Plan Note (Signed)
We spent our time focused on this today.  She is adamant in her desire to both not start HD and to not be on hospice or palliative care.  I explained to her that this tied our hands and medications will only help so much.  She was insistent that she only wants medications.  I advised her to start taking her calcitriol and sensipar again along with her bicarb.  I will update her blood work today to see if any other changes need to be made.   Re-refer to nephrology BMET, PTH today.

## 2017-01-16 LAB — CBC
HEMOGLOBIN: 8.3 g/dL — AB (ref 11.1–15.9)
Hematocrit: 26.5 % — ABNORMAL LOW (ref 34.0–46.6)
MCH: 26.8 pg (ref 26.6–33.0)
MCHC: 31.3 g/dL — AB (ref 31.5–35.7)
MCV: 86 fL (ref 79–97)
Platelets: 160 10*3/uL (ref 150–379)
RBC: 3.1 x10E6/uL — AB (ref 3.77–5.28)
RDW: 17.6 % — ABNORMAL HIGH (ref 12.3–15.4)
WBC: 6.1 10*3/uL (ref 3.4–10.8)

## 2017-01-16 LAB — BMP8+ANION GAP
ANION GAP: 22 mmol/L — AB (ref 10.0–18.0)
BUN/Creatinine Ratio: 11 — ABNORMAL LOW (ref 12–28)
BUN: 109 mg/dL (ref 8–27)
CALCIUM: 8.2 mg/dL — AB (ref 8.7–10.3)
CO2: 13 mmol/L — ABNORMAL LOW (ref 20–29)
CREATININE: 9.52 mg/dL — AB (ref 0.57–1.00)
Chloride: 108 mmol/L — ABNORMAL HIGH (ref 96–106)
GFR, EST AFRICAN AMERICAN: 4 mL/min/{1.73_m2} — AB (ref >59)
GFR, EST NON AFRICAN AMERICAN: 4 mL/min/{1.73_m2} — AB (ref >59)
Glucose: 97 mg/dL (ref 65–99)
Potassium: 5.2 mmol/L (ref 3.5–5.2)
SODIUM: 143 mmol/L (ref 134–144)

## 2017-01-16 LAB — PTH, INTACT AND CALCIUM
Calcium: 8.1 mg/dL — ABNORMAL LOW (ref 8.7–10.3)
PTH: 1680 pg/mL — ABNORMAL HIGH (ref 15–65)

## 2017-01-17 ENCOUNTER — Other Ambulatory Visit: Payer: Self-pay | Admitting: *Deleted

## 2017-01-17 NOTE — Patient Outreach (Signed)
[place] [facility]) Care Management  01/17/2017  Nancy [name] 1941-06-03 [id number]   CSW called & spoke with patient's husband, Nancy [name] to schedule a time for CSW to come out to complete initial intake assessment. CSW scheduled for next Tuesday, July 17th at 1pm. Full assessment & note to follow.    Nancy Opitz, LCSW [facility]  Clinical Social Worker cell #: [phone]

## 2017-01-20 ENCOUNTER — Other Ambulatory Visit: Payer: Self-pay | Admitting: *Deleted

## 2017-01-20 NOTE — Patient Outreach (Signed)
[facility]) Care Management  01/20/2017  Nancy [name] 1941/04/11 [id number]   Call placed to member to follow up on appointment with primary MD and decision regarding dialysis.  She report that she did meet with Dr. Daryll Drown, waiting for new nephrologist to contact her for appointment.  She state she has decided not to move forward with dialysis.  She is advised that per notes her prognosis is poor, and palliative care/hospice is recommended if she does not want dialysis.  Discussed options of palliative care again as she does not remember discussion during home visit weeks ago.   She state she will have to think about it.  Will allow time to consider, will follow up with home visit within the next 3 weeks.  She is aware that if she does not move forward with palliative care, plan of care with [facility] is limited.  She is advised that LCSW has appointment for home visit this week, she does not remember this being scheduled.  Advised that per note, LCSW spoke with husband to schedule.  Husband denies remembering scheduling appointment.  Made aware that LCSW, Gildardo Griffes, will follow up with her regarding concern for in home aide.  This care manager will follow up as planned within 3 weeks.  [name], [place], MSN [place] [phone]

## 2017-01-21 ENCOUNTER — Other Ambulatory Visit: Payer: Self-pay | Admitting: *Deleted

## 2017-01-21 ENCOUNTER — Other Ambulatory Visit: Payer: Self-pay | Admitting: Pharmacist

## 2017-01-21 ENCOUNTER — Encounter: Payer: Self-pay | Admitting: *Deleted

## 2017-01-21 NOTE — Patient Outreach (Signed)
Nancy [name]) Care Management  [name] Social Work  01/21/2017  Nancy [name] [birth year] [id number]   Encounter Medications:  Outpatient Encounter Prescriptions as of 01/21/2017  Medication Sig  . calcitRIOL (ROCALTROL) 0.25 MCG capsule Take 1 capsule (0.25 mcg total) by mouth daily.  . famotidine (PEPCID) 20 MG tablet Take 1 tablet (20 mg total) by mouth 2 (two) times daily.  . sodium bicarbonate 650 MG tablet Take 1 tablet (650 mg total) by mouth 2 (two) times daily.  Marland Kitchen acetaminophen (TYLENOL) 500 MG tablet Take 1,000 mg by mouth every 8 (eight) hours as needed for mild pain, moderate pain, fever or headache.   Marland Kitchen aspirin EC 81 MG EC tablet Take 1 tablet (81 mg total) by mouth daily. (Patient not taking: Reported on 01/06/2017)  . Capsaicin-Menthol-Methyl Sal (CAPSAICIN-METHYL SAL-MENTHOL) 0.025-1-12 % CREA Apply a small amount to area of knee pain daily.  . cloNIDine (CATAPRES - DOSED IN MG/24 HR) 0.2 mg/24hr patch Place 1 patch (0.2 mg total) onto the skin every 7 (seven) days.  . nitroGLYCERIN (NITROSTAT) 0.3 MG SL tablet Place 1 tablet (0.3 mg total) under the tongue every 5 (five) minutes as needed for chest pain. (Patient not taking: Reported on 01/06/2017)  . ondansetron (ZOFRAN-ODT) 4 MG disintegrating tablet Take 1 tablet (4 mg total) by mouth every 6 (six) hours as needed for nausea or vomiting.  . SENSIPAR 60 MG tablet Take 1 tablet (60 mg total) by mouth daily.   No facility-administered encounter medications on file as of 01/21/2017.     Functional Status:  In your present state of health, do you have any difficulty performing the following activities: 01/21/2017 01/15/2017  Hearing? N Y  Vision? Y Y  Difficulty concentrating or making decisions? N N  Walking or climbing stairs? N Y  Dressing or bathing? N N  Doing errands, shopping? Y Y  Preparing Food and eating ? - -  Using the Toilet? - -  In the past six months, have you accidently leaked urine? - -  Do  you have problems with loss of bowel control? - -  Managing your Medications? - -  Managing your Finances? - -  Housekeeping or managing your Housekeeping? - -  Some recent data might be hidden    Fall/Depression Screening:  PHQ 2/9 Scores 01/21/2017 01/15/2017 12/20/2016 12/11/2016 10/29/2016 10/15/2016 10/01/2016  PHQ - 2 Score 0 0 0 0 0 0 0    Assessment:   CSW received referral from [name], Nancy [name] 7/2 that patient is requesting personal care assistance. Patient & her husband both have poor pronosis, patient's husband recently started hospice care. Patient reports that she is unable to maintain upkeep of home. Patient is legally blind, though states that she is somewhat able to see things but not able to read. Patient's husband is able to assist with reading for patient. CSW met with patient & husband in their home today to complete intake assessments. Patient was sitting on porch when CSW arrived, patient's husband was inside on the couch watching tv. Patient states to CSW that if she could get help with light housekeeping and laundry that it would help immensely. Patient states that their home is inspected every 6 months and that if it does not meet expectations that they could be evicted. Patient's home appeared to be clean from the living room but CSW did not inspect the kitchen & bedrooms. CSW put a call out to [place] with [place] and  called [place] [facility] [place] to see if they knew of any agencies that would provide assistance. CSW is awaiting call back from Nancy [name] but Nancy [name] from [facility] [employer] informed CSW that area home health agencies will provide light housekeeping at a cost. Patient was made aware of that it would be out of pocket & is agreeable with that, but would like to see what options are available. CSW will gather information and mail to patient.   Plan:   [name] CM Care Plan Problem One     Most Recent Value  Care Plan Problem One   Patient requesting assistance with light housekeeping, unable to maintain upkeep of home  Role Documenting the Problem One  [place] for Problem One  Active  [facility] CM Short Term Goal #1    Patient will be linked with community resources as identified.  [facility] CM Short Term Goal #1 Start Date  01/21/17  Interventions for Short Term Goal #1  CSW will mail list of agencies that can assist with light housekeeping, patient agrees that she can call around      [place] will gather information to mail to patient & follow-up in 2 weeks.    Nancy Opitz, LCSW [facility]  Clinical Social Worker cell #: [phone]

## 2017-01-21 NOTE — Patient Outreach (Signed)
[place] [facility]) Care Management  01/21/2017  [place] B Micheli 10-06-1940 [id number]  Follow-up call to patient.  She answered and reported [facility] LCSW was at her home at time of call, before HIPAA identifiers could be verified.   Plan:  Will attempt to reach patient another time due to [place] with patient at this time.   Karrie Meres, PharmD, [place] [phone]

## 2017-01-21 NOTE — Patient Outreach (Signed)
[place] [facility]) Care Management  01/21/2017  [place] B Nancy [name] Apr 15, 1941 [id number]

## 2017-01-23 ENCOUNTER — Other Ambulatory Visit: Payer: Self-pay | Admitting: Pharmacist

## 2017-01-23 NOTE — Patient Outreach (Signed)
[place] [facility]) Care Management  01/23/2017  [place] B Jann 1940-09-26 [id number]  Late entry for 01/23/17.   Successful phone outreach to patient on 01/23/17.  Patient reports she received medications PCP issued following office visit 01/15/17.  She reports she is using increased clonidine patch dose.  She reports she is trying to take medications PCP prescribed at lost office visit.   She reports she no longer wishes to receive her medications in bubble packaging as she was previously getting medications delivered this way.  She did not wish to review medications.    Patient denies questions for [facility] Pharmacist.   Plan:  Will close her pharmacy episode as she declines pharmacy needs at this time.   Expect adherence may continue to be a challenge with patient and PCP is aware based on 01/15/17 office note.   Patient advised she can contact [facility] Pharmacist if she wishes to discuss medications at a later date.   Karrie Meres, PharmD, [place] [phone]

## 2017-01-31 ENCOUNTER — Telehealth: Payer: Self-pay | Admitting: Internal Medicine

## 2017-01-31 ENCOUNTER — Ambulatory Visit (INDEPENDENT_AMBULATORY_CARE_PROVIDER_SITE_OTHER): Payer: Medicare Other | Admitting: Podiatry

## 2017-01-31 ENCOUNTER — Encounter: Payer: Self-pay | Admitting: Podiatry

## 2017-01-31 DIAGNOSIS — M79675 Pain in left toe(s): Secondary | ICD-10-CM

## 2017-01-31 DIAGNOSIS — L600 Ingrowing nail: Secondary | ICD-10-CM

## 2017-01-31 DIAGNOSIS — B351 Tinea unguium: Secondary | ICD-10-CM | POA: Diagnosis not present

## 2017-01-31 DIAGNOSIS — M79674 Pain in right toe(s): Secondary | ICD-10-CM

## 2017-01-31 NOTE — Telephone Encounter (Signed)
Called and spoke with [name].  She reports that she has gotten her medications in separate bottles, however, she is still only taking the clonidine patch.  She is not very interested in taking any of her other medications.  I explained to her, given her low bicarb and elevated PTH she should at least be taking her calcitriol and sodium bicarb.  She did not seem convinced.  Multiple in home assistance groups have been contacting her.  She remains disinterested in further care and only plans to take her clonidine patch. I will continue to discuss with her when I see her next in clinic.

## 2017-01-31 NOTE — Progress Notes (Signed)
Subjective: Nancy [name] returns the office today for painful, elongated, thickened toenails which she cannot trim herself. She states "I tried cutting them myself and I think I messed up". Denies any redness or drainage around the nails. Denies any acute changes since last appointment and no new complaints today. Denies any systemic complaints such as fevers, chills, nausea, vomiting.   Objective: AAO 3, NAD DP/PT pulses palpable, CRT less than 3 seconds  Nails hypertrophic, dystrophic, elongated, brittle, discolored 10. There is tenderness overlying the nails 1-5 bilaterally. There is no surrounding erythema or drainage along the nail sites. There is incurvation of the bilateral hallux toenails with the left worse than the right.  No open lesions or pre-ulcerative lesions are identified. No other areas of tenderness bilateral lower extremities. No overlying edema, erythema, increased warmth. No pain with calf compression, swelling, warmth, erythema.  Assessment: Patient presents with symptomatic onychomycosis  Plan: -Treatment options including alternatives, risks, complications were discussed -Nails sharply debrided 10 without complication/bleeding. -Discussed daily foot inspection. If there are any changes, to call the office immediately.  -Follow-up in 3 months or sooner if any problems are to arise. In the meantime, encouraged to call the office with any questions, concerns, changes symptoms.  Celesta Gentile, DPM

## 2017-02-05 ENCOUNTER — Other Ambulatory Visit: Payer: Self-pay | Admitting: *Deleted

## 2017-02-05 NOTE — Patient Outreach (Signed)
Request received from Kelly Harrison, LCSW to mail patient personal care resources.  Information mailed today. 

## 2017-02-05 NOTE — Patient Outreach (Signed)
[facility]) Care Management  02/05/2017  Nancy [name] July 08, 1941 [id number]   CSW attempted to reach patient to follow-up on initial home visit, but phone kept ringing as busy. CSW was unable to leave a message but will try to reach patient again within a week to ensure that resources for housekeeping/personal care services have been received.    Raynaldo Opitz, LCSW [facility]  Clinical Social Worker cell #: [phone]

## 2017-02-10 ENCOUNTER — Other Ambulatory Visit: Payer: Self-pay | Admitting: *Deleted

## 2017-02-10 NOTE — Patient Outreach (Signed)
[facility] [facility]) Care Management   02/10/2017  Nancy [name] 1941-02-24 [id number]  Nancy [name] is an 76 y.o. female  Subjective:   Member alert & oriented x3, complains of pain in her right knee, denies concerns otherwise.  Advised to contact primary MD regarding pain, she state she has appointment next week, will discuss at that time.  Objective:   Review of Systems  Constitutional: Negative.   HENT: Negative.   Respiratory: Negative.   Cardiovascular: Negative.   Gastrointestinal: Negative.   Genitourinary: Negative.   Musculoskeletal: Positive for joint pain.  Skin: Negative.   Neurological: Negative.   Endo/Heme/Allergies: Negative.   Psychiatric/Behavioral: Negative.     Physical Exam  Constitutional: She is oriented to person, place, and time. She appears well-developed and well-nourished.  Neck: Normal range of motion.  Cardiovascular: Normal rate, regular rhythm and normal heart sounds.   Respiratory: Effort normal and breath sounds normal.  GI: Soft. Bowel sounds are normal.  Musculoskeletal: Normal range of motion.  Neurological: She is alert and oriented to person, place, and time.  Skin: Skin is warm and dry.   BP 118/60   Pulse 78   Resp 18   SpO2 99%   Encounter Medications:   Outpatient Encounter Prescriptions as of 02/10/2017  Medication Sig  . cloNIDine (CATAPRES - DOSED IN MG/24 HR) 0.2 mg/24hr patch Place 1 patch (0.2 mg total) onto the skin every 7 (seven) days.  Marland Kitchen acetaminophen (TYLENOL) 500 MG tablet Take 1,000 mg by mouth every 8 (eight) hours as needed for mild pain, moderate pain, fever or headache.   Marland Kitchen aspirin EC 81 MG EC tablet Take 1 tablet (81 mg total) by mouth daily. (Patient not taking: Reported on 01/06/2017)  . calcitRIOL (ROCALTROL) 0.25 MCG capsule Take 1 capsule (0.25 mcg total) by mouth daily. (Patient not taking: Reported on 02/10/2017)  . Capsaicin-Menthol-Methyl Sal (CAPSAICIN-METHYL SAL-MENTHOL) 0.025-1-12 % CREA  Apply a small amount to area of knee pain daily. (Patient not taking: Reported on 02/10/2017)  . famotidine (PEPCID) 20 MG tablet Take 1 tablet (20 mg total) by mouth 2 (two) times daily. (Patient not taking: Reported on 02/10/2017)  . nitroGLYCERIN (NITROSTAT) 0.3 MG SL tablet Place 1 tablet (0.3 mg total) under the tongue every 5 (five) minutes as needed for chest pain. (Patient not taking: Reported on 01/06/2017)  . ondansetron (ZOFRAN-ODT) 4 MG disintegrating tablet Take 1 tablet (4 mg total) by mouth every 6 (six) hours as needed for nausea or vomiting. (Patient not taking: Reported on 02/10/2017)  . SENSIPAR 60 MG tablet Take 1 tablet (60 mg total) by mouth daily. (Patient not taking: Reported on 02/10/2017)  . sodium bicarbonate 650 MG tablet Take 1 tablet (650 mg total) by mouth 2 (two) times daily. (Patient not taking: Reported on 02/10/2017)   No facility-administered encounter medications on file as of 02/10/2017.     Functional Status:   In your present state of health, do you have any difficulty performing the following activities: 01/21/2017 01/15/2017  Hearing? N Y  Vision? Y Y  Difficulty concentrating or making decisions? N N  Walking or climbing stairs? N Y  Dressing or bathing? N N  Doing errands, shopping? Y Y  Preparing Food and eating ? - -  Using the Toilet? - -  In the past six months, have you accidently leaked urine? - -  Do you have problems with loss of bowel control? - -  Managing your Medications? - -  Managing  your Finances? - -  Housekeeping or managing your Housekeeping? - -  Some recent data might be hidden    Fall/Depression Screening:    Fall Risk  01/21/2017 01/15/2017 01/06/2017  Falls in the past year? Yes Yes Yes  Number falls in past yr: '1 1 1  ' Injury with Fall? No No No  Risk for fall due to : Impaired vision Other (Comment) -  Risk for fall due to: Comment - - -  Follow up Falls prevention discussed Falls prevention discussed Falls prevention discussed    PHQ 2/9 Scores 01/21/2017 01/15/2017 12/20/2016 12/11/2016 10/29/2016 10/15/2016 10/01/2016  PHQ - 2 Score 0 0 0 0 0 0 0    Assessment:    Met with member at at scheduled time.  She continues to refuse recommended treatment plan, still noncompliant with medications with the exception of Clonidine patch.  Complications of not following treatment plan discussed, including death.  She continue to refuse, stating "I might change my mind later."  Palliative care discussed again, she continue to refuse involvement.  She is advised that this care manager will close to nursing as her only concern at this time is to get assistance in the home with housework.  She is advised to contact LCSW to continue to work on [employer].  Plan:   Will close case for nursing discipline.  Will inform primary MD of discipline closure.  [facility] CM Care Plan Problem One     Most Recent Value  Care Plan Problem One  Risk for stroke related to hypertension as evidenced by recent stroke  Role Documenting the Problem One  Care Management [place] for Problem One  Active  [facility] Long Term Goal   Member will verbalize understanding of signs/symptoms of stroke within the next 31 days  [facility] Long Term Goal Start Date  01/06/17  Interventions for Problem One Long Term Goal  Member educated on signs/symptoms of stroke and its correlation to uncontrolled hypertension  [facility] CM Short Term Goal #1   Member will report taking medications as prescribed over the next 4 weeks  [facility] CM Short Term Goal #1 Start Date  01/06/17  [facility] CM Short Term Goal #1 Met Date  -- [Goal not met]  Interventions for Short Term Goal #1  Medications reviewed, advised to take in effort to control blood pressure as well as other symptoms/conditions  [facility] CM Short Term Goal #2   Member will have new blood pressure monitor within the next 4 weeks  [facility] CM Short Term Goal #2 Start Date  01/06/17  [facility] CM Short Term Goal #2 Met Date  02/10/17  Interventions  for Short Term Goal #2  Member catalog reviewed with patient, appropriate blood pressure monitor identified, advised to order as soon as possible  [facility] CM Short Term Goal #3  Member will have appointment scheduled with neurologist within the next 4 weeks  [facility] CM Short Term Goal #3 Start Date  01/06/17  [facility] CM Short Term Goal #3 Met Date  -- [Goal not met]  Interventions for Short Tern Goal #3  Discharge instructions reviewed with member, advised that it was recommended to follow up with neurologist.  Educated on importance of follow up with neurologist folloing stroke, advised to make appointment as soon as possible     [name], Therapist, sports, MSN [place] Manager [phone]

## 2017-02-11 ENCOUNTER — Ambulatory Visit: Payer: Self-pay | Admitting: *Deleted

## 2017-02-17 ENCOUNTER — Emergency Department (HOSPITAL_COMMUNITY): Payer: Medicare Other

## 2017-02-17 ENCOUNTER — Inpatient Hospital Stay (HOSPITAL_COMMUNITY)
Admission: EM | Admit: 2017-02-17 | Discharge: 2017-02-23 | DRG: 673 | Disposition: A | Discharge status: Home / Self Care | Payer: Medicare Other | Attending: Internal Medicine | Admitting: Internal Medicine

## 2017-02-17 ENCOUNTER — Encounter (HOSPITAL_COMMUNITY): Payer: Self-pay

## 2017-02-17 DIAGNOSIS — I2510 Atherosclerotic heart disease of native coronary artery without angina pectoris: Secondary | ICD-10-CM | POA: Diagnosis not present

## 2017-02-17 DIAGNOSIS — D649 Anemia, unspecified: Secondary | ICD-10-CM | POA: Diagnosis not present

## 2017-02-17 DIAGNOSIS — I120 Hypertensive chronic kidney disease with stage 5 chronic kidney disease or end stage renal disease: Secondary | ICD-10-CM | POA: Diagnosis not present

## 2017-02-17 DIAGNOSIS — D631 Anemia in chronic kidney disease: Secondary | ICD-10-CM | POA: Diagnosis present

## 2017-02-17 DIAGNOSIS — Z888 Allergy status to other drugs, medicaments and biological substances status: Secondary | ICD-10-CM

## 2017-02-17 DIAGNOSIS — Z9181 History of falling: Secondary | ICD-10-CM | POA: Diagnosis not present

## 2017-02-17 DIAGNOSIS — N186 End stage renal disease: Secondary | ICD-10-CM | POA: Diagnosis not present

## 2017-02-17 DIAGNOSIS — S8391XA Sprain of unspecified site of right knee, initial encounter: Secondary | ICD-10-CM | POA: Diagnosis present

## 2017-02-17 DIAGNOSIS — W1830XA Fall on same level, unspecified, initial encounter: Secondary | ICD-10-CM | POA: Diagnosis present

## 2017-02-17 DIAGNOSIS — E872 Acidosis: Secondary | ICD-10-CM | POA: Diagnosis present

## 2017-02-17 DIAGNOSIS — M1710 Unilateral primary osteoarthritis, unspecified knee: Secondary | ICD-10-CM | POA: Diagnosis not present

## 2017-02-17 DIAGNOSIS — M25461 Effusion, right knee: Secondary | ICD-10-CM | POA: Diagnosis present

## 2017-02-17 DIAGNOSIS — Z6821 Body mass index (BMI) 21.0-21.9, adult: Secondary | ICD-10-CM | POA: Diagnosis not present

## 2017-02-17 DIAGNOSIS — S0990XA Unspecified injury of head, initial encounter: Secondary | ICD-10-CM | POA: Diagnosis not present

## 2017-02-17 DIAGNOSIS — K219 Gastro-esophageal reflux disease without esophagitis: Secondary | ICD-10-CM | POA: Diagnosis not present

## 2017-02-17 DIAGNOSIS — N185 Chronic kidney disease, stage 5: Secondary | ICD-10-CM | POA: Diagnosis not present

## 2017-02-17 DIAGNOSIS — N2581 Secondary hyperparathyroidism of renal origin: Secondary | ICD-10-CM | POA: Diagnosis not present

## 2017-02-17 DIAGNOSIS — Z992 Dependence on renal dialysis: Secondary | ICD-10-CM | POA: Diagnosis not present

## 2017-02-17 DIAGNOSIS — Z8673 Personal history of transient ischemic attack (TIA), and cerebral infarction without residual deficits: Secondary | ICD-10-CM | POA: Diagnosis not present

## 2017-02-17 DIAGNOSIS — M1990 Unspecified osteoarthritis, unspecified site: Secondary | ICD-10-CM | POA: Diagnosis not present

## 2017-02-17 DIAGNOSIS — M25561 Pain in right knee: Secondary | ICD-10-CM | POA: Diagnosis not present

## 2017-02-17 DIAGNOSIS — I350 Nonrheumatic aortic (valve) stenosis: Secondary | ICD-10-CM | POA: Diagnosis present

## 2017-02-17 DIAGNOSIS — Z452 Encounter for adjustment and management of vascular access device: Secondary | ICD-10-CM

## 2017-02-17 DIAGNOSIS — N179 Acute kidney failure, unspecified: Secondary | ICD-10-CM | POA: Diagnosis present

## 2017-02-17 DIAGNOSIS — R630 Anorexia: Secondary | ICD-10-CM | POA: Diagnosis present

## 2017-02-17 DIAGNOSIS — Z419 Encounter for procedure for purposes other than remedying health state, unspecified: Secondary | ICD-10-CM

## 2017-02-17 DIAGNOSIS — Z9119 Patient's noncompliance with other medical treatment and regimen: Secondary | ICD-10-CM | POA: Diagnosis not present

## 2017-02-17 DIAGNOSIS — Z9114 Patient's other noncompliance with medication regimen: Secondary | ICD-10-CM | POA: Diagnosis not present

## 2017-02-17 DIAGNOSIS — I10 Essential (primary) hypertension: Secondary | ICD-10-CM | POA: Diagnosis present

## 2017-02-17 DIAGNOSIS — Y92009 Unspecified place in unspecified non-institutional (private) residence as the place of occurrence of the external cause: Secondary | ICD-10-CM

## 2017-02-17 DIAGNOSIS — Z885 Allergy status to narcotic agent status: Secondary | ICD-10-CM | POA: Diagnosis not present

## 2017-02-17 DIAGNOSIS — Z9889 Other specified postprocedural states: Secondary | ICD-10-CM | POA: Diagnosis not present

## 2017-02-17 DIAGNOSIS — IMO0002 Reserved for concepts with insufficient information to code with codable children: Secondary | ICD-10-CM | POA: Diagnosis present

## 2017-02-17 DIAGNOSIS — M1711 Unilateral primary osteoarthritis, right knee: Secondary | ICD-10-CM | POA: Diagnosis not present

## 2017-02-17 DIAGNOSIS — R634 Abnormal weight loss: Secondary | ICD-10-CM | POA: Diagnosis present

## 2017-02-17 LAB — I-STAT CHEM 8, ED
BUN: 133 mg/dL — ABNORMAL HIGH (ref 6–20)
CHLORIDE: 118 mmol/L — AB (ref 101–111)
Calcium, Ion: 1.14 mmol/L — ABNORMAL LOW (ref 1.15–1.40)
Creatinine, Ser: 13.2 mg/dL — ABNORMAL HIGH (ref 0.44–1.00)
GLUCOSE: 115 mg/dL — AB (ref 65–99)
HEMATOCRIT: 16 % — AB (ref 36.0–46.0)
Hemoglobin: 5.4 g/dL — CL (ref 12.0–15.0)
POTASSIUM: 4.8 mmol/L (ref 3.5–5.1)
SODIUM: 143 mmol/L (ref 135–145)
TCO2: 13 mmol/L (ref 0–100)

## 2017-02-17 LAB — COMPREHENSIVE METABOLIC PANEL
ALBUMIN: 3.3 g/dL — AB (ref 3.5–5.0)
ALK PHOS: 57 U/L (ref 38–126)
ALT: 6 U/L — ABNORMAL LOW (ref 14–54)
AST: 7 U/L — AB (ref 15–41)
Anion gap: 16 — ABNORMAL HIGH (ref 5–15)
BILIRUBIN TOTAL: 0.7 mg/dL (ref 0.3–1.2)
BUN: 143 mg/dL — AB (ref 6–20)
CO2: 11 mmol/L — ABNORMAL LOW (ref 22–32)
Calcium: 8.3 mg/dL — ABNORMAL LOW (ref 8.9–10.3)
Chloride: 115 mmol/L — ABNORMAL HIGH (ref 101–111)
Creatinine, Ser: 12.13 mg/dL — ABNORMAL HIGH (ref 0.44–1.00)
GFR calc Af Amer: 3 mL/min — ABNORMAL LOW (ref >60)
GFR calc non Af Amer: 3 mL/min — ABNORMAL LOW (ref >60)
GLUCOSE: 118 mg/dL — AB (ref 65–99)
POTASSIUM: 4.9 mmol/L (ref 3.5–5.1)
Sodium: 142 mmol/L (ref 135–145)
TOTAL PROTEIN: 6.8 g/dL (ref 6.5–8.1)

## 2017-02-17 LAB — DIFFERENTIAL
BASOS ABS: 0 10*3/uL (ref 0.0–0.1)
Basophils Relative: 0 %
EOS ABS: 0.1 10*3/uL (ref 0.0–0.7)
Eosinophils Relative: 1 %
LYMPHS ABS: 0.7 10*3/uL (ref 0.7–4.0)
LYMPHS PCT: 12 %
Monocytes Absolute: 0.3 10*3/uL (ref 0.1–1.0)
Monocytes Relative: 5 %
NEUTROS PCT: 82 %
Neutro Abs: 5.2 10*3/uL (ref 1.7–7.7)

## 2017-02-17 LAB — CBC
HCT: 15.9 % — ABNORMAL LOW (ref 36.0–46.0)
HEMOGLOBIN: 5.1 g/dL — AB (ref 12.0–15.0)
MCH: 27.6 pg (ref 26.0–34.0)
MCHC: 32.1 g/dL (ref 30.0–36.0)
MCV: 85.9 fL (ref 78.0–100.0)
Platelets: 152 10*3/uL (ref 150–400)
RBC: 1.85 MIL/uL — AB (ref 3.87–5.11)
RDW: 17.3 % — ABNORMAL HIGH (ref 11.5–15.5)
WBC: 6.3 10*3/uL (ref 4.0–10.5)

## 2017-02-17 LAB — APTT: APTT: 32 s (ref 24–36)

## 2017-02-17 LAB — PROTIME-INR
INR: 1.3
Prothrombin Time: 16.3 seconds — ABNORMAL HIGH (ref 11.4–15.2)

## 2017-02-17 LAB — I-STAT TROPONIN, ED: Troponin i, poc: 0.02 ng/mL (ref 0.00–0.08)

## 2017-02-17 LAB — ABO/RH: ABO/RH(D): A POS

## 2017-02-17 LAB — PREPARE RBC (CROSSMATCH)

## 2017-02-17 LAB — CBG MONITORING, ED: Glucose-Capillary: 107 mg/dL — ABNORMAL HIGH (ref 65–99)

## 2017-02-17 MED ORDER — ACETAMINOPHEN 500 MG PO TABS
1000.0000 mg | ORAL_TABLET | Freq: Three times a day (TID) | ORAL | Status: DC | PRN
  Administered 2017-02-20: 1000 mg via ORAL
  Filled 2017-02-17 (×3): qty 2

## 2017-02-17 MED ORDER — ONDANSETRON 4 MG PO TBDP
4.0000 mg | ORAL_TABLET | Freq: Four times a day (QID) | ORAL | Status: DC | PRN
  Administered 2017-02-18 – 2017-02-21 (×3): 4 mg via ORAL
  Filled 2017-02-17 (×4): qty 1

## 2017-02-17 MED ORDER — SENNOSIDES-DOCUSATE SODIUM 8.6-50 MG PO TABS: 1.0000 | ORAL_TABLET | Freq: Every evening | ORAL | Status: DC | PRN

## 2017-02-17 MED ORDER — CINACALCET HCL 30 MG PO TABS
60.0000 mg | ORAL_TABLET | Freq: Every day | ORAL | Status: DC
  Administered 2017-02-18 – 2017-02-23 (×4): 60 mg via ORAL
  Filled 2017-02-17 (×5): qty 2

## 2017-02-17 MED ORDER — HEPARIN SODIUM (PORCINE) 5000 UNIT/ML IJ SOLN
5000.0000 [IU] | Freq: Three times a day (TID) | INTRAMUSCULAR | Status: DC
  Administered 2017-02-18 – 2017-02-22 (×11): 5000 [IU] via SUBCUTANEOUS
  Filled 2017-02-17 (×11): qty 1

## 2017-02-17 MED ORDER — TRAMADOL HCL 50 MG PO TABS
50.0000 mg | ORAL_TABLET | Freq: Two times a day (BID) | ORAL | Status: DC | PRN
  Administered 2017-02-18 – 2017-02-21 (×3): 50 mg via ORAL
  Filled 2017-02-17 (×4): qty 1

## 2017-02-17 MED ORDER — NITROGLYCERIN 0.4 MG SL SUBL
0.4000 mg | SUBLINGUAL_TABLET | SUBLINGUAL | Status: DC | PRN
  Administered 2017-02-18: 0.4 mg via SUBLINGUAL
  Filled 2017-02-17: qty 1

## 2017-02-17 MED ORDER — FAMOTIDINE 20 MG PO TABS
20.0000 mg | ORAL_TABLET | Freq: Two times a day (BID) | ORAL | Status: DC
  Administered 2017-02-18 (×3): 20 mg via ORAL
  Filled 2017-02-17 (×2): qty 1

## 2017-02-17 MED ORDER — CALCITRIOL 0.25 MCG PO CAPS
0.2500 ug | ORAL_CAPSULE | Freq: Every day | ORAL | Status: DC
  Administered 2017-02-18 – 2017-02-23 (×4): 0.25 ug via ORAL
  Filled 2017-02-17 (×5): qty 1

## 2017-02-17 MED ORDER — SODIUM CHLORIDE 0.9 % IV SOLN: Freq: Once | INTRAVENOUS | Status: DC

## 2017-02-17 MED ORDER — SODIUM BICARBONATE 650 MG PO TABS
650.0000 mg | ORAL_TABLET | Freq: Two times a day (BID) | ORAL | Status: DC
  Administered 2017-02-18 – 2017-02-23 (×10): 650 mg via ORAL
  Filled 2017-02-17 (×10): qty 1

## 2017-02-17 MED ORDER — ASPIRIN EC 81 MG PO TBEC
81.0000 mg | DELAYED_RELEASE_TABLET | Freq: Every day | ORAL | Status: DC
  Administered 2017-02-18 – 2017-02-23 (×4): 81 mg via ORAL
  Filled 2017-02-17 (×5): qty 1

## 2017-02-17 NOTE — ED Notes (Signed)
Called patient for imagining x4, no answer.

## 2017-02-17 NOTE — ED Triage Notes (Signed)
PT reports while going to restroom she tripped and landed on right knee and is now having pain in that knee.  Pt daughter is present in triage and reports she thinks her mother may have had another stroke. She reports speech is more slurred than norman and she feels the right side of face is drooping since yesterday. PT has hx of stroke bu daughter states these symptoms are new.

## 2017-02-17 NOTE — ED Provider Notes (Signed)
[facility] DEPT Provider Note   CSN: [id number] Arrival date & time: 02/17/17  1746     History   Chief Complaint Chief Complaint  Patient presents with  . Fall  . Weakness    HPI Nancy [name] is a 76 y.o. female.  She is here for evaluation of right knee pain sustained an injury yesterday. She Week. He collapsed to the floor, injuring her right knee. She did not hit her head or injure her neck or back. Patient is being followed by her PCP for chronic renal insufficiency, and known noncompliance with prescribed medications. She has also been seen by nephrology, but declined hemodialysis. Her PCP has consider referring her to a different group but that has not happened, yet. Patient denies nausea, vomiting, fever, chills, chest pain, focal weakness or paresthesia. There are no other known modifying factors.  HPI  Past Medical History:  Diagnosis Date  . Aortic stenosis   . Bacterial sinusitis 09/17/2011  . CKD (chronic kidney disease) stage 4, GFR 15-29 ml/min ([place]) 08/11/2006   Cr continues to increase. Proteinuria on UA 02/10/12.    . Colitis   . CVA (cerebrovascular accident) [facility])    New hemorrhagic per CT scan '09  . Diverticulosis of colon   . Dysfunctional uterine bleeding   . Fecal impaction ([place])   . Headache(784.0)   . HERNIORRHAPHY, HX OF 08/11/2006  . Hypertension   . OA (osteoarthritis)    bilateral knees  . Postmenopausal   . Pulmonary nodule   . TINEA CRURIS 01/12/2007    Patient Active Problem List   Diagnosis Date Noted  . Symptomatic anemia 02/17/2017  . Secondary hyperparathyroidism of renal origin ([place]) 01/15/2017  . Tachycardia   . Mitral valve annular calcification   . Malnutrition of moderate degree 12/09/2016  . Dizziness 12/08/2016  . Constipation 10/15/2016  . Angina at rest [facility])   . Goals of care, counseling/discussion   . Palliative care encounter   . Normal anion gap metabolic acidosis 01/01/9484  . Aortic stenosis 05/21/2016  .  Anemia in stage 5 chronic kidney disease, not on chronic dialysis ([place]) 02/01/2016  . Atherosclerosis of aorta ([place]) 01/11/2015  . CKD (chronic kidney disease) stage 5, GFR less than 15 ml/min ([facility]) 02/04/2013  . Non-intractable vomiting with nausea 08/17/2012  . Glaucoma 03/18/2012  . Health care maintenance 09/17/2011  . Osteoarthrosis involving lower leg 10/31/2008  . History of CVA (cerebrovascular accident) 01/28/2008  . Hyperlipidemia 02/13/2007  . PULMONARY NODULES 01/12/2007  . Left ventricular hypertrophy 09/02/2006  . Essential hypertension 08/11/2006  . GERD 08/11/2006  . Diverticulitis of colon 08/11/2006    Past Surgical History:  Procedure Laterality Date  . ABDOMINAL HYSTERECTOMY    . CHOLECYSTECTOMY  2009  . INGUINAL HERNIA REPAIR  2008  . IRIDOTOMY / IRIDECTOMY     Laser, right eye 12/26/11 left eye 01/24/12  . MASS EXCISION Left 05/07/2013   Procedure: EXCISION CYST;  Surgeon: Myrtha Mantis., MD;  Location: [place];  Service: Ophthalmology;  Laterality: Left;    OB History    No data available       Home Medications    Prior to Admission medications   Medication Sig Start Date End Date Taking? Authorizing Provider  acetaminophen (TYLENOL) 500 MG tablet Take 1,000 mg by mouth every 8 (eight) hours as needed for mild pain, moderate pain, fever or headache.     [provider]  aspirin EC 81 MG EC tablet  Take 1 tablet (81 mg total) by mouth daily. 12/10/16   Valinda Party, DO  calcitRIOL (ROCALTROL) 0.25 MCG capsule Take 1 capsule (0.25 mcg total) by mouth daily. 01/15/17   Sid Falcon, MD  Capsaicin-Menthol-Methyl Sal (CAPSAICIN-METHYL SAL-MENTHOL) 0.025-1-12 % CREA Apply a small amount to area of knee pain daily. 01/15/17   Sid Falcon, MD  cloNIDine (CATAPRES - DOSED IN MG/24 HR) 0.2 mg/24hr patch Place 1 patch (0.2 mg total) onto the skin every 7 (seven) days. 01/15/17 01/15/18  Sid Falcon, MD  famotidine  (PEPCID) 20 MG tablet Take 1 tablet (20 mg total) by mouth 2 (two) times daily. 01/15/17   Sid Falcon, MD  nitroGLYCERIN (NITROSTAT) 0.3 MG SL tablet Place 1 tablet (0.3 mg total) under the tongue every 5 (five) minutes as needed for chest pain. 09/22/16   Minus Liberty, MD  ondansetron (ZOFRAN-ODT) 4 MG disintegrating tablet Take 1 tablet (4 mg total) by mouth every 6 (six) hours as needed for nausea or vomiting. 01/15/17   Sid Falcon, MD  SENSIPAR 60 MG tablet Take 1 tablet (60 mg total) by mouth daily. 01/15/17   Sid Falcon, MD  sodium bicarbonate 650 MG tablet Take 1 tablet (650 mg total) by mouth 2 (two) times daily. 01/15/17 01/15/18  Sid Falcon, MD    Family History Family History  Problem Relation Age of Onset  . Hypertension Mother   . Heart attack Mother   . Heart disease Mother     Social History Social History  Substance Use Topics  . Smoking status: Never Smoker  . Smokeless tobacco: Never Used  . Alcohol use No     Allergies   Hydrocodone-acetaminophen   Review of Systems Review of Systems  All other systems reviewed and are negative.    Physical Exam Updated Vital Signs BP (!) 158/79   Pulse 98   Temp 98.4 F (36.9 C) (Oral)   Resp 14   SpO2 99%   Physical Exam  Constitutional: She is oriented to person, place, and time. She appears well-developed.  Elderly, frail  HENT:  Head: Normocephalic and atraumatic.  Eyes: Pupils are equal, round, and reactive to light. Conjunctivae and EOM are normal.  Neck: Normal range of motion and phonation normal. Neck supple.  Cardiovascular: Normal rate and regular rhythm.   Systolic murmur  Pulmonary/Chest: Effort normal and breath sounds normal. She exhibits no tenderness.  Abdominal: Soft. She exhibits no distension. There is no tenderness. There is no guarding.  Musculoskeletal: Normal range of motion. She exhibits no edema or deformity.  Right knee, somewhat tender and swollen. She resists  movement of the right knee secondary to pain. No erythema of the right knee or large knee effusion.  Neurological: She is alert and oriented to person, place, and time. She exhibits normal muscle tone.  Skin: Skin is warm and dry.  Psychiatric: She has a normal mood and affect. Her behavior is normal.  Nursing note and vitals reviewed.    ED Treatments / Results  Labs (all labs ordered are listed, but only abnormal results are displayed) Labs Reviewed  PROTIME-INR - Abnormal; Notable for the following:       Result Value   Prothrombin Time 16.3 (*)    All other components within normal limits  CBC - Abnormal; Notable for the following:    RBC 1.85 (*)    Hemoglobin 5.1 (*)    HCT 15.9 (*)    RDW 17.3 (*)  All other components within normal limits  COMPREHENSIVE METABOLIC PANEL - Abnormal; Notable for the following:    Chloride 115 (*)    CO2 11 (*)    Glucose, Bld 118 (*)    BUN 143 (*)    Creatinine, Ser 12.13 (*)    Calcium 8.3 (*)    Albumin 3.3 (*)    AST 7 (*)    ALT 6 (*)    GFR calc non Af Amer 3 (*)    GFR calc Af Amer 3 (*)    Anion gap 16 (*)    All other components within normal limits  CBG MONITORING, ED - Abnormal; Notable for the following:    Glucose-Capillary 107 (*)    All other components within normal limits  I-STAT CHEM 8, ED - Abnormal; Notable for the following:    Chloride 118 (*)    BUN 133 (*)    Creatinine, Ser 13.20 (*)    Glucose, Bld 115 (*)    Calcium, Ion 1.14 (*)    Hemoglobin 5.4 (*)    HCT 16.0 (*)    All other components within normal limits  APTT  DIFFERENTIAL  I-STAT TROPONIN, ED  TYPE AND SCREEN  PREPARE RBC (CROSSMATCH)   BUN  Date Value Ref Range Status  02/17/2017 133 (H) 6 - 20 mg/dL Final  02/17/2017 143 (H) 6 - 20 mg/dL Final  01/15/2017 109 (HH) 8 - 27 mg/dL Final  12/09/2016 104 (H) 6 - 20 mg/dL Final  12/08/2016 109 (H) 6 - 20 mg/dL Final   Creat  Date Value Ref Range Status  11/25/2014 5.79 (H) 0.50 -  1.10 mg/dL Final  02/09/2014 4.64 (H) 0.50 - 1.10 mg/dL Final  12/14/2013 4.84 (H) 0.50 - 1.10 mg/dL Final  09/08/2013 5.47 (H) 0.50 - 1.10 mg/dL Final   Creatinine, Ser  Date Value Ref Range Status  02/17/2017 13.20 (H) 0.44 - 1.00 mg/dL Final  02/17/2017 12.13 (H) 0.44 - 1.00 mg/dL Final  01/15/2017 9.52 (H) 0.57 - 1.00 mg/dL Final  12/09/2016 10.33 (H) 0.44 - 1.00 mg/dL Final   Hemoglobin  Date Value Ref Range Status  02/17/2017 5.4 (LL) 12.0 - 15.0 g/dL Final  02/17/2017 5.1 (LL) 12.0 - 15.0 g/dL Final    Comment:    REPEATED TO VERIFY CRITICAL RESULT CALLED TO, READ BACK BY AND VERIFIED WITH: W MUNNETT,RN 2023 02/17/17 D BRADLEY   01/15/2017 8.3 (L) 11.1 - 15.9 g/dL Final  12/09/2016 8.9 (L) 12.0 - 15.0 g/dL Final  12/08/2016 10.0 (L) 12.0 - 15.0 g/dL Final    EKG  EKG Interpretation None       Radiology Ct Head Wo Contrast  Result Date: 02/17/2017 CLINICAL DATA:  76 year old hypertensive female tripped and fell hitting knee. Slurred speech and right facial droop since yesterday. Prior stroke. Symptoms are new. Initial encounter. EXAM: CT HEAD WITHOUT CONTRAST TECHNIQUE: Contiguous axial images were obtained from the base of the skull through the vertex without intravenous contrast. COMPARISON:  12/08/2016 CT and MR. FINDINGS: Brain: No intracranial hemorrhage or CT evidence of large acute infarct. Moderate chronic microvascular changes. Remote left cerebellar hemorrhagic infarct. Moderate global atrophy without hydrocephalus. No intracranial mass lesion noted on this unenhanced exam. Vascular: Vascular calcifications. Skull: No skull fracture. Sinuses/Orbits: No acute orbital abnormality. Visualized paranasal sinuses are clear. Other: Mastoid air cells and middle ear cavities are clear. IMPRESSION: No acute intracranial hemorrhage or CT evidence of large acute infarct. Moderate chronic microvascular changes. Remote left cerebellar hemorrhage  acute infarct. Global atrophy.  Electronically Signed   By: Genia Del M.D.   On: 02/17/2017 19:16   Dg Knee Complete 4 Views Right  Result Date: 02/17/2017 CLINICAL DATA:  Patient fell.  Now with anterior right knee pain. EXAM: RIGHT KNEE - COMPLETE 4+ VIEW COMPARISON:  02/09/2014. FINDINGS: No fracture. No subluxation or dislocation. Prominent degenerative spurring noted in all 3 compartments. Small joint effusion evident. IMPRESSION: Tricompartmental degenerative changes without acute bony abnormality. Electronically Signed   By: Misty Stanley M.D.   On: 02/17/2017 20:26    Procedures Procedures (including critical care time)  Medications Ordered in ED Medications  0.9 %  sodium chloride infusion (not administered)     Initial Impression / Assessment and Plan / ED Course  I have reviewed the triage vital signs and the nursing notes.  Pertinent labs & imaging results that were available during my care of the patient were reviewed by me and considered in my medical decision making (see chart for details).      Patient Vitals for the past 24 hrs:  BP Temp Temp src Pulse Resp SpO2  02/17/17 2130 (!) 158/79 - - 98 14 99 %  02/17/17 1822 (!) 155/67 98.4 F (36.9 C) Oral 70 18 99 %    9:12 PM Reevaluation with update and discussion. After initial assessment and treatment, an updated evaluation reveals Patient is willing to get a blood transfusion, but not willing to get dialysis at this time. Findings discussed with patient, her sister, and the patient's daughter by phone, who was at home. All questions were answered. Graceanna Theissen L   21:30- paged PCP Service to admit patient for blood transfusion, and additional help with ongoing severe kidney disease.  CRITICAL CARE Performed by: Richarda Blade Total critical care time: 40 minutes Critical care time was exclusive of separately billable procedures and treating other patients. Critical care was necessary to treat or prevent imminent or life-threatening  deterioration. Critical care was time spent personally by me on the following activities: development of treatment plan with patient and/or surrogate as well as nursing, discussions with consultants, evaluation of patient's response to treatment, examination of patient, obtaining history from patient or surrogate, ordering and performing treatments and interventions, ordering and review of laboratory studies, ordering and review of radiographic studies, pulse oximetry and re-evaluation of patient's condition.   Final Clinical Impressions(s) / ED Diagnoses   Final diagnoses:  Anemia, unspecified type  AKI (acute kidney injury) ([place])  Noncompliance with medication regimen    Worsening renal disease, with anemia worsened baseline, likely secondary to ongoing chronic renal insufficiency. Potassium normal today. Significant uremia without altered mental status. Patient continues to refuse dialysis, and agrees to admission for help with stabilization.  Nursing Notes Reviewed/ Care Coordinated Applicable Imaging Reviewed Interpretation of Laboratory Data incorporated into ED treatment   Plan: Admit  New Prescriptions New Prescriptions   No medications on file     Daleen Bo, MD 02/17/17 2230

## 2017-02-17 NOTE — ED Notes (Signed)
ED Provider at bedside. 

## 2017-02-18 ENCOUNTER — Observation Stay (HOSPITAL_BASED_OUTPATIENT_CLINIC_OR_DEPARTMENT_OTHER): Payer: Medicare Other

## 2017-02-18 DIAGNOSIS — R630 Anorexia: Secondary | ICD-10-CM | POA: Diagnosis not present

## 2017-02-18 DIAGNOSIS — E785 Hyperlipidemia, unspecified: Secondary | ICD-10-CM | POA: Diagnosis not present

## 2017-02-18 DIAGNOSIS — M1710 Unilateral primary osteoarthritis, unspecified knee: Secondary | ICD-10-CM | POA: Diagnosis not present

## 2017-02-18 DIAGNOSIS — Z6821 Body mass index (BMI) 21.0-21.9, adult: Secondary | ICD-10-CM | POA: Diagnosis not present

## 2017-02-18 DIAGNOSIS — K219 Gastro-esophageal reflux disease without esophagitis: Secondary | ICD-10-CM | POA: Diagnosis not present

## 2017-02-18 DIAGNOSIS — Z4682 Encounter for fitting and adjustment of non-vascular catheter: Secondary | ICD-10-CM | POA: Diagnosis not present

## 2017-02-18 DIAGNOSIS — Y92009 Unspecified place in unspecified non-institutional (private) residence as the place of occurrence of the external cause: Secondary | ICD-10-CM | POA: Diagnosis not present

## 2017-02-18 DIAGNOSIS — D631 Anemia in chronic kidney disease: Secondary | ICD-10-CM | POA: Diagnosis not present

## 2017-02-18 DIAGNOSIS — Z8673 Personal history of transient ischemic attack (TIA), and cerebral infarction without residual deficits: Secondary | ICD-10-CM | POA: Diagnosis not present

## 2017-02-18 DIAGNOSIS — M1990 Unspecified osteoarthritis, unspecified site: Secondary | ICD-10-CM | POA: Diagnosis not present

## 2017-02-18 DIAGNOSIS — R634 Abnormal weight loss: Secondary | ICD-10-CM | POA: Diagnosis not present

## 2017-02-18 DIAGNOSIS — Z992 Dependence on renal dialysis: Secondary | ICD-10-CM | POA: Diagnosis not present

## 2017-02-18 DIAGNOSIS — S8391XA Sprain of unspecified site of right knee, initial encounter: Secondary | ICD-10-CM | POA: Diagnosis not present

## 2017-02-18 DIAGNOSIS — D649 Anemia, unspecified: Secondary | ICD-10-CM | POA: Diagnosis not present

## 2017-02-18 DIAGNOSIS — I120 Hypertensive chronic kidney disease with stage 5 chronic kidney disease or end stage renal disease: Secondary | ICD-10-CM | POA: Diagnosis not present

## 2017-02-18 DIAGNOSIS — E872 Acidosis: Secondary | ICD-10-CM | POA: Diagnosis not present

## 2017-02-18 DIAGNOSIS — M25461 Effusion, right knee: Secondary | ICD-10-CM | POA: Diagnosis not present

## 2017-02-18 DIAGNOSIS — N2581 Secondary hyperparathyroidism of renal origin: Secondary | ICD-10-CM | POA: Diagnosis not present

## 2017-02-18 DIAGNOSIS — N186 End stage renal disease: Secondary | ICD-10-CM

## 2017-02-18 DIAGNOSIS — N185 Chronic kidney disease, stage 5: Secondary | ICD-10-CM | POA: Diagnosis not present

## 2017-02-18 DIAGNOSIS — I350 Nonrheumatic aortic (valve) stenosis: Secondary | ICD-10-CM | POA: Diagnosis present

## 2017-02-18 DIAGNOSIS — Z9114 Patient's other noncompliance with medication regimen: Secondary | ICD-10-CM | POA: Diagnosis not present

## 2017-02-18 DIAGNOSIS — Z888 Allergy status to other drugs, medicaments and biological substances status: Secondary | ICD-10-CM | POA: Diagnosis not present

## 2017-02-18 DIAGNOSIS — N179 Acute kidney failure, unspecified: Secondary | ICD-10-CM | POA: Diagnosis not present

## 2017-02-18 DIAGNOSIS — I2510 Atherosclerotic heart disease of native coronary artery without angina pectoris: Secondary | ICD-10-CM | POA: Diagnosis present

## 2017-02-18 DIAGNOSIS — W1830XA Fall on same level, unspecified, initial encounter: Secondary | ICD-10-CM | POA: Diagnosis present

## 2017-02-18 LAB — CBC
HEMATOCRIT: 23.9 % — AB (ref 36.0–46.0)
HEMOGLOBIN: 7.9 g/dL — AB (ref 12.0–15.0)
MCH: 28.4 pg (ref 26.0–34.0)
MCHC: 33.1 g/dL (ref 30.0–36.0)
MCV: 86 fL (ref 78.0–100.0)
Platelets: 153 10*3/uL (ref 150–400)
RBC: 2.78 MIL/uL — ABNORMAL LOW (ref 3.87–5.11)
RDW: 16.7 % — AB (ref 11.5–15.5)
WBC: 9.9 10*3/uL (ref 4.0–10.5)

## 2017-02-18 MED ORDER — HYDROMORPHONE HCL 1 MG/ML IJ SOLN
0.5000 mg | INTRAMUSCULAR | Status: DC | PRN
  Administered 2017-02-22: 0.5 mg via INTRAVENOUS
  Filled 2017-02-18: qty 0.5

## 2017-02-18 MED ORDER — CLONIDINE HCL 0.2 MG/24HR TD PTWK
0.2000 mg | MEDICATED_PATCH | TRANSDERMAL | Status: DC
  Administered 2017-02-18: 0.2 mg via TRANSDERMAL
  Filled 2017-02-18: qty 1

## 2017-02-18 MED ORDER — SODIUM BICARBONATE 650 MG PO TABS: 650.0000 mg | ORAL_TABLET | Freq: Two times a day (BID) | ORAL | Status: DC

## 2017-02-18 MED ORDER — DARBEPOETIN ALFA 100 MCG/0.5ML IJ SOSY
100.0000 ug | PREFILLED_SYRINGE | Freq: Once | INTRAMUSCULAR | Status: AC
  Administered 2017-02-18: 100 ug via SUBCUTANEOUS
  Filled 2017-02-18: qty 0.5

## 2017-02-18 NOTE — Consult Note (Signed)
Patient name: Nancy [name] MRN: [id number] DOB: Jan 17, 1941 Sex: female  REASON FOR CONSULT: [place] and permanent dialysis access, consult is from Juanell Fairly NP   HPI:   Nancy [name] is a 76 y.o. female, with new ESRD in need of dialysis access. She is right handed. Has never had access procedures before. She presented to the [facility] ED today after falling at home when her right leg gave out. She reports progressive swelling in her legs. Workup revealed Hgb of 5.1, creatinine of 12 and BUN of 143. Has previously refused dialysis initiation in the past.  Has history of CVA with no residual deficits. No known cardiac issues. Has hypertension managed on norvasc, coreg and lasix.   Past Medical History:  Diagnosis Date  . Aortic stenosis   . Bacterial sinusitis 09/17/2011  . CKD (chronic kidney disease) stage 4, GFR 15-29 ml/min ([place]) 08/11/2006   Cr continues to increase. Proteinuria on UA 02/10/12.    . Colitis   . CVA (cerebrovascular accident) [facility])    New hemorrhagic per CT scan '09  . Diverticulosis of colon   . Dysfunctional uterine bleeding   . Fecal impaction ([place])   . Headache(784.0)   . HERNIORRHAPHY, HX OF 08/11/2006  . Hypertension   . OA (osteoarthritis)    bilateral knees  . Postmenopausal   . Pulmonary nodule   . TINEA CRURIS 01/12/2007    Past Surgical History:  Procedure Laterality Date  . ABDOMINAL HYSTERECTOMY    . CHOLECYSTECTOMY  2009  . INGUINAL HERNIA REPAIR  2008  . IRIDOTOMY / IRIDECTOMY     Laser, right eye 12/26/11 left eye 01/24/12  . MASS EXCISION Left 05/07/2013   Procedure: EXCISION CYST;  Surgeon: Myrtha Mantis., MD;  Location: [place];  Service: Ophthalmology;  Laterality: Left;    Family History  Problem Relation Age of Onset  . Hypertension Mother   . Heart attack Mother   . Heart disease Mother      SOCIAL HISTORY:   Social History   Social History  . Marital status: Married    Spouse name: N/A  .  Number of children: N/A  . Years of education: N/A   Occupational History  . Not on file.   Social History Main Topics  . Smoking status: Never Smoker  . Smokeless tobacco: Never Used  . Alcohol use No  . Drug use: No     Comment: 08/15/08 UDS + cocaine  . Sexual activity: Not Currently   Other Topics Concern  . Not on file   Social History Narrative   Married, lives with her husband. 1 child.           Allergies  Allergen Reactions  . Hydrocodone-Acetaminophen Nausea And Vomiting and Other (See Comments)    Dizziness (also)    MEDICATIONS:    Current Facility-Administered Medications  Medication Dose Route Frequency Provider Last Rate Last Dose  . 0.9 %  sodium chloride infusion   Intravenous Once Daleen Bo, MD      . acetaminophen (TYLENOL) tablet 1,000 mg  1,000 mg Oral Q8H PRN Collier Salina, MD      . aspirin EC tablet 81 mg  81 mg Oral Daily Collier Salina, MD   81 mg at 02/18/17 1114  . calcitRIOL (ROCALTROL) capsule 0.25 mcg  0.25 mcg Oral Daily Collier Salina, MD   0.25 mcg at 02/18/17 1113  . cinacalcet (SENSIPAR) tablet 60 mg  60 mg Oral Q breakfast Collier Salina, MD   60 mg at 02/18/17 0809  . cloNIDine (CATAPRES - Dosed in mg/24 hr) patch 0.2 mg  0.2 mg Transdermal Weekly [place], Hulen Shouts, MD      . Darbepoetin Alfa (ARANESP) injection 100 mcg  100 mcg Subcutaneous Once Melanee Spry, MD      . famotidine (PEPCID) tablet 20 mg  20 mg Oral BID Collier Salina, MD   20 mg at 02/18/17 1114  . heparin injection 5,000 Units  5,000 Units Subcutaneous Q8H Collier Salina, MD   5,000 Units at 02/18/17 1406  . HYDROmorphone (DILAUDID) injection 0.5 mg  0.5 mg Intravenous Q2H PRN Collier Salina, MD      . nitroGLYCERIN (NITROSTAT) SL tablet 0.4 mg  0.4 mg Sublingual Q5 min PRN Collier Salina, MD   0.4 mg at 02/18/17 0434  . ondansetron (ZOFRAN-ODT) disintegrating tablet 4 mg  4 mg Oral Q6H PRN Collier Salina, MD    4 mg at 02/18/17 1039  . senna-docusate (Senokot-S) tablet 1 tablet  1 tablet Oral QHS PRN Collier Salina, MD      . sodium bicarbonate tablet 650 mg  650 mg Oral BID Collier Salina, MD   650 mg at 02/18/17 1114  . traMADol (ULTRAM) tablet 50 mg  50 mg Oral Q12H PRN Collier Salina, MD   50 mg at 02/18/17 0110    REVIEW OF SYSTEMS:    REVIEW OF SYSTEMS (negative unless checked):   Cardiac:  []  Chest pain or chest pressure? []  Shortness of breath upon activity? []  Shortness of breath when lying flat? []  Irregular heart rhythm?  Vascular:  []  Pain in calf, thigh, or hip brought on by walking? []  Pain in feet at night that wakes you up from your sleep? []  Blood clot in your veins? [x]  Leg swelling?  Pulmonary:  []  Oxygen at home? []  Productive cough? []  Wheezing?  Neurologic:  []  Sudden weakness in arms or legs? []  Sudden numbness in arms or legs? []  Sudden onset of difficult speaking or slurred speech? []  Temporary loss of vision in one eye? []  Problems with dizziness?  Gastrointestinal:  []  Blood in stool? []  Vomited blood?  Genitourinary:  []  Burning when urinating? []  Blood in urine?  Psychiatric:  []  Major depression  Hematologic:  []  Bleeding problems? []  Problems with blood clotting?  Dermatologic:  []  Rashes or ulcers?  Constitutional:  []  Fever or chills?  Ear/Nose/Throat:  []  Change in hearing? []  Nose bleeds? []  Sore throat?  Musculoskeletal:  []  Back pain? []  Joint pain? []  Muscle pain?   PHYSICAL EXAM:    Vitals:   02/18/17 0330 02/18/17 0410 02/18/17 0801 02/18/17 0901  BP: (!) 144/61 140/64 (!) 146/77 (!) 163/78  Pulse: 95 98 98 99  Resp: 18 20 17 18   Temp: 98.6 F (37 C) 97.8 F (36.6 C) 98 F (36.7 C) 98.6 F (37 C)  TempSrc: Oral Oral Oral Oral  SpO2:   100% 100%  Weight:      Height:        GENERAL: The patient is a well-nourished female, in no acute distress. The vital signs are documented  above. HEENT: normocephalic, atraumatic, no abnormalities noted.  CARDIAC: There is a regular rate and rhythm. Systolic ejection murmur. No carotid bruits: transmitted SEM.  VASCULAR: 2+ radial and brachial pulses bilaterally. Non palpable pedal pulses but feet are warm bilaterally.  PULMONARY: There is good  air exchange bilaterally without wheezing or rales. ABDOMEN: Soft and non-tender with normal pitched bowel sounds.  MUSCULOSKELETAL: Bilateral lower extremity edema 1+ NEUROLOGIC: No focal weakness or paresthesias are detected. SKIN: There are no ulcers or rashes noted. PSYCHIATRIC: The patient has a normal affect. NECK: Supple, no nuchal rigidity, no palpable LAD LYMPH:  No Cervical, Axillary, or Inguinal lymphadenopathy    Radiology     Ct Head Wo Contrast  Result Date: 02/17/2017 CLINICAL DATA:  76 year old hypertensive female tripped and fell hitting knee. Slurred speech and right facial droop since yesterday. Prior stroke. Symptoms are new. Initial encounter. EXAM: CT HEAD WITHOUT CONTRAST TECHNIQUE: Contiguous axial images were obtained from the base of the skull through the vertex without intravenous contrast. COMPARISON:  12/08/2016 CT and MR. FINDINGS: Brain: No intracranial hemorrhage or CT evidence of large acute infarct. Moderate chronic microvascular changes. Remote left cerebellar hemorrhagic infarct. Moderate global atrophy without hydrocephalus. No intracranial mass lesion noted on this unenhanced exam. Vascular: Vascular calcifications. Skull: No skull fracture. Sinuses/Orbits: No acute orbital abnormality. Visualized paranasal sinuses are clear. Other: Mastoid air cells and middle ear cavities are clear. IMPRESSION: No acute intracranial hemorrhage or CT evidence of large acute infarct. Moderate chronic microvascular changes. Remote left cerebellar hemorrhage acute infarct. Global atrophy. Electronically Signed   By: Genia Del M.D.   On: 02/17/2017 19:16   Dg Knee  Complete 4 Views Right  Result Date: 02/17/2017 CLINICAL DATA:  Patient fell.  Now with anterior right knee pain. EXAM: RIGHT KNEE - COMPLETE 4+ VIEW COMPARISON:  02/09/2014. FINDINGS: No fracture. No subluxation or dislocation. Prominent degenerative spurring noted in all 3 compartments. Small joint effusion evident. IMPRESSION: Tricompartmental degenerative changes without acute bony abnormality. Electronically Signed   By: Misty Stanley M.D.   On: 02/17/2017 20:26    Laboratory   CBC CBC Latest Ref Rng & Units 02/18/2017 02/17/2017 02/17/2017  WBC 4.0 - 10.5 K/uL 9.9 - 6.3  Hemoglobin 12.0 - 15.0 g/dL 7.9(L) 5.4(LL) 5.1(LL)  Hematocrit 36.0 - 46.0 % 23.9(L) 16.0(L) 15.9(L)  Platelets 150 - 400 K/uL 153 - 152    BMP BMP Latest Ref Rng & Units 02/17/2017 02/17/2017 01/15/2017  Glucose 65 - 99 mg/dL 115(H) 118(H) -  BUN 6 - 20 mg/dL 133(H) 143(H) -  Creatinine 0.44 - 1.00 mg/dL 13.20(H) 12.13(H) -  BUN/Creat Ratio 12 - 28 - - -  Sodium 135 - 145 mmol/L 143 142 -  Potassium 3.5 - 5.1 mmol/L 4.8 4.9 -  Chloride 101 - 111 mmol/L 118(H) 115(H) -  CO2 22 - 32 mmol/L - 11(L) -  Calcium 8.9 - 10.3 mg/dL - 8.3(L) 8.1(L)    Coagulation Lab Results  Component Value Date   INR 1.30 02/17/2017   INR 1.14 09/21/2016   INR 1.06 07/13/2012   No results found for: PTT  Lipids    Component Value Date/Time   CHOL 109 12/09/2016 0129   TRIG 110 12/09/2016 0129   HDL 37 (L) 12/09/2016 0129   CHOLHDL 2.9 12/09/2016 0129   VLDL 22 12/09/2016 0129   LDLCALC 50 12/09/2016 0129    ASSESSMENT/PLAN:     New ESRD  If patient is agreeable to HD, will plan for [facility] placement tomorrow. Will go ahead and schedule TDC.  Unsure if vein mapping can be obtained today. If not, will plan for fistula vs graft in near future.   Virgina Jock, PA-C [facility] [place] [phone]   Addendum  I have independently  interviewed and examined the patient, and I agree with the physician  assistant's findings.  Pt is RHD.  Vein mapping pending.  Renal requests TDC placement to start HD.  Will place [facility] tomorrow.  With low H/H and transfusion in progress, will need to see if medically stable for L AVF vs AVG placement.   Adele Barthel, MD, FACS [facility] [place] Office: [phone] Pager: [phone]  02/18/2017, 3:40 PM

## 2017-02-18 NOTE — Progress Notes (Signed)
   Subjective: Patient seen and examined this morning. She denied having any symptoms of anemia including shortness of breath, dizziness, light headedness, or chest pain prior to admission or in the hospital. We discussed with the patient that her kidney function is very poor and needs to highly consider dialysis treatment. She stated she will think about it. Otherwise she has no other complaints at this time except for the knee pain that brought her to the ED.   Objective:  Vital signs in last 24 hours: Vitals:   02/18/17 0330 02/18/17 0410 02/18/17 0801 02/18/17 0901  BP: (!) 144/61 140/64 (!) 146/77 (!) 163/78  Pulse: 95 98 98 99  Resp: 18 20 17 18   Temp: 98.6 F (37 C) 97.8 F (36.6 C) 98 F (36.7 C) 98.6 F (37 C)  TempSrc: Oral Oral Oral Oral  SpO2:   100% 100%  Weight:      Height:       General: laying in bed comfortably, NAD HEENT: [place]/AT, EOMI, no scleral icterus Cardiac: RRR, 5/6 systolic murmur  Pulm: normal effort Abd: soft, non tender, non distended, BS normal Ext: warm and well perfused, no pedal edema Neuro: alert and oriented X3, cranial nerves II-XII grossly intact   Assessment/Plan:  Principal Problem:   Symptomatic anemia Active Problems:   Essential hypertension   History of CVA (cerebrovascular accident)   GERD   Osteoarthrosis involving lower leg   CKD (chronic kidney disease) stage 5, GFR less than 15 ml/min (HCC) Symptomatic Anemia, CKD 5 Most likely secondary to anemia of Chronic Kidney Disease, Creatinine 13.20, BUN 133, GFR 3. Patient has refused dialysis thus far, not take renal protective medications at home, and has not received any epo treatments. Discussed with patient the severity of her renal disease and the importance of dialysis. Received two units of PRBCs. Troponin mildly elevated at 0.02, patient is asymptomatic and denied chest pain.  -Follow up hemoglobin post transfusion -Consulted nephrology, appreciate recs -Restarted calcitriol,  sensipar -Aranesp 100 mcg given today -BMET, CBC in AM  Metabolic acidosis secondary to end-stage renal disease Patient is on oral bicarb at home but is not taking -Restarted sodium bicarb 650 mg BID  HTN BP 145-160/64-78 -Continue Clonidine patch  R knee pain Most likely a sprain, imaging negative for acute bony abnormality -Tramadol for pain -ice pack/heat  Dispo: Anticipated discharge in approximately 1-2 day(s).   Melanee Spry, MD 02/18/2017, 2:08 PM Pager: [phone]

## 2017-02-18 NOTE — H&P (Signed)
Date: 02/18/2017               Patient Name:  Nancy [name] MRN: [id number]  DOB: Jan 04, 1941 Age / Sex: 76 y.o., female   PCP: Sid Falcon, MD         Medical Service: Internal Medicine Teaching Service         Attending Physician: Dr. Annia Belt, MD    First Contact: Dr. Tawny Asal Pager: [phone]  Second Contact: Dr. Jule Ser Pager: [phone]       After Hours (After 5p/  First Contact Pager: [phone]  weekends / holidays): Second Contact Pager: [phone]   Chief Complaint: R knee pain   History of Present Illness: 76 yo female with PMH of CVA hemorrhagic, Aortic Stenosis, CKD stage 5 not on dialysis, diverticulosis, HTN, OA,  CAD, presents with right knee pain after a fall yesterday morning, pt denies precipitating dizziness, or syncope but endorses that she had weakness in the right leg before the fall stating "my leg gave out while I was headed to the bathroom".  She denies hitting her head.  She reports progressive weight loss, decreased appetite, decreased fluid intake but denies shortness of breath, nausea/vomiting, dysuria, hematuria, bloody bowel movements, lower extremity edema.  She has been having intermittent substernal chest pain which is not new that is relieved with her home nitroglycerin. She admits to not taking any of her other home medications other than her clonidine because she feels like they aren't doing anything for her.  ED course: Pt arrived for R knee pain,  X rays of knee were taken showing degenerative changes with no acute abnormality, a CT head was ordered and shows no acute large infarcts or intracranial hemorrhage, moderate global atrophy,  Moderate chronic microvascular changes, remote left cerebellar hemorrhage acute infarct.  Hgb also found to be 5.1, BUN 143, Cr 12.13 albumin 3.3 pt 16.3, PTT normal, troponin neg, ecg sinus rhythm Pt typed and screened, transfused 2 units,     Meds:  No outpatient prescriptions have been  marked as taking for the 02/17/17 encounter [facility] Encounter).     Allergies: Allergies as of 02/17/2017 - Review Complete 02/17/2017  Allergen Reaction Noted  . Hydrocodone-acetaminophen Nausea And Vomiting and Other (See Comments)    Past Medical History:  Diagnosis Date  . Aortic stenosis   . Bacterial sinusitis 09/17/2011  . CKD (chronic kidney disease) stage 4, GFR 15-29 ml/min ([place]) 08/11/2006   Cr continues to increase. Proteinuria on UA 02/10/12.    . Colitis   . CVA (cerebrovascular accident) [facility])    New hemorrhagic per CT scan '09  . Diverticulosis of colon   . Dysfunctional uterine bleeding   . Fecal impaction ([place])   . Headache(784.0)   . HERNIORRHAPHY, HX OF 08/11/2006  . Hypertension   . OA (osteoarthritis)    bilateral knees  . Postmenopausal   . Pulmonary nodule   . TINEA CRURIS 01/12/2007    Family History:  Family History  Problem Relation Age of Onset  . Hypertension Mother   . Heart attack Mother   . Heart disease Mother     Social History:  Social History   Social History  . Marital status: Married    Spouse name: N/A  . Number of children: N/A  . Years of education: N/A   Occupational History  . Not on file.   Social History Main Topics  . Smoking status: Never Smoker  . Smokeless  tobacco: Never Used  . Alcohol use No  . Drug use: No     Comment: 08/15/08 UDS + cocaine  . Sexual activity: Not Currently   Other Topics Concern  . Not on file   Social History Narrative   Married, lives with her husband. 1 child.           Review of Systems: A complete ROS was negative except as per HPI.   Physical Exam: Blood pressure (!) 148/64, pulse (!) 101, temperature 98.4 F (36.9 C), temperature source Tympanic, resp. rate 16, weight 143 lb 4.8 oz (65 kg), SpO2 100 %. Physical Exam  Constitutional: She is oriented to person, place, and time. She appears well-developed. No distress.  HENT:  Head: Normocephalic and atraumatic.  Eyes: Right  eye exhibits no discharge. Left eye exhibits no discharge. No scleral icterus.  pallor  Cardiovascular: Regular rhythm.  Tachycardia present.   Murmur heard.  Systolic murmur is present with a grade of 3/6  Pulmonary/Chest: Effort normal and breath sounds normal.  Abdominal: Soft. Bowel sounds are normal. She exhibits no distension. There is no tenderness.  Musculoskeletal:       Right knee: She exhibits swelling. Tenderness found.  Neurological: She is alert and oriented to person, place, and time.  Skin: She is not diaphoretic. No erythema. No pallor.  Psychiatric: She has a normal mood and affect. Her behavior is normal.  Bilateral lower extremity edema non pitting  EKG: personally reviewed my interpretation is sinus rhythm, normal axis, normal intervals  CXR: personally reviewed my interpretation is n/a  Assessment & Plan by Problem: Active Problems:   Symptomatic anemia  Anemia of Chronic Kidney Disease: pt does not take renal protective medications at home, has refused dialysis so far, has not received any epo treatments, anemia has likely progressed pt reports no abnormal bowel movements or hematuria, hemoptysis or hematemesis  -FOBT not taken yet consider performing -Pt received two units will recheck hgb this am for response -continue discussion about whether pt wants to start dialysis or not, consult nephrology if necessary -restart home meds pt not taking at home calcitriol, sensipar, bicarb -repeat troponin  HTN: pt is taking her clonidine patch at home  -continue clonidine patch  R knee pain: most likely a sprain, imaging negative for acute bony abnormality  -tramadol for pain -ice pack/heat  Dispo: Admit patient to Inpatient with expected length of stay greater than 2 midnights.  Signed: Katherine Roan, MD 02/18/2017, 3:06 AM

## 2017-02-18 NOTE — Progress Notes (Signed)
Evaluated pt due to chest pain while receiving her second unit of PRBC's, pt in no acute distress complaining of substernal chest pain described as a pressure on her chest.  Repeat ECG grossly unchanged from previous, pts first troponin <0.03.  Pt denies back pain, fever, flank pain, shortness of breath or itching.  Her skin was not flush, she did not appear overtly uncomfortable despite endorsing pain.  Pt was given nitro tab that she takes for angina at home.  She reported swift relief of chest pain after taking nitro tab.  We will continue second unit of blood as pt not having a tranfusion reaction and chest pain has resolved.  With such a low hemoglobin once pts volume status equilabrates, she should have less demand ischemia due to increased RBCs.  Infusing blood at lowest rate possible, pt reports good urine output at this time, lungs were clear, vitals normal, blood pressure unchanged with mild hypertension, pt does not appear fluid overloaded at this time but pt likely will not tolerate excessive increases in afterload as pt has moderate aortic stenosis, mild aortic regurg and mild mitral stenosis, will continue to monitor and add on IV lasix if needed.

## 2017-02-18 NOTE — Care Management Obs Status (Signed)
[place] NOTIFICATION   Patient Details  Name: [place] B Garlick MRN: [id number] Date of Birth: Nov 05, 1940   Medicare Observation Status Notification Given:  Yes    Kameria Canizares, Rory Percy, RN 02/18/2017, 4:12 PM

## 2017-02-18 NOTE — Consult Note (Signed)
Reason for Consult: progressive renal failure Referring Physician: Dr. Granfortuna  Nancy [name] is an 76 y.o. female.  HPI: Pt is a 76yo AAF with PMH significant for HTN, CVA, DJD, and advanced CKD stage 5 but has declined RRT or hospice services.  She was seen by Dr. Mercy Moore in April with BUN/Cr of over 90/9 but again declined vascular access placement, dialysis education, or hospice services.  She was admitted with weakness and right knee pain but was found to have an Hgb of 5.1 and BUN/Cr of 143/12.13.  We were consulted to discuss renal replacement therapy vs. Hospice services with Nancy [name].    She denies any N/V/anorexia, however a family member at her bedside reports that she has been have nausea and poor po intake.   Trend in Creatinine:  Creatinine, Ser  Date/Time Value Ref Range Status  02/17/2017 07:01 PM 13.20 (H) 0.44 - 1.00 mg/dL Final  02/17/2017 06:32 PM 12.13 (H) 0.44 - 1.00 mg/dL Final  01/15/2017 10:55 AM 9.52 (H) 0.57 - 1.00 mg/dL Final  12/09/2016 01:29 AM 10.33 (H) 0.44 - 1.00 mg/dL Final  12/08/2016 08:45 AM 10.66 (H) 0.44 - 1.00 mg/dL Final  09/22/2016 02:44 AM 8.49 (H) 0.44 - 1.00 mg/dL Final  09/21/2016 08:40 AM 9.80 (H) 0.44 - 1.00 mg/dL Final  09/21/2016 08:30 AM 8.66 (H) 0.44 - 1.00 mg/dL Final  06/21/2016 12:00 PM 9.00 (H) 0.44 - 1.00 mg/dL Final  05/22/2016 05:36 AM 8.40 (H) 0.44 - 1.00 mg/dL Final  05/21/2016 12:46 PM 8.18 (H) 0.44 - 1.00 mg/dL Final  12/06/2015 09:09 AM 7.88 (H) 0.44 - 1.00 mg/dL Final  10/25/2015 08:45 AM 6.68 (H) 0.44 - 1.00 mg/dL Final  11/18/2014 03:00 AM 5.99 (H) 0.44 - 1.00 mg/dL Final  11/17/2014 11:03 AM 6.03 (H) 0.44 - 1.00 mg/dL Final  08/27/2013 03:50 AM 4.28 (H) 0.50 - 1.10 mg/dL Final  08/25/2013 09:30 AM 4.05 (H) 0.50 - 1.10 mg/dL Final  08/22/2013 10:08 AM 4.41 (H) 0.50 - 1.10 mg/dL Final  08/15/2013 09:40 AM 4.03 (H) 0.50 - 1.10 mg/dL Final  07/26/2013 03:29 PM 4.60 (H) 0.50 - 1.10 mg/dL Final  07/20/2013 02:09  PM 5.27 (H) 0.50 - 1.10 mg/dL Final  07/13/2013 07:10 PM 4.21 (H) 0.50 - 1.10 mg/dL Final  02/06/2013 04:50 AM 4.70 (H) 0.50 - 1.10 mg/dL Final  02/04/2013 02:04 PM 3.84 (H) 0.50 - 1.10 mg/dL Final  08/18/2012 04:40 AM 3.94 (H) 0.50 - 1.10 mg/dL Final  08/17/2012 11:14 AM 3.88 (H) 0.50 - 1.10 mg/dL Final  07/13/2012 06:42 AM 3.33 (H) 0.50 - 1.10 mg/dL Final  07/12/2012 09:20 PM 3.45 (H) 0.50 - 1.10 mg/dL Final  07/12/2012 03:19 AM 3.54 (H) 0.50 - 1.10 mg/dL Final  05/14/2010 09:43 PM 2.96 (H) (0.40-1.20 mg/dL Final  09/21/2009 08:31 PM 2.39 (H) (0.40-1.20 mg/dL Final  09/06/2008 09:21 PM 2.08 (H) 0.40 - 1.20 mg/dL Final  08/18/2008 05:28 AM 2.06 (H) 0.4 - 1.2 mg/dL Final  08/17/2008 05:39 AM 2.07 (H) 0.4 - 1.2 mg/dL Final  08/16/2008 06:02 AM 2.17 (H) 0.4 - 1.2 mg/dL Final  08/15/2008 08:45 AM 2.31 (H) 0.4 - 1.2 mg/dL Final  02/29/2008 09:45 AM 1.94 (H) 0.40 - 1.20 mg/dL Final  02/11/2008 02:46 PM 1.69 (H) 0.40 - 1.20 mg/dL Final  01/28/2008 09:15 PM 1.47 (H) 0.40 - 1.20 mg/dL Final  01/12/2008 06:40 AM 1.44 (H)  Final  01/10/2008 10:26 PM 1.68 (H)  Final  12/28/2007 06:05 AM 1.69 (H)  Final  12/27/2007 04:00  AM 1.76 (H)  Final  12/26/2007 08:30 AM 2.02 (H)  Final  12/25/2007 01:44 PM 1.49 (H)  Final  12/21/2007 12:52 PM 1.8 (H)  Final  12/13/2007 03:45 AM 1.94 (H)  Final  12/12/2007 08:27 AM 2.1 (H)  Final  12/11/2007 09:00 AM 2.01 (H)  Final  11/04/2007 06:45 AM 1.89 (H)  Final  11/03/2007 12:45 PM 2.01 (H)  Final  11/02/2007 06:40 AM 1.90 (H)  Final    PMH:   Past Medical History:  Diagnosis Date  . Aortic stenosis   . Bacterial sinusitis 09/17/2011  . CKD (chronic kidney disease) stage 4, GFR 15-29 ml/min ([place]) 08/11/2006   Cr continues to increase. Proteinuria on UA 02/10/12.    . Colitis   . CVA (cerebrovascular accident) [facility])    New hemorrhagic per CT scan '09  . Diverticulosis of colon   . Dysfunctional uterine bleeding   . Fecal impaction ([place])   . Headache(784.0)    . HERNIORRHAPHY, HX OF 08/11/2006  . Hypertension   . OA (osteoarthritis)    bilateral knees  . Postmenopausal   . Pulmonary nodule   . TINEA CRURIS 01/12/2007    PSH:   Past Surgical History:  Procedure Laterality Date  . ABDOMINAL HYSTERECTOMY    . CHOLECYSTECTOMY  2009  . INGUINAL HERNIA REPAIR  2008  . IRIDOTOMY / IRIDECTOMY     Laser, right eye 12/26/11 left eye 01/24/12  . MASS EXCISION Left 05/07/2013   Procedure: EXCISION CYST;  Surgeon: Myrtha Mantis., MD;  Location: [facility];  Service: Ophthalmology;  Laterality: Left;    Allergies:  Allergies  Allergen Reactions  . Hydrocodone-Acetaminophen Nausea And Vomiting and Other (See Comments)    Dizziness (also)    Medications:   Prior to Admission medications   Medication Sig Start Date End Date Taking? Authorizing Provider  amLODipine (NORVASC) 10 MG tablet Take 10 mg by mouth daily. 01/23/17  Yes [provider]  aspirin EC 81 MG EC tablet Take 1 tablet (81 mg total) by mouth daily. 12/10/16  Yes Hoffman, Jessica Ratliff, DO  calcitRIOL (ROCALTROL) 0.25 MCG capsule Take 1 capsule (0.25 mcg total) by mouth daily. 01/15/17  Yes Sid Falcon, MD  calcium acetate (PHOSLO) 667 MG capsule Take 2,001 mg by mouth 3 (three) times daily.  02/10/17  Yes [provider]  Capsaicin-Menthol-Methyl Sal (CAPSAICIN-METHYL SAL-MENTHOL) 0.025-1-12 % CREA Apply a small amount to area of knee pain daily. 01/15/17  Yes Sid Falcon, MD  carvedilol (COREG) 12.5 MG tablet Take 12.5 mg by mouth 2 (two) times daily. 02/17/17  Yes [provider]  cloNIDine (CATAPRES - DOSED IN MG/24 HR) 0.2 mg/24hr patch Place 1 patch (0.2 mg total) onto the skin every 7 (seven) days. 01/15/17 01/15/18 Yes Sid Falcon, MD  famotidine (PEPCID) 20 MG tablet Take 1 tablet (20 mg total) by mouth 2 (two) times daily. 01/15/17  Yes Sid Falcon, MD  furosemide (LASIX) 40 MG tablet Take 40 mg by mouth daily. 02/10/17  Yes  [provider]  nitroGLYCERIN (NITROSTAT) 0.3 MG SL tablet Place 1 tablet (0.3 mg total) under the tongue every 5 (five) minutes as needed for chest pain. 09/22/16  Yes Minus Liberty, MD  ondansetron (ZOFRAN-ODT) 4 MG disintegrating tablet Take 1 tablet (4 mg total) by mouth every 6 (six) hours as needed for nausea or vomiting. 01/15/17  Yes Sid Falcon, MD  SENSIPAR 60 MG tablet Take 1 tablet (60  mg total) by mouth daily. 01/15/17  Yes Sid Falcon, MD  sevelamer carbonate (RENVELA) 800 MG tablet Take 800 mg by mouth 3 (three) times daily. 02/10/17  Yes [provider]  sodium bicarbonate 650 MG tablet Take 1 tablet (650 mg total) by mouth 2 (two) times daily. 01/15/17 01/15/18 Yes Sid Falcon, MD  acetaminophen (TYLENOL) 500 MG tablet Take 1,000 mg by mouth every 8 (eight) hours as needed for mild pain, moderate pain, fever or headache.     [provider]    Inpatient medications: . aspirin EC  81 mg Oral Daily  . calcitRIOL  0.25 mcg Oral Daily  . cinacalcet  60 mg Oral Q breakfast  . famotidine  20 mg Oral BID  . heparin  5,000 Units Subcutaneous Q8H  . sodium bicarbonate  650 mg Oral BID    Discontinued Meds:  There are no discontinued medications.  Social History:  reports that she has never smoked. She has never used smokeless tobacco. She reports that she does not drink alcohol or use drugs.  Family History:   Family History  Problem Relation Age of Onset  . Hypertension Mother   . Heart attack Mother   . Heart disease Mother     Pertinent items are noted in HPI. Weight change:   Intake/Output Summary (Last 24 hours) at 02/18/17 1434 Last data filed at 02/18/17 0936  Gross per 24 hour  Intake              908 ml  Output                0 ml  Net              908 ml   BP (!) 163/78 (BP Location: Left Arm)   Pulse 99   Temp 98.6 F (37 C) (Oral)   Resp 18   Ht 5\' 3"  (1.6 m)   Wt 65 kg (143 lb 4.8 oz)   SpO2 100%   BMI 25.38  kg/m  Vitals:   02/18/17 0330 02/18/17 0410 02/18/17 0801 02/18/17 0901  BP: (!) 144/61 140/64 (!) 146/77 (!) 163/78  Pulse: 95 98 98 99  Resp: 18 20 17 18   Temp: 98.6 F (37 C) 97.8 F (36.6 C) 98 F (36.7 C) 98.6 F (37 C)  TempSrc: Oral Oral Oral Oral  SpO2:   100% 100%  Weight:      Height:         General appearance: fatigued, no distress and slowed mentation Head: Normocephalic, without obvious abnormality, atraumatic Eyes: negative findings: lids and lashes normal, conjunctivae and sclerae normal and corneas clear Neck: no adenopathy, no carotid bruit, no JVD, supple, symmetrical, trachea midline and thyroid not enlarged, symmetric, no tenderness/mass/nodules Resp: rhonchi bilaterally Cardio: RRR, III/VI SEM around precordium along with friction rub heard around precordium GI: soft, non-tender; bowel sounds normal; no masses,  no organomegaly Extremities: extremities normal, atraumatic, no cyanosis or edema Neuro: + asterixis  Labs: Basic Metabolic Panel:  Recent Labs Lab 02/17/17 1832 02/17/17 1901  NA 142 143  K 4.9 4.8  CL 115* 118*  CO2 11*  --   GLUCOSE 118* 115*  BUN 143* 133*  CREATININE 12.13* 13.20*  ALBUMIN 3.3*  --   CALCIUM 8.3*  --    Liver Function Tests:  Recent Labs Lab 02/17/17 1832  AST 7*  ALT 6*  ALKPHOS 57  BILITOT 0.7  PROT 6.8  ALBUMIN 3.3*  No results for input(s): LIPASE, AMYLASE in the last 168 hours. No results for input(s): AMMONIA in the last 168 hours. CBC:  Recent Labs Lab 02/17/17 1832 02/17/17 1901  WBC 6.3  --   NEUTROABS 5.2  --   HGB 5.1* 5.4*  HCT 15.9* 16.0*  MCV 85.9  --   PLT 152  --    PT/INR: @LABRCNTIP (inr:5) Cardiac Enzymes: )No results for input(s): CKTOTAL, CKMB, CKMBINDEX, TROPONINI in the last 168 hours. CBG:  Recent Labs Lab 02/17/17 1834  GLUCAP 107*    Iron Studies: No results for input(s): IRON, TIBC, TRANSFERRIN, FERRITIN in the last 168 hours.  Xrays/Other Studies: Ct  Head Wo Contrast  Result Date: 02/17/2017 CLINICAL DATA:  76 year old hypertensive female tripped and fell hitting knee. Slurred speech and right facial droop since yesterday. Prior stroke. Symptoms are new. Initial encounter. EXAM: CT HEAD WITHOUT CONTRAST TECHNIQUE: Contiguous axial images were obtained from the base of the skull through the vertex without intravenous contrast. COMPARISON:  12/08/2016 CT and MR. FINDINGS: Brain: No intracranial hemorrhage or CT evidence of large acute infarct. Moderate chronic microvascular changes. Remote left cerebellar hemorrhagic infarct. Moderate global atrophy without hydrocephalus. No intracranial mass lesion noted on this unenhanced exam. Vascular: Vascular calcifications. Skull: No skull fracture. Sinuses/Orbits: No acute orbital abnormality. Visualized paranasal sinuses are clear. Other: Mastoid air cells and middle ear cavities are clear. IMPRESSION: No acute intracranial hemorrhage or CT evidence of large acute infarct. Moderate chronic microvascular changes. Remote left cerebellar hemorrhage acute infarct. Global atrophy. Electronically Signed   By: Genia Del M.D.   On: 02/17/2017 19:16   Dg Knee Complete 4 Views Right  Result Date: 02/17/2017 CLINICAL DATA:  Patient fell.  Now with anterior right knee pain. EXAM: RIGHT KNEE - COMPLETE 4+ VIEW COMPARISON:  02/09/2014. FINDINGS: No fracture. No subluxation or dislocation. Prominent degenerative spurring noted in all 3 compartments. Small joint effusion evident. IMPRESSION: Tricompartmental degenerative changes without acute bony abnormality. Electronically Signed   By: Misty Stanley M.D.   On: 02/17/2017 20:26     Assessment/Plan: 1.  End stage renal disease with uremia- pt with asterixis, anorexia, profound anemia, and intermittent pericardial friction rub.  I had a frank conversation with Nancy [name] regarding dialysis vs dying at home with hospice.  She is now amenable to proceed with HD and  understands that she has end stage renal disease.  Will consult [facility] for tunneled HD catheter and vein mapping for AVF/AVG.  Plan for first HD tomorrow 2. Anemia of chronic kidney disease- s/p blood transufion and will start ESA with HD. 3. HTN- stable 4. Right knee sprain 5. Secondary HPTH/Bone mineral metabolism- will check ca/phos/iPTH and restart sensipar, calcitriol, and renvela 6. Nutrition- will need renal diet and dietician education 7. Vascular access- per [facility]   Donetta Potts 02/18/2017, 2:34 PM

## 2017-02-18 NOTE — Progress Notes (Signed)
Upper Extremity Vein Map  Right Cephalic  Segment Diameter Depth Comment  1. Axilla 2.64mm 7.60mm   2. Mid upper arm 2.68mm 7.34mm   3. Above [facility] 1.19mm 4.69mm   4. In [facility] 3.108mm 2.92mm   5. Below AC   Unable to visualize  6. Mid forearm   Unable to visualize  7. Wrist   Unable to visualize                  Right Basilic  Segment Diameter Depth Comment  1. Axilla   Unable to visualize  2. Mid upper arm   Unable to visualize  3. Above [facility] 5.21mm 8.37mm Thrombosed  4. In [facility] 2.28mm 1.68mm Thrombosed  5. Below AC   Unable to visualize  6. Mid forearm   Unable to visualize  7. Wrist   Unable to visualize                   Left Cephalic  Segment Diameter Depth Comment  1. Axilla   Unable to visualize  2. Mid upper arm 1.17mm 4.33mm Branch  3. Above AC   Unable to visualize  4. In [facility]   Unable to visualize  5. Below AC   Unable to visualize  6. Mid forearm   Unable to visualize  7. Wrist   Unable to visualize                  Left Basilic Unable to identify.  02/18/2017 3:45 PM Maudry Mayhew, BS, RVT, RDCS, RDMS

## 2017-02-18 NOTE — Anesthesia Preprocedure Evaluation (Signed)
Anesthesia Evaluation  Patient identified by MRN, date of birth, ID band Patient awake    Reviewed: Allergy & Precautions, NPO status , Patient's Chart, lab work & pertinent test results  Airway Mallampati: II  TM Distance: >3 FB Neck ROM: Full    Dental no notable dental hx.    Pulmonary neg pulmonary ROS,    Pulmonary exam normal breath sounds clear to auscultation       Cardiovascular hypertension, + angina + Peripheral Vascular Disease  negative cardio ROS Normal cardiovascular exam Rhythm:Regular Rate:Normal     Neuro/Psych  Headaches, CVA negative neurological ROS  negative psych ROS   GI/Hepatic negative GI ROS, Neg liver ROS, GERD  ,  Endo/Other  negative endocrine ROS  Renal/GU ESRFRenal diseasenegative Renal ROS  negative genitourinary   Musculoskeletal negative musculoskeletal ROS (+) Arthritis ,   Abdominal   Peds negative pediatric ROS (+)  Hematology negative hematology ROS (+) Blood dyscrasia, anemia ,   Anesthesia Other Findings   Reproductive/Obstetrics negative OB ROS                             Anesthesia Physical Anesthesia Plan  ASA: III  Anesthesia Plan: MAC   Post-op Pain Management:    Induction: Intravenous  PONV Risk Score and Plan: 2 and Ondansetron, Dexamethasone and Treatment may vary due to age or medical condition  Airway Management Planned:   Additional Equipment:   Intra-op Plan:   Post-operative Plan:   Informed Consent: I have reviewed the patients History and Physical, chart, labs and discussed the procedure including the risks, benefits and alternatives for the proposed anesthesia with the patient or authorized representative who has indicated his/her understanding and acceptance.   Dental advisory given  Plan Discussed with: CRNA  Anesthesia Plan Comments:         Anesthesia Quick Evaluation

## 2017-02-19 ENCOUNTER — Inpatient Hospital Stay (HOSPITAL_COMMUNITY): Payer: Medicare Other

## 2017-02-19 ENCOUNTER — Ambulatory Visit: Payer: Medicare Other | Admitting: Internal Medicine

## 2017-02-19 ENCOUNTER — Inpatient Hospital Stay (HOSPITAL_COMMUNITY): Payer: Medicare Other | Admitting: Anesthesiology

## 2017-02-19 ENCOUNTER — Encounter (HOSPITAL_COMMUNITY)
Admission: EM | Disposition: A | Discharge status: Home / Self Care | Payer: Self-pay | Source: Home / Self Care | Attending: Student in an Organized Health Care Education/Training Program

## 2017-02-19 DIAGNOSIS — Z8673 Personal history of transient ischemic attack (TIA), and cerebral infarction without residual deficits: Secondary | ICD-10-CM

## 2017-02-19 HISTORY — PX: AV FISTULA PLACEMENT: SHX1204

## 2017-02-19 HISTORY — PX: INSERTION OF DIALYSIS CATHETER: SHX1324

## 2017-02-19 LAB — CBC
HEMATOCRIT: 24.8 % — AB (ref 36.0–46.0)
HEMOGLOBIN: 8 g/dL — AB (ref 12.0–15.0)
MCH: 28.3 pg (ref 26.0–34.0)
MCHC: 32.3 g/dL (ref 30.0–36.0)
MCV: 87.6 fL (ref 78.0–100.0)
PLATELETS: 138 10*3/uL — AB (ref 150–400)
RBC: 2.83 MIL/uL — AB (ref 3.87–5.11)
RDW: 16.9 % — ABNORMAL HIGH (ref 11.5–15.5)
WBC: 8.9 10*3/uL (ref 4.0–10.5)

## 2017-02-19 LAB — RENAL FUNCTION PANEL
ANION GAP: 18 — AB (ref 5–15)
Albumin: 3.1 g/dL — ABNORMAL LOW (ref 3.5–5.0)
BUN: 137 mg/dL — ABNORMAL HIGH (ref 6–20)
CHLORIDE: 117 mmol/L — AB (ref 101–111)
CO2: 9 mmol/L — AB (ref 22–32)
CREATININE: 11.76 mg/dL — AB (ref 0.44–1.00)
Calcium: 7.6 mg/dL — ABNORMAL LOW (ref 8.9–10.3)
GFR, EST AFRICAN AMERICAN: 3 mL/min — AB (ref >60)
GFR, EST NON AFRICAN AMERICAN: 3 mL/min — AB (ref >60)
Glucose, Bld: 84 mg/dL (ref 65–99)
Phosphorus: 8.3 mg/dL — ABNORMAL HIGH (ref 2.5–4.6)
Potassium: 5.2 mmol/L — ABNORMAL HIGH (ref 3.5–5.1)
Sodium: 144 mmol/L (ref 135–145)

## 2017-02-19 LAB — HEPATITIS B SURFACE ANTIGEN: Hepatitis B Surface Ag: NEGATIVE

## 2017-02-19 LAB — LACTATE DEHYDROGENASE: LDH: 213 U/L — AB (ref 98–192)

## 2017-02-19 SURGERY — INSERTION OF DIALYSIS CATHETER
Anesthesia: Monitor Anesthesia Care | Site: Neck | Laterality: Right

## 2017-02-19 MED ORDER — MEPERIDINE HCL 25 MG/ML IJ SOLN: 6.2500 mg | INTRAMUSCULAR | Status: DC | PRN

## 2017-02-19 MED ORDER — PENTAFLUOROPROP-TETRAFLUOROETH EX AERO: 1.0000 "application " | INHALATION_SPRAY | CUTANEOUS | Status: DC | PRN

## 2017-02-19 MED ORDER — HEPARIN SODIUM (PORCINE) 1000 UNIT/ML IJ SOLN
INTRAMUSCULAR | Status: AC
  Filled 2017-02-19: qty 1

## 2017-02-19 MED ORDER — PHENYLEPHRINE HCL 10 MG/ML IJ SOLN
INTRAVENOUS | Status: DC | PRN
  Administered 2017-02-19: 50 ug/min via INTRAVENOUS

## 2017-02-19 MED ORDER — CEFAZOLIN SODIUM-DEXTROSE 1-4 GM/50ML-% IV SOLN
INTRAVENOUS | Status: DC | PRN
  Administered 2017-02-19: 2 g via INTRAVENOUS

## 2017-02-19 MED ORDER — LIDOCAINE HCL (CARDIAC) 20 MG/ML IV SOLN
INTRAVENOUS | Status: DC | PRN
  Administered 2017-02-19: 100 mg via INTRATRACHEAL

## 2017-02-19 MED ORDER — ALTEPLASE 2 MG IJ SOLR: 2.0000 mg | Freq: Once | INTRAMUSCULAR | Status: DC | PRN

## 2017-02-19 MED ORDER — FAMOTIDINE 20 MG PO TABS
20.0000 mg | ORAL_TABLET | Freq: Every day | ORAL | Status: DC
  Administered 2017-02-20 – 2017-02-23 (×3): 20 mg via ORAL
  Filled 2017-02-19 (×4): qty 1

## 2017-02-19 MED ORDER — LIDOCAINE-PRILOCAINE 2.5-2.5 % EX CREA: 1.0000 "application " | TOPICAL_CREAM | CUTANEOUS | Status: DC | PRN

## 2017-02-19 MED ORDER — LIDOCAINE 2% (20 MG/ML) 5 ML SYRINGE
INTRAMUSCULAR | Status: AC
  Filled 2017-02-19: qty 5

## 2017-02-19 MED ORDER — LIDOCAINE HCL (PF) 1 % IJ SOLN: 5.0000 mL | INTRAMUSCULAR | Status: DC | PRN

## 2017-02-19 MED ORDER — LIDOCAINE-EPINEPHRINE (PF) 1 %-1:200000 IJ SOLN
INTRAMUSCULAR | Status: AC
  Filled 2017-02-19: qty 30

## 2017-02-19 MED ORDER — SODIUM CHLORIDE 0.9 % IV SOLN
INTRAVENOUS | Status: DC | PRN
  Administered 2017-02-19: 09:00:00 via INTRAVENOUS

## 2017-02-19 MED ORDER — HEPARIN SODIUM (PORCINE) 1000 UNIT/ML DIALYSIS: 20.0000 [IU]/kg | INTRAMUSCULAR | Status: DC | PRN

## 2017-02-19 MED ORDER — PROPOFOL 500 MG/50ML IV EMUL
INTRAVENOUS | Status: DC | PRN
  Administered 2017-02-19: 100 ug/kg/min via INTRAVENOUS

## 2017-02-19 MED ORDER — HEPARIN SODIUM (PORCINE) 1000 UNIT/ML IJ SOLN
INTRAMUSCULAR | Status: DC | PRN
  Administered 2017-02-19: 1000 [IU]

## 2017-02-19 MED ORDER — LIDOCAINE-EPINEPHRINE (PF) 1 %-1:200000 IJ SOLN
INTRAMUSCULAR | Status: DC | PRN
  Administered 2017-02-19: 10 mL

## 2017-02-19 MED ORDER — HEPARIN SODIUM (PORCINE) 1000 UNIT/ML DIALYSIS: 1000.0000 [IU] | INTRAMUSCULAR | Status: DC | PRN

## 2017-02-19 MED ORDER — SODIUM CHLORIDE 0.9 % IV SOLN
INTRAVENOUS | Status: DC | PRN
  Administered 2017-02-19: 08:00:00

## 2017-02-19 MED ORDER — 0.9 % SODIUM CHLORIDE (POUR BTL) OPTIME
TOPICAL | Status: DC | PRN
  Administered 2017-02-19: 1000 mL

## 2017-02-19 MED ORDER — CEFAZOLIN SODIUM 1 G IJ SOLR
INTRAMUSCULAR | Status: AC
  Filled 2017-02-19: qty 20

## 2017-02-19 MED ORDER — LIDOCAINE HCL (PF) 1 % IJ SOLN
INTRAMUSCULAR | Status: AC
  Filled 2017-02-19: qty 30

## 2017-02-19 MED ORDER — PROMETHAZINE HCL 25 MG/ML IJ SOLN: 6.2500 mg | INTRAMUSCULAR | Status: DC | PRN

## 2017-02-19 MED ORDER — LIDOCAINE-EPINEPHRINE 1 %-1:100000 IJ SOLN
INTRAMUSCULAR | Status: AC
  Filled 2017-02-19: qty 1

## 2017-02-19 MED ORDER — SODIUM CHLORIDE 0.9 % IV SOLN: 100.0000 mL | INTRAVENOUS | Status: DC | PRN

## 2017-02-19 MED ORDER — PROPOFOL 10 MG/ML IV BOLUS
INTRAVENOUS | Status: DC | PRN
  Administered 2017-02-19: 30 mg via INTRAVENOUS

## 2017-02-19 MED ORDER — FENTANYL CITRATE (PF) 250 MCG/5ML IJ SOLN
INTRAMUSCULAR | Status: AC
  Filled 2017-02-19: qty 5

## 2017-02-19 MED ORDER — FENTANYL CITRATE (PF) 100 MCG/2ML IJ SOLN
INTRAMUSCULAR | Status: DC | PRN
Start: 2017-02-19 — End: 2017-02-19
  Administered 2017-02-19: 50 ug via INTRAVENOUS

## 2017-02-19 MED ORDER — PROPOFOL 10 MG/ML IV BOLUS
INTRAVENOUS | Status: AC
  Filled 2017-02-19: qty 20

## 2017-02-19 MED ORDER — FENTANYL CITRATE (PF) 100 MCG/2ML IJ SOLN: 25.0000 ug | INTRAMUSCULAR | Status: DC | PRN

## 2017-02-19 SURGICAL SUPPLY — 53 items
ADH SKN CLS APL DERMABOND .7 (GAUZE/BANDAGES/DRESSINGS) ×4
ARMBAND PINK RESTRICT EXTREMIT (MISCELLANEOUS) ×6 IMPLANT
BAG DECANTER FOR FLEXI CONT (MISCELLANEOUS) ×4 IMPLANT
BIOPATCH RED 1 DISK 7.0 (GAUZE/BANDAGES/DRESSINGS) ×3 IMPLANT
BIOPATCH RED 1IN DISK 7.0MM (GAUZE/BANDAGES/DRESSINGS) ×1
CANISTER SUCT 3000ML PPV (MISCELLANEOUS) ×4 IMPLANT
CANNULA VESSEL 3MM 2 BLNT TIP (CANNULA) ×4 IMPLANT
CATH PALINDROME RT-P 15FX23CM (CATHETERS) ×2 IMPLANT
CLIP LIGATING EXTRA MED SLVR (CLIP) ×4 IMPLANT
CLIP LIGATING EXTRA SM BLUE (MISCELLANEOUS) ×4 IMPLANT
COVER PROBE W GEL 5X96 (DRAPES) ×4 IMPLANT
COVER SURGICAL LIGHT HANDLE (MISCELLANEOUS) ×4 IMPLANT
DECANTER SPIKE VIAL GLASS SM (MISCELLANEOUS) ×4 IMPLANT
DERMABOND ADVANCED (GAUZE/BANDAGES/DRESSINGS) ×4
DERMABOND ADVANCED .7 DNX12 (GAUZE/BANDAGES/DRESSINGS) ×2 IMPLANT
DRAPE C-ARM 42X72 X-RAY (DRAPES) ×4 IMPLANT
DRAPE CHEST BREAST 15X10 FENES (DRAPES) ×4 IMPLANT
ELECT REM PT RETURN 9FT ADLT (ELECTROSURGICAL) ×4
ELECTRODE REM PT RTRN 9FT ADLT (ELECTROSURGICAL) ×2 IMPLANT
GLOVE BIOGEL PI IND STRL 6.5 (GLOVE) IMPLANT
GLOVE BIOGEL PI IND STRL 7.0 (GLOVE) IMPLANT
GLOVE BIOGEL PI IND STRL 7.5 (GLOVE) IMPLANT
GLOVE BIOGEL PI INDICATOR 6.5 (GLOVE) ×8
GLOVE BIOGEL PI INDICATOR 7.0 (GLOVE) ×4
GLOVE BIOGEL PI INDICATOR 7.5 (GLOVE) ×2
GLOVE ECLIPSE 6.5 STRL STRAW (GLOVE) ×8 IMPLANT
GLOVE ECLIPSE 7.0 STRL STRAW (GLOVE) ×2 IMPLANT
GLOVE SS BIOGEL STRL SZ 7.5 (GLOVE) ×2 IMPLANT
GLOVE SUPERSENSE BIOGEL SZ 7.5 (GLOVE) ×2
GOWN STRL REUS W/ TWL LRG LVL3 (GOWN DISPOSABLE) ×6 IMPLANT
GOWN STRL REUS W/TWL LRG LVL3 (GOWN DISPOSABLE) ×24
KIT BASIN OR (CUSTOM PROCEDURE TRAY) ×4 IMPLANT
KIT ROOM TURNOVER OR (KITS) ×4 IMPLANT
NDL 18GX1X1/2 (RX/OR ONLY) (NEEDLE) ×2 IMPLANT
NEEDLE 18GX1X1/2 (RX/OR ONLY) (NEEDLE) ×4 IMPLANT
NEEDLE 22X1 1/2 (OR ONLY) (NEEDLE) IMPLANT
NS IRRIG 1000ML POUR BTL (IV SOLUTION) ×4 IMPLANT
PACK CV ACCESS (CUSTOM PROCEDURE TRAY) ×4 IMPLANT
PACK SURGICAL SETUP 50X90 (CUSTOM PROCEDURE TRAY) ×4 IMPLANT
PAD ARMBOARD 7.5X6 YLW CONV (MISCELLANEOUS) ×8 IMPLANT
SOAP 2 % CHG 4 OZ (WOUND CARE) ×4 IMPLANT
SUT ETHILON 3 0 PS 1 (SUTURE) ×4 IMPLANT
SUT PROLENE 6 0 CC (SUTURE) ×4 IMPLANT
SUT VIC AB 3-0 SH 27 (SUTURE) ×4
SUT VIC AB 3-0 SH 27X BRD (SUTURE) ×2 IMPLANT
SUT VICRYL 4-0 PS2 18IN ABS (SUTURE) ×4 IMPLANT
SYR 10ML LL (SYRINGE) ×4 IMPLANT
SYR 20CC LL (SYRINGE) ×4 IMPLANT
SYR 5ML LL (SYRINGE) ×8 IMPLANT
SYR CONTROL 10ML LL (SYRINGE) ×4 IMPLANT
TOWEL GREEN STERILE (TOWEL DISPOSABLE) ×4 IMPLANT
UNDERPAD 30X30 (UNDERPADS AND DIAPERS) ×4 IMPLANT
WATER STERILE IRR 1000ML POUR (IV SOLUTION) ×4 IMPLANT

## 2017-02-19 NOTE — H&P (View-Only) (Signed)
Patient name: Nancy [name] MRN: [id number] DOB: November 01, 1940 Sex: female  REASON FOR CONSULT: [place] and permanent dialysis access, consult is from Juanell Fairly NP   HPI:   Nancy [name] is a 76 y.o. female, with new ESRD in need of dialysis access. She is right handed. Has never had access procedures before. She presented to the [facility] ED today after falling at home when her right leg gave out. She reports progressive swelling in her legs. Workup revealed Hgb of 5.1, creatinine of 12 and BUN of 143. Has previously refused dialysis initiation in the past.  Has history of CVA with no residual deficits. No known cardiac issues. Has hypertension managed on norvasc, coreg and lasix.   Past Medical History:  Diagnosis Date  . Aortic stenosis   . Bacterial sinusitis 09/17/2011  . CKD (chronic kidney disease) stage 4, GFR 15-29 ml/min ([place]) 08/11/2006   Cr continues to increase. Proteinuria on UA 02/10/12.    . Colitis   . CVA (cerebrovascular accident) [facility])    New hemorrhagic per CT scan '09  . Diverticulosis of colon   . Dysfunctional uterine bleeding   . Fecal impaction ([place])   . Headache(784.0)   . HERNIORRHAPHY, HX OF 08/11/2006  . Hypertension   . OA (osteoarthritis)    bilateral knees  . Postmenopausal   . Pulmonary nodule   . TINEA CRURIS 01/12/2007    Past Surgical History:  Procedure Laterality Date  . ABDOMINAL HYSTERECTOMY    . CHOLECYSTECTOMY  2009  . INGUINAL HERNIA REPAIR  2008  . IRIDOTOMY / IRIDECTOMY     Laser, right eye 12/26/11 left eye 01/24/12  . MASS EXCISION Left 05/07/2013   Procedure: EXCISION CYST;  Surgeon: Myrtha Mantis., MD;  Location: [place];  Service: Ophthalmology;  Laterality: Left;    Family History  Problem Relation Age of Onset  . Hypertension Mother   . Heart attack Mother   . Heart disease Mother      SOCIAL HISTORY:   Social History   Social History  . Marital status: Married    Spouse name: N/A  .  Number of children: N/A  . Years of education: N/A   Occupational History  . Not on file.   Social History Main Topics  . Smoking status: Never Smoker  . Smokeless tobacco: Never Used  . Alcohol use No  . Drug use: No     Comment: 08/15/08 UDS + cocaine  . Sexual activity: Not Currently   Other Topics Concern  . Not on file   Social History Narrative   Married, lives with her husband. 1 child.           Allergies  Allergen Reactions  . Hydrocodone-Acetaminophen Nausea And Vomiting and Other (See Comments)    Dizziness (also)    MEDICATIONS:    Current Facility-Administered Medications  Medication Dose Route Frequency Provider Last Rate Last Dose  . 0.9 %  sodium chloride infusion   Intravenous Once Daleen Bo, MD      . acetaminophen (TYLENOL) tablet 1,000 mg  1,000 mg Oral Q8H PRN Collier Salina, MD      . aspirin EC tablet 81 mg  81 mg Oral Daily Collier Salina, MD   81 mg at 02/18/17 1114  . calcitRIOL (ROCALTROL) capsule 0.25 mcg  0.25 mcg Oral Daily Collier Salina, MD   0.25 mcg at 02/18/17 1113  . cinacalcet (SENSIPAR) tablet 60 mg  60 mg Oral Q breakfast Collier Salina, MD   60 mg at 02/18/17 0809  . cloNIDine (CATAPRES - Dosed in mg/24 hr) patch 0.2 mg  0.2 mg Transdermal Weekly Lacroce, Hulen Shouts, MD      . Darbepoetin Alfa (ARANESP) injection 100 mcg  100 mcg Subcutaneous Once Melanee Spry, MD      . famotidine (PEPCID) tablet 20 mg  20 mg Oral BID Collier Salina, MD   20 mg at 02/18/17 1114  . heparin injection 5,000 Units  5,000 Units Subcutaneous Q8H Collier Salina, MD   5,000 Units at 02/18/17 1406  . HYDROmorphone (DILAUDID) injection 0.5 mg  0.5 mg Intravenous Q2H PRN Collier Salina, MD      . nitroGLYCERIN (NITROSTAT) SL tablet 0.4 mg  0.4 mg Sublingual Q5 min PRN Collier Salina, MD   0.4 mg at 02/18/17 0434  . ondansetron (ZOFRAN-ODT) disintegrating tablet 4 mg  4 mg Oral Q6H PRN Collier Salina, MD    4 mg at 02/18/17 1039  . senna-docusate (Senokot-S) tablet 1 tablet  1 tablet Oral QHS PRN Collier Salina, MD      . sodium bicarbonate tablet 650 mg  650 mg Oral BID Collier Salina, MD   650 mg at 02/18/17 1114  . traMADol (ULTRAM) tablet 50 mg  50 mg Oral Q12H PRN Collier Salina, MD   50 mg at 02/18/17 0110    REVIEW OF SYSTEMS:    REVIEW OF SYSTEMS (negative unless checked):   Cardiac:  []  Chest pain or chest pressure? []  Shortness of breath upon activity? []  Shortness of breath when lying flat? []  Irregular heart rhythm?  Vascular:  []  Pain in calf, thigh, or hip brought on by walking? []  Pain in feet at night that wakes you up from your sleep? []  Blood clot in your veins? [x]  Leg swelling?  Pulmonary:  []  Oxygen at home? []  Productive cough? []  Wheezing?  Neurologic:  []  Sudden weakness in arms or legs? []  Sudden numbness in arms or legs? []  Sudden onset of difficult speaking or slurred speech? []  Temporary loss of vision in one eye? []  Problems with dizziness?  Gastrointestinal:  []  Blood in stool? []  Vomited blood?  Genitourinary:  []  Burning when urinating? []  Blood in urine?  Psychiatric:  []  Major depression  Hematologic:  []  Bleeding problems? []  Problems with blood clotting?  Dermatologic:  []  Rashes or ulcers?  Constitutional:  []  Fever or chills?  Ear/Nose/Throat:  []  Change in hearing? []  Nose bleeds? []  Sore throat?  Musculoskeletal:  []  Back pain? []  Joint pain? []  Muscle pain?   PHYSICAL EXAM:    Vitals:   02/18/17 0330 02/18/17 0410 02/18/17 0801 02/18/17 0901  BP: (!) 144/61 140/64 (!) 146/77 (!) 163/78  Pulse: 95 98 98 99  Resp: 18 20 17 18   Temp: 98.6 F (37 C) 97.8 F (36.6 C) 98 F (36.7 C) 98.6 F (37 C)  TempSrc: Oral Oral Oral Oral  SpO2:   100% 100%  Weight:      Height:        GENERAL: The patient is a well-nourished female, in no acute distress. The vital signs are documented  above. HEENT: normocephalic, atraumatic, no abnormalities noted.  CARDIAC: There is a regular rate and rhythm. Systolic ejection murmur. No carotid bruits: transmitted SEM.  VASCULAR: 2+ radial and brachial pulses bilaterally. Non palpable pedal pulses but feet are warm bilaterally.  PULMONARY: There is good  air exchange bilaterally without wheezing or rales. ABDOMEN: Soft and non-tender with normal pitched bowel sounds.  MUSCULOSKELETAL: Bilateral lower extremity edema 1+ NEUROLOGIC: No focal weakness or paresthesias are detected. SKIN: There are no ulcers or rashes noted. PSYCHIATRIC: The patient has a normal affect. NECK: Supple, no nuchal rigidity, no palpable LAD LYMPH:  No Cervical, Axillary, or Inguinal lymphadenopathy    Radiology     Ct Head Wo Contrast  Result Date: 02/17/2017 CLINICAL DATA:  76 year old hypertensive female tripped and fell hitting knee. Slurred speech and right facial droop since yesterday. Prior stroke. Symptoms are new. Initial encounter. EXAM: CT HEAD WITHOUT CONTRAST TECHNIQUE: Contiguous axial images were obtained from the base of the skull through the vertex without intravenous contrast. COMPARISON:  12/08/2016 CT and MR. FINDINGS: Brain: No intracranial hemorrhage or CT evidence of large acute infarct. Moderate chronic microvascular changes. Remote left cerebellar hemorrhagic infarct. Moderate global atrophy without hydrocephalus. No intracranial mass lesion noted on this unenhanced exam. Vascular: Vascular calcifications. Skull: No skull fracture. Sinuses/Orbits: No acute orbital abnormality. Visualized paranasal sinuses are clear. Other: Mastoid air cells and middle ear cavities are clear. IMPRESSION: No acute intracranial hemorrhage or CT evidence of large acute infarct. Moderate chronic microvascular changes. Remote left cerebellar hemorrhage acute infarct. Global atrophy. Electronically Signed   By: Genia Del M.D.   On: 02/17/2017 19:16   Dg Knee  Complete 4 Views Right  Result Date: 02/17/2017 CLINICAL DATA:  Patient fell.  Now with anterior right knee pain. EXAM: RIGHT KNEE - COMPLETE 4+ VIEW COMPARISON:  02/09/2014. FINDINGS: No fracture. No subluxation or dislocation. Prominent degenerative spurring noted in all 3 compartments. Small joint effusion evident. IMPRESSION: Tricompartmental degenerative changes without acute bony abnormality. Electronically Signed   By: Misty Stanley M.D.   On: 02/17/2017 20:26    Laboratory   CBC CBC Latest Ref Rng & Units 02/18/2017 02/17/2017 02/17/2017  WBC 4.0 - 10.5 K/uL 9.9 - 6.3  Hemoglobin 12.0 - 15.0 g/dL 7.9(L) 5.4(LL) 5.1(LL)  Hematocrit 36.0 - 46.0 % 23.9(L) 16.0(L) 15.9(L)  Platelets 150 - 400 K/uL 153 - 152    BMP BMP Latest Ref Rng & Units 02/17/2017 02/17/2017 01/15/2017  Glucose 65 - 99 mg/dL 115(H) 118(H) -  BUN 6 - 20 mg/dL 133(H) 143(H) -  Creatinine 0.44 - 1.00 mg/dL 13.20(H) 12.13(H) -  BUN/Creat Ratio 12 - 28 - - -  Sodium 135 - 145 mmol/L 143 142 -  Potassium 3.5 - 5.1 mmol/L 4.8 4.9 -  Chloride 101 - 111 mmol/L 118(H) 115(H) -  CO2 22 - 32 mmol/L - 11(L) -  Calcium 8.9 - 10.3 mg/dL - 8.3(L) 8.1(L)    Coagulation Lab Results  Component Value Date   INR 1.30 02/17/2017   INR 1.14 09/21/2016   INR 1.06 07/13/2012   No results found for: PTT  Lipids    Component Value Date/Time   CHOL 109 12/09/2016 0129   TRIG 110 12/09/2016 0129   HDL 37 (L) 12/09/2016 0129   CHOLHDL 2.9 12/09/2016 0129   VLDL 22 12/09/2016 0129   LDLCALC 50 12/09/2016 0129    ASSESSMENT/PLAN:     New ESRD  If patient is agreeable to HD, will plan for [facility] placement tomorrow. Will go ahead and schedule TDC.  Unsure if vein mapping can be obtained today. If not, will plan for fistula vs graft in near future.   Virgina Jock, PA-C [facility] [phone]   Addendum  I have independently  interviewed and examined the patient, and I agree with the physician  assistant's findings.  Pt is RHD.  Vein mapping pending.  Renal requests TDC placement to start HD.  Will place [facility] tomorrow.  With low H/H and transfusion in progress, will need to see if medically stable for L AVF vs AVG placement.   Adele Barthel, MD, FACS [facility] [place] Office: [phone] Pager: [phone]  02/18/2017, 3:40 PM

## 2017-02-19 NOTE — Anesthesia Postprocedure Evaluation (Addendum)
Anesthesia Post Note  Patient: Nancy [name]  Procedure(s) Performed: Procedure(s) (LRB): INSERTION OF TUNNELED DIALYSIS CATHETER - RIGHT INTERNAL JUGULAR PLACEMENT (Right) CREATION OF LEFT ARM BRACHIOCEPHALIC ARTERIOVENOUS (AV) FISTULA (Left)     Patient location during evaluation: PACU Anesthesia Type: MAC Level of consciousness: awake and alert Pain management: pain level controlled Vital Signs Assessment: post-procedure vital signs reviewed and stable Respiratory status: spontaneous breathing Cardiovascular status: stable Anesthetic complications: no    Last Vitals:  Vitals:   02/19/17 1224 02/19/17 1250  BP: (!) 161/73 (!) 175/82  Pulse: 97 98  Resp: 15 17  Temp: (!) 36.4 C 36.4 C  SpO2: 100% 96%    Last Pain:  Vitals:   02/19/17 1250  TempSrc: Oral  PainSc:                  Nolon Nations

## 2017-02-19 NOTE — Op Note (Signed)
    OPERATIVE REPORT  DATE OF SURGERY: 02/19/2017  PATIENT: Nancy [name], 77 y.o. female MRN: [id number]  DOB: May 31, 1941  PRE-OPERATIVE DIAGNOSIS: End-stage renal disease  POST-OPERATIVE DIAGNOSIS:  Same  PROCEDURE: #1 right IJ hemodialysis catheter, #2 left first stage brachiobasilic fistula  SURGEON:  Curt Jews, M.D.  PHYSICIAN ASSISTANT: Gerri Lins PA-C  ANESTHESIA:  Local with sedation  EBL: Minimal ml  Total I/O In: 300 [I.V.:300] Out: 10 [Blood:10]  BLOOD ADMINISTERED: None  DRAINS: None  SPECIMEN: None  COUNTS CORRECT:  YES  PLAN OF CARE: PACU   PATIENT DISPOSITION:  PACU - hemodynamically stable  PROCEDURE DETAILS: Patient was taken to the operative placed supine position where the area of the right and left neck and chest prepped and draped in sterile fashion. SonoSite ultrasound was used to visualize the jugular vein which is very large on the right side. The patient was placed in Trendelenburg position and using local anesthesia with SonoSite visualization the internal jugular vein was accessed with an 18-gauge needle. A guidewire was passed through the 18-gauge needle down to the right atrium and this was confirmed with fluoroscopy. The dilator and peel-away sheath was passed over the guidewire and the dilator and guidewire removed. A 23 cm tunnel catheter was brought through the peel-away sheath which was removed. The catheter tips were positioned at the level of the distal right atrium. The catheter was brought through subcutaneous tunnel through a separate stab incision in the 2 lm ports were attached. Both lumens flushed and aspirated easily and were locked with 1000 unit per cc heparin. The catheter was secured to the skin with 3-0 nylon stitch and the entry site was closed with a 4 subcuticular Vicryl stitch. Sterile dressing was applied. Attention was then turned to the left arm. The left arm and left excellent were prepped and draped in the usual  sterile fashion. SonoSite ultrasound was used to visualize the veins. The patient had a good caliber basilic vein throughout the upper arm. This did branch above the upper arm with the larger of these 2 branches lying near the brachial artery at antecubital space. Using local anesthesia incision was made at the antecubital space and carried down to isolate the basilic vein branch which was of good caliber. Tributary branches were ligated with 4-0 silk and divided. The vein was ligated distally and divided and was gently dilated with heparinized saline and was of good size. The brachial artery was identified through the same incision. The artery was occluded proximally and distally and was opened with 11 blade and sent loss really with Potts scissors. The vein was cut to appropriate length and spatulated and sewn end-to-side to the artery with a running 6-0 Prolene suture. Clamps removed and excellent thrill was noted. The wound irrigated with saline. Hemostasis cautery. Wounds were closed with 3-0 Vicryl in the subcutaneous and subcuticular tissue. Sterile dressing was applied. The patient did have good biphasic signal at the wrist with Doppler in the radial and ulnar arteries and there was some augmentation with occlusion of the vein fistula.   Nancy [name], M.D., [facility] 02/19/2017 10:23 AM

## 2017-02-19 NOTE — Progress Notes (Signed)
Patient ID: Nancy [name], female   DOB: 07/24/1940, 76 y.o.   MRN: [id number] S:I'm cold O:BP (!) 175/82 (BP Location: Right Arm)   Pulse 98   Temp 97.6 F (36.4 C) (Oral)   Resp 17   Ht 5\' 3"  (1.6 m)   Wt 65.2 kg (143 lb 11.8 oz)   SpO2 96%   BMI 25.46 kg/m   Intake/Output Summary (Last 24 hours) at 02/19/17 1351 Last data filed at 02/19/17 1249  Gross per 24 hour  Intake              420 ml  Output             1260 ml  Net             -840 ml   Intake/Output: I/O last 3 completed shifts: In: 47 [P.O.:330; Blood:818] Out: 1300 [Urine:600; Stool:700]  Intake/Output this shift:  Total I/O In: 300 [I.V.:300] Out: 160 [Urine:150; Blood:10] Weight change: 0.2 kg (7.1 oz) Gen:NAD CVS: RRR, III/VI SEM Resp: cta [id number] Ext:no edema, LUE AVF +T/B   Recent Labs Lab 02/17/17 1832 02/17/17 1901 02/19/17 0703  NA 142 143 144  K 4.9 4.8 5.2*  CL 115* 118* 117*  CO2 11*  --  9*  GLUCOSE 118* 115* 84  BUN 143* 133* 137*  CREATININE 12.13* 13.20* 11.76*  ALBUMIN 3.3*  --  3.1*  CALCIUM 8.3*  --  7.6*  PHOS  --   --  8.3*  AST 7*  --   --   ALT 6*  --   --    Liver Function Tests:  Recent Labs Lab 02/17/17 1832 02/19/17 0703  AST 7*  --   ALT 6*  --   ALKPHOS 57  --   BILITOT 0.7  --   PROT 6.8  --   ALBUMIN 3.3* 3.1*   No results for input(s): LIPASE, AMYLASE in the last 168 hours. No results for input(s): AMMONIA in the last 168 hours. CBC:  Recent Labs Lab 02/17/17 1832 02/17/17 1901 02/18/17 1456 02/19/17 0703  WBC 6.3  --  9.9 8.9  NEUTROABS 5.2  --   --   --   HGB 5.1* 5.4* 7.9* 8.0*  HCT 15.9* 16.0* 23.9* 24.8*  MCV 85.9  --  86.0 87.6  PLT 152  --  153 138*   Cardiac Enzymes: No results for input(s): CKTOTAL, CKMB, CKMBINDEX, TROPONINI in the last 168 hours. CBG:  Recent Labs Lab 02/17/17 1834  GLUCAP 107*    Iron Studies: No results for input(s): IRON, TIBC, TRANSFERRIN, FERRITIN in the last 72 hours. Studies/Results: Ct  Head Wo Contrast  Result Date: 02/17/2017 CLINICAL DATA:  76 year old hypertensive female tripped and fell hitting knee. Slurred speech and right facial droop since yesterday. Prior stroke. Symptoms are new. Initial encounter. EXAM: CT HEAD WITHOUT CONTRAST TECHNIQUE: Contiguous axial images were obtained from the base of the skull through the vertex without intravenous contrast. COMPARISON:  12/08/2016 CT and MR. FINDINGS: Brain: No intracranial hemorrhage or CT evidence of large acute infarct. Moderate chronic microvascular changes. Remote left cerebellar hemorrhagic infarct. Moderate global atrophy without hydrocephalus. No intracranial mass lesion noted on this unenhanced exam. Vascular: Vascular calcifications. Skull: No skull fracture. Sinuses/Orbits: No acute orbital abnormality. Visualized paranasal sinuses are clear. Other: Mastoid air cells and middle ear cavities are clear. IMPRESSION: No acute intracranial hemorrhage or CT evidence of large acute infarct. Moderate chronic microvascular changes. Remote left cerebellar hemorrhage acute infarct. Global  atrophy. Electronically Signed   By: Genia Del M.D.   On: 02/17/2017 19:16   Dg Chest Port 1 View  Result Date: 02/19/2017 CLINICAL DATA:  Status post dialysis catheter placement. EXAM: PORTABLE CHEST 1 VIEW COMPARISON:  Radiograph of September 21, 2016. FINDINGS: Stable cardiomegaly. Atherosclerosis of thoracic aorta is noted. No pneumothorax or pleural effusion is noted. No acute pulmonary disease is noted. Interval placement of right internal jugular dialysis catheter with distal tip in expected position of cavoatrial junction. Bony thorax is unremarkable. IMPRESSION: Aortic atherosclerosis. Interval placement of right internal jugular dialysis catheter with distal tip in expected position of cavoatrial junction. No pneumothorax is noted. Electronically Signed   By: Marijo Conception, M.D.   On: 02/19/2017 11:57   Dg Knee Complete 4 Views  Right  Result Date: 02/17/2017 CLINICAL DATA:  Patient fell.  Now with anterior right knee pain. EXAM: RIGHT KNEE - COMPLETE 4+ VIEW COMPARISON:  02/09/2014. FINDINGS: No fracture. No subluxation or dislocation. Prominent degenerative spurring noted in all 3 compartments. Small joint effusion evident. IMPRESSION: Tricompartmental degenerative changes without acute bony abnormality. Electronically Signed   By: Misty Stanley M.D.   On: 02/17/2017 20:26   Dg Fluoro Guide Cv Line-no Report  Result Date: 02/19/2017 Fluoroscopy was utilized by the requesting physician.  No radiographic interpretation.   Marland Kitchen aspirin EC  81 mg Oral Daily  . calcitRIOL  0.25 mcg Oral Daily  . cinacalcet  60 mg Oral Q breakfast  . cloNIDine  0.2 mg Transdermal Weekly  . famotidine  20 mg Oral BID  . heparin  5,000 Units Subcutaneous Q8H  . sodium bicarbonate  650 mg Oral BID    BMET    Component Value Date/Time   NA 144 02/19/2017 0703   NA 143 01/15/2017 1055   K 5.2 (H) 02/19/2017 0703   CL 117 (H) 02/19/2017 0703   CO2 9 (L) 02/19/2017 0703   GLUCOSE 84 02/19/2017 0703   BUN 137 (H) 02/19/2017 0703   BUN 109 (HH) 01/15/2017 1055   CREATININE 11.76 (H) 02/19/2017 0703   CREATININE 5.79 (H) 11/25/2014 1011   CALCIUM 7.6 (L) 02/19/2017 0703   GFRNONAA 3 (L) 02/19/2017 0703   GFRNONAA 9 (L) 02/09/2014 0909   GFRAA 3 (L) 02/19/2017 0703   GFRAA 10 (L) 02/09/2014 0909   CBC    Component Value Date/Time   WBC 8.9 02/19/2017 0703   RBC 2.83 (L) 02/19/2017 0703   HGB 8.0 (L) 02/19/2017 0703   HGB 8.3 (L) 01/15/2017 1055   HCT 24.8 (L) 02/19/2017 0703   HCT 26.5 (L) 01/15/2017 1055   PLT 138 (L) 02/19/2017 0703   PLT 160 01/15/2017 1055   MCV 87.6 02/19/2017 0703   MCV 86 01/15/2017 1055   MCH 28.3 02/19/2017 0703   MCHC 32.3 02/19/2017 0703   RDW 16.9 (H) 02/19/2017 0703   RDW 17.6 (H) 01/15/2017 1055   LYMPHSABS 0.7 02/17/2017 1832   MONOABS 0.3 02/17/2017 1832   EOSABS 0.1 02/17/2017 1832    BASOSABS 0.0 02/17/2017 1832    Assessment/Plan: 1.  End stage renal disease with uremia- pt with asterixis, anorexia, profound anemia, and intermittent pericardial friction rub.  I had a frank conversation with [name] regarding dialysis vs dying at home with hospice.  She is now amenable to proceed with HD and understands that she has end stage renal disease.    1. Will initiate serial, slow HD today and follow 2. Vascular access- appreciate VVS  assistance.   1. s/p tunneled RIJ HD catheter and LUE AVF 02/19/17 3. Anemia of chronic kidney disease- s/p blood transufion and will start ESA with HD. 4. HTN- stable 5. Metabolic acidosis- plan for HD and can d/c sodium bicarb. 6. Right knee sprain 7. Secondary HPTH/Bone mineral metabolism- will check ca/phos/iPTH and restart sensipar, calcitriol, and renvela 8. Nutrition- will need renal diet and dietician education 9. Remote left cerebellar infarct- seen on CT  Donetta Potts, MD [facility] [phone]

## 2017-02-19 NOTE — Progress Notes (Signed)
   Subjective: Patient seen and examined in the PACU this morning. She underwent right IJ HD catheter placement and first stage left brachiobasilic fistula. She tolerated the procedures well.   Objective:  Vital signs in last 24 hours: Vitals:   02/18/17 1600 02/18/17 2126 02/19/17 0508 02/19/17 1021  BP: (!) 152/70 138/64 (!) 147/53 (!) 151/68  Pulse:  87 88 97  Resp: 18 19 20 10   Temp: 98.5 F (36.9 C) 98.7 F (37.1 C) 98.3 F (36.8 C) (!) 97.1 F (36.2 C)  TempSrc: Oral Oral Oral   SpO2: 100% 100% 98% 99%  Weight:  143 lb 11.8 oz (65.2 kg)    Height:       General: post surgical grogginess, laying in bed comfortably, NAD HEENT: [place]/AT, EOMI, no scleral icterus, +clean dry bandages over right IJ HD catheter Cardiac: RRR, 5/6 systolic murmur  Pulm: normal effort, CTAB Abd: soft, non tender, non distended, BS normal Ext: clean and dry post surgical incision near left cubital fossa, overall extremities well perfused, minimal pedal edema Neuro: alert and oriented X3, cranial nerves II-XII grossly intact   Assessment/Plan:  Principal Problem:   Symptomatic anemia Active Problems:   Essential hypertension   History of CVA (cerebrovascular accident)   GERD   Osteoarthrosis involving lower leg   CKD (chronic kidney disease) stage 5, GFR less than 15 ml/min (HCC) Symptomatic Anemia, ESRD Most likely secondary to anemia of Chronic Kidney Disease, Creatinine 13.20->11.76, BUN 133-->137, GFR 3. Hgb 8.0, HCT 24.8 s/p 2 units of PRBCs. Patient agreed to start HD s/p RIJ HD.  -Nephrology following, appreciate recs -> plan for first HD today -Restarted calcitriol, sensipar -Aranesp 100 mcg given today -BMET, CBC in AM  Metabolic acidosis secondary to end-stage renal disease Patient is on oral bicarb at home but is not taking. CO2 9 , Anion gap 18. -Continue sodium bicarb 650 mg BID  HTN BP stable at 151/68 today.  -Continue Clonidine patch  R knee pain Most likely a sprain,  imaging negative for acute bony abnormality -Tramadol for pain -Ice pack/heat when needed  Dispo: Anticipated discharge in approximately 1-2 day(s).   Melanee Spry, MD 02/19/2017, 11:13 AM Pager: [phone]

## 2017-02-19 NOTE — Progress Notes (Signed)
Medicine attending: I examined this patient together with resident physician Dr. Rochele Pages and I concur with her evaluation and management plan which we discussed together. We appreciate nephrology input.  Patient has consented to proceed with hemodialysis at this time. A tunneled vascular catheter was placed in the right subclavian position and a AV fistula was created in the left arm, brachial area.  We saw the patient immediately postop.  She tolerated both procedures well.  She is alert.  Lungs clear.  Regular cardiac rhythm.  Good radial pulse on the left.  No edema or calf tenderness. Impression: 1.  Stage V renal insufficiency.  She will now begin dialysis. 2.  Severe normochromic anemia secondary to #1.  She will begin iron/ESA 3.  Metabolic acidosis secondary to #1.  Currently on oral bicarbonate.  This should improve once dialysis is started. 4.  Hyperkalemia secondary to #1. 5.  Essential hypertension 6.  Aortic stenosis with mild aortic regurgitation 7.  Cerebrovascular disease with prior stroke 8.  Grade 1 diastolic dysfunction by echocardiogram June 2018

## 2017-02-19 NOTE — Anesthesia Procedure Notes (Addendum)
Procedure Name: MAC Date/Time: 02/19/2017 8:38 AM Performed by: Lance Coon Pre-anesthesia Checklist: Patient identified, Emergency Drugs available, Suction available, Patient being monitored and Timeout performed Patient Re-evaluated:Patient Re-evaluated prior to induction Oxygen Delivery Method: Nasal cannula

## 2017-02-19 NOTE — Interval H&P Note (Signed)
History and Physical Interval Note:  02/19/2017 8:29 AM  Nancy [name]  has presented today for surgery, with the diagnosis of End Stage Renal Disease N18.6  The various methods of treatment have been discussed with the patient and family. After consideration of risks, benefits and other options for treatment, the patient has consented to  Procedure(s): INSERTION OF TUNNELED DIALYSIS CATHETER (N/A) ARTERIOVENOUS (AV) FISTULA CREATION OF LEFT ARM VERSUS INSERTION OF LEFT ARM ARTERIOVENOUS GORTEX GRAFT (Left) as a surgical intervention .  The patient's history has been reviewed, patient examined, no change in status, stable for surgery.  I have reviewed the patient's chart and labs.  Questions were answered to the patient's satisfaction.     Curt Jews

## 2017-02-19 NOTE — Transfer of Care (Signed)
Immediate Anesthesia Transfer of Care Note  Patient: [place] B Hintz  Procedure(s) Performed: Procedure(s): INSERTION OF TUNNELED DIALYSIS CATHETER - RIGHT INTERNAL JUGULAR PLACEMENT (Right) CREATION OF LEFT ARM BRACHIOCEPHALIC ARTERIOVENOUS (AV) FISTULA (Left)  Patient Location: PACU  Anesthesia Type:MAC  Level of Consciousness: sedated and patient cooperative  Airway & Oxygen Therapy: Patient Spontanous Breathing  Post-op Assessment: Report given to RN and Post -op Vital signs reviewed and stable  Post vital signs: Reviewed and stable  Last Vitals:  Vitals:   02/19/17 0508 02/19/17 1021  BP: (!) 147/53   Pulse: 88   Resp: 20   Temp: 36.8 C (!) (P) 36.2 C  SpO2: 98%     Last Pain:  Vitals:   02/19/17 0508  TempSrc: Oral  PainSc:          Complications: No apparent anesthesia complications

## 2017-02-20 ENCOUNTER — Telehealth: Payer: Self-pay | Admitting: Vascular Surgery

## 2017-02-20 ENCOUNTER — Encounter (HOSPITAL_COMMUNITY): Payer: Self-pay | Admitting: Vascular Surgery

## 2017-02-20 LAB — TYPE AND SCREEN
ABO/RH(D): A POS
Antibody Screen: NEGATIVE
UNIT DIVISION: 0
UNIT DIVISION: 0
Unit division: 0

## 2017-02-20 LAB — CBC
HEMATOCRIT: 20.4 % — AB (ref 36.0–46.0)
Hemoglobin: 6.7 g/dL — CL (ref 12.0–15.0)
MCH: 28 pg (ref 26.0–34.0)
MCHC: 32.8 g/dL (ref 30.0–36.0)
MCV: 85.4 fL (ref 78.0–100.0)
PLATELETS: 106 10*3/uL — AB (ref 150–400)
RBC: 2.39 MIL/uL — ABNORMAL LOW (ref 3.87–5.11)
RDW: 16.7 % — AB (ref 11.5–15.5)
WBC: 8.2 10*3/uL (ref 4.0–10.5)

## 2017-02-20 LAB — BASIC METABOLIC PANEL
Anion gap: 12 (ref 5–15)
BUN: 63 mg/dL — AB (ref 6–20)
CO2: 20 mmol/L — ABNORMAL LOW (ref 22–32)
CREATININE: 7.03 mg/dL — AB (ref 0.44–1.00)
Calcium: 7 mg/dL — ABNORMAL LOW (ref 8.9–10.3)
Chloride: 109 mmol/L (ref 101–111)
GFR calc Af Amer: 6 mL/min — ABNORMAL LOW (ref >60)
GFR, EST NON AFRICAN AMERICAN: 5 mL/min — AB (ref >60)
GLUCOSE: 92 mg/dL (ref 65–99)
POTASSIUM: 3.4 mmol/L — AB (ref 3.5–5.1)
SODIUM: 141 mmol/L (ref 135–145)

## 2017-02-20 LAB — BPAM RBC
BLOOD PRODUCT EXPIRATION DATE: 201808292359
BLOOD PRODUCT EXPIRATION DATE: 201808292359
BLOOD PRODUCT EXPIRATION DATE: 201809032359
ISSUE DATE / TIME: 201808140330
ISSUE DATE / TIME: 201808132308
UNIT TYPE AND RH: 6200
UNIT TYPE AND RH: 6200
Unit Type and Rh: 6200

## 2017-02-20 LAB — HEMOGLOBIN AND HEMATOCRIT, BLOOD
HCT: 22.6 % — ABNORMAL LOW (ref 36.0–46.0)
Hemoglobin: 7.4 g/dL — ABNORMAL LOW (ref 12.0–15.0)

## 2017-02-20 LAB — GLUCOSE, CAPILLARY: GLUCOSE-CAPILLARY: 84 mg/dL (ref 65–99)

## 2017-02-20 LAB — PREPARE RBC (CROSSMATCH)

## 2017-02-20 LAB — HEPATITIS B CORE ANTIBODY, TOTAL: HEP B C TOTAL AB: NEGATIVE

## 2017-02-20 LAB — HEPATITIS B SURFACE ANTIBODY,QUALITATIVE: HEP B S AB: NONREACTIVE

## 2017-02-20 MED ORDER — ONDANSETRON HCL 4 MG/2ML IJ SOLN
4.0000 mg | Freq: Four times a day (QID) | INTRAMUSCULAR | Status: DC | PRN
  Administered 2017-02-20: 4 mg via INTRAVENOUS
  Filled 2017-02-20 (×2): qty 2

## 2017-02-20 NOTE — Progress Notes (Signed)
Patient ID: Nancy [name], female   DOB: 06-18-1941, 76 y.o.   MRN: [id number] Patient's only complaint is of headache this morning. She has a excellent thrill in her above incision upper arm. Antecubital wound healing. 2+ radial pulse. Will not follow actively. I will see her in the office in 4-6 weeks

## 2017-02-20 NOTE — Progress Notes (Signed)
Clarified the order with Dr. Juleen China re: transfusing a unit of PRBC, MD stated it's ok not to give pt the unit of blood today as Hgb is 7.4, will re-check CBC tomorrow morning.

## 2017-02-20 NOTE — Telephone Encounter (Signed)
LM w/ family member for appt 10/2, mailed lttr

## 2017-02-20 NOTE — Progress Notes (Signed)
Patient ID: Nancy [name], female   DOB: 1940-07-14, 76 y.o.   MRN: [id number] S:Refused dialysis this morning, "I was too weak" but  O:BP (!) 156/64 (BP Location: Right Arm)   Pulse 96   Temp 98.6 F (37 C) (Oral)   Resp 20   Ht 5\' 3"  (1.6 m)   Wt 58.8 kg (129 lb 10.1 oz)   SpO2 97%   BMI 22.96 kg/m   Intake/Output Summary (Last 24 hours) at 02/20/17 1004 Last data filed at 02/20/17 0625  Gross per 24 hour  Intake              360 ml  Output              160 ml  Net              200 ml   Intake/Output: I/O last 3 completed shifts: In: 360 [P.O.:60; I.V.:300] Out: 1260 [Urine:550; Stool:700; Blood:10]  Intake/Output this shift:  No intake/output data recorded. Weight change: -6.4 kg (-14 lb 1.8 oz) Gen: NAD CVS: no rub Resp: cta WCB:JSEGBT Ext: no edema, LUE +T/B   Recent Labs Lab 02/17/17 1832 02/17/17 1901 02/19/17 0703 02/20/17 0212  NA 142 143 144 141  K 4.9 4.8 5.2* 3.4*  CL 115* 118* 117* 109  CO2 11*  --  9* 20*  GLUCOSE 118* 115* 84 92  BUN 143* 133* 137* 63*  CREATININE 12.13* 13.20* 11.76* 7.03*  ALBUMIN 3.3*  --  3.1*  --   CALCIUM 8.3*  --  7.6* 7.0*  PHOS  --   --  8.3*  --   AST 7*  --   --   --   ALT 6*  --   --   --    Liver Function Tests:  Recent Labs Lab 02/17/17 1832 02/19/17 0703  AST 7*  --   ALT 6*  --   ALKPHOS 57  --   BILITOT 0.7  --   PROT 6.8  --   ALBUMIN 3.3* 3.1*   No results for input(s): LIPASE, AMYLASE in the last 168 hours. No results for input(s): AMMONIA in the last 168 hours. CBC:  Recent Labs Lab 02/17/17 1832  02/18/17 1456 02/19/17 0703 02/20/17 0212 02/20/17 0646  WBC 6.3  --  9.9 8.9 8.2  --   NEUTROABS 5.2  --   --   --   --   --   HGB 5.1*  < > 7.9* 8.0* 6.7* 7.4*  HCT 15.9*  < > 23.9* 24.8* 20.4* 22.6*  MCV 85.9  --  86.0 87.6 85.4  --   PLT 152  --  153 138* 106*  --   < > = values in this interval not displayed. Cardiac Enzymes: No results for input(s): CKTOTAL, CKMB, CKMBINDEX,  TROPONINI in the last 168 hours. CBG:  Recent Labs Lab 02/17/17 1834  GLUCAP 107*    Iron Studies: No results for input(s): IRON, TIBC, TRANSFERRIN, FERRITIN in the last 72 hours. Studies/Results: Dg Chest Port 1 View  Result Date: 02/19/2017 CLINICAL DATA:  Status post dialysis catheter placement. EXAM: PORTABLE CHEST 1 VIEW COMPARISON:  Radiograph of September 21, 2016. FINDINGS: Stable cardiomegaly. Atherosclerosis of thoracic aorta is noted. No pneumothorax or pleural effusion is noted. No acute pulmonary disease is noted. Interval placement of right internal jugular dialysis catheter with distal tip in expected position of cavoatrial junction. Bony thorax is unremarkable. IMPRESSION: Aortic atherosclerosis. Interval placement of right internal jugular  dialysis catheter with distal tip in expected position of cavoatrial junction. No pneumothorax is noted. Electronically Signed   By: Marijo Conception, M.D.   On: 02/19/2017 11:57   Dg Fluoro Guide Cv Line-no Report  Result Date: 02/19/2017 Fluoroscopy was utilized by the requesting physician.  No radiographic interpretation.   Marland Kitchen aspirin EC  81 mg Oral Daily  . calcitRIOL  0.25 mcg Oral Daily  . cinacalcet  60 mg Oral Q breakfast  . cloNIDine  0.2 mg Transdermal Weekly  . famotidine  20 mg Oral Daily  . heparin  5,000 Units Subcutaneous Q8H  . sodium bicarbonate  650 mg Oral BID    BMET    Component Value Date/Time   NA 141 02/20/2017 0212   NA 143 01/15/2017 1055   K 3.4 (L) 02/20/2017 0212   CL 109 02/20/2017 0212   CO2 20 (L) 02/20/2017 0212   GLUCOSE 92 02/20/2017 0212   BUN 63 (H) 02/20/2017 0212   BUN 109 (HH) 01/15/2017 1055   CREATININE 7.03 (H) 02/20/2017 0212   CREATININE 5.79 (H) 11/25/2014 1011   CALCIUM 7.0 (L) 02/20/2017 0212   GFRNONAA 5 (L) 02/20/2017 0212   GFRNONAA 9 (L) 02/09/2014 0909   GFRAA 6 (L) 02/20/2017 0212   GFRAA 10 (L) 02/09/2014 0909   CBC    Component Value Date/Time   WBC 8.2 02/20/2017  0212   RBC 2.39 (L) 02/20/2017 0212   HGB 7.4 (L) 02/20/2017 0646   HGB 8.3 (L) 01/15/2017 1055   HCT 22.6 (L) 02/20/2017 0646   HCT 26.5 (L) 01/15/2017 1055   PLT 106 (L) 02/20/2017 0212   PLT 160 01/15/2017 1055   MCV 85.4 02/20/2017 0212   MCV 86 01/15/2017 1055   MCH 28.0 02/20/2017 0212   MCHC 32.8 02/20/2017 0212   RDW 16.7 (H) 02/20/2017 0212   RDW 17.6 (H) 01/15/2017 1055   LYMPHSABS 0.7 02/17/2017 1832   MONOABS 0.3 02/17/2017 1832   EOSABS 0.1 02/17/2017 1832   BASOSABS 0.0 02/17/2017 1832    Assessment/Plan: 1. End stage renal disease with uremia- pt with asterixis, anorexia, profound anemia, and intermittent pericardial friction rub. I had a frank conversation with Nancy [name] regarding dialysis vs dying at home with hospice. She is now amenable to proceed with HD and understands that she has end stage renal disease.   1. S/p initial HD for second session today.  She is willing to go but later in he day.   2. Start CLIP process for outpatient dialysis spot. 2. Vascular access- appreciate VVS assistance.   1. s/p tunneled RIJ HD catheter and LUE AVF 02/19/17 3. Anemia of chronic kidney disease- s/p blood transufion and will start ESA with HD. 4. HTN- stable 5. Metabolic acidosis- plan for HD and can d/c sodium bicarb. 6. Right knee sprain 7. Secondary HPTH/Bone mineral metabolism- will check ca/phos/iPTH and restart sensipar, calcitriol, and renvela 8. Nutrition- will need renal diet and dietician education 9. Remote left cerebellar infarct- seen on CT  Donetta Potts, MD [facility] [phone]

## 2017-02-20 NOTE — Telephone Encounter (Signed)
-----   Message from Mena Goes, RN sent at 02/20/2017 11:25 AM EDT ----- Regarding: 4-6 weeks w/ duplex   ----- Message ----- From: Alvia Grove, PA-C Sent: 02/20/2017  11:11 AM To: Vvs Charge Pool  S/p left 1st stage BVT 02/19/17  F/u with Dr. Donnetta Hutching in 4-6 weeks with duplex  Thanks Maudie Mercury

## 2017-02-20 NOTE — Progress Notes (Signed)
Pt refused to go to HD today, stated "i'm not feeling good', Dr. Aggie Hacker and Dr. Marval Regal notified. Pt supposed to get a unit of PRBC, clarified with Dr. Aggie Hacker about the order and she wants to hold off for the transfusion for now. Hgb at 7.4. Will continue to monitor pt.

## 2017-02-20 NOTE — Progress Notes (Signed)
CRITICAL VALUE ALERT  Critical Value:  Hemoglobin 6.7  Date & Time Notied:  02/20/17 02:37  Provider Notified: Winfrey,MD @ 02:45  Orders Received/Actions taken: Order to transfuse 1 unit PRBC

## 2017-02-20 NOTE — Progress Notes (Signed)
RN went to check patient' blood bank arm band to pick up blood   but patient does not have a blood bank arm band. Called blood bank and was informed patient needs to be re type and screened. MD on call made aware. Order received. Garrit Marrow, Wonda Cheng, Therapist, sports

## 2017-02-20 NOTE — Progress Notes (Signed)
Internal Medicine Attending:   I saw and examined the patient. I reviewed the resident's note and I agree with the resident's findings and plan as documented in the resident's note.  76 year old woman hospital day #3 with end-stage renal disease and is a new start hemodialysis. The patient received a right tunneled IJ HD line yesterday and placement of a left upper extremity AV fistula. She received her first HD session yesterday, says that the session was difficult for her. This morning she reports having a headache and declined to go to HD during the early morning session. I think her symptoms are still coming from uremia and I encouraged her to go later today. She may be more amenable. We will keep her in house for HD until an outpatient HD center is arranged.

## 2017-02-20 NOTE — Progress Notes (Signed)
   Subjective: Patient seen and examined. Night team was called for low hemoglobin at 6.7 and put in order to transfuse 1 unit of PRBCs. She did not receive this unit. She declined dialysis treatment this morning because of a diffuse headache. She stated dialysis made her feel worse and she was experiencing nausea, with 1 episode of vomiting overnight. She agreed that if she felt better later in the day she would go to dialysis.   Objective:  Vital signs in last 24 hours: Vitals:   02/19/17 1812 02/19/17 2034 02/20/17 0625 02/20/17 1019  BP: (!) 156/83 (!) 143/63 (!) 156/64 (!) 153/67  Pulse: (!) 107 (!) 110 96 95  Resp:  20 20 20   Temp: 97.8 F (36.6 C) 98.9 F (37.2 C) 98.6 F (37 C) 98.5 F (36.9 C)  TempSrc: Oral Oral Oral Oral  SpO2: 99% 97% 97% 98%  Weight: 129 lb 10.1 oz (58.8 kg) 129 lb 10.1 oz (58.8 kg)    Height:       General: Laying in bed comfortably, NAD HEENT: [place]/AT, EOMI, no scleral icterus, +clean, dry bandages over right IJ HD catheter area TTP Cardiac: RRR, 5/6 systolic murmur  Pulm: normal effort, CTAB Abd: soft, non tender, non distended, BS normal Ext: clean and dry post surgical incision near left cubital fossa no erythema or drainage, overall extremities well perfused, no peripheral edema Neuro: alert and oriented X3, cranial nerves II-XII grossly intact   Assessment/Plan:  Principal Problem:   Symptomatic anemia Active Problems:   Essential hypertension   History of CVA (cerebrovascular accident)   GERD   Osteoarthrosis involving lower leg   CKD (chronic kidney disease) stage 5, GFR less than 15 ml/min (HCC)  ESRD, Anemia of Chronic Disease S/p one session of HD 8/15. Pt did not tolerate very well with nausea and vomiting afterward. Plan for second HD today. Hgb 7.4 after transfusion of 2 units. ESA dose given in HD 02/19/2017.  -Nephrology following and is initiating CLIP process, appreciate recs -Restarted calcitriol, sensipar -IV Zofran q6 PRN  for nausea -BMET, CBC in AM  Headache -Tylenol PRN   HTN BP stable at 153/74 today.  -Continue Clonidine patch   Dispo: Anticipated discharge in approximately 1-2 day(s).   Melanee Spry, MD 02/20/2017, 1:03 PM Pager: [phone]

## 2017-02-21 LAB — BASIC METABOLIC PANEL
ANION GAP: 10 (ref 5–15)
BUN: 27 mg/dL — ABNORMAL HIGH (ref 6–20)
CO2: 25 mmol/L (ref 22–32)
Calcium: 6.8 mg/dL — ABNORMAL LOW (ref 8.9–10.3)
Chloride: 103 mmol/L (ref 101–111)
Creatinine, Ser: 4.29 mg/dL — ABNORMAL HIGH (ref 0.44–1.00)
GFR calc Af Amer: 11 mL/min — ABNORMAL LOW (ref >60)
GFR, EST NON AFRICAN AMERICAN: 9 mL/min — AB (ref >60)
GLUCOSE: 93 mg/dL (ref 65–99)
POTASSIUM: 3.8 mmol/L (ref 3.5–5.1)
Sodium: 138 mmol/L (ref 135–145)

## 2017-02-21 LAB — SYNOVIAL CELL COUNT + DIFF, W/ CRYSTALS
Crystals, Fluid: NONE SEEN
EOSINOPHILS-SYNOVIAL: 1 % (ref 0–1)
LYMPHOCYTES-SYNOVIAL FLD: 15 % (ref 0–20)
Monocyte-Macrophage-Synovial Fluid: 67 % (ref 50–90)
NEUTROPHIL, SYNOVIAL: 17 % (ref 0–25)
WBC, Synovial: 19 /mm3 (ref 0–200)

## 2017-02-21 LAB — CBC
HEMATOCRIT: 20.8 % — AB (ref 36.0–46.0)
HEMOGLOBIN: 6.8 g/dL — AB (ref 12.0–15.0)
MCH: 28.1 pg (ref 26.0–34.0)
MCHC: 32.7 g/dL (ref 30.0–36.0)
MCV: 86 fL (ref 78.0–100.0)
Platelets: 103 10*3/uL — ABNORMAL LOW (ref 150–400)
RBC: 2.42 MIL/uL — ABNORMAL LOW (ref 3.87–5.11)
RDW: 16.2 % — ABNORMAL HIGH (ref 11.5–15.5)
WBC: 6.8 10*3/uL (ref 4.0–10.5)

## 2017-02-21 LAB — GRAM STAIN: Gram Stain: NONE SEEN

## 2017-02-21 LAB — PREPARE RBC (CROSSMATCH)

## 2017-02-21 LAB — HEMOGLOBIN AND HEMATOCRIT, BLOOD
HCT: 25.4 % — ABNORMAL LOW (ref 36.0–46.0)
Hemoglobin: 8.5 g/dL — ABNORMAL LOW (ref 12.0–15.0)

## 2017-02-21 MED ORDER — SODIUM CHLORIDE 0.9 % IV SOLN: Freq: Once | INTRAVENOUS | Status: DC

## 2017-02-21 MED ORDER — CALCIUM ACETATE (PHOS BINDER) 667 MG PO CAPS
1334.0000 mg | ORAL_CAPSULE | Freq: Three times a day (TID) | ORAL | Status: DC
  Administered 2017-02-21 – 2017-02-23 (×3): 1334 mg via ORAL
  Filled 2017-02-21 (×4): qty 2

## 2017-02-21 NOTE — Progress Notes (Addendum)
Knee Arthrocentesis with Injection Procedure Note  Diagnosis: right knee effusion  Indications: Symptomatic relief of large effusion  Anesthesia: Lidocaine 1% without epinephrine  Procedure Details   Point of care ultrasound was used to identify the joint effusion and plan needle trajectory and depth. Consent was obtained for the procedure. The joint was prepped with Betadine. A 22 gauge needle was inserted into the superior aspect of the joint from a medial approach to access the suprapatellar pouch. 40 ml of clear yellow fluid with small amount of blood was removed from the joint and sent to the lab for analysis. 3 ml 1% lidocaine and 1 ml of Triamcinolone was then injected into the joint through the same needle. The needle was removed and the area cleansed and dressed.  Complications:  None; patient tolerated the procedure well.

## 2017-02-21 NOTE — Progress Notes (Signed)
   Subjective: Patient seen and examined. Night team was called for low hemoglobin at 6.8 and put in order to transfuse 1 unit of PRBCs. She has not received the unit yet. Per patient and nursing she had two episodes of vomiting overnight and is not tolerating food or pills. This morning she is complaining of headache and tingling in her hands. She denies weakness or numbness. She denies shortness of breath.   Objective:  Vital signs in last 24 hours: Vitals:   02/20/17 1813 02/20/17 2153 02/21/17 0600 02/21/17 0846  BP: (!) 153/67 (!) 160/71 (!) 152/68 (!) 143/63  Pulse: 95 99 87 86  Resp: 16 17 17 17   Temp: 98.1 F (36.7 C) 98.5 F (36.9 C) 97.9 F (36.6 C) 98.1 F (36.7 C)  TempSrc: Oral Oral Oral Oral  SpO2: 99% 99% 98% 97%  Weight:  126 lb 12.2 oz (57.5 kg)    Height:       General: Laying in bed comfortably, NAD HEENT: [place]/AT, EOMI, no scleral icterus, +right IJ HD catheter  Cardiac: RRR, 5/6 systolic murmur  Pulm: normal effort, CTAB Abd: soft, non tender, non distended, BS normal Ext: clean and dry post surgical incision near left cubital fossa no erythema or drainage, overall extremities well perfused, no peripheral edema MSK:  Hands: no erythema or swelling in MCP or PIPs, no TTP, strength 5/5, ROM intact +Right knee swelling, non erythematous, ROM intact Neuro: alert and oriented X3, cranial nerves II-XII grossly intact, 5/5 strength in upper and lower extremities   Assessment/Plan:  Principal Problem:   Symptomatic anemia Active Problems:   Essential hypertension   History of CVA (cerebrovascular accident)   GERD   Osteoarthrosis involving lower leg   CKD (chronic kidney disease) stage 5, GFR less than 15 ml/min (HCC)  ESRD, Anemia of Chronic Disease S/p two sessions of HD. Pt continues to have nausea and vomiting afterward HD sessions.  Hgb 6.8. ESA dose given in HD 02/19/2017.  -Plan to transfuse one unit PRBCs today -Third Dialysis session planned for  02/22/2017 -CLIP process complete, set up for [facility] qMWF. -Per Nephrology pt is okay to discharge after third dialysis session tomorrow -Restarted calcitriol, sensipar -IV Zofran q6 PRN for nausea -BMET, CBC in AM  Right knee pain Effusion noted on exam and bedside ultrasound. Most likely traumatic knee effusion after patient's fall.  -Plan for U/S guided arthrocentesis today with possible steroid injection following the procedure   Headache -Tylenol PRN   HTN BP stable at 143/63 today.  -Continue Clonidine patch   Dispo: Anticipated discharge in approximately 1 day.   Melanee Spry, MD 02/21/2017, 12:29 PM Pager: [phone]

## 2017-02-21 NOTE — Progress Notes (Addendum)
Patient ID: Nancy [name], female   DOB: 09/04/1940, 76 y.o.   MRN: [id number] S:Feels better but weak O:BP (!) 143/63 (BP Location: Right Arm)   Pulse 86   Temp 98.1 F (36.7 C) (Oral)   Resp 17   Ht 5\' 3"  (1.6 m)   Wt 57.5 kg (126 lb 12.2 oz)   SpO2 97%   BMI 22.46 kg/m   Intake/Output Summary (Last 24 hours) at 02/21/17 1205 Last data filed at 02/21/17 1000  Gross per 24 hour  Intake              280 ml  Output                0 ml  Net              280 ml   Intake/Output: I/O last 3 completed shifts: In: 280 [P.O.:280] Out: 0   Intake/Output this shift:  Total I/O In: 120 [P.O.:120] Out: 0  Weight change: -1.3 kg (-2 lb 13.9 oz) Gen:NAD CVS:no rub, III/VI SEM Resp: CTA NOI:BBCWUG Ext: LUE AVF +T/B, no edema   Recent Labs Lab 02/17/17 1832 02/17/17 1901 02/19/17 0703 02/20/17 0212 02/21/17 0344  NA 142 143 144 141 138  K 4.9 4.8 5.2* 3.4* 3.8  CL 115* 118* 117* 109 103  CO2 11*  --  9* 20* 25  GLUCOSE 118* 115* 84 92 93  BUN 143* 133* 137* 63* 27*  CREATININE 12.13* 13.20* 11.76* 7.03* 4.29*  ALBUMIN 3.3*  --  3.1*  --   --   CALCIUM 8.3*  --  7.6* 7.0* 6.8*  PHOS  --   --  8.3*  --   --   AST 7*  --   --   --   --   ALT 6*  --   --   --   --    Liver Function Tests:  Recent Labs Lab 02/17/17 1832 02/19/17 0703  AST 7*  --   ALT 6*  --   ALKPHOS 57  --   BILITOT 0.7  --   PROT 6.8  --   ALBUMIN 3.3* 3.1*   No results for input(s): LIPASE, AMYLASE in the last 168 hours. No results for input(s): AMMONIA in the last 168 hours. CBC:  Recent Labs Lab 02/17/17 1832  02/18/17 1456 02/19/17 0703 02/20/17 0212 02/20/17 0646 02/21/17 0344  WBC 6.3  --  9.9 8.9 8.2  --  6.8  NEUTROABS 5.2  --   --   --   --   --   --   HGB 5.1*  < > 7.9* 8.0* 6.7* 7.4* 6.8*  HCT 15.9*  < > 23.9* 24.8* 20.4* 22.6* 20.8*  MCV 85.9  --  86.0 87.6 85.4  --  86.0  PLT 152  --  153 138* 106*  --  103*  < > = values in this interval not displayed. Cardiac  Enzymes: No results for input(s): CKTOTAL, CKMB, CKMBINDEX, TROPONINI in the last 168 hours. CBG:  Recent Labs Lab 02/17/17 1834 02/20/17 2151  GLUCAP 107* 84    Iron Studies: No results for input(s): IRON, TIBC, TRANSFERRIN, FERRITIN in the last 72 hours. Studies/Results: No results found. Marland Kitchen aspirin EC  81 mg Oral Daily  . calcitRIOL  0.25 mcg Oral Daily  . cinacalcet  60 mg Oral Q breakfast  . cloNIDine  0.2 mg Transdermal Weekly  . famotidine  20 mg Oral Daily  . heparin  5,000 Units Subcutaneous Q8H  . sodium bicarbonate  650 mg Oral BID    BMET    Component Value Date/Time   NA 138 02/21/2017 0344   NA 143 01/15/2017 1055   K 3.8 02/21/2017 0344   CL 103 02/21/2017 0344   CO2 25 02/21/2017 0344   GLUCOSE 93 02/21/2017 0344   BUN 27 (H) 02/21/2017 0344   BUN 109 (HH) 01/15/2017 1055   CREATININE 4.29 (H) 02/21/2017 0344   CREATININE 5.79 (H) 11/25/2014 1011   CALCIUM 6.8 (L) 02/21/2017 0344   GFRNONAA 9 (L) 02/21/2017 0344   GFRNONAA 9 (L) 02/09/2014 0909   GFRAA 11 (L) 02/21/2017 0344   GFRAA 10 (L) 02/09/2014 0909   CBC    Component Value Date/Time   WBC 6.8 02/21/2017 0344   RBC 2.42 (L) 02/21/2017 0344   HGB 6.8 (LL) 02/21/2017 0344   HGB 8.3 (L) 01/15/2017 1055   HCT 20.8 (L) 02/21/2017 0344   HCT 26.5 (L) 01/15/2017 1055   PLT 103 (L) 02/21/2017 0344   PLT 160 01/15/2017 1055   MCV 86.0 02/21/2017 0344   MCV 86 01/15/2017 1055   MCH 28.1 02/21/2017 0344   MCHC 32.7 02/21/2017 0344   RDW 16.2 (H) 02/21/2017 0344   RDW 17.6 (H) 01/15/2017 1055   LYMPHSABS 0.7 02/17/2017 1832   MONOABS 0.3 02/17/2017 1832   EOSABS 0.1 02/17/2017 1832   BASOSABS 0.0 02/17/2017 1832     Assessment/Plan: 1. End stage renal disease with uremia- pt with asterixis, anorexia, profound anemia, and intermittent pericardial friction rub. I had a frank conversation with Nancy [name] regarding dialysis vs dying at home with hospice. She is now amenable to proceed with HD  and understands that she has end stage renal disease.  1. S/p second session 02/20/17.   2. She would like to wait until tomorrow for her 3rd treatment. 3. She is set up for [facility] qMWF. 4. Plan for HD tomorrow and then ok for discharge to hometart CLIP process for outpatient dialysis spot. 2. Vascular access- appreciate VVS assistance.  1. s/ptunneled RIJ HD catheter and LUE AVF 02/19/17 3. Anemia of chronic kidney disease- s/p blood transufion and will start ESA with HD. 1. Agree with blood transfusion and follow 4. HTN- stable 5. Metabolic acidosis- plan for HD and can d/c sodium bicarb. 6. Right knee sprain 7. Secondary HPTH/Bone mineral metabolism- will check ca/phos/iPTH and restart sensipar, calcitriol, and change renvela to phoslo due to hypocalcemia. 8. Nutrition- will need renal diet and dietician education 9. Remote left cerebellar infarct- seen on CT 10. Disposition- should be stable for discharge to home tomorrow after HD and f/u at [facility] on Monday.  Donetta Potts, MD [employer] [phone]

## 2017-02-21 NOTE — Consult Note (Signed)
   [facility] Inpatient Consult   02/21/2017  [place] B [place]burn 1940-10-06 [id number]    Spoke with Mrs. Marrs at bedside. She has been followed by [facility] but was discharged by [facility] for non adherence of treatment plan and medications. Please see chart review then encounters for further patient outreach details.  Noted [facility] LCSW has been following for community resources for agencies that do house work.  Spoke with patient and sister at bedside. Discussed that [facility] LCSW has attempted to reach her to find out whether she received the mailed resources for housekeeping and personal care services. Mrs. Selkirk reports her phone line had been cut and since has been restored. States she has not received the information yet. Asked if the resources could be mailed again. Of note, she has vision problems. Mr. Tep not at bedside.   Will request [facility] LCSW to follow up with husband regarding receipt of mailed information.  Mrs. Buccellato endorses that she does not wish to engage with [facility] program beyond community resource information.  Made inpatient RNCM aware.  Will make [place] LCSW aware of bedside conversation with [name].  Provided [facility] Care Management contact information and 24-hr nurse line magnet.   Marthenia Rolling, MSN-Ed, RN,BSN [facility] Liaison [phone]

## 2017-02-21 NOTE — Progress Notes (Signed)
CRITICAL VALUE ALERT  Critical Value:  Hemoglobin 6.8  Date & Time Notied:  02/21/17  04:29  Provider Notified: Melvin,MD @ 04:35  Orders Received/Actions taken: Orders received in epic

## 2017-02-21 NOTE — Progress Notes (Signed)
PT Cancellation Note  Patient Details Name: Nancy [name] MRN: [id number] DOB: 02-28-1941   Cancelled Treatment:    Reason Eval/Treat Not Completed: Medical issues which prohibited therapy. Per orders, pt is still on strict bedrest and continues to have HGB below 7. Per RN, pt going for dialysis today. Requests PT hold off on evaluation today. Will check back as time allows, most likely tomorrow due to current status.    Scheryl Marten PT, DPT  [phone]  02/21/2017, 8:45 AM

## 2017-02-21 NOTE — Progress Notes (Signed)
Internal Medicine Attending:   I saw and examined the patient. I reviewed the resident's note and I agree with the resident's findings and plan as documented in the resident's note.  76 year old woman who is admitted for end-stage renal disease and new start hemodialysis. She has received 2 sessions of HD and is tolerating it fine. Her current access is a right tunneled HD IJ catheter. She had a left upper charity AV fistula placed during this admission as well. Her oral intake is still low, still having nausea. Her right knee remains swollen, there is no joint laxity, negative Lockman's, so I doubt ligament injury. I think she clearly does have underlying osteoarthritis which is probably inflamed due to the fall. On point care ultrasound she has a moderate-sized suprapatellar knee effusion, we decided to do an arthrocentesis today for symptomatic relief. Plan is to receive 1 more HD session tomorrow, then she'll be ready for discharge to home and will follow-up with an outpatient dialysis center starting Monday.

## 2017-02-21 NOTE — Care Management Important Message (Signed)
Important Message  Patient Details  Name: Nancy [name] MRN: [id number] Date of Birth: [birth year]   Medicare Important Message Given:  Yes    Nathen May 02/21/2017, 10:38 AM

## 2017-02-22 DIAGNOSIS — K219 Gastro-esophageal reflux disease without esophagitis: Secondary | ICD-10-CM

## 2017-02-22 DIAGNOSIS — N179 Acute kidney failure, unspecified: Secondary | ICD-10-CM

## 2017-02-22 DIAGNOSIS — Z9181 History of falling: Secondary | ICD-10-CM

## 2017-02-22 DIAGNOSIS — Z885 Allergy status to narcotic agent status: Secondary | ICD-10-CM

## 2017-02-22 DIAGNOSIS — N186 End stage renal disease: Secondary | ICD-10-CM

## 2017-02-22 DIAGNOSIS — M25461 Effusion, right knee: Secondary | ICD-10-CM

## 2017-02-22 DIAGNOSIS — M1711 Unilateral primary osteoarthritis, right knee: Secondary | ICD-10-CM

## 2017-02-22 DIAGNOSIS — Z9889 Other specified postprocedural states: Secondary | ICD-10-CM

## 2017-02-22 DIAGNOSIS — Z992 Dependence on renal dialysis: Secondary | ICD-10-CM

## 2017-02-22 LAB — TYPE AND SCREEN
ABO/RH(D): A POS
ANTIBODY SCREEN: NEGATIVE
UNIT DIVISION: 0

## 2017-02-22 LAB — BASIC METABOLIC PANEL
ANION GAP: 14 (ref 5–15)
BUN: 39 mg/dL — AB (ref 6–20)
CHLORIDE: 104 mmol/L (ref 101–111)
CO2: 20 mmol/L — ABNORMAL LOW (ref 22–32)
Calcium: 6.3 mg/dL — CL (ref 8.9–10.3)
Creatinine, Ser: 5.56 mg/dL — ABNORMAL HIGH (ref 0.44–1.00)
GFR calc Af Amer: 8 mL/min — ABNORMAL LOW (ref >60)
GFR calc non Af Amer: 7 mL/min — ABNORMAL LOW (ref >60)
GLUCOSE: 105 mg/dL — AB (ref 65–99)
POTASSIUM: 4.3 mmol/L (ref 3.5–5.1)
SODIUM: 138 mmol/L (ref 135–145)

## 2017-02-22 LAB — CBC
HCT: 25.8 % — ABNORMAL LOW (ref 36.0–46.0)
Hemoglobin: 8.5 g/dL — ABNORMAL LOW (ref 12.0–15.0)
MCH: 28.6 pg (ref 26.0–34.0)
MCHC: 32.9 g/dL (ref 30.0–36.0)
MCV: 86.9 fL (ref 78.0–100.0)
PLATELETS: 117 10*3/uL — AB (ref 150–400)
RBC: 2.97 MIL/uL — AB (ref 3.87–5.11)
RDW: 15.6 % — AB (ref 11.5–15.5)
WBC: 7.7 10*3/uL (ref 4.0–10.5)

## 2017-02-22 LAB — BPAM RBC
Blood Product Expiration Date: 201809032359
ISSUE DATE / TIME: 201808171216
Unit Type and Rh: 6200

## 2017-02-22 MED ORDER — CALCIUM ACETATE (PHOS BINDER) 667 MG PO CAPS: 1334.0000 mg | ORAL_CAPSULE | Freq: Three times a day (TID) | ORAL | 1 refills | Status: DC

## 2017-02-22 MED ORDER — CALCITRIOL 0.25 MCG PO CAPS: 0.2500 ug | ORAL_CAPSULE | Freq: Every day | ORAL | 0 refills | Status: DC

## 2017-02-22 MED ORDER — RAMELTEON 8 MG PO TABS: 8.0000 mg | ORAL_TABLET | Freq: Once | ORAL | Status: DC

## 2017-02-22 MED ORDER — RAMELTEON 8 MG PO TABS
8.0000 mg | ORAL_TABLET | Freq: Once | ORAL | Status: DC | PRN
  Filled 2017-02-22: qty 1

## 2017-02-22 NOTE — Progress Notes (Signed)
Patient ID: Nancy [name], female   DOB: 1940/08/11, 76 y.o.   MRN: [id number] S: states that she was given something today and feels terrible O:BP (!) 175/76 (BP Location: Right Arm)   Pulse 85   Temp (!) 97.5 F (36.4 C) (Oral)   Resp 16   Ht 5\' 3"  (1.6 m)   Wt 59.5 kg (131 lb 2.8 oz)   SpO2 96%   BMI 23.24 kg/m   Intake/Output Summary (Last 24 hours) at 02/22/17 1233 Last data filed at 02/22/17 0900  Gross per 24 hour  Intake             1015 ml  Output              100 ml  Net              915 ml   Intake/Output: I/O last 3 completed shifts: In: 1135 [P.O.:760; Blood:375] Out: 100 [Urine:100]  Intake/Output this shift:  Total I/O In: 60 [P.O.:60] Out: -  Weight change: 0.7 kg (1 lb 8.7 oz) Gen: NAD CVS: nor rub Resp:cta XFG:HWEXHB Ext: no edema, LUE AVF +T/B   Recent Labs Lab 02/17/17 1832 02/17/17 1901 02/19/17 0703 02/20/17 0212 02/21/17 0344 02/22/17 0312  NA 142 143 144 141 138 138  K 4.9 4.8 5.2* 3.4* 3.8 4.3  CL 115* 118* 117* 109 103 104  CO2 11*  --  9* 20* 25 20*  GLUCOSE 118* 115* 84 92 93 105*  BUN 143* 133* 137* 63* 27* 39*  CREATININE 12.13* 13.20* 11.76* 7.03* 4.29* 5.56*  ALBUMIN 3.3*  --  3.1*  --   --   --   CALCIUM 8.3*  --  7.6* 7.0* 6.8* 6.3*  PHOS  --   --  8.3*  --   --   --   AST 7*  --   --   --   --   --   ALT 6*  --   --   --   --   --    Liver Function Tests:  Recent Labs Lab 02/17/17 1832 02/19/17 0703  AST 7*  --   ALT 6*  --   ALKPHOS 57  --   BILITOT 0.7  --   PROT 6.8  --   ALBUMIN 3.3* 3.1*   No results for input(s): LIPASE, AMYLASE in the last 168 hours. No results for input(s): AMMONIA in the last 168 hours. CBC:  Recent Labs Lab 02/17/17 1832  02/18/17 1456 02/19/17 0703 02/20/17 0212  02/21/17 0344 02/21/17 1812 02/22/17 0312  WBC 6.3  --  9.9 8.9 8.2  --  6.8  --  7.7  NEUTROABS 5.2  --   --   --   --   --   --   --   --   HGB 5.1*  < > 7.9* 8.0* 6.7*  < > 6.8* 8.5* 8.5*  HCT 15.9*  < >  23.9* 24.8* 20.4*  < > 20.8* 25.4* 25.8*  MCV 85.9  --  86.0 87.6 85.4  --  86.0  --  86.9  PLT 152  --  153 138* 106*  --  103*  --  117*  < > = values in this interval not displayed. Cardiac Enzymes: No results for input(s): CKTOTAL, CKMB, CKMBINDEX, TROPONINI in the last 168 hours. CBG:  Recent Labs Lab 02/17/17 1834 02/20/17 2151  GLUCAP 107* 84    Iron Studies: No results for input(s): IRON, TIBC,  TRANSFERRIN, FERRITIN in the last 72 hours. Studies/Results: No results found. Marland Kitchen aspirin EC  81 mg Oral Daily  . calcitRIOL  0.25 mcg Oral Daily  . calcium acetate  1,334 mg Oral TID WC  . cinacalcet  60 mg Oral Q breakfast  . cloNIDine  0.2 mg Transdermal Weekly  . famotidine  20 mg Oral Daily  . heparin  5,000 Units Subcutaneous Q8H  . sodium bicarbonate  650 mg Oral BID    BMET    Component Value Date/Time   NA 138 02/22/2017 0312   NA 143 01/15/2017 1055   K 4.3 02/22/2017 0312   CL 104 02/22/2017 0312   CO2 20 (L) 02/22/2017 0312   GLUCOSE 105 (H) 02/22/2017 0312   BUN 39 (H) 02/22/2017 0312   BUN 109 (HH) 01/15/2017 1055   CREATININE 5.56 (H) 02/22/2017 0312   CREATININE 5.79 (H) 11/25/2014 1011   CALCIUM 6.3 (LL) 02/22/2017 0312   GFRNONAA 7 (L) 02/22/2017 0312   GFRNONAA 9 (L) 02/09/2014 0909   GFRAA 8 (L) 02/22/2017 0312   GFRAA 10 (L) 02/09/2014 0909   CBC    Component Value Date/Time   WBC 7.7 02/22/2017 0312   RBC 2.97 (L) 02/22/2017 0312   HGB 8.5 (L) 02/22/2017 0312   HGB 8.3 (L) 01/15/2017 1055   HCT 25.8 (L) 02/22/2017 0312   HCT 26.5 (L) 01/15/2017 1055   PLT 117 (L) 02/22/2017 0312   PLT 160 01/15/2017 1055   MCV 86.9 02/22/2017 0312   MCV 86 01/15/2017 1055   MCH 28.6 02/22/2017 0312   MCHC 32.9 02/22/2017 0312   RDW 15.6 (H) 02/22/2017 0312   RDW 17.6 (H) 01/15/2017 1055   LYMPHSABS 0.7 02/17/2017 1832   MONOABS 0.3 02/17/2017 1832   EOSABS 0.1 02/17/2017 1832   BASOSABS 0.0 02/17/2017 1832     Assessment/Plan: 1. End stage  renal disease with uremia- pt with asterixis, anorexia, profound anemia, and intermittent pericardial friction rub. I had a frank conversation with Nancy [name] regarding dialysis vs dying at home with hospice. She is now amenable to proceed with HD and understands that she has end stage renal disease.  1. S/p second session 02/20/17.  2. For her 3rd treatment today. 3. She is set up for [facility] qMWF. 4. Plan for HD today and then ok for discharge to home 5. Outpatient dialysis spot set up at [facility] qMWF. 2. Vascular access- appreciate VVS assistance.  1. s/ptunneled RIJ HD catheter and LUE AVF 02/19/17 3. Anemia of chronic kidney disease- s/p blood transufion and will start ESA with HD. 1. Agree with blood transfusion and follow 4. HTN- stable 5. Metabolic acidosis- plan for HD and can d/c sodium bicarb. 6. Right knee sprain 7. Secondary HPTH/Bone mineral metabolism- will check ca/phos/iPTH and restart sensipar, calcitriol, and change renvela to phoslo due to hypocalcemia. 8. Nutrition- will need renal diet and dietician education 9. Remote left cerebellar infarct- seen on CT 10. Disposition- should be stable for discharge to home after HD and f/u at [facility] on Monday.   Donetta Potts, MD [employer] [phone]

## 2017-02-22 NOTE — Progress Notes (Signed)
Internal Medicine Attending  Date: 02/22/2017  Patient name: Nancy [name] Medical record number: [id number] Date of birth: 05/03/1941 Age: 76 y.o. Gender: female  I saw and evaluated the patient. I reviewed the resident's note by Dr. Aggie Hacker and I agree with the resident's findings and plans as documented in her progress note.  When seen on rounds this morning Nancy [name] was without acute complaints. She stated her right knee felt much improved. On examination the right knee was without any erythema, warmth, or gross effusion. Chronic hemodialysis in the community has been arranged and she will be discharged home today.

## 2017-02-22 NOTE — Progress Notes (Signed)
   Subjective: Patient seen and examined.She had no episodes of nausea or vomiting overnight, but continues to have decreased appetite. She states her knee pain is much improved after the procedure.   Objective:  Vital signs in last 24 hours: Vitals:   02/21/17 1540 02/21/17 2150 02/22/17 0500 02/22/17 0948  BP: (!) 154/66 (!) 156/77 (!) 151/71 (!) 175/76  Pulse: 87 90 81 85  Resp: 17 16 17 16   Temp: 98.5 F (36.9 C) 99.1 F (37.3 C) 98.3 F (36.8 C) (!) 97.5 F (36.4 C)  TempSrc: Oral Oral Oral Oral  SpO2: 96% 98% 98% 96%  Weight:  128 lb 4.9 oz (58.2 kg) 131 lb 2.8 oz (59.5 kg)   Height:       General: Laying in bed comfortably, NAD HEENT: [place]/AT, EOMI, no scleral icterus, +right IJ HD catheter  Cardiac: RRR, 5/6 systolic murmur  Pulm: normal effort, CTAB Abd: soft, non tender, non distended, BS normal Ext: clean and dry post surgical incision near left cubital fossa no erythema or drainage, overall extremities well perfused, no peripheral edema MSK:  Right knee swelling/suprapatellar effusion improved  Neuro: alert and oriented X3, cranial nerves II-XII grossly intact  Assessment/Plan:  Principal Problem:   Symptomatic anemia Active Problems:   Essential hypertension   History of CVA (cerebrovascular accident)   GERD   Osteoarthrosis involving lower leg   CKD (chronic kidney disease) stage 5, GFR less than 15 ml/min (HCC)  ESRD, Anemia of Chronic Disease S/p two sessions of HD. Hgb 8.5 s/p 1 unit PRBCs 02/21/2017. ESA dose given in HD 02/19/2017.  -Third Dialysis session planned for today. -CLIP process complete, set up for [facility] qMWF. -Possible discharge after HD -PT/OT Eval -IV Zofran q6 PRN for nausea  Right knee pain Improved s/p US guided arthrocentesis and steroid injection. Synovial fluid analysis: negative gram stain, no crystals seen, cell count WNL.    HTN BP 175/76 today.  -Continue Clonidine patch -Monitor for improvement after dialysis    Dispo:  Anticipated discharge today.   Melanee Spry, MD 02/22/2017, 12:14 PM Pager: [phone]

## 2017-02-22 NOTE — Progress Notes (Signed)
CRITICAL VALUE ALERT  Critical Value:  Calcium 6.3  Date & Time Notied:  02/22/17 @ 04:19  Provider Notified: Melvin,MD  Orders Received/Actions taken: No orders received

## 2017-02-22 NOTE — Progress Notes (Signed)
Patient given IV dilaudid for severe right shoulder pain with subsequent weakness and nausea.  She has refused all further medication.  BP 181/79.  Dr. Aggie Hacker aware.

## 2017-02-22 NOTE — Evaluation (Signed)
Physical Therapy Evaluation Patient Details Name: Nancy [name] MRN: [id number] DOB: Jul 02, 1941 Today's Date: 02/22/2017   History of Present Illness  Pt is a 76 yo female admitted through ED on 02/17/17 with symptomatic anemia. Pt initially came to ED with R knee pain following a fall and was found to have low Hgb. Pt had fluid taken off her knee on 02/21/17 and had a fistula placed on 02/19/17.   Clinical Impression  Pt presents with the above diagnosis and below deficits for therapy evaluation. Prior to admission, pt lived with her husband in a single level apartment. Pt reports being completely independent prior to admission. Pt requires Min guard to Min A for all mobility this session and is moving very slowly with c/o dizziness due to recent administration of pain medication. Pt will benefit from continued PT follow-up acutely as well as from HHPT at discharge in order to maximize her outcomes.     Follow Up Recommendations Home health PT    Equipment Recommendations  None recommended by PT    Recommendations for Other Services       Precautions / Restrictions Precautions Precautions: Fall Restrictions Weight Bearing Restrictions: No      Mobility  Bed Mobility Overal bed mobility: Needs Assistance Bed Mobility: Supine to Sit;Sit to Supine     Supine to sit: Min guard Sit to supine: Min guard   General bed mobility comments: For safety  Transfers Overall transfer level: Needs assistance Equipment used: Rolling walker (2 wheeled) Transfers: Sit to/from Stand Sit to Stand: Min assist         General transfer comment: Min A for safety to stand from EOB to RW  Ambulation/Gait Ambulation/Gait assistance: Min assist Ambulation Distance (Feet): 20 Feet Assistive device: Rolling walker (2 wheeled) Gait Pattern/deviations: Step-through pattern;Decreased stride length Gait velocity: decreased Gait velocity interpretation: Below normal speed for age/gender General  Gait Details: slow cadence, decreased step and stride length bilaterally, minimal forward trunk lean  Stairs            Wheelchair Mobility    Modified Rankin (Stroke Patients Only)       Balance Overall balance assessment: Needs assistance Sitting-balance support: No upper extremity supported;Feet supported Sitting balance-Leahy Scale: Fair     Standing balance support: Bilateral upper extremity supported Standing balance-Leahy Scale: Poor Standing balance comment: reliant on UE support in standing                             Pertinent Vitals/Pain Pain Assessment: Faces Faces Pain Scale: Hurts a little bit Pain Location: right upper chest from fistula placement Pain Descriptors / Indicators: Grimacing Pain Intervention(s): Monitored during session;Premedicated before session;Repositioned    Home Living Family/patient expects to be discharged to:: Private residence Living Arrangements: Spouse/significant other Available Help at Discharge: Family;Personal care attendant;Other (Comment) (husband has Aide 2-3 hrs a day) Type of Home: Apartment Home Access: Level entry     Home Layout: One level Home Equipment: None      Prior Function Level of Independence: Independent         Comments: completely independent, doing all own bathing a dressing.      Hand Dominance   Dominant Hand: Right    Extremity/Trunk Assessment   Upper Extremity Assessment Upper Extremity Assessment: Defer to OT evaluation    Lower Extremity Assessment Lower Extremity Assessment: Generalized weakness       Communication   Communication: No difficulties  Cognition Arousal/Alertness: Awake/alert Behavior During Therapy: WFL for tasks assessed/performed Overall Cognitive Status: Within Functional Limits for tasks assessed                                        General Comments      Exercises     Assessment/Plan    PT Assessment Patient  needs continued PT services  PT Problem List Decreased strength;Decreased activity tolerance;Decreased balance;Decreased mobility;Decreased knowledge of use of DME       PT Treatment Interventions DME instruction;Gait training;Functional mobility training;Therapeutic activities;Therapeutic exercise;Balance training    PT Goals (Current goals can be found in the Care Plan section)  Acute Rehab PT Goals Patient Stated Goal: to go home PT Goal Formulation: With patient Time For Goal Achievement: 03/08/17 Potential to Achieve Goals: Good    Frequency Min 3X/week   Barriers to discharge        Co-evaluation               AM-PAC PT "6 Clicks" Daily Activity  Outcome Measure Difficulty turning over in bed (including adjusting bedclothes, sheets and blankets)?: None Difficulty moving from lying on back to sitting on the side of the bed? : A Little Difficulty sitting down on and standing up from a chair with arms (e.g., wheelchair, bedside commode, etc,.)?: Unable Help needed moving to and from a bed to chair (including a wheelchair)?: A Little Help needed walking in hospital room?: A Little Help needed climbing 3-5 steps with a railing? : A Little 6 Click Score: 17    End of Session Equipment Utilized During Treatment: Gait belt Activity Tolerance: Patient limited by fatigue Patient left: in bed;with call bell/phone within reach Nurse Communication: Mobility status PT Visit Diagnosis: Difficulty in walking, not elsewhere classified (R26.2);Muscle weakness (generalized) (M62.81)    Time: 6270-3500 PT Time Calculation (min) (ACUTE ONLY): 18 min   Charges:   PT Evaluation $PT Eval Moderate Complexity: 1 Mod     PT G Codes:        Scheryl Marten PT, DPT  [phone]   Shanon Rosser 02/22/2017, 1:14 PM

## 2017-02-22 NOTE — Discharge Summary (Signed)
Name: Nancy [name] MRN: [id number] DOB: September 04, 1940 76 y.o. PCP: Sid Falcon, MD  Date of Admission: 02/17/2017  8:31 PM Date of Discharge: 02/22/2017 Attending Physician: Axel Filler, *  Discharge Diagnosis: 1. End Stage Renal Disease Principal Problem:   End stage renal disease (HCC) Active Problems:   Essential hypertension   History of CVA (cerebrovascular accident)   GERD   Osteoarthrosis involving lower leg   Symptomatic anemia   Discharge Medications: Allergies as of 02/22/2017      Reactions   Hydrocodone-acetaminophen Nausea And Vomiting, Other (See Comments)   Dizziness (also)      Medication List    STOP taking these medications   sodium bicarbonate 650 MG tablet     TAKE these medications   acetaminophen 500 MG tablet Commonly known as:  TYLENOL Take 1,000 mg by mouth every 8 (eight) hours as needed for mild pain, moderate pain, fever or headache.   amLODipine 10 MG tablet Commonly known as:  NORVASC Take 10 mg by mouth daily.   aspirin 81 MG EC tablet Take 1 tablet (81 mg total) by mouth daily.   calcitRIOL 0.25 MCG capsule Commonly known as:  ROCALTROL Take 1 capsule (0.25 mcg total) by mouth daily. What changed:  Another medication with the same name was added. Make sure you understand how and when to take each.   calcitRIOL 0.25 MCG capsule Commonly known as:  ROCALTROL Take 1 capsule (0.25 mcg total) by mouth daily. What changed:  You were already taking a medication with the same name, and this prescription was added. Make sure you understand how and when to take each.   calcium acetate 667 MG capsule Commonly known as:  PHOSLO Take 2,001 mg by mouth 3 (three) times daily. What changed:  Another medication with the same name was added. Make sure you understand how and when to take each.   calcium acetate 667 MG capsule Commonly known as:  PHOSLO Take 2 capsules (1,334 mg total) by mouth 3 (three) times daily with  meals. What changed:  You were already taking a medication with the same name, and this prescription was added. Make sure you understand how and when to take each.   Capsaicin-Menthol-Methyl Sal 0.025-1-12 % Crea Commonly known as:  capsaicin-methyl sal-menthol Apply a small amount to area of knee pain daily.   carvedilol 12.5 MG tablet Commonly known as:  COREG Take 12.5 mg by mouth 2 (two) times daily.   cloNIDine 0.2 mg/24hr patch Commonly known as:  CATAPRES - Dosed in mg/24 hr Place 1 patch (0.2 mg total) onto the skin every 7 (seven) days.   famotidine 20 MG tablet Commonly known as:  PEPCID Take 1 tablet (20 mg total) by mouth 2 (two) times daily.   furosemide 40 MG tablet Commonly known as:  LASIX Take 40 mg by mouth daily.   nitroGLYCERIN 0.3 MG SL tablet Commonly known as:  NITROSTAT Place 1 tablet (0.3 mg total) under the tongue every 5 (five) minutes as needed for chest pain.   ondansetron 4 MG disintegrating tablet Commonly known as:  ZOFRAN-ODT Take 1 tablet (4 mg total) by mouth every 6 (six) hours as needed for nausea or vomiting.   SENSIPAR 60 MG tablet Generic drug:  cinacalcet Take 1 tablet (60 mg total) by mouth daily.   sevelamer carbonate 800 MG tablet Commonly known as:  RENVELA Take 800 mg by mouth 3 (three) times daily.       Disposition  and follow-up:   [name] was discharged from [facility] in Stable condition.  At the hospital follow up visit please address:  1.  Hemodialysis, medication adherence  2.  Labs / imaging needed at time of follow-up: CBC  3.  Pending labs/ test needing follow-up: None  Follow-up Appointments: Follow-up Information    Early, Arvilla Meres, MD In 6 weeks.   Specialties:  Vascular Surgery, Cardiology Why:  Our office will call you to arrange an appointment  Contact information: [place]  [id number] [place] [facility] Course by problem  list: Principal Problem:   End stage renal disease ([place]) Active Problems:   Essential hypertension   History of CVA (cerebrovascular accident)   GERD   Osteoarthrosis involving lower leg   Symptomatic anemia   1. End Stage Renal Disease, Normocytic Anemia  [name] was admitted to [facility] and the Internal Medicine Teaching service for symptomatic anemia and acute on chronic renal failure. Upon initial presentation her hemoglobin was found to be 5.4.  Her BUN and Creatinine were also elevated, 143 and 12.13, respectively.   During her hospitalization, she was transfused a total of 3 units of blood and discharged with a stable hemoglobin of 8.5. She also had a right internal jugular tunnel dialysis catheter placed for initiation of dialysis. She had vein mapping and a left AV fistula placed. She tolerated both procedures well. She completed a total of three hemodialysis sessions and was placed for outpatient dialysis with a schedule of Monday, Wednesday, and Friday.    2. Suprapatellar Effusion of Right knee, secondary to trauma [name] presented to the emergency department after falling on her right knee. Suprapatellar effusion was noted on examination and with point of care ultrasound. Arthrocentesis of her right knee was performed, followed by corticosteroid ejection. She tolerated the procedure well. Synovial fluid analysis was within normal limits, with no crystals or organisms on gram stain.   Discharge Vitals:   BP (!) (P) 177/86   Pulse (P) 83   Temp 98.7 F (37.1 C) (Oral)   Resp 16   Ht 5\' 3"  (1.6 m)   Wt (P) 125 lb (56.7 kg)   SpO2 (P) 99%   BMI (P) 22.14 kg/m   Pertinent Labs, Studies, and Procedures:  CBC Latest Ref Rng & Units 02/22/2017 02/21/2017 02/21/2017  WBC 4.0 - 10.5 K/uL 7.7 - 6.8  Hemoglobin 12.0 - 15.0 g/dL 8.5(L) 8.5(L) 6.8(LL)  Hematocrit 36.0 - 46.0 % 25.8(L) 25.4(L) 20.8(L)  Platelets 150 - 400 K/uL 117(L) - 103(L)   BMP Latest Ref Rng &  Units 02/22/2017 02/21/2017 02/20/2017  Glucose 65 - 99 mg/dL 105(H) 93 92  BUN 6 - 20 mg/dL 39(H) 27(H) 63(H)  Creatinine 0.44 - 1.00 mg/dL 5.56(H) 4.29(H) 7.03(H)  BUN/Creat Ratio 12 - 28 - - -  Sodium 135 - 145 mmol/L 138 138 141  Potassium 3.5 - 5.1 mmol/L 4.3 3.8 3.4(L)  Chloride 101 - 111 mmol/L 104 103 109  CO2 22 - 32 mmol/L 20(L) 25 20(L)  Calcium 8.9 - 10.3 mg/dL 6.3(LL) 6.8(L) 7.0(L)    Discharge Instructions: Discharge Instructions    Call MD for:  difficulty breathing, headache or visual disturbances    Complete by:  As directed    Call MD for:  extreme fatigue    Complete by:  As directed    Call MD for:  persistant dizziness or light-headedness  Complete by:  As directed    Call MD for:  persistant nausea and vomiting    Complete by:  As directed    Call MD for:  redness, tenderness, or signs of infection (pain, swelling, redness, odor or green/yellow discharge around incision site)    Complete by:  As directed    Diet - low sodium heart healthy    Complete by:  As directed    Discharge instructions    Complete by:  As directed    Patient can be discharged after Hemodialysis treatment.   [name], Nancy [name] were admitted for low blood count and poor kidney function. You received two procedures that prepped you for dialysis treatment, which went very well. You had three sessions of dialysis. You also had fluid removed from your right knee and received a steroid injection in that knee.   Please follow up with your primary care physican within a week of discharge. You are scheduled for dialysis treatments Monday, Wednesday, and Friday. Please attend these sessions.   Increase activity slowly    Complete by:  As directed       Signed: Melanee Spry, MD 02/22/2017, 6:33 PM   Pager: [phone]

## 2017-02-22 NOTE — Evaluation (Signed)
Occupational Therapy Evaluation Patient Details Name: Nancy [name] MRN: [id number] DOB: 12-20-1940 Today's Date: 02/22/2017    History of Present Illness Pt is a 76 yo female admitted through ED on 02/17/17 with symptomatic anemia. Pt initially came to ED with R knee pain following a fall and was found to have low Hgb. Pt had fluid taken off her knee on 02/21/17 and had a fistula placed on 02/19/17.    Clinical Impression   Pt admitted with the above diagnoses and presents with below problem list. Pt will benefit from continued acute OT to address the below listed deficits and maximize independence with basic ADLs prior to d/c home. 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 Pt is currently min guard to min A with LB ADLs and functional mobility/transfers. Of note, pt reporting increased lightheadedness with initiation of walking to bathroom. When asked, pt endorsed feeling like she could pass out. Ended session and returned pt to bed. VSS, assessed at end of session. Nursing notified.       Follow Up Recommendations  Home health OT;Supervision - Intermittent    Equipment Recommendations  3 in 1 bedside commode    Recommendations for Other Services       Precautions / Restrictions Precautions Precautions: Fall Restrictions Weight Bearing Restrictions: No      Mobility Bed Mobility Overal bed mobility: Needs Assistance Bed Mobility: Supine to Sit;Sit to Supine     Supine to sit: Min guard Sit to supine: Min guard   General bed mobility comments: For safety  Transfers Overall transfer level: Needs assistance Equipment used: Rolling walker (2 wheeled) Transfers: Sit to/from Stand Sit to Stand: Min assist         General transfer comment: Min A for safety to stand from EOB to RW    Balance Overall balance assessment: Needs assistance Sitting-balance support: No upper extremity supported;Feet supported Sitting balance-Leahy Scale:  Fair     Standing balance support: Bilateral upper extremity supported Standing balance-Leahy Scale: Poor Standing balance comment: reliant on UE support in standing                           ADL either performed or assessed with clinical judgement   ADL Overall ADL's : Needs assistance/impaired Eating/Feeding: Set up;Sitting   Grooming: Set up;Sitting   Upper Body Bathing: Set up;Sitting   Lower Body Bathing: Min guard;Sit to/from stand;Minimal assistance   Upper Body Dressing : Set up;Sitting   Lower Body Dressing: Min guard;Sit to/from stand;Minimal assistance Lower Body Dressing Details (indicate cue type and reason): pt donned socks EOB Toilet Transfer: Min Art therapist Details (indicate cue type and reason): only took a few steps away from bed before stating that she felt lightheaded and endorsed feeling like she could pass out.  Ended plan to walk to bathroom and returned to bed instead. Toileting- Water quality scientist and Hygiene: Min guard;Sit to/from stand   Tub/ Shower Transfer: Min guard;Tub transfer;Ambulation Tub/Shower Transfer Details (indicate cue type and reason): Talked at length about pt being a life-long tub bather "I don't feel clean unless I soak in the bathtub." Discussed fall prevention strategied including installation of vertical grab bar and nonslip pad for tub, having the ability to call for help if needed.  Functional mobility during ADLs: Min guard General ADL Comments: Pt completed bed mobility and took a few steps away from bedside with +1 HHA in lieu  of cane. Discussed fall prevention strategies and equipment recommendations.      Vision         Perception     Praxis      Pertinent Vitals/Pain Pain Assessment: Faces Faces Pain Scale: Hurts a little bit Pain Location: right upper chest from fistula placement Pain Descriptors / Indicators: Grimacing Pain Intervention(s): Limited activity within patient's  tolerance;Monitored during session;Repositioned     Hand Dominance Right   Extremity/Trunk Assessment Upper Extremity Assessment Upper Extremity Assessment: Generalized weakness;Overall [facility] for tasks assessed   Lower Extremity Assessment Lower Extremity Assessment: Defer to PT evaluation       Communication Communication Communication: No difficulties   Cognition Arousal/Alertness: Awake/alert Behavior During Therapy: WFL for tasks assessed/performed Overall Cognitive Status: Within Functional Limits for tasks assessed                                     General Comments  VSS, assessed at end of session    Exercises     Shoulder Instructions      Home Living Family/patient expects to be discharged to:: Private residence Living Arrangements: Spouse/significant other Available Help at Discharge: Family;Personal care attendant;Other (Comment) ((husband has Aide 2-3 hrs a day) Type of Home: Apartment Home Access: Level entry     Home Layout: One level     Bathroom Shower/Tub: Teacher, early years/pre: Standard     Home Equipment: None   Additional Comments: uses cane intermittently; takes tub baths      Prior Functioning/Environment Level of Independence: Independent        Comments: completely independent, doing all own bathing a dressing.         OT Problem List: Decreased strength;Decreased activity tolerance;Impaired balance (sitting and/or standing);Decreased knowledge of use of DME or AE;Decreased knowledge of precautions;Pain      OT Treatment/Interventions: Self-care/ADL training;Energy conservation;DME and/or AE instruction;Therapeutic activities;Patient/family education;Balance training    OT Goals(Current goals can be found in the care plan section) Acute Rehab OT Goals Patient Stated Goal: to go home OT Goal Formulation: With patient Time For Goal Achievement: 03/08/17 Potential to Achieve Goals: Good ADL Goals Pt  Will Perform Grooming: with modified independence;standing Pt Will Perform Lower Body Bathing: with modified independence;sit to/from stand Pt Will Perform Lower Body Dressing: with modified independence;sit to/from stand Pt Will Transfer to Toilet: with modified independence;ambulating Pt Will Perform Toileting - Clothing Manipulation and hygiene: with modified independence;sit to/from stand Pt Will Perform Tub/Shower Transfer: Tub transfer;with modified independence;ambulating  OT Frequency: Min 2X/week   Barriers to D/C: Decreased caregiver support  pt is caregiver to spouse       Co-evaluation              AM-PAC PT "6 Clicks" Daily Activity     Outcome Measure Help from another person eating meals?: None Help from another person taking care of personal grooming?: None Help from another person toileting, which includes using toliet, bedpan, or urinal?: A Little Help from another person bathing (including washing, rinsing, drying)?: A Little Help from another person to put on and taking off regular upper body clothing?: None Help from another person to put on and taking off regular lower body clothing?: A Little 6 Click Score: 21   End of Session Equipment Utilized During Treatment: Gait belt;Rolling walker Nurse Communication: Mobility status;Other (comment) (lightheaded)  Activity Tolerance: Other (comment) (lightheaded, worse once walking)  Patient left: in bed;with call bell/phone within reach;with bed alarm set  OT Visit Diagnosis: Unsteadiness on feet (R26.81);Muscle weakness (generalized) (M62.81);Pain                Time: 1449-1511 OT Time Calculation (min): 22 min Charges:  OT General Charges $OT Visit: 1 Procedure OT Evaluation $OT Eval Low Complexity: 1 Procedure G-Codes:       Hortencia Pilar 02/22/2017, 3:33 PM

## 2017-02-23 NOTE — Progress Notes (Signed)
Discharge instructions, medications/prescriptions and follow up appointments discussed and reviewed with pt and daughter at bedside. IV dc'ed site clean and dry. Pt transportation for dialysis was arranged by the SW. Pt was escorted out of the unit in wheelchair, took all belongings with her.

## 2017-02-23 NOTE — Progress Notes (Signed)
   Subjective:  Patient finished HD too late last evening to be discharged home.  She is without complaints this morning and will be discharged to go to her outpatient HD center on Monday.  Objective:  Vital signs in last 24 hours: Vitals:   02/22/17 2030 02/22/17 2048 02/22/17 2224 02/23/17 0537  BP: (!) 185/86  (!) 155/70 (!) 154/56  Pulse: 76  72 74  Resp: 16  19 20   Temp: (!) 97.5 F (36.4 C)  98.7 F (37.1 C) 98.6 F (37 C)  TempSrc: Oral  Oral Oral  SpO2: 99%  90% 98%  Weight:  119 lb 0.8 oz (54 kg) 119 lb 7.8 oz (54.2 kg)   Height:       General: resting in bed HEENT: NCAT, EOMI, no scleral icterus Cardiac: RRR, 2/6 systolic murmur Pulm: normal effort Neuro: alert and oriented, no focal deficits.  Assessment/Plan:  ESRD, Anemia of Chronic Disease S/p 3 sessions of HD. Returned from HD too late last night to be discharged home.  No other acute events noted.  She will DC today and is set up for HD and [facility] starting tomorrow for MWF  Dispo: Anticipated discharge today.   Jule Ser, DO 02/23/2017, 8:50 AM

## 2017-02-23 NOTE — Clinical Social Work Note (Signed)
CSW consulted for HD transportation, pt discharging home today if HD transport is set, HD chair time is  MWF 10AM @ [facility] [employer]. Pt has Medicaid, resources given to dtr @ bedside to get transportation set up through [employer] [phone]. CSW also advised dtr to contact SW @ [facility] to get emergent transport set up since family cannot provide transportation. Dtr reporting that she works in [place] and has no way to get pt to/from HD. CSW offered bus passes however pt is legally blind, walks with a cane and unable to manage a bus trip. CSW advised dtr the family would have to have some responsibility with transportation for pt. Pt reports that she cannot afford a taxi, dtr reports that she cannot either. Dtr agreeable to get off work at 3 in [place] this week to pick pt up @ HD.  CSW contacted Asst Dir. (ZB) to discuss the option of providing pt a taxi voucher for pt to HD ONLY for MWF. All options explored per recommendations from Asst. Dir. Pt/Dtr report that they are not connected to a church congregation to obtain assistance and NO other family with cars to assist.  Taxi Voucher option approved. Pt/Dtr understands this will be a ONE TIME accomodation for pt to get to HD and going forward Medicaid transport will have to used or pt/family will have to find resources to get pt to HD.  Taxi Vouchers to HD 8/20,8/22, 8/24 given to [facility] with instructions on how to call [facility].  Pt has no other identified DC needs at this time.  Nancy [name] Clinical Social Work Dept Weekend Social Worker [phone] 3:53 PM

## 2017-02-23 NOTE — Progress Notes (Signed)
Patient ID: Nancy [name], female   DOB: [birth year], 76 y.o.   MRN: [id number] S: Feels better O:BP (!) 154/56 (BP Location: Right Arm)   Pulse 74   Temp 98.6 F (37 C) (Oral)   Resp 20   Ht 5\' 3"  (1.6 m)   Wt 54.2 kg (119 lb 7.8 oz)   SpO2 98%   BMI 21.17 kg/m   Intake/Output Summary (Last 24 hours) at 02/23/17 1131 Last data filed at 02/23/17 0852  Gross per 24 hour  Intake              600 ml  Output             2000 ml  Net            -1400 ml   Intake/Output: I/O last 3 completed shifts: In: 47 [P.O.:720] Out: 2100 [Urine:100; Other:2000]  Intake/Output this shift:  Total I/O In: 120 [P.O.:120] Out: 0  Weight change: -1.5 kg (-3 lb 4.9 oz) Gen:NAD CVS:no rub Resp:cta KKX:FGHWEX Ext:no edema, LUE AVF +T/B   Recent Labs Lab 02/17/17 1832 02/17/17 1901 02/19/17 0703 02/20/17 0212 02/21/17 0344 02/22/17 0312  NA 142 143 144 141 138 138  K 4.9 4.8 5.2* 3.4* 3.8 4.3  CL 115* 118* 117* 109 103 104  CO2 11*  --  9* 20* 25 20*  GLUCOSE 118* 115* 84 92 93 105*  BUN 143* 133* 137* 63* 27* 39*  CREATININE 12.13* 13.20* 11.76* 7.03* 4.29* 5.56*  ALBUMIN 3.3*  --  3.1*  --   --   --   CALCIUM 8.3*  --  7.6* 7.0* 6.8* 6.3*  PHOS  --   --  8.3*  --   --   --   AST 7*  --   --   --   --   --   ALT 6*  --   --   --   --   --    Liver Function Tests:  Recent Labs Lab 02/17/17 1832 02/19/17 0703  AST 7*  --   ALT 6*  --   ALKPHOS 57  --   BILITOT 0.7  --   PROT 6.8  --   ALBUMIN 3.3* 3.1*   No results for input(s): LIPASE, AMYLASE in the last 168 hours. No results for input(s): AMMONIA in the last 168 hours. CBC:  Recent Labs Lab 02/17/17 1832  02/18/17 1456 02/19/17 0703 02/20/17 0212  02/21/17 0344 02/21/17 1812 02/22/17 0312  WBC 6.3  --  9.9 8.9 8.2  --  6.8  --  7.7  NEUTROABS 5.2  --   --   --   --   --   --   --   --   HGB 5.1*  < > 7.9* 8.0* 6.7*  < > 6.8* 8.5* 8.5*  HCT 15.9*  < > 23.9* 24.8* 20.4*  < > 20.8* 25.4* 25.8*  MCV 85.9  --   86.0 87.6 85.4  --  86.0  --  86.9  PLT 152  --  153 138* 106*  --  103*  --  117*  < > = values in this interval not displayed. Cardiac Enzymes: No results for input(s): CKTOTAL, CKMB, CKMBINDEX, TROPONINI in the last 168 hours. CBG:  Recent Labs Lab 02/17/17 1834 02/20/17 2151  GLUCAP 107* 84    Iron Studies: No results for input(s): IRON, TIBC, TRANSFERRIN, FERRITIN in the last 72 hours. Studies/Results: No results found. Marland Kitchen aspirin EC  81 mg Oral Daily  . calcitRIOL  0.25 mcg Oral Daily  . calcium acetate  1,334 mg Oral TID WC  . cinacalcet  60 mg Oral Q breakfast  . cloNIDine  0.2 mg Transdermal Weekly  . famotidine  20 mg Oral Daily  . heparin  5,000 Units Subcutaneous Q8H  . sodium bicarbonate  650 mg Oral BID    BMET    Component Value Date/Time   NA 138 02/22/2017 0312   NA 143 01/15/2017 1055   K 4.3 02/22/2017 0312   CL 104 02/22/2017 0312   CO2 20 (L) 02/22/2017 0312   GLUCOSE 105 (H) 02/22/2017 0312   BUN 39 (H) 02/22/2017 0312   BUN 109 (HH) 01/15/2017 1055   CREATININE 5.56 (H) 02/22/2017 0312   CREATININE 5.79 (H) 11/25/2014 1011   CALCIUM 6.3 (LL) 02/22/2017 0312   GFRNONAA 7 (L) 02/22/2017 0312   GFRNONAA 9 (L) 02/09/2014 0909   GFRAA 8 (L) 02/22/2017 0312   GFRAA 10 (L) 02/09/2014 0909   CBC    Component Value Date/Time   WBC 7.7 02/22/2017 0312   RBC 2.97 (L) 02/22/2017 0312   HGB 8.5 (L) 02/22/2017 0312   HGB 8.3 (L) 01/15/2017 1055   HCT 25.8 (L) 02/22/2017 0312   HCT 26.5 (L) 01/15/2017 1055   PLT 117 (L) 02/22/2017 0312   PLT 160 01/15/2017 1055   MCV 86.9 02/22/2017 0312   MCV 86 01/15/2017 1055   MCH 28.6 02/22/2017 0312   MCHC 32.9 02/22/2017 0312   RDW 15.6 (H) 02/22/2017 0312   RDW 17.6 (H) 01/15/2017 1055   LYMPHSABS 0.7 02/17/2017 1832   MONOABS 0.3 02/17/2017 1832   EOSABS 0.1 02/17/2017 1832   BASOSABS 0.0 02/17/2017 1832     Assessment/Plan: 1. End stage renal disease with uremia- pt with asterixis, anorexia,  profound anemia, and intermittent pericardial friction rub. I had a frank conversation with Nancy [name] regarding dialysis vs dying at home with hospice. She is now amenable to proceed with HD and understands that she has end stage renal disease.  1. Will need transportation from home to dialysis for tomorrow, CM involved 2. Outpatient dialysis spot set up at [facility] qMWF. 2. Vascular access- appreciate VVS assistance.  1. s/ptunneled RIJ HD catheter and LUE AVF 02/19/17 3. Anemia of chronic kidney disease- s/p blood transufion and will start ESA with HD. 1. Agree with blood transfusion and follow 4. HTN- stable 5. Metabolic acidosis- plan for HD and can d/c sodium bicarb. 6. Right knee sprain 7. Secondary HPTH/Bone mineral metabolism- will check ca/phos/iPTH and restart sensipar, calcitriol, and change renvela to phoslo due to hypocalcemia. 8. Nutrition- will need renal diet and dietician education 9. Remote left cerebellar infarct- seen on CT 10. Disposition- stable for discharge to home today f/u at [facility] on Monday  Donetta Potts, MD [facility] [phone]

## 2017-02-23 NOTE — Progress Notes (Signed)
Patient returned from hemodialysis at approximately 2245.   When granddaughter called to pick up patient, she did not feel comfortable getting patient this late at night.  The patient also expressed concerns about leaving late in the night.  MD called and made aware.  Discharge held until in the AM.  Granddaughter and patient made aware.  Will continue to monitor patient.  Earleen Reaper RN-BC, [employer]

## 2017-02-24 ENCOUNTER — Other Ambulatory Visit: Payer: Self-pay | Admitting: *Deleted

## 2017-02-24 NOTE — Patient Outreach (Signed)
[place] [facility]) Care Management  02/24/2017  [place] B Gees[place] 1941-01-23 [id number]   CSW had received message from [facility], Orrin Brigham that patient had been admitted to the hospital 8/13 with End Stage Renal Disease and discharged home 8/18 and was placed for outpatient dialysis with a schedule of Monday, Wednesday, Friday. CSW called & confirmed with patient's husband, Marchia Bond that they had received the resources that [facility] CMA had mailed patient on 8/1 for personal care to assist with light housekeeping. Patient's husband reports that no further CSW needs identified. CSW will close case.   CSW will fax an update to patient's Primary Care Physician, Dr. Gilles Chiquito to ensure that they are aware of CSW's involvement with patient's plan of care. CSW will submit a case closure request to [place], Care Management Assistant with [place] in the form of an inbasket message.    Raynaldo Opitz, LCSW [facility]  Clinical Social Worker cell #: [phone]

## 2017-02-26 DIAGNOSIS — Z23 Encounter for immunization: Secondary | ICD-10-CM | POA: Diagnosis not present

## 2017-02-26 DIAGNOSIS — N186 End stage renal disease: Secondary | ICD-10-CM | POA: Diagnosis not present

## 2017-02-26 DIAGNOSIS — D631 Anemia in chronic kidney disease: Secondary | ICD-10-CM | POA: Diagnosis not present

## 2017-02-26 DIAGNOSIS — N2581 Secondary hyperparathyroidism of renal origin: Secondary | ICD-10-CM | POA: Diagnosis not present

## 2017-02-26 DIAGNOSIS — D689 Coagulation defect, unspecified: Secondary | ICD-10-CM | POA: Diagnosis not present

## 2017-02-26 DIAGNOSIS — R51 Headache: Secondary | ICD-10-CM | POA: Diagnosis not present

## 2017-02-27 ENCOUNTER — Telehealth: Payer: Self-pay

## 2017-02-27 ENCOUNTER — Other Ambulatory Visit: Payer: Self-pay | Admitting: *Deleted

## 2017-02-27 MED ORDER — NITROGLYCERIN 0.3 MG SL SUBL: 0.3000 mg | SUBLINGUAL_TABLET | SUBLINGUAL | 0 refills | Status: DC | PRN

## 2017-02-27 NOTE — Telephone Encounter (Signed)
Pt was called and states she has"no medicine at the pharmacy", I called [facility] the pharmacist and I went over her entire medlist, she needed furosemide from dr Mercy Moore and I have sent a ntg. Refill to pcp. Pharmacist will call pt and reassure her that she does have refills on all her meds except for these 2 and they have been submitted for consideration

## 2017-02-27 NOTE — Telephone Encounter (Signed)
Needs to speak with a nurse about something.

## 2017-02-28 DIAGNOSIS — Z23 Encounter for immunization: Secondary | ICD-10-CM | POA: Diagnosis not present

## 2017-02-28 DIAGNOSIS — N186 End stage renal disease: Secondary | ICD-10-CM | POA: Diagnosis not present

## 2017-02-28 DIAGNOSIS — R51 Headache: Secondary | ICD-10-CM | POA: Diagnosis not present

## 2017-02-28 DIAGNOSIS — N2581 Secondary hyperparathyroidism of renal origin: Secondary | ICD-10-CM | POA: Diagnosis not present

## 2017-02-28 DIAGNOSIS — D689 Coagulation defect, unspecified: Secondary | ICD-10-CM | POA: Diagnosis not present

## 2017-02-28 DIAGNOSIS — D631 Anemia in chronic kidney disease: Secondary | ICD-10-CM | POA: Diagnosis not present

## 2017-03-03 DIAGNOSIS — N2581 Secondary hyperparathyroidism of renal origin: Secondary | ICD-10-CM | POA: Diagnosis not present

## 2017-03-03 DIAGNOSIS — N186 End stage renal disease: Secondary | ICD-10-CM | POA: Diagnosis not present

## 2017-03-03 DIAGNOSIS — D631 Anemia in chronic kidney disease: Secondary | ICD-10-CM | POA: Diagnosis not present

## 2017-03-03 DIAGNOSIS — Z23 Encounter for immunization: Secondary | ICD-10-CM | POA: Diagnosis not present

## 2017-03-03 DIAGNOSIS — R51 Headache: Secondary | ICD-10-CM | POA: Diagnosis not present

## 2017-03-03 DIAGNOSIS — D689 Coagulation defect, unspecified: Secondary | ICD-10-CM | POA: Diagnosis not present

## 2017-03-05 DIAGNOSIS — N2581 Secondary hyperparathyroidism of renal origin: Secondary | ICD-10-CM | POA: Diagnosis not present

## 2017-03-05 DIAGNOSIS — Z23 Encounter for immunization: Secondary | ICD-10-CM | POA: Diagnosis not present

## 2017-03-05 DIAGNOSIS — D631 Anemia in chronic kidney disease: Secondary | ICD-10-CM | POA: Diagnosis not present

## 2017-03-05 DIAGNOSIS — R51 Headache: Secondary | ICD-10-CM | POA: Diagnosis not present

## 2017-03-05 DIAGNOSIS — D689 Coagulation defect, unspecified: Secondary | ICD-10-CM | POA: Diagnosis not present

## 2017-03-05 DIAGNOSIS — N186 End stage renal disease: Secondary | ICD-10-CM | POA: Diagnosis not present

## 2017-03-07 DIAGNOSIS — N186 End stage renal disease: Secondary | ICD-10-CM | POA: Diagnosis not present

## 2017-03-07 DIAGNOSIS — D689 Coagulation defect, unspecified: Secondary | ICD-10-CM | POA: Diagnosis not present

## 2017-03-07 DIAGNOSIS — N2581 Secondary hyperparathyroidism of renal origin: Secondary | ICD-10-CM | POA: Diagnosis not present

## 2017-03-07 DIAGNOSIS — Z992 Dependence on renal dialysis: Secondary | ICD-10-CM | POA: Diagnosis not present

## 2017-03-07 DIAGNOSIS — I150 Renovascular hypertension: Secondary | ICD-10-CM | POA: Diagnosis not present

## 2017-03-07 DIAGNOSIS — D631 Anemia in chronic kidney disease: Secondary | ICD-10-CM | POA: Diagnosis not present

## 2017-03-07 DIAGNOSIS — Z23 Encounter for immunization: Secondary | ICD-10-CM | POA: Diagnosis not present

## 2017-03-07 DIAGNOSIS — R51 Headache: Secondary | ICD-10-CM | POA: Diagnosis not present

## 2017-03-10 DIAGNOSIS — Z23 Encounter for immunization: Secondary | ICD-10-CM | POA: Diagnosis not present

## 2017-03-10 DIAGNOSIS — D689 Coagulation defect, unspecified: Secondary | ICD-10-CM | POA: Diagnosis not present

## 2017-03-10 DIAGNOSIS — N2581 Secondary hyperparathyroidism of renal origin: Secondary | ICD-10-CM | POA: Diagnosis not present

## 2017-03-10 DIAGNOSIS — N186 End stage renal disease: Secondary | ICD-10-CM | POA: Diagnosis not present

## 2017-03-10 DIAGNOSIS — R51 Headache: Secondary | ICD-10-CM | POA: Diagnosis not present

## 2017-03-10 DIAGNOSIS — D631 Anemia in chronic kidney disease: Secondary | ICD-10-CM | POA: Diagnosis not present

## 2017-03-10 DIAGNOSIS — D509 Iron deficiency anemia, unspecified: Secondary | ICD-10-CM | POA: Diagnosis not present

## 2017-03-12 DIAGNOSIS — D631 Anemia in chronic kidney disease: Secondary | ICD-10-CM | POA: Diagnosis not present

## 2017-03-12 DIAGNOSIS — N186 End stage renal disease: Secondary | ICD-10-CM | POA: Diagnosis not present

## 2017-03-12 DIAGNOSIS — D689 Coagulation defect, unspecified: Secondary | ICD-10-CM | POA: Diagnosis not present

## 2017-03-12 DIAGNOSIS — R51 Headache: Secondary | ICD-10-CM | POA: Diagnosis not present

## 2017-03-12 DIAGNOSIS — D509 Iron deficiency anemia, unspecified: Secondary | ICD-10-CM | POA: Diagnosis not present

## 2017-03-12 DIAGNOSIS — N2581 Secondary hyperparathyroidism of renal origin: Secondary | ICD-10-CM | POA: Diagnosis not present

## 2017-03-12 DIAGNOSIS — Z23 Encounter for immunization: Secondary | ICD-10-CM | POA: Diagnosis not present

## 2017-03-14 DIAGNOSIS — D509 Iron deficiency anemia, unspecified: Secondary | ICD-10-CM | POA: Diagnosis not present

## 2017-03-14 DIAGNOSIS — D689 Coagulation defect, unspecified: Secondary | ICD-10-CM | POA: Diagnosis not present

## 2017-03-14 DIAGNOSIS — D631 Anemia in chronic kidney disease: Secondary | ICD-10-CM | POA: Diagnosis not present

## 2017-03-14 DIAGNOSIS — N2581 Secondary hyperparathyroidism of renal origin: Secondary | ICD-10-CM | POA: Diagnosis not present

## 2017-03-14 DIAGNOSIS — N186 End stage renal disease: Secondary | ICD-10-CM | POA: Diagnosis not present

## 2017-03-14 DIAGNOSIS — R51 Headache: Secondary | ICD-10-CM | POA: Diagnosis not present

## 2017-03-14 DIAGNOSIS — Z23 Encounter for immunization: Secondary | ICD-10-CM | POA: Diagnosis not present

## 2017-03-17 DIAGNOSIS — N2581 Secondary hyperparathyroidism of renal origin: Secondary | ICD-10-CM | POA: Diagnosis not present

## 2017-03-17 DIAGNOSIS — N186 End stage renal disease: Secondary | ICD-10-CM | POA: Diagnosis not present

## 2017-03-17 DIAGNOSIS — Z23 Encounter for immunization: Secondary | ICD-10-CM | POA: Diagnosis not present

## 2017-03-17 DIAGNOSIS — D509 Iron deficiency anemia, unspecified: Secondary | ICD-10-CM | POA: Diagnosis not present

## 2017-03-17 DIAGNOSIS — R51 Headache: Secondary | ICD-10-CM | POA: Diagnosis not present

## 2017-03-17 DIAGNOSIS — D631 Anemia in chronic kidney disease: Secondary | ICD-10-CM | POA: Diagnosis not present

## 2017-03-17 DIAGNOSIS — D689 Coagulation defect, unspecified: Secondary | ICD-10-CM | POA: Diagnosis not present

## 2017-03-19 DIAGNOSIS — D509 Iron deficiency anemia, unspecified: Secondary | ICD-10-CM | POA: Diagnosis not present

## 2017-03-19 DIAGNOSIS — N2581 Secondary hyperparathyroidism of renal origin: Secondary | ICD-10-CM | POA: Diagnosis not present

## 2017-03-19 DIAGNOSIS — N186 End stage renal disease: Secondary | ICD-10-CM | POA: Diagnosis not present

## 2017-03-19 DIAGNOSIS — D689 Coagulation defect, unspecified: Secondary | ICD-10-CM | POA: Diagnosis not present

## 2017-03-19 DIAGNOSIS — R51 Headache: Secondary | ICD-10-CM | POA: Diagnosis not present

## 2017-03-19 DIAGNOSIS — Z23 Encounter for immunization: Secondary | ICD-10-CM | POA: Diagnosis not present

## 2017-03-19 DIAGNOSIS — D631 Anemia in chronic kidney disease: Secondary | ICD-10-CM | POA: Diagnosis not present

## 2017-03-20 DIAGNOSIS — R531 Weakness: Secondary | ICD-10-CM | POA: Diagnosis not present

## 2017-03-21 DIAGNOSIS — D509 Iron deficiency anemia, unspecified: Secondary | ICD-10-CM | POA: Diagnosis not present

## 2017-03-21 DIAGNOSIS — N2581 Secondary hyperparathyroidism of renal origin: Secondary | ICD-10-CM | POA: Diagnosis not present

## 2017-03-21 DIAGNOSIS — N186 End stage renal disease: Secondary | ICD-10-CM | POA: Diagnosis not present

## 2017-03-21 DIAGNOSIS — D689 Coagulation defect, unspecified: Secondary | ICD-10-CM | POA: Diagnosis not present

## 2017-03-21 DIAGNOSIS — D631 Anemia in chronic kidney disease: Secondary | ICD-10-CM | POA: Diagnosis not present

## 2017-03-21 DIAGNOSIS — Z23 Encounter for immunization: Secondary | ICD-10-CM | POA: Diagnosis not present

## 2017-03-21 DIAGNOSIS — R51 Headache: Secondary | ICD-10-CM | POA: Diagnosis not present

## 2017-03-24 DIAGNOSIS — D631 Anemia in chronic kidney disease: Secondary | ICD-10-CM | POA: Diagnosis not present

## 2017-03-24 DIAGNOSIS — D689 Coagulation defect, unspecified: Secondary | ICD-10-CM | POA: Diagnosis not present

## 2017-03-24 DIAGNOSIS — D509 Iron deficiency anemia, unspecified: Secondary | ICD-10-CM | POA: Diagnosis not present

## 2017-03-24 DIAGNOSIS — Z23 Encounter for immunization: Secondary | ICD-10-CM | POA: Diagnosis not present

## 2017-03-24 DIAGNOSIS — N2581 Secondary hyperparathyroidism of renal origin: Secondary | ICD-10-CM | POA: Diagnosis not present

## 2017-03-24 DIAGNOSIS — R51 Headache: Secondary | ICD-10-CM | POA: Diagnosis not present

## 2017-03-24 DIAGNOSIS — N186 End stage renal disease: Secondary | ICD-10-CM | POA: Diagnosis not present

## 2017-03-26 DIAGNOSIS — N186 End stage renal disease: Secondary | ICD-10-CM | POA: Diagnosis not present

## 2017-03-26 DIAGNOSIS — D689 Coagulation defect, unspecified: Secondary | ICD-10-CM | POA: Diagnosis not present

## 2017-03-26 DIAGNOSIS — D631 Anemia in chronic kidney disease: Secondary | ICD-10-CM | POA: Diagnosis not present

## 2017-03-26 DIAGNOSIS — R51 Headache: Secondary | ICD-10-CM | POA: Diagnosis not present

## 2017-03-26 DIAGNOSIS — D509 Iron deficiency anemia, unspecified: Secondary | ICD-10-CM | POA: Diagnosis not present

## 2017-03-26 DIAGNOSIS — Z23 Encounter for immunization: Secondary | ICD-10-CM | POA: Diagnosis not present

## 2017-03-26 DIAGNOSIS — N2581 Secondary hyperparathyroidism of renal origin: Secondary | ICD-10-CM | POA: Diagnosis not present

## 2017-03-27 ENCOUNTER — Telehealth: Payer: Self-pay | Admitting: *Deleted

## 2017-03-27 NOTE — Telephone Encounter (Signed)
Information for PA for Sens[employer]ar was sent to CoverMyMeds  Awaiting determination from [place].  Sander Nephew, RN 9/20/018 10:01 AM.

## 2017-03-28 DIAGNOSIS — N2581 Secondary hyperparathyroidism of renal origin: Secondary | ICD-10-CM | POA: Diagnosis not present

## 2017-03-28 DIAGNOSIS — D509 Iron deficiency anemia, unspecified: Secondary | ICD-10-CM | POA: Diagnosis not present

## 2017-03-28 DIAGNOSIS — Z23 Encounter for immunization: Secondary | ICD-10-CM | POA: Diagnosis not present

## 2017-03-28 DIAGNOSIS — N186 End stage renal disease: Secondary | ICD-10-CM | POA: Diagnosis not present

## 2017-03-28 DIAGNOSIS — D689 Coagulation defect, unspecified: Secondary | ICD-10-CM | POA: Diagnosis not present

## 2017-03-28 DIAGNOSIS — R51 Headache: Secondary | ICD-10-CM | POA: Diagnosis not present

## 2017-03-28 DIAGNOSIS — D631 Anemia in chronic kidney disease: Secondary | ICD-10-CM | POA: Diagnosis not present

## 2017-03-30 ENCOUNTER — Emergency Department (HOSPITAL_COMMUNITY): Payer: Medicare Other

## 2017-03-30 ENCOUNTER — Emergency Department (HOSPITAL_COMMUNITY)
Admission: EM | Admit: 2017-03-30 | Discharge: 2017-03-30 | Disposition: A | Discharge status: Home / Self Care | Payer: Medicare Other | Attending: Emergency Medicine | Admitting: Emergency Medicine

## 2017-03-30 ENCOUNTER — Encounter (HOSPITAL_COMMUNITY): Payer: Self-pay

## 2017-03-30 DIAGNOSIS — Z992 Dependence on renal dialysis: Secondary | ICD-10-CM | POA: Diagnosis not present

## 2017-03-30 DIAGNOSIS — S0003XA Contusion of scalp, initial encounter: Secondary | ICD-10-CM

## 2017-03-30 DIAGNOSIS — M25552 Pain in left hip: Secondary | ICD-10-CM | POA: Diagnosis not present

## 2017-03-30 DIAGNOSIS — Z7982 Long term (current) use of aspirin: Secondary | ICD-10-CM | POA: Insufficient documentation

## 2017-03-30 DIAGNOSIS — I120 Hypertensive chronic kidney disease with stage 5 chronic kidney disease or end stage renal disease: Secondary | ICD-10-CM | POA: Diagnosis not present

## 2017-03-30 DIAGNOSIS — Y998 Other external cause status: Secondary | ICD-10-CM | POA: Diagnosis not present

## 2017-03-30 DIAGNOSIS — T148XXA Other injury of unspecified body region, initial encounter: Secondary | ICD-10-CM | POA: Diagnosis not present

## 2017-03-30 DIAGNOSIS — S7002XA Contusion of left hip, initial encounter: Secondary | ICD-10-CM | POA: Diagnosis not present

## 2017-03-30 DIAGNOSIS — S79912A Unspecified injury of left hip, initial encounter: Secondary | ICD-10-CM | POA: Diagnosis not present

## 2017-03-30 DIAGNOSIS — S0990XA Unspecified injury of head, initial encounter: Secondary | ICD-10-CM | POA: Diagnosis not present

## 2017-03-30 DIAGNOSIS — N186 End stage renal disease: Secondary | ICD-10-CM | POA: Insufficient documentation

## 2017-03-30 DIAGNOSIS — Y9389 Activity, other specified: Secondary | ICD-10-CM | POA: Insufficient documentation

## 2017-03-30 DIAGNOSIS — W0110XA Fall on same level from slipping, tripping and stumbling with subsequent striking against unspecified object, initial encounter: Secondary | ICD-10-CM | POA: Insufficient documentation

## 2017-03-30 DIAGNOSIS — Y929 Unspecified place or not applicable: Secondary | ICD-10-CM | POA: Diagnosis not present

## 2017-03-30 DIAGNOSIS — M545 Low back pain: Secondary | ICD-10-CM | POA: Diagnosis not present

## 2017-03-30 DIAGNOSIS — Z79899 Other long term (current) drug therapy: Secondary | ICD-10-CM | POA: Insufficient documentation

## 2017-03-30 MED ORDER — TRAMADOL HCL 50 MG PO TABS
50.0000 mg | ORAL_TABLET | Freq: Once | ORAL | Status: AC
  Administered 2017-03-30: 50 mg via ORAL
  Filled 2017-03-30: qty 1

## 2017-03-30 MED ORDER — TRAMADOL HCL 50 MG PO TABS: 50.0000 mg | ORAL_TABLET | Freq: Three times a day (TID) | ORAL | 0 refills | Status: DC | PRN

## 2017-03-30 NOTE — ED Triage Notes (Signed)
Pt from home by [facility] for fall. Pt tripped over her shoes and hit the back of her head and has a knott. Pt ha A&O x4

## 2017-03-30 NOTE — ED Notes (Signed)
Pt ambulated well in the hall with walker

## 2017-03-30 NOTE — ED Provider Notes (Signed)
[place] DEPT Provider Note   CSN: [id number] Arrival date & time: 03/30/17  4403     History   Chief Complaint Chief Complaint  Patient presents with  . Fall    HPI Nancy [name] is a 76 y.o. female.  HPI Patient states she was in her normal health and turned around and slipped while getting ready to go to church. She fell back onto her low back and left hip and hit the back of her head. There was no loss of consciousness. She was unable to get up off the ground. She complains of headache. No neck pain, visual changes. She also has low back and left hip pain. No numbness or weakness. Patient was able to transfer when EMS arrived.  Past Medical History:  Diagnosis Date  . Aortic stenosis   . Bacterial sinusitis 09/17/2011  . CKD (chronic kidney disease) stage 4, GFR 15-29 ml/min ([place]) 08/11/2006   Cr continues to increase. Proteinuria on UA 02/10/12.    . Colitis   . CVA (cerebrovascular accident) [facility])    New hemorrhagic per CT scan '09  . Diverticulosis of colon   . Dysfunctional uterine bleeding   . Fecal impaction ([place])   . Headache(784.0)   . HERNIORRHAPHY, HX OF 08/11/2006  . Hypertension   . OA (osteoarthritis)    bilateral knees  . Postmenopausal   . Pulmonary nodule   . TINEA CRURIS 01/12/2007    Patient Active Problem List   Diagnosis Date Noted  . Symptomatic anemia 02/17/2017  . Secondary hyperparathyroidism of renal origin ([place]) 01/15/2017  . Tachycardia   . Mitral valve annular calcification   . Malnutrition of moderate degree 12/09/2016  . Dizziness 12/08/2016  . Constipation 10/15/2016  . Angina at rest [facility])   . Goals of care, counseling/discussion   . Palliative care encounter   . Metabolic acidosis 47/42/5956  . Aortic stenosis 05/21/2016  . Anemia associated with chronic renal failure 02/01/2016  . Atherosclerosis of aorta ([place]) 01/11/2015  . End stage renal disease ([place]) 02/04/2013  . Non-intractable vomiting with nausea 08/17/2012  .  Glaucoma 03/18/2012  . Health care maintenance 09/17/2011  . Osteoarthrosis involving lower leg 10/31/2008  . History of CVA (cerebrovascular accident) 01/28/2008  . Hyperlipidemia 02/13/2007  . PULMONARY NODULES 01/12/2007  . Left ventricular hypertrophy 09/02/2006  . Essential hypertension 08/11/2006  . GERD 08/11/2006  . Diverticulitis of colon 08/11/2006    Past Surgical History:  Procedure Laterality Date  . ABDOMINAL HYSTERECTOMY    . AV FISTULA PLACEMENT Left 02/19/2017   Procedure: CREATION OF LEFT ARM BRACHIOCEPHALIC ARTERIOVENOUS (AV) FISTULA;  Surgeon: Rosetta Posner, MD;  Location: [place];  Service: Vascular;  Laterality: Left;  . CHOLECYSTECTOMY  2009  . INGUINAL HERNIA REPAIR  2008  . INSERTION OF DIALYSIS CATHETER Right 02/19/2017   Procedure: INSERTION OF TUNNELED DIALYSIS CATHETER - RIGHT INTERNAL JUGULAR PLACEMENT;  Surgeon: Rosetta Posner, MD;  Location: [place];  Service: Vascular;  Laterality: Right;  . IRIDOTOMY / IRIDECTOMY     Laser, right eye 12/26/11 left eye 01/24/12  . MASS EXCISION Left 05/07/2013   Procedure: EXCISION CYST;  Surgeon: Myrtha Mantis., MD;  Location: [place];  Service: Ophthalmology;  Laterality: Left;    OB History    No data available       Home Medications    Prior to Admission medications   Medication Sig Start Date End Date Taking? Authorizing Provider  acetaminophen (TYLENOL) 500  MG tablet Take 1,000 mg by mouth every 8 (eight) hours as needed for mild pain, moderate pain, fever or headache.     [provider]  amLODipine (NORVASC) 10 MG tablet Take 10 mg by mouth daily. 01/23/17   [provider]  aspirin EC 81 MG EC tablet Take 1 tablet (81 mg total) by mouth daily. 12/10/16   Valinda Party, DO  calcitRIOL (ROCALTROL) 0.25 MCG capsule Take 1 capsule (0.25 mcg total) by mouth daily. 01/15/17   Sid Falcon, MD  calcitRIOL (ROCALTROL) 0.25 MCG capsule Take 1 capsule (0.25 mcg  total) by mouth daily. 02/23/17   Melanee Spry, MD  calcium acetate (PHOSLO) 667 MG capsule Take 2,001 mg by mouth 3 (three) times daily.  02/10/17   [provider]  calcium acetate (PHOSLO) 667 MG capsule Take 2 capsules (1,334 mg total) by mouth 3 (three) times daily with meals. 02/22/17   Melanee Spry, MD  Capsaicin-Menthol-Methyl Sal (CAPSAICIN-METHYL SAL-MENTHOL) 0.025-1-12 % CREA Apply a small amount to area of knee pain daily. 01/15/17   Sid Falcon, MD  carvedilol (COREG) 12.5 MG tablet Take 12.5 mg by mouth 2 (two) times daily. 02/17/17   [provider]  cloNIDine (CATAPRES - DOSED IN MG/24 HR) 0.2 mg/24hr patch Place 1 patch (0.2 mg total) onto the skin every 7 (seven) days. 01/15/17 01/15/18  Sid Falcon, MD  famotidine (PEPCID) 20 MG tablet Take 1 tablet (20 mg total) by mouth 2 (two) times daily. 01/15/17   Sid Falcon, MD  nitroGLYCERIN (NITROSTAT) 0.3 MG SL tablet Place 1 tablet (0.3 mg total) under the tongue every 5 (five) minutes as needed for chest pain. 02/27/17   Sid Falcon, MD  ondansetron (ZOFRAN-ODT) 4 MG disintegrating tablet Take 1 tablet (4 mg total) by mouth every 6 (six) hours as needed for nausea or vomiting. 01/15/17   Sid Falcon, MD  SENSIPAR 60 MG tablet Take 1 tablet (60 mg total) by mouth daily. 01/15/17   Sid Falcon, MD  sevelamer carbonate (RENVELA) 800 MG tablet Take 800 mg by mouth 3 (three) times daily. 02/10/17   [provider]  traMADol (ULTRAM) 50 MG tablet Take 1 tablet (50 mg total) by mouth every 8 (eight) hours as needed for severe pain. 03/30/17   Julianne Rice, MD    Family History Family History  Problem Relation Age of Onset  . Hypertension Mother   . Heart attack Mother   . Heart disease Mother     Social History Social History  Substance Use Topics  . Smoking status: Never Smoker  . Smokeless tobacco: Never Used  . Alcohol use No     Allergies    Hydrocodone-acetaminophen   Review of Systems Review of Systems  Constitutional: Negative for chills and fever.  Respiratory: Negative for cough and shortness of breath.   Cardiovascular: Negative for chest pain.  Gastrointestinal: Negative for abdominal pain, nausea and vomiting.  Genitourinary: Negative for dysuria, flank pain and frequency.  Musculoskeletal: Positive for arthralgias, back pain and gait problem. Negative for neck pain and neck stiffness.  Skin: Negative for rash and wound.  Neurological: Positive for headaches. Negative for dizziness, weakness, light-headedness and numbness.  All other systems reviewed and are negative.    Physical Exam Updated Vital Signs BP 131/64   Pulse (!) 102   Temp 98 F (36.7 C) (Oral)   Resp (!) 21   Ht 5\' 2"  (1.575 m)  Wt 56.7 kg (125 lb)   SpO2 99%   BMI 22.86 kg/m   Physical Exam  Constitutional: She is oriented to person, place, and time. She appears well-developed and well-nourished. No distress.  HENT:  Head: Normocephalic and atraumatic.  Mouth/Throat: Oropharynx is clear and moist.  Small posterior scalp hematoma. Midface is stable. No malocclusion.  Eyes: Pupils are equal, round, and reactive to light. EOM are normal.  Neck: Normal range of motion. Neck supple.  No posterior midline cervical tenderness to palpation.  Cardiovascular: Normal rate and regular rhythm.   Murmur heard. Pulmonary/Chest: Effort normal and breath sounds normal. No respiratory distress. She has no wheezes. She has no rales.  Abdominal: Soft. Bowel sounds are normal. There is no tenderness. There is no rebound and no guarding.  Musculoskeletal: Normal range of motion. She exhibits no edema or tenderness.  Patient has inferior lumbar and left-sided sacral tenderness. Chest tenderness over the lateral left hip. Pain with range of motion. Distal pulses are 2+.  Neurological: She is alert and oriented to person, place, and time.  5/5 strength in  bilateral upper and lower extremities. Patient does have some decreased range of motion of the left hip due to pain.  Skin: Skin is warm and dry. Capillary refill takes less than 2 seconds. No rash noted. No erythema.  Psychiatric: She has a normal mood and affect. Her behavior is normal.  Nursing note and vitals reviewed.    ED Treatments / Results  Labs (all labs ordered are listed, but only abnormal results are displayed) Labs Reviewed - No data to display  EKG  EKG Interpretation None       Radiology Dg Lumbar Spine 2-3 Views  Result Date: 03/30/2017 CLINICAL DATA:  Trip and fall with low back pain, initial encounter EXAM: LUMBAR SPINE - 2-3 VIEW COMPARISON:  06/21/2016 FINDINGS: Five lumbar type vertebral bodies are well visualized. Vertebral body height is well maintained. Mild osteophytic changes are seen. No anterolisthesis is noted. Facet hypertrophic changes are seen. IMPRESSION: Degenerative change without acute abnormality. Electronically Signed   By: Inez Catalina M.D.   On: 03/30/2017 10:29   Ct Head Wo Contrast  Result Date: 03/30/2017 CLINICAL DATA:  Injury to posterior head.  Initial encounter. EXAM: CT HEAD WITHOUT CONTRAST TECHNIQUE: Contiguous axial images were obtained from the base of the skull through the vertex without intravenous contrast. COMPARISON:  02/17/2017 FINDINGS: Brain: Stable atrophy, small vessel disease and old left cerebellar infarct. The brain demonstrates no evidence of hemorrhage, acute infarction, edema, mass effect, extra-axial fluid collection, hydrocephalus or mass lesion. Vascular: No hyperdense vessel or unexpected calcification. Skull: Normal. Negative for fracture or focal lesion. Sinuses/Orbits: No acute finding. Other: None. IMPRESSION: Stable head CT demonstrating no acute findings. Stable atrophy, small vessel disease and old left cerebellar infarct. Electronically Signed   By: Aletta Edouard M.D.   On: 03/30/2017 11:05   Dg Hip Unilat  With Pelvis 2-3 Views Left  Result Date: 03/30/2017 CLINICAL DATA:  Left hip pain following fall, initial encounter EXAM: DG HIP (WITH OR WITHOUT PELVIS) 2-3V LEFT COMPARISON:  None. FINDINGS: Pelvic ring is intact. No acute fracture or dislocation is noted. Calcified uterine fibroid is seen. Mild degenerative changes in the lumbar spine are noted. Mild degenerative changes are noted in the hip joints bilaterally. IMPRESSION: No acute abnormality noted. Electronically Signed   By: Inez Catalina M.D.   On: 03/30/2017 10:28    Procedures Procedures (including critical care time)  Medications Ordered  in ED Medications  traMADol (ULTRAM) tablet 50 mg (50 mg Oral Given 03/30/17 1136)     Initial Impression / Assessment and Plan / ED Course  I have reviewed the triage vital signs and the nursing notes.  Pertinent labs & imaging results that were available during my care of the patient were reviewed by me and considered in my medical decision making (see chart for details).     Patient is well-appearing. Stable vital signs. Imaging without evidence of acute injury. Given pain medication the emergency department and patient is able to ambulate with walker. Will discharge home with family. Follow up with primary physician and return precautions given.  Final Clinical Impressions(s) / ED Diagnoses   Final diagnoses:  Contusion of left hip, initial encounter  Scalp hematoma, initial encounter    New Prescriptions New Prescriptions   TRAMADOL (ULTRAM) 50 MG TABLET    Take 1 tablet (50 mg total) by mouth every 8 (eight) hours as needed for severe pain.     Julianne Rice, MD 03/30/17 [phone]

## 2017-03-31 DIAGNOSIS — Z23 Encounter for immunization: Secondary | ICD-10-CM | POA: Diagnosis not present

## 2017-03-31 DIAGNOSIS — D509 Iron deficiency anemia, unspecified: Secondary | ICD-10-CM | POA: Diagnosis not present

## 2017-03-31 DIAGNOSIS — D689 Coagulation defect, unspecified: Secondary | ICD-10-CM | POA: Diagnosis not present

## 2017-03-31 DIAGNOSIS — N2581 Secondary hyperparathyroidism of renal origin: Secondary | ICD-10-CM | POA: Diagnosis not present

## 2017-03-31 DIAGNOSIS — N186 End stage renal disease: Secondary | ICD-10-CM | POA: Diagnosis not present

## 2017-03-31 DIAGNOSIS — R51 Headache: Secondary | ICD-10-CM | POA: Diagnosis not present

## 2017-03-31 DIAGNOSIS — D631 Anemia in chronic kidney disease: Secondary | ICD-10-CM | POA: Diagnosis not present

## 2017-04-01 ENCOUNTER — Other Ambulatory Visit: Payer: Self-pay

## 2017-04-01 ENCOUNTER — Telehealth: Payer: Self-pay | Admitting: Internal Medicine

## 2017-04-01 DIAGNOSIS — Z48812 Encounter for surgical aftercare following surgery on the circulatory system: Secondary | ICD-10-CM

## 2017-04-01 NOTE — Telephone Encounter (Signed)
Pls call patient experiencing black outs

## 2017-04-02 DIAGNOSIS — N186 End stage renal disease: Secondary | ICD-10-CM | POA: Diagnosis not present

## 2017-04-02 DIAGNOSIS — D689 Coagulation defect, unspecified: Secondary | ICD-10-CM | POA: Diagnosis not present

## 2017-04-02 DIAGNOSIS — N2581 Secondary hyperparathyroidism of renal origin: Secondary | ICD-10-CM | POA: Diagnosis not present

## 2017-04-02 DIAGNOSIS — R51 Headache: Secondary | ICD-10-CM | POA: Diagnosis not present

## 2017-04-02 DIAGNOSIS — Z23 Encounter for immunization: Secondary | ICD-10-CM | POA: Diagnosis not present

## 2017-04-02 DIAGNOSIS — D631 Anemia in chronic kidney disease: Secondary | ICD-10-CM | POA: Diagnosis not present

## 2017-04-02 DIAGNOSIS — D509 Iron deficiency anemia, unspecified: Secondary | ICD-10-CM | POA: Diagnosis not present

## 2017-04-02 NOTE — Telephone Encounter (Signed)
If patient is asymptomatic and her BP is good at home and she does not want to be evaluated in the clinic or ED I am unsure of what else we would do. Please have her check her BP at home and ensure that it is good. If she notices it starting to go low again she will need to be evaluated. Patient's on HD do occasionally get low blood pressures but she appears to have tolerated HD well and only came off because she wanted to. Please have her call us if she develops any new symptoms of low BP like lightheadedness, near syncope or syncope

## 2017-04-02 NOTE — Telephone Encounter (Signed)
See note of 9/26

## 2017-04-02 NOTE — Telephone Encounter (Signed)
Tried to call pt but no answer, she calls today after dialysis and states her BP was too low there today and they had to take her off early, she states they did nothing but send her home, ask her to come to clinic for an appt or call 911 and go to ED, she refuses, stating she cannot leave her husband alone, told her I would check w/ md and call her back I called dialysis ctr and was told she came off 1 hr early because she wanted to, they do report her BP dropped to a low of 75/32 but by the time she came off it was 143/42 and she ambulated well.  Since I cannot convince pt to come to ED or clinic please advise

## 2017-04-03 ENCOUNTER — Ambulatory Visit: Payer: Medicare Other

## 2017-04-03 NOTE — Telephone Encounter (Signed)
rtc to pt, no answer, pt has appt 9/28

## 2017-04-04 ENCOUNTER — Encounter: Payer: Medicare Other | Admitting: Internal Medicine

## 2017-04-04 ENCOUNTER — Encounter: Payer: Self-pay | Admitting: Internal Medicine

## 2017-04-06 DIAGNOSIS — Z992 Dependence on renal dialysis: Secondary | ICD-10-CM | POA: Diagnosis not present

## 2017-04-06 DIAGNOSIS — I150 Renovascular hypertension: Secondary | ICD-10-CM | POA: Diagnosis not present

## 2017-04-06 DIAGNOSIS — N186 End stage renal disease: Secondary | ICD-10-CM | POA: Diagnosis not present

## 2017-04-07 DIAGNOSIS — D631 Anemia in chronic kidney disease: Secondary | ICD-10-CM | POA: Diagnosis not present

## 2017-04-07 DIAGNOSIS — N2581 Secondary hyperparathyroidism of renal origin: Secondary | ICD-10-CM | POA: Diagnosis not present

## 2017-04-07 DIAGNOSIS — D689 Coagulation defect, unspecified: Secondary | ICD-10-CM | POA: Diagnosis not present

## 2017-04-07 DIAGNOSIS — D509 Iron deficiency anemia, unspecified: Secondary | ICD-10-CM | POA: Diagnosis not present

## 2017-04-07 DIAGNOSIS — Z23 Encounter for immunization: Secondary | ICD-10-CM | POA: Diagnosis not present

## 2017-04-07 DIAGNOSIS — N186 End stage renal disease: Secondary | ICD-10-CM | POA: Diagnosis not present

## 2017-04-07 DIAGNOSIS — R51 Headache: Secondary | ICD-10-CM | POA: Diagnosis not present

## 2017-04-08 ENCOUNTER — Ambulatory Visit (INDEPENDENT_AMBULATORY_CARE_PROVIDER_SITE_OTHER): Payer: Self-pay | Admitting: Vascular Surgery

## 2017-04-08 ENCOUNTER — Encounter: Payer: Self-pay | Admitting: *Deleted

## 2017-04-08 ENCOUNTER — Ambulatory Visit (HOSPITAL_COMMUNITY)
Admit: 2017-04-08 | Discharge: 2017-04-08 | Disposition: A | Discharge status: Home / Self Care | Payer: Medicare Other | Attending: Vascular Surgery | Admitting: Vascular Surgery

## 2017-04-08 ENCOUNTER — Other Ambulatory Visit: Payer: Self-pay | Admitting: *Deleted

## 2017-04-08 ENCOUNTER — Encounter: Payer: Self-pay | Admitting: Vascular Surgery

## 2017-04-08 VITALS — BP 103/58 | HR 65 | Resp 16 | Ht 62.0 in | Wt 123.3 lb

## 2017-04-08 DIAGNOSIS — Z992 Dependence on renal dialysis: Secondary | ICD-10-CM

## 2017-04-08 DIAGNOSIS — Z48812 Encounter for surgical aftercare following surgery on the circulatory system: Secondary | ICD-10-CM | POA: Diagnosis not present

## 2017-04-08 DIAGNOSIS — N186 End stage renal disease: Secondary | ICD-10-CM

## 2017-04-08 NOTE — Progress Notes (Signed)
Patient name: Nancy [name] MRN: [id number] DOB: 26-May-1941 Sex: female  REASON FOR VISIT: Stress second stage basilic vein transposition fistula  HPI: Nancy [name] is a 76 y.o. female here today with her daughter for discussion of second stage basilic vein transposition fistula. She is well-known to me from placement of a right IJ hemodialysis catheter and first stage brachial basilic fistula creation on 02/19/2017. She has done well since her discharge from the hospital. They report no difficulty with her hemodialysis catheter. They do report she does is having difficulty with hypotension around the time of her hemodialysis. Denies any hand symptoms specifically related to steal. She's had complete resolution of soreness at her antecubital space from her incision  Current Outpatient Prescriptions  Medication Sig Dispense Refill  . acetaminophen (TYLENOL) 500 MG tablet Take 1,000 mg by mouth every 8 (eight) hours as needed for mild pain, moderate pain, fever or headache.     Marland Kitchen aspirin EC 81 MG EC tablet Take 1 tablet (81 mg total) by mouth daily. 30 tablet 1  . calcitRIOL (ROCALTROL) 0.25 MCG capsule Take 1 capsule (0.25 mcg total) by mouth daily. 90 capsule 0  . calcium acetate (PHOSLO) 667 MG capsule Take 2 capsules (1,334 mg total) by mouth 3 (three) times daily with meals. 90 capsule 1  . cloNIDine (CATAPRES - DOSED IN MG/24 HR) 0.2 mg/24hr patch Place 1 patch (0.2 mg total) onto the skin every 7 (seven) days. 12 patch 2  . traMADol (ULTRAM) 50 MG tablet Take 1 tablet (50 mg total) by mouth every 8 (eight) hours as needed for severe pain. 10 tablet 0  . amLODipine (NORVASC) 10 MG tablet Take 10 mg by mouth daily.  5  . carvedilol (COREG) 12.5 MG tablet Take 12.5 mg by mouth 2 (two) times daily.    . nitroGLYCERIN (NITROSTAT) 0.3 MG SL tablet Place 1 tablet (0.3 mg total) under the tongue every 5 (five) minutes as needed for chest pain. (Patient not  taking: Reported on 04/08/2017) 30 tablet 0  . SENSIPAR 60 MG tablet Take 1 tablet (60 mg total) by mouth daily. (Patient not taking: Reported on 04/08/2017) 30 tablet 6  . sevelamer carbonate (RENVELA) 800 MG tablet Take 800 mg by mouth 3 (three) times daily.  5   No current facility-administered medications for this visit.      PHYSICAL EXAM: Vitals:   04/08/17 1046  BP: (!) 103/58  Pulse: 65  Resp: 16  SpO2: 100%  Weight: 123 lb 4.8 oz (55.9 kg)  Height: 5\' 2"  (1.575 m)    GENERAL: The patient is a well-nourished female, in no acute distress. The vital signs are documented above. Excellent thrill in the left upper arm basilic vein fistula. Complete healing of the antecubital incision  No evidence of infection around her right IJ hemodialysis catheter  Duplex today shows size maturation is quite nicely and her basilic vein fistula. I imaged the fistula myself with SonoSite ultrasound and confirmed a very nice size maturation throughout its course.  MEDICAL ISSUES: Stable overall. We'll plan second stage basilic vein transposition fistula. Explained this as an outpatient and explained the procedure. I feel that she has an excellent chance of having a good use of her upper arm fistula due to the nice size maturation of her basilic vein. We will schedule this at her earliest convenience and expected recovery.   Rosetta Posner, MD FACS [facility] [phone] Pager [phone]  [phone] 

## 2017-04-09 DIAGNOSIS — N2581 Secondary hyperparathyroidism of renal origin: Secondary | ICD-10-CM | POA: Diagnosis not present

## 2017-04-09 DIAGNOSIS — D689 Coagulation defect, unspecified: Secondary | ICD-10-CM | POA: Diagnosis not present

## 2017-04-09 DIAGNOSIS — N186 End stage renal disease: Secondary | ICD-10-CM | POA: Diagnosis not present

## 2017-04-09 DIAGNOSIS — Z23 Encounter for immunization: Secondary | ICD-10-CM | POA: Diagnosis not present

## 2017-04-09 DIAGNOSIS — D509 Iron deficiency anemia, unspecified: Secondary | ICD-10-CM | POA: Diagnosis not present

## 2017-04-09 DIAGNOSIS — R51 Headache: Secondary | ICD-10-CM | POA: Diagnosis not present

## 2017-04-09 DIAGNOSIS — D631 Anemia in chronic kidney disease: Secondary | ICD-10-CM | POA: Diagnosis not present

## 2017-04-11 DIAGNOSIS — R51 Headache: Secondary | ICD-10-CM | POA: Diagnosis not present

## 2017-04-11 DIAGNOSIS — N2581 Secondary hyperparathyroidism of renal origin: Secondary | ICD-10-CM | POA: Diagnosis not present

## 2017-04-11 DIAGNOSIS — N186 End stage renal disease: Secondary | ICD-10-CM | POA: Diagnosis not present

## 2017-04-11 DIAGNOSIS — Z23 Encounter for immunization: Secondary | ICD-10-CM | POA: Diagnosis not present

## 2017-04-11 DIAGNOSIS — D689 Coagulation defect, unspecified: Secondary | ICD-10-CM | POA: Diagnosis not present

## 2017-04-11 DIAGNOSIS — D631 Anemia in chronic kidney disease: Secondary | ICD-10-CM | POA: Diagnosis not present

## 2017-04-11 DIAGNOSIS — D509 Iron deficiency anemia, unspecified: Secondary | ICD-10-CM | POA: Diagnosis not present

## 2017-04-11 NOTE — Progress Notes (Signed)
This encounter was created in error - please disregard.

## 2017-04-15 NOTE — Progress Notes (Signed)
Called pt to complete SDW-pre-op call and family member stated that the pt wants to cancel the procedure. Spoke with Zigmund Daniel, Surgical Coordinator to make aware. Kay to f/u with pt.

## 2017-04-16 DIAGNOSIS — N2581 Secondary hyperparathyroidism of renal origin: Secondary | ICD-10-CM | POA: Diagnosis not present

## 2017-04-16 DIAGNOSIS — D509 Iron deficiency anemia, unspecified: Secondary | ICD-10-CM | POA: Diagnosis not present

## 2017-04-16 DIAGNOSIS — Z23 Encounter for immunization: Secondary | ICD-10-CM | POA: Diagnosis not present

## 2017-04-16 DIAGNOSIS — D631 Anemia in chronic kidney disease: Secondary | ICD-10-CM | POA: Diagnosis not present

## 2017-04-16 DIAGNOSIS — R51 Headache: Secondary | ICD-10-CM | POA: Diagnosis not present

## 2017-04-16 DIAGNOSIS — N186 End stage renal disease: Secondary | ICD-10-CM | POA: Diagnosis not present

## 2017-04-16 DIAGNOSIS — D689 Coagulation defect, unspecified: Secondary | ICD-10-CM | POA: Diagnosis not present

## 2017-04-18 ENCOUNTER — Telehealth: Payer: Self-pay | Admitting: *Deleted

## 2017-04-18 NOTE — Telephone Encounter (Addendum)
Call to [facility]  for  PA for Calcitriol 0.9mcg capsules.  Information was given.  Determination to be made within 72 hours.  Sander Nephew, RN 04/18/2017 2:42 PM. Fax from [facility].  Medication was denied and copy  of denial was placed in Dr/ Mullen's and Dr. Hedwig Morton  boxes for completion. Sander Nephew, RN 02/19/2017. 4:12 PM.

## 2017-04-21 DIAGNOSIS — Z23 Encounter for immunization: Secondary | ICD-10-CM | POA: Diagnosis not present

## 2017-04-21 DIAGNOSIS — D631 Anemia in chronic kidney disease: Secondary | ICD-10-CM | POA: Diagnosis not present

## 2017-04-21 DIAGNOSIS — N186 End stage renal disease: Secondary | ICD-10-CM | POA: Diagnosis not present

## 2017-04-21 DIAGNOSIS — D689 Coagulation defect, unspecified: Secondary | ICD-10-CM | POA: Diagnosis not present

## 2017-04-21 DIAGNOSIS — R51 Headache: Secondary | ICD-10-CM | POA: Diagnosis not present

## 2017-04-21 DIAGNOSIS — N2581 Secondary hyperparathyroidism of renal origin: Secondary | ICD-10-CM | POA: Diagnosis not present

## 2017-04-21 DIAGNOSIS — D509 Iron deficiency anemia, unspecified: Secondary | ICD-10-CM | POA: Diagnosis not present

## 2017-04-22 ENCOUNTER — Encounter (HOSPITAL_COMMUNITY): Payer: Self-pay | Admitting: *Deleted

## 2017-04-22 NOTE — Progress Notes (Signed)
Spoke with pt for pre-op call. Pt denies cardiac history except for a heart murmur she states has never given her any problems. Pt states she is not a diabetic.  Pt states she can't take her medications on an empty stomach.

## 2017-04-23 ENCOUNTER — Encounter (HOSPITAL_COMMUNITY)
Admission: RE | Disposition: A | Discharge status: Home / Self Care | Payer: Self-pay | Source: Ambulatory Visit | Attending: Vascular Surgery

## 2017-04-23 ENCOUNTER — Encounter (HOSPITAL_COMMUNITY): Payer: Self-pay

## 2017-04-23 ENCOUNTER — Ambulatory Visit (HOSPITAL_COMMUNITY): Payer: Medicare Other | Admitting: Anesthesiology

## 2017-04-23 ENCOUNTER — Observation Stay (HOSPITAL_COMMUNITY)
Admission: RE | Admit: 2017-04-23 | Discharge: 2017-04-24 | Disposition: A | Discharge status: Home / Self Care | Payer: Medicare Other | Source: Ambulatory Visit | Attending: Vascular Surgery | Admitting: Vascular Surgery

## 2017-04-23 ENCOUNTER — Telehealth: Payer: Self-pay | Admitting: Vascular Surgery

## 2017-04-23 DIAGNOSIS — I509 Heart failure, unspecified: Secondary | ICD-10-CM | POA: Insufficient documentation

## 2017-04-23 DIAGNOSIS — D631 Anemia in chronic kidney disease: Secondary | ICD-10-CM | POA: Insufficient documentation

## 2017-04-23 DIAGNOSIS — I953 Hypotension of hemodialysis: Secondary | ICD-10-CM | POA: Insufficient documentation

## 2017-04-23 DIAGNOSIS — I132 Hypertensive heart and chronic kidney disease with heart failure and with stage 5 chronic kidney disease, or end stage renal disease: Secondary | ICD-10-CM | POA: Diagnosis not present

## 2017-04-23 DIAGNOSIS — K219 Gastro-esophageal reflux disease without esophagitis: Secondary | ICD-10-CM | POA: Insufficient documentation

## 2017-04-23 DIAGNOSIS — Z7982 Long term (current) use of aspirin: Secondary | ICD-10-CM | POA: Diagnosis not present

## 2017-04-23 DIAGNOSIS — I739 Peripheral vascular disease, unspecified: Secondary | ICD-10-CM | POA: Diagnosis not present

## 2017-04-23 DIAGNOSIS — N186 End stage renal disease: Secondary | ICD-10-CM | POA: Diagnosis not present

## 2017-04-23 DIAGNOSIS — Z8673 Personal history of transient ischemic attack (TIA), and cerebral infarction without residual deficits: Secondary | ICD-10-CM | POA: Diagnosis not present

## 2017-04-23 DIAGNOSIS — M1990 Unspecified osteoarthritis, unspecified site: Secondary | ICD-10-CM | POA: Diagnosis not present

## 2017-04-23 DIAGNOSIS — Z992 Dependence on renal dialysis: Secondary | ICD-10-CM | POA: Insufficient documentation

## 2017-04-23 DIAGNOSIS — R000 Tachycardia, unspecified: Secondary | ICD-10-CM | POA: Diagnosis not present

## 2017-04-23 DIAGNOSIS — I120 Hypertensive chronic kidney disease with stage 5 chronic kidney disease or end stage renal disease: Secondary | ICD-10-CM | POA: Diagnosis present

## 2017-04-23 HISTORY — DX: Cardiac murmur, unspecified: R01.1

## 2017-04-23 HISTORY — DX: Heart failure, unspecified: I50.9

## 2017-04-23 HISTORY — PX: BASCILIC VEIN TRANSPOSITION: SHX5742

## 2017-04-23 HISTORY — DX: Anemia, unspecified: D64.9

## 2017-04-23 LAB — BASIC METABOLIC PANEL
Anion gap: 17 — ABNORMAL HIGH (ref 5–15)
BUN: 52 mg/dL — ABNORMAL HIGH (ref 6–20)
CALCIUM: 8.4 mg/dL — AB (ref 8.9–10.3)
CHLORIDE: 102 mmol/L (ref 101–111)
CO2: 21 mmol/L — AB (ref 22–32)
CREATININE: 8.3 mg/dL — AB (ref 0.44–1.00)
GFR calc Af Amer: 5 mL/min — ABNORMAL LOW (ref >60)
GFR calc non Af Amer: 4 mL/min — ABNORMAL LOW (ref >60)
GLUCOSE: 75 mg/dL (ref 65–99)
Potassium: 4.5 mmol/L (ref 3.5–5.1)
Sodium: 140 mmol/L (ref 135–145)

## 2017-04-23 LAB — CREATININE, SERUM
CREATININE: 8.57 mg/dL — AB (ref 0.44–1.00)
GFR calc Af Amer: 5 mL/min — ABNORMAL LOW (ref >60)
GFR, EST NON AFRICAN AMERICAN: 4 mL/min — AB (ref >60)

## 2017-04-23 LAB — CBC
HCT: 34.9 % — ABNORMAL LOW (ref 36.0–46.0)
HCT: 33.6 % — ABNORMAL LOW (ref 36.0–46.0)
Hemoglobin: 10.8 g/dL — ABNORMAL LOW (ref 12.0–15.0)
Hemoglobin: 10.4 g/dL — ABNORMAL LOW (ref 12.0–15.0)
MCH: 31.1 pg (ref 26.0–34.0)
MCH: 30.8 pg (ref 26.0–34.0)
MCHC: 30.9 g/dL (ref 30.0–36.0)
MCHC: 31 g/dL (ref 30.0–36.0)
MCV: 100.6 fL — ABNORMAL HIGH (ref 78.0–100.0)
MCV: 99.4 fL (ref 78.0–100.0)
PLATELETS: 130 10*3/uL — AB (ref 150–400)
PLATELETS: 136 10*3/uL — AB (ref 150–400)
RBC: 3.47 MIL/uL — ABNORMAL LOW (ref 3.87–5.11)
RBC: 3.38 MIL/uL — ABNORMAL LOW (ref 3.87–5.11)
RDW: 17.4 % — AB (ref 11.5–15.5)
RDW: 17.2 % — AB (ref 11.5–15.5)
WBC: 5.1 10*3/uL (ref 4.0–10.5)
WBC: 4.5 10*3/uL (ref 4.0–10.5)

## 2017-04-23 LAB — POCT I-STAT 4, (NA,K, GLUC, HGB,HCT)
GLUCOSE: 84 mg/dL (ref 65–99)
HEMATOCRIT: 36 % (ref 36.0–46.0)
Hemoglobin: 12.2 g/dL (ref 12.0–15.0)
Potassium: 4.4 mmol/L (ref 3.5–5.1)
SODIUM: 139 mmol/L (ref 135–145)

## 2017-04-23 SURGERY — TRANSPOSITION, VEIN, BASILIC
Anesthesia: General | Site: Arm Upper | Laterality: Left

## 2017-04-23 MED ORDER — ENOXAPARIN SODIUM 30 MG/0.3ML ~~LOC~~ SOLN: 30.0000 mg | SUBCUTANEOUS | Status: DC

## 2017-04-23 MED ORDER — SODIUM CHLORIDE 0.9 % IV SOLN: 100.0000 mL | INTRAVENOUS | Status: DC | PRN

## 2017-04-23 MED ORDER — SODIUM CHLORIDE 0.9 % IV SOLN
INTRAVENOUS | Status: DC | PRN
  Administered 2017-04-23: 500 mL

## 2017-04-23 MED ORDER — ONDANSETRON HCL 4 MG/2ML IJ SOLN
INTRAMUSCULAR | Status: DC | PRN
  Administered 2017-04-23: 4 mg via INTRAVENOUS

## 2017-04-23 MED ORDER — LIDOCAINE HCL (CARDIAC) 20 MG/ML IV SOLN
INTRAVENOUS | Status: DC | PRN
  Administered 2017-04-23: 60 mg via INTRAVENOUS

## 2017-04-23 MED ORDER — CARVEDILOL 12.5 MG PO TABS
ORAL_TABLET | ORAL | Status: AC
  Administered 2017-04-23: 12.5 mg via ORAL
  Filled 2017-04-23: qty 1

## 2017-04-23 MED ORDER — ALUM & MAG HYDROXIDE-SIMETH 200-200-20 MG/5ML PO SUSP: 15.0000 mL | ORAL | Status: DC | PRN

## 2017-04-23 MED ORDER — SODIUM CHLORIDE 0.9 % IV SOLN: 250.0000 mL | INTRAVENOUS | Status: DC | PRN

## 2017-04-23 MED ORDER — NITROGLYCERIN 0.4 MG SL SUBL
0.4000 mg | SUBLINGUAL_TABLET | SUBLINGUAL | Status: DC | PRN
Start: 2017-04-23 — End: 2017-04-24

## 2017-04-23 MED ORDER — CARVEDILOL 12.5 MG PO TABS
12.5000 mg | ORAL_TABLET | Freq: Two times a day (BID) | ORAL | Status: DC
  Administered 2017-04-23: 12.5 mg via ORAL
  Filled 2017-04-23 (×2): qty 1

## 2017-04-23 MED ORDER — METOPROLOL TARTRATE 5 MG/5ML IV SOLN: 2.0000 mg | INTRAVENOUS | Status: DC | PRN

## 2017-04-23 MED ORDER — FENTANYL CITRATE (PF) 250 MCG/5ML IJ SOLN
INTRAMUSCULAR | Status: AC
  Filled 2017-04-23: qty 5

## 2017-04-23 MED ORDER — CALCIUM CHLORIDE 10 % IV SOLN
INTRAVENOUS | Status: DC | PRN
Start: 2017-04-23 — End: 2017-04-23
  Administered 2017-04-23 (×3): 100 mg via INTRAVENOUS

## 2017-04-23 MED ORDER — FENTANYL CITRATE (PF) 100 MCG/2ML IJ SOLN
INTRAMUSCULAR | Status: AC
  Filled 2017-04-23: qty 2

## 2017-04-23 MED ORDER — PROPOFOL 10 MG/ML IV BOLUS
INTRAVENOUS | Status: DC | PRN
  Administered 2017-04-23: 60 mg via INTRAVENOUS
  Administered 2017-04-23: 40 mg via INTRAVENOUS

## 2017-04-23 MED ORDER — ASPIRIN EC 81 MG PO TBEC
81.0000 mg | DELAYED_RELEASE_TABLET | Freq: Every day | ORAL | Status: DC
  Administered 2017-04-24: 81 mg via ORAL
  Filled 2017-04-23: qty 1

## 2017-04-23 MED ORDER — CALCITRIOL 0.25 MCG PO CAPS
0.2500 ug | ORAL_CAPSULE | Freq: Every day | ORAL | Status: DC
  Administered 2017-04-23 – 2017-04-24 (×2): 0.25 ug via ORAL
  Filled 2017-04-23 (×2): qty 1

## 2017-04-23 MED ORDER — HEPARIN SODIUM (PORCINE) 1000 UNIT/ML DIALYSIS: 20.0000 [IU]/kg | INTRAMUSCULAR | Status: DC | PRN

## 2017-04-23 MED ORDER — SEVELAMER CARBONATE 800 MG PO TABS
800.0000 mg | ORAL_TABLET | Freq: Three times a day (TID) | ORAL | Status: DC
  Administered 2017-04-23 – 2017-04-24 (×2): 800 mg via ORAL
  Filled 2017-04-23 (×2): qty 1

## 2017-04-23 MED ORDER — PHENYLEPHRINE HCL 10 MG/ML IJ SOLN
INTRAMUSCULAR | Status: DC | PRN
  Administered 2017-04-23 (×2): 80 ug via INTRAVENOUS

## 2017-04-23 MED ORDER — 0.9 % SODIUM CHLORIDE (POUR BTL) OPTIME
TOPICAL | Status: DC | PRN
  Administered 2017-04-23: 1000 mL

## 2017-04-23 MED ORDER — ALTEPLASE 2 MG IJ SOLR: 2.0000 mg | Freq: Once | INTRAMUSCULAR | Status: DC | PRN

## 2017-04-23 MED ORDER — SODIUM CHLORIDE 0.9% FLUSH: 3.0000 mL | INTRAVENOUS | Status: DC | PRN

## 2017-04-23 MED ORDER — ONDANSETRON HCL 4 MG/2ML IJ SOLN: 4.0000 mg | Freq: Four times a day (QID) | INTRAMUSCULAR | Status: DC | PRN

## 2017-04-23 MED ORDER — GUAIFENESIN-DM 100-10 MG/5ML PO SYRP: 15.0000 mL | ORAL_SOLUTION | ORAL | Status: DC | PRN

## 2017-04-23 MED ORDER — HYDRALAZINE HCL 20 MG/ML IJ SOLN: 5.0000 mg | INTRAMUSCULAR | Status: DC | PRN

## 2017-04-23 MED ORDER — SODIUM CHLORIDE 0.9% FLUSH
3.0000 mL | Freq: Two times a day (BID) | INTRAVENOUS | Status: DC
  Administered 2017-04-24: 3 mL via INTRAVENOUS

## 2017-04-23 MED ORDER — LABETALOL HCL 5 MG/ML IV SOLN: 10.0000 mg | INTRAVENOUS | Status: DC | PRN

## 2017-04-23 MED ORDER — CARVEDILOL 12.5 MG PO TABS
12.5000 mg | ORAL_TABLET | Freq: Two times a day (BID) | ORAL | Status: DC
  Administered 2017-04-23: 12.5 mg via ORAL

## 2017-04-23 MED ORDER — FENTANYL CITRATE (PF) 100 MCG/2ML IJ SOLN
INTRAMUSCULAR | Status: DC | PRN
  Administered 2017-04-23 (×2): 25 ug via INTRAVENOUS

## 2017-04-23 MED ORDER — DEXAMETHASONE SODIUM PHOSPHATE 10 MG/ML IJ SOLN
INTRAMUSCULAR | Status: DC | PRN
  Administered 2017-04-23: 10 mg via INTRAVENOUS

## 2017-04-23 MED ORDER — PHENOL 1.4 % MT LIQD: 1.0000 | OROMUCOSAL | Status: DC | PRN

## 2017-04-23 MED ORDER — SODIUM CHLORIDE 0.9 % IV SOLN
INTRAVENOUS | Status: DC
  Administered 2017-04-23 (×2): via INTRAVENOUS

## 2017-04-23 MED ORDER — FENTANYL CITRATE (PF) 100 MCG/2ML IJ SOLN
25.0000 ug | INTRAMUSCULAR | Status: DC | PRN
  Administered 2017-04-23: 25 ug via INTRAVENOUS

## 2017-04-23 MED ORDER — PANTOPRAZOLE SODIUM 40 MG PO TBEC
40.0000 mg | DELAYED_RELEASE_TABLET | Freq: Every day | ORAL | Status: DC
  Administered 2017-04-24: 40 mg via ORAL
  Filled 2017-04-23: qty 1

## 2017-04-23 MED ORDER — AMLODIPINE BESYLATE 10 MG PO TABS
10.0000 mg | ORAL_TABLET | Freq: Every day | ORAL | Status: DC
  Administered 2017-04-23 – 2017-04-24 (×2): 10 mg via ORAL
  Filled 2017-04-23 (×2): qty 1

## 2017-04-23 MED ORDER — CLONIDINE HCL 0.2 MG/24HR TD PTWK: 0.2000 mg | MEDICATED_PATCH | TRANSDERMAL | Status: DC

## 2017-04-23 MED ORDER — CALCIUM ACETATE (PHOS BINDER) 667 MG PO CAPS
1334.0000 mg | ORAL_CAPSULE | Freq: Three times a day (TID) | ORAL | Status: DC
  Administered 2017-04-23 – 2017-04-24 (×2): 1334 mg via ORAL
  Filled 2017-04-23 (×2): qty 2

## 2017-04-23 MED ORDER — LIDOCAINE-EPINEPHRINE 0.5 %-1:200000 IJ SOLN
INTRAMUSCULAR | Status: AC
  Filled 2017-04-23: qty 1

## 2017-04-23 MED ORDER — ACETAMINOPHEN 500 MG PO TABS
1000.0000 mg | ORAL_TABLET | Freq: Three times a day (TID) | ORAL | Status: DC | PRN
  Administered 2017-04-23: 1000 mg via ORAL
  Filled 2017-04-23: qty 2

## 2017-04-23 MED ORDER — DEXTROSE 5 % IV SOLN
1.5000 g | INTRAVENOUS | Status: AC
  Administered 2017-04-23: 1.5 g via INTRAVENOUS
  Filled 2017-04-23: qty 1.5

## 2017-04-23 MED ORDER — TRAMADOL HCL 50 MG PO TABS
50.0000 mg | ORAL_TABLET | Freq: Four times a day (QID) | ORAL | Status: DC | PRN
  Administered 2017-04-23 – 2017-04-24 (×3): 50 mg via ORAL
  Filled 2017-04-23 (×3): qty 1

## 2017-04-23 MED ORDER — POTASSIUM CHLORIDE CRYS ER 20 MEQ PO TBCR: 20.0000 meq | EXTENDED_RELEASE_TABLET | Freq: Once | ORAL | Status: DC

## 2017-04-23 MED ORDER — PHENYLEPHRINE HCL 10 MG/ML IJ SOLN
INTRAVENOUS | Status: DC | PRN
  Administered 2017-04-23: 40 ug/min via INTRAVENOUS
  Administered 2017-04-23: 13:00:00 via INTRAVENOUS

## 2017-04-23 MED ORDER — CINACALCET HCL 30 MG PO TABS: 60.0000 mg | ORAL_TABLET | Freq: Every day | ORAL | Status: DC

## 2017-04-23 SURGICAL SUPPLY — 36 items
ADH SKN CLS APL DERMABOND .7 (GAUZE/BANDAGES/DRESSINGS) ×1
ARMBAND PINK RESTRICT EXTREMIT (MISCELLANEOUS) ×3 IMPLANT
CANISTER SUCT 3000ML PPV (MISCELLANEOUS) ×3 IMPLANT
CANNULA VESSEL 3MM 2 BLNT TIP (CANNULA) ×3 IMPLANT
CLIP LIGATING EXTRA MED SLVR (CLIP) ×3 IMPLANT
CLIP LIGATING EXTRA SM BLUE (MISCELLANEOUS) ×3 IMPLANT
COVER PROBE W GEL 5X96 (DRAPES) ×3 IMPLANT
DECANTER SPIKE VIAL GLASS SM (MISCELLANEOUS) ×3 IMPLANT
DERMABOND ADVANCED (GAUZE/BANDAGES/DRESSINGS) ×2
DERMABOND ADVANCED .7 DNX12 (GAUZE/BANDAGES/DRESSINGS) ×1 IMPLANT
ELECT REM PT RETURN 9FT ADLT (ELECTROSURGICAL) ×3
ELECTRODE REM PT RTRN 9FT ADLT (ELECTROSURGICAL) ×1 IMPLANT
GLOVE BIOGEL PI IND STRL 6.5 (GLOVE) IMPLANT
GLOVE BIOGEL PI IND STRL 7.5 (GLOVE) IMPLANT
GLOVE BIOGEL PI INDICATOR 6.5 (GLOVE) ×4
GLOVE BIOGEL PI INDICATOR 7.5 (GLOVE) ×2
GLOVE ECLIPSE 6.5 STRL STRAW (GLOVE) ×4 IMPLANT
GLOVE ECLIPSE 7.0 STRL STRAW (GLOVE) ×2 IMPLANT
GLOVE SS BIOGEL STRL SZ 7.5 (GLOVE) ×1 IMPLANT
GLOVE SUPERSENSE BIOGEL SZ 7.5 (GLOVE) ×2
GLOVE SURG SS PI 6.5 STRL IVOR (GLOVE) ×2 IMPLANT
GOWN STRL REUS W/ TWL LRG LVL3 (GOWN DISPOSABLE) ×3 IMPLANT
GOWN STRL REUS W/TWL LRG LVL3 (GOWN DISPOSABLE) ×9
KIT BASIN OR (CUSTOM PROCEDURE TRAY) ×3 IMPLANT
KIT ROOM TURNOVER OR (KITS) ×3 IMPLANT
NS IRRIG 1000ML POUR BTL (IV SOLUTION) ×3 IMPLANT
PACK CV ACCESS (CUSTOM PROCEDURE TRAY) ×3 IMPLANT
PAD ARMBOARD 7.5X6 YLW CONV (MISCELLANEOUS) ×6 IMPLANT
SUT PROLENE 6 0 CC (SUTURE) ×3 IMPLANT
SUT SILK 2 0 SH (SUTURE) IMPLANT
SUT SILK 3 0 (SUTURE) ×3
SUT SILK 3-0 18XBRD TIE 12 (SUTURE) IMPLANT
SUT VIC AB 3-0 SH 27 (SUTURE) ×6
SUT VIC AB 3-0 SH 27X BRD (SUTURE) ×1 IMPLANT
UNDERPAD 30X30 (UNDERPADS AND DIAPERS) ×3 IMPLANT
WATER STERILE IRR 1000ML POUR (IV SOLUTION) ×3 IMPLANT

## 2017-04-23 NOTE — Anesthesia Procedure Notes (Addendum)
Procedure Name: LMA Insertion Date/Time: 04/23/2017 11:07 AM Performed by: Valda Favia Pre-anesthesia Checklist: Patient identified, Emergency Drugs available, Suction available, Patient being monitored and Timeout performed Patient Re-evaluated:Patient Re-evaluated prior to induction Oxygen Delivery Method: Circle system utilized Preoxygenation: Pre-oxygenation with 100% oxygen Induction Type: IV induction Ventilation: Mask ventilation without difficulty LMA: LMA inserted LMA Size: 4.0 Number of attempts: 1 Placement Confirmation: positive ETCO2 and breath sounds checked- equal and bilateral Tube secured with: Tape Dental Injury: Teeth and Oropharynx as per pre-operative assessment

## 2017-04-23 NOTE — Transfer of Care (Signed)
Immediate Anesthesia Transfer of Care Note  Patient: [place]  Procedure(s) Performed: BASILIC VEIN TRANSPOSITION SECOND STAGE (Left Arm Upper)  Patient Location: PACU  Anesthesia Type:General  Level of Consciousness: drowsy  Airway & Oxygen Therapy: Patient Spontanous Breathing and Patient connected to nasal cannula oxygen  Post-op Assessment: Report given to RN and Post -op Vital signs reviewed and stable  Post vital signs: Reviewed and stable  Last Vitals:  Vitals:   04/23/17 0929 04/23/17 1258  BP: 139/60   Pulse: 96   Resp: 18   Temp: 36.6 C 36.5 C  SpO2: 100%     Last Pain:  Vitals:   04/23/17 0929  TempSrc: Oral      Patients Stated Pain Goal: 0 (30/14/15 9733)  Complications: No apparent anesthesia complications

## 2017-04-23 NOTE — Anesthesia Preprocedure Evaluation (Addendum)
Anesthesia Evaluation  Patient identified by MRN, date of birth, ID band Patient awake    Reviewed: Allergy & Precautions, H&P , NPO status , Patient's Chart, lab work & pertinent test results  Airway Mallampati: III  TM Distance: >3 FB Neck ROM: Full    Dental no notable dental hx. (+) Edentulous Upper, Edentulous Lower, Dental Advisory Given   Pulmonary neg pulmonary ROS,    Pulmonary exam normal breath sounds clear to auscultation       Cardiovascular hypertension, Pt. on medications and Pt. on home beta blockers + Peripheral Vascular Disease  + Valvular Problems/Murmurs AS  Rhythm:Regular Rate:Normal + Systolic murmurs    Neuro/Psych  Headaches, CVA negative psych ROS   GI/Hepatic Neg liver ROS, GERD  Medicated and Controlled,  Endo/Other  negative endocrine ROS  Renal/GU Renal InsufficiencyRenal disease  negative genitourinary   Musculoskeletal  (+) Arthritis , Osteoarthritis,    Abdominal   Peds  Hematology negative hematology ROS (+) anemia ,   Anesthesia Other Findings   Reproductive/Obstetrics negative OB ROS                            Anesthesia Physical Anesthesia Plan  ASA: III  Anesthesia Plan: General   Post-op Pain Management:    Induction: Intravenous  PONV Risk Score and Plan: 4 or greater and Ondansetron, Dexamethasone and Midazolam  Airway Management Planned: LMA  Additional Equipment:   Intra-op Plan:   Post-operative Plan: Extubation in OR  Informed Consent: I have reviewed the patients History and Physical, chart, labs and discussed the procedure including the risks, benefits and alternatives for the proposed anesthesia with the patient or authorized representative who has indicated his/her understanding and acceptance.   Dental advisory given  Plan Discussed with: CRNA  Anesthesia Plan Comments:         Anesthesia Quick Evaluation

## 2017-04-23 NOTE — Op Note (Signed)
    OPERATIVE REPORT  DATE OF SURGERY: 04/23/2017  PATIENT: Nancy [name], 76 y.o. female MRN: [id number]  DOB: June 04, 1941  PRE-OPERATIVE DIAGNOSIS: End-stage renal disease  POST-OPERATIVE DIAGNOSIS:  Same  PROCEDURE: Second stage left basilic vein transposition fistula  SURGEON:  Curt Jews, M.D.  PHYSICIAN ASSISTANT:Collins PA-C  ANESTHESIA: General  EBL: 20 ml  Total I/O In: 600 [I.V.:600] Out: 20 [Blood:20]  BLOOD ADMINISTERED: None  DRAINS: None  SPECIMEN: None  COUNTS CORRECT:  YES  PLAN OF CARE: PACU  PATIENT DISPOSITION:  PACU - hemodynamically stable  PROCEDURE DETAILS: The patient was taken to the operating placed in supine position where the area of the left arm and left axilla were prepped and draped in the usual sterile fashion.  Sinusitis visualization was used to identify the level of the basilic vein from the antecubital space to the axilla.  Incision was made over the prior scar at the antecubital space and carried down to isolate the basilic vein to brachial artery anastomosis.  Separate incision was made in the mid upper arm basilic vein fistula and then over the axilla.  The basilic vein was mobilized and tributary branches were ligated from the antecubital space to the axilla.  A tunnel was created from the antecubital space in the subcuticular space up to the axilla.  The basilic vein was occluded near the prior brachial artery anastomosis and the basilic vein was transected.  The vein was brought through the subcutaneous tissue and and had been marked to reduce risk of twisting.  The vein was gently dilated and was of excellent size.  The vein was brought back through the prior created tunnel.  The vein was cut to the appropriate length and was spatulated and sewn into and to the basilic vein at the brachial artery anastomosis with a running 6-0 Prolene suture.  Clamps were removed and excellent thrill was noted.  The wounds were irrigated with saline.   Hemostasis obtained with cautery.  The wounds were closed with 3-0 Vicryl in the subcutaneous and subcuticular tissue.  Sterile dressing was applied and the patient was transferred to the recovery room in stable condition   Rosetta Posner, M.D., [facility] 04/23/2017 1:24 PM

## 2017-04-23 NOTE — Telephone Encounter (Signed)
SChed appt 06/03/17 at 3:00. Lm on cell#.

## 2017-04-23 NOTE — Telephone Encounter (Signed)
-----   Message from Mena Goes, RN sent at 04/23/2017  1:20 PM EDT ----- Regarding: 6 weeks    ----- Message ----- From: Ulyses Amor, PA-C Sent: 04/23/2017   1:05 PM To: Vvs Charge Pool  F/U with Dr. Donnetta Hutching in 6 weeks s/p basilic fistula transposition.  No study needed.

## 2017-04-23 NOTE — Telephone Encounter (Signed)
Can we also fax this information to her nephrologist.  I think they should be handling issues directly related to her ESRD medications.  Sometimes they give samples at HD as well.  Thanks

## 2017-04-23 NOTE — Interval H&P Note (Signed)
History and Physical Interval Note:  04/23/2017 9:12 AM  Nancy [name]  has presented today for surgery, with the diagnosis of end stage renal disease  The various methods of treatment have been discussed with the patient and family. After consideration of risks, benefits and other options for treatment, the patient has consented to  Procedure(s): BASILIC VEIN TRANSPOSITION SECOND STAGE (Left) as a surgical intervention .  The patient's history has been reviewed, patient examined, no change in status, stable for surgery.  I have reviewed the patient's chart and labs.  Questions were answered to the patient's satisfaction.     Curt Jews

## 2017-04-23 NOTE — H&P (View-Only) (Signed)
Patient name: Nancy [name] MRN: [id number] DOB: 04/28/1941 Sex: female  REASON FOR VISIT: Stress second stage basilic vein transposition fistula  HPI: Nancy [name] is a 76 y.o. female here today with her daughter for discussion of second stage basilic vein transposition fistula. She is well-known to me from placement of a right IJ hemodialysis catheter and first stage brachial basilic fistula creation on 02/19/2017. She has done well since her discharge from the hospital. They report no difficulty with her hemodialysis catheter. They do report she does is having difficulty with hypotension around the time of her hemodialysis. Denies any hand symptoms specifically related to steal. She's had complete resolution of soreness at her antecubital space from her incision  Current Outpatient Prescriptions  Medication Sig Dispense Refill  . acetaminophen (TYLENOL) 500 MG tablet Take 1,000 mg by mouth every 8 (eight) hours as needed for mild pain, moderate pain, fever or headache.     Marland Kitchen aspirin EC 81 MG EC tablet Take 1 tablet (81 mg total) by mouth daily. 30 tablet 1  . calcitRIOL (ROCALTROL) 0.25 MCG capsule Take 1 capsule (0.25 mcg total) by mouth daily. 90 capsule 0  . calcium acetate (PHOSLO) 667 MG capsule Take 2 capsules (1,334 mg total) by mouth 3 (three) times daily with meals. 90 capsule 1  . cloNIDine (CATAPRES - DOSED IN MG/24 HR) 0.2 mg/24hr patch Place 1 patch (0.2 mg total) onto the skin every 7 (seven) days. 12 patch 2  . traMADol (ULTRAM) 50 MG tablet Take 1 tablet (50 mg total) by mouth every 8 (eight) hours as needed for severe pain. 10 tablet 0  . amLODipine (NORVASC) 10 MG tablet Take 10 mg by mouth daily.  5  . carvedilol (COREG) 12.5 MG tablet Take 12.5 mg by mouth 2 (two) times daily.    . nitroGLYCERIN (NITROSTAT) 0.3 MG SL tablet Place 1 tablet (0.3 mg total) under the tongue every 5 (five) minutes as needed for chest pain. (Patient not  taking: Reported on 04/08/2017) 30 tablet 0  . SENSIPAR 60 MG tablet Take 1 tablet (60 mg total) by mouth daily. (Patient not taking: Reported on 04/08/2017) 30 tablet 6  . sevelamer carbonate (RENVELA) 800 MG tablet Take 800 mg by mouth 3 (three) times daily.  5   No current facility-administered medications for this visit.      PHYSICAL EXAM: Vitals:   04/08/17 1046  BP: (!) 103/58  Pulse: 65  Resp: 16  SpO2: 100%  Weight: 123 lb 4.8 oz (55.9 kg)  Height: 5\' 2"  (1.575 m)    GENERAL: The patient is a well-nourished female, in no acute distress. The vital signs are documented above. Excellent thrill in the left upper arm basilic vein fistula. Complete healing of the antecubital incision  No evidence of infection around her right IJ hemodialysis catheter  Duplex today shows size maturation is quite nicely and her basilic vein fistula. I imaged the fistula myself with SonoSite ultrasound and confirmed a very nice size maturation throughout its course.  MEDICAL ISSUES: Stable overall. We'll plan second stage basilic vein transposition fistula. Explained this as an outpatient and explained the procedure. I feel that she has an excellent chance of having a good use of her upper arm fistula due to the nice size maturation of her basilic vein. We will schedule this at her earliest convenience and expected recovery.   Rosetta Posner, MD FACS [facility] [phone] Pager [phone]  [phone] 

## 2017-04-23 NOTE — Anesthesia Postprocedure Evaluation (Signed)
Anesthesia Post Note  Patient: Nancy [name]  Procedure(s) Performed: BASILIC VEIN TRANSPOSITION SECOND STAGE (Left Arm Upper)     Patient location during evaluation: PACU Anesthesia Type: General Level of consciousness: awake and alert Pain management: pain level controlled Vital Signs Assessment: post-procedure vital signs reviewed and stable Respiratory status: spontaneous breathing, nonlabored ventilation and respiratory function stable Cardiovascular status: blood pressure returned to baseline and stable Postop Assessment: no apparent nausea or vomiting Anesthetic complications: no    Last Vitals:  Vitals:   04/23/17 1330 04/23/17 1342  BP:  126/64  Pulse: 82 84  Resp: (!) 26 11  Temp:    SpO2: 96% 97%    Last Pain:  Vitals:   04/23/17 1315  TempSrc:   PainSc: 10-Worst pain ever                 Clydean Posas,W. EDMOND

## 2017-04-24 ENCOUNTER — Encounter (HOSPITAL_COMMUNITY): Payer: Self-pay | Admitting: Vascular Surgery

## 2017-04-24 DIAGNOSIS — M1990 Unspecified osteoarthritis, unspecified site: Secondary | ICD-10-CM | POA: Diagnosis not present

## 2017-04-24 DIAGNOSIS — Z8673 Personal history of transient ischemic attack (TIA), and cerebral infarction without residual deficits: Secondary | ICD-10-CM | POA: Diagnosis not present

## 2017-04-24 DIAGNOSIS — D631 Anemia in chronic kidney disease: Secondary | ICD-10-CM | POA: Diagnosis not present

## 2017-04-24 DIAGNOSIS — I739 Peripheral vascular disease, unspecified: Secondary | ICD-10-CM | POA: Diagnosis not present

## 2017-04-24 DIAGNOSIS — K219 Gastro-esophageal reflux disease without esophagitis: Secondary | ICD-10-CM | POA: Diagnosis not present

## 2017-04-24 DIAGNOSIS — I132 Hypertensive heart and chronic kidney disease with heart failure and with stage 5 chronic kidney disease, or end stage renal disease: Secondary | ICD-10-CM | POA: Diagnosis not present

## 2017-04-24 DIAGNOSIS — I120 Hypertensive chronic kidney disease with stage 5 chronic kidney disease or end stage renal disease: Secondary | ICD-10-CM | POA: Diagnosis not present

## 2017-04-24 DIAGNOSIS — I509 Heart failure, unspecified: Secondary | ICD-10-CM | POA: Diagnosis not present

## 2017-04-24 DIAGNOSIS — Z7982 Long term (current) use of aspirin: Secondary | ICD-10-CM | POA: Diagnosis not present

## 2017-04-24 DIAGNOSIS — I953 Hypotension of hemodialysis: Secondary | ICD-10-CM | POA: Diagnosis not present

## 2017-04-24 DIAGNOSIS — N2581 Secondary hyperparathyroidism of renal origin: Secondary | ICD-10-CM | POA: Diagnosis not present

## 2017-04-24 DIAGNOSIS — Z992 Dependence on renal dialysis: Secondary | ICD-10-CM | POA: Diagnosis not present

## 2017-04-24 DIAGNOSIS — N186 End stage renal disease: Secondary | ICD-10-CM | POA: Diagnosis not present

## 2017-04-24 LAB — RENAL FUNCTION PANEL
ALBUMIN: 2.6 g/dL — AB (ref 3.5–5.0)
ALBUMIN: 2.7 g/dL — AB (ref 3.5–5.0)
Anion gap: 15 (ref 5–15)
Anion gap: 16 — ABNORMAL HIGH (ref 5–15)
BUN: 59 mg/dL — AB (ref 6–20)
BUN: 58 mg/dL — AB (ref 6–20)
CALCIUM: 8.2 mg/dL — AB (ref 8.9–10.3)
CALCIUM: 8.1 mg/dL — AB (ref 8.9–10.3)
CO2: 23 mmol/L (ref 22–32)
CO2: 20 mmol/L — ABNORMAL LOW (ref 22–32)
Chloride: 100 mmol/L — ABNORMAL LOW (ref 101–111)
Chloride: 102 mmol/L (ref 101–111)
Creatinine, Ser: 8.79 mg/dL — ABNORMAL HIGH (ref 0.44–1.00)
Creatinine, Ser: 9.07 mg/dL — ABNORMAL HIGH (ref 0.44–1.00)
GFR calc Af Amer: 5 mL/min — ABNORMAL LOW (ref >60)
GFR calc Af Amer: 4 mL/min — ABNORMAL LOW (ref >60)
GFR calc non Af Amer: 4 mL/min — ABNORMAL LOW (ref >60)
GFR calc non Af Amer: 4 mL/min — ABNORMAL LOW (ref >60)
GLUCOSE: 162 mg/dL — AB (ref 65–99)
GLUCOSE: 113 mg/dL — AB (ref 65–99)
PHOSPHORUS: 9.6 mg/dL — AB (ref 2.5–4.6)
PHOSPHORUS: 9.7 mg/dL — AB (ref 2.5–4.6)
Potassium: 4.8 mmol/L (ref 3.5–5.1)
Potassium: 4.9 mmol/L (ref 3.5–5.1)
SODIUM: 138 mmol/L (ref 135–145)
SODIUM: 138 mmol/L (ref 135–145)

## 2017-04-24 LAB — CBC
HCT: 34.6 % — ABNORMAL LOW (ref 36.0–46.0)
Hemoglobin: 10.7 g/dL — ABNORMAL LOW (ref 12.0–15.0)
MCH: 30.7 pg (ref 26.0–34.0)
MCHC: 30.9 g/dL (ref 30.0–36.0)
MCV: 99.1 fL (ref 78.0–100.0)
Platelets: 128 10*3/uL — ABNORMAL LOW (ref 150–400)
RBC: 3.49 MIL/uL — ABNORMAL LOW (ref 3.87–5.11)
RDW: 17 % — AB (ref 11.5–15.5)
WBC: 5.1 10*3/uL (ref 4.0–10.5)

## 2017-04-24 MED ORDER — PRO-STAT SUGAR FREE PO LIQD
30.0000 mL | Freq: Two times a day (BID) | ORAL | Status: DC
  Administered 2017-04-24: 30 mL via ORAL
  Filled 2017-04-24: qty 30

## 2017-04-24 MED ORDER — TRAMADOL HCL 50 MG PO TABS
50.0000 mg | ORAL_TABLET | Freq: Four times a day (QID) | ORAL | 0 refills | Status: DC | PRN
Start: 2017-04-24 — End: 2017-09-20

## 2017-04-24 MED ORDER — TRAMADOL HCL 50 MG PO TABS: 50.0000 mg | ORAL_TABLET | Freq: Four times a day (QID) | ORAL | 0 refills | Status: DC | PRN

## 2017-04-24 MED ORDER — RENA-VITE PO TABS: 1.0000 | ORAL_TABLET | Freq: Every day | ORAL | Status: DC

## 2017-04-24 NOTE — Consult Note (Addendum)
[facility] Renal Consultation Note    Indication for Consultation:  Management of ESRD/hemodialysis; anemia, hypertension/volume and secondary hyperparathyroidism  MEQ:ASTMHD, Peri Jefferson, MD  HPI: Nancy [name] is a 76 y.o. female. ESRD 2/2 HTN on HD MWF at [facility], first starting on 02/19/17.  Past medical history significant for HTN, CVA, aortic stenosis, and CHF.  Patient has not been as compliant lately due to the death of her husband about a week ago. Last HD on Monday, she completed full treatment without complications.   Patient was admitted following 2nd stage basilic vein transposition. Seen and examined at bedside during dialysis. Complains of 8/10 pain in left arm. Denies SOB, n/v/d, chest pain, dizziness and weakness.   Past Medical History:  Diagnosis Date  . Anemia   . Aortic stenosis   . Bacterial sinusitis 09/17/2011  . CHF (congestive heart failure) ([place])   . CKD (chronic kidney disease) stage 4, GFR 15-29 ml/min ([place]) 08/11/2006   Cr continues to increase. Proteinuria on UA 02/10/12.    . Colitis   . CVA (cerebrovascular accident) [facility])    New hemorrhagic per CT scan '09  . Diverticulosis of colon   . Dysfunctional uterine bleeding   . Fecal impaction ([place])   . Headache(784.0)   . Heart murmur   . HERNIORRHAPHY, HX OF 08/11/2006  . Hypertension   . OA (osteoarthritis)    bilateral knees  . Postmenopausal   . Pulmonary nodule   . TINEA CRURIS 01/12/2007   Past Surgical History:  Procedure Laterality Date  . ABDOMINAL HYSTERECTOMY    . AV FISTULA PLACEMENT Left 02/19/2017   Procedure: CREATION OF LEFT ARM BRACHIOCEPHALIC ARTERIOVENOUS (AV) FISTULA;  Surgeon: Rosetta Posner, MD;  Location: [place];  Service: Vascular;  Laterality: Left;  . BASCILIC VEIN TRANSPOSITION Left 04/23/2017   Procedure: BASILIC VEIN TRANSPOSITION SECOND STAGE;  Surgeon: Rosetta Posner, MD;  Location: [place];  Service: Vascular;  Laterality: Left;  . CHOLECYSTECTOMY   2009  . COLONOSCOPY    . INGUINAL HERNIA REPAIR  2008  . INSERTION OF DIALYSIS CATHETER Right 02/19/2017   Procedure: INSERTION OF TUNNELED DIALYSIS CATHETER - RIGHT INTERNAL JUGULAR PLACEMENT;  Surgeon: Rosetta Posner, MD;  Location: [place];  Service: Vascular;  Laterality: Right;  . IRIDOTOMY / IRIDECTOMY     Laser, right eye 12/26/11 left eye 01/24/12  . MASS EXCISION Left 05/07/2013   Procedure: EXCISION CYST;  Surgeon: Myrtha Mantis., MD;  Location: [place];  Service: Ophthalmology;  Laterality: Left;   Family History  Problem Relation Age of Onset  . Hypertension Mother   . Heart attack Mother   . Heart disease Mother    Social History:  reports that she has never smoked. She has never used smokeless tobacco. She reports that she does not drink alcohol or use drugs. Allergies  Allergen Reactions  . Hydrocodone-Acetaminophen Nausea And Vomiting and Other (See Comments)    Dizziness (also)   Prior to Admission medications   Medication Sig Start Date End Date Taking? Authorizing Provider  acetaminophen (TYLENOL) 500 MG tablet Take 1,000 mg by mouth every 8 (eight) hours as needed for mild pain, moderate pain, fever or headache.    Yes [provider]  amLODipine (NORVASC) 10 MG tablet Take 10 mg by mouth daily. 01/23/17  Yes [provider]  aspirin EC 81 MG EC tablet Take 1 tablet (81 mg total) by mouth daily. 12/10/16  Yes Hoffman,  Elza Rafter, DO  calcitRIOL (ROCALTROL) 0.25 MCG capsule Take 1 capsule (0.25 mcg total) by mouth daily. 01/15/17  Yes Sid Falcon, MD  calcium acetate (PHOSLO) 667 MG capsule Take 2 capsules (1,334 mg total) by mouth 3 (three) times daily with meals. 02/22/17  Yes Lacroce, Hulen Shouts, MD  carvedilol (COREG) 12.5 MG tablet Take 12.5 mg by mouth 2 (two) times daily. 02/17/17  Yes [provider]  sevelamer carbonate (RENVELA) 800 MG tablet Take 800 mg by mouth 3 (three) times daily. 02/10/17  Yes [provider]  cloNIDine (CATAPRES - DOSED IN MG/24 HR) 0.2 mg/24hr patch Place 1 patch (0.2 mg total) onto the skin every 7 (seven) days. 01/15/17 01/15/18  Sid Falcon, MD  nitroGLYCERIN (NITROSTAT) 0.3 MG SL tablet Place 1 tablet (0.3 mg total) under the tongue every 5 (five) minutes as needed for chest pain. Patient not taking: Reported on 04/08/2017 02/27/17   Sid Falcon, MD  SENSIPAR 60 MG tablet Take 1 tablet (60 mg total) by mouth daily. Patient not taking: Reported on 04/08/2017 01/15/17   Sid Falcon, MD  traMADol (ULTRAM) 50 MG tablet Take 1 tablet (50 mg total) by mouth every 6 (six) hours as needed for severe pain. 04/24/17   Rhyne, Hulen Shouts, PA-C   Current Facility-Administered Medications  Medication Dose Route Frequency Provider Last Rate Last Dose  . 0.9 %  sodium chloride infusion  250 mL Intravenous PRN Laurence Slate M, PA-C      . 0.9 %  sodium chloride infusion  100 mL Intravenous PRN Penninger, Ria Comment, PA      . 0.9 %  sodium chloride infusion  100 mL Intravenous PRN Penninger, Ria Comment, PA      . acetaminophen (TYLENOL) tablet 1,000 mg  1,000 mg Oral Q8H PRN Laurence Slate M, PA-C   1,000 mg at 04/23/17 2157  . alteplase (CATHFLO ACTIVASE) injection 2 mg  2 mg Intracatheter Once PRN Penninger, Ria Comment, PA      . alum & mag hydroxide-simeth (MAALOX/MYLANTA) 200-200-20 MG/5ML suspension 15-30 mL  15-30 mL Oral Q2H PRN Laurence Slate M, PA-C      . amLODipine (NORVASC) tablet 10 mg  10 mg Oral Daily Laurence Slate M, PA-C   10 mg at 04/23/17 1829  . aspirin EC tablet 81 mg  81 mg Oral Daily Laurence Slate M, [place]      . calcitRIOL (ROCALTROL) capsule 0.25 mcg  0.25 mcg Oral Daily Laurence Slate M, PA-C   0.25 mcg at 04/23/17 1829  . calcium acetate (PHOSLO) capsule 1,334 mg  1,334 mg Oral TID WC Ulyses Amor, PA-C   1,334 mg at 04/23/17 1829  . carvedilol (COREG) tablet 12.5 mg  12.5 mg Oral BID Laurence Slate M, PA-C   12.5 mg at 04/23/17 2157  . cinacalcet (SENSIPAR)  tablet 60 mg  60 mg Oral Q breakfast Laurence Slate M, [place]      . [START ON 04/27/2017] cloNIDine (CATAPRES - Dosed in mg/24 hr) patch 0.2 mg  0.2 mg Transdermal Q Sun Collins, Emma M, PA-C      . enoxaparin (LOVENOX) injection 30 mg  30 mg Subcutaneous Q24H Collins, Emma M, PA-C      . guaiFENesin-dextromethorphan (ROBITUSSIN DM) 100-10 MG/5ML syrup 15 mL  15 mL Oral Q4H PRN Laurence Slate M, PA-C      . heparin injection 1,200 Units  20 Units/kg Dialysis PRN Donato Heinz, MD      . hydrALAZINE (  APRESOLINE) injection 5 mg  5 mg Intravenous Q20 Min PRN Laurence Slate M, PA-C      . labetalol (NORMODYNE,TRANDATE) injection 10 mg  10 mg Intravenous Q10 min PRN Laurence Slate M, PA-C      . metoprolol tartrate (LOPRESSOR) injection 2-5 mg  2-5 mg Intravenous Q2H PRN Laurence Slate M, PA-C      . nitroGLYCERIN (NITROSTAT) SL tablet 0.4 mg  0.4 mg Sublingual Q5 min PRN Laurence Slate M, PA-C      . ondansetron [facility]) injection 4 mg  4 mg Intravenous Q6H PRN Laurence Slate M, PA-C      . pantoprazole (PROTONIX) EC tablet 40 mg  40 mg Oral Daily Collins, Emma M, PA-C      . phenol (CHLORASEPTIC) mouth spray 1 spray  1 spray Mouth/Throat PRN Laurence Slate M, PA-C      . potassium chloride SA (K-DUR,KLOR-CON) CR tablet 20-40 mEq  20-40 mEq Oral Once Laurence Slate M, PA-C      . sevelamer carbonate (RENVELA) tablet 800 mg  800 mg Oral TID WC Laurence Slate M, PA-C   800 mg at 04/23/17 1829  . sodium chloride flush (NS) 0.9 % injection 3 mL  3 mL Intravenous Q12H Collins, Emma M, PA-C      . sodium chloride flush (NS) 0.9 % injection 3 mL  3 mL Intravenous PRN Laurence Slate M, PA-C      . traMADol Veatrice Bourbon) tablet 50 mg  50 mg Oral Q6H PRN Ulyses Amor, PA-C   50 mg at 04/24/17 6387   Labs: Basic Metabolic Panel:  Recent Labs Lab 04/23/17 1011 04/23/17 1907 04/23/17 2242 04/24/17 0205  NA 140  --  138 138  K 4.5  --  4.8 4.9  CL 102  --  100* 102  CO2 21*  --  23 20*  GLUCOSE 75  --  162* 113*   BUN 52*  --  59* 58*  CREATININE 8.30* 8.57* 8.79* 9.07*  CALCIUM 8.4*  --  8.2* 8.1*  PHOS  --   --  9.6* 9.7*   Liver Function Tests:  Recent Labs Lab 04/23/17 2242 04/24/17 0205  ALBUMIN 2.6* 2.7*   No results for input(s): LIPASE, AMYLASE in the last 168 hours. No results for input(s): AMMONIA in the last 168 hours. CBC:  Recent Labs Lab 04/23/17 1907 04/23/17 2242 04/24/17 0205  WBC 5.1 4.5 5.1  HGB 10.8* 10.4* 10.7*  HCT 34.9* 33.6* 34.6*  MCV 100.6* 99.4 99.1  PLT 130* 136* 128*   Cardiac Enzymes: No results for input(s): CKTOTAL, CKMB, CKMBINDEX, TROPONINI in the last 168 hours. CBG: No results for input(s): GLUCAP in the last 168 hours. Iron Studies: No results for input(s): IRON, TIBC, TRANSFERRIN, FERRITIN in the last 72 hours. Studies/Results: No results found.  ROS: All others negative except those listed in HPI.  Physical Exam: Vitals:   04/24/17 0700 04/24/17 0709 04/24/17 0730 04/24/17 0830  BP: 115/75 120/61 (!) 97/53 117/74  Pulse: 80 80 80 78  Resp:  16    Temp:  98 F (36.7 C)    TempSrc:  Oral    SpO2:  98%    Weight:  57.9 kg (127 lb 10.3 oz)    Height:         General: WDWN, NAD, small elderly BF Head: NCAT sclera not icteric MMM Neck: Supple. No lymphadenopathy Lungs: CTA bilaterally. No wheeze, rales or rhonchi. Breathing is unlabored. Heart: RRR. +5/6 systolic murmur,  no rubs or gallops.  Abdomen: soft, nontender, +BS, no guarding, no rebound tenderness Lower extremities:no edema, ischemic changes, or open wounds  Neuro: A&Ox3. Moves all extremities spontaneously. Psych:  Responds to questions appropriately with a normal affect. Dialysis Access: TDC, LU AVF maturing +thrill  Dialysis Orders:  MWF - [place]  4hrs, BFR 400, DFR 800,  EDW 54kg, 3K/ 2.5Ca  Access: TDC  Heparin 3000 Unit bolus  Venofer 100mg  IV x10 - 6 completed Mircera 81mcg IV q2 wks - last 10/3  Last Labs: 10/10 Hgb 11.2, Ca 8.4, P 9.3 9/26: TSAT  27%, K 3.7, PTH 1799, Alb 3.4  Assessment/Plan: 1.  Fistula recreation - s/p 2nd stage basilic vein transposition - LUE, Dr. Donnetta Hutching 10/17 2.  ESRD -  MWF - Off schedule today due to surgery. Resume regular schedule tomorrow either in center or here if still admitted.  3.  Hypertension/volume  - BP controlled. Over EDW, titrate down volume as tolerated.  4.  Anemia  - Hgb 10.7, no ESA indicated. 5.  Secondary Hyperparathyroidism -  Ca in goal. P elevated. Continue binder, VDRA, and Sensipar. 6.  Nutrition - Alb 2.7. Prostat, renavite and renal/carb modified diet.  7. CHF - per primary  Jen Mow, PA-C [facility] Pager: [phone] 04/24/2017, 8:41 AM   Pt seen, examined and agree w A/P as above.  Kelly Splinter MD [employer] pager [phone]   04/24/2017, 4:09 PM

## 2017-04-24 NOTE — Care Management Obs Status (Signed)
[place] NOTIFICATION   Patient Details  Name: Nancy [name] MRN: [id number] Date of Birth: 1940-10-18   Medicare Observation Status Notification Given:  Yes    Dawayne Patricia, RN 04/24/2017, 12:21 PM

## 2017-04-24 NOTE — Care Management CC44 (Signed)
Condition Code 44 Documentation Completed  Patient Details  Name: Nancy [name] MRN: [id number] Date of Birth: December 30, 1940   Condition Code 44 given:  Yes Patient signature on Condition Code 44 notice:  Yes Documentation of 2 MD's agreement:  Yes Code 44 added to claim:  Yes    Dawayne Patricia, RN 04/24/2017, 12:21 PM

## 2017-04-24 NOTE — Progress Notes (Signed)
Pt discharged the unit via wheelchair and was accompanied by a Therapist, sports. Pt left with all of her belongings.   Grant Fontana BSN, RN

## 2017-04-24 NOTE — Progress Notes (Signed)
Pt to be discharged home with family. Pt received discharge instructions and all questions were answered. IV and telemetry box removed. Pt waiting for daughter to arrive. This RN contacted pt's daughter, [name], and she is on the way now. Pt has all belongings packed.    Grant Fontana BSN, RN

## 2017-04-24 NOTE — Progress Notes (Signed)
Patient arrived from dialysis. VS are stable, B/P 106-47, RR15, Heart Rate 87 and o2 97% RA. Patient A&O and resting comfortably in bed. Linton Flemings called to give report from dialysis and stated that patient was in dialysis for 4 hours and 2.5 L of fluid removed. She stated that patient tolerated well.

## 2017-04-24 NOTE — Discharge Instructions (Signed)
° °  [facility]  Discharge Instructions  AV Fistula or Graft Surgery for Dialysis Access  Please refer to the following instructions for your post-procedure care. Your surgeon or physician assistant will discuss any changes with you.  Activity  You may drive the day following your surgery, if you are comfortable and no longer taking prescription pain medication. Resume full activity as the soreness in your incision resolves.  Bathing/Showering  You may shower after you go home. Keep your incision dry for 48 hours. Do not soak in a bathtub, hot tub, or swim until the incision heals completely. You may not shower if you have a hemodialysis catheter.  Incision Care  Clean your incision with mild soap and water after 48 hours. Pat the area dry with a clean towel. You do not need a bandage unless otherwise instructed. Do not apply any ointments or creams to your incision. You may have skin glue on your incision. Do not peel it off. It will come off on its own in about one week. Your arm may swell a bit after surgery. To reduce swelling use pillows to elevate your arm so it is above your heart. Your doctor will tell you if you need to lightly wrap your arm with an ACE bandage.  Diet  Resume your normal diet. There are not special food restrictions following this procedure. In order to heal from your surgery, it is CRITICAL to get adequate nutrition. Your body requires vitamins, minerals, and protein. Vegetables are the best source of vitamins and minerals. Vegetables also provide the perfect balance of protein. Processed food has little nutritional value, so try to avoid this.  Medications  Resume taking all of your medications. If your incision is causing pain, you may take over-the counter pain relievers such as acetaminophen (Tylenol). If you were prescribed a stronger pain medication, please be aware these medications can cause nausea and constipation. Prevent  nausea by taking the medication with a snack or meal. Avoid constipation by drinking plenty of fluids and eating foods with high amount of fiber, such as fruits, vegetables, and grains. Do not take Tylenol if you are taking prescription pain medications.  Follow up Your surgeon may want to see you in the office following your access surgery. If so, this will be arranged at the time of your surgery.  Please call us immediately for any of the following conditions:  Increased pain, redness, drainage (pus) from your incision site Fever of 101 degrees or higher Severe or worsening pain at your incision site Hand pain or numbness.  Reduce your risk of vascular disease:  Stop smoking. If you would like help, call [facility] at 1-800-QUIT-NOW 250 285 1825) or [facility] [place] your cholesterol Maintain a desired weight Control your diabetes Keep your blood pressure down  Dialysis  It will take several weeks to several months for your new dialysis access to be ready for use. Your surgeon will determine when it is OK to use it. Your nephrologist will continue to direct your dialysis. You can continue to use your Permcath until your new access is ready for use.   04/24/2017 [place] [facility] [id number] [birth year]  Surgeon(s): Early, Arvilla Meres, MD  Procedure(s): 2nd Stage left basilic vein transposition  x Do not stick fistula for 8 weeks    If you have any questions, please call the office at [phone].

## 2017-04-25 DIAGNOSIS — D631 Anemia in chronic kidney disease: Secondary | ICD-10-CM | POA: Diagnosis not present

## 2017-04-25 DIAGNOSIS — R51 Headache: Secondary | ICD-10-CM | POA: Diagnosis not present

## 2017-04-25 DIAGNOSIS — N2581 Secondary hyperparathyroidism of renal origin: Secondary | ICD-10-CM | POA: Diagnosis not present

## 2017-04-25 DIAGNOSIS — Z23 Encounter for immunization: Secondary | ICD-10-CM | POA: Diagnosis not present

## 2017-04-25 DIAGNOSIS — N186 End stage renal disease: Secondary | ICD-10-CM | POA: Diagnosis not present

## 2017-04-25 DIAGNOSIS — D509 Iron deficiency anemia, unspecified: Secondary | ICD-10-CM | POA: Diagnosis not present

## 2017-04-25 DIAGNOSIS — D689 Coagulation defect, unspecified: Secondary | ICD-10-CM | POA: Diagnosis not present

## 2017-04-25 NOTE — Discharge Summary (Signed)
[facility] Discharge Summary   Patient ID:  Nancy [name] MRN: [id number] DOB/AGE: [birth year] 76 y.o.  Admit date: 04/23/2017 Discharge date: 04/24/2017 Date of Surgery: 04/23/2017 Surgeon: Surgeon(s): Early, Arvilla Meres, MD  Admission Diagnosis: end stage renal disease  Discharge Diagnoses:  end stage renal disease  Secondary Diagnoses: Past Medical History:  Diagnosis Date  . Anemia   . Aortic stenosis   . Bacterial sinusitis 09/17/2011  . CHF (congestive heart failure) ([place])   . CKD (chronic kidney disease) stage 4, GFR 15-29 ml/min ([place]) 08/11/2006   Cr continues to increase. Proteinuria on UA 02/10/12.    . Colitis   . CVA (cerebrovascular accident) [facility])    New hemorrhagic per CT scan '09  . Diverticulosis of colon   . Dysfunctional uterine bleeding   . Fecal impaction ([place])   . Headache(784.0)   . Heart murmur   . HERNIORRHAPHY, HX OF 08/11/2006  . Hypertension   . OA (osteoarthritis)    bilateral knees  . Postmenopausal   . Pulmonary nodule   . TINEA CRURIS 01/12/2007    Procedure(s): BASILIC VEIN TRANSPOSITION SECOND STAGE  Discharged Condition: good  HPI: Nancy [name] is a 76 y.o. female here today with her daughter for discussion of second stage basilic vein transposition fistula. She is well-known to me from placement of a right IJ hemodialysis catheter and first stage brachial basilic fistula creation on 02/19/2017. She has done well since her discharge from the hospital. They report no difficulty with her hemodialysis catheter. They do report she does is having difficulty with hypotension around the time of her hemodialysis. Denies any hand symptoms specifically related to steal. She's had complete resolution of soreness at her antecubital space from her incision   Duplex today shows size maturation is quite nicely and her basilic vein fistula. I imaged the fistula myself with SonoSite ultrasound and confirmed a very nice size maturation  throughout its course.  Hospital Course:  Nancy B Nancy [name] is a 76 y.o. female is S/P  Procedure(s): BASILIC VEIN TRANSPOSITION SECOND STAGE  Uneventful stay over night.  Dr. Jonnie Finner consulted.  Patient received HD and was discharged in stable condition. Palpable thrill throughout the fistula, incisions healing well, palpable radial pulse. Consults:  Treatment Team:  Roney Jaffe, MD  Significant Diagnostic Studies: CBC Lab Results  Component Value Date   WBC 5.1 04/24/2017   HGB 10.7 (L) 04/24/2017   HCT 34.6 (L) 04/24/2017   MCV 99.1 04/24/2017   PLT 128 (L) 04/24/2017    BMET    Component Value Date/Time   NA 138 04/24/2017 0205   NA 143 01/15/2017 1055   K 4.9 04/24/2017 0205   CL 102 04/24/2017 0205   CO2 20 (L) 04/24/2017 0205   GLUCOSE 113 (H) 04/24/2017 0205   BUN 58 (H) 04/24/2017 0205   BUN 109 (HH) 01/15/2017 1055   CREATININE 9.07 (H) 04/24/2017 0205   CREATININE 5.79 (H) 11/25/2014 1011   CALCIUM 8.1 (L) 04/24/2017 0205   GFRNONAA 4 (L) 04/24/2017 0205   GFRNONAA 9 (L) 02/09/2014 0909   GFRAA 4 (L) 04/24/2017 0205   GFRAA 10 (L) 02/09/2014 0909   COAG Lab Results  Component Value Date   INR 1.30 02/17/2017   INR 1.14 09/21/2016   INR 1.06 07/13/2012     Disposition:  Discharge to :Home Discharge Instructions    Discharge patient    Complete by:  As directed    Discharge disposition:  01-Home or  Self Care   Discharge patient date:  04/24/2017     Allergies as of 04/24/2017      Reactions   Hydrocodone-acetaminophen Nausea And Vomiting, Other (See Comments)   Dizziness (also)      Medication List    TAKE these medications   acetaminophen 500 MG tablet Commonly known as:  TYLENOL Take 1,000 mg by mouth every 8 (eight) hours as needed for mild pain, moderate pain, fever or headache.   amLODipine 10 MG tablet Commonly known as:  NORVASC Take 10 mg by mouth daily.   aspirin 81 MG EC tablet Take 1 tablet (81 mg total) by mouth  daily.   calcitRIOL 0.25 MCG capsule Commonly known as:  ROCALTROL Take 1 capsule (0.25 mcg total) by mouth daily.   calcium acetate 667 MG capsule Commonly known as:  PHOSLO Take 2 capsules (1,334 mg total) by mouth 3 (three) times daily with meals.   carvedilol 12.5 MG tablet Commonly known as:  COREG Take 12.5 mg by mouth 2 (two) times daily.   cloNIDine 0.2 mg/24hr patch Commonly known as:  CATAPRES - Dosed in mg/24 hr Place 1 patch (0.2 mg total) onto the skin every 7 (seven) days.   nitroGLYCERIN 0.3 MG SL tablet Commonly known as:  NITROSTAT Place 1 tablet (0.3 mg total) under the tongue every 5 (five) minutes as needed for chest pain.   SENSIPAR 60 MG tablet Generic drug:  cinacalcet Take 1 tablet (60 mg total) by mouth daily.   sevelamer carbonate 800 MG tablet Commonly known as:  RENVELA Take 800 mg by mouth 3 (three) times daily.   traMADol 50 MG tablet Commonly known as:  ULTRAM Take 1 tablet (50 mg total) by mouth every 6 (six) hours as needed for severe pain. What changed:  when to take this   traMADol 50 MG tablet Commonly known as:  ULTRAM Take 1 tablet (50 mg total) by mouth every 6 (six) hours as needed for severe pain. What changed:  You were already taking a medication with the same name, and this prescription was added. Make sure you understand how and when to take each.      Verbal and written Discharge instructions given to the patient. Wound care per Discharge AVS Follow-up Information    Early, Arvilla Meres, MD Follow up in 6 week(s).   Specialties:  Vascular Surgery, Cardiology Why:  Office will call you to arrange your appt (sent) Contact information: [place] [address] [phone]           Signed: Laurence Slate [facility] 04/25/2017, 10:33 AM

## 2017-04-28 DIAGNOSIS — D509 Iron deficiency anemia, unspecified: Secondary | ICD-10-CM | POA: Diagnosis not present

## 2017-04-28 DIAGNOSIS — N2581 Secondary hyperparathyroidism of renal origin: Secondary | ICD-10-CM | POA: Diagnosis not present

## 2017-04-28 DIAGNOSIS — D631 Anemia in chronic kidney disease: Secondary | ICD-10-CM | POA: Diagnosis not present

## 2017-04-28 DIAGNOSIS — R51 Headache: Secondary | ICD-10-CM | POA: Diagnosis not present

## 2017-04-28 DIAGNOSIS — D689 Coagulation defect, unspecified: Secondary | ICD-10-CM | POA: Diagnosis not present

## 2017-04-28 DIAGNOSIS — N186 End stage renal disease: Secondary | ICD-10-CM | POA: Diagnosis not present

## 2017-04-28 DIAGNOSIS — Z23 Encounter for immunization: Secondary | ICD-10-CM | POA: Diagnosis not present

## 2017-04-30 DIAGNOSIS — D689 Coagulation defect, unspecified: Secondary | ICD-10-CM | POA: Diagnosis not present

## 2017-04-30 DIAGNOSIS — R51 Headache: Secondary | ICD-10-CM | POA: Diagnosis not present

## 2017-04-30 DIAGNOSIS — D631 Anemia in chronic kidney disease: Secondary | ICD-10-CM | POA: Diagnosis not present

## 2017-04-30 DIAGNOSIS — Z23 Encounter for immunization: Secondary | ICD-10-CM | POA: Diagnosis not present

## 2017-04-30 DIAGNOSIS — N186 End stage renal disease: Secondary | ICD-10-CM | POA: Diagnosis not present

## 2017-04-30 DIAGNOSIS — N2581 Secondary hyperparathyroidism of renal origin: Secondary | ICD-10-CM | POA: Diagnosis not present

## 2017-04-30 DIAGNOSIS — D509 Iron deficiency anemia, unspecified: Secondary | ICD-10-CM | POA: Diagnosis not present

## 2017-05-01 ENCOUNTER — Ambulatory Visit: Payer: Medicare Other | Admitting: Podiatry

## 2017-05-02 ENCOUNTER — Ambulatory Visit: Payer: Medicare Other | Admitting: Podiatry

## 2017-05-02 DIAGNOSIS — D631 Anemia in chronic kidney disease: Secondary | ICD-10-CM | POA: Diagnosis not present

## 2017-05-02 DIAGNOSIS — D689 Coagulation defect, unspecified: Secondary | ICD-10-CM | POA: Diagnosis not present

## 2017-05-02 DIAGNOSIS — R51 Headache: Secondary | ICD-10-CM | POA: Diagnosis not present

## 2017-05-02 DIAGNOSIS — Z23 Encounter for immunization: Secondary | ICD-10-CM | POA: Diagnosis not present

## 2017-05-02 DIAGNOSIS — N2581 Secondary hyperparathyroidism of renal origin: Secondary | ICD-10-CM | POA: Diagnosis not present

## 2017-05-02 DIAGNOSIS — D509 Iron deficiency anemia, unspecified: Secondary | ICD-10-CM | POA: Diagnosis not present

## 2017-05-02 DIAGNOSIS — N186 End stage renal disease: Secondary | ICD-10-CM | POA: Diagnosis not present

## 2017-05-05 ENCOUNTER — Telehealth: Payer: Self-pay | Admitting: *Deleted

## 2017-05-05 NOTE — Telephone Encounter (Signed)
Follow up phone call and spoke with patient's grand daughter who ask I call patient re swollen arm x1 day. and need to get to dialysis today. Called patient who states that hand is swollen, fingers tight and painful but states arm is warm, pink fingers and dry, felling equal to both arms.  Denies any fever or signs of infection. Told her is is best to get to dialysis today so they can look at it. Patient states she sent her ride away and not going to dialysis. Instructed her to keep arm elevated above level of heart, wiggle finger to get swelling down. Call us back if no improvement.Called grand daughter and gave her update on what I told the patient.

## 2017-05-07 DIAGNOSIS — I150 Renovascular hypertension: Secondary | ICD-10-CM | POA: Diagnosis not present

## 2017-05-07 DIAGNOSIS — D509 Iron deficiency anemia, unspecified: Secondary | ICD-10-CM | POA: Diagnosis not present

## 2017-05-07 DIAGNOSIS — D631 Anemia in chronic kidney disease: Secondary | ICD-10-CM | POA: Diagnosis not present

## 2017-05-07 DIAGNOSIS — Z23 Encounter for immunization: Secondary | ICD-10-CM | POA: Diagnosis not present

## 2017-05-07 DIAGNOSIS — N2581 Secondary hyperparathyroidism of renal origin: Secondary | ICD-10-CM | POA: Diagnosis not present

## 2017-05-07 DIAGNOSIS — R51 Headache: Secondary | ICD-10-CM | POA: Diagnosis not present

## 2017-05-07 DIAGNOSIS — D689 Coagulation defect, unspecified: Secondary | ICD-10-CM | POA: Diagnosis not present

## 2017-05-07 DIAGNOSIS — Z992 Dependence on renal dialysis: Secondary | ICD-10-CM | POA: Diagnosis not present

## 2017-05-07 DIAGNOSIS — N186 End stage renal disease: Secondary | ICD-10-CM | POA: Diagnosis not present

## 2017-05-08 ENCOUNTER — Telehealth: Payer: Self-pay | Admitting: *Deleted

## 2017-05-08 NOTE — Telephone Encounter (Signed)
Call from [place] daughter who states swelling and pain has resolved in Left arm s/p/ BVT, and patient has been to dialysis with out any problems. Call today because of swelling in right hand. Encouraged her to follow up with Nephrologist or PCP. Go to ER for any acute distress.

## 2017-05-09 DIAGNOSIS — D631 Anemia in chronic kidney disease: Secondary | ICD-10-CM | POA: Diagnosis not present

## 2017-05-09 DIAGNOSIS — R51 Headache: Secondary | ICD-10-CM | POA: Diagnosis not present

## 2017-05-09 DIAGNOSIS — Z23 Encounter for immunization: Secondary | ICD-10-CM | POA: Diagnosis not present

## 2017-05-09 DIAGNOSIS — D689 Coagulation defect, unspecified: Secondary | ICD-10-CM | POA: Diagnosis not present

## 2017-05-09 DIAGNOSIS — R079 Chest pain, unspecified: Secondary | ICD-10-CM | POA: Diagnosis not present

## 2017-05-09 DIAGNOSIS — N186 End stage renal disease: Secondary | ICD-10-CM | POA: Diagnosis not present

## 2017-05-09 DIAGNOSIS — N2581 Secondary hyperparathyroidism of renal origin: Secondary | ICD-10-CM | POA: Diagnosis not present

## 2017-05-09 DIAGNOSIS — D509 Iron deficiency anemia, unspecified: Secondary | ICD-10-CM | POA: Diagnosis not present

## 2017-05-12 DIAGNOSIS — D689 Coagulation defect, unspecified: Secondary | ICD-10-CM | POA: Diagnosis not present

## 2017-05-12 DIAGNOSIS — D631 Anemia in chronic kidney disease: Secondary | ICD-10-CM | POA: Diagnosis not present

## 2017-05-12 DIAGNOSIS — N2581 Secondary hyperparathyroidism of renal origin: Secondary | ICD-10-CM | POA: Diagnosis not present

## 2017-05-12 DIAGNOSIS — N186 End stage renal disease: Secondary | ICD-10-CM | POA: Diagnosis not present

## 2017-05-12 DIAGNOSIS — D509 Iron deficiency anemia, unspecified: Secondary | ICD-10-CM | POA: Diagnosis not present

## 2017-05-12 DIAGNOSIS — R079 Chest pain, unspecified: Secondary | ICD-10-CM | POA: Diagnosis not present

## 2017-05-12 DIAGNOSIS — Z23 Encounter for immunization: Secondary | ICD-10-CM | POA: Diagnosis not present

## 2017-05-12 DIAGNOSIS — R51 Headache: Secondary | ICD-10-CM | POA: Diagnosis not present

## 2017-05-14 DIAGNOSIS — N186 End stage renal disease: Secondary | ICD-10-CM | POA: Diagnosis not present

## 2017-05-14 DIAGNOSIS — D631 Anemia in chronic kidney disease: Secondary | ICD-10-CM | POA: Diagnosis not present

## 2017-05-14 DIAGNOSIS — R079 Chest pain, unspecified: Secondary | ICD-10-CM | POA: Diagnosis not present

## 2017-05-14 DIAGNOSIS — R51 Headache: Secondary | ICD-10-CM | POA: Diagnosis not present

## 2017-05-14 DIAGNOSIS — N2581 Secondary hyperparathyroidism of renal origin: Secondary | ICD-10-CM | POA: Diagnosis not present

## 2017-05-14 DIAGNOSIS — D689 Coagulation defect, unspecified: Secondary | ICD-10-CM | POA: Diagnosis not present

## 2017-05-14 DIAGNOSIS — D509 Iron deficiency anemia, unspecified: Secondary | ICD-10-CM | POA: Diagnosis not present

## 2017-05-14 DIAGNOSIS — Z23 Encounter for immunization: Secondary | ICD-10-CM | POA: Diagnosis not present

## 2017-05-16 DIAGNOSIS — R079 Chest pain, unspecified: Secondary | ICD-10-CM | POA: Diagnosis not present

## 2017-05-16 DIAGNOSIS — D689 Coagulation defect, unspecified: Secondary | ICD-10-CM | POA: Diagnosis not present

## 2017-05-16 DIAGNOSIS — N2581 Secondary hyperparathyroidism of renal origin: Secondary | ICD-10-CM | POA: Diagnosis not present

## 2017-05-16 DIAGNOSIS — N186 End stage renal disease: Secondary | ICD-10-CM | POA: Diagnosis not present

## 2017-05-16 DIAGNOSIS — D509 Iron deficiency anemia, unspecified: Secondary | ICD-10-CM | POA: Diagnosis not present

## 2017-05-16 DIAGNOSIS — Z23 Encounter for immunization: Secondary | ICD-10-CM | POA: Diagnosis not present

## 2017-05-16 DIAGNOSIS — D631 Anemia in chronic kidney disease: Secondary | ICD-10-CM | POA: Diagnosis not present

## 2017-05-16 DIAGNOSIS — R51 Headache: Secondary | ICD-10-CM | POA: Diagnosis not present

## 2017-05-19 DIAGNOSIS — D631 Anemia in chronic kidney disease: Secondary | ICD-10-CM | POA: Diagnosis not present

## 2017-05-19 DIAGNOSIS — D689 Coagulation defect, unspecified: Secondary | ICD-10-CM | POA: Diagnosis not present

## 2017-05-19 DIAGNOSIS — R51 Headache: Secondary | ICD-10-CM | POA: Diagnosis not present

## 2017-05-19 DIAGNOSIS — N186 End stage renal disease: Secondary | ICD-10-CM | POA: Diagnosis not present

## 2017-05-19 DIAGNOSIS — R079 Chest pain, unspecified: Secondary | ICD-10-CM | POA: Diagnosis not present

## 2017-05-19 DIAGNOSIS — Z23 Encounter for immunization: Secondary | ICD-10-CM | POA: Diagnosis not present

## 2017-05-19 DIAGNOSIS — N2581 Secondary hyperparathyroidism of renal origin: Secondary | ICD-10-CM | POA: Diagnosis not present

## 2017-05-19 DIAGNOSIS — D509 Iron deficiency anemia, unspecified: Secondary | ICD-10-CM | POA: Diagnosis not present

## 2017-05-21 DIAGNOSIS — R51 Headache: Secondary | ICD-10-CM | POA: Diagnosis not present

## 2017-05-21 DIAGNOSIS — R079 Chest pain, unspecified: Secondary | ICD-10-CM | POA: Diagnosis not present

## 2017-05-21 DIAGNOSIS — N186 End stage renal disease: Secondary | ICD-10-CM | POA: Diagnosis not present

## 2017-05-21 DIAGNOSIS — D631 Anemia in chronic kidney disease: Secondary | ICD-10-CM | POA: Diagnosis not present

## 2017-05-21 DIAGNOSIS — D509 Iron deficiency anemia, unspecified: Secondary | ICD-10-CM | POA: Diagnosis not present

## 2017-05-21 DIAGNOSIS — N2581 Secondary hyperparathyroidism of renal origin: Secondary | ICD-10-CM | POA: Diagnosis not present

## 2017-05-21 DIAGNOSIS — D689 Coagulation defect, unspecified: Secondary | ICD-10-CM | POA: Diagnosis not present

## 2017-05-21 DIAGNOSIS — Z23 Encounter for immunization: Secondary | ICD-10-CM | POA: Diagnosis not present

## 2017-05-23 DIAGNOSIS — D689 Coagulation defect, unspecified: Secondary | ICD-10-CM | POA: Diagnosis not present

## 2017-05-23 DIAGNOSIS — R51 Headache: Secondary | ICD-10-CM | POA: Diagnosis not present

## 2017-05-23 DIAGNOSIS — R079 Chest pain, unspecified: Secondary | ICD-10-CM | POA: Diagnosis not present

## 2017-05-23 DIAGNOSIS — D509 Iron deficiency anemia, unspecified: Secondary | ICD-10-CM | POA: Diagnosis not present

## 2017-05-23 DIAGNOSIS — D631 Anemia in chronic kidney disease: Secondary | ICD-10-CM | POA: Diagnosis not present

## 2017-05-23 DIAGNOSIS — Z23 Encounter for immunization: Secondary | ICD-10-CM | POA: Diagnosis not present

## 2017-05-23 DIAGNOSIS — N2581 Secondary hyperparathyroidism of renal origin: Secondary | ICD-10-CM | POA: Diagnosis not present

## 2017-05-23 DIAGNOSIS — N186 End stage renal disease: Secondary | ICD-10-CM | POA: Diagnosis not present

## 2017-05-26 DIAGNOSIS — D689 Coagulation defect, unspecified: Secondary | ICD-10-CM | POA: Diagnosis not present

## 2017-05-26 DIAGNOSIS — R079 Chest pain, unspecified: Secondary | ICD-10-CM | POA: Diagnosis not present

## 2017-05-26 DIAGNOSIS — Z23 Encounter for immunization: Secondary | ICD-10-CM | POA: Diagnosis not present

## 2017-05-26 DIAGNOSIS — D509 Iron deficiency anemia, unspecified: Secondary | ICD-10-CM | POA: Diagnosis not present

## 2017-05-26 DIAGNOSIS — N2581 Secondary hyperparathyroidism of renal origin: Secondary | ICD-10-CM | POA: Diagnosis not present

## 2017-05-26 DIAGNOSIS — N186 End stage renal disease: Secondary | ICD-10-CM | POA: Diagnosis not present

## 2017-05-26 DIAGNOSIS — R51 Headache: Secondary | ICD-10-CM | POA: Diagnosis not present

## 2017-05-26 DIAGNOSIS — D631 Anemia in chronic kidney disease: Secondary | ICD-10-CM | POA: Diagnosis not present

## 2017-05-27 DIAGNOSIS — D631 Anemia in chronic kidney disease: Secondary | ICD-10-CM | POA: Diagnosis not present

## 2017-05-27 DIAGNOSIS — R51 Headache: Secondary | ICD-10-CM | POA: Diagnosis not present

## 2017-05-27 DIAGNOSIS — Z23 Encounter for immunization: Secondary | ICD-10-CM | POA: Diagnosis not present

## 2017-05-27 DIAGNOSIS — D509 Iron deficiency anemia, unspecified: Secondary | ICD-10-CM | POA: Diagnosis not present

## 2017-05-27 DIAGNOSIS — D689 Coagulation defect, unspecified: Secondary | ICD-10-CM | POA: Diagnosis not present

## 2017-05-27 DIAGNOSIS — R079 Chest pain, unspecified: Secondary | ICD-10-CM | POA: Diagnosis not present

## 2017-05-27 DIAGNOSIS — N2581 Secondary hyperparathyroidism of renal origin: Secondary | ICD-10-CM | POA: Diagnosis not present

## 2017-05-27 DIAGNOSIS — N186 End stage renal disease: Secondary | ICD-10-CM | POA: Diagnosis not present

## 2017-05-28 DIAGNOSIS — Z23 Encounter for immunization: Secondary | ICD-10-CM | POA: Diagnosis not present

## 2017-05-28 DIAGNOSIS — N186 End stage renal disease: Secondary | ICD-10-CM | POA: Diagnosis not present

## 2017-05-28 DIAGNOSIS — R079 Chest pain, unspecified: Secondary | ICD-10-CM | POA: Diagnosis not present

## 2017-05-28 DIAGNOSIS — R51 Headache: Secondary | ICD-10-CM | POA: Diagnosis not present

## 2017-05-28 DIAGNOSIS — D689 Coagulation defect, unspecified: Secondary | ICD-10-CM | POA: Diagnosis not present

## 2017-05-28 DIAGNOSIS — N2581 Secondary hyperparathyroidism of renal origin: Secondary | ICD-10-CM | POA: Diagnosis not present

## 2017-05-28 DIAGNOSIS — D509 Iron deficiency anemia, unspecified: Secondary | ICD-10-CM | POA: Diagnosis not present

## 2017-05-28 DIAGNOSIS — D631 Anemia in chronic kidney disease: Secondary | ICD-10-CM | POA: Diagnosis not present

## 2017-05-30 DIAGNOSIS — R079 Chest pain, unspecified: Secondary | ICD-10-CM | POA: Diagnosis not present

## 2017-05-30 DIAGNOSIS — N2581 Secondary hyperparathyroidism of renal origin: Secondary | ICD-10-CM | POA: Diagnosis not present

## 2017-05-30 DIAGNOSIS — D689 Coagulation defect, unspecified: Secondary | ICD-10-CM | POA: Diagnosis not present

## 2017-05-30 DIAGNOSIS — N186 End stage renal disease: Secondary | ICD-10-CM | POA: Diagnosis not present

## 2017-05-30 DIAGNOSIS — D631 Anemia in chronic kidney disease: Secondary | ICD-10-CM | POA: Diagnosis not present

## 2017-05-30 DIAGNOSIS — Z23 Encounter for immunization: Secondary | ICD-10-CM | POA: Diagnosis not present

## 2017-05-30 DIAGNOSIS — R51 Headache: Secondary | ICD-10-CM | POA: Diagnosis not present

## 2017-05-30 DIAGNOSIS — D509 Iron deficiency anemia, unspecified: Secondary | ICD-10-CM | POA: Diagnosis not present

## 2017-06-02 DIAGNOSIS — D631 Anemia in chronic kidney disease: Secondary | ICD-10-CM | POA: Diagnosis not present

## 2017-06-02 DIAGNOSIS — Z23 Encounter for immunization: Secondary | ICD-10-CM | POA: Diagnosis not present

## 2017-06-02 DIAGNOSIS — D509 Iron deficiency anemia, unspecified: Secondary | ICD-10-CM | POA: Diagnosis not present

## 2017-06-02 DIAGNOSIS — N186 End stage renal disease: Secondary | ICD-10-CM | POA: Diagnosis not present

## 2017-06-02 DIAGNOSIS — N2581 Secondary hyperparathyroidism of renal origin: Secondary | ICD-10-CM | POA: Diagnosis not present

## 2017-06-02 DIAGNOSIS — R079 Chest pain, unspecified: Secondary | ICD-10-CM | POA: Diagnosis not present

## 2017-06-02 DIAGNOSIS — R51 Headache: Secondary | ICD-10-CM | POA: Diagnosis not present

## 2017-06-02 DIAGNOSIS — D689 Coagulation defect, unspecified: Secondary | ICD-10-CM | POA: Diagnosis not present

## 2017-06-03 ENCOUNTER — Encounter: Payer: Self-pay | Admitting: Vascular Surgery

## 2017-06-03 ENCOUNTER — Ambulatory Visit (INDEPENDENT_AMBULATORY_CARE_PROVIDER_SITE_OTHER): Payer: Medicare Other | Admitting: Vascular Surgery

## 2017-06-03 VITALS — BP 103/57 | HR 84 | Temp 97.1°F | Resp 20 | Ht 62.0 in | Wt 126.0 lb

## 2017-06-03 DIAGNOSIS — N186 End stage renal disease: Secondary | ICD-10-CM

## 2017-06-03 DIAGNOSIS — Z992 Dependence on renal dialysis: Secondary | ICD-10-CM

## 2017-06-03 NOTE — Progress Notes (Signed)
  POST OPERATIVE OFFICE NOTE    CC:  F/u for surgery  HPI:  This is a 76 y.o. female who is s/p underwent 1st stage BVT with TDC placement by Dr. Donnetta Hutching on 02/19/17.  She underwent the 2nd stage BVT on 04/23/17.  She presents today for follow up.  She states that she has not had any pain in her hand.  She states she did have some swelling but soaked her hand in epsom salt soak and this helped with the swelling.    She dialyzes M/W/F at the center in [place] on [address].  She is unsure who her nephrologist is.  Allergies  Allergen Reactions  . Hydrocodone-Acetaminophen Nausea And Vomiting and Other (See Comments)    Dizziness (also)    Current Outpatient Medications  Medication Sig Dispense Refill  . acetaminophen (TYLENOL) 500 MG tablet Take 1,000 mg by mouth every 8 (eight) hours as needed for mild pain, moderate pain, fever or headache.     Marland Kitchen amLODipine (NORVASC) 10 MG tablet Take 10 mg by mouth daily.  5  . aspirin EC 81 MG EC tablet Take 1 tablet (81 mg total) by mouth daily. 30 tablet 1  . calcitRIOL (ROCALTROL) 0.25 MCG capsule Take 1 capsule (0.25 mcg total) by mouth daily. 90 capsule 0  . calcium acetate (PHOSLO) 667 MG capsule Take 2 capsules (1,334 mg total) by mouth 3 (three) times daily with meals. 90 capsule 1  . carvedilol (COREG) 12.5 MG tablet Take 12.5 mg by mouth 2 (two) times daily.    . cloNIDine (CATAPRES - DOSED IN MG/24 HR) 0.2 mg/24hr patch Place 1 patch (0.2 mg total) onto the skin every 7 (seven) days. 12 patch 2  . nitroGLYCERIN (NITROSTAT) 0.3 MG SL tablet Place 1 tablet (0.3 mg total) under the tongue every 5 (five) minutes as needed for chest pain. 30 tablet 0  . SENSIPAR 60 MG tablet Take 1 tablet (60 mg total) by mouth daily. 30 tablet 6  . sevelamer carbonate (RENVELA) 800 MG tablet Take 800 mg by mouth 3 (three) times daily.  5  . traMADol (ULTRAM) 50 MG tablet Take 1 tablet (50 mg total) by mouth every 6 (six) hours as needed for severe pain. 10  tablet 0  . traMADol (ULTRAM) 50 MG tablet Take 1 tablet (50 mg total) by mouth every 6 (six) hours as needed for severe pain. 6 tablet 0   No current facility-administered medications for this visit.      ROS:  See HPI  Physical Exam:  Vitals:   06/03/17 1458  BP: (!) 103/57  Pulse: 84  Resp: 20  Temp: (!) 97.1 F (36.2 C)  SpO2: 100%    Incisions:  Well healed Extremities:  +palpable left radial pulse; +thrill/bruit within the fistula; easily palpable   Assessment/Plan:  This is a 76 y.o. female who is s/p: 2nd stage BVT  -Dr. Donnetta Hutching feels we should give her fistula another month and then may start using at the end of December.  Once the fistula has been used 2-3 times successfully, we will schedule to have her TDC removed.  Otherwise, we will see her back as needed.    Leontine Locket, PA-C [facility] [phone]  Clinic MD:  Pt seen and examined with Dr. Donnetta Hutching

## 2017-06-04 DIAGNOSIS — Z23 Encounter for immunization: Secondary | ICD-10-CM | POA: Diagnosis not present

## 2017-06-04 DIAGNOSIS — D509 Iron deficiency anemia, unspecified: Secondary | ICD-10-CM | POA: Diagnosis not present

## 2017-06-04 DIAGNOSIS — N186 End stage renal disease: Secondary | ICD-10-CM | POA: Diagnosis not present

## 2017-06-04 DIAGNOSIS — R51 Headache: Secondary | ICD-10-CM | POA: Diagnosis not present

## 2017-06-04 DIAGNOSIS — N2581 Secondary hyperparathyroidism of renal origin: Secondary | ICD-10-CM | POA: Diagnosis not present

## 2017-06-04 DIAGNOSIS — D689 Coagulation defect, unspecified: Secondary | ICD-10-CM | POA: Diagnosis not present

## 2017-06-04 DIAGNOSIS — R079 Chest pain, unspecified: Secondary | ICD-10-CM | POA: Diagnosis not present

## 2017-06-04 DIAGNOSIS — D631 Anemia in chronic kidney disease: Secondary | ICD-10-CM | POA: Diagnosis not present

## 2017-06-06 DIAGNOSIS — N186 End stage renal disease: Secondary | ICD-10-CM | POA: Diagnosis not present

## 2017-06-06 DIAGNOSIS — R079 Chest pain, unspecified: Secondary | ICD-10-CM | POA: Diagnosis not present

## 2017-06-06 DIAGNOSIS — D631 Anemia in chronic kidney disease: Secondary | ICD-10-CM | POA: Diagnosis not present

## 2017-06-06 DIAGNOSIS — D509 Iron deficiency anemia, unspecified: Secondary | ICD-10-CM | POA: Diagnosis not present

## 2017-06-06 DIAGNOSIS — R51 Headache: Secondary | ICD-10-CM | POA: Diagnosis not present

## 2017-06-06 DIAGNOSIS — N2581 Secondary hyperparathyroidism of renal origin: Secondary | ICD-10-CM | POA: Diagnosis not present

## 2017-06-06 DIAGNOSIS — D689 Coagulation defect, unspecified: Secondary | ICD-10-CM | POA: Diagnosis not present

## 2017-06-06 DIAGNOSIS — Z992 Dependence on renal dialysis: Secondary | ICD-10-CM | POA: Diagnosis not present

## 2017-06-06 DIAGNOSIS — Z23 Encounter for immunization: Secondary | ICD-10-CM | POA: Diagnosis not present

## 2017-06-06 DIAGNOSIS — I150 Renovascular hypertension: Secondary | ICD-10-CM | POA: Diagnosis not present

## 2017-06-09 ENCOUNTER — Other Ambulatory Visit: Payer: Self-pay | Admitting: *Deleted

## 2017-06-10 MED ORDER — AMLODIPINE BESYLATE 10 MG PO TABS: 10.0000 mg | ORAL_TABLET | Freq: Every day | ORAL | 5 refills | Status: DC

## 2017-06-11 DIAGNOSIS — R51 Headache: Secondary | ICD-10-CM | POA: Diagnosis not present

## 2017-06-11 DIAGNOSIS — D631 Anemia in chronic kidney disease: Secondary | ICD-10-CM | POA: Diagnosis not present

## 2017-06-11 DIAGNOSIS — D689 Coagulation defect, unspecified: Secondary | ICD-10-CM | POA: Diagnosis not present

## 2017-06-11 DIAGNOSIS — N186 End stage renal disease: Secondary | ICD-10-CM | POA: Diagnosis not present

## 2017-06-11 DIAGNOSIS — N2581 Secondary hyperparathyroidism of renal origin: Secondary | ICD-10-CM | POA: Diagnosis not present

## 2017-06-11 DIAGNOSIS — D509 Iron deficiency anemia, unspecified: Secondary | ICD-10-CM | POA: Diagnosis not present

## 2017-06-13 DIAGNOSIS — N186 End stage renal disease: Secondary | ICD-10-CM | POA: Diagnosis not present

## 2017-06-13 DIAGNOSIS — D689 Coagulation defect, unspecified: Secondary | ICD-10-CM | POA: Diagnosis not present

## 2017-06-13 DIAGNOSIS — N2581 Secondary hyperparathyroidism of renal origin: Secondary | ICD-10-CM | POA: Diagnosis not present

## 2017-06-13 DIAGNOSIS — R51 Headache: Secondary | ICD-10-CM | POA: Diagnosis not present

## 2017-06-13 DIAGNOSIS — D509 Iron deficiency anemia, unspecified: Secondary | ICD-10-CM | POA: Diagnosis not present

## 2017-06-13 DIAGNOSIS — D631 Anemia in chronic kidney disease: Secondary | ICD-10-CM | POA: Diagnosis not present

## 2017-06-18 DIAGNOSIS — D631 Anemia in chronic kidney disease: Secondary | ICD-10-CM | POA: Diagnosis not present

## 2017-06-18 DIAGNOSIS — D509 Iron deficiency anemia, unspecified: Secondary | ICD-10-CM | POA: Diagnosis not present

## 2017-06-18 DIAGNOSIS — D689 Coagulation defect, unspecified: Secondary | ICD-10-CM | POA: Diagnosis not present

## 2017-06-18 DIAGNOSIS — N2581 Secondary hyperparathyroidism of renal origin: Secondary | ICD-10-CM | POA: Diagnosis not present

## 2017-06-18 DIAGNOSIS — R51 Headache: Secondary | ICD-10-CM | POA: Diagnosis not present

## 2017-06-18 DIAGNOSIS — N186 End stage renal disease: Secondary | ICD-10-CM | POA: Diagnosis not present

## 2017-06-20 DIAGNOSIS — N2581 Secondary hyperparathyroidism of renal origin: Secondary | ICD-10-CM | POA: Diagnosis not present

## 2017-06-20 DIAGNOSIS — R51 Headache: Secondary | ICD-10-CM | POA: Diagnosis not present

## 2017-06-20 DIAGNOSIS — N186 End stage renal disease: Secondary | ICD-10-CM | POA: Diagnosis not present

## 2017-06-20 DIAGNOSIS — D631 Anemia in chronic kidney disease: Secondary | ICD-10-CM | POA: Diagnosis not present

## 2017-06-20 DIAGNOSIS — D509 Iron deficiency anemia, unspecified: Secondary | ICD-10-CM | POA: Diagnosis not present

## 2017-06-20 DIAGNOSIS — D689 Coagulation defect, unspecified: Secondary | ICD-10-CM | POA: Diagnosis not present

## 2017-06-23 DIAGNOSIS — D631 Anemia in chronic kidney disease: Secondary | ICD-10-CM | POA: Diagnosis not present

## 2017-06-23 DIAGNOSIS — D509 Iron deficiency anemia, unspecified: Secondary | ICD-10-CM | POA: Diagnosis not present

## 2017-06-23 DIAGNOSIS — N2581 Secondary hyperparathyroidism of renal origin: Secondary | ICD-10-CM | POA: Diagnosis not present

## 2017-06-23 DIAGNOSIS — D689 Coagulation defect, unspecified: Secondary | ICD-10-CM | POA: Diagnosis not present

## 2017-06-23 DIAGNOSIS — N186 End stage renal disease: Secondary | ICD-10-CM | POA: Diagnosis not present

## 2017-06-23 DIAGNOSIS — R51 Headache: Secondary | ICD-10-CM | POA: Diagnosis not present

## 2017-06-25 DIAGNOSIS — R51 Headache: Secondary | ICD-10-CM | POA: Diagnosis not present

## 2017-06-25 DIAGNOSIS — D509 Iron deficiency anemia, unspecified: Secondary | ICD-10-CM | POA: Diagnosis not present

## 2017-06-25 DIAGNOSIS — N2581 Secondary hyperparathyroidism of renal origin: Secondary | ICD-10-CM | POA: Diagnosis not present

## 2017-06-25 DIAGNOSIS — D689 Coagulation defect, unspecified: Secondary | ICD-10-CM | POA: Diagnosis not present

## 2017-06-25 DIAGNOSIS — N186 End stage renal disease: Secondary | ICD-10-CM | POA: Diagnosis not present

## 2017-06-25 DIAGNOSIS — D631 Anemia in chronic kidney disease: Secondary | ICD-10-CM | POA: Diagnosis not present

## 2017-06-27 DIAGNOSIS — N2581 Secondary hyperparathyroidism of renal origin: Secondary | ICD-10-CM | POA: Diagnosis not present

## 2017-06-27 DIAGNOSIS — D631 Anemia in chronic kidney disease: Secondary | ICD-10-CM | POA: Diagnosis not present

## 2017-06-27 DIAGNOSIS — D689 Coagulation defect, unspecified: Secondary | ICD-10-CM | POA: Diagnosis not present

## 2017-06-27 DIAGNOSIS — R51 Headache: Secondary | ICD-10-CM | POA: Diagnosis not present

## 2017-06-27 DIAGNOSIS — D509 Iron deficiency anemia, unspecified: Secondary | ICD-10-CM | POA: Diagnosis not present

## 2017-06-27 DIAGNOSIS — N186 End stage renal disease: Secondary | ICD-10-CM | POA: Diagnosis not present

## 2017-06-29 DIAGNOSIS — N2581 Secondary hyperparathyroidism of renal origin: Secondary | ICD-10-CM | POA: Diagnosis not present

## 2017-06-29 DIAGNOSIS — D509 Iron deficiency anemia, unspecified: Secondary | ICD-10-CM | POA: Diagnosis not present

## 2017-06-29 DIAGNOSIS — R51 Headache: Secondary | ICD-10-CM | POA: Diagnosis not present

## 2017-06-29 DIAGNOSIS — D631 Anemia in chronic kidney disease: Secondary | ICD-10-CM | POA: Diagnosis not present

## 2017-06-29 DIAGNOSIS — N186 End stage renal disease: Secondary | ICD-10-CM | POA: Diagnosis not present

## 2017-06-29 DIAGNOSIS — D689 Coagulation defect, unspecified: Secondary | ICD-10-CM | POA: Diagnosis not present

## 2017-07-02 DIAGNOSIS — D509 Iron deficiency anemia, unspecified: Secondary | ICD-10-CM | POA: Diagnosis not present

## 2017-07-02 DIAGNOSIS — N186 End stage renal disease: Secondary | ICD-10-CM | POA: Diagnosis not present

## 2017-07-02 DIAGNOSIS — D631 Anemia in chronic kidney disease: Secondary | ICD-10-CM | POA: Diagnosis not present

## 2017-07-02 DIAGNOSIS — N2581 Secondary hyperparathyroidism of renal origin: Secondary | ICD-10-CM | POA: Diagnosis not present

## 2017-07-02 DIAGNOSIS — D689 Coagulation defect, unspecified: Secondary | ICD-10-CM | POA: Diagnosis not present

## 2017-07-02 DIAGNOSIS — R51 Headache: Secondary | ICD-10-CM | POA: Diagnosis not present

## 2017-07-03 ENCOUNTER — Other Ambulatory Visit: Payer: Self-pay | Admitting: Internal Medicine

## 2017-07-04 DIAGNOSIS — N186 End stage renal disease: Secondary | ICD-10-CM | POA: Diagnosis not present

## 2017-07-04 DIAGNOSIS — D631 Anemia in chronic kidney disease: Secondary | ICD-10-CM | POA: Diagnosis not present

## 2017-07-04 DIAGNOSIS — R51 Headache: Secondary | ICD-10-CM | POA: Diagnosis not present

## 2017-07-04 DIAGNOSIS — N2581 Secondary hyperparathyroidism of renal origin: Secondary | ICD-10-CM | POA: Diagnosis not present

## 2017-07-04 DIAGNOSIS — D509 Iron deficiency anemia, unspecified: Secondary | ICD-10-CM | POA: Diagnosis not present

## 2017-07-04 DIAGNOSIS — D689 Coagulation defect, unspecified: Secondary | ICD-10-CM | POA: Diagnosis not present

## 2017-07-06 DIAGNOSIS — N186 End stage renal disease: Secondary | ICD-10-CM | POA: Diagnosis not present

## 2017-07-06 DIAGNOSIS — D509 Iron deficiency anemia, unspecified: Secondary | ICD-10-CM | POA: Diagnosis not present

## 2017-07-06 DIAGNOSIS — R51 Headache: Secondary | ICD-10-CM | POA: Diagnosis not present

## 2017-07-06 DIAGNOSIS — D631 Anemia in chronic kidney disease: Secondary | ICD-10-CM | POA: Diagnosis not present

## 2017-07-06 DIAGNOSIS — D689 Coagulation defect, unspecified: Secondary | ICD-10-CM | POA: Diagnosis not present

## 2017-07-06 DIAGNOSIS — N2581 Secondary hyperparathyroidism of renal origin: Secondary | ICD-10-CM | POA: Diagnosis not present

## 2017-07-07 DIAGNOSIS — N186 End stage renal disease: Secondary | ICD-10-CM | POA: Diagnosis not present

## 2017-07-07 DIAGNOSIS — Z992 Dependence on renal dialysis: Secondary | ICD-10-CM | POA: Diagnosis not present

## 2017-07-07 DIAGNOSIS — I150 Renovascular hypertension: Secondary | ICD-10-CM | POA: Diagnosis not present

## 2017-07-08 ENCOUNTER — Other Ambulatory Visit: Payer: Self-pay

## 2017-07-08 ENCOUNTER — Emergency Department (HOSPITAL_COMMUNITY): Payer: Medicare Other

## 2017-07-08 ENCOUNTER — Encounter (HOSPITAL_COMMUNITY): Payer: Self-pay | Admitting: *Deleted

## 2017-07-08 ENCOUNTER — Observation Stay (HOSPITAL_COMMUNITY)
Admission: EM | Admit: 2017-07-08 | Discharge: 2017-07-09 | Disposition: A | Discharge status: Home / Self Care | Payer: Medicare Other | Attending: Internal Medicine | Admitting: Internal Medicine

## 2017-07-08 DIAGNOSIS — I700 Atherosclerosis of aorta: Secondary | ICD-10-CM

## 2017-07-08 DIAGNOSIS — R112 Nausea with vomiting, unspecified: Secondary | ICD-10-CM | POA: Insufficient documentation

## 2017-07-08 DIAGNOSIS — R079 Chest pain, unspecified: Secondary | ICD-10-CM | POA: Diagnosis present

## 2017-07-08 DIAGNOSIS — M170 Bilateral primary osteoarthritis of knee: Secondary | ICD-10-CM | POA: Insufficient documentation

## 2017-07-08 DIAGNOSIS — I083 Combined rheumatic disorders of mitral, aortic and tricuspid valves: Secondary | ICD-10-CM | POA: Diagnosis not present

## 2017-07-08 DIAGNOSIS — Z885 Allergy status to narcotic agent status: Secondary | ICD-10-CM | POA: Insufficient documentation

## 2017-07-08 DIAGNOSIS — I132 Hypertensive heart and chronic kidney disease with heart failure and with stage 5 chronic kidney disease, or end stage renal disease: Secondary | ICD-10-CM | POA: Diagnosis not present

## 2017-07-08 DIAGNOSIS — E875 Hyperkalemia: Secondary | ICD-10-CM | POA: Diagnosis not present

## 2017-07-08 DIAGNOSIS — E785 Hyperlipidemia, unspecified: Secondary | ICD-10-CM | POA: Insufficient documentation

## 2017-07-08 DIAGNOSIS — R0789 Other chest pain: Secondary | ICD-10-CM | POA: Diagnosis not present

## 2017-07-08 DIAGNOSIS — R61 Generalized hyperhidrosis: Secondary | ICD-10-CM | POA: Insufficient documentation

## 2017-07-08 DIAGNOSIS — Z7982 Long term (current) use of aspirin: Secondary | ICD-10-CM | POA: Diagnosis not present

## 2017-07-08 DIAGNOSIS — Z8719 Personal history of other diseases of the digestive system: Secondary | ICD-10-CM | POA: Insufficient documentation

## 2017-07-08 DIAGNOSIS — Z992 Dependence on renal dialysis: Secondary | ICD-10-CM | POA: Diagnosis not present

## 2017-07-08 DIAGNOSIS — E8889 Other specified metabolic disorders: Secondary | ICD-10-CM | POA: Diagnosis not present

## 2017-07-08 DIAGNOSIS — D631 Anemia in chronic kidney disease: Secondary | ICD-10-CM | POA: Diagnosis not present

## 2017-07-08 DIAGNOSIS — Z79899 Other long term (current) drug therapy: Secondary | ICD-10-CM | POA: Diagnosis not present

## 2017-07-08 DIAGNOSIS — Z9114 Patient's other noncompliance with medication regimen: Secondary | ICD-10-CM | POA: Insufficient documentation

## 2017-07-08 DIAGNOSIS — Z8673 Personal history of transient ischemic attack (TIA), and cerebral infarction without residual deficits: Secondary | ICD-10-CM | POA: Diagnosis not present

## 2017-07-08 DIAGNOSIS — N186 End stage renal disease: Secondary | ICD-10-CM | POA: Diagnosis not present

## 2017-07-08 DIAGNOSIS — Z9119 Patient's noncompliance with other medical treatment and regimen: Secondary | ICD-10-CM | POA: Diagnosis not present

## 2017-07-08 DIAGNOSIS — R0602 Shortness of breath: Secondary | ICD-10-CM | POA: Insufficient documentation

## 2017-07-08 DIAGNOSIS — R1013 Epigastric pain: Secondary | ICD-10-CM | POA: Insufficient documentation

## 2017-07-08 DIAGNOSIS — Z8249 Family history of ischemic heart disease and other diseases of the circulatory system: Secondary | ICD-10-CM

## 2017-07-08 DIAGNOSIS — R42 Dizziness and giddiness: Secondary | ICD-10-CM | POA: Diagnosis not present

## 2017-07-08 DIAGNOSIS — N2581 Secondary hyperparathyroidism of renal origin: Secondary | ICD-10-CM | POA: Diagnosis not present

## 2017-07-08 DIAGNOSIS — K219 Gastro-esophageal reflux disease without esophagitis: Secondary | ICD-10-CM | POA: Insufficient documentation

## 2017-07-08 DIAGNOSIS — R011 Cardiac murmur, unspecified: Secondary | ICD-10-CM | POA: Diagnosis not present

## 2017-07-08 DIAGNOSIS — I5030 Unspecified diastolic (congestive) heart failure: Secondary | ICD-10-CM

## 2017-07-08 DIAGNOSIS — R109 Unspecified abdominal pain: Secondary | ICD-10-CM | POA: Diagnosis not present

## 2017-07-08 DIAGNOSIS — I5032 Chronic diastolic (congestive) heart failure: Secondary | ICD-10-CM | POA: Insufficient documentation

## 2017-07-08 DIAGNOSIS — I878 Other specified disorders of veins: Secondary | ICD-10-CM

## 2017-07-08 DIAGNOSIS — I120 Hypertensive chronic kidney disease with stage 5 chronic kidney disease or end stage renal disease: Secondary | ICD-10-CM | POA: Diagnosis not present

## 2017-07-08 LAB — RENAL FUNCTION PANEL
ALBUMIN: 3.3 g/dL — AB (ref 3.5–5.0)
ANION GAP: 16 — AB (ref 5–15)
BUN: 38 mg/dL — ABNORMAL HIGH (ref 6–20)
CALCIUM: 8.9 mg/dL (ref 8.9–10.3)
CO2: 22 mmol/L (ref 22–32)
Chloride: 92 mmol/L — ABNORMAL LOW (ref 101–111)
Creatinine, Ser: 7 mg/dL — ABNORMAL HIGH (ref 0.44–1.00)
GFR calc Af Amer: 6 mL/min — ABNORMAL LOW (ref >60)
GFR calc non Af Amer: 5 mL/min — ABNORMAL LOW (ref >60)
GLUCOSE: 83 mg/dL (ref 65–99)
PHOSPHORUS: 6.6 mg/dL — AB (ref 2.5–4.6)
Potassium: 4.5 mmol/L (ref 3.5–5.1)
SODIUM: 130 mmol/L — AB (ref 135–145)

## 2017-07-08 LAB — BASIC METABOLIC PANEL
ANION GAP: 14 (ref 5–15)
BUN: 32 mg/dL — ABNORMAL HIGH (ref 6–20)
CALCIUM: 9.2 mg/dL (ref 8.9–10.3)
CO2: 21 mmol/L — AB (ref 22–32)
Chloride: 95 mmol/L — ABNORMAL LOW (ref 101–111)
Creatinine, Ser: 6.34 mg/dL — ABNORMAL HIGH (ref 0.44–1.00)
GFR calc Af Amer: 7 mL/min — ABNORMAL LOW (ref >60)
GFR, EST NON AFRICAN AMERICAN: 6 mL/min — AB (ref >60)
Glucose, Bld: 82 mg/dL (ref 65–99)
Potassium: 5.7 mmol/L — ABNORMAL HIGH (ref 3.5–5.1)
SODIUM: 130 mmol/L — AB (ref 135–145)

## 2017-07-08 LAB — TROPONIN I: Troponin I: <0.03 ng/mL (ref <0.03)

## 2017-07-08 LAB — CBC WITH DIFFERENTIAL/PLATELET
Basophils Absolute: 0 10*3/uL (ref 0.0–0.1)
Basophils Relative: 0 %
Eosinophils Absolute: 0 10*3/uL (ref 0.0–0.7)
Eosinophils Relative: 0 %
HCT: 36.3 % (ref 36.0–46.0)
Hemoglobin: 11.7 g/dL — ABNORMAL LOW (ref 12.0–15.0)
Lymphocytes Relative: 16 %
Lymphs Abs: 0.7 10*3/uL (ref 0.7–4.0)
MCH: 31 pg (ref 26.0–34.0)
MCHC: 32.2 g/dL (ref 30.0–36.0)
MCV: 96 fL (ref 78.0–100.0)
Monocytes Absolute: 0.3 10*3/uL (ref 0.1–1.0)
Monocytes Relative: 6 %
Neutro Abs: 3.4 10*3/uL (ref 1.7–7.7)
Neutrophils Relative %: 78 %
Platelets: 170 10*3/uL (ref 150–400)
RBC: 3.78 MIL/uL — ABNORMAL LOW (ref 3.87–5.11)
RDW: 16.9 % — ABNORMAL HIGH (ref 11.5–15.5)
WBC: 4.4 10*3/uL (ref 4.0–10.5)

## 2017-07-08 LAB — HEPATIC FUNCTION PANEL
ALBUMIN: 3.6 g/dL (ref 3.5–5.0)
ALT: 9 U/L — ABNORMAL LOW (ref 14–54)
AST: 18 U/L (ref 15–41)
Alkaline Phosphatase: 57 U/L (ref 38–126)
BILIRUBIN INDIRECT: 0.5 mg/dL (ref 0.3–0.9)
Bilirubin, Direct: 0.2 mg/dL (ref 0.1–0.5)
TOTAL PROTEIN: 7.3 g/dL (ref 6.5–8.1)
Total Bilirubin: 0.7 mg/dL (ref 0.3–1.2)

## 2017-07-08 LAB — I-STAT TROPONIN, ED: Troponin i, poc: 0 ng/mL (ref 0.00–0.08)

## 2017-07-08 LAB — BRAIN NATRIURETIC PEPTIDE: B Natriuretic Peptide: 230.1 pg/mL — ABNORMAL HIGH (ref 0.0–100.0)

## 2017-07-08 LAB — LIPASE, BLOOD: Lipase: 40 U/L (ref 11–51)

## 2017-07-08 MED ORDER — RENA-VITE PO TABS
1.0000 | ORAL_TABLET | Freq: Every day | ORAL | Status: DC
  Filled 2017-07-08: qty 1

## 2017-07-08 MED ORDER — ONDANSETRON 4 MG PO TBDP
4.0000 mg | ORAL_TABLET | Freq: Once | ORAL | Status: AC
  Administered 2017-07-08: 4 mg via ORAL
  Filled 2017-07-08: qty 1

## 2017-07-08 MED ORDER — CINACALCET HCL 30 MG PO TABS
60.0000 mg | ORAL_TABLET | Freq: Two times a day (BID) | ORAL | Status: DC
  Administered 2017-07-08 – 2017-07-09 (×2): 60 mg via ORAL
  Filled 2017-07-08 (×3): qty 2

## 2017-07-08 MED ORDER — CALCITRIOL 0.25 MCG PO CAPS
1.7500 ug | ORAL_CAPSULE | ORAL | Status: DC
  Administered 2017-07-09: 1.75 ug via ORAL
  Filled 2017-07-08: qty 7
  Filled 2017-07-08: qty 1

## 2017-07-08 MED ORDER — NITROGLYCERIN 0.4 MG SL SUBL: 0.4000 mg | SUBLINGUAL_TABLET | SUBLINGUAL | Status: DC | PRN

## 2017-07-08 MED ORDER — HEPARIN SODIUM (PORCINE) 5000 UNIT/ML IJ SOLN
5000.0000 [IU] | Freq: Three times a day (TID) | INTRAMUSCULAR | Status: DC
  Administered 2017-07-08 – 2017-07-09 (×3): 5000 [IU] via SUBCUTANEOUS
  Filled 2017-07-08 (×3): qty 1

## 2017-07-08 MED ORDER — ASPIRIN EC 81 MG PO TBEC
81.0000 mg | DELAYED_RELEASE_TABLET | Freq: Every day | ORAL | Status: DC
  Administered 2017-07-09: 81 mg via ORAL
  Filled 2017-07-08: qty 1

## 2017-07-08 MED ORDER — GI COCKTAIL ~~LOC~~
30.0000 mL | Freq: Once | ORAL | Status: AC
  Administered 2017-07-08: 30 mL via ORAL
  Filled 2017-07-08: qty 30

## 2017-07-08 MED ORDER — ACETAMINOPHEN 325 MG PO TABS
650.0000 mg | ORAL_TABLET | Freq: Four times a day (QID) | ORAL | Status: DC | PRN
  Administered 2017-07-09: 650 mg via ORAL
  Filled 2017-07-08: qty 2

## 2017-07-08 MED ORDER — SEVELAMER CARBONATE 800 MG PO TABS
800.0000 mg | ORAL_TABLET | Freq: Three times a day (TID) | ORAL | Status: DC
  Administered 2017-07-08 – 2017-07-09 (×3): 800 mg via ORAL
  Filled 2017-07-08 (×4): qty 1

## 2017-07-08 MED ORDER — ACETAMINOPHEN 650 MG RE SUPP: 650.0000 mg | Freq: Four times a day (QID) | RECTAL | Status: DC | PRN

## 2017-07-08 MED ORDER — SENNOSIDES-DOCUSATE SODIUM 8.6-50 MG PO TABS: 1.0000 | ORAL_TABLET | Freq: Every evening | ORAL | Status: DC | PRN

## 2017-07-08 MED ORDER — CALCIUM ACETATE (PHOS BINDER) 667 MG PO CAPS
2001.0000 mg | ORAL_CAPSULE | Freq: Three times a day (TID) | ORAL | Status: DC
  Administered 2017-07-09 (×2): 2001 mg via ORAL
  Filled 2017-07-08 (×3): qty 3

## 2017-07-08 NOTE — ED Triage Notes (Signed)
Pt here via [facility] from home for mid-sternal chest pain 10/10 that started this am.  Denies sob.  Given 324 asa and 1 nitro with not pain relief.  Dialysis was cut short on Sunday d/t "blood clots clogging the machine".  BP 180/67, hr 98%, cbg 73. EKG unremarkable per EMS.

## 2017-07-08 NOTE — ED Notes (Signed)
Pt speaking with family on the phone

## 2017-07-08 NOTE — H&P (Signed)
Date: 07/08/2017               Patient Name:  Nancy [name] MRN: [id number]  DOB: May 27, 1941 Age / Sex: 77 y.o., female   PCP: Sid Falcon, MD         Medical Service: Internal Medicine Teaching Service         Attending Physician: Dr. Rebeca Alert Raynaldo Opitz, MD    First Contact: Dr. Lars Mage Pager: [phone]  Second Contact: Dr. Einar Gip Pager: [phone]       After Hours (After 5p/  First Contact Pager: [phone]  weekends / holidays): Second Contact Pager: [phone]   Chief Complaint: CP, SOB  History of Present Illness: 77 year old female with past medical history of CVA (6 months ago, no residual deficits), AMS, HFpEF, ESRD on dialysis for 3 months now, presents with left-sided chest pain that began this morning after getting out of the bathtub.  Patient reports getting out of the bathtub and her chest feeling like a cow was sitting on it.  She said she then began to break out into a sweat and felt sick to her stomach and vomited.  The pain lasted for about 2 hours she was short of breath and dizzy had to go lay down.  Her daughter called 911, The pain did not radiate, and was only relieved once EMS arrived and she was given aspirin and sublingual nitro.  By the time she arrived in the emergency department her chest pain had mostly eased off.    ED course: Patient arrived with stable vitals she had a elevated BNP at about 230, she had one negative troponin.  Her EKG was negative for signs of ACS.  She had a negative lipase and normal LFTs, her chest x-ray was unremarkable.    Meds:  Current Meds  Medication Sig  . acetaminophen (TYLENOL) 500 MG tablet Take 1,000 mg by mouth every 8 (eight) hours as needed for mild pain, moderate pain, fever or headache.   Marland Kitchen amLODipine (NORVASC) 10 MG tablet Take 1 tablet (10 mg total) by mouth daily.  Marland Kitchen aspirin EC 81 MG EC tablet Take 1 tablet (81 mg total) by mouth daily. (Patient taking differently: Take 162 mg by mouth daily. )  .  calcitRIOL (ROCALTROL) 0.25 MCG capsule Take 1 capsule (0.25 mcg total) by mouth daily.  . calcium acetate (PHOSLO) 667 MG capsule Take 2 capsules (1,334 mg total) by mouth 3 (three) times daily with meals.  . cloNIDine (CATAPRES - DOSED IN MG/24 HR) 0.2 mg/24hr patch Place 1 patch (0.2 mg total) onto the skin every 7 (seven) days.  . SENSIPAR 60 MG tablet Take 1 tablet (60 mg total) by mouth daily.  . traMADol (ULTRAM) 50 MG tablet Take 1 tablet (50 mg total) by mouth every 6 (six) hours as needed for severe pain.     Allergies: Allergies as of 07/08/2017 - Review Complete 07/08/2017  Allergen Reaction Noted  . Hydrocodone-acetaminophen Nausea And Vomiting and Other (See Comments)    Past Medical History:  Diagnosis Date  . Anemia   . Aortic stenosis   . Bacterial sinusitis 09/17/2011  . CHF (congestive heart failure) ([place])   . CKD (chronic kidney disease) stage 4, GFR 15-29 ml/min ([place]) 08/11/2006   Cr continues to increase. Proteinuria on UA 02/10/12.    . Colitis   . CVA (cerebrovascular accident) [facility])    New hemorrhagic per CT scan '09  . Diverticulosis of colon   .  Dysfunctional uterine bleeding   . Fecal impaction ([place])   . Headache(784.0)   . Heart murmur   . HERNIORRHAPHY, HX OF 08/11/2006  . Hypertension   . OA (osteoarthritis)    bilateral knees  . Postmenopausal   . Pulmonary nodule   . TINEA CRURIS 01/12/2007    Family History:  Family History  Problem Relation Age of Onset  . Hypertension Mother   . Congestive Heart Failure Mother   . Heart attack Brother 69    Social History:  Social History   Socioeconomic History  . Marital status: Married    Spouse name: None  . Number of children: None  . Years of education: None  . Highest education level: None  Social Needs  . Financial resource strain: None  . Food insecurity - worry: None  . Food insecurity - inability: None  . Transportation needs - medical: None  . Transportation needs - non-medical: None    Occupational History  . None  Tobacco Use  . Smoking status: Never Smoker  . Smokeless tobacco: Never Used  Substance and Sexual Activity  . Alcohol use: No    Alcohol/week: 0.0 oz  . Drug use: No    Comment: 08/15/08 UDS + cocaine  . Sexual activity: Not Currently  Other Topics Concern  . None  Social History Narrative   Married, lives with her husband. 1 child.           Review of Systems: A complete ROS was negative except as per HPI.   Physical Exam: Blood pressure (!) 156/67, pulse 90, temperature 98.3 F (36.8 C), temperature source Oral, resp. rate 16, height 5\' 2"  (1.575 m), weight 126 lb (57.2 kg), SpO2 95 %. Physical Exam  Constitutional: She is oriented to person, place, and time. No distress.  HENT:  Head: Normocephalic and atraumatic.  Neck: JVD (8-9cm H2O) present.  Cardiovascular: Normal rate and regular rhythm. Exam reveals no gallop and no friction rub.  Murmur (2/6 systolic murmur best heard in RUSB) heard. Pulmonary/Chest: Effort normal and breath sounds normal. No respiratory distress. She has no wheezes. She has no rales. She exhibits no tenderness.  Abdominal: Soft. Bowel sounds are normal. She exhibits no distension and no mass. There is tenderness (LLQ). There is no rebound and no guarding.  Musculoskeletal:       Right lower leg: She exhibits edema (minimal).       Left lower leg: She exhibits edema (minimal).  Neurological: She is alert and oriented to person, place, and time.  Skin: No rash noted. She is not diaphoretic. No erythema.    EKG: personally reviewed my interpretation is sinus rhythm, non specific T wave changes unchanged from prior  CXR: personally reviewed my interpretation is no acute abnormality  Assessment & Plan by Problem: Active Problems:   Chest pain  Chest Pain: Pt with symptoms concerning for angina this am.  Does have Moderate AS as well, could have caused some pain and presyncope, also somewhat fluid overload on my  exam.  Doesn't seem to be very compliant with home bp meds, does not check at home.    -trend troponins, continuous cardiac monitoring -good bp control -pt will get dialysis this evening, will pull some fluid -repeat ECHO  ESRD: began dialysis 3 months ago, nephrology following  -first dialysis session this evening  HFpEF: pt with G1DD, EF 55-60%, moderate AS  -will repeat ECHO    Dispo: Admit patient to Observation with expected length of stay  less than 2 midnights.  Signed: Katherine Roan, MD 07/08/2017, 6:12 PM  Vickki Muff MD PGY-1 Internal Medicine Pager # [phone]

## 2017-07-08 NOTE — ED Provider Notes (Signed)
[facility] EMERGENCY DEPARTMENT Provider Note   CSN: [id number] Arrival date & time: 07/08/17  1119     History   Chief Complaint Chief Complaint  Patient presents with  . Chest Pain    HPI [place] B Conti is a 77 y.o. female.  77yo F w/ PMH including ESRD on HD, CVA, CHF who p/w chest pain. This morning when she was getting out of the bath tub, she began having central chest pain/heaviness that was 10/10 in intensity, associated with N/V and diaphoresis, no SOB. She was given NTG and ASA that eventually helped, CP lasted ~1 hour. Currently her pain is very mild. She reports some abdominal pain but she has had this for a very long time. She coughed last night but none today, no fevers or diarrhea. No recent travel. Her last dialysis session was cut a Linsie Lupo short due to her line clogging with blood clots.    The history is provided by the patient.    Past Medical History:  Diagnosis Date  . Anemia   . Aortic stenosis   . Bacterial sinusitis 09/17/2011  . CHF (congestive heart failure) ([place])   . CKD (chronic kidney disease) stage 4, GFR 15-29 ml/min ([place]) 08/11/2006   Cr continues to increase. Proteinuria on UA 02/10/12.    . Colitis   . CVA (cerebrovascular accident) [facility])    New hemorrhagic per CT scan '09  . Diverticulosis of colon   . Dysfunctional uterine bleeding   . Fecal impaction ([place])   . Headache(784.0)   . Heart murmur   . HERNIORRHAPHY, HX OF 08/11/2006  . Hypertension   . OA (osteoarthritis)    bilateral knees  . Postmenopausal   . Pulmonary nodule   . TINEA CRURIS 01/12/2007    Patient Active Problem List   Diagnosis Date Noted  . ESRD (end stage renal disease) ([place]) 04/23/2017  . Symptomatic anemia 02/17/2017  . Secondary hyperparathyroidism of renal origin ([place]) 01/15/2017  . Tachycardia   . Mitral valve annular calcification   . Malnutrition of moderate degree 12/09/2016  . Dizziness 12/08/2016  . Constipation 10/15/2016  . Angina  at rest [facility])   . Goals of care, counseling/discussion   . Palliative care encounter   . Metabolic acidosis 19/62/2297  . Aortic stenosis 05/21/2016  . Anemia associated with chronic renal failure 02/01/2016  . Atherosclerosis of aorta ([place]) 01/11/2015  . End stage renal disease ([place]) 02/04/2013  . Non-intractable vomiting with nausea 08/17/2012  . Glaucoma 03/18/2012  . Health care maintenance 09/17/2011  . Osteoarthrosis involving lower leg 10/31/2008  . History of CVA (cerebrovascular accident) 01/28/2008  . Hyperlipidemia 02/13/2007  . PULMONARY NODULES 01/12/2007  . Left ventricular hypertrophy 09/02/2006  . Essential hypertension 08/11/2006  . GERD 08/11/2006  . Diverticulitis of colon 08/11/2006    Past Surgical History:  Procedure Laterality Date  . ABDOMINAL HYSTERECTOMY    . AV FISTULA PLACEMENT Left 02/19/2017   Procedure: CREATION OF LEFT ARM BRACHIOCEPHALIC ARTERIOVENOUS (AV) FISTULA;  Surgeon: Rosetta Posner, MD;  Location: [facility];  Service: Vascular;  Laterality: Left;  . BASCILIC VEIN TRANSPOSITION Left 04/23/2017   Procedure: BASILIC VEIN TRANSPOSITION SECOND STAGE;  Surgeon: Rosetta Posner, MD;  Location: [facility];  Service: Vascular;  Laterality: Left;  . CHOLECYSTECTOMY  2009  . COLONOSCOPY    . INGUINAL HERNIA REPAIR  2008  . INSERTION OF DIALYSIS CATHETER Right 02/19/2017   Procedure: INSERTION OF TUNNELED DIALYSIS CATHETER - RIGHT INTERNAL JUGULAR  PLACEMENT;  Surgeon: Rosetta Posner, MD;  Location: [place] [facility];  Service: Vascular;  Laterality: Right;  . IRIDOTOMY / IRIDECTOMY     Laser, right eye 12/26/11 left eye 01/24/12  . MASS EXCISION Left 05/07/2013   Procedure: EXCISION CYST;  Surgeon: Myrtha Mantis., MD;  Location: [facility];  Service: Ophthalmology;  Laterality: Left;    OB History    No data available       Home Medications    Prior to Admission medications   Medication Sig Start Date End Date Taking? Authorizing Provider    acetaminophen (TYLENOL) 500 MG tablet Take 1,000 mg by mouth every 8 (eight) hours as needed for mild pain, moderate pain, fever or headache.    Yes [provider]  amLODipine (NORVASC) 10 MG tablet Take 1 tablet (10 mg total) by mouth daily. 06/10/17  Yes Sid Falcon, MD  aspirin EC 81 MG EC tablet Take 1 tablet (81 mg total) by mouth daily. Patient taking differently: Take 162 mg by mouth daily.  12/10/16  Yes Hoffman, Jessica Ratliff, DO  calcitRIOL (ROCALTROL) 0.25 MCG capsule Take 1 capsule (0.25 mcg total) by mouth daily. 01/15/17  Yes Sid Falcon, MD  calcium acetate (PHOSLO) 667 MG capsule Take 2 capsules (1,334 mg total) by mouth 3 (three) times daily with meals. 02/22/17  Yes Lacroce, Hulen Shouts, MD  cloNIDine (CATAPRES - DOSED IN MG/24 HR) 0.2 mg/24hr patch Place 1 patch (0.2 mg total) onto the skin every 7 (seven) days. 01/15/17 01/15/18 Yes Sid Falcon, MD  SENSIPAR 60 MG tablet Take 1 tablet (60 mg total) by mouth daily. 01/15/17  Yes Sid Falcon, MD  traMADol (ULTRAM) 50 MG tablet Take 1 tablet (50 mg total) by mouth every 6 (six) hours as needed for severe pain. 04/24/17  Yes Rhyne, Samantha J, PA-C  carvedilol (COREG) 12.5 MG tablet Take 12.5 mg by mouth 2 (two) times daily. 02/17/17   [provider]  nitroGLYCERIN (NITROSTAT) 0.3 MG SL tablet Place 1 tablet (0.3 mg total) under the tongue every 5 (five) minutes as needed for chest pain. 02/27/17   Sid Falcon, MD  sevelamer carbonate (RENVELA) 800 MG tablet Take 800 mg by mouth 3 (three) times daily. 02/10/17   [provider]  traMADol (ULTRAM) 50 MG tablet Take 1 tablet (50 mg total) by mouth every 6 (six) hours as needed for severe pain. Patient not taking: Reported on 07/08/2017 04/24/17   Ulyses Amor, PA-C    Family History Family History  Problem Relation Age of Onset  . Hypertension Mother   . Heart attack Mother   . Heart disease Mother     Social History Social History    Tobacco Use  . Smoking status: Never Smoker  . Smokeless tobacco: Never Used  Substance Use Topics  . Alcohol use: No    Alcohol/week: 0.0 oz  . Drug use: No    Comment: 08/15/08 UDS + cocaine     Allergies   Hydrocodone-acetaminophen   Review of Systems Review of Systems All other systems reviewed and are negative except that which was mentioned in HPI   Physical Exam Updated Vital Signs BP (!) 115/102   Pulse (!) 102   Temp 98.3 F (36.8 C) (Oral)   Resp (!) 22   Ht 5\' 2"  (1.575 m)   Wt 57.2 kg (126 lb)   SpO2 99%   BMI 23.05 kg/m   Physical Exam  Constitutional: She is oriented to person, place, and time. She appears well-developed and well-nourished. No distress.  HENT:  Head: Normocephalic and atraumatic.  Moist mucous membranes  Eyes: Conjunctivae are normal. Pupils are equal, round, and reactive to light.  Neck: Neck supple.  Cardiovascular: Normal rate and regular rhythm.  Murmur heard.  Systolic murmur is present. Pulmonary/Chest: Effort normal and breath sounds normal.  Abdominal: Soft. Bowel sounds are normal. She exhibits no distension. There is no tenderness.  Musculoskeletal: She exhibits no edema.  Neurological: She is alert and oriented to person, place, and time.  Fluent speech  Skin: Skin is warm and dry.  Central line in R upper chest with no redness or drainage  Psychiatric: She has a normal mood and affect. Judgment normal.  Nursing note and vitals reviewed.    ED Treatments / Results  Labs (all labs ordered are listed, but only abnormal results are displayed) Labs Reviewed  BASIC METABOLIC PANEL - Abnormal; Notable for the following components:      Result Value   Sodium 130 (*)    Potassium 5.7 (*)    Chloride 95 (*)    CO2 21 (*)    BUN 32 (*)    Creatinine, Ser 6.34 (*)    GFR calc non Af Amer 6 (*)    GFR calc Af Amer 7 (*)    All other components within normal limits  CBC WITH DIFFERENTIAL/PLATELET - Abnormal; Notable  for the following components:   RBC 3.78 (*)    Hemoglobin 11.7 (*)    RDW 16.9 (*)    All other components within normal limits  BRAIN NATRIURETIC PEPTIDE - Abnormal; Notable for the following components:   B Natriuretic Peptide 230.1 (*)    All other components within normal limits  HEPATIC FUNCTION PANEL - Abnormal; Notable for the following components:   ALT 9 (*)    All other components within normal limits  LIPASE, BLOOD  I-STAT TROPONIN, ED    EKG  EKG Interpretation  Date/Time:  Tuesday July 08 2017 11:52:17 EST Ventricular Rate:  95 PR Interval:    QRS Duration: 115 QT Interval:  373 QTC Calculation: 469 R Axis:   25 Text Interpretation:  Sinus rhythm Nonspecific intraventricular conduction delay Nonspecific T abnormalities, lateral leads Minimal ST elevation, anterior leads No significant change since last tracing Confirmed by Theotis Burrow [phone]) on 07/08/2017 12:19:53 PM       Radiology Dg Chest 2 View  Result Date: 07/08/2017 CLINICAL DATA:  Acute onset of left-sided chest pain this morning. Chronic kidney disease on dialysis. EXAM: CHEST  2 VIEW COMPARISON:  02/19/2017 FINDINGS: The heart size and mediastinal contours are within normal limits. Aortic atherosclerosis. Right jugular dual-lumen central venous dialysis catheter remains in appropriate position. Both lungs are clear. No evidence of pleural effusion. IMPRESSION: No active cardiopulmonary disease. Electronically Signed   By: Earle Gell M.D.   On: 07/08/2017 12:44    Procedures Procedures (including critical care time)  Medications Ordered in ED Medications - No data to display   Initial Impression / Assessment and Plan / ED Course  I have reviewed the triage vital signs and the nursing notes.  Pertinent labs & imaging results that were available during my care of the patient were reviewed by me and considered in my medical decision making (see chart for details).    Pt w/ episode of CP this  morning w/ N/V and diaphoresis, well appearing and comfortable on exam w/ reassuring  VS. EKG without ischemic or hyperkalemic changes.  Initial troponin negative.  Labs notable for sodium 130, potassium 5.7, BNP 230.  I am concerned about her episode of chest pain with associated vomiting and diaphoresis especially given her multiple risk factors for ACS.  Recommended admission for chest pain rule out.  Contacted internal medicine teaching service for admission.  I also spoke with nephrology, Juanell Fairly, for dialysis while in hospital. Pt admitted for further evaluation.  Final Clinical Impressions(s) / ED Diagnoses   Final diagnoses:  Chest pain, unspecified type  Hyperkalemia    ED Discharge Orders    None       Gunter Conde, Wenda Overland, MD 07/08/17 1644

## 2017-07-08 NOTE — Consult Note (Signed)
[facility] Renal Consultation Note    Indication for Consultation:  Management of ESRD/hemodialysis; anemia, hypertension/volume and secondary hyperparathyroidism PCP: Sid Falcon, MD  HPI: Nancy [name] is a 77 y.o. female with ESRD on hemodialysis MWF at [facility]. PMH HTN, CVA, AS, CHF (last EF 55-60% 12/09/16) osteoarthritis, AOCD, SHPT. Last HD12/30/18 on holiday schedule. Ran full treatment, left at EDW.   Patient says she developed chest pain early this AM. Localizes to epigastric, describes as "sharp" and "burning" with associated diaphoresis and nausea. Says it comes and goes. [place] Daughter called and she requested to be brought to ED. K+ was found to be 5.7 however patient has been having HD on 3.0 K+ bath. Last K+ 5.8 in HD center 07/02/17. Weekly K+ levels not being checked. EKG-SR no acute changes, CXR unremarkable, troponin 0.0 rest of labs basically unremarkable. She has poorly developed LUA AVF which she reports staff "pulling clots" from AVF. She denies fever, chills, + nausea, + diaphoresis, denies SOB, no abdominal pain, denies bloody or tarry stool. No vision or hearing changes, no mechanical falls. She is being admitted by Internal Medicine for chest pain. She will have HD off schedule today.   Past Medical History:  Diagnosis Date  . Anemia   . Aortic stenosis   . Bacterial sinusitis 09/17/2011  . CHF (congestive heart failure) ([place])   . CKD (chronic kidney disease) stage 4, GFR 15-29 ml/min ([place]) 08/11/2006   Cr continues to increase. Proteinuria on UA 02/10/12.    . Colitis   . CVA (cerebrovascular accident) [facility])    New hemorrhagic per CT scan '09  . Diverticulosis of colon   . Dysfunctional uterine bleeding   . Fecal impaction ([place])   . Headache(784.0)   . Heart murmur   . HERNIORRHAPHY, HX OF 08/11/2006  . Hypertension   . OA (osteoarthritis)    bilateral knees  . Postmenopausal   . Pulmonary nodule   . TINEA CRURIS  01/12/2007   Past Surgical History:  Procedure Laterality Date  . ABDOMINAL HYSTERECTOMY    . AV FISTULA PLACEMENT Left 02/19/2017   Procedure: CREATION OF LEFT ARM BRACHIOCEPHALIC ARTERIOVENOUS (AV) FISTULA;  Surgeon: Rosetta Posner, MD;  Location: [facility];  Service: Vascular;  Laterality: Left;  . BASCILIC VEIN TRANSPOSITION Left 04/23/2017   Procedure: BASILIC VEIN TRANSPOSITION SECOND STAGE;  Surgeon: Rosetta Posner, MD;  Location: [facility];  Service: Vascular;  Laterality: Left;  . CHOLECYSTECTOMY  2009  . COLONOSCOPY    . INGUINAL HERNIA REPAIR  2008  . INSERTION OF DIALYSIS CATHETER Right 02/19/2017   Procedure: INSERTION OF TUNNELED DIALYSIS CATHETER - RIGHT INTERNAL JUGULAR PLACEMENT;  Surgeon: Rosetta Posner, MD;  Location: [facility];  Service: Vascular;  Laterality: Right;  . IRIDOTOMY / IRIDECTOMY     Laser, right eye 12/26/11 left eye 01/24/12  . MASS EXCISION Left 05/07/2013   Procedure: EXCISION CYST;  Surgeon: Myrtha Mantis., MD;  Location: [place];  Service: Ophthalmology;  Laterality: Left;   Family History  Problem Relation Age of Onset  . Hypertension Mother   . Heart attack Mother   . Heart disease Mother    Social History:  reports that  has never smoked. she has never used smokeless tobacco. She reports that she does not drink alcohol or use drugs. Allergies  Allergen Reactions  . Hydrocodone-Acetaminophen Nausea And Vomiting and Other (See Comments)    Dizziness (also)   Prior  to Admission medications   Medication Sig Start Date End Date Taking? Authorizing Provider  acetaminophen (TYLENOL) 500 MG tablet Take 1,000 mg by mouth every 8 (eight) hours as needed for mild pain, moderate pain, fever or headache.    Yes [provider]  amLODipine (NORVASC) 10 MG tablet Take 1 tablet (10 mg total) by mouth daily. 06/10/17  Yes Sid Falcon, MD  aspirin EC 81 MG EC tablet Take 1 tablet (81 mg total) by mouth daily. Patient taking differently:  Take 162 mg by mouth daily.  12/10/16  Yes Hoffman, Jessica Ratliff, DO  calcitRIOL (ROCALTROL) 0.25 MCG capsule Take 1 capsule (0.25 mcg total) by mouth daily. 01/15/17  Yes Sid Falcon, MD  calcium acetate (PHOSLO) 667 MG capsule Take 2 capsules (1,334 mg total) by mouth 3 (three) times daily with meals. 02/22/17  Yes Lacroce, Hulen Shouts, MD  cloNIDine (CATAPRES - DOSED IN MG/24 HR) 0.2 mg/24hr patch Place 1 patch (0.2 mg total) onto the skin every 7 (seven) days. 01/15/17 01/15/18 Yes Sid Falcon, MD  SENSIPAR 60 MG tablet Take 1 tablet (60 mg total) by mouth daily. 01/15/17  Yes Sid Falcon, MD  traMADol (ULTRAM) 50 MG tablet Take 1 tablet (50 mg total) by mouth every 6 (six) hours as needed for severe pain. 04/24/17  Yes Rhyne, Samantha J, PA-C  carvedilol (COREG) 12.5 MG tablet Take 12.5 mg by mouth 2 (two) times daily. 02/17/17   [provider]  nitroGLYCERIN (NITROSTAT) 0.3 MG SL tablet Place 1 tablet (0.3 mg total) under the tongue every 5 (five) minutes as needed for chest pain. 02/27/17   Sid Falcon, MD  sevelamer carbonate (RENVELA) 800 MG tablet Take 800 mg by mouth 3 (three) times daily. 02/10/17   [provider]  traMADol (ULTRAM) 50 MG tablet Take 1 tablet (50 mg total) by mouth every 6 (six) hours as needed for severe pain. Patient not taking: Reported on 07/08/2017 04/24/17   Ulyses Amor, PA-C   No current facility-administered medications for this encounter.    Current Outpatient Medications  Medication Sig Dispense Refill  . acetaminophen (TYLENOL) 500 MG tablet Take 1,000 mg by mouth every 8 (eight) hours as needed for mild pain, moderate pain, fever or headache.     Marland Kitchen amLODipine (NORVASC) 10 MG tablet Take 1 tablet (10 mg total) by mouth daily. 30 tablet 5  . aspirin EC 81 MG EC tablet Take 1 tablet (81 mg total) by mouth daily. (Patient taking differently: Take 162 mg by mouth daily. ) 30 tablet 1  . calcitRIOL (ROCALTROL) 0.25 MCG capsule Take 1  capsule (0.25 mcg total) by mouth daily. 90 capsule 0  . calcium acetate (PHOSLO) 667 MG capsule Take 2 capsules (1,334 mg total) by mouth 3 (three) times daily with meals. 90 capsule 1  . cloNIDine (CATAPRES - DOSED IN MG/24 HR) 0.2 mg/24hr patch Place 1 patch (0.2 mg total) onto the skin every 7 (seven) days. 12 patch 2  . SENSIPAR 60 MG tablet Take 1 tablet (60 mg total) by mouth daily. 30 tablet 6  . traMADol (ULTRAM) 50 MG tablet Take 1 tablet (50 mg total) by mouth every 6 (six) hours as needed for severe pain. 10 tablet 0  . carvedilol (COREG) 12.5 MG tablet Take 12.5 mg by mouth 2 (two) times daily.    . nitroGLYCERIN (NITROSTAT) 0.3 MG SL tablet Place 1 tablet (0.3 mg total) under the tongue every 5 (five) minutes  as needed for chest pain. 30 tablet 0  . sevelamer carbonate (RENVELA) 800 MG tablet Take 800 mg by mouth 3 (three) times daily.  5  . traMADol (ULTRAM) 50 MG tablet Take 1 tablet (50 mg total) by mouth every 6 (six) hours as needed for severe pain. (Patient not taking: Reported on 07/08/2017) 6 tablet 0   Labs: Basic Metabolic Panel: Recent Labs  Lab 07/08/17 1212  NA 130*  K 5.7*  CL 95*  CO2 21*  GLUCOSE 82  BUN 32*  CREATININE 6.34*  CALCIUM 9.2   Liver Function Tests: Recent Labs  Lab 07/08/17 1454  AST 18  ALT 9*  ALKPHOS 57  BILITOT 0.7  PROT 7.3  ALBUMIN 3.6   Recent Labs  Lab 07/08/17 1454  LIPASE 40   No results for input(s): AMMONIA in the last 168 hours. CBC: Recent Labs  Lab 07/08/17 1212  WBC 4.4  NEUTROABS 3.4  HGB 11.7*  HCT 36.3  MCV 96.0  PLT 170   Cardiac Enzymes: No results for input(s): CKTOTAL, CKMB, CKMBINDEX, TROPONINI in the last 168 hours. CBG: No results for input(s): GLUCAP in the last 168 hours. Iron Studies: No results for input(s): IRON, TIBC, TRANSFERRIN, FERRITIN in the last 72 hours. Studies/Results: Dg Chest 2 View  Result Date: 07/08/2017 CLINICAL DATA:  Acute onset of left-sided chest pain this morning.  Chronic kidney disease on dialysis. EXAM: CHEST  2 VIEW COMPARISON:  02/19/2017 FINDINGS: The heart size and mediastinal contours are within normal limits. Aortic atherosclerosis. Right jugular dual-lumen central venous dialysis catheter remains in appropriate position. Both lungs are clear. No evidence of pleural effusion. IMPRESSION: No active cardiopulmonary disease. Electronically Signed   By: Earle Gell M.D.   On: 07/08/2017 12:44    ROS: As per HPI otherwise negative.   Physical Exam: Vitals:   07/08/17 1155 07/08/17 1156 07/08/17 1200 07/08/17 1516  BP: (!) 143/60  134/77 (!) 115/102  Pulse: 95  94 (!) 102  Resp: 16  15 (!) 22  Temp: 98.3 F (36.8 C)     TempSrc: Oral     SpO2: 98%  99% 99%  Weight:  57.2 kg (126 lb)    Height:  5\' 2"  (1.575 m)       General: Very pleasant elderly female in no acute distress. Head: Normocephalic, atraumatic, sclera non-icteric, mucus membranes are moist Neck: Supple. JVD not elevated. Lungs: Clear bilaterally to auscultation without wheezes, rales, or rhonchi. Breathing is unlabored. Heart: RRR with S1 S2. 3/6 harsh systolic M. No R/G Abdomen: Soft, non-tender, non-distended with normoactive bowel sounds. No rebound/guarding. No obvious abdominal masses. M-S:  Strength and tone appear normal for age. Lower extremities:without edema or ischemic changes, no open wounds  Neuro: Alert and oriented X 3. Moves all extremities spontaneously. Psych:  Responds to questions appropriately with a normal affect. Dialysis Access: LUA AVF poorly developed + bruit. RIJ TDC with only band-aide over site.   Dialysis Orders: [facility] MWF 4 hrs 180NRe 300/800 56.5 kg 3.0K/2.5 Ca   -Heparin 3000 units IV TIW -Calcitriol 1.75 mcg IV TIW -Venofer 50 mg IV q weekly (has not been given-recent Fe load 100 mg venofer X 10 for [place] 26%!) -Mircera 50 mcg IV q 2 weeks (last dose 07/02/17 Last HGB 10.4 07/02/17)  BMD meds:  -Renvela 800 mg PO TID AC -Calcium acetate  667 mg 3 caps TID AC -Sensipar 60 mg PO BID  Last Phos 9.3 PTH 1132 07/02/17 (doubt compliance with  BMD meds)  Assessment/Plan: 1.  Chest pain: localizing to epigastric area. EKG unremarkable, 1st POC troponin 0.0. Per primary.  2. Mild hyperkalemia: K+ 5.7. has been having HD on 3.0 K bath. Also having issues with AVF-using catheter. Will order fistulagram in IR tomorrow.  3.  ESRD - MWF. HD off schedule tonight largely due to staffing in HD unit tomorrow. Does not appear volume overloaded. Use 2.0 K bath.  4.  Hypertension/volume  - BP well controlled, no evidence of volume overload per exam or CXR. Minimal UF to OP EDW 56.5 kg 5.  Anemia  - HGB 11.7 ESA not due until next week. Follow HGB. Recent excessive Fe load. Will hold weekly Fe for now.  6.  Metabolic bone disease - Add phos/Albumin to labs. Non-compliance with binders noted. Continue binders, VDRA, senispar.  7.  Nutrition -Renal diet, nepro renal vits 8.   Poorly developed AVF-pulling clots. Fistulagram per IR tomorrow if possible.   Rita H. Owens Shark, NP-C 07/08/2017, 4:17 PM  [employer] [phone]  I have seen and examined this patient and agree with plan and assessment in the above note with renal recommendations/intervention highlighted.  Pt is willing to have HD today given her hyperkalemia.  She is having issues with her AVF and will use her TDC today and 2 K bath.  Will need to have fistulogram to evaluate her poorly functioning AVF which can be done here during her hospital stay or as an outpatient.  Will continue to follow while she remains an inpatient and her chest pain is worked up.  Broadus John A Neville Walston,MD 07/08/2017 5:05 PM

## 2017-07-08 NOTE — ED Notes (Signed)
Attempting to page admitting for bed request. Paged by Ivin Booty, Network engineer

## 2017-07-09 ENCOUNTER — Observation Stay (HOSPITAL_BASED_OUTPATIENT_CLINIC_OR_DEPARTMENT_OTHER): Payer: Medicare Other

## 2017-07-09 ENCOUNTER — Other Ambulatory Visit: Payer: Self-pay

## 2017-07-09 DIAGNOSIS — R0789 Other chest pain: Secondary | ICD-10-CM

## 2017-07-09 DIAGNOSIS — Z885 Allergy status to narcotic agent status: Secondary | ICD-10-CM | POA: Diagnosis not present

## 2017-07-09 DIAGNOSIS — I361 Nonrheumatic tricuspid (valve) insufficiency: Secondary | ICD-10-CM | POA: Diagnosis not present

## 2017-07-09 DIAGNOSIS — Z992 Dependence on renal dialysis: Secondary | ICD-10-CM | POA: Diagnosis not present

## 2017-07-09 DIAGNOSIS — R112 Nausea with vomiting, unspecified: Secondary | ICD-10-CM | POA: Diagnosis not present

## 2017-07-09 DIAGNOSIS — R42 Dizziness and giddiness: Secondary | ICD-10-CM | POA: Diagnosis not present

## 2017-07-09 DIAGNOSIS — N186 End stage renal disease: Secondary | ICD-10-CM | POA: Diagnosis not present

## 2017-07-09 DIAGNOSIS — D631 Anemia in chronic kidney disease: Secondary | ICD-10-CM | POA: Diagnosis not present

## 2017-07-09 DIAGNOSIS — I120 Hypertensive chronic kidney disease with stage 5 chronic kidney disease or end stage renal disease: Secondary | ICD-10-CM | POA: Diagnosis not present

## 2017-07-09 DIAGNOSIS — R0602 Shortness of breath: Secondary | ICD-10-CM | POA: Diagnosis not present

## 2017-07-09 DIAGNOSIS — R61 Generalized hyperhidrosis: Secondary | ICD-10-CM | POA: Diagnosis not present

## 2017-07-09 DIAGNOSIS — N2581 Secondary hyperparathyroidism of renal origin: Secondary | ICD-10-CM | POA: Diagnosis not present

## 2017-07-09 DIAGNOSIS — R079 Chest pain, unspecified: Secondary | ICD-10-CM | POA: Diagnosis not present

## 2017-07-09 LAB — CBC
HCT: 33.1 % — ABNORMAL LOW (ref 36.0–46.0)
Hemoglobin: 10.7 g/dL — ABNORMAL LOW (ref 12.0–15.0)
MCH: 30.6 pg (ref 26.0–34.0)
MCHC: 32.3 g/dL (ref 30.0–36.0)
MCV: 94.6 fL (ref 78.0–100.0)
PLATELETS: 165 10*3/uL (ref 150–400)
RBC: 3.5 MIL/uL — AB (ref 3.87–5.11)
RDW: 16.8 % — AB (ref 11.5–15.5)
WBC: 3.4 10*3/uL — AB (ref 4.0–10.5)

## 2017-07-09 LAB — BASIC METABOLIC PANEL
Anion gap: 12 (ref 5–15)
BUN: 5 mg/dL — AB (ref 6–20)
CO2: 27 mmol/L (ref 22–32)
CREATININE: 2.45 mg/dL — AB (ref 0.44–1.00)
Calcium: 7.7 mg/dL — ABNORMAL LOW (ref 8.9–10.3)
Chloride: 96 mmol/L — ABNORMAL LOW (ref 101–111)
GFR, EST AFRICAN AMERICAN: 21 mL/min — AB (ref >60)
GFR, EST NON AFRICAN AMERICAN: 18 mL/min — AB (ref >60)
Glucose, Bld: 86 mg/dL (ref 65–99)
Potassium: 3.3 mmol/L — ABNORMAL LOW (ref 3.5–5.1)
SODIUM: 135 mmol/L (ref 135–145)

## 2017-07-09 LAB — ECHOCARDIOGRAM COMPLETE
Height: 62 in
Weight: 1992.96 oz

## 2017-07-09 LAB — TROPONIN I: Troponin I: <0.03 ng/mL (ref <0.03)

## 2017-07-09 LAB — MRSA PCR SCREENING: MRSA by PCR: NEGATIVE

## 2017-07-09 MED ORDER — LIDOCAINE HCL (PF) 1 % IJ SOLN: 5.0000 mL | INTRAMUSCULAR | Status: DC | PRN

## 2017-07-09 MED ORDER — HEPARIN SODIUM (PORCINE) 1000 UNIT/ML DIALYSIS
3000.0000 [IU] | Freq: Once | INTRAMUSCULAR | Status: AC
  Administered 2017-07-09: 3000 [IU] via INTRAVENOUS_CENTRAL

## 2017-07-09 MED ORDER — SODIUM CHLORIDE 0.9 % IV SOLN: 100.0000 mL | INTRAVENOUS | Status: DC | PRN

## 2017-07-09 MED ORDER — PENTAFLUOROPROP-TETRAFLUOROETH EX AERO
1.0000 "application " | INHALATION_SPRAY | CUTANEOUS | Status: DC | PRN
  Filled 2017-07-09: qty 30

## 2017-07-09 MED ORDER — OMEPRAZOLE 20 MG PO CPDR: 20.0000 mg | DELAYED_RELEASE_CAPSULE | Freq: Every day | ORAL | 0 refills | Status: DC

## 2017-07-09 MED ORDER — HEPARIN SODIUM (PORCINE) 1000 UNIT/ML DIALYSIS
1000.0000 [IU] | INTRAMUSCULAR | Status: DC | PRN
  Administered 2017-07-09: 1000 [IU] via INTRAVENOUS_CENTRAL

## 2017-07-09 MED ORDER — ALTEPLASE 2 MG IJ SOLR: 2.0000 mg | Freq: Once | INTRAMUSCULAR | Status: DC | PRN

## 2017-07-09 MED ORDER — LIDOCAINE-PRILOCAINE 2.5-2.5 % EX CREA
1.0000 "application " | TOPICAL_CREAM | CUTANEOUS | Status: DC | PRN
  Filled 2017-07-09: qty 5

## 2017-07-09 NOTE — Progress Notes (Signed)
   Subjective: Nancy [name] was seen resting in her bed this morning. She states that she is feeling well and denied any chest pain, nausea/vomiting, or abdominal pain.   Overnight the patient had 2 episodes of emesis after eating outside food brought by her daughter. The patient also stated that she had 10/10 chest pain that became stomach pain. The patient was given zofran.   Objective:  Vital signs in last 24 hours: Vitals:   07/09/17 0530 07/09/17 0545 07/09/17 0552 07/09/17 0615  BP: (!) 146/73 (!) 143/60 (!) 155/72 (!) 150/65  Pulse: 92 91 94 97  Resp: 14 13 14 15   Temp:   98.1 F (36.7 C) (!) 97.4 F (36.3 C)  TempSrc:   Oral Oral  SpO2: 99% 100% 100% 99%  Weight:   124 lb 9 oz (56.5 kg)   Height:       Physical Exam  Constitutional: She appears well-developed and well-nourished. No distress.  HENT:  Head: Normocephalic and atraumatic.  Eyes: Conjunctivae are normal.  Cardiovascular: Normal rate, regular rhythm and intact distal pulses.  Murmur heard. Respiratory: Effort normal and breath sounds normal. No respiratory distress. She has no wheezes.  GI: Soft. Bowel sounds are normal. She exhibits no distension. There is tenderness (epigastric on palpation).  Musculoskeletal: Normal range of motion.  Neurological: She is alert.  Skin: She is not diaphoretic.  Psychiatric: She has a normal mood and affect. Her behavior is normal. Judgment and thought content normal.    Assessment/Plan:  Chest pain The paient presented with pressure like chest pain that lasted 2 hours and accompanied sob, nausea, diaphoresis, and dizziness. The patient's troponin has been negative times 2. The patient's ekg does not show any st or t wave changes. The chest pain does not seem exertional, pleuritic, or positional.  During rounds the patient was noted to have st depression noted on telemetry and therefore repeat ekg was ordered which did not show any concern for ischemic changes.   The patient  has a heart score of 4 which places her at a 12-16.6% risk of a major cardiac event. The patient's history and testing do not show findings consistent with acs. However, the patient may benefit from stress testing outpatient. I suspect due to the patient's concomitant abdominal pain, that the patient's chest pain maybe gastrointestinal in etiology.   -continue GI cocktail -discharge patient on 30 day course of omeprazole 30mg  qd  Left lower quadrant pain The patient has a history of diverticulitis. -GI cocktail  HFpEF Per the patient's last echo done June 2018 showed EF=55-60%, G1DD, and no regional wall motion abnormalities. The patient put out -761 over the past 24hrs.  Echo (07/09/16) showed ef 65-70%, no regional wall motion abnormalities, severe concentric hypertrophy.   ESRD Patient got dialyzed yesterday 07/08/17  Dispo: Anticipated discharge today.   Lars Mage, MD Internal Medicine PGY1 [phone] 07/09/2017, 7:21 AM

## 2017-07-09 NOTE — Progress Notes (Signed)
HD tx completed w/o problem other than one episode of cramping that was resolved by UF off for a bit, UF goal still met, blood rinsed back, VSS, report called to Neysa Hotter, RN

## 2017-07-09 NOTE — Progress Notes (Signed)
HD tx initiated via HD cath w/o problem, pull/push/flush equally w/o problem, VSS, will cont to monitor while on HD tx 

## 2017-07-09 NOTE — Progress Notes (Signed)
Patient ID: Nancy [name], female   DOB: [birth year], 77 y.o.   MRN: [id number]  [facility] Progress Note    Subjective:   Feels better but had 2 episodes of emesis   Objective:   BP 140/63 (BP Location: Right Arm)   Pulse 96   Temp 98 F (36.7 C) (Oral)   Resp 14   Ht 5\' 2"  (1.575 m)   Wt 56.5 kg (124 lb 9 oz)   SpO2 100%   BMI 22.78 kg/m   Intake/Output: I/O last 3 completed shifts: In: 240 [P.O.:240] Out: 1001 [Other:1000; Stool:1]   Intake/Output this shift:  No intake/output data recorded. Weight change:   Physical Exam: Gen: NAD CVS: no rub, III/VI SEM Resp:cta Abd: benign Ext: no edema, LUE AVF +T/B, rij tdc in place  Labs: BMET Recent Labs  Lab 07/08/17 1212 07/08/17 1454 07/08/17 2137 07/09/17 0726  NA 130*  --  130* 135  K 5.7*  --  4.5 3.3*  CL 95*  --  92* 96*  CO2 21*  --  22 27  GLUCOSE 82  --  83 86  BUN 32*  --  38* 5*  CREATININE 6.34*  --  7.00* 2.45*  ALBUMIN  --  3.6 3.3*  --   CALCIUM 9.2  --  8.9 7.7*  PHOS  --   --  6.6*  --    CBC Recent Labs  Lab 07/08/17 1212 07/09/17 0726  WBC 4.4 3.4*  NEUTROABS 3.4  --   HGB 11.7* 10.7*  HCT 36.3 33.1*  MCV 96.0 94.6  PLT 170 165    @IMGRELPRIORS @ Medications:    . aspirin EC  81 mg Oral Daily  . calcitRIOL  1.75 mcg Oral Q M,W,F  . calcium acetate  2,001 mg Oral TID WC  . cinacalcet  60 mg Oral BID WC  . heparin  5,000 Units Subcutaneous Q8H  . multivitamin  1 tablet Oral QHS  . sevelamer carbonate  800 mg Oral TID WC   Dialysis Orders: [facility] M[facility] 4 hrs 180NRe 300/800 56.5 kg 3.0K/2.5 Ca   -Heparin 3000 units IV TIW -Calcitriol 1.75 mcg IV TIW -Venofer 50 mg IV q weekly (has not been given-recent Fe load 100 mg venofer X 10 for Tsat 26%!) -Mircera 50 mcg IV q 2 weeks (last dose 07/02/17 Last HGB 10.4 07/02/17)  BMD meds:  -Renvela 800 mg PO TID AC -Calcium acetate 667 mg 3 caps TID AC -Sensipar 60 mg PO BID    Assessment/ Plan:   1. Chest pain-  resolved but also had 2 episodes of emesis.  Pain was more epigastric so possible GI source.   2. ESRD s/p HD yesterday and cont with schedule as an outpatient on Friday if discharged before then. 3. Anemia: ESA and iron 4. Hyperkalemia- resolved and likely due to using 3 K bath and low bfr due to poorly functioning avf. 5. CKD-MBD: cont with meds 6. Nutrition: renal diet 7. Hypertension: stable 8. Vascular access- some issues with cannulation and poor maturation.  Will refer for fistulogram as an outpatient if leaving later today. 9. Disposition- per primary svc.  Donetta Potts, MD [place] Pager [phone] 07/09/2017, 11:34 AM

## 2017-07-09 NOTE — Progress Notes (Signed)
  Echocardiogram 2D Echocardiogram has been performed.  Nancy [name] 07/09/2017, 9:03 AM

## 2017-07-09 NOTE — Consult Note (Signed)
   [facility] Inpatient Consult   07/09/2017  [place] B Lucero 12-02-1940 [id number]  Patient was assessed for [facility] for community services in the [facility] ACO Patient was previously active with [facility].  Met with patient at bedside regarding being restarted with [facility] services. Active consent form on file, a brochure and 24 hour nurse line magnet with [facility] information was given.  Patient endorses Dr. Gilles Chiquito as her primary care provider.  Patient goes to dialysis on [address] on Monday, Wednesday, and Friday. She uses [employer] for transportation.  Patient states her husband died in 04-27-2023 and now lives alone but her daughter checks on her and she is able to provide her own food. Of note, [facility] Care Management services does not replace or interfere with any services that are arranged by inpatient case management or social work. For additional questions or referrals please contact:  [name], RN BSN [facility] Liaison  [phone] business mobile phone Toll free office [phone]

## 2017-07-09 NOTE — Discharge Summary (Signed)
Name: Nancy [name] MRN: [id number] DOB: 12-May-1941 77 y.o. PCP: Sid Falcon, MD  Date of Admission: 07/08/2017 11:20 AM Date of Discharge: 07/09/17 Attending Physician: Oda Kilts, MD  Discharge Diagnosis:  Atypical chest pain  Discharge Medications: Allergies as of 07/09/2017      Reactions   Hydrocodone-acetaminophen Nausea And Vomiting, Other (See Comments)   Dizziness (also)      Medication List    STOP taking these medications   cloNIDine 0.2 mg/24hr patch Commonly known as:  CATAPRES - Dosed in mg/24 hr Replaced by:  cloNIDine 0.2 mg/24hr patch     TAKE these medications   acetaminophen 500 MG tablet Commonly known as:  TYLENOL Take 1,000 mg by mouth every 8 (eight) hours as needed for mild pain, moderate pain, fever or headache.   amLODipine 10 MG tablet Commonly known as:  NORVASC Take 1 tablet (10 mg total) by mouth daily.   aspirin 81 MG EC tablet Take 1 tablet (81 mg total) by mouth daily. What changed:  how much to take   calcitRIOL 0.25 MCG capsule Commonly known as:  ROCALTROL Take 1 capsule (0.25 mcg total) by mouth daily.   calcium acetate 667 MG capsule Commonly known as:  PHOSLO Take 2 capsules (1,334 mg total) by mouth 3 (three) times daily with meals.   carvedilol 12.5 MG tablet Commonly known as:  COREG Take 12.5 mg by mouth 2 (two) times daily.   cloNIDine 0.2 mg/24hr patch Commonly known as:  CATAPRES - Dosed in mg/24 hr Place 1 patch (0.2 mg total) onto the skin every 7 (seven) days. Replaces:  cloNIDine 0.2 mg/24hr patch   nitroGLYCERIN 0.3 MG SL tablet Commonly known as:  NITROSTAT Place 1 tablet (0.3 mg total) under the tongue every 5 (five) minutes as needed for chest pain.   SENSIPAR 60 MG tablet Generic drug:  cinacalcet Take 1 tablet (60 mg total) by mouth daily.   sevelamer carbonate 800 MG tablet Commonly known as:  RENVELA Take 800 mg by mouth 3 (three) times daily.   traMADol 50 MG tablet Commonly  known as:  ULTRAM Take 1 tablet (50 mg total) by mouth every 6 (six) hours as needed for severe pain.   traMADol 50 MG tablet Commonly known as:  ULTRAM Take 1 tablet (50 mg total) by mouth every 6 (six) hours as needed for severe pain.       Disposition and follow-up:   Nancy [name] was discharged from [facility] in good condition.  At the hospital follow up visit please address:  1.  Atypical chest pain: please ensure that the patient does not have continued chest pain and whether omeprazole has given her relief. Please consider ordering ekg if necessary.   2.  Labs / imaging needed at time of follow-up: none  3.  Pending labs/ test needing follow-up: none  Follow-up Appointments:   Hospital Course by problem list:  Atypical Chest Pain Ms. Messing presented with 2 hour history of chest pain accompanied with sob, diaphoresis, dizziness, nausea, vomiting, and epigastric abdominal pain. The patient reported that the chest pain was alleviated with aspirin and nitroglycerin. The chest pain was not exertional, pleuritic, or positional. EKG did not show any st or t wave changes and negative troponin x3 was present. BNP was  Echo was done which showed ef 65-70%, no regional wall motion abnormalities, severe concentric hypertrophy.   The patient had a heart score of 4 based on her  risk factors. The patient had concomitant abdominal pain which caused concern for the chest pain to have been gastrointestinal in etiology. The patient was given gi cocktail and discharged on omeprazole.   ESRD The patient was dialyzed during hospitalization with continued dialysis planned outpatient.   Discharge Vitals:   BP 140/63 (BP Location: Right Arm)   Pulse 96   Temp 98 F (36.7 C) (Oral)   Resp 14   Ht 5\' 2"  (1.575 m)   Wt 124 lb 9 oz (56.5 kg)   SpO2 100%   BMI 22.78 kg/m   Pertinent Labs, Studies, and Procedures:   EKG (07/08/17): No ST or t wave changes noted   CXR  (07/08/17): no acute abnormalities  troponins negx3  CBC    Component Value Date/Time   WBC 3.4 (L) 07/09/2017 0726   RBC 3.50 (L) 07/09/2017 0726   HGB 10.7 (L) 07/09/2017 0726   HGB 8.3 (L) 01/15/2017 1055   HCT 33.1 (L) 07/09/2017 0726   HCT 26.5 (L) 01/15/2017 1055   PLT 165 07/09/2017 0726   PLT 160 01/15/2017 1055   MCV 94.6 07/09/2017 0726   MCV 86 01/15/2017 1055   MCH 30.6 07/09/2017 0726   MCHC 32.3 07/09/2017 0726   RDW 16.8 (H) 07/09/2017 0726   RDW 17.6 (H) 01/15/2017 1055   LYMPHSABS 0.7 07/08/2017 1212   MONOABS 0.3 07/08/2017 1212   EOSABS 0.0 07/08/2017 1212   BASOSABS 0.0 07/08/2017 1212    BMP Latest Ref Rng & Units 07/09/2017 07/08/2017 07/08/2017  Glucose 65 - 99 mg/dL 86 83 82  BUN 6 - 20 mg/dL 5(L) 38(H) 32(H)  Creatinine 0.44 - 1.00 mg/dL 2.45(H) 7.00(H) 6.34(H)  BUN/Creat Ratio 12 - 28 - - -  Sodium 135 - 145 mmol/L 135 130(L) 130(L)  Potassium 3.5 - 5.1 mmol/L 3.3(L) 4.5 5.7(H)  Chloride 101 - 111 mmol/L 96(L) 92(L) 95(L)  CO2 22 - 32 mmol/L 27 22 21(L)  Calcium 8.9 - 10.3 mg/dL 7.7(L) 8.9 9.2    Hep B surface antigen=neg   Discharge Instructions: Discharge Instructions    (HEART FAILURE PATIENTS) Call MD:  Anytime you have any of the following symptoms: 1) 3 pound weight gain in 24 hours or 5 pounds in 1 week 2) shortness of breath, with or without a dry hacking cough 3) swelling in the hands, feet or stomach 4) if you have to sleep on extra pillows at night in order to breathe.   Complete by:  As directed    Call MD for:  extreme fatigue   Complete by:  As directed    Call MD for:  persistant nausea and vomiting   Complete by:  As directed    Diet - low sodium heart healthy   Complete by:  As directed    Increase activity slowly   Complete by:  As directed       Signed: Lars Mage, MD 07/09/2017, 1:17 PM   Pager: [phone]

## 2017-07-09 NOTE — Plan of Care (Signed)
Pt going home

## 2017-07-09 NOTE — Discharge Instructions (Signed)
It was a pleasure to take care of you for your chest pain [name]. Your tests came back normal and did not show that you had a heart attack. Please continue to take all your medication as prescribed. Please call 911 immediately if you experience chest pain again.    Chest Wall Pain Chest wall pain is pain in or around the bones and muscles of your chest. Sometimes, an injury causes this pain. Sometimes, the cause may not be known. This pain may take several weeks or longer to get better. Follow these instructions at home: Pay attention to any changes in your symptoms. Take these actions to help with your pain:  Rest as told by your doctor.  Avoid activities that cause pain. Try not to use your chest, belly (abdominal), or side muscles to lift heavy things.  If directed, apply ice to the painful area: ? Put ice in a plastic bag. ? Place a towel between your skin and the bag. ? Leave the ice on for 20 minutes, 2-3 times per day.  Take over-the-counter and prescription medicines only as told by your doctor.  Do not use tobacco products, including cigarettes, chewing tobacco, and e-cigarettes. If you need help quitting, ask your doctor.  Keep all follow-up visits as told by your doctor. This is important.  Contact a doctor if:  You have a fever.  Your chest pain gets worse.  You have new symptoms. Get help right away if:  You feel sick to your stomach (nauseous) or you throw up (vomit).  You feel sweaty or light-headed.  You have a cough with phlegm (sputum) or you cough up blood.  You are short of breath. This information is not intended to replace advice given to you by your health care provider. Make sure you discuss any questions you have with your health care provider. Document Released: 12/11/2007 Document Revised: 11/30/2015 Document Reviewed: 09/19/2014 Elsevier Interactive Patient Education  [employer].

## 2017-07-09 NOTE — Progress Notes (Signed)
Pt discharged to home with daugther via w/c with belongings and cane., prescriptions sent electronicly to pharmacy. TRN case manager will follow up with pt once home.

## 2017-07-09 NOTE — Plan of Care (Signed)
Continue current care plan 

## 2017-07-09 NOTE — Progress Notes (Signed)
Pt has emesis eps x2, ~3 hours after eating outside food that her daughter brought. On call MD notified. Received order for Zofran disintegrating tab. Pt afebrile, ST. Stated "I just don't feel good". Initially cp of 10/10 chest pain but now pt cp of 10/10 stomach pain.  Called HD to see what time she's scheduled for dialysis today. Was told she's on the schedule but they don't know what time they will come get her. Will continue to monitor pt.

## 2017-07-09 NOTE — Progress Notes (Signed)
Pt back from HD tx. Denies chest pain, and stomach pain. VSS, alert and oriented. Will continued to monitor pt.

## 2017-07-10 ENCOUNTER — Other Ambulatory Visit: Payer: Self-pay | Admitting: *Deleted

## 2017-07-10 LAB — HEPATITIS B SURFACE ANTIGEN: Hepatitis B Surface Ag: NEGATIVE

## 2017-07-10 NOTE — Patient Outreach (Signed)
[facility]) Care Management  07/10/2017  [place] B Lariccia 08-Jun-1941 [id number]    COVERING [address] Initial Transition of Care (D/C 1/2)  Initial outreach call today unsuccessful and RN unable to leave a voice message. Will report to continue outreach attempts on this recent discharge.  Raina Mina, RN Care Management Coordinator [place] Office [phone]

## 2017-07-11 ENCOUNTER — Other Ambulatory Visit: Payer: Self-pay | Admitting: *Deleted

## 2017-07-11 DIAGNOSIS — D509 Iron deficiency anemia, unspecified: Secondary | ICD-10-CM | POA: Diagnosis not present

## 2017-07-11 DIAGNOSIS — N2581 Secondary hyperparathyroidism of renal origin: Secondary | ICD-10-CM | POA: Diagnosis not present

## 2017-07-11 DIAGNOSIS — R51 Headache: Secondary | ICD-10-CM | POA: Diagnosis not present

## 2017-07-11 DIAGNOSIS — D689 Coagulation defect, unspecified: Secondary | ICD-10-CM | POA: Diagnosis not present

## 2017-07-11 DIAGNOSIS — N186 End stage renal disease: Secondary | ICD-10-CM | POA: Diagnosis not present

## 2017-07-11 DIAGNOSIS — D631 Anemia in chronic kidney disease: Secondary | ICD-10-CM | POA: Diagnosis not present

## 2017-07-11 NOTE — Patient Outreach (Signed)
[facility]) Care Management   07/11/2017   Nancy [name]  08/24/1940 [id number]  Unsuccessful telephone encounter to Nancy Caban, 77 year old female for transition of care/recent hospitalization January 1-2,2019 for chest pain  as this RN CM covering for pt's primary RN CM.   Unable to leave a voice message as voice message.  View in EMR [facility] hospital liaison's note pt goes to dialysis Monday,  Wednesday, Friday.     Plan:  RN CM to report to pt's primary RN CM Nancy [name] unable to contact.    Nancy [name].   [facility]  [phone]

## 2017-07-14 DIAGNOSIS — D689 Coagulation defect, unspecified: Secondary | ICD-10-CM | POA: Diagnosis not present

## 2017-07-14 DIAGNOSIS — R51 Headache: Secondary | ICD-10-CM | POA: Diagnosis not present

## 2017-07-14 DIAGNOSIS — N186 End stage renal disease: Secondary | ICD-10-CM | POA: Diagnosis not present

## 2017-07-14 DIAGNOSIS — D509 Iron deficiency anemia, unspecified: Secondary | ICD-10-CM | POA: Diagnosis not present

## 2017-07-14 DIAGNOSIS — N2581 Secondary hyperparathyroidism of renal origin: Secondary | ICD-10-CM | POA: Diagnosis not present

## 2017-07-14 DIAGNOSIS — D631 Anemia in chronic kidney disease: Secondary | ICD-10-CM | POA: Diagnosis not present

## 2017-07-15 ENCOUNTER — Other Ambulatory Visit: Payer: Self-pay | Admitting: *Deleted

## 2017-07-15 NOTE — Patient Outreach (Signed)
[facility]) Care Management  07/15/2017  Nancy [name] 06/16/1941 [id number]   3rd attempt made to contact member since discharge, this time successful.  She was recently admitted to hospital for observation on 1/1 for chest pain, discharged on 1/2.  Per chart, she also has history of hypertension, end stage renal disease, GERD, and hyperlipidemia.  This care manager familiar with member as she was involved with [facility] in the past.  She refused dialysis in the past, currently on treatment Monday/Wednesday/Friday.    This care manager introduced self, [facility] care management services again explained.  Member report she is doing well, denies chest pain at this time.  Attempted to review medications, but as with previous involvement, she does not take medications.  State they are still making her sick, only using clonidine patch.  She has follow up appointment next week with primary MD, state she think she will be able to secure transportation, advised to notify this care manager for assistance if needed.  Report she uses [employer] for transportation to dialysis.    Denies any urgent concerns.  Agrees to home visit within the next 2 weeks.    [facility] CM Care Plan Problem One     Most Recent Value  Care Plan Problem One  Risk for increased ED use or admission related to complications of ESRD as evidenced by recent observation admit  Role Documenting the Problem One  Care Management [place] for Problem One  Active  [facility] Long Term Goal   Member will not have any ED visis or hospitalizations within the next 31 days  [facility] Long Term Goal Start Date  07/15/17  Interventions for Problem One Long Term Goal  Discussed with member the importance of following discharge instructions, including follow up appointments, medications, diet, to decrease the risk of readmission  [facility] CM Short Term Goal #1   Member will keep and attend appointment with pirmary MD within the next 2 weeks  [facility] CM  Short Term Goal #1 Start Date  07/15/17  Interventions for Short Term Goal #1  Next appointments reviewed with member (next week).  Advised to schedule transportation, if unable to contact this care manager for assistance  [facility] CM Short Term Goal #2   Member will be compliant with dialysis treatments weekly within the nex 4 weeks  Interventions for Short Term Goal #2  Member educated on importance of compliance with treatment, educated on complications if not compliant.  Confirmed member has transportation to treatments.     [name], [place], MSN [place] [phone]

## 2017-07-16 DIAGNOSIS — D509 Iron deficiency anemia, unspecified: Secondary | ICD-10-CM | POA: Diagnosis not present

## 2017-07-16 DIAGNOSIS — R51 Headache: Secondary | ICD-10-CM | POA: Diagnosis not present

## 2017-07-16 DIAGNOSIS — N186 End stage renal disease: Secondary | ICD-10-CM | POA: Diagnosis not present

## 2017-07-16 DIAGNOSIS — D689 Coagulation defect, unspecified: Secondary | ICD-10-CM | POA: Diagnosis not present

## 2017-07-16 DIAGNOSIS — D631 Anemia in chronic kidney disease: Secondary | ICD-10-CM | POA: Diagnosis not present

## 2017-07-16 DIAGNOSIS — N2581 Secondary hyperparathyroidism of renal origin: Secondary | ICD-10-CM | POA: Diagnosis not present

## 2017-07-18 DIAGNOSIS — D509 Iron deficiency anemia, unspecified: Secondary | ICD-10-CM | POA: Diagnosis not present

## 2017-07-18 DIAGNOSIS — R51 Headache: Secondary | ICD-10-CM | POA: Diagnosis not present

## 2017-07-18 DIAGNOSIS — N2581 Secondary hyperparathyroidism of renal origin: Secondary | ICD-10-CM | POA: Diagnosis not present

## 2017-07-18 DIAGNOSIS — D689 Coagulation defect, unspecified: Secondary | ICD-10-CM | POA: Diagnosis not present

## 2017-07-18 DIAGNOSIS — D631 Anemia in chronic kidney disease: Secondary | ICD-10-CM | POA: Diagnosis not present

## 2017-07-18 DIAGNOSIS — N186 End stage renal disease: Secondary | ICD-10-CM | POA: Diagnosis not present

## 2017-07-21 DIAGNOSIS — N2581 Secondary hyperparathyroidism of renal origin: Secondary | ICD-10-CM | POA: Diagnosis not present

## 2017-07-21 DIAGNOSIS — D631 Anemia in chronic kidney disease: Secondary | ICD-10-CM | POA: Diagnosis not present

## 2017-07-21 DIAGNOSIS — N186 End stage renal disease: Secondary | ICD-10-CM | POA: Diagnosis not present

## 2017-07-21 DIAGNOSIS — D509 Iron deficiency anemia, unspecified: Secondary | ICD-10-CM | POA: Diagnosis not present

## 2017-07-21 DIAGNOSIS — D689 Coagulation defect, unspecified: Secondary | ICD-10-CM | POA: Diagnosis not present

## 2017-07-21 DIAGNOSIS — R51 Headache: Secondary | ICD-10-CM | POA: Diagnosis not present

## 2017-07-22 ENCOUNTER — Other Ambulatory Visit: Payer: Self-pay

## 2017-07-22 ENCOUNTER — Ambulatory Visit (INDEPENDENT_AMBULATORY_CARE_PROVIDER_SITE_OTHER): Payer: Medicare Other | Admitting: Internal Medicine

## 2017-07-22 VITALS — BP 108/45 | HR 72 | Temp 97.5°F | Ht 62.0 in | Wt 128.3 lb

## 2017-07-22 DIAGNOSIS — I132 Hypertensive heart and chronic kidney disease with heart failure and with stage 5 chronic kidney disease, or end stage renal disease: Secondary | ICD-10-CM | POA: Diagnosis not present

## 2017-07-22 DIAGNOSIS — R200 Anesthesia of skin: Secondary | ICD-10-CM | POA: Diagnosis not present

## 2017-07-22 DIAGNOSIS — Z992 Dependence on renal dialysis: Secondary | ICD-10-CM

## 2017-07-22 DIAGNOSIS — M79602 Pain in left arm: Secondary | ICD-10-CM | POA: Insufficient documentation

## 2017-07-22 DIAGNOSIS — R2232 Localized swelling, mass and lump, left upper limb: Secondary | ICD-10-CM

## 2017-07-22 DIAGNOSIS — R011 Cardiac murmur, unspecified: Secondary | ICD-10-CM

## 2017-07-22 DIAGNOSIS — I509 Heart failure, unspecified: Secondary | ICD-10-CM

## 2017-07-22 DIAGNOSIS — N186 End stage renal disease: Secondary | ICD-10-CM

## 2017-07-22 MED ORDER — ACETAMINOPHEN 500 MG PO TABS: 1000.0000 mg | ORAL_TABLET | Freq: Three times a day (TID) | ORAL | 0 refills | Status: DC | PRN

## 2017-07-22 NOTE — Progress Notes (Signed)
Internal Medicine Clinic Attending  I saw and evaluated the patient.  I personally confirmed the key portions of the history and exam documented by Dr. Huang and I reviewed pertinent patient test results.  The assessment, diagnosis, and plan were formulated together and I agree with the documentation in the resident's note.  

## 2017-07-22 NOTE — Assessment & Plan Note (Addendum)
Assessment Since 1/11, she notes throbbing left arm pain. The pain did keep her awake last night. She endorses occasional numbness to her left forearm and hand. She denies fevers or chills. She tried Tylenol with no relief. She did just have left AV fistula placed and he started using it 3 weeks ago for dialysis. She denies trauma to the area. She also notes that there is a new mass in her left upper arm that just appeared on the 11th.  She showed this mass to the physician at the dialysis center last week, who stated that her catheter needed to be flushed in order to get the blood clots out. She states that it was never flushed. On exam, there is a 4 x 4 centimeter mobile fluctuant mass approximately 10 cm above her left AVF. Her left upper extremity is neurovascularly intact.  I discussed with the patient that I am not sure what this mass is, however her dialysis physician may have a better idea during that she just started dialysis in the left arm 3 weeks ago and it may be related to this. I advised the patient that if this worsens, we are happy to see her back in clinic and we can consider further imaging workup at that time.  Plan - Follow up with nephrology regarding left arm nodule in the setting of new dialysis catheter. - Tylenol 1000 mg every 8 hours as needed for pain - Return to clinic for further workup (can consider ultrasound) if no improvement

## 2017-07-22 NOTE — Patient Instructions (Addendum)
FOLLOW-UP INSTRUCTIONS When: 1 month with PCP For: BP management and health maintenance What to bring: medications   Nancy [name],  It was a pleasure to see you today. For your left arm pain, I am sending a prescription of a stronger Tylenol medication to help with the pain. Please take 1000mg  of Tylenol every 8 hours as needed for the pain for up to 5 days. Please also bring this up with your dialysis doctors. If they are not able to help, please return to the clinic so we can do further tests here.

## 2017-07-22 NOTE — Progress Notes (Signed)
   CC: hospital follow up and left arm pain  HPI:  [name] is a 77 y.o. female with PMH of CHF, ESRD, and hypertension who presents for hospital follow-up and left arm pain.  She was recently admitted to the hospital 1/1-1/2 for atypical chest pain. ACS workup was negative. Echo showed LVEF 65-70% and severe concentric hypertrophy, no regional wall motion abnormalities. Her chest pain was thought to be gastrointestinal in etiology.  Since discharge, she endorses continued chest pain that is right-sided. It is sharp, lasts for approximately an hour before relieving on its own, radiates to bilateral arms and back, and is associated with nausea and shortness of breath. She states the chest pain only occurs after dialysis. It does not occur with exertion or at rest other times.   She also notes throbbing left arm pain since 1/11. Pain kept her awake last night. She endorses occasional numbness to her left forearm and hand. She denies fevers. She tried Tylenol with no relief. She did just have left AV fistula placed and they started using it 3 weeks ago for dialysis.  Past Medical History:  Diagnosis Date  . Anemia   . Aortic stenosis   . Bacterial sinusitis 09/17/2011  . CHF (congestive heart failure) ([place])   . CKD (chronic kidney disease) stage 4, GFR 15-29 ml/min ([place]) 08/11/2006   Cr continues to increase. Proteinuria on UA 02/10/12.    . Colitis   . CVA (cerebrovascular accident) [facility])    New hemorrhagic per CT scan '09  . Diverticulosis of colon   . Dysfunctional uterine bleeding   . Fecal impaction ([place])   . Headache(784.0)   . Heart murmur   . HERNIORRHAPHY, HX OF 08/11/2006  . Hypertension   . OA (osteoarthritis)    bilateral knees  . Postmenopausal   . Pulmonary nodule   . TINEA CRURIS 01/12/2007   Review of Systems:   GEN: No fevers/chills. CV: Positive for right-sided sharp chest pain that only occurs after dialysis. PULM: Positive for shortness of breath only  associated with the chest pain. Denies cough. MSK: Positive for throbbing left arm pain and intermittent numbness to forearm and hand.  Physical Exam:  Vitals:   07/22/17 1025  BP: (!) 108/45  Pulse: 72  Temp: (!) 97.5 F (36.4 C)  TempSrc: Oral  SpO2: 100%  Weight: 128 lb 4.8 oz (58.2 kg)  Height: 5\' 2"  (1.575 m)   GEN: Sitting in chair in NAD. CV: NR & RR. 2/6 systolic murmur. TTP of anterior chest wall. PULM: CTAB; no wheezes or rales MSK: Left upper arm fistula with palpable thrill. 4x4cm mobile fluctuant nodule approximately 10cm above fistula site. No overlying erythema or edema. Mildly tender to palpation. 2+ radial pulse in left arm. Normal sensation in left upper arm. Capillary refill <2 sec.  Assessment & Plan:   See Encounters Tab for problem based charting.  Patient seen with Dr. Ellison Carwin, MD Internal Medicine, PGY-1

## 2017-07-22 NOTE — Assessment & Plan Note (Addendum)
Assessment On hemodialysis Monday Wednesday Friday. She was previously using a chest dialysis catheter, which is now been switched over to a left AVF for access. She was recently admitted for atypical chest pain. ACS workup was negative. Her chest pain was felt to be most likely GI in etiology. She was discharged on omeprazole.  Since discharge, she endorses sharp right-sided chest pain that lasts for approximately an hour after dialysis. It resolves without any intervention. She endorses associated nausea, shortness of breath, radiation to her bilateral arms. I'm not sure of the etiology of her chest pain, but I do not think it is cardiac in nature. She thinks that it might be due to them pulling off too much fluid during dialysis.  Plan - Continue dialysis Monday Wednesday Friday. - Patient agrees to discuss with her dialysis doctor at her appointment next week - Return to clinic in 1 month for follow-up with PCP

## 2017-07-23 DIAGNOSIS — N186 End stage renal disease: Secondary | ICD-10-CM | POA: Diagnosis not present

## 2017-07-23 DIAGNOSIS — N2581 Secondary hyperparathyroidism of renal origin: Secondary | ICD-10-CM | POA: Diagnosis not present

## 2017-07-23 DIAGNOSIS — D631 Anemia in chronic kidney disease: Secondary | ICD-10-CM | POA: Diagnosis not present

## 2017-07-23 DIAGNOSIS — D509 Iron deficiency anemia, unspecified: Secondary | ICD-10-CM | POA: Diagnosis not present

## 2017-07-23 DIAGNOSIS — R51 Headache: Secondary | ICD-10-CM | POA: Diagnosis not present

## 2017-07-23 DIAGNOSIS — D689 Coagulation defect, unspecified: Secondary | ICD-10-CM | POA: Diagnosis not present

## 2017-07-25 DIAGNOSIS — D689 Coagulation defect, unspecified: Secondary | ICD-10-CM | POA: Diagnosis not present

## 2017-07-25 DIAGNOSIS — D631 Anemia in chronic kidney disease: Secondary | ICD-10-CM | POA: Diagnosis not present

## 2017-07-25 DIAGNOSIS — N2581 Secondary hyperparathyroidism of renal origin: Secondary | ICD-10-CM | POA: Diagnosis not present

## 2017-07-25 DIAGNOSIS — D509 Iron deficiency anemia, unspecified: Secondary | ICD-10-CM | POA: Diagnosis not present

## 2017-07-25 DIAGNOSIS — N186 End stage renal disease: Secondary | ICD-10-CM | POA: Diagnosis not present

## 2017-07-25 DIAGNOSIS — R51 Headache: Secondary | ICD-10-CM | POA: Diagnosis not present

## 2017-07-28 DIAGNOSIS — D509 Iron deficiency anemia, unspecified: Secondary | ICD-10-CM | POA: Diagnosis not present

## 2017-07-28 DIAGNOSIS — N186 End stage renal disease: Secondary | ICD-10-CM | POA: Diagnosis not present

## 2017-07-28 DIAGNOSIS — D631 Anemia in chronic kidney disease: Secondary | ICD-10-CM | POA: Diagnosis not present

## 2017-07-28 DIAGNOSIS — N2581 Secondary hyperparathyroidism of renal origin: Secondary | ICD-10-CM | POA: Diagnosis not present

## 2017-07-28 DIAGNOSIS — R51 Headache: Secondary | ICD-10-CM | POA: Diagnosis not present

## 2017-07-28 DIAGNOSIS — D689 Coagulation defect, unspecified: Secondary | ICD-10-CM | POA: Diagnosis not present

## 2017-07-29 DIAGNOSIS — Z992 Dependence on renal dialysis: Secondary | ICD-10-CM | POA: Diagnosis not present

## 2017-07-29 DIAGNOSIS — N186 End stage renal disease: Secondary | ICD-10-CM | POA: Diagnosis not present

## 2017-07-29 DIAGNOSIS — I871 Compression of vein: Secondary | ICD-10-CM | POA: Diagnosis not present

## 2017-07-30 DIAGNOSIS — D509 Iron deficiency anemia, unspecified: Secondary | ICD-10-CM | POA: Diagnosis not present

## 2017-07-30 DIAGNOSIS — R51 Headache: Secondary | ICD-10-CM | POA: Diagnosis not present

## 2017-07-30 DIAGNOSIS — N186 End stage renal disease: Secondary | ICD-10-CM | POA: Diagnosis not present

## 2017-07-30 DIAGNOSIS — D631 Anemia in chronic kidney disease: Secondary | ICD-10-CM | POA: Diagnosis not present

## 2017-07-30 DIAGNOSIS — N2581 Secondary hyperparathyroidism of renal origin: Secondary | ICD-10-CM | POA: Diagnosis not present

## 2017-07-30 DIAGNOSIS — D689 Coagulation defect, unspecified: Secondary | ICD-10-CM | POA: Diagnosis not present

## 2017-07-31 ENCOUNTER — Other Ambulatory Visit: Payer: Self-pay | Admitting: Licensed Clinical Social Worker

## 2017-07-31 ENCOUNTER — Encounter: Payer: Self-pay | Admitting: *Deleted

## 2017-07-31 ENCOUNTER — Other Ambulatory Visit: Payer: Self-pay | Admitting: *Deleted

## 2017-07-31 NOTE — Patient Outreach (Addendum)
[facility]) Care Management   07/31/2017  Nancy [name] 02-Dec-1940 [id number]  Nancy [name] is an 77 y.o. female  Subjective:   Member alert and oriented x3, denies chest pain or discomfort at this time.  Denies compliance with medications with the exception of Clonidine patch stating all other medications make her sick to her stomach.  Report she receives some medications while at the dialysis center, but unsure which ones she is receiving.  As she did last year while [facility] was involved, she continues to complain of poor vision, unable to read medication labels.    Objective:   Review of Systems  Constitutional: Negative.   HENT: Negative.   Eyes: Positive for blurred vision.  Respiratory: Negative.   Cardiovascular: Negative.   Gastrointestinal: Negative.   Genitourinary: Negative.   Musculoskeletal: Negative.   Skin: Negative.   Neurological: Negative.   Endo/Heme/Allergies: Negative.   Psychiatric/Behavioral: Negative.     Physical Exam  Constitutional: She is oriented to person, place, and time. She appears well-developed and well-nourished.  Neck: Normal range of motion.  Cardiovascular: Normal rate, regular rhythm and normal heart sounds.  Respiratory: Effort normal and breath sounds normal.  GI: Soft. Bowel sounds are normal.  Musculoskeletal: Normal range of motion.  Neurological: She is alert and oriented to person, place, and time.  Skin: Skin is warm and dry.   BP (!) 116/58 (BP Location: Right Arm, Patient Position: Sitting, Cuff Size: Normal)   Pulse 81   Resp 18   Ht 1.575 m (_0 )   Wt 126 lb (57.2 kg)   SpO2 98%   BMI 23.05 kg/m   Encounter Medications:   Outpatient Encounter Medications as of 07/31/2017  Medication Sig Note  . acetaminophen (TYLENOL) 500 MG tablet Take 2 tablets (1,000 mg total) by mouth every 8 (eight) hours as needed for mild pain, moderate pain, fever or headache.   . cloNIDine (CATAPRES - DOSED IN MG/24 HR)  0.2 mg/24hr patch Place 1 patch (0.2 mg total) onto the skin every 7 (seven) days.   Marland Kitchen amLODipine (NORVASC) 10 MG tablet Take 1 tablet (10 mg total) by mouth daily. (Patient not taking: Reported on 07/17/2017)   . aspirin EC 81 MG EC tablet Take 1 tablet (81 mg total) by mouth daily. (Patient not taking: Reported on 07/17/2017)   . calcitRIOL (ROCALTROL) 0.25 MCG capsule Take 1 capsule (0.25 mcg total) by mouth daily. (Patient not taking: Reported on 07/17/2017)   . calcium acetate (PHOSLO) 667 MG capsule Take 2 capsules (1,334 mg total) by mouth 3 (three) times daily with meals. (Patient not taking: Reported on 07/17/2017)   . carvedilol (COREG) 12.5 MG tablet Take 12.5 mg by mouth 2 (two) times daily. 07/08/2017: Patien sadi she stopped most medication b/c of nausea  . nitroGLYCERIN (NITROSTAT) 0.3 MG SL tablet Place 1 tablet (0.3 mg total) under the tongue every 5 (five) minutes as needed for chest pain. (Patient not taking: Reported on 07/17/2017)   . omeprazole (PRILOSEC) 20 MG capsule Take 1 capsule (20 mg total) by mouth daily. (Patient not taking: Reported on 07/17/2017)   . SENSIPAR 60 MG tablet Take 1 tablet (60 mg total) by mouth daily. (Patient not taking: Reported on 07/17/2017)   . sevelamer carbonate (RENVELA) 800 MG tablet Take 800 mg by mouth 3 (three) times daily.   . traMADol (ULTRAM) 50 MG tablet Take 1 tablet (50 mg total) by mouth every 6 (six) hours as needed for severe  pain. (Patient not taking: Reported on 07/17/2017)   . traMADol (ULTRAM) 50 MG tablet Take 1 tablet (50 mg total) by mouth every 6 (six) hours as needed for severe pain. (Patient not taking: Reported on 07/08/2017)    No facility-administered encounter medications on file as of 07/31/2017.     Functional Status:   In your present state of health, do you have any difficulty performing the following activities: 07/31/2017 07/22/2017  Hearing? N N  Vision? Y Y  Difficulty concentrating or making decisions? N N  Walking or  climbing stairs? Y Y  Dressing or bathing? N N  Doing errands, shopping? [name]  Preparing Food and eating ? Y -  Using the Toilet? N -  In the past six months, have you accidently leaked urine? N -  Do you have problems with loss of bowel control? N -  Managing your Medications? Y -  Managing your Finances? Y -  Housekeeping or managing your Housekeeping? Y -  Some recent data might be hidden    Fall/Depression Screening:    Fall Risk  07/31/2017 07/22/2017 01/21/2017  Falls in the past year? Yes Yes Yes  Number falls in past yr: _0 Injury with Fall? Yes Yes No  Risk Factor Category  High Fall Risk High Fall Risk -  Risk for fall due to : History of fall(s) - Impaired vision  Risk for fall due to: Comment - - -  Follow up Falls prevention discussed Falls evaluation completed Falls prevention discussed   PHQ 2/9 Scores 07/31/2017 07/22/2017 01/21/2017 01/15/2017 12/20/2016 12/11/2016 10/29/2016  PHQ - 2 Score 0 0 0 0 0 0 0    Assessment:    Met with member at scheduled time.  Daughter, [name], present during visit.  [facility] care management services again explained as member was previously active.  Consent already on file.  Assessment complete.  She expresses concern regarding discomfort in left arm where AV fistula is located.  Daughter report she was seen by vascular MD who removed clots, will use chest catheter for next several sessions until AVF is ready for use again.  Both member and daughter express interest in moving to new dialysis center that will be opening downtown next month.  Advised to discuss transfer with current dialysis center.   Medications reviewed, importance of taking as prescribed discussed.  She is not willing to take all of them, however agrees to restart Renvela, Aspirin, and Omeprazole daily.    Member expresses concern regarding the need for assistance in the home for housekeeping.  Daughter state they (herself and member's granddaughter) are concerned with the member's  ability to independently care for herself in the home alone.  They have offered for her to live with them but she refused.  This care manager discussed possibility of assisted living, however she refuses.  Daughter and granddaughter will continue to provide as much support as possible.  Follow up with primary MD office was last week, no urgent concerns.  Next follow up within a month.  They deny any further concerns at this time, provided with contact information for this care manager.  Advised to contact with questions.  Plan:   Will place referral to LCSW for possible personal care assistance and advanced life planning. Will follow up with dialysis center to inquire about which medications are administered at center. Will follow up with member in 2 weeks to follow up on start of medications, will schedule home visit for next  month at that time.  [facility] CM Care Plan Problem One     Most Recent Value  Care Plan Problem One  Risk for increased ED use or admission related to complications of ESRD as evidenced by recent observation admit  Role Documenting the Problem One  Care Management [employer] for Problem One  Active  [facility] Long Term Goal   Member will not have any ED visis or hospitalizations within the next 31 days  [facility] Long Term Goal Start Date  07/15/17  Interventions for Problem One Long Term Goal  Member and daughter educated on proper management of renal failure, including renal diet, medication management, and compliance with dialysis treatments.  [facility] CM Short Term Goal #1   Member will keep and attend appointment with pirmary MD within the next 2 weeks  [facility] CM Short Term Goal #1 Start Date  07/15/17  [facility] CM Short Term Goal #1 Met Date  07/31/17  Interventions for Short Term Goal #1  Next appointments reviewed with member (next week).  Advised to schedule transportation, if unable to contact this care manager for assistance  [facility] CM Short Term Goal #2   Member will be compliant  with dialysis treatments weekly within the nex 4 weeks  [facility] CM Short Term Goal #2 Start Date  07/15/17  Interventions for Short Term Goal #2  Discussed transfer of member to new dialysis center with daughter.  Advised to inquire about transfer at current dialysis center.     [name], [place], MSN [place] [phone]

## 2017-07-31 NOTE — Patient Outreach (Signed)
[facility]) Care Management  07/31/2017  [place] B Calia 11/26/1940 [id number]  Assessment- CSW received new referral on 07/31/17. Referral states that patient is requesting in home assistance with household tasks and possible errands. Advanced life planning is needed as well.  CSW completed initial outreach call to patient and patient answered. HIPPA verifications provided. CSW introduced self, reason for call and of [facility] social work services. Patient reports interest in gaining an aide to assist her. Patient was explained the [facility] program enrollment process steps and their eligibility requirements. Patient was reminded that the aides through [facility] are hired to assist with her personal daily tasks that she is having difficulty doing for herself and that they will only be able to assist with household task once their other duties have been completed. CSW has been advised this by [facility] in the past. Patient expressed understanding and is ready to start the application process. CSW completed call to Internal Medicine and was provided fax number [phone]. CSW will fax PCS form to PCP office tomorrow.   Patient was educated on advanced life planning during outreach today. Patient unsure if she wishes to appoint a HCPOA or complete advance directives at this time. CSW informed patient that she is happy to complete a home visit and assist her with completing document if she desired. Patient request a phone call next week after she takes time to consider.   Plan-CSW will fax PCS form to PCP on 08/01/17 and will follow up within one week.  [name], BSW, MSW, [email or web address] Phone: [phone] Fax: [phone]

## 2017-08-01 ENCOUNTER — Other Ambulatory Visit: Payer: Self-pay | Admitting: Licensed Clinical Social Worker

## 2017-08-01 ENCOUNTER — Encounter: Payer: Self-pay | Admitting: *Deleted

## 2017-08-01 DIAGNOSIS — N2581 Secondary hyperparathyroidism of renal origin: Secondary | ICD-10-CM | POA: Diagnosis not present

## 2017-08-01 DIAGNOSIS — N186 End stage renal disease: Secondary | ICD-10-CM | POA: Diagnosis not present

## 2017-08-01 DIAGNOSIS — D509 Iron deficiency anemia, unspecified: Secondary | ICD-10-CM | POA: Diagnosis not present

## 2017-08-01 DIAGNOSIS — R51 Headache: Secondary | ICD-10-CM | POA: Diagnosis not present

## 2017-08-01 DIAGNOSIS — D631 Anemia in chronic kidney disease: Secondary | ICD-10-CM | POA: Diagnosis not present

## 2017-08-01 DIAGNOSIS — D689 Coagulation defect, unspecified: Secondary | ICD-10-CM | POA: Diagnosis not present

## 2017-08-01 NOTE — Patient Outreach (Signed)
[facility]) Care Management  08/01/2017  Nancy [name] 1940/11/07 [id number]  Assessment- CSW successfully faxed PCS application form to PCP office on 08/01/17. CSW contacted office to confirm document was received. Fax was received.   Plan-CSW will wait for PCS application to be completed.   [name], BSW, MSW, [email or web address] Phone: [phone] Fax: [phone]

## 2017-08-04 DIAGNOSIS — D631 Anemia in chronic kidney disease: Secondary | ICD-10-CM | POA: Diagnosis not present

## 2017-08-04 DIAGNOSIS — N186 End stage renal disease: Secondary | ICD-10-CM | POA: Diagnosis not present

## 2017-08-04 DIAGNOSIS — N2581 Secondary hyperparathyroidism of renal origin: Secondary | ICD-10-CM | POA: Diagnosis not present

## 2017-08-04 DIAGNOSIS — D509 Iron deficiency anemia, unspecified: Secondary | ICD-10-CM | POA: Diagnosis not present

## 2017-08-04 DIAGNOSIS — R51 Headache: Secondary | ICD-10-CM | POA: Diagnosis not present

## 2017-08-04 DIAGNOSIS — D689 Coagulation defect, unspecified: Secondary | ICD-10-CM | POA: Diagnosis not present

## 2017-08-05 ENCOUNTER — Other Ambulatory Visit: Payer: Self-pay | Admitting: Licensed Clinical Social Worker

## 2017-08-05 NOTE — Patient Outreach (Signed)
[facility]) Care Management  08/05/2017  [place] B Shull 1940/10/21 [id number]  Assessment- CSW completed outreach call to patient and patient answered. HIPPA verifications provided. Patient agreeable to home visit so that CSW can review advance directives document. CSW will assist patient with completing document if she desires. CSW will also provide patient with a list of available PCS agencies in [facility] that she will have to choose from after her PCS assessment is completed with [facility].  Plan-CSW will complete home visit on 08/07/17.   [name], BSW, MSW, [email or web address] Phone: [phone] Fax: [phone]

## 2017-08-06 DIAGNOSIS — D509 Iron deficiency anemia, unspecified: Secondary | ICD-10-CM | POA: Diagnosis not present

## 2017-08-06 DIAGNOSIS — D631 Anemia in chronic kidney disease: Secondary | ICD-10-CM | POA: Diagnosis not present

## 2017-08-06 DIAGNOSIS — N186 End stage renal disease: Secondary | ICD-10-CM | POA: Diagnosis not present

## 2017-08-06 DIAGNOSIS — N2581 Secondary hyperparathyroidism of renal origin: Secondary | ICD-10-CM | POA: Diagnosis not present

## 2017-08-06 DIAGNOSIS — R51 Headache: Secondary | ICD-10-CM | POA: Diagnosis not present

## 2017-08-06 DIAGNOSIS — D689 Coagulation defect, unspecified: Secondary | ICD-10-CM | POA: Diagnosis not present

## 2017-08-07 ENCOUNTER — Other Ambulatory Visit: Payer: Self-pay | Admitting: Licensed Clinical Social Worker

## 2017-08-07 DIAGNOSIS — N186 End stage renal disease: Secondary | ICD-10-CM | POA: Diagnosis not present

## 2017-08-07 DIAGNOSIS — I150 Renovascular hypertension: Secondary | ICD-10-CM | POA: Diagnosis not present

## 2017-08-07 DIAGNOSIS — Z992 Dependence on renal dialysis: Secondary | ICD-10-CM | POA: Diagnosis not present

## 2017-08-07 NOTE — Patient Outreach (Addendum)
Nancy [name]) Care Management  [facility] Social Work  08/07/2017  Nancy [name] Jul 18, 1940 [id number]  Encounter Medications:  Outpatient Encounter Medications as of 08/07/2017  Medication Sig Note  . acetaminophen (TYLENOL) 500 MG tablet Take 2 tablets (1,000 mg total) by mouth every 8 (eight) hours as needed for mild pain, moderate pain, fever or headache.   Marland Kitchen amLODipine (NORVASC) 10 MG tablet Take 1 tablet (10 mg total) by mouth daily. (Patient not taking: Reported on 07/17/2017)   . aspirin EC 81 MG EC tablet Take 1 tablet (81 mg total) by mouth daily. (Patient not taking: Reported on 07/17/2017)   . calcitRIOL (ROCALTROL) 0.25 MCG capsule Take 1 capsule (0.25 mcg total) by mouth daily. (Patient not taking: Reported on 07/17/2017)   . calcium acetate (PHOSLO) 667 MG capsule Take 2 capsules (1,334 mg total) by mouth 3 (three) times daily with meals. (Patient not taking: Reported on 07/17/2017)   . carvedilol (COREG) 12.5 MG tablet Take 12.5 mg by mouth 2 (two) times daily. 07/08/2017: Patien sadi she stopped most medication b/c of nausea  . cloNIDine (CATAPRES - DOSED IN MG/24 HR) 0.2 mg/24hr patch Place 1 patch (0.2 mg total) onto the skin every 7 (seven) days.   . nitroGLYCERIN (NITROSTAT) 0.3 MG SL tablet Place 1 tablet (0.3 mg total) under the tongue every 5 (five) minutes as needed for chest pain. (Patient not taking: Reported on 07/17/2017)   . omeprazole (PRILOSEC) 20 MG capsule Take 1 capsule (20 mg total) by mouth daily. (Patient not taking: Reported on 07/17/2017)   . SENSIPAR 60 MG tablet Take 1 tablet (60 mg total) by mouth daily. (Patient not taking: Reported on 07/17/2017)   . sevelamer carbonate (RENVELA) 800 MG tablet Take 800 mg by mouth 3 (three) times daily.   . traMADol (ULTRAM) 50 MG tablet Take 1 tablet (50 mg total) by mouth every 6 (six) hours as needed for severe pain. (Patient not taking: Reported on 07/17/2017)   . traMADol (ULTRAM) 50 MG tablet Take 1 tablet  (50 mg total) by mouth every 6 (six) hours as needed for severe pain. (Patient not taking: Reported on 07/08/2017)    No facility-administered encounter medications on file as of 08/07/2017.     Functional Status:  In your present state of health, do you have any difficulty performing the following activities: 07/31/2017 07/22/2017  Hearing? N N  Vision? Y Y  Difficulty concentrating or making decisions? N N  Walking or climbing stairs? Y Y  Dressing or bathing? N N  Doing errands, shopping? Nancy [name]  Preparing Food and eating ? Y -  Using the Toilet? N -  In the past six months, have you accidently leaked urine? N -  Do you have problems with loss of bowel control? N -  Managing your Medications? Y -  Managing your Finances? Y -  Housekeeping or managing your Housekeeping? Y -  Some recent data might be hidden    Fall/Depression Screening:  PHQ 2/9 Scores 08/07/2017 07/31/2017 07/31/2017 07/22/2017 01/21/2017 01/15/2017 12/20/2016  PHQ - 2 Score 0 0 0 0 0 0 0    Assessment: CSW completed initial home visit on 08/07/17 at patient's residence. Patient and patient's daughter [name] were present during home visit. Patient is in need of personal care services and is requesting assistance with completing advance directives as well. CSW faxed PCS application to PCP office on 08/01/17. CSW contacted PCP office to see if application was completed yet. CSW was  informed that patient's PCP is in the office on tuesdays and that the [facility] application is in his box. CSW was advised to check back again next week. CSW provided family with a list printed off [facility] [employer] website which includes all personal care service agencies available for her to choose from in [place]. CSW advised family to review list as patient will have to choose one agency once approved for services. CSW provided education on the entire PCS enrollment process. Family expressed understanding.   CSW provided patient with  advance directives and reviewed entire document. Patient agreeable to complete document and appointed her granddaughter as her HCPOA. Patient also completed the living will section of the advance directives. Patient agrees to take advance directives to her local bank tomorrow in order to get in notarized. Patient understands that she will need to provide a copy of this advance directives to her PCP.   Patient denies experiencing any depressive symptoms. However, patient's spouse passed away last year. CSW provided education on available mental health and grief support resources. Patient states "I'm not going anywhere near Hospice." Patient declined both mental health and grief support assistance but is agreeable to contact CSW if she ever changes her mind.   Patient denies having any transportation issues. Patient states that she uses Medicaid as her primary source of stable transportation.   [facility] CM Care Plan Problem One     Most Recent Value  Care Plan Problem One  Lack of care within the home  Role Documenting the Problem One  Clinical Social Worker  Care Plan for Problem One  Active  [facility] Long Term Goal   Patient will gain an aide through [facility] within 90 days as evidenced by [facility] confirmation.   [facility] Long Term Goal Start Date  08/05/17  Interventions for Problem One Long Term Goal  CSW contacted PCP office to gain update. PCS application not completed yet but it was suggested to check back next week. CSW provided family with a list of available PCS agencies that they will have to choose from. CSW educated family on the entire PCS enrollment process.  [facility] CM Short Term Goal #1   Member will complete advance directives within 30 days as evidenced by self report  [facility] CM Short Term Goal #1 Start Date  08/07/17  Interventions for Short Term Goal #1  CSW completed home visit and assisted patient with filling out advance directives. CSW educated her on the completion process.  Patient agreeable to take document to bank tomorrow. CSW will follow up accordingly.     Plan: CSW will follow up with PCP office within one week to check on PCS application status. CSW will route encounter to PCP.  [name], BSW, MSW, [email or web address] Phone: [phone] Fax: [phone]

## 2017-08-08 DIAGNOSIS — Z992 Dependence on renal dialysis: Secondary | ICD-10-CM | POA: Diagnosis not present

## 2017-08-08 DIAGNOSIS — I150 Renovascular hypertension: Secondary | ICD-10-CM | POA: Diagnosis not present

## 2017-08-08 DIAGNOSIS — N186 End stage renal disease: Secondary | ICD-10-CM | POA: Diagnosis not present

## 2017-08-11 DIAGNOSIS — N2581 Secondary hyperparathyroidism of renal origin: Secondary | ICD-10-CM | POA: Diagnosis not present

## 2017-08-11 DIAGNOSIS — N186 End stage renal disease: Secondary | ICD-10-CM | POA: Diagnosis not present

## 2017-08-11 DIAGNOSIS — D689 Coagulation defect, unspecified: Secondary | ICD-10-CM | POA: Diagnosis not present

## 2017-08-11 DIAGNOSIS — D631 Anemia in chronic kidney disease: Secondary | ICD-10-CM | POA: Diagnosis not present

## 2017-08-11 DIAGNOSIS — D509 Iron deficiency anemia, unspecified: Secondary | ICD-10-CM | POA: Diagnosis not present

## 2017-08-11 DIAGNOSIS — Z23 Encounter for immunization: Secondary | ICD-10-CM | POA: Diagnosis not present

## 2017-08-12 ENCOUNTER — Other Ambulatory Visit: Payer: Self-pay | Admitting: *Deleted

## 2017-08-12 NOTE — Patient Outreach (Signed)
[facility]) Care Management  08/12/2017  Nancy [name] 1940/12/02 [id number]   Call placed to dialysis center to inquire about medications member is taking when there for treatment.  Spoke with charge nurse Adam, confirmed she is receiving Sensipar, iron, and Calcitriol.  Follow up call placed to member to inquire about medications she stated she would try to restart (Renvela, Omeprazole and Aspirin).  She state she has been taking without problems.  Denies any urgent concerns at this time, routine home visit scheduled for within the next 2 weeks.  Advised to contact with questions prior to visit.   [name], [place], MSN [place] [phone]

## 2017-08-13 ENCOUNTER — Other Ambulatory Visit: Payer: Self-pay | Admitting: Licensed Clinical Social Worker

## 2017-08-13 DIAGNOSIS — Z23 Encounter for immunization: Secondary | ICD-10-CM | POA: Diagnosis not present

## 2017-08-13 DIAGNOSIS — D631 Anemia in chronic kidney disease: Secondary | ICD-10-CM | POA: Diagnosis not present

## 2017-08-13 DIAGNOSIS — N186 End stage renal disease: Secondary | ICD-10-CM | POA: Diagnosis not present

## 2017-08-13 DIAGNOSIS — D509 Iron deficiency anemia, unspecified: Secondary | ICD-10-CM | POA: Diagnosis not present

## 2017-08-13 DIAGNOSIS — N2581 Secondary hyperparathyroidism of renal origin: Secondary | ICD-10-CM | POA: Diagnosis not present

## 2017-08-13 DIAGNOSIS — D689 Coagulation defect, unspecified: Secondary | ICD-10-CM | POA: Diagnosis not present

## 2017-08-13 NOTE — Patient Outreach (Signed)
[place] [facility]) Care Management  08/13/2017  Nancy [name] 13-Nov-1940 [id number]  Assessment- CSW completed call to PCP office and spoke to front desk. CSW questioned if PCS application was completed yet. CSW was informed that patient's PCP is in the office today and that form will be completed today.  Plan-CSW will follow up with [facility] within one week.  [name], BSW, MSW, [email or web address] Phone: [phone] Fax: [phone]

## 2017-08-15 DIAGNOSIS — D631 Anemia in chronic kidney disease: Secondary | ICD-10-CM | POA: Diagnosis not present

## 2017-08-15 DIAGNOSIS — D689 Coagulation defect, unspecified: Secondary | ICD-10-CM | POA: Diagnosis not present

## 2017-08-15 DIAGNOSIS — N186 End stage renal disease: Secondary | ICD-10-CM | POA: Diagnosis not present

## 2017-08-15 DIAGNOSIS — D509 Iron deficiency anemia, unspecified: Secondary | ICD-10-CM | POA: Diagnosis not present

## 2017-08-15 DIAGNOSIS — Z23 Encounter for immunization: Secondary | ICD-10-CM | POA: Diagnosis not present

## 2017-08-15 DIAGNOSIS — N2581 Secondary hyperparathyroidism of renal origin: Secondary | ICD-10-CM | POA: Diagnosis not present

## 2017-08-18 DIAGNOSIS — N186 End stage renal disease: Secondary | ICD-10-CM | POA: Diagnosis not present

## 2017-08-18 DIAGNOSIS — D689 Coagulation defect, unspecified: Secondary | ICD-10-CM | POA: Diagnosis not present

## 2017-08-18 DIAGNOSIS — D509 Iron deficiency anemia, unspecified: Secondary | ICD-10-CM | POA: Diagnosis not present

## 2017-08-18 DIAGNOSIS — Z23 Encounter for immunization: Secondary | ICD-10-CM | POA: Diagnosis not present

## 2017-08-18 DIAGNOSIS — D631 Anemia in chronic kidney disease: Secondary | ICD-10-CM | POA: Diagnosis not present

## 2017-08-18 DIAGNOSIS — N2581 Secondary hyperparathyroidism of renal origin: Secondary | ICD-10-CM | POA: Diagnosis not present

## 2017-08-20 ENCOUNTER — Other Ambulatory Visit: Payer: Self-pay | Admitting: Licensed Clinical Social Worker

## 2017-08-20 DIAGNOSIS — N186 End stage renal disease: Secondary | ICD-10-CM | POA: Diagnosis not present

## 2017-08-20 DIAGNOSIS — D689 Coagulation defect, unspecified: Secondary | ICD-10-CM | POA: Diagnosis not present

## 2017-08-20 DIAGNOSIS — D509 Iron deficiency anemia, unspecified: Secondary | ICD-10-CM | POA: Diagnosis not present

## 2017-08-20 DIAGNOSIS — N2581 Secondary hyperparathyroidism of renal origin: Secondary | ICD-10-CM | POA: Diagnosis not present

## 2017-08-20 DIAGNOSIS — D631 Anemia in chronic kidney disease: Secondary | ICD-10-CM | POA: Diagnosis not present

## 2017-08-20 DIAGNOSIS — Z23 Encounter for immunization: Secondary | ICD-10-CM | POA: Diagnosis not present

## 2017-08-20 NOTE — Patient Outreach (Signed)
[facility]) Care Management  08/20/2017  Nancy [name] 10-19-1940 [id number]  Assessment- CSW completed care coordination call to [facility] to check on PCS application status. CSW was informed that patient was contacted and a nursing assessment was completed on 08/14/17. Patient was approved for [facility] services. Patient has chosen [employer] as her personal care agency and they have accepted her for services. CSW was informed that agency should be contacting patient today.  Plan-CSW will follow up with patient next week to make sure goal was achieved. CSW will also follow up on advance directives at that time.   [name], BSW, MSW, [email or web address] Phone: [phone] Fax: [phone]

## 2017-08-22 DIAGNOSIS — Z23 Encounter for immunization: Secondary | ICD-10-CM | POA: Diagnosis not present

## 2017-08-22 DIAGNOSIS — D689 Coagulation defect, unspecified: Secondary | ICD-10-CM | POA: Diagnosis not present

## 2017-08-22 DIAGNOSIS — D509 Iron deficiency anemia, unspecified: Secondary | ICD-10-CM | POA: Diagnosis not present

## 2017-08-22 DIAGNOSIS — D631 Anemia in chronic kidney disease: Secondary | ICD-10-CM | POA: Diagnosis not present

## 2017-08-22 DIAGNOSIS — N186 End stage renal disease: Secondary | ICD-10-CM | POA: Diagnosis not present

## 2017-08-22 DIAGNOSIS — N2581 Secondary hyperparathyroidism of renal origin: Secondary | ICD-10-CM | POA: Diagnosis not present

## 2017-08-25 ENCOUNTER — Other Ambulatory Visit: Payer: Self-pay | Admitting: Licensed Clinical Social Worker

## 2017-08-25 DIAGNOSIS — D631 Anemia in chronic kidney disease: Secondary | ICD-10-CM | POA: Diagnosis not present

## 2017-08-25 DIAGNOSIS — D689 Coagulation defect, unspecified: Secondary | ICD-10-CM | POA: Diagnosis not present

## 2017-08-25 DIAGNOSIS — N186 End stage renal disease: Secondary | ICD-10-CM | POA: Diagnosis not present

## 2017-08-25 DIAGNOSIS — Z23 Encounter for immunization: Secondary | ICD-10-CM | POA: Diagnosis not present

## 2017-08-25 DIAGNOSIS — D509 Iron deficiency anemia, unspecified: Secondary | ICD-10-CM | POA: Diagnosis not present

## 2017-08-25 DIAGNOSIS — N2581 Secondary hyperparathyroidism of renal origin: Secondary | ICD-10-CM | POA: Diagnosis not present

## 2017-08-25 NOTE — Patient Outreach (Signed)
[facility]) Care Management  08/25/2017  [place] B Wisser [birth year] [id number]  Assessment- CSW completed outreach call to patient and patient answered. She confirms that [employer] has officially started providing personal care services as of 08/23/17. Patient reports that her aide will come Tuesdays, Thursdays and Saturdays. However, patient has a medical appointment tomorrow at 9 am and does not know how to contact her aide to inform her. CSW completed call to [facility] and provided update. Message will passed along to Aide accordingly to staff member CSW spoke with. Patient denies any further social work needs at this time. She shares that she is still working on her advance directives with her granddaughter and has not yet had document notarized. Patient was educated on the final steps to compelte advance directives. Patient feels comfortable in these steps and no longer wishes to work on goal at this time. Patient is agreeable to social work discharge now that social work goals have been met. Patient appreciative of social work assistance and was encouraged to contact this CSW in the future if she has any other social work needs.  [facility] CM Care Plan Problem One     Most Recent Value  Care Plan Problem One  Lack of care within the home  Role Documenting the Problem One  Clinical Social Worker  Care Plan for Problem One  Active  [facility] Long Term Goal   Patient will gain an aide through [facility] within 90 days as evidenced by [facility] confirmation.   [facility] Long Term Goal Start Date  08/05/17  [facility] Long Term Goal Met Date  08/25/17  Interventions for Problem One Long Term Goal  Goal met.  [facility] CM Short Term Goal #1   Member will complete advance directives within 30 days as evidenced by self report  [facility] CM Short Term Goal #1 Start Date  08/07/17  Interventions for Short Term Goal #1  Patient no longer wishes to work on this goal.     Plan-CSW will close case at  this time and notify PCP and [facility] RNCM.  [name], BSW, [email or web address], [email or web address] Phone: [phone] Fax: [phone]

## 2017-08-26 DIAGNOSIS — Z452 Encounter for adjustment and management of vascular access device: Secondary | ICD-10-CM | POA: Diagnosis not present

## 2017-08-27 DIAGNOSIS — D631 Anemia in chronic kidney disease: Secondary | ICD-10-CM | POA: Diagnosis not present

## 2017-08-27 DIAGNOSIS — Z23 Encounter for immunization: Secondary | ICD-10-CM | POA: Diagnosis not present

## 2017-08-27 DIAGNOSIS — N186 End stage renal disease: Secondary | ICD-10-CM | POA: Diagnosis not present

## 2017-08-27 DIAGNOSIS — D509 Iron deficiency anemia, unspecified: Secondary | ICD-10-CM | POA: Diagnosis not present

## 2017-08-27 DIAGNOSIS — D689 Coagulation defect, unspecified: Secondary | ICD-10-CM | POA: Diagnosis not present

## 2017-08-27 DIAGNOSIS — N2581 Secondary hyperparathyroidism of renal origin: Secondary | ICD-10-CM | POA: Diagnosis not present

## 2017-08-28 ENCOUNTER — Other Ambulatory Visit: Payer: Self-pay | Admitting: *Deleted

## 2017-08-28 NOTE — Patient Outreach (Signed)
[facility]) Care Management   08/28/2017  Nancy [name] [birth year] [id number]  Nancy [name] is an 77 y.o. female  Subjective:   Member alert and oriented x3, denies complaints of pain or discomfort at this time.  Report she is taking the 3 medications as discussed during last home visit (Aspirin, Renvela, and Omeprazole) as well as Iron supplements.  State she need refill for Clonidine patch however pharmacy unable to fill until next week due to insurance.  Objective:   Review of Systems  Constitutional: Negative.   HENT: Negative.   Eyes: Negative.   Respiratory: Negative.   Cardiovascular: Negative.   Gastrointestinal: Negative.   Genitourinary: Negative.   Musculoskeletal: Negative.   Skin: Negative.   Neurological: Negative.   Endo/Heme/Allergies: Negative.   Psychiatric/Behavioral: Negative.     Physical Exam  Constitutional: She is oriented to person, place, and time. She appears well-developed and well-nourished.  Neck: Normal range of motion.  Cardiovascular: Normal rate, regular rhythm and normal heart sounds.  Respiratory: Effort normal and breath sounds normal.  GI: Soft. Bowel sounds are normal.  Musculoskeletal: Normal range of motion.  Neurological: She is alert and oriented to person, place, and time.  Skin: Skin is warm and dry.   BP 122/60 (BP Location: Right Arm, Patient Position: Sitting, Cuff Size: Normal)   Pulse 82   Resp 18   SpO2 99%   Encounter Medications:   Outpatient Encounter Medications as of 08/28/2017  Medication Sig Note  . acetaminophen (TYLENOL) 500 MG tablet Take 2 tablets (1,000 mg total) by mouth every 8 (eight) hours as needed for mild pain, moderate pain, fever or headache.   Marland Kitchen aspirin EC 81 MG EC tablet Take 1 tablet (81 mg total) by mouth daily.   . calcitRIOL (ROCALTROL) 0.25 MCG capsule Take 1 capsule (0.25 mcg total) by mouth daily. 08/28/2017: Given at dialysis  . cloNIDine (CATAPRES - DOSED IN MG/24  HR) 0.2 mg/24hr patch Place 1 patch (0.2 mg total) onto the skin every 7 (seven) days.   . ferric citrate (AURYXIA) 1 GM 210 MG(Fe) tablet Take 210 mg by mouth daily.   . SENSIPAR 60 MG tablet Take 1 tablet (60 mg total) by mouth daily. 08/28/2017: Given at dialysis  . sevelamer carbonate (RENVELA) 800 MG tablet Take 800 mg by mouth 3 (three) times daily.   Marland Kitchen amLODipine (NORVASC) 10 MG tablet Take 1 tablet (10 mg total) by mouth daily. (Patient not taking: Reported on 07/17/2017)   . calcium acetate (PHOSLO) 667 MG capsule Take 2 capsules (1,334 mg total) by mouth 3 (three) times daily with meals. (Patient not taking: Reported on 07/17/2017)   . carvedilol (COREG) 12.5 MG tablet Take 12.5 mg by mouth 2 (two) times daily. 07/08/2017: Patien sadi she stopped most medication b/c of nausea  . nitroGLYCERIN (NITROSTAT) 0.3 MG SL tablet Place 1 tablet (0.3 mg total) under the tongue every 5 (five) minutes as needed for chest pain. (Patient not taking: Reported on 07/17/2017)   . omeprazole (PRILOSEC) 20 MG capsule Take 1 capsule (20 mg total) by mouth daily. (Patient not taking: Reported on 07/17/2017)   . traMADol (ULTRAM) 50 MG tablet Take 1 tablet (50 mg total) by mouth every 6 (six) hours as needed for severe pain. (Patient not taking: Reported on 07/17/2017)   . traMADol (ULTRAM) 50 MG tablet Take 1 tablet (50 mg total) by mouth every 6 (six) hours as needed for severe pain. (Patient not taking: Reported on  07/08/2017)    No facility-administered encounter medications on file as of 08/28/2017.     Functional Status:   In your present state of health, do you have any difficulty performing the following activities: 07/31/2017 07/22/2017  Hearing? N N  Vision? Y Y  Difficulty concentrating or making decisions? N N  Walking or climbing stairs? Y Y  Dressing or bathing? N N  Doing errands, shopping? [name]  Preparing Food and eating ? Y -  Using the Toilet? N -  In the past six months, have you accidently leaked  urine? N -  Do you have problems with loss of bowel control? N -  Managing your Medications? Y -  Managing your Finances? Y -  Housekeeping or managing your Housekeeping? Y -  Some recent data might be hidden    Fall/Depression Screening:    Fall Risk  07/31/2017 07/22/2017 01/21/2017  Falls in the past year? Yes Yes Yes  Number falls in past yr: '1 1 1  ' Injury with Fall? Yes Yes No  Risk Factor Category  High Fall Risk High Fall Risk -  Risk for fall due to : History of fall(s) - Impaired vision  Risk for fall due to: Comment - - -  Follow up Falls prevention discussed Falls evaluation completed Falls prevention discussed   PHQ 2/9 Scores 08/07/2017 07/31/2017 07/31/2017 07/22/2017 01/21/2017 01/15/2017 12/20/2016  PHQ - 2 Score 0 0 0 0 0 0 0    Assessment:    Met with member at scheduled time.  She report she is doing well, state she now has in home assistance through [facility].  Report compliance with dialysis, no complications.  She has appointment with primary MD scheduled for 3/26, reminder written on calendar.  Denies any urgent concerns at this time, advised to contact this care manager with questions.  Plan:   Will follow up within the next 2 weeks regarding clonidine patch, if no further nursing needs will close case.  [facility] CM Care Plan Problem One     Most Recent Value  Care Plan Problem One  Risk for increased ED use or admission related to complications of ESRD as evidenced by recent observation admit  Role Documenting the Problem One  Care Management [place] for Problem One  Active  [facility] Long Term Goal   Member will not have any ED visis or hospitalizations within the next 31 days  [facility] Long Term Goal Start Date  07/15/17  [facility] Long Term Goal Met Date  08/28/17  Interventions for Problem One Long Term Goal  Member and daughter educated on proper management of renal failure, including renal diet, medication management, and compliance with dialysis  treatments.  [facility] CM Short Term Goal #1   Member will have clonidine patch within the next 2 weeks  [facility] CM Short Term Goal #1 Start Date  08/28/17  Interventions for Short Term Goal #1  Call placed to pharmacy to request refill.  [facility] CM Short Term Goal #2   Member will be compliant with dialysis treatments weekly within the nex 4 weeks  [facility] CM Short Term Goal #2 Start Date  07/15/17  [facility] CM Short Term Goal #2 Met Date  08/28/17  Interventions for Short Term Goal #2  Discussed transfer of member to new dialysis center with daughter.  Advised to inquire about transfer at current dialysis center.     [name], [place], MSN [place] [phone]

## 2017-08-29 DIAGNOSIS — D631 Anemia in chronic kidney disease: Secondary | ICD-10-CM | POA: Diagnosis not present

## 2017-08-29 DIAGNOSIS — N2581 Secondary hyperparathyroidism of renal origin: Secondary | ICD-10-CM | POA: Diagnosis not present

## 2017-08-29 DIAGNOSIS — N186 End stage renal disease: Secondary | ICD-10-CM | POA: Diagnosis not present

## 2017-08-29 DIAGNOSIS — D689 Coagulation defect, unspecified: Secondary | ICD-10-CM | POA: Diagnosis not present

## 2017-08-29 DIAGNOSIS — Z23 Encounter for immunization: Secondary | ICD-10-CM | POA: Diagnosis not present

## 2017-08-29 DIAGNOSIS — D509 Iron deficiency anemia, unspecified: Secondary | ICD-10-CM | POA: Diagnosis not present

## 2017-09-01 DIAGNOSIS — N186 End stage renal disease: Secondary | ICD-10-CM | POA: Diagnosis not present

## 2017-09-01 DIAGNOSIS — D689 Coagulation defect, unspecified: Secondary | ICD-10-CM | POA: Diagnosis not present

## 2017-09-01 DIAGNOSIS — D631 Anemia in chronic kidney disease: Secondary | ICD-10-CM | POA: Diagnosis not present

## 2017-09-01 DIAGNOSIS — N2581 Secondary hyperparathyroidism of renal origin: Secondary | ICD-10-CM | POA: Diagnosis not present

## 2017-09-01 DIAGNOSIS — D509 Iron deficiency anemia, unspecified: Secondary | ICD-10-CM | POA: Diagnosis not present

## 2017-09-01 DIAGNOSIS — Z23 Encounter for immunization: Secondary | ICD-10-CM | POA: Diagnosis not present

## 2017-09-03 DIAGNOSIS — N2581 Secondary hyperparathyroidism of renal origin: Secondary | ICD-10-CM | POA: Diagnosis not present

## 2017-09-03 DIAGNOSIS — D689 Coagulation defect, unspecified: Secondary | ICD-10-CM | POA: Diagnosis not present

## 2017-09-03 DIAGNOSIS — D631 Anemia in chronic kidney disease: Secondary | ICD-10-CM | POA: Diagnosis not present

## 2017-09-03 DIAGNOSIS — N186 End stage renal disease: Secondary | ICD-10-CM | POA: Diagnosis not present

## 2017-09-03 DIAGNOSIS — Z23 Encounter for immunization: Secondary | ICD-10-CM | POA: Diagnosis not present

## 2017-09-03 DIAGNOSIS — D509 Iron deficiency anemia, unspecified: Secondary | ICD-10-CM | POA: Diagnosis not present

## 2017-09-05 DIAGNOSIS — D509 Iron deficiency anemia, unspecified: Secondary | ICD-10-CM | POA: Diagnosis not present

## 2017-09-05 DIAGNOSIS — D689 Coagulation defect, unspecified: Secondary | ICD-10-CM | POA: Diagnosis not present

## 2017-09-05 DIAGNOSIS — Z992 Dependence on renal dialysis: Secondary | ICD-10-CM | POA: Diagnosis not present

## 2017-09-05 DIAGNOSIS — N186 End stage renal disease: Secondary | ICD-10-CM | POA: Diagnosis not present

## 2017-09-05 DIAGNOSIS — D631 Anemia in chronic kidney disease: Secondary | ICD-10-CM | POA: Diagnosis not present

## 2017-09-05 DIAGNOSIS — I150 Renovascular hypertension: Secondary | ICD-10-CM | POA: Diagnosis not present

## 2017-09-05 DIAGNOSIS — R51 Headache: Secondary | ICD-10-CM | POA: Diagnosis not present

## 2017-09-05 DIAGNOSIS — N2581 Secondary hyperparathyroidism of renal origin: Secondary | ICD-10-CM | POA: Diagnosis not present

## 2017-09-08 DIAGNOSIS — N2581 Secondary hyperparathyroidism of renal origin: Secondary | ICD-10-CM | POA: Diagnosis not present

## 2017-09-08 DIAGNOSIS — D509 Iron deficiency anemia, unspecified: Secondary | ICD-10-CM | POA: Diagnosis not present

## 2017-09-08 DIAGNOSIS — N186 End stage renal disease: Secondary | ICD-10-CM | POA: Diagnosis not present

## 2017-09-08 DIAGNOSIS — D689 Coagulation defect, unspecified: Secondary | ICD-10-CM | POA: Diagnosis not present

## 2017-09-08 DIAGNOSIS — R51 Headache: Secondary | ICD-10-CM | POA: Diagnosis not present

## 2017-09-08 DIAGNOSIS — D631 Anemia in chronic kidney disease: Secondary | ICD-10-CM | POA: Diagnosis not present

## 2017-09-10 DIAGNOSIS — N2581 Secondary hyperparathyroidism of renal origin: Secondary | ICD-10-CM | POA: Diagnosis not present

## 2017-09-10 DIAGNOSIS — D509 Iron deficiency anemia, unspecified: Secondary | ICD-10-CM | POA: Diagnosis not present

## 2017-09-10 DIAGNOSIS — R51 Headache: Secondary | ICD-10-CM | POA: Diagnosis not present

## 2017-09-10 DIAGNOSIS — D631 Anemia in chronic kidney disease: Secondary | ICD-10-CM | POA: Diagnosis not present

## 2017-09-10 DIAGNOSIS — D689 Coagulation defect, unspecified: Secondary | ICD-10-CM | POA: Diagnosis not present

## 2017-09-10 DIAGNOSIS — N186 End stage renal disease: Secondary | ICD-10-CM | POA: Diagnosis not present

## 2017-09-11 ENCOUNTER — Other Ambulatory Visit: Payer: Self-pay | Admitting: *Deleted

## 2017-09-11 NOTE — Patient Outreach (Signed)
[place] [facility]) Care Management  09/11/2017  [place] B Schoppe [birth year] [id number]   Call placed to member to follow up on current health status and clonidine patch.  She report she is doing well, state she has obtained her medications.  State she has continued to be compliant with dialysis.  Denies any further needs from this care manager.  Advised that this care manage will close case.  Reminded of primary MD appointment on 3/26.  Will notify primary MD and care management assistant of case closure.  [facility] CM Care Plan Problem One     Most Recent Value  [facility] CM Short Term Goal #1   Member will have clonidine patch within the next 2 weeks  [facility] CM Short Term Goal #1 Start Date  08/28/17  [facility] CM Short Term Goal #1 Met Date  09/11/17  Interventions for Short Term Goal #1  Call placed to pharmacy to request refill.     [name], [place], MSN [place] [phone]

## 2017-09-12 DIAGNOSIS — D509 Iron deficiency anemia, unspecified: Secondary | ICD-10-CM | POA: Diagnosis not present

## 2017-09-12 DIAGNOSIS — D631 Anemia in chronic kidney disease: Secondary | ICD-10-CM | POA: Diagnosis not present

## 2017-09-12 DIAGNOSIS — R51 Headache: Secondary | ICD-10-CM | POA: Diagnosis not present

## 2017-09-12 DIAGNOSIS — N186 End stage renal disease: Secondary | ICD-10-CM | POA: Diagnosis not present

## 2017-09-12 DIAGNOSIS — D689 Coagulation defect, unspecified: Secondary | ICD-10-CM | POA: Diagnosis not present

## 2017-09-12 DIAGNOSIS — N2581 Secondary hyperparathyroidism of renal origin: Secondary | ICD-10-CM | POA: Diagnosis not present

## 2017-09-15 DIAGNOSIS — D631 Anemia in chronic kidney disease: Secondary | ICD-10-CM | POA: Diagnosis not present

## 2017-09-15 DIAGNOSIS — N186 End stage renal disease: Secondary | ICD-10-CM | POA: Diagnosis not present

## 2017-09-15 DIAGNOSIS — R51 Headache: Secondary | ICD-10-CM | POA: Diagnosis not present

## 2017-09-15 DIAGNOSIS — D689 Coagulation defect, unspecified: Secondary | ICD-10-CM | POA: Diagnosis not present

## 2017-09-15 DIAGNOSIS — D509 Iron deficiency anemia, unspecified: Secondary | ICD-10-CM | POA: Diagnosis not present

## 2017-09-15 DIAGNOSIS — N2581 Secondary hyperparathyroidism of renal origin: Secondary | ICD-10-CM | POA: Diagnosis not present

## 2017-09-17 DIAGNOSIS — D631 Anemia in chronic kidney disease: Secondary | ICD-10-CM | POA: Diagnosis not present

## 2017-09-17 DIAGNOSIS — D689 Coagulation defect, unspecified: Secondary | ICD-10-CM | POA: Diagnosis not present

## 2017-09-17 DIAGNOSIS — N2581 Secondary hyperparathyroidism of renal origin: Secondary | ICD-10-CM | POA: Diagnosis not present

## 2017-09-17 DIAGNOSIS — D509 Iron deficiency anemia, unspecified: Secondary | ICD-10-CM | POA: Diagnosis not present

## 2017-09-17 DIAGNOSIS — N186 End stage renal disease: Secondary | ICD-10-CM | POA: Diagnosis not present

## 2017-09-17 DIAGNOSIS — R51 Headache: Secondary | ICD-10-CM | POA: Diagnosis not present

## 2017-09-19 ENCOUNTER — Encounter (HOSPITAL_COMMUNITY): Payer: Self-pay

## 2017-09-19 ENCOUNTER — Other Ambulatory Visit: Payer: Self-pay

## 2017-09-19 ENCOUNTER — Emergency Department (HOSPITAL_COMMUNITY): Payer: Medicare Other

## 2017-09-19 ENCOUNTER — Observation Stay (HOSPITAL_COMMUNITY)
Admission: EM | Admit: 2017-09-19 | Discharge: 2017-09-20 | Disposition: A | Discharge status: Home / Self Care | Payer: Medicare Other | Attending: Internal Medicine | Admitting: Internal Medicine

## 2017-09-19 DIAGNOSIS — I350 Nonrheumatic aortic (valve) stenosis: Secondary | ICD-10-CM | POA: Insufficient documentation

## 2017-09-19 DIAGNOSIS — M170 Bilateral primary osteoarthritis of knee: Secondary | ICD-10-CM | POA: Insufficient documentation

## 2017-09-19 DIAGNOSIS — N2581 Secondary hyperparathyroidism of renal origin: Secondary | ICD-10-CM | POA: Diagnosis not present

## 2017-09-19 DIAGNOSIS — R252 Cramp and spasm: Secondary | ICD-10-CM | POA: Diagnosis not present

## 2017-09-19 DIAGNOSIS — D689 Coagulation defect, unspecified: Secondary | ICD-10-CM | POA: Diagnosis not present

## 2017-09-19 DIAGNOSIS — Z992 Dependence on renal dialysis: Secondary | ICD-10-CM | POA: Insufficient documentation

## 2017-09-19 DIAGNOSIS — Z885 Allergy status to narcotic agent status: Secondary | ICD-10-CM | POA: Insufficient documentation

## 2017-09-19 DIAGNOSIS — R0789 Other chest pain: Principal | ICD-10-CM | POA: Insufficient documentation

## 2017-09-19 DIAGNOSIS — R072 Precordial pain: Secondary | ICD-10-CM | POA: Diagnosis not present

## 2017-09-19 DIAGNOSIS — D509 Iron deficiency anemia, unspecified: Secondary | ICD-10-CM | POA: Diagnosis not present

## 2017-09-19 DIAGNOSIS — K219 Gastro-esophageal reflux disease without esophagitis: Secondary | ICD-10-CM | POA: Diagnosis not present

## 2017-09-19 DIAGNOSIS — Z79899 Other long term (current) drug therapy: Secondary | ICD-10-CM | POA: Diagnosis not present

## 2017-09-19 DIAGNOSIS — I509 Heart failure, unspecified: Secondary | ICD-10-CM | POA: Diagnosis not present

## 2017-09-19 DIAGNOSIS — I252 Old myocardial infarction: Secondary | ICD-10-CM | POA: Insufficient documentation

## 2017-09-19 DIAGNOSIS — R079 Chest pain, unspecified: Secondary | ICD-10-CM | POA: Diagnosis present

## 2017-09-19 DIAGNOSIS — Z8673 Personal history of transient ischemic attack (TIA), and cerebral infarction without residual deficits: Secondary | ICD-10-CM | POA: Diagnosis not present

## 2017-09-19 DIAGNOSIS — I132 Hypertensive heart and chronic kidney disease with heart failure and with stage 5 chronic kidney disease, or end stage renal disease: Secondary | ICD-10-CM | POA: Diagnosis not present

## 2017-09-19 DIAGNOSIS — Z7982 Long term (current) use of aspirin: Secondary | ICD-10-CM | POA: Diagnosis not present

## 2017-09-19 DIAGNOSIS — R42 Dizziness and giddiness: Secondary | ICD-10-CM | POA: Diagnosis not present

## 2017-09-19 DIAGNOSIS — R51 Headache: Secondary | ICD-10-CM | POA: Diagnosis not present

## 2017-09-19 DIAGNOSIS — N186 End stage renal disease: Secondary | ICD-10-CM | POA: Insufficient documentation

## 2017-09-19 DIAGNOSIS — D631 Anemia in chronic kidney disease: Secondary | ICD-10-CM | POA: Diagnosis not present

## 2017-09-19 DIAGNOSIS — R110 Nausea: Secondary | ICD-10-CM | POA: Diagnosis not present

## 2017-09-19 LAB — BASIC METABOLIC PANEL
ANION GAP: 13 (ref 5–15)
BUN: 28 mg/dL — ABNORMAL HIGH (ref 6–20)
CO2: 26 mmol/L (ref 22–32)
Calcium: 8.9 mg/dL (ref 8.9–10.3)
Chloride: 97 mmol/L — ABNORMAL LOW (ref 101–111)
Creatinine, Ser: 4.59 mg/dL — ABNORMAL HIGH (ref 0.44–1.00)
GFR calc Af Amer: 10 mL/min — ABNORMAL LOW (ref >60)
GFR, EST NON AFRICAN AMERICAN: 9 mL/min — AB (ref >60)
GLUCOSE: 114 mg/dL — AB (ref 65–99)
Potassium: 3.7 mmol/L (ref 3.5–5.1)
Sodium: 136 mmol/L (ref 135–145)

## 2017-09-19 LAB — CBC
HCT: 33.4 % — ABNORMAL LOW (ref 36.0–46.0)
HEMOGLOBIN: 10.7 g/dL — AB (ref 12.0–15.0)
MCH: 31.2 pg (ref 26.0–34.0)
MCHC: 32 g/dL (ref 30.0–36.0)
MCV: 97.4 fL (ref 78.0–100.0)
PLATELETS: 175 10*3/uL (ref 150–400)
RBC: 3.43 MIL/uL — ABNORMAL LOW (ref 3.87–5.11)
RDW: 16.5 % — AB (ref 11.5–15.5)
WBC: 4.9 10*3/uL (ref 4.0–10.5)

## 2017-09-19 LAB — I-STAT TROPONIN, ED: Troponin i, poc: 0.01 ng/mL (ref 0.00–0.08)

## 2017-09-19 MED ORDER — SODIUM CHLORIDE 0.9 % IV BOLUS (SEPSIS)
250.0000 mL | Freq: Once | INTRAVENOUS | Status: AC
  Administered 2017-09-19: 250 mL via INTRAVENOUS

## 2017-09-19 MED ORDER — MORPHINE SULFATE (PF) 4 MG/ML IV SOLN
2.0000 mg | Freq: Once | INTRAVENOUS | Status: AC
  Administered 2017-09-19: 2 mg via INTRAVENOUS
  Filled 2017-09-19: qty 1

## 2017-09-19 NOTE — ED Provider Notes (Signed)
[facility] EMERGENCY DEPARTMENT Provider Note   CSN: [id number] Arrival date & time: 09/19/17  2120     History   Chief Complaint Chief Complaint  Patient presents with  . Chest Pain    HPI Nancy [name] is a 77 y.o. female.   Chest Pain   This is a new problem. The current episode started 6 to 12 hours ago. The problem occurs constantly. The problem has been gradually improving. The pain is present in the substernal region. The pain is at a severity of 9/10. The pain is severe. The quality of the pain is described as heavy and pressure-like. The pain radiates to the left shoulder. Duration of episode(s) is 5 hours. Associated symptoms include nausea. Pertinent negatives include no abdominal pain, no back pain, no cough, no diaphoresis, no fever, no headaches, no irregular heartbeat, no leg pain, no lower extremity edema, no near-syncope, no palpitations, no shortness of breath, no sputum production and no vomiting.  -Patient states she started having heavy chest pain, 10/10 in the middle of her chest radiating to her left shoulder towards the end of her 4-hour dialysis session today.  She states she has never gotten chest pain before during dialysis.  She states in 2009 she had a mild heart attack.  Patient received aspirin by EMS.  Patient states her symptoms have slowly been improving since 4 PM.  Pain is currently a 9/10.  Past Medical History:  Diagnosis Date  . Anemia   . Aortic stenosis   . Bacterial sinusitis 09/17/2011  . CHF (congestive heart failure) ([place])   . CKD (chronic kidney disease) stage 4, GFR 15-29 ml/min ([place]) 08/11/2006   Cr continues to increase. Proteinuria on UA 02/10/12.    . Colitis   . CVA (cerebrovascular accident) [facility])    New hemorrhagic per CT scan '09  . Diverticulosis of colon   . Dysfunctional uterine bleeding   . Fecal impaction ([place])   . Headache(784.0)   . Heart murmur   . HERNIORRHAPHY, HX OF 08/11/2006  . Hypertension   .  OA (osteoarthritis)    bilateral knees  . Postmenopausal   . Pulmonary nodule   . TINEA CRURIS 01/12/2007    Patient Active Problem List   Diagnosis Date Noted  . Left arm pain 07/22/2017  . Chest pain 07/08/2017  . ESRD (end stage renal disease) ([place]) 04/23/2017  . Symptomatic anemia 02/17/2017  . Secondary hyperparathyroidism of renal origin ([place]) 01/15/2017  . Tachycardia   . Mitral valve annular calcification   . Malnutrition of moderate degree 12/09/2016  . Dizziness 12/08/2016  . Constipation 10/15/2016  . Angina at rest [facility])   . Goals of care, counseling/discussion   . Palliative care encounter   . Metabolic acidosis 47/82/9562  . Aortic stenosis 05/21/2016  . Anemia associated with chronic renal failure 02/01/2016  . Atherosclerosis of aorta ([place]) 01/11/2015  . End stage renal disease ([place]) 02/04/2013  . Non-intractable vomiting with nausea 08/17/2012  . Glaucoma 03/18/2012  . Health care maintenance 09/17/2011  . Osteoarthrosis involving lower leg 10/31/2008  . History of CVA (cerebrovascular accident) 01/28/2008  . Hyperlipidemia 02/13/2007  . PULMONARY NODULES 01/12/2007  . Left ventricular hypertrophy 09/02/2006  . Essential hypertension 08/11/2006  . GERD 08/11/2006  . Diverticulitis of colon 08/11/2006    Past Surgical History:  Procedure Laterality Date  . ABDOMINAL HYSTERECTOMY    . AV FISTULA PLACEMENT Left 02/19/2017   Procedure: CREATION OF LEFT ARM BRACHIOCEPHALIC ARTERIOVENOUS (  AV) FISTULA;  Surgeon: Rosetta Posner, MD;  Location: [place];  Service: Vascular;  Laterality: Left;  . BASCILIC VEIN TRANSPOSITION Left 04/23/2017   Procedure: BASILIC VEIN TRANSPOSITION SECOND STAGE;  Surgeon: Rosetta Posner, MD;  Location: [place];  Service: Vascular;  Laterality: Left;  . CHOLECYSTECTOMY  2009  . COLONOSCOPY    . INGUINAL HERNIA REPAIR  2008  . INSERTION OF DIALYSIS CATHETER Right 02/19/2017   Procedure: INSERTION OF TUNNELED DIALYSIS CATHETER - RIGHT  INTERNAL JUGULAR PLACEMENT;  Surgeon: Rosetta Posner, MD;  Location: [place];  Service: Vascular;  Laterality: Right;  . IRIDOTOMY / IRIDECTOMY     Laser, right eye 12/26/11 left eye 01/24/12  . MASS EXCISION Left 05/07/2013   Procedure: EXCISION CYST;  Surgeon: Myrtha Mantis., MD;  Location: [place];  Service: Ophthalmology;  Laterality: Left;    OB History    No data available       Home Medications    Prior to Admission medications   Medication Sig Start Date End Date Taking? Authorizing Provider  acetaminophen (TYLENOL) 500 MG tablet Take 2 tablets (1,000 mg total) by mouth every 8 (eight) hours as needed for mild pain, moderate pain, fever or headache. 07/22/17   Colbert Ewing, MD  amLODipine (NORVASC) 10 MG tablet Take 1 tablet (10 mg total) by mouth daily. Patient not taking: Reported on 07/17/2017 06/10/17   Sid Falcon, MD  aspirin EC 81 MG EC tablet Take 1 tablet (81 mg total) by mouth daily. 12/10/16   Valinda Party, DO  calcitRIOL (ROCALTROL) 0.25 MCG capsule Take 1 capsule (0.25 mcg total) by mouth daily. 01/15/17   Sid Falcon, MD  calcium acetate (PHOSLO) 667 MG capsule Take 2 capsules (1,334 mg total) by mouth 3 (three) times daily with meals. Patient not taking: Reported on 07/17/2017 02/22/17   Melanee Spry, MD  carvedilol (COREG) 12.5 MG tablet Take 12.5 mg by mouth 2 (two) times daily. 02/17/17   [provider]  cloNIDine (CATAPRES - DOSED IN MG/24 HR) 0.2 mg/24hr patch Place 1 patch (0.2 mg total) onto the skin every 7 (seven) days. 07/09/17   Sid Falcon, MD  ferric citrate (AURYXIA) 1 GM 210 MG(Fe) tablet Take 210 mg by mouth daily.    [provider]  nitroGLYCERIN (NITROSTAT) 0.3 MG SL tablet Place 1 tablet (0.3 mg total) under the tongue every 5 (five) minutes as needed for chest pain. Patient not taking: Reported on 07/17/2017 02/27/17   Sid Falcon, MD  omeprazole (PRILOSEC) 20 MG capsule Take 1  capsule (20 mg total) by mouth daily. Patient not taking: Reported on 07/17/2017 07/09/17 08/08/17  Lars Mage, MD  SENSIPAR 60 MG tablet Take 1 tablet (60 mg total) by mouth daily. 01/15/17   Sid Falcon, MD  sevelamer carbonate (RENVELA) 800 MG tablet Take 800 mg by mouth 3 (three) times daily. 02/10/17   [provider]  traMADol (ULTRAM) 50 MG tablet Take 1 tablet (50 mg total) by mouth every 6 (six) hours as needed for severe pain. Patient not taking: Reported on 07/17/2017 04/24/17   Gabriel Earing, PA-C  traMADol (ULTRAM) 50 MG tablet Take 1 tablet (50 mg total) by mouth every 6 (six) hours as needed for severe pain. Patient not taking: Reported on 07/08/2017 04/24/17   Ulyses Amor, PA-C    Family History Family History  Problem Relation Age of Onset  . Hypertension Mother   .  Congestive Heart Failure Mother   . Heart attack Brother 57    Social History Social History   Tobacco Use  . Smoking status: Never Smoker  . Smokeless tobacco: Never Used  Substance Use Topics  . Alcohol use: No    Alcohol/week: 0.0 oz  . Drug use: No    Comment: 08/15/08 UDS + cocaine     Allergies   Hydrocodone-acetaminophen   Review of Systems Review of Systems  Constitutional: Negative for chills, diaphoresis and fever.  HENT: Negative for sore throat.   Eyes: Negative for visual disturbance.  Respiratory: Negative for cough, sputum production and shortness of breath.   Cardiovascular: Positive for chest pain. Negative for palpitations, leg swelling and near-syncope.  Gastrointestinal: Positive for nausea. Negative for abdominal pain and vomiting.  Genitourinary: Negative for dysuria.  Musculoskeletal: Negative for back pain.  Skin: Negative for rash.  Neurological: Positive for light-headedness. Negative for syncope and headaches.  All other systems reviewed and are negative.    Physical Exam Updated Vital Signs BP (!) 115/51   Pulse 84   Temp 98.5 F (36.9 C)  (Oral)   Resp 13   Ht 5\' 2"  (1.575 m)   Wt 59 kg (130 lb)   SpO2 99%   BMI 23.78 kg/m   Physical Exam  Constitutional: She is oriented to person, place, and time. She appears well-developed and well-nourished. She does not appear ill. No distress.  HENT:  Head: Normocephalic and atraumatic.  Eyes: Conjunctivae are normal.  Neck: Neck supple.  Cardiovascular: Normal rate, regular rhythm, intact distal pulses and normal pulses. Exam reveals no friction rub.  Murmur heard.  Systolic murmur is present with a grade of 3/6. Crescendo decrescendo systolic murmur  Pulmonary/Chest: Effort normal and breath sounds normal. No respiratory distress. She has no decreased breath sounds. She has no wheezes. She has no rales.  Abdominal: Soft. There is no tenderness.  Musculoskeletal: She exhibits no edema.       Right lower leg: She exhibits no edema.       Left lower leg: She exhibits no edema.  Neurological: She is alert and oriented to person, place, and time.  Skin: Skin is warm and dry.  Psychiatric: She has a normal mood and affect.  Nursing note and vitals reviewed.    ED Treatments / Results  Labs (all labs ordered are listed, but only abnormal results are displayed) Labs Reviewed  BASIC METABOLIC PANEL - Abnormal; Notable for the following components:      Result Value   Chloride 97 (*)    Glucose, Bld 114 (*)    BUN 28 (*)    Creatinine, Ser 4.59 (*)    GFR calc non Af Amer 9 (*)    GFR calc Af Amer 10 (*)    All other components within normal limits  CBC - Abnormal; Notable for the following components:   RBC 3.43 (*)    Hemoglobin 10.7 (*)    HCT 33.4 (*)    RDW 16.5 (*)    All other components within normal limits  I-STAT TROPONIN, ED  I-STAT TROPONIN, ED    EKG  EKG Interpretation  Date/Time:  Friday September 19 2017 21:31:56 EDT Ventricular Rate:  86 PR Interval:    QRS Duration: 112 QT Interval:  388 QTC Calculation: 465 R Axis:   34 Text Interpretation:   Sinus rhythm Incomplete left bundle branch block Consider anterolateral infarct No significant change since last tracing Confirmed by Addison Lank (  [id number]) on 09/20/2017 12:47:45 AM       Radiology Dg Chest 2 View  Result Date: 09/19/2017 CLINICAL DATA:  Chest pain EXAM: CHEST - 2 VIEW COMPARISON:  07/08/2017 chest radiograph. FINDINGS: Stable cardiomediastinal silhouette with top-normal heart size. No pneumothorax. No pleural effusion. Lungs appear clear, with no acute consolidative airspace disease and no pulmonary edema. Cholecystectomy clips are seen in the right upper quadrant of the abdomen. Surgical clips overlie the left axilla. IMPRESSION: No active cardiopulmonary disease. Electronically Signed   By: Ilona Sorrel M.D.   On: 09/19/2017 22:01    Procedures Procedures (including critical care time)  Medications Ordered in ED Medications  sodium chloride 0.9 % bolus 250 mL (0 mLs Intravenous Stopped 09/19/17 2359)  morphine 4 MG/ML injection 2 mg (2 mg Intravenous Given 09/19/17 2233)     Initial Impression / Assessment and Plan / ED Course  I have reviewed the triage vital signs and the nursing notes.  Pertinent labs & imaging results that were available during my care of the patient were reviewed by me and considered in my medical decision making (see chart for details).     Patient is a 77 year old female with history of CVA, ESRD on HD, CHF, aortic stenosis, hypertension who presents with severe chest pain radiating to the left shoulder towards the end of her dialysis session today.  The symptoms are new for her.  Symptoms have been ongoing since 4 PM for about the last 5 or 6 hours.  Patient received aspirin prior to arrival.  EKG here shows sinus rhythm with incomplete left bundle branch block.  There is nonspecific T wave and ST changes on the EKG that are unchanged from prior except for mild ST depression and aVF compared to prior.  First troponin is negative.  Other labs  as above does show any acute findings.  On exam patient does have reproducible chest pain.  Blood pressure noted to be 90s over 50s with a map less than 65.  Likely she has fluid down after dialysis.  Patient given 250 cc of fluid which improved her blood pressure to 629 systolic with a map over 65.  Low-dose morphine given for the chest pain with minimal improvement.  Holding off on nitroglycerin given her borderline maps.  Medicine consulted for admission for further ACS evaluation.  Final Clinical Impressions(s) / ED Diagnoses   Final diagnoses:  Chest pain, unspecified type    ED Discharge Orders    None       Tobie Poet, DO 09/20/17 [id number]

## 2017-09-19 NOTE — ED Notes (Signed)
Pt states she does not make urine 

## 2017-09-19 NOTE — ED Triage Notes (Signed)
Pt arrives to ED from home with complaints of non-radiating sharp centralized 10/10 CP since 1530 this afternoon. EMS reports pt began having cp after finishing dialysis. Pt given 324 asa en route for cp with no change. Pt also complains of headache and generalized weakness but worse on the left. Pt placed in position of comfort with bed locked and lowered, call bell in reach.

## 2017-09-19 NOTE — ED Notes (Signed)
Pt states she needs to have a BM, offered a bedpan and pt refused. Pt is fall risk and orthostatic with position changes. Pt will notify staff if she would like to use the bedpan in the future. Call bell in reach, will continue to monitor

## 2017-09-19 NOTE — ED Notes (Signed)
ED Provider at bedside. 

## 2017-09-20 DIAGNOSIS — Z885 Allergy status to narcotic agent status: Secondary | ICD-10-CM | POA: Diagnosis not present

## 2017-09-20 DIAGNOSIS — Z8249 Family history of ischemic heart disease and other diseases of the circulatory system: Secondary | ICD-10-CM

## 2017-09-20 DIAGNOSIS — I959 Hypotension, unspecified: Secondary | ICD-10-CM

## 2017-09-20 DIAGNOSIS — I350 Nonrheumatic aortic (valve) stenosis: Secondary | ICD-10-CM

## 2017-09-20 DIAGNOSIS — I050 Rheumatic mitral stenosis: Secondary | ICD-10-CM

## 2017-09-20 DIAGNOSIS — N186 End stage renal disease: Secondary | ICD-10-CM

## 2017-09-20 DIAGNOSIS — I120 Hypertensive chronic kidney disease with stage 5 chronic kidney disease or end stage renal disease: Secondary | ICD-10-CM

## 2017-09-20 DIAGNOSIS — R0789 Other chest pain: Secondary | ICD-10-CM

## 2017-09-20 DIAGNOSIS — Z992 Dependence on renal dialysis: Secondary | ICD-10-CM | POA: Diagnosis not present

## 2017-09-20 DIAGNOSIS — I878 Other specified disorders of veins: Secondary | ICD-10-CM | POA: Diagnosis not present

## 2017-09-20 DIAGNOSIS — R072 Precordial pain: Secondary | ICD-10-CM | POA: Diagnosis not present

## 2017-09-20 DIAGNOSIS — R252 Cramp and spasm: Secondary | ICD-10-CM

## 2017-09-20 DIAGNOSIS — Z7982 Long term (current) use of aspirin: Secondary | ICD-10-CM

## 2017-09-20 DIAGNOSIS — R011 Cardiac murmur, unspecified: Secondary | ICD-10-CM

## 2017-09-20 LAB — TROPONIN I
Troponin I: <0.03 ng/mL
Troponin I: <0.03 ng/mL

## 2017-09-20 MED ORDER — ACETAMINOPHEN 650 MG RE SUPP: 650.0000 mg | Freq: Four times a day (QID) | RECTAL | Status: DC | PRN

## 2017-09-20 MED ORDER — HEPARIN SODIUM (PORCINE) 5000 UNIT/ML IJ SOLN: 5000.0000 [IU] | Freq: Three times a day (TID) | INTRAMUSCULAR | Status: DC

## 2017-09-20 MED ORDER — ACETAMINOPHEN 325 MG PO TABS: 650.0000 mg | ORAL_TABLET | Freq: Four times a day (QID) | ORAL | Status: DC | PRN

## 2017-09-20 MED ORDER — SENNOSIDES-DOCUSATE SODIUM 8.6-50 MG PO TABS: 1.0000 | ORAL_TABLET | Freq: Every evening | ORAL | Status: DC | PRN

## 2017-09-20 MED ORDER — AMLODIPINE BESYLATE 10 MG PO TABS: ORAL_TABLET | ORAL | 5 refills | Status: DC

## 2017-09-20 MED ORDER — ASPIRIN 81 MG PO CHEW
81.0000 mg | CHEWABLE_TABLET | Freq: Every day | ORAL | Status: DC
  Administered 2017-09-20: 81 mg via ORAL
  Filled 2017-09-20: qty 1

## 2017-09-20 NOTE — Discharge Summary (Signed)
Name: Nancy [name] MRN: [id number] DOB: [birth year] 77 y.o. PCP: Aldine Contes, MD  Date of Admission: 09/19/2017  9:20 PM Date of Discharge:  Attending Physician: Aldine Contes, MD  Discharge Diagnosis: 1. Atypical Chest Pain 2. ESRD on HD 3. Muscle cramping  Active Problems:   Chest pain  Discharge Medications: Allergies as of 09/20/2017      Reactions   Hydrocodone-acetaminophen Nausea And Vomiting, Other (See Comments)   Dizziness (also)      Medication List    STOP taking these medications   sevelamer carbonate 800 MG tablet Commonly known as:  RENVELA   traMADol 50 MG tablet Commonly known as:  ULTRAM     TAKE these medications   acetaminophen 500 MG tablet Commonly known as:  TYLENOL Take 2 tablets (1,000 mg total) by mouth every 8 (eight) hours as needed for mild pain, moderate pain, fever or headache.   amLODipine 10 MG tablet Commonly known as:  NORVASC Take 1/2 tablet by mouth according to prior schedule What changed:    how much to take  how to take this  when to take this  additional instructions   aspirin 81 MG EC tablet Take 1 tablet (81 mg total) by mouth daily.   AURYXIA 1 GM 210 MG(Fe) tablet Generic drug:  ferric citrate Take 210 mg by mouth daily.   calcitRIOL 0.25 MCG capsule Commonly known as:  ROCALTROL Take 1 capsule (0.25 mcg total) by mouth daily.   calcium acetate 667 MG capsule Commonly known as:  PHOSLO Take 2 capsules (1,334 mg total) by mouth 3 (three) times daily with meals. What changed:  how much to take   cloNIDine 0.2 mg/24hr patch Commonly known as:  CATAPRES - Dosed in mg/24 hr Place 1 patch (0.2 mg total) onto the skin every 7 (seven) days.   nitroGLYCERIN 0.3 MG SL tablet Commonly known as:  NITROSTAT Place 1 tablet (0.3 mg total) under the tongue every 5 (five) minutes as needed for chest pain.   SENSIPAR 60 MG tablet Generic drug:  cinacalcet Take 1 tablet (60 mg total) by mouth daily.      Disposition and follow-up:   Nancy [name] was discharged from [facility] in stable condition.  At the hospital follow up visit please address:  1.  Atypical Chest Pain/Cramping: Address any recurrent episodes of chest pain or cramping. Tylenol was recommended as needed.   ESRD on HD MWF: Patient was instructed to follow-up closely with her nephrologist and discuss how to better manage the cramping. Please encourage this.   HTN/Hypotension: Amlodipine decreased to 5mg  at dc. Clonidine patch continued. Please discuss and clarify her Amlodipine regimen (MAR states given with HD). She may benefit from less aggressive BP control to better tolerate HD.   2.  Labs / imaging needed at time of follow-up: Blood pressure, Renal panel  3.  Pending labs/ test needing follow-up: none  Follow-up Appointments: She has follow-up appointment scheduled with her PCP 3/26.    Follow-up Information    Aldine Contes, MD Follow up.   Specialty:  Internal Medicine Contact information: [place], [address] [place] [id number] [place] [facility] Course by problem list: Active Problems:   Chest pain   1. Atypical Chest pain Ms. Nancy B. Dadamo is a 77 y/o F with ESRD on HD MWF, aortic and mitral stenosis, HTN and prior history of CVA who presented 3/15  for evaluation of cramping following hypotension with HD. Cramping began about 30 minutes prior to end of her session and started in her chest. It then migrated to both arms then abdomen and both legs. She denied any chest pressure, shortness of breath, nausea/vomiting, palpitations or diaphoresis. She has been having issues with cramping towards the end of her HD treatments recently however has never started in the chest nor did it last that long. Her symptoms resolved and complained of muscle soreness in her chest, arms and legs at time of discharge. Her EKG was without acute ST/T-wave changes and  troponins were cycled which were negative x3. She was discharged home in stable condition with conservative treatment with tylenol as needed for muscle pain. She was asked to discuss the cramping episodes with her Nephrologist at her next HD session.   2. ESRD on HD Patient did not require HD during her hospitalization. She will resume her usual HD session and discuss cramping episodes and hypotension with her primary nephrologist at [facility].   3. HTN Amlodipine 10mg  and clonidine patch at home. Given her history of hypotension, her anti-hypertensive regimen was adjusted to Amlodipine 5mg  and continuing clonidine.   Discharge Vitals:   BP (!) 113/32   Pulse 73   Temp 98.5 F (36.9 C) (Oral)   Resp 13   Ht 5\' 2"  (1.575 m)   Wt 130 lb (59 kg)   SpO2 100%   BMI 23.78 kg/m   Pertinent Labs, Studies, and Procedures:  Troponins negative x3 EKG with NSR, no acute ST segment or T-wave changes  Signed: Gerline Ratto, DO 09/20/2017, 12:23 PM   Pager: [phone]

## 2017-09-20 NOTE — Discharge Instructions (Signed)
[name], Nancy [name] were admitted for evaluation of chest pain. You do not have any signs that you have acute damage to your heart.  Your chest pain is most likely related to the cramping you experienced after HD.  You can take OTC Tylenol as needed for pain. Please continue your medications as you were prior to admission and please continue following with nephrology regularly.  You will need to discuss the cramping with your kidney doctor. Sometimes they can help you ease this.  Please follow-up at your scheduled appointment with Dr. Dareen Piano 3/26 in the Internal Medicine Clinic at [facility]!

## 2017-09-20 NOTE — ED Notes (Signed)
Pt. Talking to daughter on the phone. Pt. Resting.

## 2017-09-20 NOTE — ED Notes (Signed)
Pt. Stated, I just feel a little weak.

## 2017-09-20 NOTE — H&P (Addendum)
Date: 09/20/2017               Patient Name:  Nancy [name] MRN: [id number]  DOB: 1940/09/07 Age / Sex: 77 y.o., female   PCP: Aldine Contes, MD         Medical Service: Internal Medicine Teaching Service         Attending Physician: Dr. Aldine Contes, MD    First Contact: Dr. Arvil Chaco Pager: [phone]  Second Contact: Dr. Einar Gip Pager: [phone]       After Hours (After 5p/  First Contact Pager: [phone]  weekends / holidays): Second Contact Pager: [phone]   Chief Complaint: cramping pain in legs  History of Present Illness: Patient is a 77 year old female with past medical history significant for ESRD on Monday Wednesday Friday dialysis, aortic stenosis, mitral stenosis.  Patient was sent to the ED from dialysis, she had to end her session 30 minutes before it was over due to cramping and hypotension.  She said she first felt the cramping pain in her chest adjacent to the left sternal border this is where it was most intense she also felt cramping in her bilateral arms followed by her legs and abdomen.  This is been a recurrent problem for her but this time she states that it felt worse than it has in the past and has persisted longer hours versus less than an hour.  She reports feeling very lightheaded noticed palpitations and that her heart was beating abnormally fast, she felt as if she broke out into a sweat.  She denies the pain was radiating and no pain in jaw or neck.  It was associated with nausea but no vomiting, it was not associated with shortness of breath.  Her complaint during our interview was mainly cramping pain in her legs bilaterally.  She reports trying to drink less and less fluid due to being told at dialysis that she has too much fluid on board I had difficulty getting her to quantify it, she reports no other dietary changes.    She has not been ill recently, has had no recent diarrhea or vomiting or other sources of fluid losses.    ED course: pt  given morphine IV and 250cc bolus of NS. CBC and BMP were unremarkable. Chest x-ray was negative for acute process, EKG showed chronic nonspecific ST changes consistent with LVH, troponin was negative.    Meds:  No outpatient medications have been marked as taking for the 09/19/17 encounter [facility] Encounter).     Allergies: Allergies as of 09/19/2017 - Review Complete 09/19/2017  Allergen Reaction Noted  . Hydrocodone-acetaminophen Nausea And Vomiting and Other (See Comments)    Past Medical History:  Diagnosis Date  . Anemia   . Aortic stenosis   . Bacterial sinusitis 09/17/2011  . CHF (congestive heart failure) ([place])   . CKD (chronic kidney disease) stage 4, GFR 15-29 ml/min ([place]) 08/11/2006   Cr continues to increase. Proteinuria on UA 02/10/12.    . Colitis   . CVA (cerebrovascular accident) [facility])    New hemorrhagic per CT scan '09  . Diverticulosis of colon   . Dysfunctional uterine bleeding   . Fecal impaction ([place])   . Headache(784.0)   . Heart murmur   . HERNIORRHAPHY, HX OF 08/11/2006  . Hypertension   . OA (osteoarthritis)    bilateral knees  . Postmenopausal   . Pulmonary nodule   . TINEA CRURIS 01/12/2007    Family History:  Family History  Problem Relation Age of Onset  . Hypertension Mother   . Congestive Heart Failure Mother   . Heart attack Brother 40    Social History:  Social History   Socioeconomic History  . Marital status: Married    Spouse name: Not on file  . Number of children: Not on file  . Years of education: Not on file  . Highest education level: Not on file  Social Needs  . Financial resource strain: Not on file  . Food insecurity - worry: Not on file  . Food insecurity - inability: Not on file  . Transportation needs - medical: Not on file  . Transportation needs - non-medical: Not on file  Occupational History  . Not on file  Tobacco Use  . Smoking status: Never Smoker  . Smokeless tobacco: Never Used  Substance and Sexual  Activity  . Alcohol use: No    Alcohol/week: 0.0 oz  . Drug use: No    Comment: 08/15/08 UDS + cocaine  . Sexual activity: Not Currently  Other Topics Concern  . Not on file  Social History Narrative   Married, lives with her husband. 1 child.         no alcohol Never smoker  Review of Systems: A complete ROS was negative except as per HPI.   Physical Exam: Blood pressure (!) 114/53, pulse 87, temperature 98.5 F (36.9 C), temperature source Oral, resp. rate 17, height 5\' 2"  (1.575 m), weight 130 lb (59 kg), SpO2 100 %. Physical Exam  Constitutional: She is oriented to person, place, and time. No distress.  HENT:  Head: Normocephalic and atraumatic.  Neck: JVD present.  Cardiovascular: Normal rate and regular rhythm. Exam reveals no gallop and no friction rub.  Murmur heard.  Systolic (RUSB) murmur is present with a grade of 3/6. Extremely tender to palpation over anterior chest wall adjacent to left sternal border  Pulmonary/Chest: Effort normal and breath sounds normal. No respiratory distress. She has no wheezes. She has no rales. She exhibits no tenderness.  Abdominal: Soft. Bowel sounds are normal. She exhibits no distension and no mass. There is no tenderness. There is no rebound and no guarding.  Musculoskeletal:       Right lower leg: She exhibits no edema.       Left lower leg: She exhibits no edema.  Neurological: She is alert and oriented to person, place, and time.  Skin: She is not diaphoretic.  Psychiatric: Her mood appears not anxious. She is not agitated.    EKG: personally reviewed my interpretation is sinus rhythm, ST changes associated with LVH  CXR: personally reviewed my interpretation is no acute cardiopulmonary process  Assessment & Plan by Problem: Active Problems:   Chest pain  Atypical chest pain: this is a chronic problem, is associated with fluid removal during dialysis and usually resolves shortly afterwards.  Her pain persisted on this  occasion and was cramping in nature and reproducible.  Her pain is however not in her chest alone and she has had systemic cramping since her hypotensive episode during dialysis.  ACS workup negative thus far.  There is clearly an element of muscular cramping pain, concomitant angina from the hypotensive episode remains in the differential as well as the patient has moderate AS and almost certainly has CAD although no LHC in past or cardiac imaging other than ECHO.    -will continue troponins and repeat EKG -continuous cardiac monitoring -pain with mild improvement with fluids but  would be hesitant to give more after clinical exam -will hold off on pain meds as pt received morphine in ED and this is likely transient -continue home asa  HTN: chronic, stable  -will hold all antihypertensives for now as pt with low bp  ESRD: on MWF dialysis last session 3/15,  finished all but 30 minutes  -no emergent need for dialysis and will likely be discharged before her next regularly scheduled session   Dispo: Admit patient to Observation with expected length of stay less than 2 midnights.  Signed: Katherine Roan, MD 09/20/2017, 1:18 AM  Vickki Muff MD PGY-1 Internal Medicine

## 2017-09-20 NOTE — ED Notes (Signed)
Pt denies any chest pain, states "I am ready to go home" will notify physician.

## 2017-09-20 NOTE — ED Notes (Signed)
Pt. Ate 75% of breakfast.

## 2017-09-20 NOTE — Progress Notes (Signed)
   Subjective: Seen and evaluated in ED this morning. She feels better than on admission but still has some generalized muscle weakness from cramping. She denied SOB.   Objective:  Vital signs in last 24 hours: Vitals:   09/20/17 0100 09/20/17 0200 09/20/17 0425 09/20/17 0600  BP: (!) 114/53 (!) 118/39 (!) 114/21 (!) 113/32  Pulse: 87 82 79 73  Resp: 17 15 13 13   Temp:      TempSrc:      SpO2: 100% 100% 100% 100%  Weight:      Height:       General: Pleasant elderly AA woman resting comfortably in bed. NAD. HENT: No conjunctival injection, icterus or ptosis. Oropharynx clear, mucous membranes moist.  Cardiovascular: Regular rate and rhythm. Systolic murmur 3/6 best heard RUSB Pulmonary: CTA BL. Unlabored breathing.  Abdomen: Soft, non-tender and non-distended. No guarding or rigidity. +bowel sounds.  Extremities: No peripheral edema noted BL. Intact distal pulses. No gross deformities. Skin: Warm, dry. No cyanosis.  Psych: Mood normal and affect was mood congruent. Responds to questions appropriately.   Assessment/Plan:  Active Problems:   Chest pain  Atypical Chest Pain EKG without new changes to suggest ischemia and Troponins are negative x2 so far, 3rd one is in process. Her pain is reproducible on palpation and is most likely musculoskeletal in nature related to her muscle cramping. Cramping most likely due to fluid shifts and electrolyte changes during HD sessions, which will need to be addressed with her outpatient nephrologist.  -Follow-up repeat troponin, if continues to be negative will discharge -Continue ASA -Has outpatient follow-up already scheduled with PCP 3/26.   ESRD on HD Patient compliant with HD sessions. Has experienced cramping about 30 minutes before end of treatment but this lasted a bit longer. She will need to discuss this with her nephrologist to help ease cramping going forward.   HTN Blood pressure meds held on admit due to soft/normal BP.    Dispo: Anticipated discharge today.   Nancy Sheckler, Nancy [name] 09/20/2017, 11:50 AM Pager: [phone]

## 2017-09-20 NOTE — ED Provider Notes (Signed)
I have personally seen and examined the patient. I have reviewed the documentation on PMH/FH/Soc Hx. I have discussed the plan of care with the resident and patient.  I have reviewed and agree with the resident's documentation. Please see associated encounter note.  Briefly, the patient is a 77 y.o. female here with chest pain after having dialysis.  This is new for the patient.  Appears to be atypical in nature.  Patient was noted to have soft blood pressures upon arrival.  Likely due to over [place].  Improved with small IV fluid bolus.  EKG without acute ischemic changes or evidence of pericarditis.  Initial troponin negative.  Labs at patient's baseline and grossly reassuring. Chest x-ray without evidence suggestive of pneumonia, pneumothorax, pneumomediastinum.  No abnormal contour of the mediastinum to suggest dissection. No evidence of acute injuries.   Chest pain likely demand from over diuresis from dialysis however patient is high risk and will require admission for ACS rule out.   EKG Interpretation  Date/Time:  Friday September 19 2017 21:31:56 EDT Ventricular Rate:  86 PR Interval:    QRS Duration: 112 QT Interval:  388 QTC Calculation: 465 R Axis:   34 Text Interpretation:  Sinus rhythm Incomplete left bundle branch block Consider anterolateral infarct No significant change since last tracing Confirmed by Addison Lank [phone]) on 09/20/2017 12:47:45 AM         Cardama, Grayce Sessions, MD 09/20/17 [phone]

## 2017-09-22 DIAGNOSIS — D689 Coagulation defect, unspecified: Secondary | ICD-10-CM | POA: Diagnosis not present

## 2017-09-22 DIAGNOSIS — D631 Anemia in chronic kidney disease: Secondary | ICD-10-CM | POA: Diagnosis not present

## 2017-09-22 DIAGNOSIS — N186 End stage renal disease: Secondary | ICD-10-CM | POA: Diagnosis not present

## 2017-09-22 DIAGNOSIS — R51 Headache: Secondary | ICD-10-CM | POA: Diagnosis not present

## 2017-09-22 DIAGNOSIS — N2581 Secondary hyperparathyroidism of renal origin: Secondary | ICD-10-CM | POA: Diagnosis not present

## 2017-09-22 DIAGNOSIS — D509 Iron deficiency anemia, unspecified: Secondary | ICD-10-CM | POA: Diagnosis not present

## 2017-09-24 DIAGNOSIS — N186 End stage renal disease: Secondary | ICD-10-CM | POA: Diagnosis not present

## 2017-09-24 DIAGNOSIS — R51 Headache: Secondary | ICD-10-CM | POA: Diagnosis not present

## 2017-09-24 DIAGNOSIS — D509 Iron deficiency anemia, unspecified: Secondary | ICD-10-CM | POA: Diagnosis not present

## 2017-09-24 DIAGNOSIS — D631 Anemia in chronic kidney disease: Secondary | ICD-10-CM | POA: Diagnosis not present

## 2017-09-24 DIAGNOSIS — D689 Coagulation defect, unspecified: Secondary | ICD-10-CM | POA: Diagnosis not present

## 2017-09-24 DIAGNOSIS — N2581 Secondary hyperparathyroidism of renal origin: Secondary | ICD-10-CM | POA: Diagnosis not present

## 2017-09-26 DIAGNOSIS — R51 Headache: Secondary | ICD-10-CM | POA: Diagnosis not present

## 2017-09-26 DIAGNOSIS — N186 End stage renal disease: Secondary | ICD-10-CM | POA: Diagnosis not present

## 2017-09-26 DIAGNOSIS — D509 Iron deficiency anemia, unspecified: Secondary | ICD-10-CM | POA: Diagnosis not present

## 2017-09-26 DIAGNOSIS — D631 Anemia in chronic kidney disease: Secondary | ICD-10-CM | POA: Diagnosis not present

## 2017-09-26 DIAGNOSIS — D689 Coagulation defect, unspecified: Secondary | ICD-10-CM | POA: Diagnosis not present

## 2017-09-26 DIAGNOSIS — N2581 Secondary hyperparathyroidism of renal origin: Secondary | ICD-10-CM | POA: Diagnosis not present

## 2017-09-29 DIAGNOSIS — N2581 Secondary hyperparathyroidism of renal origin: Secondary | ICD-10-CM | POA: Diagnosis not present

## 2017-09-29 DIAGNOSIS — D509 Iron deficiency anemia, unspecified: Secondary | ICD-10-CM | POA: Diagnosis not present

## 2017-09-29 DIAGNOSIS — D689 Coagulation defect, unspecified: Secondary | ICD-10-CM | POA: Diagnosis not present

## 2017-09-29 DIAGNOSIS — R51 Headache: Secondary | ICD-10-CM | POA: Diagnosis not present

## 2017-09-29 DIAGNOSIS — D631 Anemia in chronic kidney disease: Secondary | ICD-10-CM | POA: Diagnosis not present

## 2017-09-29 DIAGNOSIS — N186 End stage renal disease: Secondary | ICD-10-CM | POA: Diagnosis not present

## 2017-09-30 ENCOUNTER — Ambulatory Visit: Payer: Medicare Other | Admitting: Internal Medicine

## 2017-10-01 DIAGNOSIS — D631 Anemia in chronic kidney disease: Secondary | ICD-10-CM | POA: Diagnosis not present

## 2017-10-01 DIAGNOSIS — R51 Headache: Secondary | ICD-10-CM | POA: Diagnosis not present

## 2017-10-01 DIAGNOSIS — N186 End stage renal disease: Secondary | ICD-10-CM | POA: Diagnosis not present

## 2017-10-01 DIAGNOSIS — N2581 Secondary hyperparathyroidism of renal origin: Secondary | ICD-10-CM | POA: Diagnosis not present

## 2017-10-01 DIAGNOSIS — D509 Iron deficiency anemia, unspecified: Secondary | ICD-10-CM | POA: Diagnosis not present

## 2017-10-01 DIAGNOSIS — D689 Coagulation defect, unspecified: Secondary | ICD-10-CM | POA: Diagnosis not present

## 2017-10-03 DIAGNOSIS — N2581 Secondary hyperparathyroidism of renal origin: Secondary | ICD-10-CM | POA: Diagnosis not present

## 2017-10-03 DIAGNOSIS — D631 Anemia in chronic kidney disease: Secondary | ICD-10-CM | POA: Diagnosis not present

## 2017-10-03 DIAGNOSIS — D509 Iron deficiency anemia, unspecified: Secondary | ICD-10-CM | POA: Diagnosis not present

## 2017-10-03 DIAGNOSIS — R51 Headache: Secondary | ICD-10-CM | POA: Diagnosis not present

## 2017-10-03 DIAGNOSIS — D689 Coagulation defect, unspecified: Secondary | ICD-10-CM | POA: Diagnosis not present

## 2017-10-03 DIAGNOSIS — N186 End stage renal disease: Secondary | ICD-10-CM | POA: Diagnosis not present

## 2017-10-06 DIAGNOSIS — D509 Iron deficiency anemia, unspecified: Secondary | ICD-10-CM | POA: Diagnosis not present

## 2017-10-06 DIAGNOSIS — I150 Renovascular hypertension: Secondary | ICD-10-CM | POA: Diagnosis not present

## 2017-10-06 DIAGNOSIS — N2581 Secondary hyperparathyroidism of renal origin: Secondary | ICD-10-CM | POA: Diagnosis not present

## 2017-10-06 DIAGNOSIS — Z992 Dependence on renal dialysis: Secondary | ICD-10-CM | POA: Diagnosis not present

## 2017-10-06 DIAGNOSIS — N186 End stage renal disease: Secondary | ICD-10-CM | POA: Diagnosis not present

## 2017-10-06 DIAGNOSIS — D689 Coagulation defect, unspecified: Secondary | ICD-10-CM | POA: Diagnosis not present

## 2017-10-06 DIAGNOSIS — D631 Anemia in chronic kidney disease: Secondary | ICD-10-CM | POA: Diagnosis not present

## 2017-10-07 DIAGNOSIS — T82898A Other specified complication of vascular prosthetic devices, implants and grafts, initial encounter: Secondary | ICD-10-CM | POA: Diagnosis not present

## 2017-10-07 DIAGNOSIS — Z992 Dependence on renal dialysis: Secondary | ICD-10-CM | POA: Diagnosis not present

## 2017-10-07 DIAGNOSIS — N186 End stage renal disease: Secondary | ICD-10-CM | POA: Diagnosis not present

## 2017-10-08 DIAGNOSIS — N186 End stage renal disease: Secondary | ICD-10-CM | POA: Diagnosis not present

## 2017-10-08 DIAGNOSIS — D689 Coagulation defect, unspecified: Secondary | ICD-10-CM | POA: Diagnosis not present

## 2017-10-08 DIAGNOSIS — N2581 Secondary hyperparathyroidism of renal origin: Secondary | ICD-10-CM | POA: Diagnosis not present

## 2017-10-08 DIAGNOSIS — D631 Anemia in chronic kidney disease: Secondary | ICD-10-CM | POA: Diagnosis not present

## 2017-10-08 DIAGNOSIS — D509 Iron deficiency anemia, unspecified: Secondary | ICD-10-CM | POA: Diagnosis not present

## 2017-10-10 DIAGNOSIS — D509 Iron deficiency anemia, unspecified: Secondary | ICD-10-CM | POA: Diagnosis not present

## 2017-10-10 DIAGNOSIS — D689 Coagulation defect, unspecified: Secondary | ICD-10-CM | POA: Diagnosis not present

## 2017-10-10 DIAGNOSIS — D631 Anemia in chronic kidney disease: Secondary | ICD-10-CM | POA: Diagnosis not present

## 2017-10-10 DIAGNOSIS — N2581 Secondary hyperparathyroidism of renal origin: Secondary | ICD-10-CM | POA: Diagnosis not present

## 2017-10-10 DIAGNOSIS — N186 End stage renal disease: Secondary | ICD-10-CM | POA: Diagnosis not present

## 2017-10-13 DIAGNOSIS — D689 Coagulation defect, unspecified: Secondary | ICD-10-CM | POA: Diagnosis not present

## 2017-10-13 DIAGNOSIS — D509 Iron deficiency anemia, unspecified: Secondary | ICD-10-CM | POA: Diagnosis not present

## 2017-10-13 DIAGNOSIS — D631 Anemia in chronic kidney disease: Secondary | ICD-10-CM | POA: Diagnosis not present

## 2017-10-13 DIAGNOSIS — N2581 Secondary hyperparathyroidism of renal origin: Secondary | ICD-10-CM | POA: Diagnosis not present

## 2017-10-13 DIAGNOSIS — N186 End stage renal disease: Secondary | ICD-10-CM | POA: Diagnosis not present

## 2017-10-15 DIAGNOSIS — D509 Iron deficiency anemia, unspecified: Secondary | ICD-10-CM | POA: Diagnosis not present

## 2017-10-15 DIAGNOSIS — D631 Anemia in chronic kidney disease: Secondary | ICD-10-CM | POA: Diagnosis not present

## 2017-10-15 DIAGNOSIS — N186 End stage renal disease: Secondary | ICD-10-CM | POA: Diagnosis not present

## 2017-10-15 DIAGNOSIS — N2581 Secondary hyperparathyroidism of renal origin: Secondary | ICD-10-CM | POA: Diagnosis not present

## 2017-10-15 DIAGNOSIS — D689 Coagulation defect, unspecified: Secondary | ICD-10-CM | POA: Diagnosis not present

## 2017-10-17 DIAGNOSIS — D509 Iron deficiency anemia, unspecified: Secondary | ICD-10-CM | POA: Diagnosis not present

## 2017-10-17 DIAGNOSIS — D631 Anemia in chronic kidney disease: Secondary | ICD-10-CM | POA: Diagnosis not present

## 2017-10-17 DIAGNOSIS — D689 Coagulation defect, unspecified: Secondary | ICD-10-CM | POA: Diagnosis not present

## 2017-10-17 DIAGNOSIS — N2581 Secondary hyperparathyroidism of renal origin: Secondary | ICD-10-CM | POA: Diagnosis not present

## 2017-10-17 DIAGNOSIS — N186 End stage renal disease: Secondary | ICD-10-CM | POA: Diagnosis not present

## 2017-10-20 DIAGNOSIS — D631 Anemia in chronic kidney disease: Secondary | ICD-10-CM | POA: Diagnosis not present

## 2017-10-20 DIAGNOSIS — D509 Iron deficiency anemia, unspecified: Secondary | ICD-10-CM | POA: Diagnosis not present

## 2017-10-20 DIAGNOSIS — D689 Coagulation defect, unspecified: Secondary | ICD-10-CM | POA: Diagnosis not present

## 2017-10-20 DIAGNOSIS — N186 End stage renal disease: Secondary | ICD-10-CM | POA: Diagnosis not present

## 2017-10-20 DIAGNOSIS — N2581 Secondary hyperparathyroidism of renal origin: Secondary | ICD-10-CM | POA: Diagnosis not present

## 2017-10-22 DIAGNOSIS — N186 End stage renal disease: Secondary | ICD-10-CM | POA: Diagnosis not present

## 2017-10-22 DIAGNOSIS — D689 Coagulation defect, unspecified: Secondary | ICD-10-CM | POA: Diagnosis not present

## 2017-10-22 DIAGNOSIS — N2581 Secondary hyperparathyroidism of renal origin: Secondary | ICD-10-CM | POA: Diagnosis not present

## 2017-10-22 DIAGNOSIS — D509 Iron deficiency anemia, unspecified: Secondary | ICD-10-CM | POA: Diagnosis not present

## 2017-10-22 DIAGNOSIS — D631 Anemia in chronic kidney disease: Secondary | ICD-10-CM | POA: Diagnosis not present

## 2017-10-23 ENCOUNTER — Inpatient Hospital Stay (HOSPITAL_COMMUNITY)
Admission: EM | Admit: 2017-10-23 | Discharge: 2017-10-28 | DRG: 391 | Disposition: A | Discharge status: Home / Self Care | Payer: Medicare Other | Attending: Internal Medicine | Admitting: Internal Medicine

## 2017-10-23 ENCOUNTER — Encounter (HOSPITAL_COMMUNITY): Payer: Self-pay | Admitting: Emergency Medicine

## 2017-10-23 DIAGNOSIS — K219 Gastro-esophageal reflux disease without esophagitis: Secondary | ICD-10-CM | POA: Diagnosis present

## 2017-10-23 DIAGNOSIS — R1032 Left lower quadrant pain: Secondary | ICD-10-CM

## 2017-10-23 DIAGNOSIS — E785 Hyperlipidemia, unspecified: Secondary | ICD-10-CM | POA: Diagnosis present

## 2017-10-23 DIAGNOSIS — R112 Nausea with vomiting, unspecified: Secondary | ICD-10-CM | POA: Diagnosis present

## 2017-10-23 DIAGNOSIS — Z7982 Long term (current) use of aspirin: Secondary | ICD-10-CM

## 2017-10-23 DIAGNOSIS — K5732 Diverticulitis of large intestine without perforation or abscess without bleeding: Principal | ICD-10-CM | POA: Diagnosis present

## 2017-10-23 DIAGNOSIS — R911 Solitary pulmonary nodule: Secondary | ICD-10-CM | POA: Diagnosis present

## 2017-10-23 DIAGNOSIS — N186 End stage renal disease: Secondary | ICD-10-CM | POA: Diagnosis present

## 2017-10-23 DIAGNOSIS — I472 Ventricular tachycardia: Secondary | ICD-10-CM | POA: Diagnosis not present

## 2017-10-23 DIAGNOSIS — R531 Weakness: Secondary | ICD-10-CM | POA: Diagnosis not present

## 2017-10-23 DIAGNOSIS — Z885 Allergy status to narcotic agent status: Secondary | ICD-10-CM

## 2017-10-23 DIAGNOSIS — Z90710 Acquired absence of both cervix and uterus: Secondary | ICD-10-CM

## 2017-10-23 DIAGNOSIS — D631 Anemia in chronic kidney disease: Secondary | ICD-10-CM | POA: Diagnosis present

## 2017-10-23 DIAGNOSIS — N2581 Secondary hyperparathyroidism of renal origin: Secondary | ICD-10-CM | POA: Diagnosis present

## 2017-10-23 DIAGNOSIS — D259 Leiomyoma of uterus, unspecified: Secondary | ICD-10-CM | POA: Diagnosis present

## 2017-10-23 DIAGNOSIS — R404 Transient alteration of awareness: Secondary | ICD-10-CM | POA: Diagnosis not present

## 2017-10-23 DIAGNOSIS — Z8673 Personal history of transient ischemic attack (TIA), and cerebral infarction without residual deficits: Secondary | ICD-10-CM

## 2017-10-23 DIAGNOSIS — E861 Hypovolemia: Secondary | ICD-10-CM | POA: Diagnosis present

## 2017-10-23 DIAGNOSIS — R5383 Other fatigue: Secondary | ICD-10-CM | POA: Diagnosis not present

## 2017-10-23 DIAGNOSIS — I700 Atherosclerosis of aorta: Secondary | ICD-10-CM | POA: Diagnosis present

## 2017-10-23 DIAGNOSIS — E876 Hypokalemia: Secondary | ICD-10-CM | POA: Diagnosis present

## 2017-10-23 DIAGNOSIS — Z992 Dependence on renal dialysis: Secondary | ICD-10-CM

## 2017-10-23 DIAGNOSIS — K5900 Constipation, unspecified: Secondary | ICD-10-CM | POA: Diagnosis present

## 2017-10-23 DIAGNOSIS — Z8249 Family history of ischemic heart disease and other diseases of the circulatory system: Secondary | ICD-10-CM

## 2017-10-23 DIAGNOSIS — I120 Hypertensive chronic kidney disease with stage 5 chronic kidney disease or end stage renal disease: Secondary | ICD-10-CM | POA: Diagnosis present

## 2017-10-23 DIAGNOSIS — Z79899 Other long term (current) drug therapy: Secondary | ICD-10-CM

## 2017-10-23 DIAGNOSIS — R079 Chest pain, unspecified: Secondary | ICD-10-CM | POA: Diagnosis not present

## 2017-10-23 DIAGNOSIS — E8889 Other specified metabolic disorders: Secondary | ICD-10-CM | POA: Diagnosis present

## 2017-10-23 DIAGNOSIS — Z9049 Acquired absence of other specified parts of digestive tract: Secondary | ICD-10-CM

## 2017-10-23 DIAGNOSIS — I080 Rheumatic disorders of both mitral and aortic valves: Secondary | ICD-10-CM | POA: Diagnosis present

## 2017-10-23 LAB — COMPREHENSIVE METABOLIC PANEL
ALT: 8 U/L — ABNORMAL LOW (ref 14–54)
ANION GAP: 16 — AB (ref 5–15)
AST: 13 U/L — AB (ref 15–41)
Albumin: 3.6 g/dL (ref 3.5–5.0)
Alkaline Phosphatase: 49 U/L (ref 38–126)
BILIRUBIN TOTAL: 0.5 mg/dL (ref 0.3–1.2)
BUN: 20 mg/dL (ref 6–20)
CO2: 29 mmol/L (ref 22–32)
Calcium: 10.6 mg/dL — ABNORMAL HIGH (ref 8.9–10.3)
Chloride: 92 mmol/L — ABNORMAL LOW (ref 101–111)
Creatinine, Ser: 6.03 mg/dL — ABNORMAL HIGH (ref 0.44–1.00)
GFR calc Af Amer: 7 mL/min — ABNORMAL LOW (ref >60)
GFR, EST NON AFRICAN AMERICAN: 6 mL/min — AB (ref >60)
Glucose, Bld: 109 mg/dL — ABNORMAL HIGH (ref 65–99)
POTASSIUM: 3 mmol/L — AB (ref 3.5–5.1)
Sodium: 137 mmol/L (ref 135–145)
TOTAL PROTEIN: 7.4 g/dL (ref 6.5–8.1)

## 2017-10-23 LAB — CBC
HCT: 37.1 % (ref 36.0–46.0)
Hemoglobin: 11.9 g/dL — ABNORMAL LOW (ref 12.0–15.0)
MCH: 31.4 pg (ref 26.0–34.0)
MCHC: 32.1 g/dL (ref 30.0–36.0)
MCV: 97.9 fL (ref 78.0–100.0)
Platelets: 209 10*3/uL (ref 150–400)
RBC: 3.79 MIL/uL — ABNORMAL LOW (ref 3.87–5.11)
RDW: 17 % — ABNORMAL HIGH (ref 11.5–15.5)
WBC: 4.6 10*3/uL (ref 4.0–10.5)

## 2017-10-23 LAB — LIPASE, BLOOD: Lipase: 40 U/L (ref 11–51)

## 2017-10-23 MED ORDER — ONDANSETRON 4 MG PO TBDP
4.0000 mg | ORAL_TABLET | Freq: Once | ORAL | Status: AC | PRN
  Administered 2017-10-23: 4 mg via ORAL
  Filled 2017-10-23: qty 1

## 2017-10-23 NOTE — ED Triage Notes (Addendum)
Pt to ED via [facility] with c/o nausea and vomiting.  Pt also c/o pain in chest when she coughs.  Pt goes to dialysis on Monday, Wed, and Fri.  Pt st's abd pain and nausea started yesterday during dialysis treatment.  Pt st's pain in abd radiates through to back

## 2017-10-24 ENCOUNTER — Emergency Department (HOSPITAL_COMMUNITY): Payer: Medicare Other

## 2017-10-24 DIAGNOSIS — D631 Anemia in chronic kidney disease: Secondary | ICD-10-CM | POA: Diagnosis not present

## 2017-10-24 DIAGNOSIS — E785 Hyperlipidemia, unspecified: Secondary | ICD-10-CM | POA: Diagnosis not present

## 2017-10-24 DIAGNOSIS — I080 Rheumatic disorders of both mitral and aortic valves: Secondary | ICD-10-CM | POA: Diagnosis not present

## 2017-10-24 DIAGNOSIS — K5730 Diverticulosis of large intestine without perforation or abscess without bleeding: Secondary | ICD-10-CM | POA: Diagnosis not present

## 2017-10-24 DIAGNOSIS — Z7982 Long term (current) use of aspirin: Secondary | ICD-10-CM | POA: Diagnosis not present

## 2017-10-24 DIAGNOSIS — N186 End stage renal disease: Secondary | ICD-10-CM | POA: Diagnosis not present

## 2017-10-24 DIAGNOSIS — M549 Dorsalgia, unspecified: Secondary | ICD-10-CM | POA: Diagnosis not present

## 2017-10-24 DIAGNOSIS — Z90710 Acquired absence of both cervix and uterus: Secondary | ICD-10-CM | POA: Diagnosis not present

## 2017-10-24 DIAGNOSIS — D259 Leiomyoma of uterus, unspecified: Secondary | ICD-10-CM | POA: Diagnosis present

## 2017-10-24 DIAGNOSIS — I472 Ventricular tachycardia: Secondary | ICD-10-CM | POA: Diagnosis not present

## 2017-10-24 DIAGNOSIS — I700 Atherosclerosis of aorta: Secondary | ICD-10-CM | POA: Diagnosis not present

## 2017-10-24 DIAGNOSIS — K219 Gastro-esophageal reflux disease without esophagitis: Secondary | ICD-10-CM | POA: Diagnosis not present

## 2017-10-24 DIAGNOSIS — Z9049 Acquired absence of other specified parts of digestive tract: Secondary | ICD-10-CM | POA: Diagnosis not present

## 2017-10-24 DIAGNOSIS — R112 Nausea with vomiting, unspecified: Secondary | ICD-10-CM | POA: Diagnosis not present

## 2017-10-24 DIAGNOSIS — E8889 Other specified metabolic disorders: Secondary | ICD-10-CM | POA: Diagnosis not present

## 2017-10-24 DIAGNOSIS — Z8249 Family history of ischemic heart disease and other diseases of the circulatory system: Secondary | ICD-10-CM | POA: Diagnosis not present

## 2017-10-24 DIAGNOSIS — Z8673 Personal history of transient ischemic attack (TIA), and cerebral infarction without residual deficits: Secondary | ICD-10-CM | POA: Diagnosis not present

## 2017-10-24 DIAGNOSIS — E861 Hypovolemia: Secondary | ICD-10-CM | POA: Diagnosis not present

## 2017-10-24 DIAGNOSIS — I120 Hypertensive chronic kidney disease with stage 5 chronic kidney disease or end stage renal disease: Secondary | ICD-10-CM | POA: Diagnosis not present

## 2017-10-24 DIAGNOSIS — K5900 Constipation, unspecified: Secondary | ICD-10-CM | POA: Diagnosis not present

## 2017-10-24 DIAGNOSIS — R079 Chest pain, unspecified: Secondary | ICD-10-CM | POA: Diagnosis not present

## 2017-10-24 DIAGNOSIS — I471 Supraventricular tachycardia: Secondary | ICD-10-CM | POA: Diagnosis not present

## 2017-10-24 DIAGNOSIS — K5790 Diverticulosis of intestine, part unspecified, without perforation or abscess without bleeding: Secondary | ICD-10-CM | POA: Diagnosis not present

## 2017-10-24 DIAGNOSIS — Z992 Dependence on renal dialysis: Secondary | ICD-10-CM | POA: Diagnosis not present

## 2017-10-24 DIAGNOSIS — R911 Solitary pulmonary nodule: Secondary | ICD-10-CM | POA: Diagnosis not present

## 2017-10-24 DIAGNOSIS — R05 Cough: Secondary | ICD-10-CM | POA: Diagnosis not present

## 2017-10-24 DIAGNOSIS — N2581 Secondary hyperparathyroidism of renal origin: Secondary | ICD-10-CM | POA: Diagnosis not present

## 2017-10-24 DIAGNOSIS — K5732 Diverticulitis of large intestine without perforation or abscess without bleeding: Secondary | ICD-10-CM | POA: Diagnosis not present

## 2017-10-24 DIAGNOSIS — R1032 Left lower quadrant pain: Secondary | ICD-10-CM | POA: Diagnosis present

## 2017-10-24 DIAGNOSIS — E876 Hypokalemia: Secondary | ICD-10-CM | POA: Diagnosis not present

## 2017-10-24 DIAGNOSIS — Z79899 Other long term (current) drug therapy: Secondary | ICD-10-CM | POA: Diagnosis not present

## 2017-10-24 LAB — I-STAT CG4 LACTIC ACID, ED
Lactic Acid, Venous: 1.42 mmol/L (ref 0.5–1.9)
Lactic Acid, Venous: 2.58 mmol/L (ref 0.5–1.9)

## 2017-10-24 LAB — RENAL FUNCTION PANEL
Albumin: 3.5 g/dL (ref 3.5–5.0)
Anion gap: 20 — ABNORMAL HIGH (ref 5–15)
BUN: 33 mg/dL — ABNORMAL HIGH (ref 6–20)
CO2: 25 mmol/L (ref 22–32)
Calcium: 9.7 mg/dL (ref 8.9–10.3)
Chloride: 97 mmol/L — ABNORMAL LOW (ref 101–111)
Creatinine, Ser: 7.78 mg/dL — ABNORMAL HIGH (ref 0.44–1.00)
GFR calc Af Amer: 5 mL/min — ABNORMAL LOW (ref >60)
GFR calc non Af Amer: 4 mL/min — ABNORMAL LOW (ref >60)
Glucose, Bld: 136 mg/dL — ABNORMAL HIGH (ref 65–99)
Phosphorus: 7.8 mg/dL — ABNORMAL HIGH (ref 2.5–4.6)
Potassium: 3.5 mmol/L (ref 3.5–5.1)
Sodium: 142 mmol/L (ref 135–145)

## 2017-10-24 MED ORDER — SODIUM CHLORIDE 0.9 % IV SOLN
2.0000 g | INTRAVENOUS | Status: DC
  Administered 2017-10-25 – 2017-10-27 (×3): 2 g via INTRAVENOUS
  Filled 2017-10-24 (×4): qty 20

## 2017-10-24 MED ORDER — ONDANSETRON HCL 4 MG/2ML IJ SOLN
4.0000 mg | Freq: Once | INTRAMUSCULAR | Status: AC
Start: 2017-10-24 — End: 2017-10-24
  Administered 2017-10-24: 4 mg via INTRAVENOUS
  Filled 2017-10-24: qty 2

## 2017-10-24 MED ORDER — IOPAMIDOL (ISOVUE-300) INJECTION 61%: 30.0000 mL | Freq: Once | INTRAVENOUS | Status: DC | PRN

## 2017-10-24 MED ORDER — FENTANYL CITRATE (PF) 100 MCG/2ML IJ SOLN
50.0000 ug | Freq: Once | INTRAMUSCULAR | Status: AC
  Administered 2017-10-24: 50 ug via INTRAVENOUS
  Filled 2017-10-24: qty 2

## 2017-10-24 MED ORDER — HEPARIN SODIUM (PORCINE) 5000 UNIT/ML IJ SOLN
5000.0000 [IU] | Freq: Three times a day (TID) | INTRAMUSCULAR | Status: DC
  Administered 2017-10-24 – 2017-10-28 (×11): 5000 [IU] via SUBCUTANEOUS
  Filled 2017-10-24 (×11): qty 1

## 2017-10-24 MED ORDER — SODIUM CHLORIDE 0.9 % IV BOLUS
500.0000 mL | Freq: Once | INTRAVENOUS | Status: AC
Start: 2017-10-24 — End: 2017-10-24
  Administered 2017-10-24: 500 mL via INTRAVENOUS

## 2017-10-24 MED ORDER — ACETAMINOPHEN 650 MG RE SUPP: 650.0000 mg | Freq: Four times a day (QID) | RECTAL | Status: DC | PRN

## 2017-10-24 MED ORDER — RAMELTEON 8 MG PO TABS
8.0000 mg | ORAL_TABLET | Freq: Every day | ORAL | Status: DC
  Administered 2017-10-24 – 2017-10-27 (×4): 8 mg via ORAL
  Filled 2017-10-24 (×5): qty 1

## 2017-10-24 MED ORDER — FENTANYL CITRATE (PF) 100 MCG/2ML IJ SOLN
25.0000 ug | INTRAMUSCULAR | Status: AC | PRN
Start: 2017-10-24 — End: 2017-10-26
  Administered 2017-10-25 – 2017-10-26 (×3): 25 ug via INTRAVENOUS
  Filled 2017-10-24 (×3): qty 2

## 2017-10-24 MED ORDER — METRONIDAZOLE IN NACL 5-0.79 MG/ML-% IV SOLN
500.0000 mg | Freq: Three times a day (TID) | INTRAVENOUS | Status: AC
  Administered 2017-10-25 (×2): 500 mg via INTRAVENOUS
  Filled 2017-10-24 (×2): qty 100

## 2017-10-24 MED ORDER — PROMETHAZINE HCL 25 MG/ML IJ SOLN
12.5000 mg | Freq: Four times a day (QID) | INTRAMUSCULAR | Status: DC | PRN
  Administered 2017-10-25 – 2017-10-26 (×3): 12.5 mg via INTRAVENOUS
  Filled 2017-10-24 (×3): qty 1

## 2017-10-24 MED ORDER — SODIUM CHLORIDE 0.9 % IV SOLN: 1.0000 g | INTRAVENOUS | Status: DC

## 2017-10-24 MED ORDER — HYDRALAZINE HCL 20 MG/ML IJ SOLN
10.0000 mg | Freq: Three times a day (TID) | INTRAMUSCULAR | Status: DC | PRN
  Administered 2017-10-25: 10 mg via INTRAVENOUS
  Filled 2017-10-24: qty 1

## 2017-10-24 MED ORDER — IOPAMIDOL (ISOVUE-300) INJECTION 61%
INTRAVENOUS | Status: AC
Start: 2017-10-24 — End: 2017-10-24
  Filled 2017-10-24: qty 30

## 2017-10-24 MED ORDER — ONDANSETRON HCL 4 MG/2ML IJ SOLN
4.0000 mg | Freq: Once | INTRAMUSCULAR | Status: AC
  Administered 2017-10-24: 4 mg via INTRAVENOUS
  Filled 2017-10-24: qty 2

## 2017-10-24 MED ORDER — ACETAMINOPHEN 325 MG PO TABS
650.0000 mg | ORAL_TABLET | Freq: Four times a day (QID) | ORAL | Status: DC | PRN
  Administered 2017-10-26 – 2017-10-27 (×3): 650 mg via ORAL
  Filled 2017-10-24 (×3): qty 2

## 2017-10-24 MED ORDER — SENNOSIDES-DOCUSATE SODIUM 8.6-50 MG PO TABS: 1.0000 | ORAL_TABLET | Freq: Every evening | ORAL | Status: DC | PRN

## 2017-10-24 NOTE — ED Notes (Signed)
Pt. Cleaned up and clean sheets put on the bed after pt. Vomited on sheets and into emesis bag. Pt. Vomit was transparent.

## 2017-10-24 NOTE — ED Provider Notes (Signed)
[facility] EMERGENCY DEPARTMENT Provider Note   CSN: [id number] Arrival date & time: 10/23/17  2035     History   Chief Complaint Chief Complaint  Patient presents with  . Emesis    HPI Nancy [name] is a 77 y.o. female.  Patient is a 77 year old female with a history of end-stage renal disease on dialysis Monday Wednesday Friday, aortic stenosis, CHF, hypertension who presents with vomiting and myalgias.  She states she has been feeling bad for 2 days.  She is been achy all over.  She has some low back pain.  She has a cough with some chest pain that only happens when she coughs.  She also reports some abdominal pain.  She says it is in the middle of her abdomen.  She has associated nausea and vomiting.  She states she is vomited multiple times.  She is having normal bowel movements.  She denies any diarrhea or constipation.  She does not produce urine.  She does not report any known fevers.     Past Medical History:  Diagnosis Date  . Anemia   . Aortic stenosis   . Bacterial sinusitis 09/17/2011  . CHF (congestive heart failure) ([place])   . CKD (chronic kidney disease) stage 4, GFR 15-29 ml/min ([place]) 08/11/2006   Cr continues to increase. Proteinuria on UA 02/10/12.    . Colitis   . CVA (cerebrovascular accident) [facility])    New hemorrhagic per CT scan '09  . Diverticulosis of colon   . Dysfunctional uterine bleeding   . Fecal impaction ([place])   . Headache(784.0)   . Heart murmur   . HERNIORRHAPHY, HX OF 08/11/2006  . Hypertension   . OA (osteoarthritis)    bilateral knees  . Postmenopausal   . Pulmonary nodule   . TINEA CRURIS 01/12/2007    Patient Active Problem List   Diagnosis Date Noted  . Left arm pain 07/22/2017  . ESRD (end stage renal disease) ([place]) 04/23/2017  . Secondary hyperparathyroidism of renal origin ([place]) 01/15/2017  . Mitral valve annular calcification   . Malnutrition of moderate degree 12/09/2016  . Constipation 10/15/2016  .  Angina at rest [facility])   . Goals of care, counseling/discussion   . Metabolic acidosis 93/79/0240  . Aortic stenosis 05/21/2016  . Anemia associated with chronic renal failure 02/01/2016  . Atherosclerosis of aorta ([place]) 01/11/2015  . End stage renal disease ([place]) 02/04/2013  . Glaucoma 03/18/2012  . Health care maintenance 09/17/2011  . Osteoarthrosis involving lower leg 10/31/2008  . History of CVA (cerebrovascular accident) 01/28/2008  . Hyperlipidemia 02/13/2007  . PULMONARY NODULES 01/12/2007  . Left ventricular hypertrophy 09/02/2006  . Essential hypertension 08/11/2006  . GERD 08/11/2006    Past Surgical History:  Procedure Laterality Date  . ABDOMINAL HYSTERECTOMY    . AV FISTULA PLACEMENT Left 02/19/2017   Procedure: CREATION OF LEFT ARM BRACHIOCEPHALIC ARTERIOVENOUS (AV) FISTULA;  Surgeon: Rosetta Posner, MD;  Location: [place];  Service: Vascular;  Laterality: Left;  . BASCILIC VEIN TRANSPOSITION Left 04/23/2017   Procedure: BASILIC VEIN TRANSPOSITION SECOND STAGE;  Surgeon: Rosetta Posner, MD;  Location: [place];  Service: Vascular;  Laterality: Left;  . CHOLECYSTECTOMY  2009  . COLONOSCOPY    . INGUINAL HERNIA REPAIR  2008  . INSERTION OF DIALYSIS CATHETER Right 02/19/2017   Procedure: INSERTION OF TUNNELED DIALYSIS CATHETER - RIGHT INTERNAL JUGULAR PLACEMENT;  Surgeon: Rosetta Posner, MD;  Location: [place];  Service: Vascular;  Laterality:  Right;  Marland Kitchen IRIDOTOMY / IRIDECTOMY     Laser, right eye 12/26/11 left eye 01/24/12  . MASS EXCISION Left 05/07/2013   Procedure: EXCISION CYST;  Surgeon: Myrtha Mantis., MD;  Location: [place];  Service: Ophthalmology;  Laterality: Left;     OB History   None      Home Medications    Prior to Admission medications   Medication Sig Start Date End Date Taking? Authorizing Provider  acetaminophen (TYLENOL) 500 MG tablet Take 2 tablets (1,000 mg total) by mouth every 8 (eight) hours as needed for mild pain,  moderate pain, fever or headache. 07/22/17  Yes Colbert Ewing, MD  aspirin EC 81 MG EC tablet Take 1 tablet (81 mg total) by mouth daily. 12/10/16  Yes Hoffman, Jessica Ratliff, DO  calcitRIOL (ROCALTROL) 0.25 MCG capsule Take 1 capsule (0.25 mcg total) by mouth daily. 01/15/17  Yes Sid Falcon, MD  calcium acetate (PHOSLO) 667 MG capsule Take 2 capsules (1,334 mg total) by mouth 3 (three) times daily with meals. Patient taking differently: Take 2,001 mg by mouth 3 (three) times daily with meals.  02/22/17  Yes Lacroce, Hulen Shouts, MD  cloNIDine (CATAPRES - DOSED IN MG/24 HR) 0.2 mg/24hr patch Place 1 patch (0.2 mg total) onto the skin every 7 (seven) days. 07/09/17  Yes Sid Falcon, MD  ferric citrate (AURYXIA) 1 GM 210 MG(Fe) tablet Take 210 mg by mouth daily.   Yes [provider]  nitroGLYCERIN (NITROSTAT) 0.3 MG SL tablet Place 1 tablet (0.3 mg total) under the tongue every 5 (five) minutes as needed for chest pain. 02/27/17  Yes Sid Falcon, MD  SENSIPAR 60 MG tablet Take 1 tablet (60 mg total) by mouth daily. 01/15/17  Yes Sid Falcon, MD  amLODipine (NORVASC) 10 MG tablet Take 1/2 tablet by mouth according to prior schedule Patient not taking: Reported on 10/24/2017 09/20/17   Molt, Romelle Starcher, DO    Family History Family History  Problem Relation Age of Onset  . Hypertension Mother   . Congestive Heart Failure Mother   . Heart attack Brother 53    Social History Social History   Tobacco Use  . Smoking status: Never Smoker  . Smokeless tobacco: Never Used  Substance Use Topics  . Alcohol use: No    Alcohol/week: 0.0 oz  . Drug use: No    Comment: 08/15/08 UDS + cocaine     Allergies   Hydrocodone-acetaminophen   Review of Systems Review of Systems  Constitutional: Positive for fatigue. Negative for chills, diaphoresis and fever.  HENT: Negative for congestion, rhinorrhea and sneezing.   Eyes: Negative.   Respiratory: Positive for cough. Negative for  chest tightness and shortness of breath.   Cardiovascular: Positive for chest pain. Negative for leg swelling.  Gastrointestinal: Positive for abdominal pain, nausea and vomiting. Negative for blood in stool and diarrhea.  Genitourinary: Negative for difficulty urinating, flank pain, frequency and hematuria.  Musculoskeletal: Positive for back pain and myalgias. Negative for arthralgias.  Skin: Negative for rash.  Neurological: Negative for dizziness, speech difficulty, weakness, numbness and headaches.     Physical Exam Updated Vital Signs BP (!) 153/70   Pulse (!) 127   Temp 98.7 F (37.1 C) (Rectal)   Resp (!) 23   Ht 5\' 3"  (1.6 m)   Wt 59.9 kg (132 lb)   SpO2 99%   BMI 23.38 kg/m   Physical Exam  Constitutional: She is oriented to  person, place, and time. She appears well-developed and well-nourished.  HENT:  Head: Normocephalic and atraumatic.  Eyes: Pupils are equal, round, and reactive to light.  Neck: Normal range of motion. Neck supple.  Cardiovascular: Regular rhythm. Tachycardia present.  Murmur (Harsh holosystolic murmur) heard. Pulmonary/Chest: Effort normal and breath sounds normal. No respiratory distress. She has no wheezes. She has no rales. She exhibits no tenderness.  Abdominal: Soft. Bowel sounds are normal. There is tenderness (Moderate diffuse tenderness). There is no rebound and no guarding.  Musculoskeletal: Normal range of motion. She exhibits no edema.  Lymphadenopathy:    She has no cervical adenopathy.  Neurological: She is alert and oriented to person, place, and time.  Skin: Skin is warm and dry. No rash noted.  Psychiatric: She has a normal mood and affect.     ED Treatments / Results  Labs (all labs ordered are listed, but only abnormal results are displayed) Labs Reviewed  COMPREHENSIVE METABOLIC PANEL - Abnormal; Notable for the following components:      Result Value   Potassium 3.0 (*)    Chloride 92 (*)    Glucose, Bld 109 (*)     Creatinine, Ser 6.03 (*)    Calcium 10.6 (*)    AST 13 (*)    ALT 8 (*)    GFR calc non Af Amer 6 (*)    GFR calc Af Amer 7 (*)    Anion gap 16 (*)    All other components within normal limits  CBC - Abnormal; Notable for the following components:   RBC 3.79 (*)    Hemoglobin 11.9 (*)    RDW 17.0 (*)    All other components within normal limits  I-STAT CG4 LACTIC ACID, ED - Abnormal; Notable for the following components:   Lactic Acid, Venous 2.58 (*)    All other components within normal limits  CULTURE, BLOOD (ROUTINE X 2)  CULTURE, BLOOD (ROUTINE X 2)  LIPASE, BLOOD  I-STAT CG4 LACTIC ACID, ED  I-STAT CG4 LACTIC ACID, ED    EKG EKG Interpretation  Date/Time:  Friday October 24 2017 08:13:18 EDT Ventricular Rate:  111 PR Interval:    QRS Duration: 113 QT Interval:  369 QTC Calculation: 502 R Axis:   34 Text Interpretation:  Junctional tachycardia Anteroseptal infarct, old Repol abnrm suggests ischemia, diffuse leads Prolonged QT interval ST segments similar to prior EKGs Confirmed by Malvin Johns [id number]) on 10/24/2017 8:20:29 AM   Radiology Ct Abdomen Pelvis Wo Contrast  Result Date: 10/24/2017 CLINICAL DATA:  Pt c/o LLQ, LEFT groin pain, with nausea,constipation x 3-4 days; Hx of umbilical hernia repair EXAM: CT ABDOMEN AND PELVIS WITHOUT CONTRAST TECHNIQUE: Multidetector CT imaging of the abdomen and pelvis was performed following the standard protocol without IV contrast. COMPARISON:  06/21/2016 FINDINGS: Lower chest: There is extensive atherosclerotic calcification of the aortic valve and mitral annulus. Heart size is UPPER normal. No pericardial effusion. Lung bases are unremarkable. Hepatobiliary: Liver is homogeneous accounting for noncontrast exam. Status post cholecystectomy. Pancreas: Unremarkable. No pancreatic ductal dilatation or surrounding inflammatory changes. Spleen: Normal in size without focal abnormality. Adrenals/Urinary Tract: Adrenal glands are normal.  The kidneys are small, 6.5 centimeters on the RIGHT and 6.9 centimeters on the LEFT. No hydronephrosis or suspicious renal mass. No intrarenal or ureteral stones. Stomach/Bowel: Stomach and small bowel loops are normal in appearance. There are numerous colonic diverticula, particularly involving the sigmoid colon. No evidence for acute diverticulitis. The appendix is well seen and has  a normal appearance. Vascular/Lymphatic: There is atherosclerotic calcification of the abdominal aorta not associated with aneurysm. No retroperitoneal or mesenteric adenopathy. Reproductive: The uterus is enlarged and contains numerous calcified fibroids. No adnexal mass. Other: No free pelvic fluid. Anterior abdominal wall is unremarkable. No abdominal wall hernia. Musculoskeletal: Degenerative changes and numerous Schmorl's nodes are present. Vacuum disc phenomenon and endplate changes are identified at L2-3. IMPRESSION: 1. No bowel obstruction or abscess. 2. Colonic diverticulosis without acute diverticulitis. 3. Mitral and aortic valve calcifications. 4. Cholecystectomy. 5. Diminutive kidneys without focal mass or hydronephrosis. 6. Aortic atherosclerosis.  (ICD10-I70.0) 7. Enlarged uterus with multiple calcified fibroids. 8. Degenerative disc disease. Electronically Signed   By: Nolon Nations M.D.   On: 10/24/2017 11:56   Dg Chest 2 View  Result Date: 10/24/2017 CLINICAL DATA:  Chest pain and cough.  Renal failure EXAM: CHEST - 2 VIEW COMPARISON:  September 19, 2017 FINDINGS: There is a small left pleural effusion. There is no edema or consolidation. Heart is mildly enlarged with pulmonary vascularity within normal limits. There is aortic atherosclerosis. No bone lesions. No adenopathy. IMPRESSION: Small left pleural effusion. No edema or consolidation. Heart mildly enlarged. Aortic atherosclerosis present. Aortic Atherosclerosis (ICD10-I70.0). Electronically Signed   By: Lowella Grip III M.D.   On: 10/24/2017 07:58     Procedures Procedures (including critical care time)  Medications Ordered in ED Medications  iopamidol (ISOVUE-300) 61 % injection 30 mL (has no administration in time range)  iopamidol (ISOVUE-300) 61 % injection (has no administration in time range)  ondansetron (ZOFRAN-ODT) disintegrating tablet 4 mg (4 mg Oral Given 10/23/17 2054)  sodium chloride 0.9 % bolus 500 mL (0 mLs Intravenous Stopped 10/24/17 0924)  ondansetron (ZOFRAN) injection 4 mg (4 mg Intravenous Given 10/24/17 0825)  fentaNYL (SUBLIMAZE) injection 50 mcg (50 mcg Intravenous Given 10/24/17 1255)  ondansetron (ZOFRAN) injection 4 mg (4 mg Intravenous Given 10/24/17 1254)     Initial Impression / Assessment and Plan / ED Course  I have reviewed the triage vital signs and the nursing notes.  Pertinent labs & imaging results that were available during my care of the patient were reviewed by me and considered in my medical decision making (see chart for details).     Patient is a 77 year old female who presents with abdominal pain associated with vomiting and myalgias and cough.  Her chest x-ray is negative.  There is no evidence of pneumonia.  CT scan does not show any evidence of acute infectious etiology.  There are some uterine fibroids which may be contributing to her pain.  Her labs are non-concerning.  She did miss dialysis today but her potassium is on the low side.  She also does not have signs of fluid overload.  She has ongoing abdominal pain and dry heaves despite treatment in the ED.  She remains tachycardic with a heart rate in the 120s.  I will consult the internal medicine teaching service for admission.  I spoke with the resident who will admit the pt.  Final Clinical Impressions(s) / ED Diagnoses   Final diagnoses:  Left lower quadrant pain  Intractable vomiting with nausea, unspecified vomiting type    ED Discharge Orders    None       Malvin Johns, MD 10/24/17 1533

## 2017-10-24 NOTE — ED Notes (Signed)
Pt states she feels sweaty but that she is cold. No sweating noted. Pt provided with a cool rag. Vital signs remain the same.

## 2017-10-24 NOTE — ED Notes (Signed)
Ginger ale given to pt.  

## 2017-10-24 NOTE — ED Notes (Signed)
CT called to notify that pt is not tolerating oral contrast and is ready for her scan.

## 2017-10-24 NOTE — Progress Notes (Addendum)
Pharmacy Antibiotic Note  Nancy [name] is a 77 y.o. female admitted on 10/23/2017 with concern for diverticulitis.  Pharmacy has been consulted for ceftriaxone dosing. Patient has ESRD and has HD on MWF and MD note indicates she missed Friday's session. CT shows diverticulosis with no evidence of diverticulitis, however patient did not receive contrast for the scan.   Plan: Ceftriaxone 2 gm every 24 hours. F/U cultures, clinical progress, and HD schedule.  Height: 5\' 3"  (160 cm) Weight: 132 lb (59.9 kg) IBW/kg (Calculated) : 52.4  Temp (24hrs), Avg:98.4 F (36.9 C), Min:98.2 F (36.8 C), Max:98.7 F (37.1 C)  Recent Labs  Lab 10/23/17 2054 10/24/17 0001 10/24/17 0818  WBC 4.6  --   --   CREATININE 6.03*  --   --   LATICACIDVEN  --  2.58* 1.42    Estimated Creatinine Clearance: 6.7 mL/min (A) (by C-G formula based on SCr of 6.03 mg/dL (H)).    Allergies  Allergen Reactions  . Hydrocodone-Acetaminophen Nausea And Vomiting and Other (See Comments)    Dizziness (also)    Antimicrobials this admission: Ceftriaxone 4/19 >> Metronidazole 4/19 >>  Dose adjustments this admission: none  Microbiology results: 4/19 BCx: pending  Thank you for allowing pharmacy to be a part of this patient's care.  Orma Render 10/24/2017 6:17 PM

## 2017-10-24 NOTE — ED Notes (Signed)
Pt wheeled to bathroom

## 2017-10-24 NOTE — H&P (Signed)
Date: 10/24/2017               Patient Name:  Nancy [name] MRN: [id number]  DOB: 04/26/1941 Age / Sex: 77 y.o., female   PCP: Aldine Contes, MD         Medical Service: Internal Medicine Teaching Service         Attending Physician: Dr. Rebeca Alert Raynaldo Opitz, MD    First Contact: Dr. Vickki Muff Pager: [phone]  Second Contact: Dr. Kalman Shan Pager: [phone]       After Hours (After 5p/  First Contact Pager: [phone]  weekends / holidays): Second Contact Pager: [phone]    Chief Complaint: abd pain , nausea, vomiting   History of Present Illness: 77 year old female with past medical history significant for ESRD on Monday Wednesday Friday dialysis, aortic stenosis, mitral stenosis.  She arrives to the emergency department after increased nausea and vomiting associated with abdominal pain in her left lower quadrant.  She states she noticed this beginning after her Wednesday dialysis session.  She was fine during dialysis noted no drop in blood pressure.  When she arrived home she started cleaning up around the house and started having some dizziness and sweating.  She went and laid down and then when she got back up she began to vomit.  He has had poor appetite since then with nausea she also reports feeling constipated with her last bowel movement on Wednesday afternoon.  This bowel movement was normal not loose brown with no stool it was not painful.  She does report left lower quadrant pain since Wednesday.  Despite having frequent constipation she states that she does not take stool softeners at home.  She denies having any diarrhea, has not noticed any fevers.  She has felt more short of breath while this is going on and has noticed some left-sided pleuritic chest pain that is worse when throwing up.  She still makes some urine but denies any dysuria.  She reports missing her dialysis session today due to feeling so poorly.  She tried eating some yesterday but vomited  immediately afterwards.  She reports no food intake since before Wednesday.     ED course: pt arrived tachycardic otherwise normal vitals, abdominal pain, normal white count, ACD Hgb 11.9, mild hypokalemia K 3.0   Had an Elevated lactate given fentanyl 39mcg, given 500cc bolus NS, zofran. Lactate resolved. She had a CT abd pelv without contrast showing multiple findings largely unrelated to presentation most notable for a comment of diverticulosis without evidence of diverticulitis.  Her chest x-ray and ecg interpretations are below.    Meds:  Current Meds  Medication Sig  . acetaminophen (TYLENOL) 500 MG tablet Take 2 tablets (1,000 mg total) by mouth every 8 (eight) hours as needed for mild pain, moderate pain, fever or headache.  Marland Kitchen aspirin EC 81 MG EC tablet Take 1 tablet (81 mg total) by mouth daily.  . calcitRIOL (ROCALTROL) 0.25 MCG capsule Take 1 capsule (0.25 mcg total) by mouth daily.  . calcium acetate (PHOSLO) 667 MG capsule Take 2 capsules (1,334 mg total) by mouth 3 (three) times daily with meals. (Patient taking differently: Take 2,001 mg by mouth 3 (three) times daily with meals. )  . cloNIDine (CATAPRES - DOSED IN MG/24 HR) 0.2 mg/24hr patch Place 1 patch (0.2 mg total) onto the skin every 7 (seven) days.  . ferric citrate (AURYXIA) 1 GM 210 MG(Fe) tablet Take 210 mg by mouth daily.  Marland Kitchen  nitroGLYCERIN (NITROSTAT) 0.3 MG SL tablet Place 1 tablet (0.3 mg total) under the tongue every 5 (five) minutes as needed for chest pain.  . SENSIPAR 60 MG tablet Take 1 tablet (60 mg total) by mouth daily.     Allergies: Allergies as of 10/23/2017 - Review Complete 09/19/2017  Allergen Reaction Noted  . Hydrocodone-acetaminophen Nausea And Vomiting and Other (See Comments)    Past Medical History:  Diagnosis Date  . Anemia   . Aortic stenosis   . Bacterial sinusitis 09/17/2011  . CHF (congestive heart failure) ([place])   . CKD (chronic kidney disease) stage 4, GFR 15-29 ml/min ([place]) 08/11/2006    Cr continues to increase. Proteinuria on UA 02/10/12.    . Colitis   . CVA ([facility]rebrovascular accident) [facility])    New hemorrhagic per CT scan '09  . Diverticulosis of colon   . Dysfunctional uterine bleeding   . Fecal impaction ([place])   . Headache(784.0)   . Heart murmur   . HERNIORRHAPHY, HX OF 08/11/2006  . Hypertension   . OA (osteoarthritis)    bilateral knees  . Postmenopausal   . Pulmonary nodule   . TINEA CRURIS 01/12/2007    Family History:  Family History  Problem Relation Age of Onset  . Hypertension Mother   . Congestive Heart Failure Mother   . Heart attack Brother 25    Social History:  Social History   Socioeconomic History  . Marital status: Married    Spouse name: Not on file  . Number of children: Not on file  . Years of education: Not on file  . Highest education level: Not on file  Occupational History  . Not on file  Social Needs  . Financial resource strain: Not on file  . Food insecurity:    Worry: Not on file    Inability: Not on file  . Transportation needs:    Medical: Not on file    Non-medical: Not on file  Tobacco Use  . Smoking status: Never Smoker  . Smokeless tobacco: Never Used  Substance and Sexual Activity  . Alcohol use: No    Alcohol/week: 0.0 oz  . Drug use: No    Comment: 08/15/08 UDS + cocaine  . Sexual activity: Not Currently  Lifestyle  . Physical activity:    Days per week: Not on file    Minutes per session: Not on file  . Stress: Not on file  Relationships  . Social connections:    Talks on phone: Not on file    Gets together: Not on file    Attends religious service: Not on file    Active member of club or organization: Not on file    Attends meetings of clubs or organizations: Not on file    Relationship status: Not on file  . Intimate partner violence:    Fear of current or ex partner: Not on file    Emotionally abused: Not on file    Physically abused: Not on file    Forced sexual activity: Not on file    Other Topics Concern  . Not on file  Social History Narrative   Married, lives with her husband. 1 child.           Review of Systems: A complete ROS was negative except as per HPI.   Physical Exam: Blood pressure (!) 173/79, pulse (!) 115, temperature 98.7 F (37.1 C), temperature source Rectal, resp. rate (!) 21, height 5\' 3"  (1.6 m), weight 132  lb (59.9 kg), SpO2 95 %. Physical Exam  Constitutional: She is oriented to person, place, and time. No distress.  HENT:  Head: Normocephalic and atraumatic.  Eyes: Right eye exhibits no discharge. Left eye exhibits no discharge. No scleral icterus.  Cardiovascular: Normal rate and regular rhythm. Exam reveals no gallop and no friction rub.  Murmur (3/6 systolic murmur) heard. Pulmonary/Chest: Effort normal and breath sounds normal. No respiratory distress. She has no wheezes. She has no rales. She exhibits no tenderness.  Abdominal: Soft. Bowel sounds are normal. She exhibits no distension and no mass. There is tenderness (Inferior LLQ). There is no rebound and no guarding.  Musculoskeletal: She exhibits edema (RLE). She exhibits no tenderness.  Neurological: She is alert and oriented to person, place, and time.  Skin: Skin is warm and dry. She is not diaphoretic.  Psychiatric: She has a normal mood and affect.    EKG: personally reviewed my interpretation is sinus tachycardia, QTC 435, NSIVCD  CXR: personally reviewed my interpretation is aortic atherosclerosis, no acute cardiopulmonary abnormality,   Assessment & Plan by Problem: Active Problems:   Vomiting  LLQ pain, Nausea/Vomiting: Pt arrives with a physical exam and history concerning for diverticulitis.  CT shows extensive diverticulosis no evidence of diverticulitis however pt did not receive IV or oral contrast.    -will treat as diverticulitis start ceftriaxone and metronidazole -gentle IV fluids as pt ESRD -phenergan 12.5mg  Q6 PRN -fentanyl 80mcg PRN Q2  ESRD: on MWF  dialysis missed Friday session  -will consult nephrology in am no acute need for dialysis  Hypokalemia: mild pt likely just shifted  -continue to monitor, if still low after resolution of vomiting will replete  HTN: Pt on clonidine patch 0.2mg  q 7 days, amlodipine 10mg    -not due for patch just replaced on Tuesday -will use hydralazine 10mg  IV Q8    Dispo: Admit patient to Inpatient with expected length of stay greater than 2 midnights.  Signed: Katherine Roan, MD 10/24/2017, 5:38 PM  Vickki Muff MD PGY-1 Internal Medicine Pager # [phone]

## 2017-10-25 DIAGNOSIS — I120 Hypertensive chronic kidney disease with stage 5 chronic kidney disease or end stage renal disease: Secondary | ICD-10-CM

## 2017-10-25 DIAGNOSIS — N186 End stage renal disease: Secondary | ICD-10-CM

## 2017-10-25 DIAGNOSIS — R011 Cardiac murmur, unspecified: Secondary | ICD-10-CM

## 2017-10-25 DIAGNOSIS — K5790 Diverticulosis of intestine, part unspecified, without perforation or abscess without bleeding: Secondary | ICD-10-CM

## 2017-10-25 DIAGNOSIS — Z992 Dependence on renal dialysis: Secondary | ICD-10-CM

## 2017-10-25 DIAGNOSIS — M549 Dorsalgia, unspecified: Secondary | ICD-10-CM

## 2017-10-25 DIAGNOSIS — Z79899 Other long term (current) drug therapy: Secondary | ICD-10-CM

## 2017-10-25 DIAGNOSIS — E876 Hypokalemia: Secondary | ICD-10-CM

## 2017-10-25 LAB — LACTIC ACID, PLASMA: Lactic Acid, Venous: 2.2 mmol/L (ref 0.5–1.9)

## 2017-10-25 MED ORDER — HYDRALAZINE HCL 20 MG/ML IJ SOLN
10.0000 mg | Freq: Three times a day (TID) | INTRAMUSCULAR | Status: DC
  Administered 2017-10-25 – 2017-10-28 (×9): 10 mg via INTRAVENOUS
  Filled 2017-10-25 (×9): qty 1

## 2017-10-25 MED ORDER — SODIUM CHLORIDE 0.9 % IV BOLUS
500.0000 mL | Freq: Once | INTRAVENOUS | Status: AC
  Administered 2017-10-25: 500 mL via INTRAVENOUS

## 2017-10-25 NOTE — Discharge Summary (Signed)
Name: Nancy [name] MRN: [id number] DOB: [birth year] 77 y.o. PCP: Aldine Contes, MD  Date of Admission: 10/23/2017  8:46 PM Date of Discharge: 10/28/17 Attending Physician: Aldine Contes, MD  Discharge Diagnosis:  Principal Problem:   Intractable nausea and vomiting Active Problems:   Diverticulitis of colon   End stage renal disease [facility])   Discharge Medications: Allergies as of 10/28/2017      Reactions   Hydrocodone-acetaminophen Nausea And Vomiting, Other (See Comments)   Dizziness (also)      Medication List    TAKE these medications   acetaminophen 500 MG tablet Commonly known as:  TYLENOL Take 2 tablets (1,000 mg total) by mouth every 8 (eight) hours as needed for mild pain, moderate pain, fever or headache.   amLODipine 10 MG tablet Commonly known as:  NORVASC Take 1/2 tablet by mouth according to prior schedule   amoxicillin-clavulanate 400-57 MG chewable tablet Commonly known as:  AUGMENTIN Chew 1 tablet by mouth daily for 2 days.   aspirin 81 MG EC tablet Take 1 tablet (81 mg total) by mouth daily.   AURYXIA 1 GM 210 MG(Fe) tablet Generic drug:  ferric citrate Take 210 mg by mouth daily.   calcitRIOL 0.25 MCG capsule Commonly known as:  ROCALTROL Take 1 capsule (0.25 mcg total) by mouth daily.   calcium acetate 667 MG capsule Commonly known as:  PHOSLO Take 2 capsules (1,334 mg total) by mouth 3 (three) times daily with meals. What changed:  how much to take   cloNIDine 0.2 mg/24hr patch Commonly known as:  CATAPRES - Dosed in mg/24 hr Place 1 patch (0.2 mg total) onto the skin every 7 (seven) days.   nitroGLYCERIN 0.3 MG SL tablet Commonly known as:  NITROSTAT Place 1 tablet (0.3 mg total) under the tongue every 5 (five) minutes as needed for chest pain.   polyethylene glycol packet Commonly known as:  MIRALAX / GLYCOLAX Take 17 g by mouth daily.   promethazine 12.5 MG tablet Commonly known as:  PHENERGAN Take 1 tablet (12.5 mg  total) by mouth every 6 (six) hours as needed for up to 2 days for nausea or vomiting.   senna-docusate 8.6-50 MG tablet Commonly known as:  Senokot-S Take 1 tablet by mouth at bedtime as needed for mild constipation or moderate constipation.   SENSIPAR 60 MG tablet Generic drug:  cinacalcet Take 1 tablet (60 mg total) by mouth daily.       Disposition and follow-up:   Nancy [name] was discharged from [facility] in stable condition.  At the hospital follow up visit please address:  Diverticulitis  -finish course of augmentin -ensure pt having daily bowel movements titrate bowel regimen   2.  Labs / imaging needed at time of follow-up: cbc, bmp  3.  Pending labs/ test needing follow-up: none  Follow-up Appointments: Follow-up Information    Aldine Contes, MD Follow up in 3 day(s).   Specialty:  Internal Medicine Why:  Pt has appointment scheduled for Thursday the 25th at 2:15pm Contact information: [address], [place] [address] [phone]           Hospital Course by problem list: Principal Problem:   Intractable nausea and vomiting Active Problems:   Diverticulitis of colon   End stage renal disease ([place])   Patient arrived with left lower quadrant pain, nausea and vomiting feeling poorly.  She tells [place] this began after her Wednesday dialysis session.  She had  been constipated for some time.  She felt dizzy, nauseous and vomited.  She continued to feel poorly and have pain so came in for evaluation.  Her workup came back negative, her CT abd/pelvis showed diverticulosis without radiologic evidence of diverticulitis, for some reason this study was done without contrast.  As the patient's symptoms and history were classic for diverticulitis we treated it as such.  She was started on zosyn initially and transitioned to ceftriaxone and flagyl.  Her nausea and vomiting improved and she was able to tolerate PO intake before  discharge.  She was anxious to leave.  She was discharged with a prescription for augmentin to finish her abx course.  After the patient left the nurse called me to tell me that Nancy [name] told her that she was planning on skipping dialysis for another week as she was leaving.  I called the patient and spoke with her about her pharmacy changing location and the importance of going to dialysis.  Unfortunately she was not willing to listen and was adamant about not going but said she would pick back up in a week.    Discharge Vitals:   BP (!) 162/75   Pulse (!) 107   Temp 97.7 F (36.5 C)   Resp 18   Ht 5\' 3"  (1.6 m)   Wt 136 lb 14.5 oz (62.1 kg)   SpO2 96%   BMI 24.25 kg/m   Pertinent Labs, Studies, and Procedures:  CLINICAL DATA:  Pt c/o LLQ, LEFT groin pain, with nausea,constipation x 3-4 days; Hx of umbilical hernia repair   EXAM: CT ABDOMEN AND PELVIS WITHOUT CONTRAST   TECHNIQUE: Multidetector CT imaging of the abdomen and pelvis was performed following the standard protocol without IV contrast.   COMPARISON:  06/21/2016   FINDINGS: Lower chest: There is extensive atherosclerotic calcification of the aortic valve and mitral annulus. Heart size is UPPER normal. No pericardial effusion. Lung bases are unremarkable.   Hepatobiliary: Liver is homogeneous accounting for noncontrast exam. Status post cholecystectomy.   Pancreas: Unremarkable. No pancreatic ductal dilatation or surrounding inflammatory changes.   Spleen: Normal in size without focal abnormality.   Adrenals/Urinary Tract: Adrenal glands are normal. The kidneys are small, 6.5 centimeters on the RIGHT and 6.9 centimeters on the LEFT. No hydronephrosis or suspicious renal mass. No intrarenal or ureteral stones.   Stomach/Bowel: Stomach and small bowel loops are normal in appearance. There are numerous colonic diverticula, particularly involving the sigmoid colon. No evidence for acute diverticulitis. The  appendix is well seen and has a normal appearance.   Vascular/Lymphatic: There is atherosclerotic calcification of the abdominal aorta not associated with aneurysm. No retroperitoneal or mesenteric adenopathy.   Reproductive: The uterus is enlarged and contains numerous calcified fibroids. No adnexal mass.   Other: No free pelvic fluid. Anterior abdominal wall is unremarkable. No abdominal wall hernia.   Musculoskeletal: Degenerative changes and numerous Schmorl's nodes are present. Vacuum disc phenomenon and endplate changes are identified at L2-3.   IMPRESSION: 1. No bowel obstruction or abscess. 2. Colonic diverticulosis without acute diverticulitis. 3. Mitral and aortic valve calcifications. 4. Cholecystectomy. 5. Diminutive kidneys without focal mass or hydronephrosis. 6. Aortic atherosclerosis.  (ICD10-I70.0) 7. Enlarged uterus with multiple calcified fibroids. 8. Degenerative disc disease.     Electronically Signed   By: Nolon Nations M.D.   On: 10/24/2017 11:56  CLINICAL DATA:  Chest pain and cough.  Renal failure   EXAM: CHEST - 2 VIEW   COMPARISON:  September 19, 2017   FINDINGS: There is a small left pleural effusion. There is no edema or consolidation. Heart is mildly enlarged with pulmonary vascularity within normal limits. There is aortic atherosclerosis. No bone lesions. No adenopathy.   IMPRESSION: Small left pleural effusion. No edema or consolidation. Heart mildly enlarged. Aortic atherosclerosis present.   Aortic Atherosclerosis (ICD10-I70.0).     Electronically Signed   By: Lowella Grip III M.D.   On: 10/24/2017 07:58    Discharge Instructions: Discharge Instructions    Diet - low sodium heart healthy   Complete by:  As directed    Discharge instructions   Complete by:  As directed    Nancy [name], you had some inflammation in your colon, we have given you some antibiotics to help calm things down please finish this course.  You will  need to have a bowel movement every day from now on and avoid constipation.  You have some medicines that can help you with this, we will send them to your pharmacy. I have written for some additional nausea medicine that should help you over the next few days. You can adjust how much laxative you take to make sure you're having one to two bowel movements per day.  You should take the miralax daily and use the senokot on an as needed basis.  Please introduce more fiber into your diet, consider adding metamucil a fiber supplement to your diet.  If you do make sure you take it at least two hours after your medications.  We have a follow up appointment scheduled for you in our clinic on 10/30/17 at 2:15pm.  You will need to go to your outpatient dialysis center tomorrow for your regularly scheduled dialysis.   Increase activity slowly   Complete by:  As directed       Signed: Katherine Roan, MD 10/28/2017, 11:48 AM

## 2017-10-25 NOTE — Progress Notes (Signed)
Tele called and reported 18 beat run of SVT.  Checked pt and paged Fr. Raines.

## 2017-10-25 NOTE — Progress Notes (Signed)
CRITICAL VALUE ALERT  Critical Value:  Lactic acid 2.2  Date & Time Notied:  10/25/17 @ 20:00  Provider Notified: on call  Orders Received/Actions taken: no new orders received

## 2017-10-25 NOTE — Progress Notes (Signed)
  Date: 10/25/2017  Patient name: Nancy [name]  Medical record number: [id number]  Date of birth: 1941-02-20   I have seen and evaluated this patient and I have discussed the plan of care with the house staff. Please see their note for complete details. I concur with their findings with the following additions/corrections:   Admitted for vomiting and LLQ pain, no clear etiology on labs or imaging, but extensive diverticulosis seen. Given location of pain and CT was obtained without IV contrast, highest concern is for diverticulitis that was not able to visualized on CT. Will treat with ceftriaxone and metronidazole.  Fortunately, feeling better today and abdominal exam is reassuring with left-sided tenderness but no peritoneal signs and active bowel sounds. Also presented with evidence of hypovolemia, receiving gentle hydration as has ESRD on HD. Hypokalemia noted on admission, but likely shifted intracellularly, improved without intervention, and would be hesitant to give additional K in a dialysis dependent patient.   [name], M.D., Ph.D. 10/25/2017, 3:47 PM

## 2017-10-25 NOTE — Consult Note (Addendum)
[facility] Renal Consultation Note    Indication for Consultation:  Management of ESRD/hemodialysis; anemia, hypertension/volume and secondary hyperparathyroidism  HPI: Nancy [name] is a 77 y.o. female with ESRD on HD (MWF at [facility]), HTN, CVA, AS, HFpEF 65-70% on Echo 07/2017  She presented to ED with 3 day history of abdominal pain, nausea and vomiting. Thursday evening vomit "coming out of my nose and I got scared" and called EMS. Abd CT showed colonic diverticulosis without acute diverticulitis. Antibiotics started. CXR shows small L pleural effusion. Labs stable. No respiratory distress. Hypertensive with HR 100s  Seen in room with family present. She endorses continued nausea and vomiting, even with sips of water. Had some chest pain earlier, but has resolved. Denies fevers, chills, abd pain, hematemesis, diarrhea.   She missed her scheduled dialysis yesterday while in the ED. She is compliant with treatments and usually reaches her EDW.   Past Medical History:  Diagnosis Date  . Anemia   . Aortic stenosis   . Bacterial sinusitis 09/17/2011  . CHF (congestive heart failure) ([place])   . CKD (chronic kidney disease) stage 4, GFR 15-29 ml/min ([place]) 08/11/2006   Cr continues to increase. Proteinuria on UA 02/10/12.    . Colitis   . CVA (cerebrovascular accident) [facility])    New hemorrhagic per CT scan '09  . Diverticulosis of colon   . Dysfunctional uterine bleeding   . Fecal impaction ([place])   . Headache(784.0)   . Heart murmur   . HERNIORRHAPHY, HX OF 08/11/2006  . Hypertension   . OA (osteoarthritis)    bilateral knees  . Postmenopausal   . Pulmonary nodule   . TINEA CRURIS 01/12/2007   Past Surgical History:  Procedure Laterality Date  . ABDOMINAL HYSTERECTOMY    . AV FISTULA PLACEMENT Left 02/19/2017   Procedure: CREATION OF LEFT ARM BRACHIOCEPHALIC ARTERIOVENOUS (AV) FISTULA;  Surgeon: Rosetta Posner, MD;  Location: [place];  Service: Vascular;   Laterality: Left;  . BASCILIC VEIN TRANSPOSITION Left 04/23/2017   Procedure: BASILIC VEIN TRANSPOSITION SECOND STAGE;  Surgeon: Rosetta Posner, MD;  Location: [place];  Service: Vascular;  Laterality: Left;  . CHOLECYSTECTOMY  2009  . COLONOSCOPY    . INGUINAL HERNIA REPAIR  2008  . INSERTION OF DIALYSIS CATHETER Right 02/19/2017   Procedure: INSERTION OF TUNNELED DIALYSIS CATHETER - RIGHT INTERNAL JUGULAR PLACEMENT;  Surgeon: Rosetta Posner, MD;  Location: [place];  Service: Vascular;  Laterality: Right;  . IRIDOTOMY / IRIDECTOMY     Laser, right eye 12/26/11 left eye 01/24/12  . MASS EXCISION Left 05/07/2013   Procedure: EXCISION CYST;  Surgeon: Myrtha Mantis., MD;  Location: [place];  Service: Ophthalmology;  Laterality: Left;   Family History  Problem Relation Age of Onset  . Hypertension Mother   . Congestive Heart Failure Mother   . Heart attack Brother 70   Social History:  reports that she has never smoked. She has never used smokeless tobacco. She reports that she does not drink alcohol or use drugs. Allergies  Allergen Reactions  . Hydrocodone-Acetaminophen Nausea And Vomiting and Other (See Comments)    Dizziness (also)   Prior to Admission medications   Medication Sig Start Date End Date Taking? Authorizing Provider  acetaminophen (TYLENOL) 500 MG tablet Take 2 tablets (1,000 mg total) by mouth every 8 (eight) hours as needed for mild pain, moderate pain, fever or headache. 07/22/17  Yes Colbert Ewing, MD  aspirin EC 81 MG EC tablet Take 1 tablet (81 mg total) by mouth daily. 12/10/16  Yes Hoffman, Jessica Ratliff, DO  calcitRIOL (ROCALTROL) 0.25 MCG capsule Take 1 capsule (0.25 mcg total) by mouth daily. 01/15/17  Yes Sid Falcon, MD  calcium acetate (PHOSLO) 667 MG capsule Take 2 capsules (1,334 mg total) by mouth 3 (three) times daily with meals. Patient taking differently: Take 2,001 mg by mouth 3 (three) times daily with meals.  02/22/17  Yes  Lacroce, Hulen Shouts, MD  cloNIDine (CATAPRES - DOSED IN MG/24 HR) 0.2 mg/24hr patch Place 1 patch (0.2 mg total) onto the skin every 7 (seven) days. 07/09/17  Yes Sid Falcon, MD  ferric citrate (AURYXIA) 1 GM 210 MG(Fe) tablet Take 210 mg by mouth daily.   Yes [provider]  nitroGLYCERIN (NITROSTAT) 0.3 MG SL tablet Place 1 tablet (0.3 mg total) under the tongue every 5 (five) minutes as needed for chest pain. 02/27/17  Yes Sid Falcon, MD  SENSIPAR 60 MG tablet Take 1 tablet (60 mg total) by mouth daily. 01/15/17  Yes Sid Falcon, MD  amLODipine (NORVASC) 10 MG tablet Take 1/2 tablet by mouth according to prior schedule Patient not taking: Reported on 10/24/2017 09/20/17   Molt, Bethany, DO   Current Facility-Administered Medications  Medication Dose Route Frequency Provider Last Rate Last Dose  . acetaminophen (TYLENOL) tablet 650 mg  650 mg Oral Q6H PRN Kalman Shan Ratliff, DO       Or  . acetaminophen (TYLENOL) suppository 650 mg  650 mg Rectal Q6H PRN Hoffman, Jessica Ratliff, DO      . cefTRIAXone (ROCEPHIN) 2 g in sodium chloride 0.9 % 100 mL IVPB  2 g Intravenous Q24H Oda Kilts, MD      . fentaNYL (SUBLIMAZE) injection 25 mcg  25 mcg Intravenous Q2H PRN Kalman Shan Ratliff, DO   25 mcg at 10/25/17 0950  . heparin injection 5,000 Units  5,000 Units Subcutaneous Q8H HoffmanJanett Billow Ratliff, DO   5,000 Units at 10/25/17 0557  . hydrALAZINE (APRESOLINE) injection 10 mg  10 mg Intravenous Q8H PRN Katherine Roan, MD   10 mg at 10/25/17 0556  . iopamidol (ISOVUE-300) 61 % injection 30 mL  30 mL Oral Once PRN Malvin Johns, MD      . metroNIDAZOLE (FLAGYL) IVPB 500 mg  500 mg Intravenous Q8H Hoffman, Jessica Ratliff, DO   Stopped at 10/25/17 1100  . promethazine (PHENERGAN) injection 12.5 mg  12.5 mg Intravenous Q6H PRN Kalman Shan Ratliff, DO   12.5 mg at 10/25/17 0117  . ramelteon (ROZEREM) tablet 8 mg  8 mg Oral QHS Katherine Roan, MD   8  mg at 10/24/17 2315  . senna-docusate (Senokot-S) tablet 1 tablet  1 tablet Oral QHS PRN Hoffman, Jessica Ratliff, DO      . sodium chloride 0.9 % bolus 500 mL  500 mL Intravenous Once Katherine Roan, MD 125 mL/hr at 10/25/17 1000 500 mL at 10/25/17 1000     ROS: As per HPI otherwise negative.  Physical Exam: Vitals:   10/24/17 1854 10/24/17 2149 10/25/17 0535 10/25/17 1309  BP: (!) 184/80 (!) 177/80 (!) 179/76 (!) 176/77  Pulse: (!) 110 (!) 110 (!) 106 (!) 102  Resp: 20 20 20  (!) 24  Temp: 97.6 F (36.4 C) 98 F (36.7 C) 98.2 F (36.8 C) 98.1 F (36.7 C)  TempSrc: Oral Oral Oral Oral  SpO2: 97% 98% 92% 98%  Weight: 60.7 kg (133 lb 13.1 oz)     Height: 5\' 3"  (1.6 m)        General: Frail elderly female NAD  Head: NCAT sclera not icteric MMM Neck: Supple. No JVD No masses Lungs: CTA bilaterally without wheezes, rales, or rhonchi. Breathing is unlabored. Heart: Tachy rhythm 2/6 SEM  Abdomen: soft + mild LLQ tenderness  Lower extremities:without edema or ischemic changes, no open wounds  Neuro: A & O  X 3. Moves all extremities spontaneously. Psych:  Responds to questions appropriately with a normal affect. Dialysis Access: LUE AVF +bruit   Labs: Basic Metabolic Panel: Recent Labs  Lab 10/23/17 2054 10/24/17 2134  NA 137 142  K 3.0* 3.5  CL 92* 97*  CO2 29 25  GLUCOSE 109* 136*  BUN 20 33*  CREATININE 6.03* 7.78*  CALCIUM 10.6* 9.7  PHOS  --  7.8*   Liver Function Tests: Recent Labs  Lab 10/23/17 2054 10/24/17 2134  AST 13*  --   ALT 8*  --   ALKPHOS 49  --   BILITOT 0.5  --   PROT 7.4  --   ALBUMIN 3.6 3.5   Recent Labs  Lab 10/23/17 2054  LIPASE 40   No results for input(s): AMMONIA in the last 168 hours. CBC: Recent Labs  Lab 10/23/17 2054  WBC 4.6  HGB 11.9*  HCT 37.1  MCV 97.9  PLT 209   Cardiac Enzymes: No results for input(s): CKTOTAL, CKMB, CKMBINDEX, TROPONINI in the last 168 hours. CBG: No results for input(s): GLUCAP in the  last 168 hours. Iron Studies: No results for input(s): IRON, TIBC, TRANSFERRIN, FERRITIN in the last 72 hours. Studies/Results: Ct Abdomen Pelvis Wo Contrast  Result Date: 10/24/2017 CLINICAL DATA:  Pt c/o LLQ, LEFT groin pain, with nausea,constipation x 3-4 days; Hx of umbilical hernia repair EXAM: CT ABDOMEN AND PELVIS WITHOUT CONTRAST TECHNIQUE: Multidetector CT imaging of the abdomen and pelvis was performed following the standard protocol without IV contrast. COMPARISON:  06/21/2016 FINDINGS: Lower chest: There is extensive atherosclerotic calcification of the aortic valve and mitral annulus. Heart size is UPPER normal. No pericardial effusion. Lung bases are unremarkable. Hepatobiliary: Liver is homogeneous accounting for noncontrast exam. Status post cholecystectomy. Pancreas: Unremarkable. No pancreatic ductal dilatation or surrounding inflammatory changes. Spleen: Normal in size without focal abnormality. Adrenals/Urinary Tract: Adrenal glands are normal. The kidneys are small, 6.5 centimeters on the RIGHT and 6.9 centimeters on the LEFT. No hydronephrosis or suspicious renal mass. No intrarenal or ureteral stones. Stomach/Bowel: Stomach and small bowel loops are normal in appearance. There are numerous colonic diverticula, particularly involving the sigmoid colon. No evidence for acute diverticulitis. The appendix is well seen and has a normal appearance. Vascular/Lymphatic: There is atherosclerotic calcification of the abdominal aorta not associated with aneurysm. No retroperitoneal or mesenteric adenopathy. Reproductive: The uterus is enlarged and contains numerous calcified fibroids. No adnexal mass. Other: No free pelvic fluid. Anterior abdominal wall is unremarkable. No abdominal wall hernia. Musculoskeletal: Degenerative changes and numerous Schmorl's nodes are present. Vacuum disc phenomenon and endplate changes are identified at L2-3. IMPRESSION: 1. No bowel obstruction or abscess. 2. Colonic  diverticulosis without acute diverticulitis. 3. Mitral and aortic valve calcifications. 4. Cholecystectomy. 5. Diminutive kidneys without focal mass or hydronephrosis. 6. Aortic atherosclerosis.  (ICD10-I70.0) 7. Enlarged uterus with multiple calcified fibroids. 8. Degenerative disc disease. Electronically Signed   By: Nolon Nations M.D.   On: 10/24/2017 11:56   Dg Chest 2 View  Result  Date: 10/24/2017 CLINICAL DATA:  Chest pain and cough.  Renal failure EXAM: CHEST - 2 VIEW COMPARISON:  September 19, 2017 FINDINGS: There is a small left pleural effusion. There is no edema or consolidation. Heart is mildly enlarged with pulmonary vascularity within normal limits. There is aortic atherosclerosis. No bone lesions. No adenopathy. IMPRESSION: Small left pleural effusion. No edema or consolidation. Heart mildly enlarged. Aortic atherosclerosis present. Aortic Atherosclerosis (ICD10-I70.0). Electronically Signed   By: Lowella Grip III M.D.   On: 10/24/2017 07:58    Dialysis Orders: [facility] M[facility]  4h 180NRe 300/800 EDW 60kg 2K/2.5Ca  LUE AVF Hep 3000U Venofer 100mg  IV x10 until 4/24 Mircera 17mcg IV q 2weeks dosed 4/10 Calcitriol 2.0mg  PO TIW Auryxia 1 tab TID qac  Sensipar 60mg  PO bid   Assessment/Plan: 1.  Abd pain/N/V - Colonic diverticulosis on Abd CT. Ceftriaxone per pharmacy. GI panel/Blood cultures pending. 2.  ESRD -  MWF. Missed Friday but no urgent HD needs today. Will follow 3.  Hypertension/volume  - BP elevated. Clonidine patch 0.2mg   on OP med list. Hasn't been tolerating PO meds. Vara Guardian not appear volume overload - Even UF on HD  4.  Anemia  - Hgb 11.9 On ESA as outpatient  5.  Metabolic bone disease -  Continue Calcitriol/Auryxia binder/Sensipar when eating 6.  Nutrition - NPO at present  Lynnda Child PA-C [place] Pager [phone] 10/25/2017, 1:59 PM   Pt seen, examined and agree w A/P as above. ESRD pt with abd pain/ N/V and suspected acute diverticulitis.   CT showing acute diverticulitis.  No indication for acute HD need today.  Will follow.  Kelly Splinter MD [employer] pager [phone]   10/25/2017, 3:45 PM

## 2017-10-25 NOTE — Progress Notes (Signed)
   Subjective: Pt reports great improvement in abdominal pain, main complaint today is back pain from the uncomfortable bed.  Still nauseous but improved, keeping down fluids well, still having trouble with soft solids.  No bowel movements but doesn't feel constipated says she hasn't eaten enough to have one.    Objective:  Vital signs in last 24 hours: Vitals:   10/24/17 1854 10/24/17 2149 10/25/17 0535 10/25/17 1309  BP: (!) 184/80 (!) 177/80 (!) 179/76 (!) 176/77  Pulse: (!) 110 (!) 110 (!) 106 (!) 102  Resp: 20 20 20  (!) 24  Temp: 97.6 F (36.4 C) 98 F (36.7 C) 98.2 F (36.8 C) 98.1 F (36.7 C)  TempSrc: Oral Oral Oral Oral  SpO2: 97% 98% 92% 98%  Weight: 133 lb 13.1 oz (60.7 kg)     Height: 5\' 3"  (1.6 m)      Constitutional: She is oriented to person, place, and time. No distress.  HENT:  Head: Normocephalic and atraumatic.  Eyes: Right eye exhibits no discharge. Left eye exhibits no discharge. No scleral icterus.  Cardiovascular: No JVD, Normal rate and regular rhythm. Exam reveals no gallop and no friction rub.  Murmur (3/6 systolic murmur) heard. Pulmonary/Chest: Effort normal and breath sounds normal. No respiratory distress. She has no wheezes. She has no rales. She exhibits no tenderness.  Abdominal: Soft. Bowel sounds are normal. She exhibits no distension and no mass. There is tenderness (Inferior LLQ much improved from day prior, also some mild tenderness LUQ today). There is no rebound and no guarding.  Musculoskeletal: She exhibits no LE edema. She exhibits no tenderness.  Neurological: She is alert and oriented to person, place, and time.  Skin: Skin is warm and dry. She is not diaphoretic.  Psychiatric: She has a normal mood and affect.    Assessment/Plan:  Principal Problem:   Intractable nausea and vomiting Active Problems:   Diverticulitis of colon   End stage renal disease (HCC)  Diverticulitis: Pt arrives with a physical exam and history concerning  for diverticulitis.  CT shows extensive diverticulosis no evidence of diverticulitis however pt did not receive IV or oral contrast.     -continue ceftriaxone and metronidazole -pt dry on exam gentle IV fluids as pt ESRD, pt also getting some fluid through abx, tolerating PO fluids better today -phenergan 12.5mg  Q6 PRN -fentanyl 62mcg PRN Q2   ESRD: on MWF dialysis missed Friday session   -will consult nephrology in am no acute need for dialysis   Hypokalemia: resolved after pt treated resulting in decreased nausea and vomiting   -continue to monitor   HTN: Pt on clonidine patch 0.2mg  q 7 days, amlodipine 10mg     -not due for patch just replaced on Tuesday -will use hydralazine 10mg  IV Q8    Dispo: Anticipated discharge in approximately 2-3 day(s).   Katherine Roan, MD 10/25/2017, 5:09 PM Vickki Muff MD PGY-1 Internal Medicine Pager # [phone]

## 2017-10-26 LAB — CBC
HCT: 36.9 % (ref 36.0–46.0)
Hemoglobin: 12 g/dL (ref 12.0–15.0)
MCH: 31.9 pg (ref 26.0–34.0)
MCHC: 32.5 g/dL (ref 30.0–36.0)
MCV: 98.1 fL (ref 78.0–100.0)
Platelets: 215 10*3/uL (ref 150–400)
RBC: 3.76 MIL/uL — ABNORMAL LOW (ref 3.87–5.11)
RDW: 17.8 % — ABNORMAL HIGH (ref 11.5–15.5)
WBC: 12.4 10*3/uL — ABNORMAL HIGH (ref 4.0–10.5)

## 2017-10-26 LAB — COMPREHENSIVE METABOLIC PANEL
ALT: 9 U/L — ABNORMAL LOW (ref 14–54)
AST: 22 U/L (ref 15–41)
Albumin: 3.3 g/dL — ABNORMAL LOW (ref 3.5–5.0)
Alkaline Phosphatase: 46 U/L (ref 38–126)
Anion gap: 21 — ABNORMAL HIGH (ref 5–15)
BUN: 55 mg/dL — ABNORMAL HIGH (ref 6–20)
CO2: 19 mmol/L — ABNORMAL LOW (ref 22–32)
Calcium: 9.6 mg/dL (ref 8.9–10.3)
Chloride: 98 mmol/L — ABNORMAL LOW (ref 101–111)
Creatinine, Ser: 9.5 mg/dL — ABNORMAL HIGH (ref 0.44–1.00)
GFR calc Af Amer: 4 mL/min — ABNORMAL LOW (ref >60)
GFR calc non Af Amer: 4 mL/min — ABNORMAL LOW (ref >60)
Glucose, Bld: 91 mg/dL (ref 65–99)
Potassium: 3.6 mmol/L (ref 3.5–5.1)
Sodium: 138 mmol/L (ref 135–145)
Total Bilirubin: 0.9 mg/dL (ref 0.3–1.2)
Total Protein: 6.8 g/dL (ref 6.5–8.1)

## 2017-10-26 LAB — MAGNESIUM: Magnesium: 2.1 mg/dL (ref 1.7–2.4)

## 2017-10-26 LAB — LACTIC ACID, PLASMA: Lactic Acid, Venous: 1.7 mmol/L (ref 0.5–1.9)

## 2017-10-26 LAB — PHOSPHORUS: Phosphorus: 7.7 mg/dL — ABNORMAL HIGH (ref 2.5–4.6)

## 2017-10-26 MED ORDER — POLYETHYLENE GLYCOL 3350 17 G PO PACK
17.0000 g | PACK | Freq: Two times a day (BID) | ORAL | Status: AC
  Administered 2017-10-26 (×2): 17 g via ORAL
  Filled 2017-10-26 (×2): qty 1

## 2017-10-26 MED ORDER — SODIUM CHLORIDE 0.9 % IV SOLN
INTRAVENOUS | Status: AC
  Administered 2017-10-26: 16:00:00 via INTRAVENOUS

## 2017-10-26 MED ORDER — LIDOCAINE 5 % EX PTCH
1.0000 | MEDICATED_PATCH | CUTANEOUS | Status: DC
  Administered 2017-10-26 – 2017-10-27 (×2): 1 via TRANSDERMAL
  Filled 2017-10-26 (×2): qty 1

## 2017-10-26 MED ORDER — SENNOSIDES-DOCUSATE SODIUM 8.6-50 MG PO TABS
1.0000 | ORAL_TABLET | Freq: Two times a day (BID) | ORAL | Status: AC
  Administered 2017-10-26 – 2017-10-27 (×3): 1 via ORAL
  Filled 2017-10-26 (×3): qty 1

## 2017-10-26 MED ORDER — AMLODIPINE BESYLATE 5 MG PO TABS
5.0000 mg | ORAL_TABLET | Freq: Every day | ORAL | Status: DC
  Administered 2017-10-26 – 2017-10-28 (×3): 5 mg via ORAL
  Filled 2017-10-26 (×3): qty 1

## 2017-10-26 NOTE — Progress Notes (Signed)
Received a 2.2 lactic acid from the lab and the physician on call was notified.  Because the patient is a dialysis patient, encourage patient to orally consume more liquid.  Will continue to monitor.

## 2017-10-26 NOTE — Progress Notes (Signed)
   Subjective: Patient was evaluated this morning. She was resting comfortably in bed. She states she has been vomiting overnight and describes the vomit as phlegm. She has been able to tolerate some liquids. She continues to have left lower quadrant pain.  Objective:  Vital signs in last 24 hours: Vitals:   10/25/17 1309 10/25/17 1901 10/25/17 2128 10/26/17 0510  BP: (!) 176/77 (!) 155/68 (!) 166/74 (!) 175/81  Pulse: (!) 102 (!) 107 (!) 109 (!) 113  Resp: (!) 24  18 17   Temp: 98.1 F (36.7 C)  97.7 F (36.5 C) 98.3 F (36.8 C)  TempSrc: Oral  Oral Oral  SpO2: 98%  97% 97%  Weight:      Height:       Physical Exam  Constitutional: She is well-developed, well-nourished, and in no distress.  Cardiovascular: Normal rate and regular rhythm. Exam reveals no gallop and no friction rub.  No murmur heard. tachycardic  Pulmonary/Chest: Effort normal and breath sounds normal. No respiratory distress. She has no wheezes. She has no rales.  Abdominal: Soft. She exhibits no distension. There is tenderness.  Tenderness to left lower quadrant  Skin: Skin is warm and dry.    Assessment/Plan:  Principal Problem:   Intractable nausea and vomiting Active Problems:   Diverticulitis of colon   End stage renal disease ([place])  Diverticulitis Patient presented with clinical picture concerning for diverticulitis.  She is being treated with ceftriaxone and metronidazole. After initiation of antibiotics and bowel rest patient has had minimal improvement in her abdominal pain.  She has remained afebrile but now with leukocytosis. She does continue to vomit however she states it is mostly phlegm.  Will continue with clear liquid diet.  May need to repeat CT abd/pelvis if does not improve since initial was without contrast. -continue ceftriaxone and metronidazole -phenergan 12.5mg  Q6 PRN - clear liquid diet  ESRD On MWF dialysis, missed Friday session -Nephrology  following.  Hypokalemia Resolved after pt treated resulting in decreased nausea and vomiting in the setting of ESRD -continue to monitor - bmet pending  HTN Pt on clonidine patch 0.2mg  q 7 days, amlodipine 5mg .  She is not due for patch just replaced on Tuesday -will use hydralazine 10mg  IV Q8 -on clonidine patch, restart amlodipine  Non sustained Vtach 18 beats of Vtach overnight.  Patient asymptomatic   -Checking electrolytes   Dispo: Anticipated discharge pending clinical improvement.   Kalman Shan [place], DO 10/26/2017, 7:44 AM Pager: [phone]

## 2017-10-26 NOTE — Progress Notes (Signed)
[facility] Progress Note  Subjective: no new c/o  Vitals:   10/25/17 1901 10/25/17 2128 10/26/17 0510 10/26/17 1013  BP: (!) 155/68 (!) 166/74 (!) 175/81 (!) 178/78  Pulse: (!) 107 (!) 109 (!) 113   Resp:  18 17   Temp:  97.7 F (36.5 C) 98.3 F (36.8 C)   TempSrc:  Oral Oral   SpO2:  97% 97%   Weight:      Height:        Inpatient medications: . amLODipine  5 mg Oral Daily  . heparin  5,000 Units Subcutaneous Q8H  . hydrALAZINE  10 mg Intravenous Q8H  . ramelteon  8 mg Oral QHS   . cefTRIAXone (ROCEPHIN)  IV Stopped (10/25/17 1830)   acetaminophen **OR** acetaminophen, iopamidol, promethazine, senna-docusate  Exam: Alert, no distress No jvd Chest CTA bilat RRR 2/6 sem Abd soft, tender in LLQ, no rebound Ext no LE edema or wounds NF< Ox 3,  LUE AVF+bruit   Dialysis: [facility] 4h   300/800  60kg  2/2.5 bath  LUE AVF   Hep 3000 Venofer 100mg  IV x10 until 4/24 Mircera 54mcg IV q 2weeks dosed 4/10 Calcitriol 2.0mg  PO TIW Auryxia 1 tab TID qac  Sensipar 60mg  PO bid    Impression/ Plan: 1. Abd pain/N/V - extensive colonic diverticulosis per CT abdomen, however, no diverticulitis seen.  Getting empiric IV rocephin/ flagyl per pharmacy. GI panel/Blood cultures pending. 2. ESRD -  MWF. Missed Friday.  Plan HD Monday.  3. Hypertension/volume  - BP elevated. Clonidine patch 0.2mg   on OP med list. Hasn't been tolerating PO meds. Low UF goal on HD tomorrow.  4. Anemia ckd  - Hgb 11.9 On ESA as outpatient  5. Metabolic bone disease -  Continue Calcitriol/Auryxia binder/Sensipar when eating 6. Nutrition - NPO at present    Kelly Splinter MD [place] pager [phone]   10/26/2017, 11:45 AM   Recent Labs  Lab 10/23/17 2054 10/24/17 2134 10/26/17 0608  NA 137 142 138  K 3.0* 3.5 3.6  CL 92* 97* 98*  CO2 29 25 19*  GLUCOSE 109* 136* 91  BUN 20 33* 55*  CREATININE 6.03* 7.78* 9.50*  CALCIUM 10.6* 9.7 9.6  PHOS  --  7.8* 7.7*    Recent Labs  Lab 10/23/17 2054 10/24/17 2134 10/26/17 0608  AST 13*  --  22  ALT 8*  --  9*  ALKPHOS 49  --  46  BILITOT 0.5  --  0.9  PROT 7.4  --  6.8  ALBUMIN 3.6 3.5 3.3*   Recent Labs  Lab 10/23/17 2054 10/26/17 0608  WBC 4.6 12.4*  HGB 11.9* 12.0  HCT 37.1 36.9  MCV 97.9 98.1  PLT 209 215   Iron/TIBC/Ferritin/ %Sat    Component Value Date/Time   IRON 33 10/15/2016 0847   TIBC 202 (L) 10/15/2016 0847   FERRITIN 127 10/15/2016 0847   IRONPCTSAT 16 10/15/2016 0847   IRONPCTSAT 12 (L) 02/09/2014 9242

## 2017-10-26 NOTE — Progress Notes (Signed)
Patient had 7 beat run V tach. Asymptomatic. Paged Dr. Aletha Halim continue to monitor.

## 2017-10-27 DIAGNOSIS — I471 Supraventricular tachycardia: Secondary | ICD-10-CM

## 2017-10-27 DIAGNOSIS — R0989 Other specified symptoms and signs involving the circulatory and respiratory systems: Secondary | ICD-10-CM

## 2017-10-27 DIAGNOSIS — R079 Chest pain, unspecified: Secondary | ICD-10-CM

## 2017-10-27 LAB — TROPONIN I: Troponin I: <0.03 ng/mL (ref <0.03)

## 2017-10-27 LAB — CBC
HCT: 37.9 % (ref 36.0–46.0)
Hemoglobin: 12.4 g/dL (ref 12.0–15.0)
MCH: 31.5 pg (ref 26.0–34.0)
MCHC: 32.7 g/dL (ref 30.0–36.0)
MCV: 96.2 fL (ref 78.0–100.0)
PLATELETS: 228 10*3/uL (ref 150–400)
RBC: 3.94 MIL/uL (ref 3.87–5.11)
RDW: 17.4 % — ABNORMAL HIGH (ref 11.5–15.5)
WBC: 11 10*3/uL — ABNORMAL HIGH (ref 4.0–10.5)

## 2017-10-27 LAB — RENAL FUNCTION PANEL
Albumin: 3.2 g/dL — ABNORMAL LOW (ref 3.5–5.0)
Anion gap: 21 — ABNORMAL HIGH (ref 5–15)
BUN: 63 mg/dL — ABNORMAL HIGH (ref 6–20)
CALCIUM: 9.2 mg/dL (ref 8.9–10.3)
CHLORIDE: 96 mmol/L — AB (ref 101–111)
CO2: 17 mmol/L — ABNORMAL LOW (ref 22–32)
CREATININE: 9.95 mg/dL — AB (ref 0.44–1.00)
GFR calc Af Amer: 4 mL/min — ABNORMAL LOW (ref >60)
GFR calc non Af Amer: 3 mL/min — ABNORMAL LOW (ref >60)
GLUCOSE: 78 mg/dL (ref 65–99)
Phosphorus: 7.4 mg/dL — ABNORMAL HIGH (ref 2.5–4.6)
Potassium: 3.2 mmol/L — ABNORMAL LOW (ref 3.5–5.1)
SODIUM: 134 mmol/L — AB (ref 135–145)

## 2017-10-27 MED ORDER — PROMETHAZINE HCL 25 MG/ML IJ SOLN
12.5000 mg | Freq: Four times a day (QID) | INTRAMUSCULAR | Status: DC
  Administered 2017-10-27 – 2017-10-28 (×4): 12.5 mg via INTRAVENOUS
  Filled 2017-10-27 (×4): qty 1

## 2017-10-27 MED ORDER — NITROGLYCERIN 0.4 MG SL SUBL: 0.4000 mg | SUBLINGUAL_TABLET | SUBLINGUAL | Status: DC | PRN

## 2017-10-27 NOTE — Procedures (Signed)
Stable on treatment but 1:30 hr into treatment c/o feeling bad with chest pain "like an elephant on my chest".  Reports she has a "hernia" and has had similar discomfort in the past.   No hemodynamic instability. Has a hx of AS but no CAD. Lungs clear, Cor RRR, no MS discomfort. We will stop treatment and notify primary team. Estanislado Emms, MD

## 2017-10-27 NOTE — Progress Notes (Addendum)
   Subjective: Pt still complaining of abdominal pain feels like it is about the same as day prior.  Still nauseous, tolerating liquids but not solids, still reports vomiting intermittently throughout the day.  Had some chest pain during dialysis session today.  No fevers, not SOB.     Objective:  Vital signs in last 24 hours: Vitals:   10/27/17 0725 10/27/17 0730 10/27/17 0830 10/27/17 0855  BP: (!) 171/81 (!) 171/87 (!) 149/71 (!) 142/68  Pulse: (!) 106 (!) 103 (!) 105 (!) 104  Resp: (!) 25 (!) 26 (!) 23 (!) 23  Temp:    97.8 F (36.6 C)  TempSrc:    Oral  SpO2:    97%  Weight:    136 lb 14.5 oz (62.1 kg)  Height:       Constitutional: She is oriented to person, place, and time. No distress.  HENT:  Head: Normocephalic and atraumatic.  Eyes: Right eye exhibits no discharge. Left eye exhibits no discharge. No scleral icterus.  Cardiovascular: JVD, Normal rate and regular rhythm. Exam reveals no gallop and no friction rub.  Murmur (3/6 systolic murmur) heard. Pulmonary/Chest: Effort normal and breath sounds normal. No respiratory distress. She has no wheezes. She has Rales in RLF and LLF worse on RLF. She exhibits no tenderness.  Abdominal: Soft. Bowel sounds are normal. She exhibits no distension and no mass. There is tenderness (Inferior LLQ  also some mild tenderness LUQ today). There is no rebound and no guarding.  Musculoskeletal: She exhibits no LE edema. She exhibits no tenderness.  Neurological: She is alert and oriented to person, place, and time.  Skin: Skin is warm and dry. She is not diaphoretic.  Psychiatric: She has a normal mood and affect.    Assessment/Plan:  Principal Problem:   Intractable nausea and vomiting Active Problems:   Diverticulitis of colon   End stage renal disease (HCC)  Diverticulitis: Pt arrives with a physical exam and history concerning for diverticulitis.  CT shows extensive diverticulosis no evidence of diverticulitis however pt did not  receive IV or oral contrast.     -continue ceftriaxone and metronidazole -phenergan 12.5mg  Q6 PRN will schedule and change to hold if not nauseous as pt not receiving  -fentanyl 66mcg PRN Q2   ESRD: on MWF dialysis missed Friday session   -will consult nephrology in am no acute need for dialysis   Hypokalemia: resolved after pt treated resulting in decreased nausea and vomiting   -continue to monitor   HTN: Pt on clonidine patch 0.2mg  q 7 days, amlodipine 10mg     -not due for patch just replaced on Tuesday -will use hydralazine 10mg  IV Q8   NSVT: Pt has had several runs of Vtach last run 7 beats asymptomatic  -electrolytes stable will check VBG  -continue to monitor   Chest Pain: pt complains of chest pain 4/22 after about 1:30 of her dialysis session. She ended session early due to "feeling like an elephant sitting on her chest".  Does not radiate, no diaphoresis. +/- reproducible  -will get ECG, troponins   Dispo: Anticipated discharge in approximately 2-3 day(s).   Katherine Roan, MD 10/27/2017, 10:11 AM Vickki Muff MD PGY-1 Internal Medicine Pager # [phone]

## 2017-10-27 NOTE — Progress Notes (Signed)
Internal Medicine Attending:   I saw and examined the patient. I reviewed the resident's note and I agree with the resident's findings and plan as documented in the resident's note.  Patient complained of left-sided chest pain at hemodialysis today.  Patient describes chest pain as "elephant sitting on chest".  Chest pain is nonradiating, 10 out of 10 in intensity, reproducible to palpation, no diaphoresis, no palpitations, no shortness of breath.  Patient was initially admitted for possible diverticulitis.  We will continue ceftriaxone and metronidazole for now.  The etiology of her chest pain remains uncertain at this time (atypical, reproducible on palpation but pressure-like and patient has multiple risk factors).  EKG shows ST depressions in inferior lateral leads similar to prior EKG.  Will follow troponins.  If chest pain persists would consider sublingual nitroglycerin and oxygen via nasal cannula as needed.  If troponin elevated will start heparin drip and consult cardiology for further work-up.

## 2017-10-27 NOTE — Progress Notes (Signed)
Approximated at 7482 HD tx has been terminated with 2.5 hrs remaining  as ordered by nephrologist d/t c/o of chest pain like an elephant on my chest as stated by pt.  No orders received at this time. Per nephrologist primary MD has been informed of the situation.  Hemodynamically stable throughout HD.  Pt in no acute distress. Reports given to primary nurse.

## 2017-10-28 ENCOUNTER — Telehealth: Payer: Self-pay

## 2017-10-28 ENCOUNTER — Encounter (HOSPITAL_COMMUNITY): Payer: Self-pay | Admitting: General Practice

## 2017-10-28 ENCOUNTER — Other Ambulatory Visit: Payer: Self-pay

## 2017-10-28 DIAGNOSIS — Z885 Allergy status to narcotic agent status: Secondary | ICD-10-CM

## 2017-10-28 LAB — CBC
HCT: 38.9 % (ref 36.0–46.0)
Hemoglobin: 12.7 g/dL (ref 12.0–15.0)
MCH: 31.9 pg (ref 26.0–34.0)
MCHC: 32.6 g/dL (ref 30.0–36.0)
MCV: 97.7 fL (ref 78.0–100.0)
Platelets: 199 10*3/uL (ref 150–400)
RBC: 3.98 MIL/uL (ref 3.87–5.11)
RDW: 17.2 % — ABNORMAL HIGH (ref 11.5–15.5)
WBC: 5.8 10*3/uL (ref 4.0–10.5)

## 2017-10-28 LAB — RENAL FUNCTION PANEL
ALBUMIN: 2.9 g/dL — AB (ref 3.5–5.0)
ANION GAP: 20 — AB (ref 5–15)
BUN: 44 mg/dL — ABNORMAL HIGH (ref 6–20)
CHLORIDE: 96 mmol/L — AB (ref 101–111)
CO2: 19 mmol/L — ABNORMAL LOW (ref 22–32)
Calcium: 9 mg/dL (ref 8.9–10.3)
Creatinine, Ser: 7.57 mg/dL — ABNORMAL HIGH (ref 0.44–1.00)
GFR calc non Af Amer: 5 mL/min — ABNORMAL LOW (ref >60)
GFR, EST AFRICAN AMERICAN: 5 mL/min — AB (ref >60)
Glucose, Bld: 76 mg/dL (ref 65–99)
POTASSIUM: 3 mmol/L — AB (ref 3.5–5.1)
Phosphorus: 5.8 mg/dL — ABNORMAL HIGH (ref 2.5–4.6)
Sodium: 135 mmol/L (ref 135–145)

## 2017-10-28 MED ORDER — AMOXICILLIN-POT CLAVULANATE 400-57 MG PO CHEW: 1.0000 | CHEWABLE_TABLET | Freq: Every day | ORAL | 0 refills | Status: DC

## 2017-10-28 MED ORDER — SENNOSIDES-DOCUSATE SODIUM 8.6-50 MG PO TABS: 1.0000 | ORAL_TABLET | Freq: Every evening | ORAL | 0 refills | Status: DC | PRN

## 2017-10-28 MED ORDER — PROMETHAZINE HCL 12.5 MG PO TABS: 12.5000 mg | ORAL_TABLET | Freq: Four times a day (QID) | ORAL | 0 refills | Status: DC | PRN

## 2017-10-28 MED ORDER — CALCITRIOL 0.25 MCG PO CAPS: 2.0000 ug | ORAL_CAPSULE | ORAL | Status: DC

## 2017-10-28 MED ORDER — CINACALCET HCL 30 MG PO TABS
60.0000 mg | ORAL_TABLET | Freq: Every day | ORAL | Status: DC
  Filled 2017-10-28: qty 2

## 2017-10-28 MED ORDER — FERRIC CITRATE 1 GM 210 MG(FE) PO TABS
420.0000 mg | ORAL_TABLET | Freq: Three times a day (TID) | ORAL | Status: DC
  Administered 2017-10-28: 420 mg via ORAL
  Filled 2017-10-28 (×2): qty 2

## 2017-10-28 MED ORDER — POLYETHYLENE GLYCOL 3350 17 G PO PACK: 17.0000 g | PACK | Freq: Every day | ORAL | 0 refills | Status: DC

## 2017-10-28 MED ORDER — METRONIDAZOLE IN NACL 5-0.79 MG/ML-% IV SOLN
500.0000 mg | Freq: Three times a day (TID) | INTRAVENOUS | Status: DC
  Administered 2017-10-28: 500 mg via INTRAVENOUS
  Filled 2017-10-28: qty 100

## 2017-10-28 NOTE — Care Management Note (Addendum)
Case Management Note  Patient Details  Name: Nancy [name] MRN: [id number] Date of Birth: 1941/05/03  Subjective/Objective:           Acute diverticulitis, CP. Hx of ESRD on Monday Wednesday Friday dialysis, aortic stenosis, mitral stenosis. Lives alone. [employer] provide transportation to dialysis center.    [name] (Grandaughter) [name] (Daughter)     [phone]      PCP: Aldine Contes  Action/Plan: Transition to home. States daughter to provide transportation ton home.  Expected Discharge Date:  10/28/17               Expected Discharge Plan:  Home/Self Care  In-House Referral:     Discharge planning Services  CM Consult  Post Acute Care Choice:    Choice offered to:     DME Arranged:   N/A DME Agency:    N/A  HH Arranged:   N/A HH Agency:   N/A  Status of Service:  Completed, signed off  If discussed at [place] of Stay Meetings, dates discussed:    Additional Comments:  Sharin Mons, RN 10/28/2017, 2:40 PM

## 2017-10-28 NOTE — Progress Notes (Signed)
[facility] Progress Note   Subjective:  Seen in room, up OOB Nauseated, doesn't like diet  Denies CP,SB today  Objective Vitals:   10/27/17 1255 10/27/17 2133 10/28/17 0505 10/28/17 0857  BP: (!) 163/65 (!) 159/74 (!) 124/55 (!) 162/75  Pulse: (!) 108 (!) 105 (!) 113 (!) 107  Resp: 20 18 18    Temp:  97.6 F (36.4 C) 97.7 F (36.5 C)   TempSrc:      SpO2: 98% 98% 96%   Weight:      Height:       Physical Exam General: Elderly female, NAD Heart: 2/6 SEM Lungs: CTAB  Abdomen: soft NT Extremities: No LE edema  Dialysis Access: LUE AVF +bruit   Dialysis: [facility] M[facility] 4h   300/800  60kg  2/2.5 bath  LUE AVF   Hep 3000 Venofer 100mg  IV x10 until 4/24 Mircera 67mcg IV q 2weeks dosed 4/10 Calcitriol 2.0mg  PO TIW Auryxia 1 tab TID qac  Sensipar 60mg  PO bid   Impression/ Plan: 1. Abd pain/N/V - extensive colonic diverticulosis per CT abdomen, however, no diverticulitis seen.  Getting empiric IV rocephin/ flagyl per pharmacy. BC no growth 2. Chest pain - reported during HD Monday. Resolved today. Troponins flat 3. ESRD - MWF.Next HD tomorrow on schedule. Use added K bath  4. Hypertension/volume - BP elevated. Clonidine patch 0.2mg  on OP med list. Hasn't been tolerating PO meds. No volume overload on exam  5. Anemia ckd - Hgb 11-12. No ESA needs currently  6. Metabolic bone disease - Continue Calcitriol/Auryxia binder/Sensipar when eating 7. Nutrition - Renal diet/vitamins as tolerated    Lynnda Child PA-C [facility] Pager [phone] 10/28/2017,10:30 AM  LOS: 4 days   Additional Objective Labs: Basic Metabolic Panel: Recent Labs  Lab 10/26/17 0608 10/27/17 0531 10/28/17 0418  NA 138 134* 135  K 3.6 3.2* 3.0*  CL 98* 96* 96*  CO2 19* 17* 19*  GLUCOSE 91 78 76  BUN 55* 63* 44*  CREATININE 9.50* 9.95* 7.57*  CALCIUM 9.6 9.2 9.0  PHOS 7.7* 7.4* 5.8*   CBC: Recent Labs  Lab 10/23/17 2054 10/26/17 0608  10/27/17 0630 10/28/17 0418  WBC 4.6 12.4* 11.0* 5.8  HGB 11.9* 12.0 12.4 12.7  HCT 37.1 36.9 37.9 38.9  MCV 97.9 98.1 96.2 97.7  PLT 209 215 228 199   Blood Culture    Component Value Date/Time   SDES BLOOD RIGHT WRIST 10/24/2017 0811   SPECREQUEST  10/24/2017 0811    BOTTLES DRAWN AEROBIC AND ANAEROBIC Blood Culture results may not be optimal due to an inadequate volume of blood received in culture bottles   CULT  10/24/2017 0811    NO GROWTH 3 DAYS Performed at [facility], [place] [address] [place], [place] [address]    REPTSTATUS PENDING 10/24/2017 9628    Cardiac Enzymes: Recent Labs  Lab 10/27/17 1048 10/27/17 1550  TROPONINI <0.03 <0.03   CBG: No results for input(s): GLUCAP in the last 168 hours. Iron Studies: No results for input(s): IRON, TIBC, TRANSFERRIN, FERRITIN in the last 72 hours. Lab Results  Component Value Date   INR 1.30 02/17/2017   INR 1.14 09/21/2016   INR 1.06 07/13/2012   Medications: . cefTRIAXone (ROCEPHIN)  IV 2 g (10/27/17 1741)  . metronidazole 500 mg (10/28/17 0857)   . amLODipine  5 mg Oral Daily  . heparin  5,000 Units Subcutaneous Q8H  . hydrALAZINE  10 mg Intravenous Q8H  . lidocaine  1 patch Transdermal Q24H  . promethazine  12.5 mg Intravenous Q6H  . ramelteon  8 mg Oral QHS

## 2017-10-28 NOTE — Consult Note (Signed)
   [facility] Inpatient Consult   10/28/2017  Nancy [name] 1941/03/02 [id number]  Patient was assessed for [facility] for community services. Patient was previously active with [facility].  Met with patient at bedside regarding being restarted with [facility] services. Consent form on file signed and brochure with [facility] information given. Patient states she has "a girl that comes in and help out."  According to records progress notes from [facility] social worker she has Nature conservation officer for personal care services. Of note" but, she lives alone.  Patient 's [facility] services does not replace or interfere with any services that are arranged by inpatient case management or social work. For additional questions or referrals please contact:  [name], RN BSN [facility] Liaison  [phone] business mobile phone Toll free office [phone]

## 2017-10-28 NOTE — Progress Notes (Signed)
Pt. States she fell out of chair onto ground. Within seconds of the process of staff coming to room patient picked herself up and was back in chair. Unwitnessed fall, pt. Reports no injuries, and she was unconcerned states "what are you going to do when I'm at home." SZP completed, MD notified, and patient assessed without injury. Will continue to monitor. Pt. Does have history of dementia, don't know if patient actually fell but protocol followed to ensure patient safety.

## 2017-10-28 NOTE — Progress Notes (Signed)
Internal Medicine Attending:   I saw and examined the patient. I reviewed the resident's note and I agree with the resident's findings and plan as documented in the resident's note.  Patient states that abdominal pain has improved and she is tolerating oral intake this morning.  Patient was initially admitted with abdominal pain and was found to have diverticulitis clinically even though CT showed no evidence of diverticulitis.  Will complete course of antibiotics with 2 more days of Augmentin.  Abdominal pain is improved and leukocytosis has resolved.  Blood pressure remains elevated for now.  We will continue with clonidine patch and increase her amlodipine to 10 mg (home dose).  If patient continues to do well should be able to to be discharged home today.  No further work-up for now.  Continue with hemodialysis per nephrology.

## 2017-10-28 NOTE — Progress Notes (Signed)
   Subjective: Pt with improvement in abdominal pain, no chest pain.  Nausea and vomiting improving.  Eating soft diet this morning when we arrived in the room.    Objective:  Vital signs in last 24 hours: Vitals:   10/27/17 1018 10/27/17 1255 10/27/17 2133 10/28/17 0505  BP: (!) 175/81 (!) 163/65 (!) 159/74 (!) 124/55  Pulse: (!) 111 (!) 108 (!) 105 (!) 113  Resp: 20 20 18 18   Temp: 97.7 F (36.5 C)  97.6 F (36.4 C) 97.7 F (36.5 C)  TempSrc: Oral     SpO2: 99% 98% 98% 96%  Weight:      Height:       Constitutional: She is oriented to person, place, and time. No distress.  HENT:  Head: Normocephalic and atraumatic.  Eyes: Right eye exhibits no discharge. Left eye exhibits no discharge. No scleral icterus.  Cardiovascular:, Normal rate and regular rhythm. Exam reveals no gallop and no friction rub.  Murmur (3/6 systolic murmur) heard. Pulmonary/Chest: Effort normal and breath sounds normal. No respiratory distress. She has no wheezes. She has Rales in RLF and  Decreased breath sounds LLF. She exhibits no tenderness.  Abdominal: Soft. Bowel sounds are normal. She exhibits no distension and no mass. There is tenderness (Inferior LLQ  also some mild tenderness LUQ today). There is no rebound and no guarding.  Musculoskeletal: She exhibits no LE edema. She exhibits no tenderness.  Neurological: She is alert and oriented to person, place, and time.  Skin: Skin is warm and dry. She is not diaphoretic.  Psychiatric: She has a normal mood and affect.    Assessment/Plan:  Principal Problem:   Intractable nausea and vomiting Active Problems:   Diverticulitis of colon   End stage renal disease (HCC)  Diverticulitis: Pt arrives with a physical exam and history concerning for diverticulitis.  CT shows extensive diverticulosis no evidence of diverticulitis however pt did not receive IV or oral contrast.     -will switch to augmentin for two further days of therapy -phenergan 12.5mg  Q6  PRN will switch to PO outpatient -no longer requiring fentanyl   ESRD: on MWF dialysis missed Friday session   -pt will pick up her dialysis schedule beginning tomorrow outpatient   Hypokalemia: resolved after pt treated resulting in decreased nausea and vomiting   -continue to monitor   HTN: Pt on clonidine patch 0.2mg  q 7 days, amlodipine 10mg     -not due for patch just replaced on Tuesday -will use hydralazine 10mg  IV Q8   NSVT: Pt has had several runs of Vtach last run 7 beats asymptomatic  -electrolytes stable   -continue to monitor   Chest Pain: pt complains of chest pain 4/22 after about 1:30 of her dialysis session. She ended session early due to "feeling like an elephant sitting on her chest".  Does not radiate, no diaphoresis.  reproducible  -ACS workup negative, pain reproducible, she has used the elephant descriptor in the past for her msk pain.     Dispo: Anticipated discharge home today as pt medically stable  Katherine Roan, MD 10/28/2017, 7:25 AM Vickki Muff MD PGY-1 Internal Medicine Pager # [phone]

## 2017-10-28 NOTE — Telephone Encounter (Signed)
Hospital TOC per Dr Shan Levans, discharge 10/28/2017, appt 10/30/2017

## 2017-10-28 NOTE — Care Management Important Message (Signed)
Important Message  Patient Details  Name: Nancy [name] MRN: [id number] Date of Birth: October 29, 1940   Medicare Important Message Given:  Yes    Orbie Pyo 10/28/2017, 2:20 PM

## 2017-10-28 NOTE — Progress Notes (Signed)
Patient discharge teaching given, including activity, diet, follow-up appoints, and medications. Patient verbalized understanding of all discharge instructions. IV access was d/c'd. Vitals are stable. Skin is intact except as charted in most recent assessments. Pt to be escorted out by NT, to be driven home by family.   Need MD to change pharmacy location for picking up meds.

## 2017-10-29 ENCOUNTER — Telehealth: Payer: Self-pay | Admitting: Internal Medicine

## 2017-10-29 LAB — CULTURE, BLOOD (ROUTINE X 2)
CULTURE: NO GROWTH
Culture: NO GROWTH
SPECIAL REQUESTS: ADEQUATE

## 2017-10-29 NOTE — Telephone Encounter (Signed)
Sent prescriptions to pts new pharmacy and informed pt that her old pharmacy has closed.

## 2017-10-29 NOTE — Telephone Encounter (Signed)
Attempted to call for hfu/ TOC, no answer, rang 15 times

## 2017-10-30 ENCOUNTER — Other Ambulatory Visit: Payer: Self-pay

## 2017-10-30 ENCOUNTER — Ambulatory Visit (INDEPENDENT_AMBULATORY_CARE_PROVIDER_SITE_OTHER): Payer: Medicare Other | Admitting: Internal Medicine

## 2017-10-30 ENCOUNTER — Inpatient Hospital Stay (HOSPITAL_COMMUNITY)
Admission: AD | Admit: 2017-10-30 | Discharge: 2017-11-04 | DRG: 070 | Disposition: A | Discharge status: Home Health | Payer: Medicare Other | Source: Ambulatory Visit | Attending: Internal Medicine | Admitting: Internal Medicine

## 2017-10-30 VITALS — BP 156/56 | HR 93 | Temp 98.3°F | Ht 62.0 in | Wt 139.4 lb

## 2017-10-30 DIAGNOSIS — I509 Heart failure, unspecified: Secondary | ICD-10-CM | POA: Diagnosis present

## 2017-10-30 DIAGNOSIS — R011 Cardiac murmur, unspecified: Secondary | ICD-10-CM | POA: Diagnosis not present

## 2017-10-30 DIAGNOSIS — N2581 Secondary hyperparathyroidism of renal origin: Secondary | ICD-10-CM | POA: Diagnosis not present

## 2017-10-30 DIAGNOSIS — Z008 Encounter for other general examination: Secondary | ICD-10-CM

## 2017-10-30 DIAGNOSIS — R45851 Suicidal ideations: Secondary | ICD-10-CM | POA: Diagnosis present

## 2017-10-30 DIAGNOSIS — Z90710 Acquired absence of both cervix and uterus: Secondary | ICD-10-CM | POA: Diagnosis not present

## 2017-10-30 DIAGNOSIS — N186 End stage renal disease: Secondary | ICD-10-CM | POA: Diagnosis not present

## 2017-10-30 DIAGNOSIS — R441 Visual hallucinations: Secondary | ICD-10-CM | POA: Diagnosis not present

## 2017-10-30 DIAGNOSIS — G9340 Encephalopathy, unspecified: Secondary | ICD-10-CM

## 2017-10-30 DIAGNOSIS — D631 Anemia in chronic kidney disease: Secondary | ICD-10-CM | POA: Diagnosis not present

## 2017-10-30 DIAGNOSIS — R410 Disorientation, unspecified: Secondary | ICD-10-CM | POA: Diagnosis not present

## 2017-10-30 DIAGNOSIS — F5089 Other specified eating disorder: Secondary | ICD-10-CM | POA: Diagnosis not present

## 2017-10-30 DIAGNOSIS — I350 Nonrheumatic aortic (valve) stenosis: Secondary | ICD-10-CM | POA: Diagnosis present

## 2017-10-30 DIAGNOSIS — Z8673 Personal history of transient ischemic attack (TIA), and cerebral infarction without residual deficits: Secondary | ICD-10-CM | POA: Diagnosis not present

## 2017-10-30 DIAGNOSIS — E785 Hyperlipidemia, unspecified: Secondary | ICD-10-CM | POA: Diagnosis not present

## 2017-10-30 DIAGNOSIS — R451 Restlessness and agitation: Secondary | ICD-10-CM | POA: Diagnosis not present

## 2017-10-30 DIAGNOSIS — K219 Gastro-esophageal reflux disease without esophagitis: Secondary | ICD-10-CM | POA: Diagnosis not present

## 2017-10-30 DIAGNOSIS — K5732 Diverticulitis of large intestine without perforation or abscess without bleeding: Secondary | ICD-10-CM

## 2017-10-30 DIAGNOSIS — Z9115 Patient's noncompliance with renal dialysis: Secondary | ICD-10-CM

## 2017-10-30 DIAGNOSIS — G9349 Other encephalopathy: Principal | ICD-10-CM | POA: Diagnosis present

## 2017-10-30 DIAGNOSIS — R159 Full incontinence of feces: Secondary | ICD-10-CM | POA: Diagnosis not present

## 2017-10-30 DIAGNOSIS — M170 Bilateral primary osteoarthritis of knee: Secondary | ICD-10-CM | POA: Diagnosis not present

## 2017-10-30 DIAGNOSIS — R079 Chest pain, unspecified: Secondary | ICD-10-CM

## 2017-10-30 DIAGNOSIS — Z5329 Procedure and treatment not carried out because of patient's decision for other reasons: Secondary | ICD-10-CM | POA: Diagnosis not present

## 2017-10-30 DIAGNOSIS — I132 Hypertensive heart and chronic kidney disease with heart failure and with stage 5 chronic kidney disease, or end stage renal disease: Secondary | ICD-10-CM | POA: Diagnosis not present

## 2017-10-30 DIAGNOSIS — Z8719 Personal history of other diseases of the digestive system: Secondary | ICD-10-CM | POA: Diagnosis not present

## 2017-10-30 DIAGNOSIS — K5730 Diverticulosis of large intestine without perforation or abscess without bleeding: Secondary | ICD-10-CM | POA: Diagnosis not present

## 2017-10-30 DIAGNOSIS — Z992 Dependence on renal dialysis: Secondary | ICD-10-CM

## 2017-10-30 DIAGNOSIS — R4182 Altered mental status, unspecified: Secondary | ICD-10-CM | POA: Diagnosis not present

## 2017-10-30 DIAGNOSIS — Z8249 Family history of ischemic heart disease and other diseases of the circulatory system: Secondary | ICD-10-CM | POA: Diagnosis not present

## 2017-10-30 DIAGNOSIS — Z9049 Acquired absence of other specified parts of digestive tract: Secondary | ICD-10-CM

## 2017-10-30 DIAGNOSIS — G4700 Insomnia, unspecified: Secondary | ICD-10-CM | POA: Diagnosis not present

## 2017-10-30 DIAGNOSIS — G9341 Metabolic encephalopathy: Secondary | ICD-10-CM

## 2017-10-30 DIAGNOSIS — Z885 Allergy status to narcotic agent status: Secondary | ICD-10-CM

## 2017-10-30 DIAGNOSIS — M25461 Effusion, right knee: Secondary | ICD-10-CM

## 2017-10-30 DIAGNOSIS — Z79899 Other long term (current) drug therapy: Secondary | ICD-10-CM | POA: Diagnosis not present

## 2017-10-30 DIAGNOSIS — R112 Nausea with vomiting, unspecified: Secondary | ICD-10-CM | POA: Diagnosis not present

## 2017-10-30 DIAGNOSIS — R251 Tremor, unspecified: Secondary | ICD-10-CM | POA: Diagnosis not present

## 2017-10-30 DIAGNOSIS — R531 Weakness: Secondary | ICD-10-CM | POA: Diagnosis present

## 2017-10-30 DIAGNOSIS — I120 Hypertensive chronic kidney disease with stage 5 chronic kidney disease or end stage renal disease: Secondary | ICD-10-CM | POA: Diagnosis not present

## 2017-10-30 DIAGNOSIS — N19 Unspecified kidney failure: Secondary | ICD-10-CM

## 2017-10-30 DIAGNOSIS — R4587 Impulsiveness: Secondary | ICD-10-CM | POA: Diagnosis not present

## 2017-10-30 MED ORDER — CINACALCET HCL 30 MG PO TABS
60.0000 mg | ORAL_TABLET | Freq: Every day | ORAL | Status: DC
  Administered 2017-11-03 – 2017-11-04 (×2): 60 mg via ORAL
  Filled 2017-10-30 (×3): qty 2

## 2017-10-30 MED ORDER — CALCITRIOL 0.25 MCG PO CAPS
0.2500 ug | ORAL_CAPSULE | Freq: Every day | ORAL | Status: DC
  Administered 2017-11-01 – 2017-11-03 (×3): 0.25 ug via ORAL
  Filled 2017-10-30 (×2): qty 1

## 2017-10-30 MED ORDER — SENNOSIDES-DOCUSATE SODIUM 8.6-50 MG PO TABS: 1.0000 | ORAL_TABLET | Freq: Every evening | ORAL | Status: DC | PRN

## 2017-10-30 MED ORDER — HEPARIN SODIUM (PORCINE) 5000 UNIT/ML IJ SOLN
5000.0000 [IU] | Freq: Three times a day (TID) | INTRAMUSCULAR | Status: DC
  Administered 2017-11-04: 5000 [IU] via SUBCUTANEOUS
  Filled 2017-10-30 (×2): qty 1

## 2017-10-30 MED ORDER — AMLODIPINE BESYLATE 5 MG PO TABS
5.0000 mg | ORAL_TABLET | Freq: Every day | ORAL | Status: DC
  Administered 2017-11-02 – 2017-11-04 (×3): 5 mg via ORAL
  Filled 2017-10-30 (×3): qty 1

## 2017-10-30 MED ORDER — CALCIUM ACETATE (PHOS BINDER) 667 MG PO CAPS
1334.0000 mg | ORAL_CAPSULE | Freq: Three times a day (TID) | ORAL | Status: DC
  Administered 2017-11-02 – 2017-11-04 (×5): 1334 mg via ORAL
  Filled 2017-10-30 (×6): qty 2

## 2017-10-30 MED ORDER — CLONIDINE HCL 0.2 MG/24HR TD PTWK
0.2000 mg | MEDICATED_PATCH | TRANSDERMAL | Status: DC
  Filled 2017-10-30: qty 1

## 2017-10-30 MED ORDER — FERRIC CITRATE 1 GM 210 MG(FE) PO TABS
210.0000 mg | ORAL_TABLET | Freq: Every day | ORAL | Status: DC
  Administered 2017-11-03: 210 mg via ORAL
  Filled 2017-10-30 (×6): qty 1

## 2017-10-30 MED ORDER — AMOXICILLIN-POT CLAVULANATE 500-125 MG PO TABS: 1.0000 | ORAL_TABLET | Freq: Every day | ORAL | Status: AC

## 2017-10-30 MED ORDER — ASPIRIN EC 81 MG PO TBEC
81.0000 mg | DELAYED_RELEASE_TABLET | Freq: Every day | ORAL | Status: DC
  Administered 2017-11-02 – 2017-11-04 (×3): 81 mg via ORAL
  Filled 2017-10-30 (×3): qty 1

## 2017-10-30 NOTE — Assessment & Plan Note (Signed)
Patient recently admitted for diverticulitis.  Here today for hospital follow-up.  Continues to have abdominal pain.  Granddaughter reports she has been avoiding PO intake due to fecal incontinence.  She has been unable to pick up her discharge medications, reports they are on back order with her pharmacy.  We are readmitting for dialysis needs.  Can continue inpatient management of diverticulitis in the meantime. -- Admit

## 2017-10-30 NOTE — Progress Notes (Signed)
Patient has been refusing vitals to be obtained by the nurse tech, lab draws by phlebotomy, and pm medication by this RN. Patient is consistently demanding to leave. RN found patient on a wheelchair beside the door and would get up and charge at the door requesting to go home. RN attempted to help patient back to the chair and patient raised a fist up at this RN.  RN notified on call IMTS MD, [place]. MD advised that she would come see patient.  10:40- MD advised RN to continue to monitor patient.  RN will continue to monitor patient.  Ermalinda Memos, RN

## 2017-10-30 NOTE — Progress Notes (Signed)
Medicine attending: I personally interviewed and briefly examined this patient on the day of the patient visit and reviewed pertinent clinical ,laboratory data  with resident physician Dr. Armstead Peaks and we discussed a management plan. Elderly woman with ESRD on dialysis recently admitted w acute diverticulitis. Still having diarrhea. Family reports she is not eating, intermittently confused, she missed dialysis yesterday. Currently oriented to person & pace but not time. Swears she had dialysis yesterday. Abdomen soft, non tender. Lab pending. Will will re-admit her to the hospital today.

## 2017-10-30 NOTE — Progress Notes (Signed)
   CC: Diverticulitis follow up  HPI:  [name] is a 77 y.o. female with past medical history outlined below here for diverticulitis follow up. For the details of today's visit, please refer to the assessment and plan.  Past Medical History:  Diagnosis Date  . Anemia   . Aortic stenosis   . Bacterial sinusitis 09/17/2011  . CHF (congestive heart failure) ([place])   . CKD (chronic kidney disease) stage 4, GFR 15-29 ml/min ([place]) 08/11/2006   Cr continues to increase. Proteinuria on UA 02/10/12.    . Colitis   . CVA (cerebrovascular accident) [facility])    New hemorrhagic per CT scan '09  . Diverticulosis of colon   . Dysfunctional uterine bleeding   . Fecal impaction ([place])   . Headache(784.0)   . Heart murmur   . HERNIORRHAPHY, HX OF 08/11/2006  . Hypertension   . OA (osteoarthritis)    bilateral knees  . Postmenopausal   . Pulmonary nodule   . TINEA CRURIS 01/12/2007    Review of Systems  Cardiovascular: Positive for chest pain.  Gastrointestinal: Positive for abdominal pain.       Fecal incontinence     Physical Exam:  Vitals:   10/30/17 1503  BP: (!) 156/56  Pulse: 93  Temp: 98.3 F (36.8 C)  TempSrc: Oral  SpO2: 100%  Weight: 139 lb 6.4 oz (63.2 kg)  Height: 5\' 2"  (1.575 m)    Constitutional: NAD, appears comfortable Cardiovascular: RRR, 3/6 systolic murmur Pulmonary/Chest: CTAB, no wheezes, rales, or rhonchi.  Abdominal: Soft, non tender, non distended. +BS.  Extremities: Warm and well perfused. Trace edema Psychiatric: Alert and oriented to person and place but confused about day of the week. Believes she was discharged yesterday.   Assessment & Plan:   See Encounters Tab for problem based charting.  Patient seen with Dr. Beryle Beams

## 2017-10-30 NOTE — Assessment & Plan Note (Addendum)
Patient is here for hospital follow-up.  She was recently discharged 2 days ago after admission for diverticulitis.  She is ESRD on dialysis, normally dialyzes Mondays, Wednesdays, and Fridays.  She is accompanied today by her granddaughter who helped provide history.  Granddaughter reports since discharge from the hospital, she has been intermittently confused, refusing medications, and refused to go to dialysis yesterday.  She has continued to have abdominal pain and has been avoiding PO intake because of fecal incontinence.  She has been unable to pick up her discharge medications from her pharmacy because they were on back order.  Her last dialysis session was 4/22 while hospitalized, however she only completed 1 and half hours due to complaint of chest pain.  Patient will be readmitted for dialysis and management of her delirium. -- Admit to inpatient

## 2017-10-30 NOTE — H&P (Signed)
Date: 10/30/2017               Patient Name:  Nancy [name] MRN: [id number]  DOB: 1941-04-22 Age / Sex: 77 y.o., female   PCP: Aldine Contes, MD         Medical Service: Internal Medicine Teaching Service         Attending Physician: Dr. Aldine Contes, MD    First Contact: Dr. Johny Chess Pager: [phone]  Second Contact: Dr. Heber Bellflower Pager: [phone]       After Hours (After 5p/  First Contact Pager: [phone]  weekends / holidays): Second Contact Pager: [phone]   Chief Complaint: Confusion, missed HD    History of Present Illness: Nancy [name] is a 77 yo F with a past medical history of ESRD on MWF HD, aortic stenosis, HTN who presented to the clinic with family who was concerned regarding the pt's mental status and missed HD.    Patient was recently admitted from 4/19 to 4/23 for abdominal pain and given clinical diagnosis of diverticulitis which improved with antibiotics and supportive care.  She was discharged to complete a course of oral [place] which she did not pick up.  Prior to discharge she received a shortened session of dialysis.  Since that time, the family states she has seemed confused and not acting like herself.  She believes she went to her normal dialysis session yesterday, however family notes that she did not attend the session.  Patient also incorrectly reports that she had a for dialysis session her outpatient center (was hospitalized this time).  Though she denies ongoing abdominal pain or other complaints stating that she feels fine, at other times she notes feeling weak yesterday and the reason for missing her dialysis.  Family also notes she has had decreased p.o. intake since discharge.  In the clinic, the patient was adamant that she did not want to be admitted and was angry with family members.  She is alert and oriented, though it is difficult to ascertain if she fully grasps the risks of missing dialysis. The level of her family's concern is her desire to  pursue IVC in the event that she would not agree to admission led to the IVC process being initiated.  While we cannot force her to undergo dialysis, we will continue evaluation to determine capacity and any underlying causes for altered mental status.  Meds:  No outpatient medications have been marked as taking for the 10/30/17 encounter [facility] Encounter).     Allergies: Allergies as of 10/30/2017 - Review Complete 10/30/2017  Allergen Reaction Noted  . Hydrocodone-acetaminophen Nausea And Vomiting and Other (See Comments)    Past Medical History:  Diagnosis Date  . Anemia   . Aortic stenosis   . Bacterial sinusitis 09/17/2011  . CHF (congestive heart failure) ([place])   . CKD (chronic kidney disease) stage 4, GFR 15-29 ml/min ([place]) 08/11/2006   Cr continues to increase. Proteinuria on UA 02/10/12.    . Colitis   . CVA (cerebrovascular accident) [facility])    New hemorrhagic per CT scan '09  . Diverticulosis of colon   . Dysfunctional uterine bleeding   . Fecal impaction ([place])   . Headache(784.0)   . Heart murmur   . HERNIORRHAPHY, HX OF 08/11/2006  . Hypertension   . OA (osteoarthritis)    bilateral knees  . Postmenopausal   . Pulmonary nodule   . TINEA CRURIS 01/12/2007    Family History: Unable to reliably obtain due  to patient's mental status.   Social History: Unable to reliably obtain due to patient's mental status.   Review of Systems: Unable to reliably obtain due to patient's mental status.   Physical Exam: In the clinic, T 98.3, HR 93, BP 156/56, 100% on RA  General: Sitting in wheelchair comfortably, no acute distress Head: Appears to be wearing wig  Eyes: Arcus senilis, pupils symmetric, watery  ENT: No pharyngeal exudate, moist mucus membranes CV: RRR, systolic murmur Resp: Clear breath sounds bilaterally, normal work of breathing, no distress  Abd: Soft, +BS, non-tender to palpation  Extr: No LE edema, LUE fistula with good thrill  Neuro: Alert and oriented to  person, place, and self, however has difficulty answering questions regarding recent events accurately, inconsistent answers. Following commands  Skin: Warm, dry     Assessment & Plan by Problem:  Confusion  Patient's family reports confusion which the patient denies.  While she is alert and oriented x3, she routinely gives inaccurate history of recent events including when/where she has received dialysis.  Various discrepancies and reported history. Given level of family concern regarding mental status, it is unclear if patient fully has capacity to understand risks associated with declining further dialysis.  Given her shortened dialysis session on day of discharge and missed session yesterday, this confusion may be result of uremic encephalopathy.  Family requested IVC which we will arrange and attempt to continue evaluation during IVC time period.  --IVC per family request, sitter --CBC, Renal Function Panel  --Psych eval in the am for consideration of capacity evaluation   ESRD on MWF HD Presenting with confusion above with missed dialysis sessions and threats to continue to not attend outpatient dialysis.  States that she has missed dialysis without dying in the past and will just go to dialysis when she feels like it, dislikes dialysis due to discomfort with AV fistula, inconvenience. --Notify Nephro in am unless labs suggest need for urgent HD --Renal function panel   Recent Diverticulitis Inconsistent history regarding resolution or persistent abdominal pain but family does note decreased po intake, pt notes feeling generally weak/fatigued yesterday morning. Did not pick up prescriptions upon discharge. Abd exam reassuring, will continue prior antibiotics prescribed on discharge.    H/o HTN Pt not wearing her clonidine patch during interview, also on amlodipine at home. Will continue home medications   Dispo: Admit patient to Observation with expected length of stay less than 2  midnights.  Signed: Tawny Asal, MD 10/30/2017, 7:20 PM  Pager: [phone]

## 2017-10-30 NOTE — Progress Notes (Signed)
Paged by nursing that patient was getting increasingly agitated and demanding to leave. She was also refusing medications, vitals, and blood draws. Went to bedside to evaluate. Patient was accompanied by family, bedside sitter also present. She was angry and demanding to leave, sitting in the wheel chair with her purse. She displayed no aggressive or combative behavior toward me. She states she wants to leave because she was just in the hospital and does not want to be here. She was complaining of back pain, and when I offered treatment she declined and stated she had tylenol at home and can take that. She has no other acute complaints and states she feels like her normal self.  When I told her she is not able to leave the hospital because of safety concerns she stated "she'll jump out the window to leave." She continued to be adamant about leaving despite my attempts to explain her reasons for hospitalization. Patient is alert to self and place, but not time. I do not feel that she has capacity at this time to make her own medical decisions.     Plan: -Patient is currently IVC  -I do not think the patient requires pharmacological or physical restraints at this time as she is currently not a harm to her self or staff, and will try to avoid these measures  -Okay to defer labs, medications, and vitals at this time unless acute change in patient's condition -Continue with bedside sitter  Arvil Chaco, MD Internal Medicine PGY1 Pager # [phone]

## 2017-10-31 DIAGNOSIS — G4700 Insomnia, unspecified: Secondary | ICD-10-CM

## 2017-10-31 DIAGNOSIS — I120 Hypertensive chronic kidney disease with stage 5 chronic kidney disease or end stage renal disease: Secondary | ICD-10-CM

## 2017-10-31 DIAGNOSIS — R441 Visual hallucinations: Secondary | ICD-10-CM

## 2017-10-31 DIAGNOSIS — Z992 Dependence on renal dialysis: Secondary | ICD-10-CM

## 2017-10-31 DIAGNOSIS — R4587 Impulsiveness: Secondary | ICD-10-CM

## 2017-10-31 DIAGNOSIS — R112 Nausea with vomiting, unspecified: Secondary | ICD-10-CM

## 2017-10-31 DIAGNOSIS — Z8719 Personal history of other diseases of the digestive system: Secondary | ICD-10-CM

## 2017-10-31 DIAGNOSIS — R011 Cardiac murmur, unspecified: Secondary | ICD-10-CM

## 2017-10-31 DIAGNOSIS — Z008 Encounter for other general examination: Secondary | ICD-10-CM

## 2017-10-31 DIAGNOSIS — Z5329 Procedure and treatment not carried out because of patient's decision for other reasons: Secondary | ICD-10-CM

## 2017-10-31 DIAGNOSIS — G9340 Encephalopathy, unspecified: Secondary | ICD-10-CM

## 2017-10-31 DIAGNOSIS — N186 End stage renal disease: Secondary | ICD-10-CM

## 2017-10-31 DIAGNOSIS — Z79899 Other long term (current) drug therapy: Secondary | ICD-10-CM

## 2017-10-31 DIAGNOSIS — R410 Disorientation, unspecified: Secondary | ICD-10-CM

## 2017-10-31 DIAGNOSIS — I350 Nonrheumatic aortic (valve) stenosis: Secondary | ICD-10-CM

## 2017-10-31 DIAGNOSIS — R4182 Altered mental status, unspecified: Secondary | ICD-10-CM

## 2017-10-31 LAB — CBC
HCT: 36.2 % (ref 36.0–46.0)
HEMOGLOBIN: 11.6 g/dL — AB (ref 12.0–15.0)
MCH: 30.9 pg (ref 26.0–34.0)
MCHC: 32 g/dL (ref 30.0–36.0)
MCV: 96.5 fL (ref 78.0–100.0)
Platelets: 198 10*3/uL (ref 150–400)
RBC: 3.75 MIL/uL — ABNORMAL LOW (ref 3.87–5.11)
RDW: 16.6 % — AB (ref 11.5–15.5)
WBC: 7.7 10*3/uL (ref 4.0–10.5)

## 2017-10-31 LAB — RENAL FUNCTION PANEL
ANION GAP: 22 — AB (ref 5–15)
Albumin: 3.2 g/dL — ABNORMAL LOW (ref 3.5–5.0)
BUN: 73 mg/dL — AB (ref 6–20)
CHLORIDE: 95 mmol/L — AB (ref 101–111)
CO2: 20 mmol/L — ABNORMAL LOW (ref 22–32)
Calcium: 8.7 mg/dL — ABNORMAL LOW (ref 8.9–10.3)
Creatinine, Ser: 10.65 mg/dL — ABNORMAL HIGH (ref 0.44–1.00)
GFR calc Af Amer: 4 mL/min — ABNORMAL LOW (ref >60)
GFR calc non Af Amer: 3 mL/min — ABNORMAL LOW (ref >60)
GLUCOSE: 117 mg/dL — AB (ref 65–99)
POTASSIUM: 3.1 mmol/L — AB (ref 3.5–5.1)
Phosphorus: 6.5 mg/dL — ABNORMAL HIGH (ref 2.5–4.6)
Sodium: 137 mmol/L (ref 135–145)

## 2017-10-31 MED ORDER — OLANZAPINE 10 MG IM SOLR
2.5000 mg | Freq: Once | INTRAMUSCULAR | Status: AC
  Administered 2017-10-31: 2.5 mg via INTRAMUSCULAR
  Filled 2017-10-31: qty 10

## 2017-10-31 NOTE — Progress Notes (Addendum)
Patient;s granddaughter was upset  becouse we are not doing yet anything to her grandmother as what the M.D had told them at the medical clinic,like doing hemodialysis to the patient and to the extend of giving her sedation, to be able to bring her to hemodialysis.She said'' She is confused,''Isn't that we need to be aggressive, like placing her on restraint/give her something''.The writer explained to her the reasons of not giving her sedation and restraint.Also we're waiting for renal and psychiarist to see her.

## 2017-10-31 NOTE — Progress Notes (Signed)
[facility] Progress Note   Subjective:  77 year old female with ESRD on HD, HTN, CV, CHF. Recent [facility] admit 4/18-4/23/19 with N/V found to have diverticulosis on abd CT and discharged on PO Augmentin.  Admitted under IVC with 2 day history of altered mental status and confusion. Since admission overnight has been refusing medications, vitals, blood draws per notes. She missed her outpatient dialysis session on Wednesday. Family says she refused just refused to go.   Seen in room with family present, still in her street clothes. Knows she's in the hospital and knows today is her dialysis day.    Objective Vitals:   10/31/17 1547  BP: (!) 165/71  Pulse: (!) 103  Resp: 18  Temp: (!) 97.5 F (36.4 C)  TempSrc: Oral  SpO2: 100%   Physical Exam General: Petite elderly female NAD  Heart: RRR 2/6 SEM  Lungs: grossly CTAB  Abdomen: BS+ NT Extremities: trace LE edema  Dialysis Access: LUE AVF +thrill   Dialysis Orders:  [facility] MWF 4h 300/800 60kg 2/2.5 bath LUE AVF Hep 3000 Venofer 100mg  IV x10 until 4/24 Mircera 36mcg IV q 2weeks dosed 4/10 Calcitriol 2.0mg  PO TIW Auryxia 1 tab TID qac  Sensipar 60mg  PO bid  Assessment/Plan: 1. AMS/Confusion - Missed last dialysis session, but does not appear overtly uremic. Psych consulted. Further w/u per primary  2. ESRD - MWF. Last HD 4/22 during previous admission. Treatment shortened to 1.5h d/t CP during HD. Agreeable to HD today. Orders written.  3. Anemia - Hgb 11.6. No ESA indicated currently  4. HTN/volume-  Clonidine patch 0.2mg  on OP med list. Hasn't been tolerating PO meds.No volume overload on exam 5. MBD -  Continue Calcitriol/Auryxia binder/Sensipar 6. Recent dx colonic diverticulosis - no diverticulitis seen. Per primary   Lynnda Child PA-C [facility] Pager [phone] 10/31/2017,4:16 PM  LOS: 1 day   Additional Objective Labs: Basic Metabolic Panel: Recent Labs  Lab  10/27/17 0531 10/28/17 0418 10/31/17 0938  NA 134* 135 137  K 3.2* 3.0* 3.1*  CL 96* 96* 95*  CO2 17* 19* 20*  GLUCOSE 78 76 117*  BUN 63* 44* 73*  CREATININE 9.95* 7.57* 10.65*  CALCIUM 9.2 9.0 8.7*  PHOS 7.4* 5.8* 6.5*   CBC: Recent Labs  Lab 10/26/17 0608 10/27/17 0630 10/28/17 0418 10/31/17 0938  WBC 12.4* 11.0* 5.8 7.7  HGB 12.0 12.4 12.7 11.6*  HCT 36.9 37.9 38.9 36.2  MCV 98.1 96.2 97.7 96.5  PLT 215 228 199 198   Blood Culture    Component Value Date/Time   SDES BLOOD RIGHT WRIST 10/24/2017 0811   SPECREQUEST  10/24/2017 0811    BOTTLES DRAWN AEROBIC AND ANAEROBIC Blood Culture results may not be optimal due to an inadequate volume of blood received in culture bottles   CULT  10/24/2017 0811    NO GROWTH 5 DAYS Performed at [facility], [place] [address] [place], [place] [address]    REPTSTATUS 10/29/2017 FINAL 10/24/2017 0811    Cardiac Enzymes: Recent Labs  Lab 10/27/17 1048 10/27/17 1550  TROPONINI <0.03 <0.03   CBG: No results for input(s): GLUCAP in the last 168 hours. Iron Studies: No results for input(s): IRON, TIBC, TRANSFERRIN, FERRITIN in the last 72 hours. Lab Results  Component Value Date   INR 1.30 02/17/2017   INR 1.14 09/21/2016   INR 1.06 07/13/2012   Medications:  . amLODipine  5 mg Oral Daily  . amoxicillin-clavulanate  1 tablet  Oral Daily  . aspirin EC  81 mg Oral Daily  . calcitRIOL  0.25 mcg Oral Daily  . calcium acetate  1,334 mg Oral TID WC  . cinacalcet  60 mg Oral Daily  . cloNIDine  0.2 mg Transdermal Weekly  . ferric citrate  210 mg Oral Daily  . heparin  5,000 Units Subcutaneous Q8H

## 2017-10-31 NOTE — Progress Notes (Signed)
Date: 10/31/2017  Patient name: Nancy [name]  Medical record number: [id number]  Date of birth: Jul 11, 1940   I have seen and evaluated Nancy [name] and discussed their care with the Residency Team.  In brief, patient is a 77 year old female with a past medical history of end-stage renal disease on hemodialysis, aortic stenosis, hypertension who was recent admitted to the hospital from 10/24/2017 to 10/28/2017 for likely diverticulitis presented with intermittent confusion and missed hemodialysis session.  Patient was received 2 more days of oral Augmentin to complete her course of antibiotics for possible diverticulitis.  Patient was unable to pick up this medication as it was on back order at the pharmacy.  History obtained from chart as patient states that she is fine and wants to go home and refuses to provide further history. She missed her hemodialysis session on Wednesday stating that she did not feel like going.  She followed up in [facility] yesterday and family states that she has been intermittently confused at home and was insistent that she attended her hemodialysis session on Wednesday when she did not.  She denies any new complaints at this time the family notes that she had decreased oral intake since discharge.  Patient has refused vitals and labs since admission.  Denies any chest pain, no shortness of breath, no abdominal pain, no diarrhea, no lightheadedness, syncope, no focal weakness, no tingling or numbness, no fevers or chills.  Today patient states that she feels fine and that she will do dialysis at home but she refuses to get any vitals or take any medications currently.  Of note, patient has visual hallucinations and talks to someone lying on the floor even though no one is there.  She also states that Dr. Johny Chess examined her this morning even though he did so yesterday.  Patient also states that she does not remember being in [facility] but was brought to the hospital from some floor  upstairs.  PMHx, Fam Hx, and/or Soc Hx : As per resident admit note  Vitals:   General: Awake, alert, oriented x2 (not oriented to time), NAD CVS: Regular rate and rhythm, normal heart sounds Lungs: CTA bilaterally Abdomen: Patient refused Extremities: Patient refused examination  Assessment and Plan: I have seen and evaluated the patient as outlined above. I agree with the formulated Assessment and Plan as detailed in the residents' note, with the following changes:   1.  Altered mental status: -Patient was admitted from [facility] for intermittent confusion in the setting of a missed hemodialysis session.  Etiology of her AMS remains uncertain at this time.  Lab work from today showed an elevated BUN to the 70s but she does not appear to be uremic.  She has no leukocytosis and denies any fevers or chills making infection less likely.  I suspect that she may have some delirium related to her recent hospitalization and infection. - It is unclear if patient has capacity or not but given her visual hallucinations and lack of understanding of what would happen if she missed hemodialysis it appears less likely.  Will discuss with psychiatry and follow-up their recommendations. -Patient had an IVC done on admission as she had refused admission but appeared to be confused.  Will maintain sitter for now. -We will follow-up nephrology consult for possible hemodialysis today. -We will complete course of Augmentin for possible diverticulitis. -We will consider neuro eval if she continues to remain confused.  Patient refusing all tests at this time and it would be hard  to get imaging of her brain or get an EEG at this time.  Aldine Contes, MD 4/26/20191:52 PM

## 2017-10-31 NOTE — Telephone Encounter (Signed)
Pt in [facility] 4/25, IVC admission

## 2017-10-31 NOTE — Progress Notes (Signed)
Paged by nursing that patient was agitated and aggressive toward staff. Went to bedside to evaluate. Family members present at bedside. Security was present as well. The patient again was agitated and stating that she wanted to leave the hospital. She was standing near the door upon arrival. She is not oriented to person, place, or time and states that her name was Nancy [name] not Nancy [name]. She stated that she was up in the sky when asked where she was. She does not know what day it is. She does not remember leaving the hospital and being re-admitted, and states she has been here for four or more days. She repeatedly stated that she is not crazy. Her answers to my questions were intermittently appropriate, but she was very tangential. She denies pain and states she feels fine. Eventually, the patient calmed down somewhat and sat on the bed. She kept requesting that I contact the "bald" doctor who performed her arm surgery to prove that she is not crazy. The patient was agitated but did not display any aggression towards myself or staff, while I was present. Per family, sitter, and the patient's nurse she has tried hitting all of them. Staff concerned about fall risk because the patient refuses to wear non-slip booties and will not sit down.   Patient was evaluated by psychiatry today, who states the patient does not demonstrate capacity to refuse medical treatment including dialysis. Nancy [name] recommendation included Zyprexa 2.5 mg PRN for severe agitation.   A/P:  Unclear etiology of the patient's disorientation and agitation. She denies pain. She is afebrile, mildly tachycardic to 103 and hypertensive. Labs today significant for WBC 7.7, Hgb 11.6, BUN 73.  I would suspect missing HD would make the patient somnolent rather than hyperactive and agitated. Unlikely infectious etiology.  -I would like to avoid physical restraints, it would likely only worsen the patient's agitation.  -Given aggression towards  staff and high risk of falls will give the patient 1x dose of Zyprexa 2.5 mg for severe agitation.  -The patient does not have IV access and will likely refuse PO medications, will administer IM -Zyprexa IM 2.5 mg x 1 -Continue bedside sitter -will continue to monitor

## 2017-10-31 NOTE — Progress Notes (Signed)
Came into the patient's room,still sitting in the wheel chair that brought her in.She has been refusing for the care that she needs tobe here,like vital signs,changing her cloth into hospital gown.Refused her breakfast.

## 2017-10-31 NOTE — Progress Notes (Signed)
   Subjective: Nancy [name] reports that she would like to leave and she is upset with [place].  She is able to answer some questions well but does not remember missing dialysis or all the details of her hospitalization and discharge.    Objective:  Vital signs in last 24 hours: Vitals:   Physical Exam  Constitutional: She is oriented to person, place, and time. No distress.  Cardiovascular: Normal rate and regular rhythm. Exam reveals no gallop and no friction rub.  Murmur (3/6 systolic) heard. Pulmonary/Chest: Effort normal and breath sounds normal. No respiratory distress. She has no wheezes. She has no rales. She exhibits no tenderness.  Abdominal:  Pt would not allow [place] to assess  Musculoskeletal: Edema: would not allow [place] to assess.  Neurological: She is alert and oriented to person, place, and time.  Skin: She is not diaphoretic.  Psychiatric: Her affect is angry. She is actively hallucinating.  Pt reports seeing a little boy sitting on the floor    Assessment/Plan:  Active Problems:   Confusion  Encephalopathy: likely uremic, pt continuing to refuse dialysis, has not had a session in over one week, hallucinating, wanting to leave against medical advice.  Psychiatry consulted to determine capacity  -continue to try and persuade pt to receive dialysis -unsure if further workup would be valuable until toxins are removed by dialysis  ESRD: MWF dialysis last session completed on 17 April  -nephrology consulted appreciate assistance  Recent diverticulitis: pt with no abdominal complaints this morning, refusing to eat here but hard to blame on diverticulitis at this time as pt is refusing everything.      -continue to monitor  HTN: pt arrived with no clonidine patch, unsure if she was taking amlodipine at home  -will restart clonidine patch 0.3mg  weekly and amlodipine 10mg  daily   Dispo: Anticipated discharge in approximately 3-5 day(s).   Nancy Roan, MD 10/31/2017,  3:44 PM Nancy Muff MD PGY-1 Internal Medicine Pager # [phone]

## 2017-10-31 NOTE — Consult Note (Addendum)
[facility]iatry Consult   Reason for Consult:  Capacity evaluation Referring Physician:  Dr. Dareen Piano Patient Identification: Nancy [name] MRN:  [id number] Principal Diagnosis: Evaluation by psychiatric service required Diagnosis:   Patient Active Problem List   Diagnosis Date Noted  . Confusion [R41.0] 10/30/2017  . Intractable nausea and vomiting [R11.2] 10/24/2017  . Left arm pain [M79.602] 07/22/2017  . ESRD (end stage renal disease) ([employer]) [N18.6] 04/23/2017  . Secondary hyperparathyroidism of renal origin ([place]) [N25.81] 01/15/2017  . Mitral valve annular calcification [I05.9]   . Malnutrition of moderate degree [E44.0] 12/09/2016  . Constipation [K59.00] 10/15/2016  . Angina at rest [facility]) [I20.8]   . Goals of care, counseling/discussion [Z71.89]   . Metabolic acidosis [Q25.9] 05/22/2016  . Aortic stenosis [I35.0] 05/21/2016  . Anemia associated with chronic renal failure [N18.9, D63.1] 02/01/2016  . Atherosclerosis of aorta ([place]) [I70.0] 01/11/2015  . End stage renal disease ([place]) [N18.6] 02/04/2013  . Glaucoma [H40.9] 03/18/2012  . Health care maintenance [Z00.00] 09/17/2011  . Osteoarthrosis involving lower leg [M17.10] 10/31/2008  . History of CVA (cerebrovascular accident) [Z86.73] 01/28/2008  . Hyperlipidemia [E78.5] 02/13/2007  . PULMONARY NODULES [J98.4] 01/12/2007  . Left ventricular hypertrophy [I51.7] 09/02/2006  . Essential hypertension [I10] 08/11/2006  . GERD [K21.9] 08/11/2006  . Diverticulitis of colon [K57.32] 08/11/2006    Total Time spent with patient: 1 hour  Subjective:   Nancy [name] is a 77 y.o. female patient admitted with AMS in the setting of missing dialysis.  HPI:   Per chart review, patient was admitted with AMS in the setting of missing dialysis and possibly uremic encephalopathy.  She has been refusing all care including medications and was IVC'd yesterday. Primary team does not feel like she has capacity to discharge AMA.    On interview, Nancy [name] is very irritable and upset.  Her family is at bedside.  She will not answer any questions and frequently reports, "Ask my family."  Family reports that she is upset with them for bringing her to the hospital.  She agrees to speak to this notewriter once her family steps out of the room.  She is only oriented to self.  She reports that she is in [place] and the year is 2020.  She denies having any medical problems. She denies SI although she did make a statement earlier that she would kill herself because she was upset that she was in the hospital.  She denies HI or AVH.  She was noted to be responding to internal stimuli earlier.  She also endorsed VH to her primary team.  Her family reports that she has not been sleeping or eating for the past 3 days.  She has no prior psychiatric history.  They report that her mental status acutely declined after she missed dialysis.  Past Psychiatric History: Denies   Risk to Self:  None. Denies SI. Risk to Others:  None. Denies HI.  Prior Inpatient Therapy:  Denies  Prior Outpatient Therapy:  Denies   Past Medical History:  Past Medical History:  Diagnosis Date  . Anemia   . Aortic stenosis   . Bacterial sinusitis 09/17/2011  . CHF (congestive heart failure) ([place])   . CKD (chronic kidney disease) stage 4, GFR 15-29 ml/min ([place]) 08/11/2006   Cr continues to increase. Proteinuria on UA 02/10/12.    . Colitis   . CVA (cerebrovascular accident) [facility])    New hemorrhagic per CT scan '09  . Diverticulosis of colon   .  Dysfunctional uterine bleeding   . Fecal impaction ([place])   . Headache(784.0)   . Heart murmur   . HERNIORRHAPHY, HX OF 08/11/2006  . Hypertension   . OA (osteoarthritis)    bilateral knees  . Postmenopausal   . Pulmonary nodule   . TINEA CRURIS 01/12/2007    Past Surgical History:  Procedure Laterality Date  . ABDOMINAL HYSTERECTOMY    . AV FISTULA PLACEMENT Left 02/19/2017   Procedure: CREATION OF LEFT ARM  BRACHIOCEPHALIC ARTERIOVENOUS (AV) FISTULA;  Surgeon: Rosetta Posner, MD;  Location: [facility];  Service: Vascular;  Laterality: Left;  . BASCILIC VEIN TRANSPOSITION Left 04/23/2017   Procedure: BASILIC VEIN TRANSPOSITION SECOND STAGE;  Surgeon: Rosetta Posner, MD;  Location: [facility];  Service: Vascular;  Laterality: Left;  . CHOLECYSTECTOMY  2009  . COLONOSCOPY    . INGUINAL HERNIA REPAIR  2008  . INSERTION OF DIALYSIS CATHETER Right 02/19/2017   Procedure: INSERTION OF TUNNELED DIALYSIS CATHETER - RIGHT INTERNAL JUGULAR PLACEMENT;  Surgeon: Rosetta Posner, MD;  Location: [facility];  Service: Vascular;  Laterality: Right;  . IRIDOTOMY / IRIDECTOMY     Laser, right eye 12/26/11 left eye 01/24/12  . MASS EXCISION Left 05/07/2013   Procedure: EXCISION CYST;  Surgeon: Myrtha Mantis., MD;  Location: [facility];  Service: Ophthalmology;  Laterality: Left;   Family History:  Family History  Problem Relation Age of Onset  . Hypertension Mother   . Congestive Heart Failure Mother   . Heart attack Brother 36   Family Psychiatric  History: Denies  Social History:  Social History   Substance and Sexual Activity  Alcohol Use No  . Alcohol/week: 0.0 oz     Social History   Substance and Sexual Activity  Drug Use No   Comment: 08/15/08 UDS + cocaine    Social History   Socioeconomic History  . Marital status: Married    Spouse name: Not on file  . Number of children: Not on file  . Years of education: Not on file  . Highest education level: Not on file  Occupational History  . Not on file  Social Needs  . Financial resource strain: Not on file  . Food insecurity:    Worry: Not on file    Inability: Not on file  . Transportation needs:    Medical: Not on file    Non-medical: Not on file  Tobacco Use  . Smoking status: Never Smoker  . Smokeless tobacco: Never Used  Substance and Sexual Activity  . Alcohol use: No    Alcohol/week: 0.0 oz  . Drug use: No    Comment:  08/15/08 UDS + cocaine  . Sexual activity: Not Currently  Lifestyle  . Physical activity:    Days per week: Not on file    Minutes per session: Not on file  . Stress: Not on file  Relationships  . Social connections:    Talks on phone: Not on file    Gets together: Not on file    Attends religious service: Not on file    Active member of club or organization: Not on file    Attends meetings of clubs or organizations: Not on file    Relationship status: Not on file  Other Topics Concern  . Not on file  Social History Narrative   Married, lives with her husband. 1 child.          Additional Social History: She lives at home  alone.  At baseline she is independent of her ADLs.    Allergies:   Allergies  Allergen Reactions  . Hydrocodone-Acetaminophen Nausea And Vomiting and Other (See Comments)    Dizziness (also)    Labs:  Results for orders placed or performed during the hospital encounter of 10/30/17 (from the past 48 hour(s))  Renal function panel     Status: Abnormal   Collection Time: 10/31/17  9:38 AM  Result Value Ref Range   Sodium 137 135 - 145 mmol/L   Potassium 3.1 (L) 3.5 - 5.1 mmol/L   Chloride 95 (L) 101 - 111 mmol/L   CO2 20 (L) 22 - 32 mmol/L   Glucose, Bld 117 (H) 65 - 99 mg/dL   BUN 73 (H) 6 - 20 mg/dL   Creatinine, Ser 10.65 (H) 0.44 - 1.00 mg/dL   Calcium 8.7 (L) 8.9 - 10.3 mg/dL   Phosphorus 6.5 (H) 2.5 - 4.6 mg/dL   Albumin 3.2 (L) 3.5 - 5.0 g/dL   GFR calc non Af Amer 3 (L) >60 mL/min   GFR calc Af Amer 4 (L) >60 mL/min    Comment: (NOTE) The eGFR has been calculated using the CKD EPI equation. This calculation has not been validated in all clinical situations. eGFR's persistently <60 mL/min signify possible Chronic Kidney Disease.    Anion gap 22 (H) 5 - 15    Comment: Performed at [facility], [place] [address] [place], [place] [address]  CBC     Status: Abnormal   Collection Time: 10/31/17  9:38 AM  Result Value Ref Range   WBC 7.7  4.0 - 10.5 K/uL   RBC 3.75 (L) 3.87 - 5.11 MIL/uL   Hemoglobin 11.6 (L) 12.0 - 15.0 g/dL   HCT 36.2 36.0 - 46.0 %   MCV 96.5 78.0 - 100.0 fL   MCH 30.9 26.0 - 34.0 pg   MCHC 32.0 30.0 - 36.0 g/dL   RDW 16.6 (H) 11.5 - 15.5 %   Platelets 198 150 - 400 K/uL    Comment: Performed at [facility], [place] [address] [place], [place] [address]    Current Facility-Administered Medications  Medication Dose Route Frequency Provider Last Rate Last Dose  . amLODipine (NORVASC) tablet 5 mg  5 mg Oral Daily Shela Leff, MD      . amoxicillin-clavulanate (AUGMENTIN) 500-125 MG per tablet 500 mg  1 tablet Oral Daily Shela Leff, MD      . aspirin EC tablet 81 mg  81 mg Oral Daily Shela Leff, MD      . calcitRIOL (ROCALTROL) capsule 0.25 mcg  0.25 mcg Oral Daily Shela Leff, MD      . calcium acetate (PHOSLO) capsule 1,334 mg  1,334 mg Oral TID WC Shela Leff, MD      . cinacalcet (SENSIPAR) tablet 60 mg  60 mg Oral Daily Shela Leff, MD      . cloNIDine (CATAPRES - Dosed in mg/24 hr) patch 0.2 mg  0.2 mg Transdermal Weekly Shela Leff, MD      . ferric citrate (AURYXIA) tablet 210 mg  210 mg Oral Daily Shela Leff, MD      . heparin injection 5,000 Units  5,000 Units Subcutaneous Q8H Shela Leff, MD      . senna-docusate (Senokot-S) tablet 1 tablet  1 tablet Oral QHS PRN Shela Leff, MD        Musculoskeletal: Strength & Muscle Tone: within normal limits Gait & Station: UTA since  patient is sitting on a couch. Patient leans: N/A  Psychiatric Specialty Exam: Physical Exam  Nursing note and vitals reviewed. Constitutional: She appears well-developed and well-nourished.  HENT:  Head: Normocephalic and atraumatic.  Neck: Normal range of motion.  Respiratory: Effort normal.  Musculoskeletal: Normal range of motion.  Neurological: She is alert.  Oriented to person only.  Psychiatric: Her speech is normal. Thought content  normal. Her affect is labile. She is agitated. Cognition and memory are impaired. She expresses impulsivity.    Review of Systems  Musculoskeletal:       Arm pain at site of AV fistula.   Psychiatric/Behavioral: Negative for hallucinations and suicidal ideas. The patient has insomnia.   All other systems reviewed and are negative.   There were no vitals taken for this visit.There is no height or weight on file to calculate BMI.  General Appearance: Fairly Groomed, elderly, African American female wearing casual clothes with a wig and sitting on a couch. NAD.   Eye Contact:  Good  Speech:  Clear and Coherent and Normal Rate  Volume:  Increased  Mood:  Angry  Affect:  Labile  Thought Process:  Linear and Descriptions of Associations: Intact  Orientation:  Other:  Only oriented to person.  Thought Content:  Illogical  Suicidal Thoughts:  No  Homicidal Thoughts:  No  Memory:  Immediate;   Poor Recent;   Poor Remote;   Poor  Judgement:  Impaired  Insight:  Lacking  Psychomotor Activity:  Increased due to agitation.   Concentration:  Concentration: Poor and Attention Span: Poor  Recall:  Poor  Fund of Knowledge:  Poor  Language:  Fair  Akathisia:  No  Handed:  Right  AIMS (if indicated):   N/A  Assets:  Housing Social Support  ADL's:  Intact  Cognition:  Impaired due to AMS.  Sleep:   Poor   Assessment:  Nancy [name] is a 77 y.o. female who was admitted with AMS in the setting of missing dialysis. She does not demonstrate capacity to refuse medical treatment including dialysis. She is disoriented and does not appear to have an understanding of her current medical condition. She will need an authorized representative to help her make medical decisions. She has been responding to internal stimuli and agitated which is an acute change from her baseline mental status. She is likely presenting with delirium secondary to her medical condition.   Treatment Plan Summary: -Patient does  not demonstrate capacity to refuse medical treatment including dialysis. Fortunately she is agreeable at this time to receiving dialysis.  -Recommend Zyprexa 2.5 mg qhs for sleep/hallucinations and Zyprexa 2.5 mg daily PRN for severe agitation.  -Psychiatry will sign off on patient at this time. Please consult psychiatry again as needed.   Disposition: Patient does not meet criteria for psychiatric inpatient admission.  Faythe Dingwall, DO 10/31/2017 12:59 PM

## 2017-11-01 LAB — CBC
HCT: 34 % — ABNORMAL LOW (ref 36.0–46.0)
Hemoglobin: 10.9 g/dL — ABNORMAL LOW (ref 12.0–15.0)
MCH: 31.1 pg (ref 26.0–34.0)
MCHC: 32.1 g/dL (ref 30.0–36.0)
MCV: 96.9 fL (ref 78.0–100.0)
PLATELETS: 192 10*3/uL (ref 150–400)
RBC: 3.51 MIL/uL — ABNORMAL LOW (ref 3.87–5.11)
RDW: 16.9 % — AB (ref 11.5–15.5)
WBC: 8.3 10*3/uL (ref 4.0–10.5)

## 2017-11-01 LAB — RENAL FUNCTION PANEL
ALBUMIN: 2.8 g/dL — AB (ref 3.5–5.0)
Anion gap: 21 — ABNORMAL HIGH (ref 5–15)
BUN: 79 mg/dL — AB (ref 6–20)
CALCIUM: 8.5 mg/dL — AB (ref 8.9–10.3)
CO2: 18 mmol/L — ABNORMAL LOW (ref 22–32)
CREATININE: 11.73 mg/dL — AB (ref 0.44–1.00)
Chloride: 98 mmol/L — ABNORMAL LOW (ref 101–111)
GFR calc Af Amer: 3 mL/min — ABNORMAL LOW (ref >60)
GFR, EST NON AFRICAN AMERICAN: 3 mL/min — AB (ref >60)
GLUCOSE: 74 mg/dL (ref 65–99)
PHOSPHORUS: 7.3 mg/dL — AB (ref 2.5–4.6)
POTASSIUM: 3.4 mmol/L — AB (ref 3.5–5.1)
SODIUM: 137 mmol/L (ref 135–145)

## 2017-11-01 LAB — GLUCOSE, CAPILLARY: Glucose-Capillary: 82 mg/dL (ref 65–99)

## 2017-11-01 MED ORDER — CALCITRIOL 0.25 MCG PO CAPS
ORAL_CAPSULE | ORAL | Status: AC
  Filled 2017-11-01: qty 1

## 2017-11-01 MED ORDER — HEPARIN SODIUM (PORCINE) 1000 UNIT/ML DIALYSIS: 1000.0000 [IU] | INTRAMUSCULAR | Status: DC | PRN

## 2017-11-01 MED ORDER — SODIUM CHLORIDE 0.9 % IV SOLN: 100.0000 mL | INTRAVENOUS | Status: DC | PRN

## 2017-11-01 MED ORDER — HEPARIN SODIUM (PORCINE) 1000 UNIT/ML DIALYSIS
20.0000 [IU]/kg | INTRAMUSCULAR | Status: DC | PRN
  Administered 2017-11-01: 1300 [IU] via INTRAVENOUS_CENTRAL
  Filled 2017-11-01: qty 1.3

## 2017-11-01 MED ORDER — ALTEPLASE 2 MG IJ SOLR
2.0000 mg | Freq: Once | INTRAMUSCULAR | Status: DC | PRN
Start: 2017-11-01 — End: 2017-11-01

## 2017-11-01 MED ORDER — LIDOCAINE HCL (PF) 1 % IJ SOLN: 5.0000 mL | INTRAMUSCULAR | Status: DC | PRN

## 2017-11-01 MED ORDER — LIDOCAINE-PRILOCAINE 2.5-2.5 % EX CREA: 1.0000 "application " | TOPICAL_CREAM | CUTANEOUS | Status: DC | PRN

## 2017-11-01 MED ORDER — PENTAFLUOROPROP-TETRAFLUOROETH EX AERO: 1.0000 "application " | INHALATION_SPRAY | CUTANEOUS | Status: DC | PRN

## 2017-11-01 NOTE — Progress Notes (Signed)
   Subjective: Ms. Eppard was somnolent this am from the zyprexa, could not obtain a history, she was able to nearly complete her dialysis session this morning, it was stopped after 3 hours due to patient continually moving her arm.     Objective:  Vital signs in last 24 hours: Vitals:   11/01/17 0931 11/01/17 1000 11/01/17 1030 11/01/17 1100  BP: 139/68 (!) 145/66 (!) 143/62 (!) 152/68  Pulse: (!) 102 99 100 (!) 102  Resp: 14 15 16 17   Temp:    97.7 F (36.5 C)  TempSrc:    Oral  SpO2:    98%  Weight:    134 lb 0.6 oz (60.8 kg)   Physical Exam  Constitutional: No distress.  Cardiovascular: Normal rate and regular rhythm. Exam reveals no gallop and no friction rub.  Murmur (3/6 systolic) heard. Pulmonary/Chest: Effort normal and breath sounds normal. No respiratory distress. She has no wheezes. She has no rales. She exhibits no tenderness.  Abdominal: Soft. She exhibits no mass. There is no tenderness.  Pt would not allow [place] to assess  Musculoskeletal: She exhibits no edema (would not allow [place] to assess).  Skin: She is not diaphoretic.    Assessment/Plan:  Principal Problem:   Evaluation by psychiatric service required Active Problems:   Confusion  Encephalopathy: likely uremic, agitated, bilateral hand tremors, pt  refusing dialysis, has not had a session in over one week, hallucinating, wanting to leave against medical advice.  Psychiatry consulted, determined pt does not have capacity.  More agitated overnight pt given zyprexa IM  -pt received 3 hours of dialysis today will reassess when zyprexa wears off -unsure if further workup would be valuable until toxins are removed by dialysis  ESRD: MWF dialysis last session completed on 17 April  -nephrology consulted appreciate assistance  Recent diverticulitis: pt with no abdominal complaints on this admission, refusing to eat here but hard to blame on diverticulitis at this time as pt is refusing everything.      -continue  to monitor, more assessment when mental status stable  HTN: pt arrived with no clonidine patch, unsure if she was taking amlodipine at home  -restarted clonidine patch 0.3mg  weekly and amlodipine 10mg  daily   Dispo: Anticipated discharge in approximately 3-5 day(s).   Katherine Roan, MD 11/01/2017, 12:15 PM Vickki Muff MD PGY-1 Internal Medicine Pager # [phone]

## 2017-11-01 NOTE — Procedures (Signed)
She is resting on dialysis. She received IM Zyprexa and is resting now. No hemodynamic instability. LUE AV ok, K 3.1, use 4K bath BFR 350cc/min. Estanislado Emms, MD

## 2017-11-01 NOTE — Discharge Summary (Addendum)
Name: Nancy [name] MRN: [id number] DOB: 04/29/1941 77 y.o. PCP: Aldine Contes, MD  Date of Admission: 10/30/2017  6:50 PM Date of Discharge: 11/04/17 Attending Physician: Aldine Contes, MD  Discharge Diagnosis:  Principal Problem:   Evaluation by psychiatric service required Active Problems:   Confusion   Encephalopathy   Effusion, right knee   Discharge Medications: Allergies as of 11/04/2017      Reactions   Hydrocodone-acetaminophen Nausea And Vomiting, Other (See Comments)   Dizziness (also)      Medication List    STOP taking these medications   amoxicillin-clavulanate 400-57 MG chewable tablet Commonly known as:  AUGMENTIN     TAKE these medications   acetaminophen 500 MG tablet Commonly known as:  TYLENOL Take 2 tablets (1,000 mg total) by mouth every 8 (eight) hours as needed for mild pain, moderate pain, fever or headache.   amLODipine 10 MG tablet Commonly known as:  NORVASC Take 1/2 tablet by mouth according to prior schedule   aspirin 81 MG EC tablet Take 1 tablet (81 mg total) by mouth daily.   AURYXIA 1 GM 210 MG(Fe) tablet Generic drug:  ferric citrate Take 210 mg by mouth daily.   calcitRIOL 0.25 MCG capsule Commonly known as:  ROCALTROL Take 1 capsule (0.25 mcg total) by mouth daily.   calcium acetate 667 MG capsule Commonly known as:  PHOSLO Take 2 capsules (1,334 mg total) by mouth 3 (three) times daily with meals.   cloNIDine 0.2 mg/24hr patch Commonly known as:  CATAPRES - Dosed in mg/24 hr Place 1 patch (0.2 mg total) onto the skin every 7 (seven) days.   nitroGLYCERIN 0.3 MG SL tablet Commonly known as:  NITROSTAT Place 1 tablet (0.3 mg total) under the tongue every 5 (five) minutes as needed for chest pain.   polyethylene glycol packet Commonly known as:  MIRALAX / GLYCOLAX Take 17 g by mouth daily.   promethazine 12.5 MG tablet Commonly known as:  PHENERGAN Take 1 tablet (12.5 mg total) by mouth every 6 (six)  hours as needed for up to 2 days for nausea or vomiting.   senna-docusate 8.6-50 MG tablet Commonly known as:  Senokot-S Take 1 tablet by mouth at bedtime as needed for mild constipation or moderate constipation.   SENSIPAR 60 MG tablet Generic drug:  cinacalcet Take 1 tablet (60 mg total) by mouth daily.       Disposition and follow-up:   Nancy [name] was discharged from [facility] in stable condition.  At the hospital follow up visit please address:  Uremic encephalopathy  -ensure pt going to dialysis appropriately  Post dialysis nausea/vomiting  -ensure pt taking stool softener -ensure pt talking with nephrologist about ways to prevent hypotension   2.  Labs / imaging needed at time of follow-up: none  3.  Pending labs/ test needing follow-up: none  Follow-up Appointments: Follow-up Information    [facility].   Specialty:  Home Health Services Why:  Home Health RN, Physical Therapy, Social Worker agency will call to arrange initial appointment Contact information: [address] [place] [address] [phone]        Aldine Contes, MD Follow up in 1 week(s).   Specialty:  Internal Medicine Why:  Please folow up in our clinic in one week Contact information: [place]  [id number] [place] [facility] Course by problem list:  Principal Problem:   Evaluation by psychiatric service required Active Problems:   Confusion   Encephalopathy   Effusion, right knee   Patient was admitted for encephalopathy in the setting of missing several dialysis sessions.  She was brought to our clinic due to the family's concerns with her increased confusion.  It was felt that the patient did not have capacity and was involuntarily committed.  On day 1 of admission she was found to be hallucinating, she had bilateral hand tremors and it was felt that she was  experiencing uremic encephalopathy.  Psychiatry was consulted and evaluated the patient and determined that she did not have capacity and recommended PRN Zyprexa.  The patient began to get agitated that following evening and was aggressive with staff, she received a one-time dose of Zyprexa with good result.  During her somnolence she was also administered a dialysis session which she received 3 hours worth of dialysis.  The following morning her confusion had improved somewhat.  After several attempts she was talked into getting another dialysis session.  After the second session she was rapidly returning to her baseline.  At that time she denied missing any dialysis sessions and was supportive of going to dialysis.  The patient was seemingly back to her baseline was felt that she had capacity and now that she was willing to continue with her dialysis the decision was made to discharge her home as she was not interested in skilled nursing facility.  She was discharged to follow-up with her regular dialysis schedule outpatient.  Discharge Vitals:   BP (!) 142/65   Pulse 98   Temp 98.1 F (36.7 C) (Oral)   Resp 18   Ht 5\' 2"  (1.575 m)   Wt 128 lb 8.5 oz (58.3 kg)   SpO2 100%   BMI 23.51 kg/m   Pertinent Labs, Studies, and Procedures:  CBC Latest Ref Rng & Units 11/03/2017 11/02/2017 11/01/2017  WBC 4.0 - 10.5 K/uL 6.5 7.1 8.3  Hemoglobin 12.0 - 15.0 g/dL 10.7(L) 11.5(L) 10.9(L)  Hematocrit 36.0 - 46.0 % 34.0(L) 36.2 34.0(L)  Platelets 150 - 400 K/uL 215 190 192   BMP Latest Ref Rng & Units 11/03/2017 11/02/2017 11/01/2017  Glucose 65 - 99 mg/dL 92 88 74  BUN 6 - 20 mg/dL 39(H) 32(H) 79(H)  Creatinine 0.44 - 1.00 mg/dL 8.07(H) 6.85(H) 11.73(H)  BUN/Creat Ratio 12 - 28 - - -  Sodium 135 - 145 mmol/L 137 136 137  Potassium 3.5 - 5.1 mmol/L 3.8 3.9 3.4(L)  Chloride 101 - 111 mmol/L 98(L) 98(L) 98(L)  CO2 22 - 32 mmol/L 24 21(L) 18(L)  Calcium 8.9 - 10.3 mg/dL 8.8(L) 8.7(L) 8.5(L)    Discharge  Instructions: Discharge Instructions    Call MD for:  difficulty breathing, headache or visual disturbances   Complete by:  As directed    Call MD for:  hives   Complete by:  As directed    Call MD for:  persistant dizziness or light-headedness   Complete by:  As directed    Call MD for:  persistant nausea and vomiting   Complete by:  As directed    Call MD for:  redness, tenderness, or signs of infection (pain, swelling, redness, odor or green/yellow discharge around incision site)   Complete by:  As directed    Call MD for:  severe uncontrolled pain   Complete by:  As directed    Call MD for:  temperature >100.4   Complete by:  As  directed    Diet - low sodium heart healthy   Complete by:  As directed    Discharge instructions   Complete by:  As directed    Nancy [name], you are doing better.  It will be important to go to dialysis in the future, even on days you may not be feeling so well.  You have your regularly scheduled dialysis session tomorrow, it will be important for you to make it there.  There is some nausea medicine available at your pharmacy for you.  I have cancelled the antibiotics and you no longer need them as your diverticulitis has resolved.  Please keep your follow up appointment in our clinic as well to further evaluate how you're doing.   Face-to-face encounter (required for Medicare/Medicaid patients)   Complete by:  As directed    I Vickki Muff certify that this patient is under my care and that I, or a nurse practitioner or physician's assistant working with me, had a face-to-face encounter that meets the physician face-to-face encounter requirements with this patient on 11/04/2017. The encounter with the patient was in whole, or in part for the following medical condition(s) which is the primary reason for home health care Uremic Encephalopathy, ESRD, Pt will benefit from social work, she continues to refuse SNF care but has consistently demonstrated poor  understanding of illness and continues to miss dialysis sessions.   The encounter with the patient was in whole, or in part, for the following medical condition, which is the primary reason for home health care:  Uremic encephalopathy   I certify that, based on my findings, the following services are medically necessary home health services:   Nursing Physical therapy     Reason for Medically Necessary Home Health Services:   Therapy- Personnel officer, Public librarian Therapy- Instruction use of Assistive Devices for Safe Swallowing     My clinical findings support the need for the above services:  Cognitive impairments, dementia, or mental confusion  that make it unsafe to leave home   Further, I certify that my clinical findings support that this patient is homebound due to:  Shortness of Breath with activity   Home Health   Complete by:  As directed    To provide the following care/treatments:   PT RN [place] work     Increase activity slowly   Complete by:  As directed       Signed: Katherine Roan, MD 11/04/2017, 1:50 PM

## 2017-11-01 NOTE — Progress Notes (Addendum)
Patient's daughter approached me at the nursing station,stated that she is the POA,she was requesting to wheeled  her mother out the unit by herself just for a change of enviroment.Nurse explained since she is IVC , its not allowed.She told me that her mother agreed to have her HD treatment,and if she could stay at her side eventually would calm her down.Nurse replied that family members always welcome at patient's side.She asked again,if her mother agrees now to go to hemodialysis,could she go with her?,to which the nurse replied,'' I would not be able to answer that becouse they have different policies from [place] and besides she doesn't have any order yet for HD,we're still waiting for the renal M.D. And if ever we have an order for HD,she needs to wait for hours to have her HD since it's mid-afternoon now.At this point she seems irritated/upset for the slow moving plan of care for the patient.She requested me to with her into patient's room so that I could hear her and mother that the patient agreed to have her H.D. Treatment,to which I replied I follow her after I closed my computer.Iwent into patient's room,halfway into the door,i heard her mother still refusing to have her HD treatment.My other patient's wife was by my side was telling me ,her spouse was having SOB,CCMD called me for heart rate 150beats/min for him.Went immediately to room 11 and assessed my patient in that room and went back to nurse station.At this moment ,patient;s daugter came back to me and she said "I asked you to come with me and you did not" to which I replied''i followed you and by the doorway I heard what your mother was saying and she still refusing".She requested me to go with her back into her room so that I could hear her mother that she's agreeing with her HD.We went back into patient's room,once inside,she asked again her mother, her mother still refusing.Patient's daughter asked me again"if she agrees, how fast she could be to  HD.We don't have any HD order yet as of this moment,and if we do have,i don't know what time,she going to be in HD.At this moment ,she lost her temper,shouted back at me and told me'' Why are you so combative on me? ''You are dirty ,look at you !!you are sick'.what is in yor nose? You are not capable of taking care of my mother" 'I want another nurse''.Nurse replied ,I that's my topical pimple medicine,i' am honest for all your inquiries and you will get another nurse. I went out from the room and called Charge Nurse made aware of the situation,that i was terminated as their caregiver..Patient's daughter went to station and still talking bad about me.After 20 minutes,unit's secretary told me ,that patient's daughter was calling me back into patient's room,i told the unit's secretary,not anymore,Charge nurse was aware of situation and besides I am with patient.

## 2017-11-02 ENCOUNTER — Encounter (HOSPITAL_COMMUNITY): Payer: Self-pay

## 2017-11-02 DIAGNOSIS — R451 Restlessness and agitation: Secondary | ICD-10-CM

## 2017-11-02 DIAGNOSIS — F5089 Other specified eating disorder: Secondary | ICD-10-CM

## 2017-11-02 DIAGNOSIS — G9340 Encephalopathy, unspecified: Secondary | ICD-10-CM

## 2017-11-02 DIAGNOSIS — Z9115 Patient's noncompliance with renal dialysis: Secondary | ICD-10-CM

## 2017-11-02 LAB — RENAL FUNCTION PANEL
ALBUMIN: 2.8 g/dL — AB (ref 3.5–5.0)
ANION GAP: 17 — AB (ref 5–15)
BUN: 32 mg/dL — ABNORMAL HIGH (ref 6–20)
CO2: 21 mmol/L — ABNORMAL LOW (ref 22–32)
Calcium: 8.7 mg/dL — ABNORMAL LOW (ref 8.9–10.3)
Chloride: 98 mmol/L — ABNORMAL LOW (ref 101–111)
Creatinine, Ser: 6.85 mg/dL — ABNORMAL HIGH (ref 0.44–1.00)
GFR calc Af Amer: 6 mL/min — ABNORMAL LOW (ref >60)
GFR calc non Af Amer: 5 mL/min — ABNORMAL LOW (ref >60)
Glucose, Bld: 88 mg/dL (ref 65–99)
PHOSPHORUS: 5.9 mg/dL — AB (ref 2.5–4.6)
POTASSIUM: 3.9 mmol/L (ref 3.5–5.1)
Sodium: 136 mmol/L (ref 135–145)

## 2017-11-02 LAB — CBC
HEMATOCRIT: 36.2 % (ref 36.0–46.0)
Hemoglobin: 11.5 g/dL — ABNORMAL LOW (ref 12.0–15.0)
MCH: 31.5 pg (ref 26.0–34.0)
MCHC: 31.8 g/dL (ref 30.0–36.0)
MCV: 99.2 fL (ref 78.0–100.0)
Platelets: 190 10*3/uL (ref 150–400)
RBC: 3.65 MIL/uL — ABNORMAL LOW (ref 3.87–5.11)
RDW: 16.9 % — ABNORMAL HIGH (ref 11.5–15.5)
WBC: 7.1 10*3/uL (ref 4.0–10.5)

## 2017-11-02 NOTE — Progress Notes (Signed)
   Subjective:   Nancy [name] is upset with [place] this morning.  She continues to ask to go home, she refused dialysis today.  She states that she doesn't need dialysis and that if she doesn't get it nothing would happen to her because she was fine before she had dialysis and she will be fine without it.  She denies any diarrhea, nausea or vomiting.    Objective:  Vital signs in last 24 hours: Vitals:   11/01/17 1030 11/01/17 1100 11/02/17 0500 11/02/17 0725  BP: (!) 143/62 (!) 152/68 (!) 154/67 (!) 157/64  Pulse: 100 (!) 102 (!) 110 (!) 105  Resp: 16 17 18 18   Temp:  97.7 F (36.5 C) 98.5 F (36.9 C) (!) 97.5 F (36.4 C)  TempSrc:  Oral Oral Oral  SpO2:  98% 100% 100%  Weight:  134 lb 0.6 oz (60.8 kg)     Physical Exam  Constitutional: No distress.  Cardiovascular: Normal rate and regular rhythm. Exam reveals no gallop and no friction rub.  Murmur (3/6 systolic) heard. Pulmonary/Chest: Effort normal and breath sounds normal. No respiratory distress. She has no wheezes. She has no rales. She exhibits no tenderness.  Abdominal: Soft. She exhibits no mass. There is no tenderness.  Musculoskeletal: She exhibits no edema (would not allow [place] to assess).  Skin: She is not diaphoretic.  Psychiatric: She is agitated. She is not actively hallucinating.  Pt oriented to person, place, time.  Can tell me similarities between an apple and orange.  Added change incorrectly.  Mental status with improvement from day prior.      Assessment/Plan:  Principal Problem:   Evaluation by psychiatric service required Active Problems:   Confusion  Encephalopathy: likely uremic, agitated, bilateral hand tremors, pt  refusing dialysis, has not had a session in over one week, hallucinating, wanting to leave against medical advice.  Psychiatry consulted, determined pt does not have capacity.  Required one time dose of zyprexa so far.  -improved with dialysis, still not back to baseline -will use PRN zyprexa  2.5mg  for agitation  ESRD: MWF dialysis last session completed on 17 April  -nephrology consulted appreciate assistance  Recent diverticulitis: pt with no abdominal complaints on this admission, pt still refusing to eat  -denying pain this am, no diarrhea, will continue to monitor  HTN: pt arrived with no clonidine patch, unsure if she was taking amlodipine at home  -restarted clonidine patch 0.3mg  weekly and amlodipine 10mg  daily   Dispo: Anticipated discharge in approximately 3-5 day(s).   Nancy Roan, MD 11/02/2017, 8:22 AM Vickki Muff MD PGY-1 Internal Medicine Pager # [phone]

## 2017-11-02 NOTE — Progress Notes (Signed)
Assessment/Plan: 1. AMS/Confusion - Improved 2. ESRD - MWF. HD Monday  4. HTN/volume- ok 5. MBD -  Continue Calcitriol/Auryxia binder/Sensipar 6. Recent dx colonic diverticulosis - no diverticulitis    Subjective: Interval History: Tol HD yesterday 1100cc off  Objective: Vital signs in last 24 hours: Temp:  [97.5 F (36.4 C)-98.5 F (36.9 C)] 98.3 F (36.8 C) (04/28 1022) Pulse Rate:  [105-110] 109 (04/28 1022) Resp:  [18-20] 20 (04/28 1022) BP: (154-157)/(64-69) 155/69 (04/28 1022) SpO2:  [100 %] 100 % (04/28 1022) Weight change:   Intake/Output from previous day: 04/27 0701 - 04/28 0700 In: 360 [P.O.:360] Out: 1128  Intake/Output this shift: No intake/output data recorded.  General appearance: alert and cooperative Chest wall: no tenderness Cardio: regular rate and rhythm, S1, S2 normal, no murmur, click, rub or gallop GI: soft, non-tender; bowel sounds normal; no masses,  no organomegaly  Lab Results: Recent Labs    11/01/17 0802 11/02/17 0925  WBC 8.3 7.1  HGB 10.9* 11.5*  HCT 34.0* 36.2  PLT 192 190   BMET:  Recent Labs    11/01/17 0802 11/02/17 0925  NA 137 136  K 3.4* 3.9  CL 98* 98*  CO2 18* 21*  GLUCOSE 74 88  BUN 79* 32*  CREATININE 11.73* 6.85*  CALCIUM 8.5* 8.7*   No results for input(s): PTH in the last 72 hours. Iron Studies: No results for input(s): IRON, TIBC, TRANSFERRIN, FERRITIN in the last 72 hours. Studies/Results: No results found.  Prior to Admission:  Medications Prior to Admission  Medication Sig Dispense Refill Last Dose  . cloNIDine (CATAPRES - DOSED IN MG/24 HR) 0.2 mg/24hr patch Place 1 patch (0.2 mg total) onto the skin every 7 (seven) days. 12 patch 3 LF 09-04-17 at 90DS  . polyethylene glycol (MIRALAX / GLYCOLAX) packet Take 17 g by mouth daily. 30 each 0 LF 10-29-17 at 30DS  . senna-docusate (SENOKOT-S) 8.6-50 MG tablet Take 1 tablet by mouth at bedtime as needed for mild constipation or moderate constipation. 30  tablet 0 LF 10-29-17 at 30DS  . acetaminophen (TYLENOL) 500 MG tablet Take 2 tablets (1,000 mg total) by mouth every 8 (eight) hours as needed for mild pain, moderate pain, fever or headache. 30 tablet 0  at [facility]  . amLODipine (NORVASC) 10 MG tablet Take 1/2 tablet by mouth according to prior schedule 30 tablet 5 LF 09-03-17 at 30DS  . [EXPIRED] amoxicillin-clavulanate (AUGMENTIN) 400-57 MG chewable tablet Chew 1 tablet by mouth daily for 2 days. 2 tablet 0   . aspirin EC 81 MG EC tablet Take 1 tablet (81 mg total) by mouth daily. 30 tablet 1  at [facility]  . calcitRIOL (ROCALTROL) 0.25 MCG capsule Take 1 capsule (0.25 mcg total) by mouth daily. 90 capsule 0  at [facility]  . calcium acetate (PHOSLO) 667 MG capsule Take 2 capsules (1,334 mg total) by mouth 3 (three) times daily with meals. 90 capsule 1 LF 07-23-17 at 30DS  . ferric citrate (AURYXIA) 1 GM 210 MG(Fe) tablet Take 210 mg by mouth daily.    at [facility]  . nitroGLYCERIN (NITROSTAT) 0.3 MG SL tablet Place 1 tablet (0.3 mg total) under the tongue every 5 (five) minutes as needed for chest pain. 30 tablet 0  at [facility]  . promethazine (PHENERGAN) 12.5 MG tablet Take 1 tablet (12.5 mg total) by mouth every 6 (six) hours as needed for up to 2 days for nausea or vomiting. 8 tablet 0   . SENSIPAR 60 MG  tablet Take 1 tablet (60 mg total) by mouth daily. 30 tablet 6  at [facility]   Scheduled: . amLODipine  5 mg Oral Daily  . aspirin EC  81 mg Oral Daily  . calcitRIOL  0.25 mcg Oral Daily  . calcium acetate  1,334 mg Oral TID WC  . cinacalcet  60 mg Oral Daily  . cloNIDine  0.2 mg Transdermal Weekly  . ferric citrate  210 mg Oral Daily  . heparin  5,000 Units Subcutaneous Q8H      LOS: 3 days   Estanislado Emms 11/02/2017,11:43 AM

## 2017-11-02 NOTE — Progress Notes (Signed)
  Date: 11/02/2017  Patient name: Nancy [name]  Medical record number: [id number]  Date of birth: Jun 15, 1941   I have seen and evaluated this patient and I have discussed the plan of care with the house staff. Please see their note for complete details. I concur with their findings with the following additions/corrections: Nancy [name] was seen on AM rounds with Drs Magdalene River and Clay. She is A&O to person, place, and time. She exhibits no insight into her dz as she states she would have no poor outcome if she stopped HD. Dr Shan Levans notices improvement after the HD session on 4/27 but per my assessment, pt still lacks capacity to make her own medical decisions. Renal planning HD 4/29 - will see how that goes.  Bartholomew Crews, MD 11/02/2017, 2:58 PM

## 2017-11-03 DIAGNOSIS — R251 Tremor, unspecified: Secondary | ICD-10-CM

## 2017-11-03 LAB — RENAL FUNCTION PANEL
Albumin: 2.8 g/dL — ABNORMAL LOW (ref 3.5–5.0)
Anion gap: 15 (ref 5–15)
BUN: 39 mg/dL — AB (ref 6–20)
CHLORIDE: 98 mmol/L — AB (ref 101–111)
CO2: 24 mmol/L (ref 22–32)
Calcium: 8.8 mg/dL — ABNORMAL LOW (ref 8.9–10.3)
Creatinine, Ser: 8.07 mg/dL — ABNORMAL HIGH (ref 0.44–1.00)
GFR calc Af Amer: 5 mL/min — ABNORMAL LOW (ref >60)
GFR calc non Af Amer: 4 mL/min — ABNORMAL LOW (ref >60)
GLUCOSE: 92 mg/dL (ref 65–99)
POTASSIUM: 3.8 mmol/L (ref 3.5–5.1)
Phosphorus: 5.3 mg/dL — ABNORMAL HIGH (ref 2.5–4.6)
Sodium: 137 mmol/L (ref 135–145)

## 2017-11-03 LAB — CBC
HEMATOCRIT: 34 % — AB (ref 36.0–46.0)
Hemoglobin: 10.7 g/dL — ABNORMAL LOW (ref 12.0–15.0)
MCH: 30.7 pg (ref 26.0–34.0)
MCHC: 31.5 g/dL (ref 30.0–36.0)
MCV: 97.7 fL (ref 78.0–100.0)
Platelets: 215 10*3/uL (ref 150–400)
RBC: 3.48 MIL/uL — ABNORMAL LOW (ref 3.87–5.11)
RDW: 16.7 % — AB (ref 11.5–15.5)
WBC: 6.5 10*3/uL (ref 4.0–10.5)

## 2017-11-03 MED ORDER — SODIUM CHLORIDE 0.9 % IV SOLN: 100.0000 mL | INTRAVENOUS | Status: DC | PRN

## 2017-11-03 MED ORDER — PENTAFLUOROPROP-TETRAFLUOROETH EX AERO: 1.0000 "application " | INHALATION_SPRAY | CUTANEOUS | Status: DC | PRN

## 2017-11-03 MED ORDER — LIDOCAINE HCL (PF) 1 % IJ SOLN: 5.0000 mL | INTRAMUSCULAR | Status: DC | PRN

## 2017-11-03 MED ORDER — LIDOCAINE-PRILOCAINE 2.5-2.5 % EX CREA: 1.0000 "application " | TOPICAL_CREAM | CUTANEOUS | Status: DC | PRN

## 2017-11-03 MED ORDER — HEPARIN SODIUM (PORCINE) 1000 UNIT/ML DIALYSIS: 1000.0000 [IU] | INTRAMUSCULAR | Status: DC | PRN

## 2017-11-03 MED ORDER — ACETAMINOPHEN 325 MG PO TABS
650.0000 mg | ORAL_TABLET | Freq: Four times a day (QID) | ORAL | Status: DC | PRN
  Administered 2017-11-03 – 2017-11-04 (×2): 650 mg via ORAL
  Filled 2017-11-03 (×2): qty 2

## 2017-11-03 MED ORDER — ALTEPLASE 2 MG IJ SOLR: 2.0000 mg | Freq: Once | INTRAMUSCULAR | Status: DC | PRN

## 2017-11-03 MED ORDER — HEPARIN SODIUM (PORCINE) 1000 UNIT/ML DIALYSIS: 20.0000 [IU]/kg | INTRAMUSCULAR | Status: DC | PRN

## 2017-11-03 NOTE — Progress Notes (Signed)
Internal Medicine Attending:   I saw and examined the patient. I reviewed the resident's note and I agree with the resident's findings and plan as documented in the resident's note.  Patient feels better today.  She is awake alert and oriented x3 and is cooperative with medical management.  She opted to go to hemodialysis today.  She still states that she does not know what happened she discontinued hemodialysis but hopes that she would be okay.  Patient was initially admitted with worsening encephalopathy likely secondary to uremia given her hallucinations and bilateral hand tremors and elevated BUN.  Psych consult and recommendations appreciated.  Patient does not have capacity to make her own medical decisions at this time.  She is improving slowly with hemodialysis and has not had any hallucinations in 2 days.  We will continue with HD per nephrology.  Patient still with poor oral intake but states that she is hungry today.  I suspect that her loss of appetite may be secondary to uremia as well.  Will follow up today and see if she is able to tolerate oral intake.

## 2017-11-03 NOTE — Progress Notes (Signed)
[facility] Progress Note  Subjective: no c/o, o x 3 , on hd  Vitals:   11/03/17 1101 11/03/17 1115 11/03/17 1130 11/03/17 1200  BP: (!) 146/67 (!) 134/59 (!) 148/63 (!) 155/70  Pulse: (!) 102 (!) 101 100 (!) 101  Resp:      Temp:      TempSrc:      SpO2:      Weight:        Inpatient medications: . amLODipine  5 mg Oral Daily  . aspirin EC  81 mg Oral Daily  . calcitRIOL  0.25 mcg Oral Daily  . calcium acetate  1,334 mg Oral TID WC  . cinacalcet  60 mg Oral Daily  . cloNIDine  0.2 mg Transdermal Weekly  . ferric citrate  210 mg Oral Daily  . heparin  5,000 Units Subcutaneous Q8H   . sodium chloride    . sodium chloride     sodium chloride, sodium chloride, alteplase, heparin, heparin, lidocaine (PF), lidocaine-prilocaine, pentafluoroprop-tetrafluoroeth, senna-docusate  Exam: Elderly aaf looks younger than stated age No distress n ojvd Chest clear bilat Cor reg no mrg Abd soft ntnd Ext no leg edema  Left arm avf w bruit   Dialysis: [facility] 4h  60kg   300/800  2/2.5 bath left arm avf  Heparin 3000 Venofer 100mg  IV x10 until 4/24 Mircera 15mcg IV q 2weeks dosed 4/10 Calcitriol 2.0mg  PO TIW Auryxia 1 tab TID qac  Sensipar 60mg  PO bid   Impression: 1  Ams/ hallucinations improving w dialysis 2  esrd hd mwf 3  Htn/ vol stable 4  mbd ckd cont calc/ auryxia/ sensipar 5  Anemia ckd due for esa but Hb >10 so will hold  Plan - hemo today, dispo per primary   Kelly Splinter MD [facility] pager [phone]   11/03/2017, 12:15 PM   Recent Labs  Lab 11/01/17 0802 11/02/17 0925 11/03/17 0814  NA 137 136 137  K 3.4* 3.9 3.8  CL 98* 98* 98*  CO2 18* 21* 24  GLUCOSE 74 88 92  BUN 79* 32* 39*  CREATININE 11.73* 6.85* 8.07*  CALCIUM 8.5* 8.7* 8.8*  PHOS 7.3* 5.9* 5.3*   Recent Labs  Lab 11/01/17 0802 11/02/17 0925 11/03/17 0814  ALBUMIN 2.8* 2.8* 2.8*   Recent Labs  Lab 11/01/17 0802 11/02/17 0925 11/03/17 0814  WBC  8.3 7.1 6.5  HGB 10.9* 11.5* 10.7*  HCT 34.0* 36.2 34.0*  MCV 96.9 99.2 97.7  PLT 192 190 215   Iron/TIBC/Ferritin/ %Sat    Component Value Date/Time   IRON 33 10/15/2016 0847   TIBC 202 (L) 10/15/2016 0847   FERRITIN 127 10/15/2016 0847   IRONPCTSAT 16 10/15/2016 0847   IRONPCTSAT 12 (L) 02/09/2014 4166

## 2017-11-03 NOTE — Progress Notes (Signed)
Pt with complaints of back discomfort and requesting tylenol. Page to Dr. Johny Chess for notification. Dorthey Sawyer, RN

## 2017-11-03 NOTE — Progress Notes (Addendum)
   Subjective:   Nancy [name] is in a more pleasant mood this morning.  She continues to ask to go home, however she chose to go to dialysis this morning and stated that she always goes as she is supposed to.  She was asked again today what would happen if she discontinued dialysis and she said she hopes she would be alright but doesn't know.  She denies any diarrhea, nausea or vomiting.    Objective:  Vital signs in last 24 hours: Vitals:   11/03/17 0830 11/03/17 0900 11/03/17 0915 11/03/17 0930  BP: (!) 164/72 127/63 122/62 119/68  Pulse: 100 97 98 98  Resp:      Temp:      TempSrc:      SpO2:      Weight:       Physical Exam  Constitutional: No distress.  Cardiovascular: Normal rate and regular rhythm. Exam reveals no gallop and no friction rub.  Murmur (3/6 systolic) heard. Pulmonary/Chest: Effort normal and breath sounds normal. No respiratory distress. She has no wheezes. She has no rales. She exhibits no tenderness.  Abdominal: Soft. She exhibits no mass. There is no tenderness.  Musculoskeletal: She exhibits no edema (would not allow [place] to assess).  Skin: She is not diaphoretic.  Psychiatric: She is not agitated, not slowed and not actively hallucinating.  Pt oriented to person, place, time.  Mental status with improvement from day prior.      Assessment/Plan:  Principal Problem:   Evaluation by psychiatric service required Active Problems:   Confusion   Encephalopathy  Encephalopathy: likely uremic, agitated, bilateral hand tremors, pt  refusing dialysis, has not had a session in over one week, hallucinating, wanting to leave against medical advice.  Psychiatry consulted, determined pt does not have capacity.  Required one time dose of zyprexa so far.  -improving with dialysis, day 2 of no hallucinations  -will use PRN zyprexa 2.5mg  for agitation  ESRD: MWF dialysis last session completed on 17 April  -nephrology consulted appreciate assistance -one 3 hour session  complete on Saturday and getting dialysis today  Recent diverticulitis: pt with no abdominal complaints on this admission, pt still refusing to eat  -denying pain this am, no diarrhea, will continue to monitor  HTN: pt arrived with no clonidine patch, unsure if she was taking amlodipine at home  -restarted clonidine patch 0.3mg  weekly and amlodipine 10mg  daily   Dispo: Anticipated discharge in approximately 2-3 day(s).   Vickki Muff MD PGY-1 Internal Medicine Pager # [phone]

## 2017-11-04 ENCOUNTER — Encounter (HOSPITAL_COMMUNITY): Payer: Self-pay | Admitting: *Deleted

## 2017-11-04 ENCOUNTER — Other Ambulatory Visit: Payer: Self-pay

## 2017-11-04 DIAGNOSIS — M25461 Effusion, right knee: Secondary | ICD-10-CM

## 2017-11-04 DIAGNOSIS — Z885 Allergy status to narcotic agent status: Secondary | ICD-10-CM

## 2017-11-04 MED ORDER — CALCITRIOL 0.5 MCG PO CAPS
2.0000 ug | ORAL_CAPSULE | ORAL | Status: DC
Start: 2017-11-05 — End: 2017-11-04

## 2017-11-04 MED ORDER — PROMETHAZINE HCL 25 MG PO TABS
12.5000 mg | ORAL_TABLET | Freq: Four times a day (QID) | ORAL | Status: DC | PRN
  Administered 2017-11-04: 12.5 mg via ORAL
  Filled 2017-11-04: qty 1

## 2017-11-04 MED ORDER — PROMETHAZINE HCL 25 MG/ML IJ SOLN
6.2500 mg | Freq: Four times a day (QID) | INTRAMUSCULAR | Status: DC | PRN
  Filled 2017-11-04: qty 1

## 2017-11-04 NOTE — Progress Notes (Signed)
Patient discharged to home. Patient AVS reviewed and signed. Patient capable re-verbalizing medications and follow-up appointments. IV removed. Patient belongings sent with patient. Patient educated to return to the ED in the event of SOB, chest pain or dizziness.   Aveah Castell, RN 

## 2017-11-04 NOTE — Care Management Important Message (Signed)
Important Message  Patient Details  Name: Nancy [name] MRN: [id number] Date of Birth: 1940/12/02   Medicare Important Message Given:  Yes    Nancy [name] 11/04/2017, 2:39 PM

## 2017-11-04 NOTE — Progress Notes (Signed)
Internal Medicine Attending:   I saw and examined the patient. I reviewed the resident's note and I agree with the resident's findings and plan as documented in the resident's note.  Patient complains of some mild nausea today after breakfast.  She feels well otherwise and her appetite has improved.  She was able to eat half of her lunch and dinner yesterday and also ate about half of breakfast and after lunch today.  Patient was initially admitted for encephalopathy likely secondary to uremia and this in the setting of missed hemodialysis sessions.  Patient initially had psychiatry consulted who determined the patient did not have capacity.  Patient underwent hemodialysis twice while inpatient and is much improved now.  She has no further hallucinations and understands that she may die if she stops hemodialysis but also states that she may not.  Nephrology follow-up and recommendations appreciated.  Patient is stable for discharge home today with home PT/OT as well as social work follow-up.  Of note, patient also complained of some right knee swelling which he states is chronic.  The knee had no warmth or erythema or tenderness but an effusion was present.  Patient had arthrocentesis done today with drainage of approximately 70 cc of fluid.  Patient will need close follow-up as an outpatient in [facility].

## 2017-11-04 NOTE — Care Management Important Message (Signed)
Important Message  Patient Details  Name: Nancy [name] MRN: [id number] Date of Birth: February 19, 1941   Medicare Important Message Given:  Yes    Erenest Rasher, RN 11/04/2017, 1:55 PM

## 2017-11-04 NOTE — Progress Notes (Signed)
   Subjective:   Nancy [name] was able to tolerate PO intake yesterday, was able to eat half her lunch and dinner.  She continues to state that she has never missed a dialysis session despite missing several sessions over the last few weeks and sessions in the past as well.  I tried to explain that this was one of the reasons she was in the hospital, but she denies this.  She reports feeling nauseous this morning after eating "some old looking bacon"  Objective:  Vital signs in last 24 hours: Vitals:   11/03/17 1223 11/03/17 1612 11/03/17 1951 11/04/17 0628  BP: (!) 151/66 (!) 158/72 (!) 148/58 (!) 140/59  Pulse: 100 (!) 105 (!) 101 99  Resp: 18 16 16    Temp: 97.9 F (36.6 C) 98.9 F (37.2 C) 98.5 F (36.9 C) 98.4 F (36.9 C)  TempSrc: Oral Oral Oral   SpO2: 100% 98% 99%   Weight: 128 lb 8.5 oz (58.3 kg)      Physical Exam  Constitutional: No distress.  Cardiovascular: Normal rate and regular rhythm. Exam reveals no gallop and no friction rub.  Murmur (3/6 systolic) heard. Pulmonary/Chest: Effort normal and breath sounds normal. No respiratory distress. She has no wheezes. She has no rales. She exhibits no tenderness.  Abdominal: Soft. She exhibits no mass. There is no tenderness.  Musculoskeletal:   R knee effusion noted  Skin: She is not diaphoretic.  Psychiatric: She is not agitated, not slowed and not actively hallucinating.  Pt oriented to person, place, time.  Mental status with continued improvement    Assessment/Plan:  Principal Problem:   Evaluation by psychiatric service required Active Problems:   Confusion   Encephalopathy  Encephalopathy: likely uremic, agitated, bilateral hand tremors, pt  refusing dialysis, has not had a session in over one week, hallucinating, wanting to leave against medical advice.  Psychiatry consulted, determined pt does not have capacity.  Required one time dose of zyprexa so far. Improving with dialysis  -Day 3 of no hallucinations. Today  stated that she was told when she started dialysis that she may or may not die.  Did not endorse her opinion on this.   -will use PRN zyprexa 2.5mg  for agitation  ESRD: MWF dialysis  -nephrology consulted appreciate assistance -tolerated full dialysis session yesterday  Recent diverticulitis: pt with no abdominal complaints on this admission, pt appetite improving  -denying pain this am, does report nausea after breakfast but no pain  HTN: pt arrived with no clonidine patch, unsure if she was taking amlodipine at home  -restarted clonidine patch 0.3mg  weekly and amlodipine 10mg  daily   Dispo: Anticipated discharge home today with home health PT  Vickki Muff MD PGY-1 Internal Medicine Pager # [phone]

## 2017-11-04 NOTE — Progress Notes (Addendum)
[facility] Progress Note   Subjective:  Sitter at bedside Breakfast made her nauseous Alert, oriented. No c/os with HD yesterday.  Wants to go home   Objective Vitals:   11/03/17 1612 11/03/17 1951 11/04/17 0628 11/04/17 0927  BP: (!) 158/72 (!) 148/58 (!) 140/59 (!) 142/65  Pulse: (!) 105 (!) 101 99 98  Resp: 16 16  18   Temp: 98.9 F (37.2 C) 98.5 F (36.9 C) 98.4 F (36.9 C) 98.1 F (36.7 C)  TempSrc: Oral Oral  Oral  SpO2: 98% 99%  100%  Weight:       Physical Exam General: Elderly female NAD Heart: RRR 3/6 SEM  Lungs: CTAB  Abdomen: soft NT/ND Extremities: no LE edema  Dialysis Access: LUE AVF +bruit   Dialysis Orders:  east mwf 4h  60kg   300/800  2/2.5 bath left arm avf  Heparin 3000 Venofer 100mg  IV x10 until 4/24 Mircera 14mcg IV q 2weeks dosed 4/10 Calcitriol 2.0mg  PO TIW Auryxia 1 tab TID qac  Sensipar 60mg  PO bid    Assessment/Plan: 1. AMS/confusion/hallucinations - improving with dialysis 2. ESRD - MWF. Next HD 5/1  3. Anemia - Hgb >10. ESA not indicated 4. HTN/volume - BP/volume stable. Post HD weight 4/29 58.3kg  5. MBD CKD  -  Ca/Phos ok. Cont Calcitriol/Auryxia/Sensipar  6. Nutrition - Renal diet/vitamins   Lynnda Child PA-C [facility] Pager [phone] 11/04/2017,9:36 AM  LOS: 5 days   Pt seen, examined and agree w A/P as above.  Kelly Splinter MD [facility] pager [phone]   11/04/2017, 12:28 PM    Additional Objective Labs: Basic Metabolic Panel: Recent Labs  Lab 11/01/17 0802 11/02/17 0925 11/03/17 0814  NA 137 136 137  K 3.4* 3.9 3.8  CL 98* 98* 98*  CO2 18* 21* 24  GLUCOSE 74 88 92  BUN 79* 32* 39*  CREATININE 11.73* 6.85* 8.07*  CALCIUM 8.5* 8.7* 8.8*  PHOS 7.3* 5.9* 5.3*   CBC: Recent Labs  Lab 10/31/17 0938 11/01/17 0802 11/02/17 0925 11/03/17 0814  WBC 7.7 8.3 7.1 6.5  HGB 11.6* 10.9* 11.5* 10.7*  HCT 36.2 34.0* 36.2 34.0*  MCV 96.5 96.9 99.2 97.7   PLT 198 192 190 215   Blood Culture    Component Value Date/Time   SDES BLOOD RIGHT WRIST 10/24/2017 0811   SPECREQUEST  10/24/2017 0811    BOTTLES DRAWN AEROBIC AND ANAEROBIC Blood Culture results may not be optimal due to an inadequate volume of blood received in culture bottles   CULT  10/24/2017 0811    NO GROWTH 5 DAYS Performed at [facility], [place] [address] [place], [place] [address]    REPTSTATUS 10/29/2017 FINAL 10/24/2017 0811    Cardiac Enzymes: No results for input(s): CKTOTAL, CKMB, CKMBINDEX, TROPONINI in the last 168 hours. CBG: Recent Labs  Lab 11/01/17 0931  GLUCAP 82   Iron Studies: No results for input(s): IRON, TIBC, TRANSFERRIN, FERRITIN in the last 72 hours. Lab Results  Component Value Date   INR 1.30 02/17/2017   INR 1.14 09/21/2016   INR 1.06 07/13/2012   Medications:  . amLODipine  5 mg Oral Daily  . aspirin EC  81 mg Oral Daily  . calcitRIOL  0.25 mcg Oral Daily  . calcium acetate  1,334 mg Oral TID WC  . cinacalcet  60 mg Oral Daily  . cloNIDine  0.2 mg Transdermal Weekly  . ferric citrate  210 mg Oral Daily  .  heparin  5,000 Units Subcutaneous Q8H

## 2017-11-04 NOTE — Care Management Note (Addendum)
Case Management Note  Patient Details  Name: Nancy [name] MRN: [id number] Date of Birth: Aug 25, 1940  Subjective/Objective:    Acute Encephalopathy                Action/Plan: NCM spoke to pt and offered choice for HH/list provided. Pt agreeable to [facility] for HH. States she has a Optician, dispensing that comes 3 days per week for 3 hours. Contacted [facility] with new referral. No DME needed. Medicaid Transportation is with [employer].   PCP Aldine Contes MD   Expected Discharge Date:  11/04/2016              Expected Discharge Plan:  [place]  In-House Referral:  NA  Discharge planning Services  CM Consult  Post Acute Care Choice:  Home Health Choice offered to:  Patient  DME Arranged:  N/A DME Agency:  NA  HH Arranged:  RN, PT, Social Work [employer] Agency:  [facility]  Status of Service:  Completed, signed off  If discussed at [employer], dates discussed:    Additional Comments:  Erenest Rasher, RN 11/04/2017, 11:43 AM

## 2017-11-04 NOTE — Progress Notes (Signed)
Procedure Note: Right Knee Arthrocentesis   Indication:  Right Knee Effusion   Operators: Drs Philipp Ovens and Heber Dalmatia   Patient consented to right knee arthrocentesis for therapeutic relief of right knee effusion. After a time out was preformed, the knee was prepped in a sterile fashion. The skin was cleaned with betadine over the insertion site (lateral superior patellar aspect of the knee). 2 cc of 1% lidocaine was used to numb the injection site prior to arthrocentesis.  The right knee space was entered without difficulty using an 18 gauge 1.5 inch needle. A total of 70 cc of blood tinged fluid was removed from the joint space. The patient tolerated the procedure well without complication.

## 2017-11-05 ENCOUNTER — Encounter: Payer: Self-pay | Admitting: Nephrology

## 2017-11-05 DIAGNOSIS — D631 Anemia in chronic kidney disease: Secondary | ICD-10-CM | POA: Diagnosis not present

## 2017-11-05 DIAGNOSIS — R51 Headache: Secondary | ICD-10-CM | POA: Diagnosis not present

## 2017-11-05 DIAGNOSIS — N186 End stage renal disease: Secondary | ICD-10-CM | POA: Diagnosis not present

## 2017-11-05 DIAGNOSIS — D689 Coagulation defect, unspecified: Secondary | ICD-10-CM | POA: Diagnosis not present

## 2017-11-05 DIAGNOSIS — N2581 Secondary hyperparathyroidism of renal origin: Secondary | ICD-10-CM | POA: Diagnosis not present

## 2017-11-05 DIAGNOSIS — Z23 Encounter for immunization: Secondary | ICD-10-CM | POA: Diagnosis not present

## 2017-11-06 ENCOUNTER — Ambulatory Visit (INDEPENDENT_AMBULATORY_CARE_PROVIDER_SITE_OTHER): Payer: Medicare Other | Admitting: Internal Medicine

## 2017-11-06 VITALS — BP 158/67 | HR 102 | Temp 98.8°F | Wt 131.8 lb

## 2017-11-06 DIAGNOSIS — Z992 Dependence on renal dialysis: Secondary | ICD-10-CM | POA: Diagnosis not present

## 2017-11-06 DIAGNOSIS — R112 Nausea with vomiting, unspecified: Secondary | ICD-10-CM | POA: Diagnosis not present

## 2017-11-06 DIAGNOSIS — R011 Cardiac murmur, unspecified: Secondary | ICD-10-CM

## 2017-11-06 MED ORDER — PROMETHAZINE HCL 12.5 MG PO TABS: 12.5000 mg | ORAL_TABLET | Freq: Four times a day (QID) | ORAL | 0 refills | Status: DC | PRN

## 2017-11-06 NOTE — Progress Notes (Deleted)
   CC: ***  HPI:  Ms.Tishie B Ikner is a 77 y.o. female with PMHx detailed below presenting ***  See problem based assessment and plan below for additional details.  No problem-specific Assessment & Plan notes found for this encounter.   Past Medical History:  Diagnosis Date  . Anemia   . Aortic stenosis   . Bacterial sinusitis 09/17/2011  . CHF (congestive heart failure) ([place])   . CKD (chronic kidney disease) stage 4, GFR 15-29 ml/min ([place]) 08/11/2006   Cr continues to increase. Proteinuria on UA 02/10/12.    . Colitis   . CVA (cerebrovascular accident) [facility])    New hemorrhagic per CT scan '09  . Diverticulosis of colon   . Dysfunctional uterine bleeding   . Fecal impaction ([place])   . Headache(784.0)   . Heart murmur   . HERNIORRHAPHY, HX OF 08/11/2006  . Hypertension   . OA (osteoarthritis)    bilateral knees  . Postmenopausal   . Pulmonary nodule   . TINEA CRURIS 01/12/2007    Review of Systems: ROS   Physical Exam: Vitals:   11/06/17 1054  BP: (!) 158/67  Pulse: (!) 102  Temp: 98.8 F (37.1 C)  TempSrc: Oral  SpO2: 100%  Weight: 131 lb 12.8 oz (59.8 kg)   GENERAL- alert, co-operative, NAD HEENT- Atraumatic, PERRL, EOMI, oral mucosa appears moist, good and intact dentition, no carotid bruit, no cervical LN enlargement. CARDIAC- RRR, no murmurs, rubs or gallops. RESP- CTAB, no wheezes or crackles. ABDOMEN- Soft, nontender, no guarding or rebound, normoactive bowel sounds present BACK- Normal curvature, no paraspinal tenderness, no CVA tenderness. NEURO- No obvious Cr N abnormality, strength upper and lower extremities- 5/5, Sensation intact globally EXTREMITIES- pulse 2+, symmetric, no pedal edema. SKIN- Warm, dry, No rash or lesion. PSYCH- Normal mood and affect, appropriate thought content and speech. ***   Assessment & Plan:   See encounters tab for problem based medical decision making.   Patient {GC/GE:3044014::"discussed with","seen with"} Dr.  {NAMES:[id number]::"Butcher","Granfortuna","E. Hoffman","Klima","Mullen","Narendra","Raines","Vincent"}

## 2017-11-06 NOTE — Patient Instructions (Signed)
Nancy [name], sorry you are getting nauseous after dialysis.  Please talk with your kidney doctor about ways to prevent post dialysis hypotension.  I have given you some nausea medicine phenergan that will help in the mean time.  Please also pick up your stool softeners. It will be important to have regular bowel movements to avoid constipation and further irritation of your colon.

## 2017-11-06 NOTE — Assessment & Plan Note (Addendum)
Patient comes in with a similar complaint when she was initially admitted a few weeks prior.  After her dialysis session on Wednesday he began feeling dizzy lightheaded and nauseous she is unsure of her blood pressure dropped after dialysis.  When she got home she felt nauseous and threw up once.  She did not have an appetite this morning but did not vomit.  Before her dialysis session after leaving the hospital she was able to eat and drink without issue.  Suspect that 1 of 2 things may be happening possibly both.  She may be getting hypotensive after her dialysis sessions, dizziness diaphoresis and nausea.  Also given her vascular disease it may be possible that the extracorporeal blood running during dialysis and possible hypotension may be contributing to decreased perfusion of her stomach and intestines causing her to have abdominal pain and nausea after her dialysis episodes.  Her abdominal exam during this visit is very reassuring normal bowel sounds, no pain throughout soft nondistended abdomen  -Unfortunately is not much we can do to prevent these episodes, I asked that she make nephrology aware during her next session to see if anything can be done as far as slower fluid removal etc. -By the time our interview was over the patient endorsed some hunger enough to try some food -Patient has Phenergan at her pharmacy from her last discharge but has not picked it up.  We will give patient a printed prescription in case the anti-emetic is not available at the pharmacy although would be unusual for all the other medications to be electronically transferred seamlessly and this one not. -Patient has not picked up stool softeners, emphasized the importance of having regular bowel movement daily which will additionally help with her nausea and prevent any further episodes of left lower quadrant pain

## 2017-11-06 NOTE — Progress Notes (Signed)
CC: post dialysis nausea  HPI:  [name] is a 77 y.o. female with past medical history below.  She is here due to another episode of recurrent nausea that began after her dialysis session.  Please see A&P for status of the patient's chronic medical conditions  Past Medical History:  Diagnosis Date  . Anemia   . Aortic stenosis   . Bacterial sinusitis 09/17/2011  . CHF (congestive heart failure) ([place])   . CKD (chronic kidney disease) stage 4, GFR 15-29 ml/min ([place]) 08/11/2006   Cr continues to increase. Proteinuria on UA 02/10/12.    . Colitis   . CVA (cerebrovascular accident) [facility])    New hemorrhagic per CT scan '09  . Diverticulosis of colon   . Dysfunctional uterine bleeding   . Fecal impaction ([place])   . Headache(784.0)   . Heart murmur   . HERNIORRHAPHY, HX OF 08/11/2006  . Hypertension   . OA (osteoarthritis)    bilateral knees  . Postmenopausal   . Pulmonary nodule   . TINEA CRURIS 01/12/2007   Review of Systems:  ROS: Pulmonary: pt denies increased work of breathing, shortness of breath,  Cardiac: pt denies palpitations, chest pain,  Abdominal: pt denies abdominal pain, does endorse nausea, no vomiting today but did yesterday after dialysis, no diarrhea  Physical Exam:  Vitals:   11/06/17 1054  BP: (!) 158/67  Pulse: (!) 102  Temp: 98.8 F (37.1 C)  TempSrc: Oral  SpO2: 100%  Weight: 131 lb 12.8 oz (59.8 kg)   Physical Exam  Constitutional: She is oriented to person, place, and time. No distress.  Cardiovascular: Regular rhythm. Exam reveals no gallop and no friction rub.  Murmur (3/6 systolic murmur RUSB) heard. Pulmonary/Chest: Effort normal and breath sounds normal. No respiratory distress. She has no wheezes. She has no rales. She exhibits no tenderness.  Abdominal: Soft. Bowel sounds are normal. She exhibits no distension and no mass. There is no tenderness. There is no rebound and no guarding.  Neurological: She is alert and oriented to person,  place, and time.  Skin: She is not diaphoretic.    Social History   Socioeconomic History  . Marital status: Married    Spouse name: Not on file  . Number of children: Not on file  . Years of education: Not on file  . Highest education level: Not on file  Occupational History  . Not on file  Social Needs  . Financial resource strain: Not on file  . Food insecurity:    Worry: Not on file    Inability: Not on file  . Transportation needs:    Medical: Not on file    Non-medical: Not on file  Tobacco Use  . Smoking status: Never Smoker  . Smokeless tobacco: Never Used  Substance and Sexual Activity  . Alcohol use: No    Alcohol/week: 0.0 oz  . Drug use: No    Comment: 08/15/08 UDS + cocaine  . Sexual activity: Not Currently  Lifestyle  . Physical activity:    Days per week: Not on file    Minutes per session: Not on file  . Stress: Not on file  Relationships  . Social connections:    Talks on phone: Not on file    Gets together: Not on file    Attends religious service: Not on file    Active member of club or organization: Not on file    Attends meetings of clubs or organizations: Not on  file    Relationship status: Not on file  . Intimate partner violence:    Fear of current or ex partner: Not on file    Emotionally abused: Not on file    Physically abused: Not on file    Forced sexual activity: Not on file  Other Topics Concern  . Not on file  Social History Narrative   Married, lives with her husband. 1 child.           Family History  Problem Relation Age of Onset  . Hypertension Mother   . Congestive Heart Failure Mother   . Heart attack Brother 31    Assessment & Plan:   See Encounters Tab for problem based charting.  Patient discussed with Dr. Beryle Beams

## 2017-11-09 DIAGNOSIS — K5792 Diverticulitis of intestine, part unspecified, without perforation or abscess without bleeding: Secondary | ICD-10-CM | POA: Diagnosis not present

## 2017-11-09 DIAGNOSIS — G4700 Insomnia, unspecified: Secondary | ICD-10-CM | POA: Diagnosis not present

## 2017-11-09 DIAGNOSIS — Z8673 Personal history of transient ischemic attack (TIA), and cerebral infarction without residual deficits: Secondary | ICD-10-CM | POA: Diagnosis not present

## 2017-11-09 DIAGNOSIS — I509 Heart failure, unspecified: Secondary | ICD-10-CM | POA: Diagnosis not present

## 2017-11-09 DIAGNOSIS — N186 End stage renal disease: Secondary | ICD-10-CM | POA: Diagnosis not present

## 2017-11-09 DIAGNOSIS — I350 Nonrheumatic aortic (valve) stenosis: Secondary | ICD-10-CM | POA: Diagnosis not present

## 2017-11-09 DIAGNOSIS — Z992 Dependence on renal dialysis: Secondary | ICD-10-CM | POA: Diagnosis not present

## 2017-11-09 DIAGNOSIS — I132 Hypertensive heart and chronic kidney disease with heart failure and with stage 5 chronic kidney disease, or end stage renal disease: Secondary | ICD-10-CM | POA: Diagnosis not present

## 2017-11-09 NOTE — Progress Notes (Signed)
Medicine attending: Medical history, presenting problems, physical findings, and medications, reviewed with resident physician Dr William Winfrey on the day of the patient visit and I concur with his evaluation and management plan. 

## 2017-11-10 DIAGNOSIS — D689 Coagulation defect, unspecified: Secondary | ICD-10-CM | POA: Diagnosis not present

## 2017-11-10 DIAGNOSIS — R51 Headache: Secondary | ICD-10-CM | POA: Diagnosis not present

## 2017-11-10 DIAGNOSIS — D631 Anemia in chronic kidney disease: Secondary | ICD-10-CM | POA: Diagnosis not present

## 2017-11-10 DIAGNOSIS — N2581 Secondary hyperparathyroidism of renal origin: Secondary | ICD-10-CM | POA: Diagnosis not present

## 2017-11-10 DIAGNOSIS — Z23 Encounter for immunization: Secondary | ICD-10-CM | POA: Diagnosis not present

## 2017-11-10 DIAGNOSIS — N186 End stage renal disease: Secondary | ICD-10-CM | POA: Diagnosis not present

## 2017-11-11 ENCOUNTER — Telehealth: Payer: Self-pay | Admitting: *Deleted

## 2017-11-11 NOTE — Telephone Encounter (Signed)
Call from patient's granddaughter requesting office appointment for "painful knot at left upper extremity A/V Fistula. "Looks like it has moved from her arm pit to now a her HD access. Discussed this with staff at [facility]  Without any resolution. Family member to come with patient to appt with NP on 11/13/17.

## 2017-11-12 DIAGNOSIS — D631 Anemia in chronic kidney disease: Secondary | ICD-10-CM | POA: Diagnosis not present

## 2017-11-12 DIAGNOSIS — N2581 Secondary hyperparathyroidism of renal origin: Secondary | ICD-10-CM | POA: Diagnosis not present

## 2017-11-12 DIAGNOSIS — Z23 Encounter for immunization: Secondary | ICD-10-CM | POA: Diagnosis not present

## 2017-11-12 DIAGNOSIS — R51 Headache: Secondary | ICD-10-CM | POA: Diagnosis not present

## 2017-11-12 DIAGNOSIS — D689 Coagulation defect, unspecified: Secondary | ICD-10-CM | POA: Diagnosis not present

## 2017-11-12 DIAGNOSIS — N186 End stage renal disease: Secondary | ICD-10-CM | POA: Diagnosis not present

## 2017-11-13 ENCOUNTER — Encounter: Payer: Self-pay | Admitting: Family

## 2017-11-13 ENCOUNTER — Ambulatory Visit (INDEPENDENT_AMBULATORY_CARE_PROVIDER_SITE_OTHER): Payer: Medicare Other | Admitting: Family

## 2017-11-13 ENCOUNTER — Other Ambulatory Visit: Payer: Self-pay

## 2017-11-13 VITALS — BP 131/63 | HR 87 | Temp 98.2°F | Resp 16

## 2017-11-13 DIAGNOSIS — I770 Arteriovenous fistula, acquired: Secondary | ICD-10-CM | POA: Diagnosis not present

## 2017-11-13 DIAGNOSIS — Z992 Dependence on renal dialysis: Secondary | ICD-10-CM | POA: Diagnosis not present

## 2017-11-13 DIAGNOSIS — N186 End stage renal disease: Secondary | ICD-10-CM

## 2017-11-13 DIAGNOSIS — Z8673 Personal history of transient ischemic attack (TIA), and cerebral infarction without residual deficits: Secondary | ICD-10-CM | POA: Diagnosis not present

## 2017-11-13 DIAGNOSIS — I132 Hypertensive heart and chronic kidney disease with heart failure and with stage 5 chronic kidney disease, or end stage renal disease: Secondary | ICD-10-CM | POA: Diagnosis not present

## 2017-11-13 DIAGNOSIS — I509 Heart failure, unspecified: Secondary | ICD-10-CM | POA: Diagnosis not present

## 2017-11-13 DIAGNOSIS — K5792 Diverticulitis of intestine, part unspecified, without perforation or abscess without bleeding: Secondary | ICD-10-CM | POA: Diagnosis not present

## 2017-11-13 DIAGNOSIS — T148XXA Other injury of unspecified body region, initial encounter: Secondary | ICD-10-CM

## 2017-11-13 DIAGNOSIS — G4700 Insomnia, unspecified: Secondary | ICD-10-CM | POA: Diagnosis not present

## 2017-11-13 DIAGNOSIS — I350 Nonrheumatic aortic (valve) stenosis: Secondary | ICD-10-CM | POA: Diagnosis not present

## 2017-11-13 NOTE — Progress Notes (Signed)
CC: "knot" on AV fistula x 2 weeks  History of Present Illness  Nancy [name] is a 77 y.o. (Dec 21, 1940) female who is s/p 1st stage BVT with [facility] placement by Dr. Donnetta Hutching on 02/19/17.  She underwent the 2nd stage BVT on 04/23/17.    Dr. Donnetta Hutching last evaluated pt on 06-03-17. At that time the incision was well healed. +palpable left radial pulse; +thrill/bruit within the fistula; easily palpable Dr. Donnetta Hutching felt we should give her fistula another month and then may start using at the end of December 2018.  Once the fistula has been used 2-3 times successfully, we will schedule to have her TDC removed.  Otherwise, we will see her back as needed.   She returns today with c/o "knot" in her left axilla that she noticed about about 2 weeks ago, and states the knot has "travelled" further distally to an area proximal to her left AV fistula, has not increased in size. The knot is slightly painful. She denies fever or chills. She has tingling and numbness in her left hand, but only during dialysis.   She dialyzes M/W/F at the [facility] in [place] on [address].     Past Medical History:  Diagnosis Date  . Anemia   . Aortic stenosis   . Bacterial sinusitis 09/17/2011  . CHF (congestive heart failure) ([place])   . CKD (chronic kidney disease) stage 4, GFR 15-29 ml/min ([place]) 08/11/2006   Cr continues to increase. Proteinuria on UA 02/10/12.    . Colitis   . CVA (cerebrovascular accident) [facility])    New hemorrhagic per CT scan '09  . Diverticulosis of colon   . Dysfunctional uterine bleeding   . Fecal impaction ([place])   . Headache(784.0)   . Heart murmur   . HERNIORRHAPHY, HX OF 08/11/2006  . Hypertension   . OA (osteoarthritis)    bilateral knees  . Postmenopausal   . Pulmonary nodule   . TINEA CRURIS 01/12/2007    Social History Social History   Tobacco Use  . Smoking status: Never Smoker  . Smokeless tobacco: Never Used  Substance Use Topics  . Alcohol use: No    Alcohol/week: 0.0 oz  .  Drug use: No    Comment: 08/15/08 UDS + cocaine    Family History Family History  Problem Relation Age of Onset  . Hypertension Mother   . Congestive Heart Failure Mother   . Heart attack Brother 69    Surgical History Past Surgical History:  Procedure Laterality Date  . ABDOMINAL HYSTERECTOMY    . AV FISTULA PLACEMENT Left 02/19/2017   Procedure: CREATION OF LEFT ARM BRACHIOCEPHALIC ARTERIOVENOUS (AV) FISTULA;  Surgeon: Rosetta Posner, MD;  Location: [place];  Service: Vascular;  Laterality: Left;  . BASCILIC VEIN TRANSPOSITION Left 04/23/2017   Procedure: BASILIC VEIN TRANSPOSITION SECOND STAGE;  Surgeon: Rosetta Posner, MD;  Location: [place];  Service: Vascular;  Laterality: Left;  . CHOLECYSTECTOMY  2009  . COLONOSCOPY    . INGUINAL HERNIA REPAIR  2008  . INSERTION OF DIALYSIS CATHETER Right 02/19/2017   Procedure: INSERTION OF TUNNELED DIALYSIS CATHETER - RIGHT INTERNAL JUGULAR PLACEMENT;  Surgeon: Rosetta Posner, MD;  Location: [place];  Service: Vascular;  Laterality: Right;  . IRIDOTOMY / IRIDECTOMY     Laser, right eye 12/26/11 left eye 01/24/12  . MASS EXCISION Left 05/07/2013   Procedure: EXCISION CYST;  Surgeon: Myrtha Mantis., MD;  Location: [place];  Service:  Ophthalmology;  Laterality: Left;    Allergies  Allergen Reactions  . Hydrocodone-Acetaminophen Nausea And Vomiting and Other (See Comments)    Dizziness (also)    Current Outpatient Medications  Medication Sig Dispense Refill  . acetaminophen (TYLENOL) 500 MG tablet Take 2 tablets (1,000 mg total) by mouth every 8 (eight) hours as needed for mild pain, moderate pain, fever or headache. 30 tablet 0  . amLODipine (NORVASC) 10 MG tablet Take 1/2 tablet by mouth according to prior schedule (Patient taking differently: Take 1/2 tablet by mouth on M, W, F only) 30 tablet 5  . aspirin EC 81 MG EC tablet Take 1 tablet (81 mg total) by mouth daily. 30 tablet 1  . calcitRIOL (ROCALTROL) 0.25 MCG  capsule Take 1 capsule (0.25 mcg total) by mouth daily. (Patient taking differently: Take 0.25 mcg by mouth daily. Takes on M, W, F only) 90 capsule 0  . calcium acetate (PHOSLO) 667 MG capsule Take 2 capsules (1,334 mg total) by mouth 3 (three) times daily with meals. 90 capsule 1  . cloNIDine (CATAPRES - DOSED IN MG/24 HR) 0.2 mg/24hr patch Place 1 patch (0.2 mg total) onto the skin every 7 (seven) days. 12 patch 3  . ferric citrate (AURYXIA) 1 GM 210 MG(Fe) tablet Take 210 mg by mouth daily.    . nitroGLYCERIN (NITROSTAT) 0.3 MG SL tablet Place 1 tablet (0.3 mg total) under the tongue every 5 (five) minutes as needed for chest pain. 30 tablet 0  . polyethylene glycol (MIRALAX / GLYCOLAX) packet Take 17 g by mouth daily. 30 each 0  . senna-docusate (SENOKOT-S) 8.6-50 MG tablet Take 1 tablet by mouth at bedtime as needed for mild constipation or moderate constipation. 30 tablet 0  . SENSIPAR 60 MG tablet Take 1 tablet (60 mg total) by mouth daily. 30 tablet 6  . promethazine (PHENERGAN) 12.5 MG tablet Take 1 tablet (12.5 mg total) by mouth every 6 (six) hours as needed for up to 2 days for nausea or vomiting. 8 tablet 0   No current facility-administered medications for this visit.      REVIEW OF SYSTEMS: see HPI for pertinent positives and negatives    PHYSICAL EXAMINATION:  Vitals:   11/13/17 1135  BP: 131/63  Pulse: 87  Resp: 16  Temp: 98.2 F (36.8 C)  TempSrc: Oral  SpO2: 100%   There is no height or weight on file to calculate BMI.  General: The patient appears her stated age.   HEENT:  No gross abnormalities Pulmonary: Respirations are non-labored, CTAB Abdomen: Soft and non-tender Musculoskeletal: There are no major deformities.   Neurologic: No focal weakness or paresthesias are detected Skin: There are no ulcer or rashes noted. Psychiatric: The patient has normal affect. Cardiovascular: There is a regular rate and rhythm without significant murmur appreciated.    Right radial pulse is 1+ palpable, left is 2+ palpable. Palpable thrill at left upper arm AV fistula, except where the small hematoma is at the proximal segment of the AVF, but there is an audible bruit next to the hematoma, no erythema.    Medical Decision Making  Nancy [name] is a 77 y.o. female who is s/p 1st stage BVT with [facility] placement by Dr. Donnetta Hutching on 02/19/17.  She underwent the 2nd stage BVT on 04/23/17.     Dr. Oneida Alar spoke with pt and granddaughter and examined pt.   Hematoma adjacent to proximal segment of left upper arm AV fistula. Dr. Oneida Alar made two marks  on the AVF, between where the techs may access; do not access close to or at the hematoma. Pt's granddaughter states she will reinforce this message to pt's kidney center.   Follow up as needed.   Clemon Chambers, RN, MSN, FNP-C [facility] Office: [phone]  11/13/2017, 12:03 PM  Clinic MD: Oneida Alar

## 2017-11-14 DIAGNOSIS — D689 Coagulation defect, unspecified: Secondary | ICD-10-CM | POA: Diagnosis not present

## 2017-11-14 DIAGNOSIS — N186 End stage renal disease: Secondary | ICD-10-CM | POA: Diagnosis not present

## 2017-11-14 DIAGNOSIS — R51 Headache: Secondary | ICD-10-CM | POA: Diagnosis not present

## 2017-11-14 DIAGNOSIS — N2581 Secondary hyperparathyroidism of renal origin: Secondary | ICD-10-CM | POA: Diagnosis not present

## 2017-11-14 DIAGNOSIS — D631 Anemia in chronic kidney disease: Secondary | ICD-10-CM | POA: Diagnosis not present

## 2017-11-14 DIAGNOSIS — Z23 Encounter for immunization: Secondary | ICD-10-CM | POA: Diagnosis not present

## 2017-11-14 NOTE — Telephone Encounter (Signed)
F/U appt 11/06/2017 with the [facility]

## 2017-11-17 DIAGNOSIS — R51 Headache: Secondary | ICD-10-CM | POA: Diagnosis not present

## 2017-11-17 DIAGNOSIS — N2581 Secondary hyperparathyroidism of renal origin: Secondary | ICD-10-CM | POA: Diagnosis not present

## 2017-11-17 DIAGNOSIS — N186 End stage renal disease: Secondary | ICD-10-CM | POA: Diagnosis not present

## 2017-11-17 DIAGNOSIS — Z23 Encounter for immunization: Secondary | ICD-10-CM | POA: Diagnosis not present

## 2017-11-17 DIAGNOSIS — D631 Anemia in chronic kidney disease: Secondary | ICD-10-CM | POA: Diagnosis not present

## 2017-11-17 DIAGNOSIS — D689 Coagulation defect, unspecified: Secondary | ICD-10-CM | POA: Diagnosis not present

## 2017-11-18 DIAGNOSIS — K5792 Diverticulitis of intestine, part unspecified, without perforation or abscess without bleeding: Secondary | ICD-10-CM | POA: Diagnosis not present

## 2017-11-18 DIAGNOSIS — I350 Nonrheumatic aortic (valve) stenosis: Secondary | ICD-10-CM | POA: Diagnosis not present

## 2017-11-18 DIAGNOSIS — I132 Hypertensive heart and chronic kidney disease with heart failure and with stage 5 chronic kidney disease, or end stage renal disease: Secondary | ICD-10-CM | POA: Diagnosis not present

## 2017-11-18 DIAGNOSIS — I509 Heart failure, unspecified: Secondary | ICD-10-CM | POA: Diagnosis not present

## 2017-11-18 DIAGNOSIS — Z8673 Personal history of transient ischemic attack (TIA), and cerebral infarction without residual deficits: Secondary | ICD-10-CM | POA: Diagnosis not present

## 2017-11-18 DIAGNOSIS — N186 End stage renal disease: Secondary | ICD-10-CM | POA: Diagnosis not present

## 2017-11-18 DIAGNOSIS — Z992 Dependence on renal dialysis: Secondary | ICD-10-CM | POA: Diagnosis not present

## 2017-11-18 DIAGNOSIS — G4700 Insomnia, unspecified: Secondary | ICD-10-CM | POA: Diagnosis not present

## 2017-11-19 DIAGNOSIS — N2581 Secondary hyperparathyroidism of renal origin: Secondary | ICD-10-CM | POA: Diagnosis not present

## 2017-11-19 DIAGNOSIS — N186 End stage renal disease: Secondary | ICD-10-CM | POA: Diagnosis not present

## 2017-11-19 DIAGNOSIS — D689 Coagulation defect, unspecified: Secondary | ICD-10-CM | POA: Diagnosis not present

## 2017-11-19 DIAGNOSIS — D631 Anemia in chronic kidney disease: Secondary | ICD-10-CM | POA: Diagnosis not present

## 2017-11-19 DIAGNOSIS — Z23 Encounter for immunization: Secondary | ICD-10-CM | POA: Diagnosis not present

## 2017-11-19 DIAGNOSIS — R51 Headache: Secondary | ICD-10-CM | POA: Diagnosis not present

## 2017-11-20 DIAGNOSIS — K5792 Diverticulitis of intestine, part unspecified, without perforation or abscess without bleeding: Secondary | ICD-10-CM | POA: Diagnosis not present

## 2017-11-20 DIAGNOSIS — I350 Nonrheumatic aortic (valve) stenosis: Secondary | ICD-10-CM | POA: Diagnosis not present

## 2017-11-20 DIAGNOSIS — Z8673 Personal history of transient ischemic attack (TIA), and cerebral infarction without residual deficits: Secondary | ICD-10-CM | POA: Diagnosis not present

## 2017-11-20 DIAGNOSIS — G4700 Insomnia, unspecified: Secondary | ICD-10-CM | POA: Diagnosis not present

## 2017-11-20 DIAGNOSIS — I509 Heart failure, unspecified: Secondary | ICD-10-CM | POA: Diagnosis not present

## 2017-11-20 DIAGNOSIS — N186 End stage renal disease: Secondary | ICD-10-CM | POA: Diagnosis not present

## 2017-11-20 DIAGNOSIS — I132 Hypertensive heart and chronic kidney disease with heart failure and with stage 5 chronic kidney disease, or end stage renal disease: Secondary | ICD-10-CM | POA: Diagnosis not present

## 2017-11-20 DIAGNOSIS — Z992 Dependence on renal dialysis: Secondary | ICD-10-CM | POA: Diagnosis not present

## 2017-11-21 DIAGNOSIS — R51 Headache: Secondary | ICD-10-CM | POA: Diagnosis not present

## 2017-11-21 DIAGNOSIS — Z23 Encounter for immunization: Secondary | ICD-10-CM | POA: Diagnosis not present

## 2017-11-21 DIAGNOSIS — D631 Anemia in chronic kidney disease: Secondary | ICD-10-CM | POA: Diagnosis not present

## 2017-11-21 DIAGNOSIS — N2581 Secondary hyperparathyroidism of renal origin: Secondary | ICD-10-CM | POA: Diagnosis not present

## 2017-11-21 DIAGNOSIS — D689 Coagulation defect, unspecified: Secondary | ICD-10-CM | POA: Diagnosis not present

## 2017-11-21 DIAGNOSIS — N186 End stage renal disease: Secondary | ICD-10-CM | POA: Diagnosis not present

## 2017-11-24 DIAGNOSIS — D631 Anemia in chronic kidney disease: Secondary | ICD-10-CM | POA: Diagnosis not present

## 2017-11-24 DIAGNOSIS — N186 End stage renal disease: Secondary | ICD-10-CM | POA: Diagnosis not present

## 2017-11-24 DIAGNOSIS — N2581 Secondary hyperparathyroidism of renal origin: Secondary | ICD-10-CM | POA: Diagnosis not present

## 2017-11-24 DIAGNOSIS — Z23 Encounter for immunization: Secondary | ICD-10-CM | POA: Diagnosis not present

## 2017-11-24 DIAGNOSIS — D689 Coagulation defect, unspecified: Secondary | ICD-10-CM | POA: Diagnosis not present

## 2017-11-24 DIAGNOSIS — R51 Headache: Secondary | ICD-10-CM | POA: Diagnosis not present

## 2017-11-25 ENCOUNTER — Ambulatory Visit: Payer: Medicare Other | Admitting: Internal Medicine

## 2017-11-26 ENCOUNTER — Other Ambulatory Visit (HOSPITAL_COMMUNITY): Payer: Self-pay

## 2017-11-26 ENCOUNTER — Other Ambulatory Visit: Payer: Self-pay

## 2017-11-27 ENCOUNTER — Other Ambulatory Visit: Payer: Self-pay

## 2017-11-27 ENCOUNTER — Emergency Department (HOSPITAL_COMMUNITY): Payer: Medicare Other

## 2017-11-27 ENCOUNTER — Encounter (HOSPITAL_COMMUNITY): Payer: Self-pay | Admitting: Emergency Medicine

## 2017-11-27 ENCOUNTER — Observation Stay (HOSPITAL_COMMUNITY)
Admission: EM | Admit: 2017-11-27 | Discharge: 2017-11-28 | Disposition: A | Discharge status: Home / Self Care | Payer: Medicare Other | Attending: Student in an Organized Health Care Education/Training Program | Admitting: Student in an Organized Health Care Education/Training Program

## 2017-11-27 DIAGNOSIS — I509 Heart failure, unspecified: Secondary | ICD-10-CM | POA: Diagnosis not present

## 2017-11-27 DIAGNOSIS — R079 Chest pain, unspecified: Secondary | ICD-10-CM | POA: Diagnosis not present

## 2017-11-27 DIAGNOSIS — G8929 Other chronic pain: Secondary | ICD-10-CM | POA: Diagnosis not present

## 2017-11-27 DIAGNOSIS — Z66 Do not resuscitate: Secondary | ICD-10-CM

## 2017-11-27 DIAGNOSIS — R011 Cardiac murmur, unspecified: Secondary | ICD-10-CM

## 2017-11-27 DIAGNOSIS — R112 Nausea with vomiting, unspecified: Secondary | ICD-10-CM | POA: Diagnosis present

## 2017-11-27 DIAGNOSIS — Z992 Dependence on renal dialysis: Secondary | ICD-10-CM | POA: Diagnosis not present

## 2017-11-27 DIAGNOSIS — Z79899 Other long term (current) drug therapy: Secondary | ICD-10-CM | POA: Diagnosis not present

## 2017-11-27 DIAGNOSIS — Z7982 Long term (current) use of aspirin: Secondary | ICD-10-CM | POA: Diagnosis not present

## 2017-11-27 DIAGNOSIS — R0789 Other chest pain: Secondary | ICD-10-CM | POA: Insufficient documentation

## 2017-11-27 DIAGNOSIS — N189 Chronic kidney disease, unspecified: Secondary | ICD-10-CM | POA: Diagnosis present

## 2017-11-27 DIAGNOSIS — M1990 Unspecified osteoarthritis, unspecified site: Secondary | ICD-10-CM | POA: Diagnosis not present

## 2017-11-27 DIAGNOSIS — I120 Hypertensive chronic kidney disease with stage 5 chronic kidney disease or end stage renal disease: Secondary | ICD-10-CM | POA: Diagnosis not present

## 2017-11-27 DIAGNOSIS — D631 Anemia in chronic kidney disease: Secondary | ICD-10-CM | POA: Insufficient documentation

## 2017-11-27 DIAGNOSIS — I132 Hypertensive heart and chronic kidney disease with heart failure and with stage 5 chronic kidney disease, or end stage renal disease: Secondary | ICD-10-CM | POA: Diagnosis not present

## 2017-11-27 DIAGNOSIS — E8889 Other specified metabolic disorders: Secondary | ICD-10-CM | POA: Insufficient documentation

## 2017-11-27 DIAGNOSIS — R05 Cough: Secondary | ICD-10-CM | POA: Diagnosis not present

## 2017-11-27 DIAGNOSIS — N938 Other specified abnormal uterine and vaginal bleeding: Secondary | ICD-10-CM | POA: Insufficient documentation

## 2017-11-27 DIAGNOSIS — R197 Diarrhea, unspecified: Secondary | ICD-10-CM

## 2017-11-27 DIAGNOSIS — Z9114 Patient's other noncompliance with medication regimen: Secondary | ICD-10-CM | POA: Diagnosis not present

## 2017-11-27 DIAGNOSIS — I350 Nonrheumatic aortic (valve) stenosis: Secondary | ICD-10-CM | POA: Insufficient documentation

## 2017-11-27 DIAGNOSIS — Z885 Allergy status to narcotic agent status: Secondary | ICD-10-CM

## 2017-11-27 DIAGNOSIS — Z8673 Personal history of transient ischemic attack (TIA), and cerebral infarction without residual deficits: Secondary | ICD-10-CM | POA: Insufficient documentation

## 2017-11-27 DIAGNOSIS — R5383 Other fatigue: Secondary | ICD-10-CM

## 2017-11-27 DIAGNOSIS — N2581 Secondary hyperparathyroidism of renal origin: Secondary | ICD-10-CM | POA: Diagnosis not present

## 2017-11-27 DIAGNOSIS — I700 Atherosclerosis of aorta: Secondary | ICD-10-CM | POA: Diagnosis not present

## 2017-11-27 DIAGNOSIS — Z8249 Family history of ischemic heart disease and other diseases of the circulatory system: Secondary | ICD-10-CM | POA: Diagnosis not present

## 2017-11-27 DIAGNOSIS — N186 End stage renal disease: Secondary | ICD-10-CM | POA: Diagnosis not present

## 2017-11-27 DIAGNOSIS — M545 Low back pain: Secondary | ICD-10-CM | POA: Diagnosis not present

## 2017-11-27 DIAGNOSIS — I10 Essential (primary) hypertension: Secondary | ICD-10-CM | POA: Diagnosis present

## 2017-11-27 HISTORY — DX: Dependence on renal dialysis: Z99.2

## 2017-11-27 HISTORY — DX: Dependence on renal dialysis: N18.6

## 2017-11-27 LAB — HEPATIC FUNCTION PANEL
ALBUMIN: 3.3 g/dL — AB (ref 3.5–5.0)
ALT: 9 U/L — ABNORMAL LOW (ref 14–54)
AST: 13 U/L — ABNORMAL LOW (ref 15–41)
Alkaline Phosphatase: 49 U/L (ref 38–126)
BILIRUBIN DIRECT: 0.2 mg/dL (ref 0.1–0.5)
BILIRUBIN TOTAL: 0.8 mg/dL (ref 0.3–1.2)
Indirect Bilirubin: 0.6 mg/dL (ref 0.3–0.9)
Total Protein: 6.9 g/dL (ref 6.5–8.1)

## 2017-11-27 LAB — CBC WITH DIFFERENTIAL/PLATELET
ABS IMMATURE GRANULOCYTES: 0.1 10*3/uL (ref 0.0–0.1)
BASOS ABS: 0 10*3/uL (ref 0.0–0.1)
Basophils Relative: 0 %
Eosinophils Absolute: 0 10*3/uL (ref 0.0–0.7)
Eosinophils Relative: 0 %
HCT: 35.5 % — ABNORMAL LOW (ref 36.0–46.0)
HEMOGLOBIN: 11.1 g/dL — AB (ref 12.0–15.0)
IMMATURE GRANULOCYTES: 1 %
LYMPHS PCT: 15 %
Lymphs Abs: 0.9 10*3/uL (ref 0.7–4.0)
MCH: 30.8 pg (ref 26.0–34.0)
MCHC: 31.3 g/dL (ref 30.0–36.0)
MCV: 98.6 fL (ref 78.0–100.0)
MONO ABS: 0.4 10*3/uL (ref 0.1–1.0)
Monocytes Relative: 7 %
NEUTROS ABS: 4.4 10*3/uL (ref 1.7–7.7)
Neutrophils Relative %: 77 %
Platelets: 146 10*3/uL — ABNORMAL LOW (ref 150–400)
RBC: 3.6 MIL/uL — AB (ref 3.87–5.11)
RDW: 15.4 % (ref 11.5–15.5)
WBC: 5.7 10*3/uL (ref 4.0–10.5)

## 2017-11-27 LAB — LIPASE, BLOOD: LIPASE: 37 U/L (ref 11–51)

## 2017-11-27 LAB — BASIC METABOLIC PANEL
Anion gap: 16 — ABNORMAL HIGH (ref 5–15)
BUN: 41 mg/dL — ABNORMAL HIGH (ref 6–20)
CHLORIDE: 93 mmol/L — AB (ref 101–111)
CO2: 25 mmol/L (ref 22–32)
CREATININE: 8.03 mg/dL — AB (ref 0.44–1.00)
Calcium: 9.5 mg/dL (ref 8.9–10.3)
GFR calc non Af Amer: 4 mL/min — ABNORMAL LOW (ref >60)
GFR, EST AFRICAN AMERICAN: 5 mL/min — AB (ref >60)
Glucose, Bld: 96 mg/dL (ref 65–99)
Potassium: 4.6 mmol/L (ref 3.5–5.1)
Sodium: 134 mmol/L — ABNORMAL LOW (ref 135–145)

## 2017-11-27 LAB — I-STAT TROPONIN, ED: Troponin i, poc: 0.02 ng/mL (ref 0.00–0.08)

## 2017-11-27 LAB — GLUCOSE, CAPILLARY: Glucose-Capillary: 132 mg/dL — ABNORMAL HIGH (ref 65–99)

## 2017-11-27 LAB — TROPONIN I
Troponin I: <0.03 ng/mL (ref <0.03)
Troponin I: <0.03 ng/mL (ref <0.03)

## 2017-11-27 MED ORDER — AMLODIPINE BESYLATE 5 MG PO TABS: 5.0000 mg | ORAL_TABLET | ORAL | Status: DC

## 2017-11-27 MED ORDER — FERRIC CITRATE 1 GM 210 MG(FE) PO TABS
420.0000 mg | ORAL_TABLET | Freq: Three times a day (TID) | ORAL | Status: DC
  Administered 2017-11-28 (×2): 420 mg via ORAL
  Filled 2017-11-27 (×5): qty 2

## 2017-11-27 MED ORDER — CINACALCET HCL 30 MG PO TABS: 60.0000 mg | ORAL_TABLET | Freq: Every day | ORAL | Status: DC

## 2017-11-27 MED ORDER — BOOST / RESOURCE BREEZE PO LIQD CUSTOM: 1.0000 | Freq: Three times a day (TID) | ORAL | Status: DC

## 2017-11-27 MED ORDER — POLYETHYLENE GLYCOL 3350 17 G PO PACK: 17.0000 g | PACK | Freq: Every day | ORAL | Status: DC

## 2017-11-27 MED ORDER — SODIUM CHLORIDE 0.9% FLUSH
3.0000 mL | Freq: Two times a day (BID) | INTRAVENOUS | Status: DC
  Administered 2017-11-27: 3 mL via INTRAVENOUS

## 2017-11-27 MED ORDER — FERRIC CITRATE 1 GM 210 MG(FE) PO TABS: 210.0000 mg | ORAL_TABLET | Freq: Every day | ORAL | Status: DC

## 2017-11-27 MED ORDER — NITROGLYCERIN 0.4 MG SL SUBL
0.4000 mg | SUBLINGUAL_TABLET | SUBLINGUAL | Status: AC | PRN
  Administered 2017-11-27 (×3): 0.4 mg via SUBLINGUAL
  Filled 2017-11-27: qty 1

## 2017-11-27 MED ORDER — ACETAMINOPHEN 650 MG RE SUPP: 650.0000 mg | Freq: Four times a day (QID) | RECTAL | Status: DC | PRN

## 2017-11-27 MED ORDER — PROMETHAZINE HCL 25 MG/ML IJ SOLN
12.5000 mg | Freq: Once | INTRAMUSCULAR | Status: AC
  Administered 2017-11-27: 12.5 mg via INTRAVENOUS
  Filled 2017-11-27: qty 1

## 2017-11-27 MED ORDER — ACETAMINOPHEN 325 MG PO TABS: 650.0000 mg | ORAL_TABLET | Freq: Four times a day (QID) | ORAL | Status: DC | PRN

## 2017-11-27 MED ORDER — ASPIRIN 81 MG PO TBEC: 81.0000 mg | DELAYED_RELEASE_TABLET | Freq: Every day | ORAL | Status: DC

## 2017-11-27 MED ORDER — CALCIUM ACETATE (PHOS BINDER) 667 MG PO CAPS
1334.0000 mg | ORAL_CAPSULE | Freq: Three times a day (TID) | ORAL | Status: DC
  Filled 2017-11-27: qty 2

## 2017-11-27 MED ORDER — ASPIRIN 81 MG PO CHEW
324.0000 mg | CHEWABLE_TABLET | Freq: Once | ORAL | Status: AC
  Administered 2017-11-27: 324 mg via ORAL
  Filled 2017-11-27: qty 4

## 2017-11-27 MED ORDER — ENSURE ENLIVE PO LIQD
237.0000 mL | Freq: Two times a day (BID) | ORAL | Status: DC
  Administered 2017-11-28: 237 mL via ORAL

## 2017-11-27 MED ORDER — SENNOSIDES-DOCUSATE SODIUM 8.6-50 MG PO TABS: 1.0000 | ORAL_TABLET | Freq: Every evening | ORAL | Status: DC | PRN

## 2017-11-27 MED ORDER — HEPARIN SODIUM (PORCINE) 5000 UNIT/ML IJ SOLN
5000.0000 [IU] | Freq: Three times a day (TID) | INTRAMUSCULAR | Status: DC
  Administered 2017-11-27 – 2017-11-28 (×3): 5000 [IU] via SUBCUTANEOUS
  Filled 2017-11-27 (×4): qty 1

## 2017-11-27 MED ORDER — PROMETHAZINE HCL 25 MG PO TABS
12.5000 mg | ORAL_TABLET | Freq: Four times a day (QID) | ORAL | Status: DC | PRN
  Administered 2017-11-28: 12.5 mg via ORAL
  Filled 2017-11-27 (×2): qty 1

## 2017-11-27 MED ORDER — CALCITRIOL 0.25 MCG PO CAPS: 0.2500 ug | ORAL_CAPSULE | Freq: Every day | ORAL | Status: DC

## 2017-11-27 MED ORDER — CLONIDINE HCL 0.2 MG/24HR TD PTWK
0.2000 mg | MEDICATED_PATCH | TRANSDERMAL | Status: DC
  Administered 2017-11-27: 0.2 mg via TRANSDERMAL
  Filled 2017-11-27: qty 1

## 2017-11-27 MED ORDER — ONDANSETRON HCL 4 MG/2ML IJ SOLN
4.0000 mg | Freq: Once | INTRAMUSCULAR | Status: AC
  Administered 2017-11-27: 4 mg via INTRAVENOUS
  Filled 2017-11-27: qty 2

## 2017-11-27 MED ORDER — ONDANSETRON HCL 4 MG/2ML IJ SOLN
4.0000 mg | Freq: Three times a day (TID) | INTRAMUSCULAR | Status: DC | PRN
  Administered 2017-11-27 (×2): 4 mg via INTRAVENOUS
  Filled 2017-11-27 (×2): qty 2

## 2017-11-27 NOTE — ED Notes (Signed)
Pt called out and sts "i'm sweating and my hands and feet are cold." Pt not observed to be diaphoretic. Pt given wash cloth per request. Pt also sts "I don't feel right, my chest is hurting."  New EKG completed and given to Dr. Roxanne Mins.

## 2017-11-27 NOTE — Progress Notes (Addendum)
Pt off unit to dialysis. P. Amo Brayson Livesey RN 

## 2017-11-27 NOTE — Discharge Summary (Addendum)
Name: Nancy [name] MRN: [id number] DOB: 1941/01/09 77 y.o. PCP: Aldine Contes, MD  Date of Admission: 11/27/2017  4:36 AM Date of Discharge: 11/28/2017 Attending Physician: Axel Filler, *  Discharge Diagnosis: 1. Symptomatic uremia 2. ESRD 3. HTN  Principal Problem:   Nausea & vomiting Active Problems:   Essential hypertension   Non-intractable vomiting with nausea   End stage renal disease ([place])   Discharge Medications: Allergies as of 11/28/2017      Reactions   Hydrocodone-acetaminophen Nausea And Vomiting, Other (See Comments)   Dizziness (also)      Medication List    TAKE these medications   acetaminophen 500 MG tablet Commonly known as:  TYLENOL Take 2 tablets (1,000 mg total) by mouth every 8 (eight) hours as needed for mild pain, moderate pain, fever or headache.   amLODipine 10 MG tablet Commonly known as:  NORVASC Take 1/2 tablet by mouth according to prior schedule   aspirin 81 MG EC tablet Take 1 tablet (81 mg total) by mouth daily.   calcitRIOL 0.25 MCG capsule Commonly known as:  ROCALTROL Take 1 capsule (0.25 mcg total) by mouth daily.   calcium acetate 667 MG capsule Commonly known as:  PHOSLO Take 2 capsules (1,334 mg total) by mouth 3 (three) times daily with meals.   cloNIDine 0.2 mg/24hr patch Commonly known as:  CATAPRES - Dosed in mg/24 hr Place 1 patch (0.2 mg total) onto the skin every 7 (seven) days.   lidocaine-prilocaine cream Commonly known as:  EMLA Apply 1 application topically 3 (three) times a week. APPLY SMALL AMOUNT TO ACCESS SITE (AVF) 3 TIMES A WEEK 1 HOUR BEFORE DIALYSIS. COVER WITH OCCLUSIVE DRESSING (SARAN WRAP)   nitroGLYCERIN 0.3 MG SL tablet Commonly known as:  NITROSTAT Place 1 tablet (0.3 mg total) under the tongue every 5 (five) minutes as needed for chest pain.   polyethylene glycol packet Commonly known as:  MIRALAX / GLYCOLAX Take 17 g by mouth daily.   promethazine 12.5 MG  tablet Commonly known as:  PHENERGAN Take 1 tablet (12.5 mg total) by mouth every 6 (six) hours as needed for up to 2 days for nausea or vomiting.   senna-docusate 8.6-50 MG tablet Commonly known as:  Senokot-S Take 1 tablet by mouth at bedtime as needed for mild constipation or moderate constipation.   SENSIPAR 60 MG tablet Generic drug:  cinacalcet Take 1 tablet (60 mg total) by mouth daily.       Disposition and follow-up:   [name] was discharged from [facility] in Stable condition.  At the hospital follow up visit please address:  1.  Please encourage continued adherence to HD. Assess volume status.  2.  Labs / imaging needed at time of follow-up: None  3.  Pending labs/ test needing follow-up: None  Follow-up Appointments: Follow-up Information    Aldine Contes, MD. Go on 12/04/2017.   Specialty:  Internal Medicine Why:  at 10:15am Contact information: [place], [address] [place] [address] [facility] Course by problem list: Principal Problem:   Nausea & vomiting Active Problems:   Essential hypertension   Non-intractable vomiting with nausea   End stage renal disease ([place])   1. Symptomatic uremia Patient admitted with abdominal pain, nausea and vomiting. She missed her dialysis session the day prior to admission. BUN 41 on admission. Labs otherwise without evidence of infection or ischemia. She  has recently had several similar episodes after dialysis. There was some thought that it may be related to hypotension after HD and/or decreased perfusion of her stomach causing abdominal pain and nausea after HD. Her symptoms were felt to be most likely related to symptomatic uremia, in a patient who is particularly sensitive to any missed sessions of dialysis. During her admission, she was managed with antiemetics and pain medications and received serial HD 5/23 and 5/24 with resolution of her symptoms.  She was counseled on the importance of attending all of her dialysis sessions and was able to tolerate PO intake prior to discharge.  2. ESRD on HD MWF She missed dialysis the day prior to her admission due to not feeling well. Nephrology was consulted during her admission and she received dialysis on 5/23 and 5/24. She was discharged with plan to continue typical outpatient dialysis schedule. EDW on discharge was 58.5kg.  3. Atypical chest pain Atypical chest pain associated with coughing and emesis. EKG and troponins were reassuring. Likely secondary to MSK pain from emesis. Chest pain resolved prior to discharge.  4. HTN Patient admitted with elevated blood pressures with sBP in 190s-200s after missing her dialysis session the day prior to admission. Granddaughter also informed us that she had run out of her clonidine patch. Home clonidine patch was continued and she received dialysis with improvement in her BP with sBP in 130s-140s on discharge. Refill of clonidine patch was sent to her pharmacy.  Discharge Vitals:   BP (!) 136/54 (BP Location: Right Arm)   Pulse 99   Temp 98 F (36.7 C) (Oral)   Resp 16   Ht 5\' 3"  (1.6 m)   Wt 128 lb 15.5 oz (58.5 kg)   SpO2 99%   BMI 22.85 kg/m   Pertinent Labs, Studies, and Procedures:  CBC Latest Ref Rng & Units 11/28/2017 11/28/2017 11/27/2017  WBC 4.0 - 10.5 K/uL 6.4 7.8 5.7  Hemoglobin 12.0 - 15.0 g/dL 9.4(L) 10.3(L) 11.1(L)  Hematocrit 36.0 - 46.0 % 29.9(L) 32.6(L) 35.5(L)  Platelets 150 - 400 K/uL 162 153 146(L)   CMP Latest Ref Rng & Units 11/28/2017 11/28/2017 11/27/2017  Glucose 65 - 99 mg/dL 93 103(H) 96  BUN 6 - 20 mg/dL 31(H) 27(H) 41(H)  Creatinine 0.44 - 1.00 mg/dL 7.09(H) 6.62(H) 8.03(H)  Sodium 135 - 145 mmol/L 132(L) 135 134(L)  Potassium 3.5 - 5.1 mmol/L 4.6 4.4 4.6  Chloride 101 - 111 mmol/L 93(L) 93(L) 93(L)  CO2 22 - 32 mmol/L 26 27 25   Calcium 8.9 - 10.3 mg/dL 9.2 8.8(L) 9.5  Total Protein 6.5 - 8.1 g/dL - - 6.9  Total  Bilirubin 0.3 - 1.2 mg/dL - - 0.8  Alkaline Phos 38 - 126 U/L - - 49  AST 15 - 41 U/L - - 13(L)  ALT 14 - 54 U/L - - 9(L)   Troponin 0.02 -> <0.03 -> <0.03 Lipase 37  CXR 11/27/2017 No acute cardiopulmonary process.  Discharge Instructions: Discharge Instructions    Diet - low sodium heart healthy   Complete by:  As directed    Discharge instructions   Complete by:  As directed    [name],  It was nice to meet you. While you were here, we treated you for abdominal pain and nausea/vomiting. We are not sure exactly what causes you to have repeating episodes of abdominal pain, but we think it was probably from missing dialysis, your blood pressure being too high, and having too much of the  toxins in your body that are normally taken out by dialysis. It is very important for you to go to every session of dialysis and to follow up with your primary doctor.  Please continue taking all your home medicines.   Increase activity slowly   Complete by:  As directed      Signed: Colbert Ewing, MD 11/28/2017, 8:17 PM   Pager: Mamie Nick [phone]

## 2017-11-27 NOTE — ED Triage Notes (Signed)
Pt BIB EMS from home. Pt reports she was woken up by sudden onset on CP accompanied by N/V and mild SOB. Pain described as heaviness to the left and middle chest, non-radiating. Missed her dialysis appt on Wed AM d/t "feeling bad". Has had 324mg  ASA and 1 nitrostat.

## 2017-11-27 NOTE — ED Notes (Signed)
IV team consulted, IV teams IV infiltrated

## 2017-11-27 NOTE — ED Notes (Signed)
Patient vomiting in emesis bag when staff entered room.  Family in patient room stated that she noted "bile in emesis bag that was yellow in color".

## 2017-11-27 NOTE — Progress Notes (Signed)
Pt admitted to the unit from [facility] ED; pt alert and verbally responsive; ambulated to the BR x1 assist; VSS; telemetry applied and verified with CCMD; NT called to second verify; DNR armband applied; pt oriented to the unit; fall/safety precaution and prevention education completed with pt; bed alarm on; call light within reach. Pt denies any chest pain; continue to have N/V. Will closely monitor. Delia Heady RN

## 2017-11-27 NOTE — ED Notes (Signed)
IV attempt unsuccessful x 2

## 2017-11-27 NOTE — H&P (Addendum)
Date: 11/27/2017               Patient Name:  Nancy [name] MRN: [id number]  DOB: [birth year] Age / Sex: 77 y.o., female   PCP: Aldine Contes, MD         Medical Service: Internal Medicine Teaching Service         Attending Physician: Dr. Evette Doffing, Mallie Mussel, *    First Contact: Dr. Ronalee Red Pager: [phone]  Second Contact: Dr. Philipp Ovens Pager: [phone]       After Hours (After 5p/  First Contact Pager: [phone]  weekends / holidays): Second Contact Pager: [phone]   Chief Complaint: Vomiting  History of Present Illness: This is a 77 y.o. woman with PMHx of ESRD on HD (MWF), aortic stenosis, HTN, hx CVA who presents to the ED with vomiting.  She was recently admitted to the hospital 4/25-4/30 with uremic encephalopathy and intractable nausea and vomiting.  Her symptoms improved with antiemetics and receiving her dialysis sessions.  She was seen in clinic for follow up on 5/2 and was having similar symptoms thought possibly to be due to hypotension after HD and/or decreased perfusion of her stomach causing abdominal pain and nausea after HD.  She was instructed to follow up with nephrology at her HD center to see if there were changes that could be made during her sessions.  She also was encouraged to pick up her phenergan from pharmacy that had yet to be obtained as well as stool softeners.  She presents this morning with recurrence of her emesis since Tuesday.  She reports about 7 to 8 episodes of emesis since that time that occurs at random and is not associated with anything that she can tell.  During the episodes, she notices chest pain that is pressure like and back pain that then resolves..  She currently denies any pain.  Symptoms are better with not eating or drinking and she cannot elicit anything else that relieves her symptoms.  She did not pick up the Phenergan from her pharmacy.  Her emesis is non-bloody.  She also endorses nausea, chronic dry cough present since Winter, an  episode of dizziness getting up to go to the bathroom, shortness of breath, and diarrhea.  The diarrhea occurred on Tuesday forcing her to miss HD on Wednesday but has since resolved.  She denies any abdominal pain, lower extremity swelling, sick contacts, new dietary items.  ED Course: She was hypertensive 150s-180s, afebrile, tachycardic to the low 100s, and saturating in the upper 90's on room air.  Labs and imaging as below.  She was given aspirin, nitroglycerin, and zofran.  IMTS called to admit patient for her nausea, vomiting and chest pain.  At the time of my evaluation she was chest pain free.  LABS: Na 134, K 4.6, Cl 93, CO2 25, Glucose 96, BUN 41, Cr 8.03, anion gap 16 WBC 5.7, Hgb 11, HCT 35, platelets 146 Troponin 0.02   Meds:  Current Meds  Medication Sig  . acetaminophen (TYLENOL) 500 MG tablet Take 2 tablets (1,000 mg total) by mouth every 8 (eight) hours as needed for mild pain, moderate pain, fever or headache.  . calcium acetate (PHOSLO) 667 MG capsule Take 2 capsules (1,334 mg total) by mouth 3 (three) times daily with meals.  . cloNIDine (CATAPRES - DOSED IN MG/24 HR) 0.2 mg/24hr patch Place 1 patch (0.2 mg total) onto the skin every 7 (seven) days.  [place] Kitchen lidocaine-prilocaine (EMLA) cream Apply 1 application  topically 3 (three) times a week. APPLY SMALL AMOUNT TO ACCESS SITE (AVF) 3 TIMES A WEEK 1 HOUR BEFORE DIALYSIS. COVER WITH OCCLUSIVE DRESSING (SARAN WRAP)  . nitroGLYCERIN (NITROSTAT) 0.3 MG SL tablet Place 1 tablet (0.3 mg total) under the tongue every 5 (five) minutes as needed for chest pain.     Allergies: Allergies as of 11/27/2017 - Review Complete 11/27/2017  Allergen Reaction Noted  . Hydrocodone-acetaminophen Nausea And Vomiting and Other (See Comments)    Past Medical History:  Diagnosis Date  . Anemia   . Aortic stenosis   . Bacterial sinusitis 09/17/2011  . CHF (congestive heart failure) ([place])   . CKD (chronic kidney disease) stage 4, GFR 15-29  ml/min ([place]) 08/11/2006   Cr continues to increase. Proteinuria on UA 02/10/12.    . Colitis   . CVA [facility]bro[facility] accident) [facility])    New hemorrhagic per CT scan '09  . Diverticulosis of colon   . Dysfunctional uterine bleeding   . Fecal impaction ([place])   . Headache(784.0)   . Heart murmur   . HERNIORRHAPHY, HX OF 08/11/2006  . Hypertension   . OA (osteoarthritis)    bilateral knees  . Postmenopausal   . Pulmonary nodule   . TINEA CRURIS 01/12/2007    Family History:  Family History  Problem Relation Age of Onset  . Hypertension Mother   . Congestive Heart Failure Mother   . Heart attack Brother 98     Social History:  Social History   Tobacco Use  . Smoking status: Never Smoker  . Smokeless tobacco: Never Used  Substance Use Topics  . Alcohol use: No    Alcohol/week: 0.0 oz  . Drug use: No    Comment: 08/15/08 UDS + cocaine    Review of Systems: A complete ROS was negative except as per HPI.   Physical Exam: Blood pressure (!) 165/85, pulse (!) 103, temperature 98.5 F (36.9 C), temperature source Oral, resp. rate 17, height 5\' 3"  (1.6 m), weight 126 lb (57.2 kg), SpO2 99 %. Physical Exam  Constitutional: She is oriented to person, place, and time and well-developed, well-nourished, and in no distress. No distress.  HENT:  Head: Normocephalic and atraumatic.  Mouth/Throat: Oropharynx is clear and moist. No oropharyngeal exudate.  Eyes: Conjunctivae and EOM are normal. No scleral icterus.  Neck: Normal range of motion. Neck supple.  Cardiovascular:  Murmur heard. Heart rate is mildly tachycardic and regular. She has a previously documented 3/6 systolic murmur   Pulmonary/Chest: Effort normal and breath sounds normal. She has no wheezes. She has no rales.  Abdominal: Soft. Bowel sounds are normal. There is no tenderness. There is no rebound and no guarding.  Musculoskeletal: She exhibits edema.  Trace lower extremity edema.   Neurological: She is alert and  oriented to person, place, and time. No cranial nerve deficit.  Skin: Skin is warm and dry. She is not diaphoretic.  Psychiatric: Mood and affect normal.     EKG: personally reviewed my interpretation is sinus tachycardia, LVH, old anterior infarct.    CXR: personally reviewed my interpretation is no effusion or consolidation.  No acute process.  Assessment & Plan by Problem: Principal Problem:   Nausea & vomiting Active Problems:   Essential hypertension   Non-intractable vomiting with nausea   End stage renal disease ([facility])  Nausea and Vomiting She returns to the ED with nausea and vomiting since Tuesday.  She was given Zofran in the ED without documented improvement.  On  my evaluation, she was resting comfortably in bed with an emesis bag next to her.  She was not having symptoms at that time.  She subsequently has been given Phenergan so we can reassess her response to that.  Her basic metabolic panel is consistent with ESRD but otherwise not remarkable for acidosis or significant BUN increase. - Assess LFTs - Check Lipase although pancreatitis seems less likely given lack of pain. - HD as below - Phenergan PRN - Continue outpatient bowel regimen  Chest Pain Her chest pain is atypical in that it is present with vomiting and remits between episodes of emesis.  It also did not improve with NTG.  Her EKG is non-ischemic and first troponin negative.  Currently chest pain free on my evaluation. - Cycle troponins - EKG PRN chest pain - Treat nausea and vomiting as above  ESRD on HD  Receives HD on MWF at [facility].  Did not get dialyzed yesterday due to diarrhea that has resolved. - Will contact nephrology for HD services while inpatient.  Currently no acute dialysis need identified  History of HTN Hypertensive in the ED.  Will continue her home clonidine patch.  Reports not taking her amlodipine   FEN Fluids: None Electrolytes: monitor with HD Nutrition:  Clears, advanced as tolerated  DVT PPx: Heparin SQ  CODE: DNR   Dispo: Admit patient to Inpatient with expected length of stay greater than 2 midnights.  SignedJule Ser, DO 11/27/2017, 12:08 PM  Pager: [phone]

## 2017-11-27 NOTE — Progress Notes (Signed)
Paged regarding patient complaining of fast heart rate and feeling like she was going to die. Upon entering room, patient was lying in bed in no acute distress. She described that her heart was racing and that she had some mild chest tightness above her left breast that is worse with coughing and vomiting. She was observed to have clear emesis x1 while in the room. EKG was obtained, which shows slightly more prominent T wave inversions in the inferior leads compared to prior in April 2019 and unchanged compared to EKG from this morning. Troponin negative. Exam notable for tachycardia and regular rhythm. 3/6 systolic murmur consistent with patient's known moderate aortic stenosis. Lungs are clear and she has trace LE edema. Unclear etiology for her recurrent episodes of abdominal pain, N/V after dialysis. Based on previous notes, patient is noted to be borderline tachycardic at baseline. Will continue to monitor - IV zofran 4mg  q8h PRN for N/V - Continue trending troponins - HD today per Nephrology

## 2017-11-27 NOTE — ED Triage Notes (Signed)
TC to [employer]

## 2017-11-27 NOTE — ED Triage Notes (Signed)
TC from MD. Reported pt complaint of Lt arm pain and heart feeling funny.

## 2017-11-27 NOTE — ED Triage Notes (Signed)
Pt called to desk to report Lt sided Arm pain and her hart felt funny. Pt then stated I fell like I am going to die. Attempted to call Internal Med.

## 2017-11-27 NOTE — ED Triage Notes (Signed)
PT stated "You are going to have to take it out . Do you want get sued?"

## 2017-11-27 NOTE — Consult Note (Addendum)
[facility] Renal Consultation Note  Indication for Consultation:  Management of ESRD/hemodialysis; anemia, hypertension/volume and secondary hyperparathyroidism  HPI: Nancy [name] is a 77 y.o. female with ESRD presumed 2/2 HTN ( [facility])  HD start 02/2017  Hx CVA, DJD, ? CAD (echo 12/2016 with EF 55-60%, mod aortic stenosis). Uses prn NTG at home but does not carry specific Dx CAD (no prior MI, no known cardiac cath). 4/25-4/30/19  AM/ confusion following missed HD, right knee effusion/drained.     Now admitted N/ V since Tuesday 11/25/17 , atypical chest pain (1st CE neg. EKG nonspec. ) Her last HD was Mon. 5/20  , missed Hd yesterday  secondary to her symptoms . K 4.8  , bun 41 , Scr 8.03  CO2 25  Na 134. Lipase 37.Her CXR  is showing no volume overload . Mildly hypertensive ER ( throwing op meds vs compliance ) She denies constipation or diarrhea or problems at HD center with her AVF . Lives alone at home per pt.    Currently not in distress, co mild abdominal discomfort .      Past Medical History:  Diagnosis Date  . Anemia   . Aortic stenosis   . Bacterial sinusitis 09/17/2011  . CHF (congestive heart failure) ([place])   . CKD (chronic kidney disease) stage 4, GFR 15-29 ml/min ([place]) 08/11/2006   Cr continues to increase. Proteinuria on UA 02/10/12.    . Colitis   . CVA (cerebrovascular accident) [facility])    New hemorrhagic per CT scan '09  . Diverticulosis of colon   . Dysfunctional uterine bleeding   . Fecal impaction ([place])   . Headache(784.0)   . Heart murmur   . HERNIORRHAPHY, HX OF 08/11/2006  . Hypertension   . OA (osteoarthritis)    bilateral knees  . Postmenopausal   . Pulmonary nodule   . TINEA CRURIS 01/12/2007    Past Surgical History:  Procedure Laterality Date  . ABDOMINAL HYSTERECTOMY    . AV FISTULA PLACEMENT Left 02/19/2017   Procedure: CREATION OF LEFT ARM BRACHIOCEPHALIC ARTERIOVENOUS (AV) FISTULA;  Surgeon: Rosetta Posner, MD;  Location: [place];  Service: Vascular;  Laterality: Left;  . BASCILIC VEIN TRANSPOSITION Left 04/23/2017   Procedure: BASILIC VEIN TRANSPOSITION SECOND STAGE;  Surgeon: Rosetta Posner, MD;  Location: [place];  Service: Vascular;  Laterality: Left;  . CHOLECYSTECTOMY  2009  . COLONOSCOPY    . INGUINAL HERNIA REPAIR  2008  . INSERTION OF DIALYSIS CATHETER Right 02/19/2017   Procedure: INSERTION OF TUNNELED DIALYSIS CATHETER - RIGHT INTERNAL JUGULAR PLACEMENT;  Surgeon: Rosetta Posner, MD;  Location: [place];  Service: Vascular;  Laterality: Right;  . IRIDOTOMY / IRIDECTOMY     Laser, right eye 12/26/11 left eye 01/24/12  . MASS EXCISION Left 05/07/2013   Procedure: EXCISION CYST;  Surgeon: Myrtha Mantis., MD;  Location: [place];  Service: Ophthalmology;  Laterality: Left;      Family History  Problem Relation Age of Onset  . Hypertension Mother   . Congestive Heart Failure Mother   . Heart attack Brother 82      reports that she has never smoked. She has never used smokeless tobacco. She reports that she does not drink alcohol or use drugs.   Allergies  Allergen Reactions  . Hydrocodone-Acetaminophen Nausea And Vomiting and Other (See Comments)    Dizziness (also)    Prior to Admission medications  Medication Sig Start Date End Date Taking? Authorizing Provider  acetaminophen (TYLENOL) 500 MG tablet Take 2 tablets (1,000 mg total) by mouth every 8 (eight) hours as needed for mild pain, moderate pain, fever or headache. 07/22/17  Yes Colbert Ewing, MD  calcium acetate (PHOSLO) 667 MG capsule Take 2 capsules (1,334 mg total) by mouth 3 (three) times daily with meals. 02/22/17  Yes Lacroce, Hulen Shouts, MD  cloNIDine (CATAPRES - DOSED IN MG/24 HR) 0.2 mg/24hr patch Place 1 patch (0.2 mg total) onto the skin every 7 (seven) days. 07/09/17  Yes Sid Falcon, MD  lidocaine-prilocaine (EMLA) cream Apply 1 application topically 3 (three) times a week. APPLY SMALL AMOUNT TO ACCESS SITE  (AVF) 3 TIMES A WEEK 1 HOUR BEFORE DIALYSIS. COVER WITH OCCLUSIVE DRESSING (SARAN WRAP) 11/20/17  Yes [provider]  nitroGLYCERIN (NITROSTAT) 0.3 MG SL tablet Place 1 tablet (0.3 mg total) under the tongue every 5 (five) minutes as needed for chest pain. 02/27/17  Yes Sid Falcon, MD  amLODipine (NORVASC) 10 MG tablet Take 1/2 tablet by mouth according to prior schedule Patient not taking: Reported on 11/27/2017 09/20/17   Molt, Bethany, DO  aspirin EC 81 MG EC tablet Take 1 tablet (81 mg total) by mouth daily. Patient not taking: Reported on 11/27/2017 12/10/16   Kalman Shan Ratliff, DO  calcitRIOL (ROCALTROL) 0.25 MCG capsule Take 1 capsule (0.25 mcg total) by mouth daily. Patient not taking: Reported on 11/27/2017 01/15/17   Sid Falcon, MD  polyethylene glycol [facility] / Floria Raveling) packet Take 17 g by mouth daily. Patient not taking: Reported on 11/27/2017 10/28/17   Katherine Roan, MD  promethazine (PHENERGAN) 12.5 MG tablet Take 1 tablet (12.5 mg total) by mouth every 6 (six) hours as needed for up to 2 days for nausea or vomiting. Patient not taking: Reported on 11/27/2017 11/06/17 11/08/17  Katherine Roan, MD  senna-docusate (SENOKOT-S) 8.6-50 MG tablet Take 1 tablet by mouth at bedtime as needed for mild constipation or moderate constipation. Patient not taking: Reported on 11/27/2017 10/28/17 11/27/17  Katherine Roan, MD  SENSIPAR 60 MG tablet Take 1 tablet (60 mg total) by mouth daily. Patient not taking: Reported on 11/27/2017 01/15/17   Sid Falcon, MD     Anti-infectives (From admission, onward)   None      Results for orders placed or performed during the hospital encounter of 11/27/17 (from the past 48 hour(s))  Basic metabolic panel     Status: Abnormal   Collection Time: 11/27/17  5:15 AM  Result Value Ref Range   Sodium 134 (L) 135 - 145 mmol/L   Potassium 4.6 3.5 - 5.1 mmol/L   Chloride 93 (L) 101 - 111 mmol/L   CO2 25 22 - 32 mmol/L   Glucose,  Bld 96 65 - 99 mg/dL   BUN 41 (H) 6 - 20 mg/dL   Creatinine, Ser 8.03 (H) 0.44 - 1.00 mg/dL   Calcium 9.5 8.9 - 10.3 mg/dL   GFR calc non Af Amer 4 (L) >60 mL/min   GFR calc Af Amer 5 (L) >60 mL/min    Comment: (NOTE) The eGFR has been calculated using the CKD EPI equation. This calculation has not been validated in all clinical situations. eGFR's persistently <60 mL/min signify possible Chronic Kidney Disease.    Anion gap 16 (H) 5 - 15    Comment: Performed at [facility], [place] [address] [place],  [address]  CBC with  Differential     Status: Abnormal   Collection Time: 11/27/17  5:15 AM  Result Value Ref Range   WBC 5.7 4.0 - 10.5 K/uL    Comment: QUESTIONABLE RESULTS, RECOMMEND RECOLLECT TO VERIFY SPECIMEN CL[place]ED NOTIFIED Louellen Molder, RN [phone] 11/27/2017 BY MACEDA,J.  PT WILL BE CREDITED FOR CBCD.    RBC 3.60 (L) 3.87 - 5.11 MIL/uL    Comment: CORRECTED ON 05/23 AT 0555: PREVIOUSLY REPORTED AS 3.64   Hemoglobin 11.1 (L) 12.0 - 15.0 g/dL    Comment: QUESTIONABLE RESULTS, RECOMMEND RECOLLECT TO VERIFY CORRECTED ON 05/23 AT 0555: PREVIOUSLY REPORTED AS 11.1    HCT 35.5 (L) 36.0 - 46.0 %    Comment: QUESTIONABLE RESULTS, RECOMMEND RECOLLECT TO VERIFY CORRECTED ON 05/23 AT 0555: PREVIOUSLY REPORTED AS 35.4    MCV 98.6 78.0 - 100.0 fL    Comment: QUESTIONABLE RESULTS, RECOMMEND RECOLLECT TO VERIFY CORRECTED ON 05/23 AT 0555: PREVIOUSLY REPORTED AS 97.3    MCH 30.8 26.0 - 34.0 pg    Comment: QUESTIONABLE RESULTS, RECOMMEND RECOLLECT TO VERIFY CORRECTED ON 05/23 AT 0555: PREVIOUSLY REPORTED AS 30.5    MCHC 31.3 30.0 - 36.0 g/dL    Comment: QUESTIONABLE RESULTS, RECOMMEND RECOLLECT TO VERIFY CORRECTED ON 05/23 AT 0555: PREVIOUSLY REPORTED AS 31.4    RDW 15.4 11.5 - 15.5 %    Comment: QUESTIONABLE RESULTS, RECOMMEND RECOLLECT TO VERIFY CORRECTED ON 05/23 AT 0555: PREVIOUSLY REPORTED AS 15.3    Platelets 146 (L) 150 - 400 K/uL    Comment: QUESTIONABLE RESULTS,  RECOMMEND RECOLLECT TO VERIFY   Neutrophils Relative % 77 %    Comment: QUESTIONABLE RESULTS, RECOMMEND RECOLLECT TO VERIFY   Neutro Abs 4.4 1.7 - 7.7 K/uL    Comment: QUESTIONABLE RESULTS, RECOMMEND RECOLLECT TO VERIFY   Lymphocytes Relative 15 %    Comment: QUESTIONABLE RESULTS, RECOMMEND RECOLLECT TO VERIFY   Lymphs Abs 0.9 0.7 - 4.0 K/uL    Comment: QUESTIONABLE RESULTS, RECOMMEND RECOLLECT TO VERIFY   Monocytes Relative 7 %    Comment: QUESTIONABLE RESULTS, RECOMMEND RECOLLECT TO VERIFY   Monocytes Absolute 0.4 0.1 - 1.0 K/uL    Comment: QUESTIONABLE RESULTS, RECOMMEND RECOLLECT TO VERIFY   Eosinophils Relative 0 %    Comment: QUESTIONABLE RESULTS, RECOMMEND RECOLLECT TO VERIFY   Eosinophils Absolute 0.0 0.0 - 0.7 K/uL    Comment: QUESTIONABLE RESULTS, RECOMMEND RECOLLECT TO VERIFY   Basophils Relative 0 %    Comment: QUESTIONABLE RESULTS, RECOMMEND RECOLLECT TO VERIFY   Basophils Absolute 0.0 0.0 - 0.1 K/uL    Comment: QUESTIONABLE RESULTS, RECOMMEND RECOLLECT TO VERIFY   Immature Granulocytes 1 %    Comment: QUESTIONABLE RESULTS, RECOMMEND RECOLLECT TO VERIFY   Abs Immature Granulocytes 0.1 0.0 - 0.1 K/uL    Comment: QUESTIONABLE RESULTS, RECOMMEND RECOLLECT TO VERIFY Performed at [facility], [place] [address] [place], [place] [address]   I-stat troponin, ED     Status: None   Collection Time: 11/27/17  5:26 AM  Result Value Ref Range   Troponin i, poc 0.02 0.00 - 0.08 ng/mL   Comment 3            Comment: Due to the release kinetics of cTnI, a negative result within the first hours of the onset of symptoms does not rule out myocardial infarction with certainty. If myocardial infarction is still suspected, repeat the test at appropriate intervals.     ROS: see hpi   Physical Exam: Vitals:   11/27/17 0700 11/27/17 1126  BP: (!) 165/85   Pulse: (!) 103   Resp: 17   Temp:    SpO2: 99% 99%     General: alert elderly AAF , NAD  Wd, WN  HEENT: [place], eomi, non  icteric, mm dry Neck: Supple with  RIJ  Iv noted , no jvd Heart: RRR 2/6 sem , no rub or g  Lungs: CTA ,no rales or rhonchi , non labored breathing Abdomen: Bs+ , soft, sightly tender diffusely , nondistended Extremities: trace bipedal edema  Skin: no overt rash  Neuro: alert OX3, moves all extrm , no acute focal deficits  Dialysis Access: pos . Bruit LUA AVF   Dialysis Orders: [facility]: [facility]   on [facility] . EDW 59 kg HD Bath 2k, 2.5 ca  Time 4 Heparin 3000. Access LUA AVF  B    Calcitriol 2 mcg IV/HD    Mircera 75 mcg q 2wks ( last on 5/08)    Assessment/Plan 1. ESRD -  HD   K 4.6  Today  Missed yesterday  2. Nausea / Vomiting = wu per admit  3. Hypertension/volume  - bp 165/85  2 kg below edw per wt / clonidine 0.68m  patch , ( at home also amlodipine 519m q day held sec N/V) / wt loss lower edw at dc ??  4. Anemia  - hgb 11.1  Hold esa (due today  ) fu hgb trend   5. Metabolic bone disease -  Phos 5.3   corec ca 10.5 hold vit d / on Auryxia as binder , Hold sensipar  Sec N/V till Monday hd / was on Aurxyaia  binder as op / and calcium acetate , use aurxyia with ^ ca . 6. HO CVA   DaErnest HaberPA-C [id number]/23/2019, 12:11 PM

## 2017-11-27 NOTE — ED Triage Notes (Signed)
MD at bedside to assess PT. Marland Kitchen Requested med. For Vomiting   Pt unable to tol. PO . Due to vomiting.

## 2017-11-27 NOTE — ED Triage Notes (Signed)
IM MD at bedside. 

## 2017-11-27 NOTE — ED Notes (Signed)
Patient transported to X-ray 

## 2017-11-27 NOTE — ED Provider Notes (Signed)
[place] EMERGENCY DEPARTMENT Provider Note   CSN: [id number] Arrival date & time: 11/27/17  0436     History   Chief Complaint Chief Complaint  Patient presents with  . Chest Pain    HPI Nancy [name] is a 77 y.o. female.  The history is provided by the patient.  Chest Pain    She has history of hypertension, end-stage renal disease on hemodialysis, heart failure, aortic stenosis, stroke and comes in because of chest pain and vomiting since 2 AM.  She states she woke up with a feeling like there was an elephant sitting on her chest associated with nausea and vomiting.  She is vomited multiple times.  She denies dyspnea or diaphoresis.  Nothing makes symptoms better, nothing makes it worse.  Of note, she was supposed to be dialyzed yesterday, but missed her dialysis because she was not feeling well.  Past Medical History:  Diagnosis Date  . Anemia   . Aortic stenosis   . Bacterial sinusitis 09/17/2011  . CHF (congestive heart failure) ([place])   . CKD (chronic kidney disease) stage 4, GFR 15-29 ml/min ([place]) 08/11/2006   Cr continues to increase. Proteinuria on UA 02/10/12.    . Colitis   . CVA (cerebrovascular accident) [facility])    New hemorrhagic per CT scan '09  . Diverticulosis of colon   . Dysfunctional uterine bleeding   . Fecal impaction ([place])   . Headache(784.0)   . Heart murmur   . HERNIORRHAPHY, HX OF 08/11/2006  . Hypertension   . OA (osteoarthritis)    bilateral knees  . Postmenopausal   . Pulmonary nodule   . TINEA CRURIS 01/12/2007    Patient Active Problem List   Diagnosis Date Noted  . Effusion, right knee   . Encephalopathy   . Intractable nausea and vomiting 10/24/2017  . Secondary hyperparathyroidism of renal origin ([place]) 01/15/2017  . Mitral valve annular calcification   . Constipation 10/15/2016  . Angina at rest [facility])   . Goals of care, counseling/discussion   . Metabolic acidosis 40/34/7425  . Aortic stenosis 05/21/2016  .  Anemia associated with chronic renal failure 02/01/2016  . Atherosclerosis of aorta ([place]) 01/11/2015  . End stage renal disease ([place]) 02/04/2013  . Non-intractable vomiting with nausea 08/17/2012  . Glaucoma 03/18/2012  . Health care maintenance 09/17/2011  . Osteoarthrosis involving lower leg 10/31/2008  . History of CVA (cerebrovascular accident) 01/28/2008  . Hyperlipidemia 02/13/2007  . PULMONARY NODULES 01/12/2007  . Left ventricular hypertrophy 09/02/2006  . Essential hypertension 08/11/2006  . GERD 08/11/2006  . Diverticulitis of colon 08/11/2006    Past Surgical History:  Procedure Laterality Date  . ABDOMINAL HYSTERECTOMY    . AV FISTULA PLACEMENT Left 02/19/2017   Procedure: CREATION OF LEFT ARM BRACHIOCEPHALIC ARTERIOVENOUS (AV) FISTULA;  Surgeon: Rosetta Posner, MD;  Location: [place];  Service: Vascular;  Laterality: Left;  . BASCILIC VEIN TRANSPOSITION Left 04/23/2017   Procedure: BASILIC VEIN TRANSPOSITION SECOND STAGE;  Surgeon: Rosetta Posner, MD;  Location: [employer];  Service: Vascular;  Laterality: Left;  . CHOLECYSTECTOMY  2009  . COLONOSCOPY    . INGUINAL HERNIA REPAIR  2008  . INSERTION OF DIALYSIS CATHETER Right 02/19/2017   Procedure: INSERTION OF TUNNELED DIALYSIS CATHETER - RIGHT INTERNAL JUGULAR PLACEMENT;  Surgeon: Rosetta Posner, MD;  Location: [place];  Service: Vascular;  Laterality: Right;  . IRIDOTOMY / IRIDECTOMY     Laser, right eye 12/26/11 left eye 01/24/12  .  MASS EXCISION Left 05/07/2013   Procedure: EXCISION CYST;  Surgeon: Myrtha Mantis., MD;  Location: [place];  Service: Ophthalmology;  Laterality: Left;     OB History   None      Home Medications    Prior to Admission medications   Medication Sig Start Date End Date Taking? Authorizing Provider  acetaminophen (TYLENOL) 500 MG tablet Take 2 tablets (1,000 mg total) by mouth every 8 (eight) hours as needed for mild pain, moderate pain, fever or headache. 07/22/17    Colbert Ewing, MD  amLODipine (NORVASC) 10 MG tablet Take 1/2 tablet by mouth according to prior schedule Patient taking differently: Take 1/2 tablet by mouth on M, W, F only 09/20/17   Molt, Bethany, DO  aspirin EC 81 MG EC tablet Take 1 tablet (81 mg total) by mouth daily. 12/10/16   Valinda Party, DO  calcitRIOL (ROCALTROL) 0.25 MCG capsule Take 1 capsule (0.25 mcg total) by mouth daily. Patient taking differently: Take 0.25 mcg by mouth daily. Takes on M, W, F only 01/15/17   Sid Falcon, MD  calcium acetate (PHOSLO) 667 MG capsule Take 2 capsules (1,334 mg total) by mouth 3 (three) times daily with meals. 02/22/17   Lacroce, Hulen Shouts, MD  cloNIDine (CATAPRES - DOSED IN MG/24 HR) 0.2 mg/24hr patch Place 1 patch (0.2 mg total) onto the skin every 7 (seven) days. 07/09/17   Sid Falcon, MD  ferric citrate (AURYXIA) 1 GM 210 MG(Fe) tablet Take 210 mg by mouth daily.    [provider]  nitroGLYCERIN (NITROSTAT) 0.3 MG SL tablet Place 1 tablet (0.3 mg total) under the tongue every 5 (five) minutes as needed for chest pain. 02/27/17   Sid Falcon, MD  polyethylene glycol (MIRALAX / Floria Raveling) packet Take 17 g by mouth daily. 10/28/17   Katherine Roan, MD  promethazine (PHENERGAN) 12.5 MG tablet Take 1 tablet (12.5 mg total) by mouth every 6 (six) hours as needed for up to 2 days for nausea or vomiting. 11/06/17 11/08/17  Katherine Roan, MD  senna-docusate (SENOKOT-S) 8.6-50 MG tablet Take 1 tablet by mouth at bedtime as needed for mild constipation or moderate constipation. 10/28/17 11/27/17  Katherine Roan, MD  SENSIPAR 60 MG tablet Take 1 tablet (60 mg total) by mouth daily. 01/15/17   Sid Falcon, MD    Family History Family History  Problem Relation Age of Onset  . Hypertension Mother   . Congestive Heart Failure Mother   . Heart attack Brother 75    Social History Social History   Tobacco Use  . Smoking status: Never Smoker  . Smokeless tobacco:  Never Used  Substance Use Topics  . Alcohol use: No    Alcohol/week: 0.0 oz  . Drug use: No    Comment: 08/15/08 UDS + cocaine     Allergies   Hydrocodone-acetaminophen   Review of Systems Review of Systems  Cardiovascular: Positive for chest pain.  All other systems reviewed and are negative.    Physical Exam Updated Vital Signs BP (!) 180/79 (BP Location: Right Arm)   Pulse (!) 101   Temp 98.5 F (36.9 C) (Oral)   Resp 19   Ht 5\' 3"  (1.6 m)   Wt 57.2 kg (126 lb)   SpO2 99%   BMI 22.32 kg/m   Physical Exam  Nursing note and vitals reviewed.  77 year old female, resting comfortably and in no acute distress. Vital signs  are significant for elevated blood pressure. Oxygen saturation is 99%, which is normal. Head is normocephalic and atraumatic. PERRLA, EOMI. Oropharynx is clear. Neck is nontender and supple without adenopathy or JVD. Back is nontender and there is no CVA tenderness. Lungs are clear without rales, wheezes, or rhonchi. Chest is nontender. Heart has regular rate and rhythm with 2/6 high-pitched systolic ejection murmur best heard at the cardiac base. Abdomen is soft, flat, nontender without masses or hepatosplenomegaly and peristalsis is hypoactive. Extremities have no cyanosis or edema, full range of motion is present.  AV fistula present in the left upper arm with thrill present. Skin is warm and dry without rash. Neurologic: Mental status is normal, cranial nerves are intact, there are no motor or sensory deficits.  ED Treatments / Results  Labs (all labs ordered are listed, but only abnormal results are displayed) Labs Reviewed  BASIC METABOLIC PANEL - Abnormal; Notable for the following components:      Result Value   Sodium 134 (*)    Chloride 93 (*)    BUN 41 (*)    Creatinine, Ser 8.03 (*)    GFR calc non Af Amer 4 (*)    GFR calc Af Amer 5 (*)    Anion gap 16 (*)    All other components within normal limits  CBC WITH  DIFFERENTIAL/PLATELET - Abnormal; Notable for the following components:   RBC 3.60 (*)    Hemoglobin 11.1 (*)    HCT 35.5 (*)    Platelets 146 (*)    All other components within normal limits  I-STAT TROPONIN, ED    EKG EKG Interpretation  Date/Time:  Thursday Nov 27 2017 04:58:34 EDT Ventricular Rate:  114 PR Interval:    QRS Duration: 106 QT Interval:  322 QTC Calculation: 444 R Axis:   28 Text Interpretation:  Sinus tachycardia LVH with secondary repolarization abnormality Anterior infarct, old When compared with ECG of 11/04/2017, ST depression in Inferior leads is slightly more prominent Confirmed by Delora Fuel ([id number]) on 11/27/2017 6:35:48 AM Also confirmed by Delora Fuel ([id number]), editor Hattie Perch [phone])  on 11/27/2017 6:49:33 AM   Radiology Dg Chest 2 View  Result Date: 11/27/2017 CLINICAL DATA:  Chest pain and cough tonight. EXAM: CHEST - 2 VIEW COMPARISON:  Chest radiograph October 24, 2017 FINDINGS: Cardiac silhouette is upper limits of normal in size. Tortuous calcified aorta. No pleural effusion or focal consolidation. No pneumothorax. Vascular clips LEFT upper extremity. Osseous structures are nonsuspicious. Surgical clips in the included right abdomen compatible with cholecystectomy. IMPRESSION: No acute cardiopulmonary process. Aortic Atherosclerosis (ICD10-I70.0). Electronically Signed   By: Elon Alas M.D.   On: 11/27/2017 06:01    Procedures Procedures   Medications Ordered in ED Medications  promethazine (PHENERGAN) injection 12.5 mg (0 mg Intravenous Hold 11/27/17 0651)  aspirin chewable tablet 324 mg (324 mg Oral Given 11/27/17 0648)  nitroGLYCERIN (NITROSTAT) SL tablet 0.4 mg (0.4 mg Sublingual Given 11/27/17 0638)  ondansetron (ZOFRAN) injection 4 mg (4 mg Intravenous Given 11/27/17 0525)     Initial Impression / Assessment and Plan / ED Course  I have reviewed the triage vital signs and the nursing notes.  Pertinent labs & imaging results  that were available during my care of the patient were reviewed by me and considered in my medical decision making (see chart for details).  Chest discomfort with nausea and vomiting.  Will need to check ECG as well as troponin.  Old records are reviewed, and she  was admitted to the hospital 6 weeks ago with diverticulitis where she had similar symptoms with nausea and vomiting, but she was not having chest pain then, and is not having any abdominal pain today.  She will be given aspirin and a therapeutic trial of nitroglycerin.  She will be given ondansetron for nausea.  She had no improvement with nitroglycerin.  Nausea was not improved with ondansetron.  She is given IV promethazine.  Troponin is come back normal.  Other labs are unremarkable.  Mild anemia consistent with end-stage renal disease.  Case is discussed with Dr. Philipp Ovens of internal medicine teaching service agrees to admit the patient for serial troponins and for management of intractable nausea and vomiting.  Final Clinical Impressions(s) / ED Diagnoses   Final diagnoses:  Chest pain, unspecified type  Intractable vomiting with nausea, unspecified vomiting type  End-stage renal disease on hemodialysis ([place])  Anemia associated with chronic renal failure    ED Discharge Orders    None       Delora Fuel, MD 74/16/38 0745

## 2017-11-28 ENCOUNTER — Telehealth: Payer: Self-pay

## 2017-11-28 DIAGNOSIS — R0789 Other chest pain: Secondary | ICD-10-CM | POA: Diagnosis not present

## 2017-11-28 DIAGNOSIS — G8929 Other chronic pain: Secondary | ICD-10-CM

## 2017-11-28 DIAGNOSIS — M545 Low back pain: Secondary | ICD-10-CM

## 2017-11-28 DIAGNOSIS — I120 Hypertensive chronic kidney disease with stage 5 chronic kidney disease or end stage renal disease: Secondary | ICD-10-CM | POA: Diagnosis not present

## 2017-11-28 DIAGNOSIS — Z885 Allergy status to narcotic agent status: Secondary | ICD-10-CM | POA: Diagnosis not present

## 2017-11-28 DIAGNOSIS — Z79899 Other long term (current) drug therapy: Secondary | ICD-10-CM | POA: Diagnosis not present

## 2017-11-28 DIAGNOSIS — I132 Hypertensive heart and chronic kidney disease with heart failure and with stage 5 chronic kidney disease, or end stage renal disease: Secondary | ICD-10-CM | POA: Diagnosis not present

## 2017-11-28 DIAGNOSIS — D631 Anemia in chronic kidney disease: Secondary | ICD-10-CM | POA: Diagnosis not present

## 2017-11-28 DIAGNOSIS — N186 End stage renal disease: Secondary | ICD-10-CM | POA: Diagnosis not present

## 2017-11-28 DIAGNOSIS — R112 Nausea with vomiting, unspecified: Secondary | ICD-10-CM | POA: Diagnosis not present

## 2017-11-28 DIAGNOSIS — N2581 Secondary hyperparathyroidism of renal origin: Secondary | ICD-10-CM | POA: Diagnosis not present

## 2017-11-28 DIAGNOSIS — Z992 Dependence on renal dialysis: Secondary | ICD-10-CM | POA: Diagnosis not present

## 2017-11-28 LAB — RENAL FUNCTION PANEL
ANION GAP: 15 (ref 5–15)
Albumin: 3.1 g/dL — ABNORMAL LOW (ref 3.5–5.0)
Albumin: 2.8 g/dL — ABNORMAL LOW (ref 3.5–5.0)
Anion gap: 13 (ref 5–15)
BUN: 27 mg/dL — AB (ref 6–20)
BUN: 31 mg/dL — AB (ref 6–20)
CHLORIDE: 93 mmol/L — AB (ref 101–111)
CO2: 27 mmol/L (ref 22–32)
CO2: 26 mmol/L (ref 22–32)
CREATININE: 7.09 mg/dL — AB (ref 0.44–1.00)
Calcium: 8.8 mg/dL — ABNORMAL LOW (ref 8.9–10.3)
Calcium: 9.2 mg/dL (ref 8.9–10.3)
Chloride: 93 mmol/L — ABNORMAL LOW (ref 101–111)
Creatinine, Ser: 6.62 mg/dL — ABNORMAL HIGH (ref 0.44–1.00)
GFR calc Af Amer: 6 mL/min — ABNORMAL LOW (ref >60)
GFR calc non Af Amer: 5 mL/min — ABNORMAL LOW (ref >60)
GFR, EST AFRICAN AMERICAN: 6 mL/min — AB (ref >60)
GFR, EST NON AFRICAN AMERICAN: 5 mL/min — AB (ref >60)
GLUCOSE: 93 mg/dL (ref 65–99)
Glucose, Bld: 103 mg/dL — ABNORMAL HIGH (ref 65–99)
PHOSPHORUS: 6.3 mg/dL — AB (ref 2.5–4.6)
POTASSIUM: 4.4 mmol/L (ref 3.5–5.1)
Phosphorus: 7.5 mg/dL — ABNORMAL HIGH (ref 2.5–4.6)
Potassium: 4.6 mmol/L (ref 3.5–5.1)
Sodium: 135 mmol/L (ref 135–145)
Sodium: 132 mmol/L — ABNORMAL LOW (ref 135–145)

## 2017-11-28 LAB — CBC
HCT: 32.6 % — ABNORMAL LOW (ref 36.0–46.0)
HCT: 29.9 % — ABNORMAL LOW (ref 36.0–46.0)
HEMOGLOBIN: 10.3 g/dL — AB (ref 12.0–15.0)
Hemoglobin: 9.4 g/dL — ABNORMAL LOW (ref 12.0–15.0)
MCH: 30.6 pg (ref 26.0–34.0)
MCH: 30.7 pg (ref 26.0–34.0)
MCHC: 31.6 g/dL (ref 30.0–36.0)
MCHC: 31.4 g/dL (ref 30.0–36.0)
MCV: 96.7 fL (ref 78.0–100.0)
MCV: 97.7 fL (ref 78.0–100.0)
PLATELETS: 162 10*3/uL (ref 150–400)
Platelets: 153 10*3/uL (ref 150–400)
RBC: 3.37 MIL/uL — AB (ref 3.87–5.11)
RBC: 3.06 MIL/uL — ABNORMAL LOW (ref 3.87–5.11)
RDW: 15.5 % (ref 11.5–15.5)
RDW: 15.4 % (ref 11.5–15.5)
WBC: 7.8 10*3/uL (ref 4.0–10.5)
WBC: 6.4 10*3/uL (ref 4.0–10.5)

## 2017-11-28 LAB — MRSA PCR SCREENING: MRSA BY PCR: NEGATIVE

## 2017-11-28 MED ORDER — HEPARIN SODIUM (PORCINE) 1000 UNIT/ML DIALYSIS: 3000.0000 [IU] | INTRAMUSCULAR | Status: DC | PRN

## 2017-11-28 MED ORDER — HEPARIN SODIUM (PORCINE) 1000 UNIT/ML DIALYSIS: 1000.0000 [IU] | INTRAMUSCULAR | Status: DC | PRN

## 2017-11-28 MED ORDER — ALTEPLASE 2 MG IJ SOLR: 2.0000 mg | Freq: Once | INTRAMUSCULAR | Status: DC | PRN

## 2017-11-28 MED ORDER — SODIUM CHLORIDE 0.9 % IV SOLN: 100.0000 mL | INTRAVENOUS | Status: DC | PRN

## 2017-11-28 MED ORDER — CHLORHEXIDINE GLUCONATE CLOTH 2 % EX PADS: 6.0000 | MEDICATED_PAD | Freq: Every day | CUTANEOUS | Status: DC

## 2017-11-28 MED ORDER — PENTAFLUOROPROP-TETRAFLUOROETH EX AERO: 1.0000 "application " | INHALATION_SPRAY | CUTANEOUS | Status: DC | PRN

## 2017-11-28 MED ORDER — LIDOCAINE-PRILOCAINE 2.5-2.5 % EX CREA
1.0000 "application " | TOPICAL_CREAM | CUTANEOUS | Status: DC | PRN
  Filled 2017-11-28: qty 5

## 2017-11-28 MED ORDER — SENNOSIDES-DOCUSATE SODIUM 8.6-50 MG PO TABS: 1.0000 | ORAL_TABLET | Freq: Every evening | ORAL | 0 refills | Status: DC | PRN

## 2017-11-28 MED ORDER — LIDOCAINE HCL (PF) 1 % IJ SOLN: 5.0000 mL | INTRAMUSCULAR | Status: DC | PRN

## 2017-11-28 MED ORDER — PRO-STAT SUGAR FREE PO LIQD: 30.0000 mL | Freq: Three times a day (TID) | ORAL | Status: DC

## 2017-11-28 MED ORDER — CLONIDINE 0.2 MG/24HR TD PTWK: MEDICATED_PATCH | TRANSDERMAL | 0 refills | Status: DC

## 2017-11-28 NOTE — Progress Notes (Signed)
Initial Nutrition Assessment  DOCUMENTATION CODES:   Not applicable  INTERVENTION:   30 ml Prostat TID, each supplement provides 100 kcals and 15 grams protein.   NUTRITION DIAGNOSIS:   Increased nutrient needs related to chronic illness(ESRD on HD) as evidenced by estimated needs.  GOAL:   Patient will meet greater than or equal to 90% of their needs  MONITOR:   PO intake, Supplement acceptance, Labs, Weight trends, I & O's  REASON FOR ASSESSMENT:   Malnutrition Screening Tool    ASSESSMENT:   Patient with PMH significant for ESRD on HD, aortic stenosis, HTN, and CVA. Recently admitted 4/25-4/30 for uremic encephalopathy and intractable nausea/vomting. Presents this admission with chest pain and recurring nausea/vomiting.    Spoke with pt at bedside. Reports appetite has remained decreased since being discharged in April due to ongoing nausea (prescribed anti-nausea medication and hasn't taken it). States she typically eats one meal each day that consist of eggs, grits, bacon, and toast. Pt does not use supplementation at home or at outpatient HD center. She is currently on a CLD, and consumed 50% of her last meal.  Denies any nausea/vomiting today. Discussed the importance of protein intake for preservation of lean body mass. Pt amendable to Pro-stat this stay.   Pt unable to provide her dry weight. Nephrology notes EDW is at 59 kg. Records indicate weights have fluctuated from 125-135 lb over the last year. Currently weight (57.1 kg) is lower than EDW, nephrology notes pt is a little dry. Plan fo HD treatment today.   Nutrition-Focused physical exam completed.   Medications reviewed and include: ferric citrate Labs reviewed: Phosphorus 6.3 (H)   NUTRITION - FOCUSED PHYSICAL EXAM:    Most Recent Value  Orbital Region  No depletion  Upper Arm Region  No depletion  Thoracic and Lumbar Region  Unable to assess  Buccal Region  No depletion  Temple Region  Mild depletion   Clavicle Bone Region  Mild depletion  Clavicle and Acromion Bone Region  Moderate depletion  Scapular Bone Region  Unable to assess  Dorsal Hand  No depletion  Patellar Region  No depletion  Anterior Thigh Region  No depletion  Posterior Calf Region  No depletion  Edema (RD Assessment)  None  Hair  Reviewed  Eyes  Reviewed  Mouth  Reviewed  Skin  Reviewed  Nails  Reviewed     Diet Order:   Diet Order           Diet clear liquid Room service appropriate? Yes; Fluid consistency: Thin  Diet effective now          EDUCATION NEEDS:   Education needs have been addressed  Skin:  Skin Assessment: Reviewed RN Assessment  Last BM:  11/25/17  Height:   Ht Readings from Last 1 Encounters:  11/27/17 5\' 3"  (1.6 m)    Weight:   Wt Readings from Last 1 Encounters:  11/28/17 125 lb 14.1 oz (57.1 kg)    Ideal Body Weight:  52.2 kg  BMI:  Body mass index is 22.3 kg/m.  Estimated Nutritional Needs:   Kcal:  1750-1950 kcal  Protein:  85-95 g  Fluid:  1000 ml + UOP    Mariana Single RD, LDN Clinical Nutrition Pager # [phone]

## 2017-11-28 NOTE — Progress Notes (Signed)
Back from dialysis to the unit. Alert and oriented.

## 2017-11-28 NOTE — Care Management CC44 (Signed)
Condition Code 44 Documentation Completed  Patient Details  Name: Nancy [name] MRN: [id number] Date of Birth: [birth year]   Condition Code 44 given:  Yes Patient signature on Condition Code 44 notice:  Yes Documentation of 2 MD's agreement:  Yes Code 44 added to claim:  Yes    Pollie Friar, RN 11/28/2017, 12:33 PM

## 2017-11-28 NOTE — Progress Notes (Signed)
Patient being discharged home. Patient has been provided discharge information and verbalized understanding. Patient states that her granddaughter will be late picking her up; because she has to pick up her children. Night shift nurse has been made aware.

## 2017-11-28 NOTE — Progress Notes (Signed)
Pt was discharged today to go home, waiting for her ride, her grand-daughter came up to pick patient, patient taken down in a wheelchair at 2030, instructions was already given by day shift RN, pt and family reassured. Obasogie-Asidi, Malone Admire Efe

## 2017-11-28 NOTE — Progress Notes (Signed)
[facility] Progress Note   Subjective:   Feeling a little better, able to tolerate diet this AM.   Objective Vitals:   11/28/17 0330 11/28/17 0350 11/28/17 0427 11/28/17 0806  BP: (!) 175/72 (!) 167/81 (!) 184/83 (!) 144/72  Pulse: (!) 110 (!) 109 (!) 108 99  Resp: 17 18 18 16   Temp:  97.9 F (36.6 C) 98.4 F (36.9 C) 98.1 F (36.7 C)  TempSrc:  Oral Oral Oral  SpO2:  99% 100% 100%  Weight:  56.8 kg (125 lb 3.5 oz) 57.1 kg (125 lb 14.1 oz)   Height:       Physical Exam General:NAD, WDWN, elderly female Heart:RRR, +0/9 systolic murmur, no rub or gallop Lungs:mostly CTAB, breath sounds decreased in bases, nml WOB Abdomen:soft, mild diffuse tenderness, ND Extremities:no edema b/l Dialysis Access: LU AVF +b/t   Filed Weights   11/27/17 2345 11/28/17 0350 11/28/17 0427  Weight: 57.4 kg (126 lb 8.7 oz) 56.8 kg (125 lb 3.5 oz) 57.1 kg (125 lb 14.1 oz)    Intake/Output Summary (Last 24 hours) at 11/28/2017 1026 Last data filed at 11/28/2017 1004 Gross per 24 hour  Intake 120 ml  Output 630 ml  Net -510 ml    Additional Objective Labs: Basic Metabolic Panel: Recent Labs  Lab 11/27/17 0515 11/28/17 0608  NA 134* 135  K 4.6 4.4  CL 93* 93*  CO2 25 27  GLUCOSE 96 103*  BUN 41* 27*  CREATININE 8.03* 6.62*  CALCIUM 9.5 8.8*  PHOS  --  6.3*   Liver Function Tests: Recent Labs  Lab 11/27/17 1137 11/28/17 0608  AST 13*  --   ALT 9*  --   ALKPHOS 49  --   BILITOT 0.8  --   PROT 6.9  --   ALBUMIN 3.3* 3.1*   Recent Labs  Lab 11/27/17 1137  LIPASE 37   CBC: Recent Labs  Lab 11/27/17 0515 11/28/17 0608  WBC 5.7 7.8  NEUTROABS 4.4  --   HGB 11.1* 10.3*  HCT 35.5* 32.6*  MCV 98.6 96.7  PLT 146* 153   Cardiac Enzymes: Recent Labs  Lab 11/27/17 1137 11/27/17 2009  TROPONINI <0.03 <0.03   CBG: Recent Labs  Lab 11/27/17 2245  GLUCAP 132*    Lab Results  Component Value Date   INR 1.30 02/17/2017   INR 1.14 09/21/2016   INR  1.06 07/13/2012   Studies/Results: Dg Chest 2 View  Result Date: 11/27/2017 CLINICAL DATA:  Chest pain and cough tonight. EXAM: CHEST - 2 VIEW COMPARISON:  Chest radiograph October 24, 2017 FINDINGS: Cardiac silhouette is upper limits of normal in size. Tortuous calcified aorta. No pleural effusion or focal consolidation. No pneumothorax. Vascular clips LEFT upper extremity. Osseous structures are nonsuspicious. Surgical clips in the included right abdomen compatible with cholecystectomy. IMPRESSION: No acute cardiopulmonary process. Aortic Atherosclerosis (ICD10-I70.0). Electronically Signed   By: Elon Alas M.D.   On: 11/27/2017 06:01    Medications:  . cloNIDine  0.2 mg Transdermal Weekly  . feeding supplement  1 Container Oral TID BM  . feeding supplement (ENSURE ENLIVE)  237 mL Oral BID BM  . ferric citrate  420 mg Oral TID WC  . heparin  5,000 Units Subcutaneous Q8H  . sodium chloride flush  3 mL Intravenous Q12H    Dialysis Orders: [facility]  EDW 59 kg 2k/2.5 ca  4hrs  Access LUA AVF     Heparin 3000 Calcitriol 2 mcg IV/HD  Mircera 75 mcg q 2wks ( last on 5/08)      Assessment/Plan: 1. Nausea/vomiting - improving. Per primary. 2. Chest pain - improved. Additional w/u per primary 3. ESRD - HD finished early this AM. Plan for HD later today to get back on schedule. K 4.4 this am. 4. Anemia of CKD- Hgb 10.3.  No indication for ESA at this time. Continue to follow.  5. Secondary hyperparathyroidism - P 6.3, Ca 8.8. Continue binders, VDRA.  6. HTN/volume - BP elevated, improved post HD.  Amlodipine on hold d/t n/v. 2kg under EDW, will need EDW adjusted at d/c.  7. Nutrition - Alb 3.1. Renal/Carb modified diet w/fluid restrictions. Nepro. Renavite.  8. Hx CVA  Jen Mow, PA-C [facility] Pager: [phone] 11/28/2017,10:26 AM  LOS: 1 day

## 2017-11-28 NOTE — Care Management Note (Signed)
Case Management Note  Patient Details  Name: Nancy [name] MRN: [id number] Date of Birth: 15-Dec-1940  Subjective/Objective:      Pt in with nausea and vomiting. She is from home alone.             Action/Plan: Pt states she is active with [facility] for [facility] services. She has a cane and her pharmacy delivers her medications. MD please place order for PT/OT as needed. CM following for d/c needs, physician orders.   Expected Discharge Date:                  Expected Discharge Plan:     In-House Referral:     Discharge planning Services     Post Acute Care Choice:    Choice offered to:     DME Arranged:    DME Agency:     HH Arranged:    HH Agency:     Status of Service:  In process, will continue to follow  If discussed at Long Length of Stay Meetings, dates discussed:    Additional Comments:  Pollie Friar, RN 11/28/2017, 3:12 PM

## 2017-11-28 NOTE — Care Management Obs Status (Signed)
[place] NOTIFICATION   Patient Details  Name: [place] B Penn MRN: [id number] Date of Birth: 1941/01/29   Medicare Observation Status Notification Given:  Yes    Pollie Friar, RN 11/28/2017, 12:33 PM

## 2017-11-28 NOTE — Progress Notes (Signed)
[facility]  Patient Status: Active (receiving services up to time of hospitalization)  [facility] is providing the following services: RN and PT  If patient discharges after hours, please call [phone].   Nancy [name] 11/28/2017, 10:05 AM

## 2017-11-28 NOTE — Telephone Encounter (Signed)
Hospital TOC per Dr Ronalee Red, discharge 11/28/2017, appt 12/04/2017.

## 2017-11-28 NOTE — Progress Notes (Signed)
   Subjective:  Ms. Basara reports eating breakfast this morning without issue. She denies recurrent abdominal pain, nausea, or emesis. She had dialysis yesterday and denies trouble breathing. She endorses chronic lower back pain, for which she takes tylenol.  Objective:  Vital signs in last 24 hours: Vitals:   11/28/17 0300 11/28/17 0330 11/28/17 0350 11/28/17 0427  BP: (!) 170/69 (!) 175/72 (!) 167/81 (!) 184/83  Pulse: (!) 108 (!) 110 (!) 109 (!) 108  Resp: 18 17 18 18   Temp:   97.9 F (36.6 C) 98.4 F (36.9 C)  TempSrc:   Oral Oral  SpO2:   99% 100%  Weight:   125 lb 3.5 oz (56.8 kg) 125 lb 14.1 oz (57.1 kg)  Height:       GEN: Lying in bed in NAD CV: Tachycardic & RR, 3/6 systolic murmur PULM: CTAB, no wheezes or rales ABD: Soft, NT, ND, +BS MSK: Trace LE edema  Assessment/Plan:  Principal Problem:   Nausea & vomiting Active Problems:   Essential hypertension   Non-intractable vomiting with nausea   End stage renal disease (HCC)  Nausea and vomiting, resolved Atypical chest pain, resolved Chest pain episodes related to her emesis and have now resolved. Unclear etiology for her recurrent N/V and abdominal pain after dialysis. She started dialysis ~8-9 months ago and our current working diagnosis is that she is sensitive to dialysis and not tolerating it well overall. Discussed importance of attending all dialysis sessions. - HD as below - Zofran PRN - Continue PO intake as tolerated - Likely discharge later today - Will ensure outpatient follow up  ESRD on HD MWF - Dialysis yesterday and today  HTN - Continue home clonidine patch  Dispo: Anticipated discharge in approximately 0-1 day(s).   Colbert Ewing, MD 11/28/2017, 6:19 AM Pager: Mamie Nick [phone]

## 2017-12-01 DIAGNOSIS — N186 End stage renal disease: Secondary | ICD-10-CM | POA: Diagnosis not present

## 2017-12-01 DIAGNOSIS — N2581 Secondary hyperparathyroidism of renal origin: Secondary | ICD-10-CM | POA: Diagnosis not present

## 2017-12-01 DIAGNOSIS — D689 Coagulation defect, unspecified: Secondary | ICD-10-CM | POA: Diagnosis not present

## 2017-12-01 DIAGNOSIS — Z23 Encounter for immunization: Secondary | ICD-10-CM | POA: Diagnosis not present

## 2017-12-01 DIAGNOSIS — D631 Anemia in chronic kidney disease: Secondary | ICD-10-CM | POA: Diagnosis not present

## 2017-12-01 DIAGNOSIS — R51 Headache: Secondary | ICD-10-CM | POA: Diagnosis not present

## 2017-12-02 DIAGNOSIS — K5792 Diverticulitis of intestine, part unspecified, without perforation or abscess without bleeding: Secondary | ICD-10-CM | POA: Diagnosis not present

## 2017-12-02 DIAGNOSIS — I132 Hypertensive heart and chronic kidney disease with heart failure and with stage 5 chronic kidney disease, or end stage renal disease: Secondary | ICD-10-CM | POA: Diagnosis not present

## 2017-12-02 DIAGNOSIS — N186 End stage renal disease: Secondary | ICD-10-CM | POA: Diagnosis not present

## 2017-12-02 DIAGNOSIS — I350 Nonrheumatic aortic (valve) stenosis: Secondary | ICD-10-CM | POA: Diagnosis not present

## 2017-12-02 DIAGNOSIS — Z8673 Personal history of transient ischemic attack (TIA), and cerebral infarction without residual deficits: Secondary | ICD-10-CM | POA: Diagnosis not present

## 2017-12-02 DIAGNOSIS — Z992 Dependence on renal dialysis: Secondary | ICD-10-CM | POA: Diagnosis not present

## 2017-12-02 DIAGNOSIS — G4700 Insomnia, unspecified: Secondary | ICD-10-CM | POA: Diagnosis not present

## 2017-12-02 DIAGNOSIS — I509 Heart failure, unspecified: Secondary | ICD-10-CM | POA: Diagnosis not present

## 2017-12-02 NOTE — Telephone Encounter (Signed)
Transition Care Management Follow-up Telephone Call   Date discharged? 11/28/2017   How have you been since you were released from the hospital? "I'm doing pretty good"   Do you understand why you were in the hospital? yes   Do you understand the discharge instructions? yes   Where were you discharged to? Home   Items Reviewed:  Medications reviewed: yes-there is some confusion about her meds-states she is waiting on the pharmacy to deliver her medications tomorrow. Pt instructed to bring all medications to appt on 5/30.  Pt lives alone and has some visual problems.   Allergies reviewed: yes  Dietary changes reviewed: low sodium heart healthy   Referrals reviewed: N/A   Functional Questionnaire:   Activities of Daily Living (ADLs):   She states they are independent in the following: ambulation, bathing and hygiene, feeding, continence, grooming, toileting and dressing States they require assistance with the following: no assistance needed   Any transportation issues/concerns?: no, pt's family member brings her to appts   Any patient concerns?  no   Confirmed importance and date/time of follow-up visits scheduled yes  Provider Appointment booked with [facility] on 12/04/2017 @10 :15  Confirmed with patient if condition begins to worsen call PCP or go to the ER.  Patient was given the office number and encouraged to call back with question or concerns.  : yes

## 2017-12-03 DIAGNOSIS — N2581 Secondary hyperparathyroidism of renal origin: Secondary | ICD-10-CM | POA: Diagnosis not present

## 2017-12-03 DIAGNOSIS — D631 Anemia in chronic kidney disease: Secondary | ICD-10-CM | POA: Diagnosis not present

## 2017-12-03 DIAGNOSIS — D689 Coagulation defect, unspecified: Secondary | ICD-10-CM | POA: Diagnosis not present

## 2017-12-03 DIAGNOSIS — N186 End stage renal disease: Secondary | ICD-10-CM | POA: Diagnosis not present

## 2017-12-03 DIAGNOSIS — R51 Headache: Secondary | ICD-10-CM | POA: Diagnosis not present

## 2017-12-03 DIAGNOSIS — Z23 Encounter for immunization: Secondary | ICD-10-CM | POA: Diagnosis not present

## 2017-12-04 ENCOUNTER — Encounter: Payer: Self-pay | Admitting: Internal Medicine

## 2017-12-04 ENCOUNTER — Ambulatory Visit: Payer: Self-pay

## 2017-12-05 DIAGNOSIS — Z992 Dependence on renal dialysis: Secondary | ICD-10-CM | POA: Diagnosis not present

## 2017-12-05 DIAGNOSIS — N2581 Secondary hyperparathyroidism of renal origin: Secondary | ICD-10-CM | POA: Diagnosis not present

## 2017-12-05 DIAGNOSIS — N186 End stage renal disease: Secondary | ICD-10-CM | POA: Diagnosis not present

## 2017-12-05 DIAGNOSIS — D689 Coagulation defect, unspecified: Secondary | ICD-10-CM | POA: Diagnosis not present

## 2017-12-05 DIAGNOSIS — R51 Headache: Secondary | ICD-10-CM | POA: Diagnosis not present

## 2017-12-05 DIAGNOSIS — Z23 Encounter for immunization: Secondary | ICD-10-CM | POA: Diagnosis not present

## 2017-12-05 DIAGNOSIS — D631 Anemia in chronic kidney disease: Secondary | ICD-10-CM | POA: Diagnosis not present

## 2017-12-05 DIAGNOSIS — I150 Renovascular hypertension: Secondary | ICD-10-CM | POA: Diagnosis not present

## 2017-12-08 DIAGNOSIS — N186 End stage renal disease: Secondary | ICD-10-CM | POA: Diagnosis not present

## 2017-12-08 DIAGNOSIS — D689 Coagulation defect, unspecified: Secondary | ICD-10-CM | POA: Diagnosis not present

## 2017-12-08 DIAGNOSIS — R51 Headache: Secondary | ICD-10-CM | POA: Diagnosis not present

## 2017-12-08 DIAGNOSIS — D631 Anemia in chronic kidney disease: Secondary | ICD-10-CM | POA: Diagnosis not present

## 2017-12-08 DIAGNOSIS — N2581 Secondary hyperparathyroidism of renal origin: Secondary | ICD-10-CM | POA: Diagnosis not present

## 2017-12-09 DIAGNOSIS — Z8673 Personal history of transient ischemic attack (TIA), and cerebral infarction without residual deficits: Secondary | ICD-10-CM | POA: Diagnosis not present

## 2017-12-09 DIAGNOSIS — I350 Nonrheumatic aortic (valve) stenosis: Secondary | ICD-10-CM | POA: Diagnosis not present

## 2017-12-09 DIAGNOSIS — K5792 Diverticulitis of intestine, part unspecified, without perforation or abscess without bleeding: Secondary | ICD-10-CM | POA: Diagnosis not present

## 2017-12-09 DIAGNOSIS — I509 Heart failure, unspecified: Secondary | ICD-10-CM | POA: Diagnosis not present

## 2017-12-09 DIAGNOSIS — Z992 Dependence on renal dialysis: Secondary | ICD-10-CM | POA: Diagnosis not present

## 2017-12-09 DIAGNOSIS — I132 Hypertensive heart and chronic kidney disease with heart failure and with stage 5 chronic kidney disease, or end stage renal disease: Secondary | ICD-10-CM | POA: Diagnosis not present

## 2017-12-09 DIAGNOSIS — N186 End stage renal disease: Secondary | ICD-10-CM | POA: Diagnosis not present

## 2017-12-09 DIAGNOSIS — G4700 Insomnia, unspecified: Secondary | ICD-10-CM | POA: Diagnosis not present

## 2017-12-10 DIAGNOSIS — R51 Headache: Secondary | ICD-10-CM | POA: Diagnosis not present

## 2017-12-10 DIAGNOSIS — N186 End stage renal disease: Secondary | ICD-10-CM | POA: Diagnosis not present

## 2017-12-10 DIAGNOSIS — N2581 Secondary hyperparathyroidism of renal origin: Secondary | ICD-10-CM | POA: Diagnosis not present

## 2017-12-10 DIAGNOSIS — D689 Coagulation defect, unspecified: Secondary | ICD-10-CM | POA: Diagnosis not present

## 2017-12-10 DIAGNOSIS — D631 Anemia in chronic kidney disease: Secondary | ICD-10-CM | POA: Diagnosis not present

## 2017-12-12 DIAGNOSIS — N2581 Secondary hyperparathyroidism of renal origin: Secondary | ICD-10-CM | POA: Diagnosis not present

## 2017-12-12 DIAGNOSIS — N186 End stage renal disease: Secondary | ICD-10-CM | POA: Diagnosis not present

## 2017-12-12 DIAGNOSIS — D689 Coagulation defect, unspecified: Secondary | ICD-10-CM | POA: Diagnosis not present

## 2017-12-12 DIAGNOSIS — D631 Anemia in chronic kidney disease: Secondary | ICD-10-CM | POA: Diagnosis not present

## 2017-12-12 DIAGNOSIS — R51 Headache: Secondary | ICD-10-CM | POA: Diagnosis not present

## 2017-12-15 ENCOUNTER — Other Ambulatory Visit: Payer: Self-pay

## 2017-12-15 ENCOUNTER — Emergency Department (HOSPITAL_COMMUNITY): Payer: Medicare Other

## 2017-12-15 ENCOUNTER — Inpatient Hospital Stay (HOSPITAL_COMMUNITY)
Admission: EM | Admit: 2017-12-15 | Discharge: 2017-12-19 | DRG: 391 | Disposition: A | Discharge status: Home Health | Payer: Medicare Other | Attending: Internal Medicine | Admitting: Internal Medicine

## 2017-12-15 DIAGNOSIS — I132 Hypertensive heart and chronic kidney disease with heart failure and with stage 5 chronic kidney disease, or end stage renal disease: Secondary | ICD-10-CM | POA: Diagnosis present

## 2017-12-15 DIAGNOSIS — R10819 Abdominal tenderness, unspecified site: Secondary | ICD-10-CM | POA: Diagnosis present

## 2017-12-15 DIAGNOSIS — R400 Somnolence: Secondary | ICD-10-CM | POA: Diagnosis not present

## 2017-12-15 DIAGNOSIS — I509 Heart failure, unspecified: Secondary | ICD-10-CM | POA: Diagnosis present

## 2017-12-15 DIAGNOSIS — R000 Tachycardia, unspecified: Secondary | ICD-10-CM | POA: Diagnosis not present

## 2017-12-15 DIAGNOSIS — R110 Nausea: Secondary | ICD-10-CM | POA: Diagnosis not present

## 2017-12-15 DIAGNOSIS — I350 Nonrheumatic aortic (valve) stenosis: Secondary | ICD-10-CM | POA: Diagnosis present

## 2017-12-15 DIAGNOSIS — R0602 Shortness of breath: Secondary | ICD-10-CM

## 2017-12-15 DIAGNOSIS — Z66 Do not resuscitate: Secondary | ICD-10-CM | POA: Diagnosis present

## 2017-12-15 DIAGNOSIS — R42 Dizziness and giddiness: Secondary | ICD-10-CM | POA: Diagnosis not present

## 2017-12-15 DIAGNOSIS — M1990 Unspecified osteoarthritis, unspecified site: Secondary | ICD-10-CM | POA: Diagnosis present

## 2017-12-15 DIAGNOSIS — R112 Nausea with vomiting, unspecified: Secondary | ICD-10-CM | POA: Diagnosis not present

## 2017-12-15 DIAGNOSIS — N186 End stage renal disease: Secondary | ICD-10-CM | POA: Diagnosis present

## 2017-12-15 DIAGNOSIS — R0789 Other chest pain: Secondary | ICD-10-CM | POA: Diagnosis present

## 2017-12-15 DIAGNOSIS — D631 Anemia in chronic kidney disease: Secondary | ICD-10-CM | POA: Diagnosis not present

## 2017-12-15 DIAGNOSIS — D689 Coagulation defect, unspecified: Secondary | ICD-10-CM | POA: Diagnosis not present

## 2017-12-15 DIAGNOSIS — Z79899 Other long term (current) drug therapy: Secondary | ICD-10-CM | POA: Diagnosis not present

## 2017-12-15 DIAGNOSIS — I120 Hypertensive chronic kidney disease with stage 5 chronic kidney disease or end stage renal disease: Secondary | ICD-10-CM | POA: Diagnosis not present

## 2017-12-15 DIAGNOSIS — I10 Essential (primary) hypertension: Secondary | ICD-10-CM | POA: Diagnosis not present

## 2017-12-15 DIAGNOSIS — Z992 Dependence on renal dialysis: Secondary | ICD-10-CM | POA: Diagnosis not present

## 2017-12-15 DIAGNOSIS — N189 Chronic kidney disease, unspecified: Secondary | ICD-10-CM

## 2017-12-15 DIAGNOSIS — Z8673 Personal history of transient ischemic attack (TIA), and cerebral infarction without residual deficits: Secondary | ICD-10-CM

## 2017-12-15 DIAGNOSIS — Z885 Allergy status to narcotic agent status: Secondary | ICD-10-CM | POA: Diagnosis not present

## 2017-12-15 DIAGNOSIS — R51 Headache: Secondary | ICD-10-CM | POA: Diagnosis not present

## 2017-12-15 DIAGNOSIS — R0600 Dyspnea, unspecified: Secondary | ICD-10-CM | POA: Diagnosis not present

## 2017-12-15 DIAGNOSIS — I951 Orthostatic hypotension: Secondary | ICD-10-CM | POA: Diagnosis not present

## 2017-12-15 DIAGNOSIS — Z7982 Long term (current) use of aspirin: Secondary | ICD-10-CM | POA: Diagnosis not present

## 2017-12-15 DIAGNOSIS — E440 Moderate protein-calorie malnutrition: Secondary | ICD-10-CM

## 2017-12-15 DIAGNOSIS — N2581 Secondary hyperparathyroidism of renal origin: Secondary | ICD-10-CM | POA: Diagnosis not present

## 2017-12-15 DIAGNOSIS — R079 Chest pain, unspecified: Secondary | ICD-10-CM | POA: Diagnosis not present

## 2017-12-15 LAB — CBC WITH DIFFERENTIAL/PLATELET
ABS IMMATURE GRANULOCYTES: 0.1 10*3/uL (ref 0.0–0.1)
BASOS PCT: 0 %
Basophils Absolute: 0 10*3/uL (ref 0.0–0.1)
Eosinophils Absolute: 0 10*3/uL (ref 0.0–0.7)
Eosinophils Relative: 0 %
HEMATOCRIT: 32.2 % — AB (ref 36.0–46.0)
Hemoglobin: 10 g/dL — ABNORMAL LOW (ref 12.0–15.0)
Immature Granulocytes: 1 %
LYMPHS ABS: 0.7 10*3/uL (ref 0.7–4.0)
Lymphocytes Relative: 11 %
MCH: 30 pg (ref 26.0–34.0)
MCHC: 31.1 g/dL (ref 30.0–36.0)
MCV: 96.7 fL (ref 78.0–100.0)
MONOS PCT: 6 %
Monocytes Absolute: 0.4 10*3/uL (ref 0.1–1.0)
NEUTROS ABS: 4.8 10*3/uL (ref 1.7–7.7)
NEUTROS PCT: 82 %
PLATELETS: 190 10*3/uL (ref 150–400)
RBC: 3.33 MIL/uL — ABNORMAL LOW (ref 3.87–5.11)
RDW: 15.3 % (ref 11.5–15.5)
WBC: 5.9 10*3/uL (ref 4.0–10.5)

## 2017-12-15 LAB — BASIC METABOLIC PANEL
ANION GAP: 15 (ref 5–15)
BUN: 21 mg/dL — ABNORMAL HIGH (ref 6–20)
CHLORIDE: 98 mmol/L — AB (ref 101–111)
CO2: 27 mmol/L (ref 22–32)
Calcium: 9.3 mg/dL (ref 8.9–10.3)
Creatinine, Ser: 6.52 mg/dL — ABNORMAL HIGH (ref 0.44–1.00)
GFR calc non Af Amer: 6 mL/min — ABNORMAL LOW (ref >60)
GFR, EST AFRICAN AMERICAN: 6 mL/min — AB (ref >60)
Glucose, Bld: 109 mg/dL — ABNORMAL HIGH (ref 65–99)
Potassium: 4.5 mmol/L (ref 3.5–5.1)
Sodium: 140 mmol/L (ref 135–145)

## 2017-12-15 LAB — I-STAT TROPONIN, ED: Troponin i, poc: 0 ng/mL (ref 0.00–0.08)

## 2017-12-15 MED ORDER — ONDANSETRON 4 MG PO TBDP
4.0000 mg | ORAL_TABLET | Freq: Once | ORAL | Status: AC
  Administered 2017-12-15: 4 mg via ORAL
  Filled 2017-12-15: qty 1

## 2017-12-15 MED ORDER — ONDANSETRON HCL 4 MG/2ML IJ SOLN
4.0000 mg | Freq: Once | INTRAMUSCULAR | Status: DC
  Filled 2017-12-15: qty 2

## 2017-12-15 MED ORDER — ASPIRIN 81 MG PO CHEW
324.0000 mg | CHEWABLE_TABLET | Freq: Once | ORAL | Status: AC
  Administered 2017-12-15: 324 mg via ORAL
  Filled 2017-12-15: qty 4

## 2017-12-15 MED ORDER — PROMETHAZINE HCL 25 MG/ML IJ SOLN
12.5000 mg | Freq: Once | INTRAMUSCULAR | Status: AC
  Administered 2017-12-15: 12.5 mg via INTRAVENOUS
  Filled 2017-12-15: qty 1

## 2017-12-15 NOTE — ED Triage Notes (Signed)
Pt arriving via [facility] from home. Pt reports stabbing CP that radiates to her back since Friday that got worse halfway during dialysis today. Pt was able to finish the treatment but did experience some N/V. EMS VS 220/106, HR 116 with occasional PVC's, 99% RA.

## 2017-12-15 NOTE — ED Provider Notes (Signed)
[place] EMERGENCY DEPARTMENT Provider Note   CSN: [id number] Arrival date & time: 12/15/17  1938     History   Chief Complaint Chief Complaint  Patient presents with  . Chest Pain    HPI Nancy [name] is a 77 y.o. female with a past medical history of CHF, ESRD currently on dialysis, prior CVA, hypertension, who presents to ED for evaluation of left-sided chest pain radiating to her back for the past 4 days.  She was at her dialysis session today when her symptoms got worse.  She tells me that she did not complete her session but EMS states that she did.  She did have one episode of nonbloody, nonbilious emesis during dialysis and about 4-5 episodes after she got home.  She states that the chest pain is intermittent.  She denies any shortness of breath, abdominal pain, fever, diarrhea, injuries or falls.  Of note, patient was admitted to hospital from May 23 to May 24 for nausea and vomiting, chest pain rule out.  HPI  Past Medical History:  Diagnosis Date  . Anemia   . Aortic stenosis   . Bacterial sinusitis 09/17/2011  . CHF (congestive heart failure) ([place])   . CKD (chronic kidney disease) stage 4, GFR 15-29 ml/min ([place]) 08/11/2006   Cr continues to increase. Proteinuria on UA 02/10/12.    . Colitis   . CVA (cerebrovascular accident) [facility])    New hemorrhagic per CT scan '09  . Diverticulosis of colon   . Dysfunctional uterine bleeding   . ESRD (end stage renal disease) on dialysis ([place])    "MWF; [facility]" (11/27/2017)  . Fecal impaction ([place])   . Headache(784.0)   . Heart murmur   . HERNIORRHAPHY, HX OF 08/11/2006  . Hypertension   . OA (osteoarthritis)    bilateral knees  . Postmenopausal   . Pulmonary nodule   . TINEA CRURIS 01/12/2007    Patient Active Problem List   Diagnosis Date Noted  . Nausea & vomiting 11/27/2017  . Effusion, right knee   . Encephalopathy   . Intractable nausea and vomiting 10/24/2017  . Secondary hyperparathyroidism  of renal origin ([place]) 01/15/2017  . Mitral valve annular calcification   . Constipation 10/15/2016  . Angina at rest [facility])   . Goals of care, counseling/discussion   . Metabolic acidosis 02/54/2706  . Aortic stenosis 05/21/2016  . Anemia associated with chronic renal failure 02/01/2016  . Atherosclerosis of aorta ([place]) 01/11/2015  . End stage renal disease ([place]) 02/04/2013  . Non-intractable vomiting with nausea 08/17/2012  . Glaucoma 03/18/2012  . Health care maintenance 09/17/2011  . Osteoarthrosis involving lower leg 10/31/2008  . History of CVA (cerebrovascular accident) 01/28/2008  . Hyperlipidemia 02/13/2007  . PULMONARY NODULES 01/12/2007  . Left ventricular hypertrophy 09/02/2006  . Essential hypertension 08/11/2006  . GERD 08/11/2006  . Diverticulitis of colon 08/11/2006    Past Surgical History:  Procedure Laterality Date  . ABDOMINAL HYSTERECTOMY    . AV FISTULA PLACEMENT Left 02/19/2017   Procedure: CREATION OF LEFT ARM BRACHIOCEPHALIC ARTERIOVENOUS (AV) FISTULA;  Surgeon: Rosetta Posner, MD;  Location: [place];  Service: Vascular;  Laterality: Left;  . BASCILIC VEIN TRANSPOSITION Left 04/23/2017   Procedure: BASILIC VEIN TRANSPOSITION SECOND STAGE;  Surgeon: Rosetta Posner, MD;  Location: [place];  Service: Vascular;  Laterality: Left;  . CHOLECYSTECTOMY  2009  . COLONOSCOPY    . INGUINAL HERNIA REPAIR  2008  . INSERTION OF DIALYSIS CATHETER  Right 02/19/2017   Procedure: INSERTION OF TUNNELED DIALYSIS CATHETER - RIGHT INTERNAL JUGULAR PLACEMENT;  Surgeon: Rosetta Posner, MD;  Location: [place];  Service: Vascular;  Laterality: Right;  . IRIDOTOMY / IRIDECTOMY     Laser, right eye 12/26/11 left eye 01/24/12  . MASS EXCISION Left 05/07/2013   Procedure: EXCISION CYST;  Surgeon: Myrtha Mantis., MD;  Location: [place];  Service: Ophthalmology;  Laterality: Left;     OB History   None      Home Medications    Prior to Admission medications     Medication Sig Start Date End Date Taking? Authorizing Provider  acetaminophen (TYLENOL) 500 MG tablet Take 2 tablets (1,000 mg total) by mouth every 8 (eight) hours as needed for mild pain, moderate pain, fever or headache. 07/22/17  Yes Colbert Ewing, MD  cloNIDine (CATAPRES - DOSED IN MG/24 HR) 0.2 mg/24hr patch Place 1 patch (0.2 mg total) onto the skin every 7 (seven) days. 11/28/17  Yes Colbert Ewing, MD  lidocaine-prilocaine (EMLA) cream Apply 1 application topically 3 (three) times a week. APPLY SMALL AMOUNT TO ACCESS SITE (AVF) 3 TIMES A WEEK 1 HOUR BEFORE DIALYSIS. COVER WITH OCCLUSIVE DRESSING (SARAN WRAP) 11/20/17  Yes [provider]  nitroGLYCERIN (NITROSTAT) 0.3 MG SL tablet Place 1 tablet (0.3 mg total) under the tongue every 5 (five) minutes as needed for chest pain. 02/27/17  Yes Sid Falcon, MD  senna-docusate (SENOKOT-S) 8.6-50 MG tablet Take 1 tablet by mouth at bedtime as needed for mild constipation or moderate constipation. 11/28/17 12/28/17 Yes Colbert Ewing, MD  amLODipine (NORVASC) 10 MG tablet Take 1/2 tablet by mouth according to prior schedule Patient not taking: Reported on 11/27/2017 09/20/17   Molt, Bethany, DO  aspirin EC 81 MG EC tablet Take 1 tablet (81 mg total) by mouth daily. Patient not taking: Reported on 11/27/2017 12/10/16   Kalman Shan Ratliff, DO  calcitRIOL (ROCALTROL) 0.25 MCG capsule Take 1 capsule (0.25 mcg total) by mouth daily. Patient not taking: Reported on 11/27/2017 01/15/17   Sid Falcon, MD  calcium acetate (PHOSLO) 667 MG capsule Take 2 capsules (1,334 mg total) by mouth 3 (three) times daily with meals. Patient not taking: Reported on 12/15/2017 02/22/17   Melanee Spry, MD  polyethylene glycol (MIRALAX / Floria Raveling) packet Take 17 g by mouth daily. Patient not taking: Reported on 11/27/2017 10/28/17   Katherine Roan, MD  promethazine (PHENERGAN) 12.5 MG tablet Take 1 tablet (12.5 mg total) by mouth every 6 (six) hours as  needed for up to 2 days for nausea or vomiting. Patient not taking: Reported on 11/27/2017 11/06/17 11/08/17  Katherine Roan, MD  SENSIPAR 60 MG tablet Take 1 tablet (60 mg total) by mouth daily. Patient not taking: Reported on 11/27/2017 01/15/17   Sid Falcon, MD    Family History Family History  Problem Relation Age of Onset  . Hypertension Mother   . Congestive Heart Failure Mother   . Heart attack Brother 21    Social History Social History   Tobacco Use  . Smoking status: Never Smoker  . Smokeless tobacco: Never Used  Substance Use Topics  . Alcohol use: No    Alcohol/week: 0.0 oz  . Drug use: No    Comment: 08/15/08 UDS + cocaine     Allergies   Hydrocodone-acetaminophen   Review of Systems Review of Systems  Constitutional: Negative for appetite change, chills and fever.  HENT: Negative  for ear pain, rhinorrhea, sneezing and sore throat.   Eyes: Negative for photophobia and visual disturbance.  Respiratory: Negative for cough, chest tightness, shortness of breath and wheezing.   Cardiovascular: Positive for chest pain. Negative for palpitations.  Gastrointestinal: Positive for nausea and vomiting. Negative for abdominal pain, blood in stool, constipation and diarrhea.  Genitourinary: Negative for dysuria, hematuria and urgency.  Musculoskeletal: Positive for back pain. Negative for myalgias.  Skin: Negative for rash.  Neurological: Negative for dizziness, weakness and light-headedness.     Physical Exam Updated Vital Signs BP (!) 191/86   Pulse (!) 113   Temp 98.2 F (36.8 C) (Oral)   Resp 17   Ht 5\' 3"  (1.6 m)   Wt 52 kg (114 lb 10.2 oz) Comment: per EMS  SpO2 100%   BMI 20.31 kg/m   Physical Exam  Constitutional: She appears well-developed and well-nourished. No distress.  Dry heaving on my examination.  HENT:  Head: Normocephalic and atraumatic.  Nose: Nose normal.  Eyes: Conjunctivae and EOM are normal. Left eye exhibits no discharge. No  scleral icterus.  Neck: Normal range of motion. Neck supple.  Cardiovascular: Regular rhythm, normal heart sounds and intact distal pulses. Tachycardia present. Exam reveals no gallop and no friction rub.  No murmur heard. Pulmonary/Chest: Effort normal and breath sounds normal. No respiratory distress.  Abdominal: Soft. Bowel sounds are normal. She exhibits no distension. There is no tenderness. There is no guarding.  No abdominal tenderness to palpation.  Musculoskeletal: Normal range of motion. She exhibits no edema.  No lower extremity edema bilaterally.  Neurological: She is alert. She exhibits normal muscle tone. Coordination normal.  Skin: Skin is warm and dry. No rash noted.  Psychiatric: She has a normal mood and affect.  Nursing note and vitals reviewed.    ED Treatments / Results  Labs (all labs ordered are listed, but only abnormal results are displayed) Labs Reviewed  BASIC METABOLIC PANEL - Abnormal; Notable for the following components:      Result Value   Chloride 98 (*)    Glucose, Bld 109 (*)    BUN 21 (*)    Creatinine, Ser 6.52 (*)    GFR calc non Af Amer 6 (*)    GFR calc Af Amer 6 (*)    All other components within normal limits  CBC WITH DIFFERENTIAL/PLATELET - Abnormal; Notable for the following components:   RBC 3.33 (*)    Hemoglobin 10.0 (*)    HCT 32.2 (*)    All other components within normal limits  LIPASE, BLOOD  HEPATIC FUNCTION PANEL  I-STAT TROPONIN, ED  I-STAT TROPONIN, ED    EKG None  Radiology Dg Chest Port 1 View  Result Date: 12/15/2017 CLINICAL DATA:  Dyspnea, nausea and vomiting x1 day. EXAM: PORTABLE CHEST 1 VIEW COMPARISON:  None. FINDINGS: The heart size and mediastinal contours are within normal limits. Nonaneurysmal atherosclerotic ectatic aorta. Both lungs are clear. The visualized skeletal structures are unremarkable. IMPRESSION: No active disease.  Aortic atherosclerosis. Electronically Signed   By: Ashley Royalty M.D.   On:  12/15/2017 22:04    Procedures Procedures (including critical care time)  Medications Ordered in ED Medications  aspirin chewable tablet 324 mg (324 mg Oral Given 12/15/17 2026)  ondansetron (ZOFRAN-ODT) disintegrating tablet 4 mg (4 mg Oral Given 12/15/17 2033)  promethazine (PHENERGAN) injection 12.5 mg (12.5 mg Intravenous Given 12/15/17 2149)     Initial Impression / Assessment and Plan / ED Course  I have reviewed the triage vital signs and the nursing notes.  Pertinent labs & imaging results that were available during my care of the patient were reviewed by me and considered in my medical decision making (see chart for details).  Clinical Course as of Dec 16 33  Mon Dec 15, 2017  2316 Patient reports improvement in nausea with Phenergan. Improvement in CP with aspirin. Currently symptom free.   [HK]    Clinical Course User Index [HK] Delia Heady, PA-C    77 year old female with a past medical history of CHF, ESRD on dialysis, prior CVA, hypertension presents to ED for evaluation of left-sided chest pain radiating to back for the past 4 days.  She also reports emesis.  Symptoms worsen during her dialysis session today.  States that she had one episode of nonbloody, nonbilious emesis during dialysis and about 4-5 episodes after she got home.  Reports chest pain is intermittent.  Denies any shortness of breath, abdominal pain, fever, diarrhea, injuries or falls.  On physical exam patient is initially tachycardic.  No abdominal tenderness to palpation noted.  She does not appear fluid overloaded.  Initial troponin unremarkable.  CBC, BMP unremarkable.  Chest x-ray unremarkable.  Patient is pending delta troponin, lipase and LFTs. Wears clonidine patch for HTN. Will need to be admitted for chest pain as she is high risk.  IM to admit.   Portions of this note were generated with [employer]. Dictation errors may occur despite best attempts at proofreading.  Final  Clinical Impressions(s) / ED Diagnoses   Final diagnoses:  SOB (shortness of breath)    ED Discharge Orders    None       Delia Heady, PA-C 12/16/17 0058    Julianne Rice, MD 12/18/17 1401

## 2017-12-16 ENCOUNTER — Observation Stay (HOSPITAL_COMMUNITY): Payer: Medicare Other

## 2017-12-16 DIAGNOSIS — E440 Moderate protein-calorie malnutrition: Secondary | ICD-10-CM

## 2017-12-16 DIAGNOSIS — R112 Nausea with vomiting, unspecified: Principal | ICD-10-CM

## 2017-12-16 DIAGNOSIS — N186 End stage renal disease: Secondary | ICD-10-CM | POA: Diagnosis not present

## 2017-12-16 DIAGNOSIS — I10 Essential (primary) hypertension: Secondary | ICD-10-CM

## 2017-12-16 LAB — RENAL FUNCTION PANEL
ANION GAP: 19 — AB (ref 5–15)
Albumin: 3.5 g/dL (ref 3.5–5.0)
BUN: 27 mg/dL — ABNORMAL HIGH (ref 6–20)
CHLORIDE: 97 mmol/L — AB (ref 101–111)
CO2: 27 mmol/L (ref 22–32)
Calcium: 10.1 mg/dL (ref 8.9–10.3)
Creatinine, Ser: 7.89 mg/dL — ABNORMAL HIGH (ref 0.44–1.00)
GFR calc non Af Amer: 4 mL/min — ABNORMAL LOW (ref >60)
GFR, EST AFRICAN AMERICAN: 5 mL/min — AB (ref >60)
Glucose, Bld: 110 mg/dL — ABNORMAL HIGH (ref 65–99)
POTASSIUM: 3.6 mmol/L (ref 3.5–5.1)
Phosphorus: 6 mg/dL — ABNORMAL HIGH (ref 2.5–4.6)
Sodium: 143 mmol/L (ref 135–145)

## 2017-12-16 LAB — HEPATIC FUNCTION PANEL
ALK PHOS: 52 U/L (ref 38–126)
ALT: 8 U/L — ABNORMAL LOW (ref 14–54)
AST: 13 U/L — AB (ref 15–41)
Albumin: 3.6 g/dL (ref 3.5–5.0)
BILIRUBIN TOTAL: 0.6 mg/dL (ref 0.3–1.2)
Bilirubin, Direct: 0.2 mg/dL (ref 0.1–0.5)
Indirect Bilirubin: 0.4 mg/dL (ref 0.3–0.9)
TOTAL PROTEIN: 7.5 g/dL (ref 6.5–8.1)

## 2017-12-16 LAB — LIPASE, BLOOD: Lipase: 28 U/L (ref 11–51)

## 2017-12-16 LAB — I-STAT TROPONIN, ED: TROPONIN I, POC: 0.06 ng/mL (ref 0.00–0.08)

## 2017-12-16 LAB — TROPONIN I: Troponin I: <0.03 ng/mL (ref <0.03)

## 2017-12-16 LAB — MAGNESIUM: Magnesium: 2.1 mg/dL (ref 1.7–2.4)

## 2017-12-16 MED ORDER — CLONIDINE HCL 0.2 MG/24HR TD PTWK: 0.2000 mg | MEDICATED_PATCH | TRANSDERMAL | Status: DC

## 2017-12-16 MED ORDER — CINACALCET HCL 30 MG PO TABS
60.0000 mg | ORAL_TABLET | ORAL | Status: DC
  Administered 2017-12-17: 60 mg via ORAL
  Filled 2017-12-16 (×3): qty 2

## 2017-12-16 MED ORDER — ACETAMINOPHEN 325 MG PO TABS: 650.0000 mg | ORAL_TABLET | Freq: Four times a day (QID) | ORAL | Status: DC | PRN

## 2017-12-16 MED ORDER — PROMETHAZINE HCL 25 MG/ML IJ SOLN
12.5000 mg | INTRAMUSCULAR | Status: DC | PRN
  Administered 2017-12-17 (×2): 12.5 mg via INTRAVENOUS
  Filled 2017-12-16 (×2): qty 1

## 2017-12-16 MED ORDER — ACETAMINOPHEN 650 MG RE SUPP: 650.0000 mg | Freq: Four times a day (QID) | RECTAL | Status: DC | PRN

## 2017-12-16 MED ORDER — CALCIUM ACETATE (PHOS BINDER) 667 MG PO CAPS
1334.0000 mg | ORAL_CAPSULE | Freq: Three times a day (TID) | ORAL | Status: DC
  Administered 2017-12-16 – 2017-12-18 (×6): 1334 mg via ORAL
  Filled 2017-12-16 (×7): qty 2

## 2017-12-16 MED ORDER — PROMETHAZINE HCL 25 MG/ML IJ SOLN
12.5000 mg | Freq: Four times a day (QID) | INTRAMUSCULAR | Status: DC | PRN
  Administered 2017-12-17 – 2017-12-18 (×2): 12.5 mg via INTRAVENOUS
  Filled 2017-12-16 (×3): qty 1

## 2017-12-16 MED ORDER — CALCITRIOL 0.5 MCG PO CAPS
2.0000 ug | ORAL_CAPSULE | ORAL | Status: DC
  Administered 2017-12-17 – 2017-12-19 (×2): 2 ug via ORAL
  Filled 2017-12-16 (×2): qty 4

## 2017-12-16 MED ORDER — CHLORHEXIDINE GLUCONATE CLOTH 2 % EX PADS
6.0000 | MEDICATED_PAD | Freq: Every day | CUTANEOUS | Status: DC
  Administered 2017-12-19: 6 via TOPICAL

## 2017-12-16 MED ORDER — CINACALCET HCL 30 MG PO TABS
60.0000 mg | ORAL_TABLET | Freq: Every day | ORAL | Status: DC
  Administered 2017-12-16: 60 mg via ORAL
  Filled 2017-12-16: qty 2

## 2017-12-16 MED ORDER — PROMETHAZINE HCL 25 MG/ML IJ SOLN: 12.5000 mg | Freq: Four times a day (QID) | INTRAMUSCULAR | Status: DC | PRN

## 2017-12-16 MED ORDER — PROMETHAZINE HCL 25 MG/ML IJ SOLN
12.5000 mg | Freq: Once | INTRAMUSCULAR | Status: AC
  Administered 2017-12-16: 12.5 mg via INTRAVENOUS

## 2017-12-16 MED ORDER — CALCITRIOL 0.25 MCG PO CAPS
0.2500 ug | ORAL_CAPSULE | Freq: Every day | ORAL | Status: DC
  Administered 2017-12-16: 0.25 ug via ORAL
  Filled 2017-12-16: qty 1

## 2017-12-16 MED ORDER — NEPRO/CARBSTEADY PO LIQD
237.0000 mL | Freq: Two times a day (BID) | ORAL | Status: DC
  Administered 2017-12-16 (×2): 237 mL via ORAL
  Filled 2017-12-16 (×5): qty 237

## 2017-12-16 MED ORDER — AMLODIPINE BESYLATE 5 MG PO TABS
5.0000 mg | ORAL_TABLET | Freq: Every day | ORAL | Status: DC
  Administered 2017-12-16 – 2017-12-18 (×3): 5 mg via ORAL
  Filled 2017-12-16 (×5): qty 1

## 2017-12-16 MED ORDER — RENA-VITE PO TABS
1.0000 | ORAL_TABLET | Freq: Every day | ORAL | Status: DC
  Administered 2017-12-16 – 2017-12-18 (×3): 1 via ORAL
  Filled 2017-12-16 (×3): qty 1

## 2017-12-16 MED ORDER — BOOST / RESOURCE BREEZE PO LIQD CUSTOM
1.0000 | Freq: Three times a day (TID) | ORAL | Status: DC
  Administered 2017-12-16 – 2017-12-19 (×5): 1 via ORAL

## 2017-12-16 MED ORDER — SODIUM CHLORIDE 0.9% FLUSH
3.0000 mL | Freq: Two times a day (BID) | INTRAVENOUS | Status: DC
  Administered 2017-12-16 – 2017-12-18 (×6): 3 mL via INTRAVENOUS

## 2017-12-16 MED ORDER — ASPIRIN EC 81 MG PO TBEC
81.0000 mg | DELAYED_RELEASE_TABLET | Freq: Every day | ORAL | Status: DC
  Administered 2017-12-16 – 2017-12-19 (×4): 81 mg via ORAL
  Filled 2017-12-16 (×3): qty 1

## 2017-12-16 MED ORDER — HEPARIN SODIUM (PORCINE) 5000 UNIT/ML IJ SOLN
5000.0000 [IU] | Freq: Three times a day (TID) | INTRAMUSCULAR | Status: DC
  Administered 2017-12-16 – 2017-12-19 (×10): 5000 [IU] via SUBCUTANEOUS
  Filled 2017-12-16 (×10): qty 1

## 2017-12-16 MED ORDER — CLONIDINE HCL 0.2 MG/24HR TD PTWK
0.2000 mg | MEDICATED_PATCH | TRANSDERMAL | Status: DC
  Administered 2017-12-17: 0.2 mg via TRANSDERMAL
  Filled 2017-12-16: qty 1

## 2017-12-16 MED ORDER — POLYETHYLENE GLYCOL 3350 17 G PO PACK
17.0000 g | PACK | Freq: Every day | ORAL | Status: DC
  Administered 2017-12-17 – 2017-12-19 (×3): 17 g via ORAL
  Filled 2017-12-16 (×3): qty 1

## 2017-12-16 MED ORDER — SENNOSIDES-DOCUSATE SODIUM 8.6-50 MG PO TABS: 1.0000 | ORAL_TABLET | Freq: Every evening | ORAL | Status: DC | PRN

## 2017-12-16 MED ORDER — CLONIDINE HCL 0.2 MG/24HR TD PTWK
0.2000 mg | MEDICATED_PATCH | TRANSDERMAL | Status: DC
  Filled 2017-12-16: qty 1

## 2017-12-16 MED ORDER — PROMETHAZINE HCL 25 MG PO TABS
12.5000 mg | ORAL_TABLET | Freq: Four times a day (QID) | ORAL | Status: DC | PRN
  Administered 2017-12-16 (×2): 12.5 mg via ORAL
  Filled 2017-12-16 (×2): qty 1

## 2017-12-16 NOTE — ED Notes (Signed)
Pt.is a hard stick.unable to get blood

## 2017-12-16 NOTE — Progress Notes (Signed)
Patient has not been able to keep anything down.  She has vomited up her medication even when crushed in apple sauce.  MD aware.

## 2017-12-16 NOTE — Plan of Care (Signed)

## 2017-12-16 NOTE — H&P (Addendum)
Date: 12/16/2017               Patient Name:  Nancy [name] MRN: [id number]  DOB: [birth year] Age / Sex: 77 y.o., female   PCP: Aldine Contes, MD         Medical Service: Internal Medicine Teaching Service         Attending Physician: Dr. Lucious Groves, DO    First Contact: Dr. Pearson Grippe Pager: [phone]  Second Contact: Dr. Einar Gip Pager: [phone]       After Hours (After 5p/  First Contact Pager: [phone]  weekends / holidays): Second Contact Pager: [phone]   Chief Complaint: Nausea, vomiting  History of Present Illness:  Nancy Reep is a 77 yo with PMH of HTN, ESRD on HD MWF, prior CVA, and recent admission for intractable nausea/vomiting related to hemodialysis who is presenting for evaluation of nausea and emesis after dialysis. History obtained directly from the patient. The patient, who is on dialysis MWF, reports multiple episodes of non-bloody, non-bilious emesis after dialysis on the Friday prior to admission and again after dialysis on the day of presentation. Patient reports at least 4 episodes of emesis each day after dialysis. Patient states that she has intermittent epigastric pain in her upper abdomen/chest which occurs whenever she vomits. It does not bother her unless she is vomiting. No episodes of nausea, vomiting on Saturday or Sunday but does endorse feeling overall weaker during these days. Patient able to tolerate PO over the weekend but did not eat or drink anything today. Patient states she has had to cut dialysis session on Friday and day of presentation short 2/2 ongoing nausea and vomiting. Patient has not used any PRN medication for nausea because she "cannot find it". Patient, subsequently, has not used this medication prior to dialysis or whenever she feels nauseous. Patient denies fevers, chills, sick contacts, headaches, shortness of breath, and change in urine output.   Upon arrival to the ED patient was afebrile (T max 98.2), tachycardic  in 100-110s, and hypertensive to 180-200s/80s, and saturating 100% on room air. CBC with WBC 5.9, Hgb 10 (baseline), and platelets 190. BMP significant for BUN 21 and Cr 6.5, with K of 4.5. Patient's initial iSTAT troponin 0.00, with repeat 4 hours later 0.06. EKG with ST depression in II, III, aVF, V4-6 largely unchanged from prior EKG on 11/28/2014. CXR without acute cardiopulmonary process.  Meds:  Current Meds  Medication Sig  . acetaminophen (TYLENOL) 500 MG tablet Take 2 tablets (1,000 mg total) by mouth every 8 (eight) hours as needed for mild pain, moderate pain, fever or headache.  . cloNIDine (CATAPRES - DOSED IN MG/24 HR) 0.2 mg/24hr patch Place 1 patch (0.2 mg total) onto the skin every 7 (seven) days.  Marland Kitchen lidocaine-prilocaine (EMLA) cream Apply 1 application topically 3 (three) times a week. APPLY SMALL AMOUNT TO ACCESS SITE (AVF) 3 TIMES A WEEK 1 HOUR BEFORE DIALYSIS. COVER WITH OCCLUSIVE DRESSING (SARAN WRAP)  . nitroGLYCERIN (NITROSTAT) 0.3 MG SL tablet Place 1 tablet (0.3 mg total) under the tongue every 5 (five) minutes as needed for chest pain.  Marland Kitchen senna-docusate (SENOKOT-S) 8.6-50 MG tablet Take 1 tablet by mouth at bedtime as needed for mild constipation or moderate constipation.   Allergies: Allergies as of 12/15/2017 - Review Complete 12/15/2017  Allergen Reaction Noted  . Hydrocodone-acetaminophen Nausea And Vomiting and Other (See Comments)    Past Medical History: Past Medical History:  Diagnosis Date  . Anemia   .  Aortic stenosis   . Bacterial sinusitis 09/17/2011  . CHF (congestive heart failure) ([place])   . CKD (chronic kidney disease) stage 4, GFR 15-29 ml/min ([place]) 08/11/2006   Cr continues to increase. Proteinuria on UA 02/10/12.    . Colitis   . CVA (cerebrovascular accident) [facility])    New hemorrhagic per CT scan '09  . Diverticulosis of colon   . Dysfunctional uterine bleeding   . ESRD (end stage renal disease) on dialysis ([place])    "MWF; [facility]" (11/27/2017)    . Fecal impaction ([place])   . Headache(784.0)   . Heart murmur   . HERNIORRHAPHY, HX OF 08/11/2006  . Hypertension   . OA (osteoarthritis)    bilateral knees  . Postmenopausal   . Pulmonary nodule   . TINEA CRURIS 01/12/2007   Family History:  Family History  Problem Relation Age of Onset  . Hypertension Mother   . Congestive Heart Failure Mother   . Heart attack Brother 67   Social History:  Patient reports living alone. Patient states she doesn't take any medications daily. Seems to have trouble remembering/keeping track of medications. Patient denies cigarette, alcohol, and illicit drug use.   Review of Systems: A complete ROS was negative except as per HPI.   Physical Exam: Blood pressure (!) 191/86, pulse (!) 113, temperature 98.2 F (36.8 C), temperature source Oral, resp. rate 17, height 5\' 3"  (1.6 m), weight 114 lb 10.2 oz (52 kg), SpO2 100 %.  Physical Exam  Constitutional: She appears well-developed and well-nourished. No distress.  HENT:  Mouth/Throat: No oropharyngeal exudate.  Eyes: Pupils are equal, round, and reactive to light. Conjunctivae and EOM are normal. Right eye exhibits no discharge. Left eye exhibits no discharge. No scleral icterus.  Cardiovascular: Regular rhythm and intact distal pulses. Exam reveals no gallop.  Murmur (3/6 holosystolic murmur at L sternal border) heard. Tachycardia  Respiratory: Effort normal. No respiratory distress. She has no wheezes. She has no rales.  GI: Soft. Bowel sounds are normal. She exhibits no distension. There is tenderness (suprapubic region). There is no rebound.  Musculoskeletal: She exhibits no edema (of bilateral lower extremities) or tenderness (of bilateral lower extremities).  Lymphadenopathy:    She has no cervical adenopathy.  Neurological: She is alert.  Face strength and sensation intact bilaterally. Tongue midline. Gross motor and sensation to light touch of upper and lower extremities intact bilaterally.    Skin: Skin is warm and dry. No rash noted. She is not diaphoretic. No erythema.   EKG: personally reviewed my interpretation is sinus tachycardia with ST depression in II, III, aVF improved from prior EKG and minor ST depressions in V4-6 (last EKG without ST depression in V4).   CXR: personally reviewed my interpretation is no opacities, interstitial edema, vascular congestions, or pleural effusions to suggest acute cardiopulmonary process.  Assessment & Plan by Problem: Active Problems:   Nausea & vomiting  Nancy Kamen is a 77 yo with PMH of HTN, ESRD on HD MWF, prior CVA and recent admission for intractable nausea/vomiting who is presented for evaluation of nausea and emesis after dialysis. She was admitted to the internal medicine teaching service for management. The specific problems addressed during admission are as follows.  Atypical chest pain with nausea/vomiting: Patient with recurrent nausea, vomiting and epigastric pain that occurs with dialysis. Patient denies these symptoms on non-HD days, but does report feeling overall unwell after dialysis for a few days. This presentation is unlikely to be ACS, given the  timing of these symptoms with HD and the duration of these symptoms over the course of a few days. Furthermore, patient denies substernal chest pain, exertional chest pain, and shortness of breath. The symptoms are also less likely to be infectious gastritis given the timing with HD, absence of fever, leukocytosis, and sick contacts. Per chart review she was recently admitted for similar symptoms and responded well to PRN phenergan, however patient has not continued this regimen after leaving the hospital because she "couldn't find the medicine" in her house. Patient seems to be adjusting poorly to dialysis and would benefit from education regarding proper use of PRN medications.  -Admit to med-surg -Continue phenergan 12.5 mg q6 hours PRN -Serum troponin in AM to complete ACS  rule out  -Patient may benefit from home health nurse or aide to assist with medication administration/further education regarding PRN medications on discharge?  Suprapubic tenderness: Patient incidentally noted to have suprapubic tenderness with light palpation on exam. This did not previously bother patient prior to examination and resolved after examination. Patient states she still makes urine, urinates about q48 hours. Last urination this AM without dysuria, acute changes in odor. Will obtain urinalysis to rule out infection, although this in ESRD patient may be difficult to interpret.  -Urinalysis, culture if needed  ESRD on HD MWF: Patient new to dialysis within the last 9 months and seems to be having difficulty tolerating sessions 2/2 refractory atypical chest pain, nausea/vomting. Patient reports decreasing last two sessions 2/2 this constellation of symptoms. Patient appears euvolemic (more on the dry side) on exam and without acute electrolyte abnormalities to suggest need for urgent HD. Will consult nephrology if patient requires hospitalization past usual dialysis days for refractory nausea/vomiting. -Consult nephrology -Reinforce the importance of taking daily medications, including Senispar, Phoslo, Rocaltrol on discharge since patient hasn't been taking these since last leaving the hospital  Hypertension: Patient wears clonidine 0.2 mg/day and changes every Wednesday, due for change 12/17/2017. Patient reports compliance with this regimen but states she does not take pills at home. Patient to be  -Change clonidine patch today -Restart home amlodipine 5 mg daily  Hx of CVA in 2018: No focal deficits on exam. Last LDL in 2018 = 50, patient not currently on statin.  -Continue home aspirin 81 mg daily  FEN/GI: -Renal diet with 1.2L volume restriction -No IVF, replace electrolytes as needed -Senna-docusate 1 tablet at bedtime PRN  VTE Prophylaxis: Heparin TID Code Status:  DNR  Dispo: Admit patient to Observation with expected length of stay less than 2 midnights.  SignedThomasene Ripple, MD 12/16/2017, 2:09 AM  Pager: [phone]

## 2017-12-16 NOTE — Evaluation (Signed)
Physical Therapy Evaluation Patient Details Name: Nancy [name] MRN: [id number] DOB: [birth year] Today's Date: 12/16/2017   History of Present Illness  77 yo with PMH of HTN, ESRD on HD MWF, prior CVA and recent admission for intractable nausea/vomiting who is presented for evaluation of nausea and emesis after dialysis  Clinical Impression  Orders received for PT evaluation. Patient demonstrates deficits in functional mobility as indicated below. Will benefit from continued skilled PT to address deficits and maximize function. Will see as indicated and progress as tolerated.   OF NOTE: patient continues to report dizziness with activity (light headed) and some inconsistency with description of spinning. Patient with HR elevate upper 130s    Follow Up Recommendations Home health PT;Supervision for mobility/OOB    Equipment Recommendations  None recommended by PT    Recommendations for Other Services       Precautions / Restrictions Precautions Precautions: Fall Restrictions Weight Bearing Restrictions: No      Mobility  Bed Mobility Overal bed mobility: Needs Assistance Bed Mobility: Supine to Sit;Sit to Supine     Supine to sit: Supervision Sit to supine: Supervision   General bed mobility comments: Supervision for safety with bed mobility  Transfers Overall transfer level: Needs assistance Equipment used: 1 person hand held assist Transfers: Sit to/from Stand Sit to Stand: Min assist         General transfer comment: min assist for stability when coming to standing  Ambulation/Gait Ambulation/Gait assistance: Min assist Ambulation Distance (Feet): 140 Feet Assistive device: 1 person hand held assist;None Gait Pattern/deviations: Step-through pattern;Shuffle;Narrow base of support Gait velocity: decreased Gait velocity interpretation: <1.31 ft/sec, indicative of household ambulator General Gait Details: patient slow and guarded with mobility, HHA   provided  Stairs            Wheelchair Mobility    Modified Rankin (Stroke Patients Only) Modified Rankin (Stroke Patients Only) Pre-Morbid Rankin Score: No significant disability Modified Rankin: No significant disability     Balance Overall balance assessment: Needs assistance Sitting-balance support: Feet supported Sitting balance-Leahy Scale: Fair       Standing balance-Leahy Scale: Fair Standing balance comment: able to stand without assist                             Pertinent Vitals/Pain Pain Assessment: Faces Faces Pain Scale: Hurts even more Pain Location: LUE Pain Descriptors / Indicators: Sore Pain Intervention(s): Limited activity within patient's tolerance;Monitored during session    Home Living Family/patient expects to be discharged to:: Private residence Living Arrangements: Alone Available Help at Discharge: Family;Personal care attendant;Other (Comment) Type of Home: Apartment Home Access: Level entry     Home Layout: One level Home Equipment: None;Cane - single point;Grab bars - tub/shower Additional Comments: uses cane intermittently; takes tub baths    Prior Function Level of Independence: Independent         Comments: completely independent, doing all own bathing a dressing. Has assist with home mgt     Hand Dominance   Dominant Hand: Right    Extremity/Trunk Assessment   Upper Extremity Assessment Upper Extremity Assessment: Overall WFL for tasks assessed    Lower Extremity Assessment Lower Extremity Assessment: Overall WFL for tasks assessed       Communication   Communication: No difficulties  Cognition Arousal/Alertness: Awake/alert Behavior During Therapy: WFL for tasks assessed/performed  General Comments      Exercises     Assessment/Plan    PT Assessment Patient needs continued PT services  PT Problem List Decreased activity  tolerance;Decreased balance;Decreased mobility;Cardiopulmonary status limiting activity       PT Treatment Interventions DME instruction;Gait training;Functional mobility training;Therapeutic activities;Therapeutic exercise;Balance training;Neuromuscular re-education;Patient/family education    PT Goals (Current goals can be found in the Care Plan section)  Acute Rehab PT Goals Patient Stated Goal: to go home PT Goal Formulation: With patient/family Time For Goal Achievement: 12/30/17 Potential to Achieve Goals: Good    Frequency Min 3X/week   Barriers to discharge Decreased caregiver support      Co-evaluation               AM-PAC PT "6 Clicks" Daily Activity  Outcome Measure Difficulty turning over in bed (including adjusting bedclothes, sheets and blankets)?: A Little Difficulty moving from lying on back to sitting on the side of the bed? : A Little Difficulty sitting down on and standing up from a chair with arms (e.g., wheelchair, bedside commode, etc,.)?: A Little Help needed moving to and from a bed to chair (including a wheelchair)?: A Little Help needed walking in hospital room?: A Little Help needed climbing 3-5 steps with a railing? : A Little 6 Click Score: 18    End of Session   Activity Tolerance: Patient tolerated treatment well(HR did elevate to upper 130s) Patient left: in bed;with call bell/phone within reach;with bed alarm set Nurse Communication: Mobility status PT Visit Diagnosis: Unsteadiness on feet (R26.81);Dizziness and giddiness (R42)    Time: 0071-2197 PT Time Calculation (min) (ACUTE ONLY): 18 min   Charges:   PT Evaluation $PT Eval Moderate Complexity: 1 Mod     PT G Codes:        Alben Deeds, PT DPT  Board Certified Neurologic Specialist [phone]   Duncan Dull 12/16/2017, 10:53 AM

## 2017-12-16 NOTE — Progress Notes (Signed)
Patient had 4 beats of Vtac.  Called MD and he ordered a stat EKG.

## 2017-12-16 NOTE — Progress Notes (Signed)
Pt went down for Xray.

## 2017-12-16 NOTE — Care Management Note (Signed)
Case Management Note  Patient Details  Name: Nancy [name] MRN: [id number] Date of Birth: Jul 26, 1940  Subjective/Objective:    Pt in with nausea and vomiting. She is from home alone. Pt is ESRD and goes to HD MWF.               Action/Plan: PT recommending [place] services. CM following for d/c needs, physician orders.   Expected Discharge Date:  12/18/17               Expected Discharge Plan:  [place]  In-House Referral:     Discharge planning Services  CM Consult  Post Acute Care Choice:    Choice offered to:     DME Arranged:    DME Agency:     HH Arranged:    HH Agency:     Status of Service:  In process, will continue to follow  If discussed at Long Length of Stay Meetings, dates discussed:    Additional Comments:  Pollie Friar, RN 12/16/2017, 12:49 PM

## 2017-12-16 NOTE — Progress Notes (Signed)
   Subjective: Nancy [name] was seen resting in her bed this morning. She states she is still experiencing some nausea and vomiting. Her nurse states that she has been unable to keep most of her phenergan down to give it a chance. She denies any other symptoms beyond chronic back pain. She denies any constipation. Patient states that she did get her phenergan after previous admission but did not try it until after she became nauseous yesterday. She was instructed that, in the future, she should take the medication prior to dialysis to help prevent nausea.   Objective:  Vital signs in last 24 hours: Vitals:   12/16/17 0300 12/16/17 0315 12/16/17 0443 12/16/17 0813  BP: (!) 199/84 (!) 190/78 (!) 136/94 (!) 180/78  Pulse: (!) 118 (!) 120 (!) 115 (!) 118  Resp:   18 17  Temp:   98.5 F (36.9 C) 98.3 F (36.8 C)  TempSrc:   Oral Oral  SpO2: 100% 100% 100% 100%  Weight:   125 lb 14.1 oz (57.1 kg)   Height:   5\' 3"  (1.6 m)    Physical Exam  Constitutional: She is oriented to person, place, and time. She appears well-developed and well-nourished.  HENT:  Head: Normocephalic and atraumatic.  Cardiovascular: Regular rhythm and intact distal pulses.  Murmur heard. Tachycardia  Pulmonary/Chest: Effort normal and breath sounds normal. No respiratory distress.  Abdominal: Soft. Bowel sounds are normal. She exhibits no distension.  Tender to palpation suprapubic region  Musculoskeletal: She exhibits no edema or deformity.  Patent fistula with palpable thrill RUE  Neurological: She is alert and oriented to person, place, and time.  Skin: Skin is warm and dry.   Assessment/Plan: 77 yo with PMH of HTN, ESRD on HD MWF, prior CVA and recent admission for intractable nausea/vomiting who is presented for evaluation of nausea and emesis after dialysis.  Abdominal Pain: Nausea / Vomiting: Patient presented with ongoing recurrent nausea, vomiting that occurs with dialysis. Denies symptoms on non-HD days.  She was recently admited for the same and did well with phenergan. She was prescribed phenergan on discharge ad was able to pick this up, but did not try to take it until after she became nauseous yesterday and ended up throwing up the medications. - Troponin trended for atypical chest pain: Negative - Continue phenergan 12.5 mg q6 hours PRN - Instructed to take medication prophylactically on discharge - PT/OT recommends [facility] PT/OT - Referral to [facility]  Suprapubic tenderness: Patient unable to provide urine sample as she is ESRD. No symptoms beyond tenderness to palpation of suprapubic region. Labs are not suspicious for infection. - Will continue to monitor and obtain U/A if able.  Hypertension: Patient wears clonidine 0.2 mg/day and changes every Wednesday, due for change 12/17/2017. Patient reported compliance with this regimen but states she does not take pills at home. Clonidine patch changed on admission (6/11) as patient was significantly hypertensive on admission. - Continue clonidine patch and Amlodipine 5 mg daily  FEN: Reneal diet with fluid restriction VTE Prophylaxis: Heparin TID Code Status: DNR  Dispo: Anticipated discharge in approximately 0-1 day(s).   Nancy Seat, MD 12/16/2017, 11:38 AM Pager: [phone]

## 2017-12-16 NOTE — Progress Notes (Addendum)
Initial Nutrition Assessment  DOCUMENTATION CODES:   Non-severe (moderate) malnutrition in context of chronic illness  INTERVENTION:  D/c Nepro until N/V subsided Boost Breeze po TID, each supplement provides 250 kcal and 9 grams of protein RD to monitor PO intake, if N/V and PO does not improve within the week, consider post-pyloric cortrak  NUTRITION DIAGNOSIS:   Moderate Malnutrition related to chronic illness(ESRD on HD) as evidenced by energy intake < or equal to 75% for > or equal to 1 month, moderate fat depletion, severe muscle depletion.  GOAL:   Patient will meet greater than or equal to 90% of their needs  MONITOR:   PO intake, Supplement acceptance, Skin, Weight trends, Labs, I & O's  REASON FOR ASSESSMENT:   Malnutrition Screening Tool    ASSESSMENT:   77 y.o. F admitted on 12/15/17 for N/V after dialysis. PMH HTN, ESRD on HD MWF, hx of CVA, diverticulosis of colon, pulmonary nodule, OA, colitis, aortic stenosis, CHF, anemia. PSH cholecystectomy.   Pt past two admission recently for N/V and uremic encephalopathy the end of April and the end of May 2019.  EDW 59 kg per last admission. Pt weight stable since last admission, 3% below EDW; last EDW on 11/06/17 - not significant for timeframe.   Pt reports no recent weight loss. Pt reports not having an appetite due to N/V, pt reports that she is afraid to eat and the vomited 3 minutes before dietetic intern visit. Pt reports that she will typically eat 3 meals a day when she is not going to dialysis; she would have a bacon, egg, and cheese sandwich for breakfast, a sandwich for lunch, and "whatever I was fixing" for dinner; for each meal she would only eat less than a fistful. Pt reports on days she goes to dialysis (MWF) she only eats 2 meals a day. She reports this is how she ate for a while; over a month, but couldn't specify.  Pt reports outpatient RD did not mention any concerns. Discussed with pt some ways to  optimize intake; eating protein foods first, adding high calorie foods to foods she is already eating, adding supplementation, and eating small frequent meals. Emphasized the importance of eating enough protein while on dialysis.   If pt PO intake does not improve, pt is open to receiving nutrition through TF.   Medications reviewed: norvasc, rocaltrol, phoslo, sensipar, catapres, nepro BID, heparin, miralax, phenergan.   Labs reviewed: BUN 21 (H), creatinine 6.52 (H), AST 13 (L), ALT 8 (L).   NUTRITION - FOCUSED PHYSICAL EXAM:  Suspect some wasting due to age related sarcopenia.  Couldn't complete NFPE facial exam and review of eyes and mouth due to pt N/V.     Most Recent Value  Orbital Region  Unable to assess  Upper Arm Region  Moderate depletion  Thoracic and Lumbar Region  Moderate depletion  Buccal Region  Unable to assess  Temple Region  No depletion  Clavicle Bone Region  Moderate depletion  Clavicle and Acromion Bone Region  Moderate depletion  Scapular Bone Region  Unable to assess  Dorsal Hand  Moderate depletion  Patellar Region  Severe depletion  Anterior Thigh Region  Severe depletion  Posterior Calf Region  Severe depletion  Edema (RD Assessment)  None  Hair  Reviewed  Eyes  Unable to assess  Mouth  Unable to assess  Skin  Reviewed  Nails  Reviewed       Diet Order:   Diet Order  Diet renal with fluid restriction Fluid restriction: 1200 mL Fluid; Room service appropriate? Yes; Fluid consistency: Thin  Diet effective now          EDUCATION NEEDS:   Education needs have been addressed  Skin:  Skin Assessment: Reviewed RN Assessment  Last BM:  unknown  Height:   Ht Readings from Last 1 Encounters:  12/16/17 5\' 3"  (1.6 m)    Weight:   Wt Readings from Last 1 Encounters:  12/16/17 125 lb 14.1 oz (57.1 kg)    Ideal Body Weight:  52.2 kg  BMI:  Body mass index is 22.3 kg/m.  Estimated Nutritional Needs:   Kcal:  1600-1800  kcal  Protein:  80-95 g  Fluid:  1.2 L or per MD  Hope Budds, Dietetic Intern

## 2017-12-16 NOTE — Evaluation (Signed)
Occupational Therapy Evaluation Patient Details Name: Nancy [name] MRN: [id number] DOB: 1941-06-06 Today's Date: 12/16/2017    History of Present Illness 77 yo with PMH of HTN, ESRD on HD MWF, prior CVA and recent admission for intractable nausea/vomiting who is presented for evaluation of nausea and emesis after dialysis   Clinical Impression   Pt with decline in function and safety with ADLs and ADL mobility with decreased strength, balance and endurance with min A for LB ADLs and transfers. Pt with slow, guarded pace of movement to bathroom. Pt with HR in 130s with min - mod exertion/activity. Pt would benefit from acute OT services to address impairments to maximize level of function and safety    Follow Up Recommendations  Home health OT;Supervision - Intermittent    Equipment Recommendations  None recommended by OT    Recommendations for Other Services       Precautions / Restrictions Precautions Precautions: Fall Restrictions Weight Bearing Restrictions: No      Mobility Bed Mobility Overal bed mobility: Needs Assistance Bed Mobility: Supine to Sit;Sit to Supine     Supine to sit: Supervision Sit to supine: Supervision   General bed mobility comments: Supervision for safety with bed mobility  Transfers Overall transfer level: Needs assistance Equipment used: 1 person hand held assist Transfers: Sit to/from Stand Sit to Stand: Min assist         General transfer comment: min assist for stability when coming to standing    Balance Overall balance assessment: Needs assistance Sitting-balance support: Feet supported Sitting balance-Leahy Scale: Fair       Standing balance-Leahy Scale: Fair Standing balance comment: able to stand without assist                           ADL either performed or assessed with clinical judgement   ADL Overall ADL's : Needs assistance/impaired     Grooming: Min guard;Sitting   Upper Body Bathing: Set  up;Supervision/ safety   Lower Body Bathing: Minimal assistance   Upper Body Dressing : Set up;Supervision/safety   Lower Body Dressing: Minimal assistance   Toilet Transfer: Minimal assistance   Toileting- Clothing Manipulation and Hygiene: Minimal assistance       Functional mobility during ADLs: Minimal assistance General ADL Comments: pt has tub shower at home and takes baths     Vision Baseline Vision/History: Wears glasses Wears Glasses: Reading only Patient Visual Report: No change from baseline       Perception     Praxis      Pertinent Vitals/Pain Pain Assessment: 0-10 Pain Score: 6  Faces Pain Scale: Hurts even more Pain Location: LUE Pain Descriptors / Indicators: Sore Pain Intervention(s): Limited activity within patient's tolerance;Monitored during session     Hand Dominance Right   Extremity/Trunk Assessment Upper Extremity Assessment Upper Extremity Assessment: Generalized weakness   Lower Extremity Assessment Lower Extremity Assessment: Defer to PT evaluation   Cervical / Trunk Assessment Cervical / Trunk Assessment: Normal   Communication Communication Communication: No difficulties   Cognition Arousal/Alertness: Awake/alert Behavior During Therapy: WFL for tasks assessed/performed Overall Cognitive Status: Within Functional Limits for tasks assessed                                     General Comments       Exercises     Shoulder Instructions  Home Living Family/patient expects to be discharged to:: Private residence Living Arrangements: Alone Available Help at Discharge: Family;Personal care attendant;Other (Comment) Type of Home: Apartment Home Access: Level entry     Home Layout: One level     Bathroom Shower/Tub: (pt takes baths in tub)   Bathroom Toilet: Standard     Home Equipment: None;Cane - single point;Grab bars - tub/shower   Additional Comments: uses cane intermittently; takes tub  baths      Prior Functioning/Environment Level of Independence: Independent        Comments: completely independent, doing all own bathing a dressing. Has assist with home mgt        OT Problem List: Decreased strength;Decreased activity tolerance;Decreased knowledge of use of DME or AE;Impaired balance (sitting and/or standing)      OT Treatment/Interventions: Self-care/ADL training;DME and/or AE instruction;Therapeutic activities;Therapeutic exercise;Balance training;Neuromuscular education;Patient/family education    OT Goals(Current goals can be found in the care plan section) Acute Rehab OT Goals Patient Stated Goal: to go home OT Goal Formulation: With patient/family Time For Goal Achievement: 12/30/17 Potential to Achieve Goals: Good ADL Goals Pt Will Perform Grooming: with supervision;with set-up;standing Pt Will Perform Upper Body Bathing: with set-up Pt Will Perform Lower Body Bathing: with min guard assist;sitting/lateral leans;sit to/from stand Pt Will Perform Upper Body Dressing: with set-up Pt Will Perform Lower Body Dressing: with min guard assist;sitting/lateral leans;sit to/from stand Pt Will Transfer to Toilet: with min guard assist;with supervision;ambulating;regular height toilet;grab bars Pt Will Perform Toileting - Clothing Manipulation and hygiene: with min guard assist;sit to/from stand  OT Frequency: Min 2X/week   Barriers to D/C: Decreased caregiver support          Co-evaluation              AM-PAC PT "6 Clicks" Daily Activity     Outcome Measure Help from another person eating meals?: None Help from another person taking care of personal grooming?: A Little Help from another person toileting, which includes using toliet, bedpan, or urinal?: A Little Help from another person bathing (including washing, rinsing, drying)?: A Little Help from another person to put on and taking off regular upper body clothing?: A Little Help from another  person to put on and taking off regular lower body clothing?: A Little 6 Click Score: 19   End of Session Equipment Utilized During Treatment: Gait belt  Activity Tolerance: Patient tolerated treatment well Patient left: in bed;with call bell/phone within reach;with bed alarm set  OT Visit Diagnosis: Unsteadiness on feet (R26.81);Dizziness and giddiness (R42);Muscle weakness (generalized) (M62.81)                Time: 6503-5465 OT Time Calculation (min): 25 min Charges:  OT General Charges $OT Visit: 1 Visit OT Evaluation $OT Eval Moderate Complexity: 1 Mod G-Codes: OT G-codes **NOT FOR INPATIENT CLASS** Functional Assessment Tool Used: AM-PAC 6 Clicks Daily Activity     [name] 12/16/2017, 1:43 PM

## 2017-12-17 ENCOUNTER — Observation Stay (HOSPITAL_COMMUNITY): Payer: Medicare Other

## 2017-12-17 LAB — RENAL FUNCTION PANEL
ALBUMIN: 3.3 g/dL — AB (ref 3.5–5.0)
Anion gap: 19 — ABNORMAL HIGH (ref 5–15)
BUN: 38 mg/dL — AB (ref 6–20)
CHLORIDE: 94 mmol/L — AB (ref 101–111)
CO2: 28 mmol/L (ref 22–32)
CREATININE: 8.9 mg/dL — AB (ref 0.44–1.00)
Calcium: 9.7 mg/dL (ref 8.9–10.3)
GFR calc Af Amer: 4 mL/min — ABNORMAL LOW (ref >60)
GFR, EST NON AFRICAN AMERICAN: 4 mL/min — AB (ref >60)
Glucose, Bld: 107 mg/dL — ABNORMAL HIGH (ref 65–99)
PHOSPHORUS: 6.7 mg/dL — AB (ref 2.5–4.6)
POTASSIUM: 3.7 mmol/L (ref 3.5–5.1)
Sodium: 141 mmol/L (ref 135–145)

## 2017-12-17 MED ORDER — GI COCKTAIL ~~LOC~~
30.0000 mL | Freq: Three times a day (TID) | ORAL | Status: DC | PRN
  Administered 2017-12-17: 30 mL via ORAL
  Filled 2017-12-17: qty 30

## 2017-12-17 MED ORDER — CALCITRIOL 0.5 MCG PO CAPS
ORAL_CAPSULE | ORAL | Status: AC
  Administered 2017-12-17: 2 ug via ORAL
  Filled 2017-12-17: qty 4

## 2017-12-17 NOTE — Progress Notes (Signed)
   Subjective: Nancy [name] was seen laying in her bed this morning. The IV phenergan did help yesterday, but patient states she is feeling some nausea this morning again. She otherwise denies any new symptoms. She has no pain without palpation. She was informed that she should be getting phenergan prior to dialysis today to see if this helps (and to ask for it if she does not). She was informed we would also be looking at the blood flow in her abdomen with an ultrasound.   Objective:  Vital signs in last 24 hours: Vitals:   12/16/17 1553 12/17/17 0121 12/17/17 0300 12/17/17 0321  BP: (!) 176/77     Pulse: (!) 116     Resp: 20     Temp: 98.2 F (36.8 C) 98.5 F (36.9 C)  99.1 F (37.3 C)  TempSrc: Axillary Oral    SpO2: 100%     Weight:   127 lb 10.3 oz (57.9 kg)   Height:       Physical Exam  Constitutional: She is oriented to person, place, and time. She appears well-developed and well-nourished.  HENT:  Head: Normocephalic and atraumatic.  Cardiovascular: Regular rhythm and intact distal pulses.  Murmur heard. Tachycardia  Pulmonary/Chest: Effort normal and breath sounds normal. No respiratory distress.  Abdominal: Soft. Bowel sounds are normal. She exhibits no distension.  Tender to palpation suprapubic region and RLQ  Musculoskeletal: She exhibits no edema or deformity.  Patent fistula with palpable thrill LUE  Neurological: She is alert and oriented to person, place, and time.  Skin: Skin is warm and dry.   Assessment/Plan: 77 yo with PMH of HTN, ESRD on HD MWF, prior CVA and recent admission for intractable nausea/vomiting who is presented for evaluation of nausea and emesis after dialysis.  Abdominal Pain: Nausea / Vomiting: Patient presented with ongoing recurrent nausea, vomiting that occurs with dialysis. Denies symptoms on non-HD days. She was recently admited for the same and did well with phenergan. She was prescribed phenergan on discharge and was able to pick this  up, but did not try to take it until after she became nauseous yesterday and ended up throwing up the medications. Will evaluate for non occlusive messenteric ischemia with mesenteric doppler given association with dialysis and concurrent abdominal pain. > PT/OT recommends HH PT/OT > Troponin trended for atypical chest pain: Negative - Continue phenergan 12.5 mg q6 hours PRN - Mesenteric dopplers - Instructed to take medication prophylactically on discharge - Will benefit from [facility] referral  Suprapubic tenderness: Patient unable to provide urine sample as she is ESRD. No symptoms beyond tenderness to palpation of suprapubic region. Labs are not suspicious for infection. - Will continue to monitor and obtain U/A if able.  Hypertension: Patient wears clonidine 0.2 mg/day and changes every Wednesday, due for change 12/17/2017. Patient reported compliance with this regimen but states she does not take pills at home. Clonidine patch changed on admission (6/11) as patient was significantly hypertensive on admission. - Continue clonidine patch and Amlodipine 5 mg daily - Expect improvement in BP with HD and want to avoid hypotension with eval for non-occlusive mesenteric ischemia as above.  FEN: Reneal diet with fluid restriction VTE Prophylaxis: Heparin TID Code Status: DNR  Dispo: Anticipated discharge in approximately 0-1 day(s).   Neva Seat, MD 12/17/2017, 7:12 AM Pager: [phone]

## 2017-12-17 NOTE — Progress Notes (Signed)
[facility]  Patient Status: Active (receiving services up to time of hospitalization)  [facility] is providing the following services: RN  If patient discharges after hours, please call [phone].   Janae Sauce 12/17/2017, 4:32 PM

## 2017-12-17 NOTE — Procedures (Signed)
Seen on HD, no c/o, still nauseous. UF goal is 1 L net.  BP's good.   I was present at this dialysis session, have reviewed the session itself and made  appropriate changes Kelly Splinter MD [place] pager [phone]   12/17/2017, 10:58 AM

## 2017-12-17 NOTE — Progress Notes (Signed)
Pt c/o chest pain said it feels like indigestion. BP 170/78 Paged MD see orders.

## 2017-12-17 NOTE — Progress Notes (Signed)
Tele called to notify Pt has 6 beats of VT. Notified Nedrud.

## 2017-12-18 ENCOUNTER — Observation Stay (HOSPITAL_BASED_OUTPATIENT_CLINIC_OR_DEPARTMENT_OTHER): Payer: Medicare Other

## 2017-12-18 DIAGNOSIS — M1990 Unspecified osteoarthritis, unspecified site: Secondary | ICD-10-CM | POA: Diagnosis present

## 2017-12-18 DIAGNOSIS — I160 Hypertensive urgency: Secondary | ICD-10-CM | POA: Diagnosis not present

## 2017-12-18 DIAGNOSIS — I951 Orthostatic hypotension: Secondary | ICD-10-CM | POA: Diagnosis not present

## 2017-12-18 DIAGNOSIS — Z66 Do not resuscitate: Secondary | ICD-10-CM | POA: Diagnosis present

## 2017-12-18 DIAGNOSIS — D631 Anemia in chronic kidney disease: Secondary | ICD-10-CM | POA: Diagnosis present

## 2017-12-18 DIAGNOSIS — I10 Essential (primary) hypertension: Secondary | ICD-10-CM | POA: Diagnosis not present

## 2017-12-18 DIAGNOSIS — I350 Nonrheumatic aortic (valve) stenosis: Secondary | ICD-10-CM | POA: Diagnosis present

## 2017-12-18 DIAGNOSIS — R400 Somnolence: Secondary | ICD-10-CM | POA: Diagnosis not present

## 2017-12-18 DIAGNOSIS — Z7982 Long term (current) use of aspirin: Secondary | ICD-10-CM | POA: Diagnosis not present

## 2017-12-18 DIAGNOSIS — Z79899 Other long term (current) drug therapy: Secondary | ICD-10-CM | POA: Diagnosis not present

## 2017-12-18 DIAGNOSIS — I132 Hypertensive heart and chronic kidney disease with heart failure and with stage 5 chronic kidney disease, or end stage renal disease: Secondary | ICD-10-CM | POA: Diagnosis present

## 2017-12-18 DIAGNOSIS — R10819 Abdominal tenderness, unspecified site: Secondary | ICD-10-CM | POA: Diagnosis present

## 2017-12-18 DIAGNOSIS — N186 End stage renal disease: Secondary | ICD-10-CM | POA: Diagnosis not present

## 2017-12-18 DIAGNOSIS — Z992 Dependence on renal dialysis: Secondary | ICD-10-CM | POA: Diagnosis not present

## 2017-12-18 DIAGNOSIS — R0789 Other chest pain: Secondary | ICD-10-CM | POA: Diagnosis present

## 2017-12-18 DIAGNOSIS — R112 Nausea with vomiting, unspecified: Secondary | ICD-10-CM | POA: Diagnosis not present

## 2017-12-18 DIAGNOSIS — I509 Heart failure, unspecified: Secondary | ICD-10-CM | POA: Diagnosis present

## 2017-12-18 DIAGNOSIS — Z885 Allergy status to narcotic agent status: Secondary | ICD-10-CM | POA: Diagnosis not present

## 2017-12-18 DIAGNOSIS — Z8673 Personal history of transient ischemic attack (TIA), and cerebral infarction without residual deficits: Secondary | ICD-10-CM | POA: Diagnosis not present

## 2017-12-18 DIAGNOSIS — I120 Hypertensive chronic kidney disease with stage 5 chronic kidney disease or end stage renal disease: Secondary | ICD-10-CM | POA: Diagnosis not present

## 2017-12-18 LAB — COMPREHENSIVE METABOLIC PANEL
ALBUMIN: 2.9 g/dL — AB (ref 3.5–5.0)
ALK PHOS: 47 U/L (ref 38–126)
ALT: 7 U/L — AB (ref 14–54)
AST: 13 U/L — AB (ref 15–41)
Anion gap: 13 (ref 5–15)
BUN: 19 mg/dL (ref 6–20)
CALCIUM: 9.1 mg/dL (ref 8.9–10.3)
CHLORIDE: 96 mmol/L — AB (ref 101–111)
CO2: 29 mmol/L (ref 22–32)
CREATININE: 5.24 mg/dL — AB (ref 0.44–1.00)
GFR calc Af Amer: 8 mL/min — ABNORMAL LOW (ref >60)
GFR calc non Af Amer: 7 mL/min — ABNORMAL LOW (ref >60)
GLUCOSE: 86 mg/dL (ref 65–99)
Potassium: 4.3 mmol/L (ref 3.5–5.1)
SODIUM: 138 mmol/L (ref 135–145)
Total Bilirubin: 0.6 mg/dL (ref 0.3–1.2)
Total Protein: 5.7 g/dL — ABNORMAL LOW (ref 6.5–8.1)

## 2017-12-18 MED ORDER — LIDOCAINE-PRILOCAINE 2.5-2.5 % EX CREA
1.0000 "application " | TOPICAL_CREAM | CUTANEOUS | Status: DC | PRN
  Filled 2017-12-18: qty 5

## 2017-12-18 MED ORDER — PROMETHAZINE HCL 25 MG PO TABS
12.5000 mg | ORAL_TABLET | ORAL | Status: DC | PRN
  Filled 2017-12-18: qty 1

## 2017-12-18 MED ORDER — HEPARIN SODIUM (PORCINE) 1000 UNIT/ML DIALYSIS
3000.0000 [IU] | Freq: Once | INTRAMUSCULAR | Status: DC
  Filled 2017-12-18: qty 3

## 2017-12-18 MED ORDER — SODIUM CHLORIDE 0.9 % IV SOLN: 100.0000 mL | INTRAVENOUS | Status: DC | PRN

## 2017-12-18 MED ORDER — ALTEPLASE 2 MG IJ SOLR
2.0000 mg | Freq: Once | INTRAMUSCULAR | Status: DC | PRN
  Filled 2017-12-18: qty 2

## 2017-12-18 MED ORDER — HEPARIN SODIUM (PORCINE) 1000 UNIT/ML DIALYSIS
1000.0000 [IU] | INTRAMUSCULAR | Status: DC | PRN
  Filled 2017-12-18: qty 1

## 2017-12-18 MED ORDER — PENTAFLUOROPROP-TETRAFLUOROETH EX AERO: 1.0000 "application " | INHALATION_SPRAY | CUTANEOUS | Status: DC | PRN

## 2017-12-18 MED ORDER — FERRIC CITRATE 1 GM 210 MG(FE) PO TABS
210.0000 mg | ORAL_TABLET | Freq: Three times a day (TID) | ORAL | Status: DC
  Administered 2017-12-18 – 2017-12-19 (×3): 210 mg via ORAL
  Filled 2017-12-18 (×5): qty 1

## 2017-12-18 MED ORDER — LIDOCAINE HCL (PF) 1 % IJ SOLN: 5.0000 mL | INTRAMUSCULAR | Status: DC | PRN

## 2017-12-18 NOTE — Progress Notes (Signed)
Preliminary results by tech - Mesenteric duplex completed. No evidence of a significant stenosis in the celiac artery and SMA. The IMA was not clearly visualized. Oda Cogan, BS, RDMS, RVT

## 2017-12-18 NOTE — Progress Notes (Signed)
Physical Therapy Treatment Patient Details Name: Nancy [name] MRN: [id number] DOB: 11/09/1940 Today's Date: 12/18/2017    History of Present Illness 77 yo with PMH of HTN, ESRD on HD MWF, prior CVA and recent admission for intractable nausea/vomiting who is presented for evaluation of nausea and emesis after dialysis    PT Comments    Patient is making progress toward PT goals. Pt required supervision for bed mobility and min guard for transfers and ambulation with use of SPC. Pt continues to present with unsteady gait but no overt LOB. Pt continues to c/o dizziness/nausea. Current plan remains appropriate.     Follow Up Recommendations  Home health PT;Supervision for mobility/OOB     Equipment Recommendations  None recommended by PT    Recommendations for Other Services       Precautions / Restrictions Precautions Precautions: Fall Restrictions Weight Bearing Restrictions: No    Mobility  Bed Mobility Overal bed mobility: Needs Assistance Bed Mobility: Supine to Sit;Sit to Supine     Supine to sit: Supervision Sit to supine: Supervision   General bed mobility comments: supervision for safety; pt had difficulty bringing bilat LE into bed and needs increased time but no physical assist required  Transfers Overall transfer level: Needs assistance Equipment used: None Transfers: Sit to/from Stand Sit to Stand: Min guard         General transfer comment: min guard for safety  Ambulation/Gait Ambulation/Gait assistance: Min guard Ambulation Distance (Feet): 150 Feet Assistive device: Straight cane Gait Pattern/deviations: Step-through pattern;Shuffle;Narrow base of support;Drifts right/left Gait velocity: decreased   General Gait Details: pt with decreased cadence and guarded movements; no overt LOB but mildly unsteady    Stairs             Wheelchair Mobility    Modified Rankin (Stroke Patients Only) Modified Rankin (Stroke Patients  Only) Pre-Morbid Rankin Score: No significant disability Modified Rankin: No significant disability     Balance Overall balance assessment: Needs assistance Sitting-balance support: Feet supported Sitting balance-Leahy Scale: Good       Standing balance-Leahy Scale: Fair Standing balance comment: able to stand without assist                            Cognition Arousal/Alertness: Awake/alert Behavior During Therapy: WFL for tasks assessed/performed Overall Cognitive Status: Within Functional Limits for tasks assessed                                        Exercises      General Comments General comments (skin integrity, edema, etc.): pt c/o dizziness and nausea during session and with minimal use of R UE due to c/o pain at IV site-RN notified      Pertinent Vitals/Pain Pain Assessment: Faces Faces Pain Scale: Hurts little more Pain Location: bilat UE (R UE IV site) Pain Descriptors / Indicators: Sore Pain Intervention(s): Monitored during session;Repositioned    Home Living                      Prior Function            PT Goals (current goals can now be found in the care plan section) Acute Rehab PT Goals Patient Stated Goal: to go home PT Goal Formulation: With patient/family Time For Goal Achievement: 12/30/17 Potential to Achieve Goals: Good Progress towards  PT goals: Progressing toward goals    Frequency    Min 3X/week      PT Plan Current plan remains appropriate    Co-evaluation              AM-PAC PT "6 Clicks" Daily Activity  Outcome Measure  Difficulty turning over in bed (including adjusting bedclothes, sheets and blankets)?: A Little Difficulty moving from lying on back to sitting on the side of the bed? : A Little Difficulty sitting down on and standing up from a chair with arms (e.g., wheelchair, bedside commode, etc,.)?: A Little Help needed moving to and from a bed to chair (including a  wheelchair)?: A Little Help needed walking in hospital room?: A Little Help needed climbing 3-5 steps with a railing? : A Little 6 Click Score: 18    End of Session Equipment Utilized During Treatment: Gait belt Activity Tolerance: Patient tolerated treatment well Patient left: in bed;with call bell/phone within reach;with bed alarm set Nurse Communication: Mobility status PT Visit Diagnosis: Unsteadiness on feet (R26.81);Dizziness and giddiness (R42)     Time: 1941-7408 PT Time Calculation (min) (ACUTE ONLY): 25 min  Charges:  $Gait Training: 8-22 mins $Therapeutic Activity: 8-22 mins                    G Codes:       Earney Navy, PTA Pager: [phone]     Darliss Cheney 12/18/2017, 9:21 AM

## 2017-12-18 NOTE — Progress Notes (Signed)
Subjective: Nancy [name] was seen sleeping in her bed this morning. She was difficult to rouse and would says short answer to one question only opening her eyes briefly and then fall back asleep. Nurse states she did have some confusion overnight, thinking it was Sunday and she needed to got to church; but is not sure how much she slept overnight. Nurse also state that patient seemed to get more tired following administration of her phenergan. Her nausea has improved with phenergan; only one episode overnight. Mesenteric doppler procedure to be performed this morning. She still has not had her messenteric doppler, but this is scheduled.  Objective:  Vital signs in last 24 hours: Vitals:   12/17/17 2005 12/17/17 2337 12/18/17 0348 12/18/17 0744  BP: (!) 148/67 (!) 157/76 (!) 171/77 (!) 160/69  Pulse: (!) 108 (!) 110 (!) 107 (!) 102  Resp: 16 18 18 18   Temp: 98.1 F (36.7 C) 98 F (36.7 C) 98.3 F (36.8 C) 98.3 F (36.8 C)  TempSrc: Oral Oral Oral Oral  SpO2: 98% 97% 100% 100%  Weight:      Height:       Physical Exam  Constitutional: She appears well-developed and well-nourished.  HENT:  Head: Normocephalic and atraumatic.  Cardiovascular: Normal rate, regular rhythm and intact distal pulses.  Murmur heard. Pulmonary/Chest: Effort normal and breath sounds normal. No respiratory distress.  Abdominal: Soft. Bowel sounds are normal. She exhibits no distension.  Tender to palpation of lower abdomen  Musculoskeletal: She exhibits no edema or deformity.  Patent fistula with palpable thrill LUE  Neurological:  Lethargic, sleeping Difficult to arouse this morning, would answer with one word and go back to sleep  Skin: Skin is warm and dry.   Assessment/Plan: 77 yo with PMH of HTN, ESRD on HD MWF, prior CVA and recent admission for intractable nausea/vomiting who is presented for evaluation of nausea and emesis after dialysis.  Abdominal Pain: Nausea / Vomiting: Patient presented with  ongoing recurrent nausea, vomiting that occurs with dialysis. Denies symptoms on non-HD days. She was recently admited for the same and did well with phenergan. She was prescribed phenergan on discharge and was able to pick this up, but did not try to take it until after she became nauseous and ended up throwing up the medication. > Evaluating for non occlusive messenteric ischemia with mesenteric doppler given association with dialysis and concurrent abdominal pain. > PT/OT recommends HH PT/OT > Troponin trended for atypical chest pain: Negative - Phenergan 12.5mg  PO q4h PRN - Mesenteric dopplers, this has apparently been delayed due to needing to be NPO per nursing - Instructed to take medication prophylactically on discharge - Will benefit from [facility] referral  Somnolence: Difficult to rouse AM of 6/13 and would says short answer to one question only opening her eyes briefly and then fall back asleep. Nurse states she did have some confusion overnight, thinking it was Sunday and she needed to got to church; but is not sure how much she slept overnight. Nurse also state that patient seemed to get more tired following administration of her phenergan. - Will check CMP and reevaluate in the afternoon  Abdominal tenderness: Patient unable to provide urine sample as she is ESRD. No symptoms beyond tenderness to palpation of suprapubic region. Labs are not suspicious for infection. > Did not appear tender during POCUS exam of bladder, which showed decompressed bladder - Will continue to monitor and obtain U/A if able.  Hypertension: Patient wears clonidine 0.2 mg/day  and changes every Wednesday, due for change 12/17/2017. Patient reported compliance with this regimen but states she does not take pills at home. Clonidine patch changed on admission (6/11) as patient was significantly hypertensive on admission. - BP 885O-277A systolic past 24 hrs - Continue clonidine patch and Amlodipine 5 mg daily - Want to  avoid hypotension with eval for non-occlusive mesenteric ischemia as above.  FEN: Reneal diet with fluid restriction, NPO for doppler VTE Prophylaxis: Heparin TID Code Status: DNR  Dispo: Anticipated discharge in approximately 0-1 day(s).   Neva Seat, MD 12/18/2017, 11:21 AM Pager: [phone]

## 2017-12-18 NOTE — Progress Notes (Signed)
[facility] Brief Note  Nancy [name] is 77 year old female with ESRD on HD MWF at [facility]. She was admitted under observation status with intractable nausea/vomiting. Per notes cumesenteric doppler study pending.   Received dialysis here on Wednesday and due for routine dialysis again Friday.   Seen at bedside. She reports continued nausea/vomiting with food. Hasn't been able to tolerate anything PO today.    Dialysis Orders: [facility] M[facility] 4h 180NRe BFR 300/800 EDW 57.5kg 2K/2.5Ca  L AVF Heparin bolus 3000 U Mircera 110mcg IV q 2 weeks (last 6/5) Sensipar 60mg  PO TIW Calcitriol 2.79mcg PO TIW  Auryxia 210mg  qac TID    Plan:  Continue HD MWF. Orders written for HD tomorrow on schedule  Continue calcitriol, sensipar, binders as tolerated    Lynnda Child PA-C [place] Pager [phone] 12/18/2017,4:01 PM  LOS: 0 days

## 2017-12-19 ENCOUNTER — Telehealth: Payer: Self-pay | Admitting: Internal Medicine

## 2017-12-19 DIAGNOSIS — I120 Hypertensive chronic kidney disease with stage 5 chronic kidney disease or end stage renal disease: Secondary | ICD-10-CM

## 2017-12-19 LAB — GLUCOSE, CAPILLARY
Glucose-Capillary: 67 mg/dL (ref 65–99)
Glucose-Capillary: 71 mg/dL (ref 65–99)

## 2017-12-19 LAB — CBC
HEMATOCRIT: 29.4 % — AB (ref 36.0–46.0)
Hemoglobin: 8.8 g/dL — ABNORMAL LOW (ref 12.0–15.0)
MCH: 30.9 pg (ref 26.0–34.0)
MCHC: 29.9 g/dL — ABNORMAL LOW (ref 30.0–36.0)
MCV: 103.2 fL — ABNORMAL HIGH (ref 78.0–100.0)
PLATELETS: 176 10*3/uL (ref 150–400)
RBC: 2.85 MIL/uL — AB (ref 3.87–5.11)
RDW: 16.9 % — AB (ref 11.5–15.5)
WBC: 5.2 10*3/uL (ref 4.0–10.5)

## 2017-12-19 LAB — RENAL FUNCTION PANEL
Albumin: 2.9 g/dL — ABNORMAL LOW (ref 3.5–5.0)
Anion gap: 10 (ref 5–15)
BUN: 31 mg/dL — ABNORMAL HIGH (ref 6–20)
CO2: 29 mmol/L (ref 22–32)
Calcium: 8.5 mg/dL — ABNORMAL LOW (ref 8.9–10.3)
Chloride: 98 mmol/L — ABNORMAL LOW (ref 101–111)
Creatinine, Ser: 6.84 mg/dL — ABNORMAL HIGH (ref 0.44–1.00)
GFR calc Af Amer: 6 mL/min — ABNORMAL LOW (ref >60)
GFR calc non Af Amer: 5 mL/min — ABNORMAL LOW (ref >60)
Glucose, Bld: 73 mg/dL (ref 65–99)
Phosphorus: 4.6 mg/dL (ref 2.5–4.6)
Potassium: 4.4 mmol/L (ref 3.5–5.1)
Sodium: 137 mmol/L (ref 135–145)

## 2017-12-19 MED ORDER — PROMETHAZINE HCL 25 MG PO TABS
12.5000 mg | ORAL_TABLET | Freq: Every day | ORAL | Status: DC | PRN
  Administered 2017-12-19: 12.5 mg via ORAL

## 2017-12-19 MED ORDER — ALTEPLASE 2 MG IJ SOLR
2.0000 mg | Freq: Once | INTRAMUSCULAR | Status: DC | PRN
  Filled 2017-12-19: qty 2

## 2017-12-19 MED ORDER — LIDOCAINE HCL (PF) 1 % IJ SOLN: 5.0000 mL | INTRAMUSCULAR | Status: DC | PRN

## 2017-12-19 MED ORDER — PENTAFLUOROPROP-TETRAFLUOROETH EX AERO: 1.0000 "application " | INHALATION_SPRAY | CUTANEOUS | Status: DC | PRN

## 2017-12-19 MED ORDER — HEPARIN SODIUM (PORCINE) 1000 UNIT/ML DIALYSIS
20.0000 [IU]/kg | INTRAMUSCULAR | Status: DC | PRN
  Filled 2017-12-19: qty 2

## 2017-12-19 MED ORDER — PROMETHAZINE HCL 25 MG/ML IJ SOLN
12.5000 mg | Freq: Once | INTRAMUSCULAR | Status: DC
  Filled 2017-12-19: qty 1

## 2017-12-19 MED ORDER — SODIUM CHLORIDE 0.9 % IV SOLN: 100.0000 mL | INTRAVENOUS | Status: DC | PRN

## 2017-12-19 MED ORDER — DARBEPOETIN ALFA 200 MCG/0.4ML IJ SOSY
PREFILLED_SYRINGE | INTRAMUSCULAR | Status: AC
  Administered 2017-12-19: 200 ug via INTRAVENOUS
  Filled 2017-12-19: qty 0.4

## 2017-12-19 MED ORDER — CALCITRIOL 0.5 MCG PO CAPS
ORAL_CAPSULE | ORAL | Status: AC
  Filled 2017-12-19: qty 4

## 2017-12-19 MED ORDER — PROMETHAZINE HCL 12.5 MG PO TABS: 12.5000 mg | ORAL_TABLET | Freq: Four times a day (QID) | ORAL | 0 refills | Status: DC | PRN

## 2017-12-19 MED ORDER — SENNOSIDES-DOCUSATE SODIUM 8.6-50 MG PO TABS: 1.0000 | ORAL_TABLET | Freq: Every evening | ORAL | 0 refills | Status: DC | PRN

## 2017-12-19 MED ORDER — HEPARIN SODIUM (PORCINE) 1000 UNIT/ML DIALYSIS
1000.0000 [IU] | INTRAMUSCULAR | Status: DC | PRN
  Filled 2017-12-19: qty 1

## 2017-12-19 MED ORDER — SODIUM CHLORIDE 0.9 % IV BOLUS
250.0000 mL | Freq: Once | INTRAVENOUS | Status: AC
  Administered 2017-12-19: 250 mL via INTRAVENOUS

## 2017-12-19 MED ORDER — SODIUM CHLORIDE 0.9 % IV BOLUS
500.0000 mL | Freq: Once | INTRAVENOUS | Status: AC
  Administered 2017-12-19: 500 mL via INTRAVENOUS

## 2017-12-19 MED ORDER — CALCITRIOL 0.25 MCG PO CAPS
ORAL_CAPSULE | ORAL | Status: AC
  Administered 2017-12-19: 2 ug via ORAL
  Filled 2017-12-19: qty 4

## 2017-12-19 MED ORDER — LIDOCAINE-PRILOCAINE 2.5-2.5 % EX CREA
1.0000 "application " | TOPICAL_CREAM | CUTANEOUS | Status: DC | PRN
  Filled 2017-12-19: qty 5

## 2017-12-19 MED ORDER — DARBEPOETIN ALFA 200 MCG/0.4ML IJ SOSY
200.0000 ug | PREFILLED_SYRINGE | INTRAMUSCULAR | Status: DC
  Administered 2017-12-19: 200 ug via INTRAVENOUS

## 2017-12-19 NOTE — Progress Notes (Signed)
   Subjective: Ms Wiegert was seen during her dialysis session this morning. She states she feels well this morning and denies any nausea. The need to take her phenergan prior to dialysis was reiterated. She denies lightheadedness or weakness. She states she has not had a bowel movement in several days, but has had decreased PO intake when she was nauseous. Patient has no further questions or concerns.  Objective:  Vital signs in last 24 hours: Vitals:   12/19/17 1100 12/19/17 1130 12/19/17 1200 12/19/17 1230  BP: (!) 98/47 (!) 81/42 (!) 81/45 (!) 83/45  Pulse: 70 75 78 72  Resp:      Temp:      TempSrc:      SpO2:      Weight:      Height:       Physical Exam  Constitutional: She appears well-developed and well-nourished.  HENT:  Head: Normocephalic and atraumatic.  Cardiovascular: Normal rate, regular rhythm and intact distal pulses.  Murmur heard. Pulmonary/Chest: Effort normal and breath sounds normal. No respiratory distress.  Abdominal: Soft. Bowel sounds are normal. She exhibits no distension.  Tender to deep palpation of RLQ  Musculoskeletal: She exhibits no edema or deformity.  Patent fistula with palpable thrill LUE  Neurological: She is alert.  Skin: Skin is warm and dry.   Assessment/Plan: 77 yo with PMH of HTN, ESRD on HD MWF, prior CVA and recent admission for intractable nausea/vomiting who is presented for evaluation of nausea and emesis after dialysis.  Abdominal Pain: Nausea / Vomiting: Presented with ongoing recurrent nausea, vomiting that occurs with dialysis. Denies symptoms on non-HD days. She was recently admited for the same and did well with phenergan. She was prescribed phenergan on discharge and was able to pick this up, but did not try to take it until after she became nauseous and ended up throwing up the medication. > Troponin trended for atypical chest pain: Negative > Mesenteric doppler: showed patent celiac artery and SMA; IMA not visualized. >  PT/OT recommends HH PT/OT  - Phenergan 12.5mg  PO q4h PRN, and 12.5 PO prior to dialysis - Instructed to take medication prophylactically on discharge - Will benefit from [facility] referral  Anemia: Patient with worsening of chronic anemia on labs this AM. Hgb 8.8 from baseline of ~10. Patient is on erythropoietin stimulating agent in the setting of ESRD. Patient denies and symptoms and has had no bowl movement. - Continue to monitor  Hypertension: Patient wears clonidine 0.2 mg/day and changes every Wednesday, due for change 12/17/2017. Patient reported compliance with this regimen but states she does not take pills at home. Clonidine patch changed on admission (6/11) as patient was significantly hypertensive on admission. > Hypotensive this morning to 14G systolic > improved to 818H with 500cc IVF bolus - Discontinue clonidine patch - Discontinue Amlodipine  Abdominal tenderness: Patient unable to provide urine sample as she is ESRD. No symptoms beyond tenderness to palpation of suprapubic region. Labs are not suspicious for infection. Not appear tender during POCUS exam of bladder, which showed decompressed bladder (unable to obtain UA due to minimal urine production).  Somnolence: Resolved; labwork looked good, patient had just had long PT session.  FEN: Reneal diet with fluid restriction VTE Prophylaxis: Heparin TID Code Status: DNR  Dispo: Anticipated discharge in approximately 0-1 day(s).   Neva Seat, MD 12/19/2017, 12:51 PM Pager: [phone]

## 2017-12-19 NOTE — Progress Notes (Signed)
Subjective: Interval History: has no complaint, able to eat and drink this am..  Objective: Vital signs in last 24 hours: Temp:  [97.5 F (36.4 C)-98.4 F (36.9 C)] 98 F (36.7 C) (06/14 0739) Pulse Rate:  [68-92] 68 (06/14 0327) Resp:  [18] 18 (06/14 0327) BP: (79-100)/(38-53) 100/48 (06/14 0739) SpO2:  [98 %-100 %] 100 % (06/14 0327) Weight:  [59.6 kg (131 lb 6.3 oz)] 59.6 kg (131 lb 6.3 oz) (06/14 0500) Weight change: 2.6 kg (5 lb 11.7 oz)  Intake/Output from previous day: 06/13 0701 - 06/14 0700 In: 360 [P.O.:360] Out: -  Intake/Output this shift: No intake/output data recorded.  General appearance: alert, cooperative and no distress Resp: clear to auscultation bilaterally Cardio: S1, S2 normal and systolic murmur: systolic ejection 2/6, decrescendo at 2nd left intercostal space GI: soft, non-tender; bowel sounds normal; no masses,  no organomegaly Extremities: avf  LUA , otherwise unremarkable  Lab Results: Recent Labs    12/19/17 0757  WBC 5.2  HGB 8.8*  HCT 29.4*  PLT 176   BMET:  Recent Labs    12/17/17 0503 12/18/17 1115  NA 141 138  K 3.7 4.3  CL 94* 96*  CO2 28 29  GLUCOSE 107* 86  BUN 38* 19  CREATININE 8.90* 5.24*  CALCIUM 9.7 9.1   No results for input(s): PTH in the last 72 hours. Iron Studies: No results for input(s): IRON, TIBC, TRANSFERRIN, FERRITIN in the last 72 hours.  Studies/Results: No results found.  I have reviewed the patient's current medications.  Assessment/Plan: 1 ESRD HD, keep even 2 BP low stop meds 3 N,V  4 Anemia lower today,? Blood loss 5 HPTH on meds P HD, esa,     LOS: 1 day   Jeneen Rinks Azan Maneri 12/19/2017,9:09 AM

## 2017-12-19 NOTE — Care Management Note (Signed)
Case Management Note  Patient Details  Name: [place] B Debruyn MRN: [id number] Date of Birth: 09/20/1940  Subjective/Objective:                    Action/Plan: Plan is for patient to d/c home after bolus if stable. Pt was active with [facility] prior to admission. CM notified Butch Penny with [facility] of potential d/c and resumption orders. Pt has daughters in the room that can provide transport home.    Expected Discharge Date:  12/19/17               Expected Discharge Plan:  [place]  In-House Referral:     Discharge planning Services  CM Consult  Post Acute Care Choice:  Home Health, Resumption of Svcs/PTA Provider Choice offered to:  Patient  DME Arranged:    DME Agency:     HH Arranged:  RN, PT, OT [place] Agency:  [facility]  Status of Service:  In process, will continue to follow  If discussed at Long Length of Stay Meetings, dates discussed:    Additional Comments:  Pollie Friar, RN 12/19/2017, 4:21 PM

## 2017-12-19 NOTE — Progress Notes (Signed)
PT Cancellation Note  Patient Details Name: Nancy [name] MRN: [id number] DOB: 04-Feb-1941   Cancelled Treatment:    Reason Eval/Treat Not Completed: Patient at procedure or test/unavailable Patient is off unit for HD. PT will continue to follow acutely.    [place] April, PTA Pager: [phone]   12/19/2017, 8:40 AM

## 2017-12-19 NOTE — Procedures (Signed)
I was present at this session.  I have reviewed the session itself and made appropriate changes.  HD via LUA AVF.  Bp, give NS, stop patch.  Access press ok. tol her current bps  [employer] 6/14/20199:08 AM

## 2017-12-19 NOTE — Progress Notes (Signed)
Patient being discharged home. Discharged instructions were reviewed with the patient. Patient verbalized understanding.

## 2017-12-19 NOTE — Progress Notes (Signed)
Internal Medicine Attending:   I saw and examined the patient. I reviewed the resident's note and I agree with the resident's findings and plan as documented in the resident's note.  Patient feels well this morning with no new complaints.  Patient was initially admitted with intractable nausea and vomiting after hemodialysis.  Patient symptoms were likely secondary to her dialysis sessions.  She has had similar issues in the past since starting hemodialysis. Patient was started on Phenergan and was instructed to take this prior to hemodialysis.  Of note, patient blood pressures have been borderline but she is tolerating hemodialysis today.  We will discontinue her antihypertensive medications.  Patient's hemoglobin is also noted to be mildly lower than yesterday.  She has no active signs of bleeding.  Would consider CBC on follow-up as an outpatient.  No further work-up at this time.  Patient is stable for discharge home today if her blood pressures remained stable postdialysis.

## 2017-12-19 NOTE — Progress Notes (Signed)
Occupational Therapy Treatment Patient Details Name: Nancy [name] MRN: [id number] DOB: 1940-09-09 Today's Date: 12/19/2017    History of present illness 77 yo with PMH of HTN, ESRD on HD MWF, prior CVA and recent admission for intractable nausea/vomiting who is presented for evaluation of nausea and emesis after dialysis   OT comments  Pt agreeable to ADL and transfer training. With short distance OOB mobility in room pt with c/o dizziness and asking to return to sitting/supine. Pt sat EOB x3 minutes with no change in dizziness, returned to supine (BP 91/44, SpO2 mid 90s on RA--RN notified). D/c plan remains appropriate. Will continue to follow acutely.   Follow Up Recommendations  Home health OT;Supervision - Intermittent    Equipment Recommendations  None recommended by OT    Recommendations for Other Services      Precautions / Restrictions Precautions Precautions: Fall Precaution Comments: watch BP Restrictions Weight Bearing Restrictions: No       Mobility Bed Mobility Overal bed mobility: Modified Independent Bed Mobility: Supine to Sit;Sit to Supine     Supine to sit: Modified independent (Device/Increase time) Sit to supine: Modified independent (Device/Increase time)   General bed mobility comments: Increased time  Transfers Overall transfer level: Needs assistance Equipment used: Straight cane Transfers: Sit to/from Stand Sit to Stand: Min guard         General transfer comment: for safety    Balance Overall balance assessment: Needs assistance Sitting-balance support: Feet supported Sitting balance-Leahy Scale: Good     Standing balance support: Single extremity supported Standing balance-Leahy Scale: Fair Standing balance comment: able to stand without assist                           ADL either performed or assessed with clinical judgement   ADL Overall ADL's : Needs assistance/impaired                                      Functional mobility during ADLs: Minimal assistance;Cane General ADL Comments: Pt agreeable to participate in ADL and transfer training but with mobility in room pt reporting dizziness and requesting to lay back down. Pt sat EOB x3 minutes then returned to supine--at that point pts BP 91/44, SpO2 in mid 90s on RA.     Vision       Perception     Praxis      Cognition Arousal/Alertness: Awake/alert Behavior During Therapy: WFL for tasks assessed/performed Overall Cognitive Status: Within Functional Limits for tasks assessed                                          Exercises    Shoulder Instructions       General Comments     Pertinent Vitals/ Pain       Pain Assessment: Faces Faces Pain Scale: No hurt Pain Location: R knee Pain Descriptors / Indicators: Aching Pain Intervention(s): Limited activity within patient's tolerance;Monitored during session;Repositioned  Home Living                                          Prior Functioning/Environment  Frequency  Min 2X/week        Progress Toward Goals  OT Goals(current goals can now be found in the care plan section)  Progress towards OT goals: Not progressing toward goals - comment(medical complications)  Acute Rehab OT Goals Patient Stated Goal: to go home OT Goal Formulation: With patient  Plan Discharge plan remains appropriate    Co-evaluation                 AM-PAC PT "6 Clicks" Daily Activity     Outcome Measure   Help from another person eating meals?: None Help from another person taking care of personal grooming?: A Little Help from another person toileting, which includes using toliet, bedpan, or urinal?: A Little Help from another person bathing (including washing, rinsing, drying)?: A Little Help from another person to put on and taking off regular upper body clothing?: A Little Help from another person to put on and  taking off regular lower body clothing?: A Little 6 Click Score: 19    End of Session Equipment Utilized During Treatment: Gait belt  OT Visit Diagnosis: Unsteadiness on feet (R26.81);Dizziness and giddiness (R42);Muscle weakness (generalized) (M62.81)   Activity Tolerance Treatment limited secondary to medical complications (Comment)   Patient Left in bed;with call bell/phone within reach;with family/visitor present   Nurse Communication Other (comment)(low BP with c/o dizziness)        Time: 2500-3704 OT Time Calculation (min): 12 min  Charges: OT General Charges $OT Visit: 1 Visit OT Treatments $Therapeutic Activity: 8-22 mins  Teresa Nicodemus A. Ulice Brilliant, M.S., OTR/L Acute Rehab Department: [phone]  Binnie Kand 12/19/2017, 4:50 PM

## 2017-12-19 NOTE — Telephone Encounter (Signed)
Hospital f/u per Dr Trilby Drummer; pt appt 06/18 345pm/NW

## 2017-12-19 NOTE — Progress Notes (Signed)
OT Cancellation Note  Patient Details Name: Nancy [name] MRN: [id number] DOB: 1941-02-16   Cancelled Treatment:    Reason Eval/Treat Not Completed: Patient at procedure or test/ unavailable(currently in HD)  [place]. Ulice Brilliant, M.S., OTR/L Acute Rehab Department: [phone]  12/19/2017, 8:49 AM

## 2017-12-19 NOTE — Progress Notes (Signed)
Paged by RN that patient became symptomatic when walking with OT; experiencing dizziness. She had orthostatic completed prior to this which showed hypotension which had remain stable throughout the day (53Z systolic), without an orthostatic component. Patient was able to walk around the unit with PT without symptoms. Patient was seen at bedside and states she felt like she was going to pass out and had to lay back down, after which her symptoms resolved. > Patient with symptomatic hypotension, home antihypertensives have already been discontinued - Will bolus with 250cc of NS, then recheck vitals and walk patient - Discontinue discharge, If patient is asymptomatic after the above, will likely proceed with discharge  Pearson Grippe, DO IM PGY-1 Pager: [phone]

## 2017-12-19 NOTE — Discharge Summary (Signed)
Name: Nancy [name] MRN: [id number] DOB: Aug 15, 1940 77 y.o. PCP: Aldine Contes, MD  Date of Admission: 12/15/2017  7:38 PM Date of Discharge: 12/19/2017 Attending Physician: Aldine Contes, MD  Discharge Diagnosis:  1. Nausea and vomiting 2. ESRD 3. Anemia associated with chronic renal failure  Discharge Medications: Allergies as of 12/19/2017      Reactions   Hydrocodone-acetaminophen Nausea And Vomiting, Other (See Comments)   Dizziness (also)      Medication List    STOP taking these medications   amLODipine 10 MG tablet Commonly known as:  NORVASC   cloNIDine 0.2 mg/24hr patch Commonly known as:  CATAPRES - Dosed in mg/24 hr     TAKE these medications   acetaminophen 500 MG tablet Commonly known as:  TYLENOL Take 2 tablets (1,000 mg total) by mouth every 8 (eight) hours as needed for mild pain, moderate pain, fever or headache.   aspirin 81 MG EC tablet Take 1 tablet (81 mg total) by mouth daily.   calcitRIOL 0.25 MCG capsule Commonly known as:  ROCALTROL Take 1 capsule (0.25 mcg total) by mouth daily.   calcium acetate 667 MG capsule Commonly known as:  PHOSLO Take 2 capsules (1,334 mg total) by mouth 3 (three) times daily with meals.   lidocaine-prilocaine cream Commonly known as:  EMLA Apply 1 application topically 3 (three) times a week. APPLY SMALL AMOUNT TO ACCESS SITE (AVF) 3 TIMES A WEEK 1 HOUR BEFORE DIALYSIS. COVER WITH OCCLUSIVE DRESSING (SARAN WRAP)   nitroGLYCERIN 0.3 MG SL tablet Commonly known as:  NITROSTAT Place 1 tablet (0.3 mg total) under the tongue every 5 (five) minutes as needed for chest pain.   polyethylene glycol packet Commonly known as:  MIRALAX / GLYCOLAX Take 17 g by mouth daily.   promethazine 12.5 MG tablet Commonly known as:  PHENERGAN Take 1 tablet (12.5 mg total) by mouth every 6 (six) hours as needed for nausea or vomiting (Take 1 prior to dialysis). What changed:  reasons to take this   senna-docusate  8.6-50 MG tablet Commonly known as:  Senokot-S Take 1 tablet by mouth at bedtime as needed for mild constipation or moderate constipation.   SENSIPAR 60 MG tablet Generic drug:  cinacalcet Take 1 tablet (60 mg total) by mouth daily.       Disposition and follow-up:   Nancy [name] was discharged from [facility] in [place] condition.  At the hospital follow up visit please address:  Nausea/Abdominal pain:  - Evaluate for improvement in symptoms especially with HD - Ensure taking medication prophylactically prior to HD - Evaluate for continued abdominal pain  Anemia: - Recheck CBC to evaluate for stability/Improvement  Hypertension/Hypotension: - Amlodipine and clonidine were held at discharge. - Recheck blood pressure and evaluate need for resumption of antihypertensive - Evaluate for recurrent hypotension with or without symptoms  2.  Labs / imaging needed at time of follow-up: CBC  3.  Pending labs/ test needing follow-up: None  Follow-up Appointments:   Hospital Course by problem list:  Abdominal Pain: Nausea / Vomiting: Patient presented with ongoing recurrent nausea, vomiting that occurs with dialysis. Denies symptoms on non-HD days. She was recently admited with similar presentation and did well with phenergan. She was prescribed phenergan on discharge and was able to pick this up, but did not try to take it until after she became nauseous and ended up throwing up the medication. Patient's nausea improved with IV phenergan and she was transitioned to PO  phenergan. She was eating comfortably prior to discharge. Her troponin was trended for atypical chest pain and was negative. A mesenteric doppler was obtained to evauate for possible non occlusive mesenteric ischemia exacerbated by HD; which showed patent celiac artery and SMA; IMA not visualized. Patient was seen by PT/OT who recommended [facility] PT[facility]OT. Patient discharge with PRN phenergan and instruction to  take this prior to HD to prevent nausea episodes. She was also discharged with home health PT, OT and RN. She was provided a referral to [facility].  Anemia: Patient with worsening of chronic anemia while admitted with Hgb 8.8 on day of admission  from baseline of ~10. She is on erythrcyte stimulating agent in the setting of ESRD. Patient denied any symptoms or change in stools.  Hypertension/Hypotension:Patient wore clonidine 0.2 mg/day, changes every Wednesday; this was changed on admission as patient was hypertensive. Her amlodipine was also restarted as she reportedly did not take her pills at home. With medication and HD (and decreased oral intake while nauseous) her blood pressure improved and even becam hypotensive the night prior to and the day of discharge. She received a 583mL bolus overnight and remained asymptomatic. Her blood pressure medications were discontinued. She only had 164mL pulled of in HD that day and BP remained stable in the 99M systolic and were not orthostatic. She did experience an episode of lightheaded when walking with OT prior to discharge, for which she received an additional 151mL bolus. Following this she remained asymptomatic, and blood pressure improved, her blood pressure is expected to continue to rise with increasing oral intake. Amlodipine and clonidine were held at discharge.  Abdominal tenderness: Patient presented with tenderness to deep palpation of RLQ/Suprapubic region. She was unable to provide urine sample as she is ESRD and makes very little urine. No symptoms beyond tenderness to palpation of suprapubic region on presentation to suggest UTI. Labswere not suspicious for infection. Abdominal film was without obstruction. She was not tender during POCUS exam of bladder, which showed decompressed bladder (unable to obtain UA due to minimal urine production).  Discharge Vitals:   BP (!) 103/43   Pulse 86   Temp 98.1 F (36.7 C) (Oral)   Resp 16   Ht 5\' 3"  (1.6  m)   Wt 126 lb 12.2 oz (57.5 kg)   SpO2 100%   BMI 22.46 kg/m   Pertinent Labs, Studies, and Procedures:  CBC Latest Ref Rng & Units 12/19/2017 12/15/2017 11/28/2017  WBC 4.0 - 10.5 K/uL 5.2 5.9 6.4  Hemoglobin 12.0 - 15.0 g/dL 8.8(L) 10.0(L) 9.4(L)  Hematocrit 36.0 - 46.0 % 29.4(L) 32.2(L) 29.9(L)  Platelets 150 - 400 K/uL 176 190 162   CMP Latest Ref Rng & Units 12/19/2017 12/18/2017 12/17/2017  Glucose 65 - 99 mg/dL 73 86 107(H)  BUN 6 - 20 mg/dL 31(H) 19 38(H)  Creatinine 0.44 - 1.00 mg/dL 6.84(H) 5.24(H) 8.90(H)  Sodium 135 - 145 mmol/L 137 138 141  Potassium 3.5 - 5.1 mmol/L 4.4 4.3 3.7  Chloride 101 - 111 mmol/L 98(L) 96(L) 94(L)  CO2 22 - 32 mmol/L 29 29 28   Calcium 8.9 - 10.3 mg/dL 8.5(L) 9.1 9.7  Total Protein 6.5 - 8.1 g/dL - 5.7(L) -  Total Bilirubin 0.3 - 1.2 mg/dL - 0.6 -  Alkaline Phos 38 - 126 U/L - 47 -  AST 15 - 41 U/L - 13(L) -  ALT 14 - 54 U/L - 7(L) -   CXR (12/15/17): IMPRESSION: No active disease.  Aortic atherosclerosis.  Abd Xray (12/16/17): IMPRESSION: Normal bowel gas pattern. Small to moderate stool burden without bowel obstruction or fecal impaction.  Discharge Instructions: Discharge Instructions    AMB Referral to [place] Management   Complete by:  As directed    Reason for consult:  Coordination of Care, Medication managmenet   Diagnoses of:  Kidney Failure   Expected date of contact:  1-3 days (reserved for hospital discharges)   Call MD for:  difficulty breathing, headache or visual disturbances   Complete by:  As directed    Call MD for:  persistant dizziness or light-headedness   Complete by:  As directed    Call MD for:  persistant nausea and vomiting   Complete by:  As directed    Call MD for:  temperature >100.4   Complete by:  As directed    Diet - low sodium heart healthy   Complete by:  As directed    Discharge instructions   Complete by:  As directed    Thank you allowing [place] to care for you  Your nausea has improved with  medications: - we did not see any abnormality on the ultrasound of the blood vessels in your abdomen - please take phenergan (for nausea) before dialysis and as needed  For your high blood pressure: - Your blood pressure was low on the day you left the hospital - Please stop taking your home medication until you can be further evaluated  Please follow up in the clinic on Tuesday 12/23/17 at 3:45pm   Please contact the clinic if you experience return of severe symptoms   Increase activity slowly   Complete by:  As directed       Signed: Neva Seat, MD 12/23/2017, 7:09 AM   Pager: [phone]

## 2017-12-19 NOTE — Progress Notes (Signed)
Physical Therapy Treatment Patient Details Name: Nancy [name] MRN: [id number] DOB: 01-02-1941 Today's Date: 12/19/2017    History of Present Illness 77 yo with PMH of HTN, ESRD on HD MWF, prior CVA and recent admission for intractable nausea/vomiting who is presented for evaluation of nausea and emesis after dialysis    PT Comments    Patient overall supervision/min guard assist for transfers and gait. Current plan remains appropriate.    Follow Up Recommendations  Home health PT;Supervision for mobility/OOB     Equipment Recommendations  None recommended by PT    Recommendations for Other Services       Precautions / Restrictions Precautions Precautions: Fall Restrictions Weight Bearing Restrictions: No    Mobility  Bed Mobility Overal bed mobility: Modified Independent Bed Mobility: Supine to Sit           General bed mobility comments: increased time and effort  Transfers Overall transfer level: Needs assistance Equipment used: None Transfers: Sit to/from Stand Sit to Stand: Min guard         General transfer comment: min guard for safety  Ambulation/Gait Ambulation/Gait assistance: Min guard Gait Distance (Feet): 200 Feet Assistive device: Straight cane Gait Pattern/deviations: Step-through pattern;Decreased stride length(lateral sway) Gait velocity: decreased   General Gait Details: pt with slightly increased cadence compared to previous session; pt with unsteady gait with horizontal and vertical head turns but no overt LOB   Stairs Stairs: Yes Stairs assistance: Min guard Stair Management: One rail Left;Step to pattern;Forwards;With cane Number of Stairs: 2 General stair comments: cues for sequencing   Wheelchair Mobility    Modified Rankin (Stroke Patients Only) Modified Rankin (Stroke Patients Only) Pre-Morbid Rankin Score: No significant disability Modified Rankin: No significant disability     Balance Overall balance  assessment: Needs assistance Sitting-balance support: Feet supported Sitting balance-Leahy Scale: Good       Standing balance-Leahy Scale: Fair Standing balance comment: able to stand without assist                            Cognition Arousal/Alertness: Awake/alert Behavior During Therapy: WFL for tasks assessed/performed Overall Cognitive Status: Within Functional Limits for tasks assessed                                        Exercises General Exercises - Lower Extremity Long Arc Quad: AROM;Both;Seated Hip Flexion/Marching: AROM;Both;Seated Toe Raises: AROM;Both Heel Raises: AROM;Both    General Comments General comments (skin integrity, edema, etc.): no c/o nausea or dizziness during session      Pertinent Vitals/Pain Pain Assessment: Faces Faces Pain Scale: Hurts little more Pain Location: R knee Pain Descriptors / Indicators: Aching Pain Intervention(s): Limited activity within patient's tolerance;Monitored during session;Repositioned    Home Living                      Prior Function            PT Goals (current goals can now be found in the care plan section) Acute Rehab PT Goals Patient Stated Goal: to go home PT Goal Formulation: With patient/family Time For Goal Achievement: 12/30/17 Potential to Achieve Goals: Good Progress towards PT goals: Progressing toward goals    Frequency    Min 3X/week      PT Plan Current plan remains appropriate    Co-evaluation  AM-PAC PT "6 Clicks" Daily Activity  Outcome Measure  Difficulty turning over in bed (including adjusting bedclothes, sheets and blankets)?: A Little Difficulty moving from lying on back to sitting on the side of the bed? : A Little Difficulty sitting down on and standing up from a chair with arms (e.g., wheelchair, bedside commode, etc,.)?: A Little Help needed moving to and from a bed to chair (including a wheelchair)?: A  Little Help needed walking in hospital room?: A Little Help needed climbing 3-5 steps with a railing? : A Little 6 Click Score: 18    End of Session Equipment Utilized During Treatment: Gait belt Activity Tolerance: Patient tolerated treatment well Patient left: with call bell/phone within reach;in bed;with family/visitor present(pt sitting EOB ) Nurse Communication: Mobility status PT Visit Diagnosis: Unsteadiness on feet (R26.81);Dizziness and giddiness (R42)     Time: 0981-1914 PT Time Calculation (min) (ACUTE ONLY): 13 min  Charges:  $Gait Training: 8-22 mins                    G Codes:       Earney Navy, PTA Pager: [phone]     Darliss Cheney 12/19/2017, 4:27 PM

## 2017-12-22 ENCOUNTER — Other Ambulatory Visit: Payer: Self-pay | Admitting: *Deleted

## 2017-12-22 DIAGNOSIS — N186 End stage renal disease: Secondary | ICD-10-CM | POA: Diagnosis not present

## 2017-12-22 DIAGNOSIS — D689 Coagulation defect, unspecified: Secondary | ICD-10-CM | POA: Diagnosis not present

## 2017-12-22 DIAGNOSIS — D631 Anemia in chronic kidney disease: Secondary | ICD-10-CM | POA: Diagnosis not present

## 2017-12-22 DIAGNOSIS — R51 Headache: Secondary | ICD-10-CM | POA: Diagnosis not present

## 2017-12-22 DIAGNOSIS — N2581 Secondary hyperparathyroidism of renal origin: Secondary | ICD-10-CM | POA: Diagnosis not present

## 2017-12-22 NOTE — Patient Outreach (Signed)
[facility]) Care Management  12/22/2017  [place] Nancy [name] 05-May-1941 [id number]   Transition of Care Referral   Referral Date: 12/22/17 Referral Source:  [facility] MD referral at discharge from  Hospital  Date of Admission: 12/16/17 Diagnosis:  Kidney failure Date of Discharge: 12/19/17 Facility: [facility] Insurance: [place] health care medicare  Outreach attempt #1 Outreach attempt to patient. No answer and unable to leave voicemail message at the listed mobile (unable to receive calls at this time) and home numbers (no voice mail set up) listed for Nancy [name]. No Environmental consultant (DPR) found in [place]. Emergency contacts only lists a daughter and grand daughter   Conditions: ESRD with HD, kidney failure, HTN, Hypotension episodes, GERD, diverticulosis of colon, non intractable nausea and vomiting Osteoarthrosis of right lower leg, hyperlipidemia, anemia, hx of cva, constipation, glaucoma,  Aortic stenosis, mitral valve annular calcification, pulmonary nodules   Plan:[facility] RN CM sent an unsuccessful outreach letter and scheduled this patient for another call attempt within 1 business day  Lorma Heater L. Lavina Hamman, RN, BSN, CCM [facility] Telephonic Care Management Care Coordinator Direct number [phone]  Main [facility] number [phone] Fax number [phone]

## 2017-12-23 ENCOUNTER — Other Ambulatory Visit: Payer: Self-pay | Admitting: *Deleted

## 2017-12-23 ENCOUNTER — Encounter: Payer: Self-pay | Admitting: Internal Medicine

## 2017-12-23 ENCOUNTER — Telehealth: Payer: Self-pay | Admitting: Internal Medicine

## 2017-12-23 ENCOUNTER — Ambulatory Visit (INDEPENDENT_AMBULATORY_CARE_PROVIDER_SITE_OTHER): Payer: Medicare Other | Admitting: Internal Medicine

## 2017-12-23 VITALS — BP 144/59 | HR 93 | Temp 98.4°F | Wt 133.5 lb

## 2017-12-23 DIAGNOSIS — Z992 Dependence on renal dialysis: Secondary | ICD-10-CM

## 2017-12-23 DIAGNOSIS — Z8673 Personal history of transient ischemic attack (TIA), and cerebral infarction without residual deficits: Secondary | ICD-10-CM | POA: Diagnosis not present

## 2017-12-23 DIAGNOSIS — N186 End stage renal disease: Secondary | ICD-10-CM | POA: Diagnosis not present

## 2017-12-23 DIAGNOSIS — R011 Cardiac murmur, unspecified: Secondary | ICD-10-CM | POA: Diagnosis not present

## 2017-12-23 DIAGNOSIS — M546 Pain in thoracic spine: Secondary | ICD-10-CM | POA: Insufficient documentation

## 2017-12-23 DIAGNOSIS — I10 Essential (primary) hypertension: Secondary | ICD-10-CM

## 2017-12-23 DIAGNOSIS — I120 Hypertensive chronic kidney disease with stage 5 chronic kidney disease or end stage renal disease: Secondary | ICD-10-CM | POA: Diagnosis not present

## 2017-12-23 DIAGNOSIS — R112 Nausea with vomiting, unspecified: Secondary | ICD-10-CM | POA: Diagnosis not present

## 2017-12-23 DIAGNOSIS — Z79899 Other long term (current) drug therapy: Secondary | ICD-10-CM | POA: Diagnosis not present

## 2017-12-23 DIAGNOSIS — G8929 Other chronic pain: Secondary | ICD-10-CM | POA: Diagnosis not present

## 2017-12-23 MED ORDER — CLONIDINE 0.1 MG/24HR TD PTWK: 0.1000 mg | MEDICATED_PATCH | TRANSDERMAL | 0 refills | Status: DC

## 2017-12-23 MED ORDER — AMLODIPINE BESYLATE 5 MG PO TABS: 5.0000 mg | ORAL_TABLET | Freq: Every day | ORAL | 2 refills | Status: DC

## 2017-12-23 NOTE — Progress Notes (Signed)
CC: Back pain  HPI:  [name] is a 77 y.o. female with past medical history as listed below including ESRD on HD, HTN, CVA who presents for hospital follow-up after admission for recurrent nausea and vomiting thought related to dialysis.  Please see problem based charting for status of patient's chronic medical issues.  Non-intractable vomiting with nausea Patient was recently admitted from 6/10-6/14/2019 for recurrent nausea and vomiting.  These were noted to occur during her dialysis days. During admission mesenteric Doppler was obtained which showed a patent celiac artery and SMA, IMA not visualized.  Her nausea was treated with IV Phenergan with relief and she was discharged with as needed Phenergan to take before dialysis sessions.  Patient states that she has been taking the Phenergan before dialysis days and has been doing "great."  She denies any nausea, vomiting, decreased appetite, dizziness, abdominal pain, diarrhea, or constipation. A/P: Patient with nausea and vomiting related to dialysis which is relieved with as needed Phenergan prior to dialysis.  Recommend that she continue Phenergan as needed prior to dialysis.  Essential hypertension During recent admission patient was initially hypertensive on her home clonidine 0.2 mg patch and amlodipine 10 mg were restarted.  With the addition of her medications and dialysis her blood pressure dropped and she required IV fluid resuscitation to improve her blood pressures.  On discharge she was instructed to discontinue her amlodipine and her clonidine patch.  Patient states that she still has chronic patches at home and is continue to wear it.  She says she did have a drop in her blood pressure with dialysis on 12/22/2017 which required staff to place her in the recumbent position. Her BP on arrival this visit was 180/59.  Repeat BP was 144/59.  BP Readings from Last 3 Encounters:  12/23/17 (!) 144/59  12/19/17 (!) 103/43    11/28/17 (!) 136/54   A/P: Patient with hypertension on nondialysis days and drop in blood pressures with dialysis.  Her hypotension with dialysis was likely exacerbated by clonidine patch.  I have instructed her to taper off the 0.2 mg weekly patch.  I have sent in a single 0.1 mg clonidine patch for her to wear for 3 days after which she will remove it and then start amlodipine 5 mg once daily for blood pressure.  Midline thoracic back pain Patient reports about 6 months history of pain in the middle of her back over a spinous process and her thoracic region.  She denies any history of trauma or heavy lifting.  She says the pain is more noticeable when lying down and improves when in the bathtub with hot water.  She uses extra strength Tylenol with some relief.  She denies any prior history of fractures.  She denies any new focal weakness.  She denies any bowel incontinence.  She no longer makes urine. A/P: Patient with midline back pain with some tenderness over the spinous process in the thoracic region.  She is not displaying any alarm symptoms.  I recommend she continue Tylenol as needed. If pain worsens we can consider imaging to assess for vertebral fracture in this patient with chronic renal disease, however therapy for osteoporosis and end-stage renal disease may be limited.    Past Medical History:  Diagnosis Date  . Anemia   . Aortic stenosis   . Bacterial sinusitis 09/17/2011  . CHF (congestive heart failure) ([place])   . CKD (chronic kidney disease) stage 4, GFR 15-29 ml/min (HCC) 08/11/2006  Cr continues to increase. Proteinuria on UA 02/10/12.    . Colitis   . CVA (cerebrovascular accident) [facility])    New hemorrhagic per CT scan '09  . Diverticulosis of colon   . Dysfunctional uterine bleeding   . ESRD (end stage renal disease) on dialysis ([place])    "MWF; [address] [address]" (11/27/2017)  . Fecal impaction ([place])   . Headache(784.0)   . Heart murmur   . HERNIORRHAPHY, HX OF 08/11/2006  .  Hypertension   . OA (osteoarthritis)    bilateral knees  . Postmenopausal   . Pulmonary nodule   . TINEA CRURIS 01/12/2007   Review of Systems:   Review of Systems  Respiratory: Negative for shortness of breath.   Cardiovascular: Negative for chest pain and leg swelling.  Gastrointestinal: Negative for abdominal pain, constipation, diarrhea, nausea and vomiting.  Genitourinary:       Not making urine  Musculoskeletal: Positive for back pain. Negative for falls.  Neurological: Negative for dizziness and loss of consciousness.     Physical Exam:  Vitals:   12/23/17 1535 12/23/17 1600  BP: (!) 180/59 (!) 144/59  Pulse: 95 93  Temp: 98.4 F (36.9 C)   TempSrc: Oral   SpO2: 100%   Weight: 133 lb 8 oz (60.6 kg)    Physical Exam  Constitutional: She is oriented to person, place, and time. She appears well-developed and well-nourished. No distress.  HENT:  Head: Normocephalic and atraumatic.  Cardiovascular: Normal rate and regular rhythm.  Murmur heard. AVF LUE with palpable thrill  Pulmonary/Chest: Effort normal. No respiratory distress. She has no wheezes. She has no rales.  Neurological: She is alert and oriented to person, place, and time.  Skin: Skin is warm. She is not diaphoretic.     Assessment & Plan:   See Encounters Tab for problem based charting.  Patient discussed with Dr. Lynnae January

## 2017-12-23 NOTE — Telephone Encounter (Signed)
[facility] HAS TRIED TO REACH HER NO LUCK, WILL TRY AGAIN, ALSO SENT LETTER. Joelene Millin [phone]

## 2017-12-23 NOTE — Patient Outreach (Signed)
[facility]) Care Management  12/23/2017  [place] B Rudnicki 05-19-1941 [id number]   Transition of Care Referral  Referral Date: 12/22/17 Referral Source:  [facility] MD referral at discharge from  [facility]  Reason for consult: coordination of care, medication management with dx of kidney failure Date of Admission: 12/16/17 Diagnosis:  Kidney failure Date of Discharge: 12/19/17 Facility: [facility] Insurance: [employer] health care [employer] attempt #2 Call to mobile number without an answer, wireless customer is not available Call to her home number without an answer states her voice mail box has not been set up  Conditions: ESRD with HD, kidney failure, HTN, Hypotension episodes, GERD, diverticulosis of colon, non intractable nausea and vomiting Osteoarthrosis of right lower leg, hyperlipidemia, anemia, hx of cva, constipation, glaucoma,  Aortic stenosis, mitral valve annular calcification, pulmonary nodules   Plan: [facility] RN CM will schedule this patient for another call attempt with in 1-2 business day [facility] RN CM sent an unsuccessful outreach letter on 12/22/17   This Patient's MD is a [facility] participating MD and Transition of care is completed by the primary car provider office. Reason for consult: coordination of care, medication management with dx of kidney failure [facility] CM spoke with Hilda Blades at the [facility] health internal medicine office.  Patient had an appointment today at 3:45 pm and was seen.Hilda Blades confirmed the home and mobile numbers are correct in Epic.  Debra left a message at the MD office for staff to have the patient contact this [facility] RN CM. Left [facility] RN CM contact information   Chay Mazzoni L. Lavina Hamman, RN, BSN, CCM [facility] Telephonic Care Management Care Coordinator Direct number [phone]  Main [facility] number [phone] Fax number [phone]

## 2017-12-23 NOTE — Patient Instructions (Addendum)
It was a pleasure to see you [name].  I am glad you are feeling better.  Please decrease your Clonidine patch to 0.1 mg. I have sent a prescription to your pharmacy. Wear this for 3 days then remove.  Start Amlodipine 5 mg after you stop the Clonidine patch.  You may continue Tylenol for your back pain as needed. Please call if your pain worsens.  Please continue your other medications as prescribed and follow up with Dr. Dareen Piano in about 4-6 weeks or see [place] sooner if needed.

## 2017-12-24 ENCOUNTER — Other Ambulatory Visit: Payer: Self-pay | Admitting: *Deleted

## 2017-12-24 DIAGNOSIS — N186 End stage renal disease: Secondary | ICD-10-CM | POA: Diagnosis not present

## 2017-12-24 DIAGNOSIS — N2581 Secondary hyperparathyroidism of renal origin: Secondary | ICD-10-CM | POA: Diagnosis not present

## 2017-12-24 DIAGNOSIS — D631 Anemia in chronic kidney disease: Secondary | ICD-10-CM | POA: Diagnosis not present

## 2017-12-24 DIAGNOSIS — R51 Headache: Secondary | ICD-10-CM | POA: Diagnosis not present

## 2017-12-24 DIAGNOSIS — D689 Coagulation defect, unspecified: Secondary | ICD-10-CM | POA: Diagnosis not present

## 2017-12-24 NOTE — Progress Notes (Signed)
Internal Medicine Clinic Attending  Case discussed with Dr. Patel at the time of the visit.  We reviewed the resident's history and exam and pertinent patient test results.  I agree with the assessment, diagnosis, and plan of care documented in the resident's note.  

## 2017-12-24 NOTE — Patient Outreach (Signed)
[facility]) Care Management  12/24/2017  Nancy [name] [id number]   MD referral plus Transition of Care assessment Referral Date:12/22/17 Referral Source:[facility] MD referral at discharge from [place] for consult: coordination of care, medication management with dx of kidney failure Date of Admission:12/16/17 Diagnosis:Kidney failure Date of Discharge:12/19/17 home with [facility], PT/OT Facility:[facility] Insurance:[employer] medicare  Outreach attempt #3, successful Patient is able to verify HIPAA Reviewed and addressed Transitional of care referral with patient She has been admitted x 5 in the last 6 months  Nancy [name] states she is doing fair today.  She confirms she goes to HD now on Mondays, Wednesdays and Fridays She confirms concerns with coordination of care at home and mediation management She was noted with a cough, reports she has had it since her discharge and return to HD and was told by a HD provider to take "tylenol"    Social: She reports living at home with a family member but is independent with ADLs and needs assist with iADLs.   Conditions: Kidney failure, ESRD, chronic anemia, HTN, and hypotension episodes, aortic stenosis, mitral valve annular calcification  Medications: She reports she is on more than 10 medicines, having trouble affording all of her medications and is unable to specify which medication is more expensive.  With review if her medications she is unsure of which medications she is taking and not taking.  Appointments: She was seen by her primary MD on 12/23/17 for her hospital follow up  Advance directives: She reports having advanced directives and not being interested in changing/updating them  Consent: [facility] RN CM reviewed [facility] services with patient. Patient gave verbal consent for services. Advised patient that other post discharge calls may occur to assess how the patient is doing following the recent  hospitalization. Patient voiced understanding and was appreciative of f/u call.   Plan: [facility] RN CM will refer Nancy [name] to [facility] community RN CM and Learned (Polypharmacy med review-patient taking greater than 10 meds Medication assistance. Patient unable to afford medicines She is not able during RN CM assessment to specify which medications she can not afford but states "all of them" She is not able to tell CM names of her medications) for further assistance   [name] L. Lavina Hamman, RN, BSN, CCM [facility] Telephonic Care Management Care Coordinator Direct number [phone]  Main [facility] number [phone] Fax number [phone]

## 2017-12-24 NOTE — Assessment & Plan Note (Signed)
Patient reports about 6 months history of pain in the middle of her back over a spinous process and her thoracic region.  She denies any history of trauma or heavy lifting.  She says the pain is more noticeable when lying down and improves when in the bathtub with hot water.  She uses extra strength Tylenol with some relief.  She denies any prior history of fractures.  She denies any new focal weakness.  She denies any bowel incontinence.  She no longer makes urine. A/P: Patient with midline back pain with some tenderness over the spinous process in the thoracic region.  She is not displaying any alarm symptoms.  I recommend she continue Tylenol as needed. If pain worsens we can consider imaging to assess for vertebral fracture in this patient with chronic renal disease, however therapy for osteoporosis and end-stage renal disease may be limited.

## 2017-12-24 NOTE — Telephone Encounter (Signed)
Patient completed HFU 12/23/2017. Hubbard Hartshorn, RN, BSN  '

## 2017-12-24 NOTE — Assessment & Plan Note (Signed)
Patient was recently admitted from 6/10-6/14/2019 for recurrent nausea and vomiting.  These were noted to occur during her dialysis days. During admission mesenteric Doppler was obtained which showed a patent celiac artery and SMA, IMA not visualized.  Her nausea was treated with IV Phenergan with relief and she was discharged with as needed Phenergan to take before dialysis sessions.  Patient states that she has been taking the Phenergan before dialysis days and has been doing "great."  She denies any nausea, vomiting, decreased appetite, dizziness, abdominal pain, diarrhea, or constipation. A/P: Patient with nausea and vomiting related to dialysis which is relieved with as needed Phenergan prior to dialysis.  Recommend that she continue Phenergan as needed prior to dialysis.

## 2017-12-24 NOTE — Assessment & Plan Note (Signed)
During recent admission patient was initially hypertensive on her home clonidine 0.2 mg patch and amlodipine 10 mg were restarted.  With the addition of her medications and dialysis her blood pressure dropped and she required IV fluid resuscitation to improve her blood pressures.  On discharge she was instructed to discontinue her amlodipine and her clonidine patch.  Patient states that she still has chronic patches at home and is continue to wear it.  She says she did have a drop in her blood pressure with dialysis on 12/22/2017 which required staff to place her in the recumbent position. Her BP on arrival this visit was 180/59.  Repeat BP was 144/59.  BP Readings from Last 3 Encounters:  12/23/17 (!) 144/59  12/19/17 (!) 103/43  11/28/17 (!) 136/54   A/P: Patient with hypertension on nondialysis days and drop in blood pressures with dialysis.  Her hypotension with dialysis was likely exacerbated by clonidine patch.  I have instructed her to taper off the 0.2 mg weekly patch.  I have sent in a single 0.1 mg clonidine patch for her to wear for 3 days after which she will remove it and then start amlodipine 5 mg once daily for blood pressure.

## 2017-12-25 ENCOUNTER — Ambulatory Visit (INDEPENDENT_AMBULATORY_CARE_PROVIDER_SITE_OTHER): Payer: Medicare Other | Admitting: Internal Medicine

## 2017-12-25 ENCOUNTER — Other Ambulatory Visit: Payer: Self-pay

## 2017-12-25 ENCOUNTER — Observation Stay (HOSPITAL_COMMUNITY)
Admission: AD | Admit: 2017-12-25 | Discharge: 2017-12-30 | Disposition: A | Discharge status: Home / Self Care | Payer: Medicare Other | Source: Ambulatory Visit | Attending: Internal Medicine | Admitting: Internal Medicine

## 2017-12-25 ENCOUNTER — Encounter (HOSPITAL_COMMUNITY): Payer: Self-pay | Admitting: General Practice

## 2017-12-25 ENCOUNTER — Telehealth: Payer: Self-pay | Admitting: *Deleted

## 2017-12-25 VITALS — BP 194/74 | HR 111 | Temp 97.8°F | Ht 63.0 in | Wt 129.2 lb

## 2017-12-25 DIAGNOSIS — Z992 Dependence on renal dialysis: Secondary | ICD-10-CM

## 2017-12-25 DIAGNOSIS — N186 End stage renal disease: Secondary | ICD-10-CM | POA: Diagnosis present

## 2017-12-25 DIAGNOSIS — R109 Unspecified abdominal pain: Secondary | ICD-10-CM | POA: Diagnosis not present

## 2017-12-25 DIAGNOSIS — R112 Nausea with vomiting, unspecified: Secondary | ICD-10-CM

## 2017-12-25 DIAGNOSIS — I120 Hypertensive chronic kidney disease with stage 5 chronic kidney disease or end stage renal disease: Secondary | ICD-10-CM | POA: Diagnosis not present

## 2017-12-25 DIAGNOSIS — R34 Anuria and oliguria: Secondary | ICD-10-CM | POA: Diagnosis not present

## 2017-12-25 DIAGNOSIS — Z79899 Other long term (current) drug therapy: Secondary | ICD-10-CM | POA: Insufficient documentation

## 2017-12-25 DIAGNOSIS — R011 Cardiac murmur, unspecified: Secondary | ICD-10-CM

## 2017-12-25 DIAGNOSIS — Z8673 Personal history of transient ischemic attack (TIA), and cerebral infarction without residual deficits: Secondary | ICD-10-CM | POA: Diagnosis not present

## 2017-12-25 DIAGNOSIS — Z885 Allergy status to narcotic agent status: Secondary | ICD-10-CM | POA: Diagnosis not present

## 2017-12-25 DIAGNOSIS — E1122 Type 2 diabetes mellitus with diabetic chronic kidney disease: Secondary | ICD-10-CM | POA: Diagnosis not present

## 2017-12-25 DIAGNOSIS — Z66 Do not resuscitate: Secondary | ICD-10-CM | POA: Insufficient documentation

## 2017-12-25 LAB — COMPREHENSIVE METABOLIC PANEL
ALK PHOS: 54 U/L (ref 38–126)
ALT: 13 U/L — AB (ref 14–54)
AST: 26 U/L (ref 15–41)
Albumin: 3.6 g/dL (ref 3.5–5.0)
Anion gap: 16 — ABNORMAL HIGH (ref 5–15)
BUN: 19 mg/dL (ref 6–20)
CALCIUM: 10.5 mg/dL — AB (ref 8.9–10.3)
CO2: 31 mmol/L (ref 22–32)
CREATININE: 6.53 mg/dL — AB (ref 0.44–1.00)
Chloride: 95 mmol/L — ABNORMAL LOW (ref 101–111)
GFR calc non Af Amer: 6 mL/min — ABNORMAL LOW (ref >60)
GFR, EST AFRICAN AMERICAN: 6 mL/min — AB (ref >60)
Glucose, Bld: 126 mg/dL — ABNORMAL HIGH (ref 65–99)
Potassium: 3.7 mmol/L (ref 3.5–5.1)
Sodium: 142 mmol/L (ref 135–145)
Total Bilirubin: 0.8 mg/dL (ref 0.3–1.2)
Total Protein: 7.1 g/dL (ref 6.5–8.1)

## 2017-12-25 LAB — CBC
HCT: 38.5 % (ref 36.0–46.0)
Hemoglobin: 11.7 g/dL — ABNORMAL LOW (ref 12.0–15.0)
MCH: 31.2 pg (ref 26.0–34.0)
MCHC: 30.4 g/dL (ref 30.0–36.0)
MCV: 102.7 fL — AB (ref 78.0–100.0)
PLATELETS: 196 10*3/uL (ref 150–400)
RBC: 3.75 MIL/uL — AB (ref 3.87–5.11)
RDW: 18.3 % — AB (ref 11.5–15.5)
WBC: 6.3 10*3/uL (ref 4.0–10.5)

## 2017-12-25 MED ORDER — PROMETHAZINE HCL 25 MG PO TABS
12.5000 mg | ORAL_TABLET | Freq: Four times a day (QID) | ORAL | Status: DC | PRN
  Administered 2017-12-27 – 2017-12-29 (×4): 12.5 mg via ORAL
  Filled 2017-12-25 (×5): qty 1

## 2017-12-25 MED ORDER — ACETAMINOPHEN 650 MG RE SUPP: 650.0000 mg | Freq: Four times a day (QID) | RECTAL | Status: DC | PRN

## 2017-12-25 MED ORDER — AMLODIPINE BESYLATE 5 MG PO TABS
5.0000 mg | ORAL_TABLET | Freq: Every day | ORAL | Status: DC
  Administered 2017-12-25 – 2017-12-27 (×3): 5 mg via ORAL
  Filled 2017-12-25 (×3): qty 1

## 2017-12-25 MED ORDER — PROMETHAZINE HCL 25 MG PO TABS: 12.5000 mg | ORAL_TABLET | Freq: Every day | ORAL | Status: DC | PRN

## 2017-12-25 MED ORDER — SENNOSIDES-DOCUSATE SODIUM 8.6-50 MG PO TABS
1.0000 | ORAL_TABLET | Freq: Every evening | ORAL | Status: DC | PRN
  Administered 2017-12-29: 1 via ORAL
  Filled 2017-12-25: qty 1

## 2017-12-25 MED ORDER — HEPARIN SODIUM (PORCINE) 5000 UNIT/ML IJ SOLN
5000.0000 [IU] | Freq: Three times a day (TID) | INTRAMUSCULAR | Status: DC
  Administered 2017-12-25 – 2017-12-29 (×11): 5000 [IU] via SUBCUTANEOUS
  Filled 2017-12-25 (×6): qty 1

## 2017-12-25 MED ORDER — ALBUTEROL SULFATE (2.5 MG/3ML) 0.083% IN NEBU: 2.5000 mg | INHALATION_SOLUTION | RESPIRATORY_TRACT | Status: DC | PRN

## 2017-12-25 MED ORDER — PROMETHAZINE HCL 25 MG/ML IJ SOLN
12.5000 mg | Freq: Once | INTRAMUSCULAR | Status: AC
  Administered 2017-12-25: 12.5 mg via INTRAMUSCULAR
  Filled 2017-12-25: qty 1

## 2017-12-25 MED ORDER — PROMETHAZINE HCL 25 MG PO TABS: 12.5000 mg | ORAL_TABLET | Freq: Four times a day (QID) | ORAL | Status: DC | PRN

## 2017-12-25 MED ORDER — HYDRALAZINE HCL 20 MG/ML IJ SOLN
5.0000 mg | Freq: Four times a day (QID) | INTRAMUSCULAR | Status: DC | PRN
  Administered 2017-12-25 – 2017-12-27 (×2): 5 mg via INTRAVENOUS
  Filled 2017-12-25 (×2): qty 1

## 2017-12-25 MED ORDER — CALCITRIOL 0.25 MCG PO CAPS: 0.2500 ug | ORAL_CAPSULE | Freq: Every day | ORAL | Status: DC

## 2017-12-25 MED ORDER — PROMETHAZINE HCL 25 MG/ML IJ SOLN
12.5000 mg | Freq: Four times a day (QID) | INTRAMUSCULAR | Status: DC | PRN
  Administered 2017-12-25 – 2017-12-26 (×2): 12.5 mg via INTRAVENOUS
  Filled 2017-12-25 (×2): qty 1

## 2017-12-25 MED ORDER — CALCIUM ACETATE (PHOS BINDER) 667 MG PO CAPS
1334.0000 mg | ORAL_CAPSULE | Freq: Three times a day (TID) | ORAL | Status: DC
  Filled 2017-12-25 (×2): qty 2

## 2017-12-25 MED ORDER — ACETAMINOPHEN 325 MG PO TABS
650.0000 mg | ORAL_TABLET | Freq: Four times a day (QID) | ORAL | Status: DC | PRN
Start: 2017-12-25 — End: 2017-12-30

## 2017-12-25 NOTE — Progress Notes (Deleted)
   CC: ***  HPI:  [name] is a 77 y.o.   Past Medical History:  Diagnosis Date  . Anemia   . Aortic stenosis   . Bacterial sinusitis 09/17/2011  . CHF (congestive heart failure) ([place])   . CKD (chronic kidney disease) stage 4, GFR 15-29 ml/min ([place]) 08/11/2006   Cr continues to increase. Proteinuria on UA 02/10/12.    . Colitis   . CVA (cerebrovascular accident) [facility])    New hemorrhagic per CT scan '09  . Diverticulosis of colon   . Dysfunctional uterine bleeding   . ESRD (end stage renal disease) on dialysis ([place])    "MWF; [facility]" (11/27/2017)  . Fecal impaction ([place])   . Headache(784.0)   . Heart murmur   . HERNIORRHAPHY, HX OF 08/11/2006  . Hypertension   . OA (osteoarthritis)    bilateral knees  . Postmenopausal   . Pulmonary nodule   . TINEA CRURIS 01/12/2007   Review of Systems:  ***  Physical Exam:  Vitals:   12/25/17 1419  BP: (!) 194/74  Pulse: (!) 111  Temp: 97.8 F (36.6 C)  TempSrc: Oral  SpO2: 100%  Weight: 129 lb 3.2 oz (58.6 kg)  Height: 5\' 3"  (1.6 m)   ***  Assessment & Plan:   See Encounters Tab for problem based charting.  Patient {GC/GE:3044014::"discussed with","seen with"} Dr. {NAMES:3044014::"Butcher","Granfortuna","E. Hoffman","Klima","Mullen","Narendra","Raines","Vincent"}

## 2017-12-25 NOTE — Telephone Encounter (Signed)
Thank you [name]. I agree that she needs to be evaluated

## 2017-12-25 NOTE — Progress Notes (Signed)
Patient BP is 202/77, MD was notified. Medications are not yet release from pharmacy. They need to be verified.

## 2017-12-25 NOTE — Progress Notes (Addendum)
CC: nausea/vomiting  HPI:  [name] is a 77 y.o. female with past medical history as listed below including ESRD on HD, HTN, CVA who presents for follow-up evaluation of nausea and vomiting.  Please see problem based charting for status of patient's chronic medical issues.  Non-intractable vomiting with nausea Readmitted from 6/10-6/14/2019 for recurrent nausea and vomiting.  These were noted to occur with dialysis.  She is pretreated with IV Phenergan and discharged on oral Phenergan to take prior to dialysis.  She was seen in follow-up on 12/23/2017 in clinic and reported doing well and tolerating dialysis with oral Phenergan prior to her session.  She did report her blood pressure dropping with dialysis and had a deep in the recumbent position at that time.  She returns today with recurrent nausea and vomiting which began yesterday during dialysis.  She felt like to much fluid was being pulled off and she developed nausea before the end of her session which was ended early.  She says since dialysis she has had continued nausea and vomiting throughout the night and this morning.  She has not tried her Phenergan since this started she was afraid she would not be able to keep it down.  She says she has not been able to keep food down.  She reports associated generalized abdominal pain and low appetite.  She says she is feels too poorly to attend her next dialysis session tomorrow.  She denies any diarrhea, constipation, fevers, chest pain.  On arrival to clinic she was noted to be hypertensive at 194/74.  She felt a little lightheaded when she stood up.  Orthostatic vitals are as follows: Lying: BP 196/73, Pulse 105 Sitting: BP 191/79, Pulse 108 Standing: 173/68, Pulse 113 Of note patient denied any lightheadedness or dizziness with orthostatic vitals per nursing.  A/P: Patient with recurrent nausea and vomiting which at times is intractable and temporally related to her dialysis  sessions.  States she has not been able to keep her food down and is afraid she cannot keep her medications down either.  She is hypertensive and did have a >20 point drop in her systolic blood pressure from lying to standing.  She has some generalized abdominal tenderness to palpation.  She has an AV fistula on her left upper extremity with good thrill.  Given her high likelihood to miss dialysis tomorrow as well as going without dialysis for the weekend I have elected to admit her for further care of her nausea and vomiting.  Of note she was recently evaluated for mesenteric ischemia with a mesenteric Doppler during her last admission which showed a patent celiac artery and SMA, however the IMA was not visualized.  I discussed her situation with the admitting team will have agreed to accept her for admission. -Admit for recurrent nausea vomiting -IM Phenergan given in clinic -CBC and CMET collected    Past Medical History:  Diagnosis Date  . Anemia   . Aortic stenosis   . Bacterial sinusitis 09/17/2011  . CHF (congestive heart failure) ([place])   . CKD (chronic kidney disease) stage 4, GFR 15-29 ml/min ([place]) 08/11/2006   Cr continues to increase. Proteinuria on UA 02/10/12.    . Colitis   . CVA (cerebrovascular accident) [facility])    New hemorrhagic per CT scan '09  . Diverticulosis of colon   . Dysfunctional uterine bleeding   . ESRD (end stage renal disease) on dialysis ([place])    "MWF; [facility]" (11/27/2017)  .  Fecal impaction ([place])   . Headache(784.0)   . Heart murmur   . HERNIORRHAPHY, HX OF 08/11/2006  . Hypertension   . OA (osteoarthritis)    bilateral knees  . Postmenopausal   . Pulmonary nodule   . TINEA CRURIS 01/12/2007   Review of Systems:   Review of Systems  Constitutional: Negative for fever.  Respiratory: Negative for shortness of breath.   Cardiovascular: Negative for chest pain.  Gastrointestinal: Positive for abdominal pain, nausea and vomiting. Negative for constipation  and diarrhea.  Genitourinary:       Anuric  Musculoskeletal: Negative for falls.  Neurological: Negative for loss of consciousness.       Lightheaded when standing     Physical Exam:  Vitals:   12/25/17 1419  BP: (!) 194/74  Pulse: (!) 111  Temp: 97.8 F (36.6 C)  TempSrc: Oral  SpO2: 100%  Weight: 129 lb 3.2 oz (58.6 kg)  Height: 5\' 3"  (1.6 m)   Physical Exam  Constitutional: She is oriented to person, place, and time.  HENT:  Head: Normocephalic and atraumatic.  Slightly dry mucosa  Cardiovascular: Normal rate and regular rhythm.  3/6 systolic flow murmur, LUE AVF with palpable thrill  Pulmonary/Chest: Effort normal. No respiratory distress. She has no wheezes. She has no rales.  Abdominal: Soft. Bowel sounds are normal. She exhibits no distension.  Generalized tenderness to palpation without guarding  Musculoskeletal: She exhibits no edema.  Neurological: She is alert and oriented to person, place, and time.  Skin: Skin is warm. She is not diaphoretic.  Normal skin turgor     Assessment & Plan:   See Encounters Tab for problem based charting.  Patient seen with Dr. Beryle Beams   Medicine attending: Medical history, presenting problems, physical findings, and medications, reviewed with resident physician Dr Zada Finders on the day of the patient visit and I concur with his evaluation and management plan. Complicated lady with diabetes, ESRD on dialysis, presents with recurrent vomiting and abdominal pain just 1 week after an admission for similar symptoms. No peritoneal signs. She will need to be re-admitted.

## 2017-12-25 NOTE — Telephone Encounter (Signed)
Pt's daughter calls and states that pt has been having N&V since dialysis 6/19, not keeping anything down, she has not called dialysis. appt [facility] at [id number] today

## 2017-12-25 NOTE — Assessment & Plan Note (Signed)
Readmitted from 6/10-6/14/2019 for recurrent nausea and vomiting.  These were noted to occur with dialysis.  She is pretreated with IV Phenergan and discharged on oral Phenergan to take prior to dialysis.  She was seen in follow-up on 12/23/2017 in clinic and reported doing well and tolerating dialysis with oral Phenergan prior to her session.  She did report her blood pressure dropping with dialysis and had a deep in the recumbent position at that time.  She returns today with recurrent nausea and vomiting which began yesterday during dialysis.  She felt like to much fluid was being pulled off and she developed nausea before the end of her session which was ended early.  She says since dialysis she has had continued nausea and vomiting throughout the night and this morning.  She has not tried her Phenergan since this started she was afraid she would not be able to keep it down.  She says she has not been able to keep food down.  She reports associated generalized abdominal pain and low appetite.  She says she is feels too poorly to attend her next dialysis session tomorrow.  She denies any diarrhea, constipation, fevers, chest pain.  On arrival to clinic she was noted to be hypertensive at 194/74.  She felt a little lightheaded when she stood up.  Orthostatic vitals are as follows: Lying: BP 196/73, Pulse 105 Sitting: BP 191/79, Pulse 108 Standing: 173/68, Pulse 113 Of note patient denied any lightheadedness or dizziness with orthostatic vitals per nursing.  A/P: Patient with recurrent nausea and vomiting which at times is intractable and temporally related to her dialysis sessions.  States she has not been able to keep her food down and is afraid she cannot keep her medications down either.  She is hypertensive and did have a >20 point drop in her systolic blood pressure from lying to standing.  She has some generalized abdominal tenderness to palpation.  She has an AV fistula on her left upper extremity  with good thrill.  Given her high likelihood to miss dialysis tomorrow as well as going without dialysis for the weekend I have elected to admit her for further care of her nausea and vomiting.  Of note she was recently evaluated for mesenteric ischemia with a mesenteric Doppler during her last admission which showed a patent celiac artery and SMA, however the IMA was not visualized.  I discussed her situation with the admitting team will have agreed to accept her for admission. -Admit for recurrent nausea vomiting -IM Phenergan given in clinic -CBC and CMET collected

## 2017-12-25 NOTE — Progress Notes (Signed)
New Admission Note:   Arrival Method:  Wheelchair  Mental Orientation: Alert and oriented x4  Telemetry: Assessment: Completed Skin: no issues IV: no IV present as yet, IV team tried was unsuceessful . They will send someone up  Pain: 0/10  Tubes: none  Safety Measures: Safety Fall Prevention Plan has been given, discussed and signed Admission: Completed [facility] Orientation: Patient has been orientated to the room, unit and staff.  Family: Daughter   Orders have been reviewed and implemented. Will continue to monitor the patient. Call light has been placed within reach and bed alarm has been activated.   Kenzleigh Sedam RN [facility] Phone: [phone]

## 2017-12-25 NOTE — H&P (Signed)
Date: 12/25/2017               Patient Name:  Nancy [name] MRN: [id number]  DOB: 12-13-1940 Age / Sex: 77 y.o., female   PCP: Aldine Contes, MD         Medical Service: Internal Medicine Teaching Service         Attending Physician: Dr. Aldine Contes, MD    First Contact: Dr. Trilby Drummer Pager: [phone]  Second Contact: Dr. Danford Bad Pager: [phone]       After Hours (After 5p/  First Contact Pager: [phone]  weekends / holidays): Second Contact Pager: [phone]   Chief Complaint: Intractable nausea and vomtting  History of Present Illness: Ms Tomkinson is a 77 yo F with Hx of ESRD on HD MWF, HTN, CVA and recent admission for intractable nausea and vomiting with HD who presented to clinic with recurrence of intractable nausea and vomiting. She states that she had been doing well with taking prophylactic phenergan prior to her HD sessions since discharge on 6/14. She was seen in clinic 2 days ago and had not had any problems with dialysis at that time. However, at her session on 6/19, she felt that too much fluid was pulled off and she became nauseous about 2.5 hours in to her session so she stopped early. She states she started vomiting when she returned home with 5 episodes yesterday and many episodes this morning. She endorses some associated abdominal pain with vomiting. She stated she would be unable to keep food or medications down and did not feel she would be able to make it to her dialysis appointment tomorrow. She denies fevers, chills, lightheadedness, constipation, diarrhea, or pain.  In the clinic: - Vitals: BP 194/74 (with >76 drop systolic lying to standing); HR 111; otherwise stable - Labs: CMP and CBC ordered - Interventions: IM phenergan   Patient to be admitted for further workup and care  Meds: No current facility-administered medications on file prior to encounter.    Current Outpatient Medications on File Prior to Encounter  Medication Sig  . acetaminophen  (TYLENOL) 500 MG tablet Take 2 tablets (1,000 mg total) by mouth every 8 (eight) hours as needed for mild pain, moderate pain, fever or headache.  Derrill Memo ON 12/27/2017] amLODipine (NORVASC) 5 MG tablet Take 1 tablet (5 mg total) by mouth daily.  Marland Kitchen aspirin EC 81 MG EC tablet Take 1 tablet (81 mg total) by mouth daily. (Patient not taking: Reported on 11/27/2017)  . calcitRIOL (ROCALTROL) 0.25 MCG capsule Take 1 capsule (0.25 mcg total) by mouth daily.  . calcium acetate (PHOSLO) 667 MG capsule Take 2 capsules (1,334 mg total) by mouth 3 (three) times daily with meals.  . cloNIDine (CATAPRES - DOSED IN MG/24 HR) 0.1 mg/24hr patch Place 1 patch (0.1 mg total) onto the skin every 7 (seven) days for 3 days.  Marland Kitchen lidocaine-prilocaine (EMLA) cream Apply 1 application topically 3 (three) times a week. APPLY SMALL AMOUNT TO ACCESS SITE (AVF) 3 TIMES A WEEK 1 HOUR BEFORE DIALYSIS. COVER WITH OCCLUSIVE DRESSING (SARAN WRAP)  . nitroGLYCERIN (NITROSTAT) 0.3 MG SL tablet Place 1 tablet (0.3 mg total) under the tongue every 5 (five) minutes as needed for chest pain. (Patient not taking: Reported on 12/24/2017)  . polyethylene glycol (MIRALAX / GLYCOLAX) packet Take 17 g by mouth daily. (Patient not taking: Reported on 11/27/2017)  . promethazine (PHENERGAN) 12.5 MG tablet Take 1 tablet (12.5 mg total) by mouth every 6 (six)  hours as needed for nausea or vomiting (Take 1 prior to dialysis).  Marland Kitchen senna-docusate (SENOKOT-S) 8.6-50 MG tablet Take 1 tablet by mouth at bedtime as needed for mild constipation or moderate constipation.  . SENSIPAR 60 MG tablet Take 1 tablet (60 mg total) by mouth daily. (Patient not taking: Reported on 11/27/2017)    Allergies: Allergies as of 12/25/2017 - Review Complete 12/25/2017  Allergen Reaction Noted  . Hydrocodone-acetaminophen Nausea And Vomiting and Other (See Comments)    Past Medical History:  Diagnosis Date  . Anemia   . Aortic stenosis   . Bacterial sinusitis 09/17/2011  .  CHF (congestive heart failure) ([place])   . CKD (chronic kidney disease) stage 4, GFR 15-29 ml/min ([place]) 08/11/2006   Cr continues to increase. Proteinuria on UA 02/10/12.    . Colitis   . CVA (cerebrovascular accident) [facility])    New hemorrhagic per CT scan '09  . Diverticulosis of colon   . Dysfunctional uterine bleeding   . ESRD (end stage renal disease) on dialysis ([place])    "MWF; [facility]" (11/27/2017)  . Fecal impaction ([place])   . Headache(784.0)   . Heart murmur   . HERNIORRHAPHY, HX OF 08/11/2006  . Hypertension   . OA (osteoarthritis)    bilateral knees  . Postmenopausal   . Pulmonary nodule   . TINEA CRURIS 01/12/2007    Family History: Family History  Problem Relation Age of Onset  . Hypertension Mother   . Congestive Heart Failure Mother   . Heart attack Brother 62  - Reviewed on admission  Social History:  Social History   Tobacco Use  . Smoking status: Never Smoker  . Smokeless tobacco: Never Used  Substance Use Topics  . Alcohol use: No    Alcohol/week: 0.0 oz  . Drug use: No    Comment: 08/15/08 UDS + cocaine  - Reviewed on admission  Review of Systems: A complete ROS was negative except as per HPI.  Physical Exam: There were no vitals taken for this visit. Physical Exam  Constitutional: She is oriented to person, place, and time. She appears well-developed and well-nourished.  HENT:  Head: Normocephalic and atraumatic.  Eyes: EOM are normal. Right eye exhibits no discharge. Left eye exhibits no discharge.  Cardiovascular: Regular rhythm.  Murmur heard. Systolic murmur  Pulmonary/Chest: Effort normal and breath sounds normal. No respiratory distress.  Abdominal: Soft. Bowel sounds are normal. She exhibits no distension.  Musculoskeletal: She exhibits no edema or deformity.  Neurological: She is alert and oriented to person, place, and time.  Skin: Skin is warm and dry.  Psychiatric: She has a normal mood and affect. Her behavior is normal.   Assessment  & Plan by Problem: 77 yo F with Hx of ESRD on HD MWF, HTN, CVA and recent admission for intractable nausea and vomiting with HD who presented to clinic with recurrence of intractable nausea and vomiting.  Abdominal tenderness Intractable nausea and vomiting: Patient with history of intractable nausea and vomiting associated with HD. She was admitted 6/10-6/14 for this and improved with phenergan. She had mesenteric doppler performed showing patent celiac artery and SMA with IMA not visualized. Was discharge with phenergan to be taken prophylactically prior to HD, which worked well for her for about 1 week. However, at her session on 6/19, she felt that too much fluid was pulled off and she became nauseous about 2.5 hours in to her session so she stopped early. She states she started vomiting when she  returned home with 5 episodes yesterday and many episodes this morning. - Phenergan q6h PRN - Phenergan PRN Prior to HD sessions > CMP and CBC pending  ESRD on HD MWF: - Consult nephrology for HD tomorrow - AM CMP  HTN: BP elevated to 216K Systolic in clinic. On Clonidine 0.1mg  patch Weekly and Amlodipine 5mg  Daily at  Home. Patient noted to be orthostatic (without symptoms) in the clinic, will repeat orthostatics tomorrow. > Expect BP to improve with Dialysis - Continue Clonidine 0.1mg  patch Weekly and Amlodipine 5mg  Daily - PRN Hydralazine for BP >446 Systolic   FEN: Full liquid, advance as tolerated to [place] VTE ppx: Heparin Code Status: DNR   Dispo: Admit patient to Inpatient with expected length of stay greater than 2 midnights.  Signed: Neva Seat, MD 12/25/2017, 5:01 PM  Pager: [phone]

## 2017-12-26 ENCOUNTER — Observation Stay (HOSPITAL_COMMUNITY): Payer: Medicare Other

## 2017-12-26 ENCOUNTER — Inpatient Hospital Stay (HOSPITAL_COMMUNITY): Payer: Medicare Other

## 2017-12-26 ENCOUNTER — Other Ambulatory Visit: Payer: Self-pay | Admitting: Pharmacist

## 2017-12-26 DIAGNOSIS — Z992 Dependence on renal dialysis: Secondary | ICD-10-CM

## 2017-12-26 DIAGNOSIS — Z66 Do not resuscitate: Secondary | ICD-10-CM

## 2017-12-26 DIAGNOSIS — R011 Cardiac murmur, unspecified: Secondary | ICD-10-CM | POA: Diagnosis not present

## 2017-12-26 DIAGNOSIS — I120 Hypertensive chronic kidney disease with stage 5 chronic kidney disease or end stage renal disease: Secondary | ICD-10-CM

## 2017-12-26 DIAGNOSIS — Z79899 Other long term (current) drug therapy: Secondary | ICD-10-CM

## 2017-12-26 DIAGNOSIS — Z8673 Personal history of transient ischemic attack (TIA), and cerebral infarction without residual deficits: Secondary | ICD-10-CM

## 2017-12-26 DIAGNOSIS — R112 Nausea with vomiting, unspecified: Secondary | ICD-10-CM

## 2017-12-26 DIAGNOSIS — N186 End stage renal disease: Secondary | ICD-10-CM | POA: Diagnosis not present

## 2017-12-26 DIAGNOSIS — Z885 Allergy status to narcotic agent status: Secondary | ICD-10-CM | POA: Diagnosis not present

## 2017-12-26 LAB — MRSA PCR SCREENING: MRSA by PCR: NEGATIVE

## 2017-12-26 LAB — CBC
HEMATOCRIT: 35.8 % — AB (ref 36.0–46.0)
Hemoglobin: 11 g/dL — ABNORMAL LOW (ref 12.0–15.0)
MCH: 31.3 pg (ref 26.0–34.0)
MCHC: 30.7 g/dL (ref 30.0–36.0)
MCV: 101.7 fL — AB (ref 78.0–100.0)
Platelets: 183 10*3/uL (ref 150–400)
RBC: 3.52 MIL/uL — ABNORMAL LOW (ref 3.87–5.11)
RDW: 18.5 % — ABNORMAL HIGH (ref 11.5–15.5)
WBC: 7.3 10*3/uL (ref 4.0–10.5)

## 2017-12-26 LAB — COMPREHENSIVE METABOLIC PANEL
ALBUMIN: 3.2 g/dL — AB (ref 3.5–5.0)
ALT: 11 U/L — AB (ref 14–54)
AST: 18 U/L (ref 15–41)
Alkaline Phosphatase: 47 U/L (ref 38–126)
Anion gap: 15 (ref 5–15)
BILIRUBIN TOTAL: 0.8 mg/dL (ref 0.3–1.2)
BUN: 25 mg/dL — ABNORMAL HIGH (ref 6–20)
CHLORIDE: 96 mmol/L — AB (ref 101–111)
CO2: 27 mmol/L (ref 22–32)
CREATININE: 7.16 mg/dL — AB (ref 0.44–1.00)
Calcium: 10 mg/dL (ref 8.9–10.3)
GFR calc Af Amer: 6 mL/min — ABNORMAL LOW (ref >60)
GFR calc non Af Amer: 5 mL/min — ABNORMAL LOW (ref >60)
GLUCOSE: 102 mg/dL — AB (ref 65–99)
POTASSIUM: 3.7 mmol/L (ref 3.5–5.1)
Sodium: 138 mmol/L (ref 135–145)
Total Protein: 6.5 g/dL (ref 6.5–8.1)

## 2017-12-26 MED ORDER — LIDOCAINE HCL (PF) 1 % IJ SOLN: 5.0000 mL | INTRAMUSCULAR | Status: DC | PRN

## 2017-12-26 MED ORDER — CHLORHEXIDINE GLUCONATE CLOTH 2 % EX PADS: 6.0000 | MEDICATED_PAD | Freq: Every day | CUTANEOUS | Status: DC

## 2017-12-26 MED ORDER — PROMETHAZINE HCL 25 MG PO TABS
ORAL_TABLET | ORAL | Status: AC
  Filled 2017-12-26: qty 1

## 2017-12-26 MED ORDER — PROMETHAZINE HCL 25 MG/ML IJ SOLN
INTRAMUSCULAR | Status: AC
  Filled 2017-12-26: qty 1

## 2017-12-26 MED ORDER — SODIUM CHLORIDE 0.9 % IV SOLN: 100.0000 mL | INTRAVENOUS | Status: DC | PRN

## 2017-12-26 MED ORDER — FERRIC CITRATE 1 GM 210 MG(FE) PO TABS
210.0000 mg | ORAL_TABLET | Freq: Three times a day (TID) | ORAL | Status: DC
  Administered 2017-12-26 – 2017-12-30 (×8): 210 mg via ORAL
  Filled 2017-12-26 (×12): qty 1

## 2017-12-26 MED ORDER — CALCITRIOL 0.5 MCG PO CAPS
ORAL_CAPSULE | ORAL | Status: AC
  Filled 2017-12-26: qty 4

## 2017-12-26 MED ORDER — CINACALCET HCL 30 MG PO TABS: 60.0000 mg | ORAL_TABLET | ORAL | Status: DC

## 2017-12-26 MED ORDER — CALCITRIOL 0.5 MCG PO CAPS
2.0000 ug | ORAL_CAPSULE | ORAL | Status: DC
  Administered 2017-12-29: 2 ug via ORAL

## 2017-12-26 MED ORDER — PENTAFLUOROPROP-TETRAFLUOROETH EX AERO: 1.0000 "application " | INHALATION_SPRAY | CUTANEOUS | Status: DC | PRN

## 2017-12-26 MED ORDER — LIDOCAINE-PRILOCAINE 2.5-2.5 % EX CREA: 1.0000 "application " | TOPICAL_CREAM | CUTANEOUS | Status: DC | PRN

## 2017-12-26 NOTE — Patient Outreach (Signed)
[facility]) Care Management  12/26/2017  Nancy [name] 1941/01/18 [id number]  77 year old female referred to [facility] from MD after recent hospitalization for coordination of care services, medication assistance, and medication reconciliation.    Noted patient admitted 12/25/2017 with intractable N/V.   I will follow-up with [facility] inpatient liaison to determine when to follow-up with patient upon discharge.   Ralene Bathe, PharmD, [place] [phone]

## 2017-12-26 NOTE — Progress Notes (Addendum)
[facility] Progress Note   Background: Nancy [name] is a 77 Y/O AA female with ESRD on hemodialysis MWF at [facility]. PMH: ESRD 2/2 HTN, CVA, HF, AOCD, SHPT. Recent Adm 12/15/2017 for intractable nausea vomiting 06/10-06/14/2019 as well as admission 05/23-05/24/19 with nausea/vomiting-symptoms of uremia. She presented to ED 12/25/2017 with complaints of same. Discussed situation with OP dietician who said patient was restarted on Sensipar 11/14/2017. Concerned that sensipar may be causing current GI symptoms. Last HD 12/24/2017 signed off early after 2 hrs 22 minutes. Left 1.3 kg above OP EDW.   Subjective: Seen on HD. C/O nausea, no emesis.   Objective Vitals:   12/26/17 1444 12/26/17 1450 12/26/17 1500 12/26/17 1530  BP: (!) 186/75 (!) 182/78 (!) 196/76 (!) 187/71  Pulse: (!) 107 (!) 105 (!) 106 (!) 107  Resp:  18    Temp:      TempSrc:      SpO2:      Weight:       Physical Exam General: Chronically ill appearing elderly female in NAD Heart: W0,J8 RRR 2/6 systolic M Lungs: CTAB A/P Abdomen: Soft, non-tender Extremities: No LE edema Dialysis Access: LUA AVF cannulated at present   Additional Objective Labs: Basic Metabolic Panel: Recent Labs  Lab 12/25/17 1600 12/26/17 0629  NA 142 138  K 3.7 3.7  CL 95* 96*  CO2 31 27  GLUCOSE 126* 102*  BUN 19 25*  CREATININE 6.53* 7.16*  CALCIUM 10.5* 10.0   Liver Function Tests: Recent Labs  Lab 12/25/17 1600 12/26/17 0629  AST 26 18  ALT 13* 11*  ALKPHOS 54 47  BILITOT 0.8 0.8  PROT 7.1 6.5  ALBUMIN 3.6 3.2*   No results for input(s): LIPASE, AMYLASE in the last 168 hours. CBC: Recent Labs  Lab 12/25/17 1600 12/26/17 0629  WBC 6.3 7.3  HGB 11.7* 11.0*  HCT 38.5 35.8*  MCV 102.7* 101.7*  PLT 196 183   Blood Culture    Component Value Date/Time   SDES BLOOD RIGHT WRIST 10/24/2017 0811   SPECREQUEST  10/24/2017 0811    BOTTLES DRAWN AEROBIC AND ANAEROBIC Blood Culture  results may not be optimal due to an inadequate volume of blood received in culture bottles   CULT  10/24/2017 0811    NO GROWTH 5 DAYS Performed at [facility], [place] [address] [place], [place] [address]    REPTSTATUS 10/29/2017 FINAL 10/24/2017 0811    Cardiac Enzymes: No results for input(s): CKTOTAL, CKMB, CKMBINDEX, TROPONINI in the last 168 hours. CBG: No results for input(s): GLUCAP in the last 168 hours. Iron Studies: No results for input(s): IRON, TIBC, TRANSFERRIN, FERRITIN in the last 72 hours. @lablastinr3 @ Studies/Results: No results found. Medications:  . amLODipine  5 mg Oral Daily  . [START ON 12/29/2017] calcitRIOL  2 mcg Oral Q M,W,F-HD  . calcium acetate  1,334 mg Oral TID WC  . [START ON 12/27/2017] Chlorhexidine Gluconate Cloth  6 each Topical Q0600  . ferric citrate  210 mg Oral TID WC  . heparin injection (subcutaneous)  5,000 Units Subcutaneous Q8H   HD orders: [facility] MWF 4 hrs 180 NRe 300/800 57.5 kg 2.0 K/2.5 Ca LUA AVF -Heparin 3000 units IV TIW -Mircera 100 mcg IV q 2 weeks (last dose 12/24/2017) -Calcitriol 2.0 mcg PO TIW -Sensipar 60 mg PO TIW  Assessment/Plan: 1. Intractable N & V: Patient resumed Sensipar 11/14/2017. 3 admissions since with N & V. Work up so far negative. Will  hold senispar and see if nausea improves. Early sign offs from HD-consider uremia but labs aren't that bad. Discussed with primary.    Sensipar can do this and the timing is right  2. ESRD -MWF HD today on schedule. Will dialyze today  3. Anemia -HGB 11.0 Recent ESA dose 4. Secondary hyperparathyroidism -Hold senispar as noted above. Continue binders, VDRA 5. HTN/volume -Hypertensive on HD. UFG goal set to 1.7 will challenge EDW 0.5 kg and try to get close to OP EDW. No OP anti-hypertensive meds however currently prescribed amlodipine 5 mg and clonidine patch.  6. Nutrition - Full liquid diet at present. Please advance to renal diet-does not need the extra volume.  I  have seen and examined this patient and agree with the plan of care .  Christopherjohn Schiele W 12/26/2017, 5:17 PM     Rita H. Brown NP-C 12/26/2017, 3:40 PM  [employer] [phone]

## 2017-12-26 NOTE — Consult Note (Signed)
   [facility] Inpatient Consult   12/26/2017  Nancy [name] 17-Oct-1940 [id number]  Patient is currently active with [facility] for chronic disease management services.  Patient has been engaged by a Administrator.   Patient verbalized difficulty affording medications and has been referred to a [facility] Pharmacist on 12/24/17.Marland Kitchen  RN Telephonic Care Coordinator had some difficulty maintaining contact with patient as well.  Went by to speak with patient but patient currently off the floor in dialysis.  Our community based plan of care has focused on disease management and community resource support.  Patient will receive a post hospital  calsl and will be evaluated for monthly home visits for assessments and disease process education.  Made Inpatient Case Manager aware that [facility] following. Of note, [facility] Care Management services does not replace or interfere with any services that are needed or arranged by inpatient case management or social work.  For additional questions or referrals please contact:

## 2017-12-26 NOTE — Progress Notes (Signed)
[facility]  Patient Status: Active (receiving services up to time of hospitalization)  [facility] is providing the following services: RN, PT and OT  If patient discharges after hours, please call [phone].   Nancy [name] 12/26/2017, 10:31 AM

## 2017-12-26 NOTE — Progress Notes (Signed)
   Subjective: Nancy [name] was seen resting in her bed this morning. She states that she is feeling nauseous again this morning and has been unable to keep down her breakfast this morning. She still has a good appetite though. She is to have dialysis this morning and was told she would be receiving IV medication for nausea to ensure she could keep it down. She has no further questions or concerns this morning.  Objective:  Vital signs in last 24 hours: Vitals:   12/25/17 1600 12/25/17 1852 12/25/17 2120 12/26/17 0456  BP: (!) 202/77 (!) 175/90 (!) 196/81 (!) 165/75  Pulse: (!) 101 (!) 108 (!) 108 (!) 110  Resp: 18  20 16   Temp: 97.7 F (36.5 C)  98.4 F (36.9 C) 97.7 F (36.5 C)  TempSrc: Oral  Oral Oral  SpO2: 100%  99% 100%  Weight:   129 lb 10.1 oz (58.8 kg)    Physical Exam  Constitutional: She is oriented to person, place, and time. She appears well-developed and well-nourished. No distress.  HENT:  Head: Normocephalic and atraumatic.  Cardiovascular: Regular rhythm and intact distal pulses.  Tachycardia Systolic murmur  Pulmonary/Chest: Effort normal and breath sounds normal. No respiratory distress.  Abdominal: Soft. Bowel sounds are normal. She exhibits no distension. There is no tenderness.  Musculoskeletal: She exhibits no edema or deformity.  Neurological: She is alert and oriented to person, place, and time.  Skin: Skin is warm and dry.   Assessment/Plan: 77 yo F with Hx of ESRD on HD MWF, HTN, CVA and recent admission for intractable nausea and vomiting with HD who presented to clinic with recurrence of intractable nausea and vomiting.  Intractable nausea and vomiting: Patient with history of intractable nausea and vomiting associated with HD. Admitted 6/10-6/14 for this and improved with phenergan. She had mesenteric doppler performed showing patent celiac artery and SMA with IMA not visualized. Was discharge with phenergan to be taken prophylactically prior to HD, which  worked well for her for about 1 week. However, at her session on 6/19, she felt that too much fluid was pulled off and she became nauseous about 2.5 hours in to her session so she stopped early. She states she started vomiting when she returned home with 5 episodes that evening and many episodes the following morning.  > Nasuea initially resolved with IM phenergan, but returned this morning. Will treat with IV phenergan prior to dialysis and observe patient. - Phenergan q6h PRN (PO or IV) - Phenergan PRN Prior to HD sessions  ESRD on HD MWF: - Nephrology following for HD - AM Renal Function Panel  HTN: BP elevated to 157W Systolic in clinic. On Clonidine 0.1mg  patch Weekly and Amlodipine 5mg  Daily at Home. Patient noted to be orthostatic (without symptoms) in the clinic, will repeat orthostatics tomorrow. > Expect BP to improve with Dialysis, This am 620B systolic - Continue Clonidine 0.1mg  patch Weekly and Amlodipine 5mg  Daily - PRN Hydralazine for BP >559 Systolic  FEN: Full liquid, advance as tolerated Renal VTE ppx: Heparin Code Status: DNR   Dispo: Anticipated discharge in approximately 0-2 day(s).   Neva Seat, MD 12/26/2017, 9:14 AM Pager: [phone]

## 2017-12-26 NOTE — Progress Notes (Signed)
  Date: 12/26/2017  Patient name: Nancy [name]  Medical record number: [id number]  Date of birth: [birth year]   I have seen and evaluated Nancy B Laverdure and discussed their care with the Residency Team.  In brief, patient is a 77 year old female with a past medical history of ESRD on hemodialysis, hypertension, CVA and recent admission for intractable nausea and vomiting with hemodialysis who presented to the [facility] with recurrent nausea and vomiting.  Patient was recently admitted for intractable nausea and vomiting and was instructed to take Phenergan prior to her hemodialysis sessions on discharge.  Patient states that she has been compliant with this medication and had been working well for her except on her last hemodialysis session on June 19.  Patient stated that she became nauseous of a 2-1/2 hours into her session so she ended the session early.  Patient states that since then she has had persistent nausea and vomiting and has been unable to keep her food or medications down.  She was given IM Phenergan in the internal medicine clinic which did temporarily improve her nausea.  No chest pain, no palpitations, lightheadedness, diaphoresis, shortness of breath, no abdominal pain, no diarrhea, no headache, no blurry vision, no focal weakness, no recent URI, no fevers or chills.  Patient states that she still feels nauseous this morning and is unable to keep breakfast down.  She denies any other complaints at this time.  PMHx, Fam Hx, and/or Soc Hx : As per resident admit note  Vitals:   12/26/17 0456 12/26/17 1044  BP: (!) 165/75 (!) 167/66  Pulse: (!) 110 (!) 104  Resp: 16 16  Temp: 97.7 F (36.5 C) 97.6 F (36.4 C)  SpO2: 100% 100%   General: Awake, alert, oriented x3, NAD CVS: Tachycardic, systolic murmur noted Lungs: CTA bilaterally Abdomen: Soft, nontender, nondistended, normoactive bowel sounds Extremities: No edema noted  Assessment and Plan: I have seen and evaluated the  patient as outlined above. I agree with the formulated Assessment and Plan as detailed in the residents' note, with the following changes:   1.  Intractable nausea and vomiting: Patient with a history of intractable nausea and vomiting associated with her hemodialysis sessions and was recently admitted from June 10 to June 14 with similar complaints.  At that time she had a mesenteric Doppler performed which showed patent celiac and SMA arteries and no evidence of mesenteric stenosis.  Patient returned with recurrent nausea and vomiting despite taking Phenergan prior to her hemodialysis session.  The etiology of her current nausea and vomiting remains uncertain at this time and is likely secondary to her intolerance of hemodialysis. -We will obtain abdominal x-ray to rule out other etiologies of nausea and vomiting like an ileus or obstruction -We will continue with Phenergan as needed for nausea and vomiting -Nephrology to follow-up for hemodialysis recommendations -Patient is also noted to be hypertensive on admission with SBP's in the 190s on admission which have improved to 160s today. -Continue clonidine 0.1 mg patch weekly and amlodipine 5 mg daily -No further work-up at this time -Patient will likely be discharged home over the weekend once her symptoms improve   Aldine Contes, MD 6/21/201911:34 AM

## 2017-12-27 DIAGNOSIS — N186 End stage renal disease: Secondary | ICD-10-CM | POA: Diagnosis not present

## 2017-12-27 DIAGNOSIS — Z885 Allergy status to narcotic agent status: Secondary | ICD-10-CM | POA: Diagnosis not present

## 2017-12-27 DIAGNOSIS — R112 Nausea with vomiting, unspecified: Secondary | ICD-10-CM | POA: Diagnosis not present

## 2017-12-27 DIAGNOSIS — Z8673 Personal history of transient ischemic attack (TIA), and cerebral infarction without residual deficits: Secondary | ICD-10-CM | POA: Diagnosis not present

## 2017-12-27 DIAGNOSIS — Z66 Do not resuscitate: Secondary | ICD-10-CM | POA: Diagnosis not present

## 2017-12-27 DIAGNOSIS — Z992 Dependence on renal dialysis: Secondary | ICD-10-CM | POA: Diagnosis not present

## 2017-12-27 DIAGNOSIS — Z79899 Other long term (current) drug therapy: Secondary | ICD-10-CM | POA: Diagnosis not present

## 2017-12-27 DIAGNOSIS — I120 Hypertensive chronic kidney disease with stage 5 chronic kidney disease or end stage renal disease: Secondary | ICD-10-CM | POA: Diagnosis not present

## 2017-12-27 MED ORDER — AMLODIPINE BESYLATE 10 MG PO TABS
10.0000 mg | ORAL_TABLET | Freq: Every day | ORAL | Status: DC
  Administered 2017-12-28: 10 mg via ORAL
  Filled 2017-12-27: qty 1

## 2017-12-27 MED ORDER — CHLORHEXIDINE GLUCONATE CLOTH 2 % EX PADS: 6.0000 | MEDICATED_PAD | Freq: Every day | CUTANEOUS | Status: DC

## 2017-12-27 MED ORDER — GI COCKTAIL ~~LOC~~
30.0000 mL | Freq: Once | ORAL | Status: AC
  Administered 2017-12-27: 30 mL via ORAL
  Filled 2017-12-27: qty 30

## 2017-12-27 MED ORDER — GI COCKTAIL ~~LOC~~
30.0000 mL | Freq: Three times a day (TID) | ORAL | Status: DC | PRN
  Administered 2017-12-28 (×2): 30 mL via ORAL
  Filled 2017-12-27 (×2): qty 30

## 2017-12-27 MED ORDER — FAMOTIDINE 20 MG PO TABS
20.0000 mg | ORAL_TABLET | Freq: Two times a day (BID) | ORAL | Status: DC
  Administered 2017-12-27 – 2017-12-28 (×3): 20 mg via ORAL
  Filled 2017-12-27 (×3): qty 1

## 2017-12-27 NOTE — Care Management Obs Status (Signed)
[facility] NOTIFICATION   Patient Details  Name: Nancy [name] MRN: [id number] Date of Birth: 08-10-1940   Medicare Observation Status Notification Given:  Yes    Carles Collet, RN 12/27/2017, 7:59 AM

## 2017-12-27 NOTE — Progress Notes (Signed)
   Subjective: Nancy [name] was seen resting in her bed this morning. Notes an episode of nausea/emesis after eating grits this AM and lower abdominal pain which she states "feels like she's having a baby." Was apparently doing well until this AM when she was given a medication through the IV (seems to be her prn Hydral).  Objective:  Vital signs in last 24 hours: Vitals:   12/27/17 0418 12/27/17 0535 12/27/17 0608 12/27/17 0726  BP: (!) 182/85 (!) 154/73 (!) 176/76 (!) 171/74  Pulse: (!) 111 (!) 111 (!) 109 (!) 104  Resp: 16   18  Temp: 98.4 F (36.9 C)   98.7 F (37.1 C)  TempSrc: Oral   Oral  SpO2: 100%   100%  Weight:      Height:       Physical Exam  Constitutional: She is oriented to person, place, and time. She appears well-developed and well-nourished. No distress.  HENT:  Head: Normocephalic and atraumatic.  Cardiovascular: Regular rhythm and intact distal pulses.  Tachycardia Systolic murmur  Pulmonary/Chest: Effort normal and breath sounds normal. No respiratory distress.  Abdominal: Soft. Bowel sounds are normal. She exhibits no distension. There is no tenderness.  Musculoskeletal: She exhibits no edema or deformity.  Neurological: She is alert and oriented to person, place, and time.  Skin: Skin is warm and dry.   Assessment/Plan: 77 yo F with Hx of ESRD on HD MWF, HTN, CVA and recent admission for intractable nausea and vomiting with HD who presented to clinic with recurrence of intractable nausea and vomiting. Patient with history of intractable nausea and vomiting associated with HD. Admitted 6/10-6/14 for this and improved with phenergan. She had mesenteric doppler performed showing patent celiac artery and SMA with IMA not visualized. Was discharge with phenergan to be taken prophylactically prior to HD, which worked well for her for about 1 week. However, at her session on 6/19, she felt that too much fluid was pulled off and she became nauseous about 2.5 hours in to  her session so she stopped early. She states she started vomiting when she returned home with 5 episodes that evening and many episodes the following morning.   Intractable nausea and vomiting: Patient felt well overnight but had N/V/abdominal pain which she feels started after getting her IV prn Hydral for hypertension. Nephrology noted addition of Sensipar around the time her initial N/V started which has been held. I do see that she was on this medication previously without recorded side-effects. Given her ongoing symptoms, Phoslo has been dc today. Still unclear what is the etiology behind her recurrent nausea and vomiting. I see from chart review she does have history of GERD/gastritis which was also associated with similar symptoms. Will restart her Famotidine and see if this helps. Have also ordered GI cocktail.  - Continue holding Sensipar, hold Phoslo - Phenergan q6h PRN (PO or IV) - Phenergan PRN Prior to HD sessions - Trial of Famotidine 20mg  BID and GI cocktail for ?gastritis  ESRD on HD MWF: - Nephrology following for HD - AM Renal Function Panel  HTN: BP remains elevated. On Clonidine 0.1mg  patch Weekly and Amlodipine 5mg  Daily at Home. Orthostatic on admission.  - Increase Amlodipine to 10mg  - Continue Clonidine 0.1mg  Qweekly and PRN Hydral - Expect BP to improve with hd  FEN: Renal VTE ppx: Heparin Code Status: DNR   Dispo: Anticipated discharge in approximately 1-2 day(s).   Oreta Soloway, DO 12/27/2017, 10:02 AM Pager: [phone]

## 2017-12-27 NOTE — Progress Notes (Addendum)
[facility] Progress Note   Subjective:   Seen in room. Still with nausea and vomited this morning after eating grits. C/o abd pains "feels like I'm having a baby." No CP/dyspnea.      Objective Vitals:   12/27/17 0418 12/27/17 0535 12/27/17 0608 12/27/17 0726  BP: (!) 182/85 (!) 154/73 (!) 176/76 (!) 171/74  Pulse: (!) 111 (!) 111 (!) 109 (!) 104  Resp: 16   18  Temp: 98.4 F (36.9 C)   98.7 F (37.1 C)  TempSrc: Oral   Oral  SpO2: 100%   100%  Weight:      Height:       Physical Exam General: Frail appearing woman, NAD Heart: RRR; 2/6 SEM Lungs: CTAB Abdomen: soft, non-tender Extremities: No LE edema Dialysis Access: LUE AVF + thrill  Additional Objective Labs: Basic Metabolic Panel: Recent Labs  Lab 12/25/17 1600 12/26/17 0629  NA 142 138  K 3.7 3.7  CL 95* 96*  CO2 31 27  GLUCOSE 126* 102*  BUN 19 25*  CREATININE 6.53* 7.16*  CALCIUM 10.5* 10.0   Liver Function Tests: Recent Labs  Lab 12/25/17 1600 12/26/17 0629  AST 26 18  ALT 13* 11*  ALKPHOS 54 47  BILITOT 0.8 0.8  PROT 7.1 6.5  ALBUMIN 3.6 3.2*   CBC: Recent Labs  Lab 12/25/17 1600 12/26/17 0629  WBC 6.3 7.3  HGB 11.7* 11.0*  HCT 38.5 35.8*  MCV 102.7* 101.7*  PLT 196 183   Studies/Results: Dg Abd Portable 1v  Result Date: 12/26/2017 CLINICAL DATA:  Nausea and vomiting EXAM: PORTABLE ABDOMEN - 1 VIEW COMPARISON:  12/16/2017, CT 10/24/2017, chest x-ray 12/16/2017 FINDINGS: Curvilinear calcifications over the mediastinum presumably represent aortic vascular calcification. Cardiomegaly. Few gas-filled but nonenlarged left mid abdominal small bowel loops with scattered colon gas and rectal gas present. No radiopaque calculi over the kidneys. Coarse calcification in the right pelvis presumably a fibroid. Surgical clips in the right upper quadrant IMPRESSION: Overall nonobstructed gas pattern Electronically Signed   By: Donavan Foil M.D.   On: 12/26/2017 20:55    Medications:  . amLODipine  5 mg Oral Daily  . [START ON 12/29/2017] calcitRIOL  2 mcg Oral Q M,W,F-HD  . calcium acetate  1,334 mg Oral TID WC  . ferric citrate  210 mg Oral TID WC  . heparin injection (subcutaneous)  5,000 Units Subcutaneous Q8H   Dialysis Orders: [facility] MWF 4 hrs 180 NRe 300/800 57.5 kg 2.0 K/2.5 Ca LUA AVF -Heparin 3000 units IV TIW -Mircera 100 mcg IV q 2 weeks (last dose 12/24/2017) -Calcitriol 2.0 mcg PO TIW -Sensipar 60 mg PO TIW  Assessment/Plan: 1. Intractable N & V: Unclear cause, ?possibly med induced - sensipar on hold. Multiple admits for same issue, w/u negative.  Could be due to sensipar although patient is still having nausea this morning  2. ESRD: Continue HD per MWF schedule, next 6/24.  3. Anemia -HGB 11.0 Recent ESA dose 4. Secondary hyperparathyroidism : Corr Ca high, will d/c Phoslo - continue Auryxia as binder only. Agree with minimizing calcium binder use Sensipar on hold. 5. HTN/volume: BP remains high, got down to 57.1kg s/p last HD. ?Consider ^ amlodipine again, although in past this has led to be bottoming out (was on clonidine prev as well). 6. Nutrition: Vomiting after breakfast this morning. Per primary.  I have seen and examined this patient and agree with the plan of care  [facility] W 12/27/2017, 10:45 AM  Veneta Penton, PA-C 12/27/2017, 10:10 AM  [employer] Pager: [phone]

## 2017-12-27 NOTE — Progress Notes (Signed)
Patients BP this am elevated at 182/85. RN administered PRN Hydralzine 5mg .  BP rechecked: 154/73.

## 2017-12-27 NOTE — Care Management CC44 (Signed)
Condition Code 44 Documentation Completed  Patient Details  Name: [place] B Pruitt MRN: [id number] Date of Birth: 1940/11/09   Condition Code 44 given:  Yes Patient signature on Condition Code 44 notice:  Yes Documentation of 2 MD's agreement:  Yes Code 44 added to claim:  Yes    Carles Collet, RN 12/27/2017, 7:59 AM

## 2017-12-28 DIAGNOSIS — R1032 Left lower quadrant pain: Secondary | ICD-10-CM

## 2017-12-28 DIAGNOSIS — Z992 Dependence on renal dialysis: Secondary | ICD-10-CM | POA: Diagnosis not present

## 2017-12-28 DIAGNOSIS — R1031 Right lower quadrant pain: Secondary | ICD-10-CM

## 2017-12-28 DIAGNOSIS — R011 Cardiac murmur, unspecified: Secondary | ICD-10-CM | POA: Diagnosis not present

## 2017-12-28 DIAGNOSIS — I120 Hypertensive chronic kidney disease with stage 5 chronic kidney disease or end stage renal disease: Secondary | ICD-10-CM | POA: Diagnosis not present

## 2017-12-28 DIAGNOSIS — Z8673 Personal history of transient ischemic attack (TIA), and cerebral infarction without residual deficits: Secondary | ICD-10-CM | POA: Diagnosis not present

## 2017-12-28 DIAGNOSIS — Z885 Allergy status to narcotic agent status: Secondary | ICD-10-CM | POA: Diagnosis not present

## 2017-12-28 DIAGNOSIS — Z79899 Other long term (current) drug therapy: Secondary | ICD-10-CM | POA: Diagnosis not present

## 2017-12-28 DIAGNOSIS — R112 Nausea with vomiting, unspecified: Secondary | ICD-10-CM | POA: Diagnosis not present

## 2017-12-28 DIAGNOSIS — N186 End stage renal disease: Secondary | ICD-10-CM | POA: Diagnosis not present

## 2017-12-28 DIAGNOSIS — Z66 Do not resuscitate: Secondary | ICD-10-CM | POA: Diagnosis not present

## 2017-12-28 LAB — CBC WITH DIFFERENTIAL/PLATELET
Abs Immature Granulocytes: 0 10*3/uL (ref 0.0–0.1)
Basophils Absolute: 0 10*3/uL (ref 0.0–0.1)
Basophils Relative: 0 %
EOS PCT: 0 %
Eosinophils Absolute: 0 10*3/uL (ref 0.0–0.7)
HCT: 38.4 % (ref 36.0–46.0)
HEMOGLOBIN: 11.5 g/dL — AB (ref 12.0–15.0)
IMMATURE GRANULOCYTES: 1 %
LYMPHS ABS: 0.9 10*3/uL (ref 0.7–4.0)
LYMPHS PCT: 20 %
MCH: 30.7 pg (ref 26.0–34.0)
MCHC: 29.9 g/dL — AB (ref 30.0–36.0)
MCV: 102.7 fL — ABNORMAL HIGH (ref 78.0–100.0)
Monocytes Absolute: 0.6 10*3/uL (ref 0.1–1.0)
Monocytes Relative: 13 %
NEUTROS PCT: 66 %
Neutro Abs: 3.1 10*3/uL (ref 1.7–7.7)
Platelets: 192 10*3/uL (ref 150–400)
RBC: 3.74 MIL/uL — AB (ref 3.87–5.11)
RDW: 17.8 % — AB (ref 11.5–15.5)
WBC: 4.7 10*3/uL (ref 4.0–10.5)

## 2017-12-28 LAB — COMPREHENSIVE METABOLIC PANEL
ALT: 10 U/L — ABNORMAL LOW (ref 14–54)
AST: 12 U/L — ABNORMAL LOW (ref 15–41)
Albumin: 3.1 g/dL — ABNORMAL LOW (ref 3.5–5.0)
Alkaline Phosphatase: 50 U/L (ref 38–126)
Anion gap: 12 (ref 5–15)
BUN: 17 mg/dL (ref 6–20)
CHLORIDE: 96 mmol/L — AB (ref 101–111)
CO2: 27 mmol/L (ref 22–32)
Calcium: 9.8 mg/dL (ref 8.9–10.3)
Creatinine, Ser: 5.95 mg/dL — ABNORMAL HIGH (ref 0.44–1.00)
GFR, EST AFRICAN AMERICAN: 7 mL/min — AB (ref >60)
GFR, EST NON AFRICAN AMERICAN: 6 mL/min — AB (ref >60)
Glucose, Bld: 83 mg/dL (ref 65–99)
Potassium: 3.8 mmol/L (ref 3.5–5.1)
Sodium: 135 mmol/L (ref 135–145)
Total Bilirubin: 0.8 mg/dL (ref 0.3–1.2)
Total Protein: 6.3 g/dL — ABNORMAL LOW (ref 6.5–8.1)

## 2017-12-28 MED ORDER — FAMOTIDINE 20 MG PO TABS
20.0000 mg | ORAL_TABLET | Freq: Every day | ORAL | Status: DC
  Administered 2017-12-29: 20 mg via ORAL
  Filled 2017-12-28: qty 1

## 2017-12-28 MED ORDER — SIMETHICONE 80 MG PO CHEW
80.0000 mg | CHEWABLE_TABLET | Freq: Three times a day (TID) | ORAL | Status: DC
  Administered 2017-12-28 – 2017-12-29 (×4): 80 mg via ORAL
  Filled 2017-12-28 (×4): qty 1

## 2017-12-28 MED ORDER — PRO-STAT SUGAR FREE PO LIQD
30.0000 mL | Freq: Two times a day (BID) | ORAL | Status: DC
  Administered 2017-12-28 – 2017-12-29 (×4): 30 mL via ORAL
  Filled 2017-12-28 (×4): qty 30

## 2017-12-28 NOTE — Progress Notes (Addendum)
Nancy [name] Progress Note   Subjective:  Seen in room. Looks improved. Eating breakfast without N/V at this time. No new symptoms. No CP/dyspnea.  Objective Vitals:   12/27/17 2046 12/27/17 2100 12/28/17 0428 12/28/17 0731  BP: (!) 156/78  (!) 177/81 (!) 180/81  Pulse: (!) 109  (!) 109 (!) 107  Resp: 16  18 18   Temp: 98.7 F (37.1 C)  98.3 F (36.8 C) 98.3 F (36.8 C)  TempSrc: Oral  Oral Oral  SpO2: 100%  98% 98%  Weight:  60.8 kg (134 lb 0.6 oz)    Height:       Physical Exam General: Frail appearing woman, NAD Heart: RRR; 2/6 SEM Lungs: CTAB Abdomen: soft, non-tender Extremities: No LE edema Dialysis Access: LUE AVF + thrill  Additional Objective Labs: Basic Metabolic Panel: Recent Labs  Lab 12/25/17 1600 12/26/17 0629 12/28/17 0436  NA 142 138 135  K 3.7 3.7 3.8  CL 95* 96* 96*  CO2 31 27 27   GLUCOSE 126* 102* 83  BUN 19 25* 17  CREATININE 6.53* 7.16* 5.95*  CALCIUM 10.5* 10.0 9.8   Liver Function Tests: Recent Labs  Lab 12/25/17 1600 12/26/17 0629 12/28/17 0436  AST 26 18 12*  ALT 13* 11* 10*  ALKPHOS 54 47 50  BILITOT 0.8 0.8 0.8  PROT 7.1 6.5 6.3*  ALBUMIN 3.6 3.2* 3.1*   CBC: Recent Labs  Lab 12/25/17 1600 12/26/17 0629 12/28/17 0436  WBC 6.3 7.3 4.7  NEUTROABS  --   --  3.1  HGB 11.7* 11.0* 11.5*  HCT 38.5 35.8* 38.4  MCV 102.7* 101.7* 102.7*  PLT 196 183 192   Studies/Results: Dg Abd Portable 1v  Result Date: 12/26/2017 CLINICAL DATA:  Nausea and vomiting EXAM: PORTABLE ABDOMEN - 1 VIEW COMPARISON:  12/16/2017, CT 10/24/2017, chest x-ray 12/16/2017 FINDINGS: Curvilinear calcifications over the mediastinum presumably represent aortic vascular calcification. Cardiomegaly. Few gas-filled but nonenlarged left mid abdominal small bowel loops with scattered colon gas and rectal gas present. No radiopaque calculi over the kidneys. Coarse calcification in the right pelvis presumably a fibroid. Surgical clips in the right  upper quadrant IMPRESSION: Overall nonobstructed gas pattern Electronically Signed   By: Donavan Foil M.D.   On: 12/26/2017 20:55   Medications:  . amLODipine  10 mg Oral Daily  . [START ON 12/29/2017] calcitRIOL  2 mcg Oral Q M,W,F-HD  . famotidine  20 mg Oral BID  . ferric citrate  210 mg Oral TID WC  . heparin injection (subcutaneous)  5,000 Units Subcutaneous Q8H   Dialysis Orders: [facility] MWF 4 hrs 180 NRe 300/800 57.5 kg 2.0 K/2.5 Ca LUA AVF -Heparin 3000 units IV TIW -Mircera 100 mcg IV q 2 weeks (last dose 12/24/2017) -Calcitriol 2.0 mcg PO TIW -Sensipar 60 mg PO TIW  Assessment/Plan: 1.Intractable N & V: Unclear cause, ?possibly med induced - sensipar on hold. Multiple admits for same issue, w/u negative. Clinically improved this morning.  Much better after stopping sensipar 2. ESRD: Continue HD per MWF schedule, next 6/24.  3. Anemia: Hgb 11.5, not due for ESA yet. 4. Secondary hyperparathyroidism : Corr Ca high, Phoslo d/c'd - continue Auryxia as binder only. Sensipar on hold. 5. HTN/volume: BP remains high, got down to 57.1kg s/p last HD. ?Consider ^ amlodipine again, although in past this has led to be bottoming out (was on clonidine prev as well). 6. Nutrition: Alb very low, adding pro-stat supps.  I have seen and examined this patient and agree  with the plan of care   [facility] W 12/28/2017, 1:43 PM  Veneta Penton, PA-C 12/28/2017, 9:52 AM  [name] Pager: [phone]

## 2017-12-28 NOTE — Progress Notes (Signed)
Subjective: Nancy [name] was seen resting in her bed this morning. Apparently was feeling well until about 30-minutes prior to our evaluation when she developed BL LQ abdominal pain. Reports its the same pain that she continues to present with. Notes addition of Pepcid yesterday helped "a little."   Objective:  Vital signs in last 24 hours: Vitals:   12/27/17 2046 12/27/17 2100 12/28/17 0428 12/28/17 0731  BP: (!) 156/78  (!) 177/81 (!) 180/81  Pulse: (!) 109  (!) 109 (!) 107  Resp: 16  18 18   Temp: 98.7 F (37.1 C)  98.3 F (36.8 C) 98.3 F (36.8 C)  TempSrc: Oral  Oral Oral  SpO2: 100%  98% 98%  Weight:  134 lb 0.6 oz (60.8 kg)    Height:       Physical Exam  Constitutional: She is oriented to person, place, and time. She appears well-developed and well-nourished. No distress.  HENT:  Head: Normocephalic and atraumatic.  Cardiovascular: Regular rhythm and intact distal pulses.  Tachycardia Systolic murmur  Pulmonary/Chest: Effort normal and breath sounds normal. No respiratory distress.  Abdominal: Soft. Bowel sounds are normal. She exhibits no distension. There is no tenderness.  Musculoskeletal: She exhibits no edema or deformity.  Neurological: She is alert and oriented to person, place, and time.  Skin: Skin is warm and dry.   Assessment/Plan: 77 yo F with Hx of ESRD on HD MWF, HTN, CVA and recent admission for intractable nausea and vomiting with HD who presented to clinic with recurrence of intractable nausea and vomiting. Patient with history of intractable nausea and vomiting associated with HD. Admitted 6/10-6/14 for this and improved with phenergan. She had mesenteric doppler performed showing patent celiac artery and SMA with IMA not visualized. Was discharge with phenergan to be taken prophylactically prior to HD, which worked well for her for about 1 week. However, at her session on 6/19, she felt that too much fluid was pulled off and she became nauseous about 2.5  hours in to her session so she stopped early. She states she started vomiting when she returned home with 5 episodes that evening and many episodes the following morning.   Intractable nausea and vomiting ?Transient Mesenteric Hypoperfusion ?Chronic Mesenteric Ischemia  She continues to experience intermittent nausea, vomiting and abdominal pain which seem to correlate with HD sessions. This has been evaluated over the past several months without clear etiology. Work-up included several imaging studies including CT abdomen/pelvis, KUBs and Mesenteric [place] which showed patent celiac and superior mesenteric arteries although IMA not visualized. It also appears that there has been concern for Chronic Mesenteric Ischemia for several years but pt did not follow-up with imaging. She is a vasculopath and while [place] did not show significant stenosis of the mesenteric arteries, she could have transient hypoperfusion related to HD. This remains at the top of my differential.  -We could consider mesenteric angiography or CTA for a more definitive diagnosis although she is ESRD but essentially anuric; appreciate nephrology recommendations -Most recent colonoscopy 10/2013 with sigmoid diverticulosis but otherwise normal. Last EGD 2008 but unable to see report  -Continue holding Sensipar, hold Phoslo -Phenergan q6h PRN (PO or IV) -Phenergan PRN Prior to HD sessions -Famotidine 20mg s  ESRD on HD MWF: -Nephrology following for HD -AM Renal Function Panel  HTN: BP remains elevated. On Clonidine 0.1mg  patch Weekly and Amlodipine 5mg  Daily at Home. Orthostatic on admission.  -Amlodipine to 10mg  -Continue PRN Hydral  FEN: Renal VTE ppx: Heparin Code Status:  DNR   Dispo: Anticipated discharge in approximately 1-2 day(s).   Nancy Zimny, DO 12/28/2017, 12:06 PM Pager: [phone]

## 2017-12-28 NOTE — Progress Notes (Signed)
Internal Medicine Attending:   I saw and examined the patient. I reviewed the resident's note and I agree with the resident's findings and plan as documented in the resident's note.  Still unable to tolerate PO today, one episode emesis last night. Will continue with supportive care today, holding all meds that may be causing this. If she still has nausea and vomiting tomorrow, I would pursue gastric emptying study to rule out gastroparesis and/or EGD to rule out gastritis/PUD.

## 2017-12-29 DIAGNOSIS — Z8673 Personal history of transient ischemic attack (TIA), and cerebral infarction without residual deficits: Secondary | ICD-10-CM | POA: Diagnosis not present

## 2017-12-29 DIAGNOSIS — Z66 Do not resuscitate: Secondary | ICD-10-CM | POA: Diagnosis not present

## 2017-12-29 DIAGNOSIS — N186 End stage renal disease: Secondary | ICD-10-CM | POA: Diagnosis not present

## 2017-12-29 DIAGNOSIS — Z885 Allergy status to narcotic agent status: Secondary | ICD-10-CM | POA: Diagnosis not present

## 2017-12-29 DIAGNOSIS — R011 Cardiac murmur, unspecified: Secondary | ICD-10-CM | POA: Diagnosis not present

## 2017-12-29 DIAGNOSIS — Z992 Dependence on renal dialysis: Secondary | ICD-10-CM | POA: Diagnosis not present

## 2017-12-29 DIAGNOSIS — I120 Hypertensive chronic kidney disease with stage 5 chronic kidney disease or end stage renal disease: Secondary | ICD-10-CM | POA: Diagnosis not present

## 2017-12-29 DIAGNOSIS — Z79899 Other long term (current) drug therapy: Secondary | ICD-10-CM | POA: Diagnosis not present

## 2017-12-29 DIAGNOSIS — R112 Nausea with vomiting, unspecified: Secondary | ICD-10-CM | POA: Diagnosis not present

## 2017-12-29 LAB — COMPREHENSIVE METABOLIC PANEL
ALBUMIN: 2.7 g/dL — AB (ref 3.5–5.0)
ALT: 8 U/L — AB (ref 14–54)
AST: 12 U/L — AB (ref 15–41)
Alkaline Phosphatase: 43 U/L (ref 38–126)
Anion gap: 13 (ref 5–15)
BUN: 40 mg/dL — ABNORMAL HIGH (ref 6–20)
CHLORIDE: 96 mmol/L — AB (ref 101–111)
CO2: 25 mmol/L (ref 22–32)
CREATININE: 8.39 mg/dL — AB (ref 0.44–1.00)
Calcium: 8.6 mg/dL — ABNORMAL LOW (ref 8.9–10.3)
GFR calc non Af Amer: 4 mL/min — ABNORMAL LOW (ref >60)
GFR, EST AFRICAN AMERICAN: 5 mL/min — AB (ref >60)
GLUCOSE: 108 mg/dL — AB (ref 65–99)
Potassium: 3.5 mmol/L (ref 3.5–5.1)
SODIUM: 134 mmol/L — AB (ref 135–145)
Total Bilirubin: 0.6 mg/dL (ref 0.3–1.2)
Total Protein: 5.1 g/dL — ABNORMAL LOW (ref 6.5–8.1)

## 2017-12-29 LAB — CBC
HCT: 32.2 % — ABNORMAL LOW (ref 36.0–46.0)
HCT: 35.3 % — ABNORMAL LOW (ref 36.0–46.0)
HEMOGLOBIN: 9.6 g/dL — AB (ref 12.0–15.0)
Hemoglobin: 10.6 g/dL — ABNORMAL LOW (ref 12.0–15.0)
MCH: 31.1 pg (ref 26.0–34.0)
MCH: 31.4 pg (ref 26.0–34.0)
MCHC: 29.8 g/dL — ABNORMAL LOW (ref 30.0–36.0)
MCHC: 30 g/dL (ref 30.0–36.0)
MCV: 104.2 fL — ABNORMAL HIGH (ref 78.0–100.0)
MCV: 104.4 fL — AB (ref 78.0–100.0)
PLATELETS: 128 10*3/uL — AB (ref 150–400)
Platelets: 170 10*3/uL (ref 150–400)
RBC: 3.09 MIL/uL — AB (ref 3.87–5.11)
RBC: 3.38 MIL/uL — AB (ref 3.87–5.11)
RDW: 17.2 % — ABNORMAL HIGH (ref 11.5–15.5)
RDW: 17.2 % — ABNORMAL HIGH (ref 11.5–15.5)
WBC: 3.9 10*3/uL — ABNORMAL LOW (ref 4.0–10.5)
WBC: 4.3 10*3/uL (ref 4.0–10.5)

## 2017-12-29 MED ORDER — AMLODIPINE BESYLATE 5 MG PO TABS: 5.0000 mg | ORAL_TABLET | Freq: Every day | ORAL | Status: DC

## 2017-12-29 MED ORDER — CALCITRIOL 0.5 MCG PO CAPS
ORAL_CAPSULE | ORAL | Status: AC
  Administered 2017-12-29: 2 ug via ORAL
  Filled 2017-12-29: qty 4

## 2017-12-29 NOTE — Progress Notes (Signed)
Subjective: Nancy [name] was seen sitting at the side of her bed eating breakfast this morning. She states she has been feeling better since yesterday. She denies any pain or nausea this morning. She tolerated her dinner well last night as well. She was told that if she does well in dialysis today, we will be discharging her. If her symptoms persist, she will be staying for some further workup. No further questions of concerns this morning.  Objective:  Vital signs in last 24 hours: Vitals:   12/28/17 0731 12/28/17 1628 12/28/17 2000 12/29/17 0242  BP: (!) 180/81 (!) 159/81 (!) 173/71 (!) 168/69  Pulse: (!) 107 (!) 106 (!) 102 (!) 102  Resp: 18 17 17 16   Temp: 98.3 F (36.8 C) 98.3 F (36.8 C) (!) 97.5 F (36.4 C) 98.4 F (36.9 C)  TempSrc: Oral Oral Oral Oral  SpO2: 98% 99% 99% 100%  Weight:   127 lb 3.3 oz (57.7 kg)   Height:       Physical Exam  Constitutional: She is oriented to person, place, and time. She appears well-developed and well-nourished. No distress.  HENT:  Head: Normocephalic and atraumatic.  Cardiovascular: Regular rhythm and intact distal pulses.  Tachycardia Systolic murmur  Pulmonary/Chest: Effort normal and breath sounds normal. No respiratory distress.  Abdominal: Soft. Bowel sounds are normal. She exhibits no distension. There is no tenderness.  Musculoskeletal: She exhibits no edema or deformity.  Neurological: She is alert and oriented to person, place, and time.  Skin: Skin is warm and dry.   Assessment/Plan: 77 yo F with Hx of ESRD on HD MWF, HTN, CVA and recent admission for intractable nausea and vomiting with HD who presented to clinic with recurrence of intractable nausea and vomiting. Patient with history of intractable nausea and vomiting associated with HD.  Admitted 6/10-6/14 for this and improved with phenergan and had mesenteric doppler performed showing patent celiac artery and SMA with IMA not visualized. Was discharged with phenergan to be  taken prophylactically prior to HD, which worked well for her for about 1 week. However, at her session on 6/19, she felt that too much fluid was pulled off and she became nauseous about 2.5 hours in to her session so she stopped early. She states she started vomiting when she returned home with 5 episodes that evening and many episodes the following morning.   Intractable nausea and vomiting: She continues to experience intermittent nausea, vomiting and abdominal pain which seem to correlate with HD sessions. This has been evaluated over the past several months without clear etiology. Work-up included several imaging studies including CT abdomen/pelvis, KUBs and Mesenteric [place] which showed patent celiac and superior mesenteric arteries although IMA not visualized. There has been concern for Chronic Mesenteric Ischemia in the past as well. > Last colonoscopy 10/2013 with sigmoid diverticulosis but otherwise normal. Last EGD 2008 but unable to see report  > Patient has improved some following discontinuation of Sensipar, though unclear at this time if this was the source of her symptoms > Though [place] did not show significant stenosis of the mesenteric arteries, she could have transient  hypoperfusion related to HD as she is a vasculopath. - Could consider mesenteric angiography or CTA for more definitive diagnosis, although she is ESRD but essentially anuric; will likely be pursued as an outpatient as she is feeling better today - Appreciate nephrology recommendations - Continue holding Sensipar, hold Phoslo - Phenergan q6h PRN (PO or IV) - Phenergan PRN Prior to HD  sessions - Famotidine 20mg s  ESRD on HD MWF: > Nephrology following for HD - AM Renal Function Panel  HTN: BP lower this AM at 675V Systolic. On Clonidine 0.1mg  patch Weekly and Amlodipine 5mg  Daily at Home. Orthostatic on admission.  - Discontinue clonidine patch - Amlodipine to 10mg  - Continue PRN Hydral  FEN: Renal VTE ppx:  Heparin Code Status: DNR   Dispo: Anticipated discharge in approximately 0-1 day(s).   Neva Seat, MD 12/29/2017, 7:22 AM Pager: [phone]

## 2017-12-29 NOTE — Progress Notes (Signed)
Internal Medicine Attending:   I saw and examined the patient. I reviewed the resident's note and I agree with the resident's findings and plan as documented in the resident's note.  Patient feels better this morning with no new complaints.  She states that she was able to eat most of her breakfast this morning.  No further episodes of nausea or vomiting today.  Patient was initially admitted for recurrent intractable nausea and vomiting usually related to her hemodialysis.  The etiology of her nausea and vomiting remains uncertain at this time.  She did have a mesenteric Doppler done which showed patent arteries.  If she does have persistent nausea and vomiting would consider obtaining a mesenteric angiogram to rule out mesenteric ischemia as a possible etiology.  Another possible etiology would be gastroparesis.  Would consider obtaining a gastric emptying study if this recurs.  If patient continues to improve and is tolerating oral diet would consider discharge home today.  If no improvement would obtain a mesenteric angiogram as an inpatient and if negative pursue a gastroparesis work-up.  There was also concerned that her nausea and vomiting may be related to her Sensipar.  This medication is now on hold.  Nephrology follow-up recommendations appreciated.  Continue with hemodialysis per nephrology.

## 2017-12-29 NOTE — Progress Notes (Addendum)
[facility] Progress Note   Subjective: Sitting up at bedside, eating breakfast. No C/O nausea today. Looks much better today. HD today on schedule.   Objective Vitals:   12/28/17 1628 12/28/17 2000 12/29/17 0242 12/29/17 0731  BP: (!) 159/81 (!) 173/71 (!) 168/69 123/63  Pulse: (!) 106 (!) 102 (!) 102 90  Resp: 17 17 16 18   Temp: 98.3 F (36.8 C) (!) 97.5 F (36.4 C) 98.4 F (36.9 C) 97.8 F (36.6 C)  TempSrc: Oral Oral Oral Oral  SpO2: 99% 99% 100% 100%  Weight:  57.7 kg (127 lb 3.3 oz)    Height:       Physical Exam General: Frail appearing female in NAD Heart: P7,T0 2/6 Systolic M. No JVD Lungs: CTAB A/P Abdomen: Active BS, non-tender Extremities: trace BLE edema Dialysis Access: LUA AVF + bruit   Additional Objective Labs: Basic Metabolic Panel: Recent Labs  Lab 12/25/17 1600 12/26/17 0629 12/28/17 0436  NA 142 138 135  K 3.7 3.7 3.8  CL 95* 96* 96*  CO2 31 27 27   GLUCOSE 126* 102* 83  BUN 19 25* 17  CREATININE 6.53* 7.16* 5.95*  CALCIUM 10.5* 10.0 9.8   Liver Function Tests: Recent Labs  Lab 12/25/17 1600 12/26/17 0629 12/28/17 0436  AST 26 18 12*  ALT 13* 11* 10*  ALKPHOS 54 47 50  BILITOT 0.8 0.8 0.8  PROT 7.1 6.5 6.3*  ALBUMIN 3.6 3.2* 3.1*   No results for input(s): LIPASE, AMYLASE in the last 168 hours. CBC: Recent Labs  Lab 12/25/17 1600 12/26/17 0629 12/28/17 0436  WBC 6.3 7.3 4.7  NEUTROABS  --   --  3.1  HGB 11.7* 11.0* 11.5*  HCT 38.5 35.8* 38.4  MCV 102.7* 101.7* 102.7*  PLT 196 183 192   Blood Culture    Component Value Date/Time   SDES BLOOD RIGHT WRIST 10/24/2017 0811   SPECREQUEST  10/24/2017 0811    BOTTLES DRAWN AEROBIC AND ANAEROBIC Blood Culture results may not be optimal due to an inadequate volume of blood received in culture bottles   CULT  10/24/2017 0811    NO GROWTH 5 DAYS Performed at [facility] [address] [place], [place] [address]    REPTSTATUS 10/29/2017 FINAL  10/24/2017 0811    Cardiac Enzymes: No results for input(s): CKTOTAL, CKMB, CKMBINDEX, TROPONINI in the last 168 hours. CBG: No results for input(s): GLUCAP in the last 168 hours. Iron Studies: No results for input(s): IRON, TIBC, TRANSFERRIN, FERRITIN in the last 72 hours. @lablastinr3 @ Studies/Results: No results found. Medications:  . amLODipine  10 mg Oral Daily  . calcitRIOL  2 mcg Oral Q M,W,F-HD  . famotidine  20 mg Oral Daily  . feeding supplement (PRO-STAT SUGAR FREE 64)  30 mL Oral BID  . ferric citrate  210 mg Oral TID WC  . heparin injection (subcutaneous)  5,000 Units Subcutaneous Q8H  . simethicone  80 mg Oral TID     Dialysis Orders: [facility] M[facility] 4 hrs 180 NRe 300/800 57.5 kg 2.0 K/2.5 Ca LUA AVF -Heparin 3000 units IV TIW -Mircera 100 mcg IV q 2 weeks (last dose 12/24/2017) -Calcitriol 2.0 mcg PO TIW -Sensipar 60 mg PO TIW  Assessment/Plan: 1.Intractable N & V:Unclear cause, ?possibly med induced - sensipar on hold. Multiple admits for same issue, w/u negative. Clinically improved this morning.  Much better after stopping sensipar 2. ESRD: Continue HD per MWF schedule, next HD today. Last K+ 3.8. Start on 3.0  K bath with HD today. If K+ >4.5 change to 2.0 K bath.   3. Anemia: Hgb 11.5, not due for ESA yet. 4. Secondary hyperparathyroidism: Corr Ca high, Phoslo d/c'd - continue Auryxia as binder only.Sensipar on hold. 5. HTN/volume: BP remains high, last wt 57.7 kg. Just above OP EDW. Trying lowering EDW to 57 kg-see if BP improves. If not will increase amlodipine to 10 mg pO Q HS.  6. Nutrition: Alb very low, adding pro-stat supps.   Nancy [name] H. Siler Mavis NP-C 12/29/2017, 8:39 AM  [employer] [phone]

## 2017-12-29 NOTE — Progress Notes (Signed)
Patients peripheral IV was malpositioned. RN advised that patient should have an IV while here at the hospital incase of any urgent matter, patient refused to have another one placed. MD on call made aware.   Ermalinda Memos, RN

## 2017-12-29 NOTE — Discharge Summary (Addendum)
Name: Nancy [name] MRN: [id number] DOB: Oct 01, 1940 77 y.o. PCP: Aldine Contes, MD  Date of Admission: 12/25/2017  4:30 PM Date of Discharge:  12/30/2017 Attending Physician: Aldine Contes, MD  Discharge Diagnosis:  1. Intractable nausea and vomiting 2. ESRD 3. HTN  Discharge Medications: Allergies as of 12/30/2017      Reactions   Hydrocodone Nausea And Vomiting, Other (See Comments)   dizziness      Medication List    STOP taking these medications   cloNIDine 0.1 mg/24hr patch Commonly known as:  CATAPRES - Dosed in mg/24 hr   SENSIPAR 60 MG tablet Generic drug:  cinacalcet     TAKE these medications   acetaminophen 500 MG tablet Commonly known as:  TYLENOL Take 2 tablets (1,000 mg total) by mouth every 8 (eight) hours as needed for mild pain, moderate pain, fever or headache.   amLODipine 5 MG tablet Commonly known as:  NORVASC Take 1 tablet (5 mg total) by mouth daily.   aspirin 81 MG EC tablet Take 1 tablet (81 mg total) by mouth daily.   calcitRIOL 0.5 MCG capsule Commonly known as:  ROCALTROL Take 4 capsules (2 mcg total) by mouth every Monday, Wednesday, and Friday with hemodialysis. What changed:    medication strength  how much to take  when to take this   calcium acetate 667 MG capsule Commonly known as:  PHOSLO Take 2 capsules (1,334 mg total) by mouth 3 (three) times daily with meals.   lidocaine-prilocaine cream Commonly known as:  EMLA Apply 1 application topically 3 (three) times a week. APPLY SMALL AMOUNT TO ACCESS SITE (AVF) 3 TIMES A WEEK 1 HOUR BEFORE DIALYSIS. COVER WITH OCCLUSIVE DRESSING (SARAN WRAP)   nitroGLYCERIN 0.3 MG SL tablet Commonly known as:  NITROSTAT Place 1 tablet (0.3 mg total) under the tongue every 5 (five) minutes as needed for chest pain.   polyethylene glycol packet Commonly known as:  MIRALAX / GLYCOLAX Take 17 g by mouth daily.   promethazine 12.5 MG tablet Commonly known as:  PHENERGAN Take  1 tablet (12.5 mg total) by mouth every 6 (six) hours as needed for nausea or vomiting (Take 1 prior to dialysis).   senna-docusate 8.6-50 MG tablet Commonly known as:  Senokot-S Take 1 tablet by mouth at bedtime as needed for mild constipation or moderate constipation.       Disposition and follow-up:   Nancy [name] was discharged from [facility] in Stable condition.  At the hospital follow up visit please address:  Intractable nausea and vomiting: - Evaluate for recurence of symptoms - Ensure adherence with phenergan - CTA for mesenteric ischemia rule out (she is ESRD and makes very little urine) -- If this is negative, can consider gastric emptying study - Sensipar d/c'd by Nephro as potential contributor  HTN: - Continue Amlodipine 5mg  Daily, ensure adherence - Clonidine patch discontinued  2.  Labs / imaging needed at time of follow-up: CTA messentary  3.  Pending labs/ test needing follow-up: None  Follow-up Appointments:   Hospital Course by problem list:  Intractable nausea and vomiting: Patient presented from clinic with recurrence of intractable nausea and vomiting. Patient with history of intractable nausea and vomiting associated with HD. This has been evaluated over the past several months without clear etiology. Work-up included several imaging studies including CT abdomen/pelvis, KUBs and Mesenteric [place] which showed patent celiac and superior mesenteric arteries although IMA not visualized. There has been concern for Chronic  Mesenteric Ischemia in the past as well. Patient did improve some following discontinuation of Sensipar, though unclear at this time if this was the source of her symptoms as she had been inpatient several days by the time of improvement. Further workup for mesenteric ischemia should inglude CTA for more definitive diagnosis, although she is ESRD (she is essentially anuric). Emptying study for gastroparesis is another  consideration if medication causes and mesenteric workup negative. Sensipar held at discharge.  HTN: Patient was initially hypertensive to 244W-102V systolic. Patient has been on Clonidine 0.1mg  patch Weekly and Amlodipine 5mg  Daily at Home. Clonidine was to be discontinued after prior admission. Hypertension likely related to cutting dialysis short prior to admission. BP improved while admitted and home Amlodipine conitnued and Clonidine discontinued at discharge.  Discharge Vitals:   BP 134/68 (BP Location: Right Arm)   Pulse 90   Temp 98.2 F (36.8 C) (Oral)   Resp 18   Ht 5\' 3"  (1.6 m)   Wt 126 lb (57.2 kg)   SpO2 99%   BMI 22.32 kg/m   Pertinent Labs, Studies, and Procedures:  CBC Latest Ref Rng & Units 12/29/2017 12/29/2017 12/28/2017  WBC 4.0 - 10.5 K/uL 4.3 3.9(L) 4.7  Hemoglobin 12.0 - 15.0 g/dL 10.6(L) 9.6(L) 11.5(L)  Hematocrit 36.0 - 46.0 % 35.3(L) 32.2(L) 38.4  Platelets 150 - 400 K/uL 128(L) 170 192   CMP Latest Ref Rng & Units 12/29/2017 12/28/2017 12/26/2017  Glucose 65 - 99 mg/dL 108(H) 83 102(H)  BUN 6 - 20 mg/dL 40(H) 17 25(H)  Creatinine 0.44 - 1.00 mg/dL 8.39(H) 5.95(H) 7.16(H)  Sodium 135 - 145 mmol/L 134(L) 135 138  Potassium 3.5 - 5.1 mmol/L 3.5 3.8 3.7  Chloride 101 - 111 mmol/L 96(L) 96(L) 96(L)  CO2 22 - 32 mmol/L 25 27 27   Calcium 8.9 - 10.3 mg/dL 8.6(L) 9.8 10.0  Total Protein 6.5 - 8.1 g/dL 5.1(L) 6.3(L) 6.5  Total Bilirubin 0.3 - 1.2 mg/dL 0.6 0.8 0.8  Alkaline Phos 38 - 126 U/L 43 50 47  AST 15 - 41 U/L 12(L) 12(L) 18  ALT 14 - 54 U/L 8(L) 10(L) 11(L)   Portable Abd Xray: IMPRESSION: Overall nonobstructed gas pattern  Discharge Instructions: Discharge Instructions    Call MD for:  difficulty breathing, headache or visual disturbances   Complete by:  As directed    Call MD for:  persistant dizziness or light-headedness   Complete by:  As directed    Call MD for:  persistant nausea and vomiting   Complete by:  As directed    Call MD for:   temperature >100.4   Complete by:  As directed    Diet - low sodium heart healthy   Complete by:  As directed    Discharge instructions   Complete by:  As directed    Thank you for allowing [place] to care for you  The source of your symptoms remains unclear. You did improve as Sensipar (Dailaysis medication) was stopped. Your symptoms may also be due to poor blood flow to your GI system. - Continue to take prophylactic phenergan before HD - You will need to have a scan that looks at your blood flow arranged through the clinic  For your blood pressure: - Continue amlodipine 5mg  daily - Your clonidine has been discontinue, please do not use the patch, this will be reevaluated at dialysis and in the clinic  Please follow up in the clinic in the next week   Increase activity slowly  Complete by:  As directed       Signed: Neva Seat, MD 12/30/2017, 9:32 AM   Pager: [phone]

## 2017-12-30 ENCOUNTER — Other Ambulatory Visit: Payer: Self-pay | Admitting: *Deleted

## 2017-12-30 ENCOUNTER — Other Ambulatory Visit: Payer: Self-pay | Admitting: Pharmacist

## 2017-12-30 ENCOUNTER — Telehealth: Payer: Self-pay | Admitting: Internal Medicine

## 2017-12-30 DIAGNOSIS — N186 End stage renal disease: Secondary | ICD-10-CM | POA: Diagnosis not present

## 2017-12-30 DIAGNOSIS — Z66 Do not resuscitate: Secondary | ICD-10-CM | POA: Diagnosis not present

## 2017-12-30 DIAGNOSIS — R011 Cardiac murmur, unspecified: Secondary | ICD-10-CM | POA: Diagnosis not present

## 2017-12-30 DIAGNOSIS — Z885 Allergy status to narcotic agent status: Secondary | ICD-10-CM | POA: Diagnosis not present

## 2017-12-30 DIAGNOSIS — R112 Nausea with vomiting, unspecified: Secondary | ICD-10-CM | POA: Diagnosis not present

## 2017-12-30 DIAGNOSIS — Z79899 Other long term (current) drug therapy: Secondary | ICD-10-CM | POA: Diagnosis not present

## 2017-12-30 DIAGNOSIS — Z8673 Personal history of transient ischemic attack (TIA), and cerebral infarction without residual deficits: Secondary | ICD-10-CM | POA: Diagnosis not present

## 2017-12-30 DIAGNOSIS — I120 Hypertensive chronic kidney disease with stage 5 chronic kidney disease or end stage renal disease: Secondary | ICD-10-CM | POA: Diagnosis not present

## 2017-12-30 DIAGNOSIS — Z992 Dependence on renal dialysis: Secondary | ICD-10-CM | POA: Diagnosis not present

## 2017-12-30 MED ORDER — CALCITRIOL 0.5 MCG PO CAPS: 2.0000 ug | ORAL_CAPSULE | ORAL | Status: AC

## 2017-12-30 NOTE — Progress Notes (Signed)
Subjective: [name] was seen resting her bed this morning. She states she has continued to feel well since yesterday and tolerated her dialysis without incident. She denies any nausea or abdominal pain and was able to tolerate her meals well. She was informed she would be discharged today and that she should stop using the clonidine patch until told otherwise. She states she feels ready to go home and has no further questions.  Objective:  Vital signs in last 24 hours: Vitals:   12/29/17 1830 12/29/17 2049 12/29/17 2053 12/30/17 0451  BP: (!) 118/56  109/60 134/68  Pulse: 93  99 90  Resp: 18  18 18   Temp: 98 F (36.7 C)  98.1 F (36.7 C) 98.2 F (36.8 C)  TempSrc: Oral  Oral Oral  SpO2: 100%  99% 99%  Weight:  126 lb (57.2 kg)    Height:       Physical Exam  Constitutional: She is oriented to person, place, and time. She appears well-developed and well-nourished. No distress.  HENT:  Head: Normocephalic and atraumatic.  Cardiovascular: Normal rate, regular rhythm and intact distal pulses.  Systolic murmur  Pulmonary/Chest: Effort normal and breath sounds normal. No respiratory distress.  Abdominal: Soft. Bowel sounds are normal. She exhibits no distension. There is no tenderness.  Musculoskeletal: She exhibits no edema or deformity.  Neurological: She is alert and oriented to person, place, and time.  Skin: Skin is warm and dry.   Assessment/Plan: 77 yo F with Hx of ESRD on HD MWF, HTN, CVA and recent admission for intractable nausea and vomiting with HD who presented to clinic with recurrence of intractable nausea and vomiting. Patient with history of intractable nausea and vomiting associated with HD.  Admitted 6/10-6/14 for this and improved with phenergan and had mesenteric doppler performed showing patent celiac artery and SMA with IMA not visualized. Was discharged with phenergan to be taken prophylactically prior to HD, which worked well for her for about 1 week. However,  at her session on 6/19, she felt that too much fluid was pulled off and she became nauseous about 2.5 hours in to her session so she stopped early. She states she started vomiting when she returned home with 5 episodes that evening and many episodes the following morning.   Intractable nausea and vomiting: Intermittent nausea, vomiting and abdominal pain, which seem to correlate with HD sessions has been resolved since yesterday. This has been evaluated over the past several months without clear etiology. Work-up included several imaging studies including CT abdomen/pelvis, KUBs and Mesenteric [place] which showed patent celiac and superior mesenteric arteries although IMA not visualized. There has been concern for Chronic Mesenteric Ischemia in the past as well. > Last colonoscopy 10/2013 with sigmoid diverticulosis but otherwise normal. Last EGD 2008 but unable to see report > Patient has improved some following discontinuation of Sensipar, though unclear at this time if this was the source of her symptoms > Though [place] did not show significant stenosis of the mesenteric arteries, she could have transient  hypoperfusion related to HD as she is a vasculopath. > Tolerated Dialysis well yesterday - Mesenteric angiography or CTA for more definitive diagnosis, although she is ESRD but essentially anuric; will likely be pursued as an outpatient as she is feeling better today. - Appreciate nephrology recommendations - Continue holding Sensipar, hold Phoslo - Phenergan q6h PRN (PO or IV) - Phenergan PRN Prior to HD sessions - Famotidine 20mg s  ESRD on HD MWF: > Nephrology following  for HD  HTN: BP 810F Systolic. On Clonidine 0.1mg  patch Weekly and Amlodipine 5mg  Daily at Home. - Amlodipine to 10mg  - Continue PRN Hydral  FEN: Renal VTE ppx: Heparin Code Status: DNR   Dispo: Anticipated discharge in approximately 0-1 day(s).   Neva Seat, MD 12/30/2017, 9:03 AM Pager: [phone]

## 2017-12-30 NOTE — Patient Outreach (Signed)
[facility]) Care Management  [place]   12/30/2017  Nancy [name] [birth year] [id number]  Subjective: Patient was called for medication review post discharge and for a referral for medication assistance. HIPAA identifiers were obtained.  Patient is a 77 year old female with multiple medical conditions including but not limited to: ESRD on HD MWF, HTN, CVA, anemia of chronic disease, aortic stenosis, GERD, and osteoarthritis.  She was recently admitted for intractable nausea and vomiting   Patient reported that she manages her own medications with the assistance of her daughter and granddaughter.  Patient reported that she did not have Calcium Acetate 667mg  post discharge.  She was unsure of drug pricing being an issue.  Objective:   Encounter Medications: Outpatient Encounter Medications as of 12/30/2017  Medication Sig Note  . acetaminophen (TYLENOL) 500 MG tablet Take 2 tablets (1,000 mg total) by mouth every 8 (eight) hours as needed for mild pain, moderate pain, fever or headache.   Marland Kitchen amLODipine (NORVASC) 5 MG tablet Take 1 tablet (5 mg total) by mouth daily.   . calcitRIOL (ROCALTROL) 0.5 MCG capsule Take 4 capsules (2 mcg total) by mouth every Monday, Wednesday, and Friday with hemodialysis.   Marland Kitchen lidocaine-prilocaine (EMLA) cream Apply 1 application topically 3 (three) times a week. APPLY SMALL AMOUNT TO ACCESS SITE (AVF) 3 TIMES A WEEK 1 HOUR BEFORE DIALYSIS. COVER WITH OCCLUSIVE DRESSING (SARAN WRAP)   . nitroGLYCERIN (NITROSTAT) 0.3 MG SL tablet Place 1 tablet (0.3 mg total) under the tongue every 5 (five) minutes as needed for chest pain.   . promethazine (PHENERGAN) 12.5 MG tablet Take 1 tablet (12.5 mg total) by mouth every 6 (six) hours as needed for nausea or vomiting (Take 1 prior to dialysis).   Marland Kitchen aspirin EC 81 MG EC tablet Take 1 tablet (81 mg total) by mouth daily. (Patient not taking: Reported on 12/30/2017)   . calcium acetate (PHOSLO) 667 MG  capsule Take 2 capsules (1,334 mg total) by mouth 3 (three) times daily with meals. (Patient not taking: Reported on 12/30/2017) 12/28/2017: Has not taken in over a month  . polyethylene glycol (MIRALAX / GLYCOLAX) packet Take 17 g by mouth daily. (Patient not taking: Reported on 11/27/2017)   . senna-docusate (SENOKOT-S) 8.6-50 MG tablet Take 1 tablet by mouth at bedtime as needed for mild constipation or moderate constipation. (Patient not taking: Reported on 12/30/2017)    No facility-administered encounter medications on file as of 12/30/2017.     Functional Status: In your present state of health, do you have any difficulty performing the following activities: 12/25/2017 12/25/2017  Hearing? - N  Vision? - N  Difficulty concentrating or making decisions? - N  Walking or climbing stairs? - Y  Comment - -  Dressing or bathing? - N  Doing errands, shopping? Y -  Conservation officer, nature and eating ? - -  Using the Toilet? - -  In the past six months, have you accidently leaked urine? - -  Do you have problems with loss of bowel control? - -  Managing your Medications? - -  Managing your Finances? - -  Housekeeping or managing your Housekeeping? - -  Some recent data might be hidden    Fall/Depression Screening: Fall Risk  12/25/2017 10/30/2017 07/31/2017  Falls in the past year? Yes Yes Yes  Number falls in past yr: 1 1 1   Injury with Fall? No No Yes  Risk Factor Category  - - High Fall Risk  Risk for fall due to : - - History of fall(s)  Risk for fall due to: Comment - - -  Follow up - - Falls prevention discussed   PHQ 2/9 Scores 12/25/2017 12/24/2017 10/30/2017 08/07/2017 07/31/2017 07/31/2017 07/22/2017  PHQ - 2 Score 0 0 0 0 0 0 0      Assessment:  ASSESSMENT: Date Discharged from Hospital: 12/30/2017 Date Medication Reconciliation Performed: 12/30/2017  Medications Discontinued at Discharge: (Delete if not applicable)  Clonidine 0.1 patch (taper)  Sensipar  Patient was recently  discharged from hospital and all medications have been reviewed   Drugs sorted by system:  Neurologic/Psychologic:   Cardiovascular: Amlodipine Nitroglycerin Aspirin -patient said she is not taking this because she does not have any  Pulmonary/Allergy:  Gastrointestinal: Polyethylene glycol-patient said she does not take this Promethazine (PRN) Senna-Docusate-patient said she does not take this  Renal: Calcitriol Calcium Acetate 667 (patient reported she does not have this medication in her possession and has not taken it in more than a month because her pharmacy ([facility]) went out of business.   Topical: Lidocaine-prilocaine cream (used at Dialysis center)  Pain: Acetaminophen  Medication Review Findings;  Adherence- Aspirin-patient reported she is not taking aspirin because she does not have any.  Will speak with the patient's daughter about purchasing and investigate OTC resources with the patient's insurance.  Calcium Acetate-patient reported she does not have any calcium acetate tablets and has not taken any for more than a month.  The dialysis center was called.  The nurse at the dialysis center said Calcium Acetate and Auryxia were both on the patient's medication list.  However, after further investigation, the nurse said the patient should be on [name].  Auryxia-was discontinued during the patient's inpatient stay on 12/20/17.  The discharge summary listed Calcium Carbonate.  As such, patient is not currently taking any binders.    Patient's daughter and granddaughter were called. Both were unaware of the medication changes at discharge. Patient will be going to dialysis tomorrow.  PLAN: Contact the dialysis center to do a medication review. Have the dialysis center print a medication list off for the patient to give to her daughter.  Follow up with the patient and her family after dialysis.   Elayne Guerin, PharmD, [facility]  Pharmacist [phone]

## 2017-12-30 NOTE — Telephone Encounter (Signed)
Hospital f/u per Dr Danford Bad; pt appt 01/06/2018 1015am/NW

## 2017-12-30 NOTE — Consult Note (Signed)
   [facility] CM Inpatient Consult   12/30/2017  Nancy [name] 04/30/1941 [id number]  Went by to speak with patient but she had already transitioned home. [facility] telephonic and Pharmacist to continue to follow. Her primary care provider's office is listed to follow her transition of care follow up calls.  [name], RN BSN [facility] Liaison  [phone] business mobile phone Toll free office [phone]

## 2017-12-30 NOTE — Progress Notes (Signed)
Internal Medicine Attending:   I saw and examined the patient. I reviewed the resident's note and I agree with the resident's findings and plan as documented in the resident's note.  Patient feels well today with no new complaints.  She denies any nausea or abdominal pain was able to tolerate her her meals well.  She tolerated hemodialysis well yesterday as well.  Patient initially admitted with intractable nausea and vomiting associated with her hemodialysis.  No further work-up at this time.  Patient is stable for discharge home today.  Given her recurrent episodes of nausea and vomiting and abdominal pain related to her hemodialysis I am concerned for mesenteric ischemia.  She did have a mesenteric Doppler done which showed patent celiac and superior mesenteric arteries although the IMA was not visualized.  I would recommend obtaining a CTA as an outpatient to further evaluate for mesenteric ischemia.  If this is negative would consider a gastric emptying study if her symptoms recur.

## 2017-12-30 NOTE — Progress Notes (Addendum)
  [facility] Progress Note   Subjective:  Seen in room. walking around. No abd pain, N/V. Tells me being discharged today.  Objective Vitals:   12/29/17 2049 12/29/17 2053 12/30/17 0451 12/30/17 1008  BP:  109/60 134/68 (!) 132/51  Pulse:  99 90 98  Resp:  18 18 18   Temp:  98.1 F (36.7 C) 98.2 F (36.8 C) 98.7 F (37.1 C)  TempSrc:  Oral Oral Oral  SpO2:  99% 99% 99%  Weight: 57.2 kg (126 lb)     Height:       Physical Exam General: Frail woman, NAD Heart: RRR, 2/6 murmur Lungs: CTAB Extremities: No LE edema Dialysis Access:  L AVF + thrill  Additional Objective Labs: Basic Metabolic Panel: Recent Labs  Lab 12/26/17 0629 12/28/17 0436 12/29/17 1430  NA 138 135 134*  K 3.7 3.8 3.5  CL 96* 96* 96*  CO2 27 27 25   GLUCOSE 102* 83 108*  BUN 25* 17 40*  CREATININE 7.16* 5.95* 8.39*  CALCIUM 10.0 9.8 8.6*   Liver Function Tests: Recent Labs  Lab 12/26/17 0629 12/28/17 0436 12/29/17 1430  AST 18 12* 12*  ALT 11* 10* 8*  ALKPHOS 47 50 43  BILITOT 0.8 0.8 0.6  PROT 6.5 6.3* 5.1*  ALBUMIN 3.2* 3.1* 2.7*   CBC: Recent Labs  Lab 12/25/17 1600 12/26/17 0629 12/28/17 0436 12/29/17 1430 12/29/17 1841  WBC 6.3 7.3 4.7 3.9* 4.3  NEUTROABS  --   --  3.1  --   --   HGB 11.7* 11.0* 11.5* 9.6* 10.6*  HCT 38.5 35.8* 38.4 32.2* 35.3*  MCV 102.7* 101.7* 102.7* 104.2* 104.4*  PLT 196 183 192 170 128*   BMedications:  . amLODipine  5 mg Oral Daily  . calcitRIOL  2 mcg Oral Q M,W,F-HD  . famotidine  20 mg Oral Daily  . feeding supplement (PRO-STAT SUGAR FREE 64)  30 mL Oral BID  . ferric citrate  210 mg Oral TID WC  . heparin injection (subcutaneous)  5,000 Units Subcutaneous Q8H  . simethicone  80 mg Oral TID    Dialysis Orders: [facility] MWF 4 hrs 180 NRe 300/800 57.5 kg 2.0 K/2.5 Ca LUA AVF -Heparin 3000 units IV TIW -Mircera 100 mcg IV q 2 weeks (last dose 12/24/2017) -Calcitriol 2.0 mcg PO TIW -Sensipar 60 mg PO  TIW  Assessment/Plan: 1.Intractable N & V:Unclear cause, ?possibly med induced - sensipar on hold. Multiple admits for same issue, w/u negative. Clinically improved this morning.Plan is for further outpatient investigation to r/o mesenteric ischemia. 2. ESRD: Continue HD per MWF schedule. 3. Anemia: Hgb 10.6, not due for ESA yet. 4. Secondary hyperparathyroidism: Corr Ca high,Phoslo d/c'd- continue Auryxia as binder only.Sensipar on hold. 5. HTN/volume: BP remains high, last wt 57.7 kg. Just above OP EDW. Trying lowering EDW to 57 kg-see if BP improves. If not will increase amlodipine to 10 mg pO Q HS.  6. Nutrition:Alb very low, adding pro-stat supps.   Veneta Penton, PA-C 12/30/2017, 11:06 AM  [facility] Pager: [phone]  Pt seen, examined and agree w A/P as above.  Kelly Splinter MD [employer] pager [phone]   12/30/2017, 11:28 AM

## 2017-12-30 NOTE — Patient Outreach (Signed)
  [facility]) Care Management  12/30/2017  [place] B Stahl 1941/04/10 [id number]   Transition of Care Referral   Referral Date:  12/30/17  Referral Source: [facility] liaison Date of Admission: 12/25/17 Diagnosis: observation for intractable n/v  Date of Discharge: 12/29/2017 Facility:  Insurance: united health care medicare   Outreach attempt #1  No answer at her listed mobile number and unable to leave a voice message No answer at her listed home number and Cm unable to leave a voice message as her voice mail box is not set up    Plan: [facility] RN CM sent an unsuccessful outreach letter  Sent assigned [facility] in basket message Consulted with [facility] pharmacy staff - concerns about possible  Medication discrepancy  Fronie Holstein L. Lavina Hamman, RN, BSN, CCM [facility] Telephonic Care Management Care Coordinator Direct number [phone]  [facility] number [phone] Fax number [phone]

## 2017-12-30 NOTE — Progress Notes (Signed)
Patient discharged to home with daughter. After visit Summary reviewed. Patient capable of reverbalizing medications and follow up visits. No signs and symptoms of distress noted. Patient educated to return to the ED in the case of an emergency. Dillon Bjork RN

## 2017-12-31 ENCOUNTER — Other Ambulatory Visit: Payer: Self-pay | Admitting: Pharmacist

## 2017-12-31 DIAGNOSIS — D689 Coagulation defect, unspecified: Secondary | ICD-10-CM | POA: Diagnosis not present

## 2017-12-31 DIAGNOSIS — N186 End stage renal disease: Secondary | ICD-10-CM | POA: Diagnosis not present

## 2017-12-31 DIAGNOSIS — N2581 Secondary hyperparathyroidism of renal origin: Secondary | ICD-10-CM | POA: Diagnosis not present

## 2017-12-31 DIAGNOSIS — R51 Headache: Secondary | ICD-10-CM | POA: Diagnosis not present

## 2017-12-31 DIAGNOSIS — D631 Anemia in chronic kidney disease: Secondary | ICD-10-CM | POA: Diagnosis not present

## 2017-12-31 NOTE — Patient Outreach (Signed)
[facility]) Care Management  12/31/2017  Nancy [name] Nov 04, 1940 [id number]   Spoke with the patient's Dialysis Center Nurse and reviewed the patients medications.  Per dialysis she should be on:  Medications Reviewed Today    Reviewed by Elayne Guerin, [facility] (Pharmacist) on 12/30/17 at 1609  Med List Status: <None>  Medication Order Taking? Sig Documenting Provider Last Dose Status Informant  acetaminophen (TYLENOL) 500 MG tablet [id number] Yes Take 2 tablets (1,000 mg total) by mouth every 8 (eight) hours as needed for mild pain, moderate pain, fever or headache. Colbert Ewing, MD Taking Active Self  amLODipine (NORVASC) 5 MG tablet [id number] Yes Take 1 tablet (5 mg total) by mouth daily. Zada Finders, MD Taking Active Self  aspirin EC 81 MG EC tablet [id number] No Take 1 tablet (81 mg total) by mouth daily.  Patient not taking:  Reported on 12/30/2017   Valinda Party, DO Not Taking Active Self           Med Note Duffy Bruce, Para [place] Oct 30, 2017 10:32 PM)    calcitRIOL (ROCALTROL) 0.5 MCG capsule [id number] Yes Take 4 capsules (2 mcg total) by mouth every Monday, Wednesday, and Friday with hemodialysis. Neva Seat, MD Taking Active   calcium acetate [facility]) 667 MG capsule [id number] No Take 2 capsules (1,334 mg total) by mouth 3 (three) times daily with meals.  Patient not taking:  Reported on 12/30/2017   Melanee Spry, MD Not Taking Active Self           Med Note Lonna Cobb   Sun Dec 28, 2017  9:26 AM) Has not taken in over a month  lidocaine-prilocaine (EMLA) cream [id number] Yes Apply 1 application topically 3 (three) times a week. APPLY SMALL AMOUNT TO ACCESS SITE (AVF) 3 TIMES A WEEK 1 HOUR BEFORE DIALYSIS. COVER WITH OCCLUSIVE DRESSING [facility] WRAP) [provider] Taking Active Self  nitroGLYCERIN (NITROSTAT) 0.3 MG SL tablet [id number] Yes Place 1 tablet (0.3 mg total) under the tongue every 5 (five) minutes as needed for  chest pain. Sid Falcon, MD Taking Active Self           Med Note Valli Glance Dec 30, 2017  3:24 PM)    polyethylene glycol [facility] / GLYCOLAX) packet [id number] No Take 17 g by mouth daily.  Patient not taking:  Reported on 11/27/2017   Katherine Roan, MD Not Taking Active Self           Med Note Nash Mantis, TIFFANI S   Sat Nov 01, 2017  2:49 PM)    promethazine (PHENERGAN) 12.5 MG tablet [id number] Yes Take 1 tablet (12.5 mg total) by mouth every 6 (six) hours as needed for nausea or vomiting (Take 1 prior to dialysis). Neva Seat, MD Taking Active Self  senna-docusate (SENOKOT-S) 8.6-50 MG tablet [id number] No Take 1 tablet by mouth at bedtime as needed for mild constipation or moderate constipation.  Patient not taking:  Reported on 12/30/2017   Neva Seat, MD Not Taking Active Self  Med List Note Burnett Harry, CPhT 12/26/17 1316): Dialysis M W F          The Dialysis nurse was asked to send an active medication list home with the patient.  The Dialysis center was made aware the patient was not currently taking [name].  The nurse said she would alert the nutritionist.  Spoke with the patient's granddaughter today.  Home visit scheduled for  tomorrow 01/01/18 at 11:00am.   Elayne Guerin, PharmD, [place] Clinical Pharmacist [phone]

## 2018-01-01 ENCOUNTER — Telehealth: Payer: Self-pay | Admitting: Internal Medicine

## 2018-01-01 ENCOUNTER — Other Ambulatory Visit: Payer: Self-pay

## 2018-01-01 ENCOUNTER — Other Ambulatory Visit: Payer: Self-pay | Admitting: Pharmacist

## 2018-01-01 DIAGNOSIS — I350 Nonrheumatic aortic (valve) stenosis: Secondary | ICD-10-CM | POA: Diagnosis not present

## 2018-01-01 DIAGNOSIS — Z992 Dependence on renal dialysis: Secondary | ICD-10-CM | POA: Diagnosis not present

## 2018-01-01 DIAGNOSIS — N186 End stage renal disease: Secondary | ICD-10-CM | POA: Diagnosis not present

## 2018-01-01 DIAGNOSIS — I509 Heart failure, unspecified: Secondary | ICD-10-CM | POA: Diagnosis not present

## 2018-01-01 DIAGNOSIS — Z8673 Personal history of transient ischemic attack (TIA), and cerebral infarction without residual deficits: Secondary | ICD-10-CM | POA: Diagnosis not present

## 2018-01-01 DIAGNOSIS — I132 Hypertensive heart and chronic kidney disease with heart failure and with stage 5 chronic kidney disease, or end stage renal disease: Secondary | ICD-10-CM | POA: Diagnosis not present

## 2018-01-01 DIAGNOSIS — G4700 Insomnia, unspecified: Secondary | ICD-10-CM | POA: Diagnosis not present

## 2018-01-01 DIAGNOSIS — K5792 Diverticulitis of intestine, part unspecified, without perforation or abscess without bleeding: Secondary | ICD-10-CM | POA: Diagnosis not present

## 2018-01-01 NOTE — Patient Outreach (Signed)
[place] [facility]) Care Management  01/01/2018  [place] B Rosinski 06-23-1941 [id number]  Recent hospitalization 6/20-6/25 with intractable nausea vomiting.  77 year old with history of ESRD/HD  RNCM called to follow up. Client states, "I ain't doing too good today". She states "I am dizzy. I am afraid I am going to fall". She states she is home alone. She reports her granddaughter is aware of her complaint of dizziness and called her doctor, but she is not sure what the response from her doctor is. Client states her dizziness is not getting any better. She states her dialysis day is tomorrow. She denies nausea or vomiting at this time. She denies pain. She states she is not able to check her own blood pressure.  RNCM called [name], client's granddaughter with client's permission. [name] states that she was unable to get to her phone in time to received follow up call from provider office and did not confirm that she has received the message. RNCM relayed message from provider office for concern about dizziness go to the Emergency Department. [name] states that her grandmother continues to wear the clonidine patch and will not take it off.  RNCM reinforce provider office recommendation to go to the emergency room as client states dizziness is not getting better with concern of falling. Daughter arrived at client's home just as [facility] was ending call.  Plan: continue to follow.  Thea Silversmith, RN, MSN, [place] Coordinator Cell: [phone]

## 2018-01-01 NOTE — Telephone Encounter (Signed)
Attempted to reach granddaughter. No answer. As clinic phones are now turned off left message with instructions to take patient to ED if concerned about dizziness. Left appt reminder for 01/06/2018 at 1015. Hubbard Hartshorn, RN, BSN

## 2018-01-01 NOTE — Telephone Encounter (Signed)
Patient grand daughter was told if patient feels to call in, pt is feeling dizzy. Pt was just release from the hospital   Pls call patient or granddaughter

## 2018-01-01 NOTE — Patient Outreach (Addendum)
[facility]) Care Management  [place]   01/01/2018  [place] B Brammer 1940-12-01 [id number]  Subjective: Patient and her granddaughter were called via speaker phone to review her medications.  HIPAA identifiers were obtained.  Patient is a 77 year old female with multiple medical conditions including but not limited to: ESRD on HD MWF, HTN, CVA, anemia of chronic disease, aortic stenosis, GERD, and osteoarthritis. She was recently admitted for intractable nausea and vomiting.  Patient's granddaughter [name]) helps her with her medications.  Tequila reported she picked up her grandmother's Aspirin so she has it now.    Objective:   Encounter Medications: Outpatient Encounter Medications as of 01/01/2018  Medication Sig Note  . acetaminophen (TYLENOL) 500 MG tablet Take 2 tablets (1,000 mg total) by mouth every 8 (eight) hours as needed for mild pain, moderate pain, fever or headache.   Marland Kitchen amLODipine (NORVASC) 5 MG tablet Take 1 tablet (5 mg total) by mouth daily. 01/01/2018: Takes on dialysis days   . aspirin EC 81 MG EC tablet Take 1 tablet (81 mg total) by mouth daily.   . calcitRIOL (ROCALTROL) 0.5 MCG capsule Take 4 capsules (2 mcg total) by mouth every Monday, Wednesday, and Friday with hemodialysis.   . cloNIDine (CATAPRES - DOSED IN MG/24 HR) 0.1 mg/24hr patch Place 0.1 mg onto the skin once a week. 01/01/2018: THIS WAS DISCONTINUED AT DISCHARGE BUT PATIENT IS STILL TAKING ( as of 01/01/18)   . lidocaine-prilocaine (EMLA) cream Apply 1 application topically 3 (three) times a week. APPLY SMALL AMOUNT TO ACCESS SITE (AVF) 3 TIMES A WEEK 1 HOUR BEFORE DIALYSIS. COVER WITH OCCLUSIVE DRESSING (SARAN WRAP)   . nitroGLYCERIN (NITROSTAT) 0.3 MG SL tablet Place 1 tablet (0.3 mg total) under the tongue every 5 (five) minutes as needed for chest pain.   . promethazine (PHENERGAN) 12.5 MG tablet Take 1 tablet (12.5 mg total) by mouth every 6 (six) hours as needed for nausea  or vomiting (Take 1 prior to dialysis).   . sucroferric oxyhydroxide (VELPHORO) 500 MG chewable tablet Chew 500 mg by mouth 3 (three) times daily with meals. 01/01/2018: On order from dialysis center pharmacy as of 01/01/18  . [DISCONTINUED] ferric citrate (AURYXIA) 1 GM 210 MG(Fe) tablet Take 210 mg by mouth 3 (three) times daily with meals.   . [DISCONTINUED] polyethylene glycol (MIRALAX / GLYCOLAX) packet Take 17 g by mouth daily. (Patient not taking: Reported on 11/27/2017)   . [DISCONTINUED] senna-docusate (SENOKOT-S) 8.6-50 MG tablet Take 1 tablet by mouth at bedtime as needed for mild constipation or moderate constipation. (Patient not taking: Reported on 01/01/2018)    No facility-administered encounter medications on file as of 01/01/2018.     Functional Status: In your present state of health, do you have any difficulty performing the following activities: 12/25/2017 12/25/2017  Hearing? - N  Vision? - N  Difficulty concentrating or making decisions? - N  Walking or climbing stairs? - Y  Comment - -  Dressing or bathing? - N  Doing errands, shopping? Y -  Conservation officer, nature and eating ? - -  Using the Toilet? - -  In the past six months, have you accidently leaked urine? - -  Do you have problems with loss of bowel control? - -  Managing your Medications? - -  Managing your Finances? - -  Housekeeping or managing your Housekeeping? - -  Some recent data might be hidden    Fall/Depression Screening: Fall Risk  12/25/2017 10/30/2017  07/31/2017  Falls in the past year? Yes Yes Yes  Number falls in past yr: 1 1 1   Injury with Fall? No No Yes  Risk Factor Category  - - High Fall Risk  Risk for fall due to : - - History of fall(s)  Risk for fall due to: Comment - - -  Follow up - - Falls prevention discussed   PHQ 2/9 Scores 12/25/2017 12/24/2017 10/30/2017 08/07/2017 07/31/2017 07/31/2017 07/22/2017  PHQ - 2 Score 0 0 0 0 0 0 0    Assessment:  Cardiovascular: Amlodipine-patient reported  taking this M.W, F before dialysis only Nitroglycerin Aspirin Clonidine 0.1 patches  Gastrointestinal: Promethazine (PRN)  Renal: Calcitriol Auryxia--on the medication list at the dialysis center but the patient does not have.  Topical: Lidocaine-prilocaine cream (used at Dialysis center)  Pain: Acetaminophen  Medication Review findings:  Adherence:  Clonidine patches were discontinued per discharge. Patient reported still using them. Her granddaughter confirmed the patient had a patch on while we were speaking.  It was not clear if the patient had on a Clonidine 0.2 patch Clonidine 0.1mg .  Patient's granddaughter called her PCP office this afternoon stating that the patient was dizzy.  The PCP office was called to let them know the patient is still wearing a clonidine patch.  Auryxia-nurse at the dialysis center said the patient should be taking [name]. Lorin Picket was listed as discontinued in a note during the patient's most recent discharge.  Patient said Lorin Picket was discontinued because it was causing her stomach to hurt and was the culprit of her nausea and vomiting.  (She does not want to take [name] and did not have any in her possession.  Calcium Acetate-was listed on the discharge summary but the patient did not report having any Calcium Acetate 667mg  tablets.  (The medication list at the dialysis center listed Auryxia in the place of Calcium Acetate).  Dialysis center nutritionist back and clarified the patient should be on Velphoro 500mg  Chew 1 tablet three times daily--patient's medication list was updated.  Patient does not have this medication but it has been ordered from the [facility]  Medications removed from the patient's medication list because the patient said she is no longer taking:  Polyethylene/Glycol Sen[place]ot-S    Plan/Intervention:   Note will be routed to patient's PCP. Patient's PCP office was called to alert them about  the Clonidine since the patient is still wearing a Clonidine patch despite it being discontinued.  Follow up with the patient in 1-2 business days.    Elayne Guerin, PharmD, [place] Clinical Pharmacist [phone]

## 2018-01-01 NOTE — Telephone Encounter (Signed)
Nancy [name]; Pt is not using the patches they are discont

## 2018-01-02 ENCOUNTER — Other Ambulatory Visit: Payer: Self-pay

## 2018-01-02 DIAGNOSIS — N2581 Secondary hyperparathyroidism of renal origin: Secondary | ICD-10-CM | POA: Diagnosis not present

## 2018-01-02 DIAGNOSIS — D689 Coagulation defect, unspecified: Secondary | ICD-10-CM | POA: Diagnosis not present

## 2018-01-02 DIAGNOSIS — D631 Anemia in chronic kidney disease: Secondary | ICD-10-CM | POA: Diagnosis not present

## 2018-01-02 DIAGNOSIS — R51 Headache: Secondary | ICD-10-CM | POA: Diagnosis not present

## 2018-01-02 DIAGNOSIS — N186 End stage renal disease: Secondary | ICD-10-CM | POA: Diagnosis not present

## 2018-01-02 NOTE — Telephone Encounter (Signed)
Please schedule her for an appointment in [facility] as soon as possible. She was supposed to have dc'd her clonidine patch but is still using it per [facility]. This may be causing her dizziness.

## 2018-01-02 NOTE — Telephone Encounter (Signed)
Daughter called in states patient refuses to take off patch. Could hear patient in background yelling she won't take off the patch. States it's not the patch that's making her sick but the pills. Daughter holding BP med at this time since pt refuses to take off patch. Would like a call back from PCP to discuss. Patient receives dialysis M W F. Transportation picks patient up at 1030 and procedure ends at 3:30. No appts available in [facility] on T or Th next week. Patient can come Tue 01/06/2018 at [place]. Will route to PCP to see if patient can be added to his continuity clinic as double booking. Hubbard Hartshorn, RN, BSN

## 2018-01-02 NOTE — Patient Outreach (Addendum)
[facility]) Care Management  01/02/2018  Nancy [name] 09-24-1940 [id number]   RNCM called to follow up. RNCM called both client's daughter and client's number. No answer. HIPPA compliant message left.  Plan: continue to follow.  Thea Silversmith, RN, MSN, [facility] Coordinator Cell: [phone]  Addendum: RNCM notes that client's granddaughter has been spoken with someone in primary care office and been advised. Plan: attempt to reach client next week to schedule a home visit.

## 2018-01-02 NOTE — Telephone Encounter (Signed)
I do not know what my schedule is like on Tuesday and I have never actually seen this patient in my clinic as she was recently transferred to my clinic and I do not know if I will be able to see her in my continuity clinic. If she refuses to take off the patch then please hold her oral BP medication (amlodipine)

## 2018-01-04 DIAGNOSIS — N186 End stage renal disease: Secondary | ICD-10-CM | POA: Diagnosis not present

## 2018-01-04 DIAGNOSIS — Z992 Dependence on renal dialysis: Secondary | ICD-10-CM | POA: Diagnosis not present

## 2018-01-04 DIAGNOSIS — I150 Renovascular hypertension: Secondary | ICD-10-CM | POA: Diagnosis not present

## 2018-01-05 ENCOUNTER — Ambulatory Visit: Payer: Self-pay | Admitting: Pharmacist

## 2018-01-05 ENCOUNTER — Other Ambulatory Visit: Payer: Self-pay | Admitting: Pharmacist

## 2018-01-05 ENCOUNTER — Telehealth: Payer: Self-pay | Admitting: *Deleted

## 2018-01-05 DIAGNOSIS — N2581 Secondary hyperparathyroidism of renal origin: Secondary | ICD-10-CM | POA: Diagnosis not present

## 2018-01-05 DIAGNOSIS — D631 Anemia in chronic kidney disease: Secondary | ICD-10-CM | POA: Diagnosis not present

## 2018-01-05 DIAGNOSIS — D509 Iron deficiency anemia, unspecified: Secondary | ICD-10-CM | POA: Diagnosis not present

## 2018-01-05 DIAGNOSIS — R51 Headache: Secondary | ICD-10-CM | POA: Diagnosis not present

## 2018-01-05 DIAGNOSIS — N186 End stage renal disease: Secondary | ICD-10-CM | POA: Diagnosis not present

## 2018-01-05 DIAGNOSIS — D689 Coagulation defect, unspecified: Secondary | ICD-10-CM | POA: Diagnosis not present

## 2018-01-05 NOTE — Telephone Encounter (Signed)
Thank you :)

## 2018-01-05 NOTE — Telephone Encounter (Signed)
Ubaldo Glassing, RN with [facility] called requesting resumption of care orders following hosp d/c. Verbal auth given. Ubaldo Glassing will be discontinuing services tomorrow 2/2 patient non-compliance. States pt will not taker her meds and even throws them away per granddaughter. [facility] PharmD has attempted to contact patient for medication management and patient has appt tomorrow in [facility]. Hubbard Hartshorn, RN, BSN

## 2018-01-05 NOTE — Telephone Encounter (Signed)
Patient is sch for 01/06/2018 with the [facility].

## 2018-01-05 NOTE — Patient Outreach (Addendum)
[facility]) Care Management  01/05/2018  [place] B Archambeau 10-30-1940 [id number]   Called patient and granddaughter regarding medication management.  Unfortunately, neither answered the phone.  HIPPA compliant message left on granddaughter's voicemail.  Patient does not have voicemail set up.  Patient has a PCP visit scheduled on 01/06/18.   Plan:  Follow up with patient in 3-5 business days.  Elayne Guerin, PharmD, BCACP [facility] Clinical Pharmacist [phone]   ADDENDUM  Patient's granddaughter called me back. HIPAA identifiers were obtained. She confirmed her grandmother finally agreed to take the clonidine patch off and will now be taking amlodipine 5mg  1 tablet daily.  Patient has a PCP visit on 01/06/18.  Plan: Follow up with patient in 5-7 business days.  Elayne Guerin, PharmD, [place] Clinical Pharmacist [phone]

## 2018-01-06 ENCOUNTER — Ambulatory Visit (INDEPENDENT_AMBULATORY_CARE_PROVIDER_SITE_OTHER): Payer: Medicare Other | Admitting: Internal Medicine

## 2018-01-06 ENCOUNTER — Encounter: Payer: Self-pay | Admitting: Internal Medicine

## 2018-01-06 VITALS — BP 124/46 | HR 73 | Temp 98.4°F | Wt 133.0 lb

## 2018-01-06 DIAGNOSIS — I120 Hypertensive chronic kidney disease with stage 5 chronic kidney disease or end stage renal disease: Secondary | ICD-10-CM | POA: Diagnosis not present

## 2018-01-06 DIAGNOSIS — N186 End stage renal disease: Secondary | ICD-10-CM

## 2018-01-06 DIAGNOSIS — Z9114 Patient's other noncompliance with medication regimen: Secondary | ICD-10-CM | POA: Diagnosis not present

## 2018-01-06 DIAGNOSIS — G4700 Insomnia, unspecified: Secondary | ICD-10-CM | POA: Diagnosis not present

## 2018-01-06 DIAGNOSIS — Z8673 Personal history of transient ischemic attack (TIA), and cerebral infarction without residual deficits: Secondary | ICD-10-CM

## 2018-01-06 DIAGNOSIS — R011 Cardiac murmur, unspecified: Secondary | ICD-10-CM

## 2018-01-06 DIAGNOSIS — I10 Essential (primary) hypertension: Secondary | ICD-10-CM

## 2018-01-06 DIAGNOSIS — I132 Hypertensive heart and chronic kidney disease with heart failure and with stage 5 chronic kidney disease, or end stage renal disease: Secondary | ICD-10-CM | POA: Diagnosis not present

## 2018-01-06 DIAGNOSIS — I350 Nonrheumatic aortic (valve) stenosis: Secondary | ICD-10-CM

## 2018-01-06 DIAGNOSIS — Z992 Dependence on renal dialysis: Secondary | ICD-10-CM

## 2018-01-06 DIAGNOSIS — I509 Heart failure, unspecified: Secondary | ICD-10-CM | POA: Diagnosis not present

## 2018-01-06 DIAGNOSIS — I340 Nonrheumatic mitral (valve) insufficiency: Secondary | ICD-10-CM

## 2018-01-06 DIAGNOSIS — K5792 Diverticulitis of intestine, part unspecified, without perforation or abscess without bleeding: Secondary | ICD-10-CM | POA: Diagnosis not present

## 2018-01-06 MED ORDER — PROMETHAZINE HCL 12.5 MG PO TABS: 12.5000 mg | ORAL_TABLET | Freq: Four times a day (QID) | ORAL | 0 refills | Status: DC | PRN

## 2018-01-06 NOTE — Assessment & Plan Note (Addendum)
Assessment:  Patient denies further recurrence of nausea, vomiting or abdominal pain since discharge. Has been taking her Promethazine prior to HD without issue and reports she's attended all HD sessions since discharge. Family agrees she has been doing well at home.  Plan: I'm pleased she has not had recurrent symptoms since taking promethazine prior to her dialysis sessions. Ensured patient has enough promethazine for the next several weeks until she's able to see her PCP at his next available appointment. While mesenteric US demonstrated patent celiac and SMA arteries (IMA not visualized) there still was some concern for transient mesenteric ischemia. Could consider Mesenteric Angiography if her symptoms were to recur for further evaluation. This was discussed with the patient today but she wanted to think about it. Asked patient to discuss this with PCP at their appointment.

## 2018-01-06 NOTE — Patient Instructions (Addendum)
It was nice seeing you today. Thank you for choosing [facility] for your Primary Care.   Today we talked about:  1) Nausea: I am so glad your nausea, vomiting and abdominal pain have improved significantly. Please continue your current management of Promethazine prior to dialysis.  2) Your blood pressure was great today despite not taking any medications. Please continue holding Amlodipine for now. We will not be prescribing the clonidine patch at this point.    FOLLOW-UP INSTRUCTIONS When: August - Dr. Dareen Piano 1st available  For: PCP follow up What to bring: medications   Please contact the clinic if you have any problems, or need to be seen sooner.

## 2018-01-06 NOTE — Telephone Encounter (Signed)
Patient completed HFU today. Hubbard Hartshorn, RN, BSN

## 2018-01-06 NOTE — Progress Notes (Signed)
Appt with [facility] 01/06/2018

## 2018-01-06 NOTE — Progress Notes (Signed)
   CC: HFU nausea, vomiting  HPI:  [name] is a 77 y.o. F with history of intractable nausea and vomiting, ESRD on HD MWF, HTN, history of CVA and AS and mitral regurg who presents today for hospital follow-up (admitted 6/20-6/25 with a recurrence of her intractable nausea and vomiting).   For details regarding today's visit and the status of their chronic medical issues, please refer to the assessment and plan. She has no acute complaints.   Past Medical History:  Diagnosis Date  . Anemia   . Aortic stenosis   . Bacterial sinusitis 09/17/2011  . CHF (congestive heart failure) ([place])   . CKD (chronic kidney disease) stage 4, GFR 15-29 ml/min ([place]) 08/11/2006   Cr continues to increase. Proteinuria on UA 02/10/12.    . Colitis   . CVA (cerebrovascular accident) [facility])    New hemorrhagic per CT scan '09  . Diverticulosis of colon   . Dysfunctional uterine bleeding   . ESRD (end stage renal disease) on dialysis ([place])    "MWF; [facility]" (11/27/2017)  . Fecal impaction ([place])   . Headache(784.0)   . Heart murmur   . HERNIORRHAPHY, HX OF 08/11/2006  . Hypertension   . OA (osteoarthritis)    bilateral knees  . Postmenopausal   . Pulmonary nodule   . TINEA CRURIS 01/12/2007   Review of Systems  General: Denies fevers, chills HEENT: Denies acute changes in vision, headache Cardiac: Denies CP, SOB Abd: Denies nausea, vomiting, abdominal pain, changes in bowels Extremities: Denies weakness or swelling  Physical Exam General: Alert, in no acute distress. Not particularly interested in talking today. Granddaughters present.  Cardiac: RRR, + murmur Pulmonary: CTA BL with normal WOB on RA. Able to speak in complete sentences Abd: Soft, non-tender. Not distended. +bs Extremities: Warm, perfused. No significant pedal edema. Ambulates with cane.    Vitals:   01/06/18 1100  BP: (!) 124/46  Pulse: 73  Temp: 98.4 F (36.9 C)  TempSrc: Oral  SpO2: 98%  Weight: 133 lb (60.3 kg)    Body mass index is 23.56 kg/m.  Assessment & Plan:   See Encounters Tab for problem based charting.  Patient discussed with Dr. Rebeca Alert

## 2018-01-06 NOTE — Assessment & Plan Note (Addendum)
Assessment:  BP today 120/ despite not taking Amlodipine nor wearing her Clonidine patch in several days. Family confirms she has not been taking any BP medications but do mention significant tension at home regarding this issue. Reviewed [facility] notes, apparently patient had still been wearing her Clonidine patch after discharge and refused to have it removed. She tells me she wants to keep the patch on because she doesn't want to take pills.      Plan: Discussed her issues with transient hypotension during HD and that the patch is too risky right now because of this. She seemed to understand and asked about Norvasc as she hasn't taken this in several days. BP today normal. I think its OK to hold Norvasc for now to reduce risk of transient hypotension with HD.  -Called pharmacy to ensure Clonidine Rx has been discontinued -Advised patient and family to continue holding Amlodipine for now

## 2018-01-09 NOTE — Progress Notes (Signed)
Internal Medicine Clinic Attending  Case discussed with Dr. Danford Bad  at the time of the visit.  We reviewed the resident's history and exam and pertinent patient test results.  I agree with the assessment, diagnosis, and plan of care documented in the resident's note.  Oda Kilts, MD

## 2018-01-11 ENCOUNTER — Other Ambulatory Visit: Payer: Self-pay

## 2018-01-11 ENCOUNTER — Observation Stay (HOSPITAL_COMMUNITY)
Admission: EM | Admit: 2018-01-11 | Discharge: 2018-01-13 | Disposition: A | Discharge status: Home / Self Care | Payer: Medicare Other | Attending: Internal Medicine | Admitting: Internal Medicine

## 2018-01-11 ENCOUNTER — Encounter (HOSPITAL_COMMUNITY): Payer: Self-pay | Admitting: Emergency Medicine

## 2018-01-11 ENCOUNTER — Observation Stay (HOSPITAL_COMMUNITY): Payer: Medicare Other

## 2018-01-11 DIAGNOSIS — E875 Hyperkalemia: Secondary | ICD-10-CM | POA: Diagnosis not present

## 2018-01-11 DIAGNOSIS — R0602 Shortness of breath: Secondary | ICD-10-CM | POA: Diagnosis not present

## 2018-01-11 DIAGNOSIS — K219 Gastro-esophageal reflux disease without esophagitis: Secondary | ICD-10-CM | POA: Insufficient documentation

## 2018-01-11 DIAGNOSIS — N2581 Secondary hyperparathyroidism of renal origin: Secondary | ICD-10-CM | POA: Diagnosis not present

## 2018-01-11 DIAGNOSIS — E860 Dehydration: Secondary | ICD-10-CM | POA: Diagnosis not present

## 2018-01-11 DIAGNOSIS — I700 Atherosclerosis of aorta: Secondary | ICD-10-CM | POA: Diagnosis not present

## 2018-01-11 DIAGNOSIS — I132 Hypertensive heart and chronic kidney disease with heart failure and with stage 5 chronic kidney disease, or end stage renal disease: Secondary | ICD-10-CM | POA: Insufficient documentation

## 2018-01-11 DIAGNOSIS — Z79899 Other long term (current) drug therapy: Secondary | ICD-10-CM | POA: Insufficient documentation

## 2018-01-11 DIAGNOSIS — R1111 Vomiting without nausea: Secondary | ICD-10-CM | POA: Diagnosis not present

## 2018-01-11 DIAGNOSIS — Z9114 Patient's other noncompliance with medication regimen: Secondary | ICD-10-CM | POA: Insufficient documentation

## 2018-01-11 DIAGNOSIS — Z8719 Personal history of other diseases of the digestive system: Secondary | ICD-10-CM | POA: Insufficient documentation

## 2018-01-11 DIAGNOSIS — A084 Viral intestinal infection, unspecified: Principal | ICD-10-CM | POA: Insufficient documentation

## 2018-01-11 DIAGNOSIS — Z7982 Long term (current) use of aspirin: Secondary | ICD-10-CM | POA: Diagnosis not present

## 2018-01-11 DIAGNOSIS — E8889 Other specified metabolic disorders: Secondary | ICD-10-CM | POA: Insufficient documentation

## 2018-01-11 DIAGNOSIS — R197 Diarrhea, unspecified: Secondary | ICD-10-CM

## 2018-01-11 DIAGNOSIS — R05 Cough: Secondary | ICD-10-CM

## 2018-01-11 DIAGNOSIS — M170 Bilateral primary osteoarthritis of knee: Secondary | ICD-10-CM | POA: Insufficient documentation

## 2018-01-11 DIAGNOSIS — E872 Acidosis: Secondary | ICD-10-CM | POA: Diagnosis not present

## 2018-01-11 DIAGNOSIS — R059 Cough, unspecified: Secondary | ICD-10-CM

## 2018-01-11 DIAGNOSIS — I350 Nonrheumatic aortic (valve) stenosis: Secondary | ICD-10-CM | POA: Insufficient documentation

## 2018-01-11 DIAGNOSIS — Z885 Allergy status to narcotic agent status: Secondary | ICD-10-CM | POA: Insufficient documentation

## 2018-01-11 DIAGNOSIS — N186 End stage renal disease: Secondary | ICD-10-CM | POA: Diagnosis not present

## 2018-01-11 DIAGNOSIS — R110 Nausea: Secondary | ICD-10-CM | POA: Diagnosis not present

## 2018-01-11 DIAGNOSIS — R053 Chronic cough: Secondary | ICD-10-CM

## 2018-01-11 DIAGNOSIS — Z992 Dependence on renal dialysis: Secondary | ICD-10-CM | POA: Insufficient documentation

## 2018-01-11 DIAGNOSIS — Z8249 Family history of ischemic heart disease and other diseases of the circulatory system: Secondary | ICD-10-CM | POA: Insufficient documentation

## 2018-01-11 DIAGNOSIS — R918 Other nonspecific abnormal finding of lung field: Secondary | ICD-10-CM | POA: Insufficient documentation

## 2018-01-11 DIAGNOSIS — I5032 Chronic diastolic (congestive) heart failure: Secondary | ICD-10-CM | POA: Diagnosis not present

## 2018-01-11 DIAGNOSIS — E878 Other disorders of electrolyte and fluid balance, not elsewhere classified: Secondary | ICD-10-CM | POA: Diagnosis not present

## 2018-01-11 DIAGNOSIS — R112 Nausea with vomiting, unspecified: Secondary | ICD-10-CM | POA: Diagnosis not present

## 2018-01-11 DIAGNOSIS — Z66 Do not resuscitate: Secondary | ICD-10-CM | POA: Diagnosis not present

## 2018-01-11 DIAGNOSIS — Z8673 Personal history of transient ischemic attack (TIA), and cerebral infarction without residual deficits: Secondary | ICD-10-CM | POA: Insufficient documentation

## 2018-01-11 DIAGNOSIS — I120 Hypertensive chronic kidney disease with stage 5 chronic kidney disease or end stage renal disease: Secondary | ICD-10-CM | POA: Diagnosis not present

## 2018-01-11 DIAGNOSIS — R1310 Dysphagia, unspecified: Secondary | ICD-10-CM | POA: Insufficient documentation

## 2018-01-11 DIAGNOSIS — E43 Unspecified severe protein-calorie malnutrition: Secondary | ICD-10-CM

## 2018-01-11 LAB — COMPREHENSIVE METABOLIC PANEL
ALT: 8 U/L (ref 0–44)
ANION GAP: 19 — AB (ref 5–15)
AST: 10 U/L — ABNORMAL LOW (ref 15–41)
Albumin: 3 g/dL — ABNORMAL LOW (ref 3.5–5.0)
Alkaline Phosphatase: 39 U/L (ref 38–126)
BUN: 74 mg/dL — ABNORMAL HIGH (ref 8–23)
CO2: 20 mmol/L — ABNORMAL LOW (ref 22–32)
CREATININE: 12.48 mg/dL — AB (ref 0.44–1.00)
Calcium: 8.9 mg/dL (ref 8.9–10.3)
Chloride: 97 mmol/L — ABNORMAL LOW (ref 98–111)
GFR, EST AFRICAN AMERICAN: 3 mL/min — AB (ref >60)
GFR, EST NON AFRICAN AMERICAN: 3 mL/min — AB (ref >60)
Glucose, Bld: 76 mg/dL (ref 70–99)
POTASSIUM: 5.4 mmol/L — AB (ref 3.5–5.1)
Sodium: 136 mmol/L (ref 135–145)
Total Bilirubin: 0.7 mg/dL (ref 0.3–1.2)
Total Protein: 6.4 g/dL — ABNORMAL LOW (ref 6.5–8.1)

## 2018-01-11 LAB — CBC WITH DIFFERENTIAL/PLATELET
Abs Immature Granulocytes: 0 10*3/uL (ref 0.0–0.1)
BASOS PCT: 0 %
Basophils Absolute: 0 10*3/uL (ref 0.0–0.1)
EOS ABS: 0 10*3/uL (ref 0.0–0.7)
Eosinophils Relative: 1 %
HCT: 33.5 % — ABNORMAL LOW (ref 36.0–46.0)
Hemoglobin: 10.4 g/dL — ABNORMAL LOW (ref 12.0–15.0)
Immature Granulocytes: 1 %
Lymphocytes Relative: 19 %
Lymphs Abs: 0.7 10*3/uL (ref 0.7–4.0)
MCH: 30.7 pg (ref 26.0–34.0)
MCHC: 31 g/dL (ref 30.0–36.0)
MCV: 98.8 fL (ref 78.0–100.0)
MONO ABS: 0.4 10*3/uL (ref 0.1–1.0)
MONOS PCT: 9 %
NEUTROS PCT: 70 %
Neutro Abs: 2.6 10*3/uL (ref 1.7–7.7)
PLATELETS: 168 10*3/uL (ref 150–400)
RBC: 3.39 MIL/uL — ABNORMAL LOW (ref 3.87–5.11)
RDW: 14.6 % (ref 11.5–15.5)
WBC: 3.7 10*3/uL — AB (ref 4.0–10.5)

## 2018-01-11 LAB — SODIUM, URINE, RANDOM: SODIUM UR: 80 mmol/L

## 2018-01-11 MED ORDER — ACETAMINOPHEN 325 MG PO TABS
650.0000 mg | ORAL_TABLET | Freq: Once | ORAL | Status: AC
Start: 2018-01-11 — End: 2018-01-11
  Administered 2018-01-11: 650 mg via ORAL
  Filled 2018-01-11: qty 2

## 2018-01-11 MED ORDER — SODIUM CHLORIDE 0.9 % IV BOLUS
500.0000 mL | Freq: Once | INTRAVENOUS | Status: AC
  Administered 2018-01-11: 500 mL via INTRAVENOUS

## 2018-01-11 MED ORDER — ACETAMINOPHEN 650 MG RE SUPP: 650.0000 mg | Freq: Four times a day (QID) | RECTAL | Status: DC | PRN

## 2018-01-11 MED ORDER — SODIUM CHLORIDE 0.9 % IV SOLN
125.0000 mg | INTRAVENOUS | Status: DC
  Administered 2018-01-12: 125 mg via INTRAVENOUS
  Filled 2018-01-11 (×2): qty 10

## 2018-01-11 MED ORDER — HEPARIN SODIUM (PORCINE) 5000 UNIT/ML IJ SOLN
5000.0000 [IU] | Freq: Three times a day (TID) | INTRAMUSCULAR | Status: DC
  Administered 2018-01-11 – 2018-01-13 (×5): 5000 [IU] via SUBCUTANEOUS
  Filled 2018-01-11 (×4): qty 1

## 2018-01-11 MED ORDER — SODIUM POLYSTYRENE SULFONATE 15 GM/60ML PO SUSP
15.0000 g | Freq: Once | ORAL | Status: AC
  Administered 2018-01-11: 15 g via ORAL
  Filled 2018-01-11: qty 60

## 2018-01-11 MED ORDER — ONDANSETRON HCL 4 MG/2ML IJ SOLN: 4.0000 mg | Freq: Four times a day (QID) | INTRAMUSCULAR | Status: DC | PRN

## 2018-01-11 MED ORDER — SODIUM CHLORIDE 0.9% FLUSH
3.0000 mL | Freq: Two times a day (BID) | INTRAVENOUS | Status: DC
  Administered 2018-01-11: 3 mL via INTRAVENOUS

## 2018-01-11 MED ORDER — CALCITRIOL 0.5 MCG PO CAPS
2.0000 ug | ORAL_CAPSULE | ORAL | Status: DC
  Administered 2018-01-12: 2 ug via ORAL
  Filled 2018-01-11: qty 4

## 2018-01-11 MED ORDER — ACETAMINOPHEN 325 MG PO TABS: 650.0000 mg | ORAL_TABLET | Freq: Four times a day (QID) | ORAL | Status: DC | PRN

## 2018-01-11 MED ORDER — SUCROFERRIC OXYHYDROXIDE 500 MG PO CHEW
500.0000 mg | CHEWABLE_TABLET | Freq: Three times a day (TID) | ORAL | Status: DC
  Filled 2018-01-11 (×6): qty 1

## 2018-01-11 MED ORDER — DARBEPOETIN ALFA 100 MCG/0.5ML IJ SOSY
100.0000 ug | PREFILLED_SYRINGE | INTRAMUSCULAR | Status: DC
  Administered 2018-01-12: 100 ug via INTRAVENOUS
  Filled 2018-01-11: qty 0.5

## 2018-01-11 MED ORDER — AMLODIPINE BESYLATE 5 MG PO TABS
5.0000 mg | ORAL_TABLET | Freq: Every day | ORAL | Status: DC
  Administered 2018-01-11 – 2018-01-13 (×3): 5 mg via ORAL
  Filled 2018-01-11 (×4): qty 1

## 2018-01-11 MED ORDER — CHLORHEXIDINE GLUCONATE CLOTH 2 % EX PADS: 6.0000 | MEDICATED_PAD | Freq: Every day | CUTANEOUS | Status: DC

## 2018-01-11 MED ORDER — ONDANSETRON HCL 4 MG PO TABS: 4.0000 mg | ORAL_TABLET | Freq: Four times a day (QID) | ORAL | Status: DC | PRN

## 2018-01-11 NOTE — Progress Notes (Signed)
Patient due for amlodipine 5 mg PO. Patient states, "The doctor took me off of that last week because it makes my B/P drop too low." MD notified. MD stated to hold. Orders followed. Will continue to monitor.

## 2018-01-11 NOTE — ED Triage Notes (Signed)
Pt arrives via EMS from home with complaints of N/V/D since Tuesday that has caused her to miss Wed and Fri HD treatments. Pt reports generalized abd pain. Reports a chronic cough since last August.

## 2018-01-11 NOTE — Consult Note (Addendum)
[facility] Renal Consultation Note    Indication for Consultation:  Management of ESRD/hemodialysis; anemia, hypertension/volume and secondary hyperparathyroidism PCP: Aldine Contes, MD  HPI: Nancy [name] is a 77 y.o. female with ESRD on MWF HD at [facility] who presented to the ED having missed her Wed and Friday dialysis treatments last week.  She did attend 7/1 and ran nearly her full treatment with post wt of 59.6 and net UF 2.9 .  Pre HD BP were 110/54 sitting and 98/54 standing;  Post HD BP were 102/43 sitting and 125/476 standing HR 60s.  She presents to the ED today with N, V, D since last Tuesday.  When she saw her PCP last Tuesday, she reported no symptoms.  Today she tells me the last time she vomited was last Wednesday but she has been "having diarrhea all week". She has lower mid abdominal pain, no dysuria.  She has a chronic cough with SOB at times.  According to PCP note from 7/2 there are some issues regarding compliance with medications.  Work up in the ED shows K 5.4 BUN 76 Cr 12.4 CO2 20 normal LFT, alb 3 normal WBC/diff hgb 10.5 plts 168. She is afebrile and SBP is 150-160s.  She has been taking phenergan for nausea.   Past Medical History:  Diagnosis Date  . Anemia   . Aortic stenosis   . Bacterial sinusitis 09/17/2011  . CHF (congestive heart failure) ([place])   . CKD (chronic kidney disease) stage 4, GFR 15-29 ml/min ([place]) 08/11/2006   Cr continues to increase. Proteinuria on UA 02/10/12.    . Colitis   . CVA (cerebrovascular accident) [facility])    New hemorrhagic per CT scan '09  . Diverticulosis of colon   . Dysfunctional uterine bleeding   . ESRD (end stage renal disease) on dialysis ([place])    "MWF; [facility]" (11/27/2017)  . Fecal impaction ([place])   . Headache(784.0)   . Heart murmur   . HERNIORRHAPHY, HX OF 08/11/2006  . Hypertension   . OA (osteoarthritis)    bilateral knees  . Postmenopausal   . Pulmonary nodule   . TINEA CRURIS 01/12/2007   Past  Surgical History:  Procedure Laterality Date  . ABDOMINAL HYSTERECTOMY    . AV FISTULA PLACEMENT Left 02/19/2017   Procedure: CREATION OF LEFT ARM BRACHIOCEPHALIC ARTERIOVENOUS (AV) FISTULA;  Surgeon: Rosetta Posner, MD;  Location: [place];  Service: Vascular;  Laterality: Left;  . BASCILIC VEIN TRANSPOSITION Left 04/23/2017   Procedure: BASILIC VEIN TRANSPOSITION SECOND STAGE;  Surgeon: Rosetta Posner, MD;  Location: [place];  Service: Vascular;  Laterality: Left;  . CHOLECYSTECTOMY  2009  . COLONOSCOPY    . INGUINAL HERNIA REPAIR  2008  . INSERTION OF DIALYSIS CATHETER Right 02/19/2017   Procedure: INSERTION OF TUNNELED DIALYSIS CATHETER - RIGHT INTERNAL JUGULAR PLACEMENT;  Surgeon: Rosetta Posner, MD;  Location: [place];  Service: Vascular;  Laterality: Right;  . IRIDOTOMY / IRIDECTOMY     Laser, right eye 12/26/11 left eye 01/24/12  . MASS EXCISION Left 05/07/2013   Procedure: EXCISION CYST;  Surgeon: Myrtha Mantis., MD;  Location: [place];  Service: Ophthalmology;  Laterality: Left;   Family History  Problem Relation Age of Onset  . Hypertension Mother   . Congestive Heart Failure Mother   . Heart attack Brother 1   Social History:  reports that she has never smoked. She has never used smokeless tobacco. She reports that  she does not drink alcohol or use drugs. Allergies  Allergen Reactions  . Hydrocodone Nausea And Vomiting and Other (See Comments)    dizziness   Prior to Admission medications   Medication Sig Start Date End Date Taking? Authorizing Provider  acetaminophen (TYLENOL) 500 MG tablet Take 2 tablets (1,000 mg total) by mouth every 8 (eight) hours as needed for mild pain, moderate pain, fever or headache. 07/22/17  Yes Colbert Ewing, MD  aspirin EC 81 MG EC tablet Take 1 tablet (81 mg total) by mouth daily. 12/10/16  Yes Hoffman, Jessica Ratliff, DO  nitroGLYCERIN (NITROSTAT) 0.3 MG SL tablet Place 1 tablet (0.3 mg total) under the tongue every 5  (five) minutes as needed for chest pain. 02/27/17  Yes Sid Falcon, MD  promethazine (PHENERGAN) 12.5 MG tablet Take 1 tablet (12.5 mg total) by mouth every 6 (six) hours as needed for nausea or vomiting (Take 1 prior to dialysis). 01/06/18  Yes Molt, Bethany, DO  calcitRIOL (ROCALTROL) 0.5 MCG capsule Take 4 capsules (2 mcg total) by mouth every Monday, Wednesday, and Friday with hemodialysis. Patient not taking: Reported on 01/11/2018 12/30/17   Neva Seat, MD  sucroferric oxyhydroxide (VELPHORO) 500 MG chewable tablet Chew 500 mg by mouth 3 (three) times daily with meals.    Rexene Agent, MD   Current Facility-Administered Medications  Medication Dose Route Frequency Provider Last Rate Last Dose  . acetaminophen (TYLENOL) tablet 650 mg  650 mg Oral Q6H PRN [name], MD       Or  . acetaminophen (TYLENOL) suppository 650 mg  650 mg Rectal Q6H PRN [name], MD      . heparin injection 5,000 Units  5,000 Units Subcutaneous Q8H [name], MD      . ondansetron (ZOFRAN) tablet 4 mg  4 mg Oral Q6H PRN [name], MD       Or  . ondansetron (ZOFRAN) injection 4 mg  4 mg Intravenous Q6H PRN [name], MD      . sodium chloride flush (NS) 0.9 % injection 3 mL  3 mL Intravenous Q12H [name], MD       Current Outpatient Medications  Medication Sig Dispense Refill  . acetaminophen (TYLENOL) 500 MG tablet Take 2 tablets (1,000 mg total) by mouth every 8 (eight) hours as needed for mild pain, moderate pain, fever or headache. 30 tablet 0  . aspirin EC 81 MG EC tablet Take 1 tablet (81 mg total) by mouth daily. 30 tablet 1  . nitroGLYCERIN (NITROSTAT) 0.3 MG SL tablet Place 1 tablet (0.3 mg total) under the tongue every 5 (five) minutes as needed for chest pain. 30 tablet 0  . promethazine (PHENERGAN) 12.5 MG tablet Take 1 tablet (12.5 mg total) by mouth every 6 (six) hours as needed for nausea or vomiting (Take 1 prior to dialysis). 30 tablet 0  . calcitRIOL (ROCALTROL)  0.5 MCG capsule Take 4 capsules (2 mcg total) by mouth every Monday, Wednesday, and Friday with hemodialysis. (Patient not taking: Reported on 01/11/2018)    . sucroferric oxyhydroxide (VELPHORO) 500 MG chewable tablet Chew 500 mg by mouth 3 (three) times daily with meals.     Labs: Basic Metabolic Panel: Recent Labs  Lab 01/11/18 1026  NA 136  K 5.4*  CL 97*  CO2 20*  GLUCOSE 76  BUN 74*  CREATININE 12.48*  CALCIUM 8.9   Liver Function Tests: Recent Labs  Lab 01/11/18 1026  AST 10*  ALT 8  ALKPHOS 39  BILITOT 0.7  PROT 6.4*  ALBUMIN 3.0*   CBC: Recent Labs  Lab 01/11/18 1026  WBC 3.7*  NEUTROABS 2.6  HGB 10.4*  HCT 33.5*  MCV 98.8  PLT 168    ROS: As per HPI otherwise negative. Physical Exam: Vitals:   01/11/18 0945 01/11/18 1000 01/11/18 1211 01/11/18 1230  BP: (!) 156/63 (!) 157/72 (!) 177/79 (!) 167/79  Pulse: 96 96 97 95  Resp: 19 19 17 16   Temp:      TempSrc:      SpO2: 98% 95% 100% 99%  Weight:      Height:         General: elderly lady up in room cleaning self up after diarrhea stool Head: NCAT sclera not icteric MMM Neck: Supple.  Lungs: CTA bilaterally without wheezes, rales, or rhonchi. Breathing is unlabored. Heart: RRR with S1 S2. 3/6 murmur LUSB Abdomen: soft+ BS lower mid abd tenderness M-S:  Atrophic/dim muscle mass Lower extremities: without edema or ischemic changes, no open wounds  Neuro: A & O  X 3. Moves all extremities spontaneously. Psych:  Responds to questions appropriately with a normal affect. Dialysis Access: left upper AVF bounding + bruit  Dialysis Orders: [place] MWF 4 hr 300/800 EDW 58 2 K 2.5 Ca AVF -left upper heparin 300 venofer 100 through 7/24 - 10 doses not started yet Mircera 100 last given 6/19 - missed redose Labs:  hgb 10.4 22% sat iPTH 297 LAst AF 300 7/1 (normal F'gram April - AF had been 250 at that time and 2000 for recheck)  Assessment/Plan: 1. Diarrhea - with intermittent N and V- primary to admit -  observe trends while here in OBS be. Was given kayexalate which didn't help pre-existing diarrhea but also noted that V and D was not severe enough to cause hypokalemia 2. ESRD -  MWF - HD in am  K 5.4 - given kayexalate with diarrhea - before dose and since;  high BUN/Cr 76/12.48 - likely due to two missed treatments but need to watch AVF funcitoning 3. Hypertension/volume  - per primary note on 7/2 - issue with compliance with home meds - off clonidine patch now - just amlodipine 5- titrate volume; EDW recently raised 1 kg due to hypotension - check orthostatics - avoid hypotension  4. Anemia  - hgb 10.4 stable- overdue for redose of ESA - give on Monday and start course of Fe 5. Metabolic bone disease -  Sensipar stopped last admission thinking it might have contributed to N and V- still has at times so not entirely related to sensipar - calcium acetate 2 ac- resume tomorrow if not ordered with admission meds 6. Nutrition - alb 3 7.   DNR  Myriam Jacobson, PA-C [place] [phone] 01/11/2018, 2:23 PM   I have seen and examined this patient and agree with plan and assessment in the above note with renal recommendations/intervention highlighted.  Pt reports that she didn't go to HD on Friday due to diarrhea and malaise.  Currently with some vague abdominal pain and also had some N/V.  Exam is without rebound or guarding and lungs are clear.  Ok to have HD first session tomorrow morning.  Pt is amenable to plan.   Broadus John A Marquiz Sotelo,MD 01/11/2018 3:25 PM

## 2018-01-11 NOTE — Progress Notes (Signed)
MD stated to give 5mg  of amlodipine PO. Orders followed./ Will continue to monitor.

## 2018-01-11 NOTE — ED Provider Notes (Signed)
[facility] EMERGENCY DEPARTMENT Provider Note   CSN: [id number] Arrival date & time: 01/11/18  5638     History   Chief Complaint Chief Complaint  Patient presents with  . Emesis  . Nausea    HPI [place] Leyba is a 77 y.o. female.  HPI  77 yo female presents today complaining of nausea/vomiting/ diarrhea began on Tuesday 5 days ago, with emesis-nbnb, vomited Tuesday and Wednesday.  Took nausea pill with relief.  Patient states she has had icee yesterday.  She has had diarrhea beginning yesterday and is having frequent loose stools.  Complaining of crampy lower abdominal pain.  STates worse with cough.  Cough since last winter and hasn't gone away.  Patient goes to dialysis m,w, f but did not go Wednesday or Friday.  PMD patient followed in internal medicine clinic.    Past Medical History:  Diagnosis Date  . Anemia   . Aortic stenosis   . Bacterial sinusitis 09/17/2011  . CHF (congestive heart failure) ([place])   . CKD (chronic kidney disease) stage 4, GFR 15-29 ml/min ([place]) 08/11/2006   Cr continues to increase. Proteinuria on UA 02/10/12.    . Colitis   . CVA (cerebrovascular accident) [facility])    New hemorrhagic per CT scan '09  . Diverticulosis of colon   . Dysfunctional uterine bleeding   . ESRD (end stage renal disease) on dialysis ([place])    "MWF; [facility]" (11/27/2017)  . Fecal impaction ([place])   . Headache(784.0)   . Heart murmur   . HERNIORRHAPHY, HX OF 08/11/2006  . Hypertension   . OA (osteoarthritis)    bilateral knees  . Postmenopausal   . Pulmonary nodule   . TINEA CRURIS 01/12/2007    Patient Active Problem List   Diagnosis Date Noted  . Intractable nausea and vomiting 12/25/2017  . Midline thoracic back pain 12/23/2017  . Malnutrition of moderate degree 12/16/2017  . Refractory nausea and vomiting 11/27/2017  . Effusion, right knee   . Encephalopathy   . Secondary hyperparathyroidism of renal origin ([place]) 01/15/2017  . Mitral valve  annular calcification   . Constipation 10/15/2016  . Angina at [facility])   . Goals of care, counseling/discussion   . Metabolic acidosis 75/64/3329  . Aortic stenosis 05/21/2016  . Anemia associated with chronic renal failure 02/01/2016  . Atherosclerosis of aorta ([place]) 01/11/2015  . End stage renal disease ([place]) 02/04/2013  . Non-intractable vomiting with nausea 08/17/2012  . Glaucoma 03/18/2012  . Health care maintenance 09/17/2011  . Osteoarthrosis involving lower leg 10/31/2008  . History of CVA (cerebrovascular accident) 01/28/2008  . Hyperlipidemia 02/13/2007  . PULMONARY NODULES 01/12/2007  . Left ventricular hypertrophy 09/02/2006  . Essential hypertension 08/11/2006  . GERD 08/11/2006  . Diverticulitis of colon 08/11/2006    Past Surgical History:  Procedure Laterality Date  . ABDOMINAL HYSTERECTOMY    . AV FISTULA PLACEMENT Left 02/19/2017   Procedure: CREATION OF LEFT ARM BRACHIOCEPHALIC ARTERIOVENOUS (AV) FISTULA;  Surgeon: Rosetta Posner, MD;  Location: [place];  Service: Vascular;  Laterality: Left;  . BASCILIC VEIN TRANSPOSITION Left 04/23/2017   Procedure: BASILIC VEIN TRANSPOSITION SECOND STAGE;  Surgeon: Rosetta Posner, MD;  Location: [place];  Service: Vascular;  Laterality: Left;  . CHOLECYSTECTOMY  2009  . COLONOSCOPY    . INGUINAL HERNIA REPAIR  2008  . INSERTION OF DIALYSIS CATHETER Right 02/19/2017   Procedure: INSERTION OF TUNNELED DIALYSIS CATHETER - RIGHT INTERNAL JUGULAR PLACEMENT;  Surgeon:  Rosetta Posner, MD;  Location: [facility];  Service: Vascular;  Laterality: Right;  . IRIDOTOMY / IRIDECTOMY     Laser, right eye 12/26/11 left eye 01/24/12  . MASS EXCISION Left 05/07/2013   Procedure: EXCISION CYST;  Surgeon: Myrtha Mantis., MD;  Location: [place];  Service: Ophthalmology;  Laterality: Left;     OB History   None      Home Medications    Prior to Admission medications   Medication Sig Start Date End Date Taking?  Authorizing Provider  acetaminophen (TYLENOL) 500 MG tablet Take 2 tablets (1,000 mg total) by mouth every 8 (eight) hours as needed for mild pain, moderate pain, fever or headache. 07/22/17   Colbert Ewing, MD  aspirin EC 81 MG EC tablet Take 1 tablet (81 mg total) by mouth daily. 12/10/16   Valinda Party, DO  calcitRIOL (ROCALTROL) 0.5 MCG capsule Take 4 capsules (2 mcg total) by mouth every Monday, Wednesday, and Friday with hemodialysis. 12/30/17   Neva Seat, MD  lidocaine-prilocaine (EMLA) cream Apply 1 application topically 3 (three) times a week. APPLY SMALL AMOUNT TO ACCESS SITE (AVF) 3 TIMES A WEEK 1 HOUR BEFORE DIALYSIS. COVER WITH OCCLUSIVE DRESSING (SARAN WRAP) 11/20/17   [provider]  nitroGLYCERIN (NITROSTAT) 0.3 MG SL tablet Place 1 tablet (0.3 mg total) under the tongue every 5 (five) minutes as needed for chest pain. 02/27/17   Sid Falcon, MD  promethazine (PHENERGAN) 12.5 MG tablet Take 1 tablet (12.5 mg total) by mouth every 6 (six) hours as needed for nausea or vomiting (Take 1 prior to dialysis). 01/06/18   Molt, Bethany, DO  sucroferric oxyhydroxide (VELPHORO) 500 MG chewable tablet Chew 500 mg by mouth 3 (three) times daily with meals.    Rexene Agent, MD    Family History Family History  Problem Relation Age of Onset  . Hypertension Mother   . Congestive Heart Failure Mother   . Heart attack Brother 61    Social History Social History   Tobacco Use  . Smoking status: Never Smoker  . Smokeless tobacco: Never Used  Substance Use Topics  . Alcohol use: No    Alcohol/week: 0.0 oz  . Drug use: No    Comment: 08/15/08 UDS + cocaine     Allergies   Hydrocodone   Review of Systems Review of Systems   Physical Exam Updated Vital Signs BP (!) 153/66 (BP Location: Right Arm)   Pulse 88   Temp 98.1 F (36.7 C) (Oral)   Resp 18   Ht 1.6 m (5\' 3" )   Wt 60.3 kg (133 lb)   SpO2 99%   BMI 23.56 kg/m   Physical Exam    Constitutional: She is oriented to person, place, and time. She appears well-developed and well-nourished.  HENT:  Head: Normocephalic and atraumatic.  Right Ear: External ear normal.  Left Ear: External ear normal.  Eyes: Pupils are equal, round, and reactive to light. EOM are normal.  Neck: Normal range of motion. Neck supple.  Cardiovascular: Normal rate and regular rhythm.  Pulmonary/Chest: Effort normal and breath sounds normal.  Abdominal: Soft. Bowel sounds are normal. She exhibits no distension. There is no tenderness. There is no guarding.  Musculoskeletal: Normal range of motion.  Neurological: She is alert and oriented to person, place, and time.  Skin: Skin is warm and dry. Capillary refill takes less than 2 seconds.  Psychiatric: She has a normal mood and affect.  Nursing note and vitals reviewed.    ED Treatments / Results  Labs (all labs ordered are listed, but only abnormal results are displayed) Labs Reviewed - No data to display  EKG EKG Interpretation  Date/Time:  Sunday January 11 2018 07:50:08 EDT Ventricular Rate:  88 PR Interval:    QRS Duration: 106 QT Interval:  341 QTC Calculation: 413 R Axis:   33 Text Interpretation:  Sinus or ectopic atrial rhythm Borderline repolarization abnormality pr interval has increased nsst changes unchanged Confirmed by Pattricia Boss [phone]) on 01/11/2018 11:48:19 AM   Radiology No results found.  Procedures Procedures (including critical care time)  Medications Ordered in ED Medications - No data to display   Initial Impression / Assessment and Plan / ED Course  I have reviewed the triage vital signs and the nursing notes.  Pertinent labs & imaging results that were available during my care of the patient were reviewed by me and considered in my medical decision making (see chart for details).     1- n/diarrhea- 2-esrd- on dialysis but has not been for 6 days- creatinine elevated c.w. Missed dialysis and mild  hyperkalemia but does not appear volume overloaded- likely secondary to # 1 3- hyperkalemia  Final Clinical Impressions(s) / ED Diagnoses   Final diagnoses:  Hyperkalemia  ESRD (end stage renal disease) ([place])  Diarrhea, unspecified type    ED Discharge Orders    None       Pattricia Boss, MD 01/11/18 1232

## 2018-01-11 NOTE — ED Notes (Signed)
Patient transported to X-ray 

## 2018-01-11 NOTE — H&P (Addendum)
Date: 01/11/2018               Patient Name:  Nancy [name] MRN: [id number]  DOB: January 23, 1941 Age / Sex: 77 y.o., female   PCP: Aldine Contes, MD              Medical Service: Internal Medicine Teaching Service              Attending Physician: Dr. Pattricia Boss, MD    First Contact: Tommye Standard, MS4 Pager: [phone]  Second Contact: Dr. [name] Pager: [phone]  Third Contact Dr. Eppie Gibson         After Hours (After 5p/  First Contact Pager: [phone]  weekends / holidays): Second Contact Pager: [phone]   Chief Complaint: diarrhea, missed dialysis  History of Present Illness:  Nancy [name] is a 77 yo female with hx of ESRD on M/W/F HD, aortic stenosis with calcific vegetative mass, anemia, secondary hyperparathyroidism 2/2 renal disease, HFpEF, HTN, diverticulitis, GERD, and benign pulmonary nodules with multiple recent hospitalzations for intractable nausea and vomiting who presents today with 6day hx of diarrhea which caused her to miss schedule HD sessions. Pt denies hematochezia and melena. She endorses blood on the toilet paper x 1 on Thursday, but does have hemorrhoids. She endorses diffuse abdominal pain, worse in bilateral lower quadrants. She endorses vomiting x 1 on Thursday. Pt states she has not been able to eat for 7 days. She denies sick contacts or eating unusual foods. Denies recent abx use. She also has not been taking her medication this week because of her illness. Last dialysis was Monday 7/1. Pt missed Wednesday and Friday d/t diarrhea. Pt denies fever, endorses chills and sweats last night. She endorses left sided chest pain which she states feels like "my hiatal hernia." Additionally she endorses throat burning and night cough that are chronic. Pt endorses chronic poor vision. Pt does not likely taking pills and wishes to avoid pills when possible.     Meds: *pt is unsure of her medications but has not been taking anything for the past week, meds documented are per  chart review Current Meds  Medication Sig  . acetaminophen (TYLENOL) 500 MG tablet Take 2 tablets (1,000 mg total) by mouth every 8 (eight) hours as needed for mild pain, moderate pain, fever or headache.  Marland Kitchen aspirin EC 81 MG EC tablet Take 1 tablet (81 mg total) by mouth daily.  . nitroGLYCERIN (NITROSTAT) 0.3 MG SL tablet Place 1 tablet (0.3 mg total) under the tongue every 5 (five) minutes as needed for chest pain.  . promethazine (PHENERGAN) 12.5 MG tablet Take 1 tablet (12.5 mg total) by mouth every 6 (six) hours as needed for nausea or vomiting (Take 1 prior to dialysis).     Allergies: Allergies as of 01/11/2018 - Review Complete 01/11/2018  Allergen Reaction Noted  . Hydrocodone Nausea And Vomiting and Other (See Comments) 12/26/2017   Past Medical History:  Diagnosis Date  . Anemia   . Aortic stenosis   . Bacterial sinusitis 09/17/2011  . CHF (congestive heart failure) ([place])   . CKD (chronic kidney disease) stage 4, GFR 15-29 ml/min ([place]) 08/11/2006   Cr continues to increase. Proteinuria on UA 02/10/12.    . Colitis   . CVA ([facility]rebrovascular accident) [facility])    New hemorrhagic per CT scan '09  . Diverticulosis of colon   . Dysfunctional uterine bleeding   . ESRD (end stage renal disease) on dialysis ([place])    "MWF; [facility]" (11/27/2017)  . Fecal impaction ([place])   . Headache(784.0)   . Heart murmur   . HERNIORRHAPHY, HX OF 08/11/2006  . Hypertension   . OA (osteoarthritis)    bilateral knees  . Postmenopausal   . Pulmonary nodule   . TINEA CRURIS 01/12/2007    Family History:  HTN - mother CHF - mother MI - Brother 67  Social History:  Denies hx of tobacco, alcohol, drug use. UDS+ cocaine 2010.  Review of Systems: A complete ROS was negative except as per HPI.   Vials: Blood pressure (!) 177/79, pulse 97, temperature 98.1 F (36.7 C), temperature source Oral, resp. rate 17, height 5\' 3"  (1.6 m), weight 60.3 kg (133 lb), SpO2 100 %.  Physical Exam: Gen: NAD,  well appearing HEENT: EOMI, moist mucus membranes Cardio: RRR, S1, S2,  3/6 crescendo - decrescendo systolic murmur  Resp: LCAB, no increased WOB Abdominal: soft, no rebound, no guarding, diffuse mild pain throughout, +BS, no CVA tenderness Extremities: warm, 1+ bilateral LE edema Neuro: A&O x 3, normal speech  EKG: personally reviewed my interpretation is regular rate, 1st degree AV block with PR interval of 320, intervals otherwise WNL, chronic TWI of inferiolateral leads unchanged from prior EKG June 2019, no peaked T waves, no LVH present   Assessment & Plan by Problem: Nancy [name] is a 77 yo female with hx of ESRD on M/W/F HD, aortic stenosis with calcific vegetative mass, anemia, secondary hyperparathyroidism 2/2 renal disease, HFpEF, HTN, diverticulitis, GERD, and benign pulmonary nodules with multiple recent hospitalizations for intractable nausea and vomiting with HD who presents today with 6 day hx of diarrhea and missing HD sessions.  #diarrhea -DDx: leading dx is viral gastroenteritis, also possible is bacterial gastroenteritis. C diff is unlikely with no reported recent abx use. -s/p 524mL NS in ED Plan: -avoid maintenance fluids in setting of ESRD -Zofran PRN nausea -Tylenol PRN pain  #ESRD -scheduled M/W/F dialysis -last dialysis 7/1 Plan: -nephrology consulted, plan for HD tomorrow morning  #Hyperkalemia -5.4 on admission -s/p Kayexalate Plan: -FU BMP  -continue regularly scheduled HD  #anion gap metabolic acidosis 2/2 uremia -BUN 74  -no encephalopathy  Plan: -HD in the morning  #HTN -longstanding Plan: -monitor until after dialysis -hold home amlodipine  #Anemia -hgb stable Plan: -monitor   #DVT Prophylaxis -heparin  #Dispo: Inpatient admission due to diarrhea resulting in missing HD sessions, expected length of stay <24 hrs.    Signed: Christin Bach, Medical Student 01/11/2018, 12:33 PM  Pager: [phone]  Attestation for Student  Documentation:  I personally was present and performed or re-performed the history, physical exam and medical decision-making activities of this service and have verified that the service and findings are accurately documented in the student's note.  [name], MD 01/11/2018, 10:29 PM

## 2018-01-11 NOTE — Progress Notes (Signed)
New Admission Note:   Arrival Method: Stretcher Mental Orientation: A&O X4 Telemetry: Placed On Patient Assessment: Completed Skin: WDL IV: WDL Pain: Denies Safety Measures: Safety Fall Prevention Plan has been given, discussed and signed Admission: Completed Unit Orientation: Patient has been orientated to the room, unit and staff.  Family: Daughter at bedside  Orders have been reviewed and implemented. Will continue to monitor the patient. Call light has been placed within reach and bed alarm has been activated.    Dixie Dials RN, BSN

## 2018-01-12 ENCOUNTER — Ambulatory Visit: Payer: Self-pay | Admitting: Pharmacist

## 2018-01-12 ENCOUNTER — Other Ambulatory Visit: Payer: Self-pay | Admitting: Pharmacist

## 2018-01-12 ENCOUNTER — Observation Stay (HOSPITAL_COMMUNITY): Payer: Medicare Other

## 2018-01-12 DIAGNOSIS — R112 Nausea with vomiting, unspecified: Secondary | ICD-10-CM | POA: Diagnosis not present

## 2018-01-12 DIAGNOSIS — I132 Hypertensive heart and chronic kidney disease with heart failure and with stage 5 chronic kidney disease, or end stage renal disease: Secondary | ICD-10-CM

## 2018-01-12 DIAGNOSIS — D649 Anemia, unspecified: Secondary | ICD-10-CM

## 2018-01-12 DIAGNOSIS — Z8719 Personal history of other diseases of the digestive system: Secondary | ICD-10-CM

## 2018-01-12 DIAGNOSIS — N2581 Secondary hyperparathyroidism of renal origin: Secondary | ICD-10-CM

## 2018-01-12 DIAGNOSIS — N186 End stage renal disease: Secondary | ICD-10-CM

## 2018-01-12 DIAGNOSIS — E872 Acidosis: Secondary | ICD-10-CM

## 2018-01-12 DIAGNOSIS — R053 Chronic cough: Secondary | ICD-10-CM

## 2018-01-12 DIAGNOSIS — Z79899 Other long term (current) drug therapy: Secondary | ICD-10-CM

## 2018-01-12 DIAGNOSIS — R197 Diarrhea, unspecified: Secondary | ICD-10-CM

## 2018-01-12 DIAGNOSIS — E878 Other disorders of electrolyte and fluid balance, not elsewhere classified: Secondary | ICD-10-CM

## 2018-01-12 DIAGNOSIS — K219 Gastro-esophageal reflux disease without esophagitis: Secondary | ICD-10-CM

## 2018-01-12 DIAGNOSIS — A084 Viral intestinal infection, unspecified: Secondary | ICD-10-CM | POA: Diagnosis not present

## 2018-01-12 DIAGNOSIS — R918 Other nonspecific abnormal finding of lung field: Secondary | ICD-10-CM

## 2018-01-12 DIAGNOSIS — Z992 Dependence on renal dialysis: Secondary | ICD-10-CM

## 2018-01-12 DIAGNOSIS — I350 Nonrheumatic aortic (valve) stenosis: Secondary | ICD-10-CM

## 2018-01-12 DIAGNOSIS — E875 Hyperkalemia: Secondary | ICD-10-CM

## 2018-01-12 DIAGNOSIS — I440 Atrioventricular block, first degree: Secondary | ICD-10-CM

## 2018-01-12 DIAGNOSIS — R011 Cardiac murmur, unspecified: Secondary | ICD-10-CM

## 2018-01-12 DIAGNOSIS — I5032 Chronic diastolic (congestive) heart failure: Secondary | ICD-10-CM

## 2018-01-12 DIAGNOSIS — E43 Unspecified severe protein-calorie malnutrition: Secondary | ICD-10-CM

## 2018-01-12 DIAGNOSIS — R05 Cough: Secondary | ICD-10-CM

## 2018-01-12 LAB — RENAL FUNCTION PANEL
Albumin: 2.7 g/dL — ABNORMAL LOW (ref 3.5–5.0)
Anion gap: 18 — ABNORMAL HIGH (ref 5–15)
BUN: 77 mg/dL — AB (ref 8–23)
CALCIUM: 8.5 mg/dL — AB (ref 8.9–10.3)
CHLORIDE: 101 mmol/L (ref 98–111)
CO2: 18 mmol/L — ABNORMAL LOW (ref 22–32)
CREATININE: 13.08 mg/dL — AB (ref 0.44–1.00)
GFR calc non Af Amer: 2 mL/min — ABNORMAL LOW (ref >60)
GFR, EST AFRICAN AMERICAN: 3 mL/min — AB (ref >60)
Glucose, Bld: 74 mg/dL (ref 70–99)
Phosphorus: 11.6 mg/dL — ABNORMAL HIGH (ref 2.5–4.6)
Potassium: 5.2 mmol/L — ABNORMAL HIGH (ref 3.5–5.1)
SODIUM: 137 mmol/L (ref 135–145)

## 2018-01-12 LAB — CBC
HCT: 30.3 % — ABNORMAL LOW (ref 36.0–46.0)
Hemoglobin: 9.4 g/dL — ABNORMAL LOW (ref 12.0–15.0)
MCH: 29.9 pg (ref 26.0–34.0)
MCHC: 31 g/dL (ref 30.0–36.0)
MCV: 96.5 fL (ref 78.0–100.0)
PLATELETS: 167 10*3/uL (ref 150–400)
RBC: 3.14 MIL/uL — ABNORMAL LOW (ref 3.87–5.11)
RDW: 14.7 % (ref 11.5–15.5)
WBC: 3.7 10*3/uL — AB (ref 4.0–10.5)

## 2018-01-12 MED ORDER — DARBEPOETIN ALFA 100 MCG/0.5ML IJ SOSY
PREFILLED_SYRINGE | INTRAMUSCULAR | Status: AC
  Filled 2018-01-12: qty 0.5

## 2018-01-12 MED ORDER — RENA-VITE PO TABS
1.0000 | ORAL_TABLET | Freq: Every day | ORAL | Status: DC
  Administered 2018-01-12: 1 via ORAL
  Filled 2018-01-12: qty 1

## 2018-01-12 MED ORDER — PRO-STAT SUGAR FREE PO LIQD
30.0000 mL | Freq: Two times a day (BID) | ORAL | Status: DC
  Administered 2018-01-12 – 2018-01-13 (×2): 30 mL via ORAL
  Filled 2018-01-12 (×2): qty 30

## 2018-01-12 MED ORDER — NEPRO/CARBSTEADY PO LIQD
237.0000 mL | Freq: Two times a day (BID) | ORAL | Status: DC
  Filled 2018-01-12 (×3): qty 237

## 2018-01-12 MED ORDER — PANTOPRAZOLE SODIUM 40 MG PO TBEC
40.0000 mg | DELAYED_RELEASE_TABLET | Freq: Every day | ORAL | Status: DC
  Administered 2018-01-13: 40 mg via ORAL
  Filled 2018-01-12: qty 1

## 2018-01-12 MED ORDER — CALCIUM ACETATE (PHOS BINDER) 667 MG PO CAPS
1334.0000 mg | ORAL_CAPSULE | Freq: Three times a day (TID) | ORAL | Status: DC
  Filled 2018-01-12 (×2): qty 2

## 2018-01-12 MED ORDER — PANTOPRAZOLE SODIUM 20 MG PO TBEC
20.0000 mg | DELAYED_RELEASE_TABLET | Freq: Every day | ORAL | Status: DC
  Administered 2018-01-12: 20 mg via ORAL
  Filled 2018-01-12: qty 1

## 2018-01-12 NOTE — Progress Notes (Signed)
   Subjective: Pt states she feels "rough" during dialysis today. She endorses headache and LLQ / RLQ abdominal pain that is similar to her pain from yesterday. She endorses continued diarrhea overnight. Denies nausea / vomiting. Endorses continued cough. She states her HA and abdominal pain started prior to starting HD this morning.  Objective:  Vital signs in last 24 hours: Vitals:   01/12/18 0930 01/12/18 1000 01/12/18 1030 01/12/18 1052  BP: (!) 154/81 (!) 150/75 (!) 159/75 (!) 158/71  Pulse: 95 96 94 98  Resp:    16  Temp:    98.4 F (36.9 C)  TempSrc:    Oral  SpO2:    98%  Weight:    62.1 kg (136 lb 14.5 oz)  Height:       Physical Exam: Gen: NAD, well appearing Cardio: RRR, S1, S2,  3/6 crescendo - decrescendo systolic murmur  Resp: LCAB, no increased WOB Abdominal: soft, no rebound, no guarding, diffuse mild pain at LLQ, RLQ +BS Extremities: warm, no LE edema bilaterally, improved from yesterday Neuro: A&O x 3, normal speech   Assessment/Plan: Ms. Slutsky is a 77 yo female with hx of ESRD on M/W/F HD, aortic stenosis with calcific vegetative mass, dialysis dysequilibrium syndrome resulting in multiple recent hospitalizations,  anemia, secondary hyperparathyroidism 2/2 renal disease, HFpEF, HTN, diverticulitis, GERD, and benign pulmonary nodules with multiple recent hospitalizations for intractable nausea and vomiting with HD who presents today with 6 day hx of diarrhea and missing 2 HD sessions, now with new finding of RUL pulmonary nodule on CXR.  #Diarrhea -DDx: leading dx is viral gastroenteritis although 7 days seems like quite a protracted course for a viral etiology. Also considering bacterial gastroenteritis, and parasitic causes. C diff is unlikely with no reported recent abx use. -s/p 559mL NS in ED -Stool studies pending - GI panel, O&P, C Diff Plan: -avoid maintenance fluids in setting of ESRD -Zofran PRN nausea -Tylenol PRN pain  #ESRD -scheduled M/W/F  dialysis -last dialysis 7/1 -not grossly volume overloaded on PE Plan: -nephrology consulted -HD this morning 7/8 -continue Velphoro  #RUL pulmonary nodule 1.5 x 1.2 -hx  RLL benign pulmonary nodules 2008 -confirmed with radiologist Dr. Vella Redhead, this nodule was not visualized on previous CXR 12/15/17 -concern for malignancy, pt denies smoking hx Plan: -CT chest pending  #Hyperkalemia -5.4 on admission, now improved 5.2 -s/p Kayexalate Plan: -FU BMP  -continue regularly scheduled HD  #Anion gap metabolic acidosis 2/2 uremia -no encephalopathy  Plan: -recheck BMP after HD  #Secondary hyperparathyroidism Plan:  Continue calcitriol  #HTN Plan: -continue home amlodipine 5mg  po qd  #Anemia -hgb stable Plan: -received Epo at HD today 01/12/18, continue qMonday at HD  #DVT Prophylaxis -heparin   #Dispo: Inpatient admission due to diarrhea resulting in missing HD sessions, now with incidental finding of mulmonary nodule, expected length of stay 24-48hrs.  Makarios Madlock, Earnest Conroy, Medical Student 01/12/2018, 11:48 AM  Pager [phone]

## 2018-01-12 NOTE — H&P (Signed)
Internal Medicine Attending Admission Note Date: 01/12/2018  Patient name: Nancy [name] Medical record number: [id number] Date of birth: December 14, 1940 Age: 77 y.o. Gender: female  I saw and evaluated the patient. I reviewed the resident's note and I agree with the resident's findings and plan as documented in the resident's note.  Chief Complaint(s): nausea, vomiting, and diarrhea.  History - key components related to admission:  Nancy [name] is a 77 year old woman with a history of dialysis disequilibrium syndrome, aortic stenosis, chronic diastolic heart failure, essential hypertension, gastroesophageal reflux disease, and pulmonary nodules who was in her usual state of health until 5 days prior to admission when she had recurrent emesis. After taking an anti-emetic her symptoms improved. On the day prior to admission she developed diarrhea associated with crampy lower abdominal pain. Of note, she missed her last 2 hemodialysis sessions because she was not feeling well. As her symptoms progressed she presented to the emergency department for further evaluation.  She was found to have a significant metabolic acidosis likely related to her missed dialysis sessions and she was admitted to the internal medicine teaching service for further evaluation and care.  Physical Exam - key components related to admission:  Vitals:   01/12/18 0930 01/12/18 1000 01/12/18 1030 01/12/18 1052  BP: (!) 154/81 (!) 150/75 (!) 159/75 (!) 158/71  Pulse: 95 96 94 98  Resp:    16  Temp:    98.4 F (36.9 C)  TempSrc:    Oral  SpO2:    98%  Weight:    136 lb 14.5 oz (62.1 kg)  Height:       Gen.: Well-developed, well-nourished, woman lying comfortably on a stretcher undergoing hemodialysis in no acute distress. Lungs: Clear to auscultation bilaterally anteriorly. Heart: Regular rate and rhythm with a 2/6 crescendo decrescendo murmur best heard at the left upper sternal border. Abdomen: Soft, with mild tenderness in  the bilateral lower quadrants to deep palpation, no guarding or rebound. Extremities: Without edema.  Lab results:  Basic Metabolic Panel: Recent Labs    01/11/18 1026 01/12/18 0815  NA 136 137  K 5.4* 5.2*  CL 97* 101  CO2 20* 18*  GLUCOSE 76 74  BUN 74* 77*  CREATININE 12.48* 13.08*  CALCIUM 8.9 8.5*  PHOS  --  11.6*   Liver Function Tests: Recent Labs    01/11/18 1026 01/12/18 0815  AST 10*  --   ALT 8  --   ALKPHOS 39  --   BILITOT 0.7  --   PROT 6.4*  --   ALBUMIN 3.0* 2.7*   CBC: Recent Labs    01/11/18 1026 01/12/18 0815  WBC 3.7* 3.7*  NEUTROABS 2.6  --   HGB 10.4* 9.4*  HCT 33.5* 30.3*  MCV 98.8 96.5  PLT 168 167   Misc. Labs:  Urine sodium 80  Imaging results:   Chest x-ray: Personally reviewed. Right upper lung field nodule which I believe was present on the chest x-ray from 12/15/2017 although the current lesion is slightly larger.  Other results:  EKG: personally reviewed. Normal sinus rhythm at 88 bpm, normal axis, first-degree AV block, no significant Q waves, no LVH by voltage, good R wave progression, no ST segment changes, inferolateral T wave inversions in a strain pattern. Unchanged from the previous ECG from 12/16/2017.  Assessment & Plan by Problem:  Nancy [name] is a 77 year old woman with a history of dialysis disequilibrium syndrome, aortic stenosis, chronic diastolic heart failure, essential hypertension, gastroesophageal reflux  disease, and pulmonary nodules who presents with nausea and vomiting and subsequent diarrhea. Her diarrhea is likely related to a viral gastroenteritis. The nausea and vomiting can also be contributed to by a viral gastroenteritis, although the missed dialysis sessions may have also resulted in a mild uremia contributing to her nausea and vomiting. Therefore, I suspect that her nausea and vomiting should improve after hemodialysis and correction of her metabolic acidosis. She has a chronic cough which she has  been complaining about for several months. My best guess is this is related to gastroesophageal reflux disease that has been untreated. She also has a pulmonary nodule which I do not believe is new, but is slightly enlarged compared to the previous chest x-ray a month ago. Some of this change in size could be related to the fact that the present x-ray is less lordotic than the previous x-ray. I suspect with compliance with her hemodialysis and with time to allow resolution of her gastroenteritis she should return to her baseline.  1) Nausea, vomiting, and diarrhea. We will provide her with anti-emetics and check stool studies to rule out an infectious cause. She is undergoing hemodialysis which should help with some of the nausea and vomiting.  2) Chronic cough: She would benefit from a 3 month trial of high-dose PPI therapy. As she does not like to take medications I am concerned she may not follow through with this treatment. Therefore, I will defer to her primary care provider the option of explaining the importance of twice a day PPI therapy for her reflux disease in order to manage her chronic cough.  3) Pulmonary nodules: We are obtaining a CT scan without contrast to assess for any changes in the size of her pulmonary nodules. If there are changes in size we will then need to discuss options necessary to better characterize the lesions. This workup can be done as an outpatient.  4) Disposition: I anticipate she will be stable for discharge home after her hemodialysis session assuming that she can complete her CT scan of the thorax. Further follow-up can occur in the [facility] Medicine Center with her primary care provider. The stool studies will be followed up by the inpatient team.

## 2018-01-12 NOTE — Discharge Summary (Addendum)
Name: Nancy [name] MRN: [id number] DOB: Sep 27, 1940 77 y.o. PCP: Aldine Contes, MD  Date of Admission: 01/11/2018  7:43 AM Date of Discharge: 01/13/18 Attending Physician: Oval Linsey, MD  Discharge Diagnosis: 1. Viral gastroenteritis 2. ESRD 3. Chronic Cough 4. Pulmonary Nodules   Discharge Medications: Allergies as of 01/13/2018      Reactions   Hydrocodone Nausea And Vomiting, Other (See Comments)   dizziness      Medication List    TAKE these medications   acetaminophen 500 MG tablet Commonly known as:  TYLENOL Take 2 tablets (1,000 mg total) by mouth every 8 (eight) hours as needed for mild pain, moderate pain, fever or headache.   amLODipine 5 MG tablet Commonly known as:  NORVASC Take 1 tablet (5 mg total) by mouth daily. Start taking on:  01/14/2018   aspirin 81 MG EC tablet Take 1 tablet (81 mg total) by mouth daily.   calcitRIOL 0.5 MCG capsule Commonly known as:  ROCALTROL Take 4 capsules (2 mcg total) by mouth every Monday, Wednesday, and Friday with hemodialysis.   calcium acetate 667 MG capsule Commonly known as:  PHOSLO Take 2 capsules (1,334 mg total) by mouth 3 (three) times daily with meals.   dextromethorphan-guaiFENesin 30-600 MG 12hr tablet Commonly known as:  MUCINEX DM Take 1 tablet by mouth 2 (two) times daily as needed for up to 30 doses for cough.   loperamide 2 MG tablet Commonly known as:  IMODIUM A-D Take 1 tablet (2 mg total) by mouth 4 (four) times daily as needed for diarrhea or loose stools.   multivitamin Tabs tablet Take 1 tablet by mouth at bedtime.   nitroGLYCERIN 0.3 MG SL tablet Commonly known as:  NITROSTAT Place 1 tablet (0.3 mg total) under the tongue every 5 (five) minutes as needed for chest pain.   pantoprazole 40 MG tablet Commonly known as:  PROTONIX Take 1 tablet (40 mg total) by mouth daily. Start taking on:  01/14/2018   promethazine 12.5 MG tablet Commonly known as:  PHENERGAN Take 1 tablet  (12.5 mg total) by mouth every 6 (six) hours as needed for nausea or vomiting (Take 1 prior to dialysis).   VELPHORO 500 MG chewable tablet Generic drug:  sucroferric oxyhydroxide Chew 500 mg by mouth 3 (three) times daily with meals.       Disposition and follow-up:   [name] was discharged from [facility] in stable condition.  At the hospital follow up visit please address:  1.  Please encourage compliance to hemodialysis. Please encourage compliance to 12 week trial of Protonix for chronic cough associated with GERD.  2.  Labs / imaging needed at time of follow-up: N/A  3.  Pending labs/ test needing follow-up: Stool GI panel, O&P, and culture to exclude bacterial / parasitic causes of diarrhea.   Follow-up Appointments: Follow-up Information    [place]. Go in 1 week(s).   Why:  Please come to your hospital follow up appointment on Tuesday June 16 at 10:15am.  Contact information: [address] [place] [phone]        Tuesday June 16 10:15am at the Internal Medicine Center.   Hospital Course by problem list: 1. Viral gastroenteritis Pt presented with 6 day hx of watery, non-bloody diarrhea associated with RLQ and LLQ abdominal pain and anorexia most likely due to viral gastroenteritis. Pt was not grossly volume depleted, or overloaded. She denied recent abx use  making C. Diff less likely. Diarrhea acutely worsened after administration of kayexalate in the ED, but has subsequently slowed throughout hospitalization. Because of diarrhea lasting >7d, stool studies including GI panel, stool O&P, and stool culture are pending to rule out bacterial and parasitic causes that would require additional treatment.  2. ESRD Because of diarrhea, pt missed 2 sessions of HD. Last session prior to hospitalization was Monday 7/1, then pt missed 7/3 and 7/5 before presenting to the ED on 7/7. Initial labs  were significant for mild hyperkalemia without EKG changes, for which, pt received kayexalate with repeat K downtrending. Labs also significant for a anion gap (19) metabolic acidosis 2/2 uremia with initial BUN of 74, and Cr of 12.4 . Nephrology was consulted and pt received HD on Monday 7/8. Pt tolerated HD well without experiencing symptoms of dialysis disequilibrium syndrome.  3. Chronic Cough Pt endorses chronic dry cough, worse at night associated with odynophagia and L sided chest pain she states is consistent with her reported hx of hiatal hernia. Pt denies lifetime smoking history. CXR obtained showed nodules as explained below, with no acute intrapulmonary process to explain the cough. Cough is most likely associated with GERD, therefore we will start a 12 week trial of high dose pantoprazole with instructions to follow up with her PCP.  4. Pulmonary Nodule Pt endorsed chronic night cough which prompted CXR with findings significant for RUL pulmonary nodule 1.5 x 1.2cm that was not present on previous CXR 12/15/17. CT obtained to further assess showed the RUL nodule was a "tortuous vascular structure" and not a true nodule requiring additional surveillance. The CT did show multiple small nodules in the left lung, which upon our further review of chest CT from 2007, were present at that time and showed minimal to no growth. Pt denies smoking hx. We believe these nodules are chronic and benign and do not recommend further diagnostic follow up at this time.   Discharge Vitals:   BP (!) 158/69 (BP Location: Right Arm) Comment: RN was notifyed   Pulse (!) 102   Temp 98.5 F (36.9 C) (Oral)   Resp 18   Ht 5\' 3"  (1.6 m)   Wt 62.1 kg (136 lb 14.5 oz)   SpO2 100%   BMI 24.25 kg/m   Pertinent Labs, Studies, and Procedures:  Labs K 5.4 --> 3.6 on DC Cr 12.5 --> 6.56 BUN: 74 --> 26 HCO3: 20 --> 25 Anion Gap: 19 --> 14  Imaging:  CXR 01/11/18: 1.5 x 1.2cm nodular opacity in the medial aspect  of the right upper lobe. No adenopathy. Advise non-contrast CT to further assess. Aortic atherosclerosis is present.  CT Chest 01/12/18: Apparent RUL nodule initially viewed on CXR is consistent with tortuous vascular structures. Multiple small pulmonary nodules in the left lunglargest measuring 40mm with minimal or no growth when compared to Chest CT in 2007.  Discharge Instructions: Discharge Instructions    Call MD for:  persistant dizziness or light-headedness   Complete by:  As directed    Call MD for:  persistant nausea and vomiting   Complete by:  As directed    Call MD for:  temperature >100.4   Complete by:  As directed    Diet - low sodium heart healthy   Complete by:  As directed    Discharge instructions   Complete by:  As directed    You were hospitalized for diarrhea which caused you to miss dialysis appointments. We believe you had a virus  that was causing the diarrhea, and we are also running tests on the stool sample. We will give you your results at your follow up appointment at the Internal Medicine Center next Tuesday June 16.  We believe your cough is caused by reflux. We will start a new medicine called Protonix that you will take 1 time in the morning for 12 weeks. This medicine takes time to work, so please keep taking it, even if your cough does not get better at first.   Please take imodium as needed for diarrhea.   Please continue going to your hemodialysis appointments on Mondays, Wednesdays, and Fridays.   Increase activity slowly   Complete by:  As directed     Please follow up with your doctor at the [facility] on Tuesday July 16th at 10:15am.   Signed: Christin Bach, Medical Student 01/13/2018, 2:09 PM   Pager: [phone]  Attestation for Student Documentation:  I personally was present and performed or re-performed the history, physical exam and medical decision-making activities of this service and have verified that the service and  findings are accurately documented in the student's note.  [name], MD 01/13/2018, 9:46 PM

## 2018-01-12 NOTE — Progress Notes (Signed)
Patient pulled her IV out, and refused to have another one.   Patient refusing Cardiac monitoring.  Pt is also refusing to take Phoslo and Velphoro and said " you are over medicating me".  Will continue to monitor.

## 2018-01-12 NOTE — Progress Notes (Signed)
[facility] Progress Note   Subjective:  Seen on HD. Signing off early after dialyzer clotted.  Diarrhea improving, but no appetite   Objective Vitals:   01/12/18 0900 01/12/18 0930 01/12/18 1000 01/12/18 1030  BP: (!) 148/72 (!) 154/81 (!) 150/75 (!) 159/75  Pulse: 94 95 96 94  Resp:      Temp:      TempSrc:      SpO2:      Weight:      Height:       Physical Exam General: elderly female NAD  Heart: RRR 3/6 systolic murmur Lungs: CTAB  Abdomen: normal BS, mild tenderness to palpation lower abd Extremities: no LE edema  Dialysis Access: LUE AVF cannulated on HD   Dialysis Orders:  [facility] MWF 4 hr 300/800 EDW 58 2 K 2.5 Ca  AVF -left upper heparin 3000 Venofer 100 through 7/24 - 10 doses not started yet  Mircera 100 last given 6/19 - missed redose  Last AF 300 7/1 (normal F'gram April - AF had been 250 at that time and 2000 for recheck)  Assessment/Plan: 1. Diarrhea - improving. W/u per primary. Missed last 2 outpatient dialysis sessions because of diarrhea.  2. ESRD - MWF. Hyperkalemia/acidosis on admission 2/2 missed treatments.  Will correct with HD today.  3. Anemia -  Hgb 9.4. Aranesp 100 dosed today. IV Fe resumed.  4. MBD-  Phos elevated.^ Ca acetate binder  5. HTN/volume - BP high- Home meds  just amlodipine 5mg . EDW recently raised 1kg due to hypotension. 4kg over EDW by weights here. Push to goal next HD.  6. Nutrition - Renal diet/vitamins   Lynnda Child PA-C [facility] Pager [phone] 01/12/2018,11:40 AM  LOS: 0 days   Additional Objective Labs: Basic Metabolic Panel: Recent Labs  Lab 01/11/18 1026 01/12/18 0815  NA 136 137  K 5.4* 5.2*  CL 97* 101  CO2 20* 18*  GLUCOSE 76 74  BUN 74* 77*  CREATININE 12.48* 13.08*  CALCIUM 8.9 8.5*  PHOS  --  11.6*   CBC: Recent Labs  Lab 01/11/18 1026 01/12/18 0815  WBC 3.7* 3.7*  NEUTROABS 2.6  --   HGB 10.4* 9.4*  HCT 33.5* 30.3*  MCV 98.8 96.5  PLT 168 167    Blood Culture    Component Value Date/Time   SDES BLOOD RIGHT WRIST 10/24/2017 0811   SPECREQUEST  10/24/2017 0811    BOTTLES DRAWN AEROBIC AND ANAEROBIC Blood Culture results may not be optimal due to an inadequate volume of blood received in culture bottles   CULT  10/24/2017 0811    NO GROWTH 5 DAYS Performed at [facility], [place] [address] [place], [place] [address]    REPTSTATUS 10/29/2017 FINAL 10/24/2017 0811    Cardiac Enzymes: No results for input(s): CKTOTAL, CKMB, CKMBINDEX, TROPONINI in the last 168 hours. CBG: No results for input(s): GLUCAP in the last 168 hours. Iron Studies: No results for input(s): IRON, TIBC, TRANSFERRIN, FERRITIN in the last 72 hours. Lab Results  Component Value Date   INR 1.30 02/17/2017   INR 1.14 09/21/2016   INR 1.06 07/13/2012   Medications: . ferric gluconate (FERRLECIT/NULECIT) IV 125 mg (01/12/18 1035)   . amLODipine  5 mg Oral Daily  . calcitRIOL  2 mcg Oral Q M,W,F-HD  . Chlorhexidine Gluconate Cloth  6 each Topical Q0600  . Darbepoetin Alfa      . darbepoetin (ARANESP) injection - DIALYSIS  100 mcg Intravenous  Q Mon-HD  . heparin  5,000 Units Subcutaneous Q8H  . sodium chloride flush  3 mL Intravenous Q12H  . sucroferric oxyhydroxide  500 mg Oral TID WC

## 2018-01-12 NOTE — Progress Notes (Signed)
Initial Nutrition Assessment  DOCUMENTATION CODES:   Severe malnutrition in context of chronic illness  INTERVENTION:   -Add Renal MVI  -Add Nepro Shake po BID, each supplement provides 425 kcal and 19 grams protein  -Add Pro-Stat 30 mL BID  -Provided written and verbal education on low phosphorus diet.    NUTRITION DIAGNOSIS:   Severe Malnutrition related to chronic illness(ESRD on HD) as evidenced by energy intake < or equal to 75% for > or equal to 1 month, severe muscle depletion.  GOAL:   Patient will meet greater than or equal to 90% of their needs  MONITOR:   PO intake, Supplement acceptance, Labs, Weight trends  REASON FOR ASSESSMENT:   Malnutrition Screening Tool    ASSESSMENT:   77 yo female admitted with diarrhea, mild hyperkalemia with 2 missed HD sessions. Noted incidental finding of pulmonary nodule concerning for malignancy. Pt with hx of CHF, ESRD on HD, colitis, CVA, HTN   Pt reports appetite is fair; she eats 1 meal on dialysis days, 2 meals on non-dialysis days. Pt denies snacks or supplements. Pt reports she eats Breakfast and Dinner but just Breakfast on dialysis days. Breakfast is eggs and bacon, sometimes with frosted flakes. Pt reports dinner is fried chicken or steak and potato.  Pt reports all she has eaten this admission was graham crackers up until lunch today. Pt ate most of the pot roast, most of pudding, some of the rice and gravy at lunch today. Pt reports she lives alone, does her own cooking and grocery shopping.   Pt initially denied weight loss but then indicates that she used to weigh 255 pounds (116 kg) when her "kidney problems" began; she reports this was in 2009. Pt reports she does not know her dry weight. Per MD notes, EDW 58 kg. Current weight 62 kg.   Nutrition focused physical exam complete; pt with significant muscle depletions and lots of loose excess skin indicating weight loss but no subcutaenous fat depletions.   Labs:  phosphorus 11.6 (H), potassium 5.2 (acceptable for HD), corrected calcium 9.5, albumin 2.7 Meds: velphoro, calcitriol, Phoslo, ferric gluconate  NUTRITION - FOCUSED PHYSICAL EXAM:    Most Recent Value  Orbital Region  No depletion  Upper Arm Region  No depletion  Thoracic and Lumbar Region  No depletion  Buccal Region  No depletion  Temple Region  Moderate depletion  Clavicle Bone Region  Moderate depletion  Clavicle and Acromion Bone Region  Moderate depletion  Scapular Bone Region  Severe depletion  Dorsal Hand  Mild depletion  Patellar Region  Severe depletion  Anterior Thigh Region  Severe depletion  Posterior Calf Region  Severe depletion  Edema (RD Assessment)  None       Diet Order:   Diet Order           Diet renal with fluid restriction Fluid restriction: 1200 mL Fluid; Room service appropriate? Yes; Fluid consistency: Thin  Diet effective now          EDUCATION NEEDS:   Education needs have been addressed  Skin:  Skin Assessment: Reviewed RN Assessment  Last BM:  7/7  Height:   Ht Readings from Last 1 Encounters:  01/11/18 5\' 3"  (1.6 m)    Weight:   Wt Readings from Last 1 Encounters:  01/12/18 136 lb 14.5 oz (62.1 kg)    Ideal Body Weight:  52.3 kg  BMI:  Body mass index is 24.25 kg/m.  Estimated Nutritional Needs:   Kcal:  1800-2000 kcals   Protein:  90-100 g   Fluid:  1000 mL plus UOP   [employer] MS, RD, LDN, CNSC [phone] Pager  [phone] Weekend/On-Call Pager

## 2018-01-12 NOTE — Progress Notes (Signed)
Patient refusing to wear telemetry at this time.

## 2018-01-12 NOTE — Patient Outreach (Signed)
[place] [facility]) Care Management  01/12/2018  [place] B Mozer 02-Aug-1940 [id number]   Reviewed patient's chart prior to calling her and discovered that she is currently hospitalized with nausea, diarrhea and protracted vomiting.     Plan: Follow up with patient's post discharge.   Elayne Guerin, PharmD, [place] Clinical Pharmacist [phone]

## 2018-01-13 ENCOUNTER — Encounter (HOSPITAL_COMMUNITY): Payer: Self-pay | Admitting: *Deleted

## 2018-01-13 DIAGNOSIS — I132 Hypertensive heart and chronic kidney disease with heart failure and with stage 5 chronic kidney disease, or end stage renal disease: Secondary | ICD-10-CM | POA: Diagnosis not present

## 2018-01-13 DIAGNOSIS — M25561 Pain in right knee: Secondary | ICD-10-CM | POA: Diagnosis not present

## 2018-01-13 DIAGNOSIS — A084 Viral intestinal infection, unspecified: Secondary | ICD-10-CM | POA: Diagnosis not present

## 2018-01-13 DIAGNOSIS — N186 End stage renal disease: Secondary | ICD-10-CM | POA: Diagnosis not present

## 2018-01-13 DIAGNOSIS — Z885 Allergy status to narcotic agent status: Secondary | ICD-10-CM

## 2018-01-13 DIAGNOSIS — R918 Other nonspecific abnormal finding of lung field: Secondary | ICD-10-CM | POA: Diagnosis not present

## 2018-01-13 LAB — RENAL FUNCTION PANEL
ALBUMIN: 2.6 g/dL — AB (ref 3.5–5.0)
Anion gap: 14 (ref 5–15)
BUN: 26 mg/dL — ABNORMAL HIGH (ref 8–23)
CALCIUM: 8.7 mg/dL — AB (ref 8.9–10.3)
CO2: 25 mmol/L (ref 22–32)
Chloride: 98 mmol/L (ref 98–111)
Creatinine, Ser: 6.56 mg/dL — ABNORMAL HIGH (ref 0.44–1.00)
GFR calc Af Amer: 6 mL/min — ABNORMAL LOW (ref >60)
GFR, EST NON AFRICAN AMERICAN: 6 mL/min — AB (ref >60)
Glucose, Bld: 98 mg/dL (ref 70–99)
PHOSPHORUS: 6.6 mg/dL — AB (ref 2.5–4.6)
POTASSIUM: 3.6 mmol/L (ref 3.5–5.1)
SODIUM: 137 mmol/L (ref 135–145)

## 2018-01-13 LAB — GLUCOSE, CAPILLARY: Glucose-Capillary: 86 mg/dL (ref 70–99)

## 2018-01-13 MED ORDER — LOPERAMIDE HCL 2 MG PO TABS: 2.0000 mg | ORAL_TABLET | Freq: Four times a day (QID) | ORAL | 0 refills | Status: DC | PRN

## 2018-01-13 MED ORDER — DM-GUAIFENESIN ER 30-600 MG PO TB12: 1.0000 | ORAL_TABLET | Freq: Two times a day (BID) | ORAL | 0 refills | Status: DC | PRN

## 2018-01-13 MED ORDER — DM-GUAIFENESIN ER 30-600 MG PO TB12: 1.0000 | ORAL_TABLET | Freq: Two times a day (BID) | ORAL | Status: DC | PRN

## 2018-01-13 MED ORDER — RENA-VITE PO TABS: 1.0000 | ORAL_TABLET | Freq: Every day | ORAL | 0 refills | Status: AC

## 2018-01-13 MED ORDER — CALCIUM ACETATE (PHOS BINDER) 667 MG PO CAPS: 1334.0000 mg | ORAL_CAPSULE | Freq: Three times a day (TID) | ORAL | 0 refills | Status: DC

## 2018-01-13 MED ORDER — AMLODIPINE BESYLATE 5 MG PO TABS: 5.0000 mg | ORAL_TABLET | Freq: Every day | ORAL | 0 refills | Status: DC

## 2018-01-13 MED ORDER — PANTOPRAZOLE SODIUM 40 MG PO TBEC: 40.0000 mg | DELAYED_RELEASE_TABLET | Freq: Every day | ORAL | 0 refills | Status: DC

## 2018-01-13 MED ORDER — SUCROFERRIC OXYHYDROXIDE 500 MG PO CHEW
500.0000 mg | CHEWABLE_TABLET | Freq: Three times a day (TID) | ORAL | Status: DC
  Filled 2018-01-13 (×2): qty 1

## 2018-01-13 NOTE — Progress Notes (Signed)
   Subjective: Pt endorses feeling well this morning although she had just had diarrhea before we came in the room. Stool sample was collected but not yet sent to lab. Pt denies nausea, vomiting, and abdominal pain. She continues to endorse cough and unfortunately did not receive Mucinex that was prescribed overnight. She endorses R knee pain which has required fluid drainage previously. Pt is asking to go home.  Objective:  Vital signs in last 24 hours: Vitals:   01/12/18 1636 01/12/18 2130 01/13/18 0607 01/13/18 0838  BP: (!) 154/57 127/67 132/72 (!) 158/69  Pulse: (!) 105 (!) 102 97 (!) 102  Resp:  18 17 18   Temp: 47.4 F (37.3 C) 98.7 F (37.1 C) 98.3 F (36.8 C) 98.5 F (36.9 C)  TempSrc: Oral Oral Oral Oral  SpO2: 100% 94% 95% 100%  Weight:  62.1 kg (136 lb 14.5 oz)    Height:       Physical Exam: Gen:NAD, well appearing, sitting up in bed Cardio: RRR, S1, S2,3/6 crescendo - decrescendo systolic murmur Resp: LCAB, no increased WOB Abdominal: soft,no rebound, no guarding, non-tender throughout Extremities:warm, R knee with mild edema compared to R knee Neuro: A&O x 3, normal speech    Assessment/Plan: Ms. Blue is a 77 yo female with hx of ESRD on M/W/F HD, aortic stenosis with calcific vegetative mass, dialysis dysequilibrium syndrome resulting in multiple recent hospitalizations,  anemia, secondary hyperparathyroidism 2/2 renal disease, HFpEF, HTN, diverticulitis, GERD, and benign pulmonary nodules with multiple recent hospitalizations for intractable nausea and vomitingwith HDwho presents with6 day hx ofdiarrhea and missing 2 HD sessions, with chronic cough.  #Diarrhea -DDx: leading dx is viral gastroenteritis although 8 days seems like quite a protracted course for a viral etiology. Also considering bacterial gastroenteritis, and parasitic causes. C diff is unlikely with no reported recent abx use. Plan: -Stool studies pending - GI panel, O&P, C Diff, will FU  at outpatient visit -Zofran PRN nausea -Tylenol PRN pain -discharge with imodium PRN diarrhea  #ESRD -scheduled M/W/F dialysis -last dialysis 7/8 -not grossly volume overloaded on PE Plan: -nephrology consulted -continue MWF dialysis -continue renal vitamins per nephrology  #Chronic Cough -worse at night, associated with GERD sxs -CXR unremarkable for acute process Plan: -12 week trial PPi for GERD associated cough  #Pulmonary Nodules -CXR: new RUL pulmonary nodule 1.5 x 1.2 viewed -FU CT showed the RUL nodule was a "tortuous vascular structure" and not a true nodule  -CT with multiple small nodules in the LEFT lung, which upon our further review of chest CT from 2007, were present at that time and showed minimal to no growth -pt denies smoking hx Plan:  -no further diagnostic work up   #Hyperkalemia -5.4 on admission -s/p Kayexalate -K WNL following HD Plan: -continue regularly scheduled HD  #Anion gap metabolic acidosis 2/2 uremia -no encephalopathy  -resolved following HD 7/8  #Secondary hyperparathyroidism Plan:  Continue calcitriol  #HTN Plan: -continue home amlodipine 5mg  po qd  #Anemia -hgb stable Plan: -received Epo at HD 01/12/18, continue qMonday at HD  #DVT Prophylaxis -heparin   #Dispo: Inpatient admission due todiarrhea resulting in missing HD sessions,expect discharge today.    Nancy [name], Nancy [name], Medical Student 01/13/2018, 3:00 PM

## 2018-01-13 NOTE — Progress Notes (Signed)
Internal Medicine Attending  Date: 01/13/2018  Patient name: Nancy [name] Medical record number: [id number] Date of birth: 03-Apr-1941 Age: 77 y.o. Gender: female  I saw and evaluated the patient. I reviewed the sub-intern's note by Ms. Coffer and I agree with the findings and plans as documented in her progress note.  Please see my attestation statement attached to her progress note for the specifics of my assessment and plan from earlier today.

## 2018-01-13 NOTE — Progress Notes (Addendum)
[facility] Progress Note   Subjective:  Seen in room. Feels better today Abdominal fullness but no BM today Denies nausea/vomiting, ate breakfast   Objective Vitals:   01/12/18 1636 01/12/18 2130 01/13/18 0607 01/13/18 0838  BP: (!) 154/57 127/67 132/72 (!) 158/69  Pulse: (!) 105 (!) 102 97 (!) 102  Resp:  18 17 18   Temp: 99.1 F (37.3 C) 98.7 F (37.1 C) 98.3 F (36.8 C) 98.5 F (36.9 C)  TempSrc: Oral Oral Oral Oral  SpO2: 100% 94% 95% 100%  Weight:  62.1 kg (136 lb 14.5 oz)    Height:       Physical Exam General: elderly female NAD  Heart: RRR 3/6 systolic murmur Lungs: CTAB  Abdomen: normal BS, mild tenderness to palpation lower abd Extremities: no LE edema  Dialysis Access: LUE AVF +bruit  Dialysis Orders:  [place] MWF 4 hr 300/800 EDW 58 2 K 2.5 Ca  AVF -left upper heparin 3000 Venofer 100 through 7/24 - 10 doses not started yet  Mircera 100 last given 6/19 - missed redose  Last AF 300 7/1 (normal F'gram April - AF had been 250 at that time and 2000 for recheck)  Assessment/Plan: 1. Diarrhea with N/V- improving. W/u per primary. Missed last 2 outpatient dialysis sessions because of diarrhea. Labs better after HD ?uremic symptoms 2/2 missing treatments.  2. ESRD - MWF.  Continue on schedule. Next HD 7/10  3. Anemia -  Hgb 9.4. Aranesp 100 dosed 7/8. IV Fe resumed.  4. MBD-  Phos elevated. Ca acetate/Velphoro binder here  5. HTN/volume - BP high- Home meds  just amlodipine 5mg . EDW recently raised 1kg due to hypotension. 4kg over EDW by weights here. Push to goal next HD.  6. Nutrition - Renal diet/vitamins   Lynnda Child PA-C [facility] Pager [phone] 01/13/2018,11:16 AM  LOS: 0 days   Pt seen, examined and agree w A/P as above.  Kelly Splinter MD [facility] pager [phone]   01/13/2018, 12:56 PM    Additional Objective Labs: Basic Metabolic Panel: Recent Labs  Lab 01/11/18 1026 01/12/18 0815  01/13/18 0503  NA 136 137 137  K 5.4* 5.2* 3.6  CL 97* 101 98  CO2 20* 18* 25  GLUCOSE 76 74 98  BUN 74* 77* 26*  CREATININE 12.48* 13.08* 6.56*  CALCIUM 8.9 8.5* 8.7*  PHOS  --  11.6* 6.6*   CBC: Recent Labs  Lab 01/11/18 1026 01/12/18 0815  WBC 3.7* 3.7*  NEUTROABS 2.6  --   HGB 10.4* 9.4*  HCT 33.5* 30.3*  MCV 98.8 96.5  PLT 168 167   Blood Culture    Component Value Date/Time   SDES BLOOD RIGHT WRIST 10/24/2017 0811   SPECREQUEST  10/24/2017 0811    BOTTLES DRAWN AEROBIC AND ANAEROBIC Blood Culture results may not be optimal due to an inadequate volume of blood received in culture bottles   CULT  10/24/2017 0811    NO GROWTH 5 DAYS Performed at [facility], [place] [address] [place], [place] [address]    REPTSTATUS 10/29/2017 FINAL 10/24/2017 0811    Cardiac Enzymes: No results for input(s): CKTOTAL, CKMB, CKMBINDEX, TROPONINI in the last 168 hours. CBG: Recent Labs  Lab 01/13/18 0733  GLUCAP 86   Iron Studies: No results for input(s): IRON, TIBC, TRANSFERRIN, FERRITIN in the last 72 hours. Lab Results  Component Value Date   INR 1.30 02/17/2017   INR 1.14 09/21/2016   INR 1.06  07/13/2012   Medications: . ferric gluconate (FERRLECIT/NULECIT) IV 125 mg (01/12/18 1035)   . amLODipine  5 mg Oral Daily  . calcitRIOL  2 mcg Oral Q M,W,F-HD  . calcium acetate  1,334 mg Oral TID WC  . Chlorhexidine Gluconate Cloth  6 each Topical Q0600  . darbepoetin (ARANESP) injection - DIALYSIS  100 mcg Intravenous Q Mon-HD  . feeding supplement (NEPRO CARB STEADY)  237 mL Oral BID BM  . feeding supplement (PRO-STAT SUGAR FREE 64)  30 mL Oral BID  . heparin  5,000 Units Subcutaneous Q8H  . multivitamin  1 tablet Oral QHS  . pantoprazole  40 mg Oral Daily  . sodium chloride flush  3 mL Intravenous Q12H

## 2018-01-13 NOTE — Consult Note (Signed)
   [facility] Inpatient Consult   01/13/2018  [place] B Ion June 03, 1941 [id number]   Patient active with [place] Management program services. She is followed by [place] and [facility] Pharmacist. Please see chart review tab then encounters for patient outreach details.   Spoke with Mrs. Resch just prior to her discharging. She states "they changed my medications all around". States " I am doing better now and they are letting me go". Made her aware that writer will touch base with [facility] RNCM and [facility] Pharmacist to make aware of her discharging today.   Marthenia Rolling, MSN-Ed, RN,BSN [facility] Liaison [phone]

## 2018-01-13 NOTE — Progress Notes (Signed)
Patient said she will not take her calcium or Velphoro.  She said she was taken off it and doesn't need it.

## 2018-01-14 DIAGNOSIS — D509 Iron deficiency anemia, unspecified: Secondary | ICD-10-CM | POA: Diagnosis not present

## 2018-01-14 DIAGNOSIS — R51 Headache: Secondary | ICD-10-CM | POA: Diagnosis not present

## 2018-01-14 DIAGNOSIS — N186 End stage renal disease: Secondary | ICD-10-CM | POA: Diagnosis not present

## 2018-01-14 DIAGNOSIS — D689 Coagulation defect, unspecified: Secondary | ICD-10-CM | POA: Diagnosis not present

## 2018-01-14 DIAGNOSIS — D631 Anemia in chronic kidney disease: Secondary | ICD-10-CM | POA: Diagnosis not present

## 2018-01-14 DIAGNOSIS — N2581 Secondary hyperparathyroidism of renal origin: Secondary | ICD-10-CM | POA: Diagnosis not present

## 2018-01-14 LAB — GASTROINTESTINAL PANEL BY PCR, STOOL (REPLACES STOOL CULTURE)
ADENOVIRUS F40/41: NOT DETECTED
Astrovirus: NOT DETECTED
CAMPYLOBACTER SPECIES: NOT DETECTED
CRYPTOSPORIDIUM: NOT DETECTED
Cyclospora cayetanensis: NOT DETECTED
ENTEROPATHOGENIC E COLI (EPEC): NOT DETECTED
Entamoeba histolytica: NOT DETECTED
Enteroaggregative E coli (EAEC): NOT DETECTED
Enterotoxigenic E coli (ETEC): NOT DETECTED
Giardia lamblia: NOT DETECTED
Norovirus GI/GII: NOT DETECTED
PLESIMONAS SHIGELLOIDES: NOT DETECTED
ROTAVIRUS A: NOT DETECTED
SHIGA LIKE TOXIN PRODUCING E COLI (STEC): NOT DETECTED
SHIGELLA/ENTEROINVASIVE E COLI (EIEC): NOT DETECTED
Salmonella species: NOT DETECTED
Sapovirus (I, II, IV, and V): NOT DETECTED
Vibrio cholerae: NOT DETECTED
Vibrio species: NOT DETECTED
YERSINIA ENTEROCOLITICA: NOT DETECTED

## 2018-01-15 ENCOUNTER — Other Ambulatory Visit: Payer: Self-pay

## 2018-01-15 ENCOUNTER — Encounter: Payer: Self-pay | Admitting: Pharmacist

## 2018-01-15 ENCOUNTER — Other Ambulatory Visit: Payer: Self-pay | Admitting: Pharmacist

## 2018-01-15 NOTE — Patient Outreach (Signed)
[place] [facility]) Care Management  [place]   01/15/2018  [place] B Joo 1940-10-30 [id number]  Subjective: Home visit completed today in the patient's home. [facility] Clinical Pharmacist, Lottie Dawson was present for the visit.    Patient is a 77 year old female with multiple medical conditions including but not limited to: ESRD on hemodialysis with secondary dialysis disequilibrium syndrome, aortic stenosis, chronic diastolic heart failure, essential hypertension, gastroesophageal reflux disease, osteoarthritis, and pulmonary nodules. Patient was recently admitted for intractable nausea and vomiting.  Patient manages her medications with some assistance from her granddaughter. She refuses to use a pill box and is not interested in pill packs.   Objective:   Encounter Medications: Outpatient Encounter Medications as of 01/15/2018  Medication Sig Note  . acetaminophen (TYLENOL) 500 MG tablet Take 2 tablets (1,000 mg total) by mouth every 8 (eight) hours as needed for mild pain, moderate pain, fever or headache.   Marland Kitchen amLODipine (NORVASC) 5 MG tablet Take 1 tablet (5 mg total) by mouth daily.   Marland Kitchen aspirin EC 81 MG EC tablet Take 1 tablet (81 mg total) by mouth daily.   . calcitRIOL (ROCALTROL) 0.5 MCG capsule Take 4 capsules (2 mcg total) by mouth every Monday, Wednesday, and Friday with hemodialysis. 01/15/2018: Given at dailysis  . calcium acetate (PHOSLO) 667 MG capsule Take 2 capsules (1,334 mg total) by mouth 3 (three) times daily with meals.   . multivitamin (RENA-VIT) TABS tablet Take 1 tablet by mouth at bedtime.   . pantoprazole (PROTONIX) 40 MG tablet Take 1 tablet (40 mg total) by mouth daily.   . promethazine (PHENERGAN) 12.5 MG tablet Take 1 tablet (12.5 mg total) by mouth every 6 (six) hours as needed for nausea or vomiting (Take 1 prior to dialysis).   Marland Kitchen dextromethorphan-guaiFENesin (MUCINEX DM) 30-600 MG 12hr tablet Take 1 tablet by mouth 2 (two) times daily as  needed for up to 30 doses for cough. (Patient not taking: Reported on 01/15/2018)   . loperamide (IMODIUM A-D) 2 MG tablet Take 1 tablet (2 mg total) by mouth 4 (four) times daily as needed for diarrhea or loose stools. (Patient not taking: Reported on 01/15/2018)   . nitroGLYCERIN (NITROSTAT) 0.3 MG SL tablet Place 1 tablet (0.3 mg total) under the tongue every 5 (five) minutes as needed for chest pain.   . sucroferric oxyhydroxide (VELPHORO) 500 MG chewable tablet Chew 500 mg by mouth 3 (three) times daily with meals. 01/11/2018: Have not started yet- can't open them   No facility-administered encounter medications on file as of 01/15/2018.     Functional Status: In your present state of health, do you have any difficulty performing the following activities: 01/11/2018 12/25/2017  Hearing? N -  Vision? Y -  Difficulty concentrating or making decisions? N -  Walking or climbing stairs? Y -  Comment - -  Dressing or bathing? N -  Doing errands, shopping? Y Y  Some recent data might be hidden    Fall/Depression Screening: Fall Risk  01/06/2018 12/25/2017 10/30/2017  Falls in the past year? Yes Yes Yes  Number falls in past yr: 1 1 1   Injury with Fall? No No No  Risk Factor Category  - - -  Risk for fall due to : Impaired balance/gait;Impaired mobility;Impaired vision;History of fall(s) - -  Risk for fall due to: Comment - - -  Follow up Falls prevention discussed - -   PHQ 2/9 Scores 01/06/2018 12/25/2017 12/24/2017 10/30/2017 08/07/2017 07/31/2017  07/31/2017  PHQ - 2 Score 0 0 0 0 0 0 0      Assessment: ASSESSMENT: Date Discharged from Hospital: 01/13/18 Date Medication Reconciliation Performed: 01/15/2018  Medications Discontinued at Discharge:   none  No new medications were prescribed at discharge.  Patient was recently discharged from hospital and all medications have been reviewed   Drugs sorted by system:  Pulmonary/Allergy Dextromethorphan-guaifenesin (did not have due to  cost)   Cardiovascular: Aspirin Nitroglycerin (did not have) Amlodipine ( did not have at home visit, [facility] was called. It was ready at time for pick up    Gastrointestinal: Loperamide (did not have) Promethazine Pantoprazole   Renal: Calcium Acetate [address] Calcitriol Velphoro (has not started yet)  Pain: Acetaminophen  Medication Review Findings:  -Amlodipine-patient did not have any amlodipine in her possession. She said it was ready for pick up at [facility]. [facility] was called. [facility] confirmed amlodipine 5mg  was ready to be picked up with a copay of 0.71.  Patient said her granddaughter was going to pick up her prescription today.  While discussing the amlodipine, the patient shared that she put on an old Clonidine 0.1mg  patch.   Clonidine patches have been discussed at length as they have been discontinued.  The patient said she will take the patch off when she gets the amlodipine.  Patient also complained of dizziness but was very adamant that her symptoms were not being caused by Clonidine.   Plan:  Route note to the patient's PCP and Dialysis provider Follow up with patient after her PCP visit 7/116/19

## 2018-01-15 NOTE — Patient Outreach (Signed)
[facility]) Care Management  01/15/2018  [place] B Swindler [birth year] [id number]   Called patient to review her medications post discharge. HIPAA identifiers were obtained.  Patient is a 77 year old female with multiple medical conditions including but not limited to: anemia, angina, ESRD on hemodialysis, GERD, hypertension, hyperlipidemia, and osteoarthritis.  Patient was recently hospitalized for protracted nausea and vomiting.  Although she preliminarily reviewed her medications over the phone, patient asked that I come to her home for a medication review.  Plan: Pharmacy home visit today at 12:30pm  Elayne Guerin, PharmD, [place] Clinical Pharmacist [phone]

## 2018-01-15 NOTE — Patient Outreach (Signed)
[name]) Care Management  01/15/2018  Nancy [name] [birth year] [id number]  Recent hospitalization 6/20-6/25 with intractable nausea vomiting. 7/7-7/9 viral gastroenteritis, ESRD.  77 year old with history of ESRD/HD  RNCM called post hospitalization. Client reports that she is better since hospitalization. She states that she has some of her medications, but is not able to get all the medications from her pharmacy. Per chart, [facility] pharmacist has a scheduled visit to see client today and client acknowledged this appointment.  Plan: home visit next week.  Thea Silversmith, RN, MSN, [place] Coordinator Cell: [phone]

## 2018-01-16 DIAGNOSIS — N186 End stage renal disease: Secondary | ICD-10-CM | POA: Diagnosis not present

## 2018-01-16 DIAGNOSIS — N2581 Secondary hyperparathyroidism of renal origin: Secondary | ICD-10-CM | POA: Diagnosis not present

## 2018-01-16 DIAGNOSIS — D509 Iron deficiency anemia, unspecified: Secondary | ICD-10-CM | POA: Diagnosis not present

## 2018-01-16 DIAGNOSIS — R51 Headache: Secondary | ICD-10-CM | POA: Diagnosis not present

## 2018-01-16 DIAGNOSIS — D631 Anemia in chronic kidney disease: Secondary | ICD-10-CM | POA: Diagnosis not present

## 2018-01-16 DIAGNOSIS — D689 Coagulation defect, unspecified: Secondary | ICD-10-CM | POA: Diagnosis not present

## 2018-01-19 DIAGNOSIS — N186 End stage renal disease: Secondary | ICD-10-CM | POA: Diagnosis not present

## 2018-01-19 DIAGNOSIS — N2581 Secondary hyperparathyroidism of renal origin: Secondary | ICD-10-CM | POA: Diagnosis not present

## 2018-01-19 DIAGNOSIS — R51 Headache: Secondary | ICD-10-CM | POA: Diagnosis not present

## 2018-01-19 DIAGNOSIS — D631 Anemia in chronic kidney disease: Secondary | ICD-10-CM | POA: Diagnosis not present

## 2018-01-19 DIAGNOSIS — D689 Coagulation defect, unspecified: Secondary | ICD-10-CM | POA: Diagnosis not present

## 2018-01-19 DIAGNOSIS — D509 Iron deficiency anemia, unspecified: Secondary | ICD-10-CM | POA: Diagnosis not present

## 2018-01-20 ENCOUNTER — Encounter: Payer: Self-pay | Admitting: Internal Medicine

## 2018-01-20 ENCOUNTER — Ambulatory Visit (INDEPENDENT_AMBULATORY_CARE_PROVIDER_SITE_OTHER): Payer: Medicare Other | Admitting: Internal Medicine

## 2018-01-20 ENCOUNTER — Ambulatory Visit: Payer: Self-pay

## 2018-01-20 VITALS — BP 164/71 | HR 95 | Temp 98.9°F | Wt 131.8 lb

## 2018-01-20 DIAGNOSIS — I509 Heart failure, unspecified: Secondary | ICD-10-CM

## 2018-01-20 DIAGNOSIS — M1711 Unilateral primary osteoarthritis, right knee: Secondary | ICD-10-CM | POA: Diagnosis not present

## 2018-01-20 DIAGNOSIS — G8929 Other chronic pain: Secondary | ICD-10-CM

## 2018-01-20 DIAGNOSIS — Z79899 Other long term (current) drug therapy: Secondary | ICD-10-CM

## 2018-01-20 DIAGNOSIS — K219 Gastro-esophageal reflux disease without esophagitis: Secondary | ICD-10-CM | POA: Diagnosis not present

## 2018-01-20 DIAGNOSIS — R197 Diarrhea, unspecified: Secondary | ICD-10-CM | POA: Diagnosis not present

## 2018-01-20 DIAGNOSIS — N186 End stage renal disease: Secondary | ICD-10-CM

## 2018-01-20 DIAGNOSIS — IMO0002 Reserved for concepts with insufficient information to code with codable children: Secondary | ICD-10-CM

## 2018-01-20 DIAGNOSIS — M1710 Unilateral primary osteoarthritis, unspecified knee: Secondary | ICD-10-CM

## 2018-01-20 DIAGNOSIS — Z992 Dependence on renal dialysis: Secondary | ICD-10-CM

## 2018-01-20 MED ORDER — DICLOFENAC SODIUM 1 % TD GEL: 4.0000 g | Freq: Four times a day (QID) | TRANSDERMAL | 2 refills | Status: DC

## 2018-01-20 NOTE — Patient Instructions (Signed)
Dear Mrs. Nancy [name],  You came to the clinic after your recent stay at the hospital for diarrhea. We are glad you are feeling much better after your recent hospitalization. We recommend that you stop the Loperamide (Imodium) and we will prescribe you the Voltaren Gel for your knee pain. Thank you for visiting the clinic.  -Gilberto Better

## 2018-01-20 NOTE — Progress Notes (Signed)
   CC: Follow up after hospitalization for diarrhea  HPI: [name] is a 77 y.o. F w/ PMH of CHF, ESRD on dialysis and history of arthritis presenting to the clinic after recent hospitalization for diarrhea. It was not deemed to be due to an infectious source so she was discharged with loperamide as well as protonix for GERD related cough. During hospitalization her stool sample was collected and the results showed negative for viral or bacterial source of diarrhea. Since discharge, she had 1 episode of loose stool but otherwise the diarrhea appears to have resolved. She was able to return to her regularly scheduled dialysis sessions MWF and her cough frequency has reduced. She has a complaint of Right Knee pain which is worse with exertion but alleviated by rest accompanied by swelling but no fever or chills. She is accompanied by her granddaughters who state that she does not have time for extensive work up today because she is about to be late for her bible study.  Past Medical History:  Diagnosis Date  . Anemia   . Aortic stenosis   . Bacterial sinusitis 09/17/2011  . CHF (congestive heart failure) ([place])   . CKD (chronic kidney disease) stage 4, GFR 15-29 ml/min ([place]) 08/11/2006   Cr continues to increase. Proteinuria on UA 02/10/12.    . Colitis   . CVA (cerebrovascular accident) [facility])    New hemorrhagic per CT scan '09  . Diverticulosis of colon   . Dysfunctional uterine bleeding   . ESRD (end stage renal disease) on dialysis ([place])    "MWF; [facility]" (11/27/2017)  . Fecal impaction ([place])   . Headache(784.0)   . Heart murmur   . HERNIORRHAPHY, HX OF 08/11/2006  . Hypertension   . OA (osteoarthritis)    bilateral knees  . Postmenopausal   . Pulmonary nodule   . TINEA CRURIS 01/12/2007    Review of Systems: Review of Systems  Constitutional: Negative for chills, fever and malaise/fatigue.  Eyes: Negative for blurred vision, double vision and pain.  Respiratory: Negative for  cough, shortness of breath and wheezing.   Gastrointestinal: Negative for abdominal pain, blood in stool, constipation, diarrhea, melena, nausea and vomiting.  Neurological: Negative for dizziness, tingling, sensory change, weakness and headaches.     Physical Exam: Vitals:   01/20/18 1532  BP: (!) 164/71  Pulse: 95  Temp: 98.9 F (37.2 C)  TempSrc: Oral  SpO2: 100%  Weight: 131 lb 12.8 oz (59.8 kg)    Physical Exam  Constitutional: She appears well-developed and well-nourished. No distress.  Cardiovascular: Normal rate, regular rhythm, normal heart sounds and intact distal pulses.  Respiratory: Effort normal and breath sounds normal. No respiratory distress. She has no wheezes.  GI: Soft. Bowel sounds are normal. She exhibits no distension. There is no tenderness. There is no guarding.  Musculoskeletal: Normal range of motion.  Right knee > left knee in size. Tender to palpation w/o erythema, warmth or  significant edema. No significant effusion. Bilateral lower extremities 1+ pitting edema      Assessment & Plan:   See Encounters Tab for problem based charting.  Patient seen with Dr. Evette Doffing   -Gilberto Better, PGY1

## 2018-01-21 ENCOUNTER — Ambulatory Visit: Payer: Self-pay

## 2018-01-21 ENCOUNTER — Other Ambulatory Visit: Payer: Self-pay

## 2018-01-21 DIAGNOSIS — N186 End stage renal disease: Secondary | ICD-10-CM | POA: Diagnosis not present

## 2018-01-21 DIAGNOSIS — D689 Coagulation defect, unspecified: Secondary | ICD-10-CM | POA: Diagnosis not present

## 2018-01-21 DIAGNOSIS — N2581 Secondary hyperparathyroidism of renal origin: Secondary | ICD-10-CM | POA: Diagnosis not present

## 2018-01-21 DIAGNOSIS — D509 Iron deficiency anemia, unspecified: Secondary | ICD-10-CM | POA: Diagnosis not present

## 2018-01-21 DIAGNOSIS — D631 Anemia in chronic kidney disease: Secondary | ICD-10-CM | POA: Diagnosis not present

## 2018-01-21 DIAGNOSIS — R51 Headache: Secondary | ICD-10-CM | POA: Diagnosis not present

## 2018-01-21 NOTE — Assessment & Plan Note (Signed)
-   Most recent hospitalization 2/2 diarrhea of presumed infectious origin - Stool studies came back negative for viral/bacterial causes of diarrhea - 1 ep of loose stool since discharge - Diarrhea appears to have self-resolved - Advised patient to discontinue loperamide

## 2018-01-21 NOTE — Assessment & Plan Note (Addendum)
-   Complains of right knee pain which is a chronic issue - X-ray in 02/2017 confirm degenerative changes in the knee - Physical exam show right knee > left knee with tenderness to palpation w/o erythema, warmth, edema, no effusion palpable - Patient requesting pain relief but doing further work up at a later date 2/2 time constraints - Unable to tolerate oral NSAIDs 2/2 ESRD - Will order topical voltaren gel for pain relief

## 2018-01-21 NOTE — Patient Outreach (Signed)
[place] [facility]) Care Management  01/21/2018  Nancy [name] December 08, 1940 [id number]   Care Coordniation: RNCM called regarding confirmation of home visit. No answer. HIPPA compliant message left. RNCM also called mobile number and received voice message that the person was not receiving calls at this time.  RNCM called client twice an hour apart. No answer. HIPPA compliant message left.  Plan: send outreach letter. continue to outreach client.  Thea Silversmith, RN, MSN, [place] Coordinator Cell: [phone]

## 2018-01-21 NOTE — Progress Notes (Signed)
Internal Medicine Clinic Attending  I saw and evaluated the patient.  I personally confirmed the key portions of the history and exam documented by Dr. Lee and I reviewed pertinent patient test results.  The assessment, diagnosis, and plan were formulated together and I agree with the documentation in the resident's note.  

## 2018-01-22 ENCOUNTER — Other Ambulatory Visit: Payer: Self-pay | Admitting: Pharmacist

## 2018-01-22 ENCOUNTER — Encounter: Payer: Self-pay | Admitting: Pharmacist

## 2018-01-22 NOTE — Patient Outreach (Addendum)
[facility]) Care Management  [place]   01/22/2018  Nancy [name] 01-05-1941 [id number]  Subjective: Patient was called regarding medication management and for follow up. HIPAA identifiers were obtained.  Patient is a 77 year old female with multiple medical conditions including but not limited to:  ESRD on hemodialysis with secondary dialysis disequilibrium syndrome, aortic stenosis, chronic diastolic heart failure, essential hypertension, gastroesophageal reflux disease, osteoarthritis, and pulmonary nodules.  She had a [place] Visit 01/15/18 and a post discharge PCP visit on 01/20/18  Objective:   Encounter Medications: Outpatient Encounter Medications as of 01/22/2018  Medication Sig Note  . acetaminophen (TYLENOL) 500 MG tablet Take 2 tablets (1,000 mg total) by mouth every 8 (eight) hours as needed for mild pain, moderate pain, fever or headache.   Marland Kitchen amLODipine (NORVASC) 5 MG tablet Take 1 tablet (5 mg total) by mouth daily.   Marland Kitchen aspirin EC 81 MG EC tablet Take 1 tablet (81 mg total) by mouth daily.   . calcitRIOL (ROCALTROL) 0.5 MCG capsule Take 4 capsules (2 mcg total) by mouth every Monday, Wednesday, and Friday with hemodialysis. 01/15/2018: Given at dailysis  . calcium acetate (PHOSLO) 667 MG capsule Take 2 capsules (1,334 mg total) by mouth 3 (three) times daily with meals.   Marland Kitchen dextromethorphan-guaiFENesin (MUCINEX DM) 30-600 MG 12hr tablet Take 1 tablet by mouth 2 (two) times daily as needed for up to 30 doses for cough.   . diclofenac sodium (VOLTAREN) 1 % GEL Apply 4 g topically 4 (four) times daily.   . multivitamin (RENA-VIT) TABS tablet Take 1 tablet by mouth at bedtime.   . nitroGLYCERIN (NITROSTAT) 0.3 MG SL tablet Place 1 tablet (0.3 mg total) under the tongue every 5 (five) minutes as needed for chest pain.   . pantoprazole (PROTONIX) 40 MG tablet Take 1 tablet (40 mg total) by mouth daily.   . promethazine (PHENERGAN) 12.5 MG tablet  Take 1 tablet (12.5 mg total) by mouth every 6 (six) hours as needed for nausea or vomiting (Take 1 prior to dialysis).   . sucroferric oxyhydroxide (VELPHORO) 500 MG chewable tablet Chew 500 mg by mouth 3 (three) times daily with meals. 01/11/2018: Have not started yet- can't open them   No facility-administered encounter medications on file as of 01/22/2018.     Functional Status: In your present state of health, do you have any difficulty performing the following activities: 01/20/2018 01/11/2018  Hearing? N N  Vision? Y Y  Difficulty concentrating or making decisions? N N  Walking or climbing stairs? Y Y  Comment - -  Dressing or bathing? N N  Doing errands, shopping? Y Y  Some recent data might be hidden    Fall/Depression Screening: Fall Risk  01/20/2018 01/06/2018 12/25/2017  Falls in the past year? Yes Yes Yes  Number falls in past yr: 1 1 1   Injury with Fall? No No No  Risk Factor Category  - - -  Risk for fall due to : Impaired mobility;Impaired balance/gait;History of fall(s);Impaired vision Impaired balance/gait;Impaired mobility;Impaired vision;History of fall(s) -  Risk for fall due to: Comment - - -  Follow up Falls prevention discussed Falls prevention discussed -   PHQ 2/9 Scores 01/20/2018 01/06/2018 12/25/2017 12/24/2017 10/30/2017 08/07/2017 07/31/2017  PHQ - 2 Score 0 0 0 0 0 0 0      Assessment: Patient's medications were reviewed via telephone:  Drugs sorted by system:  Pulmonary/Allergy Dextromethorphan-guaifenesin (did not have due to cost)  Cardiovascular: Aspirin Nitroglycerin (did not have) Amlodipine   Gastrointestinal: Promethazine Pantoprazole   Renal: Calcium Acetate [address] Calcitriol Velphoro (has not started yet--reported she cannot open the bottle)  Pain: Acetaminophen    Plan: Called [facility] to request "easy open tops/non-safety caps"--patient authorized this request  Called the Dialysis center as the [place] has dispensed binders for the patient to request  non-safety caps for the medications they dispense as well.    Follow up in 7-10 business days.  Consider closing case.   Elayne Guerin, PharmD, BCACP [facility] Clinical Pharmacist [phone]   ADDENDUM  -patient's granddaughter [name]) called back, HIPAA identifiers were obtained. Tequila reviewed the patient's medications over the phone. She confirmed that although the amlodipine prescription was picked up from the pharmacy...patient has not been taking amlodipine as prescribed because she thinks It causes her to be dizzy.    Patient's granddaughter said she would encourage her grandmother to take her medications as prescribed. The need for non-safety caps was mentioned to [place] as well.     Plan: Route note to PCP.  Elayne Guerin, PharmD, [place] Clinical Pharmacist [phone]

## 2018-01-23 ENCOUNTER — Other Ambulatory Visit: Payer: Self-pay

## 2018-01-23 DIAGNOSIS — D631 Anemia in chronic kidney disease: Secondary | ICD-10-CM | POA: Diagnosis not present

## 2018-01-23 DIAGNOSIS — D689 Coagulation defect, unspecified: Secondary | ICD-10-CM | POA: Diagnosis not present

## 2018-01-23 DIAGNOSIS — D509 Iron deficiency anemia, unspecified: Secondary | ICD-10-CM | POA: Diagnosis not present

## 2018-01-23 DIAGNOSIS — R51 Headache: Secondary | ICD-10-CM | POA: Diagnosis not present

## 2018-01-23 DIAGNOSIS — N2581 Secondary hyperparathyroidism of renal origin: Secondary | ICD-10-CM | POA: Diagnosis not present

## 2018-01-23 DIAGNOSIS — N186 End stage renal disease: Secondary | ICD-10-CM | POA: Diagnosis not present

## 2018-01-23 NOTE — Patient Outreach (Signed)
[facility]) Care Management  01/23/2018  Nancy [name] 05/25/1941 [id number]  Recent hospitalization 6/20-6/25 with intractable nausea vomiting. 7/7-7/9 viral gastroenteritis, ESRD.  77 year old with history of ESRD/HD, heart failure, stroke.  RNCM called to follow up. No answer. HIPPA compliant message left.  Plan: send outreach letter and follow up within 3-4 business days if no return call.  Thea Silversmith, RN, MSN, [place] Coordinator Cell: [phone]

## 2018-01-25 ENCOUNTER — Observation Stay (HOSPITAL_COMMUNITY)
Admission: EM | Admit: 2018-01-25 | Discharge: 2018-01-26 | Disposition: A | Discharge status: Home / Self Care | Payer: Medicare Other | Attending: Internal Medicine | Admitting: Internal Medicine

## 2018-01-25 ENCOUNTER — Other Ambulatory Visit: Payer: Self-pay

## 2018-01-25 ENCOUNTER — Encounter (HOSPITAL_COMMUNITY): Payer: Self-pay | Admitting: Emergency Medicine

## 2018-01-25 ENCOUNTER — Emergency Department (HOSPITAL_COMMUNITY): Payer: Medicare Other

## 2018-01-25 DIAGNOSIS — R000 Tachycardia, unspecified: Secondary | ICD-10-CM | POA: Diagnosis not present

## 2018-01-25 DIAGNOSIS — I471 Supraventricular tachycardia: Secondary | ICD-10-CM | POA: Diagnosis not present

## 2018-01-25 DIAGNOSIS — I132 Hypertensive heart and chronic kidney disease with heart failure and with stage 5 chronic kidney disease, or end stage renal disease: Secondary | ICD-10-CM | POA: Insufficient documentation

## 2018-01-25 DIAGNOSIS — R05 Cough: Secondary | ICD-10-CM | POA: Insufficient documentation

## 2018-01-25 DIAGNOSIS — K219 Gastro-esophageal reflux disease without esophagitis: Secondary | ICD-10-CM | POA: Insufficient documentation

## 2018-01-25 DIAGNOSIS — R1111 Vomiting without nausea: Secondary | ICD-10-CM | POA: Diagnosis not present

## 2018-01-25 DIAGNOSIS — R531 Weakness: Secondary | ICD-10-CM | POA: Diagnosis not present

## 2018-01-25 DIAGNOSIS — R112 Nausea with vomiting, unspecified: Secondary | ICD-10-CM | POA: Diagnosis not present

## 2018-01-25 DIAGNOSIS — R109 Unspecified abdominal pain: Secondary | ICD-10-CM | POA: Diagnosis present

## 2018-01-25 DIAGNOSIS — Z7982 Long term (current) use of aspirin: Secondary | ICD-10-CM | POA: Insufficient documentation

## 2018-01-25 DIAGNOSIS — I700 Atherosclerosis of aorta: Secondary | ICD-10-CM | POA: Diagnosis not present

## 2018-01-25 DIAGNOSIS — Z8673 Personal history of transient ischemic attack (TIA), and cerebral infarction without residual deficits: Secondary | ICD-10-CM | POA: Insufficient documentation

## 2018-01-25 DIAGNOSIS — E43 Unspecified severe protein-calorie malnutrition: Secondary | ICD-10-CM | POA: Insufficient documentation

## 2018-01-25 DIAGNOSIS — N186 End stage renal disease: Secondary | ICD-10-CM | POA: Diagnosis not present

## 2018-01-25 DIAGNOSIS — E785 Hyperlipidemia, unspecified: Secondary | ICD-10-CM | POA: Insufficient documentation

## 2018-01-25 DIAGNOSIS — H409 Unspecified glaucoma: Secondary | ICD-10-CM | POA: Diagnosis not present

## 2018-01-25 DIAGNOSIS — D631 Anemia in chronic kidney disease: Secondary | ICD-10-CM | POA: Diagnosis not present

## 2018-01-25 DIAGNOSIS — E872 Acidosis: Secondary | ICD-10-CM | POA: Insufficient documentation

## 2018-01-25 DIAGNOSIS — N390 Urinary tract infection, site not specified: Secondary | ICD-10-CM

## 2018-01-25 DIAGNOSIS — Z6823 Body mass index (BMI) 23.0-23.9, adult: Secondary | ICD-10-CM | POA: Diagnosis not present

## 2018-01-25 DIAGNOSIS — R110 Nausea: Secondary | ICD-10-CM | POA: Diagnosis not present

## 2018-01-25 DIAGNOSIS — I350 Nonrheumatic aortic (valve) stenosis: Secondary | ICD-10-CM | POA: Insufficient documentation

## 2018-01-25 DIAGNOSIS — Z992 Dependence on renal dialysis: Secondary | ICD-10-CM | POA: Insufficient documentation

## 2018-01-25 DIAGNOSIS — Z885 Allergy status to narcotic agent status: Secondary | ICD-10-CM | POA: Insufficient documentation

## 2018-01-25 DIAGNOSIS — Z8249 Family history of ischemic heart disease and other diseases of the circulatory system: Secondary | ICD-10-CM | POA: Insufficient documentation

## 2018-01-25 DIAGNOSIS — Z79899 Other long term (current) drug therapy: Secondary | ICD-10-CM | POA: Insufficient documentation

## 2018-01-25 DIAGNOSIS — Z90710 Acquired absence of both cervix and uterus: Secondary | ICD-10-CM | POA: Insufficient documentation

## 2018-01-25 DIAGNOSIS — R52 Pain, unspecified: Secondary | ICD-10-CM | POA: Diagnosis not present

## 2018-01-25 DIAGNOSIS — K5730 Diverticulosis of large intestine without perforation or abscess without bleeding: Secondary | ICD-10-CM | POA: Diagnosis not present

## 2018-01-25 DIAGNOSIS — I5032 Chronic diastolic (congestive) heart failure: Secondary | ICD-10-CM | POA: Insufficient documentation

## 2018-01-25 DIAGNOSIS — N2581 Secondary hyperparathyroidism of renal origin: Secondary | ICD-10-CM | POA: Diagnosis not present

## 2018-01-25 DIAGNOSIS — R1084 Generalized abdominal pain: Secondary | ICD-10-CM | POA: Diagnosis not present

## 2018-01-25 DIAGNOSIS — R0602 Shortness of breath: Secondary | ICD-10-CM | POA: Diagnosis not present

## 2018-01-25 DIAGNOSIS — Z9049 Acquired absence of other specified parts of digestive tract: Secondary | ICD-10-CM | POA: Diagnosis not present

## 2018-01-25 LAB — COMPREHENSIVE METABOLIC PANEL
ALT: 8 U/L (ref 0–44)
AST: 13 U/L — ABNORMAL LOW (ref 15–41)
Albumin: 3 g/dL — ABNORMAL LOW (ref 3.5–5.0)
Alkaline Phosphatase: 43 U/L (ref 38–126)
Anion gap: 13 (ref 5–15)
BUN: 40 mg/dL — ABNORMAL HIGH (ref 8–23)
CHLORIDE: 101 mmol/L (ref 98–111)
CO2: 27 mmol/L (ref 22–32)
CREATININE: 8.28 mg/dL — AB (ref 0.44–1.00)
Calcium: 9.8 mg/dL (ref 8.9–10.3)
GFR calc non Af Amer: 4 mL/min — ABNORMAL LOW (ref >60)
GFR, EST AFRICAN AMERICAN: 5 mL/min — AB (ref >60)
Glucose, Bld: 98 mg/dL (ref 70–99)
POTASSIUM: 4.3 mmol/L (ref 3.5–5.1)
Sodium: 141 mmol/L (ref 135–145)
TOTAL PROTEIN: 6.5 g/dL (ref 6.5–8.1)
Total Bilirubin: 0.6 mg/dL (ref 0.3–1.2)

## 2018-01-25 LAB — URINALYSIS, ROUTINE W REFLEX MICROSCOPIC
Bilirubin Urine: NEGATIVE
Glucose, UA: NEGATIVE mg/dL
Hgb urine dipstick: NEGATIVE
Ketones, ur: NEGATIVE mg/dL
Nitrite: NEGATIVE
Protein, ur: 100 mg/dL — AB
Specific Gravity, Urine: 1.011 (ref 1.005–1.030)
Squamous Epithelial / HPF: >50 — ABNORMAL HIGH (ref 0–5)
pH: 8 (ref 5.0–8.0)

## 2018-01-25 LAB — CBC
HCT: 31.9 % — ABNORMAL LOW (ref 36.0–46.0)
Hemoglobin: 9.8 g/dL — ABNORMAL LOW (ref 12.0–15.0)
MCH: 30.5 pg (ref 26.0–34.0)
MCHC: 30.7 g/dL (ref 30.0–36.0)
MCV: 99.4 fL (ref 78.0–100.0)
PLATELETS: 181 10*3/uL (ref 150–400)
RBC: 3.21 MIL/uL — AB (ref 3.87–5.11)
RDW: 15.7 % — ABNORMAL HIGH (ref 11.5–15.5)
WBC: 5.1 10*3/uL (ref 4.0–10.5)

## 2018-01-25 LAB — LIPASE, BLOOD: LIPASE: 44 U/L (ref 11–51)

## 2018-01-25 MED ORDER — IOPAMIDOL (ISOVUE-300) INJECTION 61%
INTRAVENOUS | Status: AC
  Filled 2018-01-25: qty 30

## 2018-01-25 MED ORDER — SODIUM CHLORIDE 0.9 % IV SOLN
1.0000 g | Freq: Once | INTRAVENOUS | Status: AC
  Administered 2018-01-25: 1 g via INTRAVENOUS
  Filled 2018-01-25: qty 10

## 2018-01-25 MED ORDER — SODIUM CHLORIDE 0.9 % IV SOLN
INTRAVENOUS | Status: DC | PRN
  Administered 2018-01-25: 23:00:00 via INTRAVENOUS

## 2018-01-25 NOTE — H&P (Addendum)
Date: 01/26/2018               Patient Name:  Nancy [name] MRN: [id number]  DOB: 1940/09/25 Age / Sex: 77 y.o., female   PCP: Aldine Contes, MD         Medical Service: Internal Medicine Teaching Service         Attending Physician: Dr. Aldine Contes, MD    First Contact: Dr. Aldona Bar (MS4) Pager: [phone]  Second Contact: Dr. Reesa Chew Pager: [phone]       After Hours (After 5p/  First Contact Pager: [phone]  weekends / holidays): Second Contact Pager: [phone]   Chief Complaint: Abdominal pain  History of Present Illness: This is a 77 year old female with a PMH of ESRD on M/W/F HD, aortic stenosis with calcific vegetative mass, anemia, HFpEF, HTN, GERD, secondary hyperparathyroidism 2/2 renal disease, and benign pulmonary nodules who has had multiple hospitalizations for GI issues presenting with a 2 day history of abdominal pain and emesis. She states that it is located in the LLQ and RLQ that sometimes radiates to the back, is constant, throbbing/sharp pain, rates it a 10/10, nothing seems to alleviate or aggravate it. It is associated with nausea, vomiting, anorexia, and chronic oliguria, which she states she only makes about 1 cup of urine every 3 days. She reports non-bilious, non-bloody emesis that occurs 2x/day. She also endorses generalized weakness, chills, headache, and blurry vision. The weakness started 2 months ago, and has been getting worse, she states that she feels like she will fall at home. She denied any fever, metallic taste in her mouth, dysuria, hematuria, or chest pain. She went to HD on Friday and stopped early due to discomfort and feeling nauseous, she got about 3 hours of dialysis. She has a history of nausea and vomiting associated with HD. She does state she takes her phenergan daily. She denied any recent change in her diet, and states her last meal was 3 days ago, she states she has not had an appetite.   Per chart review patient has had multiple  admissions for GI symptoms. She was last admitted from 7/7-7/9 for viral gastroenteritis, she had adominal pain, anorexia, and watery, non-bloody diarrhea. A GI panel, stool O&P, and stool cultures were taken, which came back negative. Admitted 6/20-6/25 for intractable nausea and vomiting, at that time they did a CT abdomen/pelvis, KUBs, and mesenteric u/s which showed patent celiac and SMA arteries, IMA was not visualized at that time. There was previous concern for chronic mesenteric ischemia in the past. She has a history of developing hypotension during dialysis.   In the ED, patient was afebrile, tachycardic to 101, tachypneic to 26, and hypertensive to 158/76. Her significant labs were a BUN of 40, Cr 8.28, and a Hgb of 9.8. U/A showed some leukocytes, WBCs, and squamous epithelium, but no nitrites, Hgb. CT abdomen showed no acute abnormalities, with diverticulosis, small uterine fibroids, and stable diffuse bilateral renal parenchymal atrophy. She was given empiric ceftriaxone for suspected UTI. Patient was admitted to internal medicine.     Meds:  No current facility-administered medications on file prior to encounter.    Current Outpatient Medications on File Prior to Encounter  Medication Sig Dispense Refill  . acetaminophen (TYLENOL) 500 MG tablet Take 2 tablets (1,000 mg total) by mouth every 8 (eight) hours as needed for mild pain, moderate pain, fever or headache. 30 tablet 0  . amLODipine (NORVASC) 5 MG tablet Take 1 tablet (5  mg total) by mouth daily. 30 tablet 0  . aspirin EC 81 MG EC tablet Take 1 tablet (81 mg total) by mouth daily. 30 tablet 1  . calcitRIOL (ROCALTROL) 0.5 MCG capsule Take 4 capsules (2 mcg total) by mouth every Monday, Wednesday, and Friday with hemodialysis.    Marland Kitchen dextromethorphan-guaiFENesin (MUCINEX DM) 30-600 MG 12hr tablet Take 1 tablet by mouth 2 (two) times daily as needed for up to 30 doses for cough. 30 tablet 0  . diclofenac sodium (VOLTAREN) 1 % GEL  Apply 4 g topically 4 (four) times daily. (Patient taking differently: Apply 4 g topically 4 (four) times daily as needed (pain). ) 1 Tube 2  . multivitamin (RENA-VIT) TABS tablet Take 1 tablet by mouth at bedtime. 30 tablet 0  . nitroGLYCERIN (NITROSTAT) 0.3 MG SL tablet Place 1 tablet (0.3 mg total) under the tongue every 5 (five) minutes as needed for chest pain. 30 tablet 0  . pantoprazole (PROTONIX) 40 MG tablet Take 1 tablet (40 mg total) by mouth daily. 30 tablet 0  . promethazine (PHENERGAN) 12.5 MG tablet Take 1 tablet (12.5 mg total) by mouth every 6 (six) hours as needed for nausea or vomiting (Take 1 prior to dialysis). 30 tablet 0  . sucroferric oxyhydroxide (VELPHORO) 500 MG chewable tablet Chew 500 mg by mouth 3 (three) times daily with meals.    . calcium acetate (PHOSLO) 667 MG capsule Take 2 capsules (1,334 mg total) by mouth 3 (three) times daily with meals. (Patient not taking: Reported on 01/25/2018) 180 capsule 0    Current Meds  Medication Sig  . acetaminophen (TYLENOL) 500 MG tablet Take 2 tablets (1,000 mg total) by mouth every 8 (eight) hours as needed for mild pain, moderate pain, fever or headache.  Marland Kitchen amLODipine (NORVASC) 5 MG tablet Take 1 tablet (5 mg total) by mouth daily.  Marland Kitchen aspirin EC 81 MG EC tablet Take 1 tablet (81 mg total) by mouth daily.  . calcitRIOL (ROCALTROL) 0.5 MCG capsule Take 4 capsules (2 mcg total) by mouth every Monday, Wednesday, and Friday with hemodialysis.  Marland Kitchen dextromethorphan-guaiFENesin (MUCINEX DM) 30-600 MG 12hr tablet Take 1 tablet by mouth 2 (two) times daily as needed for up to 30 doses for cough.  . diclofenac sodium (VOLTAREN) 1 % GEL Apply 4 g topically 4 (four) times daily. (Patient taking differently: Apply 4 g topically 4 (four) times daily as needed (pain). )  . multivitamin (RENA-VIT) TABS tablet Take 1 tablet by mouth at bedtime.  . nitroGLYCERIN (NITROSTAT) 0.3 MG SL tablet Place 1 tablet (0.3 mg total) under the tongue every 5  (five) minutes as needed for chest pain.  . pantoprazole (PROTONIX) 40 MG tablet Take 1 tablet (40 mg total) by mouth daily.  . promethazine (PHENERGAN) 12.5 MG tablet Take 1 tablet (12.5 mg total) by mouth every 6 (six) hours as needed for nausea or vomiting (Take 1 prior to dialysis).  . sucroferric oxyhydroxide (VELPHORO) 500 MG chewable tablet Chew 500 mg by mouth 3 (three) times daily with meals.     Allergies: Allergies as of 01/25/2018 - Review Complete 01/25/2018  Allergen Reaction Noted  . Hydrocodone Nausea And Vomiting and Other (See Comments) 12/26/2017   Past Medical History:  Diagnosis Date  . Anemia   . Aortic stenosis   . Bacterial sinusitis 09/17/2011  . CHF (congestive heart failure) ([place])   . CKD (chronic kidney disease) stage 4, GFR 15-29 ml/min ([place]) 08/11/2006   Cr continues to increase.  Proteinuria on UA 02/10/12.    . Colitis   . CVA (cerebrovascular accident) [facility])    New hemorrhagic per CT scan '09  . Diverticulosis of colon   . Dysfunctional uterine bleeding   . ESRD (end stage renal disease) on dialysis ([place])    "MWF; [address] [address]" (11/27/2017)  . Fecal impaction ([place])   . Headache(784.0)   . Heart murmur   . HERNIORRHAPHY, HX OF 08/11/2006  . Hypertension   . OA (osteoarthritis)    bilateral knees  . Postmenopausal   . Pulmonary nodule   . TINEA CRURIS 01/12/2007    Family History: HTN and CHF in her mom, her brother had an MI.   Social History: Denied any EtOH, tobacco, or drug use. She lives alone in an apartment.   Review of Systems: A complete ROS was negative except as per HPI.   Physical Exam: Blood pressure (!) 182/88, pulse (!) 111, temperature 98.4 F (36.9 C), temperature source Oral, resp. rate 18, height 5\' 3"  (1.6 m), weight 134 lb 11.2 oz (61.1 kg), SpO2 100 %. General: alert, cooperative, appears stated age and no distress HEENT: PERRLA, neck supple with midline trachea and trachea midline Heart: 3/6 SEM is heard at 2nd right  intercostal space, S1 and S2 normal, no S3 or S4 Lungs: clear to auscultation, no wheezes or rales and unlabored breathing Abdomen: moderate tenderness in the in the RLQ and in the LLQ, minimal tenderness in suprapubic area, no rebound tenderness, no guarding or rigidity, no CVA tenderness Extremities: extremities normal, atraumatic, no cyanosis or edema Musculoskeletal: no joint tenderness, deformity or swelling Skin:no rashes, no ecchymoses Neurology: normal without focal findings, mental status, speech normal, alert and oriented x3, PERLA and reflexes normal and symmetric   EKG: personally reviewed my interpretation is rate of 106, no axis deviation, prolonged PR interval of 240, Q waves in aVR, V1 and V2, ST depression in leds II, III, and aVF.   Assessment & Plan by Problem: This is a 77 year old female with a PMH of ESRD on M/W/F HD, aortic stenosis with calcific vegetative mass, anemia, HFpEF (last EF 65-70%), HTN, GERD, secondary hyperparathyroidism 2/2 renal disease, and benign pulmonary nodules who has had multiple hospitalizations for GI issues presenting with a 2 day history of abdominal pain and emesis.   #Abdominal pain and emesis: Patient has been having abdominal pain and emesis for 2 days following her HD session. The pain is in the bilateral LQs, constant, throbbing, sharp in nature. She has had multiple similar episodes previously. On exam she was hypertensive, tachycardic, and tachypneic. Her abdomen was soft, normoactive bowel sounds, tender to palpation in RLQ and LLQ with minimal tenderness in suprapubic area, no guarding noted. She has had similar episodes in the past occurring with her hemodialysis. This is possibly related to the hypotension that she experiences during HD resulting in non-occlusive mesenteric ischemia. She did have a mesenteric doppler on 6/13, which may not have occurred during dialysis, which showed patent celiac and SMA arteries, however the IMA was not  well visualized. Another possible cause is uremic symptoms from her ESRD however the symptoms seem to occur with dialysis. Patient received 1 dose of ceftriaxone in the ED for suspected UTI, however when we evaluated the patient she denied any dysuria and she reported oliguria, her U/A did showed leukocytes and WBC's however it did have >50 squamous epithelium. Will discontinue antibiotics for now and monitor symptoms.  -Troponin -TSH -Tylenol PRN for pain -Promethazine  PRN -Renal function panel -Renal diet  #ESRD: Patient gets HD on M/W/F, she had her dialysis on Friday but did not complete the whole course due to feeling discomfort. On admission her BUN is 40 and Cr is 8.28, with a GFR of 5.  -Neprhology consult  -Consider inpatient hemodialysis on Monday   #Chronic cough: Patient has a history of cough that is worse at night. She has a history of GERD and was prescribed a PPI on her last admission.  -Continue daily PPI  #Hypertension: Patient is on amlodipine at home. She was hypertensive on admission.  - Continue amlodipine 5 mg PO -Monitor vitals  #GERD: -Continue home pantoprazole  FEN: No fluids, replete lytes prn, renal diet VTE ppx: SubQ heparin Code Status: DNR   Dispo: Admit patient to Observation with expected length of stay less than 2 midnights.  Signed: Asencion Noble, MD 01/26/2018, 1:47 AM  Pager: [phone]

## 2018-01-25 NOTE — ED Provider Notes (Signed)
[facility] EMERGENCY DEPARTMENT Provider Note   CSN: [id number] Arrival date & time: 01/25/18  1904     History   Chief Complaint Chief Complaint  Patient presents with  . Abdominal Pain  . Fatigue    HPI Nancy [name] is a 77 y.o. female with a past medical history of CHF, ESRD on dialysis MWF, prior CVA, who presents to ED for evaluation of 2-day history of lower abdominal pain, 3 episodes of nonbloody, nonbilious emesis since this morning.  Also reports chronic shortness of breath.  States that her symptoms got worse earlier today while she was at church.  Denies any changes to her bowel movements.  States that she does produce a small amount of urine and does have some dysuria associated with it.  No sick contacts with similar symptoms.  Denies any suspicious food ingestions.  Prior abdominal surgeries include hernia repair and possible cholecystectomy.  She is unsure if she has had an appendectomy before.  Denies any chest pain, fevers, vaginal complaints, injuries or falls. She completed her dialysis session on Friday but states that they stopped early due to her feeling sick.  HPI  Past Medical History:  Diagnosis Date  . Anemia   . Aortic stenosis   . Bacterial sinusitis 09/17/2011  . CHF (congestive heart failure) ([place])   . CKD (chronic kidney disease) stage 4, GFR 15-29 ml/min ([place]) 08/11/2006   Cr continues to increase. Proteinuria on UA 02/10/12.    . Colitis   . CVA (cerebrovascular accident) [facility])    New hemorrhagic per CT scan '09  . Diverticulosis of colon   . Dysfunctional uterine bleeding   . ESRD (end stage renal disease) on dialysis ([place])    "MWF; [facility]" (11/27/2017)  . Fecal impaction ([place])   . Headache(784.0)   . Heart murmur   . HERNIORRHAPHY, HX OF 08/11/2006  . Hypertension   . OA (osteoarthritis)    bilateral knees  . Postmenopausal   . Pulmonary nodule   . TINEA CRURIS 01/12/2007    Patient Active Problem List   Diagnosis  Date Noted  . Protein-calorie malnutrition, severe 01/12/2018  . ESRD (end stage renal disease) ([place])   . Diarrhea   . Chronic cough   . Hyperkalemia 01/11/2018  . Intractable nausea and vomiting 12/25/2017  . Midline thoracic back pain 12/23/2017  . Malnutrition of moderate degree 12/16/2017  . Refractory nausea and vomiting 11/27/2017  . Effusion, right knee   . Encephalopathy   . Secondary hyperparathyroidism of renal origin ([place]) 01/15/2017  . Mitral valve annular calcification   . Constipation 10/15/2016  . Angina at rest [facility])   . Goals of care, counseling/discussion   . Metabolic acidosis 93/26/7124  . Aortic stenosis 05/21/2016  . Anemia associated with chronic renal failure 02/01/2016  . Atherosclerosis of aorta ([place]) 01/11/2015  . End stage renal disease ([place]) 02/04/2013  . Non-intractable vomiting with nausea 08/17/2012  . Glaucoma 03/18/2012  . Health care maintenance 09/17/2011  . Osteoarthrosis involving lower leg 10/31/2008  . History of CVA (cerebrovascular accident) 01/28/2008  . Hyperlipidemia 02/13/2007  . PULMONARY NODULES 01/12/2007  . Left ventricular hypertrophy 09/02/2006  . Essential hypertension 08/11/2006  . GERD 08/11/2006  . Diverticulitis of colon 08/11/2006    Past Surgical History:  Procedure Laterality Date  . ABDOMINAL HYSTERECTOMY    . AV FISTULA PLACEMENT Left 02/19/2017   Procedure: CREATION OF LEFT ARM BRACHIOCEPHALIC ARTERIOVENOUS (AV) FISTULA;  Surgeon: Rosetta Posner, MD;  Location: [facility] OR;  Service: Vascular;  Laterality: Left;  . BASCILIC VEIN TRANSPOSITION Left 04/23/2017   Procedure: BASILIC VEIN TRANSPOSITION SECOND STAGE;  Surgeon: Rosetta Posner, MD;  Location: [facility];  Service: Vascular;  Laterality: Left;  . CHOLECYSTECTOMY  2009  . COLONOSCOPY    . INGUINAL HERNIA REPAIR  2008  . INSERTION OF DIALYSIS CATHETER Right 02/19/2017   Procedure: INSERTION OF TUNNELED DIALYSIS CATHETER - RIGHT INTERNAL JUGULAR PLACEMENT;  Surgeon:  Rosetta Posner, MD;  Location: [place] [facility];  Service: Vascular;  Laterality: Right;  . IRIDOTOMY / IRIDECTOMY     Laser, right eye 12/26/11 left eye 01/24/12  . MASS EXCISION Left 05/07/2013   Procedure: EXCISION CYST;  Surgeon: Myrtha Mantis., MD;  Location: [place];  Service: Ophthalmology;  Laterality: Left;     OB History   None      Home Medications    Prior to Admission medications   Medication Sig Start Date End Date Taking? Authorizing Provider  acetaminophen (TYLENOL) 500 MG tablet Take 2 tablets (1,000 mg total) by mouth every 8 (eight) hours as needed for mild pain, moderate pain, fever or headache. 07/22/17   Colbert Ewing, MD  amLODipine (NORVASC) 5 MG tablet Take 1 tablet (5 mg total) by mouth daily. Patient not taking: Reported on 01/22/2018 01/14/18 02/13/18  [name], MD  aspirin EC 81 MG EC tablet Take 1 tablet (81 mg total) by mouth daily. 12/10/16   Valinda Party, DO  calcitRIOL (ROCALTROL) 0.5 MCG capsule Take 4 capsules (2 mcg total) by mouth every Monday, Wednesday, and Friday with hemodialysis. 12/30/17   Neva Seat, MD  calcium acetate (PHOSLO) 667 MG capsule Take 2 capsules (1,334 mg total) by mouth 3 (three) times daily with meals. 01/13/18 02/12/18  [name], MD  dextromethorphan-guaiFENesin (MUCINEX DM) 30-600 MG 12hr tablet Take 1 tablet by mouth 2 (two) times daily as needed for up to 30 doses for cough. 01/13/18   [name], MD  diclofenac sodium (VOLTAREN) 1 % GEL Apply 4 g topically 4 (four) times daily. 01/20/18   Mosetta Anis, MD  multivitamin (RENA-VIT) TABS tablet Take 1 tablet by mouth at bedtime. 01/13/18 02/12/18  [name], MD  nitroGLYCERIN (NITROSTAT) 0.3 MG SL tablet Place 1 tablet (0.3 mg total) under the tongue every 5 (five) minutes as needed for chest pain. 02/27/17   Sid Falcon, MD  pantoprazole (PROTONIX) 40 MG tablet Take 1 tablet (40 mg total) by mouth daily. 01/14/18 02/13/18  [name], MD    promethazine (PHENERGAN) 12.5 MG tablet Take 1 tablet (12.5 mg total) by mouth every 6 (six) hours as needed for nausea or vomiting (Take 1 prior to dialysis). 01/06/18   Molt, Bethany, DO  sucroferric oxyhydroxide (VELPHORO) 500 MG chewable tablet Chew 500 mg by mouth 3 (three) times daily with meals.    Rexene Agent, MD    Family History Family History  Problem Relation Age of Onset  . Hypertension Mother   . Congestive Heart Failure Mother   . Heart attack Brother 66    Social History Social History   Tobacco Use  . Smoking status: Never Smoker  . Smokeless tobacco: Never Used  Substance Use Topics  . Alcohol use: No    Alcohol/week: 0.0 oz  . Drug use: No    Comment: 08/15/08 UDS + cocaine     Allergies   Hydrocodone   Review of Systems Review of Systems  Constitutional: Positive for fatigue. Negative for appetite change, chills and fever.  HENT: Negative for ear pain, rhinorrhea, sneezing and sore throat.   Eyes: Negative for photophobia and visual disturbance.  Respiratory: Positive for shortness of breath. Negative for cough, chest tightness and wheezing.   Cardiovascular: Negative for chest pain and palpitations.  Gastrointestinal: Positive for abdominal pain, nausea and vomiting. Negative for blood in stool, constipation and diarrhea.  Genitourinary: Negative for dysuria, hematuria and urgency.  Musculoskeletal: Negative for myalgias.  Skin: Negative for rash.  Neurological: Negative for dizziness, weakness and light-headedness.     Physical Exam Updated Vital Signs BP (!) 177/78 (BP Location: Right Arm)   Pulse (!) 110   Temp 98.7 F (37.1 C) (Oral)   Resp 16   Ht 5\' 3"  (1.6 m)   Wt 60.3 kg (133 lb)   SpO2 98%   BMI 23.56 kg/m   Physical Exam  Constitutional: She appears well-developed and well-nourished. No distress.  Nontoxic-appearing and in no acute distress.  HENT:  Head: Normocephalic and atraumatic.  Nose: Nose normal.  Eyes:  Conjunctivae and EOM are normal. Left eye exhibits no discharge. No scleral icterus.  Neck: Normal range of motion. Neck supple.  Cardiovascular: Regular rhythm, normal heart sounds and intact distal pulses. Tachycardia present. Exam reveals no gallop and no friction rub.  No murmur heard. Pulmonary/Chest: Effort normal and breath sounds normal. No respiratory distress.  Abdominal: Soft. Bowel sounds are normal. She exhibits no distension. There is tenderness in the right lower quadrant, suprapubic area and left lower quadrant. There is no rebound and no guarding.  Musculoskeletal: Normal range of motion. She exhibits no edema.  Neurological: She is alert. She exhibits normal muscle tone. Coordination normal.  Skin: Skin is warm and dry. No rash noted.  Psychiatric: She has a normal mood and affect.  Nursing note and vitals reviewed.    ED Treatments / Results  Labs (all labs ordered are listed, but only abnormal results are displayed) Labs Reviewed  COMPREHENSIVE METABOLIC PANEL - Abnormal; Notable for the following components:      Result Value   BUN 40 (*)    Creatinine, Ser 8.28 (*)    Albumin 3.0 (*)    AST 13 (*)    GFR calc non Af Amer 4 (*)    GFR calc Af Amer 5 (*)    All other components within normal limits  CBC - Abnormal; Notable for the following components:   RBC 3.21 (*)    Hemoglobin 9.8 (*)    HCT 31.9 (*)    RDW 15.7 (*)    All other components within normal limits  URINALYSIS, ROUTINE W REFLEX MICROSCOPIC - Abnormal; Notable for the following components:   APPearance CLOUDY (*)    Protein, ur 100 (*)    Leukocytes, UA LARGE (*)    Bacteria, UA RARE (*)    Squamous Epithelial / LPF >50 (*)    All other components within normal limits  URINE CULTURE  LIPASE, BLOOD    EKG None  Radiology Ct Abdomen Pelvis Wo Contrast  Result Date: 01/25/2018 CLINICAL DATA:  Lower abdominal pain and vomiting for 2 days. EXAM: CT ABDOMEN AND PELVIS WITHOUT CONTRAST  TECHNIQUE: Multidetector CT imaging of the abdomen and pelvis was performed following the standard protocol without IV contrast. COMPARISON:  10/24/2017 FINDINGS: Lower chest: No acute findings. Hepatobiliary: No mass visualized on this unenhanced exam. Prior cholecystectomy. No evidence of biliary obstruction. Pancreas: No mass or inflammatory  process visualized on this unenhanced exam. Spleen:  Within normal limits in size. Adrenals/Urinary tract: Bilateral diffuse renal parenchymal atrophy again seen. No evidence of urolithiasis or hydronephrosis. Stomach/Bowel: No evidence of obstruction, inflammatory process, or abnormal fluid collections. Normal appendix visualized. Diverticulosis is seen mainly involving the descending and sigmoid colon, however there is no evidence of diverticulitis. Vascular/Lymphatic: No pathologically enlarged lymph nodes identified. No evidence of abdominal aortic aneurysm. Aortic atherosclerosis. Reproductive: Mildly enlarged uterus with several fibroids shows no significant change. Adnexal regions are unremarkable. Other:  None. Musculoskeletal:  No suspicious bone lesions identified. IMPRESSION: No acute findings. Colonic diverticulosis, without radiographic evidence of diverticulitis. Stable small uterine fibroids. Stable diffuse bilateral renal parenchymal atrophy. No evidence of hydronephrosis. Electronically Signed   By: Earle Gell M.D.   On: 01/25/2018 23:00    Procedures Procedures (including critical care time)  Medications Ordered in ED Medications  iopamidol (ISOVUE-300) 61 % injection (has no administration in time range)  cefTRIAXone (ROCEPHIN) 1 g in sodium chloride 0.9 % 100 mL IVPB (has no administration in time range)     Initial Impression / Assessment and Plan / ED Course  I have reviewed the triage vital signs and the nursing notes.  Pertinent labs & imaging results that were available during my care of the patient were reviewed by me and considered  in my medical decision making (see chart for details).     77 year old female with a past medical history of CHF, ESRD on dialysis MWF, prior CVA presents to ED for evaluation of 2-day history of lower abdominal pain, 3 episodes of nonbloody, nonbilious emesis.  Symptoms got worse this morning while at church.  Reports chronic shortness of breath.  Completed her dialysis on Friday but did have to end early due to feeling bad.  Denies any chest pain, fevers, injuries or falls.  On physical exam patient does have lower abdominal tenderness to palpation.  She is mildly tachycardic.  Lungs are clear to auscultation bilaterally.  Lab work significant for creatinine of 8 and BUN of 40.  Urinalysis shows evidence of UTI with leukocytes, bacteria, WBCs.  CT of the abdomen and pelvis without contrast is negative for acute abnormality.  Culture obtained.  CBC unremarkable.  Patient will need to be admitted for IV antibiotics for UTI and ongoing nausea and vomiting.  Will admit to internal medicine teaching service.   Portions of this note were generated with [employer]. Dictation errors may occur despite best attempts at proofreading.   Final Clinical Impressions(s) / ED Diagnoses   Final diagnoses:  None    ED Discharge Orders    None       Delia Heady, PA-C 01/25/18 2332    Tegeler, Gwenyth Allegra, MD 01/26/18 1320

## 2018-01-25 NOTE — ED Triage Notes (Signed)
Per EMS pt is from home.  Pt developed generalized weakness and abdominal pain this past Friday.  She states going to a doctor but can not say where she went to be seen.  Her symptoms continued and she decided to come to the hospital today.  States she is not diabetic CBG with EMS was 120.  Complains of 10/10 lower abdominal pain. Pt is on dialysis and is LA restricted (MWF).  AOx4 NAD noted at this time.

## 2018-01-25 NOTE — ED Notes (Signed)
Attempted to start iv unsuccessful at this time.

## 2018-01-26 ENCOUNTER — Other Ambulatory Visit: Payer: Self-pay

## 2018-01-26 ENCOUNTER — Telehealth: Payer: Self-pay | Admitting: Internal Medicine

## 2018-01-26 DIAGNOSIS — R112 Nausea with vomiting, unspecified: Secondary | ICD-10-CM

## 2018-01-26 DIAGNOSIS — Z8249 Family history of ischemic heart disease and other diseases of the circulatory system: Secondary | ICD-10-CM

## 2018-01-26 DIAGNOSIS — R011 Cardiac murmur, unspecified: Secondary | ICD-10-CM

## 2018-01-26 DIAGNOSIS — R1032 Left lower quadrant pain: Secondary | ICD-10-CM

## 2018-01-26 DIAGNOSIS — I132 Hypertensive heart and chronic kidney disease with heart failure and with stage 5 chronic kidney disease, or end stage renal disease: Secondary | ICD-10-CM

## 2018-01-26 DIAGNOSIS — R109 Unspecified abdominal pain: Secondary | ICD-10-CM | POA: Diagnosis present

## 2018-01-26 DIAGNOSIS — Z79899 Other long term (current) drug therapy: Secondary | ICD-10-CM

## 2018-01-26 DIAGNOSIS — I350 Nonrheumatic aortic (valve) stenosis: Secondary | ICD-10-CM

## 2018-01-26 DIAGNOSIS — R1031 Right lower quadrant pain: Secondary | ICD-10-CM | POA: Diagnosis not present

## 2018-01-26 DIAGNOSIS — K219 Gastro-esophageal reflux disease without esophagitis: Secondary | ICD-10-CM

## 2018-01-26 DIAGNOSIS — Z992 Dependence on renal dialysis: Secondary | ICD-10-CM

## 2018-01-26 DIAGNOSIS — I5030 Unspecified diastolic (congestive) heart failure: Secondary | ICD-10-CM

## 2018-01-26 DIAGNOSIS — Z885 Allergy status to narcotic agent status: Secondary | ICD-10-CM

## 2018-01-26 DIAGNOSIS — Z66 Do not resuscitate: Secondary | ICD-10-CM

## 2018-01-26 DIAGNOSIS — R531 Weakness: Secondary | ICD-10-CM

## 2018-01-26 DIAGNOSIS — D649 Anemia, unspecified: Secondary | ICD-10-CM

## 2018-01-26 DIAGNOSIS — N186 End stage renal disease: Secondary | ICD-10-CM

## 2018-01-26 DIAGNOSIS — N2581 Secondary hyperparathyroidism of renal origin: Secondary | ICD-10-CM

## 2018-01-26 DIAGNOSIS — D631 Anemia in chronic kidney disease: Secondary | ICD-10-CM

## 2018-01-26 DIAGNOSIS — R918 Other nonspecific abnormal finding of lung field: Secondary | ICD-10-CM

## 2018-01-26 LAB — CBC
HCT: 31.9 % — ABNORMAL LOW (ref 36.0–46.0)
Hemoglobin: 9.7 g/dL — ABNORMAL LOW (ref 12.0–15.0)
MCH: 30.3 pg (ref 26.0–34.0)
MCHC: 30.4 g/dL (ref 30.0–36.0)
MCV: 99.7 fL (ref 78.0–100.0)
Platelets: 183 10*3/uL (ref 150–400)
RBC: 3.2 MIL/uL — ABNORMAL LOW (ref 3.87–5.11)
RDW: 15.8 % — ABNORMAL HIGH (ref 11.5–15.5)
WBC: 5.5 10*3/uL (ref 4.0–10.5)

## 2018-01-26 LAB — RENAL FUNCTION PANEL
Albumin: 2.9 g/dL — ABNORMAL LOW (ref 3.5–5.0)
Anion gap: 16 — ABNORMAL HIGH (ref 5–15)
BUN: 42 mg/dL — ABNORMAL HIGH (ref 8–23)
CO2: 23 mmol/L (ref 22–32)
Calcium: 9.3 mg/dL (ref 8.9–10.3)
Chloride: 102 mmol/L (ref 98–111)
Creatinine, Ser: 8.44 mg/dL — ABNORMAL HIGH (ref 0.44–1.00)
GFR calc Af Amer: 5 mL/min — ABNORMAL LOW (ref >60)
GFR calc non Af Amer: 4 mL/min — ABNORMAL LOW (ref >60)
Glucose, Bld: 88 mg/dL (ref 70–99)
Phosphorus: 6.5 mg/dL — ABNORMAL HIGH (ref 2.5–4.6)
Potassium: 4.4 mmol/L (ref 3.5–5.1)
Sodium: 141 mmol/L (ref 135–145)

## 2018-01-26 LAB — TSH: TSH: 4.779 u[IU]/mL — ABNORMAL HIGH (ref 0.350–4.500)

## 2018-01-26 LAB — TROPONIN I

## 2018-01-26 MED ORDER — LIDOCAINE-PRILOCAINE 2.5-2.5 % EX CREA: 1.0000 "application " | TOPICAL_CREAM | CUTANEOUS | Status: DC | PRN

## 2018-01-26 MED ORDER — HEPARIN SODIUM (PORCINE) 5000 UNIT/ML IJ SOLN
5000.0000 [IU] | Freq: Three times a day (TID) | INTRAMUSCULAR | Status: DC
  Administered 2018-01-26: 5000 [IU] via SUBCUTANEOUS
  Filled 2018-01-26: qty 1

## 2018-01-26 MED ORDER — CALCITRIOL 0.5 MCG PO CAPS: 2.0000 ug | ORAL_CAPSULE | ORAL | Status: DC

## 2018-01-26 MED ORDER — LIDOCAINE HCL (PF) 1 % IJ SOLN
5.0000 mL | INTRAMUSCULAR | Status: DC | PRN
Start: 2018-01-26 — End: 2018-01-26

## 2018-01-26 MED ORDER — HEPARIN SODIUM (PORCINE) 1000 UNIT/ML DIALYSIS
1000.0000 [IU] | INTRAMUSCULAR | Status: DC | PRN
Start: 2018-01-26 — End: 2018-01-26

## 2018-01-26 MED ORDER — HEPARIN SODIUM (PORCINE) 1000 UNIT/ML DIALYSIS: 1000.0000 [IU] | INTRAMUSCULAR | Status: DC | PRN

## 2018-01-26 MED ORDER — PANTOPRAZOLE SODIUM 40 MG PO TBEC
40.0000 mg | DELAYED_RELEASE_TABLET | Freq: Every day | ORAL | Status: DC
  Administered 2018-01-26: 40 mg via ORAL
  Filled 2018-01-26: qty 1

## 2018-01-26 MED ORDER — AMLODIPINE BESYLATE 5 MG PO TABS
5.0000 mg | ORAL_TABLET | Freq: Every day | ORAL | Status: AC
  Administered 2018-01-26: 5 mg via ORAL
  Filled 2018-01-26: qty 1

## 2018-01-26 MED ORDER — SODIUM CHLORIDE 0.9 % IV SOLN: 100.0000 mL | INTRAVENOUS | Status: DC | PRN

## 2018-01-26 MED ORDER — PROMETHAZINE HCL 25 MG PO TABS: 12.5000 mg | ORAL_TABLET | Freq: Four times a day (QID) | ORAL | Status: DC | PRN

## 2018-01-26 MED ORDER — CYCLOBENZAPRINE HCL 10 MG PO TABS: 10.0000 mg | ORAL_TABLET | Freq: Once | ORAL | Status: DC

## 2018-01-26 MED ORDER — HEPARIN SODIUM (PORCINE) 1000 UNIT/ML DIALYSIS: 20.0000 [IU]/kg | INTRAMUSCULAR | Status: DC | PRN

## 2018-01-26 MED ORDER — ALTEPLASE 2 MG IJ SOLR: 2.0000 mg | Freq: Once | INTRAMUSCULAR | Status: DC | PRN

## 2018-01-26 MED ORDER — PENTAFLUOROPROP-TETRAFLUOROETH EX AERO: 1.0000 "application " | INHALATION_SPRAY | CUTANEOUS | Status: DC | PRN

## 2018-01-26 MED ORDER — LIDOCAINE HCL (PF) 1 % IJ SOLN: 5.0000 mL | INTRAMUSCULAR | Status: DC | PRN

## 2018-01-26 MED ORDER — PANTOPRAZOLE SODIUM 40 MG PO TBEC: 40.0000 mg | DELAYED_RELEASE_TABLET | Freq: Every day | ORAL | Status: DC

## 2018-01-26 MED ORDER — BENZONATATE 100 MG PO CAPS
100.0000 mg | ORAL_CAPSULE | Freq: Once | ORAL | Status: AC | PRN
  Administered 2018-01-26: 100 mg via ORAL
  Filled 2018-01-26: qty 1

## 2018-01-26 MED ORDER — AMLODIPINE BESYLATE 5 MG PO TABS
5.0000 mg | ORAL_TABLET | Freq: Every day | ORAL | Status: DC
  Administered 2018-01-26: 5 mg via ORAL
  Filled 2018-01-26: qty 1

## 2018-01-26 MED ORDER — ACETAMINOPHEN 500 MG PO TABS: 1000.0000 mg | ORAL_TABLET | Freq: Three times a day (TID) | ORAL | Status: DC | PRN

## 2018-01-26 MED ORDER — SUCROFERRIC OXYHYDROXIDE 500 MG PO CHEW
500.0000 mg | CHEWABLE_TABLET | Freq: Three times a day (TID) | ORAL | Status: DC
  Filled 2018-01-26: qty 1

## 2018-01-26 MED ORDER — RENA-VITE PO TABS: 1.0000 | ORAL_TABLET | Freq: Every day | ORAL | Status: DC

## 2018-01-26 MED ORDER — DICLOFENAC SODIUM 1 % TD GEL
4.0000 g | Freq: Three times a day (TID) | TRANSDERMAL | Status: DC
  Administered 2018-01-26: 4 g via TOPICAL
  Filled 2018-01-26: qty 100

## 2018-01-26 MED ORDER — CHLORHEXIDINE GLUCONATE CLOTH 2 % EX PADS
6.0000 | MEDICATED_PAD | Freq: Every day | CUTANEOUS | Status: DC
  Administered 2018-01-26: 6 via TOPICAL

## 2018-01-26 MED ORDER — ASPIRIN EC 81 MG PO TBEC
81.0000 mg | DELAYED_RELEASE_TABLET | Freq: Every day | ORAL | Status: DC
  Administered 2018-01-26: 81 mg via ORAL
  Filled 2018-01-26: qty 1

## 2018-01-26 NOTE — Discharge Summary (Addendum)
Name: Nancy [name] MRN: [id number] DOB: June 30, 1941 77 y.o. PCP: Aldine Contes, MD  Date of Admission: 01/25/2018  7:04 PM Date of Discharge: 01/26/18 Attending Physician: No att. providers found  Discharge Diagnosis: 1. Persistent nausea / vomiting  2. ESRD on MWF HD   Discharge Medications: Allergies as of 01/26/2018      Reactions   Hydrocodone Nausea And Vomiting, Other (See Comments)   dizziness      Medication List    TAKE these medications   acetaminophen 500 MG tablet Commonly known as:  TYLENOL Take 2 tablets (1,000 mg total) by mouth every 8 (eight) hours as needed for mild pain, moderate pain, fever or headache.   amLODipine 5 MG tablet Commonly known as:  NORVASC Take 1 tablet (5 mg total) by mouth daily.   aspirin 81 MG EC tablet Take 1 tablet (81 mg total) by mouth daily.   calcitRIOL 0.5 MCG capsule Commonly known as:  ROCALTROL Take 4 capsules (2 mcg total) by mouth every Monday, Wednesday, and Friday with hemodialysis.   calcium acetate 667 MG capsule Commonly known as:  PHOSLO Take 2 capsules (1,334 mg total) by mouth 3 (three) times daily with meals.   dextromethorphan-guaiFENesin 30-600 MG 12hr tablet Commonly known as:  MUCINEX DM Take 1 tablet by mouth 2 (two) times daily as needed for up to 30 doses for cough.   diclofenac sodium 1 % Gel Commonly known as:  VOLTAREN Apply 4 g topically 4 (four) times daily. What changed:    when to take this  reasons to take this   multivitamin Tabs tablet Take 1 tablet by mouth at bedtime.   nitroGLYCERIN 0.3 MG SL tablet Commonly known as:  NITROSTAT Place 1 tablet (0.3 mg total) under the tongue every 5 (five) minutes as needed for chest pain.   pantoprazole 40 MG tablet Commonly known as:  PROTONIX Take 1 tablet (40 mg total) by mouth daily.   promethazine 12.5 MG tablet Commonly known as:  PHENERGAN Take 1 tablet (12.5 mg total) by mouth every 6 (six) hours as needed for nausea or  vomiting (Take 1 prior to dialysis).   VELPHORO 500 MG chewable tablet Generic drug:  sucroferric oxyhydroxide Chew 500 mg by mouth 3 (three) times daily with meals.       Disposition and follow-up:   Nancy [name] was discharged from [facility] in Stable condition.  At the hospital follow up visit please address:  1.  Please FU compliance with MWF HD and making sure pt is taking Phenergan prior to HD sessions. Please FU workup for hypothyroidism. Pts TSH was mildly elevated to 4.78. Pt not on Synthroid currently.   2.  Labs / imaging needed at time of follow-up: NA  3.  Pending labs/ test needing follow-up: NA  Follow-up Appointments: Follow-up Information    Carol Ada, MD. Go to.   Specialty:  Gastroenterology Why:  Please go to your appointment with Dr. Benson Norway on July 29th at 3:15pm. Please [place] with you to your appointment. Contact information: [place], [place] [address] [place]. Go to.   Why:  Please go to your appointment on July 30 at 2:15pm. Contact information: [address] [place] [phone]        Please go to your appointment with Dr. Carol Ada at [facility] on July 29 at 3:15pm  after your morning dialysis session. Please go to your appointment at the [facility] Medicine Clinic on July 30 at 2:15pm.   Hospital Course by problem list: 1. Persistent nausea / vomiting Pt presented the night of 7/21 with symptoms consistent with her previous hospital presentations; 2 day hx of abdominal pain, nausea/vomiting, anorexia, and headache which started after an episode of dialysis on Friday 7/19. Abdominal CT was negative for acute pathology. In the ED pt was thought to have a UTI d/t leukocytes on UA and was unfortunately given 1 dose of ceftriaxone prior to inpatient admission. Pt denied symptoms of dysuria,  polyuria, and had no suprapubic tenderness concerning for UTI so ceftriaxone was not continued, although we will follow up urine culture. Pt had abdominal CT which was negative for acute process. Pt has previously been managed with prophylactic Phenergan prior to HD session which sometimes helps however n/v has been persistent and per nephrology is not thought to be related to dialysis. Nausea vomiting could be a side effect of phosphate binders, however pt is not taking her binders. Pt often leaves dialysis early without returning to dry weight. Pts nausea / vomiting could be 2/2 chronic uremia d/t poor compliance with dialysis, however pt does get 1-2 full session per week which should prevent this. Pt was managed with supportive care and was feeling better by the next morning of hospital stay. Pt has had 7 admissions in the past 3 months for intractable nausea / vomiting and after speaking with nephrology, this does not appear to be dialysis related.  Pt has not been seen by GI since 2015. We will send patient for outpatient GI follow up for persistent nausea / vomiting.  2. ESRD on MWF HD Last HD 7/19 , which was stopped short. Pt presented with anion gap metabolic acidosis 2/2 uremia. BUN 42. Nephrology was consulted and pt received HD on Monday 7/22 prior to discharge. Pt to continue with normal MWF schedule outpatient.   Discharge Vitals:   BP (!) 154/75 (BP Location: Right Arm)   Pulse (!) 101   Temp 98.4 F (36.9 C) (Oral)   Resp 18   Ht 5\' 3"  (1.6 m)   Wt 60.7 kg (133 lb 13.1 oz) Comment: stood to scale   SpO2 99%   BMI 23.71 kg/m   Pertinent Labs, Studies, and Procedures:  BUN: 40 --> 42 Cr: 8.28 --> 8.44 Hgb: 9.8 --> 9.7  TSH 4.78  Urine Culture - contaminated multiple species  Abdominal CT 01/25/18: No acute findings. Colonic diverticulosis, without radiographic evidence of Diverticulitis. Stable small uterine fibroids. Stable diffuse bilateral renal parenchymal atrophy. No  evidence of Hydronephrosis.    Discharge Instructions: Discharge Instructions    Diet - low sodium heart healthy   Complete by:  As directed    Discharge instructions   Complete by:  As directed    Ms. Calderone, you were in the hospital for nausea and vomiting. Please continue going to your dialysis sessions and try to stay for the entire time. Please go to your appointment with the GI specialists so try to figure out why you are having nausea and vomiting.   Increase activity slowly   Complete by:  As directed      Ms. Garling, you were in the hospital for nausea and vomiting. Please continue going to your dialysis sessions and try to stay for the entire time. Please go to your appointment with the GI specialists so try to figure out why you are having  nausea and vomiting.   Signed: Christin Bach, Medical Student 01/27/2018, 10:07 AM   Pager: [phone]  Attestation for Student Documentation:  I personally was present and performed or re-performed the history, physical exam and medical decision-making activities of this service and have verified that the service and findings are accurately documented in the student's note.  [name], MD 01/27/2018, 5:34 PM

## 2018-01-26 NOTE — Consult Note (Addendum)
[facility] Renal Consultation Note    Indication for Consultation:  Management of ESRD/hemodialysis; anemia, hypertension/volume and secondary hyperparathyroidism  PCP:Narendra, Nischal, MD  HPI: Nancy [name] is a 77 y.o. female. ESRD 2/2 HTN on HD MWF at [facility], first starting 02/2017.  Past medical history significant for HTN, Hx CVA, aortic stenosis, HrEF, and GERD.  OF note patient was recently admitted from 7/7-01/12/18 due to abdominal pain and diarrhea, GI workup was unremarkable and her symptoms were thought to be due to viral gastroenteritis.  Patient has been admitted for further evaluation and management of abdominal pain, nausea and vomiting.  Patient seen and examined at bedside.  Reports presenting to the ED due to nausea and vomiting starting 3 days ago.  States she also has had a cough, productive of whitish sputum.  Admits to nausea this AM after eating part of her breakfast.  States she has been signing off of HD early due to feeling sick after about 3hrs of treatment but states she completed her full treatment on Friday.  Denies CP, SOB, weakness, dizziness, diarrhea and edema.  Pertinent findings during this hospitalization include hypertension, and CT abdomen with no acute findings.    Of note OP dialysis records show she has only completed one full treatment since her last hospitalization, otherwise signing off after 2-3.5 hours.  She has not met her dry weight in the last 2 weeks and adequacy is not to goal likely due to shortened treatments.  Patient family states she has not been taking binders regularly because she does not like them.  Past Medical History:  Diagnosis Date  . Anemia   . Aortic stenosis   . Bacterial sinusitis 09/17/2011  . CHF (congestive heart failure) (HCC)   . CKD (chronic kidney disease) stage 4, GFR 15-29 ml/min (HCC) 08/11/2006   Cr continues to increase. Proteinuria on UA 02/10/12.    . Colitis   . CVA (cerebrovascular accident)  (HCC)    New hemorrhagic per CT scan '09  . Diverticulosis of colon   . Dysfunctional uterine bleeding   . ESRD (end stage renal disease) on dialysis (HCC)    "MWF; [facility]" (11/27/2017)  . Fecal impaction (HCC)   . Headache(784.0)   . Heart murmur   . HERNIORRHAPHY, HX OF 08/11/2006  . Hypertension   . OA (osteoarthritis)    bilateral knees  . Postmenopausal   . Pulmonary nodule   . TINEA CRURIS 01/12/2007   Past Surgical History:  Procedure Laterality Date  . ABDOMINAL HYSTERECTOMY    . AV FISTULA PLACEMENT Left 02/19/2017   Procedure: CREATION OF LEFT ARM BRACHIOCEPHALIC ARTERIOVENOUS (AV) FISTULA;  Surgeon: Early, Todd F, MD;  Location: [facility] OR;  Service: Vascular;  Laterality: Left;  . BASCILIC VEIN TRANSPOSITION Left 04/23/2017   Procedure: BASILIC VEIN TRANSPOSITION SECOND STAGE;  Surgeon: Early, Todd F, MD;  Location: [facility] OR;  Service: Vascular;  Laterality: Left;  . CHOLECYSTECTOMY  2009  . COLONOSCOPY    . INGUINAL HERNIA REPAIR  2008  . INSERTION OF DIALYSIS CATHETER Right 02/19/2017   Procedure: INSERTION OF TUNNELED DIALYSIS CATHETER - RIGHT INTERNAL JUGULAR PLACEMENT;  Surgeon: Early, Todd F, MD;  Location: [facility] OR;  Service: Vascular;  Laterality: Right;  . IRIDOTOMY / IRIDECTOMY     Laser, right eye 12/26/11 left eye 01/24/12  . MASS EXCISION Left 05/07/2013   Procedure: EXCISION CYST;  Surgeon: Thomas E Brewington Jr., MD;  Location: [facility];  Service: Ophthalmology;    Laterality: Left;   Family History  Problem Relation Age of Onset  . Hypertension Mother   . Congestive Heart Failure Mother   . Heart attack Brother 67   Social History:  reports that she has never smoked. She has never used smokeless tobacco. She reports that she does not drink alcohol or use drugs. Allergies  Allergen Reactions  . Hydrocodone Nausea And Vomiting and Other (See Comments)    dizziness   Prior to Admission medications   Medication Sig Start Date End Date Taking?  Authorizing Provider  acetaminophen (TYLENOL) 500 MG tablet Take 2 tablets (1,000 mg total) by mouth every 8 (eight) hours as needed for mild pain, moderate pain, fever or headache. 07/22/17  Yes Colbert Ewing, MD  amLODipine (NORVASC) 5 MG tablet Take 1 tablet (5 mg total) by mouth daily. 01/14/18 02/13/18 Yes [name], MD  aspirin EC 81 MG EC tablet Take 1 tablet (81 mg total) by mouth daily. 12/10/16  Yes Valinda Party, DO  calcitRIOL (ROCALTROL) 0.5 MCG capsule Take 4 capsules (2 mcg total) by mouth every Monday, Wednesday, and Friday with hemodialysis. 12/30/17  Yes Neva Seat, MD  dextromethorphan-guaiFENesin [facility] DM) 30-600 MG 12hr tablet Take 1 tablet by mouth 2 (two) times daily as needed for up to 30 doses for cough. 01/13/18  Yes [name], MD  diclofenac sodium (VOLTAREN) 1 % GEL Apply 4 g topically 4 (four) times daily. Patient taking differently: Apply 4 g topically 4 (four) times daily as needed (pain).  01/20/18  Yes Mosetta Anis, MD  multivitamin (RENA-VIT) TABS tablet Take 1 tablet by mouth at bedtime. 01/13/18 02/12/18 Yes [name], MD  nitroGLYCERIN (NITROSTAT) 0.3 MG SL tablet Place 1 tablet (0.3 mg total) under the tongue every 5 (five) minutes as needed for chest pain. 02/27/17  Yes Sid Falcon, MD  pantoprazole (PROTONIX) 40 MG tablet Take 1 tablet (40 mg total) by mouth daily. 01/14/18 02/13/18 Yes [name], MD  promethazine (PHENERGAN) 12.5 MG tablet Take 1 tablet (12.5 mg total) by mouth every 6 (six) hours as needed for nausea or vomiting (Take 1 prior to dialysis). 01/06/18  Yes Molt, Bethany, DO  sucroferric oxyhydroxide (VELPHORO) 500 MG chewable tablet Chew 500 mg by mouth 3 (three) times daily with meals.   Yes Rexene Agent, MD  calcium acetate (PHOSLO) 667 MG capsule Take 2 capsules (1,334 mg total) by mouth 3 (three) times daily with meals. Patient not taking: Reported on 01/25/2018 01/13/18 02/12/18  [name], MD   Current  Facility-Administered Medications  Medication Dose Route Frequency Provider Last Rate Last Dose  . 0.9 %  sodium chloride infusion   Intravenous PRN Ledell Noss, MD 10 mL/hr at 01/25/18 2325    . 0.9 %  sodium chloride infusion  100 mL Intravenous PRN Alric Seton, PA-C      . 0.9 %  sodium chloride infusion  100 mL Intravenous PRN Alric Seton, PA-C      . acetaminophen (TYLENOL) tablet 1,000 mg  1,000 mg Oral Q8H PRN Ledell Noss, MD      . alteplase (CATHFLO ACTIVASE) injection 2 mg  2 mg Intracatheter Once PRN Alric Seton, PA-C      . amLODipine (NORVASC) tablet 5 mg  5 mg Oral Daily Ledell Noss, MD   5 mg at 01/26/18 1005  . aspirin EC tablet 81 mg  81 mg Oral Daily Ledell Noss, MD   81 mg at 01/26/18 1005  . calcitRIOL (ROCALTROL)  capsule 2 mcg  2 mcg Oral Q M,W,F-HD Blum, Nina, MD      . Chlorhexidine Gluconate Cloth 2 % PADS 6 each  6 each Topical Q0600 , , MD   6 each at 01/26/18 1058  . diclofenac sodium (VOLTAREN) 1 % transdermal gel 4 g  4 g Topical TID AC & HS Amin, Sumayya, MD   4 g at 01/26/18 0815  . heparin injection 1,000 Units  1,000 Units Dialysis PRN Bergman, Martha, PA-C      . heparin injection 1,200 Units  20 Units/kg Dialysis PRN Bergman, Martha, PA-C      . heparin injection 5,000 Units  5,000 Units Subcutaneous Q8H Blum, Nina, MD   5,000 Units at 01/26/18 0650  . lidocaine (PF) (XYLOCAINE) 1 % injection 5 mL  5 mL Intradermal PRN Bergman, Martha, PA-C      . lidocaine-prilocaine (EMLA) cream 1 application  1 application Topical PRN Bergman, Martha, PA-C      . multivitamin (RENA-VIT) tablet 1 tablet  1 tablet Oral QHS Blum, Nina, MD      . pantoprazole (PROTONIX) EC tablet 40 mg  40 mg Oral Daily Blum, Nina, MD   40 mg at 01/26/18 0721  . pentafluoroprop-tetrafluoroeth (GEBAUERS) aerosol 1 application  1 application Topical PRN Bergman, Martha, PA-C      . promethazine (PHENERGAN) tablet 12.5 mg  12.5 mg Oral Q6H PRN Blum, Nina, MD       Labs: Basic  Metabolic Panel: Recent Labs  Lab 01/25/18 2030 01/26/18 0221  NA 141 141  K 4.3 4.4  CL 101 102  CO2 27 23  GLUCOSE 98 88  BUN 40* 42*  CREATININE 8.28* 8.44*  CALCIUM 9.8 9.3  PHOS  --  6.5*   Liver Function Tests: Recent Labs  Lab 01/25/18 2030 01/26/18 0221  AST 13*  --   ALT 8  --   ALKPHOS 43  --   BILITOT 0.6  --   PROT 6.5  --   ALBUMIN 3.0* 2.9*   Recent Labs  Lab 01/25/18 2030  LIPASE 44   CBC: Recent Labs  Lab 01/25/18 2030 01/26/18 0221  WBC 5.1 5.5  HGB 9.8* 9.7*  HCT 31.9* 31.9*  MCV 99.4 99.7  PLT 181 183   Cardiac Enzymes: Recent Labs  Lab 01/26/18 0221  TROPONINI <0.03   Studies/Results: Ct Abdomen Pelvis Wo Contrast  Result Date: 01/25/2018 CLINICAL DATA:  Lower abdominal pain and vomiting for 2 days. EXAM: CT ABDOMEN AND PELVIS WITHOUT CONTRAST TECHNIQUE: Multidetector CT imaging of the abdomen and pelvis was performed following the standard protocol without IV contrast. COMPARISON:  10/24/2017 FINDINGS: Lower chest: No acute findings. Hepatobiliary: No mass visualized on this unenhanced exam. Prior cholecystectomy. No evidence of biliary obstruction. Pancreas: No mass or inflammatory process visualized on this unenhanced exam. Spleen:  Within normal limits in size. Adrenals/Urinary tract: Bilateral diffuse renal parenchymal atrophy again seen. No evidence of urolithiasis or hydronephrosis. Stomach/Bowel: No evidence of obstruction, inflammatory process, or abnormal fluid collections. Normal appendix visualized. Diverticulosis is seen mainly involving the descending and sigmoid colon, however there is no evidence of diverticulitis. Vascular/Lymphatic: No pathologically enlarged lymph nodes identified. No evidence of abdominal aortic aneurysm. Aortic atherosclerosis. Reproductive: Mildly enlarged uterus with several fibroids shows no significant change. Adnexal regions are unremarkable. Other:  None. Musculoskeletal:  No suspicious bone lesions  identified. IMPRESSION: No acute findings. Colonic diverticulosis, without radiographic evidence of diverticulitis. Stable small uterine fibroids. Stable diffuse bilateral renal   parenchymal atrophy. No evidence of hydronephrosis. Electronically Signed   By: John  Stahl M.D.   On: 01/25/2018 23:00    ROS: All others negative except those listed in HPI.  Physical Exam: Vitals:   01/26/18 0116 01/26/18 0340 01/26/18 0420 01/26/18 0816  BP: (!) 182/88 (!) 173/86 (!) 177/87 (!) 161/87  Pulse: (!) 111  (!) 111 (!) 107  Resp: 18  18 18  Temp: 98.4 F (36.9 C)  98.1 F (36.7 C) 98.1 F (36.7 C)  TempSrc: Oral  Oral Oral  SpO2: 100%  94% 95%  Weight: 61.1 kg (134 lb 11.2 oz)     Height:         General: WDWN, NAD, thin, chronically ill appearing female Head: NCAT sclera not icteric MMM Neck: Supple. No lymphadenopathy Lungs: CTA bilaterally. No wheeze, rales or rhonchi. Breathing is unlabored. Heart: RRR. +3/6 blowing systolic murmur, no rubs or gallops.  Abdomen: soft, +tender in b/l lower quadrants, +BS, no guarding, no rebound tenderness Lower extremities:no edema, ischemic changes, or open wounds  Neuro: AAOx3. Moves all extremities spontaneously. Psych:  Responds to questions appropriately with a normal affect. Dialysis Access: LU AVF +b/t, dry scaly skin over fistula  Dialysis Orders:  MWF - [facility]  4hrs, BFR 300, DFR 800,  EDW 58kg,  2K/ 2.5Ca  Access: LU AVF  Heparin 3000 Unit bolus Mircera 150 mcg q2wks - last 01/21/18 Calcitriol 2 mcg PO qHD Velphoro 500mg tablet - 1 TID  Assessment/Plan: 1.  Abdominal pain, N/V - source unclear.  Possible uremic symptoms due to non compliance.  Ongiong nausea this AM, not getting binders while admitted so doubtful it is related.  Recent GI workup unremarkable. Per primary.  Having late HD negative symptoms prompting signing off early about 50% of her treatments.  Possible she has some uremic symptoms but difficult to prove, would  investigate other possible causes as well.  wil continue to hold phos binder for now.  2.  ESRD -  HD MWF, orders written for HD today per regular schedule. K 4.4. 3.  Hypertension/volume  - BP elevated, expect improvement post HD.  On amlodipine.  Does not appear volume overloaded on exam, titrate down volume as tolerated.  4.  Anemia of CKD - Hgb 9.7.  Recent dosed with ESA. Follow trends. 5.  Secondary Hyperparathyroidism -  Ca in goal.  Phos 6.5. Restart binders. Continue VDRA.  6.  Nutrition - Renal diet with fluid restrictions. Renavite.  7. GERD - per primary  Lindsay Penninger, PA-C [facility] Pager: [phone] 01/26/2018, 12:09 PM   Pt seen, examined, agree w assess/plan as above with additions as indicated.  Rob  MD [facility] pager [phone]    cell [phone] 01/26/2018, 1:15 PM     

## 2018-01-26 NOTE — Plan of Care (Signed)
  Problem: Health Behavior/Discharge Planning: Goal: Ability to manage health-related needs will improve Outcome: Progressing   Problem: Clinical Measurements: Goal: Ability to maintain clinical measurements within normal limits will improve Outcome: Progressing   Problem: Clinical Measurements: Goal: Ability to maintain clinical measurements within normal limits will improve Outcome: Progressing   Problem: Health Behavior/Discharge Planning: Goal: Ability to manage health-related needs will improve Outcome: Progressing   Problem: Clinical Measurements: Goal: Ability to maintain clinical measurements within normal limits will improve Outcome: Progressing

## 2018-01-26 NOTE — Progress Notes (Addendum)
   Subjective: Pt states she is feeling better this morning. Denies any abdominal pain. Denies nausea. Denies SOB. She endorses her chronic cough occasionally productive of white mucus. Pt states she has not been taking her Protonix since last admission because she is "waiting on social services."  Objective:  Vital signs in last 24 hours: Vitals:   01/26/18 0116 01/26/18 0340 01/26/18 0420 01/26/18 0816  BP: (!) 182/88 (!) 173/86 (!) 177/87 (!) 161/87  Pulse: (!) 111  (!) 111 (!) 107  Resp: 18  18 18   Temp: 98.4 F (36.9 C)  98.1 F (36.7 C) 98.1 F (36.7 C)  TempSrc: Oral  Oral Oral  SpO2: 100%  94% 95%  Weight: 61.1 kg (134 lb 11.2 oz)     Height:       Physical Exam: Gen: comfortable, NAD HEENT: EOMI, MMM Cardio: RRR, 3/6 systolic murmur heard most at the R sternal border Resp: LCAB, no increased WOB Extremities: warm, no LE pitting edema   Assessment/Plan: This is a 77 year old female with a PMH of ESRD on M/W/F HD, aortic stenosis with calcific vegetative mass, anemia, HFpEF (last EF 65-70%), HTN, GERD, secondary hyperparathyroidism 2/2 renal disease, and benign pulmonary nodules who has had multiple hospitalizations for nausea, vomiting presenting today with symptoms consistent with typical presentation; 2 day hx of abdominal pain and nausea/vomiting starting after an episode of dialysis on Friday 7/19.  #nausea / vomiting -presentation consistent with previous admissions  -non-bloody stool and emesis -last admission, stool studies including GI panel, O&P Plan:  -continue Phenergan PRN prior to dialysis MWF and q6h PRN n/v -nephrology consult considering changing phosphate binders since that may cause GI side effects -per nephrology this is NOT consistent with dialysis disequilibrium syndrome  #ESRD -anion gap metabolic acidosis 2/2 uremia - BUN 42 Plan: -continue MWF dialysis per nephrology, plan for HD today 7/22  #Anemia 2/2 renal disease -monitor Hgb -  stable  #HTN -continue home amlodipine 5mg  po qd   #GERD -chronic cough Plan: -continue Protonix 40mg  daily -continue tessalon pearls PRN cough  #DVT Ppx: heparin  #Dispo: Inpatient admission with expected length of stay <24 hrs.    Zafira Munos, Earnest Conroy, Medical Student 01/26/2018, 9:53 AM  Pager [phone]

## 2018-01-26 NOTE — Progress Notes (Signed)
Patient discharged to home. Patient AVS reviewed and signed. Patient capable re-verbalizing medications and follow-up appointments. IV removed. Patient belongings sent with patient. Patient educated to return to the ED in the event of SOB, chest pain or dizziness.   Christabell Loseke B. RN 

## 2018-01-26 NOTE — Telephone Encounter (Signed)
Hospital f/u per Dr Reesa Chew; pt appt 02/03/2018 245p/NW

## 2018-01-27 ENCOUNTER — Emergency Department (HOSPITAL_COMMUNITY): Payer: Medicare Other

## 2018-01-27 ENCOUNTER — Observation Stay (HOSPITAL_COMMUNITY)
Admission: EM | Admit: 2018-01-27 | Discharge: 2018-02-01 | Disposition: A | Discharge status: Home / Self Care | Payer: Medicare Other | Attending: Internal Medicine | Admitting: Internal Medicine

## 2018-01-27 ENCOUNTER — Encounter (HOSPITAL_COMMUNITY): Payer: Self-pay | Admitting: *Deleted

## 2018-01-27 ENCOUNTER — Other Ambulatory Visit: Payer: Self-pay | Admitting: Pharmacist

## 2018-01-27 ENCOUNTER — Other Ambulatory Visit: Payer: Self-pay

## 2018-01-27 ENCOUNTER — Ambulatory Visit: Payer: Self-pay

## 2018-01-27 ENCOUNTER — Telehealth: Payer: Self-pay | Admitting: Internal Medicine

## 2018-01-27 DIAGNOSIS — Z6821 Body mass index (BMI) 21.0-21.9, adult: Secondary | ICD-10-CM | POA: Diagnosis not present

## 2018-01-27 DIAGNOSIS — K449 Diaphragmatic hernia without obstruction or gangrene: Secondary | ICD-10-CM | POA: Diagnosis not present

## 2018-01-27 DIAGNOSIS — Z791 Long term (current) use of non-steroidal anti-inflammatories (NSAID): Secondary | ICD-10-CM | POA: Diagnosis not present

## 2018-01-27 DIAGNOSIS — R630 Anorexia: Secondary | ICD-10-CM | POA: Insufficient documentation

## 2018-01-27 DIAGNOSIS — N390 Urinary tract infection, site not specified: Secondary | ICD-10-CM | POA: Insufficient documentation

## 2018-01-27 DIAGNOSIS — R079 Chest pain, unspecified: Secondary | ICD-10-CM | POA: Insufficient documentation

## 2018-01-27 DIAGNOSIS — D631 Anemia in chronic kidney disease: Secondary | ICD-10-CM | POA: Insufficient documentation

## 2018-01-27 DIAGNOSIS — M1990 Unspecified osteoarthritis, unspecified site: Secondary | ICD-10-CM | POA: Insufficient documentation

## 2018-01-27 DIAGNOSIS — I272 Pulmonary hypertension, unspecified: Secondary | ICD-10-CM | POA: Diagnosis not present

## 2018-01-27 DIAGNOSIS — Z79899 Other long term (current) drug therapy: Secondary | ICD-10-CM | POA: Diagnosis not present

## 2018-01-27 DIAGNOSIS — R42 Dizziness and giddiness: Secondary | ICD-10-CM | POA: Diagnosis not present

## 2018-01-27 DIAGNOSIS — I700 Atherosclerosis of aorta: Secondary | ICD-10-CM | POA: Diagnosis not present

## 2018-01-27 DIAGNOSIS — N2581 Secondary hyperparathyroidism of renal origin: Secondary | ICD-10-CM | POA: Diagnosis not present

## 2018-01-27 DIAGNOSIS — I132 Hypertensive heart and chronic kidney disease with heart failure and with stage 5 chronic kidney disease, or end stage renal disease: Secondary | ICD-10-CM | POA: Diagnosis not present

## 2018-01-27 DIAGNOSIS — R0602 Shortness of breath: Secondary | ICD-10-CM | POA: Diagnosis not present

## 2018-01-27 DIAGNOSIS — Z992 Dependence on renal dialysis: Secondary | ICD-10-CM | POA: Diagnosis not present

## 2018-01-27 DIAGNOSIS — R0609 Other forms of dyspnea: Secondary | ICD-10-CM | POA: Diagnosis present

## 2018-01-27 DIAGNOSIS — Z66 Do not resuscitate: Secondary | ICD-10-CM | POA: Insufficient documentation

## 2018-01-27 DIAGNOSIS — N186 End stage renal disease: Secondary | ICD-10-CM | POA: Diagnosis not present

## 2018-01-27 DIAGNOSIS — R112 Nausea with vomiting, unspecified: Secondary | ICD-10-CM | POA: Diagnosis not present

## 2018-01-27 DIAGNOSIS — E43 Unspecified severe protein-calorie malnutrition: Secondary | ICD-10-CM | POA: Insufficient documentation

## 2018-01-27 DIAGNOSIS — K219 Gastro-esophageal reflux disease without esophagitis: Secondary | ICD-10-CM | POA: Diagnosis not present

## 2018-01-27 DIAGNOSIS — R0601 Orthopnea: Secondary | ICD-10-CM

## 2018-01-27 DIAGNOSIS — Z885 Allergy status to narcotic agent status: Secondary | ICD-10-CM | POA: Insufficient documentation

## 2018-01-27 DIAGNOSIS — I350 Nonrheumatic aortic (valve) stenosis: Secondary | ICD-10-CM | POA: Diagnosis not present

## 2018-01-27 DIAGNOSIS — I5032 Chronic diastolic (congestive) heart failure: Secondary | ICD-10-CM | POA: Insufficient documentation

## 2018-01-27 DIAGNOSIS — E8770 Fluid overload, unspecified: Secondary | ICD-10-CM | POA: Diagnosis present

## 2018-01-27 DIAGNOSIS — R0789 Other chest pain: Secondary | ICD-10-CM | POA: Diagnosis not present

## 2018-01-27 DIAGNOSIS — Z8673 Personal history of transient ischemic attack (TIA), and cerebral infarction without residual deficits: Secondary | ICD-10-CM | POA: Insufficient documentation

## 2018-01-27 DIAGNOSIS — Z8249 Family history of ischemic heart disease and other diseases of the circulatory system: Secondary | ICD-10-CM | POA: Insufficient documentation

## 2018-01-27 DIAGNOSIS — Z9049 Acquired absence of other specified parts of digestive tract: Secondary | ICD-10-CM | POA: Insufficient documentation

## 2018-01-27 DIAGNOSIS — Z515 Encounter for palliative care: Secondary | ICD-10-CM

## 2018-01-27 DIAGNOSIS — I10 Essential (primary) hypertension: Secondary | ICD-10-CM | POA: Diagnosis not present

## 2018-01-27 DIAGNOSIS — J90 Pleural effusion, not elsewhere classified: Secondary | ICD-10-CM | POA: Insufficient documentation

## 2018-01-27 DIAGNOSIS — Z7982 Long term (current) use of aspirin: Secondary | ICD-10-CM | POA: Diagnosis not present

## 2018-01-27 DIAGNOSIS — R0902 Hypoxemia: Secondary | ICD-10-CM | POA: Diagnosis not present

## 2018-01-27 LAB — CBC
HEMATOCRIT: 35.5 % — AB (ref 36.0–46.0)
HEMOGLOBIN: 10.7 g/dL — AB (ref 12.0–15.0)
MCH: 29.8 pg (ref 26.0–34.0)
MCHC: 30.1 g/dL (ref 30.0–36.0)
MCV: 98.9 fL (ref 78.0–100.0)
Platelets: 183 10*3/uL (ref 150–400)
RBC: 3.59 MIL/uL — ABNORMAL LOW (ref 3.87–5.11)
RDW: 15.6 % — ABNORMAL HIGH (ref 11.5–15.5)
WBC: 4.6 10*3/uL (ref 4.0–10.5)

## 2018-01-27 LAB — D-DIMER, QUANTITATIVE: D-Dimer, Quant: 1.58 ug/mL-FEU — ABNORMAL HIGH (ref 0.00–0.50)

## 2018-01-27 LAB — BASIC METABOLIC PANEL
ANION GAP: 16 — AB (ref 5–15)
BUN: 22 mg/dL (ref 8–23)
CALCIUM: 9.8 mg/dL (ref 8.9–10.3)
CHLORIDE: 100 mmol/L (ref 98–111)
CO2: 24 mmol/L (ref 22–32)
Creatinine, Ser: 5.34 mg/dL — ABNORMAL HIGH (ref 0.44–1.00)
GFR calc Af Amer: 8 mL/min — ABNORMAL LOW (ref >60)
GFR calc non Af Amer: 7 mL/min — ABNORMAL LOW (ref >60)
GLUCOSE: 86 mg/dL (ref 70–99)
Potassium: 4 mmol/L (ref 3.5–5.1)
Sodium: 140 mmol/L (ref 135–145)

## 2018-01-27 LAB — MRSA PCR SCREENING: MRSA by PCR: NEGATIVE

## 2018-01-27 LAB — I-STAT TROPONIN, ED: TROPONIN I, POC: 0 ng/mL (ref 0.00–0.08)

## 2018-01-27 LAB — URINE CULTURE

## 2018-01-27 LAB — BRAIN NATRIURETIC PEPTIDE: B Natriuretic Peptide: 2042.4 pg/mL — ABNORMAL HIGH (ref 0.0–100.0)

## 2018-01-27 MED ORDER — RENA-VITE PO TABS
1.0000 | ORAL_TABLET | Freq: Every day | ORAL | Status: DC
  Administered 2018-01-27 – 2018-01-31 (×4): 1 via ORAL
  Filled 2018-01-27 (×5): qty 1

## 2018-01-27 MED ORDER — ACETAMINOPHEN 650 MG RE SUPP: 650.0000 mg | Freq: Four times a day (QID) | RECTAL | Status: DC | PRN

## 2018-01-27 MED ORDER — PROMETHAZINE HCL 25 MG PO TABS
12.5000 mg | ORAL_TABLET | Freq: Four times a day (QID) | ORAL | Status: DC | PRN
  Administered 2018-01-27 – 2018-01-30 (×5): 12.5 mg via ORAL
  Filled 2018-01-27 (×3): qty 1

## 2018-01-27 MED ORDER — MORPHINE SULFATE (PF) 4 MG/ML IV SOLN: 2.0000 mg | Freq: Once | INTRAVENOUS | Status: DC

## 2018-01-27 MED ORDER — ENOXAPARIN SODIUM 40 MG/0.4ML ~~LOC~~ SOLN: 40.0000 mg | SUBCUTANEOUS | Status: DC

## 2018-01-27 MED ORDER — POLYETHYLENE GLYCOL 3350 17 G PO PACK: 17.0000 g | PACK | Freq: Every day | ORAL | Status: DC | PRN

## 2018-01-27 MED ORDER — AMLODIPINE BESYLATE 5 MG PO TABS
5.0000 mg | ORAL_TABLET | Freq: Every day | ORAL | Status: DC
  Administered 2018-01-28 – 2018-01-29 (×2): 5 mg via ORAL
  Filled 2018-01-27 (×2): qty 1

## 2018-01-27 MED ORDER — CHLORHEXIDINE GLUCONATE CLOTH 2 % EX PADS
6.0000 | MEDICATED_PAD | Freq: Every day | CUTANEOUS | Status: DC
  Administered 2018-01-28: 6 via TOPICAL

## 2018-01-27 MED ORDER — ASPIRIN EC 81 MG PO TBEC
81.0000 mg | DELAYED_RELEASE_TABLET | Freq: Every day | ORAL | Status: DC
  Administered 2018-01-29 – 2018-02-01 (×4): 81 mg via ORAL
  Filled 2018-01-27 (×5): qty 1

## 2018-01-27 MED ORDER — DM-GUAIFENESIN ER 30-600 MG PO TB12
1.0000 | ORAL_TABLET | Freq: Two times a day (BID) | ORAL | Status: DC | PRN
  Administered 2018-01-27: 1 via ORAL
  Filled 2018-01-27: qty 1

## 2018-01-27 MED ORDER — SODIUM CHLORIDE 0.9% FLUSH
3.0000 mL | Freq: Two times a day (BID) | INTRAVENOUS | Status: DC
  Administered 2018-01-27 – 2018-01-31 (×5): 3 mL via INTRAVENOUS

## 2018-01-27 MED ORDER — NITROGLYCERIN 0.4 MG SL SUBL: 0.4000 mg | SUBLINGUAL_TABLET | SUBLINGUAL | Status: DC | PRN

## 2018-01-27 MED ORDER — DICLOFENAC SODIUM 1 % TD GEL
4.0000 g | Freq: Four times a day (QID) | TRANSDERMAL | Status: DC
  Administered 2018-01-27 – 2018-02-01 (×12): 4 g via TOPICAL
  Filled 2018-01-27: qty 100

## 2018-01-27 MED ORDER — FUROSEMIDE 10 MG/ML IJ SOLN
20.0000 mg | Freq: Once | INTRAMUSCULAR | Status: AC
  Administered 2018-01-27: 20 mg via INTRAVENOUS
  Filled 2018-01-27: qty 2

## 2018-01-27 MED ORDER — ASPIRIN 81 MG PO CHEW
324.0000 mg | CHEWABLE_TABLET | Freq: Once | ORAL | Status: AC
  Administered 2018-01-27: 324 mg via ORAL
  Filled 2018-01-27: qty 4

## 2018-01-27 MED ORDER — PANTOPRAZOLE SODIUM 40 MG PO TBEC: 40.0000 mg | DELAYED_RELEASE_TABLET | Freq: Every day | ORAL | Status: DC

## 2018-01-27 MED ORDER — HEPARIN SODIUM (PORCINE) 5000 UNIT/ML IJ SOLN
5000.0000 [IU] | Freq: Three times a day (TID) | INTRAMUSCULAR | Status: DC
  Administered 2018-01-27 – 2018-01-31 (×10): 5000 [IU] via SUBCUTANEOUS
  Filled 2018-01-27 (×12): qty 1

## 2018-01-27 MED ORDER — CALCITRIOL 0.5 MCG PO CAPS
2.0000 ug | ORAL_CAPSULE | ORAL | Status: DC
Start: 2018-01-28 — End: 2018-02-01
  Administered 2018-01-30: 2 ug via ORAL

## 2018-01-27 MED ORDER — ACETAMINOPHEN 325 MG PO TABS
650.0000 mg | ORAL_TABLET | Freq: Four times a day (QID) | ORAL | Status: DC | PRN
  Administered 2018-01-31: 650 mg via ORAL
  Filled 2018-01-27: qty 2

## 2018-01-27 MED ORDER — SUCROFERRIC OXYHYDROXIDE 500 MG PO CHEW
500.0000 mg | CHEWABLE_TABLET | Freq: Three times a day (TID) | ORAL | Status: DC
  Administered 2018-01-28 – 2018-01-31 (×6): 500 mg via ORAL
  Filled 2018-01-27 (×16): qty 1

## 2018-01-27 NOTE — ED Notes (Signed)
Pt coughing up clear phlegm and reporting increased shortness of breath.  O2 sats remain at 96% but O2 applied via nasal cannula at 2 liters for comfort.

## 2018-01-27 NOTE — ED Notes (Signed)
Pt transported to [facility] via stretcher for V/Q scan.

## 2018-01-27 NOTE — ED Triage Notes (Signed)
Pt in from home via [facility], pt c/o mid non radiating CP worse with inspiration and cough, pt denies v/d, denies SOB, c/o nausea, pt receives HD, last tx yesterday with full tx, A&O x4, pt rates pain 5/10, hx of A fib, NSR in route, home health care RN called EMS after calling the pts PCP who cannot see her today and was told to come here d/t pt feeling bad since HD yesterday

## 2018-01-27 NOTE — ED Notes (Signed)
Rounding on pt, discussed admission to hospital.  Pt states that she is not coming into the hospital.  Dr. Reesa Chew notified.

## 2018-01-27 NOTE — ED Provider Notes (Addendum)
[facility] EMERGENCY DEPARTMENT Provider Note   CSN: [id number] Arrival date & time: 01/27/18  1143     History   Chief Complaint Chief Complaint  Patient presents with  . Chest Pain    HPI Nancy [name] is a 77 y.o. female past medical history of anemia, aortic stenosis, CHF, CKD (Monday, Wednesday, Friday dialysis patient), hypertension who presents for evaluation of chest pain that began approximately 5 AM this morning and persistent nausea/vomiting.  Patient reports that when she woke up this morning, she had chest pain and pressure which she describes as a "elephant sitting on my chest."  She states that she did not take any medication for the pain.  She states that initially when she had the pain she got diaphoretic.  She states that she has had nausea/vomiting persistently over the last several weeks.  Patient states that her pain was worse with deep inspiration.  She did not notice if it was worse with exertion.  Patient reports that she did have some shortness of breath earlier today.  She reports improvement.  She states that the shortness of breath is worse with exertion.  She states that she walked to her kitchen from her bedroom and states that she got very short of breath and had to sit down and rest.  She states that she will always have some minor shortness of breath but patient states that she feels like this is gotten worse in the last few weeks.  Patient also reports she has had some persistent coughing.  Cough is nonproductive.  Patient states that she has not had any trauma, injury, fall.  She was recently admitted on 01/25/18 for evaluation of persistent nausea/vomiting and UTI.  She was discharged yesterday.  She reports that on discharge, she felt better.  Patient denies any fever, abdominal pain, urinary complaints, leg swelling.  The history is provided by the patient.    Past Medical History:  Diagnosis Date  . Anemia   . Aortic stenosis   .  Bacterial sinusitis 09/17/2011  . CHF (congestive heart failure) ([place])   . CKD (chronic kidney disease) stage 4, GFR 15-29 ml/min ([place]) 08/11/2006   Cr continues to increase. Proteinuria on UA 02/10/12.    . Colitis   . CVA (cerebrovascular accident) [facility])    New hemorrhagic per CT scan '09  . Diverticulosis of colon   . Dysfunctional uterine bleeding   . ESRD (end stage renal disease) on dialysis ([place])    "MWF; [address] [address]" (11/27/2017)  . Fecal impaction ([place])   . Headache(784.0)   . Heart murmur   . HERNIORRHAPHY, HX OF 08/11/2006  . Hypertension   . OA (osteoarthritis)    bilateral knees  . Postmenopausal   . Pulmonary nodule   . TINEA CRURIS 01/12/2007    Patient Active Problem List   Diagnosis Date Noted  . Protein-calorie malnutrition, severe 01/12/2018  . Midline thoracic back pain 12/23/2017  . Secondary hyperparathyroidism of renal origin ([place]) 01/15/2017  . Mitral valve annular calcification   . Angina at rest [facility])   . Goals of care, counseling/discussion   . Aortic stenosis 05/21/2016  . Anemia associated with chronic renal failure 02/01/2016  . Atherosclerosis of aorta ([place]) 01/11/2015  . End stage renal disease ([place]) 02/04/2013  . Non-intractable vomiting with nausea 08/17/2012  . Glaucoma 03/18/2012  . Health care maintenance 09/17/2011  . Osteoarthrosis involving lower leg 10/31/2008  . History of CVA (cerebrovascular accident) 01/28/2008  .  Hyperlipidemia 02/13/2007  . PULMONARY NODULES 01/12/2007  . Left ventricular hypertrophy 09/02/2006  . Essential hypertension 08/11/2006  . GERD 08/11/2006    Past Surgical History:  Procedure Laterality Date  . ABDOMINAL HYSTERECTOMY    . AV FISTULA PLACEMENT Left 02/19/2017   Procedure: CREATION OF LEFT ARM BRACHIOCEPHALIC ARTERIOVENOUS (AV) FISTULA;  Surgeon: Rosetta Posner, MD;  Location: [facility];  Service: Vascular;  Laterality: Left;  . BASCILIC VEIN TRANSPOSITION Left 04/23/2017   Procedure: BASILIC VEIN  TRANSPOSITION SECOND STAGE;  Surgeon: Rosetta Posner, MD;  Location: [facility];  Service: Vascular;  Laterality: Left;  . CHOLECYSTECTOMY  2009  . COLONOSCOPY    . INGUINAL HERNIA REPAIR  2008  . INSERTION OF DIALYSIS CATHETER Right 02/19/2017   Procedure: INSERTION OF TUNNELED DIALYSIS CATHETER - RIGHT INTERNAL JUGULAR PLACEMENT;  Surgeon: Rosetta Posner, MD;  Location: [facility];  Service: Vascular;  Laterality: Right;  . IRIDOTOMY / IRIDECTOMY     Laser, right eye 12/26/11 left eye 01/24/12  . MASS EXCISION Left 05/07/2013   Procedure: EXCISION CYST;  Surgeon: Myrtha Mantis., MD;  Location: [facility];  Service: Ophthalmology;  Laterality: Left;     OB History   None      Home Medications    Prior to Admission medications   Medication Sig Start Date End Date Taking? Authorizing Provider  aspirin EC 81 MG EC tablet Take 1 tablet (81 mg total) by mouth daily. Patient not taking: Reported on 02/19/2018 12/10/16  Yes Valinda Party, DO  calcitRIOL (ROCALTROL) 0.5 MCG capsule Take 4 capsules (2 mcg total) by mouth every Monday, Wednesday, and Friday with hemodialysis. Patient not taking: Reported on 02/19/2018 12/30/17  Yes Neva Seat, MD  diclofenac sodium (VOLTAREN) 1 % GEL Apply 4 g topically 4 (four) times daily. Patient taking differently: Apply 4 g topically 4 (four) times daily as needed (pain).  01/20/18  Yes Mosetta Anis, MD  nitroGLYCERIN (NITROSTAT) 0.3 MG SL tablet Place 1 tablet (0.3 mg total) under the tongue every 5 (five) minutes as needed for chest pain. 02/27/17  Yes Sid Falcon, MD  sucroferric oxyhydroxide (VELPHORO) 500 MG chewable tablet Chew 500 mg by mouth 3 (three) times daily with meals.   Yes Rexene Agent, MD  amLODipine (NORVASC) 10 MG tablet Take 1 tablet (10 mg total) by mouth daily. 02/02/18   [name], MD  mirtazapine (REMERON) 15 MG tablet Take 1 tablet (15 mg total) by mouth at bedtime. 02/01/18   [name], MD    pantoprazole (PROTONIX) 40 MG tablet Take 1 tablet (40 mg total) by mouth daily. 01/14/18 02/13/18  [name], MD  promethazine (PHENERGAN) 12.5 MG tablet Take 1 tablet (12.5 mg total) by mouth every 6 (six) hours as needed for nausea or vomiting (Take 1 prior to dialysis). Take 1 tablet half an hour before meal. 02/01/18   [name], MD    Family History Family History  Problem Relation Age of Onset  . Hypertension Mother   . Congestive Heart Failure Mother   . Heart attack Brother 25    Social History Social History   Tobacco Use  . Smoking status: Never Smoker  . Smokeless tobacco: Never Used  Substance Use Topics  . Alcohol use: No    Alcohol/week: 0.0 standard drinks  . Drug use: No    Comment: 08/15/08 UDS + cocaine     Allergies   Hydrocodone   Review of Systems  Review of Systems  Constitutional: Negative for fever.  Respiratory: Positive for shortness of breath. Negative for cough.   Cardiovascular: Positive for chest pain. Negative for leg swelling.  Gastrointestinal: Negative for abdominal pain, nausea and vomiting.  Genitourinary: Negative for dysuria and hematuria.  Neurological: Negative for headaches.  All other systems reviewed and are negative.    Physical Exam Updated Vital Signs BP (!) 143/59 (BP Location: Right Arm)   Pulse 85   Temp 97.8 F (36.6 C) (Oral)   Resp 18   Ht 5\' 3"  (1.6 m)   Wt 54.7 kg   SpO2 100%   BMI 21.35 kg/m   Physical Exam  Constitutional: She is oriented to person, place, and time. She appears well-developed and well-nourished.  HENT:  Head: Normocephalic and atraumatic.  Mouth/Throat: Oropharynx is clear and moist and mucous membranes are normal.  Eyes: Pupils are equal, round, and reactive to light. Conjunctivae, EOM and lids are normal. Right eye exhibits no discharge. Left eye exhibits no discharge. No scleral icterus.  Neck: Full passive range of motion without pain.  Cardiovascular: Normal rate, regular  rhythm and normal pulses. Exam reveals no gallop and no friction rub.  Murmur heard.  Systolic murmur is present. Pulses:      Radial pulses are 2+ on the right side, and 2+ on the left side.  Systolic murmur noted. AV fistula noted to left upper extremity with positive thrill.  Pulmonary/Chest: Effort normal. She has rales.  Able to speak in full sentences without difficulty. Faint crackles noted.   Abdominal: Soft. Normal appearance. There is no tenderness. There is no rigidity and no guarding.  Abdomen is soft, non-distended, non-tender. No rigidity, No guarding. No peritoneal signs.  Musculoskeletal: Normal range of motion.  Neurological: She is alert and oriented to person, place, and time.  Skin: Skin is warm and dry. Capillary refill takes less than 2 seconds.  Psychiatric: She has a normal mood and affect. Her speech is normal and behavior is normal.  Nursing note and vitals reviewed.    ED Treatments / Results  Labs (all labs ordered are listed, but only abnormal results are displayed) Labs Reviewed  BASIC METABOLIC PANEL - Abnormal; Notable for the following components:      Result Value   Creatinine, Ser 5.34 (*)    GFR calc non Af Amer 7 (*)    GFR calc Af Amer 8 (*)    Anion gap 16 (*)    All other components within normal limits  CBC - Abnormal; Notable for the following components:   RBC 3.59 (*)    Hemoglobin 10.7 (*)    HCT 35.5 (*)    RDW 15.6 (*)    All other components within normal limits  D-DIMER, QUANTITATIVE (NOT AT [facility]) - Abnormal; Notable for the following components:   D-Dimer, Quant 1.58 (*)    All other components within normal limits  BRAIN NATRIURETIC PEPTIDE - Abnormal; Notable for the following components:   B Natriuretic Peptide 2,042.4 (*)    All other components within normal limits  BASIC METABOLIC PANEL - Abnormal; Notable for the following components:   CO2 18 (*)    Glucose, Bld 106 (*)    BUN 33 (*)    Creatinine, Ser 6.78 (*)      GFR calc non Af Amer 5 (*)    GFR calc Af Amer 6 (*)    Anion gap 21 (*)    All other components within normal limits  CBC - Abnormal; Notable for the following components:   RBC 3.44 (*)    Hemoglobin 10.4 (*)    HCT 32.9 (*)    RDW 15.6 (*)    All other components within normal limits  RENAL FUNCTION PANEL - Abnormal; Notable for the following components:   Potassium 3.2 (*)    Chloride 96 (*)    Glucose, Bld 104 (*)    Creatinine, Ser 5.66 (*)    Phosphorus 5.2 (*)    Albumin 3.3 (*)    GFR calc non Af Amer 7 (*)    GFR calc Af Amer 8 (*)    All other components within normal limits  CBC - Abnormal; Notable for the following components:   WBC 3.8 (*)    RBC 3.37 (*)    Hemoglobin 10.0 (*)    HCT 32.9 (*)    All other components within normal limits  MRSA PCR SCREENING  TROPONIN I  TROPONIN I  TROPONIN I  I-STAT TROPONIN, ED    EKG None  Radiology No results found.  Procedures Procedures (including critical care time)  Medications Ordered in ED Medications  promethazine (PHENERGAN) 25 MG tablet (has no administration in time range)  aspirin chewable tablet 324 mg (324 mg Oral Given 01/27/18 1349)  furosemide (LASIX) injection 20 mg (20 mg Intravenous Given 01/27/18 1527)  iopamidol (ISOVUE-370) 76 % injection 100 mL (100 mLs Intravenous Contrast Given 01/28/18 0153)  calcitRIOL (ROCALTROL) 0.5 MCG capsule (2 mcg  Given 01/28/18 1117)  technetium sulfur colloid ([place]) injection solution 2.2 millicurie (2.2 millicuries Oral Contrast Given 01/29/18 0857)  amLODipine (NORVASC) tablet 5 mg (5 mg Oral Given 01/29/18 1432)  promethazine (PHENERGAN) 25 MG tablet (  Duplicate 2/95/28 4132)  calcitRIOL (ROCALTROL) 0.5 MCG capsule (  Duplicate 4/40/10 2725)     Initial Impression / Assessment and Plan / ED Course  I have reviewed the triage vital signs and the nursing notes.  Pertinent labs & imaging results that were available during my care of the patient were  reviewed by me and considered in my medical decision making (see chart for details).     77 year old female with possible increase of anemia, aortic stenosis, CKD, hypertension who presents for evaluation of chest pain that began today at 5 AM.  Reports associated nausea/vomiting which is been persistent.  Also reports some dyspnea on exertion.  She states that she was has some degree of dyspnea on exertion but states today that she had difficulty walking from the kitchen to her room secondary to dyspnea and chest pain.  Has not taken any medication for the pain. Patient is afebrile, non-toxic appearing, sitting comfortably on examination table. Vital signs reviewed and stable.  Consider ACS etiology versus infectious etiology versus CHF exacerbation.  Low suspicion for PE but patient has been hospitalized recently and has been more immobile secondary to symptoms.  Plan to check basic labs, EKG, chest x-ray.  Troponin negative.  BMP shows creatinine of 5.34.  CBC shows hemoglobin 10.7, hematocrit 35.5.  BNP is elevated at 2, 042.4.  Her last BNP was in Jan 2019 and was 230.1.  This could be elevated secondary to end-stage renal disease but also could reflect worsening CHF.  Her chest x-ray does show questionable pulmonary vascular prominence.  Additionally, d-dimer is positive at 1.58.  Patient is still tachycardic.  Given dyspnea on exertion and tachycardia, will plan for further evaluation.  Given elevation in BNP and patient's symptoms, will likely need admission  for evaluation.  Discussed with patient.  She is agreeable to plan.  Discussed patient with Dr. Reesa Chew.  We will plan for admission.   Final Clinical Impressions(s) / ED Diagnoses   Final diagnoses:  Chest pain, unspecified type  Dyspnea on exertion  Orthopnea  Nausea and vomiting    ED Discharge Orders         Ordered    amLODipine (NORVASC) 10 MG tablet  Daily     02/01/18 1215    mirtazapine (REMERON) 15 MG tablet  Daily at  bedtime     02/01/18 1215    promethazine (PHENERGAN) 12.5 MG tablet  Every 6 hours PRN     02/01/18 1215    Increase activity slowly     02/01/18 1215    Diet - low sodium heart healthy     02/01/18 1215    Discharge instructions    Comments:  It was pleasure taking care of you. As we discussed you will call your dialysis center tomorrow morning at 5 AM, to go for their first shift dialysis.  This arrangement has been made so you can go for your gastroenterology appointment tomorrow at 3:15 PM. Please follow the directions of your gastroenterologist. We also added another medicine called Remeron 15 mg to be taken at bedtime to help with your nausea and decreased appetite. You can take Phenergan 12.5 mg half an hour before eating your meal to prevent any nausea or vomiting. Call your PCP office to be seen within next week.   02/01/18 1215    Call MD for:  persistant nausea and vomiting     02/01/18 1215           Volanda Napoleon, PA-C 01/28/18 1657    Julianne Rice, MD 01/29/18 1447    Volanda Napoleon, PA-C 02/20/18 1735    Julianne Rice, MD 02/24/18 1719

## 2018-01-27 NOTE — Progress Notes (Signed)
Admission note:  Arrival Method: Patient arrived on stretcher from ED. Mental Orientation:Alert and oriented x 4. Telemetry: 66M-05 NSR Assessment: See doc flow sheets. Skin: Warm, dry and intact. IV: Right FA and AC saline lock. Pain: Denies any pain. Tubes: N/A Safety Measures: Bed in low position, call bell and phone within reach. Fall Prevention Safety Plan: Reviewed the plan, verbalized understanding but refusing bed alarm Admission Screening:  In progress.  6700 Orientation: Patient has been oriented to the unit, staff and to the room.

## 2018-01-27 NOTE — Patient Outreach (Signed)
[facility]) Care Management  01/27/2018  Nancy [name] 07-21-1940 [id number]   Received a phone call from patient's CNA stating the patient was dizzy and stumbled a little with transfer. She also reported the patient did not feel well and had some bleeding from her port. Neither the CNA or the patient were good historians. They were advised to call the patient's PCP office (which they already have) first but to dial [phone] if the patient's symptoms persisted or she felt like she needed emergency care.    Plan: -add to telephone note at the  Provider's office -check in with patient in 24 hours  Elayne Guerin, PharmD, [facility] Clinical Pharmacist [phone]

## 2018-01-27 NOTE — Telephone Encounter (Signed)
rtc to pt home, EMS is present but state pt told them she didn't want to go with them, her [employer]CS worker is on ph, offered message that IM doctors will see pt if ED doctor feels pt needs to be admitted, she is agreeable and states she will come to ED via EMS eval.

## 2018-01-27 NOTE — H&P (Signed)
Date: 01/27/2018               Patient Name:  Nancy [name] MRN: [id number]  DOB: 1940/12/26 Age / Sex: 77 y.o., female   PCP: Aldine Contes, MD              Medical Service: Internal Medicine Teaching Service              Attending Physician: Dr. Aldine Contes, MD    First Contact: Tommye Standard, MS4 Pager: [phone]  Second Contact: Dr. [name]  Pager: [phone]  Third Contact Dr. Aldine Contes         After Hours (After 5p/  First Contact Pager: [phone]  weekends / holidays): Second Contact Pager: [phone]   Chief Complaint: nausea / vomiting  History of Present Illness: Nancy [name] is a 77 yo female with hx of ESRD on M/W/F HD, aortic stenosis with calcific vegetative mass, anemia, secondary hyperparathyroidism 2/2 renal disease, HFpEF, HTN, hx diverticulitis, GERD, and benign pulmonary nodules with multiple recent hospitalzations for intractable nausea and vomiting who presents today with nausea and vomiting which started after her dialysis session in the hospital yesterday. Pt tried to take her phenergan, but could not hold it down. She also endorses headache, without vision changes. Pt says this is consistent with how she typically feels after dialysis. She stopped her session 30 minutes early yesterday because of the nausea. Pt also endorses weakness and lightheadedness while standing.  Pt also endorses chronic lest sided chest pain that has been present for years that is worse with eating and worse upon exertion. Pt states it has been worse "since I turned 72." She attributes this pain to her hiatal hernia. Pt also has chronic cough that has recently gotten worse. Cough is non-productive, without hemoptysis. Pt denies SOB except when coughing. She endorses diaphoresis. Pt also endorses orthopnea which started today and PND overnight. Denies LE swelling.  Pt was just discharged yesterday, 01/26/18 after presenting with nausea and vomiting after dialysis which is pts  chief complaint for admission for the past 7 admissions in 3 months. After speaking with nephrology during last hospitalization, they do not believe nausea and vomiting can be attributed to dialysis dysequilibrium syndrome. We set up outpatient GI follow up, but that appointment is not until next week.   We attempted to start goals of care discussion with patient. Pt is adamant she never wants to go to a SNF. Pt appears to have poor understanding of her ESRD and prognosis. She was emphasizing that just last year she was "peeing like a race horse." After this brief discussion, pt is aware of her current poor quality of life, however does not appear ready to discuss change in treatment plan.   Meds:  Current Meds  Medication Sig  . acetaminophen (TYLENOL) 500 MG tablet Take 2 tablets (1,000 mg total) by mouth every 8 (eight) hours as needed for mild pain, moderate pain, fever or headache.  Marland Kitchen amLODipine (NORVASC) 5 MG tablet Take 1 tablet (5 mg total) by mouth daily.  Marland Kitchen aspirin EC 81 MG EC tablet Take 1 tablet (81 mg total) by mouth daily.  . calcitRIOL (ROCALTROL) 0.5 MCG capsule Take 4 capsules (2 mcg total) by mouth every Monday, Wednesday, and Friday with hemodialysis.  Marland Kitchen dextromethorphan-guaiFENesin (MUCINEX DM) 30-600 MG 12hr tablet Take 1 tablet by mouth 2 (two) times daily as needed for up to 30 doses for cough.  . diclofenac sodium (VOLTAREN) 1 % GEL Apply 4  g topically 4 (four) times daily. (Patient taking differently: Apply 4 g topically 4 (four) times daily as needed (pain). )  . multivitamin (RENA-VIT) TABS tablet Take 1 tablet by mouth at bedtime.  . nitroGLYCERIN (NITROSTAT) 0.3 MG SL tablet Place 1 tablet (0.3 mg total) under the tongue every 5 (five) minutes as needed for chest pain.  . promethazine (PHENERGAN) 12.5 MG tablet Take 1 tablet (12.5 mg total) by mouth every 6 (six) hours as needed for nausea or vomiting (Take 1 prior to dialysis).  . sucroferric oxyhydroxide (VELPHORO) 500  MG chewable tablet Chew 500 mg by mouth 3 (three) times daily with meals.     Allergies: Allergies as of 01/27/2018 - Review Complete 01/27/2018  Allergen Reaction Noted  . Hydrocodone Nausea And Vomiting and Other (See Comments) 12/26/2017   Past Medical History:  Diagnosis Date  . Anemia   . Aortic stenosis   . Bacterial sinusitis 09/17/2011  . CHF (congestive heart failure) ([place])   . CKD (chronic kidney disease) stage 4, GFR 15-29 ml/min ([place]) 08/11/2006   Cr continues to increase. Proteinuria on UA 02/10/12.    . Colitis   . CVA (cerebrovascular accident) [facility])    New hemorrhagic per CT scan '09  . Diverticulosis of colon   . Dysfunctional uterine bleeding   . ESRD (end stage renal disease) on dialysis ([place])    "MWF; [facility]" (11/27/2017)  . Fecal impaction ([place])   . Headache(784.0)   . Heart murmur   . HERNIORRHAPHY, HX OF 08/11/2006  . Hypertension   . OA (osteoarthritis)    bilateral knees  . Postmenopausal   . Pulmonary nodule   . TINEA CRURIS 01/12/2007    Family History:  HTN - mother CHF - mother MI - Brother 66  Social History: Lives alone with home healthcare. Pt has daughter in [place] and Granddaughter in [place]. Denies hx of tobacco, alcohol, drug use. UDS+ cocaine 2010.  Review of Systems: A complete ROS was negative except as per HPI.   Physical Exam: Blood pressure (!) 168/140, pulse (!) 111, temperature 97.8 F (36.6 C), temperature source Oral, resp. rate 20, SpO2 97 %. Gen: coughing throughout interview, could not lie flat  HEENT: EOMI, moist mucus membranes Cardio: RRR, S1, S2,  3/6 crescendo - decrescendo systolic murmur  Resp: LCAB, no increased WOB Abdominal: soft, no rebound, no guarding, non-tender, +BS Extremities: warm, no pitting edema Derm: L arm AV graft with scab of pervious bleeding, no active bleeding, without erythema or purulence Neuro: A&O x 3, normal speech  Labs: D-dimer - 1.58 in the setting of ESRD Trop <0.03 BNP  2042 in setting of ESRD  EKG: pending  CXR: personally reviewed my interpretation is grossly unchanged from prior CXR 01/11/18. Pt has chronic interstitial pulmonary edema present on CXRs. Thoracic atherosclerosis  Assessment & Plan by Problem: Ms. Bruinsma is a 77 yo female with hx of ESRD on M/W/F HD, aortic stenosis with calcific vegetative mass, anemia, secondary hyperparathyroidism 2/2 renal disease, HFpEF, HTN, hx diverticulitis, GERD, and benign pulmonary nodules with multiple recent hospitalzations for intractable nausea and vomiting who presents today with nausea and vomiting which started after her dialysis session in the hospital yesterday consistent with previous admissions. She also endorses worsening cough, orthopnea, PND with elevated D dimer and BNP in the setting of ESRD.   #Intractable nausea and vomiting following dialysis -session cut short by 30 min on Monday 01/26/18 d/t n/v -chronic, Phenergan does not help anymore Plan: -palliative  consultation for symptom management and goals of care discussion -Phenergan PRN n/v  #Atypical Chest Pain -left sided chest pain worse with food and exertion that has been present for years -recent increase in dry cough, diaphoresis, orthopnea, and PND concerning for more acute process -pt objectively with tachycardia, tachypnea, and HTN - normal O2 sats on room air -D Dimer and BNP elevated, however unreliable in setting of ESRD -Wells Score for PE 1.5 - low risk (1.5 points for tachycardia) -this is likely sxs of GERD complicated by pts overall failure to thrive associated with intractable n/v from dialysis however we will continue to rule out life threatening causes including PE, severe AS, or HF exacerbation Plan: -CTA to rule out PE -TTE for evidence of worsening HF or AS -EKG pending -continue home Protonix -Mucinex DM PRN cough -Nitroglycerin tab PRN chest pain  #ESRD on MWF HD -nephrology consulted - will see if they want pt to go  tonight for HD d/t volume overload -continue regularly scheduled HD -Phenergen ppx prior to HD -continue renal vitamins  #HTN -continue home amlodipine 5mg  po qd  #DVT Ppx: Heparin  #Dispo: Inpatient admission with LOS 24-48 hrs.  Signed: Christin Bach, Medical Student 01/27/2018, 5:10 PM  Pager: [phone]  Attestation for Student Documentation:  I personally was present and performed or re-performed the history, physical exam and medical decision-making activities of this service and have verified that the service and findings are accurately documented in the student's note.  [name], MD 01/28/2018, 10:46 AM

## 2018-01-27 NOTE — Telephone Encounter (Signed)
Received a phone call from patient's CNA stating the patient was dizzy and stumbled a little with transfer. She also reported the patient did not feel well and had some bleeding from her port. Neither the CNA or the patient were good historians. They were advised to call your office (which they already have) first but to dial [phone] if the patient's symptoms persisted or she felt like she needed emergency care.   Elayne Guerin, PharmD, [place] Clinical Pharmacist [phone]

## 2018-01-27 NOTE — Telephone Encounter (Signed)
Pt was just discharge from hospital on 07/22 and she is very weak and can't keep food down, pt contact# [phone]

## 2018-01-27 NOTE — Patient Outreach (Signed)
[facility]) Care Management  01/27/2018  [place] B [place] 1940-07-31 [id number]   Recent hospitalization 6/20-6/25 with intractable nausea vomiting.7/7-7/9 viral gastroenteritis, ESRD. 77 year old with history of ESRD/HD, heart failure, stroke.  Client noted to be in the emergency room with report of chest pain.  Plan: Continue to follow.  Thea Silversmith, RN, MSN, [place] Coordinator Cell: [phone]

## 2018-01-28 ENCOUNTER — Other Ambulatory Visit: Payer: Self-pay | Admitting: Pharmacist

## 2018-01-28 ENCOUNTER — Observation Stay (HOSPITAL_COMMUNITY): Payer: Medicare Other

## 2018-01-28 ENCOUNTER — Ambulatory Visit: Payer: Self-pay | Admitting: Pharmacist

## 2018-01-28 DIAGNOSIS — I132 Hypertensive heart and chronic kidney disease with heart failure and with stage 5 chronic kidney disease, or end stage renal disease: Secondary | ICD-10-CM | POA: Diagnosis not present

## 2018-01-28 DIAGNOSIS — D649 Anemia, unspecified: Secondary | ICD-10-CM

## 2018-01-28 DIAGNOSIS — R0789 Other chest pain: Secondary | ICD-10-CM

## 2018-01-28 DIAGNOSIS — N186 End stage renal disease: Secondary | ICD-10-CM

## 2018-01-28 DIAGNOSIS — Z992 Dependence on renal dialysis: Secondary | ICD-10-CM

## 2018-01-28 DIAGNOSIS — R011 Cardiac murmur, unspecified: Secondary | ICD-10-CM

## 2018-01-28 DIAGNOSIS — R112 Nausea with vomiting, unspecified: Secondary | ICD-10-CM | POA: Diagnosis not present

## 2018-01-28 DIAGNOSIS — K219 Gastro-esophageal reflux disease without esophagitis: Secondary | ICD-10-CM

## 2018-01-28 DIAGNOSIS — R0602 Shortness of breath: Secondary | ICD-10-CM | POA: Diagnosis not present

## 2018-01-28 DIAGNOSIS — R918 Other nonspecific abnormal finding of lung field: Secondary | ICD-10-CM | POA: Diagnosis not present

## 2018-01-28 DIAGNOSIS — I350 Nonrheumatic aortic (valve) stenosis: Secondary | ICD-10-CM

## 2018-01-28 DIAGNOSIS — I5032 Chronic diastolic (congestive) heart failure: Secondary | ICD-10-CM | POA: Diagnosis not present

## 2018-01-28 DIAGNOSIS — I5030 Unspecified diastolic (congestive) heart failure: Secondary | ICD-10-CM

## 2018-01-28 DIAGNOSIS — Z79899 Other long term (current) drug therapy: Secondary | ICD-10-CM

## 2018-01-28 DIAGNOSIS — Z8719 Personal history of other diseases of the digestive system: Secondary | ICD-10-CM

## 2018-01-28 DIAGNOSIS — Z6821 Body mass index (BMI) 21.0-21.9, adult: Secondary | ICD-10-CM | POA: Diagnosis not present

## 2018-01-28 DIAGNOSIS — N2581 Secondary hyperparathyroidism of renal origin: Secondary | ICD-10-CM

## 2018-01-28 DIAGNOSIS — R630 Anorexia: Secondary | ICD-10-CM | POA: Diagnosis not present

## 2018-01-28 LAB — CBC
HEMATOCRIT: 32.9 % — AB (ref 36.0–46.0)
HEMOGLOBIN: 10.4 g/dL — AB (ref 12.0–15.0)
MCH: 30.2 pg (ref 26.0–34.0)
MCHC: 31.6 g/dL (ref 30.0–36.0)
MCV: 95.6 fL (ref 78.0–100.0)
Platelets: 189 10*3/uL (ref 150–400)
RBC: 3.44 MIL/uL — AB (ref 3.87–5.11)
RDW: 15.6 % — ABNORMAL HIGH (ref 11.5–15.5)
WBC: 5.7 10*3/uL (ref 4.0–10.5)

## 2018-01-28 LAB — BASIC METABOLIC PANEL
Anion gap: 21 — ABNORMAL HIGH (ref 5–15)
BUN: 33 mg/dL — AB (ref 8–23)
CALCIUM: 9.9 mg/dL (ref 8.9–10.3)
CHLORIDE: 102 mmol/L (ref 98–111)
CO2: 18 mmol/L — ABNORMAL LOW (ref 22–32)
CREATININE: 6.78 mg/dL — AB (ref 0.44–1.00)
GFR, EST AFRICAN AMERICAN: 6 mL/min — AB (ref >60)
GFR, EST NON AFRICAN AMERICAN: 5 mL/min — AB (ref >60)
Glucose, Bld: 106 mg/dL — ABNORMAL HIGH (ref 70–99)
Potassium: 4.9 mmol/L (ref 3.5–5.1)
SODIUM: 141 mmol/L (ref 135–145)

## 2018-01-28 LAB — TROPONIN I
Troponin I: <0.03 ng/mL (ref <0.03)
Troponin I: <0.03 ng/mL (ref <0.03)

## 2018-01-28 MED ORDER — PROMETHAZINE HCL 25 MG PO TABS
ORAL_TABLET | ORAL | Status: AC
  Filled 2018-01-28: qty 1

## 2018-01-28 MED ORDER — IOPAMIDOL (ISOVUE-370) INJECTION 76%
100.0000 mL | Freq: Once | INTRAVENOUS | Status: AC | PRN
  Administered 2018-01-28: 100 mL via INTRAVENOUS

## 2018-01-28 MED ORDER — CALCITRIOL 0.5 MCG PO CAPS
ORAL_CAPSULE | ORAL | Status: AC
  Administered 2018-01-28: 2 ug
  Filled 2018-01-28: qty 4

## 2018-01-28 NOTE — Progress Notes (Signed)
Patient is very hard stick and unable to get another 20 guaze IV for CT angio, Dr. Tommye Standard made aware of the situation, she said we will hold the CT for right now and decide what to do when we check the patient.

## 2018-01-28 NOTE — Progress Notes (Signed)
Thank you for the information.

## 2018-01-28 NOTE — Telephone Encounter (Signed)
Pt has been readmitted 

## 2018-01-28 NOTE — Progress Notes (Signed)
Date: 01/28/2018  Patient name: Nancy [name]  Medical record number: [id number]  Date of birth: [birth year]   I have seen and evaluated Nancy B Southers and discussed their care with the Residency Team.  In brief, patient is a 77 year old female with a past medical history of ESRD on hemodialysis, aortic stenosis, anemia, heart failure with preserved ejection fraction, GERD and benign pulmonary nodules with multiple recent hospitalizations for intractable nausea and vomiting who presented with recurrent nausea and vomiting after being discharged from the hospital on Monday.  Patient states that after hemodialysis session on Monday she developed recurrent nausea and is unable to take her Phenergan.  Patient was complained of multiple episodes of vomiting which were nonbilious and nonbloody.  Patient was unable to keep anything down.  She also states that she has been stopping her dialysis sessions early secondary to nausea.  Patient did complain of worsening shortness of breath as well as atypical left-sided chest pain which worsened with eating as well as exertion and cough.  Patient also developed a new cough yesterday which was initially nonproductive but now with some whitish phlegm.  Patient also had some associated orthopnea.  No lower extremity swelling, no palpitations, no diaphoresis, no lightheadedness, no syncope, no focal weakness, no headache, no blurry vision, no fevers or chills.  Today patient states that she has persistent shortness of breath and cough but is able to speak in full sentences.  She also complains of persistent nausea but no abdominal pain.  PMHx, Fam Hx, and/or Soc Hx : As per resident admit note  Vitals:   01/28/18 1030 01/28/18 1100  BP: (!) 175/83 (!) 169/82  Pulse: (!) 102 (!) 104  Resp:    Temp:    SpO2:     General: Awake, alert, oriented x3, NAD CVS: Regular rate and rhythm, systolic murmur noted  Lungs: Bibasilar crackles noted Abdomen: Soft, nontender,  nondistended, normoactive bowel sounds Extremities: No edema noted  Assessment and Plan: I have seen and evaluated the patient as outlined above. I agree with the formulated Assessment and Plan as detailed in the residents' note, with the following changes:   1.  Recurrent nausea and vomiting: -The etiology behind this patient's nausea and vomiting remains uncertain at this time. Patient has had multiple admissions in the last couple of months for similar symptoms.  Her symptoms were initially relieved with Phenergan but even this is not been helping her recently.  She has been cutting her hemodialysis sessions short secondary to developing nausea. -Patient has had a mesenteric Doppler in the recent past which did not show evidence of mesenteric ischemia -Case was discussed with GI who advised an abdominal ultrasound as well as possibly getting a gastric emptying study as well.  They will follow-up with her as an outpatient on Monday. -Continue with Phenergan as needed for now.  Will obtain palliative care consult for goals of care discussion as well as symptom management. -Case discussed with nephrology at bedside.  We will continue with hemodialysis per nephrology.  It is possible that patient may be getting mildly uremic secondary to not finishing her hemodialysis sessions which could be contributing to her nausea as well as her decreased oral intake. -No further work-up at this time  2.  Chest pain: -Today patient complained of left-sided chest pain which worsened with cough and was reproducible on palpation.  I suspect the pain is likely musculoskeletal in nature.  However, patient also had some tachycardia and tachypnea as well as  increased shortness of breath on presentation with an elevated d-dimer as well as a chest x-ray which is concerning for possible right lower lobe infiltrate.  Patient also noted to have mild CHF on her chest x-ray and has been unable to complete her full hemodialysis  session secondary to nausea and may be fluid overloaded as well which may be contributing to her symptoms. -We will follow-up EKG today.  Check troponins x1 -We will also recheck a 2D echo to evaluate for worsening EF given that her symptoms may be caused by fluid overload. -We were unable to obtain a CTA as patient with poor IV access.  Patient is at low risk for PE (Wells score is 1.5).  She also has end-stage renal disease which could elevate her d-dimer as well.  PE is lower on my differential at this time.  We will hold off on a CTA for now -Patient was noted to have a right lower lobe infiltrate on her chest x-ray in addition to having cough as well as pleuritic chest pain concerning for possible pneumonia.  It is possible that patient may have had an aspiration event secondary to her recurrent nausea and vomiting.  She however has a normal white count and no fevers or chills.  We will hold off on antibiotics for now. -We will repeat chest x-ray today (PA and lateral) after hemodialysis to see if this resolves and if her symptoms are secondary to fluid overload from her shortened hemodialysis sessions.  She does have an elevated BNP which would suggest this as well. -If her right lower lobe infiltrate is persistent would consider starting antibiotics to cover for aspiration pneumonia and obtaining a CT chest for further evaluation if she has no improvement.   Aldine Contes, MD 7/24/201911:15 AM

## 2018-01-28 NOTE — Progress Notes (Signed)
Subjective: Pt seen in dialysis this AM. She states she was still nausea and vomiting overnight before going to dialysis. Unable to tolerate food. She continues to endorse chest pain, worse with movement, worse with cough, and worse when pressing on it. Diaphoresis has improved. Pt is able to lie flat during dialysis. Pt agrees to take Phenergan during dialysis and we encouraged her to try to finish the full session.  Objective:  Vital signs in last 24 hours: Vitals:   01/28/18 0835 01/28/18 0900 01/28/18 0930 01/28/18 1000  BP: (!) 177/86 (!) 184/90 (!) 175/89 (!) 174/88  Pulse: (!) 106 (!) 105 (!) 109 (!) 102  Resp:      Temp:      TempSrc:      SpO2:      Weight:      Height:       QMV:HQIONGEX, NAD  Cardio: RRR, S1, S2,3/6 crescendo - decrescendo systolic murmur, L chest wall tender to palpation Resp: mild bibasilar crackles, coughing throughout interview, increased WOB with sitting Abdominal: soft,no rebound, no guarding, non-tender, +BS Extremities:warm, no pitting edema Neuro: A&O x 3, normal speech  Assessment/Plan: [name] is a 77 yo female with hx of ESRD on M/W/F HD, aortic stenosis with calcific vegetative mass, anemia, secondary hyperparathyroidism 2/2 renal disease, HFpEF, HTN, hx diverticulitis, GERD, and benign pulmonary nodules with multiple recent hospitalzations for intractable nausea and vomiting who presents today withnausea and vomiting which started after her dialysis session in the hospital yesterday consistent with previous admissions. She also endorses worsening cough, orthopnea, PND with elevated D dimer and BNP in the setting of ESRD.   #Intractable nausea and vomiting following dialysis -session cut short by 30 min on Monday 01/26/18 d/t n/v -chronic, Phenergan helps minimally -nephrology does not believe this is dialysis dysequilibrium syndrome, maybe a component of chronic uremia -pt reports hx hiatal hernia, 12 week PPi trial started only 2.5  weeks ago Plan: -GI consult - [facility] - recommend RUQ [place], HIDA, and gastric emptying study and Dr. Benson Norway will see pt Monday - we will proceed with RUQ [place] first. -palliative consultation for symptom management and goals of care discussion -Phenergan PRN n/v  #Atypical Chest Pain -left sided chest pain worse with movement, food and exertion that has been present for years -recent increase in dry cough, diaphoresis, orthopnea, and PND concerning for more acute process -pt objectively with tachycardia, tachypnea, and HTN - 92% O2 sats on room air -D Dimer and BNP elevated, however unreliable in setting of ESRD -Wells Score for PE 1.5 - low risk (1.5 points for tachycardia) -this is likely sxs of volume overload and MSK strain from coughing complicated by pts overall failure to thrive associated with intractable n/v from dialysis however we will continue to rule out life threatening causes including PE, PNA severe AS, or HF exacerbation -unable to obtain IV access for CTA, will reassess after dialysis -reported hx of hiatal hernia with GERD sxs,  Plan: -CXR - PA / lateral following dialysis to reassess possible underlying PNA -TTE for evidence of worsening HF or AS -EKG pending -trend troponin   -continue home Protonix -Mucinex DM PRN cough -Nitroglycerin tab PRN chest pain  #ESRD on MWF HD -nephrology consulted - pt having HD today 7/24 -continue regularly scheduled HD -Phenergen ppx prior to HD -continue renal vitamins  #HTN -continue home amlodipine 5mg  po qd  #DVT Ppx: Heparin  #Dispo: Inpatient admission with LOS 24-48 hrs.  Sharissa Brierley, Earnest Conroy, Careers information officer  01/28/2018, 10:40 AM Pager [phone]

## 2018-01-28 NOTE — Patient Outreach (Signed)
[facility]) Care Management  01/28/2018  [place] B [place] 04-03-1941 [id number]   Patient's chart was reviewed prior to reaching out to her to follow up after instructing her caregiver to dial 911 yesterday.  Plan: Follow up with patient after hospitalization.    Elayne Guerin, PharmD, [place] Clinical Pharmacist [phone]

## 2018-01-28 NOTE — Progress Notes (Signed)
Internal Medicine on call notified of patients inability to get CT scan due to 20G IV not working properly. 20G IV was placed tonight by IV team specifically for CT. In addition, I made patients high blood pressure known. See Vitals.

## 2018-01-28 NOTE — Progress Notes (Addendum)
[facility] Progress Note   Subjective:   Discharged from the hospital 2 days ago after being admitted for nausea/vomiting with unclear etiology.  States the following day she began having SOB and CP so came back to the hospital.  Continues to have n/v.   Objective Vitals:   01/28/18 1100 01/28/18 1130 01/28/18 1200 01/28/18 1231  BP: (!) 169/82 (!) 172/83 (!) 174/81 (!) 163/77  Pulse: (!) 104 100 100 100  Resp:    20  Temp:    (!) 97.5 F (36.4 C)  TempSrc:    Oral  SpO2:    96%  Weight:    56.9 kg (125 lb 7.1 oz)  Height:       Physical Exam General:NAD, chronically ill appearing female Heart:RRR Lungs:CTAB Abdomen:soft, ND, +tenderness Extremities:no LE edema Dialysis Access: LU AVF cannulated  Filed Weights   01/27/18 2108 01/28/18 0825 01/28/18 1231  Weight: 60.8 kg (134 lb 0.6 oz) 59.8 kg (131 lb 13.4 oz) 56.9 kg (125 lb 7.1 oz)    Intake/Output Summary (Last 24 hours) at 01/28/2018 1404 Last data filed at 01/28/2018 1231 Gross per 24 hour  Intake 0 ml  Output 2000 ml  Net -2000 ml    Additional Objective Labs: Basic Metabolic Panel: Recent Labs  Lab 01/26/18 0221 01/27/18 1148 01/28/18 0638  NA 141 140 141  K 4.4 4.0 4.9  CL 102 100 102  CO2 23 24 18*  GLUCOSE 88 86 106*  BUN 42* 22 33*  CREATININE 8.44* 5.34* 6.78*  CALCIUM 9.3 9.8 9.9  PHOS 6.5*  --   --    Liver Function Tests: Recent Labs  Lab 01/25/18 2030 01/26/18 0221  AST 13*  --   ALT 8  --   ALKPHOS 43  --   BILITOT 0.6  --   PROT 6.5  --   ALBUMIN 3.0* 2.9*   Recent Labs  Lab 01/25/18 2030  LIPASE 44   CBC: Recent Labs  Lab 01/25/18 2030 01/26/18 0221 01/27/18 1148 01/28/18 0638  WBC 5.1 5.5 4.6 5.7  HGB 9.8* 9.7* 10.7* 10.4*  HCT 31.9* 31.9* 35.5* 32.9*  MCV 99.4 99.7 98.9 95.6  PLT 181 183 183 189   Blood Culture    Component Value Date/Time   SDES URINE, RANDOM 01/25/2018 2210   SPECREQUEST STERILE CONTAINER 01/25/2018 2210   CULT MULTIPLE  SPECIES PRESENT, SUGGEST RECOLLECTION (A) 01/25/2018 2210   REPTSTATUS 01/27/2018 FINAL 01/25/2018 2210    Cardiac Enzymes: Recent Labs  Lab 01/26/18 0221  TROPONINI <0.03    Lab Results  Component Value Date   INR 1.30 02/17/2017   INR 1.14 09/21/2016   INR 1.06 07/13/2012   Studies/Results: Dg Chest 2 View  Result Date: 01/27/2018 CLINICAL DATA:  Five days of nausea and vomiting without diarrhea. The vomiting is occasionally post-tussive. The patient also reports shortness of breath and mid chest pain. History of CHF. EXAM: CHEST - 2 VIEW COMPARISON:  Chest x-ray of January 11, 2018 FINDINGS: The lungs are well-expanded. The interstitial markings are mildly increased greatest at the right lung base. There is blunting of the posterior costophrenic angles. The heart is mildly enlarged but stable. The pulmonary vascularity is mildly prominent centrally but also stable. There is calcification in the wall of the aortic arch. IMPRESSION: Increased lung markings predominantly in the right infrahilar region are worrisome for pneumonia in the right lower lobe anteriorly. Probable small right pleural effusion. Followup PA and lateral chest X-ray is recommended  in 3-4 weeks following trial of antibiotic therapy to ensure resolution and exclude underlying malignancy. Mild cardiomegaly and central pulmonary vascular prominence may reflect low-grade compensated CHF. Thoracic aortic atherosclerosis. Electronically Signed   By: David  Martinique M.D.   On: 01/27/2018 12:32    Medications:  . amLODipine  5 mg Oral Daily  . aspirin EC  81 mg Oral Daily  . calcitRIOL  2 mcg Oral Q M,W,F-HD  . Chlorhexidine Gluconate Cloth  6 each Topical Q0600  . diclofenac sodium  4 g Topical QID  . heparin injection (subcutaneous)  5,000 Units Subcutaneous Q8H  .  morphine injection  2 mg Intravenous Once  . multivitamin  1 tablet Oral QHS  . promethazine      . sodium chloride flush  3 mL Intravenous Q12H  . sucroferric  oxyhydroxide  500 mg Oral TID WC    Dialysis Orders: MWF - [facility]  4hrs, BFR 300, DFR 800,  EDW 58kg,  2K/ 2.5Ca  Access: LU AVF  Heparin 3000 Unit bolus Mircera 150 mcg q2wks - last 01/21/18 Calcitriol 2 mcg PO qHD Velphoro 500mg  tablet - 1 TID   Assessment/Plan: 1. Chest pain/SOB - CXR worrisome for PNA vs. Pulmonary edema.  No ABX for now. To repeat CXR post HD.  ECHO ordered.  2. N/V - GI consulted. Recommend ABD [place] +/- gastric emptying study.  To follow up with her as OP in Monday. 3. ESRD - HD MWF.  Orders written for today per regular schedule with a total net UF of 2L removed. Under edw post HD.  Will likely need to be reassess at d/c.  4. Anemia of CKD- Hgb 10.4, no indication for ESA at this time.  5. Secondary hyperparathyroidism - Ca in goal. Phos ^. Continue binders and VDRA. 6. HTN/volume - BP elevated with slight improvement post HD.  Post HD CXR ordered. May need further lower of her dry due to weight loss from lack of appetite due to n/v.  7. Nutrition - Alb 2.9. Renal diet with fluid restrictions. Prostat 8. GERD  Jen Mow, PA-C [facility] Pager: [phone] 01/28/2018,2:04 PM  LOS: 0 days   Pt seen, examined and agree w A/P as above.  Kelly Splinter MD [employer] pager [phone]   01/28/2018, 4:26 PM

## 2018-01-29 ENCOUNTER — Observation Stay (HOSPITAL_BASED_OUTPATIENT_CLINIC_OR_DEPARTMENT_OTHER): Payer: Medicare Other

## 2018-01-29 ENCOUNTER — Observation Stay (HOSPITAL_COMMUNITY): Payer: Medicare Other

## 2018-01-29 DIAGNOSIS — R112 Nausea with vomiting, unspecified: Secondary | ICD-10-CM | POA: Diagnosis not present

## 2018-01-29 DIAGNOSIS — I132 Hypertensive heart and chronic kidney disease with heart failure and with stage 5 chronic kidney disease, or end stage renal disease: Secondary | ICD-10-CM | POA: Diagnosis not present

## 2018-01-29 DIAGNOSIS — I361 Nonrheumatic tricuspid (valve) insufficiency: Secondary | ICD-10-CM

## 2018-01-29 DIAGNOSIS — R918 Other nonspecific abnormal finding of lung field: Secondary | ICD-10-CM | POA: Diagnosis not present

## 2018-01-29 DIAGNOSIS — R1013 Epigastric pain: Secondary | ICD-10-CM | POA: Diagnosis not present

## 2018-01-29 DIAGNOSIS — N186 End stage renal disease: Secondary | ICD-10-CM | POA: Diagnosis not present

## 2018-01-29 DIAGNOSIS — Z9049 Acquired absence of other specified parts of digestive tract: Secondary | ICD-10-CM

## 2018-01-29 DIAGNOSIS — R0789 Other chest pain: Secondary | ICD-10-CM | POA: Diagnosis not present

## 2018-01-29 MED ORDER — ALTEPLASE 2 MG IJ SOLR: 2.0000 mg | Freq: Once | INTRAMUSCULAR | Status: DC | PRN

## 2018-01-29 MED ORDER — LIDOCAINE HCL (PF) 1 % IJ SOLN: 5.0000 mL | INTRAMUSCULAR | Status: DC | PRN

## 2018-01-29 MED ORDER — SODIUM CHLORIDE 0.9 % IV SOLN: 100.0000 mL | INTRAVENOUS | Status: DC | PRN

## 2018-01-29 MED ORDER — PENTAFLUOROPROP-TETRAFLUOROETH EX AERO: 1.0000 "application " | INHALATION_SPRAY | CUTANEOUS | Status: DC | PRN

## 2018-01-29 MED ORDER — LIDOCAINE-PRILOCAINE 2.5-2.5 % EX CREA: 1.0000 "application " | TOPICAL_CREAM | CUTANEOUS | Status: DC | PRN

## 2018-01-29 MED ORDER — TECHNETIUM TC 99M SULFUR COLLOID
2.2000 | Freq: Once | INTRAVENOUS | Status: AC | PRN
  Administered 2018-01-29: 2.2 via ORAL

## 2018-01-29 MED ORDER — HEPARIN SODIUM (PORCINE) 1000 UNIT/ML DIALYSIS: 1000.0000 [IU] | INTRAMUSCULAR | Status: DC | PRN

## 2018-01-29 MED ORDER — AMLODIPINE BESYLATE 10 MG PO TABS
10.0000 mg | ORAL_TABLET | Freq: Every day | ORAL | Status: DC
  Administered 2018-01-31 – 2018-02-01 (×2): 10 mg via ORAL
  Filled 2018-01-29 (×4): qty 1

## 2018-01-29 MED ORDER — AMLODIPINE BESYLATE 5 MG PO TABS
5.0000 mg | ORAL_TABLET | Freq: Once | ORAL | Status: AC
  Administered 2018-01-29: 5 mg via ORAL

## 2018-01-29 MED ORDER — CHLORHEXIDINE GLUCONATE CLOTH 2 % EX PADS: 6.0000 | MEDICATED_PAD | Freq: Every day | CUTANEOUS | Status: DC

## 2018-01-29 NOTE — Progress Notes (Signed)
   Subjective: Pt seen after gastric emptying study today. She is nauseated from the study and has vomited. Otherwise no abdominal pain. No chest pain or SOB. Pt endorses continued cough.  Objective:  Vital signs in last 24 hours: Vitals:   01/28/18 1700 01/28/18 2112 01/29/18 0454 01/29/18 0821  BP: (!) 153/73 (!) 150/63 (!) 173/72 (!) 173/71  Pulse: 89 96 (!) 102 (!) 102  Resp: 18 19 17 18   Temp: 97.6 F (36.4 C) 98.4 F (36.9 C) 98.7 F (37.1 C) 97.9 F (36.6 C)  TempSrc: Oral   Oral  SpO2: 98% 92% 95% 99%  Weight:  56.9 kg (125 lb 7.1 oz)    Height:       DHR:[id number], NAD Cardio: RRR, S1, S2,3/6 crescendo - decrescendo systolic murmur Resp: LCAB, no increased WOB  Abdominal: soft,no rebound, no guarding,non-tender, +BS Extremities:warm,no pitting edema Neuro: A&O x 3, normal speech   Assessment/Plan: Ms. Olveda is a 77 yo female with hx of ESRD on M/W/F HD, aortic stenosis with calcific vegetative mass, anemia, secondary hyperparathyroidism 2/2 renal disease, HFpEF, HTN,hxdiverticulitis, GERD, and benign pulmonary nodules with multiple recent hospitalzations for intractable nausea and vomiting who presented withnausea and vomiting which started after her dialysis session in the hospital 7/22 consistent with previous admissions. She also endorsed worsening cough, orthopnea, PND with elevated D dimer and BNP in the setting of ESRD sxs now improving after HD on 7/24, however intractable n/v is persistent.  #Intractable nausea and vomiting following dialysis -session cut short by 30 min on Monday 01/26/18 d/t n/v -chronic, Phenergan helps minimally -nephrology does not believe this is dialysis dysequilibrium syndrome, maybe a component of chronic uremia -pt reports hx hiatal hernia, 12 week PPi trial started only 2.5 weeks ago -gastric emptying study unremarkable -RUQ US unremarkable pt s/p cholecystectomy Plan: -GI consult - [facility] - apt with Dr. Benson Norway  Monday in outpatient setting -palliative consultation for symptom management and goals of care discussion -Phenergan PRN n/v  #Atypical Chest Pain -pt with chronic atypical chest pain associated with cough most likely 2/2 GERD and MSK strain from cough -SOB, orthopnea, PND, and CXR improved after HD -troponin negative x 3 -reported hx of hiatal hernia with GERD sxs Plan: -TTE for evidence of worsening HF or AS -continue home Protonix -Mucinex DM PRN cough -Nitroglycerin tab PRN chest pain  #ESRD on MWF HD -nephrology consulted- last HD 7/24 -continue regularly scheduled HD -Phenergen ppx prior to HD -continue renal vitamins  #HTN -hypertensive, even after dialysis yesterday -increase amlodipine to 10mg  po qd  #DVT OEH:[id number]  #Dispo:Inpatient admission with LOS 24-48 hrs.   Ceirra Belli, Earnest Conroy, Medical Student 01/29/2018, 1:42 PM  Pager [phone]

## 2018-01-29 NOTE — Progress Notes (Signed)
Patient is a 77 yo female with h/o ESRD on HD with multiple recent admissions for intractable nausea and vomiting.   Asked by Palliative medicine to review this patient's medications for possible induction of N/V/D. Home and Hospital Medications were reviewed.   While any medication has the possibility of inducing N/V/D, the medications that this patient is on that have a listed incidence of N/V/D include:   Velphoro (10% Nausea, 6-24% Diarrhea) Calcitriol (10% Nausea) Amlodipine (3% Nausea)  All other medications she takes have unlisted or insignificant rates of N/V or D.   The patient did say that her symptoms were worse after she was given a new medication by Dialysis (Velphoro?).  Thank you for the consult.   Aries Kasa A. Levada Dy, PharmD, [facility] Pager: [phone] Please utilize Amion for appropriate phone number to reach the unit pharmacist ([place])

## 2018-01-29 NOTE — Progress Notes (Addendum)
[facility] Progress Note   Subjective: Seen post gastric emptying study.  No new complaints. N/v during study.  Ordering lunch to attempt to eat.    Objective Vitals:   01/28/18 1700 01/28/18 2112 01/29/18 0454 01/29/18 0821  BP: (!) 153/73 (!) 150/63 (!) 173/72 (!) 173/71  Pulse: 89 96 (!) 102 (!) 102  Resp: 18 19 17 18   Temp: 97.6 F (36.4 C) 98.4 F (36.9 C) 98.7 F (37.1 C) 97.9 F (36.6 C)  TempSrc: Oral   Oral  SpO2: 98% 92% 95% 99%  Weight:  56.9 kg (125 lb 7.1 oz)    Height:       Physical Exam General:NAD, chronically ill appear female laying in bed Heart:RRR, +9/4 systolic murmur Lungs:CTAB Abdomen:soft, ND Extremities:no LE edema Dialysis Access: LU AVF +b/t   Filed Weights   01/28/18 0825 01/28/18 1231 01/28/18 2112  Weight: 59.8 kg (131 lb 13.4 oz) 56.9 kg (125 lb 7.1 oz) 56.9 kg (125 lb 7.1 oz)    Intake/Output Summary (Last 24 hours) at 01/29/2018 1210 Last data filed at 01/29/2018 0600 Gross per 24 hour  Intake 60 ml  Output 2000 ml  Net -1940 ml    Additional Objective Labs: Basic Metabolic Panel: Recent Labs  Lab 01/26/18 0221 01/27/18 1148 01/28/18 0638  NA 141 140 141  K 4.4 4.0 4.9  CL 102 100 102  CO2 23 24 18*  GLUCOSE 88 86 106*  BUN 42* 22 33*  CREATININE 8.44* 5.34* 6.78*  CALCIUM 9.3 9.8 9.9  PHOS 6.5*  --   --    Liver Function Tests: Recent Labs  Lab 01/25/18 2030 01/26/18 0221  AST 13*  --   ALT 8  --   ALKPHOS 43  --   BILITOT 0.6  --   PROT 6.5  --   ALBUMIN 3.0* 2.9*   Recent Labs  Lab 01/25/18 2030  LIPASE 44   CBC: Recent Labs  Lab 01/25/18 2030 01/26/18 0221 01/27/18 1148 01/28/18 0638  WBC 5.1 5.5 4.6 5.7  HGB 9.8* 9.7* 10.7* 10.4*  HCT 31.9* 31.9* 35.5* 32.9*  MCV 99.4 99.7 98.9 95.6  PLT 181 183 183 189   Blood Culture    Component Value Date/Time   SDES URINE, RANDOM 01/25/2018 2210   SPECREQUEST STERILE CONTAINER 01/25/2018 2210   CULT MULTIPLE SPECIES PRESENT,  SUGGEST RECOLLECTION (A) 01/25/2018 2210   REPTSTATUS 01/27/2018 FINAL 01/25/2018 2210    Cardiac Enzymes: Recent Labs  Lab 01/26/18 0221 01/28/18 1541 01/28/18 1720 01/28/18 2145  TROPONINI <0.03 <0.03 <0.03 <0.03    Lab Results  Component Value Date   INR 1.30 02/17/2017   INR 1.14 09/21/2016   INR 1.06 07/13/2012   Studies/Results: Dg Chest 2 View  Result Date: 01/28/2018 CLINICAL DATA:  Shortness of breath EXAM: CHEST - 2 VIEW COMPARISON:  01/27/2018 FINDINGS: Cardiac shadow is mildly enlarged but stable. Aortic calcifications are again seen. The lungs are well aerated bilaterally. No focal infiltrate or sizable effusion is seen. No bony abnormality noted. IMPRESSION: No active cardiopulmonary disease. Electronically Signed   By: Inez Catalina M.D.   On: 01/28/2018 15:24   Dg Chest 2 View  Result Date: 01/27/2018 CLINICAL DATA:  Five days of nausea and vomiting without diarrhea. The vomiting is occasionally post-tussive. The patient also reports shortness of breath and mid chest pain. History of CHF. EXAM: CHEST - 2 VIEW COMPARISON:  Chest x-ray of January 11, 2018 FINDINGS: The lungs are well-expanded. The  interstitial markings are mildly increased greatest at the right lung base. There is blunting of the posterior costophrenic angles. The heart is mildly enlarged but stable. The pulmonary vascularity is mildly prominent centrally but also stable. There is calcification in the wall of the aortic arch. IMPRESSION: Increased lung markings predominantly in the right infrahilar region are worrisome for pneumonia in the right lower lobe anteriorly. Probable small right pleural effusion. Followup PA and lateral chest X-ray is recommended in 3-4 weeks following trial of antibiotic therapy to ensure resolution and exclude underlying malignancy. Mild cardiomegaly and central pulmonary vascular prominence may reflect low-grade compensated CHF. Thoracic aortic atherosclerosis. Electronically Signed    By: David  Martinique M.D.   On: 01/27/2018 12:32   US Abdomen Complete  Result Date: 01/29/2018 CLINICAL DATA:  Nausea and vomiting EXAM: ABDOMEN ULTRASOUND COMPLETE COMPARISON:  CT 01/25/2018 FINDINGS: Gallbladder: Surgically absent Common bile duct: Diameter: 7 mm Liver: No focal lesion identified. Within normal limits in parenchymal echogenicity. Portal vein is patent on color Doppler imaging with normal direction of blood flow towards the liver. IVC: No abnormality visualized. Pancreas: Visualized portion unremarkable. Spleen: Size and appearance within normal limits. Right Kidney: Length: 4.6 cm. Atrophic. No hydronephrosis or focal abnormality. Left Kidney: Length: 5.8 cm. Atrophic. No hydronephrosis or focal abnormality. Abdominal aorta: No aneurysm visualized. Other findings: Small bilateral pleural effusions incidentally noted IMPRESSION: 1. Status post cholecystectomy. Prominent common bile duct likely due to surgical change 2. Atrophic kidneys.  No hydronephrosis. 3. Small pleural effusions Electronically Signed   By: Donavan Foil M.D.   On: 01/29/2018 01:53    Medications: . sodium chloride    . sodium chloride     . amLODipine  5 mg Oral Daily  . aspirin EC  81 mg Oral Daily  . calcitRIOL  2 mcg Oral Q M,W,F-HD  . Chlorhexidine Gluconate Cloth  6 each Topical Q0600  . diclofenac sodium  4 g Topical QID  . heparin injection (subcutaneous)  5,000 Units Subcutaneous Q8H  . multivitamin  1 tablet Oral QHS  . sodium chloride flush  3 mL Intravenous Q12H  . sucroferric oxyhydroxide  500 mg Oral TID WC    Dialysis Orders: MWF -[facility] 4hrs, BFR300, [id number], EDW 58kg,2K/2.5Ca  Access:LU AVF Heparin3000 Unit bolus Mircera155mcg q2wks - last 01/21/18 Calcitriol70mcg PO qHD Velphoro 500mg  tablet - 1 TID    Assessment/Plan: 1. Chest pain/SOB - Improved. 2. N/V -  ABD [place] & gastric emptying study completed.  To follow up with GI as OP in Monday. 3. ESRD - HD MWF.  Orders written for tomorrow per her regular schedule. Under edw post HD yesterday.  Will likely need to have edw reassessed at d/c.  4. Anemia of CKD- Hgb 10.4, no indication for ESA at this time.  5. Secondary hyperparathyroidism - Ca in goal. Phos ^. Continue binders and VDRA. 6. HTN/volume - BP elevated.  May need further lower of her dry due to weight loss from lack of appetite due to n/v.  7. Nutrition - Alb 2.9. Renal diet with fluid restrictions. Prostat 8. GERD 9. Libby to d/c from renal standpoint, can receive HD at OP unit tomorrow.     Jen Mow, PA-C [facility] Pager: [phone] 01/29/2018,12:10 PM  LOS: 0 days   Pt seen, examined and agree w A/P as above.  Kelly Splinter MD [employer] pager [phone]   01/29/2018, 2:15 PM

## 2018-01-29 NOTE — Progress Notes (Signed)
  Echocardiogram 2D Echocardiogram has been performed.  Madelaine Etienne 01/29/2018, 4:19 PM

## 2018-01-29 NOTE — Progress Notes (Signed)
Pt gone down for gastric emptying study.

## 2018-01-30 DIAGNOSIS — I071 Rheumatic tricuspid insufficiency: Secondary | ICD-10-CM

## 2018-01-30 DIAGNOSIS — N186 End stage renal disease: Secondary | ICD-10-CM | POA: Diagnosis not present

## 2018-01-30 DIAGNOSIS — I2721 Secondary pulmonary arterial hypertension: Secondary | ICD-10-CM

## 2018-01-30 DIAGNOSIS — R112 Nausea with vomiting, unspecified: Secondary | ICD-10-CM

## 2018-01-30 DIAGNOSIS — R918 Other nonspecific abnormal finding of lung field: Secondary | ICD-10-CM | POA: Diagnosis not present

## 2018-01-30 DIAGNOSIS — Z7189 Other specified counseling: Secondary | ICD-10-CM | POA: Diagnosis not present

## 2018-01-30 DIAGNOSIS — Z515 Encounter for palliative care: Secondary | ICD-10-CM | POA: Diagnosis not present

## 2018-01-30 DIAGNOSIS — I132 Hypertensive heart and chronic kidney disease with heart failure and with stage 5 chronic kidney disease, or end stage renal disease: Secondary | ICD-10-CM | POA: Diagnosis not present

## 2018-01-30 DIAGNOSIS — R0789 Other chest pain: Secondary | ICD-10-CM | POA: Diagnosis not present

## 2018-01-30 LAB — RENAL FUNCTION PANEL
Albumin: 3.3 g/dL — ABNORMAL LOW (ref 3.5–5.0)
Anion gap: 14 (ref 5–15)
BUN: 20 mg/dL (ref 8–23)
CHLORIDE: 96 mmol/L — AB (ref 98–111)
CO2: 30 mmol/L (ref 22–32)
CREATININE: 5.66 mg/dL — AB (ref 0.44–1.00)
Calcium: 9.7 mg/dL (ref 8.9–10.3)
GFR calc Af Amer: 8 mL/min — ABNORMAL LOW (ref >60)
GFR, EST NON AFRICAN AMERICAN: 7 mL/min — AB (ref >60)
Glucose, Bld: 104 mg/dL — ABNORMAL HIGH (ref 70–99)
POTASSIUM: 3.2 mmol/L — AB (ref 3.5–5.1)
Phosphorus: 5.2 mg/dL — ABNORMAL HIGH (ref 2.5–4.6)
Sodium: 140 mmol/L (ref 135–145)

## 2018-01-30 LAB — CBC
HEMATOCRIT: 32.9 % — AB (ref 36.0–46.0)
Hemoglobin: 10 g/dL — ABNORMAL LOW (ref 12.0–15.0)
MCH: 29.7 pg (ref 26.0–34.0)
MCHC: 30.4 g/dL (ref 30.0–36.0)
MCV: 97.6 fL (ref 78.0–100.0)
Platelets: 186 10*3/uL (ref 150–400)
RBC: 3.37 MIL/uL — ABNORMAL LOW (ref 3.87–5.11)
RDW: 15 % (ref 11.5–15.5)
WBC: 3.8 10*3/uL — AB (ref 4.0–10.5)

## 2018-01-30 MED ORDER — PROMETHAZINE HCL 25 MG PO TABS
ORAL_TABLET | ORAL | Status: AC
  Filled 2018-01-30: qty 1

## 2018-01-30 MED ORDER — CALCITRIOL 0.5 MCG PO CAPS
ORAL_CAPSULE | ORAL | Status: AC
  Filled 2018-01-30: qty 4

## 2018-01-30 NOTE — Progress Notes (Signed)
   Subjective: Pt seen during dialysis this morning. She denies nausea currently, however states she remained nauseous overnight with vomiting after the gastric emptying study yesterday. She has not had any food since night before last. Pt states her chest pain has improved. She endorses the cough is unchanged. Denies SOB.   Objective:  Vital signs in last 24 hours: Vitals:   01/30/18 1100 01/30/18 1130 01/30/18 1201 01/30/18 1305  BP: (!) 150/61 (!) 153/58 (!) 158/71 (!) 124/56  Pulse: 86 86 86 89  Resp: (!) 21 (!) 22 18 18   Temp:   98.2 F (36.8 C) 98.2 F (36.8 C)  TempSrc:   Oral Oral  SpO2:   99% 99%  Weight:   54.7 kg (120 lb 9.5 oz)   Height:       Physical Exam:  ITG:[id number], NAD Cardio: RRR, S1, S2,3/6 crescendo - decrescendo systolic murmur Resp:LCAB,no increased WOB  Abdominal: soft,no rebound, no guarding,non-tender, +BS Extremities:warm,no pitting edema Neuro: A&O x 3, normal speech  TTE 01/29/18: Normal EF 09-40%, Grade 1 Diastolic dysfunction, mod AS with mild regurgitation, severe pulmonary arterial HTN - PA peak pressure - 68 (worse than previous echo Jan 2019 PAPP - 42), no RV hypertrophy, mild TR regurgitation  Assessment/Plan: Ms. Soza is a 77 yo female with hx of ESRD on M/W/F HD, aortic stenosis with calcific vegetative mass, anemia, secondary hyperparathyroidism 2/2 renal disease, HFpEF, HTN,hxdiverticulitis, GERD, and benign pulmonary nodules with multiple recent hospitalzations for intractable nausea and vomiting who presented withnausea and vomiting which started after her dialysis session in the hospital 7/22 consistent with previous admissions. She also endorsed worsening cough, orthopnea, PND with elevated D dimer and BNP in the setting of ESRD sxs now improving after HD on 7/24, however intractable n/v is persistent.  #Intractable nausea and vomiting following dialysis DDX: --> Renal: chronic, timing associated with HD,  Phenerganhelps minimally  -nephrology does not believe this is dialysis dysequilibrium syndrome, maybe a component of chronic uremia --> GI : pt reports hx hiatal hernia, 12 week PPi trial started only 3 weeks ago  -gastric emptying study unremarkable  -RUQ US unremarkable pt s/p cholecystectomy --> Cardiopulmonary: pt with pulmonary HTN on TTE - possibly causing abdominal fullness / nausea / vomiting Plan: -GI consult- [facility] - apt with Dr. Benson Norway Monday 3:15 at outpatient office after HD (Dr. Benson Norway in procedures Tues/Thurs) -palliative consultationfor symptom management and goals of care discussion -Phenergan PRN n/v -Follow up outpatient cardiology for pulmonary HTN  #Atypical Chest Pain -pt with chronic atypical chest pain associated with cough most likely 2/2 GERD and MSK strain from cough -SOB, orthopnea, PND, and CXR improved after HD -troponin negative x 3 -reported hx of hiatal hernia with GERD sxs Plan: -TTEfor evidence of worsening HF or AS -continue home Protonix -Mucinex DM PRN cough -Nitroglycerin tab PRN chest pain  #ESRD on MWF HD -nephrology consulted-last HD 7/26 -continue regularly scheduled HD -Phenergen ppx prior to HD -continue renal vitamins  #HTN -amlodipine to 10mg  po qd (increased 7/26 from 5mg  qd)  #DVT HWK:[id number]  #Dispo:Inpatient admission with LOS 24-48 hrs   Afrika Brick, Earnest Conroy, Medical Student 01/30/2018, 2:05 PM Pager [phone]

## 2018-01-30 NOTE — Procedures (Signed)
Seen on dialysis, still c/o nausea/ vomiting.  No CP or SOB.  Stable on HD .   I was present at this dialysis session, have reviewed the session itself and made  appropriate changes Kelly Splinter MD [place] pager [phone]   01/30/2018, 3:50 PM

## 2018-01-30 NOTE — Consult Note (Signed)
Consultation Note Date: 01/30/2018   Patient Name: Nancy [name]  DOB: Dec 16, 1940  MRN: [id number]  Age / Sex: 77 y.o., female  PCP: Aldine Contes, MD Referring Physician: Aldine Contes, MD  Reason for Consultation: Establishing goals of care, Non pain symptom management and Psychosocial/spiritual support  HPI/Patient Profile: 77 y.o. female  with past medical history of end-stage renal disease on hemodialysis x10 months, aortic stenosis, hypertension, GERD admitted on 01/27/2018 with shortness of breath, persistent nausea and vomiting, volume overload.  Patient has had 8 hospitalizations in the past 6 months.  She started dialysis approximately 10 months ago, and since that time has been experiencing persistent nausea and vomiting associated with dialysis  Consult ordered for symptom management, persistent nausea and vomiting; goals of care.  Patient has been seen by palliative medicine providers in March 2017 as well as March 2018  Clinical Assessment and Goals of Care: Patient seen, chart reviewed.  No family at the bedside.  Patient had dialysis this morning and is now back in her room.  She is oriented x3 although somewhat sleepy and is requesting to take a nap.  She did agree to speak with me regarding her nausea and vomiting.  She states this was not a problem prior to starting dialysis 10 months ago. She tells me "if they would only let me go 3-1/2 hours I would not have this".  She states when she goes for  entire 4 hours she is nauseated not only during treatment but after and before.  When I attempted to clarify with her how 3-1/2 hours eliminated the problem completely, she stated "I do not know but if they just let me go 3-1/2 hours I do not have the problem at all".  She is unable to articulate anything that improves symptoms except not eating and sticking with clear liquids.  Constipation is not  an issue for her she states.  She normally has a bowel movement daily although she reports last month she had a lot of diarrhea and her nausea was worse then.  When I asked her how she felt about dialysis she shared with me that she does not like it.  I asked her if there is a set of circumstances that she would not undergo dialysis and she replied" no, I will die"  Patient at this point is able to make her own decisions.  In the event that she were unable to, her daughter, [name] would act as her healthcare proxy.    SUMMARY OF RECOMMENDATIONS   DNR/DNI Wishes to continue dialysis but is requesting to go 3-1/2 hours No changes to goals to continue hemodialysis despite persistent nausea and vomiting Code Status/Advance Care Planning:  DNR    Symptom Management:   Nausea: Since patient had a normal gastric emptying study, Reglan would not be of benefit, but Remeron at 15 mg nightly could help with persistent nausea and vomiting as it targets serotonin receptors in the gut  Palliative Prophylaxis:   Aspiration, Bowel Regimen, Delirium Protocol, Eye  Care, Frequent Pain Assessment, Oral Care and Turn Reposition  Additional Recommendations (Limitations, Scope, Preferences):  Full Scope Treatment except for DNR/DNI  Psycho-social/Spiritual:   Desire for further Chaplaincy support:no   Prognosis:   Unable to determine  Discharge Planning: To Be Determined      Primary Diagnoses: Present on Admission: . Volume overload   I have reviewed the medical record, interviewed the patient and family, and examined the patient. The following aspects are pertinent.  Past Medical History:  Diagnosis Date  . Anemia   . Aortic stenosis   . Bacterial sinusitis 09/17/2011  . CHF (congestive heart failure) ([place])   . CKD (chronic kidney disease) stage 4, GFR 15-29 ml/min ([place]) 08/11/2006   Cr continues to increase. Proteinuria on UA 02/10/12.    . Colitis   . CVA (cerebrovascular  accident) [facility])    New hemorrhagic per CT scan '09  . Diverticulosis of colon   . Dysfunctional uterine bleeding   . ESRD (end stage renal disease) on dialysis ([place])    "MWF; [facility]" (11/27/2017)  . Fecal impaction ([place])   . Headache(784.0)   . Heart murmur   . HERNIORRHAPHY, HX OF 08/11/2006  . Hypertension   . OA (osteoarthritis)    bilateral knees  . Postmenopausal   . Pulmonary nodule   . TINEA CRURIS 01/12/2007   Social History   Socioeconomic History  . Marital status: Widowed    Spouse name: Not on file  . Number of children: Not on file  . Years of education: Not on file  . Highest education level: Not on file  Occupational History  . Not on file  Social Needs  . Financial resource strain: Not on file  . Food insecurity:    Worry: Not on file    Inability: Not on file  . Transportation needs:    Medical: Not on file    Non-medical: Not on file  Tobacco Use  . Smoking status: Never Smoker  . Smokeless tobacco: Never Used  Substance and Sexual Activity  . Alcohol use: No    Alcohol/week: 0.0 oz  . Drug use: No    Comment: 08/15/08 UDS + cocaine  . Sexual activity: Not Currently  Lifestyle  . Physical activity:    Days per week: Not on file    Minutes per session: Not on file  . Stress: Not on file  Relationships  . Social connections:    Talks on phone: Not on file    Gets together: Not on file    Attends religious service: Not on file    Active member of club or organization: Not on file    Attends meetings of clubs or organizations: Not on file    Relationship status: Not on file  Other Topics Concern  . Not on file  Social History Narrative   Married, lives with her husband. 1 child.          Family History  Problem Relation Age of Onset  . Hypertension Mother   . Congestive Heart Failure Mother   . Heart attack Brother 69   Scheduled Meds: . amLODipine  10 mg Oral Daily  . aspirin EC  81 mg Oral Daily  . calcitRIOL  2 mcg Oral Q  M,W,F-HD  . Chlorhexidine Gluconate Cloth  6 each Topical Q0600  . diclofenac sodium  4 g Topical QID  . heparin injection (subcutaneous)  5,000 Units Subcutaneous Q8H  . multivitamin  1 tablet Oral QHS  .  sodium chloride flush  3 mL Intravenous Q12H  . sucroferric oxyhydroxide  500 mg Oral TID WC   Continuous Infusions: PRN Meds:.acetaminophen **OR** acetaminophen, dextromethorphan-guaiFENesin, nitroGLYCERIN, polyethylene glycol, promethazine Medications Prior to Admission:  Prior to Admission medications   Medication Sig Start Date End Date Taking? Authorizing Provider  acetaminophen (TYLENOL) 500 MG tablet Take 2 tablets (1,000 mg total) by mouth every 8 (eight) hours as needed for mild pain, moderate pain, fever or headache. 07/22/17  Yes Colbert Ewing, MD  amLODipine (NORVASC) 5 MG tablet Take 1 tablet (5 mg total) by mouth daily. 01/14/18 02/13/18 Yes [name], MD  aspirin EC 81 MG EC tablet Take 1 tablet (81 mg total) by mouth daily. 12/10/16  Yes Valinda Party, DO  calcitRIOL (ROCALTROL) 0.5 MCG capsule Take 4 capsules (2 mcg total) by mouth every Monday, Wednesday, and Friday with hemodialysis. 12/30/17  Yes Neva Seat, MD  dextromethorphan-guaiFENesin [employer] [facility] [employer] DM) 30-600 MG 12hr tablet Take 1 tablet by mouth 2 (two) times daily as needed for up to 30 doses for cough. 01/13/18  Yes [name], MD  diclofenac sodium (VOLTAREN) 1 % GEL Apply 4 g topically 4 (four) times daily. Patient taking differently: Apply 4 g topically 4 (four) times daily as needed (pain).  01/20/18  Yes Mosetta Anis, MD  multivitamin (RENA-VIT) TABS tablet Take 1 tablet by mouth at bedtime. 01/13/18 02/12/18 Yes [name], MD  nitroGLYCERIN (NITROSTAT) 0.3 MG SL tablet Place 1 tablet (0.3 mg total) under the tongue every 5 (five) minutes as needed for chest pain. 02/27/17  Yes Sid Falcon, MD  promethazine (PHENERGAN) 12.5 MG tablet Take 1 tablet (12.5 mg total) by mouth every 6 (six) hours  as needed for nausea or vomiting (Take 1 prior to dialysis). 01/06/18  Yes Molt, Bethany, DO  sucroferric oxyhydroxide (VELPHORO) 500 MG chewable tablet Chew 500 mg by mouth 3 (three) times daily with meals.   Yes Rexene Agent, MD  calcium acetate (PHOSLO) 667 MG capsule Take 2 capsules (1,334 mg total) by mouth 3 (three) times daily with meals. Patient not taking: Reported on 01/25/2018 01/13/18 02/12/18  [name], MD  pantoprazole (PROTONIX) 40 MG tablet Take 1 tablet (40 mg total) by mouth daily. Patient not taking: Reported on 01/27/2018 01/14/18 02/13/18  [name], MD   Allergies  Allergen Reactions  . Hydrocodone Nausea And Vomiting and Other (See Comments)    dizziness   Review of Systems  Unable to perform ROS: Other    Physical Exam  Constitutional:  Frail, ill-appearing elderly female  HENT:  Head: Normocephalic and atraumatic.  Neck: Normal range of motion.  Cardiovascular: Normal rate.  Pulmonary/Chest: Effort normal.  Abdominal: Soft.  Neurological:  Somnolent status post dialysis this morning Oriented x3  Skin: Skin is warm and dry.  Psychiatric:  Patient is withdrawn, somnolent; status post hemodialysis  Nursing note and vitals reviewed.   Vital Signs: BP (!) 124/56 (BP Location: Right Wrist)   Pulse 89   Temp 98.2 F (36.8 C) (Oral)   Resp 18   Ht 5\' 3"  (1.6 m)   Wt 54.7 kg (120 lb 9.5 oz)   SpO2 99%   BMI 21.36 kg/m  Pain Scale: 0-10   Pain Score: 0-No pain   SpO2: SpO2: 99 % O2 Device:SpO2: 99 % O2 Flow Rate: .O2 Flow Rate (L/min): 2 L/min  IO: Intake/output summary:   Intake/Output Summary (Last 24 hours) at 01/30/2018 1550 Last data filed at 01/30/2018 1201 Gross  per 24 hour  Intake 120 ml  Output 2000 ml  Net -1880 ml    LBM: Last BM Date: 01/29/18 Baseline Weight: Weight: 60.8 kg (134 lb 0.6 oz) Most recent weight: Weight: 54.7 kg (120 lb 9.5 oz)     Palliative Assessment/Data:   Flowsheet Rows     Most Recent Value  Intake  Tab  Referral Department  Hospitalist  Unit at Time of Referral  Med/Surg Unit  Palliative Care Primary Diagnosis  Nephrology  Date Notified  01/27/18  Reason for referral  Clarify Goals of Care, Non-pain Symptom, Psychosocial or Spiritual support  Date of Admission  01/27/18  Date first seen by Palliative Care  01/30/18  # of days Palliative referral response time  3 Day(s)  # of days IP prior to Palliative referral  0  Clinical Assessment  Palliative Performance Scale Score  50%  Pain Max last 24 hours  Not able to report  Pain Min Last 24 hours  Not able to report  Dyspnea Max Last 24 Hours  Not able to report  Dyspnea Min Last 24 hours  Not able to report  Nausea Max Last 24 Hours  Not able to report  Nausea Min Last 24 Hours  Not able to report  Anxiety Max Last 24 Hours  Not able to report  Anxiety Min Last 24 Hours  Not able to report  Other Max Last 24 Hours  Not able to report  Psychosocial & Spiritual Assessment  Palliative Care Outcomes  Patient/Family meeting held?  Yes  Who was at the meeting?  pt  Palliative Care follow-up planned  Yes, Facility      Time In: 1500 Time Out: 1600 Time Total: 60 min Greater than 50%  of this time was spent counseling and coordinating care related to the above assessment and plan.  Signed by: Dory Horn, NP   Please contact Palliative Medicine Team phone at [phone] for questions and concerns.  For individual provider: See Shea Evans

## 2018-01-31 DIAGNOSIS — R112 Nausea with vomiting, unspecified: Secondary | ICD-10-CM | POA: Diagnosis not present

## 2018-01-31 MED ORDER — MIRTAZAPINE 15 MG PO TABS
15.0000 mg | ORAL_TABLET | Freq: Every day | ORAL | Status: DC
  Administered 2018-01-31: 15 mg via ORAL
  Filled 2018-01-31: qty 1

## 2018-01-31 NOTE — Progress Notes (Signed)
Patient was under the impression that she would be D/C'd today. MD notified. MD stated that she would be D/C'd tomorrow. Patient and patients daughter updated of this information. Will continue to monitor.

## 2018-01-31 NOTE — Progress Notes (Addendum)
[facility] Progress Note   Subjective:  Nausea/vomiting this am after eating oatmeal Tolerated HD yesterday. No CP/SOB   Objective Vitals:   01/30/18 1305 01/30/18 2158 01/31/18 0614 01/31/18 0942  BP: (!) 124/56 (!) 152/65 (!) 158/80 (!) 163/76  Pulse: 89 89 95 97  Resp: 18 16 20 16   Temp: 98.2 F (36.8 C) 98.2 F (36.8 C) 98.1 F (36.7 C) 98.2 F (36.8 C)  TempSrc: Oral Oral Oral Oral  SpO2: 99% 100% 100% 100%  Weight:  54.5 kg (120 lb 3.2 oz)    Height:       Physical Exam General:NAD, chronically ill appear female laying in bed Heart:RRR, +6/3 systolic murmur Lungs:CTAB Abdomen:soft, ND Extremities:no LE edema Dialysis Access: LU AVF +b/t   Filed Weights   01/30/18 0750 01/30/18 1201 01/30/18 2158  Weight: 56.7 kg (125 lb) 54.7 kg (120 lb 9.5 oz) 54.5 kg (120 lb 3.2 oz)    Intake/Output Summary (Last 24 hours) at 01/31/2018 0946 Last data filed at 01/31/2018 0815 Gross per 24 hour  Intake 720 ml  Output 2000 ml  Net -1280 ml    Additional Objective Labs: Basic Metabolic Panel: Recent Labs  Lab 01/26/18 0221 01/27/18 1148 01/28/18 0638 01/30/18 0711  NA 141 140 141 140  K 4.4 4.0 4.9 3.2*  CL 102 100 102 96*  CO2 23 24 18* 30  GLUCOSE 88 86 106* 104*  BUN 42* 22 33* 20  CREATININE 8.44* 5.34* 6.78* 5.66*  CALCIUM 9.3 9.8 9.9 9.7  PHOS 6.5*  --   --  5.2*   Liver Function Tests: Recent Labs  Lab 01/25/18 2030 01/26/18 0221 01/30/18 0711  AST 13*  --   --   ALT 8  --   --   ALKPHOS 43  --   --   BILITOT 0.6  --   --   PROT 6.5  --   --   ALBUMIN 3.0* 2.9* 3.3*   Recent Labs  Lab 01/25/18 2030  LIPASE 44   CBC: Recent Labs  Lab 01/25/18 2030 01/26/18 0221 01/27/18 1148 01/28/18 0638 01/30/18 0711  WBC 5.1 5.5 4.6 5.7 3.8*  HGB 9.8* 9.7* 10.7* 10.4* 10.0*  HCT 31.9* 31.9* 35.5* 32.9* 32.9*  MCV 99.4 99.7 98.9 95.6 97.6  PLT 181 183 183 189 186   Blood Culture    Component Value Date/Time   SDES URINE,  RANDOM 01/25/2018 2210   SPECREQUEST STERILE CONTAINER 01/25/2018 2210   CULT MULTIPLE SPECIES PRESENT, SUGGEST RECOLLECTION (A) 01/25/2018 2210   REPTSTATUS 01/27/2018 FINAL 01/25/2018 2210    Cardiac Enzymes: Recent Labs  Lab 01/26/18 0221 01/28/18 1541 01/28/18 1720 01/28/18 2145  TROPONINI <0.03 <0.03 <0.03 <0.03    Lab Results  Component Value Date   INR 1.30 02/17/2017   INR 1.14 09/21/2016   INR 1.06 07/13/2012   Studies/Results: Nm Gastric Emptying  Result Date: 01/29/2018 CLINICAL DATA:  Epigastric pain with nausea and vomiting EXAM: NUCLEAR MEDICINE GASTRIC EMPTYING SCAN TECHNIQUE: After oral ingestion of radiolabeled meal, sequential abdominal images were obtained for 120 minutes. Residual percentage of activity remaining within the stomach was calculated at 60 and 120 minutes. RADIOPHARMACEUTICALS:  2.2 mCi Tc-73m sulfur colloid in standardized meal including egg COMPARISON:  None. FINDINGS: Expected location of the stomach in the left upper quadrant. Ingested meal empties the stomach gradually over the course of the study with 9% retention at 60 min and 4% retention at 120 min (normal retention less than 30%  at a 120 min). IMPRESSION: Normal gastric emptying study. Emptying from the stomach is unusually rapid; the significance of this finding is uncertain. Electronically Signed   By: Lowella Grip III M.D.   On: 01/29/2018 12:28    Medications:  . amLODipine  10 mg Oral Daily  . aspirin EC  81 mg Oral Daily  . calcitRIOL  2 mcg Oral Q M,W,F-HD  . Chlorhexidine Gluconate Cloth  6 each Topical Q0600  . diclofenac sodium  4 g Topical QID  . heparin injection (subcutaneous)  5,000 Units Subcutaneous Q8H  . mirtazapine  15 mg Oral QHS  . multivitamin  1 tablet Oral QHS  . sodium chloride flush  3 mL Intravenous Q12H  . sucroferric oxyhydroxide  500 mg Oral TID WC    Dialysis Orders: MWF -[facility] 4hrs, BFR300, [id number], EDW 58kg,2K/2.5Ca  Access:LU  AVF Heparin3000 Unit bolus Mircera113mcg q2wks - last 01/21/18 Calcitriol2mcg PO qHD Velphoro 500mg  tablet - 1 TID    Assessment/Plan: 1. N/V -  ABD [place] & gastric emptying study neg. Etiology unclear. To follow up with GI as OP in Monday. 2. ESRD - HD MWF.  Next HD 7/29.  3. Anemia of CKD- Hgb 10.0, no indication for ESA at this time.  4. Secondary hyperparathyroidism - Ca/Phos ok . Continue binders and VDRA. 5. HTN/volume - BP elevated and getting below EDW. Post wt on 7/26 54.7 kg. Will need further lower of her dry due to weight loss from lack of appetite due to n/v.  6. Nutrition - Alb 2.9. Renal diet with fluid restrictions. Prostat 7. GERD 8. DNR  9. Dispo - pending. To f/u with GI next week   Lynnda Child PA-C [place] Pager [phone] 01/31/2018,9:47 AM  Pt seen, examined and agree w A/P as above.  Kelly Splinter MD [employer] pager [phone]   01/31/2018, 10:37 AM

## 2018-01-31 NOTE — Progress Notes (Signed)
   Subjective: Patient continued to experience nausea, had one vomiting after eating oatmeal this morning.  She wants to go home.  Patient was concerned about her outpatient appointment with GI on Monday, stating that Monday is her dialysis day and she does not have any other view of transportation to go for that appointment after her dialysis.  She asked to call her granddaughter and discussed the plan with her.  Objective:  Vital signs in last 24 hours: Vitals:   01/30/18 1305 01/30/18 2158 01/31/18 0614 01/31/18 0942  BP: (!) 124/56 (!) 152/65 (!) 158/80 (!) 163/76  Pulse: 89 89 95 97  Resp: 18 16 20 16   Temp: 98.2 F (36.8 C) 98.2 F (36.8 C) 98.1 F (36.7 C) 98.2 F (36.8 C)  TempSrc: Oral Oral Oral Oral  SpO2: 99% 100% 100% 100%  Weight:  120 lb 3.2 oz (54.5 kg)    Height:       General.  Well-developed elderly lady, in no acute distress. Lungs.  Clear bilaterally, normal work of breathing. CV.  Regular rate and rhythm with systolic murmur. Abdomen.  Soft, nontender, nondistended, bowel sounds positive. Extremities.  No edema, no cyanosis, pulses intact.  Assessment/Plan: Nancy [name] is a 77 yo female with hx of ESRD on M/W/F HD, aortic stenosis with calcific vegetative mass, anemia, secondary hyperparathyroidism 2/2 renal disease, HFpEF, HTN,hxdiverticulitis, GERD, and benign pulmonary nodules with multiple recent hospitalzations for intractable nausea and vomiting who presentedwithnausea and vomiting which started after her dialysis session in the hospital 7/22consistent with previous admissions. She also endorsedworsening cough, orthopnea, PND with elevated D dimer and BNP in the setting of ESRD sxs now improving after HD on 7/24, however intractable n/v is persistent.  #Intractable nausea and vomiting following dialysis.  Of unclear etiology, although work-up so far is being negative.  Patient continued to experience these symptoms. Patient had pulmonary hypertension on  echo done on July 25, talked with Dr. Haroldine Laws from cardiology, who reviewed the slides from her echo, there was no right heart dilatation or strain, IVC was normal, it is very unlikely that she has some abdominal fullness resulting in her symptoms due to this pulmonary hypertension.  Patients with dialysis do experience some pulmonary hypertension, he does not think that doing a right heart catheterization will make any changes to her symptoms or management. -She needs to keep her appointment with Dr. Almyra Free on Monday at 3:15 PM. There was some concern about her dialysis as she normally goes for dialysis on Monday, Wednesday and Friday at 11 AM. -I am trying to reach nephrology before the solution, either dialysis early on Monday or dialyze her tomorrow before going home-waiting for a response from Dr. Soyla Murphy. -Continue Phenergan. -Add Remeron at bedtime. -Continue Protonix. -Most likely be home tomorrow so she can go see Dr. Benson Norway.  Atypical chest pain.  Currently resolved.  ESRD.  No sign of volume overload. Continue scheduled dialysis on Monday, Wednesday and Friday.  Hypertension.  Continued to have elevated blood pressure. Amlodipine was increased to 10 mg yesterday-we will continue with that. Continue monitoring.  Dispo: Anticipated discharge in approximately 1-2 day(s).   Nancy Nimrod, MD 01/31/2018, 3:05 PM Pager: [phone]

## 2018-01-31 NOTE — Procedures (Deleted)
   I was present at this dialysis session, have reviewed the session itself and made  appropriate changes Kelly Splinter MD [place] pager [phone]   01/30/2018, 10:38 AM

## 2018-02-01 DIAGNOSIS — Z885 Allergy status to narcotic agent status: Secondary | ICD-10-CM

## 2018-02-01 DIAGNOSIS — R0789 Other chest pain: Secondary | ICD-10-CM | POA: Diagnosis not present

## 2018-02-01 DIAGNOSIS — R112 Nausea with vomiting, unspecified: Secondary | ICD-10-CM | POA: Diagnosis not present

## 2018-02-01 DIAGNOSIS — Z6821 Body mass index (BMI) 21.0-21.9, adult: Secondary | ICD-10-CM

## 2018-02-01 DIAGNOSIS — N186 End stage renal disease: Secondary | ICD-10-CM | POA: Diagnosis not present

## 2018-02-01 DIAGNOSIS — I132 Hypertensive heart and chronic kidney disease with heart failure and with stage 5 chronic kidney disease, or end stage renal disease: Secondary | ICD-10-CM | POA: Diagnosis not present

## 2018-02-01 DIAGNOSIS — I5030 Unspecified diastolic (congestive) heart failure: Secondary | ICD-10-CM | POA: Diagnosis not present

## 2018-02-01 DIAGNOSIS — R630 Anorexia: Secondary | ICD-10-CM

## 2018-02-01 MED ORDER — PROMETHAZINE HCL 12.5 MG PO TABS: 12.5000 mg | ORAL_TABLET | Freq: Four times a day (QID) | ORAL | 0 refills | Status: DC | PRN

## 2018-02-01 MED ORDER — AMLODIPINE BESYLATE 10 MG PO TABS: 10.0000 mg | ORAL_TABLET | Freq: Every day | ORAL | 2 refills | Status: DC

## 2018-02-01 MED ORDER — MIRTAZAPINE 15 MG PO TABS: 15.0000 mg | ORAL_TABLET | Freq: Every day | ORAL | 2 refills | Status: DC

## 2018-02-01 NOTE — Discharge Summary (Signed)
Name: Nancy [name] MRN: [id number] DOB: Sep 20, 1940 77 y.o. PCP: Aldine Contes, MD  Date of Admission: 01/27/2018 11:43 AM Date of Discharge:  Attending Physician: Aldine Contes, MD  Discharge Diagnosis: 1.  Nausea, vomiting and anorexia of unclear etiology. 2. ESRD  Discharge Medications: Allergies as of 02/01/2018      Reactions   Hydrocodone Nausea And Vomiting, Other (See Comments)   dizziness      Medication List    STOP taking these medications   calcium acetate 667 MG capsule Commonly known as:  PHOSLO   dextromethorphan-guaiFENesin 30-600 MG 12hr tablet Commonly known as:  MUCINEX DM     TAKE these medications   acetaminophen 500 MG tablet Commonly known as:  TYLENOL Take 2 tablets (1,000 mg total) by mouth every 8 (eight) hours as needed for mild pain, moderate pain, fever or headache.   amLODipine 10 MG tablet Commonly known as:  NORVASC Take 1 tablet (10 mg total) by mouth daily. Start taking on:  02/02/2018 What changed:    medication strength  how much to take   aspirin 81 MG EC tablet Take 1 tablet (81 mg total) by mouth daily.   calcitRIOL 0.5 MCG capsule Commonly known as:  ROCALTROL Take 4 capsules (2 mcg total) by mouth every Monday, Wednesday, and Friday with hemodialysis.   diclofenac sodium 1 % Gel Commonly known as:  VOLTAREN Apply 4 g topically 4 (four) times daily. What changed:    when to take this  reasons to take this   mirtazapine 15 MG tablet Commonly known as:  REMERON Take 1 tablet (15 mg total) by mouth at bedtime.   multivitamin Tabs tablet Take 1 tablet by mouth at bedtime.   nitroGLYCERIN 0.3 MG SL tablet Commonly known as:  NITROSTAT Place 1 tablet (0.3 mg total) under the tongue every 5 (five) minutes as needed for chest pain.   pantoprazole 40 MG tablet Commonly known as:  PROTONIX Take 1 tablet (40 mg total) by mouth daily.   promethazine 12.5 MG tablet Commonly known as:  PHENERGAN Take 1  tablet (12.5 mg total) by mouth every 6 (six) hours as needed for nausea or vomiting (Take 1 prior to dialysis). Take 1 tablet half an hour before meal. What changed:  additional instructions   VELPHORO 500 MG chewable tablet Generic drug:  sucroferric oxyhydroxide Chew 500 mg by mouth 3 (three) times daily with meals.       Disposition and follow-up:   [name] was discharged from [facility] in Good condition.  At the hospital follow up visit please address:  1.  Improvement in her nausea, vomiting and anorexia. -Whether she saw her gastroenterologist, her appointment was on Monday, February 02, 2018 at 3:15 PM. -We added Remeron at bedtime with decrease in nausea and vomiting see if she continued to have that effect.  2.  Labs / imaging needed at time of follow-up: None  3.  Pending labs/ test needing follow-up: None  Follow-up Appointments: Follow-up Information    Aldine Contes, MD. Schedule an appointment as soon as possible for a visit.   Specialty:  Internal Medicine Why:  To be seen next week. Contact information: [address] [place] [address] [phone]        Carol Ada, MD. Go on 02/02/2018.   Specialty:  Gastroenterology Why:  At 3:15 PM. Contact information: [address], [place] [address] [phone]  Hospital Course by problem list: Ms. Tassinari is a 77 yo female with hx of ESRD on M/W/F HD, aortic stenosis with calcific vegetative mass, anemia, secondary hyperparathyroidism 2/2 renal disease, HFpEF, HTN,hxdiverticulitis, GERD, and benign pulmonary nodules with multiple recent hospitalzations for intractable nausea and vomiting who presentedwithnausea and vomiting which started after her dialysis session in the hospital 7/22consistent with previous admissions. She also endorsedworsening cough, orthopnea, PND with elevated D dimer and BNP in the setting of ESRD    Intractable nausea and vomiting following dialysis.  Urology remained unclear.  She has been worked up for mesenteric ischemia, gastroparesis, CT abdomen, abdominal ultrasound with unremarkable results.  She might be experiencing some baseline uremia causing nausea and vomiting.  Her phosphate binder was changed thinking that they might be responsible for her symptoms.  She was made an outpatient appointment with Dr. Benson Norway for February 02, 2018 at 3:15 PM.  Patient and her granddaughter was advised to keep that appointment. Remeron at bedtime was added during current hospitalization, resulted in decreased nausea in the morning and she was able to tolerate her breakfast. She was discharged home on Phenergan to be taken 20 to 30 minutes before meals and Remeron at bedtime. She will follow-up with GI for further evaluation and management. Will continue Protonix.  Atypical chest pain.  Patient came with atypical chest pain, orthopnea PND.  Her symptoms resolved with dialysis.  No recurrence of chest pain.  EKG remained unchanged with negative troponin.  ESRD.No sign of volume overload. Continue scheduled dialysis on Monday, Wednesday and Friday. A request was made for an early morning first shift on Monday for dialysis so she can go to her GI appointment.  The patient has to call her dialysis center at 5 AM to confirm.  Hypertension.  She remained elevated most of the time, her home dose of amlodipine was increased to 10 mg daily from 5 mg.  Her blood pressure did improve but remain mildly elevated.  Her PCP should be able to monitor and titrate her medications as needed.  Discharge Vitals:   BP (!) 143/59 (BP Location: Right Arm)   Pulse 85   Temp 97.8 F (36.6 C) (Oral)   Resp 18   Ht 5\' 3"  (1.6 m)   Wt 120 lb 8 oz (54.7 kg)   SpO2 100%   BMI 21.35 kg/m   General.Well-developed elderly lady, sitting comfortably, in no acute distress. Lungs.Clear bilaterally, normal work of  breathing. CV.Regular rate and rhythm with systolic murmur. Abdomen.Soft, nontender, nondistended, bowel sounds positive. Extremities.No edema, no cyanosis, pulses intact.  Pertinent Labs, Studies, and Procedures:  Recent Labs  Lab 01/25/18 2030 01/26/18 0221 01/27/18 1148 01/28/18 0638 01/30/18 0711  NA 141 141 140 141 140  K 4.3 4.4 4.0 4.9 3.2*  CL 101 102 100 102 96*  CO2 27 23 24  18* 30  GLUCOSE 98 88 86 106* 104*  BUN 40* 42* 22 33* 20  CREATININE 8.28* 8.44* 5.34* 6.78* 5.66*  CALCIUM 9.8 9.3 9.8 9.9 9.7  PHOS  --  6.5*  --   --  5.2*   Recent Labs  Lab 01/27/18 1148 01/28/18 0638 01/30/18 0711  HGB 10.7* 10.4* 10.0*  HCT 35.5* 32.9* 32.9*  WBC 4.6 5.7 3.8*  PLT 183 189 186   Trop. <0.03 X 3.  Gastric emptying studies. FINDINGS: Expected location of the stomach in the left upper quadrant. Ingested meal empties the stomach gradually over the course of the study with 9% retention at  60 min and 4% retention at 120 min (normal retention less than 30% at a 120 min).  IMPRESSION: Normal gastric emptying study. Emptying from the stomach is unusually rapid; the significance of this finding is uncertain.  US abdomen. FINDINGS: Gallbladder: Surgically absent  Common bile duct: Diameter: 7 mm  Liver: No focal lesion identified. Within normal limits in parenchymal echogenicity. Portal vein is patent on color Doppler imaging with normal direction of blood flow towards the liver.  IVC: No abnormality visualized.  Pancreas: Visualized portion unremarkable.  Spleen: Size and appearance within normal limits.  Right Kidney: Length: 4.6 cm. Atrophic. No hydronephrosis or focal abnormality.  Left Kidney: Length: 5.8 cm. Atrophic. No hydronephrosis or focal abnormality.  Abdominal aorta: No aneurysm visualized.  Other findings: Small bilateral pleural effusions incidentally noted  IMPRESSION: 1. Status post cholecystectomy. Prominent common  bile duct likely due to surgical change 2. Atrophic kidneys.  No hydronephrosis. 3. Small pleural effusions  CT abdomen. FINDINGS: Lower chest: No acute findings.  Hepatobiliary: No mass visualized on this unenhanced exam. Prior cholecystectomy. No evidence of biliary obstruction.  Pancreas: No mass or inflammatory process visualized on this unenhanced exam.  Spleen:  Within normal limits in size.  Adrenals/Urinary tract: Bilateral diffuse renal parenchymal atrophy again seen. No evidence of urolithiasis or hydronephrosis.  Stomach/Bowel: No evidence of obstruction, inflammatory process, or abnormal fluid collections. Normal appendix visualized. Diverticulosis is seen mainly involving the descending and sigmoid colon, however there is no evidence of diverticulitis.  Vascular/Lymphatic: No pathologically enlarged lymph nodes identified. No evidence of abdominal aortic aneurysm. Aortic atherosclerosis.  Reproductive: Mildly enlarged uterus with several fibroids shows no significant change. Adnexal regions are unremarkable.  Other:  None.  Musculoskeletal:  No suspicious bone lesions identified.  IMPRESSION: No acute findings.  Colonic diverticulosis, without radiographic evidence of diverticulitis.  Stable small uterine fibroids.  Stable diffuse bilateral renal parenchymal atrophy. No evidence of hydronephrosis.  Discharge Instructions: Discharge Instructions    Call MD for:  persistant nausea and vomiting   Complete by:  As directed    Diet - low sodium heart healthy   Complete by:  As directed    Discharge instructions   Complete by:  As directed    It was pleasure taking care of you. As we discussed you will call your dialysis center tomorrow morning at 5 AM, to go for their first shift dialysis.  This arrangement has been made so you can go for your gastroenterology appointment tomorrow at 3:15 PM. Please follow the directions of your  gastroenterologist. We also added another medicine called Remeron 15 mg to be taken at bedtime to help with your nausea and decreased appetite. You can take Phenergan 12.5 mg half an hour before eating your meal to prevent any nausea or vomiting. Call your PCP office to be seen within next week.   Increase activity slowly   Complete by:  As directed       Signed: [name], MD 02/01/2018, 12:15 PM   Pager: [phone]

## 2018-02-01 NOTE — Progress Notes (Signed)
   Subjective: Patient was feeling better when seen this morning.  Denies any nausea or vomiting this morning.  Was able to tolerate her dinner and breakfast without any further vomiting or nausea.  Objective:  Vital signs in last 24 hours: Vitals:   01/31/18 2227 02/01/18 0516 02/01/18 0739 02/01/18 1018  BP: (!) 158/72 (!) 139/59 (!) 149/60 (!) 143/59  Pulse: 86 79 78 85  Resp: 18 18  18   Temp: 98.3 F (36.8 C) 98.1 F (36.7 C) 97.7 F (36.5 C) 97.8 F (36.6 C)  TempSrc: Oral Oral Oral Oral  SpO2: 100% 98% 100% 100%  Weight: 120 lb 8 oz (54.7 kg)     Height:       General.  Well-developed elderly lady, sitting comfortably, in no acute distress. Lungs.  Clear bilaterally, normal work of breathing. CV.  Regular rate and rhythm with systolic murmur. Abdomen.  Soft, nontender, nondistended, bowel sounds positive. Extremities.  No edema, no cyanosis, pulses intact.  Assessment/Plan:  [name] is a 77 yo female with hx of ESRD on M/W/F HD, aortic stenosis with calcific vegetative mass, anemia, secondary hyperparathyroidism 2/2 renal disease, HFpEF, HTN,hxdiverticulitis, GERD, and benign pulmonary nodules with multiple recent hospitalzations for intractable nausea and vomiting who presentedwithnausea and vomiting which started after her dialysis session in the hospital 7/22consistent with previous admissions. She also endorsedworsening cough, orthopnea, PND with elevated D dimer and BNP in the setting of ESRD sxs now improving after HD on 7/24, however intractable n/v is persistent.  #Intractable nausea and vomiting following dialysis.   No nausea or vomiting today.  Etiology of her chronic nausea and vomiting remain unclear. Patient needs to follow-up with GI as an outpatient for further evaluation.  She will be discharged home today, will see Dr. Benson Norway at 3:15 PM tomorrow. Her dialysis was moved to first shift tomorrow morning at her outpatient dialysis center to accommodate her  appointment with GI.  Talked with her granddaughter yesterday, who took the responsibility to take her for both appointments tomorrow. We added Remeron yesterday-we will continue 15 mg at bedtime. She was advised to take Phenergan before food at home until she sees gastroenterologist. She will continue Protonix.  ESRD.  No sign of volume overload. Continue scheduled dialysis on Monday, Wednesday and Friday. A request was made for an early morning first shift on Monday for dialysis so she can go to her GI appointment.  The patient has to call her dialysis center at 5 AM to confirm.  Atypical chest pain.  Resolved.  Hypertension.  Mildly elevated blood pressure, improved than before. We will continue amlodipine at 10 mg daily.  Dispo: Anticipated discharge in approximately 0 day(s).   Nancy Nimrod, MD 02/01/2018, 11:48 AM Pager: [phone]

## 2018-02-01 NOTE — Progress Notes (Addendum)
[facility] Progress Note   Subjective:  Ate breakfast - no N/V this am. Wants to go home  Objective Vitals:   01/31/18 1823 01/31/18 2227 02/01/18 0516 02/01/18 0739  BP: (!) 174/76 (!) 158/72 (!) 139/59 (!) 149/60  Pulse: 90 86 79 78  Resp: 16 18 18    Temp: 97.9 F (36.6 C) 98.3 F (36.8 C) 98.1 F (36.7 C) 97.7 F (36.5 C)  TempSrc: Oral Oral Oral Oral  SpO2: 99% 100% 98% 100%  Weight:  54.7 kg (120 lb 8 oz)    Height:       Physical Exam General:NAD, chronically ill appear female laying in bed Heart:RRR, +6/2 systolic murmur Lungs:CTAB Abdomen:soft, ND Extremities:no LE edema Dialysis Access: LU AVF +b/t   Filed Weights   01/30/18 1201 01/30/18 2158 01/31/18 2227  Weight: 54.7 kg (120 lb 9.5 oz) 54.5 kg (120 lb 3.2 oz) 54.7 kg (120 lb 8 oz)    Intake/Output Summary (Last 24 hours) at 02/01/2018 0921 Last data filed at 02/01/2018 0800 Gross per 24 hour  Intake 173 ml  Output 0 ml  Net 173 ml    Additional Objective Labs: Basic Metabolic Panel: Recent Labs  Lab 01/26/18 0221 01/27/18 1148 01/28/18 0638 01/30/18 0711  NA 141 140 141 140  K 4.4 4.0 4.9 3.2*  CL 102 100 102 96*  CO2 23 24 18* 30  GLUCOSE 88 86 106* 104*  BUN 42* 22 33* 20  CREATININE 8.44* 5.34* 6.78* 5.66*  CALCIUM 9.3 9.8 9.9 9.7  PHOS 6.5*  --   --  5.2*   Liver Function Tests: Recent Labs  Lab 01/25/18 2030 01/26/18 0221 01/30/18 0711  AST 13*  --   --   ALT 8  --   --   ALKPHOS 43  --   --   BILITOT 0.6  --   --   PROT 6.5  --   --   ALBUMIN 3.0* 2.9* 3.3*   Recent Labs  Lab 01/25/18 2030  LIPASE 44   CBC: Recent Labs  Lab 01/25/18 2030 01/26/18 0221 01/27/18 1148 01/28/18 0638 01/30/18 0711  WBC 5.1 5.5 4.6 5.7 3.8*  HGB 9.8* 9.7* 10.7* 10.4* 10.0*  HCT 31.9* 31.9* 35.5* 32.9* 32.9*  MCV 99.4 99.7 98.9 95.6 97.6  PLT 181 183 183 189 186   Blood Culture    Component Value Date/Time   SDES URINE, RANDOM 01/25/2018 2210   SPECREQUEST  STERILE CONTAINER 01/25/2018 2210   CULT MULTIPLE SPECIES PRESENT, SUGGEST RECOLLECTION (A) 01/25/2018 2210   REPTSTATUS 01/27/2018 FINAL 01/25/2018 2210    Cardiac Enzymes: Recent Labs  Lab 01/26/18 0221 01/28/18 1541 01/28/18 1720 01/28/18 2145  TROPONINI <0.03 <0.03 <0.03 <0.03    Lab Results  Component Value Date   INR 1.30 02/17/2017   INR 1.14 09/21/2016   INR 1.06 07/13/2012   Studies/Results: No results found.  Medications:  . amLODipine  10 mg Oral Daily  . aspirin EC  81 mg Oral Daily  . calcitRIOL  2 mcg Oral Q M,W,F-HD  . Chlorhexidine Gluconate Cloth  6 each Topical Q0600  . diclofenac sodium  4 g Topical QID  . heparin injection (subcutaneous)  5,000 Units Subcutaneous Q8H  . mirtazapine  15 mg Oral QHS  . multivitamin  1 tablet Oral QHS  . sodium chloride flush  3 mL Intravenous Q12H  . sucroferric oxyhydroxide  500 mg Oral TID WC    Dialysis Orders: MWF -[employer], BFR300,  GTX646, EDW 58kg,2K/2.5Ca  Access:LU AVF Heparin3000 Unit bolus Mircera157mcg q2wks - last 01/21/18 Calcitriol71mcg PO qHD Velphoro 500mg  tablet - 1 TID   Assessment/Plan: 1. N/V -  ABD [place] & gastric emptying study neg. Etiology unclear. Outpatient GI appt on Monday 7/29.  2. ESRD - HD MWF.  Next HD 7/29. CN at outpatient center informed that patient will need 1st shift chair time on Monday - unable to give specific chair time until Monday am. Pt made aware can call for earlier chair time Monday.  3. Anemia of CKD- Hgb 10.0, no indication for ESA at this time.  4. Secondary hyperparathyroidism - Ca/Phos ok . Continue binders and VDRA. 5. HTN/volume - BP elevated and getting below EDW. Post wt on 7/26 54.7 kg. Will have lower dry weight at discharge due to weight loss from lack of appetite due to n/v.  6. Nutrition -  Renal diet with fluid restrictions. Prostat for low albumin  7. GERD 8. DNR  9. [place] for d/c from renal standpoint.   Lynnda Child PA-C [facility] Pager [phone] 02/01/2018,9:21 AM  Pt seen, examined and agree w A/P as above.  Kelly Splinter MD [employer] pager [phone]   02/01/2018, 11:14 AM

## 2018-02-01 NOTE — Progress Notes (Signed)
Internal Medicine Attending:   I saw and examined the patient. I reviewed the resident's note and I agree with the resident's findings and plan as documented in the resident's note.  Patient feels well today with no new complaints.  Patient denies any nausea or vomiting today.  She was able to tolerate her breakfast as well as her dinner last night.  Patient was admitted with recurrent nausea and vomiting following her hemodialysis.  The etiology of her recurrent symptoms remain uncertain at this time.  Patient to follow-up with GI tomorrow afternoon.  We added Remeron to her regimen and she will continue this nightly to see if this would help her with her nausea.  Patient had gastric imaging study today which actually showed rapid transition but no evidence of gastroparesis.  No further work-up at this time.  Patient stable for discharge home today.  Patient to continue with hemodialysis per nephrology.

## 2018-02-01 NOTE — Progress Notes (Signed)
[place] to be D/C'd Home per MD order.  Discussed prescriptions and follow up appointments with the patient. Prescriptions given to patient, medication list explained in detail. Pt verbalized understanding.  Allergies as of 02/01/2018      Reactions   Hydrocodone Nausea And Vomiting, Other (See Comments)   dizziness      Medication List    STOP taking these medications   calcium acetate 667 MG capsule Commonly known as:  PHOSLO   dextromethorphan-guaiFENesin 30-600 MG 12hr tablet Commonly known as:  MUCINEX DM     TAKE these medications   acetaminophen 500 MG tablet Commonly known as:  TYLENOL Take 2 tablets (1,000 mg total) by mouth every 8 (eight) hours as needed for mild pain, moderate pain, fever or headache.   amLODipine 10 MG tablet Commonly known as:  NORVASC Take 1 tablet (10 mg total) by mouth daily. Start taking on:  02/02/2018 What changed:    medication strength  how much to take   aspirin 81 MG EC tablet Take 1 tablet (81 mg total) by mouth daily.   calcitRIOL 0.5 MCG capsule Commonly known as:  ROCALTROL Take 4 capsules (2 mcg total) by mouth every Monday, Wednesday, and Friday with hemodialysis.   diclofenac sodium 1 % Gel Commonly known as:  VOLTAREN Apply 4 g topically 4 (four) times daily. What changed:    when to take this  reasons to take this   mirtazapine 15 MG tablet Commonly known as:  REMERON Take 1 tablet (15 mg total) by mouth at bedtime.   multivitamin Tabs tablet Take 1 tablet by mouth at bedtime.   nitroGLYCERIN 0.3 MG SL tablet Commonly known as:  NITROSTAT Place 1 tablet (0.3 mg total) under the tongue every 5 (five) minutes as needed for chest pain.   pantoprazole 40 MG tablet Commonly known as:  PROTONIX Take 1 tablet (40 mg total) by mouth daily.   promethazine 12.5 MG tablet Commonly known as:  PHENERGAN Take 1 tablet (12.5 mg total) by mouth every 6 (six) hours as needed for nausea or vomiting (Take 1 prior  to dialysis). Take 1 tablet half an hour before meal. What changed:  additional instructions   VELPHORO 500 MG chewable tablet Generic drug:  sucroferric oxyhydroxide Chew 500 mg by mouth 3 (three) times daily with meals.       Vitals:   02/01/18 0739 02/01/18 1018  BP: (!) 149/60 (!) 143/59  Pulse: 78 85  Resp:  18  Temp: 97.7 F (36.5 C) 97.8 F (36.6 C)  SpO2: 100% 100%    Skin clean, dry and intact without evidence of skin break down, no evidence of skin tears noted. IV catheter discontinued intact. Site without signs and symptoms of complications. Dressing and pressure applied. Pt denies pain at this time. No complaints noted.  An After Visit Summary was printed and given to the patient. Patient escorted via [place], and D/C home via private auto.  Dixie Dials RN, BSN

## 2018-02-02 ENCOUNTER — Telehealth: Payer: Self-pay | Admitting: Internal Medicine

## 2018-02-02 ENCOUNTER — Ambulatory Visit: Payer: Self-pay | Admitting: Pharmacist

## 2018-02-02 DIAGNOSIS — D631 Anemia in chronic kidney disease: Secondary | ICD-10-CM | POA: Diagnosis not present

## 2018-02-02 DIAGNOSIS — N2581 Secondary hyperparathyroidism of renal origin: Secondary | ICD-10-CM | POA: Diagnosis not present

## 2018-02-02 DIAGNOSIS — N186 End stage renal disease: Secondary | ICD-10-CM | POA: Diagnosis not present

## 2018-02-02 DIAGNOSIS — D689 Coagulation defect, unspecified: Secondary | ICD-10-CM | POA: Diagnosis not present

## 2018-02-02 DIAGNOSIS — R51 Headache: Secondary | ICD-10-CM | POA: Diagnosis not present

## 2018-02-02 DIAGNOSIS — D509 Iron deficiency anemia, unspecified: Secondary | ICD-10-CM | POA: Diagnosis not present

## 2018-02-02 NOTE — Telephone Encounter (Signed)
Patient should follow up with GI when possible. It is not urgent

## 2018-02-02 NOTE — Telephone Encounter (Signed)
Pt was unable to make the gastro appt today, her daughter wants to know is it urgent that she see the gastro because they couldn't reschedule her appt until the end of Aug. Pt contact# [phone] bates)

## 2018-02-02 NOTE — Telephone Encounter (Signed)
Daughter notified. Hubbard Hartshorn, RN, BSN

## 2018-02-03 ENCOUNTER — Ambulatory Visit: Payer: Self-pay

## 2018-02-03 ENCOUNTER — Encounter: Payer: Self-pay | Admitting: Internal Medicine

## 2018-02-03 ENCOUNTER — Other Ambulatory Visit: Payer: Self-pay | Admitting: Pharmacist

## 2018-02-04 ENCOUNTER — Encounter: Payer: Self-pay | Admitting: Pharmacist

## 2018-02-04 DIAGNOSIS — I150 Renovascular hypertension: Secondary | ICD-10-CM | POA: Diagnosis not present

## 2018-02-04 DIAGNOSIS — R51 Headache: Secondary | ICD-10-CM | POA: Diagnosis not present

## 2018-02-04 DIAGNOSIS — N2581 Secondary hyperparathyroidism of renal origin: Secondary | ICD-10-CM | POA: Diagnosis not present

## 2018-02-04 DIAGNOSIS — N186 End stage renal disease: Secondary | ICD-10-CM | POA: Diagnosis not present

## 2018-02-04 DIAGNOSIS — Z992 Dependence on renal dialysis: Secondary | ICD-10-CM | POA: Diagnosis not present

## 2018-02-04 DIAGNOSIS — D631 Anemia in chronic kidney disease: Secondary | ICD-10-CM | POA: Diagnosis not present

## 2018-02-04 DIAGNOSIS — D509 Iron deficiency anemia, unspecified: Secondary | ICD-10-CM | POA: Diagnosis not present

## 2018-02-04 DIAGNOSIS — D689 Coagulation defect, unspecified: Secondary | ICD-10-CM | POA: Diagnosis not present

## 2018-02-04 NOTE — Patient Outreach (Signed)
[facility]) Care Management  [place]   02/04/2018  [place] B Lagrange 11/26/1940 [id number]  Subjective: Patient was called regarding medication review post discharge.  HIPAA identifiers were obtained.  Patient is a 77 year old female with multiple medical conditions including but not limited to: anemia, angina, ESRD on hemodialysis, GERD, hypertension, hyperlipidemia, and osteoarthritis.  Patient was recently hospitalized for protracted nausea and vomiting and then rehospitalized less than 48 hours later for chest pain, nausea/vomiting.  Patient manages her medications on her own with the help of her granddaughter.   Objective:   Encounter Medications: Outpatient Encounter Medications as of 02/03/2018  Medication Sig Note  . acetaminophen (TYLENOL) 500 MG tablet Take 2 tablets (1,000 mg total) by mouth every 8 (eight) hours as needed for mild pain, moderate pain, fever or headache.   Marland Kitchen amLODipine (NORVASC) 10 MG tablet Take 1 tablet (10 mg total) by mouth daily.   Marland Kitchen aspirin EC 81 MG EC tablet Take 1 tablet (81 mg total) by mouth daily.   . calcitRIOL (ROCALTROL) 0.5 MCG capsule Take 4 capsules (2 mcg total) by mouth every Monday, Wednesday, and Friday with hemodialysis. 01/15/2018: Given at dailysis  . diclofenac sodium (VOLTAREN) 1 % GEL Apply 4 g topically 4 (four) times daily. (Patient taking differently: Apply 4 g topically 4 (four) times daily as needed (pain). )   . mirtazapine (REMERON) 15 MG tablet Take 1 tablet (15 mg total) by mouth at bedtime.   . multivitamin (RENA-VIT) TABS tablet Take 1 tablet by mouth at bedtime.   . nitroGLYCERIN (NITROSTAT) 0.3 MG SL tablet Place 1 tablet (0.3 mg total) under the tongue every 5 (five) minutes as needed for chest pain.   . pantoprazole (PROTONIX) 40 MG tablet Take 1 tablet (40 mg total) by mouth daily. (Patient not taking: Reported on 01/27/2018)   . promethazine (PHENERGAN) 12.5 MG tablet Take 1 tablet (12.5 mg  total) by mouth every 6 (six) hours as needed for nausea or vomiting (Take 1 prior to dialysis). Take 1 tablet half an hour before meal.   . sucroferric oxyhydroxide (VELPHORO) 500 MG chewable tablet Chew 500 mg by mouth 3 (three) times daily with meals.    No facility-administered encounter medications on file as of 02/03/2018.     Functional Status: In your present state of health, do you have any difficulty performing the following activities: 01/28/2018 01/27/2018  Hearing? N N  Vision? N N  Difficulty concentrating or making decisions? N N  Walking or climbing stairs? Y -  Dressing or bathing? N N  Doing errands, shopping? Y N  Some recent data might be hidden    Fall/Depression Screening: Fall Risk  01/20/2018 01/06/2018 12/25/2017  Falls in the past year? Yes Yes Yes  Number falls in past yr: 1 1 1   Injury with Fall? No No No  Risk Factor Category  - - -  Risk for fall due to : Impaired mobility;Impaired balance/gait;History of fall(s);Impaired vision Impaired balance/gait;Impaired mobility;Impaired vision;History of fall(s) -  Risk for fall due to: Comment - - -  Follow up Falls prevention discussed Falls prevention discussed -   PHQ 2/9 Scores 01/20/2018 01/06/2018 12/25/2017 12/24/2017 10/30/2017 08/07/2017 07/31/2017  PHQ - 2 Score 0 0 0 0 0 0 0      Assessment: ASSESSMENT: Date Discharged from Hospital: 01/27/18 Date Medication Reconciliation Performed: 02/04/2018  Medications Discontinued at Discharge: (Delete if not applicable)  Guaifenesin/DM  Calcium Acetate 667 mg  New Medications  at Discharge: (delete if applicable)  Mirtazapine   Patient was recently discharged from hospital and all medications have been reviewed   Drugs sorted by system:  Psych/Neuro Mirtazapine  Cardiovascular: Amlodipine Nitroglycerin Aspirin  Gastrointestinal: Promethazine (PRN) Pantoprazole  Renal: Calcitriol Velphro Rena-Vite  Topical: Diclofenac gel    Pain: Acetaminophen  Medication Review Findings:  Patient stated she did not have mirtazapine because it was called into the pharmacy and she will not have money to purchase it before February 07, 2018.   Pharmacy was called and Amlodipine 10mg  and Mirtazapine are ready for pick up with a copay of $1.25 each.  Patient's granddaughter was called to see if she would be able to purchase her grandmother's medications.  HIPAA compliant message was left on her voicemail.  Plan: Route note to the patient's PCP Patient has an appointment to see her PCP 02/10/2018. Follow up with patient within 3 business days.   Elayne Guerin, PharmD, [place] Clinical Pharmacist [phone]

## 2018-02-05 ENCOUNTER — Other Ambulatory Visit: Payer: Self-pay | Admitting: *Deleted

## 2018-02-05 NOTE — Patient Outreach (Signed)
[facility]) Care Management  02/05/2018  Nancy [name] Aug 26, 1940 [id number]   Covering for assigned care manager, J. Juleen China.  Notified of recent hospitalization 7/23-7/28 for chest pain, nausea/vomiting.  Primary MD office completed transition of care assessment.  Noted that [facility] pharmacist also placed post hospital call to review medications.    Call placed to member to follow up on current health status.  She report she is doing well, did have some nausea yesterday prior to dialysis treatment, denies any at this time.  State she is compliant with medications, but also state they "make me sick."  Has history of medication non-compliance.    Report follow up appointment scheduled with primary MD for Tuesday, 8/6, state she will use [facility] for transportation.  Advised to contact them as soon as possible to secure.  State she continue to have home aide on non-dialysis days.  Denies any urgent concerns at this time, advised that assigned care manager will follow up upon return to office.  [name], [place], MSN [place] [phone]

## 2018-02-09 DIAGNOSIS — N2581 Secondary hyperparathyroidism of renal origin: Secondary | ICD-10-CM | POA: Diagnosis not present

## 2018-02-09 DIAGNOSIS — D509 Iron deficiency anemia, unspecified: Secondary | ICD-10-CM | POA: Diagnosis not present

## 2018-02-09 DIAGNOSIS — D689 Coagulation defect, unspecified: Secondary | ICD-10-CM | POA: Diagnosis not present

## 2018-02-09 DIAGNOSIS — R51 Headache: Secondary | ICD-10-CM | POA: Diagnosis not present

## 2018-02-09 DIAGNOSIS — D631 Anemia in chronic kidney disease: Secondary | ICD-10-CM | POA: Diagnosis not present

## 2018-02-09 DIAGNOSIS — N186 End stage renal disease: Secondary | ICD-10-CM | POA: Diagnosis not present

## 2018-02-10 ENCOUNTER — Encounter: Payer: Self-pay | Admitting: Internal Medicine

## 2018-02-10 ENCOUNTER — Other Ambulatory Visit: Payer: Self-pay

## 2018-02-10 ENCOUNTER — Ambulatory Visit (INDEPENDENT_AMBULATORY_CARE_PROVIDER_SITE_OTHER): Payer: Medicare Other | Admitting: Internal Medicine

## 2018-02-10 ENCOUNTER — Other Ambulatory Visit: Payer: Self-pay | Admitting: Pharmacist

## 2018-02-10 DIAGNOSIS — K219 Gastro-esophageal reflux disease without esophagitis: Secondary | ICD-10-CM | POA: Diagnosis not present

## 2018-02-10 DIAGNOSIS — R011 Cardiac murmur, unspecified: Secondary | ICD-10-CM

## 2018-02-10 DIAGNOSIS — N186 End stage renal disease: Secondary | ICD-10-CM | POA: Diagnosis not present

## 2018-02-10 DIAGNOSIS — Z992 Dependence on renal dialysis: Secondary | ICD-10-CM

## 2018-02-10 DIAGNOSIS — I120 Hypertensive chronic kidney disease with stage 5 chronic kidney disease or end stage renal disease: Secondary | ICD-10-CM

## 2018-02-10 DIAGNOSIS — Z79899 Other long term (current) drug therapy: Secondary | ICD-10-CM

## 2018-02-10 DIAGNOSIS — R112 Nausea with vomiting, unspecified: Secondary | ICD-10-CM | POA: Diagnosis not present

## 2018-02-10 DIAGNOSIS — R05 Cough: Secondary | ICD-10-CM

## 2018-02-10 DIAGNOSIS — I700 Atherosclerosis of aorta: Secondary | ICD-10-CM

## 2018-02-10 DIAGNOSIS — Z7982 Long term (current) use of aspirin: Secondary | ICD-10-CM

## 2018-02-10 DIAGNOSIS — M549 Dorsalgia, unspecified: Secondary | ICD-10-CM

## 2018-02-10 DIAGNOSIS — I10 Essential (primary) hypertension: Secondary | ICD-10-CM

## 2018-02-10 NOTE — Assessment & Plan Note (Signed)
-  This problem is chronic and stable -We will continue with aspirin as well as good blood pressure control -No further work-up at this time

## 2018-02-10 NOTE — Assessment & Plan Note (Signed)
BP Readings from Last 3 Encounters:  02/10/18 (!) 133/50  02/01/18 (!) 143/59  01/26/18 (!) 154/75    Lab Results  Component Value Date   NA 140 01/30/2018   K 3.2 (L) 01/30/2018   CREATININE 5.66 (H) 01/30/2018    Assessment: Blood pressure control:  Well-controlled Progress toward BP goal:   At goal Comments: Patient states that she is compliant with amlodipine 10 mg  Plan: Medications:  continue current medications Educational resources provided:   Self management tools provided:   Other plans: None

## 2018-02-10 NOTE — Patient Instructions (Signed)
-  It was a pleasure seeing you today -Please take Robitussin over-the-counter as needed for your cough -Please try muscle rub for your back pain as well as Tylenol as needed -Your blood pressure is doing good.  Keep up the great work -Please call me if you have any questions

## 2018-02-10 NOTE — Patient Outreach (Signed)
[facility]) Care Management  02/10/2018  Nancy [name] Nov 06, 1940 [id number]    77 year old with history of ESRD/HD, heart failure, stroke. Per chart noted-8 admissions in 6 months.   7/23-7/28 observation with chest pain; nausea vomiting; volume overload  01/26/18 observation stay with UTI 7/7-7/9 viral gastroenteritis/hypokalemia/diarrhea/ESRD. 6/20-6/25 observation with ESRD/intractable nausea vomiting 6/11-6/14 observation with refractory nausea/vomiting 5/23-5/24 observation with nausea/vomiting/ESRD 4/25-4/30 uremic encephalopathy 4/19-4/23 diverticulitis/ESRD/intractable nausea and vomiting.   RNCM called to follow up and schedule home visit. No answer. HIPPA compliant message left.  Plan: send outreach letter and follow up within 3-4 business days.  Thea Silversmith, RN, MSN, [place] Coordinator Cell: [phone]

## 2018-02-10 NOTE — Assessment & Plan Note (Signed)
-  This problem is chronic and stable -Patient denies any reflux symptoms currently -We will continue Protonix for now

## 2018-02-10 NOTE — H&P (View-Only) (Signed)
   Subjective:    Patient ID: Nancy [name], female    DOB: 1940/11/01, 77 y.o.   MRN: [id number]  HPI  I have seen and examined this patient.  Patient is here for routine follow-up of her hypertension and chronic nausea.  Patient states that she feels well today. She states that she is compliant with her medications but her granddaughter states that she is only intermittently compliant.  Patient does complain of some intermittent back pain as well as chronic cough.  She denies any other complaints at this time.  Review of Systems  Constitutional: Negative.   HENT: Negative.   Respiratory: Positive for cough. Negative for wheezing and stridor.        Nonproductive cough which has been persisting over the last 2 to 3 months  Cardiovascular: Negative.   Gastrointestinal: Negative.   Musculoskeletal: Negative.   Neurological: Negative.   Psychiatric/Behavioral: Negative.        Objective:   Physical Exam  Constitutional: She is oriented to person, place, and time. She appears well-developed and well-nourished.  HENT:  Head: Normocephalic and atraumatic.  Mouth/Throat: No oropharyngeal exudate.  Neck: Neck supple.  Cardiovascular: Normal rate and regular rhythm.  Murmur heard. Grade 3 out of 6 systolic murmur  Pulmonary/Chest: Effort normal and breath sounds normal. She has no wheezes. She has no rales.  Abdominal: Soft. Bowel sounds are normal. She exhibits no distension. There is no tenderness.  Musculoskeletal: Normal range of motion. She exhibits no edema.  Lymphadenopathy:    She has no cervical adenopathy.  Neurological: She is alert and oriented to person, place, and time.  Psychiatric: She has a normal mood and affect. Her behavior is normal.          Assessment & Plan:  Please see problem based charting for assessment and plan:

## 2018-02-10 NOTE — Assessment & Plan Note (Signed)
-  This problem is chronic and stable -Patient is currently on hemodialysis on Monday/Wednesday/Friday -Continue with hemodialysis per nephrology -No further work-up at this time

## 2018-02-10 NOTE — Assessment & Plan Note (Signed)
-  Patient has had multiple admissions over the last couple of months for recurrent nausea and vomiting -Etiology behind her recurrent nausea vomiting remains uncertain at this time  -Possibly secondary to her recent initiation of hemodialysis but per discussion with nephrology this is less likely -Patient follow-up with GI tomorrow -She has had an extensive work-up for this including imaging as well as a gastric emptying study which did not reveal an underlying etiology -Patient was started on Remeron as an inpatient after discussion with palliative to help with her nausea -We will continue with Phenergan as needed as well -Patient did state that she had one episode where she tried to take Tylenol and ended up throwing it up but has had not no further episodes since then and is tolerating oral intake well -We will continue to monitor closely

## 2018-02-10 NOTE — Progress Notes (Signed)
   Subjective:    Patient ID: Nancy [name], female    DOB: [birth year], 77 y.o.   MRN: [id number]  HPI  I have seen and examined this patient.  Patient is here for routine follow-up of her hypertension and chronic nausea.  Patient states that she feels well today. She states that she is compliant with her medications but her granddaughter states that she is only intermittently compliant.  Patient does complain of some intermittent back pain as well as chronic cough.  She denies any other complaints at this time.  Review of Systems  Constitutional: Negative.   HENT: Negative.   Respiratory: Positive for cough. Negative for wheezing and stridor.        Nonproductive cough which has been persisting over the last 2 to 3 months  Cardiovascular: Negative.   Gastrointestinal: Negative.   Musculoskeletal: Negative.   Neurological: Negative.   Psychiatric/Behavioral: Negative.        Objective:   Physical Exam  Constitutional: She is oriented to person, place, and time. She appears well-developed and well-nourished.  HENT:  Head: Normocephalic and atraumatic.  Mouth/Throat: No oropharyngeal exudate.  Neck: Neck supple.  Cardiovascular: Normal rate and regular rhythm.  Murmur heard. Grade 3 out of 6 systolic murmur  Pulmonary/Chest: Effort normal and breath sounds normal. She has no wheezes. She has no rales.  Abdominal: Soft. Bowel sounds are normal. She exhibits no distension. There is no tenderness.  Musculoskeletal: Normal range of motion. She exhibits no edema.  Lymphadenopathy:    She has no cervical adenopathy.  Neurological: She is alert and oriented to person, place, and time.  Psychiatric: She has a normal mood and affect. Her behavior is normal.          Assessment & Plan:  Please see problem based charting for assessment and plan:

## 2018-02-10 NOTE — Patient Outreach (Signed)
[facility]) Care Management  02/10/2018  Nancy [name] 1941/01/14 [id number]   Patient was called to follow up. HIPAA identifiers were obtained. Patient confirmed she picked up her medications and is taking all of her medications as prescribed.  She reported feeling "good" today. She has a PCP appointment at 10:45 this morning.   Plan: Close patient's case as her pharmacy needs have been met. She is being actively followed by [facility] Nurse, Thea Silversmith.   Elayne Guerin, PharmD, [place] Clinical Pharmacist [phone]

## 2018-02-11 ENCOUNTER — Other Ambulatory Visit: Payer: Self-pay | Admitting: Gastroenterology

## 2018-02-11 DIAGNOSIS — R1310 Dysphagia, unspecified: Secondary | ICD-10-CM | POA: Diagnosis not present

## 2018-02-11 DIAGNOSIS — N2581 Secondary hyperparathyroidism of renal origin: Secondary | ICD-10-CM | POA: Diagnosis not present

## 2018-02-11 DIAGNOSIS — R112 Nausea with vomiting, unspecified: Secondary | ICD-10-CM | POA: Diagnosis not present

## 2018-02-11 DIAGNOSIS — R51 Headache: Secondary | ICD-10-CM | POA: Diagnosis not present

## 2018-02-11 DIAGNOSIS — D509 Iron deficiency anemia, unspecified: Secondary | ICD-10-CM | POA: Diagnosis not present

## 2018-02-11 DIAGNOSIS — D631 Anemia in chronic kidney disease: Secondary | ICD-10-CM | POA: Diagnosis not present

## 2018-02-11 DIAGNOSIS — N186 End stage renal disease: Secondary | ICD-10-CM | POA: Diagnosis not present

## 2018-02-11 DIAGNOSIS — D689 Coagulation defect, unspecified: Secondary | ICD-10-CM | POA: Diagnosis not present

## 2018-02-12 ENCOUNTER — Other Ambulatory Visit: Payer: Self-pay

## 2018-02-12 NOTE — Patient Outreach (Signed)
[facility]) Care Management  02/12/2018  [place] B [place]tek 10/11/1940 [id number]   Subjective: I am doing pretty good now".   Objective: none  Assessment: 77 year old with history of ESRD/HD, heart failure, stroke. Per chart noted-8 admissions in 6 months.   7/23-7/28 observation with chest pain; nausea vomiting; volume overload  01/26/18 observation stay with UTI 7/7-7/9 viral gastroenteritis/hypokalemia/diarrhea/ESRD. 6/20-6/25 observation with ESRD/intractable nausea vomiting 6/11-6/14 observation with refractory nausea/vomiting 5/23-5/24 observation with nausea/vomiting/ESRD 4/25-4/30 uremic encephalopathy 4/19-4/23 diverticulitis/ESRD/intractable nausea and vomiting.   Client reports feeling better. She denies pain. She denies dizziness. Client reports she is scheduled for an endoscopy next Friday.  Plan: home visit scheduled for next week.  Thea Silversmith, RN, MSN, [place] Coordinator Cell: [phone]

## 2018-02-13 DIAGNOSIS — R51 Headache: Secondary | ICD-10-CM | POA: Diagnosis not present

## 2018-02-13 DIAGNOSIS — D509 Iron deficiency anemia, unspecified: Secondary | ICD-10-CM | POA: Diagnosis not present

## 2018-02-13 DIAGNOSIS — D689 Coagulation defect, unspecified: Secondary | ICD-10-CM | POA: Diagnosis not present

## 2018-02-13 DIAGNOSIS — N2581 Secondary hyperparathyroidism of renal origin: Secondary | ICD-10-CM | POA: Diagnosis not present

## 2018-02-13 DIAGNOSIS — D631 Anemia in chronic kidney disease: Secondary | ICD-10-CM | POA: Diagnosis not present

## 2018-02-13 DIAGNOSIS — N186 End stage renal disease: Secondary | ICD-10-CM | POA: Diagnosis not present

## 2018-02-16 DIAGNOSIS — D689 Coagulation defect, unspecified: Secondary | ICD-10-CM | POA: Diagnosis not present

## 2018-02-16 DIAGNOSIS — R51 Headache: Secondary | ICD-10-CM | POA: Diagnosis not present

## 2018-02-16 DIAGNOSIS — N186 End stage renal disease: Secondary | ICD-10-CM | POA: Diagnosis not present

## 2018-02-16 DIAGNOSIS — D631 Anemia in chronic kidney disease: Secondary | ICD-10-CM | POA: Diagnosis not present

## 2018-02-16 DIAGNOSIS — N2581 Secondary hyperparathyroidism of renal origin: Secondary | ICD-10-CM | POA: Diagnosis not present

## 2018-02-16 DIAGNOSIS — D509 Iron deficiency anemia, unspecified: Secondary | ICD-10-CM | POA: Diagnosis not present

## 2018-02-17 DIAGNOSIS — Z992 Dependence on renal dialysis: Secondary | ICD-10-CM | POA: Diagnosis not present

## 2018-02-17 DIAGNOSIS — N186 End stage renal disease: Secondary | ICD-10-CM | POA: Diagnosis not present

## 2018-02-17 DIAGNOSIS — T82858A Stenosis of vascular prosthetic devices, implants and grafts, initial encounter: Secondary | ICD-10-CM | POA: Diagnosis not present

## 2018-02-17 DIAGNOSIS — I871 Compression of vein: Secondary | ICD-10-CM | POA: Diagnosis not present

## 2018-02-18 DIAGNOSIS — D509 Iron deficiency anemia, unspecified: Secondary | ICD-10-CM | POA: Diagnosis not present

## 2018-02-18 DIAGNOSIS — D631 Anemia in chronic kidney disease: Secondary | ICD-10-CM | POA: Diagnosis not present

## 2018-02-18 DIAGNOSIS — R51 Headache: Secondary | ICD-10-CM | POA: Diagnosis not present

## 2018-02-18 DIAGNOSIS — N186 End stage renal disease: Secondary | ICD-10-CM | POA: Diagnosis not present

## 2018-02-18 DIAGNOSIS — D689 Coagulation defect, unspecified: Secondary | ICD-10-CM | POA: Diagnosis not present

## 2018-02-18 DIAGNOSIS — N2581 Secondary hyperparathyroidism of renal origin: Secondary | ICD-10-CM | POA: Diagnosis not present

## 2018-02-19 ENCOUNTER — Other Ambulatory Visit: Payer: Self-pay

## 2018-02-19 NOTE — Patient Outreach (Signed)
Nancy [name]   02/19/2018  Nancy [name] 1940-12-25 [id number]  Nancy [name] is an 77 y.o. female  Subjective: client reports feeling better, she reports she is having procedure to look down her throat tomorrow.  Objective:  BP 130/60   Pulse 95   Resp 20   Ht 1.6 m (_0 )   Wt 129 lb (58.5 kg) Comment: patient self weight today  SpO2 98%   BMI 22.85 kg/m   Review of Systems  Respiratory:       Lungs decreased, clear.  Cardiovascular:       Heart rate regular, puffiness noted to ankles.    Physical Exam skin warm dry, color within normal limits.  Encounter Medications:   Outpatient Encounter Medications as of 02/19/2018  Medication Sig Note  . amLODipine (NORVASC) 10 MG tablet Take 1 tablet (10 mg total) by mouth daily.   . diclofenac sodium (VOLTAREN) 1 % GEL Apply 4 g topically 4 (four) times daily. (Patient taking differently: Apply 4 g topically 4 (four) times daily as needed (pain). )   . mirtazapine (REMERON) 15 MG tablet Take 1 tablet (15 mg total) by mouth at bedtime.   . nitroGLYCERIN (NITROSTAT) 0.3 MG SL tablet Place 1 tablet (0.3 mg total) under the tongue every 5 (five) minutes as needed for chest pain.   . promethazine (PHENERGAN) 12.5 MG tablet Take 1 tablet (12.5 mg total) by mouth every 6 (six) hours as needed for nausea or vomiting (Take 1 prior to dialysis). Take 1 tablet half an hour before meal.   . sucroferric oxyhydroxide (VELPHORO) 500 MG chewable tablet Chew 500 mg by mouth 3 (three) times daily with meals.   Marland Kitchen aspirin EC 81 MG EC tablet Take 1 tablet (81 mg total) by mouth daily. (Patient not taking: Reported on 02/19/2018)   . calcitRIOL (ROCALTROL) 0.5 MCG capsule Take 4 capsules (2 mcg total) by mouth every Monday, Wednesday, and Friday with hemodialysis. (Patient not taking: Reported on 02/19/2018) 01/15/2018: Given at dailysis  . pantoprazole (PROTONIX) 40 MG tablet Take 1 tablet (40 mg total) by mouth daily.  02/19/2018: Reports she has been taking this as needed.   No facility-administered encounter medications on file as of 02/19/2018.     Functional Status:   In your present state of health, do you have any difficulty performing the following activities: 02/19/2018 02/12/2018  Hearing? - N  Vision? - Y  Difficulty concentrating or making decisions? - N  Walking or climbing stairs? - Y  Dressing or bathing? - N  Doing errands, shopping? - Y  Preparing Food and eating ? N -  Using the Toilet? N -  In the past six months, have you accidently leaked urine? N -  Do you have problems with loss of bowel control? N -  Managing your Medications? N -  Managing your Finances? N -  Housekeeping or managing your Housekeeping? N -  Some recent data might be hidden    Fall/Depression Screening:    Fall Risk  02/12/2018 02/10/2018 01/20/2018  Falls in the past year? No Yes Yes  Number falls in past yr: - 1 1  Injury with Fall? - No No  Risk Factor Category  - - -  Risk for fall due to : - History of fall(s);Impaired balance/gait;Impaired mobility;Impaired vision Impaired mobility;Impaired balance/gait;History of fall(s);Impaired vision  Risk for fall due to: Comment - - -  Follow up - Falls prevention discussed Falls prevention discussed  PHQ 2/9 Scores 02/12/2018 02/10/2018 01/20/2018 01/06/2018 12/25/2017 12/24/2017 10/30/2017  PHQ - 2 Score 2 0 0 0 0 0 0    Assessment:  77 year old with history of ESRD/HD, heart failure, stroke. Per chart noted-8 admissions in 6 months.  7/23-7/28 observation with chest pain; nausea vomiting; volume overload  01/26/18 observation stay with UTI 7/7-7/9 viral gastroenteritis/hypokalemia/diarrhea/ESRD. 6/20-6/25observationwithESRD/intractable nausea vomiting 6/11-6/14 observation with refractory nausea/vomiting 5/23-5/24 observation with nausea/vomiting/ESRD 4/25-4/30 uremic encephalopathy 4/19-4/23 diverticulitis/ESRD/intractable nausea and vomiting.   RNCM completed  home visit. Client reports overall feeling better. She states she is not nausea has improved. Denies nausea during home visit. Client reports she is scheduled for an endoscopy on tomorrow. She reports her daughter [name] will be driving her.   At risk to fall. Client reports dizziness improved. Fall prevention strategies reviewed.130/60 this morning.  Medications reviewed. Client is knowledgable about her mediations. She no longer is wearing the patch and continues to take Blood pressure pill as prescribed.  Plan: follow up telephonically next month to assess if any additional care coordination needs. [facility] CM Care Plan Problem One     Most Recent Value  Care Plan Problem One  at risk for fall due to dizziness  Role Documenting the Problem One  Care [name] Kiln for Problem One  Active  [facility] Long Term Goal   client/family will verbalize at least three fall prevention strategies within the next 31-45 days.  [facility]N Long Term Goal Start Date  01/01/18  [facility]N Long Term Goal Met Date  02/19/18  [facility] CM Short Term Goal #1   client/family will verbalize improvement or no dizziness within the next 30 days.  THN CM Short Term Goal #1 Start Date  01/01/18  [facility]N CM Short Term Goal #1 Met Date  01/15/18  [facility]N CM Short Term Goal #2   client/family will verbalize at least fall prevention strategies within the next 30 days.  THN CM Short Term Goal #2 Start Date  01/01/18  [facility] CM Short Term Goal #2 Met Date  02/12/18  THN CM Short Term Goal #3  client will verbalize taking medications as prescribed within the next 30 days.  THN CM Short Term Goal #3 Start Date  02/12/18  THN CM Short Term Goal #3 Met Date  02/19/18  THN CM Short Term Goal #4  client will call RNCM and/or 24 hour nurse advice line as needed within the next 30 days.  THN CM Short Term Goal #4 Start Date  02/19/18  Interventions for Short Term Goal #4  RNCM reinforced availability of 24 hour nurse advice line. RNCM provided contact  number again and encouraged client to call as needed.      Thea Silversmith, RN, MSN, [place] Coordinator Cell: [phone]

## 2018-02-20 ENCOUNTER — Ambulatory Visit (HOSPITAL_COMMUNITY)
Admission: RE | Admit: 2018-02-20 | Discharge: 2018-02-20 | Disposition: A | Discharge status: Home / Self Care | Payer: Medicare Other | Source: Ambulatory Visit | Attending: Gastroenterology | Admitting: Gastroenterology

## 2018-02-20 ENCOUNTER — Ambulatory Visit (HOSPITAL_COMMUNITY): Payer: Medicare Other | Admitting: Registered Nurse

## 2018-02-20 ENCOUNTER — Other Ambulatory Visit: Payer: Self-pay

## 2018-02-20 ENCOUNTER — Encounter (HOSPITAL_COMMUNITY)
Admission: RE | Disposition: A | Discharge status: Home / Self Care | Payer: Self-pay | Source: Ambulatory Visit | Attending: Gastroenterology

## 2018-02-20 ENCOUNTER — Encounter (HOSPITAL_COMMUNITY): Payer: Self-pay | Admitting: Emergency Medicine

## 2018-02-20 DIAGNOSIS — R110 Nausea: Secondary | ICD-10-CM | POA: Diagnosis not present

## 2018-02-20 DIAGNOSIS — R51 Headache: Secondary | ICD-10-CM | POA: Diagnosis not present

## 2018-02-20 DIAGNOSIS — Z7982 Long term (current) use of aspirin: Secondary | ICD-10-CM | POA: Insufficient documentation

## 2018-02-20 DIAGNOSIS — D689 Coagulation defect, unspecified: Secondary | ICD-10-CM | POA: Diagnosis not present

## 2018-02-20 DIAGNOSIS — R1310 Dysphagia, unspecified: Secondary | ICD-10-CM | POA: Diagnosis not present

## 2018-02-20 DIAGNOSIS — I10 Essential (primary) hypertension: Secondary | ICD-10-CM | POA: Diagnosis not present

## 2018-02-20 DIAGNOSIS — K208 Other esophagitis: Secondary | ICD-10-CM | POA: Diagnosis not present

## 2018-02-20 DIAGNOSIS — Z79899 Other long term (current) drug therapy: Secondary | ICD-10-CM | POA: Diagnosis not present

## 2018-02-20 DIAGNOSIS — D649 Anemia, unspecified: Secondary | ICD-10-CM | POA: Diagnosis not present

## 2018-02-20 DIAGNOSIS — K219 Gastro-esophageal reflux disease without esophagitis: Secondary | ICD-10-CM | POA: Diagnosis not present

## 2018-02-20 DIAGNOSIS — N186 End stage renal disease: Secondary | ICD-10-CM | POA: Diagnosis not present

## 2018-02-20 DIAGNOSIS — E785 Hyperlipidemia, unspecified: Secondary | ICD-10-CM | POA: Diagnosis not present

## 2018-02-20 DIAGNOSIS — Q394 Esophageal web: Secondary | ICD-10-CM | POA: Insufficient documentation

## 2018-02-20 DIAGNOSIS — K222 Esophageal obstruction: Secondary | ICD-10-CM | POA: Diagnosis not present

## 2018-02-20 DIAGNOSIS — N2581 Secondary hyperparathyroidism of renal origin: Secondary | ICD-10-CM | POA: Diagnosis not present

## 2018-02-20 DIAGNOSIS — K449 Diaphragmatic hernia without obstruction or gangrene: Secondary | ICD-10-CM | POA: Insufficient documentation

## 2018-02-20 DIAGNOSIS — D631 Anemia in chronic kidney disease: Secondary | ICD-10-CM | POA: Diagnosis not present

## 2018-02-20 DIAGNOSIS — R112 Nausea with vomiting, unspecified: Secondary | ICD-10-CM | POA: Diagnosis not present

## 2018-02-20 DIAGNOSIS — K228 Other specified diseases of esophagus: Secondary | ICD-10-CM | POA: Diagnosis not present

## 2018-02-20 DIAGNOSIS — D509 Iron deficiency anemia, unspecified: Secondary | ICD-10-CM | POA: Diagnosis not present

## 2018-02-20 HISTORY — PX: BIOPSY: SHX5522

## 2018-02-20 HISTORY — PX: ESOPHAGOGASTRODUODENOSCOPY (EGD) WITH PROPOFOL: SHX5813

## 2018-02-20 HISTORY — PX: SAVORY DILATION: SHX5439

## 2018-02-20 LAB — POCT I-STAT 4, (NA,K, GLUC, HGB,HCT)
Glucose, Bld: 78 mg/dL (ref 70–99)
HCT: 30 % — ABNORMAL LOW (ref 36.0–46.0)
Hemoglobin: 10.2 g/dL — ABNORMAL LOW (ref 12.0–15.0)
POTASSIUM: 4.5 mmol/L (ref 3.5–5.1)
SODIUM: 135 mmol/L (ref 135–145)

## 2018-02-20 SURGERY — ESOPHAGOGASTRODUODENOSCOPY (EGD) WITH PROPOFOL
Anesthesia: Monitor Anesthesia Care

## 2018-02-20 MED ORDER — PROPOFOL 500 MG/50ML IV EMUL
INTRAVENOUS | Status: DC | PRN
  Administered 2018-02-20: 130 ug/kg/min via INTRAVENOUS

## 2018-02-20 MED ORDER — PROPOFOL 10 MG/ML IV BOLUS
INTRAVENOUS | Status: DC | PRN
  Administered 2018-02-20 (×2): 20 mg via INTRAVENOUS

## 2018-02-20 MED ORDER — PROPOFOL 10 MG/ML IV BOLUS
INTRAVENOUS | Status: AC
  Filled 2018-02-20: qty 40

## 2018-02-20 MED ORDER — SODIUM CHLORIDE 0.9 % IV SOLN
INTRAVENOUS | Status: DC | PRN
  Administered 2018-02-20: 09:00:00 via INTRAVENOUS

## 2018-02-20 SURGICAL SUPPLY — 15 items

## 2018-02-20 NOTE — Anesthesia Postprocedure Evaluation (Signed)
Anesthesia Post Note  Patient: Nancy [name]  Procedure(s) Performed: ESOPHAGOGASTRODUODENOSCOPY (EGD) WITH PROPOFOL (N/A ) SAVORY DILATION (N/A ) BIOPSY     Patient location during evaluation: PACU Anesthesia Type: MAC Level of consciousness: awake Pain management: pain level controlled Vital Signs Assessment: post-procedure vital signs reviewed and stable Respiratory status: spontaneous breathing Cardiovascular status: stable Anesthetic complications: no    Last Vitals:  Vitals:   02/20/18 0930 02/20/18 0940  BP: (!) 171/83   Pulse: (!) 101 100  Resp: (!) 21 (!) 22  Temp:    SpO2: 100% 99%    Last Pain:  Vitals:   02/20/18 0930  TempSrc:   PainSc: 0-No pain                 Rashell Shambaugh

## 2018-02-20 NOTE — Anesthesia Preprocedure Evaluation (Addendum)
Anesthesia Evaluation  Patient identified by MRN, date of birth, ID band Patient awake    Reviewed: Allergy & Precautions, NPO status , Patient's Chart, lab work & pertinent test results  Airway Mallampati: II  TM Distance: >3 FB     Dental   Pulmonary    breath sounds clear to auscultation       Cardiovascular hypertension, + angina +CHF  + Valvular Problems/Murmurs  Rhythm:Regular Rate:Normal     Neuro/Psych    GI/Hepatic Neg liver ROS, GERD  ,  Endo/Other    Renal/GU Renal disease     Musculoskeletal  (+) Arthritis ,   Abdominal   Peds  Hematology  (+) anemia ,   Anesthesia Other Findings   Reproductive/Obstetrics                             Anesthesia Physical Anesthesia Plan  ASA: III  Anesthesia Plan: MAC   Post-op Pain Management:    Induction: Intravenous  PONV Risk Score and Plan: Treatment may vary due to age or medical condition  Airway Management Planned: Nasal Cannula and Simple Face Mask  Additional Equipment:   Intra-op Plan:   Post-operative Plan:   Informed Consent: I have reviewed the patients History and Physical, chart, labs and discussed the procedure including the risks, benefits and alternatives for the proposed anesthesia with the patient or authorized representative who has indicated his/her understanding and acceptance.   Dental advisory given  Plan Discussed with: CRNA and Anesthesiologist  Anesthesia Plan Comments:         Anesthesia Quick Evaluation

## 2018-02-20 NOTE — Op Note (Signed)
[facility] Patient Name: Nancy [name] Procedure Date: 02/20/2018 MRN: [id number] Attending MD: Carol Ada , MD Date of Birth: 1941/01/06 CSN: [id number] Age: 77 Admit Type: Outpatient Procedure:                Upper GI endoscopy Indications:              Dysphagia Providers:                Carol Ada, MD, Burtis Junes, RN, William Dalton,                            Technician Referring MD:              Medicines:                Propofol per Anesthesia Complications:            No immediate complications. Estimated Blood Loss:     Estimated blood loss was minimal. Procedure:                Pre-Anesthesia Assessment:                           - Prior to the procedure, a History and Physical                            was performed, and patient medications and                            allergies were reviewed. The patient's tolerance of                            previous anesthesia was also reviewed. The risks                            and benefits of the procedure and the sedation                            options and risks were discussed with the patient.                            All questions were answered, and informed consent                            was obtained. Prior Anticoagulants: The patient has                            taken no previous anticoagulant or antiplatelet                            agents. ASA Grade Assessment: III - A patient with                            severe systemic disease. After reviewing the risks                            and  benefits, the patient was deemed in                            satisfactory condition to undergo the procedure.                           - Sedation was administered by an anesthesia                            professional. Deep sedation was attained.                           After obtaining informed consent, the endoscope was                            passed under direct vision. Throughout the                           procedure, the patient's blood pressure, pulse, and                            oxygen saturations were monitored continuously. The                            GIF-H190 ([id number] Olympus adult endoscope was                            introduced through the mouth, and advanced to the                            second part of duodenum. The upper GI endoscopy was                            accomplished without difficulty. The patient                            tolerated the procedure well. 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 A guidewire was       placed and the scope was withdrawn. Dilation was performed with a Savary       dilator with no resistance at 18 mm. The dilation site was examined       following endoscope reinsertion and showed complete resolution of       luminal narrowing.      A 2 cm hiatal hernia was present. Biopsies were taken with a cold       forceps for histology.      The stomach was normal.      The examined duodenum was normal.      In the distal esophagus there was evidence of some abnormal mucosa. Cold       biopsies were obtained. Impression:               - Web in the upper third of the esophagus. Dilated.                           -  2 cm hiatal hernia. Biopsied.                           - Normal stomach.                           - Normal examined duodenum. Moderate Sedation:      N/A- Per Anesthesia Care Recommendation:           - Patient has a contact number available for                            emergencies. The signs and symptoms of potential                            delayed complications were discussed with the                            patient. Return to normal activities tomorrow.                            Written discharge instructions were provided to the                            patient.                           - Resume previous diet.                           -  Continue present medications.                           - Await pathology results.                           - Return to GI clinic in 4 weeks. Procedure Code(s):        --- Professional ---                           [phone], Esophagogastroduodenoscopy, flexible,                            transoral; with insertion of guide wire followed by                            passage of dilator(s) through esophagus over guide                            wire                           43239, Esophagogastroduodenoscopy, flexible,                            transoral; with biopsy, single or multiple Diagnosis Code(s):        --- Professional ---  Q39.4, Esophageal web                           K44.9, Diaphragmatic hernia without obstruction or                            gangrene                           R13.10, Dysphagia, unspecified CPT copyright 2017 American Medical Association. All rights reserved. The codes documented in this report are preliminary and upon coder review may  be revised to meet current compliance requirements. Carol Ada, MD Carol Ada, MD 02/20/2018 9:30:24 AM This report has been signed electronically. Number of Addenda: 0

## 2018-02-20 NOTE — Transfer of Care (Signed)
Immediate Anesthesia Transfer of Care Note  Patient: Nancy [name]  Procedure(s) Performed: ESOPHAGOGASTRODUODENOSCOPY (EGD) WITH PROPOFOL (N/A ) SAVORY DILATION (N/A ) BIOPSY  Patient Location: PACU  Anesthesia Type:MAC  Level of Consciousness: awake, alert , oriented and patient cooperative  Airway & Oxygen Therapy: Patient Spontanous Breathing and Patient connected to nasal cannula oxygen  Post-op Assessment: Report given to RN, Post -op Vital signs reviewed and stable and Patient moving all extremities  Post vital signs: Reviewed and stable  Last Vitals:  Vitals Value Taken Time  BP    Temp    Pulse    Resp    SpO2      Last Pain:  Vitals:   02/20/18 0720  TempSrc: Oral  PainSc: 0-No pain         Complications: No apparent anesthesia complications

## 2018-02-20 NOTE — Discharge Instructions (Signed)
Esophagogastroduodenoscopy, Care After °Refer to this sheet in the next few weeks. These instructions provide you with information about caring for yourself after your procedure. Your health care provider may also give you more specific instructions. Your treatment has been planned according to current medical practices, but problems sometimes occur. Call your health care provider if you have any problems or questions after your procedure. °What can I expect after the procedure? °After the procedure, it is common to have: °· A sore throat. °· Nausea. °· Bloating. °· Dizziness. °· Fatigue. ° °Follow these instructions at home: °· Do not eat or drink anything until the numbing medicine (local anesthetic) has worn off and your gag reflex has returned. You will know that the local anesthetic has worn off when you can swallow comfortably. °· Do not drive for 24 hours if you received a medicine to help you relax (sedative). °· If your health care provider took a tissue sample for testing during the procedure, make sure to get your test results. This is your responsibility. Ask your health care provider or the department performing the test when your results will be ready. °· Keep all follow-up visits as told by your health care provider. This is important. °Contact a health care provider if: °· You cannot stop coughing. °· You are not urinating. °· You are urinating less than usual. °Get help right away if: °· You have trouble swallowing. °· You cannot eat or drink. °· You have throat or chest pain that gets worse. °· You are dizzy or light-headed. °· You faint. °· You have nausea or vomiting. °· You have chills. °· You have a fever. °· You have severe abdominal pain. °· You have black, tarry, or bloody stools. °This information is not intended to replace advice given to you by your health care provider. Make sure you discuss any questions you have with your health care provider. °Document Released: 06/10/2012 Document  Revised: 11/30/2015 Document Reviewed: 05/18/2015 °Elsevier Interactive Patient Education © 2018 Elsevier Inc. ° °

## 2018-02-20 NOTE — Interval H&P Note (Signed)
History and Physical Interval Note:  The patient is here to be worked up further for her nausea and vomiting during a recent hospital admission.  02/20/2018 7:36 AM  Nancy [name]  has presented today for surgery, with the diagnosis of dysphagia  The various methods of treatment have been discussed with the patient and family. After consideration of risks, benefits and other options for treatment, the patient has consented to  Procedure(s): ESOPHAGOGASTRODUODENOSCOPY (EGD) WITH PROPOFOL (N/A) SAVORY DILATION (N/A) as a surgical intervention .  The patient's history has been reviewed, patient examined, no change in status, stable for surgery.  I have reviewed the patient's chart and labs.  Questions were answered to the patient's satisfaction.     [name]

## 2018-02-23 ENCOUNTER — Encounter (HOSPITAL_COMMUNITY): Payer: Self-pay | Admitting: Gastroenterology

## 2018-02-23 DIAGNOSIS — N186 End stage renal disease: Secondary | ICD-10-CM | POA: Diagnosis not present

## 2018-02-23 DIAGNOSIS — D509 Iron deficiency anemia, unspecified: Secondary | ICD-10-CM | POA: Diagnosis not present

## 2018-02-23 DIAGNOSIS — N2581 Secondary hyperparathyroidism of renal origin: Secondary | ICD-10-CM | POA: Diagnosis not present

## 2018-02-23 DIAGNOSIS — R51 Headache: Secondary | ICD-10-CM | POA: Diagnosis not present

## 2018-02-23 DIAGNOSIS — D689 Coagulation defect, unspecified: Secondary | ICD-10-CM | POA: Diagnosis not present

## 2018-02-23 DIAGNOSIS — D631 Anemia in chronic kidney disease: Secondary | ICD-10-CM | POA: Diagnosis not present

## 2018-02-25 DIAGNOSIS — R51 Headache: Secondary | ICD-10-CM | POA: Diagnosis not present

## 2018-02-25 DIAGNOSIS — D509 Iron deficiency anemia, unspecified: Secondary | ICD-10-CM | POA: Diagnosis not present

## 2018-02-25 DIAGNOSIS — N2581 Secondary hyperparathyroidism of renal origin: Secondary | ICD-10-CM | POA: Diagnosis not present

## 2018-02-25 DIAGNOSIS — D631 Anemia in chronic kidney disease: Secondary | ICD-10-CM | POA: Diagnosis not present

## 2018-02-25 DIAGNOSIS — N186 End stage renal disease: Secondary | ICD-10-CM | POA: Diagnosis not present

## 2018-02-25 DIAGNOSIS — D689 Coagulation defect, unspecified: Secondary | ICD-10-CM | POA: Diagnosis not present

## 2018-02-27 DIAGNOSIS — D509 Iron deficiency anemia, unspecified: Secondary | ICD-10-CM | POA: Diagnosis not present

## 2018-02-27 DIAGNOSIS — D689 Coagulation defect, unspecified: Secondary | ICD-10-CM | POA: Diagnosis not present

## 2018-02-27 DIAGNOSIS — D631 Anemia in chronic kidney disease: Secondary | ICD-10-CM | POA: Diagnosis not present

## 2018-02-27 DIAGNOSIS — N186 End stage renal disease: Secondary | ICD-10-CM | POA: Diagnosis not present

## 2018-02-27 DIAGNOSIS — N2581 Secondary hyperparathyroidism of renal origin: Secondary | ICD-10-CM | POA: Diagnosis not present

## 2018-02-27 DIAGNOSIS — R51 Headache: Secondary | ICD-10-CM | POA: Diagnosis not present

## 2018-03-02 DIAGNOSIS — R51 Headache: Secondary | ICD-10-CM | POA: Diagnosis not present

## 2018-03-02 DIAGNOSIS — N186 End stage renal disease: Secondary | ICD-10-CM | POA: Diagnosis not present

## 2018-03-02 DIAGNOSIS — D631 Anemia in chronic kidney disease: Secondary | ICD-10-CM | POA: Diagnosis not present

## 2018-03-02 DIAGNOSIS — D509 Iron deficiency anemia, unspecified: Secondary | ICD-10-CM | POA: Diagnosis not present

## 2018-03-02 DIAGNOSIS — N2581 Secondary hyperparathyroidism of renal origin: Secondary | ICD-10-CM | POA: Diagnosis not present

## 2018-03-02 DIAGNOSIS — D689 Coagulation defect, unspecified: Secondary | ICD-10-CM | POA: Diagnosis not present

## 2018-03-03 ENCOUNTER — Telehealth: Payer: Self-pay | Admitting: Internal Medicine

## 2018-03-03 NOTE — Telephone Encounter (Signed)
   Reason for call:   I received a call from Ms. [place] B Ashenfelter at [place] hrs PM indicating request for a return call.   Pertinent Data:  Received Page from patients granddaughter to call the patient. Upon returning the call the patient described symptoms of orthostatic hypotension with mild lightheadedness upon rising from a seated position as well as similar symptoms when sitting upright from a prone position. She denied syncope but stated that on one occasion these symptoms did cause her to feel "dizzy".  Due to connection issues the patient was difficult to understand at times necessitating repetitive questioning regarding each concept.   It appears that the symptoms began following her discharge on 07/28 of this year. As per chart review there were two notable medication changes, her Amlodipine was increased from 5 to 10mg  daily and she was prescribed Remeron for sleep. She denied taking the Remeron. She did however, attest to being compliant with the majority of her other medications since discharge. She denied LOC, falling, weight gain, syncope/presyncope, headache, chest pain, palpitations, or tremor.   In addition, she stated that her pharmacy called and was informed that her insurance had not covered her medications. She has been out of all of her medications since 03/02/2018. She was advised by the pharmacist to contact social services and her insurance provider to rectify the issue. I agreed with the advise and in addition recommended that she visit our clinic on 03/04/2018 to seek further guidance and assistance with this matter.    Assessment / Plan / Recommendations:   Given the patient's orthostatic symptoms w/o red flags, I feel that she would benefit from a clinic visit on 03/04/2018 while holding the Amlodipine until that time as I feel this may be the cause. She is unable to visit the clinic until 03/05/2018 due to her HD session on the 28th. In addition, she is unable to identify  the Amlodipine tablet to remove this from her medication dispenser due to visual impairment and unfamiliarity with the medication. I requested that she permit me to contact her granddaughter to further discuss this matter to which she readily agreed. Upon contacting [name] at the number provided in the chart, I was pleased to find her agreeable to visit her grandmothers house this evening in order to remove the Amlodipine from the dispenser and to assist her with establishing an appointment for Thursday, August 29th.   As always, pt is advised that if symptoms worsen or new symptoms arise, they should go to an urgent care facility or to to ER for further evaluation.   Kathi Ludwig, MD   03/03/2018, 7:37 PM

## 2018-03-04 NOTE — Telephone Encounter (Signed)
Placed call to granddaughter, [name]. Appt sched in [facility] 03/05/2018 at 1345. Hubbard Hartshorn, RN, BSN

## 2018-03-04 NOTE — Telephone Encounter (Signed)
Thank you :)

## 2018-03-05 ENCOUNTER — Ambulatory Visit (INDEPENDENT_AMBULATORY_CARE_PROVIDER_SITE_OTHER): Payer: Medicare Other | Admitting: Internal Medicine

## 2018-03-05 ENCOUNTER — Other Ambulatory Visit: Payer: Self-pay

## 2018-03-05 VITALS — BP 129/44 | HR 91 | Temp 97.7°F | Ht 63.0 in | Wt 127.8 lb

## 2018-03-05 DIAGNOSIS — I10 Essential (primary) hypertension: Secondary | ICD-10-CM

## 2018-03-05 DIAGNOSIS — R918 Other nonspecific abnormal finding of lung field: Secondary | ICD-10-CM

## 2018-03-05 DIAGNOSIS — R197 Diarrhea, unspecified: Secondary | ICD-10-CM | POA: Diagnosis not present

## 2018-03-05 DIAGNOSIS — R42 Dizziness and giddiness: Secondary | ICD-10-CM | POA: Diagnosis not present

## 2018-03-05 DIAGNOSIS — R531 Weakness: Secondary | ICD-10-CM

## 2018-03-05 DIAGNOSIS — Z6822 Body mass index (BMI) 22.0-22.9, adult: Secondary | ICD-10-CM

## 2018-03-05 DIAGNOSIS — I050 Rheumatic mitral stenosis: Secondary | ICD-10-CM

## 2018-03-05 DIAGNOSIS — N2581 Secondary hyperparathyroidism of renal origin: Secondary | ICD-10-CM

## 2018-03-05 DIAGNOSIS — R634 Abnormal weight loss: Secondary | ICD-10-CM

## 2018-03-05 DIAGNOSIS — L42 Pityriasis rosea: Secondary | ICD-10-CM

## 2018-03-05 MED ORDER — HYDROCORTISONE 0.5 % EX CREA: 1.0000 "application " | TOPICAL_CREAM | Freq: Two times a day (BID) | CUTANEOUS | 0 refills | Status: DC

## 2018-03-05 NOTE — Progress Notes (Addendum)
   CC: Dizziness  HPI:  [name] is a 77 y.o. with essential hypertension, mitral valve annular calcification, pulmonary nodules, secondary hyperparathyroidism who presents to be evaluated for dizziness.  Presented to the clinic accompanied with her granddaughter who is helping in providing the patient's history and presentation of illness.  Please see problem based charting for evaluation, assessment, and plan.  Past Medical History:  Diagnosis Date  . Anemia   . Aortic stenosis   . Bacterial sinusitis 09/17/2011  . CHF (congestive heart failure) ([place])   . CKD (chronic kidney disease) stage 4, GFR 15-29 ml/min ([place]) 08/11/2006   Cr continues to increase. Proteinuria on UA 02/10/12.    . Colitis   . CVA (cerebrovascular accident) [facility])    New hemorrhagic per CT scan '09  . Diverticulosis of colon   . Dysfunctional uterine bleeding   . ESRD (end stage renal disease) on dialysis ([place])    "MWF; [facility]" (11/27/2017)  . Fecal impaction ([place])   . Headache(784.0)   . Heart murmur   . HERNIORRHAPHY, HX OF 08/11/2006  . Hypertension   . OA (osteoarthritis)    bilateral knees  . Postmenopausal   . Pulmonary nodule   . TINEA CRURIS 01/12/2007   Review of Systems:    Review of Systems  Constitutional: Positive for weight loss. Negative for chills and fever.  Eyes: Negative for blurred vision.  Cardiovascular: Negative for chest pain.  Gastrointestinal: Positive for diarrhea (for past 2 days). Negative for abdominal pain, heartburn, nausea and vomiting.  Skin: Positive for rash (on back).  Neurological: Positive for weakness (lower extremity).    Physical Exam:  Vitals:   03/05/18 1405  BP: (!) 129/44  Pulse: 91  Temp: 97.7 F (36.5 C)  TempSrc: Oral  SpO2: 100%  Weight: 127 lb 12.8 oz (58 kg)  Height: 5\' 3"  (1.6 m)   Physical Exam  Constitutional: She appears well-developed and well-nourished. No distress.  HENT:  Head: Normocephalic and atraumatic.  Eyes:  Conjunctivae are normal.  Cardiovascular: Normal rate, regular rhythm and normal heart sounds.  Respiratory: Effort normal and breath sounds normal. No respiratory distress. She has no wheezes.  GI: Soft. Bowel sounds are normal. She exhibits no distension. There is no tenderness.  Musculoskeletal: She exhibits no edema.  Neurological: She is alert.  Skin: Rash (central raised 1-2cm in sized hyperpigmented papule that is soft to palpation. There are several other 1cm in size linear plaques arranged in a fir tree distribution surrounding the central papule) noted. She is not diaphoretic. No erythema.  Psychiatric: She has a normal mood and affect. Her behavior is normal. Judgment and thought content normal.       Assessment & Plan:   See Encounters Tab for problem based charting.  Patient discussed with Dr. Dareen Piano

## 2018-03-05 NOTE — Patient Instructions (Addendum)
It was a pleasure to see you today Nancy [name]. I am sorry to hear about your dizziness.  -Please continue to stop taking your amlodipine. We have taken some blood to further evaluate you.   -You have a rash called pityriasis rosea- I have prescribed you a cream which should help with that  If you have any questions or concerns, please call our clinic at [phone] between 9am-5pm and after hours call [phone] and ask for the internal medicine resident on call. If you feel you are having a medical emergency please call 911.   Thank you, we look forward to help you remain healthy!  Lars Mage, MD Internal Medicine PGY2

## 2018-03-06 ENCOUNTER — Inpatient Hospital Stay (HOSPITAL_COMMUNITY)
Admission: AD | Admit: 2018-03-06 | Discharge: 2018-03-07 | DRG: 377 | Disposition: A | Discharge status: Home / Self Care | Payer: Medicare Other | Source: Ambulatory Visit | Attending: Internal Medicine | Admitting: Internal Medicine

## 2018-03-06 ENCOUNTER — Encounter: Payer: Self-pay | Admitting: Internal Medicine

## 2018-03-06 ENCOUNTER — Telehealth: Payer: Self-pay | Admitting: Internal Medicine

## 2018-03-06 DIAGNOSIS — L280 Lichen simplex chronicus: Secondary | ICD-10-CM | POA: Insufficient documentation

## 2018-03-06 DIAGNOSIS — Z7982 Long term (current) use of aspirin: Secondary | ICD-10-CM | POA: Diagnosis not present

## 2018-03-06 DIAGNOSIS — K219 Gastro-esophageal reflux disease without esophagitis: Secondary | ICD-10-CM | POA: Diagnosis present

## 2018-03-06 DIAGNOSIS — K921 Melena: Secondary | ICD-10-CM | POA: Diagnosis not present

## 2018-03-06 DIAGNOSIS — D62 Acute posthemorrhagic anemia: Secondary | ICD-10-CM | POA: Diagnosis not present

## 2018-03-06 DIAGNOSIS — I350 Nonrheumatic aortic (valve) stenosis: Secondary | ICD-10-CM | POA: Diagnosis present

## 2018-03-06 DIAGNOSIS — D649 Anemia, unspecified: Secondary | ICD-10-CM

## 2018-03-06 DIAGNOSIS — I120 Hypertensive chronic kidney disease with stage 5 chronic kidney disease or end stage renal disease: Secondary | ICD-10-CM | POA: Diagnosis not present

## 2018-03-06 DIAGNOSIS — D509 Iron deficiency anemia, unspecified: Secondary | ICD-10-CM | POA: Diagnosis not present

## 2018-03-06 DIAGNOSIS — K2981 Duodenitis with bleeding: Secondary | ICD-10-CM | POA: Diagnosis not present

## 2018-03-06 DIAGNOSIS — K449 Diaphragmatic hernia without obstruction or gangrene: Secondary | ICD-10-CM | POA: Diagnosis present

## 2018-03-06 DIAGNOSIS — Z992 Dependence on renal dialysis: Secondary | ICD-10-CM

## 2018-03-06 DIAGNOSIS — Z885 Allergy status to narcotic agent status: Secondary | ICD-10-CM

## 2018-03-06 DIAGNOSIS — N186 End stage renal disease: Secondary | ICD-10-CM | POA: Diagnosis not present

## 2018-03-06 DIAGNOSIS — E785 Hyperlipidemia, unspecified: Secondary | ICD-10-CM | POA: Diagnosis present

## 2018-03-06 DIAGNOSIS — L42 Pityriasis rosea: Secondary | ICD-10-CM | POA: Diagnosis not present

## 2018-03-06 DIAGNOSIS — N2581 Secondary hyperparathyroidism of renal origin: Secondary | ICD-10-CM | POA: Diagnosis not present

## 2018-03-06 DIAGNOSIS — Z8673 Personal history of transient ischemic attack (TIA), and cerebral infarction without residual deficits: Secondary | ICD-10-CM | POA: Diagnosis not present

## 2018-03-06 DIAGNOSIS — K2900 Acute gastritis without bleeding: Secondary | ICD-10-CM

## 2018-03-06 DIAGNOSIS — Z79899 Other long term (current) drug therapy: Secondary | ICD-10-CM

## 2018-03-06 LAB — CBC
HEMOGLOBIN: 6.5 g/dL — AB (ref 11.1–15.9)
Hematocrit: 19.7 % — ABNORMAL LOW (ref 34.0–46.6)
MCH: 31 pg (ref 26.6–33.0)
MCHC: 33 g/dL (ref 31.5–35.7)
MCV: 94 fL (ref 79–97)
Platelets: 196 10*3/uL (ref 150–450)
RBC: 2.1 x10E6/uL — CL (ref 3.77–5.28)
RDW: 16.4 % — ABNORMAL HIGH (ref 12.3–15.4)
WBC: 3.7 10*3/uL (ref 3.4–10.8)

## 2018-03-06 LAB — COMPREHENSIVE METABOLIC PANEL
ALK PHOS: 34 U/L — AB (ref 38–126)
ALT: 8 U/L (ref 0–44)
ANION GAP: 16 — AB (ref 5–15)
AST: 12 U/L — ABNORMAL LOW (ref 15–41)
Albumin: 3 g/dL — ABNORMAL LOW (ref 3.5–5.0)
BUN: 68 mg/dL — ABNORMAL HIGH (ref 8–23)
CALCIUM: 8.7 mg/dL — AB (ref 8.9–10.3)
CO2: 25 mmol/L (ref 22–32)
CREATININE: 10.21 mg/dL — AB (ref 0.44–1.00)
Chloride: 99 mmol/L (ref 98–111)
GFR, EST AFRICAN AMERICAN: 4 mL/min — AB (ref >60)
GFR, EST NON AFRICAN AMERICAN: 3 mL/min — AB (ref >60)
Glucose, Bld: 93 mg/dL (ref 70–99)
Potassium: 4 mmol/L (ref 3.5–5.1)
SODIUM: 140 mmol/L (ref 135–145)
TOTAL PROTEIN: 5.6 g/dL — AB (ref 6.5–8.1)
Total Bilirubin: 0.7 mg/dL (ref 0.3–1.2)

## 2018-03-06 LAB — CBC WITH DIFFERENTIAL/PLATELET
ABS IMMATURE GRANULOCYTES: 0 10*3/uL (ref 0.0–0.1)
BASOS ABS: 0 10*3/uL (ref 0.0–0.1)
Basophils Relative: 1 %
Eosinophils Absolute: 0.1 10*3/uL (ref 0.0–0.7)
Eosinophils Relative: 2 %
HEMATOCRIT: 19.3 % — AB (ref 36.0–46.0)
HEMOGLOBIN: 6 g/dL — AB (ref 12.0–15.0)
Immature Granulocytes: 1 %
LYMPHS PCT: 23 %
Lymphs Abs: 0.9 10*3/uL (ref 0.7–4.0)
MCH: 30.2 pg (ref 26.0–34.0)
MCHC: 31.1 g/dL (ref 30.0–36.0)
MCV: 97 fL (ref 78.0–100.0)
MONO ABS: 0.5 10*3/uL (ref 0.1–1.0)
MONOS PCT: 12 %
Neutro Abs: 2.4 10*3/uL (ref 1.7–7.7)
Neutrophils Relative %: 61 %
Platelets: 166 10*3/uL (ref 150–400)
RBC: 1.99 MIL/uL — ABNORMAL LOW (ref 3.87–5.11)
RDW: 15.9 % — ABNORMAL HIGH (ref 11.5–15.5)
WBC: 3.9 10*3/uL — AB (ref 4.0–10.5)

## 2018-03-06 LAB — PROTIME-INR
INR: 1.03
PROTHROMBIN TIME: 13.4 s (ref 11.4–15.2)

## 2018-03-06 LAB — HEMOGLOBIN AND HEMATOCRIT, BLOOD
HEMATOCRIT: 29.2 % — AB (ref 36.0–46.0)
Hemoglobin: 9.8 g/dL — ABNORMAL LOW (ref 12.0–15.0)

## 2018-03-06 LAB — PREPARE RBC (CROSSMATCH)

## 2018-03-06 LAB — MRSA PCR SCREENING: MRSA by PCR: NEGATIVE

## 2018-03-06 LAB — OCCULT BLOOD X 1 CARD TO LAB, STOOL: Fecal Occult Bld: POSITIVE — AB

## 2018-03-06 MED ORDER — SODIUM CHLORIDE 0.9 % IV SOLN: 100.0000 mL | INTRAVENOUS | Status: DC | PRN

## 2018-03-06 MED ORDER — CALCITRIOL 0.5 MCG PO CAPS: 2.0000 ug | ORAL_CAPSULE | ORAL | Status: DC

## 2018-03-06 MED ORDER — ACETAMINOPHEN 650 MG RE SUPP: 650.0000 mg | Freq: Four times a day (QID) | RECTAL | Status: DC | PRN

## 2018-03-06 MED ORDER — HYDROCORTISONE 0.5 % EX CREA
1.0000 "application " | TOPICAL_CREAM | Freq: Two times a day (BID) | CUTANEOUS | Status: DC
  Filled 2018-03-06: qty 28.35

## 2018-03-06 MED ORDER — HYDROCORTISONE 1 % EX CREA
TOPICAL_CREAM | Freq: Two times a day (BID) | CUTANEOUS | Status: DC
  Administered 2018-03-06 – 2018-03-07 (×2): via TOPICAL
  Filled 2018-03-06: qty 28

## 2018-03-06 MED ORDER — PANTOPRAZOLE SODIUM 40 MG IV SOLR
40.0000 mg | Freq: Two times a day (BID) | INTRAVENOUS | Status: DC
  Administered 2018-03-07: 40 mg via INTRAVENOUS
  Filled 2018-03-06 (×4): qty 40

## 2018-03-06 MED ORDER — SODIUM CHLORIDE 0.9% IV SOLUTION: Freq: Once | INTRAVENOUS | Status: DC

## 2018-03-06 MED ORDER — SUCROFERRIC OXYHYDROXIDE 500 MG PO CHEW
500.0000 mg | CHEWABLE_TABLET | Freq: Three times a day (TID) | ORAL | Status: DC
  Filled 2018-03-06 (×4): qty 1

## 2018-03-06 MED ORDER — LIDOCAINE-PRILOCAINE 2.5-2.5 % EX CREA: 1.0000 "application " | TOPICAL_CREAM | CUTANEOUS | Status: DC | PRN

## 2018-03-06 MED ORDER — ACETAMINOPHEN 325 MG PO TABS
650.0000 mg | ORAL_TABLET | Freq: Four times a day (QID) | ORAL | Status: DC | PRN
  Filled 2018-03-06: qty 2

## 2018-03-06 MED ORDER — DICLOFENAC SODIUM 1 % TD GEL
4.0000 g | Freq: Four times a day (QID) | TRANSDERMAL | Status: DC
  Administered 2018-03-06 – 2018-03-07 (×3): 4 g via TOPICAL
  Filled 2018-03-06: qty 100

## 2018-03-06 MED ORDER — MIRTAZAPINE 15 MG PO TABS
15.0000 mg | ORAL_TABLET | Freq: Every day | ORAL | Status: DC
  Administered 2018-03-06: 15 mg via ORAL
  Filled 2018-03-06: qty 1

## 2018-03-06 MED ORDER — PENTAFLUOROPROP-TETRAFLUOROETH EX AERO: 1.0000 "application " | INHALATION_SPRAY | CUTANEOUS | Status: DC | PRN

## 2018-03-06 MED ORDER — PROMETHAZINE HCL 25 MG PO TABS: 12.5000 mg | ORAL_TABLET | Freq: Four times a day (QID) | ORAL | Status: DC | PRN

## 2018-03-06 MED ORDER — CHLORHEXIDINE GLUCONATE CLOTH 2 % EX PADS: 6.0000 | MEDICATED_PAD | Freq: Every day | CUTANEOUS | Status: DC

## 2018-03-06 NOTE — Assessment & Plan Note (Signed)
The patient states that she has been very itchy in her back for the past few months.  She feels " a raised bump" her back that she would like for the doctor to cut out.  Assessment and plan Examined the patient's skin and noticed that she has a 1 cm hyperpigmented lesion that is raised and soft in nature in the center of her lower back.  There are several other hyperpigmented plaques that are 1 cm in size linearly arranged in a Christmas tree formation from the central rash.  There is some wax also on the anterior trunk.  There are no other rashes located in the upper or lower extremities or on palms or soles.  The rash appears to be consistent with pityriasis rosacea which should self resolve.  However, since the patient has pruritus prescribed hydrocortisone cream.

## 2018-03-06 NOTE — H&P (View-Only) (Signed)
Reason for Consult: Anemia and Melena Referring Physician: Teaching Service  Nancy [name] HPI: This is a 77 year old female who is well-known to me with multiple medical problems admitted for melena and anemia.  Just prior to her dialysis today she was noted to have an anemia of 6.5 g/dL and she was instructed to present to [facility].  Starting Tuesday and reported problems with melena.  She denied any abdominal pain, nausea, vomiting, hematemesis, hematochezia, chest pain, or SOB.  He underwent an EGD on 02/20/2018 with findings of a cervical esophageal web that was dilated with an 18 mm Savary dilator.  Follow up in the office just last week showed that she responded very well to the dilation.  She did not have any further issues with nausea or vomiting.  The EGD was negative for any potential bleeding sites.  She had a colonoscopy last year, which was significant for some polyps.  Past Medical History:  Diagnosis Date  . Anemia   . Aortic stenosis   . Bacterial sinusitis 09/17/2011  . CHF (congestive heart failure) ([place])   . CKD (chronic kidney disease) stage 4, GFR 15-29 ml/min ([place]) 08/11/2006   Cr continues to increase. Proteinuria on UA 02/10/12.    . Colitis   . CVA (cerebrovascular accident) [facility])    New hemorrhagic per CT scan '09  . Diverticulosis of colon   . Dysfunctional uterine bleeding   . ESRD (end stage renal disease) on dialysis ([place])    "MWF; [facility]" (11/27/2017)  . Fecal impaction ([place])   . Headache(784.0)   . Heart murmur   . HERNIORRHAPHY, HX OF 08/11/2006  . Hypertension   . OA (osteoarthritis)    bilateral knees  . Postmenopausal   . Pulmonary nodule   . TINEA CRURIS 01/12/2007    Past Surgical History:  Procedure Laterality Date  . ABDOMINAL HYSTERECTOMY    . AV FISTULA PLACEMENT Left 02/19/2017   Procedure: CREATION OF LEFT ARM BRACHIOCEPHALIC ARTERIOVENOUS (AV) FISTULA;  Surgeon: Rosetta Posner, MD;  Location: [place];  Service: Vascular;  Laterality: Left;   . BASCILIC VEIN TRANSPOSITION Left 04/23/2017   Procedure: BASILIC VEIN TRANSPOSITION SECOND STAGE;  Surgeon: Rosetta Posner, MD;  Location: [place];  Service: Vascular;  Laterality: Left;  . BIOPSY  02/20/2018   Procedure: BIOPSY;  Surgeon: Carol Ada, MD;  Location: [facility];  Service: Endoscopy;;  . CHOLECYSTECTOMY  2009  . COLONOSCOPY    . ESOPHAGOGASTRODUODENOSCOPY (EGD) WITH PROPOFOL N/A 02/20/2018   Procedure: ESOPHAGOGASTRODUODENOSCOPY (EGD) WITH PROPOFOL;  Surgeon: Carol Ada, MD;  Location: [facility];  Service: Endoscopy;  Laterality: N/A;  . INGUINAL HERNIA REPAIR  2008  . INSERTION OF DIALYSIS CATHETER Right 02/19/2017   Procedure: INSERTION OF TUNNELED DIALYSIS CATHETER - RIGHT INTERNAL JUGULAR PLACEMENT;  Surgeon: Rosetta Posner, MD;  Location: [place];  Service: Vascular;  Laterality: Right;  . IRIDOTOMY / IRIDECTOMY     Laser, right eye 12/26/11 left eye 01/24/12  . MASS EXCISION Left 05/07/2013   Procedure: EXCISION CYST;  Surgeon: Myrtha Mantis., MD;  Location: [name];  Service: Ophthalmology;  Laterality: Left;  . SAVORY DILATION N/A 02/20/2018   Procedure: SAVORY DILATION;  Surgeon: Carol Ada, MD;  Location: [facility];  Service: Endoscopy;  Laterality: N/A;    Family History  Problem Relation Age of Onset  . Hypertension Mother   . Congestive Heart Failure Mother   . Heart attack Brother 50  Social History:  reports that she has never smoked. She has never used smokeless tobacco. She reports that she does not drink alcohol or use drugs.  Allergies:  Allergies  Allergen Reactions  . Hydrocodone Nausea And Vomiting and Other (See Comments)    dizziness    Medications:  Scheduled: . [START ON 03/07/2018] Chlorhexidine Gluconate Cloth  6 each Topical Q0600  . diclofenac sodium  4 g Topical QID  . hydrocortisone cream   Topical BID  . mirtazapine  15 mg Oral QHS  . pantoprazole (PROTONIX) IV  40 mg Intravenous Q12H  .  sucroferric oxyhydroxide  500 mg Oral TID WC   Continuous:   Results for orders placed or performed during the hospital encounter of 03/06/18 (from the past 24 hour(s))  Occult blood card to lab, stool     Status: Abnormal   Collection Time: 03/06/18 12:27 PM  Result Value Ref Range   Fecal Occult Bld POSITIVE (A) NEGATIVE  Type and screen [place]     Status: None (Preliminary result)   Collection Time: 03/06/18 12:50 PM  Result Value Ref Range   ABO/RH(D) A POS    Antibody Screen NEG    Sample Expiration 03/09/2018    Unit Number [id number]    Blood Component Type RED CELLS,LR    Unit division 00    Status of Unit ISSUED    Transfusion Status OK TO TRANSFUSE    Crossmatch Result      Compatible Performed at [facility], [place] [address] [place], [place] [address]    Unit Number [id number]    Blood Component Type RED CELLS,LR    Unit division 00    Status of Unit ISSUED    Transfusion Status OK TO TRANSFUSE    Crossmatch Result Compatible   CBC with Differential/Platelet     Status: Abnormal   Collection Time: 03/06/18 12:50 PM  Result Value Ref Range   WBC 3.9 (L) 4.0 - 10.5 K/uL   RBC 1.99 (L) 3.87 - 5.11 MIL/uL   Hemoglobin 6.0 (LL) 12.0 - 15.0 g/dL   HCT 19.3 (L) 36.0 - 46.0 %   MCV 97.0 78.0 - 100.0 fL   MCH 30.2 26.0 - 34.0 pg   MCHC 31.1 30.0 - 36.0 g/dL   RDW 15.9 (H) 11.5 - 15.5 %   Platelets 166 150 - 400 K/uL   Neutrophils Relative % 61 %   Neutro Abs 2.4 1.7 - 7.7 K/uL   Lymphocytes Relative 23 %   Lymphs Abs 0.9 0.7 - 4.0 K/uL   Monocytes Relative 12 %   Monocytes Absolute 0.5 0.1 - 1.0 K/uL   Eosinophils Relative 2 %   Eosinophils Absolute 0.1 0.0 - 0.7 K/uL   Basophils Relative 1 %   Basophils Absolute 0.0 0.0 - 0.1 K/uL   Immature Granulocytes 1 %   Abs Immature Granulocytes 0.0 0.0 - 0.1 K/uL  Comprehensive metabolic panel     Status: Abnormal   Collection Time: 03/06/18 12:50 PM  Result Value Ref Range    Sodium 140 135 - 145 mmol/L   Potassium 4.0 3.5 - 5.1 mmol/L   Chloride 99 98 - 111 mmol/L   CO2 25 22 - 32 mmol/L   Glucose, Bld 93 70 - 99 mg/dL   BUN 68 (H) 8 - 23 mg/dL   Creatinine, Ser 10.21 (H) 0.44 - 1.00 mg/dL   Calcium 8.7 (L) 8.9 - 10.3 mg/dL   Total Protein 5.6 (  L) 6.5 - 8.1 g/dL   Albumin 3.0 (L) 3.5 - 5.0 g/dL   AST 12 (L) 15 - 41 U/L   ALT 8 0 - 44 U/L   Alkaline Phosphatase 34 (L) 38 - 126 U/L   Total Bilirubin 0.7 0.3 - 1.2 mg/dL   GFR calc non Af Amer 3 (L) >60 mL/min   GFR calc Af Amer 4 (L) >60 mL/min   Anion gap 16 (H) 5 - 15  Prepare RBC     Status: None   Collection Time: 03/06/18 12:50 PM  Result Value Ref Range   Order Confirmation      ORDER PROCESSED BY BLOOD BANK Performed at [facility], [place] [address] [place], [place] [address]   Protime-INR     Status: None   Collection Time: 03/06/18  1:45 PM  Result Value Ref Range   Prothrombin Time 13.4 11.4 - 15.2 seconds   INR 1.03      No results found.  ROS:  As stated above in the HPI otherwise negative.  Blood pressure 118/75, pulse 71, temperature (!) 97.5 F (36.4 C), temperature source Oral, resp. rate (!) 21, height 5\' 3"  (1.6 m), weight 57.8 kg, SpO2 (P) 100 %.    PE: Gen: NAD, Alert and Oriented HEENT:  [place]/AT, EOMI Neck: Supple, no LAD Lungs: CTA Bilaterally CV: RRR without M/G/R ABM: Soft, NTND, +BS Ext: No C/C/E  Assessment/Plan: 1) Anemia. 2) Melena. 3) ESRD.   The patient is hemodynamically stable.  It is unclear why she has bled, but the suspicion is that she has bleed from AVMs.  An EGD will be performed tomorrow.  If the procedure is negative, a repeat colonoscopy will be performed.  It is common to see AVMs in the setting of dialysis in the proximal colon.  Plan: 1) EGD tomorrow. 2) Follow HGB and transfuse as necessary.  Eugune Sine D 03/06/2018, 3:18 PM

## 2018-03-06 NOTE — Progress Notes (Signed)
New Admission Note:    Arrival Method:  Direct admit, wheelchair Mental Orientation: Alert and oriented x 4 Telemetry: No Telemetry order Assessment: Complete Skin: Assessed by 2 RNs IV: None. IV attempt x 2 by charge RN, x 1 by IV team Pain: Denies Tubes: None Safety Measures: Discussed calling for assistance, Use of bed alarm for safety Admission: Initiated 76M Orientation: Complete Family: Granddaughter, [name], at bedside Personal Belongings: Clothing and cane  Orders have been reviewed and implemented. Will continue to monitor the patient. Call light has been placed within reach and bed alarm has been activated.    [name], RN MSN [place] Phone number: [phone]

## 2018-03-06 NOTE — Assessment & Plan Note (Addendum)
The patient states that she started having dizziness episodes starting 3 days ago. The patient's granddaughter had called our on-call phone number the previous night (03/03/2018) scribing symptoms of being lightheaded when rising from a seated position starting following her recent hospital discharge on 02/01/2018.  The on-call doctor viewed the patient's medication and told her to stop taking her amlodipine which was recently increased from 5 to 10 mg daily and stopped taking her Remeron.  She was also to told to follow-up at [facility] acute care clinic.  At today's clinic visit the patient describes the dizziness as "the room crashing down" on her and lasting 30 min duration.  He states that the symptoms have been happening for the past 7 months but she has noted this episode occurring more frequently over the past 3 days.  She denies any accompanied nausea/vomiting, ringing in ear, changes in hearing, ear fullness, or recent falls. She has noticed that the dizziness occurs whenever she stands up to cook herself something.   Patient also mentioned that she has had diarrhea for the past 2 to 3 days occurring 5 times a day.  The patient states that her stool was dark in color and she might have seen blood in it. Her granddaughter who is accompanying her states that the patient cannot see that well and does not know if her grandmother truly saw blood in it.  Assessment and plan The patient's dizziness appears to be orthostatic in nature secondary to her recent diarrheal episodes and possibly from a GI bleed due to the dark-colored stools.  The patient's blood pressure during this visit was 129/44 off of her amlodipine.  She has orthostatic vital signs during clinic encounter was negative. Will check CBC to evaluate for potential blood loss.    Patient falling with her current dizziness.  Counseled the patient's granddaughter to monitor her more closely over the next few days and follow falls precautions.  -CBC  ordered -Continue holding amlodipine as the patient's current blood pressure is normotensive  -Avoid any NSAIDs, alcohol -Falls precautions -Return to clinic as soon as possible if dizziness continues to get worse  Addendum The patient CBC showed a four-point drop in hemoglobin that is more concerning for acute blood loss anemia. The patient last had an EGD on 02/20/2018 which showed a esophageal web in the upper third of the esophagus with dilation and a 2 cm hiatal hernia.  The patient was told to return to GI clinic in 4 weeks.  The patient's last colonoscopy was in April 2015 which showed diverticula in the sigmoid colon no other recommendations were made for further colonoscopies at that time.

## 2018-03-06 NOTE — Consult Note (Signed)
Reason for Consult: Anemia and Melena Referring Physician: Teaching Service  [place] HPI: This is a 77 year old female who is well-known to me with multiple medical problems admitted for melena and anemia.  Just prior to her dialysis today she was noted to have an anemia of 6.5 g/dL and she was instructed to present to [facility].  Starting Tuesday and reported problems with melena.  She denied any abdominal pain, nausea, vomiting, hematemesis, hematochezia, chest pain, or SOB.  He underwent an EGD on 02/20/2018 with findings of a cervical esophageal web that was dilated with an 18 mm Savary dilator.  Follow up in the office just last week showed that she responded very well to the dilation.  She did not have any further issues with nausea or vomiting.  The EGD was negative for any potential bleeding sites.  She had a colonoscopy last year, which was significant for some polyps.  Past Medical History:  Diagnosis Date  . Anemia   . Aortic stenosis   . Bacterial sinusitis 09/17/2011  . CHF (congestive heart failure) ([place])   . CKD (chronic kidney disease) stage 4, GFR 15-29 ml/min ([place]) 08/11/2006   Cr continues to increase. Proteinuria on UA 02/10/12.    . Colitis   . CVA (cerebrovascular accident) [facility])    New hemorrhagic per CT scan '09  . Diverticulosis of colon   . Dysfunctional uterine bleeding   . ESRD (end stage renal disease) on dialysis ([place])    "MWF; [facility]" (11/27/2017)  . Fecal impaction ([place])   . Headache(784.0)   . Heart murmur   . HERNIORRHAPHY, HX OF 08/11/2006  . Hypertension   . OA (osteoarthritis)    bilateral knees  . Postmenopausal   . Pulmonary nodule   . TINEA CRURIS 01/12/2007    Past Surgical History:  Procedure Laterality Date  . ABDOMINAL HYSTERECTOMY    . AV FISTULA PLACEMENT Left 02/19/2017   Procedure: CREATION OF LEFT ARM BRACHIOCEPHALIC ARTERIOVENOUS (AV) FISTULA;  Surgeon: Rosetta Posner, MD;  Location: [facility];  Service: Vascular;  Laterality: Left;   . BASCILIC VEIN TRANSPOSITION Left 04/23/2017   Procedure: BASILIC VEIN TRANSPOSITION SECOND STAGE;  Surgeon: Rosetta Posner, MD;  Location: [facility];  Service: Vascular;  Laterality: Left;  . BIOPSY  02/20/2018   Procedure: BIOPSY;  Surgeon: Carol Ada, MD;  Location: [facility];  Service: Endoscopy;;  . CHOLECYSTECTOMY  2009  . COLONOSCOPY    . ESOPHAGOGASTRODUODENOSCOPY (EGD) WITH PROPOFOL N/A 02/20/2018   Procedure: ESOPHAGOGASTRODUODENOSCOPY (EGD) WITH PROPOFOL;  Surgeon: Carol Ada, MD;  [facility];  Service: Endoscopy;  Laterality: N/A;  . INGUINAL HERNIA REPAIR  2008  . INSERTION OF DIALYSIS CATHETER Right 02/19/2017   Procedure: INSERTION OF TUNNELED DIALYSIS CATHETER - RIGHT INTERNAL JUGULAR PLACEMENT;  Surgeon: Rosetta Posner, MD;  Location: [facility];  Service: Vascular;  Laterality: Right;  . IRIDOTOMY / IRIDECTOMY     Laser, right eye 12/26/11 left eye 01/24/12  . MASS EXCISION Left 05/07/2013   Procedure: EXCISION CYST;  Surgeon: Myrtha Mantis., MD;  Location: [facility];  Service: Ophthalmology;  Laterality: Left;  . SAVORY DILATION N/A 02/20/2018   Procedure: SAVORY DILATION;  Surgeon: Carol Ada, MD;  [facility];  Service: Endoscopy;  Laterality: N/A;    Family History  Problem Relation Age of Onset  . Hypertension Mother   . Congestive Heart Failure Mother   . Heart attack Brother 35  Social History:  reports that she has never smoked. She has never used smokeless tobacco. She reports that she does not drink alcohol or use drugs.  Allergies:  Allergies  Allergen Reactions  . Hydrocodone Nausea And Vomiting and Other (See Comments)    dizziness    Medications:  Scheduled: . [START ON 03/07/2018] Chlorhexidine Gluconate Cloth  6 each Topical Q0600  . diclofenac sodium  4 g Topical QID  . hydrocortisone cream   Topical BID  . mirtazapine  15 mg Oral QHS  . pantoprazole (PROTONIX) IV  40 mg Intravenous Q12H  .  sucroferric oxyhydroxide  500 mg Oral TID WC   Continuous:   Results for orders placed or performed during the hospital encounter of 03/06/18 (from the past 24 hour(s))  Occult blood card to lab, stool     Status: Abnormal   Collection Time: 03/06/18 12:27 PM  Result Value Ref Range   Fecal Occult Bld POSITIVE (A) NEGATIVE  Type and screen [place]     Status: None (Preliminary result)   Collection Time: 03/06/18 12:50 PM  Result Value Ref Range   ABO/RH(D) A POS    Antibody Screen NEG    Sample Expiration 03/09/2018    Unit Number [id number]    Blood Component Type RED CELLS,LR    Unit division 00    Status of Unit ISSUED    Transfusion Status OK TO TRANSFUSE    Crossmatch Result      Compatible Performed at [facility], [place] [address], [place], [place] [address]    Unit Number [id number]    Blood Component Type RED CELLS,LR    Unit division 00    Status of Unit ISSUED    Transfusion Status OK TO TRANSFUSE    Crossmatch Result Compatible   CBC with Differential/Platelet     Status: Abnormal   Collection Time: 03/06/18 12:50 PM  Result Value Ref Range   WBC 3.9 (L) 4.0 - 10.5 K/uL   RBC 1.99 (L) 3.87 - 5.11 MIL/uL   Hemoglobin 6.0 (LL) 12.0 - 15.0 g/dL   HCT 19.3 (L) 36.0 - 46.0 %   MCV 97.0 78.0 - 100.0 fL   MCH 30.2 26.0 - 34.0 pg   MCHC 31.1 30.0 - 36.0 g/dL   RDW 15.9 (H) 11.5 - 15.5 %   Platelets 166 150 - 400 K/uL   Neutrophils Relative % 61 %   Neutro Abs 2.4 1.7 - 7.7 K/uL   Lymphocytes Relative 23 %   Lymphs Abs 0.9 0.7 - 4.0 K/uL   Monocytes Relative 12 %   Monocytes Absolute 0.5 0.1 - 1.0 K/uL   Eosinophils Relative 2 %   Eosinophils Absolute 0.1 0.0 - 0.7 K/uL   Basophils Relative 1 %   Basophils Absolute 0.0 0.0 - 0.1 K/uL   Immature Granulocytes 1 %   Abs Immature Granulocytes 0.0 0.0 - 0.1 K/uL  Comprehensive metabolic panel     Status: Abnormal   Collection Time: 03/06/18 12:50 PM  Result Value Ref Range    Sodium 140 135 - 145 mmol/L   Potassium 4.0 3.5 - 5.1 mmol/L   Chloride 99 98 - 111 mmol/L   CO2 25 22 - 32 mmol/L   Glucose, Bld 93 70 - 99 mg/dL   BUN 68 (H) 8 - 23 mg/dL   Creatinine, Ser 10.21 (H) 0.44 - 1.00 mg/dL   Calcium 8.7 (L) 8.9 - 10.3 mg/dL   Total Protein 5.6 (  L) 6.5 - 8.1 g/dL   Albumin 3.0 (L) 3.5 - 5.0 g/dL   AST 12 (L) 15 - 41 U/L   ALT 8 0 - 44 U/L   Alkaline Phosphatase 34 (L) 38 - 126 U/L   Total Bilirubin 0.7 0.3 - 1.2 mg/dL   GFR calc non Af Amer 3 (L) >60 mL/min   GFR calc Af Amer 4 (L) >60 mL/min   Anion gap 16 (H) 5 - 15  Prepare RBC     Status: None   Collection Time: 03/06/18 12:50 PM  Result Value Ref Range   Order Confirmation      ORDER PROCESSED BY BLOOD BANK Performed at [facility], [place] [address], [place], [place] [address]   Protime-INR     Status: None   Collection Time: 03/06/18  1:45 PM  Result Value Ref Range   Prothrombin Time 13.4 11.4 - 15.2 seconds   INR 1.03      No results found.  ROS:  As stated above in the HPI otherwise negative.  Blood pressure 118/75, pulse 71, temperature (!) 97.5 F (36.4 C), temperature source Oral, resp. rate (!) 21, height 5\' 3"  (1.6 m), weight 57.8 kg, SpO2 (P) 100 %.    PE: Gen: NAD, Alert and Oriented HEENT:  [place]/AT, EOMI Neck: Supple, no LAD Lungs: CTA Bilaterally CV: RRR without M/G/R ABM: Soft, NTND, +BS Ext: No C/C/E  Assessment/Plan: 1) Anemia. 2) Melena. 3) ESRD.   The patient is hemodynamically stable.  It is unclear why she has bled, but the suspicion is that she has bleed from AVMs.  An EGD will be performed tomorrow.  If the procedure is negative, a repeat colonoscopy will be performed.  It is common to see AVMs in the setting of dialysis in the proximal colon.  Plan: 1) EGD tomorrow. 2) Follow HGB and transfuse as necessary.  Leylany Nored D 03/06/2018, 3:18 PM

## 2018-03-06 NOTE — Procedures (Signed)
I was present at this dialysis session, have reviewed the session itself and made  appropriate changes. 2nd unit of pRBC being hung currently.  Tolerating well.   Jannifer Hick MD [facility] pager [phone]   03/06/2018, 4:08 PM

## 2018-03-06 NOTE — Progress Notes (Signed)
Reviewed patient's CBC that was drawn at clinic visit on 03/05/2018 which resulted with a low hemoglobin of 6.5 from baseline of 10.  Since his reticulocyte count is unknown, but she has a accompanied RDW of 16.4 which is more suggestive of acute blood loss.  The patient has been having a 2-3 day history of dark-colored diarrhea and concern for an acute GI bleed.  Assessment and plan The patient's acute drop in hemoglobin is concerning for acute blood loss anemia. It is unlikely that her esrd will cause such a degree of drop in hemoglobin.  Was able to reach out to the patient's daughter and informed her of her the patient's low blood counts and need for hospitalization to work-up acute bleed.  The patient had just left to that hemodialysis.  Informed patient's daughter to bring her straight to the hospital and that the hemodialysis will be completed in hospital along with her GI work-up.   Is able to get patient a bed placement at 36M 14.  The nurse will call the inpatient service when the patient has reached the hospital.  Lars Mage, MD Internal Medicine PGY2 Pager:[phone] 03/06/2018, 11:11 AM

## 2018-03-06 NOTE — H&P (Addendum)
Date: 03/06/2018               Patient Name:  Nancy [name] MRN: [id number]  DOB: April 05, 1941 Age / Sex: 77 y.o., female   PCP: Aldine Contes, MD         Medical Service: Internal Medicine Teaching Service         Attending Physician: Dr. Oval Linsey, MD    First Contact: Trixie Rude, OMS IV Pager: [phone]  Second Contact: Dr. Ina Homes  Pager: [phone]       After Hours (After 5p/  First Contact Pager: [phone]  weekends / holidays): Second Contact Pager: [phone]   Chief Complaint: Dark stools  History of Present Illness:  This is a 77 y.o african [place] female with a PMHx of ESRD who was at the dialysis center to get dialysis when she was called to come to the hospital for further evaluation of an acute GI bleed with a HgB of 6.5 (her normal baseline HgB is 10). She normally dialyzes M/W/F, but missed the dialysis on Wednesday and Friday of this week. Upon admission, she reports experiencing 5 episodes of dark tarry stools with blood on the toilet paper and toilet water over the past 3 days that finally cleared this morning with the presence of normal brown stools. She states she has been feeling weak and dizzy with dysphagia over the past 3 days as well, but denies any fever, chills, chest pain, shortness of breath, nausea, vomiting, abdominal pain or further complaints. She has had similar episodes in the past but did not seek medical attention for these episodes. She denies the use of NSAIDs, EtOH, or tobacco. Typically the patient struggles with constipation.   Per chart review she had an EGD on 8/16 which revealed an esophageal web in the upper third of her esophagus, 2cm hiatal hernia, and findings consistent with GERD. Last colonoscopy was in 2015 that illustrated some diverticulosis.   Meds:  Amlodipine 10 mg QD  Aspirin 81 mg QD Calcitriol 0.5 mcg  Voltaren Gel Hydrocortisone cream  Mirtazapine 15 mg QHS  SL nitro 0.3 mg QD Pantoprazole 40 mg  QD Promethazine 12.5 mg q6 hours PRN Sucroferric Oxyhydroxide 500 mg TID  Allergies: Allergies as of 03/06/2018 - Review Complete 03/05/2018  Allergen Reaction Noted  . Hydrocodone Nausea And Vomiting and Other (See Comments) 12/26/2017   Past Medical History:  Diagnosis Date  . Anemia   . Aortic stenosis   . Bacterial sinusitis 09/17/2011  . CHF (congestive heart failure) ([place])   . CKD (chronic kidney disease) stage 4, GFR 15-29 ml/min ([place]) 08/11/2006   Cr continues to increase. Proteinuria on UA 02/10/12.    . Colitis   . CVA (cerebrovascular accident) [facility])    New hemorrhagic per CT scan '09  . Diverticulosis of colon   . Dysfunctional uterine bleeding   . ESRD (end stage renal disease) on dialysis ([place])    "MWF; [facility]" (11/27/2017)  . Fecal impaction ([place])   . Headache(784.0)   . Heart murmur   . HERNIORRHAPHY, HX OF 08/11/2006  . Hypertension   . OA (osteoarthritis)    bilateral knees  . Postmenopausal   . Pulmonary nodule   . TINEA CRURIS 01/12/2007   Family History:  Denies a FMHx of ESRD, DM, autoimmune disease  Social History:  Previously worked as a Sports coach  She lives on her own Performs all her ADLs independently  Denies the use of tobacco, EtOH, or illicit  substances  Review of Systems: A complete ROS was negative except as per HPI.   Physical Exam: Blood pressure (!) 144/59, pulse 93, temperature (!) 97.5 F (36.4 C), temperature source Oral, resp. rate (!) 25, height 5\' 3"  (1.6 m), weight 57.8 kg, SpO2 100 %.   Physical Exam  Constitutional: She appears well-developed and well-nourished.  HENT:  Head: Normocephalic and atraumatic.  Mouth/Throat: Oropharynx is clear and moist.  Eyes: Pupils are equal, round, and reactive to light. Conjunctivae and EOM are normal.  Pale conjunctiva  Neck: Normal range of motion. Neck supple.  Cardiovascular: Normal rate, regular rhythm and intact distal pulses.  Murmur heard. Systolic murmur  Respiratory:  Effort normal and breath sounds normal. No respiratory distress. She has no wheezes. She has no rales.  GI: Soft. Bowel sounds are normal. She exhibits no mass. There is tenderness (in the left lower quadrant). There is no rebound and no guarding.  Genitourinary: Rectal exam shows guaiac positive stool (Rectal prolapse appreciated, but no internal or external hemorrhoids).  Genitourinary Comments: Small prolapsed internal hemorrhoid  Musculoskeletal: Normal range of motion. She exhibits no edema or deformity.  Neurological: She is alert.  Skin: Skin is warm and dry. No rash noted. No erythema.  Scattered hyperpigmented areas noted in the back diffusely   Assessment & Plan by Problem: Active Problems:   Symptomatic anemia   Acute on chronic blood loss anemia  This is a 77 y.o african [place] female with a PMHx of ESRD on dialysis M/W/F presenting with an acute on chronic anemia likely secondary to an UGIB.   Acute Upper GI Bleed, unknown etiology. Patient is presenting with 3-4 days of melena and tarry stools, that have since resolved. Her BUN is elevated to 68; however, is of little help as the patient has not had HD since 8/26. She is hemodynamically stable and orthostatic vitals are negative. On PE she does have aortic stenosis which raises the possibility of Heyde's syndrome. Recent EGD rules out other common pathology including gastritis, gastric and proximal duodenal ulcers, and proximal AMVs. She has had a GI bleed in the past and is baseline anemic with a HgB of 10. Her HgB currently is 6.5 so will order type and cross, transfuse 1 unit of blood and start her on a PPI. GI was consulted and will evaluate the patient for repeat EGD. If unrevealing she may need capsule endoscopy vs push endoscopy vs colonoscopy to assess for proximal colon lesions. We appreciate any further recommendations by them. Will repeat CBC in the morning, check PT-INR and keep pt NPO.  ESRD on HD MWF. Nephrology has  been consulted and is planning to take the patient to HD.   Pityriasis Rose[place]. Will start on hydrocortisone cream  Diet: NPO DVT Prophylaxis: SCD Code: DNR  Dispo: Admit patient to Inpatient with expected length of stay greater than 2 midnights.  Signed: Ina Homes, MD 03/06/2018, 2:07 PM

## 2018-03-06 NOTE — Consult Note (Addendum)
[facility] Renal Consultation Note    Indication for Consultation:  Management of ESRD/hemodialysis, anemia, hypertension/volume, and secondary hyperparathyroidism. PCP:  HPI: Nancy [name] is a 77 y.o. female with ESRD, HTN, aortic stenosis, and osteoarthritis who was admitted for GI bleed.  Pt reported having dark colored diarrhea for 2 days. She missed her HD session on 8/28 due to the diarrhea. No fever or chills, no sharp abdominal pains. She saw her PCP on 8/29 for concern of spot on her back (looks like seborrheic keratosis). Had labs done at her visit. Today, the labs resulted with Hgb 6.5 - down from Hgb 10.1 on 02/25/18. She was called and directed to come directly to the hospital for admission and evaluation of GI bleed. Repeat labs today show Hgb 6. + FOBT. Transfusion ordered, pending.  S/p recent admit 7/23-7/26 for persistent N/V. Following this, she did undergo EGD on 8/16 which showed normal stomach/duodenum, but small hiatal hernia and esophageal web which was dilated. No ulcerations or AVMs noted. She reports that her last colonoscopy was 5 years ago. Abd CT 01/25/18 showed diverticulosis.  Dialyzes on MWF schedule at [facility]. Last HD 8/26 - missed her last HD d/t the above diarrhea. At this time, diarrhea resolved. No CP, dyspnea, fever, chills, N/V, edema.  Past Medical History:  Diagnosis Date  . Anemia   . Aortic stenosis   . Bacterial sinusitis 09/17/2011  . CHF (congestive heart failure) ([place])   . CKD (chronic kidney disease) stage 4, GFR 15-29 ml/min ([place]) 08/11/2006   Cr continues to increase. Proteinuria on UA 02/10/12.    . Colitis   . CVA (cerebrovascular accident) [facility])    New hemorrhagic per CT scan '09  . Diverticulosis of colon   . Dysfunctional uterine bleeding   . ESRD (end stage renal disease) on dialysis ([place])    "MWF; [facility]" (11/27/2017)  . Fecal impaction ([place])   . Headache(784.0)   . Heart murmur   . HERNIORRHAPHY, HX  OF 08/11/2006  . Hypertension   . OA (osteoarthritis)    bilateral knees  . Postmenopausal   . Pulmonary nodule   . TINEA CRURIS 01/12/2007   Past Surgical History:  Procedure Laterality Date  . ABDOMINAL HYSTERECTOMY    . AV FISTULA PLACEMENT Left 02/19/2017   Procedure: CREATION OF LEFT ARM BRACHIOCEPHALIC ARTERIOVENOUS (AV) FISTULA;  Surgeon: Rosetta Posner, MD;  Location: [place];  Service: Vascular;  Laterality: Left;  . BASCILIC VEIN TRANSPOSITION Left 04/23/2017   Procedure: BASILIC VEIN TRANSPOSITION SECOND STAGE;  Surgeon: Rosetta Posner, MD;  Location: [place];  Service: Vascular;  Laterality: Left;  . BIOPSY  02/20/2018   Procedure: BIOPSY;  Surgeon: Carol Ada, MD;  Location: [facility];  Service: Endoscopy;;  . CHOLECYSTECTOMY  2009  . COLONOSCOPY    . ESOPHAGOGASTRODUODENOSCOPY (EGD) WITH PROPOFOL N/A 02/20/2018   Procedure: ESOPHAGOGASTRODUODENOSCOPY (EGD) WITH PROPOFOL;  Surgeon: Carol Ada, MD;  Location: [facility];  Service: Endoscopy;  Laterality: N/A;  . INGUINAL HERNIA REPAIR  2008  . INSERTION OF DIALYSIS CATHETER Right 02/19/2017   Procedure: INSERTION OF TUNNELED DIALYSIS CATHETER - RIGHT INTERNAL JUGULAR PLACEMENT;  Surgeon: Rosetta Posner, MD;  Location: [place];  Service: Vascular;  Laterality: Right;  . IRIDOTOMY / IRIDECTOMY     Laser, right eye 12/26/11 left eye 01/24/12  . MASS EXCISION Left 05/07/2013   Procedure: EXCISION CYST;  Surgeon: Myrtha Mantis., MD;  Location: [place];  Service: Ophthalmology;  Laterality: Left;  . SAVORY DILATION N/A 02/20/2018   Procedure: SAVORY DILATION;  Surgeon: Carol Ada, MD;  Location[facility];  Service: Endoscopy;  Laterality: N/A;   Family History  Problem Relation Age of Onset  . Hypertension Mother   . Congestive Heart Failure Mother   . Heart attack Brother 31   Social History:  reports that she has never smoked. She has never used smokeless tobacco. She reports that she does not  drink alcohol or use drugs.  ROS: As per HPI otherwise negative.  Physical Exam: Vitals:   03/06/18 1221  BP: (!) 144/59  Pulse: 93  Resp: (!) 25  Temp: (!) 97.5 F (36.4 C)  TempSrc: Oral  SpO2: 100%  Weight: 57.8 kg  Height: 5\' 3"  (1.6 m)     General: Frail woman, NAD. Visual impairment. Head: Normocephalic, atraumatic, sclera non-icteric, mucus membranes are moist. Neck: Supple without lymphadenopathy/masses. Lungs: Clear bilaterally to auscultation without wheezes, rales, or rhonchi.  Heart: RRR; 4/6 systolic murmur present. Abdomen: Soft, non-tender, non-distended with normoactive bowel sounds. No rebound/guarding.  Musculoskeletal:  Strength and tone appear normal for age. Lower extremities: Trace LE edema. No visualized wounds. Neuro: Alert and oriented X 3. Moves all extremities spontaneously. Psych:  Responds to questions appropriately with a normal affect. Dialysis Access: LUE AVF + thrill  Allergies  Allergen Reactions  . Hydrocodone Nausea And Vomiting and Other (See Comments)    dizziness   Prior to Admission medications   Medication Sig Start Date End Date Taking? Authorizing Provider  amLODipine (NORVASC) 10 MG tablet Take 1 tablet (10 mg total) by mouth daily. 02/02/18   [name], MD  aspirin EC 81 MG EC tablet Take 1 tablet (81 mg total) by mouth daily. Patient not taking: Reported on 02/19/2018 12/10/16   Kalman Shan Ratliff, DO  calcitRIOL (ROCALTROL) 0.5 MCG capsule Take 4 capsules (2 mcg total) by mouth every Monday, Wednesday, and Friday with hemodialysis. Patient not taking: Reported on 02/19/2018 12/30/17   Neva Seat, MD  diclofenac sodium (VOLTAREN) 1 % GEL Apply 4 g topically 4 (four) times daily. Patient taking differently: Apply 4 g topically 4 (four) times daily as needed (pain).  01/20/18   Mosetta Anis, MD  hydrocortisone cream 0.5 % Apply 1 application topically 2 (two) times daily. 03/05/18   Chundi, Verne Spurr, MD  mirtazapine  (REMERON) 15 MG tablet Take 1 tablet (15 mg total) by mouth at bedtime. 02/01/18   [name], MD  nitroGLYCERIN (NITROSTAT) 0.3 MG SL tablet Place 1 tablet (0.3 mg total) under the tongue every 5 (five) minutes as needed for chest pain. 02/27/17   Sid Falcon, MD  pantoprazole (PROTONIX) 40 MG tablet Take 1 tablet (40 mg total) by mouth daily. 01/14/18 02/13/18  [name], MD  promethazine (PHENERGAN) 12.5 MG tablet Take 1 tablet (12.5 mg total) by mouth every 6 (six) hours as needed for nausea or vomiting (Take 1 prior to dialysis). Take 1 tablet half an hour before meal. 02/01/18   [name], MD  sucroferric oxyhydroxide (VELPHORO) 500 MG chewable tablet Chew 500 mg by mouth 3 (three) times daily with meals.    Rexene Agent, MD   Current Facility-Administered Medications  Medication Dose Route Frequency Provider Last Rate Last Dose  . 0.9 %  sodium chloride infusion (Manually program via Guardrails IV Fluids)   Intravenous Once Ina Homes, MD      . acetaminophen (TYLENOL) tablet 650 mg  650  mg Oral Q6H PRN Ina Homes, MD       Or  . acetaminophen (TYLENOL) suppository 650 mg  650 mg Rectal Q6H PRN Ina Homes, MD      . Derrill Memo ON 03/09/2018] calcitRIOL (ROCALTROL) capsule 2 mcg  2 mcg Oral Q M,W,F-HD Ina Homes, MD      . Derrill Memo ON 03/07/2018] Chlorhexidine Gluconate Cloth 2 % PADS 6 each  6 each Topical Q0600 Loren Racer, PA-C      . diclofenac sodium (VOLTAREN) 1 % transdermal gel 4 g  4 g Topical QID Helberg, Justin, MD      . hydrocortisone cream 1 %   Topical BID Oval Linsey, MD      . mirtazapine (REMERON) tablet 15 mg  15 mg Oral QHS Helberg, Justin, MD      . pantoprazole (PROTONIX) injection 40 mg  40 mg Intravenous Q12H Helberg, Larkin Ina, MD      . promethazine (PHENERGAN) tablet 12.5 mg  12.5 mg Oral Q6H PRN Ina Homes, MD      . sucroferric oxyhydroxide (VELPHORO) chewable tablet 500 mg  500 mg Oral TID WC Ina Homes, MD        Labs: Basic Metabolic Panel: Recent Labs  Lab 03/06/18 1250  NA 140  K 4.0  CL 99  CO2 25  GLUCOSE 93  BUN 68*  CREATININE 10.21*  CALCIUM 8.7*   Liver Function Tests: Recent Labs  Lab 03/06/18 1250  AST 12*  ALT 8  ALKPHOS 34*  BILITOT 0.7  PROT 5.6*  ALBUMIN 3.0*   CBC: Recent Labs  Lab 03/05/18 1458 03/06/18 1250  WBC 3.7 3.9*  NEUTROABS  --  2.4  HGB 6.5* 6.0*  HCT 19.7* 19.3*  MCV 94 97.0  PLT 196 166   Dialysis Orders:  MWF at [facility], 2K/2Ca, EDW 55.5kg. BFR300/DFR 800, 16g needles, AVF, heparin 3K bolus - Mircera 293mcg IV q 2 weeks (last 8/14, Hgb 10.1 on 8/21)  Assessment/Plan: 1.  Melena/ABLA: IV PPI started. S/p relatively normal EGD < 2 weeks ago. Hx diverticulosis. Consider colonoscopy. Hgb 6; for 2U PRBCs today while on dialysis. 2.  ESRD: Usually MWF schedule, but missed her last HD - last dialyzed Monday. For HD today, as above. 3.  Hypertension/volume: Very mild edema, minimal UF today and getting blood. 4.  Metabolic bone disease: Ca ok. Phos pending - typically high. D/c'd calcitriol from med list - does not get this anymore d/t Hx hypercalcemia. Continue Velphoro as binder once cleared to eat.  Veneta Penton, PA-C 03/06/2018, 1:45 PM  [employer] Pager: [phone]

## 2018-03-07 ENCOUNTER — Encounter (HOSPITAL_COMMUNITY)
Admission: AD | Disposition: A | Discharge status: Home / Self Care | Payer: Self-pay | Source: Ambulatory Visit | Attending: Internal Medicine

## 2018-03-07 ENCOUNTER — Encounter (HOSPITAL_COMMUNITY): Payer: Self-pay

## 2018-03-07 DIAGNOSIS — K449 Diaphragmatic hernia without obstruction or gangrene: Secondary | ICD-10-CM

## 2018-03-07 DIAGNOSIS — I120 Hypertensive chronic kidney disease with stage 5 chronic kidney disease or end stage renal disease: Secondary | ICD-10-CM

## 2018-03-07 DIAGNOSIS — N186 End stage renal disease: Secondary | ICD-10-CM

## 2018-03-07 DIAGNOSIS — D62 Acute posthemorrhagic anemia: Secondary | ICD-10-CM | POA: Diagnosis not present

## 2018-03-07 DIAGNOSIS — K2951 Unspecified chronic gastritis with bleeding: Secondary | ICD-10-CM | POA: Diagnosis not present

## 2018-03-07 DIAGNOSIS — Z992 Dependence on renal dialysis: Secondary | ICD-10-CM | POA: Diagnosis not present

## 2018-03-07 DIAGNOSIS — E785 Hyperlipidemia, unspecified: Secondary | ICD-10-CM | POA: Diagnosis not present

## 2018-03-07 DIAGNOSIS — K921 Melena: Secondary | ICD-10-CM | POA: Diagnosis not present

## 2018-03-07 DIAGNOSIS — K319 Disease of stomach and duodenum, unspecified: Secondary | ICD-10-CM | POA: Diagnosis not present

## 2018-03-07 DIAGNOSIS — Z9889 Other specified postprocedural states: Secondary | ICD-10-CM

## 2018-03-07 DIAGNOSIS — K2990 Gastroduodenitis, unspecified, without bleeding: Secondary | ICD-10-CM | POA: Diagnosis not present

## 2018-03-07 DIAGNOSIS — K2981 Duodenitis with bleeding: Secondary | ICD-10-CM

## 2018-03-07 DIAGNOSIS — R011 Cardiac murmur, unspecified: Secondary | ICD-10-CM

## 2018-03-07 DIAGNOSIS — I350 Nonrheumatic aortic (valve) stenosis: Secondary | ICD-10-CM | POA: Diagnosis not present

## 2018-03-07 DIAGNOSIS — Z79899 Other long term (current) drug therapy: Secondary | ICD-10-CM | POA: Diagnosis not present

## 2018-03-07 DIAGNOSIS — K219 Gastro-esophageal reflux disease without esophagitis: Secondary | ICD-10-CM | POA: Diagnosis not present

## 2018-03-07 DIAGNOSIS — Z8673 Personal history of transient ischemic attack (TIA), and cerebral infarction without residual deficits: Secondary | ICD-10-CM

## 2018-03-07 DIAGNOSIS — Z885 Allergy status to narcotic agent status: Secondary | ICD-10-CM

## 2018-03-07 DIAGNOSIS — K228 Other specified diseases of esophagus: Secondary | ICD-10-CM

## 2018-03-07 DIAGNOSIS — D509 Iron deficiency anemia, unspecified: Secondary | ICD-10-CM | POA: Diagnosis not present

## 2018-03-07 DIAGNOSIS — L42 Pityriasis rosea: Secondary | ICD-10-CM | POA: Diagnosis not present

## 2018-03-07 DIAGNOSIS — K922 Gastrointestinal hemorrhage, unspecified: Secondary | ICD-10-CM | POA: Diagnosis not present

## 2018-03-07 DIAGNOSIS — I150 Renovascular hypertension: Secondary | ICD-10-CM | POA: Diagnosis not present

## 2018-03-07 DIAGNOSIS — Z7982 Long term (current) use of aspirin: Secondary | ICD-10-CM | POA: Diagnosis not present

## 2018-03-07 DIAGNOSIS — K2900 Acute gastritis without bleeding: Secondary | ICD-10-CM

## 2018-03-07 DIAGNOSIS — N2581 Secondary hyperparathyroidism of renal origin: Secondary | ICD-10-CM | POA: Diagnosis not present

## 2018-03-07 HISTORY — PX: ESOPHAGOGASTRODUODENOSCOPY: SHX5428

## 2018-03-07 HISTORY — PX: BIOPSY: SHX5522

## 2018-03-07 LAB — TYPE AND SCREEN
ABO/RH(D): A POS
ANTIBODY SCREEN: NEGATIVE
UNIT DIVISION: 0
UNIT DIVISION: 0

## 2018-03-07 LAB — BPAM RBC
BLOOD PRODUCT EXPIRATION DATE: 201909152359
BLOOD PRODUCT EXPIRATION DATE: 201909192359
ISSUE DATE / TIME: 201908301357
ISSUE DATE / TIME: 201908301357
UNIT TYPE AND RH: 6200
UNIT TYPE AND RH: 6200

## 2018-03-07 LAB — CBC
HCT: 31.2 % — ABNORMAL LOW (ref 36.0–46.0)
Hemoglobin: 10.5 g/dL — ABNORMAL LOW (ref 12.0–15.0)
MCH: 29.7 pg (ref 26.0–34.0)
MCHC: 33.7 g/dL (ref 30.0–36.0)
MCV: 88.1 fL (ref 78.0–100.0)
PLATELETS: 153 10*3/uL (ref 150–400)
RBC: 3.54 MIL/uL — ABNORMAL LOW (ref 3.87–5.11)
RDW: 16.1 % — ABNORMAL HIGH (ref 11.5–15.5)
WBC: 5.6 10*3/uL (ref 4.0–10.5)

## 2018-03-07 SURGERY — EGD (ESOPHAGOGASTRODUODENOSCOPY)
Anesthesia: Moderate Sedation

## 2018-03-07 MED ORDER — MIDAZOLAM HCL 5 MG/ML IJ SOLN
INTRAMUSCULAR | Status: AC
  Filled 2018-03-07: qty 2

## 2018-03-07 MED ORDER — MIDAZOLAM HCL 10 MG/2ML IJ SOLN
INTRAMUSCULAR | Status: DC | PRN
  Administered 2018-03-07: 1 mg via INTRAVENOUS
  Administered 2018-03-07 (×3): 2 mg via INTRAVENOUS

## 2018-03-07 MED ORDER — FENTANYL CITRATE (PF) 100 MCG/2ML IJ SOLN
INTRAMUSCULAR | Status: DC | PRN
  Administered 2018-03-07 (×4): 25 ug via INTRAVENOUS

## 2018-03-07 MED ORDER — FENTANYL CITRATE (PF) 100 MCG/2ML IJ SOLN
INTRAMUSCULAR | Status: AC
  Filled 2018-03-07: qty 2

## 2018-03-07 NOTE — Discharge Instructions (Addendum)
Thank you for allowing [place] to care for you during your admission at [facility]. Please proceed with the recommended follow up appointments

## 2018-03-07 NOTE — Op Note (Signed)
[facility] Patient Name: Nancy [name] Procedure Date : 03/07/2018 MRN: [id number] Attending MD: Carol Ada , MD Date of Birth: 1941-02-23 CSN: [id number] Age: 77 Admit Type: Inpatient Procedure:                Upper GI endoscopy Indications:              Acute post hemorrhagic anemia, Melena Providers:                Carol Ada, MD, Baird Cancer, RN, Nevin Bloodgood,                            Technician Referring MD:              Medicines:                Midazolam 7 mg IV, Fentanyl 100 micrograms IV Complications:            No immediate complications. Estimated Blood Loss:     Estimated blood loss: none. Procedure:                Pre-Anesthesia Assessment:                           - Prior to the procedure, a History and Physical                            was performed, and patient medications and                            allergies were reviewed. The patient's tolerance of                            previous anesthesia was also reviewed. The risks                            and benefits of the procedure and the sedation                            options and risks were discussed with the patient.                            All questions were answered, and informed consent                            was obtained. Prior Anticoagulants: The patient has                            taken no previous anticoagulant or antiplatelet                            agents. ASA Grade Assessment: III - A patient with                            severe systemic disease. After reviewing the risks  and benefits, the patient was deemed in                            satisfactory condition to undergo the procedure.                           - Sedation was administered by an endoscopy nurse.                            The sedation level attained was moderate.                           After obtaining informed consent, the endoscope was                             passed under direct vision. Throughout the                            procedure, the patient's blood pressure, pulse, and                            oxygen saturations were monitored continuously. The                            [id number]-H190 ([id number]) Olympus adult EGD was introduced                            through the mouth, and advanced to the third part                            of duodenum. The upper GI endoscopy was                            accomplished without difficulty. The patient                            tolerated the procedure well. Scope In: Scope Out: Findings:      A 2 cm hiatal hernia was present.      A non-obstructing and mild Schatzki ring was found in the lower third of       the esophagus.      A single localized, 2 mm non-bleeding erosion was found in the gastric       antrum. There were no stigmata of recent bleeding. Biopsies were taken       with a cold forceps for histology.      Localized mild inflammation characterized by erythema was found in the       gastric antrum.      Patchy moderately erythematous mucosa without active bleeding but there       was the suggesting of possible bleeding found in the duodenal bulb. Impression:               - 2 cm hiatal hernia.                           - Non-obstructing and  mild Schatzki ring.                           - Non-bleeding erosive gastropathy. Biopsied.                           - Gastritis.                           - Erythematous duodenopathy. Recommendation:           - Return patient to hospital ward for ongoing care.                           - Resume regular diet.                           - Continue present medications.                           - Await pathology results.                           - PPI QD. Procedure Code(s):        --- Professional ---                           [phone], Esophagogastroduodenoscopy, flexible,                            transoral; with biopsy, single or  multiple Diagnosis Code(s):        --- Professional ---                           K31.89, Other diseases of stomach and duodenum                           K44.9, Diaphragmatic hernia without obstruction or                            gangrene                           K22.2, Esophageal obstruction                           K29.70, Gastritis, unspecified, without bleeding                           D62, Acute posthemorrhagic anemia                           K92.1, Melena (includes Hematochezia) CPT copyright 2017 American Medical Association. All rights reserved. The codes documented in this report are preliminary and upon coder review may  be revised to meet current compliance requirements. Carol Ada, MD Carol Ada, MD 03/07/2018 9:40:37 AM This report has been signed electronically. Number of Addenda: 0

## 2018-03-07 NOTE — Interval H&P Note (Signed)
History and Physical Interval Note:  03/07/2018 8:59 AM  Nancy [name]  has presented today for surgery, with the diagnosis of GI bleed  The various methods of treatment have been discussed with the patient and family. After consideration of risks, benefits and other options for treatment, the patient has consented to  Procedure(s): ESOPHAGOGASTRODUODENOSCOPY (EGD) (N/A) as a surgical intervention .  The patient's history has been reviewed, patient examined, no change in status, stable for surgery.  I have reviewed the patient's chart and labs.  Questions were answered to the patient's satisfaction.     Jashua Knaak D

## 2018-03-07 NOTE — Progress Notes (Signed)
Discharge instructions reviewed with patient. Verified understanding using teach back. No prescriptions provided. Bartholomew Crews, RN

## 2018-03-07 NOTE — Progress Notes (Signed)
Patient escorted out via wheelchair by RN. Bartholomew Crews, RN

## 2018-03-07 NOTE — Telephone Encounter (Signed)
Entered by mistake

## 2018-03-07 NOTE — Progress Notes (Addendum)
   Subjective: Mrs. Nickle is a 77 y.o AA F who initially presented with an UGIB and Hgb of 6.5. She was transfused 2 units of pRBC overnight with improvement of her HgB to 10.5. I evaluated the patient after she returned from her EGD but she was still very groggy and non-alert. Left voicemail for granddaughter [name]) and once call is returned, will discuss with granddaughter the plans to d/c pt home today with PPI and outpt f/u with GI on Wednesday 03/11/18  Objective:  Vital signs in last 24 hours: Vitals:   03/07/18 0859 03/07/18 0910 03/07/18 0915 03/07/18 0920  BP: (!) 181/72  (!) 170/63 (!) 154/52  Pulse: 96 95 94 99  Resp: 15 16 18 17   Temp: 98.5 F (36.9 C)     TempSrc: Oral     SpO2: 100% 100% 100% 100%  Weight:      Height:       Physical Exam  Constitutional:  Groggy secondary to EGD anesthesia  HENT:  Head: Normocephalic and atraumatic.  Neck: Normal range of motion. Neck supple.  Cardiovascular: Normal rate and regular rhythm. Exam reveals no gallop and no friction rub.  Murmur heard.  Systolic (in the R 2nd ICS) murmur is present. Pulmonary/Chest: Effort normal and breath sounds normal. No respiratory distress. She has no wheezes. She has no rales. She exhibits no tenderness.  Abdominal: Soft. Bowel sounds are normal. She exhibits no distension and no mass. There is no tenderness. There is no rebound and no guarding.  Musculoskeletal: She exhibits edema (mild edema to BLE).    Assessment/Plan:  This is a 77 y.o AA F presenting with an UGIB with improvement of her HgB from 6.5 to 10.5 after 2 units of pRBC  Active Problems:   Symptomatic anemia   Acute on chronic blood loss anemia  UGIB. HgB has improved after transfusion to 10.5. EGD revealed non-obstructing mild Schatzki's Ring, 78mm non-bleeding erosive gastropathy w/ biopsy pending and erythematous duodenopathy that could potentially be the source of the bleed. Will appreciate GI recommendations of d/c today  w/ continued PPI and f/u with GI (Dr Benson Norway) on Wednesday 03/11/18  ESRD. Nephrology dialyzed the pt yesterday  Dispo: Anticipated discharge today  Trixie Rude, Medical Student 03/07/2018, 9:41 AM Pager: [phone]  Attestation for Student Documentation:  I personally was present and performed or re-performed the history, physical exam and medical decision-making activities of this service and have verified that the service and findings are accurately documented in the student's note.  Neva Seat, MD 03/07/2018, 12:18 PM

## 2018-03-07 NOTE — Progress Notes (Signed)
Internal Medicine Attending  Date: 03/07/2018  Patient name: Nancy [name] Medical record number: [id number] Date of birth: 11-16-1940 Age: 77 y.o. Gender: female  I saw and evaluated the patient. I reviewed the medical student's/resident's note by Mr. Lehman Prom and I agree with the medical student's/resident's findings and plans as documented in their progress note.

## 2018-03-07 NOTE — Progress Notes (Signed)
  [facility] Progress Note   Subjective:     Objective Vitals:   03/07/18 0937 03/07/18 0940 03/07/18 0945 03/07/18 0950  BP: (!) 118/31 (!) 118/31 (!) 117/40 (!) 122/44  Pulse: 94 95 93 94  Resp: 12 15 (!) 26 (!) 23  Temp: 98.7 F (37.1 C)     TempSrc: Oral     SpO2: 99% 98% 97% 98%  Weight:      Height:       Physical Exam General: Heart: Lungs: Abdomen: Extremities: Dialysis Access:   Additional Objective Labs: Basic Metabolic Panel: Recent Labs  Lab 03/06/18 1250  NA 140  K 4.0  CL 99  CO2 25  GLUCOSE 93  BUN 68*  CREATININE 10.21*  CALCIUM 8.7*   Liver Function Tests: Recent Labs  Lab 03/06/18 1250  AST 12*  ALT 8  ALKPHOS 34*  BILITOT 0.7  PROT 5.6*  ALBUMIN 3.0*   No results for input(s): LIPASE, AMYLASE in the last 168 hours. CBC: Recent Labs  Lab 03/05/18 1458 03/06/18 1250 03/06/18 2016 03/07/18 0647  WBC 3.7 3.9*  --  5.6  NEUTROABS  --  2.4  --   --   HGB 6.5* 6.0* 9.8* 10.5*  HCT 19.7* 19.3* 29.2* 31.2*  MCV 94 97.0  --  88.1  PLT 196 166  --  153   Blood Culture    Component Value Date/Time   SDES URINE, RANDOM 01/25/2018 2210   SPECREQUEST STERILE CONTAINER 01/25/2018 2210   CULT MULTIPLE SPECIES PRESENT, SUGGEST RECOLLECTION (A) 01/25/2018 2210   REPTSTATUS 01/27/2018 FINAL 01/25/2018 2210    Cardiac Enzymes: No results for input(s): CKTOTAL, CKMB, CKMBINDEX, TROPONINI in the last 168 hours. CBG: No results for input(s): GLUCAP in the last 168 hours. Iron Studies: No results for input(s): IRON, TIBC, TRANSFERRIN, FERRITIN in the last 72 hours. @lablastinr3 @ Studies/Results: No results found. Medications:  . Chlorhexidine Gluconate Cloth  6 each Topical Q0600  . diclofenac sodium  4 g Topical QID  . hydrocortisone cream   Topical BID  . mirtazapine  15 mg Oral QHS  . pantoprazole (PROTONIX) IV  40 mg Intravenous Q12H  . sucroferric oxyhydroxide  500 mg Oral TID WC    Dialysis Orders: MWF at  [facility], 2K/2Ca, EDW 55.5kg. BFR300/DFR 800, 16g needles, AVF, heparin 3K bolus - Mircera 236mcg IV q 2 weeks (last 8/14, Hgb 10.1 on 8/21)  Assessment/Plan: 1.  Melena/ABLA: Had EGD this morning. 2u pRBC given during HD yesterday. Hb improved to 10.5 this AM. Held heparin with HD yesterday.  2.  ESRD: Dialyzes MWF, had HD yesterday.  Uneventful.  Next HD 9/2 if remains hospitalized.  3.  Hypertension/volume: Current BP 122/44.  Home med inc amlodipine 10 - not current on this inpatient.   UF 1.2L yesterday. Post HD wt 55.7kg yesterday,   4.  Metabolic bone disease: Ca ok. Phos pending - typically high. D/c'd calcitriol from med list - does not get this anymore d/t Hx hypercalcemia. Continue Velphoro as binder once cleared to eat.  Jannifer Hick MD [facility] Pager [phone]

## 2018-03-07 NOTE — Progress Notes (Addendum)
Internal Medicine Attending Admission Note Date: 03/07/2018  Patient name: Nancy [name] Medical record number: [id number] Date of birth: 04/13/1941 Age: 77 y.o. Gender: female  I saw and evaluated the patient. I reviewed the resident's note and I agree with the resident's findings and plan as documented in the resident's note.  Chief Complaint(s): Dark stools, generalized weakness, and dizziness 3 days.  History - key components related to admission:  Nancy [name] is a 77 year old woman with essential hypertension, hyperlipidemia, end-stage renal disease requiring hemodialysis, aortic stenosis, and prior cerebrovascular accident who presents with a three-day history of dark stools, generalized weakness, and dizziness.  She was in her usual state of health until 3 days prior to admission when she developed dark tarry stools with blood on the toilet paper and in the water. This was associated with generalized weakness and dizziness. On the morning of admission, the melanic stools resolved and were once again brown. She denied any nausea, vomiting, abdominal pain, shortness of breath, or chest pain. She underwent an esophagogastroduodenoscopy on August 16 which revealed an esophageal web and 2 cm hiatal hernia.  She was seen in the [facility] for these complaints and was found to have a hemoglobin of 6.5 with a baseline of around 10. She was therefore called back for admission to the internal medicine teaching service for further evaluation and care.  Since admission she received 2 units of packed red blood cells during her hemodialysis session.  An EGD was performed this morning and demonstrated a 2 cm hiatal hernia, nonobstructing mild Schatzki's ring, nonbleeding erosive gastropathy, which was biopsied, and erythematous duodenopathy with suggestion of recent bleed. The erythematous duodenopathy was felt to be the potential source for her GI blood loss.  When seen on rounds this morning,  immediately after her esophagogastroduodenoscopy, she was somnolent. When aroused she denied any complaints of abdominal pain.  Physical Exam - key components related to admission:  Vitals:   03/07/18 0940 03/07/18 0945 03/07/18 0950 03/07/18 1040  BP: (!) 118/31 (!) 117/40 (!) 122/44 (!) 100/47  Pulse: 95 93 94 80  Resp: 15 (!) 26 (!) 23 20  Temp:      TempSrc:      SpO2: 98% 97% 98% 100%  Weight:      Height:       Gen.: Well-developed, well-nourished, woman lying comfortably in bed in no acute distress. She was somnolent but arousable. Heart: Regular rate and rhythm with a 3/6 crescendo-decrescendo systolic murmur best heard along the left sternal border, although it was still audible at the apex. Abdomen: Soft, nontender, without guarding or rebound. Extremities: Trace edema.  Lab results:  Basic Metabolic Panel: Recent Labs    03/06/18 1250  NA 140  K 4.0  CL 99  CO2 25  GLUCOSE 93  BUN 68*  CREATININE 10.21*  CALCIUM 8.7*   Liver Function Tests: Recent Labs    03/06/18 1250  AST 12*  ALT 8  ALKPHOS 34*  BILITOT 0.7  PROT 5.6*  ALBUMIN 3.0*   CBC: Recent Labs    03/06/18 1250 03/06/18 2016 03/07/18 0647  WBC 3.9*  --  5.6  NEUTROABS 2.4  --   --   HGB 6.0* 9.8* 10.5*  HCT 19.3* 29.2* 31.2*  MCV 97.0  --  88.1  PLT 166  --  153   Coagulation: Recent Labs    03/06/18 1345  INR 1.03   Misc. Labs:  Fecal occult positive  Assessment & Plan by  Problem:  Nancy [name] is a 77 year old woman with essential hypertension, hyperlipidemia, end-stage renal disease requiring hemodialysis, aortic stenosis, and prior cerebrovascular accident who presents with a three-day history of dark stools, generalized weakness, and dizziness. She was found to have a hemoglobin of 6.5 and erythematous duodenitis with the suggestion of recent bleed on esophagogastroduodenoscopy.  This was felt to be the source of her melanic stools and anemia.  1) Erythematous  duodenoscopy: No endoscopic evidence of current bleeding. She responded well to the 2 units of packed red blood cells during hemodialysis. The biopsies of the gastritis are pending at this time and will be followed up. She will be placed on PPI therapy pending the results of the biopsy. She will be asked to avoid all nonsteroidal anti-inflammatories.  2) Disposition: She will be discharged home today with follow-up in the [facility].  Please note this is an H&P and was inadvertently label his progress note.

## 2018-03-07 NOTE — Discharge Summary (Signed)
Name: Nancy [name] MRN: [id number] DOB: [birth year] 76 y.o. PCP: Aldine Contes, MD  Date of Admission: 03/06/2018 12:04 PM Date of Discharge: 03/07/2018 Attending Physician: Oval Linsey, MD  Discharge Diagnosis: 1. Upper GI Bleed, resolved.  Discharge Medications: Allergies as of 03/07/2018      Reactions   Hydrocodone Nausea And Vomiting, Other (See Comments)   dizziness      Medication List    TAKE these medications   amLODipine 10 MG tablet Commonly known as:  NORVASC Take 1 tablet (10 mg total) by mouth daily.   aspirin 81 MG EC tablet Take 1 tablet (81 mg total) by mouth daily.   calcitRIOL 0.5 MCG capsule Commonly known as:  ROCALTROL Take 4 capsules (2 mcg total) by mouth every Monday, Wednesday, and Friday with hemodialysis.   diclofenac sodium 1 % Gel Commonly known as:  VOLTAREN Apply 4 g topically 4 (four) times daily.   hydrocortisone cream 0.5 % Apply 1 application topically 2 (two) times daily. What changed:    when to take this  reasons to take this   mirtazapine 15 MG tablet Commonly known as:  REMERON Take 1 tablet (15 mg total) by mouth at bedtime.   nitroGLYCERIN 0.3 MG SL tablet Commonly known as:  NITROSTAT Place 1 tablet (0.3 mg total) under the tongue every 5 (five) minutes as needed for chest pain.   pantoprazole 40 MG tablet Commonly known as:  PROTONIX Take 1 tablet (40 mg total) by mouth daily.   promethazine 12.5 MG tablet Commonly known as:  PHENERGAN Take 1 tablet (12.5 mg total) by mouth every 6 (six) hours as needed for nausea or vomiting (Take 1 prior to dialysis). Take 1 tablet half an hour before meal.   VELPHORO 500 MG chewable tablet Generic drug:  sucroferric oxyhydroxide Chew 1,500 mg by mouth 3 (three) times daily with meals.       Disposition and follow-up:   [name] was discharged from [facility] in stable condition.  At the hospital follow up visit please  address:  1.  Upper GI Bleed - Ensure followed up with GI as planned - Ensure no further melanotic stools - Check CBC to confirm stable Hgb  2.  Labs / imaging needed at time of follow-up: CBC  3.  Pending labs/ test needing follow-up: Pathology report from EGD biopsy  Follow-up Appointments: Follow-up Information    Carol Ada, MD. Schedule an appointment as soon as possible for a visit in 3 day(s).   Specialty:  Gastroenterology Why:  Dr Benson Norway would like to see you in his office on Wednesday, 03/11/18, so please call his office on Monday to set up that appointment Contact information: [place], [place] [address] [phone]        Aldine Contes, MD .   Specialty:  Internal Medicine Contact information: [place], [place] [id number] [place] [facility] Course by problem list: 1. Upper GI Bleed: [name] presented to [facility] on Friday, 03/06/18, from her dialysis center secondary to anemia w/ a HgB of 6.0 which is decreased from her baseline of ~10. She was dialyzed by renal yesterday and was transfused with 2 units pRBC w/ improvements of her HgB to 10.5. An EGD was performed by Dr Benson Norway (GI) which revealed the potential source of bleeding to be from an erythematous duodenopathy so he recommends d/c home with  PPI and outpt f/u at his office on Wednesday, 03/11/18. We notified both the pt and the pt's daughter, Nancy [name], regarding the d/c plan and emphasized to them the need for outpt f/u with Dr Benson Norway on 03/11/18 and the usage of the PPI qd. They are both agreeable with the d/c plan and instructions.  Discharge Vitals:   BP (!) 100/47 (BP Location: Right Arm)   Pulse 80   Temp 98.7 F (37.1 C) (Oral)   Resp 20   Ht 5\' 3"  (1.6 m)   Wt 55.7 kg Comment: standing scale  SpO2 100%   BMI 21.75 kg/m   Pertinent Labs, Studies, and Procedures:  CBC Latest Ref Rng & Units 03/07/2018 03/06/2018 03/06/2018   WBC 4.0 - 10.5 K/uL 5.6 - 3.9(L)  Hemoglobin 12.0 - 15.0 g/dL 10.5(L) 9.8(L) 6.0(LL)  Hematocrit 36.0 - 46.0 % 31.2(L) 29.2(L) 19.3(L)  Platelets 150 - 400 K/uL 153 - 166   BMP Latest Ref Rng & Units 03/06/2018 02/20/2018 01/30/2018  Glucose 70 - 99 mg/dL 93 78 104(H)  BUN 8 - 23 mg/dL 68(H) - 20  Creatinine 0.44 - 1.00 mg/dL 10.21(H) - 5.66(H)  BUN/Creat Ratio 12 - 28 - - -  Sodium 135 - 145 mmol/L 140 135 140  Potassium 3.5 - 5.1 mmol/L 4.0 4.5 3.2(L)  Chloride 98 - 111 mmol/L 99 - 96(L)  CO2 22 - 32 mmol/L 25 - 30  Calcium 8.9 - 10.3 mg/dL 8.7(L) - 9.7    EGD with the following findings: - 2cm Hiatal Hernia - Non obstructing mild Schatzki ring in the lower third of the esophagus - 63mm non-bleeding erosive gastropathy at the antrum w/ biopsies taken - Patchy moderate erythematous mucosa to the duodenal bulb w/o active bleeding but is the potential source of bleeding  Discharge Instructions: Discharge Instructions    Call MD for:  difficulty breathing, headache or visual disturbances   Complete by:  As directed    Call MD for:  persistant dizziness or light-headedness   Complete by:  As directed    Call MD for:  temperature >100.4   Complete by:  As directed    Diet - low sodium heart healthy   Complete by:  As directed    Discharge instructions   Complete by:  As directed    Thank you for allowing [place] to care for you  Your symptoms were due to low blood counts from suspected GI Bleed. The GI doctor looked at your stomach and did not find current bleeding, but did see possible sources that may have been bleeding in the past.  Please follow up with GI as scheduled on Wednesday 03/10/2018  Please follow up in the Internal medicine clinic in the next 1 week   Increase activity slowly   Complete by:  As directed      Signed: Pearson Grippe, DO IM PGY-2 Pager: [phone]

## 2018-03-08 ENCOUNTER — Emergency Department (HOSPITAL_COMMUNITY): Payer: Medicare Other

## 2018-03-08 ENCOUNTER — Encounter (HOSPITAL_COMMUNITY): Payer: Self-pay | Admitting: Emergency Medicine

## 2018-03-08 ENCOUNTER — Emergency Department (HOSPITAL_COMMUNITY)
Admission: EM | Admit: 2018-03-08 | Discharge: 2018-03-08 | Disposition: A | Discharge status: Home / Self Care | Payer: Medicare Other | Attending: Emergency Medicine | Admitting: Emergency Medicine

## 2018-03-08 DIAGNOSIS — Y999 Unspecified external cause status: Secondary | ICD-10-CM | POA: Diagnosis not present

## 2018-03-08 DIAGNOSIS — S161XXA Strain of muscle, fascia and tendon at neck level, initial encounter: Secondary | ICD-10-CM | POA: Diagnosis not present

## 2018-03-08 DIAGNOSIS — S6991XA Unspecified injury of right wrist, hand and finger(s), initial encounter: Secondary | ICD-10-CM | POA: Diagnosis not present

## 2018-03-08 DIAGNOSIS — W19XXXA Unspecified fall, initial encounter: Secondary | ICD-10-CM | POA: Diagnosis not present

## 2018-03-08 DIAGNOSIS — M25551 Pain in right hip: Secondary | ICD-10-CM | POA: Diagnosis not present

## 2018-03-08 DIAGNOSIS — I132 Hypertensive heart and chronic kidney disease with heart failure and with stage 5 chronic kidney disease, or end stage renal disease: Secondary | ICD-10-CM | POA: Insufficient documentation

## 2018-03-08 DIAGNOSIS — W01118A Fall on same level from slipping, tripping and stumbling with subsequent striking against other sharp object, initial encounter: Secondary | ICD-10-CM | POA: Diagnosis not present

## 2018-03-08 DIAGNOSIS — N186 End stage renal disease: Secondary | ICD-10-CM | POA: Insufficient documentation

## 2018-03-08 DIAGNOSIS — Y929 Unspecified place or not applicable: Secondary | ICD-10-CM | POA: Diagnosis not present

## 2018-03-08 DIAGNOSIS — S0990XA Unspecified injury of head, initial encounter: Secondary | ICD-10-CM | POA: Diagnosis not present

## 2018-03-08 DIAGNOSIS — S79911A Unspecified injury of right hip, initial encounter: Secondary | ICD-10-CM | POA: Diagnosis not present

## 2018-03-08 DIAGNOSIS — Y939 Activity, unspecified: Secondary | ICD-10-CM | POA: Diagnosis not present

## 2018-03-08 DIAGNOSIS — M542 Cervicalgia: Secondary | ICD-10-CM | POA: Diagnosis not present

## 2018-03-08 DIAGNOSIS — M79641 Pain in right hand: Secondary | ICD-10-CM | POA: Insufficient documentation

## 2018-03-08 DIAGNOSIS — R51 Headache: Secondary | ICD-10-CM | POA: Diagnosis not present

## 2018-03-08 DIAGNOSIS — S0003XA Contusion of scalp, initial encounter: Secondary | ICD-10-CM

## 2018-03-08 DIAGNOSIS — S199XXA Unspecified injury of neck, initial encounter: Secondary | ICD-10-CM | POA: Diagnosis not present

## 2018-03-08 DIAGNOSIS — I10 Essential (primary) hypertension: Secondary | ICD-10-CM | POA: Diagnosis not present

## 2018-03-08 DIAGNOSIS — I509 Heart failure, unspecified: Secondary | ICD-10-CM | POA: Insufficient documentation

## 2018-03-08 DIAGNOSIS — I959 Hypotension, unspecified: Secondary | ICD-10-CM | POA: Diagnosis not present

## 2018-03-08 DIAGNOSIS — S0993XA Unspecified injury of face, initial encounter: Secondary | ICD-10-CM | POA: Diagnosis not present

## 2018-03-08 NOTE — ED Triage Notes (Signed)
BIB EMS from home, pt fell while getting out of car, tripped over a curb. Pt reports R leg pain, hematoma noted to R forehead area. NO thinners.

## 2018-03-08 NOTE — Discharge Instructions (Signed)
You were seen in the Emergency Department (ED) today for a head injury.  Based on your evaluation, you may have sustained a concussion (or bruise) to your brain.  If you had a CT scan done, it did not show any evidence of serious injury or bleeding.    Symptoms to expect from a concussion include nausea, mild to moderate headache, difficulty concentrating or sleeping, and mild lightheadedness.  These symptoms should improve over the next few days to weeks, but it may take many weeks before you feel back to normal.  Return to the emergency department or follow-up with your primary care doctor if your symptoms are not improving over this time.  Signs of a more serious head injury include vomiting, severe headache, excessive sleepiness or confusion, and weakness or numbness in your face, arms or legs.  Return immediately to the Emergency Department if you experience any of these more concerning symptoms.    Rest, avoid strenuous physical or mental activity, and avoid activities that could potentially result in another head injury until all your symptoms from this head injury are completely resolved for at least 2-3 weeks.  You may take ibuprofen or acetaminophen over the counter according to label instructions for mild headache or scalp soreness.  

## 2018-03-08 NOTE — ED Provider Notes (Signed)
Emergency Department Provider Note   I have reviewed the triage vital signs and the nursing notes.   HISTORY  Chief Complaint Fall  HPI Nancy [name] is a 77 y.o. female with PMH of AS, CHF, CVA, ESRD, HTN presents to the emergency department for evaluation after mechanical fall.  The patient states she was getting out of the car going to church when she tripped and fell forward.  She tried to catch herself but was unable to and her forehead struck the concrete.  She has small amount of bleeding but no loss of consciousness.  No confusion or vomiting since the fall.  Complain of some mild right hand pain as well as right hip pain.  Denies any chest pain, shortness of breath, heart palpitations prior to falling.  No sudden lightheadedness.  No vision changes.  No radiation of symptoms or modifying factors.  Past Medical History:  Diagnosis Date  . Anemia   . Aortic stenosis   . Bacterial sinusitis 09/17/2011  . CHF (congestive heart failure) ([place])   . CKD (chronic kidney disease) stage 4, GFR 15-29 ml/min ([place]) 08/11/2006   Cr continues to increase. Proteinuria on UA 02/10/12.    . Colitis   . CVA (cerebrovascular accident) [facility])    New hemorrhagic per CT scan '09  . Diverticulosis of colon   . Dysfunctional uterine bleeding   . ESRD (end stage renal disease) on dialysis ([place])    "MWF; [facility]" (11/27/2017)  . Fecal impaction ([place])   . Headache(784.0)   . Heart murmur   . HERNIORRHAPHY, HX OF 08/11/2006  . Hypertension   . OA (osteoarthritis)    bilateral knees  . Postmenopausal   . Pulmonary nodule   . TINEA CRURIS 01/12/2007    Patient Active Problem List   Diagnosis Date Noted  . Acute gastritis without hemorrhage   . Duodenitis with bleeding   . ESRD (end stage renal disease) ([place])   . Pityriasis rosea 03/06/2018  . Acute on chronic blood loss anemia 03/06/2018  . Protein-calorie malnutrition, severe 01/12/2018  . Midline thoracic back pain 12/23/2017  .  Symptomatic anemia 02/17/2017  . Secondary hyperparathyroidism of renal origin ([place]) 01/15/2017  . Mitral valve annular calcification   . Dizziness 12/08/2016  . Angina at rest [facility])   . Goals of care, counseling/discussion   . Aortic stenosis 05/21/2016  . Anemia associated with chronic renal failure 02/01/2016  . Atherosclerosis of aorta ([place]) 01/11/2015  . End stage renal disease ([place]) 02/04/2013  . Non-intractable vomiting with nausea 08/17/2012  . Glaucoma 03/18/2012  . Health care maintenance 09/17/2011  . Osteoarthrosis involving lower leg 10/31/2008  . History of CVA (cerebrovascular accident) 01/28/2008  . Hyperlipidemia 02/13/2007  . PULMONARY NODULES 01/12/2007  . Left ventricular hypertrophy 09/02/2006  . Essential hypertension 08/11/2006  . GERD 08/11/2006    Past Surgical History:  Procedure Laterality Date  . ABDOMINAL HYSTERECTOMY    . AV FISTULA PLACEMENT Left 02/19/2017   Procedure: CREATION OF LEFT ARM BRACHIOCEPHALIC ARTERIOVENOUS (AV) FISTULA;  Surgeon: Rosetta Posner, MD;  Location: [place];  Service: Vascular;  Laterality: Left;  . BASCILIC VEIN TRANSPOSITION Left 04/23/2017   Procedure: BASILIC VEIN TRANSPOSITION SECOND STAGE;  Surgeon: Rosetta Posner, MD;  Location: [place];  Service: Vascular;  Laterality: Left;  . BIOPSY  02/20/2018   Procedure: BIOPSY;  Surgeon: Carol Ada, MD;  Location: [facility] END[facility];  Service: Endoscopy;;  . BIOPSY  03/07/2018   Procedure: BIOPSY;  Surgeon: Carol Ada, MD;  Location: [facility] END[facility]COPY;  Service: Endoscopy;;  . CHOLECYSTECTOMY  2009  . COLONOSCOPY    . ESOPHAGOGASTRODUODENOSCOPY N/A 03/07/2018   Procedure: ESOPHAGOGASTRODUODENOSCOPY (EGD);  Surgeon: Carol Ada, MD;  Location: [place];  Service: Endoscopy;  Laterality: N/A;  . ESOPHAGOGASTRODUODENOSCOPY (EGD) WITH PROPOFOL N/A 02/20/2018   Procedure: ESOPHAGOGASTRODUODENOSCOPY (EGD) WITH PROPOFOL;  Surgeon: Carol Ada, MD;  Location: [facility];  Service:  Endoscopy;  Laterality: N/A;  . INGUINAL HERNIA REPAIR  2008  . INSERTION OF DIALYSIS CATHETER Right 02/19/2017   Procedure: INSERTION OF TUNNELED DIALYSIS CATHETER - RIGHT INTERNAL JUGULAR PLACEMENT;  Surgeon: Rosetta Posner, MD;  Location: [place];  Service: Vascular;  Laterality: Right;  . IRIDOTOMY / IRIDECTOMY     Laser, right eye 12/26/11 left eye 01/24/12  . MASS EXCISION Left 05/07/2013   Procedure: EXCISION CYST;  Surgeon: Myrtha Mantis., MD;  Location: [place];  Service: Ophthalmology;  Laterality: Left;  . SAVORY DILATION N/A 02/20/2018   Procedure: SAVORY DILATION;  Surgeon: Carol Ada, MD;  Location: [facility];  Service: Endoscopy;  Laterality: N/A;   Allergies Hydrocodone  Family History  Problem Relation Age of Onset  . Hypertension Mother   . Congestive Heart Failure Mother   . Heart attack Brother 39    Social History Social History   Tobacco Use  . Smoking status: Never Smoker  . Smokeless tobacco: Never Used  Substance Use Topics  . Alcohol use: No    Alcohol/week: 0.0 standard drinks  . Drug use: No    Comment: 08/15/08 UDS + cocaine    Review of Systems  Constitutional: No fever/chills Eyes: No visual changes. ENT: No sore throat. Cardiovascular: Denies chest pain. Respiratory: Denies shortness of breath. Gastrointestinal: No abdominal pain.  No nausea, no vomiting.  No diarrhea.  No constipation. Genitourinary: Negative for dysuria. Musculoskeletal: Negative for back pain. Positive right hip and right hand pain.  Skin: Negative for rash. Neurological: Negative for focal weakness or numbness. Positive HA.   10-point ROS otherwise negative.  ____________________________________________   PHYSICAL EXAM:  VITAL SIGNS: Vitals:   03/08/18 1530 03/08/18 1630  BP: (!) 161/71 (!) 149/69  Pulse: 94 96  Resp: 18 18  SpO2: 100% 99%    Constitutional: Alert and oriented. Well appearing and in no acute distress. Eyes:  Conjunctivae are normal. PERRL. EOMI.  Head: Hematoma over the right forehead. No laceration. Faint abrasion. No active bleeding.  Nose: No congestion/rhinnorhea. Mouth/Throat: Mucous membranes are moist. Neck: No stridor. No cervical spine tenderness to palpation. Cardiovascular: Normal rate, regular rhythm. Good peripheral circulation. Grossly normal heart sounds.   Respiratory: Normal respiratory effort.  No retractions. Lungs CTAB. Gastrointestinal: Soft and nontender. No distention.  Musculoskeletal: Mild pain with passive ROM of the right hip. Mild pain to palpation over the right lateral palm.  Neurologic:  Normal speech and language. No gross focal neurologic deficits are appreciated.  Skin:  Skin is warm, dry and intact. No rash noted.  ____________________________________________  RADIOLOGY  Ct Head Wo Contrast  Result Date: 03/08/2018 CLINICAL DATA:  Patient fell today striking head and has a knot on her forehead. Headache and neck pain. EXAM: CT HEAD WITHOUT CONTRAST CT MAXILLOFACIAL WITHOUT CONTRAST CT CERVICAL SPINE WITHOUT CONTRAST TECHNIQUE: Multidetector CT imaging of the head, cervical spine, and maxillofacial structures were performed using the standard protocol without intravenous contrast. Multiplanar CT image reconstructions of the cervical spine and maxillofacial structures were also generated. COMPARISON:  02/17/2017  head CT. FINDINGS: CT HEAD FINDINGS Brain: Chronic small left inferior cerebellar infarct with encephalomalacia. Chronic small vessel ischemic disease of periventricular white matter with stable involutional changes of the brain. Midline fourth ventricle and basal cisterns without effacement. No intra-axial mass nor extra-axial fluid collections. Chronic appearing small left thalamic lacunar infarct. Vascular: Moderate atherosclerotic calcifications of the right vertebral and both cavernous sinus carotids. Skull: No acute skull fracture or suspicious osseous  lesions. Other: Mild-to-moderate soft tissue swelling with small right frontal scalp hematoma. CT MAXILLOFACIAL FINDINGS Exam is somewhat mid limited by motion artifacts despite repeat reminders to the patient to remain still during imaging. Osseous: No acute maxillofacial fracture is apparent. The orbits, paranasal sinuses and zygomaticomaxillary complexes appear intact. The nasal bone, maxilla and anterior process of the maxilla appear intact. No mandibular dislocation or fracture is identified. Temporomandibular joints are well seated bilaterally. The pterygoid plates are unremarkable. Orbits: Intact orbits. No retrobulbar hemorrhage or hematoma. Extraocular muscles and optic nerves appear symmetric bilaterally. Sinuses: Negative Soft tissues: Right forehead contusion better visualized on the CT head exam. CT CERVICAL SPINE FINDINGS Slightly limited by motion artifacts at the C1 and C2 levels. Alignment: Maintained cervical lordosis. Intact atlantodental interval. Skull base and vertebrae: No acute fracture. No primary bone lesion or focal pathologic process. Soft tissues and spinal canal: No prevertebral fluid or swelling. No visible canal hematoma. Disc levels: Degenerative disc flattening C3-4 and C6-7 with small calcified disc at C6-7. No jumped or perched facets. Upper chest: No pneumothorax.  Clear lung apices. Other: None IMPRESSION: 1. Right frontal scalp contusion and small scalp hematoma. No underlying fracture. 2. Atrophy with chronic small vessel ischemia. No acute intracranial abnormality. 3. Remote left cerebellar infarct with encephalomalacia. Likely chronic left lacunar infarct. 4. No acute maxillofacial fracture given slight limitations due to motion. 5. No acute cervical spine fracture also given slight limitations due to motion. Degenerative disc disease at C3-4 and C6-7. Electronically Signed   By: Ashley Royalty M.D.   On: 03/08/2018 18:18   Ct Cervical Spine Wo Contrast  Result Date:  03/08/2018 CLINICAL DATA:  Patient fell today striking head and has a knot on her forehead. Headache and neck pain. EXAM: CT HEAD WITHOUT CONTRAST CT MAXILLOFACIAL WITHOUT CONTRAST CT CERVICAL SPINE WITHOUT CONTRAST TECHNIQUE: Multidetector CT imaging of the head, cervical spine, and maxillofacial structures were performed using the standard protocol without intravenous contrast. Multiplanar CT image reconstructions of the cervical spine and maxillofacial structures were also generated. COMPARISON:  02/17/2017 head CT. FINDINGS: CT HEAD FINDINGS Brain: Chronic small left inferior cerebellar infarct with encephalomalacia. Chronic small vessel ischemic disease of periventricular white matter with stable involutional changes of the brain. Midline fourth ventricle and basal cisterns without effacement. No intra-axial mass nor extra-axial fluid collections. Chronic appearing small left thalamic lacunar infarct. Vascular: Moderate atherosclerotic calcifications of the right vertebral and both cavernous sinus carotids. Skull: No acute skull fracture or suspicious osseous lesions. Other: Mild-to-moderate soft tissue swelling with small right frontal scalp hematoma. CT MAXILLOFACIAL FINDINGS Exam is somewhat mid limited by motion artifacts despite repeat reminders to the patient to remain still during imaging. Osseous: No acute maxillofacial fracture is apparent. The orbits, paranasal sinuses and zygomaticomaxillary complexes appear intact. The nasal bone, maxilla and anterior process of the maxilla appear intact. No mandibular dislocation or fracture is identified. Temporomandibular joints are well seated bilaterally. The pterygoid plates are unremarkable. Orbits: Intact orbits. No retrobulbar hemorrhage or hematoma. Extraocular muscles and optic nerves appear symmetric bilaterally. Sinuses:  Negative Soft tissues: Right forehead contusion better visualized on the CT head exam. CT CERVICAL SPINE FINDINGS Slightly limited by  motion artifacts at the C1 and C2 levels. Alignment: Maintained cervical lordosis. Intact atlantodental interval. Skull base and vertebrae: No acute fracture. No primary bone lesion or focal pathologic process. Soft tissues and spinal canal: No prevertebral fluid or swelling. No visible canal hematoma. Disc levels: Degenerative disc flattening C3-4 and C6-7 with small calcified disc at C6-7. No jumped or perched facets. Upper chest: No pneumothorax.  Clear lung apices. Other: None IMPRESSION: 1. Right frontal scalp contusion and small scalp hematoma. No underlying fracture. 2. Atrophy with chronic small vessel ischemia. No acute intracranial abnormality. 3. Remote left cerebellar infarct with encephalomalacia. Likely chronic left lacunar infarct. 4. No acute maxillofacial fracture given slight limitations due to motion. 5. No acute cervical spine fracture also given slight limitations due to motion. Degenerative disc disease at C3-4 and C6-7. Electronically Signed   By: Ashley Royalty M.D.   On: 03/08/2018 18:18   Dg Hand Complete Right  Result Date: 03/08/2018 CLINICAL DATA:  77 year old female with history of trauma from a fall while getting out of a car. Pain in the right hand. EXAM: RIGHT HAND - COMPLETE 3+ VIEW COMPARISON:  None. FINDINGS: There is no evidence of fracture or dislocation. Mild multifocal degenerative changes of osteoarthritis. Soft tissues are unremarkable. IMPRESSION: 1. No acute radiographic abnormality of the right hand. Electronically Signed   By: Vinnie Langton M.D.   On: 03/08/2018 17:19   Dg Hip Unilat W Or Wo Pelvis 2-3 Views Right  Result Date: 03/08/2018 CLINICAL DATA:  77 year old female with history of trauma from a fall complaining of right leg and hip pain. EXAM: DG HIP (WITH OR WITHOUT PELVIS) 2-3V RIGHT COMPARISON:  No priors. FINDINGS: There is no evidence of hip fracture or dislocation. There is no evidence of arthropathy or other focal bone abnormality. Calcification in  the central pelvis, corresponding to calcified fibroid noted on prior CT examination. IMPRESSION: 1. No acute radiographic abnormality of the bony pelvis or right hip. Electronically Signed   By: Vinnie Langton M.D.   On: 03/08/2018 17:21   Ct Maxillofacial Wo Contrast  Result Date: 03/08/2018 CLINICAL DATA:  Patient fell today striking head and has a knot on her forehead. Headache and neck pain. EXAM: CT HEAD WITHOUT CONTRAST CT MAXILLOFACIAL WITHOUT CONTRAST CT CERVICAL SPINE WITHOUT CONTRAST TECHNIQUE: Multidetector CT imaging of the head, cervical spine, and maxillofacial structures were performed using the standard protocol without intravenous contrast. Multiplanar CT image reconstructions of the cervical spine and maxillofacial structures were also generated. COMPARISON:  02/17/2017 head CT. FINDINGS: CT HEAD FINDINGS Brain: Chronic small left inferior cerebellar infarct with encephalomalacia. Chronic small vessel ischemic disease of periventricular white matter with stable involutional changes of the brain. Midline fourth ventricle and basal cisterns without effacement. No intra-axial mass nor extra-axial fluid collections. Chronic appearing small left thalamic lacunar infarct. Vascular: Moderate atherosclerotic calcifications of the right vertebral and both cavernous sinus carotids. Skull: No acute skull fracture or suspicious osseous lesions. Other: Mild-to-moderate soft tissue swelling with small right frontal scalp hematoma. CT MAXILLOFACIAL FINDINGS Exam is somewhat mid limited by motion artifacts despite repeat reminders to the patient to remain still during imaging. Osseous: No acute maxillofacial fracture is apparent. The orbits, paranasal sinuses and zygomaticomaxillary complexes appear intact. The nasal bone, maxilla and anterior process of the maxilla appear intact. No mandibular dislocation or fracture is identified. Temporomandibular joints are well  seated bilaterally. The pterygoid plates  are unremarkable. Orbits: Intact orbits. No retrobulbar hemorrhage or hematoma. Extraocular muscles and optic nerves appear symmetric bilaterally. Sinuses: Negative Soft tissues: Right forehead contusion better visualized on the CT head exam. CT CERVICAL SPINE FINDINGS Slightly limited by motion artifacts at the C1 and C2 levels. Alignment: Maintained cervical lordosis. Intact atlantodental interval. Skull base and vertebrae: No acute fracture. No primary bone lesion or focal pathologic process. Soft tissues and spinal canal: No prevertebral fluid or swelling. No visible canal hematoma. Disc levels: Degenerative disc flattening C3-4 and C6-7 with small calcified disc at C6-7. No jumped or perched facets. Upper chest: No pneumothorax.  Clear lung apices. Other: None IMPRESSION: 1. Right frontal scalp contusion and small scalp hematoma. No underlying fracture. 2. Atrophy with chronic small vessel ischemia. No acute intracranial abnormality. 3. Remote left cerebellar infarct with encephalomalacia. Likely chronic left lacunar infarct. 4. No acute maxillofacial fracture given slight limitations due to motion. 5. No acute cervical spine fracture also given slight limitations due to motion. Degenerative disc disease at C3-4 and C6-7. Electronically Signed   By: Ashley Royalty M.D.   On: 03/08/2018 18:18    ____________________________________________   PROCEDURES  Procedure(s) performed:   Procedures  None ____________________________________________   INITIAL IMPRESSION / ASSESSMENT AND PLAN / ED COURSE  Pertinent labs & imaging results that were available during my care of the patient were reviewed by me and considered in my medical decision making (see chart for details).  Patient presents to the emergency department for evaluation of mechanical fall with head injury.  She attempted to catch herself but did strike the concrete with her forehead.  She has no wrist tenderness.  Normal range of motion of  the wrist with no scaphoid tenderness.  She does have some mild pain over the lateral palm on the right as well as mild pain with passive range of motion of the right hip.  She has a hematoma over the right forehead with some mild periorbital swelling.  No evidence of entrapment.  No subjective vision changes.  Plan for CT imaging of the head, face, cervical spine as well as plain films of the other painful areas.  Patient is on 81 mg aspirin.   CT imaging and plain films are negative. Patient feeling well. Will discharge.   At this time, I do not feel there is any life-threatening condition present. I have reviewed and discussed all results (EKG, imaging, lab, urine as appropriate), exam findings with patient. I have reviewed nursing notes and appropriate previous records.  I feel the patient is safe to be discharged home without further emergent workup. Discussed usual and customary return precautions. Patient and family (if present) verbalize understanding and are comfortable with this plan.  Patient will follow-up with their primary care provider. If they do not have a primary care provider, information for follow-up has been provided to them. All questions have been answered.  ____________________________________________  FINAL CLINICAL IMPRESSION(S) / ED DIAGNOSES  Final diagnoses:  Injury of head, initial encounter  Contusion of scalp, initial encounter  Fall, initial encounter  Strain of neck muscle, initial encounter  Right hand pain  Right hip pain    Note:  This document was prepared using Dragon voice recognition software and may include unintentional dictation errors.  Nanda Quinton, MD Emergency Medicine    Abdalrahman Clementson, [facility], MD 03/08/18 [phone]

## 2018-03-08 NOTE — ED Notes (Signed)
Returned from ct scan 

## 2018-03-08 NOTE — ED Notes (Signed)
Pt seen walking through Pod A. Per Network engineer pt is up for DC but pt refuses to wait for her paperwork, she states it is taking too long. Pt seen leaving ED with steady gait.

## 2018-03-08 NOTE — ED Notes (Signed)
Patient transported to X-ray 

## 2018-03-09 DIAGNOSIS — D631 Anemia in chronic kidney disease: Secondary | ICD-10-CM | POA: Diagnosis not present

## 2018-03-09 DIAGNOSIS — N186 End stage renal disease: Secondary | ICD-10-CM | POA: Diagnosis not present

## 2018-03-09 DIAGNOSIS — D689 Coagulation defect, unspecified: Secondary | ICD-10-CM | POA: Diagnosis not present

## 2018-03-09 DIAGNOSIS — N2581 Secondary hyperparathyroidism of renal origin: Secondary | ICD-10-CM | POA: Diagnosis not present

## 2018-03-09 DIAGNOSIS — R51 Headache: Secondary | ICD-10-CM | POA: Diagnosis not present

## 2018-03-09 DIAGNOSIS — D509 Iron deficiency anemia, unspecified: Secondary | ICD-10-CM | POA: Diagnosis not present

## 2018-03-09 DIAGNOSIS — R079 Chest pain, unspecified: Secondary | ICD-10-CM | POA: Diagnosis not present

## 2018-03-09 NOTE — Progress Notes (Signed)
Internal Medicine Clinic Attending  I saw and evaluated the patient.  I personally confirmed the key portions of the history and exam documented by Dr. Chundi and I reviewed pertinent patient test results.  The assessment, diagnosis, and plan were formulated together and I agree with the documentation in the resident's note. 

## 2018-03-10 ENCOUNTER — Other Ambulatory Visit: Payer: Self-pay

## 2018-03-10 NOTE — Patient Outreach (Signed)
[facility]) Care Management  03/10/2018  [place] B Laughery [birth year] [id number]    77 year old with history of ESRD/HD, heart failure, stroke. Per chart noted-9 admissions in 6 months and 1 ED visit. Most recent admission 8/30-8/31 with Upper GI bleed; symptomatic anemia and presented to the ED on 03/08/18 following a fall and head injury.   7/23-7/28 observation with chest pain; nausea vomiting; volume overload  01/26/18 observation stay with UTI 7/7-7/9 viral gastroenteritis/hypokalemia/diarrhea/ESRD. 6/20-6/25observationwithESRD/intractable nausea vomiting 6/11-6/14 observation with refractory nausea/vomiting 5/23-5/24 observation with nausea/vomiting/ESRD 4/25-4/30 uremic encephalopathy 4/19-4/23 diverticulitis/ESRD/intractable nausea and vomiting.   RNCM called to follow up. No answer. HIPPA compliant message left.  Plan: outreach letter sent. RNCM will follow up within 3-4 business days.  Thea Silversmith, RN, MSN, [place] Coordinator Cell: [phone]

## 2018-03-11 DIAGNOSIS — D631 Anemia in chronic kidney disease: Secondary | ICD-10-CM | POA: Diagnosis not present

## 2018-03-11 DIAGNOSIS — D689 Coagulation defect, unspecified: Secondary | ICD-10-CM | POA: Diagnosis not present

## 2018-03-11 DIAGNOSIS — R079 Chest pain, unspecified: Secondary | ICD-10-CM | POA: Diagnosis not present

## 2018-03-11 DIAGNOSIS — N2581 Secondary hyperparathyroidism of renal origin: Secondary | ICD-10-CM | POA: Diagnosis not present

## 2018-03-11 DIAGNOSIS — N186 End stage renal disease: Secondary | ICD-10-CM | POA: Diagnosis not present

## 2018-03-11 DIAGNOSIS — R51 Headache: Secondary | ICD-10-CM | POA: Diagnosis not present

## 2018-03-11 DIAGNOSIS — D509 Iron deficiency anemia, unspecified: Secondary | ICD-10-CM | POA: Diagnosis not present

## 2018-03-12 ENCOUNTER — Other Ambulatory Visit: Payer: Self-pay

## 2018-03-12 NOTE — Patient Outreach (Signed)
[facility]) Care Management  03/12/2018  [place] B Harrower Sep 30, 1940 [id number]   77 year old with history of ESRD/HD, heart failure, stroke. Per chart noted-9 admissions in 6 months and 1 ED visit. Most recent admission 8/30-8/31 with Upper GI bleed; symptomatic anemia and presented to the ED on 03/08/18 following a fall and head injury.   7/23-7/28 observation with chest pain; nausea vomiting; volume overload  01/26/18 observation stay with UTI 7/7-7/9 viral gastroenteritis/hypokalemia/diarrhea/ESRD. 6/20-6/25observationwithESRD/intractable nausea vomiting 6/11-6/14 observation with refractory nausea/vomiting 5/23-5/24 observation with nausea/vomiting/ESRD 4/25-4/30 uremic encephalopathy 4/19-4/23 diverticulitis/ESRD/intractable nausea and vomiting.  RNCM called to follow up. No answer/voice message not set up. unsucessful outreach letter sent 03/10/18.  Plan: Attempt to call client within the next 3-4 business days.  Thea Silversmith, RN, MSN, [place] Coordinator Cell: [phone]

## 2018-03-13 ENCOUNTER — Emergency Department (HOSPITAL_COMMUNITY)
Admission: EM | Admit: 2018-03-13 | Discharge: 2018-03-13 | Discharge status: Against Medical Advice | Payer: Medicare Other | Attending: Emergency Medicine | Admitting: Emergency Medicine

## 2018-03-13 ENCOUNTER — Other Ambulatory Visit: Payer: Self-pay

## 2018-03-13 ENCOUNTER — Encounter (HOSPITAL_COMMUNITY): Payer: Self-pay | Admitting: Emergency Medicine

## 2018-03-13 ENCOUNTER — Emergency Department (HOSPITAL_COMMUNITY): Payer: Medicare Other

## 2018-03-13 DIAGNOSIS — R079 Chest pain, unspecified: Secondary | ICD-10-CM | POA: Insufficient documentation

## 2018-03-13 DIAGNOSIS — R51 Headache: Secondary | ICD-10-CM | POA: Diagnosis not present

## 2018-03-13 DIAGNOSIS — N186 End stage renal disease: Secondary | ICD-10-CM | POA: Diagnosis not present

## 2018-03-13 DIAGNOSIS — Z5320 Procedure and treatment not carried out because of patient's decision for unspecified reasons: Secondary | ICD-10-CM | POA: Diagnosis not present

## 2018-03-13 DIAGNOSIS — R05 Cough: Secondary | ICD-10-CM | POA: Diagnosis not present

## 2018-03-13 DIAGNOSIS — D689 Coagulation defect, unspecified: Secondary | ICD-10-CM | POA: Diagnosis not present

## 2018-03-13 DIAGNOSIS — I447 Left bundle-branch block, unspecified: Secondary | ICD-10-CM | POA: Diagnosis not present

## 2018-03-13 DIAGNOSIS — R0789 Other chest pain: Secondary | ICD-10-CM | POA: Diagnosis not present

## 2018-03-13 DIAGNOSIS — D509 Iron deficiency anemia, unspecified: Secondary | ICD-10-CM | POA: Diagnosis not present

## 2018-03-13 DIAGNOSIS — D631 Anemia in chronic kidney disease: Secondary | ICD-10-CM | POA: Diagnosis not present

## 2018-03-13 DIAGNOSIS — Z5329 Procedure and treatment not carried out because of patient's decision for other reasons: Secondary | ICD-10-CM

## 2018-03-13 DIAGNOSIS — I959 Hypotension, unspecified: Secondary | ICD-10-CM | POA: Diagnosis not present

## 2018-03-13 DIAGNOSIS — N2581 Secondary hyperparathyroidism of renal origin: Secondary | ICD-10-CM | POA: Diagnosis not present

## 2018-03-13 DIAGNOSIS — I499 Cardiac arrhythmia, unspecified: Secondary | ICD-10-CM | POA: Diagnosis not present

## 2018-03-13 LAB — CBC
HCT: 27 % — ABNORMAL LOW (ref 36.0–46.0)
HEMOGLOBIN: 8.2 g/dL — AB (ref 12.0–15.0)
MCH: 29 pg (ref 26.0–34.0)
MCHC: 30.4 g/dL (ref 30.0–36.0)
MCV: 95.4 fL (ref 78.0–100.0)
PLATELETS: 154 10*3/uL (ref 150–400)
RBC: 2.83 MIL/uL — ABNORMAL LOW (ref 3.87–5.11)
RDW: 14.9 % (ref 11.5–15.5)
WBC: 5.9 10*3/uL (ref 4.0–10.5)

## 2018-03-13 LAB — I-STAT TROPONIN, ED: TROPONIN I, POC: 0 ng/mL (ref 0.00–0.08)

## 2018-03-13 LAB — BASIC METABOLIC PANEL
Anion gap: 9 (ref 5–15)
BUN: 20 mg/dL (ref 8–23)
CALCIUM: 8 mg/dL — AB (ref 8.9–10.3)
CO2: 33 mmol/L — AB (ref 22–32)
CREATININE: 4.34 mg/dL — AB (ref 0.44–1.00)
Chloride: 97 mmol/L — ABNORMAL LOW (ref 98–111)
GFR calc Af Amer: 11 mL/min — ABNORMAL LOW (ref >60)
GFR calc non Af Amer: 9 mL/min — ABNORMAL LOW (ref >60)
GLUCOSE: 88 mg/dL (ref 70–99)
Potassium: 3 mmol/L — ABNORMAL LOW (ref 3.5–5.1)
Sodium: 139 mmol/L (ref 135–145)

## 2018-03-13 NOTE — ED Triage Notes (Signed)
Per EMS:  Pt presents from dialysis where she had sudden onset of CP during dialysis treatment (about half way through).  Pt has had 2 SL nitro.  EKG showed PACs.  Patient has non-productive cough.  Patient states pain worse with cough.  No radiation.  Some SOB with exertion, denies n/v, denies light-headedness or dizziness.

## 2018-03-13 NOTE — ED Notes (Addendum)
2x calling pt to come back to room. No response.

## 2018-03-13 NOTE — ED Provider Notes (Signed)
Patient placed in Quick Look pathway, seen and evaluated   Chief Complaint: Chest pain  HPI:   Patient coming from dialysis, has left sided chest pain. Does not radiate or move.  New onset shortness of breath with walking.  No nausea or vomiting. Was unable to finish dialysis.   ROS: Cough (not new)  Physical Exam:   Gen: No distress  Neuro: Awake and Alert  Skin: Warm    Focused Exam: No dyspnea at rest.    Initiation of care has begun. The patient has been counseled on the process, plan, and necessity for staying for the completion/evaluation, and the remainder of the medical screening examination    Nancy [name] 03/13/18 1413    Quintella Reichert, MD 03/13/18 1723

## 2018-03-13 NOTE — ED Notes (Signed)
Called for patient x1 

## 2018-03-13 NOTE — ED Notes (Signed)
Patient did not answer when called x 2.  Went to look for patient in A-hall, patient not here.  Will d/c.

## 2018-03-16 ENCOUNTER — Other Ambulatory Visit: Payer: Self-pay

## 2018-03-16 DIAGNOSIS — D509 Iron deficiency anemia, unspecified: Secondary | ICD-10-CM | POA: Diagnosis not present

## 2018-03-16 DIAGNOSIS — N2581 Secondary hyperparathyroidism of renal origin: Secondary | ICD-10-CM | POA: Diagnosis not present

## 2018-03-16 DIAGNOSIS — R51 Headache: Secondary | ICD-10-CM | POA: Diagnosis not present

## 2018-03-16 DIAGNOSIS — D689 Coagulation defect, unspecified: Secondary | ICD-10-CM | POA: Diagnosis not present

## 2018-03-16 DIAGNOSIS — N186 End stage renal disease: Secondary | ICD-10-CM | POA: Diagnosis not present

## 2018-03-16 DIAGNOSIS — R079 Chest pain, unspecified: Secondary | ICD-10-CM | POA: Diagnosis not present

## 2018-03-16 DIAGNOSIS — D631 Anemia in chronic kidney disease: Secondary | ICD-10-CM | POA: Diagnosis not present

## 2018-03-16 NOTE — Patient Outreach (Signed)
[facility]) Care Management  03/16/2018  Nancy [name] 1940/08/15 [id number]   77 year old with history of ESRD/HD, heart failure, stroke. Per chart noted-9admissions in 6 months and 2 ED visits in 6 months.   9/6/19presented to the ED from Dialysis following onset of chest pain during dialysis(per chart, client left AMA). 9/1 presented to ED following a fall/head injury. 8/30-8/31 admitted with Upper GI bleed/symptomatic anemia. 7/23-7/28 observation with chest pain; nausea vomiting; volume overload  01/26/18 observation stay with UTI. 7/7-7/9 viral gastroenteritis/hypokalemia/diarrhea/ESRD. 6/20-6/25observationwithESRD/intractable nausea vomiting. 6/11-6/14 observation with refractory nausea/vomiting. 5/23-5/24 observation with nausea/vomiting/ESRD. 4/25-4/30 uremic encephalopathy. 4/19-4/23 diverticulitis/ESRD/intractable nausea and vomiting.  RNCM called to follow up. Client has had numerous admissions and recent ED visit. No answer. HIPPA compliant message left.  Plan: attempt to reach patient within the next 3-4 days.  Thea Silversmith, RN, MSN, [place] Coordinator Cell: [phone]

## 2018-03-17 DIAGNOSIS — H40029 Open angle with borderline findings, high risk, unspecified eye: Secondary | ICD-10-CM | POA: Diagnosis not present

## 2018-03-17 DIAGNOSIS — H2513 Age-related nuclear cataract, bilateral: Secondary | ICD-10-CM | POA: Diagnosis not present

## 2018-03-17 DIAGNOSIS — H25013 Cortical age-related cataract, bilateral: Secondary | ICD-10-CM | POA: Diagnosis not present

## 2018-03-17 DIAGNOSIS — H4321 Crystalline deposits in vitreous body, right eye: Secondary | ICD-10-CM | POA: Diagnosis not present

## 2018-03-17 DIAGNOSIS — H3589 Other specified retinal disorders: Secondary | ICD-10-CM | POA: Diagnosis not present

## 2018-03-17 DIAGNOSIS — H40023 Open angle with borderline findings, high risk, bilateral: Secondary | ICD-10-CM | POA: Diagnosis not present

## 2018-03-18 DIAGNOSIS — N2581 Secondary hyperparathyroidism of renal origin: Secondary | ICD-10-CM | POA: Diagnosis not present

## 2018-03-18 DIAGNOSIS — D509 Iron deficiency anemia, unspecified: Secondary | ICD-10-CM | POA: Diagnosis not present

## 2018-03-18 DIAGNOSIS — R079 Chest pain, unspecified: Secondary | ICD-10-CM | POA: Diagnosis not present

## 2018-03-18 DIAGNOSIS — R51 Headache: Secondary | ICD-10-CM | POA: Diagnosis not present

## 2018-03-18 DIAGNOSIS — D631 Anemia in chronic kidney disease: Secondary | ICD-10-CM | POA: Diagnosis not present

## 2018-03-18 DIAGNOSIS — N186 End stage renal disease: Secondary | ICD-10-CM | POA: Diagnosis not present

## 2018-03-18 DIAGNOSIS — D689 Coagulation defect, unspecified: Secondary | ICD-10-CM | POA: Diagnosis not present

## 2018-03-19 ENCOUNTER — Ambulatory Visit: Payer: Self-pay

## 2018-03-19 ENCOUNTER — Other Ambulatory Visit: Payer: Self-pay

## 2018-03-19 NOTE — Patient Outreach (Signed)
[facility]) Care Management  03/19/2018  [place] B Buskey 09/05/1940 [id number]  Subjective: "I am doing pretty good".  Assessment: 77 year old with history of ESRD/HD, heart failure, stroke. Per chart noted-9admissions in 6 months and 2 ED visits in 6 months. Client has has numerous admission and ED visits due a variety of medical issues:  9/6/19presented to the ED from Dialysis following onset of chest pain during dialysis(per chart, client left AMA). 9/1 presented to ED following a fall/head injury. 8/30-8/31 admitted with Upper GI bleed/symptomatic anemia. 7/23-7/28 observation with chest pain; nausea vomiting; volume overload  01/26/18 observation stay with UTI. 7/7-7/9 viral gastroenteritis/hypokalemia/diarrhea/ESRD. 6/20-6/25observationwithESRD/intractable nausea vomiting. 6/11-6/14 observation with refractory nausea/vomiting. 5/23-5/24 observation with nausea/vomiting/ESRD. 4/25-4/30 uremic encephalopathy. 4/19-4/23 diverticulitis/ESRD/intractable nausea and vomiting.  Client denies any issues at this time. She reports she has been back to dialysis without any problems. Denies pain, dizziness.   She reports a fall when she was getting out of the car on her way into church. She states it was due to some new shoes. RNCM reinforced the importance of fall prevention and fall prevention strategies.   Client is without questions or concerns at this time. RNCM discussed case closure. Client is agreeable. RNCM reinforced the 24 hour nurse advice line and encouraged client to call as needed. Client states she knows how to contact her providers and confirmed that she has transportation to her provider visits. She reports her daughter is taking her to her appointments.  Plan: close case.  Thea Silversmith, RN, MSN, [place] Coordinator Cell: [phone]

## 2018-03-20 DIAGNOSIS — N186 End stage renal disease: Secondary | ICD-10-CM | POA: Diagnosis not present

## 2018-03-20 DIAGNOSIS — N2581 Secondary hyperparathyroidism of renal origin: Secondary | ICD-10-CM | POA: Diagnosis not present

## 2018-03-20 DIAGNOSIS — R51 Headache: Secondary | ICD-10-CM | POA: Diagnosis not present

## 2018-03-20 DIAGNOSIS — D631 Anemia in chronic kidney disease: Secondary | ICD-10-CM | POA: Diagnosis not present

## 2018-03-20 DIAGNOSIS — D689 Coagulation defect, unspecified: Secondary | ICD-10-CM | POA: Diagnosis not present

## 2018-03-20 DIAGNOSIS — R079 Chest pain, unspecified: Secondary | ICD-10-CM | POA: Diagnosis not present

## 2018-03-20 DIAGNOSIS — D509 Iron deficiency anemia, unspecified: Secondary | ICD-10-CM | POA: Diagnosis not present

## 2018-03-23 DIAGNOSIS — D631 Anemia in chronic kidney disease: Secondary | ICD-10-CM | POA: Diagnosis not present

## 2018-03-23 DIAGNOSIS — N186 End stage renal disease: Secondary | ICD-10-CM | POA: Diagnosis not present

## 2018-03-23 DIAGNOSIS — N2581 Secondary hyperparathyroidism of renal origin: Secondary | ICD-10-CM | POA: Diagnosis not present

## 2018-03-23 DIAGNOSIS — D509 Iron deficiency anemia, unspecified: Secondary | ICD-10-CM | POA: Diagnosis not present

## 2018-03-23 DIAGNOSIS — R079 Chest pain, unspecified: Secondary | ICD-10-CM | POA: Diagnosis not present

## 2018-03-23 DIAGNOSIS — D689 Coagulation defect, unspecified: Secondary | ICD-10-CM | POA: Diagnosis not present

## 2018-03-23 DIAGNOSIS — R51 Headache: Secondary | ICD-10-CM | POA: Diagnosis not present

## 2018-03-24 ENCOUNTER — Ambulatory Visit: Payer: Self-pay

## 2018-03-24 DIAGNOSIS — N186 End stage renal disease: Secondary | ICD-10-CM | POA: Diagnosis not present

## 2018-03-24 DIAGNOSIS — Z992 Dependence on renal dialysis: Secondary | ICD-10-CM | POA: Diagnosis not present

## 2018-03-24 DIAGNOSIS — T82898D Other specified complication of vascular prosthetic devices, implants and grafts, subsequent encounter: Secondary | ICD-10-CM | POA: Diagnosis not present

## 2018-03-25 DIAGNOSIS — N186 End stage renal disease: Secondary | ICD-10-CM | POA: Diagnosis not present

## 2018-03-25 DIAGNOSIS — N2581 Secondary hyperparathyroidism of renal origin: Secondary | ICD-10-CM | POA: Diagnosis not present

## 2018-03-25 DIAGNOSIS — D631 Anemia in chronic kidney disease: Secondary | ICD-10-CM | POA: Diagnosis not present

## 2018-03-25 DIAGNOSIS — R51 Headache: Secondary | ICD-10-CM | POA: Diagnosis not present

## 2018-03-25 DIAGNOSIS — D509 Iron deficiency anemia, unspecified: Secondary | ICD-10-CM | POA: Diagnosis not present

## 2018-03-25 DIAGNOSIS — R079 Chest pain, unspecified: Secondary | ICD-10-CM | POA: Diagnosis not present

## 2018-03-25 DIAGNOSIS — D689 Coagulation defect, unspecified: Secondary | ICD-10-CM | POA: Diagnosis not present

## 2018-03-26 ENCOUNTER — Other Ambulatory Visit: Payer: Self-pay | Admitting: Pharmacist

## 2018-03-26 ENCOUNTER — Encounter (INDEPENDENT_AMBULATORY_CARE_PROVIDER_SITE_OTHER): Payer: Medicare Other | Admitting: Ophthalmology

## 2018-03-27 ENCOUNTER — Ambulatory Visit: Payer: Self-pay

## 2018-03-27 DIAGNOSIS — D509 Iron deficiency anemia, unspecified: Secondary | ICD-10-CM | POA: Diagnosis not present

## 2018-03-27 DIAGNOSIS — D689 Coagulation defect, unspecified: Secondary | ICD-10-CM | POA: Diagnosis not present

## 2018-03-27 DIAGNOSIS — N2581 Secondary hyperparathyroidism of renal origin: Secondary | ICD-10-CM | POA: Diagnosis not present

## 2018-03-27 DIAGNOSIS — D631 Anemia in chronic kidney disease: Secondary | ICD-10-CM | POA: Diagnosis not present

## 2018-03-27 DIAGNOSIS — R51 Headache: Secondary | ICD-10-CM | POA: Diagnosis not present

## 2018-03-27 DIAGNOSIS — N186 End stage renal disease: Secondary | ICD-10-CM | POA: Diagnosis not present

## 2018-03-27 DIAGNOSIS — R079 Chest pain, unspecified: Secondary | ICD-10-CM | POA: Diagnosis not present

## 2018-03-27 NOTE — Patient Outreach (Signed)
[facility]) Care Management  03/27/2018  Nancy [name] [birth year] [id number]  Patient and her CMA called on 03/26/18 stating the patient was not feeling well. HIPAA identifiers were obtained.  Patient was appropriately triaged and did not have chest pain, vision changes, uncontrollable nausea or vomiting, arm or neck pain, black stools or active bleeding.  Patient was offered 911 but she declined.  She was instructed to call her PCP ASAP.  Plan: Patient's case will remain closed.  Elayne Guerin, PharmD, [place] Clinical Pharmacist [phone]

## 2018-03-28 ENCOUNTER — Emergency Department (HOSPITAL_COMMUNITY)
Admission: EM | Admit: 2018-03-28 | Discharge: 2018-03-28 | Disposition: A | Discharge status: Home / Self Care | Payer: Medicare Other | Attending: Emergency Medicine | Admitting: Emergency Medicine

## 2018-03-28 ENCOUNTER — Emergency Department (HOSPITAL_COMMUNITY): Payer: Medicare Other

## 2018-03-28 ENCOUNTER — Encounter (HOSPITAL_COMMUNITY): Payer: Self-pay

## 2018-03-28 ENCOUNTER — Other Ambulatory Visit: Payer: Self-pay

## 2018-03-28 DIAGNOSIS — Z79899 Other long term (current) drug therapy: Secondary | ICD-10-CM | POA: Insufficient documentation

## 2018-03-28 DIAGNOSIS — I132 Hypertensive heart and chronic kidney disease with heart failure and with stage 5 chronic kidney disease, or end stage renal disease: Secondary | ICD-10-CM | POA: Diagnosis not present

## 2018-03-28 DIAGNOSIS — N186 End stage renal disease: Secondary | ICD-10-CM | POA: Insufficient documentation

## 2018-03-28 DIAGNOSIS — W19XXXA Unspecified fall, initial encounter: Secondary | ICD-10-CM | POA: Diagnosis not present

## 2018-03-28 DIAGNOSIS — Z7982 Long term (current) use of aspirin: Secondary | ICD-10-CM | POA: Diagnosis not present

## 2018-03-28 DIAGNOSIS — I509 Heart failure, unspecified: Secondary | ICD-10-CM | POA: Diagnosis not present

## 2018-03-28 DIAGNOSIS — Z789 Other specified health status: Secondary | ICD-10-CM

## 2018-03-28 DIAGNOSIS — R58 Hemorrhage, not elsewhere classified: Secondary | ICD-10-CM | POA: Diagnosis not present

## 2018-03-28 DIAGNOSIS — T82598A Other mechanical complication of other cardiac and vascular devices and implants, initial encounter: Secondary | ICD-10-CM | POA: Diagnosis not present

## 2018-03-28 DIAGNOSIS — J90 Pleural effusion, not elsewhere classified: Secondary | ICD-10-CM | POA: Diagnosis not present

## 2018-03-28 DIAGNOSIS — Z452 Encounter for adjustment and management of vascular access device: Secondary | ICD-10-CM | POA: Diagnosis not present

## 2018-03-28 DIAGNOSIS — J9811 Atelectasis: Secondary | ICD-10-CM | POA: Diagnosis not present

## 2018-03-28 LAB — BASIC METABOLIC PANEL
ANION GAP: 14 (ref 5–15)
BUN: 12 mg/dL (ref 8–23)
CALCIUM: 8.8 mg/dL — AB (ref 8.9–10.3)
CO2: 30 mmol/L (ref 22–32)
Chloride: 95 mmol/L — ABNORMAL LOW (ref 98–111)
Creatinine, Ser: 4.71 mg/dL — ABNORMAL HIGH (ref 0.44–1.00)
GFR calc Af Amer: 10 mL/min — ABNORMAL LOW (ref >60)
GFR calc non Af Amer: 8 mL/min — ABNORMAL LOW (ref >60)
GLUCOSE: 91 mg/dL (ref 70–99)
Potassium: 3.5 mmol/L (ref 3.5–5.1)
Sodium: 139 mmol/L (ref 135–145)

## 2018-03-28 LAB — CBC WITH DIFFERENTIAL/PLATELET
Abs Immature Granulocytes: 0 10*3/uL (ref 0.0–0.1)
BASOS ABS: 0 10*3/uL (ref 0.0–0.1)
Basophils Relative: 0 %
EOS ABS: 0.1 10*3/uL (ref 0.0–0.7)
Eosinophils Relative: 2 %
HCT: 31.8 % — ABNORMAL LOW (ref 36.0–46.0)
Hemoglobin: 9.5 g/dL — ABNORMAL LOW (ref 12.0–15.0)
IMMATURE GRANULOCYTES: 1 %
LYMPHS PCT: 13 %
Lymphs Abs: 0.8 10*3/uL (ref 0.7–4.0)
MCH: 30.2 pg (ref 26.0–34.0)
MCHC: 29.9 g/dL — ABNORMAL LOW (ref 30.0–36.0)
MCV: 101 fL — ABNORMAL HIGH (ref 78.0–100.0)
Monocytes Absolute: 0.7 10*3/uL (ref 0.1–1.0)
Monocytes Relative: 11 %
NEUTROS PCT: 73 %
Neutro Abs: 4.4 10*3/uL (ref 1.7–7.7)
Platelets: 155 10*3/uL (ref 150–400)
RBC: 3.15 MIL/uL — AB (ref 3.87–5.11)
RDW: 17.1 % — AB (ref 11.5–15.5)
WBC: 5.9 10*3/uL (ref 4.0–10.5)

## 2018-03-28 NOTE — ED Provider Notes (Signed)
[facility] EMERGENCY DEPARTMENT Provider Note   CSN: [id number] Arrival date & time:        History   Chief Complaint Chief Complaint  Patient presents with  . Vascular Access Problem    HPI Nancy [name] is a 77 y.o. female with a hx of CHF, CVA, HTN, anemia, and ESRD on hemodialysis (MWF)who presents to the ED via EMS with concern for bleeding from her dialysis catheter today.  Patient states that she has a left AV fistula in place that has been working appropriately, however she developed skin irritation superficial to this and therefore a L chest wall dialysis catheter was placed for temporary use. She states this was placed Tuesday 09/17 and that it has been utilized at dialysis. She relays that since placement she has had intermittent episodes of bleeding from the catheter site whenever she has a coughing spell, this has occurred multiple times throughout the day each day. She has called her daughter each time who has applied pressure and stopped the bleeding as instructed by dialysis team. She states today her daughter was busy and the bleeding scared her therefore she called 911. Bleeding controlled by EMS with 4x4 guaze pressure application. Patient states she has had her current cough for a year, productive with white phlegm, unchanged. Denies fever, chills, hemoptysis, dyspnea, or chest pain. She states that she takes aspirin sometimes, no other blood thinner use.  She last had dialysis yesterday, 1/2 treatment, stopped early due to coughing.    HPI  Past Medical History:  Diagnosis Date  . Anemia   . Aortic stenosis   . Bacterial sinusitis 09/17/2011  . CHF (congestive heart failure) ([place])   . CKD (chronic kidney disease) stage 4, GFR 15-29 ml/min ([place]) 08/11/2006   Cr continues to increase. Proteinuria on UA 02/10/12.    . Colitis   . CVA (cerebrovascular accident) [facility])    New hemorrhagic per CT scan '09  . Diverticulosis of colon   . Dysfunctional  uterine bleeding   . ESRD (end stage renal disease) on dialysis ([place])    "MWF; [address]" (11/27/2017)  . Fecal impaction ([place])   . Headache(784.0)   . Heart murmur   . HERNIORRHAPHY, HX OF 08/11/2006  . Hypertension   . OA (osteoarthritis)    bilateral knees  . Postmenopausal   . Pulmonary nodule   . TINEA CRURIS 01/12/2007    Patient Active Problem List   Diagnosis Date Noted  . Acute gastritis without hemorrhage   . Duodenitis with bleeding   . ESRD (end stage renal disease) ([place])   . Pityriasis rosea 03/06/2018  . Acute on chronic blood loss anemia 03/06/2018  . Protein-calorie malnutrition, severe 01/12/2018  . Midline thoracic back pain 12/23/2017  . Symptomatic anemia 02/17/2017  . Secondary hyperparathyroidism of renal origin ([place]) 01/15/2017  . Mitral valve annular calcification   . Dizziness 12/08/2016  . Angina at rest [facility])   . Goals of care, counseling/discussion   . Aortic stenosis 05/21/2016  . Anemia associated with chronic renal failure 02/01/2016  . Atherosclerosis of aorta ([place]) 01/11/2015  . End stage renal disease ([place]) 02/04/2013  . Non-intractable vomiting with nausea 08/17/2012  . Glaucoma 03/18/2012  . Health care maintenance 09/17/2011  . Osteoarthrosis involving lower leg 10/31/2008  . History of CVA (cerebrovascular accident) 01/28/2008  . Hyperlipidemia 02/13/2007  . PULMONARY NODULES 01/12/2007  . Left ventricular hypertrophy 09/02/2006  . Essential hypertension 08/11/2006  . GERD 08/11/2006  Past Surgical History:  Procedure Laterality Date  . ABDOMINAL HYSTERECTOMY    . AV FISTULA PLACEMENT Left 02/19/2017   Procedure: CREATION OF LEFT ARM BRACHIOCEPHALIC ARTERIOVENOUS (AV) FISTULA;  Surgeon: Rosetta Posner, MD;  Location: [place];  Service: Vascular;  Laterality: Left;  . BASCILIC VEIN TRANSPOSITION Left 04/23/2017   Procedure: BASILIC VEIN TRANSPOSITION SECOND STAGE;  Surgeon: Rosetta Posner, MD;  Location: [facility];  Service: Vascular;   Laterality: Left;  . BIOPSY  02/20/2018   Procedure: BIOPSY;  Surgeon: Carol Ada, MD;  Location: [facility];  Service: Endoscopy;;  . BIOPSY  03/07/2018   Procedure: BIOPSY;  Surgeon: Carol Ada, MD;  Location: [facility] ENDOSCOPY;  Service: Endoscopy;;  . CHOLECYSTECTOMY  2009  . COLONOSCOPY    . ESOPHAGOGASTRODUODENOSCOPY N/A 03/07/2018   Procedure: ESOPHAGOGASTRODUODENOSCOPY (EGD);  Surgeon: Carol Ada, MD;  Location: [place];  Service: Endoscopy;  Laterality: N/A;  . ESOPHAGOGASTRODUODENOSCOPY (EGD) WITH PROPOFOL N/A 02/20/2018   Procedure: ESOPHAGOGASTRODUODENOSCOPY (EGD) WITH PROPOFOL;  Surgeon: Carol Ada, MD;  Location: [facility] ENDOSCOPY;  Service: Endoscopy;  Laterality: N/A;  . INGUINAL HERNIA REPAIR  2008  . INSERTION OF DIALYSIS CATHETER Right 02/19/2017   Procedure: INSERTION OF TUNNELED DIALYSIS CATHETER - RIGHT INTERNAL JUGULAR PLACEMENT;  Surgeon: Rosetta Posner, MD;  Location: [place];  Service: Vascular;  Laterality: Right;  . IRIDOTOMY / IRIDECTOMY     Laser, right eye 12/26/11 left eye 01/24/12  . MASS EXCISION Left 05/07/2013   Procedure: EXCISION CYST;  Surgeon: Myrtha Mantis., MD;  Location: [place];  Service: Ophthalmology;  Laterality: Left;  . SAVORY DILATION N/A 02/20/2018   Procedure: SAVORY DILATION;  Surgeon: Carol Ada, MD;  Location: [facility] ENDOSCOPY;  Service: Endoscopy;  Laterality: N/A;     OB History   None      Home Medications    Prior to Admission medications   Medication Sig Start Date End Date Taking? Authorizing Provider  amLODipine (NORVASC) 10 MG tablet Take 1 tablet (10 mg total) by mouth daily. Patient not taking: Reported on 03/07/2018 02/02/18   [name], MD  aspirin EC 81 MG EC tablet Take 1 tablet (81 mg total) by mouth daily. 12/10/16   Valinda Party, DO  calcitRIOL (ROCALTROL) 0.5 MCG capsule Take 4 capsules (2 mcg total) by mouth every Monday, Wednesday, and Friday with hemodialysis. 12/30/17    Neva Seat, MD  diclofenac sodium (VOLTAREN) 1 % GEL Apply 4 g topically 4 (four) times daily. 01/20/18   Mosetta Anis, MD  hydrocortisone cream 0.5 % Apply 1 application topically 2 (two) times daily. Patient taking differently: Apply 1 application topically 2 (two) times daily as needed (arthritis pain).  03/05/18   Chundi, Verne Spurr, MD  mirtazapine (REMERON) 15 MG tablet Take 1 tablet (15 mg total) by mouth at bedtime. 02/01/18   [name], MD  nitroGLYCERIN (NITROSTAT) 0.3 MG SL tablet Place 1 tablet (0.3 mg total) under the tongue every 5 (five) minutes as needed for chest pain. 02/27/17   Sid Falcon, MD  pantoprazole (PROTONIX) 40 MG tablet Take 1 tablet (40 mg total) by mouth daily. 01/14/18 03/13/18  [name], MD  promethazine (PHENERGAN) 12.5 MG tablet Take 1 tablet (12.5 mg total) by mouth every 6 (six) hours as needed for nausea or vomiting (Take 1 prior to dialysis). Take 1 tablet half an hour before meal. 02/01/18   [name], MD  sucroferric oxyhydroxide (VELPHORO) 500 MG chewable tablet Chew 1,500 mg  by mouth 3 (three) times daily with meals.     Rexene Agent, MD    Family History Family History  Problem Relation Age of Onset  . Hypertension Mother   . Congestive Heart Failure Mother   . Heart attack Brother 42    Social History Social History   Tobacco Use  . Smoking status: Never Smoker  . Smokeless tobacco: Never Used  Substance Use Topics  . Alcohol use: No    Alcohol/week: 0.0 standard drinks  . Drug use: No    Comment: 08/15/08 UDS + cocaine     Allergies   Hydrocodone   Review of Systems Review of Systems  Constitutional: Negative for chills and fever.  Respiratory: Positive for cough. Negative for shortness of breath.   Cardiovascular: Negative for chest pain.  Gastrointestinal: Negative for abdominal pain, nausea and vomiting.  Skin: Positive for wound.  All other systems reviewed and are negative.    Physical Exam Updated  Vital Signs BP (!) 167/61 (BP Location: Right Arm)   Pulse 98   Temp 98.3 F (36.8 C) (Oral)   Resp 18   SpO2 98%   Physical Exam  Constitutional: She appears well-developed and well-nourished.  Non-toxic appearance. No distress.  HENT:  Head: Normocephalic and atraumatic.  Eyes: Conjunctivae are normal. Right eye exhibits no discharge. Left eye exhibits no discharge.  Neck: Neck supple.  Cardiovascular: Normal rate and regular rhythm.  Murmur heard. Pulmonary/Chest: Effort normal. No respiratory distress. She has decreased breath sounds (bibasilar). She has no wheezes. She has no rhonchi. She has no rales.  Left anterior chest wall with dialysis catheter in place.  No active bleeding.  No surrounding erythema or warmth.  No purulent drainage.  Abdominal: Soft. She exhibits no distension. There is no tenderness.  Musculoskeletal:  LUE: Left AV fistula in place with palpable thrill, overlying skin appears dry, irritated, some patches of erythema, not warm to touch, no streaking erythema.  No purulent drainage. No palpable fluctuance/induration.   Neurological: She is alert.  Clear speech.   Skin: Skin is warm and dry. No rash noted.  Psychiatric: She has a normal mood and affect. Her behavior is normal.  Nursing note and vitals reviewed.    ED Treatments / Results  Labs Results for orders placed or performed during the hospital encounter of 17/71/16  Basic metabolic panel  Result Value Ref Range   Sodium 139 135 - 145 mmol/L   Potassium 3.5 3.5 - 5.1 mmol/L   Chloride 95 (L) 98 - 111 mmol/L   CO2 30 22 - 32 mmol/L   Glucose, Bld 91 70 - 99 mg/dL   BUN 12 8 - 23 mg/dL   Creatinine, Ser 4.71 (H) 0.44 - 1.00 mg/dL   Calcium 8.8 (L) 8.9 - 10.3 mg/dL   GFR calc non Af Amer 8 (L) >60 mL/min   GFR calc Af Amer 10 (L) >60 mL/min   Anion gap 14 5 - 15  CBC with Differential  Result Value Ref Range   WBC 5.9 4.0 - 10.5 K/uL   RBC 3.15 (L) 3.87 - 5.11 MIL/uL   Hemoglobin 9.5 (L)  12.0 - 15.0 g/dL   HCT 31.8 (L) 36.0 - 46.0 %   MCV 101.0 (H) 78.0 - 100.0 fL   MCH 30.2 26.0 - 34.0 pg   MCHC 29.9 (L) 30.0 - 36.0 g/dL   RDW 17.1 (H) 11.5 - 15.5 %   Platelets 155 150 - 400 K/uL  Neutrophils Relative % 73 %   Neutro Abs 4.4 1.7 - 7.7 K/uL   Lymphocytes Relative 13 %   Lymphs Abs 0.8 0.7 - 4.0 K/uL   Monocytes Relative 11 %   Monocytes Absolute 0.7 0.1 - 1.0 K/uL   Eosinophils Relative 2 %   Eosinophils Absolute 0.1 0.0 - 0.7 K/uL   Basophils Relative 0 %   Basophils Absolute 0.0 0.0 - 0.1 K/uL   Immature Granulocytes 1 %   Abs Immature Granulocytes 0.0 0.0 - 0.1 K/uL    EKG None  Radiology Dg Chest 2 View  Result Date: 03/28/2018 CLINICAL DATA:  New dialysis catheter, bleeding at the catheter site EXAM: CHEST - 2 VIEW COMPARISON:  None. FINDINGS: Left IJ dialysis catheter tips SVC RA junction. Cardiomegaly with increased vascular and interstitial prominence compatible with mild CHF. Small right effusion noted. Minor basilar atelectasis. No pneumothorax. Aorta is atherosclerotic. Trachea is midline. Degenerative changes of the spine and shoulders. IMPRESSION: Cardiomegaly with mild interstitial edema pattern compatible with early CHF Trace pleural effusion on the right and basilar atelectasis Left IJ dialysis catheter tip SVC RA junction level Electronically Signed   By: Jerilynn Mages.  Shick M.D.   On: 03/28/2018 14:37    Procedures Procedures (including critical care time)  Medications Ordered in ED Medications - No data to display   Initial Impression / Assessment and Plan / ED Course  I have reviewed the triage vital signs and the nursing notes.  Pertinent labs & imaging results that were available during my care of the patient were reviewed by me and considered in my medical decision making (see chart for details).   Patient presents to the emergency department due to bleeding from dialysis catheter site.  Patient nontoxic-appearing, no apparent distress, vitals  notable for elevated blood pressure, doubt HTN emergency, patient aware of need for recheck.  On my initial assessment bleeding had appeared to resolve, she began to cough, and the area subsequently began to bleed somewhat again.  Pressure was applied with subsequent Surgicel & Tegaderm dressing with initial hemostasis.  Her LUE AV fistula site appears irritated, but not infected, patient continually scratching at this area, supervising physician Dr. Melina Copa recommends OTC topical steroids to help with this. Basic labs obtained and appear at baseline.  Her hemoglobin of 9.5 is improved from prior on record from 2 weeks ago.  She has mild electrolyte disturbances including hypochloremia at 95 and hypocalcemia at 8.8.  Her creatinine is consistent with prior.  Chest x-ray reveals cardiomegaly with mild interstitial edema pattern compatible with early CHF, there is a trace pleural effusion on the right with bibasilar atelectasis.  Her left IJ dialysis catheter tip in place SVC/RA junction level. Patient without dyspnea, no evidence of respiratory distress, hx of similar CXRs.   16:25: RE-EVAL: Appears patient has started to bleed through prior dressing application, this will be removed, will trial quick clot.   17:41: RE-EVAL: Patient has bled through quick clot dressing. Will trial combat gauze, tegaderm, and ace wrap for pressure and re-assess.   20:00: RE-EVAL: No bleeding through dressing, patient requesting to go home, she has called her granddaughter for transport.   She feels ready to go home, we will have her keep the dressing in place. I discussed results, treatment plan, need for follow-up, and return precautions with the patient. Provided opportunity for questions, patient confirmed understanding and is in agreement with plan.   Findings and plan of care discussed with supervising physician Dr. Melina Copa who  personally evaluated and examined this patient.  Clinical Course as of Mar 28 1512  Sat Mar 28, 2018  1439 She is here for evaluation of bleeding at her central access catheter site.  Sounds like she had some superficial dermatitis over her left arm fistula and so they placed a line in to do her dialysis.  Since then she has had bleeding from the skin where the puncture site was.  She otherwise looks nontoxic appearing in no distress.  She is getting an x-ray and some blood work and we will try to put a dressing over this line.  She will likely be discharged to follow-up with her dialysis center.   [MB]    Clinical Course User Index [MB] Hayden Rasmussen, MD    Final Clinical Impressions(s) / ED Diagnoses   Final diagnoses:  Problem with vascular access    ED Discharge Orders    None       Leafy Kindle 03/28/18 [id number]    Hayden Rasmussen, MD 03/29/18 [phone]

## 2018-03-28 NOTE — Discharge Instructions (Signed)
You are seen in the emergency department today due to bleeding from your dialysis catheter.  We stopped the bleeding in the emergency department with pressure in a special type of dressing.  Your labs all appear to be consistent with prior labs you have had done.  Your hemoglobin is improved from prior labs.  Chest x-ray shows a bit of fluid which for the most part it has shown previously.   We would like you to utilize over-the-counter topical steroids to help with the itching in the area of your left AV fistula, please use these per over-the-counter dosing.  Would like you to apply pressure should this area start bleeding again, continues to bleed return to the ER.  Would like you to follow with your primary care provider as well as the dialysis center and or your nephrologist within the next 3 days.  Return to the ER for new or worsening symptoms including but not limited to uncontrolled bleeding, chest pain, trouble breathing, lightheadedness, dizziness, passing out, or any other concerns that you may have.

## 2018-03-28 NOTE — ED Notes (Signed)
Pt departed in NAD.  

## 2018-03-28 NOTE — ED Notes (Signed)
Surgicell w/ tegaderm applied to site. Hemostatic at this time, dsg C/D/I

## 2018-03-28 NOTE — ED Notes (Signed)
Pressure dressing applied to site.

## 2018-03-28 NOTE — ED Notes (Signed)
Combat gauze, tegarderm, and ace wrap applied. Bleeding controlled at this time.

## 2018-03-28 NOTE — ED Triage Notes (Signed)
Pt brought in by [facility] from home for central line bleeding. Per EMS pt had line placed this past Tuesday for dialysis. Pt states bleeding started around 1200 today. Pt attempted to put bandaids over site but states every time she coughs it starts bleeding again. Per EMS bleeding currently controlled with 4x4 and pressure. Pt in NAD on arrival.

## 2018-03-28 NOTE — ED Notes (Signed)
Approached pt for DC prep and noted that pt was bleeding through previously applied surgicell dsg. PA Petrucelli made aware, applied quick clot and fresh dsg to site. Will continue to monitor for approx 1 hour to assess dsg and bleeding prior to DC

## 2018-03-28 NOTE — ED Notes (Signed)
Pt to nurse's station, requesting to call her family so that she can go home. States that her site is no longer bleeding now and that she's ready to go. Tried to encourage pt to wait a bit longer so that we could make sure the bleeding has stopped, but pt insisting on calling for her ride home. Pt given phone to make call. Provider informed.

## 2018-03-29 ENCOUNTER — Emergency Department (HOSPITAL_COMMUNITY)
Admission: EM | Admit: 2018-03-29 | Discharge: 2018-03-29 | Disposition: A | Discharge status: Home / Self Care | Payer: Medicare Other | Attending: Emergency Medicine | Admitting: Emergency Medicine

## 2018-03-29 ENCOUNTER — Other Ambulatory Visit: Payer: Self-pay

## 2018-03-29 ENCOUNTER — Encounter (HOSPITAL_COMMUNITY): Payer: Self-pay | Admitting: Emergency Medicine

## 2018-03-29 DIAGNOSIS — T82898A Other specified complication of vascular prosthetic devices, implants and grafts, initial encounter: Secondary | ICD-10-CM | POA: Diagnosis not present

## 2018-03-29 DIAGNOSIS — Z9049 Acquired absence of other specified parts of digestive tract: Secondary | ICD-10-CM | POA: Diagnosis not present

## 2018-03-29 DIAGNOSIS — Z992 Dependence on renal dialysis: Secondary | ICD-10-CM | POA: Insufficient documentation

## 2018-03-29 DIAGNOSIS — T8249XA Other complication of vascular dialysis catheter, initial encounter: Secondary | ICD-10-CM | POA: Diagnosis not present

## 2018-03-29 DIAGNOSIS — T829XXA Unspecified complication of cardiac and vascular prosthetic device, implant and graft, initial encounter: Secondary | ICD-10-CM

## 2018-03-29 DIAGNOSIS — I2510 Atherosclerotic heart disease of native coronary artery without angina pectoris: Secondary | ICD-10-CM | POA: Diagnosis not present

## 2018-03-29 DIAGNOSIS — Y829 Unspecified medical devices associated with adverse incidents: Secondary | ICD-10-CM | POA: Diagnosis not present

## 2018-03-29 DIAGNOSIS — Z79899 Other long term (current) drug therapy: Secondary | ICD-10-CM | POA: Diagnosis not present

## 2018-03-29 DIAGNOSIS — N186 End stage renal disease: Secondary | ICD-10-CM

## 2018-03-29 DIAGNOSIS — Z8673 Personal history of transient ischemic attack (TIA), and cerebral infarction without residual deficits: Secondary | ICD-10-CM | POA: Diagnosis not present

## 2018-03-29 DIAGNOSIS — I120 Hypertensive chronic kidney disease with stage 5 chronic kidney disease or end stage renal disease: Secondary | ICD-10-CM | POA: Diagnosis not present

## 2018-03-29 DIAGNOSIS — Z7982 Long term (current) use of aspirin: Secondary | ICD-10-CM | POA: Insufficient documentation

## 2018-03-29 DIAGNOSIS — T82838A Hemorrhage of vascular prosthetic devices, implants and grafts, initial encounter: Secondary | ICD-10-CM | POA: Diagnosis present

## 2018-03-29 LAB — CBC
HCT: 32.5 % — ABNORMAL LOW (ref 36.0–46.0)
Hemoglobin: 9.6 g/dL — ABNORMAL LOW (ref 12.0–15.0)
MCH: 29.9 pg (ref 26.0–34.0)
MCHC: 29.5 g/dL — ABNORMAL LOW (ref 30.0–36.0)
MCV: 101.2 fL — ABNORMAL HIGH (ref 78.0–100.0)
PLATELETS: 174 10*3/uL (ref 150–400)
RBC: 3.21 MIL/uL — AB (ref 3.87–5.11)
RDW: 17.6 % — AB (ref 11.5–15.5)
WBC: 5.6 10*3/uL (ref 4.0–10.5)

## 2018-03-29 LAB — I-STAT CHEM 8, ED
BUN: 18 mg/dL (ref 8–23)
CALCIUM ION: 1.07 mmol/L — AB (ref 1.15–1.40)
Chloride: 95 mmol/L — ABNORMAL LOW (ref 98–111)
Creatinine, Ser: 6.3 mg/dL — ABNORMAL HIGH (ref 0.44–1.00)
Glucose, Bld: 108 mg/dL — ABNORMAL HIGH (ref 70–99)
HCT: 31 % — ABNORMAL LOW (ref 36.0–46.0)
HEMOGLOBIN: 10.5 g/dL — AB (ref 12.0–15.0)
Potassium: 3.6 mmol/L (ref 3.5–5.1)
SODIUM: 137 mmol/L (ref 135–145)
TCO2: 29 mmol/L (ref 22–32)

## 2018-03-29 LAB — PROTIME-INR
INR: 1.13
PROTHROMBIN TIME: 14.4 s (ref 11.4–15.2)

## 2018-03-29 MED ORDER — VANCOMYCIN HCL IN DEXTROSE 1-5 GM/200ML-% IV SOLN
1000.0000 mg | Freq: Once | INTRAVENOUS | Status: AC
  Administered 2018-03-29: 1000 mg via INTRAVENOUS
  Filled 2018-03-29: qty 200

## 2018-03-29 MED ORDER — SODIUM CHLORIDE 0.9 % IV SOLN
INTRAVENOUS | Status: DC | PRN
  Administered 2018-03-29: 13:00:00 via INTRAVENOUS

## 2018-03-29 NOTE — ED Notes (Signed)
Patient verbalizes understanding of discharge instructions. Opportunity for questioning and answers were provided. Armband removed by staff, pt discharged from ED.  

## 2018-03-29 NOTE — ED Triage Notes (Signed)
Patient presents to the ED with complaints of of bleeding around her Dialysis cathter. Patient states the bleeding starting Friday and still received a full treatment. Was seen last night for similar complaints. Patient states when she woke she found blood all over her sheet, night gown and cath. Patient alert and oriented, denies any pain or shortness of breath.

## 2018-03-29 NOTE — Discharge Instructions (Addendum)
Watch for more bleeding or signs of infection.  Follow-up for dialysis tomorrow.

## 2018-03-29 NOTE — ED Notes (Addendum)
IV team at the bedside to clean dialysis site

## 2018-03-29 NOTE — Progress Notes (Signed)
CSW met with patient via bedside to offer any support needed. CSW questioned patient if she was needing help at home or had any questions that this writer could addressed. Patient states she currently lives at home alone and does pretty well. Patient states she uses a cane to get around and has her cane with her at this time. Patient questioned when she could leave the hospital- CSW will update EDP. No concerns to be addressed at this time.   Kingsley Spittle, [place]  [phone]

## 2018-03-29 NOTE — ED Provider Notes (Signed)
[facility] EMERGENCY DEPARTMENT Provider Note   CSN: [id number] Arrival date & time: 03/29/18  1002     History   Chief Complaint Chief Complaint  Patient presents with  . Vascular Access Problem    HPI Nancy [name] is a 77 y.o. female.  HPI Patient presents with bleeding from her dialysis catheter.  Seen yesterday for same.  Reportedly woke up today with blood in her bed.  Is wrapped up more.  Had a chest catheter placed earlier this week it appears by interventional nephrology.  More blood today.  She is due for dialysis tomorrow.  No chest pain or trouble breathing.  No fevers. Past Medical History:  Diagnosis Date  . Anemia   . Aortic stenosis   . Bacterial sinusitis 09/17/2011  . CHF (congestive heart failure) ([place])   . CKD (chronic kidney disease) stage 4, GFR 15-29 ml/min ([place]) 08/11/2006   Cr continues to increase. Proteinuria on UA 02/10/12.    . Colitis   . CVA (cerebrovascular accident) [facility])    New hemorrhagic per CT scan '09  . Diverticulosis of colon   . Dysfunctional uterine bleeding   . ESRD (end stage renal disease) on dialysis ([place])    "MWF; [facility]" (11/27/2017)  . Fecal impaction ([place])   . Headache(784.0)   . Heart murmur   . HERNIORRHAPHY, HX OF 08/11/2006  . Hypertension   . OA (osteoarthritis)    bilateral knees  . Postmenopausal   . Pulmonary nodule   . TINEA CRURIS 01/12/2007    Patient Active Problem List   Diagnosis Date Noted  . Acute gastritis without hemorrhage   . Duodenitis with bleeding   . ESRD (end stage renal disease) ([place])   . Pityriasis rosea 03/06/2018  . Acute on chronic blood loss anemia 03/06/2018  . Protein-calorie malnutrition, severe 01/12/2018  . Midline thoracic back pain 12/23/2017  . Symptomatic anemia 02/17/2017  . Secondary hyperparathyroidism of renal origin ([place]) 01/15/2017  . Mitral valve annular calcification   . Dizziness 12/08/2016  . Angina at rest [facility])   . Goals of care,  counseling/discussion   . Aortic stenosis 05/21/2016  . Anemia associated with chronic renal failure 02/01/2016  . Atherosclerosis of aorta ([place]) 01/11/2015  . End stage renal disease ([place]) 02/04/2013  . Non-intractable vomiting with nausea 08/17/2012  . Glaucoma 03/18/2012  . Health care maintenance 09/17/2011  . Osteoarthrosis involving lower leg 10/31/2008  . History of CVA (cerebrovascular accident) 01/28/2008  . Hyperlipidemia 02/13/2007  . PULMONARY NODULES 01/12/2007  . Left ventricular hypertrophy 09/02/2006  . Essential hypertension 08/11/2006  . GERD 08/11/2006    Past Surgical History:  Procedure Laterality Date  . ABDOMINAL HYSTERECTOMY    . AV FISTULA PLACEMENT Left 02/19/2017   Procedure: CREATION OF LEFT ARM BRACHIOCEPHALIC ARTERIOVENOUS (AV) FISTULA;  Surgeon: Rosetta Posner, MD;  Location: [place];  Service: Vascular;  Laterality: Left;  . BASCILIC VEIN TRANSPOSITION Left 04/23/2017   Procedure: BASILIC VEIN TRANSPOSITION SECOND STAGE;  Surgeon: Rosetta Posner, MD;  Location: [place];  Service: Vascular;  Laterality: Left;  . BIOPSY  02/20/2018   Procedure: BIOPSY;  Surgeon: Carol Ada, MD;  Location: [facility];  Service: Endoscopy;;  . BIOPSY  03/07/2018   Procedure: BIOPSY;  Surgeon: Carol Ada, MD;  Location: [facility] ENDOSCOPY;  Service: Endoscopy;;  . CHOLECYSTECTOMY  2009  . COLONOSCOPY    . ESOPHAGOGASTRODUODENOSCOPY N/A 03/07/2018   Procedure: ESOPHAGOGASTRODUODENOSCOPY (EGD);  Surgeon: Carol Ada, MD;  Location: [facility] ENDOSCOPY;  Service: Endoscopy;  Laterality: N/A;  . ESOPHAGOGASTRODUODENOSCOPY (EGD) WITH PROPOFOL N/A 02/20/2018   Procedure: ESOPHAGOGASTRODUODENOSCOPY (EGD) WITH PROPOFOL;  Surgeon: Carol Ada, MD;  Location: [facility] ENDOSCOPY;  Service: Endoscopy;  Laterality: N/A;  . INGUINAL HERNIA REPAIR  2008  . INSERTION OF DIALYSIS CATHETER Right 02/19/2017   Procedure: INSERTION OF TUNNELED DIALYSIS CATHETER - RIGHT INTERNAL JUGULAR PLACEMENT;  Surgeon:  Rosetta Posner, MD;  Location: [place];  Service: Vascular;  Laterality: Right;  . IRIDOTOMY / IRIDECTOMY     Laser, right eye 12/26/11 left eye 01/24/12  . MASS EXCISION Left 05/07/2013   Procedure: EXCISION CYST;  Surgeon: Myrtha Mantis., MD;  Location: [place];  Service: Ophthalmology;  Laterality: Left;  . SAVORY DILATION N/A 02/20/2018   Procedure: SAVORY DILATION;  Surgeon: Carol Ada, MD;  [facility] ENDOS[facility];  Service: Endoscopy;  Laterality: N/A;     OB History   None      Home Medications    Prior to Admission medications   Medication Sig Start Date End Date Taking? Authorizing Provider  aspirin EC 81 MG EC tablet Take 1 tablet (81 mg total) by mouth daily. Patient taking differently: Take 81 mg by mouth daily as needed for pain.  12/10/16  Yes Valinda Party, DO  calcitRIOL (ROCALTROL) 0.5 MCG capsule Take 4 capsules (2 mcg total) by mouth every Monday, Wednesday, and Friday with hemodialysis. 12/30/17  Yes Neva Seat, MD  diclofenac sodium (VOLTAREN) 1 % GEL Apply 4 g topically 4 (four) times daily. Patient taking differently: Apply 4 g topically every other day.  01/20/18  Yes Mosetta Anis, MD  nitroGLYCERIN (NITROSTAT) 0.3 MG SL tablet Place 1 tablet (0.3 mg total) under the tongue every 5 (five) minutes as needed for chest pain. 02/27/17  Yes Sid Falcon, MD  pantoprazole (PROTONIX) 40 MG tablet Take 1 tablet (40 mg total) by mouth daily. Patient taking differently: Take 40 mg by mouth daily as needed (acid reflux).  01/14/18 03/29/18 Yes [name], MD  promethazine (PHENERGAN) 12.5 MG tablet Take 1 tablet (12.5 mg total) by mouth every 6 (six) hours as needed for nausea or vomiting (Take 1 prior to dialysis). Take 1 tablet half an hour before meal. 02/01/18  Yes [name], MD  sucroferric oxyhydroxide (VELPHORO) 500 MG chewable tablet Chew 1,500 mg by mouth 3 (three) times daily with meals.    Yes Rexene Agent, MD    hydrocortisone cream 0.5 % Apply 1 application topically 2 (two) times daily. Patient not taking: Reported on 03/29/2018 03/05/18   Lars Mage, MD  mirtazapine (REMERON) 15 MG tablet Take 1 tablet (15 mg total) by mouth at bedtime. Patient not taking: Reported on 03/29/2018 02/01/18   [name], MD    Family History Family History  Problem Relation Age of Onset  . Hypertension Mother   . Congestive Heart Failure Mother   . Heart attack Brother 45    Social History Social History   Tobacco Use  . Smoking status: Never Smoker  . Smokeless tobacco: Never Used  Substance Use Topics  . Alcohol use: No    Alcohol/week: 0.0 standard drinks  . Drug use: No    Comment: 08/15/08 UDS + cocaine     Allergies   Hydrocodone   Review of Systems Review of Systems  Constitutional: Negative for appetite change.  HENT: Negative for congestion.   Respiratory: Negative for shortness of breath.   Cardiovascular:  Negative for chest pain.  Gastrointestinal: Negative for abdominal distention.  Genitourinary: Negative for flank pain.  Skin: Positive for wound.  Hematological: Does not bruise/bleed easily.     Physical Exam Updated Vital Signs BP (!) 164/77   Pulse (!) 101   Temp 98.6 F (37 C) (Oral)   Resp 16   Ht 5\' 3"  (1.6 m)   Wt 55.7 kg   SpO2 98%   BMI 21.75 kg/m   Physical Exam  Constitutional: She appears well-developed.  HENT:  Head: Normocephalic.  Neck: Neck supple.  Cardiovascular: Normal rate.  Pulmonary/Chest:  Dialysis catheter left chest wall.  There is dried blood on it.  No active bleeding.  Musculoskeletal:  Dialysis graft left upper extremity with some scaling skin changes on it.  There is a thrill.  Neurological: She is alert.  Skin: Skin is warm. Capillary refill takes less than 2 seconds.     ED Treatments / Results  Labs (all labs ordered are listed, but only abnormal results are displayed) Labs Reviewed  CBC - Abnormal; Notable for the  following components:      Result Value   RBC 3.21 (*)    Hemoglobin 9.6 (*)    HCT 32.5 (*)    MCV 101.2 (*)    MCHC 29.5 (*)    RDW 17.6 (*)    All other components within normal limits  I-STAT CHEM 8, ED - Abnormal; Notable for the following components:   Chloride 95 (*)    Creatinine, Ser 6.30 (*)    Glucose, Bld 108 (*)    Calcium, Ion 1.07 (*)    Hemoglobin 10.5 (*)    HCT 31.0 (*)    All other components within normal limits  PROTIME-INR    EKG None  Radiology Dg Chest 2 View  Result Date: 03/28/2018 CLINICAL DATA:  New dialysis catheter, bleeding at the catheter site EXAM: CHEST - 2 VIEW COMPARISON:  None. FINDINGS: Left IJ dialysis catheter tips SVC RA junction. Cardiomegaly with increased vascular and interstitial prominence compatible with mild CHF. Small right effusion noted. Minor basilar atelectasis. No pneumothorax. Aorta is atherosclerotic. Trachea is midline. Degenerative changes of the spine and shoulders. IMPRESSION: Cardiomegaly with mild interstitial edema pattern compatible with early CHF Trace pleural effusion on the right and basilar atelectasis Left IJ dialysis catheter tip SVC RA junction level Electronically Signed   By: Jerilynn Mages.  Shick M.D.   On: 03/28/2018 14:37    Procedures Procedures (including critical care time)  Medications Ordered in ED Medications  vancomycin (VANCOCIN) IVPB 1000 mg/200 mL premix (1,000 mg Intravenous New Bag/Given 03/29/18 1328)  0.9 %  sodium chloride infusion ( Intravenous New Bag/Given 03/29/18 1327)     Initial Impression / Assessment and Plan / ED Course  I have reviewed the triage vital signs and the nursing notes.  Pertinent labs & imaging results that were available during my care of the patient were reviewed by me and considered in my medical decision making (see chart for details).     Patient with bleeding from dialysis catheter.  Hemoglobin stable.  Discussed with Dr. Jonnie Finner from nephrology.  Will give single  dose of vancomycin since that has been exposed and this will hopefully help decrease chance of infection.  Seen by social work because friends thought patient may need more help.  Patient denies needing more help.  Will discharge for outpatient follow-up tomorrow. Final Clinical Impressions(s) / ED Diagnoses   Final diagnoses:  Complication of vascular access  for dialysis, initial encounter  ESRD (end stage renal disease) on dialysis [name])    ED Discharge Orders    None       Davonna Belling, MD 03/29/18 1340

## 2018-03-30 DIAGNOSIS — R079 Chest pain, unspecified: Secondary | ICD-10-CM | POA: Diagnosis not present

## 2018-03-30 DIAGNOSIS — D689 Coagulation defect, unspecified: Secondary | ICD-10-CM | POA: Diagnosis not present

## 2018-03-30 DIAGNOSIS — N186 End stage renal disease: Secondary | ICD-10-CM | POA: Diagnosis not present

## 2018-03-30 DIAGNOSIS — D631 Anemia in chronic kidney disease: Secondary | ICD-10-CM | POA: Diagnosis not present

## 2018-03-30 DIAGNOSIS — N2581 Secondary hyperparathyroidism of renal origin: Secondary | ICD-10-CM | POA: Diagnosis not present

## 2018-03-30 DIAGNOSIS — R51 Headache: Secondary | ICD-10-CM | POA: Diagnosis not present

## 2018-03-30 DIAGNOSIS — D509 Iron deficiency anemia, unspecified: Secondary | ICD-10-CM | POA: Diagnosis not present

## 2018-04-01 DIAGNOSIS — R079 Chest pain, unspecified: Secondary | ICD-10-CM | POA: Diagnosis not present

## 2018-04-01 DIAGNOSIS — R51 Headache: Secondary | ICD-10-CM | POA: Diagnosis not present

## 2018-04-01 DIAGNOSIS — D509 Iron deficiency anemia, unspecified: Secondary | ICD-10-CM | POA: Diagnosis not present

## 2018-04-01 DIAGNOSIS — D631 Anemia in chronic kidney disease: Secondary | ICD-10-CM | POA: Diagnosis not present

## 2018-04-01 DIAGNOSIS — N186 End stage renal disease: Secondary | ICD-10-CM | POA: Diagnosis not present

## 2018-04-01 DIAGNOSIS — N2581 Secondary hyperparathyroidism of renal origin: Secondary | ICD-10-CM | POA: Diagnosis not present

## 2018-04-01 DIAGNOSIS — D689 Coagulation defect, unspecified: Secondary | ICD-10-CM | POA: Diagnosis not present

## 2018-04-02 ENCOUNTER — Encounter: Payer: Self-pay | Admitting: Internal Medicine

## 2018-04-02 ENCOUNTER — Ambulatory Visit: Payer: Self-pay

## 2018-04-02 ENCOUNTER — Encounter (INDEPENDENT_AMBULATORY_CARE_PROVIDER_SITE_OTHER): Payer: Medicare Other | Admitting: Ophthalmology

## 2018-04-03 DIAGNOSIS — N2581 Secondary hyperparathyroidism of renal origin: Secondary | ICD-10-CM | POA: Diagnosis not present

## 2018-04-03 DIAGNOSIS — R079 Chest pain, unspecified: Secondary | ICD-10-CM | POA: Diagnosis not present

## 2018-04-03 DIAGNOSIS — N186 End stage renal disease: Secondary | ICD-10-CM | POA: Diagnosis not present

## 2018-04-03 DIAGNOSIS — D689 Coagulation defect, unspecified: Secondary | ICD-10-CM | POA: Diagnosis not present

## 2018-04-03 DIAGNOSIS — D631 Anemia in chronic kidney disease: Secondary | ICD-10-CM | POA: Diagnosis not present

## 2018-04-03 DIAGNOSIS — R51 Headache: Secondary | ICD-10-CM | POA: Diagnosis not present

## 2018-04-03 DIAGNOSIS — D509 Iron deficiency anemia, unspecified: Secondary | ICD-10-CM | POA: Diagnosis not present

## 2018-04-04 DIAGNOSIS — R0602 Shortness of breath: Secondary | ICD-10-CM | POA: Diagnosis not present

## 2018-04-04 DIAGNOSIS — R069 Unspecified abnormalities of breathing: Secondary | ICD-10-CM | POA: Diagnosis not present

## 2018-04-04 DIAGNOSIS — I491 Atrial premature depolarization: Secondary | ICD-10-CM | POA: Diagnosis not present

## 2018-04-04 DIAGNOSIS — I10 Essential (primary) hypertension: Secondary | ICD-10-CM | POA: Diagnosis not present

## 2018-04-06 DIAGNOSIS — D631 Anemia in chronic kidney disease: Secondary | ICD-10-CM | POA: Diagnosis not present

## 2018-04-06 DIAGNOSIS — R079 Chest pain, unspecified: Secondary | ICD-10-CM | POA: Diagnosis not present

## 2018-04-06 DIAGNOSIS — D689 Coagulation defect, unspecified: Secondary | ICD-10-CM | POA: Diagnosis not present

## 2018-04-06 DIAGNOSIS — D509 Iron deficiency anemia, unspecified: Secondary | ICD-10-CM | POA: Diagnosis not present

## 2018-04-06 DIAGNOSIS — I150 Renovascular hypertension: Secondary | ICD-10-CM | POA: Diagnosis not present

## 2018-04-06 DIAGNOSIS — N186 End stage renal disease: Secondary | ICD-10-CM | POA: Diagnosis not present

## 2018-04-06 DIAGNOSIS — N2581 Secondary hyperparathyroidism of renal origin: Secondary | ICD-10-CM | POA: Diagnosis not present

## 2018-04-06 DIAGNOSIS — R51 Headache: Secondary | ICD-10-CM | POA: Diagnosis not present

## 2018-04-06 DIAGNOSIS — Z992 Dependence on renal dialysis: Secondary | ICD-10-CM | POA: Diagnosis not present

## 2018-04-07 ENCOUNTER — Other Ambulatory Visit: Payer: Self-pay

## 2018-04-07 ENCOUNTER — Ambulatory Visit (INDEPENDENT_AMBULATORY_CARE_PROVIDER_SITE_OTHER): Payer: Medicare Other | Admitting: Vascular Surgery

## 2018-04-07 ENCOUNTER — Encounter: Payer: Self-pay | Admitting: Vascular Surgery

## 2018-04-07 VITALS — BP 153/77 | HR 98 | Temp 98.0°F | Resp 16 | Ht 63.0 in | Wt 131.5 lb

## 2018-04-07 DIAGNOSIS — N186 End stage renal disease: Secondary | ICD-10-CM

## 2018-04-07 NOTE — Progress Notes (Signed)
Patient name: Nancy [name] MRN: [id number] DOB: Apr 24, 1941 Sex: female  REASON FOR VISIT: Concern for infection of left brachiobasilic AV fistula.  HPI: Nancy [name] is a 77 y.o. female with history of hypertension, end-stage renal disease, CHF that presents for evaluation of her left arm access with concern for possible underlying infection (per the referral).  Patient presents with her granddaughter who states about a month ago she had some skin breakdown over her left brachiobasilic fistula in her left arm.  The fistula was initially placed by Dr. Donnetta Hutching as a first stage on 02/19/17 and then second stage was done on 04/23/17.  She had a temporary tunneled catheter placed in the left IJ that she has been using.  According to both the patient and her granddaughter the skin over top of the fistula has healed.  She has had no bleeding events over the last month.  They have not been accessing the fistula so it is hard to know whether or not they have had any difficulty.  Sound like her catheter has been working fine.  Past Medical History:  Diagnosis Date  . Anemia   . Aortic stenosis   . Bacterial sinusitis 09/17/2011  . CHF (congestive heart failure) ([place])   . CKD (chronic kidney disease) stage 4, GFR 15-29 ml/min ([place]) 08/11/2006   Cr continues to increase. Proteinuria on UA 02/10/12.    . Colitis   . CVA (cerebrovascular accident) [facility])    New hemorrhagic per CT scan '09  . Diverticulosis of colon   . Dysfunctional uterine bleeding   . ESRD (end stage renal disease) on dialysis ([place])    "MWF; [facility]" (11/27/2017)  . Fecal impaction ([place])   . Headache(784.0)   . Heart murmur   . HERNIORRHAPHY, HX OF 08/11/2006  . Hypertension   . OA (osteoarthritis)    bilateral knees  . Postmenopausal   . Pulmonary nodule   . TINEA CRURIS 01/12/2007    Past Surgical History:  Procedure Laterality Date  . ABDOMINAL HYSTERECTOMY    . AV FISTULA PLACEMENT Left 02/19/2017   Procedure: CREATION OF  LEFT ARM BRACHIOCEPHALIC ARTERIOVENOUS (AV) FISTULA;  Surgeon: Rosetta Posner, MD;  Location: [place];  Service: Vascular;  Laterality: Left;  . BASCILIC VEIN TRANSPOSITION Left 04/23/2017   Procedure: BASILIC VEIN TRANSPOSITION SECOND STAGE;  Surgeon: Rosetta Posner, MD;  Location: [place];  Service: Vascular;  Laterality: Left;  . BIOPSY  02/20/2018   Procedure: BIOPSY;  Surgeon: Carol Ada, MD;  Location: [facility];  Service: Endoscopy;;  . BIOPSY  03/07/2018   Procedure: BIOPSY;  Surgeon: Carol Ada, MD;  Location: [facility] ENDOSCOPY;  Service: Endoscopy;;  . CHOLECYSTECTOMY  2009  . COLONOSCOPY    . ESOPHAGOGASTRODUODENOSCOPY N/A 03/07/2018   Procedure: ESOPHAGOGASTRODUODENOSCOPY (EGD);  Surgeon: Carol Ada, MD;  Location: [place];  Service: Endoscopy;  Laterality: N/A;  . ESOPHAGOGASTRODUODENOSCOPY (EGD) WITH PROPOFOL N/A 02/20/2018   Procedure: ESOPHAGOGASTRODUODENOSCOPY (EGD) WITH PROPOFOL;  Surgeon: Carol Ada, MD;  [facility];  Service: Endoscopy;  Laterality: N/A;  . INGUINAL HERNIA REPAIR  2008  . INSERTION OF DIALYSIS CATHETER Right 02/19/2017   Procedure: INSERTION OF TUNNELED DIALYSIS CATHETER - RIGHT INTERNAL JUGULAR PLACEMENT;  Surgeon: Rosetta Posner, MD;  Location: [place];  Service: Vascular;  Laterality: Right;  . IRIDOTOMY / IRIDECTOMY     Laser, right eye 12/26/11 left eye 01/24/12  . MASS EXCISION Left 05/07/2013   Procedure: EXCISION CYST;  Surgeon: Melanie Crazier  Bobbye Charleston., MD;  Location: [facility];  Service: Ophthalmology;  Laterality: Left;  . SAVORY DILATION N/A 02/20/2018   Procedure: SAVORY DILATION;  Surgeon: Carol Ada, MD;  [facility] ENDOS[facility];  Service: Endoscopy;  Laterality: N/A;    Family History  Problem Relation Age of Onset  . Hypertension Mother   . Congestive Heart Failure Mother   . Heart attack Brother 34    SOCIAL HISTORY: Social History   Tobacco Use  . Smoking status: Never Smoker  . Smokeless tobacco:  Never Used  Substance Use Topics  . Alcohol use: No    Alcohol/week: 0.0 standard drinks    Allergies  Allergen Reactions  . Hydrocodone Nausea And Vomiting and Other (See Comments)    Dizziness, also    Current Outpatient Medications  Medication Sig Dispense Refill  . diclofenac sodium (VOLTAREN) 1 % GEL Apply 4 g topically 4 (four) times daily. (Patient taking differently: Apply 4 g topically every other day. ) 1 Tube 2  . aspirin EC 81 MG EC tablet Take 1 tablet (81 mg total) by mouth daily. (Patient not taking: Reported on 04/07/2018) 30 tablet 1  . calcitRIOL (ROCALTROL) 0.5 MCG capsule Take 4 capsules (2 mcg total) by mouth every Monday, Wednesday, and Friday with hemodialysis. (Patient not taking: Reported on 04/07/2018)    . hydrocortisone cream 0.5 % Apply 1 application topically 2 (two) times daily. (Patient not taking: Reported on 03/29/2018) 30 g 0  . mirtazapine (REMERON) 15 MG tablet Take 1 tablet (15 mg total) by mouth at bedtime. (Patient not taking: Reported on 03/29/2018) 30 tablet 2  . nitroGLYCERIN (NITROSTAT) 0.3 MG SL tablet Place 1 tablet (0.3 mg total) under the tongue every 5 (five) minutes as needed for chest pain. (Patient not taking: Reported on 04/07/2018) 30 tablet 0  . pantoprazole (PROTONIX) 40 MG tablet Take 1 tablet (40 mg total) by mouth daily. (Patient taking differently: Take 40 mg by mouth daily as needed (acid reflux). ) 30 tablet 0  . promethazine (PHENERGAN) 12.5 MG tablet Take 1 tablet (12.5 mg total) by mouth every 6 (six) hours as needed for nausea or vomiting (Take 1 prior to dialysis). Take 1 tablet half an hour before meal. (Patient not taking: Reported on 04/07/2018) 30 tablet 0  . sucroferric oxyhydroxide (VELPHORO) 500 MG chewable tablet Chew 1,500 mg by mouth 3 (three) times daily with meals.      No current facility-administered medications for this visit.     REVIEW OF SYSTEMS:  [X]  denotes positive finding, [ ]  denotes negative  finding Cardiac  Comments:  Chest pain or chest pressure:    Shortness of breath upon exertion:    Short of breath when lying flat:    Irregular heart rhythm:        Vascular    Pain in calf, thigh, or hip brought on by ambulation:    Pain in feet at night that wakes you up from your sleep:     Blood clot in your veins:    Leg swelling:         Pulmonary    Oxygen at home:    Productive cough:     Wheezing:         Neurologic    Sudden weakness in arms or legs:     Sudden numbness in arms or legs:     Sudden onset of difficulty speaking or slurred speech:    Temporary loss of vision in one eye:  Problems with dizziness:         Gastrointestinal    Blood in stool:     Vomited blood:         Genitourinary    Burning when urinating:     Blood in urine:        Psychiatric    Major depression:         Hematologic    Bleeding problems:    Problems with blood clotting too easily:        Skin    Rashes or ulcers:    Some skin breakdown over fistula, now healed x   Constitutional    Fever or chills:      PHYSICAL EXAM: Vitals:   04/07/18 0847  BP: (!) 153/77  Pulse: 98  Resp: 16  Temp: 98 F (36.7 C)  TempSrc: Oral  SpO2: 98%  Weight: 59.6 kg  Height: 5\' 3"  (1.6 m)    GENERAL: The patient is a well-nourished female, in no acute distress. The vital signs are documented above. CARDIAC: There is a regular rate and rhythm.  VASCULAR:  2+ radial pulse palpable BUE Good thrill over left brachiobasilic fistula Skin thickening over left arm fistula, no overt skin breakdown, no ulcer PULMONARY: There is good air exchange bilaterally without wheezing or rales. MUSCULOSKELETAL: There are no major deformities or cyanosis. PSYCHIATRIC: The patient has a normal affect.  DATA:   No imaging to review  Assessment/Plan:  After reviewing a photo that the granddaughter had on her phone showing skin breakdown over the fistula about a month ago, it appears that all  this has healed accordingly.  On my evaluation she has a great thrill in the left upper extremity brachiobasilic fistula.  She has a fair amount of skin thickening over the fistula but no areas that appear to be high risk for bleeding at this point.  I discussed that I would be fine with dialysis attempting to access her fistula again to see how it does prior to any attempt at revision given its appearance today.  I see no signs of infection.  She will contact our office if questions or concerns develop.   Marty Heck, MD [facility] Office: [phone] Pager: [place]

## 2018-04-08 DIAGNOSIS — N2581 Secondary hyperparathyroidism of renal origin: Secondary | ICD-10-CM | POA: Diagnosis not present

## 2018-04-08 DIAGNOSIS — Z23 Encounter for immunization: Secondary | ICD-10-CM | POA: Diagnosis not present

## 2018-04-08 DIAGNOSIS — D689 Coagulation defect, unspecified: Secondary | ICD-10-CM | POA: Diagnosis not present

## 2018-04-08 DIAGNOSIS — N186 End stage renal disease: Secondary | ICD-10-CM | POA: Diagnosis not present

## 2018-04-08 DIAGNOSIS — D509 Iron deficiency anemia, unspecified: Secondary | ICD-10-CM | POA: Diagnosis not present

## 2018-04-08 DIAGNOSIS — R51 Headache: Secondary | ICD-10-CM | POA: Diagnosis not present

## 2018-04-14 ENCOUNTER — Encounter (INDEPENDENT_AMBULATORY_CARE_PROVIDER_SITE_OTHER): Payer: Medicare Other | Admitting: Ophthalmology

## 2018-04-14 DIAGNOSIS — H35033 Hypertensive retinopathy, bilateral: Secondary | ICD-10-CM | POA: Diagnosis not present

## 2018-04-14 DIAGNOSIS — I10 Essential (primary) hypertension: Secondary | ICD-10-CM

## 2018-04-14 DIAGNOSIS — H3552 Pigmentary retinal dystrophy: Secondary | ICD-10-CM

## 2018-04-14 DIAGNOSIS — H43813 Vitreous degeneration, bilateral: Secondary | ICD-10-CM | POA: Diagnosis not present

## 2018-04-14 DIAGNOSIS — H2513 Age-related nuclear cataract, bilateral: Secondary | ICD-10-CM | POA: Diagnosis not present

## 2018-04-15 DIAGNOSIS — N2581 Secondary hyperparathyroidism of renal origin: Secondary | ICD-10-CM | POA: Diagnosis not present

## 2018-04-15 DIAGNOSIS — D509 Iron deficiency anemia, unspecified: Secondary | ICD-10-CM | POA: Diagnosis not present

## 2018-04-15 DIAGNOSIS — R51 Headache: Secondary | ICD-10-CM | POA: Diagnosis not present

## 2018-04-15 DIAGNOSIS — Z23 Encounter for immunization: Secondary | ICD-10-CM | POA: Diagnosis not present

## 2018-04-15 DIAGNOSIS — D689 Coagulation defect, unspecified: Secondary | ICD-10-CM | POA: Diagnosis not present

## 2018-04-15 DIAGNOSIS — N186 End stage renal disease: Secondary | ICD-10-CM | POA: Diagnosis not present

## 2018-04-17 ENCOUNTER — Encounter (HOSPITAL_COMMUNITY): Payer: Self-pay | Admitting: *Deleted

## 2018-04-17 ENCOUNTER — Other Ambulatory Visit: Payer: Self-pay

## 2018-04-17 ENCOUNTER — Inpatient Hospital Stay (HOSPITAL_COMMUNITY)
Admission: EM | Admit: 2018-04-17 | Discharge: 2018-04-20 | DRG: 177 | Disposition: A | Discharge status: Home / Self Care | Payer: Medicare Other | Attending: Internal Medicine | Admitting: Internal Medicine

## 2018-04-17 ENCOUNTER — Emergency Department (HOSPITAL_COMMUNITY): Payer: Medicare Other

## 2018-04-17 DIAGNOSIS — R0602 Shortness of breath: Secondary | ICD-10-CM | POA: Diagnosis not present

## 2018-04-17 DIAGNOSIS — Z66 Do not resuscitate: Secondary | ICD-10-CM | POA: Diagnosis present

## 2018-04-17 DIAGNOSIS — I120 Hypertensive chronic kidney disease with stage 5 chronic kidney disease or end stage renal disease: Secondary | ICD-10-CM | POA: Diagnosis not present

## 2018-04-17 DIAGNOSIS — I493 Ventricular premature depolarization: Secondary | ICD-10-CM | POA: Diagnosis not present

## 2018-04-17 DIAGNOSIS — I132 Hypertensive heart and chronic kidney disease with heart failure and with stage 5 chronic kidney disease, or end stage renal disease: Secondary | ICD-10-CM | POA: Diagnosis not present

## 2018-04-17 DIAGNOSIS — Z23 Encounter for immunization: Secondary | ICD-10-CM | POA: Diagnosis not present

## 2018-04-17 DIAGNOSIS — J690 Pneumonitis due to inhalation of food and vomit: Secondary | ICD-10-CM | POA: Diagnosis not present

## 2018-04-17 DIAGNOSIS — D509 Iron deficiency anemia, unspecified: Secondary | ICD-10-CM | POA: Diagnosis not present

## 2018-04-17 DIAGNOSIS — N2581 Secondary hyperparathyroidism of renal origin: Secondary | ICD-10-CM | POA: Diagnosis not present

## 2018-04-17 DIAGNOSIS — L821 Other seborrheic keratosis: Secondary | ICD-10-CM | POA: Diagnosis not present

## 2018-04-17 DIAGNOSIS — M170 Bilateral primary osteoarthritis of knee: Secondary | ICD-10-CM | POA: Diagnosis present

## 2018-04-17 DIAGNOSIS — R51 Headache: Secondary | ICD-10-CM | POA: Diagnosis not present

## 2018-04-17 DIAGNOSIS — K219 Gastro-esophageal reflux disease without esophagitis: Secondary | ICD-10-CM | POA: Diagnosis not present

## 2018-04-17 DIAGNOSIS — Z91048 Other nonmedicinal substance allergy status: Secondary | ICD-10-CM

## 2018-04-17 DIAGNOSIS — Z743 Need for continuous supervision: Secondary | ICD-10-CM | POA: Diagnosis not present

## 2018-04-17 DIAGNOSIS — R918 Other nonspecific abnormal finding of lung field: Secondary | ICD-10-CM | POA: Diagnosis not present

## 2018-04-17 DIAGNOSIS — R238 Other skin changes: Secondary | ICD-10-CM | POA: Diagnosis not present

## 2018-04-17 DIAGNOSIS — E872 Acidosis: Secondary | ICD-10-CM | POA: Diagnosis not present

## 2018-04-17 DIAGNOSIS — I5030 Unspecified diastolic (congestive) heart failure: Secondary | ICD-10-CM | POA: Diagnosis present

## 2018-04-17 DIAGNOSIS — Z885 Allergy status to narcotic agent status: Secondary | ICD-10-CM | POA: Diagnosis not present

## 2018-04-17 DIAGNOSIS — Z7982 Long term (current) use of aspirin: Secondary | ICD-10-CM

## 2018-04-17 DIAGNOSIS — K449 Diaphragmatic hernia without obstruction or gangrene: Secondary | ICD-10-CM | POA: Diagnosis present

## 2018-04-17 DIAGNOSIS — Z79899 Other long term (current) drug therapy: Secondary | ICD-10-CM | POA: Diagnosis not present

## 2018-04-17 DIAGNOSIS — H547 Unspecified visual loss: Secondary | ICD-10-CM | POA: Diagnosis not present

## 2018-04-17 DIAGNOSIS — A419 Sepsis, unspecified organism: Secondary | ICD-10-CM | POA: Diagnosis not present

## 2018-04-17 DIAGNOSIS — D631 Anemia in chronic kidney disease: Secondary | ICD-10-CM | POA: Diagnosis present

## 2018-04-17 DIAGNOSIS — R011 Cardiac murmur, unspecified: Secondary | ICD-10-CM | POA: Diagnosis not present

## 2018-04-17 DIAGNOSIS — I350 Nonrheumatic aortic (valve) stenosis: Secondary | ICD-10-CM | POA: Diagnosis not present

## 2018-04-17 DIAGNOSIS — Z992 Dependence on renal dialysis: Secondary | ICD-10-CM | POA: Diagnosis not present

## 2018-04-17 DIAGNOSIS — D689 Coagulation defect, unspecified: Secondary | ICD-10-CM | POA: Diagnosis not present

## 2018-04-17 DIAGNOSIS — R05 Cough: Secondary | ICD-10-CM | POA: Diagnosis not present

## 2018-04-17 DIAGNOSIS — R053 Chronic cough: Secondary | ICD-10-CM | POA: Diagnosis present

## 2018-04-17 DIAGNOSIS — R079 Chest pain, unspecified: Secondary | ICD-10-CM | POA: Diagnosis not present

## 2018-04-17 DIAGNOSIS — R069 Unspecified abnormalities of breathing: Secondary | ICD-10-CM | POA: Diagnosis not present

## 2018-04-17 DIAGNOSIS — R000 Tachycardia, unspecified: Secondary | ICD-10-CM | POA: Diagnosis not present

## 2018-04-17 DIAGNOSIS — N186 End stage renal disease: Secondary | ICD-10-CM | POA: Diagnosis present

## 2018-04-17 DIAGNOSIS — R059 Cough, unspecified: Secondary | ICD-10-CM

## 2018-04-17 DIAGNOSIS — Z8673 Personal history of transient ischemic attack (TIA), and cerebral infarction without residual deficits: Secondary | ICD-10-CM | POA: Diagnosis not present

## 2018-04-17 DIAGNOSIS — R509 Fever, unspecified: Secondary | ICD-10-CM | POA: Diagnosis not present

## 2018-04-17 DIAGNOSIS — R1111 Vomiting without nausea: Secondary | ICD-10-CM | POA: Diagnosis not present

## 2018-04-17 LAB — CBC WITH DIFFERENTIAL/PLATELET
Abs Immature Granulocytes: 0.04 10*3/uL (ref 0.00–0.07)
Basophils Absolute: 0 10*3/uL (ref 0.0–0.1)
Basophils Relative: 0 %
EOS PCT: 1 %
Eosinophils Absolute: 0.1 10*3/uL (ref 0.0–0.5)
HEMATOCRIT: 39.3 % (ref 36.0–46.0)
Hemoglobin: 11.4 g/dL — ABNORMAL LOW (ref 12.0–15.0)
Immature Granulocytes: 1 %
LYMPHS ABS: 0.5 10*3/uL — AB (ref 0.7–4.0)
LYMPHS PCT: 7 %
MCH: 28.8 pg (ref 26.0–34.0)
MCHC: 29 g/dL — AB (ref 30.0–36.0)
MCV: 99.2 fL (ref 80.0–100.0)
MONO ABS: 0.3 10*3/uL (ref 0.1–1.0)
Monocytes Relative: 4 %
Neutro Abs: 6.1 10*3/uL (ref 1.7–7.7)
Neutrophils Relative %: 87 %
Platelets: 131 10*3/uL — ABNORMAL LOW (ref 150–400)
RBC: 3.96 MIL/uL (ref 3.87–5.11)
RDW: 16.3 % — ABNORMAL HIGH (ref 11.5–15.5)
WBC: 7.1 10*3/uL (ref 4.0–10.5)
nRBC: 0 % (ref 0.0–0.2)

## 2018-04-17 LAB — INFLUENZA PANEL BY PCR (TYPE A & B)
Influenza A By PCR: NEGATIVE
Influenza B By PCR: NEGATIVE

## 2018-04-17 LAB — COMPREHENSIVE METABOLIC PANEL
ALK PHOS: 48 U/L (ref 38–126)
ALT: 8 U/L (ref 0–44)
ANION GAP: 10 (ref 5–15)
AST: 14 U/L — ABNORMAL LOW (ref 15–41)
Albumin: 3.1 g/dL — ABNORMAL LOW (ref 3.5–5.0)
BILIRUBIN TOTAL: 1 mg/dL (ref 0.3–1.2)
BUN: 9 mg/dL (ref 8–23)
CALCIUM: 8.9 mg/dL (ref 8.9–10.3)
CO2: 31 mmol/L (ref 22–32)
Chloride: 98 mmol/L (ref 98–111)
Creatinine, Ser: 4.77 mg/dL — ABNORMAL HIGH (ref 0.44–1.00)
GFR, EST AFRICAN AMERICAN: 9 mL/min — AB (ref >60)
GFR, EST NON AFRICAN AMERICAN: 8 mL/min — AB (ref >60)
GLUCOSE: 95 mg/dL (ref 70–99)
Potassium: 3.4 mmol/L — ABNORMAL LOW (ref 3.5–5.1)
Sodium: 139 mmol/L (ref 135–145)
TOTAL PROTEIN: 6.2 g/dL — AB (ref 6.5–8.1)

## 2018-04-17 LAB — I-STAT CG4 LACTIC ACID, ED: Lactic Acid, Venous: 2.3 mmol/L (ref 0.5–1.9)

## 2018-04-17 MED ORDER — SODIUM CHLORIDE 0.9 % IV SOLN
2.0000 g | Freq: Once | INTRAVENOUS | Status: AC
  Administered 2018-04-17: 2 g via INTRAVENOUS
  Filled 2018-04-17: qty 2

## 2018-04-17 MED ORDER — ASPIRIN EC 81 MG PO TBEC
81.0000 mg | DELAYED_RELEASE_TABLET | Freq: Every day | ORAL | Status: DC
  Administered 2018-04-19 – 2018-04-20 (×2): 81 mg via ORAL
  Filled 2018-04-17 (×3): qty 1

## 2018-04-17 MED ORDER — PANTOPRAZOLE SODIUM 40 MG PO TBEC
40.0000 mg | DELAYED_RELEASE_TABLET | Freq: Every day | ORAL | Status: DC
  Administered 2018-04-18 – 2018-04-20 (×3): 40 mg via ORAL
  Filled 2018-04-17 (×3): qty 1

## 2018-04-17 MED ORDER — VANCOMYCIN HCL IN DEXTROSE 500-5 MG/100ML-% IV SOLN: 500.0000 mg | INTRAVENOUS | Status: DC

## 2018-04-17 MED ORDER — VANCOMYCIN HCL IN DEXTROSE 1-5 GM/200ML-% IV SOLN
1000.0000 mg | Freq: Once | INTRAVENOUS | Status: AC
  Administered 2018-04-17: 1000 mg via INTRAVENOUS
  Filled 2018-04-17: qty 200

## 2018-04-17 MED ORDER — SODIUM CHLORIDE 0.9 % IV SOLN: 2.0000 g | INTRAVENOUS | Status: DC

## 2018-04-17 MED ORDER — CALCITRIOL 0.5 MCG PO CAPS
2.0000 ug | ORAL_CAPSULE | ORAL | Status: DC
  Administered 2018-04-20: 2 ug via ORAL

## 2018-04-17 MED ORDER — PROMETHAZINE HCL 25 MG PO TABS
12.5000 mg | ORAL_TABLET | Freq: Four times a day (QID) | ORAL | Status: DC | PRN
  Administered 2018-04-19: 12.5 mg via ORAL
  Filled 2018-04-17: qty 1

## 2018-04-17 MED ORDER — SUCROFERRIC OXYHYDROXIDE 500 MG PO CHEW
1500.0000 mg | CHEWABLE_TABLET | Freq: Three times a day (TID) | ORAL | Status: DC
  Filled 2018-04-17 (×9): qty 3

## 2018-04-17 MED ORDER — SODIUM CHLORIDE 0.9 % IV SOLN: Freq: Once | INTRAVENOUS | Status: DC

## 2018-04-17 MED ORDER — ACETAMINOPHEN 325 MG PO TABS: 650.0000 mg | ORAL_TABLET | Freq: Once | ORAL | Status: DC

## 2018-04-17 MED ORDER — ACETAMINOPHEN 325 MG PO TABS: 650.0000 mg | ORAL_TABLET | Freq: Four times a day (QID) | ORAL | Status: DC | PRN

## 2018-04-17 MED ORDER — GUAIFENESIN 100 MG/5ML PO SOLN
5.0000 mL | ORAL | Status: DC | PRN
  Administered 2018-04-18 (×2): 100 mg via ORAL
  Filled 2018-04-17 (×3): qty 5

## 2018-04-17 MED ORDER — HEPARIN SODIUM (PORCINE) 5000 UNIT/ML IJ SOLN
5000.0000 [IU] | Freq: Three times a day (TID) | INTRAMUSCULAR | Status: DC
  Administered 2018-04-18: 5000 [IU] via SUBCUTANEOUS
  Filled 2018-04-17 (×2): qty 1

## 2018-04-17 MED ORDER — ACETAMINOPHEN 650 MG RE SUPP: 650.0000 mg | Freq: Four times a day (QID) | RECTAL | Status: DC | PRN

## 2018-04-17 NOTE — H&P (Signed)
Date: 04/17/2018               Patient Name:  Nancy [name] MRN: [id number]  DOB: December 18, 1940 Age / Sex: 77 y.o., female   PCP: Aldine Contes, MD         Medical Service: Internal Medicine Teaching Service         Attending Physician: Dr. Lennice Sites, DO    First Contact: Dr. Linna Hoff Pager: [phone]  Second Contact: Dr. Ina Homes Pager: [phone]       After Hours (After 5p/  First Contact Pager: [phone]  weekends / holidays): Second Contact Pager: [phone]   Chief Complaint: Cough  History of Present Illness:  Nancy [name] is a 77yo F w/ PMHx of ESRD on MWF HD, GERD, CVA, HTN, HFpEF presenting with complaints of productive cough with white sputum. She was examined and evaluated at ED bedside this PM with granddaughter present. She was in her usual state of health until Monday when she began to endorse dyspnea and cough. She states she has been having a chronic productive cough with whitish sputum starting for the last month. Cough usually occurs at rest and when laying supine. Her cough appears to have gotten worse over the last couple days. She mentions no significant change in color or quality of the sputum recently but granddaughter mentions that she was recently diagnosed with an unspecified vision disorder (thinks it may be retinitis pigmentosa?) and patient's description of her sputum may be inaccurate. She took some Mucinex and cough drops which minimally improved her symptoms. She was not planning on coming to the hospital but during dialysis this morning, she started endorsing significant nausea and 1 episodes of Non-bilious, non-bloody emesis and it was cut short and she was sent home. However, she began to have significant worsening of her cough with increase in frequency and quantity of sputum production and she came to the ED for evaluation. During history taking, she was observed coughing frequently with productive white sputum, especially when changing  positions. Of note, she states she received her flu vaccine this morning at dialysis.  In the ED, she was found to be febrile with lactic acidosis and was started on vancomycin and cefepime. Chest X-ray showed no lobar consolidation. Blood culture and respiratory viral panel were drawn.  Meds:  Amlodipine 10mg  daily Asa 81mg  daily Calcitriol 48mcg MWF Pantoprazole 40mg  daily Mirtazapine 15mg  qhs SL nitro 0.3mg  QD Sucroferric oxyhydroxide 1500mg  TID qc Voltaren Gel 1%   Allergies: Allergies as of 04/17/2018 - Review Complete 04/17/2018  Allergen Reaction Noted  . Hydrocodone Nausea And Vomiting and Other (See Comments) 12/26/2017   Past Medical History:  Diagnosis Date  . Anemia   . Aortic stenosis   . Bacterial sinusitis 09/17/2011  . CHF (congestive heart failure) ([place])   . CKD (chronic kidney disease) stage 4, GFR 15-29 ml/min ([place]) 08/11/2006   Cr continues to increase. Proteinuria on UA 02/10/12.    . Colitis   . CVA (cerebrovascular accident) [facility])    New hemorrhagic per CT scan '09  . Diverticulosis of colon   . Dysfunctional uterine bleeding   . ESRD (end stage renal disease) on dialysis ([place])    "MWF; [facility]" (11/27/2017)  . Fecal impaction ([place])   . Headache(784.0)   . Heart murmur   . HERNIORRHAPHY, HX OF 08/11/2006  . Hypertension   . OA (osteoarthritis)    bilateral knees  . Postmenopausal   . Pulmonary nodule   .  TINEA CRURIS 01/12/2007    Family History:  Family History  Problem Relation Age of Onset  . Hypertension Mother   . Congestive Heart Failure Mother   . Heart attack Brother 15   Social History:  She lives on her own. Used to work as a Sports coach but now retired. Able to perform all ADLs without assistance. Denies any EtOh, Tobacco, IVDU.  Review of Systems: A complete ROS was negative except as per HPI.  Physical Exam: Blood pressure (!) 157/75, pulse (!) 110, temperature (!) 101.9 F (38.8 C), temperature source Rectal, resp. rate 16,  height 5\' 2"  (1.575 m), weight 59.6 kg, SpO2 98 %. Physical Exam  Constitutional: She is oriented to person, place, and time and well-developed, well-nourished, and in no distress. No distress.  HENT:  Head: Normocephalic and atraumatic.  Mouth/Throat: Oropharynx is clear and moist.  Eyes: Pupils are equal, round, and reactive to light. EOM are normal.  Injected conjunctivae  Neck: Normal range of motion. Neck supple. No JVD present.  Cardiovascular: Normal rate, regular rhythm and intact distal pulses.  Murmur (4/6 systolic murmur) heard. +bruit on left arm AV fistula   Pulmonary/Chest: Effort normal and breath sounds normal. No respiratory distress. She has no rales.  Abdominal: Soft. Bowel sounds are normal. She exhibits no distension. There is no tenderness.  Musculoskeletal: Normal range of motion. She exhibits edema (1+ pitting edema around ankles).  Neurological: She is alert and oriented to person, place, and time. No cranial nerve deficit. GCS score is 15.  Skin: Skin is warm and dry. She is not diaphoretic.  IJ cath site with blood on bandaging and dried serosanguinous exudates around sutures. No surrounding edema, erythema, warmth    EKG: personally reviewed my interpretation is sinus tachycardia, normal axis, possible ST elevation on lateral leads, +PVCs. Wide QRS. No significant change from prior EKG.  CXR: personally reviewed my interpretation is Left IJ dialysis catheter tip in SVC/RA junction. Cardiac silhouette normal. CPA blunting. No lobar consolidation. No pulmonary edema.   Assessment & Plan by Problem: Nancy [name] is a 77yo F w/ PMHx of ESRD on MWF HD, GERD, CVA, HTN, HFpEF presenting with complaints of productive cough. While sputum and worsening at supine is consistent with pulmonary edema 2/2 CHF but fever and lactic acidosis is concerning for pneumonia or bronchitits. X-ray did not show significant lobar consolidation but she may have viral bronchitis or atypical  pneumonia. Will admit for empiric antibiotic therapy and observation of respiratory status.  Acute exacerbation of chronic cough 2/2 hypervolemia vs viral bronchitis vs atypical pneumonia Productive cough with white sputum. Febrile on admission 101.9. I-stat lactate 2.3 No leukocytosis. No obvious lobar consolidation or pulm edema on chest X-ray. Denies sick contact. Received flushot yesterday. Has had pneumococcal shot in the past.  - Trend CBC - F/u Respiratory panel - F/u Blood culture - C/w Vanc/Cefepime per pharmacy - Keep O2 sat above 88  - Droplet precaution - Holding additional fluid resuscitation as pt is ESRD  Hx of CVA and HFpEF - C/w aspirin 81mg  daily   ESRD on HD MWF Half-day dialysis this AM. K 3.4. Creatinine 4.77. No need for emergent dialysis at this time. Will need to be careful with any kind of fluid resuscitation. - Consult nephro in AM for inpatient dialysis scheduling - Phenergan for nausea - c/w home meds: calcitriol 47mcg MWF  Hx of GERD - c/w home med: pantoprazole 40mg  daily  DVT prophx: subqheparin Diet: Renal diet Bowel: Miralax Code: DNR,  DNI  Dispo: Admit patient to Inpatient with expected length of stay greater than 2 midnights.  Signed: Mosetta Anis, MD 04/17/2018, 8:27 PM  Pager: [phone]

## 2018-04-17 NOTE — ED Triage Notes (Signed)
The pt arrived by [facility] from home  She had one-half dialysis treatment earlier today and went home before completion  And call ed ems  She has a productive cough  Yellow sputum

## 2018-04-17 NOTE — ED Provider Notes (Addendum)
[facility] EMERGENCY DEPARTMENT Provider Note   CSN: [id number] Arrival date & time: 04/17/18  1700     History   Chief Complaint Chief Complaint  Patient presents with  . Cough    HPI Nancy [name] is a 77 y.o. female.  The history is provided by the patient.  Cough  This is a new problem. The current episode started more than 2 days ago. The problem occurs constantly. The problem has not changed since onset.The cough is productive of sputum. There has been no fever. The fever has been present for less than 1 day. Pertinent negatives include no chest pain, no chills, no sweats, no weight loss, no ear congestion, no ear pain, no headaches, no rhinorrhea, no sore throat, no myalgias, no shortness of breath, no wheezing and no eye redness. Treatments tried: cough drops. The treatment provided mild relief. She is not a smoker. Her past medical history does not include COPD or emphysema.    Past Medical History:  Diagnosis Date  . Anemia   . Aortic stenosis   . Bacterial sinusitis 09/17/2011  . CHF (congestive heart failure) ([place])   . CKD (chronic kidney disease) stage 4, GFR 15-29 ml/min ([place]) 08/11/2006   Cr continues to increase. Proteinuria on UA 02/10/12.    . Colitis   . CVA (cerebrovascular accident) [facility])    New hemorrhagic per CT scan '09  . Diverticulosis of colon   . Dysfunctional uterine bleeding   . ESRD (end stage renal disease) on dialysis ([place])    "MWF; [facility]" (11/27/2017)  . Fecal impaction ([place])   . Headache(784.0)   . Heart murmur   . HERNIORRHAPHY, HX OF 08/11/2006  . Hypertension   . OA (osteoarthritis)    bilateral knees  . Postmenopausal   . Pulmonary nodule   . TINEA CRURIS 01/12/2007    Patient Active Problem List   Diagnosis Date Noted  . Febrile illness 04/17/2018  . Acute gastritis without hemorrhage   . Duodenitis with bleeding   . ESRD (end stage renal disease) ([place])   . Pityriasis rosea 03/06/2018  . Acute on  chronic blood loss anemia 03/06/2018  . Protein-calorie malnutrition, severe 01/12/2018  . Midline thoracic back pain 12/23/2017  . Symptomatic anemia 02/17/2017  . Secondary hyperparathyroidism of renal origin ([place]) 01/15/2017  . Mitral valve annular calcification   . Dizziness 12/08/2016  . Angina at rest [facility])   . Goals of care, counseling/discussion   . Aortic stenosis 05/21/2016  . Anemia associated with chronic renal failure 02/01/2016  . Atherosclerosis of aorta ([place]) 01/11/2015  . End stage renal disease ([place]) 02/04/2013  . Non-intractable vomiting with nausea 08/17/2012  . Glaucoma 03/18/2012  . Health care maintenance 09/17/2011  . Osteoarthrosis involving lower leg 10/31/2008  . History of CVA (cerebrovascular accident) 01/28/2008  . Hyperlipidemia 02/13/2007  . PULMONARY NODULES 01/12/2007  . Left ventricular hypertrophy 09/02/2006  . Essential hypertension 08/11/2006  . GERD 08/11/2006    Past Surgical History:  Procedure Laterality Date  . ABDOMINAL HYSTERECTOMY    . AV FISTULA PLACEMENT Left 02/19/2017   Procedure: CREATION OF LEFT ARM BRACHIOCEPHALIC ARTERIOVENOUS (AV) FISTULA;  Surgeon: Rosetta Posner, MD;  Location: [facility];  Service: Vascular;  Laterality: Left;  . BASCILIC VEIN TRANSPOSITION Left 04/23/2017   Procedure: BASILIC VEIN TRANSPOSITION SECOND STAGE;  Surgeon: Rosetta Posner, MD;  Location: [facility];  Service: Vascular;  Laterality: Left;  . BIOPSY  02/20/2018   Procedure: BIOPSY;  Surgeon: Carol Ada, MD;  Location: [facility];  Service: Endoscopy;;  . BIOPSY  03/07/2018   Procedure: BIOPSY;  Surgeon: Carol Ada, MD;  Location: [facility] ENDOSCOPY;  Service: Endoscopy;;  . CHOLECYSTECTOMY  2009  . COLONOSCOPY    . ESOPHAGOGASTRODUODENOSCOPY N/A 03/07/2018   Procedure: ESOPHAGOGASTRODUODENOSCOPY (EGD);  Surgeon: Carol Ada, MD;  Location: [facility];  Service: Endoscopy;  Laterality: N/A;  . ESOPHAGOGASTRODUODENOSCOPY (EGD) WITH PROPOFOL N/A 02/20/2018     Procedure: ESOPHAGOGASTRODUODENOSCOPY (EGD) WITH PROPOFOL;  Surgeon: Carol Ada, MD;  Location: [facility] ENDOSCOPY;  Service: Endoscopy;  Laterality: N/A;  . INGUINAL HERNIA REPAIR  2008  . INSERTION OF DIALYSIS CATHETER Right 02/19/2017   Procedure: INSERTION OF TUNNELED DIALYSIS CATHETER - RIGHT INTERNAL JUGULAR PLACEMENT;  Surgeon: Rosetta Posner, MD;  Location: [facility];  Service: Vascular;  Laterality: Right;  . IRIDOTOMY / IRIDECTOMY     Laser, right eye 12/26/11 left eye 01/24/12  . MASS EXCISION Left 05/07/2013   Procedure: EXCISION CYST;  Surgeon: Myrtha Mantis., MD;  Location: [facility];  Service: Ophthalmology;  Laterality: Left;  . SAVORY DILATION N/A 02/20/2018   Procedure: SAVORY DILATION;  Surgeon: Carol Ada, MD;  Location: [facility] ENDOSCOPY;  Service: Endoscopy;  Laterality: N/A;     OB History   None      Home Medications    Prior to Admission medications   Medication Sig Start Date End Date Taking? Authorizing Provider  calcitRIOL (ROCALTROL) 0.5 MCG capsule Take 4 capsules (2 mcg total) by mouth every Monday, Wednesday, and Friday with hemodialysis. 12/30/17  Yes Neva Seat, MD  aspirin EC 81 MG EC tablet Take 1 tablet (81 mg total) by mouth daily. Patient not taking: Reported on 04/07/2018 12/10/16   Kalman Shan Ratliff, DO  diclofenac sodium (VOLTAREN) 1 % GEL Apply 4 g topically 4 (four) times daily. Patient taking differently: Apply 4 g topically every other day.  01/20/18   Mosetta Anis, MD  hydrocortisone cream 0.5 % Apply 1 application topically 2 (two) times daily. 03/05/18   Chundi, Verne Spurr, MD  latanoprost (XALATAN) 0.005 % ophthalmic solution Place 1 drop into both eyes at bedtime. 04/11/18   [provider]  mirtazapine (REMERON) 15 MG tablet Take 1 tablet (15 mg total) by mouth at bedtime. 02/01/18   [name], MD  nitroGLYCERIN (NITROSTAT) 0.3 MG SL tablet Place 1 tablet (0.3 mg total) under the tongue every 5 (five)  minutes as needed for chest pain. 02/27/17   Sid Falcon, MD  pantoprazole (PROTONIX) 40 MG tablet Take 1 tablet (40 mg total) by mouth daily. Patient taking differently: Take 40 mg by mouth daily as needed (acid reflux).  01/14/18 04/17/18  [name], MD  promethazine (PHENERGAN) 12.5 MG tablet Take 1 tablet (12.5 mg total) by mouth every 6 (six) hours as needed for nausea or vomiting (Take 1 prior to dialysis). Take 1 tablet half an hour before meal. 02/01/18   [name], MD  sucroferric oxyhydroxide (VELPHORO) 500 MG chewable tablet Chew 1,500 mg by mouth 3 (three) times daily with meals.     Rexene Agent, MD    Family History Family History  Problem Relation Age of Onset  . Hypertension Mother   . Congestive Heart Failure Mother   . Heart attack Brother 49    Social History Social History   Tobacco Use  . Smoking status: Never Smoker  . Smokeless tobacco: Never Used  Substance Use Topics  . Alcohol use:  No    Alcohol/week: 0.0 standard drinks  . Drug use: No    Comment: 08/15/08 UDS + cocaine     Allergies   Hydrocodone and Tape   Review of Systems Review of Systems  Constitutional: Negative for chills, fever and weight loss.  HENT: Negative for ear pain, rhinorrhea and sore throat.   Eyes: Negative for pain, redness and visual disturbance.  Respiratory: Positive for cough. Negative for shortness of breath and wheezing.   Cardiovascular: Negative for chest pain and palpitations.  Gastrointestinal: Negative for abdominal pain and vomiting.  Genitourinary: Negative for dysuria and hematuria.  Musculoskeletal: Negative for arthralgias, back pain and myalgias.  Skin: Negative for color change and rash.  Neurological: Negative for seizures, syncope and headaches.  All other systems reviewed and are negative.    Physical Exam Updated Vital Signs  ED Triage Vitals  Enc Vitals Group     BP --      Pulse --      Resp --      Temp --      Temp src --       SpO2 --      Weight 04/17/18 1719 131 lb 6.3 oz (59.6 kg)     Height 04/17/18 1719 5\' 2"  (1.575 m)     Head Circumference --      Peak Flow --      Pain Score 04/17/18 1717 4     Pain Loc --      Pain Edu? --      Excl. in [place]? --     Physical Exam  Constitutional: She is oriented to person, place, and time. She appears well-developed and well-nourished. No distress.  HENT:  Head: Normocephalic and atraumatic.  Eyes: Pupils are equal, round, and reactive to light. Conjunctivae and EOM are normal.  Neck: Normal range of motion. Neck supple.  Cardiovascular: Normal rate, regular rhythm, normal heart sounds and intact distal pulses.  No murmur heard. Pulmonary/Chest: Effort normal and breath sounds normal. No stridor. No respiratory distress. She has no wheezes.  Abdominal: Soft. Bowel sounds are normal. She exhibits no distension. There is no tenderness.  Musculoskeletal: Normal range of motion. She exhibits no edema.  Neurological: She is alert and oriented to person, place, and time.  Skin: Skin is warm and dry. Capillary refill takes less than 2 seconds. No rash noted.  Psychiatric: She has a normal mood and affect.  Nursing note and vitals reviewed.    ED Treatments / Results  Labs (all labs ordered are listed, but only abnormal results are displayed) Labs Reviewed  CBC WITH DIFFERENTIAL/PLATELET - Abnormal; Notable for the following components:      Result Value   Hemoglobin 11.4 (*)    MCHC 29.0 (*)    RDW 16.3 (*)    Platelets 131 (*)    Lymphs Abs 0.5 (*)    All other components within normal limits  COMPREHENSIVE METABOLIC PANEL - Abnormal; Notable for the following components:   Potassium 3.4 (*)    Creatinine, Ser 4.77 (*)    Total Protein 6.2 (*)    Albumin 3.1 (*)    AST 14 (*)    GFR calc non Af Amer 8 (*)    GFR calc Af Amer 9 (*)    All other components within normal limits  I-STAT CG4 LACTIC ACID, ED - Abnormal; Notable for the following components:    Lactic Acid, Venous 2.30 (*)    All other  components within normal limits  RESPIRATORY PANEL BY PCR  CULTURE, BLOOD (ROUTINE X 2)  CULTURE, BLOOD (ROUTINE X 2)  INFLUENZA PANEL BY PCR (TYPE A & B)  RENAL FUNCTION PANEL  CBC  LACTIC ACID, PLASMA    EKG EKG Interpretation  Date/Time:  Friday April 17 2018 17:15:05 EDT Ventricular Rate:  108 PR Interval:    QRS Duration: 112 QT Interval:  299 QTC Calculation: 401 R Axis:   15 Text Interpretation:  Sinus tachycardia Ventricular premature complex Borderline intraventricular conduction delay Repol abnrm suggests ischemia, lateral leads No significant change since Confirmed by Lennice Sites [phone]) on 04/17/2018 5:19:53 PM   Radiology Dg Chest 2 View  Result Date: 04/17/2018 CLINICAL DATA:  Cough. EXAM: CHEST - 2 VIEW COMPARISON:  Chest x-rays dated 03/28/2018 and 03/13/2018. FINDINGS: There is central pulmonary vascular congestion, similar to the previous exam. There is stable blunting of the RIGHT costophrenic angle, atelectasis and/or small pleural effusion. Questionable additional atelectasis and/or small pleural effusion at the LEFT costophrenic angle. Lungs otherwise clear. Mild cardiomegaly is stable. Atherosclerotic changes noted at the aortic arch. Dialysis catheter is stable in position. IMPRESSION: 1. Mild CHF/volume overload, similar to previous exams. Probable associated small pleural effusions bilaterally. 2. No evidence of pneumonia or alveolar pulmonary edema. 3.  Aortic Atherosclerosis (ICD10-I70.0). Electronically Signed   By: Franki Cabot M.D.   On: 04/17/2018 18:28    Procedures .Critical Care Performed by: Lennice Sites, DO Authorized by: Lennice Sites, DO   Critical care provider statement:    Critical care time (minutes):  45   Critical care time was exclusive of:  Separately billable procedures and treating other patients and teaching time   Critical care was necessary to treat or prevent imminent or  life-threatening deterioration of the following conditions:  Sepsis   Critical care was time spent personally by me on the following activities:  Development of treatment plan with patient or surrogate, discussions with consultants, discussions with primary provider, evaluation of patient's response to treatment, ordering and performing treatments and interventions, ordering and review of laboratory studies, ordering and review of radiographic studies, pulse oximetry, re-evaluation of patient's condition, ventilator management, obtaining history from patient or surrogate, examination of patient, review of old charts and blood draw for specimens   I assumed direction of critical care for this patient from another provider in my specialty: no     (including critical care time)  Medications Ordered in ED Medications  acetaminophen (TYLENOL) tablet 650 mg (650 mg Oral Not Given 04/17/18 1859)  vancomycin (VANCOCIN) IVPB 500 mg/100 ml premix (has no administration in time range)  ceFEPIme (MAXIPIME) 2 g in sodium chloride 0.9 % 100 mL IVPB (has no administration in time range)  aspirin EC tablet 81 mg (has no administration in time range)  calcitRIOL (ROCALTROL) capsule 2 mcg (has no administration in time range)  pantoprazole (PROTONIX) EC tablet 40 mg (has no administration in time range)  sucroferric oxyhydroxide (VELPHORO) chewable tablet 1,500 mg (has no administration in time range)  promethazine (PHENERGAN) tablet 12.5 mg (has no administration in time range)  heparin injection 5,000 Units (5,000 Units Subcutaneous Not Given 04/17/18 2200)  acetaminophen (TYLENOL) tablet 650 mg (has no administration in time range)    Or  acetaminophen (TYLENOL) suppository 650 mg (has no administration in time range)  guaiFENesin (ROBITUSSIN) 100 MG/5ML solution 100 mg (has no administration in time range)  ceFEPIme (MAXIPIME) 2 g in sodium chloride 0.9 % 100 mL IVPB (0 g  Intravenous Stopped 04/17/18 2305)    vancomycin (VANCOCIN) IVPB 1000 mg/200 mL premix (1,000 mg Intravenous New Bag/Given 04/17/18 2309)    EMERGENCY DEPARTMENT  US GUIDANCE EXAM Emergency Ultrasound:  US Guidance for Needle Guidance  INDICATIONS: Difficult vascular access Linear probe used in real-time to visualize location of needle entry through skin.   PERFORMED BY: Myself IMAGES ARCHIVED?: No, unable to obtain imaging due to need to secure IV in patient moving arm around LIMITATIONS: patient moving VIEWS USED: Transverse INTERPRETATION: Needle visualized within vein     Initial Impression / Assessment and Plan / ED Course  I have reviewed the triage vital signs and the nursing notes.  Pertinent labs & imaging results that were available during my care of the patient were reviewed by me and considered in my medical decision making (see chart for details).     Nancy [name] is a 77 year old female with history of heart failure, CKD, CVA who presents to the ED with cough, fever.  Patient with fever and tachycardia upon arrival.  Was made code sepsis.  Blood cultures, urine culture, chest x-ray obtained.  Patient with 3 days of cough and sputum production.  Denies any urinary symptoms.  Denies any abdominal pain.  No headache.  No signs of meningitis on exam.  Chest x-ray overall unremarkable.  Some signs of volume overload.  However after chest x-ray, does not appear to be flash pulmonary edema. Resting comfortably on RA.  Patient had elevated lactic acid.  Otherwise no significant leukocytosis, anemia.  Creatinine at baseline.  Cultures collected and IV antibiotics given.  Fluids were minimally given due to concern for volume overload as patient did have normal blood pressure. No concern for shock at this time. Possibly viral process. Was admitted to medicine for further infectious work-up. Hemodynamically improved throughout my care.  Likely also benefit from dialysis.  Blood gas unremarkable.  This chart was dictated  using voice recognition software.  Despite best efforts to proofread,  errors can occur which can change the documentation meaning.   Final Clinical Impressions(s) / ED Diagnoses   Final diagnoses:  Sepsis, due to unspecified organism, unspecified whether acute organ dysfunction present [facility])    ED Discharge Orders    None       Lennice Sites, DO 04/18/18 0129    Lennice Sites, DO 04/18/18 [id number]

## 2018-04-17 NOTE — ED Notes (Signed)
Iv team unable to get an iv

## 2018-04-17 NOTE — ED Notes (Signed)
2nd iv team nurse to do iv

## 2018-04-17 NOTE — ED Notes (Signed)
Report attempted unable  rn will call me back

## 2018-04-17 NOTE — Progress Notes (Signed)
Pharmacy Antibiotic Note  Nancy [name] is a 77 y.o. female admitted on 04/17/2018 with sepsis.  Pharmacy has been consulted for Cefepime and Vancomycin dosing. Pt w/ ESRD on HD MWF. Pt received half of dialysis session today and went home before completion. Pt w/ productive cough but CXR shows no evidence of pneumonia. WBC - 7.1, Tmax - 101.9, lactic acid - 2.30  Abx delayed due to IV access issues  Plan: Cefepime 2g x1, then Cefepime 2g qHD Vancomycin 1g x1, then Vancomycin 500mg  qHD Monitor and adjust per renal plans and per C&S  Height: 5\' 2"  (157.5 cm) Weight: 131 lb 6.3 oz (59.6 kg) IBW/kg (Calculated) : 50.1  Temp (24hrs), Avg:100.6 F (38.1 C), Min:99.3 F (37.4 C), Max:101.9 F (38.8 C)  No results for input(s): WBC, CREATININE, LATICACIDVEN, VANCOTROUGH, VANCOPEAK, VANCORANDOM, GENTTROUGH, GENTPEAK, GENTRANDOM, TOBRATROUGH, TOBRAPEAK, TOBRARND, AMIKACINPEAK, AMIKACINTROU, AMIKACIN in the last 168 hours.  Estimated Creatinine Clearance: 6.1 mL/min (A) (by C-G formula based on SCr of 6.3 mg/dL (H)).    Allergies  Allergen Reactions  . Hydrocodone Nausea And Vomiting and Other (See Comments)    Dizziness, also    Antimicrobials this admission: Cefepime 10/11 >>  Vancomycin 10/11 >>   Dose adjustments this admission: N/A  Microbiology results: Pending  Thank you for allowing pharmacy to be a part of this patient's care.  Tyson Babinski 04/17/2018 6:47 PM

## 2018-04-17 NOTE — ED Notes (Signed)
Pt asking how long before she leaves  No iv yet iv team coming.  Pt is dialysis pt does not void very often according to the pt

## 2018-04-17 NOTE — ED Notes (Signed)
Eating a sandwich

## 2018-04-18 ENCOUNTER — Other Ambulatory Visit: Payer: Self-pay

## 2018-04-18 DIAGNOSIS — Z7982 Long term (current) use of aspirin: Secondary | ICD-10-CM

## 2018-04-18 DIAGNOSIS — Z79899 Other long term (current) drug therapy: Secondary | ICD-10-CM

## 2018-04-18 DIAGNOSIS — Z8673 Personal history of transient ischemic attack (TIA), and cerebral infarction without residual deficits: Secondary | ICD-10-CM

## 2018-04-18 DIAGNOSIS — N186 End stage renal disease: Secondary | ICD-10-CM

## 2018-04-18 DIAGNOSIS — Z8249 Family history of ischemic heart disease and other diseases of the circulatory system: Secondary | ICD-10-CM

## 2018-04-18 DIAGNOSIS — R011 Cardiac murmur, unspecified: Secondary | ICD-10-CM

## 2018-04-18 DIAGNOSIS — K219 Gastro-esophageal reflux disease without esophagitis: Secondary | ICD-10-CM

## 2018-04-18 DIAGNOSIS — R05 Cough: Secondary | ICD-10-CM

## 2018-04-18 DIAGNOSIS — Z66 Do not resuscitate: Secondary | ICD-10-CM

## 2018-04-18 DIAGNOSIS — R509 Fever, unspecified: Secondary | ICD-10-CM

## 2018-04-18 DIAGNOSIS — R0602 Shortness of breath: Secondary | ICD-10-CM

## 2018-04-18 DIAGNOSIS — Z885 Allergy status to narcotic agent status: Secondary | ICD-10-CM

## 2018-04-18 DIAGNOSIS — I5030 Unspecified diastolic (congestive) heart failure: Secondary | ICD-10-CM

## 2018-04-18 DIAGNOSIS — Z992 Dependence on renal dialysis: Secondary | ICD-10-CM

## 2018-04-18 DIAGNOSIS — I132 Hypertensive heart and chronic kidney disease with heart failure and with stage 5 chronic kidney disease, or end stage renal disease: Secondary | ICD-10-CM

## 2018-04-18 DIAGNOSIS — H547 Unspecified visual loss: Secondary | ICD-10-CM

## 2018-04-18 LAB — RESPIRATORY PANEL BY PCR
ADENOVIRUS-RVPPCR: NOT DETECTED
Bordetella pertussis: NOT DETECTED
CHLAMYDOPHILA PNEUMONIAE-RVPPCR: NOT DETECTED
CORONAVIRUS 229E-RVPPCR: NOT DETECTED
CORONAVIRUS NL63-RVPPCR: NOT DETECTED
Coronavirus HKU1: NOT DETECTED
Coronavirus OC43: NOT DETECTED
INFLUENZA A H1-RVPPCR: NOT DETECTED
INFLUENZA A-RVPPCR: NOT DETECTED
INFLUENZA B-RVPPCR: NOT DETECTED
Influenza A H1 2009: NOT DETECTED
Influenza A H3: NOT DETECTED
Metapneumovirus: NOT DETECTED
Mycoplasma pneumoniae: NOT DETECTED
PARAINFLUENZA VIRUS 3-RVPPCR: NOT DETECTED
PARAINFLUENZA VIRUS 4-RVPPCR: NOT DETECTED
Parainfluenza Virus 1: NOT DETECTED
Parainfluenza Virus 2: NOT DETECTED
RESPIRATORY SYNCYTIAL VIRUS-RVPPCR: NOT DETECTED
Rhinovirus / Enterovirus: NOT DETECTED

## 2018-04-18 LAB — CBC
HCT: 34.8 % — ABNORMAL LOW (ref 36.0–46.0)
Hemoglobin: 10.1 g/dL — ABNORMAL LOW (ref 12.0–15.0)
MCH: 28.9 pg (ref 26.0–34.0)
MCHC: 29 g/dL — ABNORMAL LOW (ref 30.0–36.0)
MCV: 99.7 fL (ref 80.0–100.0)
NRBC: 0.2 % (ref 0.0–0.2)
PLATELETS: 120 10*3/uL — AB (ref 150–400)
RBC: 3.49 MIL/uL — AB (ref 3.87–5.11)
RDW: 16.4 % — AB (ref 11.5–15.5)
WBC: 8.1 10*3/uL (ref 4.0–10.5)

## 2018-04-18 LAB — RENAL FUNCTION PANEL
ANION GAP: 10 (ref 5–15)
Albumin: 2.6 g/dL — ABNORMAL LOW (ref 3.5–5.0)
BUN: 11 mg/dL (ref 8–23)
CALCIUM: 8.8 mg/dL — AB (ref 8.9–10.3)
CO2: 30 mmol/L (ref 22–32)
CREATININE: 5.14 mg/dL — AB (ref 0.44–1.00)
Chloride: 99 mmol/L (ref 98–111)
GFR calc non Af Amer: 7 mL/min — ABNORMAL LOW (ref >60)
GFR, EST AFRICAN AMERICAN: 9 mL/min — AB (ref >60)
Glucose, Bld: 100 mg/dL — ABNORMAL HIGH (ref 70–99)
PHOSPHORUS: 4.3 mg/dL (ref 2.5–4.6)
Potassium: 3.4 mmol/L — ABNORMAL LOW (ref 3.5–5.1)
SODIUM: 139 mmol/L (ref 135–145)

## 2018-04-18 LAB — LACTIC ACID, PLASMA: Lactic Acid, Venous: 1.3 mmol/L (ref 0.5–1.9)

## 2018-04-18 LAB — MRSA PCR SCREENING: MRSA BY PCR: NEGATIVE

## 2018-04-18 LAB — MAGNESIUM: MAGNESIUM: 1.9 mg/dL (ref 1.7–2.4)

## 2018-04-18 MED ORDER — CHLORHEXIDINE GLUCONATE CLOTH 2 % EX PADS: 6.0000 | MEDICATED_PAD | Freq: Every day | CUTANEOUS | Status: DC

## 2018-04-18 MED ORDER — HEPARIN SODIUM (PORCINE) 1000 UNIT/ML IJ SOLN
INTRAMUSCULAR | Status: AC
  Filled 2018-04-18: qty 4

## 2018-04-18 MED ORDER — DIPHENHYDRAMINE-ZINC ACETATE 2-0.1 % EX CREA
TOPICAL_CREAM | Freq: Three times a day (TID) | CUTANEOUS | Status: DC | PRN
  Administered 2018-04-19: 21:00:00 via TOPICAL
  Filled 2018-04-18: qty 28

## 2018-04-18 NOTE — Progress Notes (Signed)
   Subjective: Patient was seen and evaluated at bedside on morning rounds.  Still reports some shortness of breath when lying back in her bed.  But endorses that she feels better than when she came in.  We talked about the plan for continuing his antibiotic today and waiting for blood culture and probable hemodialysis today.  She is wondering if she can wait for hemodialysis till Monday.  We explained she will need not to delay it till Monday. Does not have any other complain.   Objective:  Vital signs in last 24 hours: Vitals:   04/18/18 0113 04/18/18 0539 04/18/18 0805 04/18/18 1002  BP: 137/73 (!) 152/72 137/69 125/78  Pulse: (!) 102 93 93 100  Resp: 20 20 18  (!) 24  Temp: 98.1 F (36.7 C) 98.3 F (36.8 C) (!) 97.3 F (36.3 C)   TempSrc: Oral Oral Oral   SpO2: 100% 93% 97% 99%  Weight:      Height:       General: Pleasant lady, sitting on the bed Head and neck: Partially blind CV: Systolic 3/6 murmur Chest and Lungs: Cath at left side. No drainage or bleeding. Lungs:  Mild bibasilar crackle. Abdomen: soft and non tender.  Extremities: No LEE. Nl pulses bilaterally, has AVF at left arm, which is intact. No erythema. No bleeding. Thrill is palpable Neurology: Alert and oriented x3. Normal mood and behavior   Assessment/Plan:  Active Problems:   Febrile illness  Nancy [name] is a 77yo F w/ PMHx of ESRD on MWF HD, GERD, CVA, HTN, HFpEF presenting with productive cough with white sputum. Also 4 kg weight gain.  Had fever, elevated lactic acid but no leucocytosis. Respiratory viral panel came back negative.  Productive cough, weight gain, nausea: CXR shows some increased interstitial marking but similar to previous CXR.  Mild bibasilar crackle on exam but otherwise no volume overload. Initial elevated lactic acid: 2.3 No leucocytosis.  Can be in setting of PNA vs hypervolemia. Cath infection is less likely with no leucocytosis. But it can likely be taken out. BC has sent and  patient started on Vanc and Cefepime at ED. BC negative so far -Continue Vanc and Cefipime and then change based on BC result -Follow up with BC result -Guaifenesin PRN -Trend Lactic acid-->trended down to 1.3 today  Hx of HFpEF and CVA: Last Echo 01/29/2018: EF: 09-32%, grade 1 diastolic dysfunction. Reports SOB with productive cough but no volume overload on exam -Continue ASA 80 mg QD   ESRD: Had incomplete Hd yesterday due to shortness of breath and cough. (finish 2 h of the total of 4 hrs) Has IJ cath and AVF at left arm.  patient reports Hx of bleeding at the site of cath.IJ cath does not have swelling or active bleeding. Left arm AVF has thrill. Looks intact. Patient with SOB but no major volume overload on exam. No nausea or vomiting. K and Bun stable. No indication for urgent HD. Plan to get it some time today. Will follow nephro recommendation.  -Probable HD today. Nephro consulted -C/w calcitriol -PRN Phenergan if nauseous   GERD: C/w Pantoprazole 40 mg QD  Diet: Renal IV fluid: None. Fluid restriction Code: DNR, DNI VTE ppx: Hep  Dispo: Anticipated discharge in 1-2 days if no sign of systemic infection.  Nancy Hatch, MD 04/18/2018, 10:57 AM Pager: [phone]

## 2018-04-18 NOTE — Consult Note (Signed)
Renal Service Consult Note [facility]  Nancy [name] 04/18/2018 Nancy [name] Requesting Physician:  Dr Joni Reining  Reason for Consult:  ESRD pt w/ SOB HPI: The patient is a 77 y.o. year-old presentign w/ SOB, coughing up white mucous , no fevers or chills.  Went to HD yesterday (Friday), only took 2 hrs on Wed and missed Monday because somebody was stealing her money out of her bank.  No n/v abd pain, appetite good. Mild chest pain over her hiatal hernia.  ESRD due to HTN , on HD 1 yr and 48mos.  Hx of HTN but it disappeared after HD started.    ROS  denies CP  no joint pain   no HA  no blurry vision  no rash  no diarrhea  no nausea/ vomiting     Past Medical History  Past Medical History:  Diagnosis Date  . Anemia   . Aortic stenosis   . Bacterial sinusitis 09/17/2011  . CHF (congestive heart failure) ([place])   . CKD (chronic kidney disease) stage 4, GFR 15-29 ml/min ([place]) 08/11/2006   Cr continues to increase. Proteinuria on UA 02/10/12.    . Colitis   . CVA (cerebrovascular accident) [facility])    New hemorrhagic per CT scan '09  . Diverticulosis of colon   . Dysfunctional uterine bleeding   . ESRD (end stage renal disease) on dialysis ([place])    "MWF; [address]" (11/27/2017)  . Fecal impaction ([place])   . Headache(784.0)   . Heart murmur   . HERNIORRHAPHY, HX OF 08/11/2006  . Hypertension   . OA (osteoarthritis)    bilateral knees  . Postmenopausal   . Pulmonary nodule   . TINEA CRURIS 01/12/2007   Past Surgical History  Past Surgical History:  Procedure Laterality Date  . ABDOMINAL HYSTERECTOMY    . AV FISTULA PLACEMENT Left 02/19/2017   Procedure: CREATION OF LEFT ARM BRACHIOCEPHALIC ARTERIOVENOUS (AV) FISTULA;  Surgeon: Rosetta Posner, MD;  Location: [place];  Service: Vascular;  Laterality: Left;  . BASCILIC VEIN TRANSPOSITION Left 04/23/2017   Procedure: BASILIC VEIN TRANSPOSITION SECOND STAGE;  Surgeon: Rosetta Posner, MD;  Location: [place];  Service:  Vascular;  Laterality: Left;  . BIOPSY  02/20/2018   Procedure: BIOPSY;  Surgeon: Carol Ada, MD;  Location: [facility];  Service: Endoscopy;;  . BIOPSY  03/07/2018   Procedure: BIOPSY;  Surgeon: Carol Ada, MD;  Location[facility]COPY;  Service: Endoscopy;;  . CHOLECYSTECTOMY  2009  . COLONOSCOPY    . ESOPHAGOGASTRODUODENOSCOPY N/A 03/07/2018   Procedure: ESOPHAGOGASTRODUODENOSCOPY (EGD);  Surgeon: Carol Ada, MD;  Location: [place];  Service: Endoscopy;  Laterality: N/A;  . ESOPHAGOGASTRODUODENOSCOPY (EGD) WITH PROPOFOL N/A 02/20/2018   Procedure: ESOPHAGOGASTRODUODENOSCOPY (EGD) WITH PROPOFOL;  Surgeon: Carol Ada, MD;  Location: [facility] ENDOSCOPY;  Service: Endoscopy;  Laterality: N/A;  . INGUINAL HERNIA REPAIR  2008  . INSERTION OF DIALYSIS CATHETER Right 02/19/2017   Procedure: INSERTION OF TUNNELED DIALYSIS CATHETER - RIGHT INTERNAL JUGULAR PLACEMENT;  Surgeon: Rosetta Posner, MD;  Location: [place];  Service: Vascular;  Laterality: Right;  . IRIDOTOMY / IRIDECTOMY     Laser, right eye 12/26/11 left eye 01/24/12  . MASS EXCISION Left 05/07/2013   Procedure: EXCISION CYST;  Surgeon: Myrtha Mantis., MD;  Location: [place];  Service: Ophthalmology;  Laterality: Left;  . SAVORY DILATION N/A 02/20/2018   Procedure: SAVORY DILATION;  Surgeon: Carol Ada, MD;  Location: [facility] ENDOSCOPY;  Service: Endoscopy;  Laterality: N/A;   Family History  Family History  Problem Relation Age of Onset  . Hypertension Mother   . Congestive Heart Failure Mother   . Heart attack Brother 48   Social History  reports that she has never smoked. She has never used smokeless tobacco. She reports that she does not drink alcohol or use drugs. Allergies  Allergies  Allergen Reactions  . Hydrocodone Nausea And Vomiting and Other (See Comments)    Dizziness, also  . Tape Other (See Comments)    TAPE LEAVES DARK PATCHES ON THE SKIN!!   Home medications Prior to Admission  medications   Medication Sig Start Date End Date Taking? Authorizing Provider  calcitRIOL (ROCALTROL) 0.5 MCG capsule Take 4 capsules (2 mcg total) by mouth every Monday, Wednesday, and Friday with hemodialysis. 12/30/17  Yes Neva Seat, MD  aspirin EC 81 MG EC tablet Take 1 tablet (81 mg total) by mouth daily. Patient not taking: Reported on 04/07/2018 12/10/16   Kalman Shan Ratliff, DO  diclofenac sodium (VOLTAREN) 1 % GEL Apply 4 g topically 4 (four) times daily. Patient taking differently: Apply 4 g topically every other day.  01/20/18   Mosetta Anis, MD  hydrocortisone cream 0.5 % Apply 1 application topically 2 (two) times daily. 03/05/18   Chundi, Verne Spurr, MD  latanoprost (XALATAN) 0.005 % ophthalmic solution Place 1 drop into both eyes at bedtime. 04/11/18   [provider]  mirtazapine (REMERON) 15 MG tablet Take 1 tablet (15 mg total) by mouth at bedtime. 02/01/18   [name], MD  nitroGLYCERIN (NITROSTAT) 0.3 MG SL tablet Place 1 tablet (0.3 mg total) under the tongue every 5 (five) minutes as needed for chest pain. 02/27/17   Sid Falcon, MD  pantoprazole (PROTONIX) 40 MG tablet Take 1 tablet (40 mg total) by mouth daily. Patient taking differently: Take 40 mg by mouth daily as needed (acid reflux).  01/14/18 04/17/18  [name], MD  promethazine (PHENERGAN) 12.5 MG tablet Take 1 tablet (12.5 mg total) by mouth every 6 (six) hours as needed for nausea or vomiting (Take 1 prior to dialysis). Take 1 tablet half an hour before meal. 02/01/18   [name], MD  sucroferric oxyhydroxide (VELPHORO) 500 MG chewable tablet Chew 1,500 mg by mouth 3 (three) times daily with meals.     Rexene Agent, MD   Liver Function Tests Recent Labs  Lab 04/17/18 1826 04/18/18 0251  AST 14*  --   ALT 8  --   ALKPHOS 48  --   BILITOT 1.0  --   PROT 6.2*  --   ALBUMIN 3.1* 2.6*   No results for input(s): LIPASE, AMYLASE in the last 168 hours. CBC Recent Labs  Lab  04/17/18 1826 04/18/18 0251  WBC 7.1 8.1  NEUTROABS 6.1  --   HGB 11.4* 10.1*  HCT 39.3 34.8*  MCV 99.2 99.7  PLT 131* 673*   Basic Metabolic Panel Recent Labs  Lab 04/17/18 1826 04/18/18 0251  NA 139 139  K 3.4* 3.4*  CL 98 99  CO2 31 30  GLUCOSE 95 100*  BUN 9 11  CREATININE 4.77* 5.14*  CALCIUM 8.9 8.8*  PHOS  --  4.3   Iron/TIBC/Ferritin/ %Sat    Component Value Date/Time   IRON 33 10/15/2016 0847   TIBC 202 (L) 10/15/2016 0847   FERRITIN 127 10/15/2016 0847   IRONPCTSAT 16 10/15/2016 0847   IRONPCTSAT 12 (L) 02/09/2014 4193  Vitals:   04/17/18 2254 04/18/18 0113 04/18/18 0539 04/18/18 0805  BP: (!) 128/57 137/73 (!) 152/72 137/69  Pulse: (!) 102 (!) 102 93 93  Resp: 20 20 20 18   Temp: 99 F (37.2 C) 98.1 F (36.7 C) 98.3 F (36.8 C) (!) 97.3 F (36.3 C)  TempSrc: Oral Oral Oral Oral  SpO2: 98% 100% 93% 97%  Weight: 60.3 kg     Height: 5\' 3"  (1.6 m)      Exam Gen not in distress, says she it SOB, coughing raspy cough No rash, cyanosis or gangrene Sclera anicteric, throat clear  No jvd or bruits Chest crackles L base, R clear , on wheezing RRR 2/6 harsh M, no RG Abd soft ntnd no mass or ascites +bs GU defer MS no joint effusions or deformity Ext 1-2+ pretib edema, no wounds or ulcers Neuro is alert, Ox 3 , nf    Home meds:  - aspirin 81/ pantoprazole 40 qd  - sucroferric oxyhydroxide 1500 tid ac  - sl ntg prn/ mirtazapine 15 hs/ creams/ prns/ vitamins   Dialysis: MWF  EAst  4h  300/600  59kg   3K/2.5Ca bath  Hep 1800  LUA AVF/ L IJ TDC - mircera 200 last 9/30  - venofer 50/wk  - vit d 1.75 ug tiw     Impression/ Plan: 1. SOB - CXR possible early edema, has missed HD prob vol overloaded 2. ESRD - missed Monday hd and signed off early on Wed and Friday.  Will do extra HD to see if vol removal helps her SOB.   3. Anemia ckd - cont meds 4. MBD ckd - cont meds     Kelly Splinter MD [facility] pager [phone]    04/18/2018, 9:08 AM

## 2018-04-18 NOTE — Progress Notes (Signed)
Internal Medicine Attending:   I saw and examined the patient. I reviewed the Dr Darcey Nora note and I agree with the resident's findings and plan as documented in the resident's note. Continue IV Abx today, follow blood cultures, to go for HD, will consider repeating CXR in AM.

## 2018-04-18 NOTE — Progress Notes (Signed)
Patient had 8 beat run of [place].  Asymptomatic.  VS stable.  MD made aware.  No new orders.  Will continue to monitor patient.  Earleen Reaper RN-BC, [employer]

## 2018-04-18 NOTE — Procedures (Signed)
   I was present at this dialysis session, have reviewed the session itself and made  appropriate changes Kelly Splinter MD [place] pager [phone]   04/18/2018, 1:28 PM

## 2018-04-18 NOTE — Progress Notes (Signed)
Pt found with exposed HD Cath dressing and trying to pull it out. Pt educated on HD cath infection prevention and hd tx plans. Pt signed off treatment with 22mins remainig. Treatment well tolerated, HD cath dressing changed. Pt is stable post tx.

## 2018-04-19 ENCOUNTER — Inpatient Hospital Stay (HOSPITAL_COMMUNITY): Payer: Medicare Other

## 2018-04-19 DIAGNOSIS — L821 Other seborrheic keratosis: Secondary | ICD-10-CM

## 2018-04-19 DIAGNOSIS — R918 Other nonspecific abnormal finding of lung field: Secondary | ICD-10-CM

## 2018-04-19 MED ORDER — PIPERACILLIN-TAZOBACTAM 3.375 G IVPB
3.3750 g | Freq: Two times a day (BID) | INTRAVENOUS | Status: DC
  Administered 2018-04-19 (×2): 3.375 g via INTRAVENOUS
  Filled 2018-04-19 (×3): qty 50

## 2018-04-19 MED ORDER — HYDROXYZINE HCL 10 MG PO TABS
10.0000 mg | ORAL_TABLET | Freq: Once | ORAL | Status: AC
  Administered 2018-04-19: 10 mg via ORAL
  Filled 2018-04-19: qty 1

## 2018-04-19 MED ORDER — PIPERACILLIN-TAZOBACTAM IN DEX 2-0.25 GM/50ML IV SOLN
2.2500 g | Freq: Two times a day (BID) | INTRAVENOUS | Status: DC
  Filled 2018-04-19: qty 50

## 2018-04-19 MED ORDER — SODIUM CHLORIDE 0.9 % IV SOLN
62.5000 mg | INTRAVENOUS | Status: DC
  Administered 2018-04-20: 62.5 mg via INTRAVENOUS
  Filled 2018-04-19: qty 5

## 2018-04-19 MED ORDER — DARBEPOETIN ALFA 60 MCG/0.3ML IJ SOSY
60.0000 ug | PREFILLED_SYRINGE | INTRAMUSCULAR | Status: DC
  Administered 2018-04-20: 60 ug via INTRAVENOUS

## 2018-04-19 MED ORDER — AZITHROMYCIN 500 MG PO TABS
500.0000 mg | ORAL_TABLET | Freq: Every day | ORAL | Status: DC
  Administered 2018-04-19 – 2018-04-20 (×2): 500 mg via ORAL
  Filled 2018-04-19 (×2): qty 1

## 2018-04-19 MED ORDER — ALBUTEROL SULFATE (2.5 MG/3ML) 0.083% IN NEBU
3.0000 mL | INHALATION_SOLUTION | RESPIRATORY_TRACT | Status: DC | PRN
  Administered 2018-04-19 – 2018-04-20 (×3): 3 mL via RESPIRATORY_TRACT
  Filled 2018-04-19 (×3): qty 3

## 2018-04-19 MED ORDER — PIPERACILLIN-TAZOBACTAM 3.375 G IVPB
3.3750 g | Freq: Two times a day (BID) | INTRAVENOUS | Status: DC
  Filled 2018-04-19: qty 50

## 2018-04-19 NOTE — Progress Notes (Addendum)
Subjective:  No sob or cp, tolerated HD uf yest. 2.3 l   Objective Vital signs in last 24 hours: Vitals:   04/19/18 0457 04/19/18 0711 04/19/18 0753 04/19/18 0812  BP: (!) 161/79 (!) 153/68  (!) 169/78  Pulse: (!) 102 93  98  Resp: 18 18  18   Temp: 98.7 F (37.1 C)   (!) 97.5 F (36.4 C)  TempSrc:    Oral  SpO2: 96% 100% 100% 98%  Weight:      Height:       Weight change: -1.8 kg  Physical Exam: General: alert elderly Female NAD Ox3 Heart: RRR , 2/6 sem , no r, g  Lungs: CTA bilat ,  Unlabored breathing  Abdomen: bs pos. ,soft , NT, ND  Extremities: no pedal edema Dialysis Access:  L IJ perm cath  LUA AVGG pos bruit     Home meds:  - aspirin 81/ pantoprazole 40 qd  - sucroferric oxyhydroxide 1500 tid ac  - sl ntg prn/ mirtazapine 15 hs/ creams/ prns/ vitamins   OP Dialysis: MWF  EA[facility]  4h  300/600  59kg   3K/2.5Ca bath  Hep 1800  LUA AVF/ L IJ TDC - mircera 200 last 9/30  - venofer 50/wk  - Calcitriol 1.75 ug po  tiw   Problem/Plan: 1. SOB - Admit CXR possible early edema, has missed HD and prob vol overloaded//missed Monday Hd, noted reviewing OP records = chronically signs off early at 74 OP HD / Under edw yest post HD , [place]. attempt 2-2.5 liter  UF HD ,STRESSED to Pt need for Compliance with hd time  2. ESRD - On schedule MWF now , hd tomorrow (she refuses use of AVGG as she does at her op unit) 3. Anemia ckd -  Hgb11.4 > 10.1 cont meds  Aranesp 60  in am hd / weekly wed iron on hd  4. MBD ckd - Ca and phos stable / cont meds 5. Possible PNA/ Aspiration PNA- Temp 101.9 on admit =Admit team has on Vanco/ Zosyn  /CXR  This am Continued mild CHF/volume overload./ Atelectasis and small pleural effusion at the RIGHT lung base.  Ernest Haber, PA-C [facility] [place] [phone] 04/19/2018,11:57 AM  LOS: 2 days   Pt seen, examined and agree w A/P as above.  Kelly Splinter MD [facility] pager [phone]   04/19/2018, 1:03  PM    Labs: Basic Metabolic Panel: Recent Labs  Lab 04/17/18 1826 04/18/18 0251  NA 139 139  K 3.4* 3.4*  CL 98 99  CO2 31 30  GLUCOSE 95 100*  BUN 9 11  CREATININE 4.77* 5.14*  CALCIUM 8.9 8.8*  PHOS  --  4.3   Liver Function Tests: Recent Labs  Lab 04/17/18 1826 04/18/18 0251  AST 14*  --   ALT 8  --   ALKPHOS 48  --   BILITOT 1.0  --   PROT 6.2*  --   ALBUMIN 3.1* 2.6*   No results for input(s): LIPASE, AMYLASE in the last 168 hours. No results for input(s): AMMONIA in the last 168 hours. CBC: Recent Labs  Lab 04/17/18 1826 04/18/18 0251  WBC 7.1 8.1  NEUTROABS 6.1  --   HGB 11.4* 10.1*  HCT 39.3 34.8*  MCV 99.2 99.7  PLT 131* 120*   Cardiac Enzymes: No results for input(s): CKTOTAL, CKMB, CKMBINDEX, TROPONINI in the last 168 hours. CBG: No results for input(s): GLUCAP in the last 168 hours.  Studies/Results: Dg  Chest 2 View  Result Date: 04/19/2018 CLINICAL DATA:  Cough EXAM: CHEST - 2 VIEW COMPARISON:  Chest x-rays dated 04/17/2018, 03/28/2018 and 03/13/2018. FINDINGS: Waxing and waning opacities at the RIGHT lung base, suspected atelectasis. Small RIGHT pleural effusion. Mild central pulmonary vascular congestion. Heart size and mediastinal contours are stable. Central catheter is stable in position with tip at the level of the lower SVC/cavoatrial junction. No acute or suspicious osseous finding. IMPRESSION: 1. Continued mild CHF/volume overload. 2. Atelectasis and small pleural effusion at the RIGHT lung base. Electronically Signed   By: Franki Cabot M.D.   On: 04/19/2018 09:57   Dg Chest 2 View  Result Date: 04/17/2018 CLINICAL DATA:  Cough. EXAM: CHEST - 2 VIEW COMPARISON:  Chest x-rays dated 03/28/2018 and 03/13/2018. FINDINGS: There is central pulmonary vascular congestion, similar to the previous exam. There is stable blunting of the RIGHT costophrenic angle, atelectasis and/or small pleural effusion. Questionable additional atelectasis and/or  small pleural effusion at the LEFT costophrenic angle. Lungs otherwise clear. Mild cardiomegaly is stable. Atherosclerotic changes noted at the aortic arch. Dialysis catheter is stable in position. IMPRESSION: 1. Mild CHF/volume overload, similar to previous exams. Probable associated small pleural effusions bilaterally. 2. No evidence of pneumonia or alveolar pulmonary edema. 3.  Aortic Atherosclerosis (ICD10-I70.0). Electronically Signed   By: Franki Cabot M.D.   On: 04/17/2018 18:28   Medications: . piperacillin-tazobactam (ZOSYN)  IV 3.375 g (04/19/18 0946)   . acetaminophen  650 mg Oral Once  . aspirin EC  81 mg Oral Daily  . azithromycin  500 mg Oral Daily  . [START ON 04/20/2018] calcitRIOL  2 mcg Oral Q M,W,F-HD  . Chlorhexidine Gluconate Cloth  6 each Topical Q0600  . heparin  5,000 Units Subcutaneous Q8H  . pantoprazole  40 mg Oral Daily  . sucroferric oxyhydroxide  1,500 mg Oral TID WC

## 2018-04-19 NOTE — Progress Notes (Addendum)
Pharmacy Antibiotic Note  Nancy [name] is a 77 y.o. female admitted on 04/17/2018 with SOB, PMH ESRD, GERD, CVA, HTN, HFpEF. Pharmacy has been consulted for Zosyn dosing.  Plan: Start zosyn 3.375g IV every 12 hours - 4 hour infusion Continue vancomycin 500mg  with HD MWF.  Height: 5\' 3"  (160 cm) Weight: 122 lb 9.2 oz (55.6 kg) IBW/kg (Calculated) : 52.4  Temp (24hrs), Avg:97.9 F (36.6 C), Min:96.8 F (36 C), Max:98.7 F (37.1 C)  Recent Labs  Lab 04/17/18 1826 04/17/18 1941 04/18/18 0251  WBC 7.1  --  8.1  CREATININE 4.77*  --  5.14*  LATICACIDVEN  --  2.30* 1.3    Estimated Creatinine Clearance: 7.8 mL/min (A) (by C-G formula based on SCr of 5.14 mg/dL (H)).    Allergies  Allergen Reactions  . Hydrocodone Nausea And Vomiting and Other (See Comments)    Dizziness, also  . Tape Other (See Comments)    TAPE LEAVES DARK PATCHES ON THE SKIN!!    Antimicrobials this admission: 10/11 vancomycin >>  10/11 cefepime >> 10/13 10/13 zosyn >>   Dose adjustments this admission: n/a  Microbiology results: 10/11 BCx: NGTD 10/11 Resp PCR: Negative  10/12 MRSA PCR: Negative    Thank you for allowing pharmacy to be a part of this patient's care.  Tamela Gammon, PharmD 04/19/2018 8:02 AM PGY-1 Pharmacy Resident Phone: [phone] Please check [email or web address] for unit-specific pharmacist phone numbers after 3:00 P.M.

## 2018-04-19 NOTE — Progress Notes (Addendum)
   Subjective: Patient is feeling SOB this AM. She does not feel any better after HD yesterday. Perseverating on the seborrheic keratosis on her back and would like it removed. She already went down for her repeat CXR this AM. We discussed staying in the hospital at least until tomorrow to ensure negative [facility] and get another session of HD. All questions and concerns addressed.   Objective: Vital signs in last 24 hours: Vitals:   04/18/18 1515 04/18/18 1648 04/18/18 2028 04/19/18 0457  BP: (!) 148/72 (!) 146/66 (!) 163/104 (!) 161/79  Pulse: 92 99 97 (!) 102  Resp: 18 18 18 18   Temp: 98.5 F (36.9 C) 98.1 F (36.7 C) 98.1 F (36.7 C) 98.7 F (37.1 C)  TempSrc: Oral Oral    SpO2:  98% 98% 96%  Weight: 55.6 kg  55.6 kg   Height:       General: Elderly female in no acute distress Pulm: Good air movement with no wheezing or crackles  CV: RRR, systolic murmur  Abdomen: Soft, non-distended, no tenderness to palpation  Extremities: No LE edema   Assessment/Plan:  Ms. Peterkin is a 77 y.o female with ESRD, GERD, CVA, HTN, and HFpEF who presented with 7 days of dyspnea and cough. In the ED she was found to be febrile to 101.93F and started on IV Vancomycin and Cefepime. She has since been afebrile since admission and repeat CXR this AM illustrates a possible RLL infiltrate. Further management as outline below.   Possible PNA vs Aspiration PNA - Continue to have cough and SOB - Isolated Tmax of 101.9 F on admission, since has been afebrile  - Viral panel negative  - BC with no growth to date  - Repeat CXR appears to have a RLL infiltrate, but will await radiologist read  - Continuing IV Vanc and Cefepime, will switch the Cefepime to Zosyn to cover anaerobes in the setting of possible aspiration PNA  ESRD on HD - HD yesterday with 2L removed  - Appreciate Nephrology consult and recs.  Dispo: Anticipated discharge tomorrow if [facility] remain negative and after HD.   Ina Homes,  MD 04/19/2018, 6:40 AM   ADDENDUM: Reviewed patient's chart. BC negative at 48 hours and MRSA PCR is negative. Although she is at risk for MDR organisms, I feel it is appropriate to pull off the vancomycin at this point. Will add a macrolide for atypical coverage.

## 2018-04-19 NOTE — Progress Notes (Signed)
Called for iv team consult.  [place] dsg  Found in bed with small amount blood.  Biopatch found on floor saturated with blood.  [place] site completely exposed upon arrival.  Sutures remain intact.  No active bleeding at site.  New dsg applied.  Pt explains itching around dsg.as cause of scratching.  RN notified.  Will apply benadryl cream.

## 2018-04-19 NOTE — Progress Notes (Signed)
Continue to c/o itching.  dsg chg'd again.  biopatch guaze and hypafix.  RN aware.  MD called.

## 2018-04-19 NOTE — Progress Notes (Signed)
Internal Medicine Attending:   I saw and examined the patient. I reviewed the Dr Jerrell Mylar note and I agree with the resident's findings and plan as documented in the resident's note with the following additions. Overall appears stable, more comfortable after HD.  Reviewed CXR agree concerning for RLL infiltrate. Nasal MRSA PCR negative, agree with D/C of Vanc, transition to zosyn, if BCx remain negative tomorrow we can likely transition to oral Augmentin+Azitromycin (probably dont need pseudomonal coverage).

## 2018-04-20 ENCOUNTER — Telehealth: Payer: Self-pay | Admitting: Internal Medicine

## 2018-04-20 DIAGNOSIS — Z91048 Other nonmedicinal substance allergy status: Secondary | ICD-10-CM

## 2018-04-20 DIAGNOSIS — R238 Other skin changes: Secondary | ICD-10-CM

## 2018-04-20 LAB — RENAL FUNCTION PANEL
ALBUMIN: 2.5 g/dL — AB (ref 3.5–5.0)
ANION GAP: 11 (ref 5–15)
BUN: 12 mg/dL (ref 8–23)
CHLORIDE: 99 mmol/L (ref 98–111)
CO2: 28 mmol/L (ref 22–32)
Calcium: 8.6 mg/dL — ABNORMAL LOW (ref 8.9–10.3)
Creatinine, Ser: 4.56 mg/dL — ABNORMAL HIGH (ref 0.44–1.00)
GFR, EST AFRICAN AMERICAN: 10 mL/min — AB (ref >60)
GFR, EST NON AFRICAN AMERICAN: 9 mL/min — AB (ref >60)
Glucose, Bld: 97 mg/dL (ref 70–99)
PHOSPHORUS: 3.9 mg/dL (ref 2.5–4.6)
POTASSIUM: 3.1 mmol/L — AB (ref 3.5–5.1)
Sodium: 138 mmol/L (ref 135–145)

## 2018-04-20 LAB — CBC
HEMATOCRIT: 33.5 % — AB (ref 36.0–46.0)
HEMOGLOBIN: 10.1 g/dL — AB (ref 12.0–15.0)
MCH: 29.1 pg (ref 26.0–34.0)
MCHC: 30.1 g/dL (ref 30.0–36.0)
MCV: 96.5 fL (ref 80.0–100.0)
NRBC: 0 % (ref 0.0–0.2)
Platelets: 146 10*3/uL — ABNORMAL LOW (ref 150–400)
RBC: 3.47 MIL/uL — AB (ref 3.87–5.11)
RDW: 15.9 % — AB (ref 11.5–15.5)
WBC: 4.2 10*3/uL (ref 4.0–10.5)

## 2018-04-20 MED ORDER — AMOXICILLIN-POT CLAVULANATE 875-125 MG PO TABS: 1.0000 | ORAL_TABLET | Freq: Two times a day (BID) | ORAL | Status: DC

## 2018-04-20 MED ORDER — HEPARIN SODIUM (PORCINE) 1000 UNIT/ML IJ SOLN
INTRAMUSCULAR | Status: AC
  Administered 2018-04-20: 3800 [IU]
  Filled 2018-04-20: qty 1

## 2018-04-20 MED ORDER — DARBEPOETIN ALFA 60 MCG/0.3ML IJ SOSY
PREFILLED_SYRINGE | INTRAMUSCULAR | Status: AC
  Filled 2018-04-20: qty 0.3

## 2018-04-20 MED ORDER — AMOXICILLIN-POT CLAVULANATE 500-125 MG PO TABS: 1.0000 | ORAL_TABLET | Freq: Every day | ORAL | 0 refills | Status: DC

## 2018-04-20 MED ORDER — CALCITRIOL 0.5 MCG PO CAPS
ORAL_CAPSULE | ORAL | Status: AC
  Filled 2018-04-20: qty 4

## 2018-04-20 MED ORDER — AMOXICILLIN-POT CLAVULANATE 500-125 MG PO TABS
1.0000 | ORAL_TABLET | Freq: Every day | ORAL | Status: DC
  Administered 2018-04-20: 500 mg via ORAL
  Filled 2018-04-20 (×2): qty 1

## 2018-04-20 MED ORDER — GUAIFENESIN 100 MG/5ML PO SOLN: 5.0000 mL | ORAL | 0 refills | Status: DC | PRN

## 2018-04-20 MED ORDER — AZITHROMYCIN 500 MG PO TABS: 500.0000 mg | ORAL_TABLET | Freq: Every day | ORAL | 0 refills | Status: DC

## 2018-04-20 MED ORDER — DIPHENHYDRAMINE-ZINC ACETATE 2-0.1 % EX CREA: TOPICAL_CREAM | Freq: Three times a day (TID) | CUTANEOUS | 0 refills | Status: AC | PRN

## 2018-04-20 NOTE — Telephone Encounter (Signed)
H/F per Dr Tarri Abernethy; pt appt 10/22 1045am/nw

## 2018-04-20 NOTE — Discharge Summary (Signed)
Name: Nancy [name] MRN: [id number] DOB: 1941/03/25 77 y.o. PCP: Nancy Contes, MD  Date of Admission: 04/17/2018  5:00 PM Date of Discharge:  Attending Physician: Nancy Groves, DO  Discharge Diagnosis: 1. CAP  Discharge Medications: Allergies as of 04/20/2018      Reactions   Hydrocodone Nausea And Vomiting, Other (See Comments)   Dizziness, also   Tape Other (See Comments)   TAPE LEAVES DARK PATCHES ON THE SKIN!!      Medication List    TAKE these medications   amoxicillin-clavulanate 500-125 MG tablet Commonly known as:  AUGMENTIN Take 1 tablet (500 mg total) by mouth daily. Start taking on:  04/21/2018   aspirin 81 MG EC tablet Take 1 tablet (81 mg total) by mouth daily.   azithromycin 500 MG tablet Commonly known as:  ZITHROMAX Take 1 tablet (500 mg total) by mouth daily.   calcitRIOL 0.5 MCG capsule Commonly known as:  ROCALTROL Take 4 capsules (2 mcg total) by mouth every Monday, Wednesday, and Friday with hemodialysis.   diclofenac sodium 1 % Gel Commonly known as:  VOLTAREN Apply 4 g topically 4 (four) times daily.   diphenhydrAMINE-zinc acetate cream Commonly known as:  BENADRYL Apply topically 3 (three) times daily as needed for itching.   guaiFENesin 100 MG/5ML Soln Commonly known as:  ROBITUSSIN Take 5 mLs (100 mg total) by mouth every 4 (four) hours as needed for cough or to loosen phlegm.   hydrocortisone cream 0.5 % Apply 1 application topically 2 (two) times daily.   latanoprost 0.005 % ophthalmic solution Commonly known as:  XALATAN Place 1 drop into both eyes at bedtime.   mirtazapine 15 MG tablet Commonly known as:  REMERON Take 1 tablet (15 mg total) by mouth at bedtime.   nitroGLYCERIN 0.3 MG SL tablet Commonly known as:  NITROSTAT Place 1 tablet (0.3 mg total) under the tongue every 5 (five) minutes as needed for chest pain.   pantoprazole 40 MG tablet Commonly known as:  PROTONIX Take 1 tablet (40 mg total) by  mouth daily. What changed:    when to take this  reasons to take this   promethazine 12.5 MG tablet Commonly known as:  PHENERGAN Take 1 tablet (12.5 mg total) by mouth every 6 (six) hours as needed for nausea or vomiting (Take 1 prior to dialysis). Take 1 tablet half an hour before meal.       Disposition and follow-up:   Nancy [name] was discharged from [facility] in Stable condition.  At the hospital follow up visit please address:  1.  Patient is discharged with Azithromycin and Augmentin. Please evaluate for improvement of SOB. 2. Patient is complaining of a "bump" on her back that has been chronic and she wants it to be removed. We appreciate outpatient work up.  2.  Labs / imaging needed at time of follow-up: None  3.  Pending labs/ test needing follow-up: None  Follow-up Appointments: Follow-up Information    Nancy Contes, MD Follow up on 04/28/2018.   Specialty:  Internal Medicine Why:  @10 :45 Contact information: [place], [address] [place] [id number] [facility] Course by problem list: 1. [name] is a 77 y.o female with ESRD, GERD, CVA, HTN, and HFpEF who presented with 7 days of dyspnea and cough. On ED, was febrile to 101.9 F. With elevated lactic acid. No leucocytosis. Viral respiratory panel negative.  CXR showed: bibasilar pleural effusion and increasd interstitial marking. Started with IV Vancomycin and Cefipime. Lactic acid trended down. Next day after getting HD, repeat CXR showed possible RLL infiltrate. Nasal MRSA negative. Treatment switched to Azithromycin and Zocin to cover possible aspiration PNA. Blood culture remained negative. Patient improved over admission and discharged with Azithromycin and Augmentin after second HD.   2.Patient has ESRD with HD scheduled on MWF, (Prior to admission, she did not complet her last HD due to SOB).  Completed 2 non urgent HD sessions on 10/12 and  10/14, during this admission.   Discharge Vitals:   BP (!) 147/66 (BP Location: Right Arm)   Pulse 97   Temp 97.6 F (36.4 C) (Oral)   Resp 18   Ht 5\' 3"  (1.6 m)   Wt 57.2 kg   SpO2 99%   BMI 22.34 kg/m   Pertinent Labs, Studies, and Procedures:  Lactic acis: 2.30(04/17/18)---> 1.3 (04/18/18)  CBC: WBC: 7.1 and 8.1  MRSA by PCR: negative Viral Respiratory panel and Influenza A and B: Negative CXR: 04/17/2018: Small pleural effusion bilaterally and increased interstitial marking CXR: 06/19/2018: Right lower lobe infiltration  Discharge Instructions:   Signed: [name], MD 04/20/2018, 1:00 PM   Pager: [phone]

## 2018-04-20 NOTE — Progress Notes (Signed)
Internal Medicine Attending:   I saw and examined the patient. I reviewed the resident's note and I agree with the resident's findings and plan as documented in the resident's note.  See at HD, now off supplemental O2, feels much better, lungs clear from anterior bilaterally, overall feeling much better.  As noted will discharge home today with short course of Augmentin and azithromycin for PNA.

## 2018-04-20 NOTE — Progress Notes (Signed)
   04/19/18 2105  Provider Notification  Date Provider Notified 04/19/18  Time Provider Notified 2105  Notification Type Page  Notification Reason Other (Comment) (pulling drsg from HD cath off - c/o itching)  Response See new orders  Date of Provider Response 04/19/18  Time of Provider Response 2106   Patient has pulled her hemodialysis dressing off 6 times the three nights I have taken care of her.  Attempts to educate patient are met with non compliance.  She states the area needs to "the air" and she hasn't gotten an infection yet.  IV team has been called each time to redress the site.  She is complaining of itching also.  MD order Benadryl cream and Atarax.  Each was given to the patient.  Will continue to monitor the patient.  Earleen Reaper RN-BC, [employer]

## 2018-04-20 NOTE — Progress Notes (Signed)
Patient towards the end of treatment became agitated, whe wasnted to end the treatment prematurely but convinced her to stay till the end, then at the end of treatment she insisted that her cath dressing be changes but when this nurse explained to her that  The dressing had been changed the night before and per policy if the dressing was clean dry and intact we would not change it, she kept insisting so this nurse got supplies to come change the dressing but then the patient  Refused to have the dressing changed or to be weighed, the charge nurse Dain RN was informed, she tried to talk to the patient but the patient was refused report was called to the floor nurse Durward Mallard and patient was returned to her room

## 2018-04-20 NOTE — Progress Notes (Signed)
   Subjective: Patient was seen and evaluated at bedside while getting HD today. Complained of some shortness of breath today morning when came to HD. She mentions that it got better when got nasal O2. She also says that the bump that she has had on her back, still bothers her and she is interested in removing that. We talked about planning to take care of it at outpatient and she agrees.  Objective:  Vital signs in last 24 hours: Vitals:   04/20/18 1000 04/20/18 1030 04/20/18 1100 04/20/18 1216  BP: (!) 144/72 (!) 148/42 (!) 147/65 (!) 147/66  Pulse: 87 92 91 97  Resp:    18  Temp:    97.6 F (36.4 C)  TempSrc:    Oral  SpO2:    99%  Weight:      Height:       Physical exam: Vital sign reviewd. General: Pleasant lady laying back on the bed, in no acute distress in room air.  CV: RRR, Nl S1S2. Heart sound is distant. Murmur? Chest and lungs: She is getting her hemodialysis through the cath at left chest. limited due to not able to move the patient when in HD at both visits today. Lateral chest exam was clear to auscultation. Abdomen: Soft and non-tender. Extremities: Mild 1+ bilateral pitting edema. Pulses are normal Neurologic exam: Alert and oriented x3 Skin: soft Papule on her back  Assessment/Plan:  Principal Problem:   Bacterial lobar pneumonia Active Problems:   End stage renal disease ([place])  Ms. Hineman is a 77 y.o female with ESRD, GERD, CVA, HTN, and HFpEF who presented with dyspnea and cough. In the ED she was found to be febrile to 101.49F.   Possible PNA vs Aspiration PNA Started on IV Vancomycin and Cefepime on arrival. (04/17/2018) Repeat CXR yesterday showed possible RLL infiltrate concerning for aspiration pneumonia. Vanc Discontinued (Neg nasal MRSA) and Zocin started yesterday, BC has been negative so far. Symptoms improved, She has been afebrile since admission. Will discharge the patient today to continue treatment with oral antibiotics.   -Discharge after  HD with Azithromycin + Augmentin (With adjusting dose of Augmentin for HD patient) -F/u in clinic  ESRD on HD MWF: Stable Is getting HD now -DC after HD ad follow up out patient HD schedule as before. -C/w Calcitriol on HD days  Hx of HFpEF and CVA: Last Echo 01/29/2018: EF: 29-56%, grade 1 diastolic dysfunction. No volume overload on exam -Continue ASA 80 mg QD at home  GERD: stable.  -C/w Pantoprazole 40 mg QD at home  Diet: Renal IV fluid: None. Fluid restriction Code: DNR, DNI VTE ppx: Hep  Dispo: Anticipated discharge today after HD.  [name], MD 04/20/2018, 1:43 PM Pager: [phone]

## 2018-04-20 NOTE — Discharge Instructions (Signed)
It was our pleasure taking care of at [facility] today.  We are glad that you feel better. Please make sure you take your antibiotics as prescribed for you, follow with your hemodialysis sessions and follow up in our clinic as scheduled for you.  Please take your medications as instructed and contact us at [phone] if you have any question or concern. As always, if having severe symptoms, please seek medical attention at emergency room.  Thanks, Dr. Linna Hoff

## 2018-04-20 NOTE — Progress Notes (Signed)
[place] to be D/C'd Home per MD order.  Discussed prescriptions and follow up appointments with the patient. Prescriptions given to patient, medication list explained in detail. Pt verbalized understanding.  Allergies as of 04/20/2018      Reactions   Hydrocodone Nausea And Vomiting, Other (See Comments)   Dizziness, also   Tape Other (See Comments)   TAPE LEAVES DARK PATCHES ON THE SKIN!!      Medication List    TAKE these medications   amoxicillin-clavulanate 500-125 MG tablet Commonly known as:  AUGMENTIN Take 1 tablet (500 mg total) by mouth daily. Start taking on:  04/21/2018   aspirin 81 MG EC tablet Take 1 tablet (81 mg total) by mouth daily.   azithromycin 500 MG tablet Commonly known as:  ZITHROMAX Take 1 tablet (500 mg total) by mouth daily.   calcitRIOL 0.5 MCG capsule Commonly known as:  ROCALTROL Take 4 capsules (2 mcg total) by mouth every Monday, Wednesday, and Friday with hemodialysis.   diclofenac sodium 1 % Gel Commonly known as:  VOLTAREN Apply 4 g topically 4 (four) times daily.   diphenhydrAMINE-zinc acetate cream Commonly known as:  BENADRYL Apply topically 3 (three) times daily as needed for itching.   guaiFENesin 100 MG/5ML Soln Commonly known as:  ROBITUSSIN Take 5 mLs (100 mg total) by mouth every 4 (four) hours as needed for cough or to loosen phlegm.   hydrocortisone cream 0.5 % Apply 1 application topically 2 (two) times daily.   latanoprost 0.005 % ophthalmic solution Commonly known as:  XALATAN Place 1 drop into both eyes at bedtime.   mirtazapine 15 MG tablet Commonly known as:  REMERON Take 1 tablet (15 mg total) by mouth at bedtime.   nitroGLYCERIN 0.3 MG SL tablet Commonly known as:  NITROSTAT Place 1 tablet (0.3 mg total) under the tongue every 5 (five) minutes as needed for chest pain.   pantoprazole 40 MG tablet Commonly known as:  PROTONIX Take 1 tablet (40 mg total) by mouth daily. What changed:    when to  take this  reasons to take this   promethazine 12.5 MG tablet Commonly known as:  PHENERGAN Take 1 tablet (12.5 mg total) by mouth every 6 (six) hours as needed for nausea or vomiting (Take 1 prior to dialysis). Take 1 tablet half an hour before meal.       Vitals:   04/20/18 1100 04/20/18 1216  BP: (!) 147/65 (!) 147/66  Pulse: 91 97  Resp:  18  Temp:  97.6 F (36.4 C)  SpO2:  99%    Skin clean, dry and intact without evidence of skin break down, no evidence of skin tears noted. IV catheter discontinued intact. Site without signs and symptoms of complications. Dressing and pressure applied.  An After Visit Summary was printed and given to the patient. Patient escorted via [place], and D/C home via private auto.

## 2018-04-21 ENCOUNTER — Encounter (HOSPITAL_COMMUNITY): Payer: Self-pay | Admitting: Emergency Medicine

## 2018-04-21 ENCOUNTER — Emergency Department (HOSPITAL_COMMUNITY): Payer: Medicare Other

## 2018-04-21 ENCOUNTER — Emergency Department (HOSPITAL_COMMUNITY)
Admission: EM | Admit: 2018-04-21 | Discharge: 2018-04-21 | Disposition: A | Discharge status: Home / Self Care | Payer: Medicare Other | Attending: Emergency Medicine | Admitting: Emergency Medicine

## 2018-04-21 ENCOUNTER — Other Ambulatory Visit: Payer: Self-pay

## 2018-04-21 DIAGNOSIS — I132 Hypertensive heart and chronic kidney disease with heart failure and with stage 5 chronic kidney disease, or end stage renal disease: Secondary | ICD-10-CM | POA: Diagnosis not present

## 2018-04-21 DIAGNOSIS — N186 End stage renal disease: Secondary | ICD-10-CM | POA: Insufficient documentation

## 2018-04-21 DIAGNOSIS — R0902 Hypoxemia: Secondary | ICD-10-CM | POA: Diagnosis not present

## 2018-04-21 DIAGNOSIS — Z79899 Other long term (current) drug therapy: Secondary | ICD-10-CM | POA: Insufficient documentation

## 2018-04-21 DIAGNOSIS — R0602 Shortness of breath: Secondary | ICD-10-CM | POA: Diagnosis not present

## 2018-04-21 DIAGNOSIS — I509 Heart failure, unspecified: Secondary | ICD-10-CM | POA: Insufficient documentation

## 2018-04-21 DIAGNOSIS — Z7982 Long term (current) use of aspirin: Secondary | ICD-10-CM | POA: Insufficient documentation

## 2018-04-21 DIAGNOSIS — R42 Dizziness and giddiness: Secondary | ICD-10-CM | POA: Diagnosis not present

## 2018-04-21 DIAGNOSIS — J80 Acute respiratory distress syndrome: Secondary | ICD-10-CM | POA: Diagnosis not present

## 2018-04-21 LAB — BASIC METABOLIC PANEL
Anion gap: 16 — ABNORMAL HIGH (ref 5–15)
BUN: 12 mg/dL (ref 8–23)
CALCIUM: 10.4 mg/dL — AB (ref 8.9–10.3)
CO2: 23 mmol/L (ref 22–32)
CREATININE: 4.63 mg/dL — AB (ref 0.44–1.00)
Chloride: 102 mmol/L (ref 98–111)
GFR calc Af Amer: 10 mL/min — ABNORMAL LOW (ref >60)
GFR, EST NON AFRICAN AMERICAN: 8 mL/min — AB (ref >60)
GLUCOSE: 124 mg/dL — AB (ref 70–99)
Potassium: 3.8 mmol/L (ref 3.5–5.1)
Sodium: 141 mmol/L (ref 135–145)

## 2018-04-21 LAB — CBC WITH DIFFERENTIAL/PLATELET
Abs Immature Granulocytes: 0.08 10*3/uL — ABNORMAL HIGH (ref 0.00–0.07)
BASOS ABS: 0 10*3/uL (ref 0.0–0.1)
BASOS PCT: 0 %
EOS ABS: 0 10*3/uL (ref 0.0–0.5)
EOS PCT: 0 %
HEMATOCRIT: 41.9 % (ref 36.0–46.0)
Hemoglobin: 12.3 g/dL (ref 12.0–15.0)
IMMATURE GRANULOCYTES: 1 %
LYMPHS ABS: 0.4 10*3/uL — AB (ref 0.7–4.0)
Lymphocytes Relative: 7 %
MCH: 29.1 pg (ref 26.0–34.0)
MCHC: 29.4 g/dL — AB (ref 30.0–36.0)
MCV: 99.3 fL (ref 80.0–100.0)
Monocytes Absolute: 0.3 10*3/uL (ref 0.1–1.0)
Monocytes Relative: 5 %
NEUTROS PCT: 87 %
NRBC: 0 % (ref 0.0–0.2)
Neutro Abs: 5.7 10*3/uL (ref 1.7–7.7)
PLATELETS: 184 10*3/uL (ref 150–400)
RBC: 4.22 MIL/uL (ref 3.87–5.11)
RDW: 16 % — AB (ref 11.5–15.5)
WBC: 6.5 10*3/uL (ref 4.0–10.5)

## 2018-04-21 MED ORDER — AZITHROMYCIN 250 MG PO TABS
500.0000 mg | ORAL_TABLET | Freq: Once | ORAL | Status: AC
  Administered 2018-04-21: 500 mg via ORAL
  Filled 2018-04-21: qty 2

## 2018-04-21 MED ORDER — AMOXICILLIN-POT CLAVULANATE 500-125 MG PO TABS
1.0000 | ORAL_TABLET | Freq: Once | ORAL | Status: AC
  Administered 2018-04-21: 500 mg via ORAL
  Filled 2018-04-21: qty 1

## 2018-04-21 NOTE — ED Notes (Signed)
Patient verbalizes understanding of discharge instructions. Opportunity for questioning and answers were provided. Armband removed by staff, pt discharged from ED in wheelchair. Pt granddaughter will pick her up.

## 2018-04-21 NOTE — ED Notes (Signed)
Patient transported to X-ray 

## 2018-04-21 NOTE — ED Provider Notes (Signed)
[facility] EMERGENCY DEPARTMENT Provider Note   CSN: [id number] Arrival date & time: 04/21/18  1730     History   Chief Complaint Chief Complaint  Patient presents with  . Shortness of Breath    HPI Nancy [name] is a 77 y.o. female.  The history is provided by the patient.  Shortness of Breath  This is a chronic problem. The problem occurs continuously.The current episode started more than 1 week ago. Progression since onset: waxes and wanes. Associated symptoms include cough. Pertinent negatives include no fever, no headaches, no sore throat, no ear pain, no sputum production, no hemoptysis, no orthopnea, no chest pain, no vomiting, no abdominal pain, no rash, no leg pain and no leg swelling. Precipitated by: Discharged yesterday on augment and azithro for pneumonia. States still feels sob. Had diaylsis yesterday. Hasnt taken antibiotics today.  She has tried nothing for the symptoms. The treatment provided no relief. Associated medical issues include heart failure. Associated medical issues comments: ESRD on HD.    Past Medical History:  Diagnosis Date  . Anemia   . Aortic stenosis   . Bacterial sinusitis 09/17/2011  . CHF (congestive heart failure) ([place])   . CKD (chronic kidney disease) stage 4, GFR 15-29 ml/min ([place]) 08/11/2006   Cr continues to increase. Proteinuria on UA 02/10/12.    . Colitis   . CVA (cerebrovascular accident) [facility])    New hemorrhagic per CT scan '09  . Diverticulosis of colon   . Dysfunctional uterine bleeding   . ESRD (end stage renal disease) on dialysis ([place])    "MWF; [facility]" (11/27/2017)  . Fecal impaction ([place])   . Headache(784.0)   . Heart murmur   . HERNIORRHAPHY, HX OF 08/11/2006  . Hypertension   . OA (osteoarthritis)    bilateral knees  . Postmenopausal   . Pulmonary nodule   . TINEA CRURIS 01/12/2007    Patient Active Problem List   Diagnosis Date Noted  . Bacterial lobar pneumonia 04/17/2018  . Acute  gastritis without hemorrhage   . Duodenitis with bleeding   . ESRD (end stage renal disease) ([place])   . Pityriasis rosea 03/06/2018  . Acute on chronic blood loss anemia 03/06/2018  . Protein-calorie malnutrition, severe 01/12/2018  . Midline thoracic back pain 12/23/2017  . Symptomatic anemia 02/17/2017  . Secondary hyperparathyroidism of renal origin ([place]) 01/15/2017  . Mitral valve annular calcification   . Dizziness 12/08/2016  . Angina at rest [facility])   . Goals of care, counseling/discussion   . Aortic stenosis 05/21/2016  . Anemia associated with chronic renal failure 02/01/2016  . Atherosclerosis of aorta ([place]) 01/11/2015  . End stage renal disease ([place]) 02/04/2013  . Non-intractable vomiting with nausea 08/17/2012  . Glaucoma 03/18/2012  . Health care maintenance 09/17/2011  . Osteoarthrosis involving lower leg 10/31/2008  . History of CVA (cerebrovascular accident) 01/28/2008  . Hyperlipidemia 02/13/2007  . PULMONARY NODULES 01/12/2007  . Left ventricular hypertrophy 09/02/2006  . Essential hypertension 08/11/2006  . GERD 08/11/2006    Past Surgical History:  Procedure Laterality Date  . ABDOMINAL HYSTERECTOMY    . AV FISTULA PLACEMENT Left 02/19/2017   Procedure: CREATION OF LEFT ARM BRACHIOCEPHALIC ARTERIOVENOUS (AV) FISTULA;  Surgeon: Rosetta Posner, MD;  Location: [place];  Service: Vascular;  Laterality: Left;  . BASCILIC VEIN TRANSPOSITION Left 04/23/2017   Procedure: BASILIC VEIN TRANSPOSITION SECOND STAGE;  Surgeon: Rosetta Posner, MD;  Location: [place];  Service: Vascular;  Laterality: Left;  .  BIOPSY  02/20/2018   Procedure: BIOPSY;  Surgeon: Carol Ada, MD;  Location: [facility];  Service: Endoscopy;;  . BIOPSY  03/07/2018   Procedure: BIOPSY;  Surgeon: Carol Ada, MD;  Location[facility];  Service: Endoscopy;;  . CHOLECYSTECTOMY  2009  . COLONOSCOPY    . ESOPHAGOGASTRODUODENOSCOPY N/A 03/07/2018   Procedure: ESOPHAGOGASTRODUODENOSCOPY (EGD);  Surgeon:  Carol Ada, MD;  Location: [place];  Service: Endoscopy;  Laterality: N/A;  . ESOPHAGOGASTRODUODENOSCOPY (EGD) WITH PROPOFOL N/A 02/20/2018   Procedure: ESOPHAGOGASTRODUODENOSCOPY (EGD) WITH PROPOFOL;  Surgeon: Carol Ada, MD;  Location: [facility] ENDOSCOPY;  Service: Endoscopy;  Laterality: N/A;  . INGUINAL HERNIA REPAIR  2008  . INSERTION OF DIALYSIS CATHETER Right 02/19/2017   Procedure: INSERTION OF TUNNELED DIALYSIS CATHETER - RIGHT INTERNAL JUGULAR PLACEMENT;  Surgeon: Rosetta Posner, MD;  Location: [place];  Service: Vascular;  Laterality: Right;  . IRIDOTOMY / IRIDECTOMY     Laser, right eye 12/26/11 left eye 01/24/12  . MASS EXCISION Left 05/07/2013   Procedure: EXCISION CYST;  Surgeon: Myrtha Mantis., MD;  Location: [place];  Service: Ophthalmology;  Laterality: Left;  . SAVORY DILATION N/A 02/20/2018   Procedure: SAVORY DILATION;  Surgeon: Carol Ada, MD;  Location: [facility] ENDOSCOPY;  Service: Endoscopy;  Laterality: N/A;     OB History   None      Home Medications    Prior to Admission medications   Medication Sig Start Date End Date Taking? Authorizing Provider  amoxicillin-clavulanate (AUGMENTIN) 500-125 MG tablet Take 1 tablet (500 mg total) by mouth daily. 04/21/18   Masoudi, Dorthula Rue, MD  aspirin EC 81 MG EC tablet Take 1 tablet (81 mg total) by mouth daily. Patient not taking: Reported on 04/07/2018 12/10/16   Kalman Shan Ratliff, DO  azithromycin (ZITHROMAX) 500 MG tablet Take 1 tablet (500 mg total) by mouth daily. 04/20/18   Masoudi, Dorthula Rue, MD  calcitRIOL (ROCALTROL) 0.5 MCG capsule Take 4 capsules (2 mcg total) by mouth every Monday, Wednesday, and Friday with hemodialysis. Patient not taking: Reported on 04/18/2018 12/30/17   Neva Seat, MD  diclofenac sodium (VOLTAREN) 1 % GEL Apply 4 g topically 4 (four) times daily. Patient not taking: Reported on 04/18/2018 01/20/18   Mosetta Anis, MD  diphenhydrAMINE-zinc acetate  (BENADRYL) cream Apply topically 3 (three) times daily as needed for itching. 04/20/18   Masoudi, Dorthula Rue, MD  guaiFENesin (ROBITUSSIN) 100 MG/5ML SOLN Take 5 mLs (100 mg total) by mouth every 4 (four) hours as needed for cough or to loosen phlegm. 04/20/18   Masoudi, Elhamalsadat, MD  hydrocortisone cream 0.5 % Apply 1 application topically 2 (two) times daily. Patient not taking: Reported on 04/18/2018 03/05/18   Lars Mage, MD  latanoprost (XALATAN) 0.005 % ophthalmic solution Place 1 drop into both eyes at bedtime. 04/11/18   [provider]  mirtazapine (REMERON) 15 MG tablet Take 1 tablet (15 mg total) by mouth at bedtime. Patient not taking: Reported on 04/18/2018 02/01/18   [name], MD  nitroGLYCERIN (NITROSTAT) 0.3 MG SL tablet Place 1 tablet (0.3 mg total) under the tongue every 5 (five) minutes as needed for chest pain. Patient not taking: Reported on 04/18/2018 02/27/17   Sid Falcon, MD  pantoprazole (PROTONIX) 40 MG tablet Take 1 tablet (40 mg total) by mouth daily. Patient taking differently: Take 40 mg by mouth daily as needed (acid reflux).  01/14/18 04/17/18  [name], MD  promethazine (PHENERGAN) 12.5 MG tablet Take 1 tablet (12.5 mg  total) by mouth every 6 (six) hours as needed for nausea or vomiting (Take 1 prior to dialysis). Take 1 tablet half an hour before meal. Patient not taking: Reported on 04/18/2018 02/01/18   [name], MD    Family History Family History  Problem Relation Age of Onset  . Hypertension Mother   . Congestive Heart Failure Mother   . Heart attack Brother 19    Social History Social History   Tobacco Use  . Smoking status: Never Smoker  . Smokeless tobacco: Never Used  Substance Use Topics  . Alcohol use: No    Alcohol/week: 0.0 standard drinks  . Drug use: No    Comment: 08/15/08 UDS + cocaine     Allergies   Hydrocodone and Tape   Review of Systems Review of Systems  Constitutional: Negative for  chills and fever.  HENT: Negative for ear pain and sore throat.   Eyes: Negative for pain and visual disturbance.  Respiratory: Positive for cough and shortness of breath. Negative for hemoptysis and sputum production.   Cardiovascular: Negative for chest pain, palpitations, orthopnea and leg swelling.  Gastrointestinal: Negative for abdominal pain and vomiting.  Genitourinary: Negative for dysuria and hematuria.  Musculoskeletal: Negative for arthralgias and back pain.  Skin: Negative for color change and rash.  Neurological: Negative for seizures, syncope and headaches.  All other systems reviewed and are negative.    Physical Exam Updated Vital Signs  ED Triage Vitals  Enc Vitals Group     BP 04/21/18 1741 (!) 186/79     Pulse Rate 04/21/18 1741 (!) 111     Resp 04/21/18 1741 (!) 21     Temp 04/21/18 1741 (!) 96.3 F (35.7 C)     Temp Source 04/21/18 1741 Oral     SpO2 04/21/18 1741 97 %     Weight 04/21/18 1744 120 lb (54.4 kg)     Height 04/21/18 1744 5\' 2"  (1.575 m)     Head Circumference --      Peak Flow --      Pain Score 04/21/18 1744 0     Pain Loc --      Pain Edu? --      Excl. in [place]? --     Physical Exam  Constitutional: She appears well-developed and well-nourished. No distress.  HENT:  Head: Normocephalic and atraumatic.  Eyes: Pupils are equal, round, and reactive to light. Conjunctivae and EOM are normal.  Neck: Normal range of motion. Neck supple.  Cardiovascular: Normal rate and regular rhythm.  No murmur heard. Pulmonary/Chest: Effort normal and breath sounds normal. No respiratory distress. She has no decreased breath sounds. She has no wheezes. She has no rhonchi. She has no rales.  Abdominal: Soft. Bowel sounds are normal. There is no tenderness.  Musculoskeletal: She exhibits no edema.       Right lower leg: She exhibits no edema.       Left lower leg: She exhibits no edema.  Neurological: She is alert.  Skin: Skin is warm and dry. Capillary  refill takes less than 2 seconds.  Psychiatric: She has a normal mood and affect.  Nursing note and vitals reviewed.    ED Treatments / Results  Labs (all labs ordered are listed, but only abnormal results are displayed) Labs Reviewed  CBC WITH DIFFERENTIAL/PLATELET - Abnormal; Notable for the following components:      Result Value   MCHC 29.4 (*)    RDW 16.0 (*)  Lymphs Abs 0.4 (*)    Abs Immature Granulocytes 0.08 (*)    All other components within normal limits  BASIC METABOLIC PANEL - Abnormal; Notable for the following components:   Glucose, Bld 124 (*)    Creatinine, Ser 4.63 (*)    Calcium 10.4 (*)    GFR calc non Af Amer 8 (*)    GFR calc Af Amer 10 (*)    Anion gap 16 (*)    All other components within normal limits    EKG None  Radiology Dg Chest 2 View  Result Date: 04/21/2018 CLINICAL DATA:  Shortness of breath with exertion. EXAM: CHEST - 2 VIEW COMPARISON:  Chest x-ray dated April 19, 2018. FINDINGS: Unchanged tunneled left internal jugular dialysis catheter with the tip at the cavoatrial junction. Stable cardiomegaly. Improved pulmonary vascular congestion. Resolved small right pleural effusion. No consolidation or pneumothorax. No acute osseous abnormality. IMPRESSION: Improved vascular congestion with resolved right pleural effusion. Electronically Signed   By: Titus Dubin M.D.   On: 04/21/2018 19:48    Procedures Procedures (including critical care time)  Medications Ordered in ED Medications  azithromycin (ZITHROMAX) tablet 500 mg (500 mg Oral Given 04/21/18 1810)  amoxicillin-clavulanate (AUGMENTIN) 500-125 MG per tablet 500 mg (500 mg Oral Given 04/21/18 1956)     Initial Impression / Assessment and Plan / ED Course  I have reviewed the triage vital signs and the nursing notes.  Pertinent labs & imaging results that were available during my care of the patient were reviewed by me and considered in my medical decision making (see chart  for details).     Nancy B Hiebert is a 77 year old female history of heart failure, CKD, CVA who presents to the ED with shortness of breath.  Patient with unremarkable vitals upon arrival.  Patient recently discharged following treatment for pneumonia.  She has not taken her Augmentin or Zithromax today.  She had dialysis yesterday.  She felt some shortness of breath and did not want to come to the ED when EMS was called to her house.  She states that her family member made her come.  Patient overall is well-appearing.  Has clear breath sounds.  No signs of volume overload on exam.  Lab work shows no significant anemia, electrolyte abnormality, creatinine level.  Chest x-ray shows no signs of pneumonia.  No signs of volume overload.  Patient asymptomatic upon my care.  She would like to be discharged home.  She was given her antibiotics here.  She has follow-up with her physician next week.  Understands return precautions.  Discharged from ED in good condition.  Doubt any new cardiac or pulmonary process at this time.  This chart was dictated using voice recognition software.  Despite best efforts to proofread,  errors can occur which can change the documentation meaning.   Final Clinical Impressions(s) / ED Diagnoses   Final diagnoses:  Shortness of breath    ED Discharge Orders    None       Lennice Sites, DO 04/21/18 2007

## 2018-04-21 NOTE — ED Triage Notes (Signed)
Per EMS pt is from home.  She was seen here for SOB yesterday and had dialysis yesterday.  Today she called out for increased SOB and weakness.  GFD had 91% RA EMS put her on 2L of O2 and she was at 100 SpO2.  She does not normally wear O2 at home.  AOx4 NAD noted at this time.  Pt still on 2L of O2.

## 2018-04-21 NOTE — ED Notes (Signed)
ED Provider at bedside. 

## 2018-04-21 NOTE — Progress Notes (Signed)
Left IJ catheter with no dressing noted, old bld drainage. States, " I took a bath," when ask what happen to her dressing. Site cleaned per procedure and biopatch and dressing applied. HD catheter ports clamped and capped. There is one stitch that is intact to catheter wings, but there is a dacron cuff that is outside of skin exit site.This should be followed up with surgeon or Nephrologist. RN aware.

## 2018-04-22 ENCOUNTER — Emergency Department (HOSPITAL_COMMUNITY)
Admission: EM | Admit: 2018-04-22 | Discharge: 2018-04-22 | Disposition: A | Discharge status: Home / Self Care | Payer: Medicare Other | Attending: Emergency Medicine | Admitting: Emergency Medicine

## 2018-04-22 ENCOUNTER — Emergency Department (HOSPITAL_COMMUNITY): Payer: Medicare Other

## 2018-04-22 ENCOUNTER — Telehealth: Payer: Self-pay | Admitting: Internal Medicine

## 2018-04-22 DIAGNOSIS — R531 Weakness: Secondary | ICD-10-CM | POA: Diagnosis not present

## 2018-04-22 DIAGNOSIS — I509 Heart failure, unspecified: Secondary | ICD-10-CM | POA: Insufficient documentation

## 2018-04-22 DIAGNOSIS — R52 Pain, unspecified: Secondary | ICD-10-CM | POA: Diagnosis not present

## 2018-04-22 DIAGNOSIS — I132 Hypertensive heart and chronic kidney disease with heart failure and with stage 5 chronic kidney disease, or end stage renal disease: Secondary | ICD-10-CM | POA: Diagnosis not present

## 2018-04-22 DIAGNOSIS — N186 End stage renal disease: Secondary | ICD-10-CM | POA: Diagnosis not present

## 2018-04-22 DIAGNOSIS — R51 Headache: Secondary | ICD-10-CM | POA: Diagnosis not present

## 2018-04-22 DIAGNOSIS — I10 Essential (primary) hypertension: Secondary | ICD-10-CM | POA: Diagnosis not present

## 2018-04-22 DIAGNOSIS — Z992 Dependence on renal dialysis: Secondary | ICD-10-CM | POA: Insufficient documentation

## 2018-04-22 DIAGNOSIS — Z79899 Other long term (current) drug therapy: Secondary | ICD-10-CM | POA: Insufficient documentation

## 2018-04-22 DIAGNOSIS — R05 Cough: Secondary | ICD-10-CM | POA: Diagnosis not present

## 2018-04-22 LAB — COMPREHENSIVE METABOLIC PANEL
ALBUMIN: 3 g/dL — AB (ref 3.5–5.0)
ALK PHOS: 44 U/L (ref 38–126)
ALT: 9 U/L (ref 0–44)
AST: 12 U/L — ABNORMAL LOW (ref 15–41)
Anion gap: 16 — ABNORMAL HIGH (ref 5–15)
BILIRUBIN TOTAL: 0.5 mg/dL (ref 0.3–1.2)
BUN: 19 mg/dL (ref 8–23)
CALCIUM: 10.3 mg/dL (ref 8.9–10.3)
CO2: 23 mmol/L (ref 22–32)
Chloride: 103 mmol/L (ref 98–111)
Creatinine, Ser: 6.29 mg/dL — ABNORMAL HIGH (ref 0.44–1.00)
GFR calc non Af Amer: 6 mL/min — ABNORMAL LOW (ref >60)
GFR, EST AFRICAN AMERICAN: 7 mL/min — AB (ref >60)
Glucose, Bld: 96 mg/dL (ref 70–99)
POTASSIUM: 3.9 mmol/L (ref 3.5–5.1)
SODIUM: 142 mmol/L (ref 135–145)
TOTAL PROTEIN: 6.3 g/dL — AB (ref 6.5–8.1)

## 2018-04-22 LAB — CBC WITH DIFFERENTIAL/PLATELET
Abs Immature Granulocytes: 0.05 10*3/uL (ref 0.00–0.07)
BASOS ABS: 0 10*3/uL (ref 0.0–0.1)
Basophils Relative: 0 %
Eosinophils Absolute: 0 10*3/uL (ref 0.0–0.5)
Eosinophils Relative: 0 %
HCT: 39.4 % (ref 36.0–46.0)
HEMOGLOBIN: 11.4 g/dL — AB (ref 12.0–15.0)
Immature Granulocytes: 1 %
LYMPHS ABS: 0.6 10*3/uL — AB (ref 0.7–4.0)
Lymphocytes Relative: 10 %
MCH: 28 pg (ref 26.0–34.0)
MCHC: 28.9 g/dL — AB (ref 30.0–36.0)
MCV: 96.8 fL (ref 80.0–100.0)
MONOS PCT: 8 %
Monocytes Absolute: 0.5 10*3/uL (ref 0.1–1.0)
NEUTROS ABS: 5 10*3/uL (ref 1.7–7.7)
NEUTROS PCT: 81 %
Platelets: 158 10*3/uL (ref 150–400)
RBC: 4.07 MIL/uL (ref 3.87–5.11)
RDW: 16.2 % — AB (ref 11.5–15.5)
WBC: 6.1 10*3/uL (ref 4.0–10.5)
nRBC: 0 % (ref 0.0–0.2)

## 2018-04-22 LAB — I-STAT TROPONIN, ED: Troponin i, poc: 0.01 ng/mL (ref 0.00–0.08)

## 2018-04-22 LAB — CULTURE, BLOOD (ROUTINE X 2)
Culture: NO GROWTH
Culture: NO GROWTH

## 2018-04-22 NOTE — ED Notes (Signed)
Pt given ice water.

## 2018-04-22 NOTE — Telephone Encounter (Signed)
Returned call to patient. States she went to dialysis center today but had weakness, fever and chills so dialysis was not done. States she feels awful. Discussed with Attending and patient instructed to head directly to ED. States she will call 911 for transportation as soon as she gets off phone. Hubbard Hartshorn, RN, BSN

## 2018-04-22 NOTE — Telephone Encounter (Signed)
Pt missed dialysis, pt isn't feeling well; pt contact [phone]  Pt also doesn't have an appetite

## 2018-04-22 NOTE — Telephone Encounter (Signed)
Pt is currently in the ED per Epic.

## 2018-04-22 NOTE — Discharge Instructions (Addendum)
Please continue taking the antibiotics that you have been prescribed.  Make sure to stay well-hydrated over the next several days.  Please follow up with your primary care provider within 2-3 days for re-evaluation of your symptoms.   Please return to the emergency department for any new or worsening symptoms.

## 2018-04-22 NOTE — Progress Notes (Signed)
Spoke with Nurse, adult. No intravenous fluids ordered at this time. Notified to re-consult when vascular access needed for infusions. VU. Fran Lowes, RN VAST

## 2018-04-22 NOTE — ED Notes (Signed)
Pt refused IV. Phlebotomy requested to stick pt

## 2018-04-22 NOTE — ED Triage Notes (Signed)
Patient arrived via ems from home. Per ems patient went to dialysis this morning and told them she wasn't feeling well, they sent her home without being dialyzed. Patient called PCP and they told her to come to the hospital. Patient states she's generally hasn't been feeling well, c/o HA at this time. No CP, or SOB. Had new port placed about a month ago.

## 2018-04-22 NOTE — ED Provider Notes (Addendum)
[name] Provider Note   CSN: [id number] Arrival date & time: 04/22/18  1602     History   Chief Complaint No chief complaint on file.   HPI Nancy [name] is a 77 y.o. female.  HPI   Pt is a 77 y/o female with a h/o aortic stenosis, CHF, CKD (on dialysis), CVA, who presents to the ED today via EMS for evaluation of generalized weakness that began earlier today. Reports sweats, chills, productive cough with white sputum, congestion, sore throat, shortness of breath, and nausea. She also c/o headache on the left side of her head. Reports chronic, daily headaches. States pain is severe and somewhat different than her daily chronic headaches.  Reports decreased p.o. intake for the last several days due to not feeling well.  Denies chest pain, abd pain, vomiting, diarrhea, or urinary symptoms.  She attempted to go to dialysis today however they did not dialyze her because she was not feeling well. She then went home and contacted EMS.  Records reviewed. Pt admitted 04/17/18 for sepsis, suspected secondary to possible aspiration pneumonia.  She was discharged on azithromycin and Augmentin. She was again seen in the ED yesterday for shortness of breath.  At that time she voiced that she did not want to be admitted to the ED so she was discharged after administration of Augmentin and azithromycin.  Past Medical History:  Diagnosis Date  . Anemia   . Aortic stenosis   . Bacterial sinusitis 09/17/2011  . CHF (congestive heart failure) ([place])   . CKD (chronic kidney disease) stage 4, GFR 15-29 ml/min ([place]) 08/11/2006   Cr continues to increase. Proteinuria on UA 02/10/12.    . Colitis   . CVA (cerebrovascular accident) [facility])    New hemorrhagic per CT scan '09  . Diverticulosis of colon   . Dysfunctional uterine bleeding   . ESRD (end stage renal disease) on dialysis ([place])    "MWF; [facility]" (11/27/2017)  . Fecal impaction ([place])   .  Headache(784.0)   . Heart murmur   . HERNIORRHAPHY, HX OF 08/11/2006  . Hypertension   . OA (osteoarthritis)    bilateral knees  . Postmenopausal   . Pulmonary nodule   . TINEA CRURIS 01/12/2007    Patient Active Problem List   Diagnosis Date Noted  . Bacterial lobar pneumonia 04/17/2018  . Acute gastritis without hemorrhage   . Duodenitis with bleeding   . ESRD (end stage renal disease) ([place])   . Pityriasis rosea 03/06/2018  . Acute on chronic blood loss anemia 03/06/2018  . Protein-calorie malnutrition, severe 01/12/2018  . Midline thoracic back pain 12/23/2017  . Symptomatic anemia 02/17/2017  . Secondary hyperparathyroidism of renal origin ([place]) 01/15/2017  . Mitral valve annular calcification   . Dizziness 12/08/2016  . Angina at rest [facility])   . Goals of care, counseling/discussion   . Aortic stenosis 05/21/2016  . Anemia associated with chronic renal failure 02/01/2016  . Atherosclerosis of aorta ([place]) 01/11/2015  . End stage renal disease ([place]) 02/04/2013  . Non-intractable vomiting with nausea 08/17/2012  . Glaucoma 03/18/2012  . Health care maintenance 09/17/2011  . Osteoarthrosis involving lower leg 10/31/2008  . History of CVA (cerebrovascular accident) 01/28/2008  . Hyperlipidemia 02/13/2007  . PULMONARY NODULES 01/12/2007  . Left ventricular hypertrophy 09/02/2006  . Essential hypertension 08/11/2006  . GERD 08/11/2006    Past Surgical History:  Procedure Laterality Date  . ABDOMINAL HYSTERECTOMY    . AV FISTULA  PLACEMENT Left 02/19/2017   Procedure: CREATION OF LEFT ARM BRACHIOCEPHALIC ARTERIOVENOUS (AV) FISTULA;  Surgeon: Rosetta Posner, MD;  Location: [facility];  Service: Vascular;  Laterality: Left;  . BASCILIC VEIN TRANSPOSITION Left 04/23/2017   Procedure: BASILIC VEIN TRANSPOSITION SECOND STAGE;  Surgeon: Rosetta Posner, MD;  Location: [place];  Service: Vascular;  Laterality: Left;  . BIOPSY  02/20/2018   Procedure: BIOPSY;  Surgeon: Carol Ada, MD;   Location: [facility];  Service: Endoscopy;;  . BIOPSY  03/07/2018   Procedure: BIOPSY;  Surgeon: Carol Ada, MD;  Location: [facility] ENDOSCOPY;  Service: Endoscopy;;  . CHOLECYSTECTOMY  2009  . COLONOSCOPY    . ESOPHAGOGASTRODUODENOSCOPY N/A 03/07/2018   Procedure: ESOPHAGOGASTRODUODENOSCOPY (EGD);  Surgeon: Carol Ada, MD;  Location: [place];  Service: Endoscopy;  Laterality: N/A;  . ESOPHAGOGASTRODUODENOSCOPY (EGD) WITH PROPOFOL N/A 02/20/2018   Procedure: ESOPHAGOGASTRODUODENOSCOPY (EGD) WITH PROPOFOL;  Surgeon: Carol Ada, MD;  Location: [facility] ENDOSCOPY;  Service: Endoscopy;  Laterality: N/A;  . INGUINAL HERNIA REPAIR  2008  . INSERTION OF DIALYSIS CATHETER Right 02/19/2017   Procedure: INSERTION OF TUNNELED DIALYSIS CATHETER - RIGHT INTERNAL JUGULAR PLACEMENT;  Surgeon: Rosetta Posner, MD;  Location: [place];  Service: Vascular;  Laterality: Right;  . IRIDOTOMY / IRIDECTOMY     Laser, right eye 12/26/11 left eye 01/24/12  . MASS EXCISION Left 05/07/2013   Procedure: EXCISION CYST;  Surgeon: Myrtha Mantis., MD;  Location: [place];  Service: Ophthalmology;  Laterality: Left;  . SAVORY DILATION N/A 02/20/2018   Procedure: SAVORY DILATION;  Surgeon: Carol Ada, MD;  Location: [facility] ENDOSCOPY;  Service: Endoscopy;  Laterality: N/A;     OB History   None      Home Medications    Prior to Admission medications   Medication Sig Start Date End Date Taking? Authorizing Provider  amoxicillin-clavulanate (AUGMENTIN) 500-125 MG tablet Take 1 tablet (500 mg total) by mouth daily. 04/21/18   Masoudi, Dorthula Rue, MD  aspirin EC 81 MG EC tablet Take 1 tablet (81 mg total) by mouth daily. Patient not taking: Reported on 04/07/2018 12/10/16   Kalman Shan Ratliff, DO  azithromycin (ZITHROMAX) 500 MG tablet Take 1 tablet (500 mg total) by mouth daily. 04/20/18   Masoudi, Dorthula Rue, MD  calcitRIOL (ROCALTROL) 0.5 MCG capsule Take 4 capsules (2 mcg total) by mouth every  Monday, Wednesday, and Friday with hemodialysis. Patient not taking: Reported on 04/18/2018 12/30/17   Neva Seat, MD  diclofenac sodium (VOLTAREN) 1 % GEL Apply 4 g topically 4 (four) times daily. Patient not taking: Reported on 04/18/2018 01/20/18   Mosetta Anis, MD  diphenhydrAMINE-zinc acetate (BENADRYL) cream Apply topically 3 (three) times daily as needed for itching. 04/20/18   Masoudi, Dorthula Rue, MD  guaiFENesin (ROBITUSSIN) 100 MG/5ML SOLN Take 5 mLs (100 mg total) by mouth every 4 (four) hours as needed for cough or to loosen phlegm. 04/20/18   Masoudi, Elhamalsadat, MD  hydrocortisone cream 0.5 % Apply 1 application topically 2 (two) times daily. Patient not taking: Reported on 04/18/2018 03/05/18   Lars Mage, MD  latanoprost (XALATAN) 0.005 % ophthalmic solution Place 1 drop into both eyes at bedtime. 04/11/18   [provider]  mirtazapine (REMERON) 15 MG tablet Take 1 tablet (15 mg total) by mouth at bedtime. Patient not taking: Reported on 04/18/2018 02/01/18   [name], MD  nitroGLYCERIN (NITROSTAT) 0.3 MG SL tablet Place 1 tablet (0.3 mg total) under the tongue every 5 (five) minutes as  needed for chest pain. Patient not taking: Reported on 04/18/2018 02/27/17   Sid Falcon, MD  pantoprazole (PROTONIX) 40 MG tablet Take 1 tablet (40 mg total) by mouth daily. Patient taking differently: Take 40 mg by mouth daily as needed (acid reflux).  01/14/18 04/17/18  [name], MD  promethazine (PHENERGAN) 12.5 MG tablet Take 1 tablet (12.5 mg total) by mouth every 6 (six) hours as needed for nausea or vomiting (Take 1 prior to dialysis). Take 1 tablet half an hour before meal. Patient not taking: Reported on 04/18/2018 02/01/18   [name], MD    Family History Family History  Problem Relation Age of Onset  . Hypertension Mother   . Congestive Heart Failure Mother   . Heart attack Brother 8    Social History Social History   Tobacco Use  .  Smoking status: Never Smoker  . Smokeless tobacco: Never Used  Substance Use Topics  . Alcohol use: No    Alcohol/week: 0.0 standard drinks  . Drug use: No    Comment: 08/15/08 UDS + cocaine     Allergies   Hydrocodone and Tape   Review of Systems Review of Systems  Constitutional: Positive for chills and diaphoresis.  HENT: Positive for congestion.   Eyes: Negative for visual disturbance.  Respiratory: Positive for cough and shortness of breath.   Cardiovascular: Negative for chest pain.  Gastrointestinal: Positive for nausea. Negative for abdominal pain, constipation, diarrhea and vomiting.  Genitourinary: Negative for flank pain.  Musculoskeletal: Negative for back pain.  Skin: Negative for rash.  Neurological: Positive for weakness (generalized) and headaches.    Physical Exam Updated Vital Signs BP (!) 172/80   Pulse (!) 101   Temp 98.8 F (37.1 C) (Rectal)   Resp (!) 25   Ht 5\' 2"  (1.575 m)   Wt 54.4 kg   SpO2 100%   BMI 21.94 kg/m   Physical Exam  Constitutional: She appears well-developed and well-nourished. No distress.  HENT:  Head: Normocephalic and atraumatic.  Eyes: Conjunctivae are normal.  Neck: Neck supple.  Cardiovascular: Normal rate and regular rhythm.  Murmur heard. Pulmonary/Chest: Effort normal and breath sounds normal. No stridor. No respiratory distress. She has no wheezes.  Abdominal: Soft. Bowel sounds are normal. She exhibits no distension. There is no tenderness. There is no guarding.  No CVA TTP  Musculoskeletal: She exhibits no edema.  Neurological: She is alert.  Skin: Skin is warm and dry.  Psychiatric: She has a normal mood and affect.  Nursing note and vitals reviewed.   ED Treatments / Results  Labs (all labs ordered are listed, but only abnormal results are displayed) Labs Reviewed  CBC WITH DIFFERENTIAL/PLATELET - Abnormal; Notable for the following components:      Result Value   Hemoglobin 11.4 (*)    MCHC 28.9 (*)     RDW 16.2 (*)    Lymphs Abs 0.6 (*)    All other components within normal limits  COMPREHENSIVE METABOLIC PANEL - Abnormal; Notable for the following components:   Creatinine, Ser 6.29 (*)    Total Protein 6.3 (*)    Albumin 3.0 (*)    AST 12 (*)    GFR calc non Af Amer 6 (*)    GFR calc Af Amer 7 (*)    Anion gap 16 (*)    All other components within normal limits  CULTURE, BLOOD (ROUTINE X 2)  CULTURE, BLOOD (ROUTINE X 2)  I-STAT TROPONIN, ED  EKG EKG Interpretation  Date/Time:  Wednesday April 22 2018 16:16:58 EDT Ventricular Rate:  100 PR Interval:    QRS Duration: 110 QT Interval:  374 QTC Calculation: 483 R Axis:   44 Text Interpretation:  Sinus tachycardia Probable LVH with secondary repol abnrm Anterior ST elevation, probably due to LVH no significant change since yesterday. Confirmed by Sherwood Gambler [phone]) on 04/22/2018 5:13:04 PM   Radiology Dg Chest 2 View  Result Date: 04/22/2018 CLINICAL DATA:  cough EXAM: CHEST - 2 VIEW COMPARISON:  04/21/2018 FINDINGS: Stable left jugular dialysis catheter. Cardiomegaly. Vascular congestion without interstitial edema. No pneumothorax or pleural effusion. IMPRESSION: Cardiomegaly and vascular congestion. Electronically Signed   By: Marybelle Killings M.D.   On: 04/22/2018 19:25   Dg Chest 2 View  Result Date: 04/21/2018 CLINICAL DATA:  Shortness of breath with exertion. EXAM: CHEST - 2 VIEW COMPARISON:  Chest x-ray dated April 19, 2018. FINDINGS: Unchanged tunneled left internal jugular dialysis catheter with the tip at the cavoatrial junction. Stable cardiomegaly. Improved pulmonary vascular congestion. Resolved small right pleural effusion. No consolidation or pneumothorax. No acute osseous abnormality. IMPRESSION: Improved vascular congestion with resolved right pleural effusion. Electronically Signed   By: Titus Dubin M.D.   On: 04/21/2018 19:48   Ct Head Wo Contrast  Result Date: 04/22/2018 CLINICAL DATA:   Acute severe headache. EXAM: CT HEAD WITHOUT CONTRAST TECHNIQUE: Contiguous axial images were obtained from the base of the skull through the vertex without intravenous contrast. COMPARISON:  03/08/2018 and 03/30/2017 head CT FINDINGS: Brain: Remote left inferior cerebellar infarct. Atrophy with chronic small vessel ischemia. No acute intracranial hemorrhage, midline shift or edema. No large vascular territory infarct. No intra-axial mass nor extra-axial fluid. Midline fourth ventricle basal cisterns without effacement. Vascular: Atherosclerosis at the skull base with moderate atherosclerosis of the carotid siphons bilaterally. No hyperdense vessel sign. Skull: Acute skull fracture or suspicious osseous lesions. Sinuses/Orbits: No acute finding. Other: Clear bilateral mastoids. IMPRESSION: Remote left inferior cerebellar infarct. Chronic small vessel ischemia of periventricular white matter. No acute intracranial abnormality. Electronically Signed   By: Ashley Royalty M.D.   On: 04/22/2018 21:08    Procedures Procedures (including critical care time)  Medications Ordered in ED Medications - No data to display   Initial Impression / Assessment and Plan / ED Course  I have reviewed the triage vital signs and the nursing notes.  Pertinent labs & imaging results that were available during my care of the patient were reviewed by me and considered in my medical decision making (see chart for details).      Final Clinical Impressions(s) / ED Diagnoses   Final diagnoses:  Generalized weakness   Pt presenting with generalized weakness. Decreased PO intake for the last several days. Initially borderline tachycardic with hypertension. Normal rectal temp. Otherwise vitals stable.   Labs reassuring without leukocytosis, mild anemia, consistent with chronic. CMP with normal electrolytes and liver function. Kidney function elevated consistent with missed dialysis. Blood cultures in process. Troponin is  negative.  EKG Sinus tachycardia Probable LVH with secondary repol abnrm Anterior ST elevation, probably due to LVH no significant change since yesterday.  CXR with cardiomegaly and vascular congestion. Head CT with remote left inferior cerebellar infarct, but no acute intracranial abnormality.  No evidence of ICH.  Pt seen in conjunction with Dr. Colin Mulders who advises that if labs and imaging studies are WNL then patient is likely safe for discharge.   Patient's work-up is reassuring, she is nontoxic and nonseptic  appearing feel that she is safe for discharge with close outpt f/u. Reevaluated patient discussed findings and plan for discharge.  She is tolerating po in the ED in NAD. Suspect generalized weakness secondary to decreased po intake. Advised increased po intake and increased fluid hydration. She is agreeable with the plan for discharge.  Advised to follow-up with her PCP and to return to the ER for new or worsening symptoms.  She voices understanding the plan reasons to return to ED peer all questions answered  ED Discharge Orders    None       Rodney Booze, PA-C 04/22/18 2133    Rodney Booze, PA-C 04/22/18 2205    Sherwood Gambler, MD 04/22/18 2212

## 2018-04-22 NOTE — ED Notes (Addendum)
Discharge instructions discussed with Pt. Pt verbalized understanding. Pt stable and leaving via WC, states he granddaughter is picking her up.

## 2018-04-22 NOTE — Telephone Encounter (Signed)
I agree

## 2018-04-22 NOTE — ED Notes (Signed)
Pt refusing cardiac monitoring at this time, pt hooked up to pulse ox and blood pressures.

## 2018-04-24 ENCOUNTER — Other Ambulatory Visit: Payer: Self-pay

## 2018-04-24 ENCOUNTER — Emergency Department (HOSPITAL_COMMUNITY)
Admission: EM | Admit: 2018-04-24 | Discharge: 2018-04-24 | Disposition: A | Discharge status: Home / Self Care | Payer: Medicare Other | Attending: Emergency Medicine | Admitting: Emergency Medicine

## 2018-04-24 ENCOUNTER — Encounter (HOSPITAL_COMMUNITY): Payer: Self-pay | Admitting: Emergency Medicine

## 2018-04-24 DIAGNOSIS — D509 Iron deficiency anemia, unspecified: Secondary | ICD-10-CM | POA: Diagnosis not present

## 2018-04-24 DIAGNOSIS — D689 Coagulation defect, unspecified: Secondary | ICD-10-CM | POA: Diagnosis not present

## 2018-04-24 DIAGNOSIS — R52 Pain, unspecified: Secondary | ICD-10-CM | POA: Diagnosis not present

## 2018-04-24 DIAGNOSIS — N186 End stage renal disease: Secondary | ICD-10-CM | POA: Diagnosis not present

## 2018-04-24 DIAGNOSIS — Z5321 Procedure and treatment not carried out due to patient leaving prior to being seen by health care provider: Secondary | ICD-10-CM | POA: Insufficient documentation

## 2018-04-24 DIAGNOSIS — I959 Hypotension, unspecified: Secondary | ICD-10-CM | POA: Diagnosis not present

## 2018-04-24 DIAGNOSIS — N2581 Secondary hyperparathyroidism of renal origin: Secondary | ICD-10-CM | POA: Diagnosis not present

## 2018-04-24 DIAGNOSIS — R51 Headache: Secondary | ICD-10-CM | POA: Diagnosis not present

## 2018-04-24 DIAGNOSIS — Z23 Encounter for immunization: Secondary | ICD-10-CM | POA: Diagnosis not present

## 2018-04-24 DIAGNOSIS — R222 Localized swelling, mass and lump, trunk: Secondary | ICD-10-CM | POA: Diagnosis not present

## 2018-04-24 NOTE — ED Triage Notes (Signed)
Pt arriving my ems from dialysis center, done on MWF. Pt was 3/4 of the way through her tx when her left chest catheter site started getting red and painful. Denies any other sx. Denies pain at this time. Pt reports this happened 4-5x last month.   EMS VS BP 140/70, P 90, 99% RA.

## 2018-04-24 NOTE — ED Notes (Signed)
Called for pt 2x for vitals, pt did not answer and not seen in waiting room

## 2018-04-24 NOTE — ED Notes (Signed)
Pt called for vitals no answer x2

## 2018-04-27 ENCOUNTER — Encounter (HOSPITAL_COMMUNITY): Payer: Self-pay | Admitting: Emergency Medicine

## 2018-04-27 ENCOUNTER — Emergency Department (HOSPITAL_COMMUNITY)
Admission: EM | Admit: 2018-04-27 | Discharge: 2018-04-27 | Disposition: A | Discharge status: Home / Self Care | Payer: Medicare Other | Attending: Emergency Medicine | Admitting: Emergency Medicine

## 2018-04-27 DIAGNOSIS — I509 Heart failure, unspecified: Secondary | ICD-10-CM | POA: Insufficient documentation

## 2018-04-27 DIAGNOSIS — Z452 Encounter for adjustment and management of vascular access device: Secondary | ICD-10-CM | POA: Insufficient documentation

## 2018-04-27 DIAGNOSIS — Z79899 Other long term (current) drug therapy: Secondary | ICD-10-CM | POA: Insufficient documentation

## 2018-04-27 DIAGNOSIS — Z23 Encounter for immunization: Secondary | ICD-10-CM | POA: Diagnosis not present

## 2018-04-27 DIAGNOSIS — I132 Hypertensive heart and chronic kidney disease with heart failure and with stage 5 chronic kidney disease, or end stage renal disease: Secondary | ICD-10-CM | POA: Insufficient documentation

## 2018-04-27 DIAGNOSIS — N186 End stage renal disease: Secondary | ICD-10-CM | POA: Diagnosis not present

## 2018-04-27 DIAGNOSIS — N2581 Secondary hyperparathyroidism of renal origin: Secondary | ICD-10-CM | POA: Diagnosis not present

## 2018-04-27 DIAGNOSIS — Z743 Need for continuous supervision: Secondary | ICD-10-CM | POA: Diagnosis not present

## 2018-04-27 DIAGNOSIS — Z992 Dependence on renal dialysis: Secondary | ICD-10-CM | POA: Diagnosis not present

## 2018-04-27 DIAGNOSIS — R51 Headache: Secondary | ICD-10-CM | POA: Diagnosis not present

## 2018-04-27 DIAGNOSIS — R58 Hemorrhage, not elsewhere classified: Secondary | ICD-10-CM | POA: Diagnosis not present

## 2018-04-27 DIAGNOSIS — D689 Coagulation defect, unspecified: Secondary | ICD-10-CM | POA: Diagnosis not present

## 2018-04-27 DIAGNOSIS — T829XXA Unspecified complication of cardiac and vascular prosthetic device, implant and graft, initial encounter: Secondary | ICD-10-CM

## 2018-04-27 DIAGNOSIS — T8249XA Other complication of vascular dialysis catheter, initial encounter: Secondary | ICD-10-CM | POA: Diagnosis not present

## 2018-04-27 DIAGNOSIS — D509 Iron deficiency anemia, unspecified: Secondary | ICD-10-CM | POA: Diagnosis not present

## 2018-04-27 LAB — CULTURE, BLOOD (ROUTINE X 2)
Culture: NO GROWTH
Special Requests: ADEQUATE

## 2018-04-27 NOTE — ED Notes (Signed)
Patient verbalizes understanding of medications and discharge instructions. No further questions at this time. VSS and patient ambulatory at discharge.   

## 2018-04-27 NOTE — ED Provider Notes (Signed)
[facility]ERGENCY DEPARTMENT Provider Note   CSN: [id number] Arrival date & time: 04/27/18  8144     History   Chief Complaint Chief Complaint  Patient presents with  . Vascular Access Problem    HPI Nancy [name] is a 77 y.o. female.  Patient to ED via EMS called when she woke from sleep and found her bed was soaked in blood after her left chest dialysis port came out while she was asleep. The patient denies pain. EMS reports a 150 cc blood loss. No bleeding now. She has a fistula in the left upper arm that has been avoided because of a skin infection. She dialyzes M, W, F and reports she missed Friday because she was sick and came to the emergency department. No SOB, swelling, fever. She is not lightheaded or dizzy.  The history is provided by the patient and the EMS personnel. No language interpreter was used.    Past Medical History:  Diagnosis Date  . Anemia   . Aortic stenosis   . Bacterial sinusitis 09/17/2011  . CHF (congestive heart failure) ([place])   . CKD (chronic kidney disease) stage 4, GFR 15-29 ml/min ([place]) 08/11/2006   Cr continues to increase. Proteinuria on UA 02/10/12.    . Colitis   . CVA (cerebrovascular accident) [facility])    New hemorrhagic per CT scan '09  . Diverticulosis of colon   . Dysfunctional uterine bleeding   . ESRD (end stage renal disease) on dialysis ([place])    "MWF; [facility]" (11/27/2017)  . Fecal impaction ([place])   . Headache(784.0)   . Heart murmur   . HERNIORRHAPHY, HX OF 08/11/2006  . Hypertension   . OA (osteoarthritis)    bilateral knees  . Postmenopausal   . Pulmonary nodule   . TINEA CRURIS 01/12/2007    Patient Active Problem List   Diagnosis Date Noted  . Bacterial lobar pneumonia 04/17/2018  . Acute gastritis without hemorrhage   . Duodenitis with bleeding   . ESRD (end stage renal disease) ([place])   . Pityriasis rosea 03/06/2018  . Acute on chronic blood loss anemia 03/06/2018  . Protein-calorie  malnutrition, severe 01/12/2018  . Midline thoracic back pain 12/23/2017  . Symptomatic anemia 02/17/2017  . Secondary hyperparathyroidism of renal origin ([place]) 01/15/2017  . Mitral valve annular calcification   . Dizziness 12/08/2016  . Angina at rest [facility])   . Goals of care, counseling/discussion   . Aortic stenosis 05/21/2016  . Anemia associated with chronic renal failure 02/01/2016  . Atherosclerosis of aorta ([place]) 01/11/2015  . End stage renal disease ([place]) 02/04/2013  . Non-intractable vomiting with nausea 08/17/2012  . Glaucoma 03/18/2012  . Health care maintenance 09/17/2011  . Osteoarthrosis involving lower leg 10/31/2008  . History of CVA (cerebrovascular accident) 01/28/2008  . Hyperlipidemia 02/13/2007  . PULMONARY NODULES 01/12/2007  . Left ventricular hypertrophy 09/02/2006  . Essential hypertension 08/11/2006  . GERD 08/11/2006    Past Surgical History:  Procedure Laterality Date  . ABDOMINAL HYSTERECTOMY    . AV FISTULA PLACEMENT Left 02/19/2017   Procedure: CREATION OF LEFT ARM BRACHIOCEPHALIC ARTERIOVENOUS (AV) FISTULA;  Surgeon: Rosetta Posner, MD;  Location: [place];  Service: Vascular;  Laterality: Left;  . BASCILIC VEIN TRANSPOSITION Left 04/23/2017   Procedure: BASILIC VEIN TRANSPOSITION SECOND STAGE;  Surgeon: Rosetta Posner, MD;  Location: [place];  Service: Vascular;  Laterality: Left;  . BIOPSY  02/20/2018   Procedure: BIOPSY;  Surgeon: Carol Ada,  MD;  Location: [facility] ENDOSCOPY;  Service: Endoscopy;;  . BIOPSY  03/07/2018   Procedure: BIOPSY;  Surgeon: Carol Ada, MD;  Location[facility] END[facility]COPY;  Service: Endoscopy;;  . CHOLECYSTECTOMY  2009  . COLONOSCOPY    . ESOPHAGOGASTRODUODENOSCOPY N/A 03/07/2018   Procedure: ESOPHAGOGASTRODUODENOSCOPY (EGD);  Surgeon: Carol Ada, MD;  Location: [place];  Service: Endoscopy;  Laterality: N/A;  . ESOPHAGOGASTRODUODENOSCOPY (EGD) WITH PROPOFOL N/A 02/20/2018   Procedure: ESOPHAGOGASTRODUODENOSCOPY (EGD) WITH  PROPOFOL;  Surgeon: Carol Ada, MD;  Location: [facility] ENDOSCOPY;  Service: Endoscopy;  Laterality: N/A;  . INGUINAL HERNIA REPAIR  2008  . INSERTION OF DIALYSIS CATHETER Right 02/19/2017   Procedure: INSERTION OF TUNNELED DIALYSIS CATHETER - RIGHT INTERNAL JUGULAR PLACEMENT;  Surgeon: Rosetta Posner, MD;  Location: [place];  Service: Vascular;  Laterality: Right;  . IRIDOTOMY / IRIDECTOMY     Laser, right eye 12/26/11 left eye 01/24/12  . MASS EXCISION Left 05/07/2013   Procedure: EXCISION CYST;  Surgeon: Myrtha Mantis., MD;  Location: [place];  Service: Ophthalmology;  Laterality: Left;  . SAVORY DILATION N/A 02/20/2018   Procedure: SAVORY DILATION;  Surgeon: Carol Ada, MD;  Location: [facility] END[facility];  Service: Endoscopy;  Laterality: N/A;     OB History   None      Home Medications    Prior to Admission medications   Medication Sig Start Date End Date Taking? Authorizing Provider  amoxicillin-clavulanate (AUGMENTIN) 500-125 MG tablet Take 1 tablet (500 mg total) by mouth daily. 04/21/18   Masoudi, Dorthula Rue, MD  aspirin EC 81 MG EC tablet Take 1 tablet (81 mg total) by mouth daily. Patient not taking: Reported on 04/07/2018 12/10/16   Kalman Shan Ratliff, DO  azithromycin (ZITHROMAX) 500 MG tablet Take 1 tablet (500 mg total) by mouth daily. 04/20/18   Masoudi, Dorthula Rue, MD  calcitRIOL (ROCALTROL) 0.5 MCG capsule Take 4 capsules (2 mcg total) by mouth every Monday, Wednesday, and Friday with hemodialysis. Patient not taking: Reported on 04/18/2018 12/30/17   Neva Seat, MD  diclofenac sodium (VOLTAREN) 1 % GEL Apply 4 g topically 4 (four) times daily. Patient not taking: Reported on 04/18/2018 01/20/18   Mosetta Anis, MD  diphenhydrAMINE-zinc acetate (BENADRYL) cream Apply topically 3 (three) times daily as needed for itching. 04/20/18   Masoudi, Dorthula Rue, MD  guaiFENesin (ROBITUSSIN) 100 MG/5ML SOLN Take 5 mLs (100 mg total) by mouth every 4  (four) hours as needed for cough or to loosen phlegm. 04/20/18   Masoudi, Elhamalsadat, MD  hydrocortisone cream 0.5 % Apply 1 application topically 2 (two) times daily. Patient not taking: Reported on 04/18/2018 03/05/18   Lars Mage, MD  latanoprost (XALATAN) 0.005 % ophthalmic solution Place 1 drop into both eyes at bedtime. 04/11/18   [provider]  mirtazapine (REMERON) 15 MG tablet Take 1 tablet (15 mg total) by mouth at bedtime. Patient not taking: Reported on 04/18/2018 02/01/18   [name], MD  nitroGLYCERIN (NITROSTAT) 0.3 MG SL tablet Place 1 tablet (0.3 mg total) under the tongue every 5 (five) minutes as needed for chest pain. Patient not taking: Reported on 04/18/2018 02/27/17   Sid Falcon, MD  pantoprazole (PROTONIX) 40 MG tablet Take 1 tablet (40 mg total) by mouth daily. Patient taking differently: Take 40 mg by mouth daily as needed (acid reflux).  01/14/18 04/17/18  [name], MD  promethazine (PHENERGAN) 12.5 MG tablet Take 1 tablet (12.5 mg total) by mouth every 6 (six) hours as needed for nausea  or vomiting (Take 1 prior to dialysis). Take 1 tablet half an hour before meal. Patient not taking: Reported on 04/18/2018 02/01/18   [name], MD    Family History Family History  Problem Relation Age of Onset  . Hypertension Mother   . Congestive Heart Failure Mother   . Heart attack Brother 11    Social History Social History   Tobacco Use  . Smoking status: Never Smoker  . Smokeless tobacco: Never Used  Substance Use Topics  . Alcohol use: No    Alcohol/week: 0.0 standard drinks  . Drug use: No    Comment: 08/15/08 UDS + cocaine     Allergies   Hydrocodone and Tape   Review of Systems Review of Systems  Constitutional: Negative for fever.  Respiratory: Negative for shortness of breath.   Cardiovascular:       See HPI.  Gastrointestinal: Negative for abdominal pain and nausea.  Musculoskeletal: Negative for neck pain.    Neurological: Negative for weakness and light-headedness.     Physical Exam Updated Vital Signs Temp 98.1 F (36.7 C) (Oral)   Ht 5\' 3"  (1.6 m)   Wt 54.4 kg   BMI 21.26 kg/m   Physical Exam  Constitutional: She is oriented to person, place, and time. She appears well-developed and well-nourished. No distress.  Cardiovascular: Normal rate.  Pulmonary/Chest: Effort normal and breath sounds normal. She has no wheezes.  Left upper chest portacath completely dislodged. Site of insertion unremarkable. No bleeding, redness, swelling.  Abdominal: There is no tenderness.  Musculoskeletal: Normal range of motion.  Dialysis graft to upper left arm with palpable thrill. Non-tender. No redness or swelling.   Neurological: She is alert and oriented to person, place, and time.  Skin: Skin is warm and dry.  Skin over left UE dialysis graft is mildly thickened, hyperpigmented. No skin breakdown, drainage.      ED Treatments / Results  Labs (all labs ordered are listed, but only abnormal results are displayed) Labs Reviewed - No data to display  EKG None  Radiology No results found.  Procedures Procedures (including critical care time)  Medications Ordered in ED Medications - No data to display   Initial Impression / Assessment and Plan / ED Course  I have reviewed the triage vital signs and the nursing notes.  Pertinent labs & imaging results that were available during my care of the patient were reviewed by me and considered in my medical decision making (see chart for details).     Patient to ED after left chest port for dialysis became dislodged tonight in her sleep. No current bleeding.   Chart reviewed. Per note of vascular surgeon, Fortunato Curling, dated 04/07/18, the skin condition over the fistula of the left UE appeared healed and the graft could be used for dialysis at that point. The fistula in the arm has a palpable thrill and overlying skin integrity is intact.    Nephrology paged regarding the patient using the fistula for her dialysis which is due today. Dr. Melvia Heaps paged.   Discussed with Dr. Melvia Heaps who advises the port will not need to be replaced. They will attempt to use the fistula today at dialysis.   The final holding suture was removed and the port disposed of. She is encouraged to follow up as scheduled today for dialysis.   Final Clinical Impressions(s) / ED Diagnoses   Final diagnoses:  None   1. Port-a-cath complication  ED Discharge Orders    None  Charlann Lange, PA-C 04/27/18 7225    Veryl Speak, MD 04/27/18 [phone]

## 2018-04-27 NOTE — ED Triage Notes (Signed)
Brought by ems from home.  Reports waking up feeling something cold.  Dialysis catheter to left chest dislodged and bleeding.  Per ems approximately 150cc's of blood in bed.

## 2018-04-27 NOTE — Discharge Instructions (Addendum)
You can go to your regularly scheduled dialysis appointment today where they will use your left arm fistula for your treatment. The port in the left chest will not need to be replaced tonight.

## 2018-04-28 ENCOUNTER — Ambulatory Visit (INDEPENDENT_AMBULATORY_CARE_PROVIDER_SITE_OTHER): Payer: Medicare Other | Admitting: Internal Medicine

## 2018-04-28 ENCOUNTER — Other Ambulatory Visit: Payer: Self-pay

## 2018-04-28 VITALS — BP 115/50 | HR 94 | Temp 97.6°F | Ht 63.0 in | Wt 123.7 lb

## 2018-04-28 DIAGNOSIS — Z8701 Personal history of pneumonia (recurrent): Secondary | ICD-10-CM

## 2018-04-28 DIAGNOSIS — R059 Cough, unspecified: Secondary | ICD-10-CM

## 2018-04-28 DIAGNOSIS — L989 Disorder of the skin and subcutaneous tissue, unspecified: Secondary | ICD-10-CM

## 2018-04-28 DIAGNOSIS — L280 Lichen simplex chronicus: Secondary | ICD-10-CM

## 2018-04-28 DIAGNOSIS — R011 Cardiac murmur, unspecified: Secondary | ICD-10-CM

## 2018-04-28 DIAGNOSIS — H40029 Open angle with borderline findings, high risk, unspecified eye: Secondary | ICD-10-CM | POA: Diagnosis not present

## 2018-04-28 DIAGNOSIS — H40053 Ocular hypertension, bilateral: Secondary | ICD-10-CM | POA: Diagnosis not present

## 2018-04-28 DIAGNOSIS — R05 Cough: Secondary | ICD-10-CM | POA: Diagnosis not present

## 2018-04-28 DIAGNOSIS — R053 Chronic cough: Secondary | ICD-10-CM

## 2018-04-28 NOTE — Progress Notes (Signed)
   CC: Hospital discharge follow-up for community-acquired pneumonia  HPI:[name] is a 77 y.o. female who presents for evaluation of resolution of her community acquired pneumonia. Please see individual problem based A/P for details.  CAP and chronic cough: Patient was admitted and treated successfully with resolution of her symptoms most consistent with community acquired pneumonia with discharge on 04/20/2018 after being treated with Augmentin and azithromycin.  She denies recurrent fever but continues to endorse persistent cough x6 months.  The cough is unproductive, mild, intermittent, and nonspecific in nature.  She denies fever, chills, nasal discharge, ear pain, chest pain, or palpitations.  She was never a smoker and is not currently on an ACE inhibitor. Differential of the patient's cough includes GERD, upper airway cough syndrome, non-eosinophilic bronchitis, asthma and lung cancer.  Recent unremarkable x-ray for the consideration for evaluation of asthma will be recommended for further evaluation as she is treated for GERD with PPI.  Plan: Given adequate time for resolution of her CAP symptoms I would recommend evaluation with spirometry possible asthma. Consider environmental exposure evaluation Consider high-resolution CT scan  Hyperkeratotic lesion on back: See image media file.  Located left of the spine, in the lower thoracic region. Nonspecific structure with a hyperpigmented base extending 1.5 cm circumferentially from the center of the lesion which is poorly defined has a pedunculated hyperkeratotic structure surrounded by a hyperkeratotic raised papule (papular lichenification).  Plan:  I believe that the patient would most benefit from a dermatology referral for evaluation for diagnosis and removal.  PHQ-9: Based on the patients    Office Visit from 04/28/2018 in [place]  PHQ-9 Total Score  5     score we have decided to  monitor.  Past Medical History:  Diagnosis Date  . Anemia   . Aortic stenosis   . Bacterial sinusitis 09/17/2011  . CHF (congestive heart failure) ([place])   . CKD (chronic kidney disease) stage 4, GFR 15-29 ml/min ([place]) 08/11/2006   Cr continues to increase. Proteinuria on UA 02/10/12.    . Colitis   . CVA (cerebrovascular accident) [facility])    New hemorrhagic per CT scan '09  . Diverticulosis of colon   . Dysfunctional uterine bleeding   . ESRD (end stage renal disease) on dialysis ([place])    "MWF; [facility]" (11/27/2017)  . Fecal impaction ([place])   . Headache(784.0)   . Heart murmur   . HERNIORRHAPHY, HX OF 08/11/2006  . Hypertension   . OA (osteoarthritis)    bilateral knees  . Postmenopausal   . Pulmonary nodule   . TINEA CRURIS 01/12/2007   Review of Systems: ROS negative except as per HPI  Physical Exam: Vitals:   04/28/18 1126  BP: (!) 115/50  Pulse: 94  Temp: 97.6 F (36.4 C)  TempSrc: Oral  SpO2: 100%  Weight: 123 lb 11.2 oz (56.1 kg)  Height: 5\' 3"  (1.6 m)   General: A/O x4, no acute distress, afebrile, nondiaphoretic Cardio: RRR, there is a grade 3/4 early systolic ejection murmur best heard at the left sternal border Pulmonary: Clear to auscultation bilaterally MSK: BLE are nontender and nonedematous  Assessment & Plan:   See Encounters Tab for problem based charting.  Patient discussed with Dr. Lynnae January

## 2018-04-28 NOTE — Patient Instructions (Signed)
FOLLOW-UP INSTRUCTIONS When: 4-6 wks For: Routine with your doctor, Dr. Dareen Piano What to bring: All of your medications   I have made no changes to your medications today. I have placed a referral to dermatology today.  Today we discussed your chronic cough and the small lesion on your back. I recommend that you continue to take your current medication for the cough. With regard to the back lesion, please cover this with a bandaid as needed to reduce the movement of the central portion to reduce pain. Also, please follow-up with the dermatology referral that has been placed.   Thank you for your visit to the [facility] today. If you have any questions or concerns please call us at [phone].

## 2018-04-29 ENCOUNTER — Emergency Department (HOSPITAL_COMMUNITY)
Admission: EM | Admit: 2018-04-29 | Discharge: 2018-04-29 | Disposition: A | Discharge status: Home / Self Care | Payer: Medicare Other | Attending: Emergency Medicine | Admitting: Emergency Medicine

## 2018-04-29 ENCOUNTER — Encounter (HOSPITAL_COMMUNITY): Payer: Self-pay | Admitting: Emergency Medicine

## 2018-04-29 DIAGNOSIS — Z79899 Other long term (current) drug therapy: Secondary | ICD-10-CM | POA: Diagnosis not present

## 2018-04-29 DIAGNOSIS — I509 Heart failure, unspecified: Secondary | ICD-10-CM | POA: Insufficient documentation

## 2018-04-29 DIAGNOSIS — M5489 Other dorsalgia: Secondary | ICD-10-CM | POA: Diagnosis not present

## 2018-04-29 DIAGNOSIS — N186 End stage renal disease: Secondary | ICD-10-CM | POA: Diagnosis not present

## 2018-04-29 DIAGNOSIS — R05 Cough: Secondary | ICD-10-CM | POA: Diagnosis not present

## 2018-04-29 DIAGNOSIS — Z992 Dependence on renal dialysis: Secondary | ICD-10-CM | POA: Diagnosis not present

## 2018-04-29 DIAGNOSIS — Z7982 Long term (current) use of aspirin: Secondary | ICD-10-CM | POA: Insufficient documentation

## 2018-04-29 DIAGNOSIS — M545 Low back pain, unspecified: Secondary | ICD-10-CM

## 2018-04-29 DIAGNOSIS — I132 Hypertensive heart and chronic kidney disease with heart failure and with stage 5 chronic kidney disease, or end stage renal disease: Secondary | ICD-10-CM | POA: Insufficient documentation

## 2018-04-29 MED ORDER — ACETAMINOPHEN 325 MG PO TABS
650.0000 mg | ORAL_TABLET | Freq: Once | ORAL | Status: AC
  Administered 2018-04-29: 650 mg via ORAL
  Filled 2018-04-29: qty 2

## 2018-04-29 NOTE — Assessment & Plan Note (Signed)
CAP and chronic cough: Patient was admitted and treated successfully with resolution of her symptoms most consistent with community acquired pneumonia with discharge on 04/20/2018 after being treated with Augmentin and azithromycin.  She denies recurrent fever but continues to endorse persistent cough x6 months.  The cough is unproductive, mild, intermittent, and nonspecific in nature.  She denies fever, chills, nasal discharge, ear pain, chest pain, or palpitations.  She was never a smoker and is not currently on an ACE inhibitor. Differential of the patient's cough includes GERD, upper airway cough syndrome, non-eosinophilic bronchitis, asthma and lung cancer.  Recent unremarkable x-ray for the consideration for evaluation of asthma will be recommended for further evaluation as she is treated for GERD with PPI.  Plan: Given adequate time for resolution of her CAP symptoms I would recommend evaluation with spirometry possible asthma. Consider environmental exposure evaluation Consider high-resolution CT scan

## 2018-04-29 NOTE — ED Provider Notes (Signed)
[facility] Provider Note   CSN: [id number] Arrival date & time: 04/29/18  8366     History   Chief Complaint Chief Complaint  Patient presents with  . Back Pain    HPI Nancy [name] is a 77 y.o. female.  HPI   She complains of spontaneous onset of low back pain, during the night last night.  The pain is moderate and it hurts to move.  She did not take anything for it.  She is also concerned about a skin lesion on her mid back which was addressed yesterday by her PCP when she was evaluated in their office.  For this she was reportedly referred to dermatology.  The clinic note is not complete.  Patient denies fall, or other injury to her back.  She denies weakness or dizziness.  She plans on going to her dialysis appointment later this morning.  There are no other known modifying factors. Past Medical History:  Diagnosis Date  . Anemia   . Aortic stenosis   . Bacterial sinusitis 09/17/2011  . CHF (congestive heart failure) ([place])   . CKD (chronic kidney disease) stage 4, GFR 15-29 ml/min ([place]) 08/11/2006   Cr continues to increase. Proteinuria on UA 02/10/12.    . Colitis   . CVA (cerebrovascular accident) [facility])    New hemorrhagic per CT scan '09  . Diverticulosis of colon   . Dysfunctional uterine bleeding   . ESRD (end stage renal disease) on dialysis ([place])    "MWF; [facility]" (11/27/2017)  . Fecal impaction ([place])   . Headache(784.0)   . Heart murmur   . HERNIORRHAPHY, HX OF 08/11/2006  . Hypertension   . OA (osteoarthritis)    bilateral knees  . Postmenopausal   . Pulmonary nodule   . TINEA CRURIS 01/12/2007    Patient Active Problem List   Diagnosis Date Noted  . Bacterial lobar pneumonia 04/17/2018  . Acute gastritis without hemorrhage   . Duodenitis with bleeding   . ESRD (end stage renal disease) ([place])   . Pityriasis rosea 03/06/2018  . Acute on chronic blood loss anemia 03/06/2018  . Protein-calorie malnutrition, severe  01/12/2018  . Midline thoracic back pain 12/23/2017  . Symptomatic anemia 02/17/2017  . Secondary hyperparathyroidism of renal origin ([place]) 01/15/2017  . Mitral valve annular calcification   . Dizziness 12/08/2016  . Angina at rest [facility])   . Goals of care, counseling/discussion   . Aortic stenosis 05/21/2016  . Anemia associated with chronic renal failure 02/01/2016  . Atherosclerosis of aorta ([place]) 01/11/2015  . End stage renal disease ([place]) 02/04/2013  . Non-intractable vomiting with nausea 08/17/2012  . Glaucoma 03/18/2012  . Health care maintenance 09/17/2011  . Osteoarthrosis involving lower leg 10/31/2008  . History of CVA (cerebrovascular accident) 01/28/2008  . Hyperlipidemia 02/13/2007  . PULMONARY NODULES 01/12/2007  . Left ventricular hypertrophy 09/02/2006  . Essential hypertension 08/11/2006  . GERD 08/11/2006    Past Surgical History:  Procedure Laterality Date  . ABDOMINAL HYSTERECTOMY    . AV FISTULA PLACEMENT Left 02/19/2017   Procedure: CREATION OF LEFT ARM BRACHIOCEPHALIC ARTERIOVENOUS (AV) FISTULA;  Surgeon: Rosetta Posner, MD;  Location: [facility];  Service: Vascular;  Laterality: Left;  . BASCILIC VEIN TRANSPOSITION Left 04/23/2017   Procedure: BASILIC VEIN TRANSPOSITION SECOND STAGE;  Surgeon: Rosetta Posner, MD;  Location: [facility];  Service: Vascular;  Laterality: Left;  . BIOPSY  02/20/2018   Procedure: BIOPSY;  Surgeon: Carol Ada,  MD;  Location: [facility] ENDOSCOPY;  Service: Endoscopy;;  . BIOPSY  03/07/2018   Procedure: BIOPSY;  Surgeon: Carol Ada, MD;  Location: [facility] ENDOSCOPY;  Service: Endoscopy;;  . CHOLECYSTECTOMY  2009  . COLONOSCOPY    . ESOPHAGOGASTRODUODENOSCOPY N/A 03/07/2018   Procedure: ESOPHAGOGASTRODUODENOSCOPY (EGD);  Surgeon: Carol Ada, MD;  Location: [facility];  Service: Endoscopy;  Laterality: N/A;  . ESOPHAGOGASTRODUODENOSCOPY (EGD) WITH PROPOFOL N/A 02/20/2018   Procedure: ESOPHAGOGASTRODUODENOSCOPY (EGD) WITH PROPOFOL;  Surgeon: Carol Ada, MD;  Location: [facility]OS[facility];  Service: Endoscopy;  Laterality: N/A;  . INGUINAL HERNIA REPAIR  2008  . INSERTION OF DIALYSIS CATHETER Right 02/19/2017   Procedure: INSERTION OF TUNNELED DIALYSIS CATHETER - RIGHT INTERNAL JUGULAR PLACEMENT;  Surgeon: Rosetta Posner, MD;  Location: [facility];  Service: Vascular;  Laterality: Right;  . IRIDOTOMY / IRIDECTOMY     Laser, right eye 12/26/11 left eye 01/24/12  . MASS EXCISION Left 05/07/2013   Procedure: EXCISION CYST;  Surgeon: Myrtha Mantis., MD;  Location: [facility];  Service: Ophthalmology;  Laterality: Left;  . SAVORY DILATION N/A 02/20/2018   Procedure: SAVORY DILATION;  Surgeon: Carol Ada, MD;  Location: [facility];  Service: Endoscopy;  Laterality: N/A;     OB History   None      Home Medications    Prior to Admission medications   Medication Sig Start Date End Date Taking? Authorizing Provider  amoxicillin-clavulanate (AUGMENTIN) 500-125 MG tablet Take 1 tablet (500 mg total) by mouth daily. 04/21/18   Masoudi, Dorthula Rue, MD  aspirin EC 81 MG EC tablet Take 1 tablet (81 mg total) by mouth daily. Patient not taking: Reported on 04/07/2018 12/10/16   Kalman Shan Ratliff, DO  azithromycin (ZITHROMAX) 500 MG tablet Take 1 tablet (500 mg total) by mouth daily. 04/20/18   Masoudi, Dorthula Rue, MD  calcitRIOL (ROCALTROL) 0.5 MCG capsule Take 4 capsules (2 mcg total) by mouth every Monday, Wednesday, and Friday with hemodialysis. Patient not taking: Reported on 04/18/2018 12/30/17   Neva Seat, MD  diclofenac sodium (VOLTAREN) 1 % GEL Apply 4 g topically 4 (four) times daily. Patient not taking: Reported on 04/18/2018 01/20/18   Mosetta Anis, MD  diphenhydrAMINE-zinc acetate (BENADRYL) cream Apply topically 3 (three) times daily as needed for itching. 04/20/18   Masoudi, Dorthula Rue, MD  guaiFENesin (ROBITUSSIN) 100 MG/5ML SOLN Take 5 mLs (100 mg total) by mouth every 4 (four) hours as needed for  cough or to loosen phlegm. 04/20/18   Masoudi, Elhamalsadat, MD  hydrocortisone cream 0.5 % Apply 1 application topically 2 (two) times daily. Patient not taking: Reported on 04/18/2018 03/05/18   Lars Mage, MD  latanoprost (XALATAN) 0.005 % ophthalmic solution Place 1 drop into both eyes at bedtime. 04/11/18   [provider]  mirtazapine (REMERON) 15 MG tablet Take 1 tablet (15 mg total) by mouth at bedtime. Patient not taking: Reported on 04/18/2018 02/01/18   [name], MD  nitroGLYCERIN (NITROSTAT) 0.3 MG SL tablet Place 1 tablet (0.3 mg total) under the tongue every 5 (five) minutes as needed for chest pain. Patient not taking: Reported on 04/18/2018 02/27/17   Sid Falcon, MD  pantoprazole (PROTONIX) 40 MG tablet Take 1 tablet (40 mg total) by mouth daily. Patient taking differently: Take 40 mg by mouth daily as needed (acid reflux).  01/14/18 04/17/18  [name], MD  promethazine (PHENERGAN) 12.5 MG tablet Take 1 tablet (12.5 mg total) by mouth every 6 (six) hours as needed for nausea  or vomiting (Take 1 prior to dialysis). Take 1 tablet half an hour before meal. Patient not taking: Reported on 04/18/2018 02/01/18   [name], MD    Family History Family History  Problem Relation Age of Onset  . Hypertension Mother   . Congestive Heart Failure Mother   . Heart attack Brother 18    Social History Social History   Tobacco Use  . Smoking status: Never Smoker  . Smokeless tobacco: Never Used  Substance Use Topics  . Alcohol use: No    Alcohol/week: 0.0 standard drinks  . Drug use: No    Comment: 08/15/08 UDS + cocaine     Allergies   Hydrocodone and Tape   Review of Systems Review of Systems  All other systems reviewed and are negative.    Physical Exam Updated Vital Signs BP 136/77 (BP Location: Right Arm)   Pulse 94   Temp 97.8 F (36.6 C) (Oral)   Resp 16   SpO2 100%   Physical Exam  Constitutional: She is oriented to person, place,  and time. She appears well-developed. No distress.  Elderly, frail  HENT:  Head: Normocephalic and atraumatic.  Eyes: Pupils are equal, round, and reactive to light. Conjunctivae and EOM are normal.  Neck: Normal range of motion and phonation normal. Neck supple.  Cardiovascular: Normal rate and regular rhythm.  Pulmonary/Chest: Effort normal and breath sounds normal. No respiratory distress. She exhibits no tenderness.  Abdominal: There is no guarding.  Musculoskeletal: Normal range of motion. She exhibits no edema or deformity.  Negative straight leg raising bilaterally.  Neurological: She is alert and oriented to person, place, and time. She exhibits normal muscle tone.  Skin: Skin is warm and dry.  Cutaneous lesion lower mid thoracic region, consistent with nonspecific age-related skin disorder.  No associated fluctuance, drainage or bleeding.  Psychiatric: She has a normal mood and affect. Her behavior is normal.  Nursing note and vitals reviewed.    ED Treatments / Results  Labs (all labs ordered are listed, but only abnormal results are displayed) Labs Reviewed - No data to display  EKG None  Radiology No results found.  Procedures Procedures (including critical care time)  Medications Ordered in ED Medications  acetaminophen (TYLENOL) tablet 650 mg (has no administration in time range)     Initial Impression / Assessment and Plan / ED Course  I have reviewed the triage vital signs and the nursing notes.  Pertinent labs & imaging results that were available during my care of the patient were reviewed by me and considered in my medical decision making (see chart for details).      Patient Vitals for the past 24 hrs:  BP Temp Temp src Pulse Resp SpO2  04/29/18 0716 136/77 97.8 F (36.6 C) Oral 94 16 100 %    7:47 AM Reevaluation with update and discussion. After initial assessment and treatment, an updated evaluation reveals no change in clinical status.   Findings discussed with the patient and all questions were answered. Daleen Bo   Medical Decision Making: Atraumatic low back pain, tender on exam, without signs of spinal myelopathy.  No indication for imaging, since she did not have any trauma.  Very short-term duration of illness, not requiring further ED evaluation or hospitalization at this time.  Patient is also concerned about a lesion on her back which appears to be nonspecific and benign.  She is already been referred to dermatology to see about it.  CRITICAL CARE-no Performed  by: Daleen Bo  Nursing Notes Reviewed/ Care Coordinated Applicable Imaging Reviewed Interpretation of Laboratory Data incorporated into ED treatment  The patient appears reasonably screened and/or stabilized for discharge and I doubt any other medical condition or other [facility] requiring further screening, evaluation, or treatment in the ED at this time prior to discharge.  Plan: Home Medications-usual medicines and Tylenol for pain; Home Treatments-rest, heat to affected area of back; return here if the recommended treatment, does not improve the symptoms; Recommended follow up-PCP follow-up PRN.  Follow-up with dermatology as planned for skin lesion.    Final Clinical Impressions(s) / ED Diagnoses   Final diagnoses:  Bilateral low back pain without sciatica, unspecified chronicity    ED Discharge Orders    None       Daleen Bo, MD 04/29/18 [phone]

## 2018-04-29 NOTE — ED Notes (Signed)
ED Provider at bedside. 

## 2018-04-29 NOTE — ED Notes (Signed)
Unable to contact granddaughter for discharge home. Pt given cab voucher and called [employer] in order for pt to get home in time for bus to pick her up for dialysis at 0930. Pt taken out front in wheel chair- nurse first informed.

## 2018-04-29 NOTE — Discharge Instructions (Addendum)
For the discomfort in your back, use heat on the sore area 3 or 4 times a day.  For pain, you can take Tylenol 650 mg every 4 hours.  Your doctor has referred you to a dermatologist, to evaluate the bump on your mid back..  Their note did not list who it is.  Please call the number at [phone], for instructions on how to see this doctor.  Make sure that you go to your dialysis appointment this morning.

## 2018-04-29 NOTE — Assessment & Plan Note (Signed)
Hyperkeratotic lesion on back: See image media file.  Located left of the spine, in the lower thoracic region. Nonspecific structure with a hyperpigmented base extending 1.5 cm circumferentially from the center of the lesion which is poorly defined has a pedunculated hyperkeratotic structure surrounded by a hyperkeratotic raised papule (lichenified lesion?).  Plan:  I believe that the patient would most benefit from a dermatology referral for evaluation for diagnosis and removal.

## 2018-04-29 NOTE — ED Triage Notes (Signed)
Pt arrives from home for lower mid back pain that started yesterday. Pt is a dialysis pt- due today-last treatment Monday. Pt states she does not produce urine.

## 2018-04-29 NOTE — Progress Notes (Signed)
[facility] Medicine Clinic Attending  Case discussed with Dr. Harbrecht at the time of the visit.  We reviewed the resident's history and exam and pertinent patient test results.  I agree with the assessment, diagnosis, and plan of care documented in the resident's note.   

## 2018-04-30 ENCOUNTER — Emergency Department (HOSPITAL_COMMUNITY)
Admission: EM | Admit: 2018-04-30 | Discharge: 2018-04-30 | Disposition: A | Discharge status: Home / Self Care | Payer: Medicare Other | Attending: Emergency Medicine | Admitting: Emergency Medicine

## 2018-04-30 ENCOUNTER — Encounter (HOSPITAL_COMMUNITY): Payer: Self-pay | Admitting: Emergency Medicine

## 2018-04-30 DIAGNOSIS — K219 Gastro-esophageal reflux disease without esophagitis: Secondary | ICD-10-CM | POA: Diagnosis not present

## 2018-04-30 DIAGNOSIS — Z8673 Personal history of transient ischemic attack (TIA), and cerebral infarction without residual deficits: Secondary | ICD-10-CM | POA: Diagnosis not present

## 2018-04-30 DIAGNOSIS — R451 Restlessness and agitation: Secondary | ICD-10-CM | POA: Diagnosis not present

## 2018-04-30 DIAGNOSIS — Z79899 Other long term (current) drug therapy: Secondary | ICD-10-CM | POA: Diagnosis not present

## 2018-04-30 DIAGNOSIS — I10 Essential (primary) hypertension: Secondary | ICD-10-CM | POA: Diagnosis not present

## 2018-04-30 DIAGNOSIS — R262 Difficulty in walking, not elsewhere classified: Secondary | ICD-10-CM | POA: Diagnosis not present

## 2018-04-30 DIAGNOSIS — M545 Low back pain, unspecified: Secondary | ICD-10-CM

## 2018-04-30 DIAGNOSIS — Z743 Need for continuous supervision: Secondary | ICD-10-CM | POA: Diagnosis not present

## 2018-04-30 DIAGNOSIS — N2581 Secondary hyperparathyroidism of renal origin: Secondary | ICD-10-CM | POA: Diagnosis not present

## 2018-04-30 DIAGNOSIS — I6381 Other cerebral infarction due to occlusion or stenosis of small artery: Secondary | ICD-10-CM | POA: Diagnosis not present

## 2018-04-30 DIAGNOSIS — R918 Other nonspecific abnormal finding of lung field: Secondary | ICD-10-CM | POA: Diagnosis not present

## 2018-04-30 DIAGNOSIS — Z7982 Long term (current) use of aspirin: Secondary | ICD-10-CM | POA: Diagnosis not present

## 2018-04-30 DIAGNOSIS — I132 Hypertensive heart and chronic kidney disease with heart failure and with stage 5 chronic kidney disease, or end stage renal disease: Secondary | ICD-10-CM | POA: Diagnosis not present

## 2018-04-30 DIAGNOSIS — M5489 Other dorsalgia: Secondary | ICD-10-CM | POA: Diagnosis not present

## 2018-04-30 DIAGNOSIS — M62830 Muscle spasm of back: Secondary | ICD-10-CM | POA: Diagnosis not present

## 2018-04-30 DIAGNOSIS — I5032 Chronic diastolic (congestive) heart failure: Secondary | ICD-10-CM | POA: Diagnosis not present

## 2018-04-30 DIAGNOSIS — Z9119 Patient's noncompliance with other medical treatment and regimen: Secondary | ICD-10-CM | POA: Diagnosis not present

## 2018-04-30 DIAGNOSIS — M62838 Other muscle spasm: Secondary | ICD-10-CM

## 2018-04-30 DIAGNOSIS — N186 End stage renal disease: Secondary | ICD-10-CM | POA: Diagnosis not present

## 2018-04-30 DIAGNOSIS — I120 Hypertensive chronic kidney disease with stage 5 chronic kidney disease or end stage renal disease: Secondary | ICD-10-CM | POA: Diagnosis not present

## 2018-04-30 DIAGNOSIS — R269 Unspecified abnormalities of gait and mobility: Secondary | ICD-10-CM | POA: Diagnosis not present

## 2018-04-30 DIAGNOSIS — M5441 Lumbago with sciatica, right side: Secondary | ICD-10-CM | POA: Diagnosis not present

## 2018-04-30 DIAGNOSIS — Z992 Dependence on renal dialysis: Secondary | ICD-10-CM | POA: Insufficient documentation

## 2018-04-30 DIAGNOSIS — R410 Disorientation, unspecified: Secondary | ICD-10-CM | POA: Diagnosis not present

## 2018-04-30 DIAGNOSIS — I509 Heart failure, unspecified: Secondary | ICD-10-CM | POA: Diagnosis not present

## 2018-04-30 DIAGNOSIS — Z9115 Patient's noncompliance with renal dialysis: Secondary | ICD-10-CM | POA: Diagnosis not present

## 2018-04-30 DIAGNOSIS — D631 Anemia in chronic kidney disease: Secondary | ICD-10-CM | POA: Diagnosis not present

## 2018-04-30 DIAGNOSIS — D72829 Elevated white blood cell count, unspecified: Secondary | ICD-10-CM | POA: Diagnosis not present

## 2018-04-30 DIAGNOSIS — E871 Hypo-osmolality and hyponatremia: Secondary | ICD-10-CM | POA: Diagnosis not present

## 2018-04-30 DIAGNOSIS — E8770 Fluid overload, unspecified: Secondary | ICD-10-CM | POA: Diagnosis not present

## 2018-04-30 DIAGNOSIS — G9340 Encephalopathy, unspecified: Secondary | ICD-10-CM | POA: Diagnosis not present

## 2018-04-30 LAB — CBC WITH DIFFERENTIAL/PLATELET
ABS IMMATURE GRANULOCYTES: 0.04 10*3/uL (ref 0.00–0.07)
Basophils Absolute: 0 10*3/uL (ref 0.0–0.1)
Basophils Relative: 0 %
EOS ABS: 0 10*3/uL (ref 0.0–0.5)
Eosinophils Relative: 0 %
HEMATOCRIT: 41.4 % (ref 36.0–46.0)
Hemoglobin: 12 g/dL (ref 12.0–15.0)
IMMATURE GRANULOCYTES: 1 %
LYMPHS ABS: 0.5 10*3/uL — AB (ref 0.7–4.0)
Lymphocytes Relative: 5 %
MCH: 27.6 pg (ref 26.0–34.0)
MCHC: 29 g/dL — ABNORMAL LOW (ref 30.0–36.0)
MCV: 95.4 fL (ref 80.0–100.0)
Monocytes Absolute: 0.7 10*3/uL (ref 0.1–1.0)
Monocytes Relative: 8 %
NEUTROS ABS: 7.3 10*3/uL (ref 1.7–7.7)
NEUTROS PCT: 86 %
NRBC: 0 % (ref 0.0–0.2)
PLATELETS: 149 10*3/uL — AB (ref 150–400)
RBC: 4.34 MIL/uL (ref 3.87–5.11)
RDW: 16.3 % — AB (ref 11.5–15.5)
WBC: 8.5 10*3/uL (ref 4.0–10.5)

## 2018-04-30 LAB — BASIC METABOLIC PANEL
ANION GAP: 16 — AB (ref 5–15)
BUN: 38 mg/dL — ABNORMAL HIGH (ref 8–23)
CALCIUM: 9.4 mg/dL (ref 8.9–10.3)
CO2: 24 mmol/L (ref 22–32)
Chloride: 94 mmol/L — ABNORMAL LOW (ref 98–111)
Creatinine, Ser: 8.57 mg/dL — ABNORMAL HIGH (ref 0.44–1.00)
GFR, EST AFRICAN AMERICAN: 5 mL/min — AB (ref >60)
GFR, EST NON AFRICAN AMERICAN: 4 mL/min — AB (ref >60)
Glucose, Bld: 93 mg/dL (ref 70–99)
Potassium: 4.2 mmol/L (ref 3.5–5.1)
Sodium: 134 mmol/L — ABNORMAL LOW (ref 135–145)

## 2018-04-30 MED ORDER — MORPHINE SULFATE (PF) 4 MG/ML IV SOLN
4.0000 mg | Freq: Once | INTRAVENOUS | Status: AC
  Administered 2018-04-30: 4 mg via INTRAVENOUS
  Filled 2018-04-30: qty 1

## 2018-04-30 MED ORDER — LIDOCAINE 5 % EX PTCH
1.0000 | MEDICATED_PATCH | CUTANEOUS | Status: DC
  Administered 2018-04-30: 1 via TRANSDERMAL
  Filled 2018-04-30: qty 1

## 2018-04-30 MED ORDER — OXYCODONE-ACETAMINOPHEN 5-325 MG PO TABS: 0.5000 | ORAL_TABLET | ORAL | 0 refills | Status: DC | PRN

## 2018-04-30 MED ORDER — ONDANSETRON HCL 4 MG/2ML IJ SOLN
4.0000 mg | Freq: Once | INTRAMUSCULAR | Status: AC
  Administered 2018-04-30: 4 mg via INTRAVENOUS
  Filled 2018-04-30: qty 2

## 2018-04-30 NOTE — ED Triage Notes (Signed)
Patient reports chronic low back pain for several years , denies recent injury or fall , pain worsened this week , seen here yesterday for the same complaint and was discharged home . Hemodialysis 2 Mon/Wed/Fri ,she missed her dialysis Wednesday .

## 2018-04-30 NOTE — ED Provider Notes (Signed)
[place] EMERGENCY DEPARTMENT Provider Note   CSN: [id number] Arrival date & time: 04/30/18  0216     History   Chief Complaint Chief Complaint  Patient presents with  . Back Pain    HPI Nancy [name] is a 77 y.o. female.  Patient presents to the emergency department for evaluation of back pain.  She reports chronic back pain, but has had increased pain for the last week.  She was seen in the ER yesterday but has not had any relief.  Patient reports pain on the right side of her back in the lumbar area that worsens when she moves.  No radiation to the leg, no numbness, tingling or weakness.     Past Medical History:  Diagnosis Date  . Anemia   . Aortic stenosis   . Bacterial sinusitis 09/17/2011  . CHF (congestive heart failure) ([place])   . CKD (chronic kidney disease) stage 4, GFR 15-29 ml/min ([place]) 08/11/2006   Cr continues to increase. Proteinuria on UA 02/10/12.    . Colitis   . CVA (cerebrovascular accident) [facility])    New hemorrhagic per CT scan '09  . Diverticulosis of colon   . Dysfunctional uterine bleeding   . ESRD (end stage renal disease) on dialysis ([place])    "MWF; [facility]" (11/27/2017)  . Fecal impaction ([place])   . Headache(784.0)   . Heart murmur   . HERNIORRHAPHY, HX OF 08/11/2006  . Hypertension   . OA (osteoarthritis)    bilateral knees  . Postmenopausal   . Pulmonary nodule   . TINEA CRURIS 01/12/2007    Patient Active Problem List   Diagnosis Date Noted  . Chronic cough 04/17/2018  . Acute gastritis without hemorrhage   . Duodenitis with bleeding   . ESRD (end stage renal disease) ([place])   . Papular lichenification 84/13/2440  . Acute on chronic blood loss anemia 03/06/2018  . Protein-calorie malnutrition, severe 01/12/2018  . Midline thoracic back pain 12/23/2017  . Symptomatic anemia 02/17/2017  . Secondary hyperparathyroidism of renal origin ([place]) 01/15/2017  . Mitral valve annular calcification   . Dizziness 12/08/2016   . Angina at rest [facility])   . Goals of care, counseling/discussion   . Aortic stenosis 05/21/2016  . Anemia associated with chronic renal failure 02/01/2016  . Atherosclerosis of aorta ([place]) 01/11/2015  . End stage renal disease ([place]) 02/04/2013  . Non-intractable vomiting with nausea 08/17/2012  . Glaucoma 03/18/2012  . Health care maintenance 09/17/2011  . Osteoarthrosis involving lower leg 10/31/2008  . History of CVA (cerebrovascular accident) 01/28/2008  . Hyperlipidemia 02/13/2007  . PULMONARY NODULES 01/12/2007  . Left ventricular hypertrophy 09/02/2006  . Essential hypertension 08/11/2006  . GERD 08/11/2006    Past Surgical History:  Procedure Laterality Date  . ABDOMINAL HYSTERECTOMY    . AV FISTULA PLACEMENT Left 02/19/2017   Procedure: CREATION OF LEFT ARM BRACHIOCEPHALIC ARTERIOVENOUS (AV) FISTULA;  Surgeon: Rosetta Posner, MD;  Location: [place];  Service: Vascular;  Laterality: Left;  . BASCILIC VEIN TRANSPOSITION Left 04/23/2017   Procedure: BASILIC VEIN TRANSPOSITION SECOND STAGE;  Surgeon: Rosetta Posner, MD;  Location: [place];  Service: Vascular;  Laterality: Left;  . BIOPSY  02/20/2018   Procedure: BIOPSY;  Surgeon: Carol Ada, MD;  Location: [facility] END[facility]COPY;  Service: Endoscopy;;  . BIOPSY  03/07/2018   Procedure: BIOPSY;  Surgeon: Carol Ada, MD;  Location: [facility] ENDOSCOPY;  Service: Endoscopy;;  . CHOLECYSTECTOMY  2009  . COLONOSCOPY    .  ESOPHAGOGASTRODUODENOSCOPY N/A 03/07/2018   Procedure: ESOPHAGOGASTRODUODENOSCOPY (EGD);  Surgeon: Carol Ada, MD;  Location: [place];  Service: Endoscopy;  Laterality: N/A;  . ESOPHAGOGASTRODUODENOSCOPY (EGD) WITH PROPOFOL N/A 02/20/2018   Procedure: ESOPHAGOGASTRODUODENOSCOPY (EGD) WITH PROPOFOL;  Surgeon: Carol Ada, MD;  Location: [facility] ENDOSCOPY;  Service: Endoscopy;  Laterality: N/A;  . INGUINAL HERNIA REPAIR  2008  . INSERTION OF DIALYSIS CATHETER Right 02/19/2017   Procedure: INSERTION OF TUNNELED DIALYSIS CATHETER -  RIGHT INTERNAL JUGULAR PLACEMENT;  Surgeon: Rosetta Posner, MD;  Location: [place];  Service: Vascular;  Laterality: Right;  . IRIDOTOMY / IRIDECTOMY     Laser, right eye 12/26/11 left eye 01/24/12  . MASS EXCISION Left 05/07/2013   Procedure: EXCISION CYST;  Surgeon: Myrtha Mantis., MD;  Location: [place];  Service: Ophthalmology;  Laterality: Left;  . SAVORY DILATION N/A 02/20/2018   Procedure: SAVORY DILATION;  Surgeon: Carol Ada, MD;  Location: [facility] ENDOSCOPY;  Service: Endoscopy;  Laterality: N/A;     OB History   None      Home Medications    Prior to Admission medications   Medication Sig Start Date End Date Taking? Authorizing Provider  amoxicillin-clavulanate (AUGMENTIN) 500-125 MG tablet Take 1 tablet (500 mg total) by mouth daily. 04/21/18   Masoudi, Dorthula Rue, MD  aspirin EC 81 MG EC tablet Take 1 tablet (81 mg total) by mouth daily. Patient not taking: Reported on 04/07/2018 12/10/16   Kalman Shan Ratliff, DO  azithromycin (ZITHROMAX) 500 MG tablet Take 1 tablet (500 mg total) by mouth daily. 04/20/18   Masoudi, Dorthula Rue, MD  calcitRIOL (ROCALTROL) 0.5 MCG capsule Take 4 capsules (2 mcg total) by mouth every Monday, Wednesday, and Friday with hemodialysis. Patient not taking: Reported on 04/18/2018 12/30/17   Neva Seat, MD  diclofenac sodium (VOLTAREN) 1 % GEL Apply 4 g topically 4 (four) times daily. Patient not taking: Reported on 04/18/2018 01/20/18   Mosetta Anis, MD  diphenhydrAMINE-zinc acetate (BENADRYL) cream Apply topically 3 (three) times daily as needed for itching. 04/20/18   Masoudi, Dorthula Rue, MD  guaiFENesin (ROBITUSSIN) 100 MG/5ML SOLN Take 5 mLs (100 mg total) by mouth every 4 (four) hours as needed for cough or to loosen phlegm. 04/20/18   Masoudi, Elhamalsadat, MD  hydrocortisone cream 0.5 % Apply 1 application topically 2 (two) times daily. Patient not taking: Reported on 04/18/2018 03/05/18   Lars Mage, MD    latanoprost (XALATAN) 0.005 % ophthalmic solution Place 1 drop into both eyes at bedtime. 04/11/18   [provider]  mirtazapine (REMERON) 15 MG tablet Take 1 tablet (15 mg total) by mouth at bedtime. Patient not taking: Reported on 04/18/2018 02/01/18   [name], MD  nitroGLYCERIN (NITROSTAT) 0.3 MG SL tablet Place 1 tablet (0.3 mg total) under the tongue every 5 (five) minutes as needed for chest pain. Patient not taking: Reported on 04/18/2018 02/27/17   Sid Falcon, MD  oxyCODONE-acetaminophen (PERCOCET) 5-325 MG tablet Take 0.5-1 tablets by mouth every 4 (four) hours as needed. 04/30/18   Orpah Greek, MD  pantoprazole (PROTONIX) 40 MG tablet Take 1 tablet (40 mg total) by mouth daily. Patient taking differently: Take 40 mg by mouth daily as needed (acid reflux).  01/14/18 04/17/18  [name], MD  promethazine (PHENERGAN) 12.5 MG tablet Take 1 tablet (12.5 mg total) by mouth every 6 (six) hours as needed for nausea or vomiting (Take 1 prior to dialysis). Take 1 tablet half an hour before meal. Patient  not taking: Reported on 04/18/2018 02/01/18   [name], MD    Family History Family History  Problem Relation Age of Onset  . Hypertension Mother   . Congestive Heart Failure Mother   . Heart attack Brother 50    Social History Social History   Tobacco Use  . Smoking status: Never Smoker  . Smokeless tobacco: Never Used  Substance Use Topics  . Alcohol use: No    Alcohol/week: 0.0 standard drinks  . Drug use: No    Comment: 08/15/08 UDS + cocaine     Allergies   Hydrocodone and Tape   Review of Systems Review of Systems  Musculoskeletal: Positive for back pain.  All other systems reviewed and are negative.    Physical Exam Updated Vital Signs BP 139/72   Pulse 95   Temp 98.2 F (36.8 C) (Oral)   Resp 16   SpO2 95%   Physical Exam  Constitutional: She is oriented to person, place, and time. She appears well-developed and  well-nourished. No distress.  HENT:  Head: Normocephalic and atraumatic.  Right Ear: Hearing normal.  Left Ear: Hearing normal.  Nose: Nose normal.  Mouth/Throat: Oropharynx is clear and moist and mucous membranes are normal.  Eyes: Pupils are equal, round, and reactive to light. Conjunctivae and EOM are normal.  Neck: Normal range of motion. Neck supple.  Cardiovascular: Regular rhythm, S1 normal and S2 normal. Exam reveals no gallop and no friction rub.  No murmur heard. Pulmonary/Chest: Effort normal and breath sounds normal. No respiratory distress. She exhibits no tenderness.  Abdominal: Soft. Normal appearance and bowel sounds are normal. There is no hepatosplenomegaly. There is no tenderness. There is no rebound, no guarding, no tenderness at McBurney's point and negative Murphy's sign. No hernia.  Musculoskeletal: Normal range of motion.       Lumbar back: She exhibits tenderness.       Back:  Neurological: She is alert and oriented to person, place, and time. She has normal strength. No cranial nerve deficit or sensory deficit. Coordination normal. GCS eye subscore is 4. GCS verbal subscore is 5. GCS motor subscore is 6.  Skin: Skin is warm, dry and intact. No rash noted. No cyanosis.  Psychiatric: She has a normal mood and affect. Her speech is normal and behavior is normal. Thought content normal.  Nursing note and vitals reviewed.    ED Treatments / Results  Labs (all labs ordered are listed, but only abnormal results are displayed) Labs Reviewed  CBC WITH DIFFERENTIAL/PLATELET - Abnormal; Notable for the following components:      Result Value   MCHC 29.0 (*)    RDW 16.3 (*)    Platelets 149 (*)    Lymphs Abs 0.5 (*)    All other components within normal limits  BASIC METABOLIC PANEL - Abnormal; Notable for the following components:   Sodium 134 (*)    Chloride 94 (*)    BUN 38 (*)    Creatinine, Ser 8.57 (*)    GFR calc non Af Amer 4 (*)    GFR calc Af Amer 5  (*)    Anion gap 16 (*)    All other components within normal limits    EKG None  Radiology No results found.  Procedures Procedures (including critical care time)  Medications Ordered in ED Medications  morphine 4 MG/ML injection 4 mg (4 mg Intravenous Given 04/30/18 0543)  ondansetron (ZOFRAN) injection 4 mg (4 mg Intravenous Given 04/30/18 0542)  Initial Impression / Assessment and Plan / ED Course  I have reviewed the triage vital signs and the nursing notes.  Pertinent labs & imaging results that were available during my care of the patient were reviewed by me and considered in my medical decision making (see chart for details).     Patient presents to the emergency department for evaluation of back pain.  She has a history of chronic back pain, but reports worsening pain.  She does not have any prescription pain medications to use at home.  She was seen yesterday and evaluated, felt to be experiencing musculoskeletal pain.  She has not followed up with her primary doctor.  Patient is comfortable lying flat in the bed.  She does not have any pain without movement, but when she moves she has severe sharp pains in the right lower back.  She has significant spasms of the paraspinal muscles in this area when she moves.  This is consistent with a musculoskeletal pain.  Patient administered a dose of analgesia and has had complete resolution.  Will discharge, short course of Percocet as needed, follow-up with primary doctor  Final Clinical Impressions(s) / ED Diagnoses   Final diagnoses:  Acute right-sided low back pain without sciatica  Muscle spasm    ED Discharge Orders         Ordered    oxyCODONE-acetaminophen (PERCOCET) 5-325 MG tablet  Every 4 hours PRN     04/30/18 0652           Orpah Greek, MD 04/30/18 [phone]

## 2018-04-30 NOTE — Telephone Encounter (Signed)
Patient completed HFU 04/28/2018. Hubbard Hartshorn, RN, BSN

## 2018-05-01 DIAGNOSIS — R451 Restlessness and agitation: Secondary | ICD-10-CM | POA: Diagnosis not present

## 2018-05-01 DIAGNOSIS — M549 Dorsalgia, unspecified: Secondary | ICD-10-CM | POA: Diagnosis not present

## 2018-05-01 DIAGNOSIS — R918 Other nonspecific abnormal finding of lung field: Secondary | ICD-10-CM | POA: Diagnosis not present

## 2018-05-01 DIAGNOSIS — R9431 Abnormal electrocardiogram [ECG] [EKG]: Secondary | ICD-10-CM | POA: Diagnosis not present

## 2018-05-01 DIAGNOSIS — Z992 Dependence on renal dialysis: Secondary | ICD-10-CM | POA: Diagnosis not present

## 2018-05-01 DIAGNOSIS — N186 End stage renal disease: Secondary | ICD-10-CM | POA: Diagnosis not present

## 2018-05-01 DIAGNOSIS — I132 Hypertensive heart and chronic kidney disease with heart failure and with stage 5 chronic kidney disease, or end stage renal disease: Secondary | ICD-10-CM | POA: Diagnosis not present

## 2018-05-01 DIAGNOSIS — R410 Disorientation, unspecified: Secondary | ICD-10-CM | POA: Diagnosis not present

## 2018-05-01 DIAGNOSIS — I120 Hypertensive chronic kidney disease with stage 5 chronic kidney disease or end stage renal disease: Secondary | ICD-10-CM | POA: Diagnosis not present

## 2018-05-01 DIAGNOSIS — I440 Atrioventricular block, first degree: Secondary | ICD-10-CM | POA: Diagnosis not present

## 2018-05-01 DIAGNOSIS — E871 Hypo-osmolality and hyponatremia: Secondary | ICD-10-CM | POA: Diagnosis not present

## 2018-05-01 DIAGNOSIS — I5030 Unspecified diastolic (congestive) heart failure: Secondary | ICD-10-CM | POA: Diagnosis not present

## 2018-05-02 DIAGNOSIS — I5030 Unspecified diastolic (congestive) heart failure: Secondary | ICD-10-CM | POA: Diagnosis not present

## 2018-05-02 DIAGNOSIS — M549 Dorsalgia, unspecified: Secondary | ICD-10-CM | POA: Diagnosis not present

## 2018-05-02 DIAGNOSIS — I120 Hypertensive chronic kidney disease with stage 5 chronic kidney disease or end stage renal disease: Secondary | ICD-10-CM | POA: Diagnosis not present

## 2018-05-02 DIAGNOSIS — R451 Restlessness and agitation: Secondary | ICD-10-CM | POA: Diagnosis not present

## 2018-05-02 DIAGNOSIS — N186 End stage renal disease: Secondary | ICD-10-CM | POA: Diagnosis not present

## 2018-05-02 DIAGNOSIS — E871 Hypo-osmolality and hyponatremia: Secondary | ICD-10-CM | POA: Diagnosis not present

## 2018-05-02 DIAGNOSIS — M545 Low back pain: Secondary | ICD-10-CM | POA: Diagnosis not present

## 2018-05-02 DIAGNOSIS — Z992 Dependence on renal dialysis: Secondary | ICD-10-CM | POA: Diagnosis not present

## 2018-05-02 DIAGNOSIS — I132 Hypertensive heart and chronic kidney disease with heart failure and with stage 5 chronic kidney disease, or end stage renal disease: Secondary | ICD-10-CM | POA: Diagnosis not present

## 2018-05-03 DIAGNOSIS — Z992 Dependence on renal dialysis: Secondary | ICD-10-CM | POA: Diagnosis not present

## 2018-05-03 DIAGNOSIS — E871 Hypo-osmolality and hyponatremia: Secondary | ICD-10-CM | POA: Diagnosis not present

## 2018-05-03 DIAGNOSIS — M549 Dorsalgia, unspecified: Secondary | ICD-10-CM | POA: Diagnosis not present

## 2018-05-03 DIAGNOSIS — I5030 Unspecified diastolic (congestive) heart failure: Secondary | ICD-10-CM | POA: Diagnosis not present

## 2018-05-03 DIAGNOSIS — I132 Hypertensive heart and chronic kidney disease with heart failure and with stage 5 chronic kidney disease, or end stage renal disease: Secondary | ICD-10-CM | POA: Diagnosis not present

## 2018-05-03 DIAGNOSIS — N186 End stage renal disease: Secondary | ICD-10-CM | POA: Diagnosis not present

## 2018-05-04 DIAGNOSIS — I5030 Unspecified diastolic (congestive) heart failure: Secondary | ICD-10-CM | POA: Diagnosis not present

## 2018-05-04 DIAGNOSIS — I120 Hypertensive chronic kidney disease with stage 5 chronic kidney disease or end stage renal disease: Secondary | ICD-10-CM | POA: Diagnosis not present

## 2018-05-04 DIAGNOSIS — Z992 Dependence on renal dialysis: Secondary | ICD-10-CM | POA: Diagnosis not present

## 2018-05-04 DIAGNOSIS — E871 Hypo-osmolality and hyponatremia: Secondary | ICD-10-CM | POA: Diagnosis not present

## 2018-05-04 DIAGNOSIS — I132 Hypertensive heart and chronic kidney disease with heart failure and with stage 5 chronic kidney disease, or end stage renal disease: Secondary | ICD-10-CM | POA: Diagnosis not present

## 2018-05-04 DIAGNOSIS — N186 End stage renal disease: Secondary | ICD-10-CM | POA: Diagnosis not present

## 2018-05-04 DIAGNOSIS — M549 Dorsalgia, unspecified: Secondary | ICD-10-CM | POA: Diagnosis not present

## 2018-05-04 DIAGNOSIS — R451 Restlessness and agitation: Secondary | ICD-10-CM | POA: Diagnosis not present

## 2018-05-04 DIAGNOSIS — Z9115 Patient's noncompliance with renal dialysis: Secondary | ICD-10-CM | POA: Diagnosis not present

## 2018-05-05 ENCOUNTER — Telehealth: Payer: Self-pay | Admitting: Internal Medicine

## 2018-05-05 DIAGNOSIS — I132 Hypertensive heart and chronic kidney disease with heart failure and with stage 5 chronic kidney disease, or end stage renal disease: Secondary | ICD-10-CM | POA: Diagnosis not present

## 2018-05-05 DIAGNOSIS — Z992 Dependence on renal dialysis: Secondary | ICD-10-CM | POA: Diagnosis not present

## 2018-05-05 DIAGNOSIS — I6381 Other cerebral infarction due to occlusion or stenosis of small artery: Secondary | ICD-10-CM | POA: Diagnosis not present

## 2018-05-05 DIAGNOSIS — E871 Hypo-osmolality and hyponatremia: Secondary | ICD-10-CM | POA: Diagnosis not present

## 2018-05-05 DIAGNOSIS — M5441 Lumbago with sciatica, right side: Secondary | ICD-10-CM | POA: Diagnosis not present

## 2018-05-05 DIAGNOSIS — N186 End stage renal disease: Secondary | ICD-10-CM | POA: Diagnosis not present

## 2018-05-05 DIAGNOSIS — I5030 Unspecified diastolic (congestive) heart failure: Secondary | ICD-10-CM | POA: Diagnosis not present

## 2018-05-05 NOTE — Telephone Encounter (Signed)
Hospital f/u [facility] per [facility]; pt appt 11/05 1015am

## 2018-05-06 ENCOUNTER — Emergency Department (HOSPITAL_COMMUNITY): Payer: Medicare Other

## 2018-05-06 ENCOUNTER — Other Ambulatory Visit: Payer: Self-pay

## 2018-05-06 ENCOUNTER — Encounter (HOSPITAL_COMMUNITY): Payer: Self-pay | Admitting: Emergency Medicine

## 2018-05-06 ENCOUNTER — Telehealth: Payer: Self-pay | Admitting: *Deleted

## 2018-05-06 ENCOUNTER — Emergency Department (HOSPITAL_COMMUNITY)
Admission: EM | Admit: 2018-05-06 | Discharge: 2018-05-06 | Disposition: A | Discharge status: Home / Self Care | Payer: Medicare Other | Attending: Emergency Medicine | Admitting: Emergency Medicine

## 2018-05-06 DIAGNOSIS — Y93E1 Activity, personal bathing and showering: Secondary | ICD-10-CM | POA: Insufficient documentation

## 2018-05-06 DIAGNOSIS — M5441 Lumbago with sciatica, right side: Secondary | ICD-10-CM | POA: Diagnosis not present

## 2018-05-06 DIAGNOSIS — N186 End stage renal disease: Secondary | ICD-10-CM | POA: Diagnosis not present

## 2018-05-06 DIAGNOSIS — W182XXA Fall in (into) shower or empty bathtub, initial encounter: Secondary | ICD-10-CM | POA: Insufficient documentation

## 2018-05-06 DIAGNOSIS — M25561 Pain in right knee: Secondary | ICD-10-CM | POA: Diagnosis not present

## 2018-05-06 DIAGNOSIS — I5030 Unspecified diastolic (congestive) heart failure: Secondary | ICD-10-CM | POA: Diagnosis not present

## 2018-05-06 DIAGNOSIS — M25551 Pain in right hip: Secondary | ICD-10-CM | POA: Insufficient documentation

## 2018-05-06 DIAGNOSIS — G8929 Other chronic pain: Secondary | ICD-10-CM

## 2018-05-06 DIAGNOSIS — Z992 Dependence on renal dialysis: Secondary | ICD-10-CM | POA: Insufficient documentation

## 2018-05-06 DIAGNOSIS — W19XXXA Unspecified fall, initial encounter: Secondary | ICD-10-CM | POA: Diagnosis not present

## 2018-05-06 DIAGNOSIS — M170 Bilateral primary osteoarthritis of knee: Secondary | ICD-10-CM | POA: Diagnosis not present

## 2018-05-06 DIAGNOSIS — R079 Chest pain, unspecified: Secondary | ICD-10-CM | POA: Diagnosis not present

## 2018-05-06 DIAGNOSIS — R52 Pain, unspecified: Secondary | ICD-10-CM

## 2018-05-06 DIAGNOSIS — S0990XA Unspecified injury of head, initial encounter: Secondary | ICD-10-CM | POA: Diagnosis not present

## 2018-05-06 DIAGNOSIS — Z9115 Patient's noncompliance with renal dialysis: Secondary | ICD-10-CM | POA: Diagnosis not present

## 2018-05-06 DIAGNOSIS — Y92012 Bathroom of single-family (private) house as the place of occurrence of the external cause: Secondary | ICD-10-CM | POA: Insufficient documentation

## 2018-05-06 DIAGNOSIS — Z743 Need for continuous supervision: Secondary | ICD-10-CM | POA: Diagnosis not present

## 2018-05-06 DIAGNOSIS — Y999 Unspecified external cause status: Secondary | ICD-10-CM | POA: Diagnosis not present

## 2018-05-06 DIAGNOSIS — Z79899 Other long term (current) drug therapy: Secondary | ICD-10-CM | POA: Insufficient documentation

## 2018-05-06 DIAGNOSIS — I509 Heart failure, unspecified: Secondary | ICD-10-CM | POA: Insufficient documentation

## 2018-05-06 DIAGNOSIS — M546 Pain in thoracic spine: Principal | ICD-10-CM

## 2018-05-06 DIAGNOSIS — S199XXA Unspecified injury of neck, initial encounter: Secondary | ICD-10-CM | POA: Diagnosis not present

## 2018-05-06 DIAGNOSIS — I132 Hypertensive heart and chronic kidney disease with heart failure and with stage 5 chronic kidney disease, or end stage renal disease: Secondary | ICD-10-CM | POA: Insufficient documentation

## 2018-05-06 LAB — CBC WITH DIFFERENTIAL/PLATELET
ABS IMMATURE GRANULOCYTES: 0.22 10*3/uL — AB (ref 0.00–0.07)
BASOS ABS: 0 10*3/uL (ref 0.0–0.1)
BASOS PCT: 0 %
EOS ABS: 0 10*3/uL (ref 0.0–0.5)
Eosinophils Relative: 0 %
HCT: 34.7 % — ABNORMAL LOW (ref 36.0–46.0)
Hemoglobin: 10.5 g/dL — ABNORMAL LOW (ref 12.0–15.0)
IMMATURE GRANULOCYTES: 2 %
Lymphocytes Relative: 9 %
Lymphs Abs: 1 10*3/uL (ref 0.7–4.0)
MCH: 28.2 pg (ref 26.0–34.0)
MCHC: 30.3 g/dL (ref 30.0–36.0)
MCV: 93 fL (ref 80.0–100.0)
Monocytes Absolute: 0.8 10*3/uL (ref 0.1–1.0)
Monocytes Relative: 7 %
NEUTROS ABS: 8.6 10*3/uL — AB (ref 1.7–7.7)
NEUTROS PCT: 82 %
NRBC: 0 % (ref 0.0–0.2)
Platelets: 245 10*3/uL (ref 150–400)
RBC: 3.73 MIL/uL — ABNORMAL LOW (ref 3.87–5.11)
RDW: 17.2 % — AB (ref 11.5–15.5)
WBC: 10.6 10*3/uL — ABNORMAL HIGH (ref 4.0–10.5)

## 2018-05-06 LAB — BASIC METABOLIC PANEL
ANION GAP: 11 (ref 5–15)
BUN: 37 mg/dL — ABNORMAL HIGH (ref 8–23)
CALCIUM: 9.9 mg/dL (ref 8.9–10.3)
CO2: 27 mmol/L (ref 22–32)
Chloride: 98 mmol/L (ref 98–111)
Creatinine, Ser: 7.07 mg/dL — ABNORMAL HIGH (ref 0.44–1.00)
GFR calc non Af Amer: 5 mL/min — ABNORMAL LOW (ref >60)
GFR, EST AFRICAN AMERICAN: 6 mL/min — AB (ref >60)
GLUCOSE: 86 mg/dL (ref 70–99)
POTASSIUM: 4.1 mmol/L (ref 3.5–5.1)
SODIUM: 136 mmol/L (ref 135–145)

## 2018-05-06 MED ORDER — ASPIRIN EC 81 MG PO TBEC
81.00 | DELAYED_RELEASE_TABLET | ORAL | Status: DC
Start: 2018-05-06 — End: 2018-05-06

## 2018-05-06 MED ORDER — MIRTAZAPINE 7.5 MG PO TABS
7.50 | ORAL_TABLET | ORAL | Status: DC
Start: 2018-05-05 — End: 2018-05-06

## 2018-05-06 MED ORDER — CALCITRIOL 0.25 MCG PO CAPS
2.00 | ORAL_CAPSULE | ORAL | Status: DC
Start: 2018-05-06 — End: 2018-05-06

## 2018-05-06 MED ORDER — PANTOPRAZOLE SODIUM 40 MG PO TBEC
40.00 | DELAYED_RELEASE_TABLET | ORAL | Status: DC
Start: 2018-05-06 — End: 2018-05-06

## 2018-05-06 MED ORDER — GENERIC EXTERNAL MEDICATION
8.60 | Status: DC
Start: 2018-05-05 — End: 2018-05-06

## 2018-05-06 MED ORDER — POLYETHYLENE GLYCOL 3350 17 G PO PACK
17.00 | PACK | ORAL | Status: DC
Start: 2018-05-06 — End: 2018-05-06

## 2018-05-06 MED ORDER — NEPHRO-VITE 0.8 MG PO TABS
1.00 | ORAL_TABLET | ORAL | Status: DC
Start: 2018-05-06 — End: 2018-05-06

## 2018-05-06 MED ORDER — LIDOCAINE 4 % EX PTCH
1.00 | MEDICATED_PATCH | CUTANEOUS | Status: DC
Start: 2018-05-06 — End: 2018-05-06

## 2018-05-06 MED ORDER — ACETAMINOPHEN 500 MG PO TABS
500.00 | ORAL_TABLET | ORAL | Status: DC
Start: ? — End: 2018-05-06

## 2018-05-06 MED ORDER — HEPARIN SODIUM (PORCINE) 5000 UNIT/ML IJ SOLN
5000.00 | INTRAMUSCULAR | Status: DC
Start: 2018-05-05 — End: 2018-05-06

## 2018-05-06 MED ORDER — ALBUTEROL SULFATE (2.5 MG/3ML) 0.083% IN NEBU
2.50 | INHALATION_SOLUTION | RESPIRATORY_TRACT | Status: DC
Start: ? — End: 2018-05-06

## 2018-05-06 MED ORDER — GENERIC EXTERNAL MEDICATION
500.00 | Status: DC
Start: 2018-05-06 — End: 2018-05-06

## 2018-05-06 NOTE — ED Notes (Signed)
Patient verbalizes understanding of discharge instructions. Opportunity for questioning and answers were provided. Armband removed by staff, pt discharged from ED home via POV.  

## 2018-05-06 NOTE — ED Provider Notes (Signed)
[facility] EMERGENCY DEPARTMENT Provider Note   CSN: [id number] Arrival date & time: 05/06/18  2105     History   Chief Complaint Chief Complaint  Patient presents with  . Fall    HPI Nancy [name] is a 77 y.o. female.  The history is provided by the patient.  Fall  This is a new problem. The current episode started 3 to 5 hours ago. The problem has been resolved. Pertinent negatives include no chest pain, no abdominal pain, no headaches and no shortness of breath. Nothing aggravates the symptoms. Nothing relieves the symptoms. The treatment provided no relief.    Past Medical History:  Diagnosis Date  . Anemia   . Aortic stenosis   . Bacterial sinusitis 09/17/2011  . CHF (congestive heart failure) ([place])   . CKD (chronic kidney disease) stage 4, GFR 15-29 ml/min ([place]) 08/11/2006   Cr continues to increase. Proteinuria on UA 02/10/12.    . Colitis   . CVA (cerebrovascular accident) [facility])    New hemorrhagic per CT scan '09  . Diverticulosis of colon   . Dysfunctional uterine bleeding   . ESRD (end stage renal disease) on dialysis ([place])    "MWF; [address]" (11/27/2017)  . Fecal impaction ([place])   . Headache(784.0)   . Heart murmur   . HERNIORRHAPHY, HX OF 08/11/2006  . Hypertension   . OA (osteoarthritis)    bilateral knees  . Postmenopausal   . Pulmonary nodule   . TINEA CRURIS 01/12/2007    Patient Active Problem List   Diagnosis Date Noted  . Chronic cough 04/17/2018  . Acute gastritis without hemorrhage   . Duodenitis with bleeding   . ESRD (end stage renal disease) ([place])   . Papular lichenification 99/37/1696  . Acute on chronic blood loss anemia 03/06/2018  . Protein-calorie malnutrition, severe 01/12/2018  . Midline thoracic back pain 12/23/2017  . Symptomatic anemia 02/17/2017  . Secondary hyperparathyroidism of renal origin ([place]) 01/15/2017  . Mitral valve annular calcification   . Dizziness 12/08/2016  . Angina at rest [facility])   .  Goals of care, counseling/discussion   . Aortic stenosis 05/21/2016  . Anemia associated with chronic renal failure 02/01/2016  . Atherosclerosis of aorta ([place]) 01/11/2015  . End stage renal disease ([place]) 02/04/2013  . Non-intractable vomiting with nausea 08/17/2012  . Glaucoma 03/18/2012  . Health care maintenance 09/17/2011  . Osteoarthrosis involving lower leg 10/31/2008  . History of CVA (cerebrovascular accident) 01/28/2008  . Hyperlipidemia 02/13/2007  . PULMONARY NODULES 01/12/2007  . Left ventricular hypertrophy 09/02/2006  . Essential hypertension 08/11/2006  . GERD 08/11/2006    Past Surgical History:  Procedure Laterality Date  . ABDOMINAL HYSTERECTOMY    . AV FISTULA PLACEMENT Left 02/19/2017   Procedure: CREATION OF LEFT ARM BRACHIOCEPHALIC ARTERIOVENOUS (AV) FISTULA;  Surgeon: Rosetta Posner, MD;  Location: [place];  Service: Vascular;  Laterality: Left;  . BASCILIC VEIN TRANSPOSITION Left 04/23/2017   Procedure: BASILIC VEIN TRANSPOSITION SECOND STAGE;  Surgeon: Rosetta Posner, MD;  Location: [place];  Service: Vascular;  Laterality: Left;  . BIOPSY  02/20/2018   Procedure: BIOPSY;  Surgeon: Carol Ada, MD;  Location: [facility];  Service: Endoscopy;;  . BIOPSY  03/07/2018   Procedure: BIOPSY;  Surgeon: Carol Ada, MD;  Location: [facility] ENDOSCOPY;  Service: Endoscopy;;  . CHOLECYSTECTOMY  2009  . COLONOSCOPY    . ESOPHAGOGASTRODUODENOSCOPY N/A 03/07/2018   Procedure: ESOPHAGOGASTRODUODENOSCOPY (EGD);  Surgeon: Carol Ada, MD;  Location: [facility];  Service: Endoscopy;  Laterality: N/A;  . ESOPHAGOGASTRODUODENOSCOPY (EGD) WITH PROPOFOL N/A 02/20/2018   Procedure: ESOPHAGOGASTRODUODENOSCOPY (EGD) WITH PROPOFOL;  Surgeon: Carol Ada, MD;  Location: [facility] END[facility];  Service: Endoscopy;  Laterality: N/A;  . INGUINAL HERNIA REPAIR  2008  . INSERTION OF DIALYSIS CATHETER Right 02/19/2017   Procedure: INSERTION OF TUNNELED DIALYSIS CATHETER - RIGHT INTERNAL JUGULAR  PLACEMENT;  Surgeon: Rosetta Posner, MD;  Location: [place];  Service: Vascular;  Laterality: Right;  . IRIDOTOMY / IRIDECTOMY     Laser, right eye 12/26/11 left eye 01/24/12  . MASS EXCISION Left 05/07/2013   Procedure: EXCISION CYST;  Surgeon: Myrtha Mantis., MD;  Location: [place];  Service: Ophthalmology;  Laterality: Left;  . SAVORY DILATION N/A 02/20/2018   Procedure: SAVORY DILATION;  Surgeon: Carol Ada, MD;  Location: [facility];  Service: Endoscopy;  Laterality: N/A;     OB History   None      Home Medications    Prior to Admission medications   Medication Sig Start Date End Date Taking? Authorizing Provider  amoxicillin-clavulanate (AUGMENTIN) 500-125 MG tablet Take 1 tablet (500 mg total) by mouth daily. 04/21/18   Masoudi, Dorthula Rue, MD  aspirin EC 81 MG EC tablet Take 1 tablet (81 mg total) by mouth daily. Patient not taking: Reported on 04/07/2018 12/10/16   Kalman Shan Ratliff, DO  azithromycin (ZITHROMAX) 500 MG tablet Take 1 tablet (500 mg total) by mouth daily. 04/20/18   Masoudi, Dorthula Rue, MD  calcitRIOL (ROCALTROL) 0.5 MCG capsule Take 4 capsules (2 mcg total) by mouth every Monday, Wednesday, and Friday with hemodialysis. Patient not taking: Reported on 04/18/2018 12/30/17   Neva Seat, MD  diclofenac sodium (VOLTAREN) 1 % GEL Apply 4 g topically 4 (four) times daily. Patient not taking: Reported on 04/18/2018 01/20/18   Mosetta Anis, MD  diphenhydrAMINE-zinc acetate (BENADRYL) cream Apply topically 3 (three) times daily as needed for itching. 04/20/18   Masoudi, Dorthula Rue, MD  guaiFENesin (ROBITUSSIN) 100 MG/5ML SOLN Take 5 mLs (100 mg total) by mouth every 4 (four) hours as needed for cough or to loosen phlegm. 04/20/18   Masoudi, Elhamalsadat, MD  hydrocortisone cream 0.5 % Apply 1 application topically 2 (two) times daily. Patient not taking: Reported on 04/18/2018 03/05/18   Lars Mage, MD  latanoprost (XALATAN)  0.005 % ophthalmic solution Place 1 drop into both eyes at bedtime. 04/11/18   [provider]  mirtazapine (REMERON) 15 MG tablet Take 1 tablet (15 mg total) by mouth at bedtime. Patient not taking: Reported on 04/18/2018 02/01/18   [name], MD  nitroGLYCERIN (NITROSTAT) 0.3 MG SL tablet Place 1 tablet (0.3 mg total) under the tongue every 5 (five) minutes as needed for chest pain. Patient not taking: Reported on 04/18/2018 02/27/17   Sid Falcon, MD  oxyCODONE-acetaminophen (PERCOCET) 5-325 MG tablet Take 0.5-1 tablets by mouth every 4 (four) hours as needed. 04/30/18   Orpah Greek, MD  pantoprazole (PROTONIX) 40 MG tablet Take 1 tablet (40 mg total) by mouth daily. Patient taking differently: Take 40 mg by mouth daily as needed (acid reflux).  01/14/18 04/17/18  [name], MD  promethazine (PHENERGAN) 12.5 MG tablet Take 1 tablet (12.5 mg total) by mouth every 6 (six) hours as needed for nausea or vomiting (Take 1 prior to dialysis). Take 1 tablet half an hour before meal. Patient not taking: Reported on 04/18/2018 02/01/18   [name], MD    Family  History Family History  Problem Relation Age of Onset  . Hypertension Mother   . Congestive Heart Failure Mother   . Heart attack Brother 64    Social History Social History   Tobacco Use  . Smoking status: Never Smoker  . Smokeless tobacco: Never Used  Substance Use Topics  . Alcohol use: No    Alcohol/week: 0.0 standard drinks  . Drug use: No    Comment: 08/15/08 UDS + cocaine     Allergies   Hydrocodone and Tape   Review of Systems Review of Systems  Constitutional: Negative for chills and fever.  HENT: Negative for ear pain and sore throat.   Eyes: Negative for pain and visual disturbance.  Respiratory: Negative for cough and shortness of breath.   Cardiovascular: Negative for chest pain and palpitations.  Gastrointestinal: Negative for abdominal pain and vomiting.  Genitourinary:  Negative for dysuria and hematuria.  Musculoskeletal: Positive for arthralgias. Negative for back pain.  Skin: Negative for color change and rash.  Neurological: Negative for seizures, syncope and headaches.  All other systems reviewed and are negative.    Physical Exam Updated Vital Signs  ED Triage Vitals  Enc Vitals Group     BP 05/06/18 2145 (!) 164/65     Pulse Rate 05/06/18 2145 99     Resp 05/06/18 2145 14     Temp 05/06/18 2145 98.2 F (36.8 C)     Temp Source 05/06/18 2145 Oral     SpO2 05/06/18 2145 98 %     Weight --      Height --      Head Circumference --      Peak Flow --      Pain Score 05/06/18 2338 0     Pain Loc --      Pain Edu? --      Excl. in [place]? --     Physical Exam  Constitutional: She is oriented to person, place, and time. She appears well-developed and well-nourished. No distress.  HENT:  Head: Normocephalic and atraumatic.  Eyes: Pupils are equal, round, and reactive to light. Conjunctivae and EOM are normal.  Neck: Normal range of motion. Neck supple.  Cardiovascular: Normal rate, regular rhythm, normal heart sounds and intact distal pulses.  No murmur heard. Pulmonary/Chest: Effort normal and breath sounds normal. No respiratory distress.  Abdominal: Soft. There is no tenderness.  Musculoskeletal: Normal range of motion. She exhibits tenderness (TTP to right hip and knee). She exhibits no edema.  No midline spinal tenderness  Neurological: She is alert and oriented to person, place, and time.  Skin: Skin is warm and dry.  Psychiatric: She has a normal mood and affect.  Nursing note and vitals reviewed.    ED Treatments / Results  Labs (all labs ordered are listed, but only abnormal results are displayed) Labs Reviewed  CBC WITH DIFFERENTIAL/PLATELET - Abnormal; Notable for the following components:      Result Value   WBC 10.6 (*)    RBC 3.73 (*)    Hemoglobin 10.5 (*)    HCT 34.7 (*)    RDW 17.2 (*)    Neutro Abs 8.6 (*)     Abs Immature Granulocytes 0.22 (*)    All other components within normal limits  BASIC METABOLIC PANEL - Abnormal; Notable for the following components:   BUN 37 (*)    Creatinine, Ser 7.07 (*)    GFR calc non Af Amer 5 (*)    GFR calc  Af Amer 6 (*)    All other components within normal limits    EKG None  Radiology Dg Chest 1 View  Result Date: 05/06/2018 CLINICAL DATA:  Pain EXAM: CHEST  1 VIEW COMPARISON:  04/22/2018 FINDINGS: Stable cardiomegaly with aortic atherosclerosis. Slight interstitial edema. Interval removal of dialysis catheter. No pneumothorax. No overt pulmonary edema, effusion or pneumothorax. Skin fold artifact projects over the periphery of the left base. IMPRESSION: 1. Cardiomegaly with minimal interstitial edema. Stable moderate aortic atherosclerosis. 2. Interval removal of dialysis catheter.  No complicating features. Electronically Signed   By: Ashley Royalty M.D.   On: 05/06/2018 23:19   Ct Head Wo Contrast  Result Date: 05/06/2018 CLINICAL DATA:  Head trauma.  Fall from bed. EXAM: CT HEAD WITHOUT CONTRAST CT CERVICAL SPINE WITHOUT CONTRAST TECHNIQUE: Multidetector CT imaging of the head and cervical spine was performed following the standard protocol without intravenous contrast. Multiplanar CT image reconstructions of the cervical spine were also generated. COMPARISON:  Head CT 04/22/2018 FINDINGS: CT HEAD FINDINGS Brain: There is no mass, hemorrhage or extra-axial collection. The size and configuration of the ventricles and extra-axial CSF spaces are normal. There is an old left cerebellar infarct. There is periventricular hypoattenuation compatible with chronic microvascular disease. Vascular: Atherosclerotic calcification of the internal carotid arteries at the skull base. No abnormal hyperdensity of the major intracranial arteries or dural venous sinuses. Skull: The visualized skull base, calvarium and extracranial soft tissues are normal. Sinuses/Orbits: No fluid  levels or advanced mucosal thickening of the visualized paranasal sinuses. No mastoid or middle ear effusion. The orbits are normal. CT CERVICAL SPINE FINDINGS Alignment: No static subluxation. Facets are aligned. Occipital condyles are normally positioned. Skull base and vertebrae: No acute fracture. Soft tissues and spinal canal: No prevertebral fluid or swelling. No visible canal hematoma. Disc levels: No advanced spinal canal or neural foraminal stenosis. Upper chest: No pneumothorax, pulmonary nodule or pleural effusion. Other: Normal visualized paraspinal cervical soft tissues. IMPRESSION: 1. No acute abnormality of the brain or cervical spine. 2. Old left cerebellar infarct and findings of chronic small vessel ischemia. Electronically Signed   By: Ulyses Jarred M.D.   On: 05/06/2018 22:53   Ct Cervical Spine Wo Contrast  Result Date: 05/06/2018 CLINICAL DATA:  Head trauma.  Fall from bed. EXAM: CT HEAD WITHOUT CONTRAST CT CERVICAL SPINE WITHOUT CONTRAST TECHNIQUE: Multidetector CT imaging of the head and cervical spine was performed following the standard protocol without intravenous contrast. Multiplanar CT image reconstructions of the cervical spine were also generated. COMPARISON:  Head CT 04/22/2018 FINDINGS: CT HEAD FINDINGS Brain: There is no mass, hemorrhage or extra-axial collection. The size and configuration of the ventricles and extra-axial CSF spaces are normal. There is an old left cerebellar infarct. There is periventricular hypoattenuation compatible with chronic microvascular disease. Vascular: Atherosclerotic calcification of the internal carotid arteries at the skull base. No abnormal hyperdensity of the major intracranial arteries or dural venous sinuses. Skull: The visualized skull base, calvarium and extracranial soft tissues are normal. Sinuses/Orbits: No fluid levels or advanced mucosal thickening of the visualized paranasal sinuses. No mastoid or middle ear effusion. The orbits  are normal. CT CERVICAL SPINE FINDINGS Alignment: No static subluxation. Facets are aligned. Occipital condyles are normally positioned. Skull base and vertebrae: No acute fracture. Soft tissues and spinal canal: No prevertebral fluid or swelling. No visible canal hematoma. Disc levels: No advanced spinal canal or neural foraminal stenosis. Upper chest: No pneumothorax, pulmonary nodule or pleural effusion. Other: Normal  visualized paraspinal cervical soft tissues. IMPRESSION: 1. No acute abnormality of the brain or cervical spine. 2. Old left cerebellar infarct and findings of chronic small vessel ischemia. Electronically Signed   By: Ulyses Jarred M.D.   On: 05/06/2018 22:53   Dg Knee Complete 4 Views Right  Result Date: 05/06/2018 CLINICAL DATA:  Knee pain EXAM: RIGHT KNEE - COMPLETE 4+ VIEW COMPARISON:  02/17/2017 FINDINGS: Moderate advanced tricompartmental osteoarthritis of the knee without significant interval change. Extensive vascular calcifications are noted along the course of the femoral through tibial arteries. No acute fracture, significant joint effusion, malalignment nor bone destruction. IMPRESSION: Advanced tricompartmental osteoarthritis of the knee without significant change. No acute fracture. Electronically Signed   By: Ashley Royalty M.D.   On: 05/06/2018 23:07   Dg Hip Unilat With Pelvis 2-3 Views Right  Result Date: 05/06/2018 CLINICAL DATA:  Right hip pain. EXAM: DG HIP (WITH OR WITHOUT PELVIS) 2-3V RIGHT COMPARISON:  03/08/2018 FINDINGS: A vertical lucency is seen through the inferior right pubic ramus which changes in position between the pelvis and AP view of the right hip consistent with artifact, likely from overlying soft tissues. There is lower lumbar degenerative facet arthropathy. Calcified uterine fibroid projects over the mid pelvis. No pelvic diastasis is identified. Maintained right hip joint without proximal femoral fracture. Iliofemoral arteriosclerosis is identified.  IMPRESSION: 1. Lower lumbar facet arthropathy. 2. Calcified uterine fibroid. 3. No acute fracture joint dislocation of the right hip. If the patient has pain out of proportion to radiographic findings or clinical suspicion remains high for radiographically occult fracture, CT would be the study of choice. Electronically Signed   By: Ashley Royalty M.D.   On: 05/06/2018 23:13    Procedures Procedures (including critical care time)  Medications Ordered in ED Medications - No data to display   Initial Impression / Assessment and Plan / ED Course  I have reviewed the triage vital signs and the nursing notes.  Pertinent labs & imaging results that were available during my care of the patient were reviewed by me and considered in my medical decision making (see chart for details).     Nancy [name] is a 77 year old female history of end-stage renal disease on hemodialysis who presents to the ED after fall.  Patient with normal vitals.  No fever.  Patient slipped while getting out of the bathtub tonight.  Unknown if she hit her head or neck.  Patient has right hip pain, right knee pain.  Patient skipped dialysis today.  Patient recent hospital admission and currently working with physical therapy at home. Has had balance issues here recently.  Is working on possibly inpatient physical therapy.  Patient overall well-appearing.  Has tenderness to the right hip, right knee.  Patient with no midline spinal tenderness.  Given her age will get a head CT and neck CT.  We will get extremity x-rays.  Will check basic labs to make sure patient does not need emergent dialysis.  Patient with normal potassium.  Otherwise no significant electrolyte abnormality, creatinine at baseline.  Patient with no significant leukocytosis, anemia.  Patient had unremarkable head and neck imaging.  No fractures on hip x-ray or knee x-ray.  Recommend that the patient continue to work with physical therapy at home and told to use safe  ambulation.  Told to use her walker.  Continue to try to get inpatient care if needed which primary doctor is working on.  Discharged from ED in good condition.  Understand return  cautions.  This chart was dictated using voice recognition software.  Despite best efforts to proofread,  errors can occur which can change the documentation meaning.   Final Clinical Impressions(s) / ED Diagnoses   Final diagnoses:  Fall, initial encounter    ED Discharge Orders    None       Lennice Sites, DO 05/06/18 2355

## 2018-05-06 NOTE — Telephone Encounter (Addendum)
Pt's granddaughter calls and states pt was recently released from hosp and was advised to go into rehab at that time, pt refused but since coming home and seeing family is having a very hard time providing care for her now would like to enter a facility either for rehab or continued care.  Called juana wallace [facility] RN, will ask dr Dareen Piano for referral to [facility] CSW. grdaughter will make contact with facilities that family is interested in and also speak to elder atty for assist w/ POA and medPOA Dr Dareen Piano could you please put a [facility] social work referral in for pt, spoke to grdaughter again and she is having problems trying to get everything in order and needs guidance

## 2018-05-06 NOTE — Telephone Encounter (Signed)
Placed referral. Let me know if you need anything else. Thanks

## 2018-05-06 NOTE — ED Triage Notes (Signed)
Per EMS pt from home. Slid from bed tonight. No injury, did not hit head, no loc, no blood thinners. Does have chronic neck and back pain. Seen here recently for same. Daughter called ems to get checked out. Pt has M,W,F dialysis and did go to dialysis on Wednesday.  VSS

## 2018-05-07 ENCOUNTER — Other Ambulatory Visit: Payer: Self-pay

## 2018-05-07 ENCOUNTER — Telehealth: Payer: Self-pay

## 2018-05-07 ENCOUNTER — Telehealth: Payer: Self-pay | Admitting: Internal Medicine

## 2018-05-07 DIAGNOSIS — Z992 Dependence on renal dialysis: Secondary | ICD-10-CM | POA: Diagnosis not present

## 2018-05-07 DIAGNOSIS — I150 Renovascular hypertension: Secondary | ICD-10-CM | POA: Diagnosis not present

## 2018-05-07 DIAGNOSIS — E162 Hypoglycemia, unspecified: Secondary | ICD-10-CM | POA: Diagnosis not present

## 2018-05-07 DIAGNOSIS — I447 Left bundle-branch block, unspecified: Secondary | ICD-10-CM | POA: Diagnosis not present

## 2018-05-07 DIAGNOSIS — E161 Other hypoglycemia: Secondary | ICD-10-CM | POA: Diagnosis not present

## 2018-05-07 DIAGNOSIS — N186 End stage renal disease: Secondary | ICD-10-CM | POA: Diagnosis not present

## 2018-05-07 DIAGNOSIS — R0902 Hypoxemia: Secondary | ICD-10-CM | POA: Diagnosis not present

## 2018-05-07 DIAGNOSIS — R0689 Other abnormalities of breathing: Secondary | ICD-10-CM | POA: Diagnosis not present

## 2018-05-07 NOTE — Telephone Encounter (Signed)
Would like to speak with [name]. Please call back.

## 2018-05-07 NOTE — Telephone Encounter (Signed)
Spoke to grdaughter this am, spoke to juanaw. [facility] and dr Dareen Piano. [facility] is working on this now. Will schedule for appt tomorrow [facility] Spoke to grdaughter and she states pt has refused dialysis this week, has not had since Sunday. Pt is confused and calling EMS several times daily to come see about her. She has refused EMS transport but grdaughter states she will get her to clinic in am at [address] [facility]

## 2018-05-07 NOTE — Patient Outreach (Signed)
[facility]) Care Management  05/07/2018  Nancy [name] [birth year] [id number]   RNCM received phone call from Freddy Finner (Internal Medicine clinic) requesting assistance with getting client into SNF. She reports that client's daughter called today with update that client was in the ED last night due to fall. [name] assigned RNCM notified.    Thea Silversmith, RN, MSN, [place] Coordinator Cell: [phone]

## 2018-05-07 NOTE — Telephone Encounter (Signed)
Verbal order; Shanon Brow [phone] [facility]

## 2018-05-07 NOTE — Patient Outreach (Signed)
[name]) Care Management  05/07/2018  Nancy [name] June 14, 1941 [id number]   TELEPHONE SCREENING Referral date: 05/06/18 Referral source: primary MD office Referral reason: Needs evaluation for possible SNF placement for rehab  Insurance: [employer]  Received update from [place]  that patient will be seeing her primary doctor on 05/08/18 to further discuss skilled nursing facility  placement.   Quinn Plowman RN,BSN,CCM [facility] Telephonic  [phone]

## 2018-05-07 NOTE — Telephone Encounter (Signed)
Pls call pt grand daughter back [phone]

## 2018-05-07 NOTE — Addendum Note (Signed)
Addended by: Darvin Neighbours on: 05/07/2018 11:59 AM   Modules accepted: Level of Service, SmartSet

## 2018-05-07 NOTE — Telephone Encounter (Signed)
This encounter was created in error - please disregard.

## 2018-05-08 ENCOUNTER — Observation Stay (HOSPITAL_COMMUNITY): Payer: Medicare Other

## 2018-05-08 ENCOUNTER — Ambulatory Visit (INDEPENDENT_AMBULATORY_CARE_PROVIDER_SITE_OTHER): Payer: Medicare Other | Admitting: Internal Medicine

## 2018-05-08 ENCOUNTER — Other Ambulatory Visit: Payer: Self-pay

## 2018-05-08 ENCOUNTER — Inpatient Hospital Stay (HOSPITAL_COMMUNITY)
Admission: AD | Admit: 2018-05-08 | Discharge: 2018-05-14 | DRG: 551 | Disposition: A | Discharge status: Skilled Nursing Facility | Payer: Medicare Other | Attending: Internal Medicine | Admitting: Internal Medicine

## 2018-05-08 VITALS — BP 150/63 | HR 91 | Temp 97.6°F | Ht 63.0 in

## 2018-05-08 DIAGNOSIS — R634 Abnormal weight loss: Secondary | ICD-10-CM | POA: Diagnosis present

## 2018-05-08 DIAGNOSIS — N186 End stage renal disease: Secondary | ICD-10-CM

## 2018-05-08 DIAGNOSIS — Z992 Dependence on renal dialysis: Secondary | ICD-10-CM

## 2018-05-08 DIAGNOSIS — R011 Cardiac murmur, unspecified: Secondary | ICD-10-CM | POA: Diagnosis present

## 2018-05-08 DIAGNOSIS — Z885 Allergy status to narcotic agent status: Secondary | ICD-10-CM

## 2018-05-08 DIAGNOSIS — F0390 Unspecified dementia without behavioral disturbance: Secondary | ICD-10-CM | POA: Diagnosis present

## 2018-05-08 DIAGNOSIS — G8929 Other chronic pain: Secondary | ICD-10-CM | POA: Diagnosis not present

## 2018-05-08 DIAGNOSIS — G9340 Encephalopathy, unspecified: Secondary | ICD-10-CM | POA: Diagnosis not present

## 2018-05-08 DIAGNOSIS — M170 Bilateral primary osteoarthritis of knee: Secondary | ICD-10-CM | POA: Diagnosis not present

## 2018-05-08 DIAGNOSIS — J90 Pleural effusion, not elsewhere classified: Secondary | ICD-10-CM

## 2018-05-08 DIAGNOSIS — Y93E1 Activity, personal bathing and showering: Secondary | ICD-10-CM

## 2018-05-08 DIAGNOSIS — R51 Headache: Secondary | ICD-10-CM | POA: Diagnosis not present

## 2018-05-08 DIAGNOSIS — M545 Low back pain: Principal | ICD-10-CM | POA: Diagnosis present

## 2018-05-08 DIAGNOSIS — Z515 Encounter for palliative care: Secondary | ICD-10-CM | POA: Diagnosis not present

## 2018-05-08 DIAGNOSIS — Z9049 Acquired absence of other specified parts of digestive tract: Secondary | ICD-10-CM

## 2018-05-08 DIAGNOSIS — I5032 Chronic diastolic (congestive) heart failure: Secondary | ICD-10-CM | POA: Diagnosis not present

## 2018-05-08 DIAGNOSIS — R410 Disorientation, unspecified: Secondary | ICD-10-CM

## 2018-05-08 DIAGNOSIS — Z91048 Other nonmedicinal substance allergy status: Secondary | ICD-10-CM

## 2018-05-08 DIAGNOSIS — D631 Anemia in chronic kidney disease: Secondary | ICD-10-CM | POA: Diagnosis present

## 2018-05-08 DIAGNOSIS — Z66 Do not resuscitate: Secondary | ICD-10-CM | POA: Diagnosis present

## 2018-05-08 DIAGNOSIS — M549 Dorsalgia, unspecified: Secondary | ICD-10-CM | POA: Diagnosis present

## 2018-05-08 DIAGNOSIS — I120 Hypertensive chronic kidney disease with stage 5 chronic kidney disease or end stage renal disease: Secondary | ICD-10-CM | POA: Diagnosis not present

## 2018-05-08 DIAGNOSIS — Z90710 Acquired absence of both cervix and uterus: Secondary | ICD-10-CM

## 2018-05-08 DIAGNOSIS — I132 Hypertensive heart and chronic kidney disease with heart failure and with stage 5 chronic kidney disease, or end stage renal disease: Secondary | ICD-10-CM | POA: Diagnosis not present

## 2018-05-08 DIAGNOSIS — Z9115 Patient's noncompliance with renal dialysis: Secondary | ICD-10-CM

## 2018-05-08 DIAGNOSIS — Z5329 Procedure and treatment not carried out because of patient's decision for other reasons: Secondary | ICD-10-CM | POA: Diagnosis not present

## 2018-05-08 DIAGNOSIS — I672 Cerebral atherosclerosis: Secondary | ICD-10-CM | POA: Diagnosis present

## 2018-05-08 DIAGNOSIS — R451 Restlessness and agitation: Secondary | ICD-10-CM | POA: Diagnosis not present

## 2018-05-08 DIAGNOSIS — Z8673 Personal history of transient ischemic attack (TIA), and cerebral infarction without residual deficits: Secondary | ICD-10-CM

## 2018-05-08 DIAGNOSIS — Z9119 Patient's noncompliance with other medical treatment and regimen: Secondary | ICD-10-CM

## 2018-05-08 DIAGNOSIS — N289 Disorder of kidney and ureter, unspecified: Secondary | ICD-10-CM | POA: Diagnosis not present

## 2018-05-08 DIAGNOSIS — Z9181 History of falling: Secondary | ICD-10-CM | POA: Diagnosis not present

## 2018-05-08 DIAGNOSIS — R454 Irritability and anger: Secondary | ICD-10-CM | POA: Diagnosis not present

## 2018-05-08 DIAGNOSIS — I350 Nonrheumatic aortic (valve) stenosis: Secondary | ICD-10-CM | POA: Diagnosis not present

## 2018-05-08 DIAGNOSIS — Z7189 Other specified counseling: Secondary | ICD-10-CM | POA: Diagnosis not present

## 2018-05-08 DIAGNOSIS — R531 Weakness: Secondary | ICD-10-CM | POA: Diagnosis not present

## 2018-05-08 DIAGNOSIS — N2581 Secondary hyperparathyroidism of renal origin: Secondary | ICD-10-CM | POA: Diagnosis not present

## 2018-05-08 DIAGNOSIS — S199XXA Unspecified injury of neck, initial encounter: Secondary | ICD-10-CM | POA: Diagnosis not present

## 2018-05-08 DIAGNOSIS — K219 Gastro-esophageal reflux disease without esophagitis: Secondary | ICD-10-CM | POA: Diagnosis not present

## 2018-05-08 DIAGNOSIS — Z8249 Family history of ischemic heart disease and other diseases of the circulatory system: Secondary | ICD-10-CM | POA: Diagnosis not present

## 2018-05-08 DIAGNOSIS — N2889 Other specified disorders of kidney and ureter: Secondary | ICD-10-CM | POA: Diagnosis present

## 2018-05-08 DIAGNOSIS — W182XXA Fall in (into) shower or empty bathtub, initial encounter: Secondary | ICD-10-CM | POA: Diagnosis present

## 2018-05-08 DIAGNOSIS — I5030 Unspecified diastolic (congestive) heart failure: Secondary | ICD-10-CM | POA: Diagnosis not present

## 2018-05-08 DIAGNOSIS — I10 Essential (primary) hypertension: Secondary | ICD-10-CM | POA: Diagnosis present

## 2018-05-08 LAB — CBC
HCT: 30.3 % — ABNORMAL LOW (ref 36.0–46.0)
Hemoglobin: 8.9 g/dL — ABNORMAL LOW (ref 12.0–15.0)
MCH: 27.1 pg (ref 26.0–34.0)
MCHC: 29.4 g/dL — ABNORMAL LOW (ref 30.0–36.0)
MCV: 92.1 fL (ref 80.0–100.0)
Platelets: 223 10*3/uL (ref 150–400)
RBC: 3.29 MIL/uL — ABNORMAL LOW (ref 3.87–5.11)
RDW: 17.3 % — ABNORMAL HIGH (ref 11.5–15.5)
WBC: 9 10*3/uL (ref 4.0–10.5)
nRBC: 0 % (ref 0.0–0.2)

## 2018-05-08 LAB — BASIC METABOLIC PANEL
Anion gap: 12 (ref 5–15)
BUN: 50 mg/dL — ABNORMAL HIGH (ref 8–23)
CO2: 25 mmol/L (ref 22–32)
Calcium: 9.3 mg/dL (ref 8.9–10.3)
Chloride: 100 mmol/L (ref 98–111)
Creatinine, Ser: 8.96 mg/dL — ABNORMAL HIGH (ref 0.44–1.00)
GFR calc Af Amer: 4 mL/min — ABNORMAL LOW (ref >60)
GFR calc non Af Amer: 4 mL/min — ABNORMAL LOW (ref >60)
Glucose, Bld: 102 mg/dL — ABNORMAL HIGH (ref 70–99)
Potassium: 4.1 mmol/L (ref 3.5–5.1)
Sodium: 137 mmol/L (ref 135–145)

## 2018-05-08 MED ORDER — PANTOPRAZOLE SODIUM 40 MG PO TBEC
40.0000 mg | DELAYED_RELEASE_TABLET | Freq: Every day | ORAL | Status: DC
  Administered 2018-05-09 – 2018-05-14 (×5): 40 mg via ORAL
  Filled 2018-05-08 (×6): qty 1

## 2018-05-08 MED ORDER — CHLORHEXIDINE GLUCONATE CLOTH 2 % EX PADS
6.0000 | MEDICATED_PAD | Freq: Every day | CUTANEOUS | Status: DC
  Administered 2018-05-10 – 2018-05-11 (×2): 6 via TOPICAL

## 2018-05-08 MED ORDER — CHLORHEXIDINE GLUCONATE CLOTH 2 % EX PADS: 6.0000 | MEDICATED_PAD | Freq: Every day | CUTANEOUS | Status: DC

## 2018-05-08 MED ORDER — DARBEPOETIN ALFA 150 MCG/0.3ML IJ SOSY: 150.0000 ug | PREFILLED_SYRINGE | INTRAMUSCULAR | Status: DC

## 2018-05-08 MED ORDER — HEPARIN SODIUM (PORCINE) 1000 UNIT/ML DIALYSIS: 1800.0000 [IU] | Freq: Once | INTRAMUSCULAR | Status: DC

## 2018-05-08 MED ORDER — SENNOSIDES-DOCUSATE SODIUM 8.6-50 MG PO TABS
1.0000 | ORAL_TABLET | Freq: Every evening | ORAL | Status: DC | PRN
  Administered 2018-05-13: 1 via ORAL
  Filled 2018-05-08: qty 1

## 2018-05-08 MED ORDER — CALCITRIOL 0.5 MCG PO CAPS
1.7500 ug | ORAL_CAPSULE | ORAL | Status: DC
  Administered 2018-05-11 – 2018-05-13 (×2): 1.75 ug via ORAL

## 2018-05-08 MED ORDER — ACETAMINOPHEN 325 MG PO TABS
650.0000 mg | ORAL_TABLET | Freq: Four times a day (QID) | ORAL | Status: DC | PRN
  Administered 2018-05-08 – 2018-05-11 (×4): 650 mg via ORAL
  Filled 2018-05-08 (×4): qty 2

## 2018-05-08 MED ORDER — HEPARIN SODIUM (PORCINE) 5000 UNIT/ML IJ SOLN
5000.0000 [IU] | Freq: Three times a day (TID) | INTRAMUSCULAR | Status: DC
  Administered 2018-05-08 – 2018-05-13 (×11): 5000 [IU] via SUBCUTANEOUS
  Filled 2018-05-08 (×14): qty 1

## 2018-05-08 MED ORDER — PRO-STAT SUGAR FREE PO LIQD
30.0000 mL | Freq: Two times a day (BID) | ORAL | Status: DC
  Administered 2018-05-09 – 2018-05-14 (×9): 30 mL via ORAL
  Filled 2018-05-08 (×12): qty 30

## 2018-05-08 MED ORDER — LIDOCAINE 5 % EX PTCH
1.0000 | MEDICATED_PATCH | CUTANEOUS | Status: DC
  Administered 2018-05-08 – 2018-05-13 (×2): 1 via TRANSDERMAL
  Filled 2018-05-08 (×5): qty 1

## 2018-05-08 MED ORDER — OXYCODONE HCL 5 MG PO TABS
2.5000 mg | ORAL_TABLET | ORAL | Status: DC | PRN
  Administered 2018-05-08 – 2018-05-14 (×18): 2.5 mg via ORAL
  Filled 2018-05-08 (×16): qty 1

## 2018-05-08 MED ORDER — NEPRO/CARBSTEADY PO LIQD
237.0000 mL | ORAL | Status: DC
  Administered 2018-05-11 – 2018-05-12 (×2): 237 mL via ORAL
  Filled 2018-05-08 (×7): qty 237

## 2018-05-08 MED ORDER — ACETAMINOPHEN 650 MG RE SUPP: 650.0000 mg | Freq: Four times a day (QID) | RECTAL | Status: DC | PRN

## 2018-05-08 NOTE — H&P (Signed)
Date: 05/08/2018               Patient Name:  Gib[place] B Malveaux MRN: [id number]  DOB: 05-16-1941 Age / Sex: 77 y.o., female   PCP: Aldine Contes, MD         Medical Service: Internal Medicine Teaching Service         Attending Physician: Dr. Rebeca Alert Raynaldo Opitz, MD    First Contact: Dr. Tarri Abernethy  Pager: [phone]  Second Contact: Dr. Myrtie Hawk Pager: [phone]       After Hours (After 5p/  First Contact Pager: [phone]  weekends / holidays): Second Contact Pager: [phone]   Chief Complaint: Back pain  History of Present Illness: Ms. [place] B Griffo is a 77 year old female with significant past medical history of end-stage renal disease on hemodialysis MWF, GERD, CVA, hypertension and  heart failure with preserved ejection fraction who presented to the internal medicine clinic for acute on chronic back pain with associated leg weakness. Granddaughter and great-granddaughter helped provide the history.  Patient reports a 1-2 week history of worsening back pain and leg weakness for which she has been evaluated in the [facility] ED on 10/23 and 10/24. Subsequently she went to [facility] on the 24th after being discharged from the [facility] ED for further evaluation of her back pain.  Imaging of her lumbar spine, was negative for fracture or any acute abnormality. Prior to being discharged from [facility] she declined SNF placement.   She recently fell in the bathtub on 10/30 due to her leg weakness. She denied losing consciousness.  She again presented to the [facility] ED and imaging was unremarkable including CT head, CT cervical spine wo contrast, knee x ray and hip x-ray. Patient and grand daughter denied any events since discharge from [facility] ed on oct 30th.  Today she presents for re-evaluation of back pain and inability to do her ADLs.  Ms. Pretlow last had dialysis on Sunday 10/27.  Per family they feel patient is more confused than usual.  Her PCP was present in clinic today and  does not feel she is at her baseline.  She was able to tell me she was in [place] at [facility], it was Friday and November.  She states she does not want SNF placement at this time.      Meds: lidocaine patch, tylenol, senokot-S  Allergies: Allergies as of 05/08/2018 - Review Complete 05/08/2018  Allergen Reaction Noted  . Hydrocodone Nausea And Vomiting and Other (See Comments) 12/26/2017  . Tape Other (See Comments) 04/17/2018   Past Medical History:  Diagnosis Date  . Anemia   . Aortic stenosis   . Bacterial sinusitis 09/17/2011  . CHF (congestive heart failure) ([place])   . CKD (chronic kidney disease) stage 4, GFR 15-29 ml/min ([place]) 08/11/2006   Cr continues to increase. Proteinuria on UA 02/10/12.    . Colitis   . CVA (cerebrovascular accident) [facility])    New hemorrhagic per CT scan '09  . Diverticulosis of colon   . Dysfunctional uterine bleeding   . ESRD (end stage renal disease) on dialysis ([place])    "MWF; [facility]" (11/27/2017)  . Fecal impaction ([place])   . Headache(784.0)   . Heart murmur   . HERNIORRHAPHY, HX OF 08/11/2006  . Hypertension   . OA (osteoarthritis)    bilateral knees  . Postmenopausal   . Pulmonary nodule   . TINEA CRURIS 01/12/2007  Family History:  Family History  Problem Relation Age of Onset  . Hypertension Mother   . Congestive Heart Failure Mother   . Heart attack Brother 17    Social History:  Tobacco abuse: denies Alcohol abuse: denies IVDU:  denies  Review of Systems: A complete ROS was negative except as per HPI. Review of Systems  Constitutional: Negative for chills and fever.  Respiratory: Negative for shortness of breath.   Cardiovascular: Negative for chest pain.  Neurological: Negative for dizziness.     Physical Exam: Blood pressure (!) 134/56, pulse 86, temperature 97.8 F (36.6 C), temperature source Oral, resp. rate 18, SpO2 98 %. Physical Exam  Constitutional: She is well-developed, well-nourished, and  in no distress. No distress.  A&O x 3  Cardiovascular: Normal rate, regular rhythm and normal heart sounds. Exam reveals no gallop and no friction rub.  No murmur heard. Pulmonary/Chest: Effort normal and breath sounds normal. No respiratory distress. She has no wheezes. She has no rales.  Musculoskeletal: She exhibits no edema.  Lumbar tenderness on paraspinal musculature 5/5 strength of lower extremity on the right 4/5 strength of lower extremity on the left Sensation intact  Neurological: No cranial nerve deficit.  Gait instability     Assessment & Plan by Problem: Active Problems:   Back pain  Acute on chronic right-sided back pain with associated weakness Patient has had multiple ED visits for acute back pain in the last week and a half.  Patient was also admitted to [facility] 10/24-10/29 for back pain and HD.  All imaging has been unremarkable at this point. Since patient is still having pain in the lumbar region and is having weakness will order CT lumbar without contrast.  At this time patient is declining SNF.  Will order PT, heating pad, tylenol and lidocain patch to help with symptoms. -PT eval and treat -K pad -Tylenol and lidocaine patch -CT lumbar without contrast  End-stage renal disease on hemodialysis MWF Patient last had dialysis on Sunday. Per family patient is not at baseline and appears more confused. On exam patient is able to answer questions appropriately and is alert and oriented 3. At this time patient does not appear uremic. Will get BMET and discuss with nephrology for inpatient hemodialysis. Patient has had multiple admissions due to noncompliance with hemodialysis outpatient leading to uremic symptoms. Goals of care may need to be discussed this admission. -Nephrology for inpatient HD - consider goals of care discussion   Hx Gerd Continue Home pantoprazole  DVT prophx: subqheparin Diet: Renal diet Bowel: Senokot Code: DNR, DNI  Dispo: Admit  patient to Observation with expected length of stay less than 2 midnights.  Signed: Valinda Party, DO 05/08/2018, 12:45 PM  Pager: [phone]

## 2018-05-08 NOTE — Progress Notes (Signed)
   05/08/18 2010  Provider Notification  Provider Name/Title dr. Eileen Stanford  Date Provider Notified 05/08/18  Time Provider Notified 2010  Notification Type Page  Notification Reason Change in status (Refusing Tele)  Response Other (Comment) (see progress note)  Date of Provider Response 05/08/18  Time of Provider Response 2013   Patient very agitated.  Screaming at staff, pulling telemetry leads off, and will not allow staff to put back on.  MD made aware of situation.  Will placed telemetry on standby and attempt later in the night to get patient to allow for telemetry to be put back on.  Earleen Reaper RN

## 2018-05-08 NOTE — Progress Notes (Signed)
Patient continues to state that she is not going to do anything until the doctor comes up and gives her a shot into her back. I was unaware of this. MD notified. MD stated that she was unaware of a doctor giving her a shot, but that she would come up and speak with the patient. Will continue to monitor.

## 2018-05-08 NOTE — Progress Notes (Signed)
New Admission Note:   Arrival Method: WC Mental Orientation: A&OX4 Telemetry: Initiated Assessment: Completed Skin: WDL IV: IV team consulted Pain: 10/10 Safety Measures: Safety Fall Prevention Plan has been given, discussed and signed Admission: Completed Unit Orientation: Patient has been orientated to the room, unit and staff.  Family: Granddaughter at bedside   Orders have been reviewed and implemented. Will continue to monitor the patient. Call light has been placed within reach and bed alarm has been activated.    Aneta Mins BSN, RN

## 2018-05-08 NOTE — Progress Notes (Signed)
PT Cancellation Note  Patient Details Name: Nancy [name] MRN: [id number] DOB: Nov 06, 1940   Cancelled Treatment:    Reason Eval/Treat Not Completed: Patient at procedure or test/unavailable; patient down for CT.  Will attempt later if time permits.    Reginia Naas 05/08/2018, 3:08 PM  Magda Kiel, [place] [phone] 05/08/2018

## 2018-05-08 NOTE — Telephone Encounter (Signed)
I agree

## 2018-05-08 NOTE — Progress Notes (Signed)
PT Cancellation Note  Patient Details Name: Nancy [name] MRN: [id number] DOB: 01-07-1941   Cancelled Treatment:    Reason Eval/Treat Not Completed: Patient declined, no reason specified; attempted to see pt after back from CT scan and reported she was not getting up OOB due to wanting to get her abdominal CT done then her shot in her back and go home.  Did not respond to encouragement.  RN attempted as well and pt became upset and continued to decline.  Will attempt another day.   Reginia Naas 05/08/2018, 5:00 PM  Magda Kiel, [place] [phone] 05/08/2018

## 2018-05-08 NOTE — Progress Notes (Signed)
Report called to Anisha on 5 M.  Patient transported to 20M 10 via wheelchair.  Patient is accompanied by granddaughter and great granddaughter. Sander Nephew, RN 05/08/2018 11:33 AM.

## 2018-05-08 NOTE — Consult Note (Signed)
Referring Provider: No ref. provider found Primary Care Physician:  Aldine Contes, MD Primary Nephrologist:  Dr. Joelyn Oms  Reason for Consultation: Medical management end-stage renal disease, anemia uremia hyperparathyroidism maintenance of euvolemia  HPI: This is a 77 year old lady with a history of end-stage renal disease Monday Wednesday Friday dialysis, gastroesophageal reflux disease CVA hypertension and congestive heart failure with diastolic dysfunction, aortic stenosis.  She has been complaining of lower back pain leg weakness and presented to the internal medicine clinic, subsequently admitted to [facility].  She was seen in [facility] emergency room for evaluation of back pain and underwent imaging that was negative for fracture.    She was admitted to [facility] on 05/01/2018 to 05/06/2018 for the same complaints of back pain.  She has been reported by [name], her granddaughter.  That her mother frequently misses dialysis and does not like going to dialysis.  She is noncompliant with her dialysis treatments holding to note at [facility] during October admission.  Chest x-ray revealed enlarged pericardial silhouette within diffuse interstitial opacities patchy bibasilar were also seen as well as a rounded opacity superior right mediastinum corresponding to a tortuousbrachiocephalic artery.  05/05/2018 CT scan of head without contrast revealed hypodensity in left thalamus consistent with a lacunar infarct indeterminate age similar hypodensities were found in the posterior external capsule age-indeterminate global cerebral volume loss patchy periventricular and deep white matter changes with chronic microvascular ischemic disease intracranial atherosclerosis.  X-ray of lumbar spine revealed no evidence of acute fracture w.  Ith the spaces were maintained with mild facet degenerative disease L5-S1.  Nephrology was consulted for continuation of medical management  of end-stage renal disease treatments of anemia management of uremic symptoms hyperparathyroidism and maintenance of euvolemia.  Blood pressure 135/65 pulse of 78 temperature 97.6 O2 saturations 97% room air Sodium 137 potassium 4.1 chloride 100 CO2 25 BUN 50 creatinine 8.96 glucose 102 calcium 9.3 WBC 9.0 hemoglobin 8.9 platelets 223   Past Medical History:  Diagnosis Date  . Anemia   . Aortic stenosis   . Bacterial sinusitis 09/17/2011  . CHF (congestive heart failure) ([place])   . CKD (chronic kidney disease) stage 4, GFR 15-29 ml/min ([place]) 08/11/2006   Cr continues to increase. Proteinuria on UA 02/10/12.    . Colitis   . CVA (cerebrovascular accident) [facility])    New hemorrhagic per CT scan '09  . Diverticulosis of colon   . Dysfunctional uterine bleeding   . ESRD (end stage renal disease) on dialysis ([facility])    "MWF; [address]" (11/27/2017)  . Fecal impaction ([place])   . Headache(784.0)   . Heart murmur   . HERNIORRHAPHY, HX OF 08/11/2006  . Hypertension   . OA (osteoarthritis)    bilateral knees  . Postmenopausal   . Pulmonary nodule   . TINEA CRURIS 01/12/2007    Past Surgical History:  Procedure Laterality Date  . ABDOMINAL HYSTERECTOMY    . AV FISTULA PLACEMENT Left 02/19/2017   Procedure: CREATION OF LEFT ARM BRACHIOCEPHALIC ARTERIOVENOUS (AV) FISTULA;  Surgeon: Rosetta Posner, MD;  Location: [place];  Service: Vascular;  Laterality: Left;  . BASCILIC VEIN TRANSPOSITION Left 04/23/2017   Procedure: BASILIC VEIN TRANSPOSITION SECOND STAGE;  Surgeon: Rosetta Posner, MD;  Location: [place];  Service: Vascular;  Laterality: Left;  . BIOPSY  02/20/2018   Procedure: BIOPSY;  Surgeon: Carol Ada, MD;  Location: [facility] ENDOSCOPY;  Service: Endoscopy;;  . BIOPSY  03/07/2018   Procedure: BIOPSY;  Surgeon: Carol Ada, MD;  Location: [facility] ENDOSCOPY;  Service: Endoscopy;;  . CHOLECYSTECTOMY  2009  . COLONOSCOPY    . ESOPHAGOGASTRODUODENOSCOPY N/A 03/07/2018   Procedure: ESOPHAGOGASTRODUODENOSCOPY  (EGD);  Surgeon: Carol Ada, MD;  Location: [place];  Service: Endoscopy;  Laterality: N/A;  . ESOPHAGOGASTRODUODENOSCOPY (EGD) WITH PROPOFOL N/A 02/20/2018   Procedure: ESOPHAGOGASTRODUODENOSCOPY (EGD) WITH PROPOFOL;  Surgeon: Carol Ada, MD;  Location: [facility] ENDOSCOPY;  Service: Endoscopy;  Laterality: N/A;  . INGUINAL HERNIA REPAIR  2008  . INSERTION OF DIALYSIS CATHETER Right 02/19/2017   Procedure: INSERTION OF TUNNELED DIALYSIS CATHETER - RIGHT INTERNAL JUGULAR PLACEMENT;  Surgeon: Rosetta Posner, MD;  Location: [place];  Service: Vascular;  Laterality: Right;  . IRIDOTOMY / IRIDECTOMY     Laser, right eye 12/26/11 left eye 01/24/12  . MASS EXCISION Left 05/07/2013   Procedure: EXCISION CYST;  Surgeon: Myrtha Mantis., MD;  Location: [place];  Service: Ophthalmology;  Laterality: Left;  . SAVORY DILATION N/A 02/20/2018   Procedure: SAVORY DILATION;  Surgeon: Carol Ada, MD;  Location: [facility];  Service: Endoscopy;  Laterality: N/A;    Prior to Admission medications   Medication Sig Start Date End Date Taking? Authorizing Provider  amoxicillin-clavulanate (AUGMENTIN) 500-125 MG tablet Take 1 tablet (500 mg total) by mouth daily. 04/21/18   Masoudi, Dorthula Rue, MD  aspirin EC 81 MG EC tablet Take 1 tablet (81 mg total) by mouth daily. Patient not taking: Reported on 04/07/2018 12/10/16   Kalman Shan Ratliff, DO  azithromycin (ZITHROMAX) 500 MG tablet Take 1 tablet (500 mg total) by mouth daily. 04/20/18   Masoudi, Dorthula Rue, MD  calcitRIOL (ROCALTROL) 0.5 MCG capsule Take 4 capsules (2 mcg total) by mouth every Monday, Wednesday, and Friday with hemodialysis. Patient not taking: Reported on 04/18/2018 12/30/17   Neva Seat, MD  diclofenac sodium (VOLTAREN) 1 % GEL Apply 4 g topically 4 (four) times daily. Patient not taking: Reported on 04/18/2018 01/20/18   Mosetta Anis, MD  diphenhydrAMINE-zinc acetate (BENADRYL) cream Apply topically 3  (three) times daily as needed for itching. 04/20/18   Masoudi, Dorthula Rue, MD  guaiFENesin (ROBITUSSIN) 100 MG/5ML SOLN Take 5 mLs (100 mg total) by mouth every 4 (four) hours as needed for cough or to loosen phlegm. 04/20/18   Masoudi, Elhamalsadat, MD  hydrocortisone cream 0.5 % Apply 1 application topically 2 (two) times daily. Patient not taking: Reported on 04/18/2018 03/05/18   Lars Mage, MD  latanoprost (XALATAN) 0.005 % ophthalmic solution Place 1 drop into both eyes at bedtime. 04/11/18   [provider]  mirtazapine (REMERON) 15 MG tablet Take 1 tablet (15 mg total) by mouth at bedtime. Patient not taking: Reported on 04/18/2018 02/01/18   [name], MD  nitroGLYCERIN (NITROSTAT) 0.3 MG SL tablet Place 1 tablet (0.3 mg total) under the tongue every 5 (five) minutes as needed for chest pain. Patient not taking: Reported on 04/18/2018 02/27/17   Sid Falcon, MD  oxyCODONE-acetaminophen (PERCOCET) 5-325 MG tablet Take 0.5-1 tablets by mouth every 4 (four) hours as needed. 04/30/18   Orpah Greek, MD  pantoprazole (PROTONIX) 40 MG tablet Take 1 tablet (40 mg total) by mouth daily. Patient taking differently: Take 40 mg by mouth daily as needed (acid reflux).  01/14/18 04/17/18  [name], MD  promethazine (PHENERGAN) 12.5 MG tablet Take 1 tablet (12.5 mg total) by mouth every 6 (six) hours as needed for nausea or vomiting (Take 1 prior to dialysis). Take 1 tablet  half an hour before meal. Patient not taking: Reported on 04/18/2018 02/01/18   [name], MD    Current Facility-Administered Medications  Medication Dose Route Frequency Provider Last Rate Last Dose  . acetaminophen (TYLENOL) tablet 650 mg  650 mg Oral Q6H PRN Kalman Shan Ratliff, DO   650 mg at 05/08/18 1313   Or  . acetaminophen (TYLENOL) suppository 650 mg  650 mg Rectal Q6H PRN Kalman Shan Ratliff, DO      . [START ON 05/09/2018] Chlorhexidine Gluconate Cloth 2 % PADS 6 each  6  each Topical Q0600 Edrick Oh, MD      . Darbepoetin Alfa Kyra Searles) injection 150 mcg  150 mcg Intravenous Q Toma Aran, MD      . heparin injection 1,800 Units  1,800 Units Dialysis Once in dialysis Roney Jaffe, MD      . heparin injection 5,000 Units  5,000 Units Subcutaneous Q8H Kalman Shan Ratliff, DO   5,000 Units at 05/08/18 1317  . lidocaine (LIDODERM) 5 % 1 patch  1 patch Transdermal Q24H Valinda Party, DO   1 patch at 05/08/18 1641  . oxyCODONE (Oxy IR/ROXICODONE) immediate release tablet 2.5 mg  2.5 mg Oral Q4H PRN Ina Homes, MD      . pantoprazole (PROTONIX) EC tablet 40 mg  40 mg Oral Daily Hoffman, Jessica Ratliff, DO      . senna-docusate (Senokot-S) tablet 1 tablet  1 tablet Oral QHS PRN Valinda Party, DO        Allergies as of 05/08/2018 - Review Complete 05/08/2018  Allergen Reaction Noted  . Hydrocodone Nausea And Vomiting and Other (See Comments) 12/26/2017  . Tape Other (See Comments) 04/17/2018    Family History  Problem Relation Age of Onset  . Hypertension Mother   . Congestive Heart Failure Mother   . Heart attack Brother 38    Social History   Socioeconomic History  . Marital status: Widowed    Spouse name: Not on file  . Number of children: Not on file  . Years of education: Not on file  . Highest education level: Not on file  Occupational History  . Not on file  Social Needs  . Financial resource strain: Not on file  . Food insecurity:    Worry: Not on file    Inability: Not on file  . Transportation needs:    Medical: Not on file    Non-medical: Not on file  Tobacco Use  . Smoking status: Never Smoker  . Smokeless tobacco: Never Used  Substance and Sexual Activity  . Alcohol use: No    Alcohol/week: 0.0 standard drinks  . Drug use: No    Comment: 08/15/08 UDS + cocaine  . Sexual activity: Not Currently  Lifestyle  . Physical activity:    Days per week: Not on file    Minutes per session: Not  on file  . Stress: Not on file  Relationships  . Social connections:    Talks on phone: Not on file    Gets together: Not on file    Attends religious service: Not on file    Active member of club or organization: Not on file    Attends meetings of clubs or organizations: Not on file    Relationship status: Not on file  . Intimate partner violence:    Fear of current or ex partner: Not on file    Emotionally abused: Not on file    Physically abused: Not on  file    Forced sexual activity: Not on file  Other Topics Concern  . Not on file  Social History Narrative   Married, lives with her husband. 1 child.           Review of Systems: Gen: Denies any fever, chills, sweats, anorexia, admits to fatigue, weakness and malaise HEENT: No visual complaints, No history of Retinopathy. Normal external appearance No Epistaxis or Sore throat. No sinusitis.   CV: Denies chest pain, angina, palpitations, syncope, orthopnea, PND, peripheral edema, and claudication. Resp: Denies dyspnea at rest, dyspnea with exercise, cough, sputum, wheezing, coughing up blood, and pleurisy. GI: Denies vomiting blood, jaundice, and fecal incontinence.   Denies dysphagia or odynophagia. GU : Denies urinary burning, blood in urine, urinary frequency, urinary hesitancy, nocturnal urination, and urinary incontinence.  No renal calculi. MS: Low back pain.  Weakness in lower extremities Derm: Denies rash, itching, dry skin, hives, moles, warts, or unhealing ulcers.  Psych: Denies depression, anxiety, memory loss, suicidal ideation, hallucinations, paranoia, and confusion. Heme: Denies bruising, bleeding, and enlarged lymph nodes. Neuro: No headache.  No diplopia. No dysarthria.  No dysphasia.  No history of CVA.  No Seizures. No paresthesias.  No weakness. Endocrine No DM.  No Thyroid disease.  No Adrenal disease.  Physical Exam: Vital signs in last 24 hours: Temp:  [97.6 F (36.4 C)-97.8 F (36.6 C)] 97.6 F (36.4  C) (11/01 1655) Pulse Rate:  [78-91] 78 (11/01 1655) Resp:  [18-20] 20 (11/01 1655) BP: (134-150)/(56-65) 136/65 (11/01 1655) SpO2:  [90 %-98 %] 97 % (11/01 1655) Last BM Date: 05/08/18 General:   Elderly female no obvious distress.  Not very conversant.  Slightly irritated by conversation Head:  Normocephalic and atraumatic. Eyes:  Sclera clear, no icterus.   Conjunctiva pink. Ears:  Normal auditory acuity. Nose:  No deformity, discharge,  or lesions. Mouth:  No deformity or lesions, dentition normal. Neck:  Supple; no masses or thyromegaly. JVP not elevated Lungs:  Clear throughout to auscultation.   No wheezes, crackles, or rhonchi. No acute distress. Heart: Regular rate and rhythm with 3/6 systolic murmur sternal edge Abdomen:  Soft, nontender and nondistended.  Hypoactive bowel sounds Msk:  Symmetrical without gross deformities. Normal posture. Pulses:  No carotid, renal, femoral bruits. DP and PT symmetrical and equal Extremities:  Without clubbing or edema.  Left AV fistula thrill and bruit Neurologic:  Alert and  oriented x4;  grossly normal neurologically. Skin:  Intact without significant lesions or rashes.   Intake/Output from previous day: No intake/output data recorded. Intake/Output this shift: Total I/O In: 360 [P.O.:360] Out: -   Lab Results: Recent Labs    05/06/18 2208 05/08/18 1246  WBC 10.6* 9.0  HGB 10.5* 8.9*  HCT 34.7* 30.3*  PLT 245 223   BMET Recent Labs    05/06/18 2208 05/08/18 1246  NA 136 137  K 4.1 4.1  CL 98 100  CO2 27 25  GLUCOSE 86 102*  BUN 37* 50*  CREATININE 7.07* 8.96*  CALCIUM 9.9 9.3   LFT No results for input(s): PROT, ALBUMIN, AST, ALT, ALKPHOS, BILITOT, BILIDIR, IBILI in the last 72 hours. PT/INR No results for input(s): LABPROT, INR in the last 72 hours. Hepatitis Panel No results for input(s): HEPBSAG, HCVAB, HEPAIGM, HEPBIGM in the last 72 hours.  Studies/Results: Dg Chest 1 View  Result Date:  05/06/2018 CLINICAL DATA:  Pain EXAM: CHEST  1 VIEW COMPARISON:  04/22/2018 FINDINGS: Stable cardiomegaly with aortic atherosclerosis. Slight interstitial edema.  Interval removal of dialysis catheter. No pneumothorax. No overt pulmonary edema, effusion or pneumothorax. Skin fold artifact projects over the periphery of the left base. IMPRESSION: 1. Cardiomegaly with minimal interstitial edema. Stable moderate aortic atherosclerosis. 2. Interval removal of dialysis catheter.  No complicating features. Electronically Signed   By: Ashley Royalty M.D.   On: 05/06/2018 23:19   Ct Head Wo Contrast  Result Date: 05/06/2018 CLINICAL DATA:  Head trauma.  Fall from bed. EXAM: CT HEAD WITHOUT CONTRAST CT CERVICAL SPINE WITHOUT CONTRAST TECHNIQUE: Multidetector CT imaging of the head and cervical spine was performed following the standard protocol without intravenous contrast. Multiplanar CT image reconstructions of the cervical spine were also generated. COMPARISON:  Head CT 04/22/2018 FINDINGS: CT HEAD FINDINGS Brain: There is no mass, hemorrhage or extra-axial collection. The size and configuration of the ventricles and extra-axial CSF spaces are normal. There is an old left cerebellar infarct. There is periventricular hypoattenuation compatible with chronic microvascular disease. Vascular: Atherosclerotic calcification of the internal carotid arteries at the skull base. No abnormal hyperdensity of the major intracranial arteries or dural venous sinuses. Skull: The visualized skull base, calvarium and extracranial soft tissues are normal. Sinuses/Orbits: No fluid levels or advanced mucosal thickening of the visualized paranasal sinuses. No mastoid or middle ear effusion. The orbits are normal. CT CERVICAL SPINE FINDINGS Alignment: No static subluxation. Facets are aligned. Occipital condyles are normally positioned. Skull base and vertebrae: No acute fracture. Soft tissues and spinal canal: No prevertebral fluid or  swelling. No visible canal hematoma. Disc levels: No advanced spinal canal or neural foraminal stenosis. Upper chest: No pneumothorax, pulmonary nodule or pleural effusion. Other: Normal visualized paraspinal cervical soft tissues. IMPRESSION: 1. No acute abnormality of the brain or cervical spine. 2. Old left cerebellar infarct and findings of chronic small vessel ischemia. Electronically Signed   By: Ulyses Jarred M.D.   On: 05/06/2018 22:53   Ct Cervical Spine Wo Contrast  Result Date: 05/06/2018 CLINICAL DATA:  Head trauma.  Fall from bed. EXAM: CT HEAD WITHOUT CONTRAST CT CERVICAL SPINE WITHOUT CONTRAST TECHNIQUE: Multidetector CT imaging of the head and cervical spine was performed following the standard protocol without intravenous contrast. Multiplanar CT image reconstructions of the cervical spine were also generated. COMPARISON:  Head CT 04/22/2018 FINDINGS: CT HEAD FINDINGS Brain: There is no mass, hemorrhage or extra-axial collection. The size and configuration of the ventricles and extra-axial CSF spaces are normal. There is an old left cerebellar infarct. There is periventricular hypoattenuation compatible with chronic microvascular disease. Vascular: Atherosclerotic calcification of the internal carotid arteries at the skull base. No abnormal hyperdensity of the major intracranial arteries or dural venous sinuses. Skull: The visualized skull base, calvarium and extracranial soft tissues are normal. Sinuses/Orbits: No fluid levels or advanced mucosal thickening of the visualized paranasal sinuses. No mastoid or middle ear effusion. The orbits are normal. CT CERVICAL SPINE FINDINGS Alignment: No static subluxation. Facets are aligned. Occipital condyles are normally positioned. Skull base and vertebrae: No acute fracture. Soft tissues and spinal canal: No prevertebral fluid or swelling. No visible canal hematoma. Disc levels: No advanced spinal canal or neural foraminal stenosis. Upper chest: No  pneumothorax, pulmonary nodule or pleural effusion. Other: Normal visualized paraspinal cervical soft tissues. IMPRESSION: 1. No acute abnormality of the brain or cervical spine. 2. Old left cerebellar infarct and findings of chronic small vessel ischemia. Electronically Signed   By: Ulyses Jarred M.D.   On: 05/06/2018 22:53   Ct Lumbar Spine Wo Contrast  Result Date: 05/08/2018 CLINICAL DATA:  Fall 2 days ago in bath tub. EXAM: CT LUMBAR SPINE WITHOUT CONTRAST TECHNIQUE: Multidetector CT imaging of the lumbar spine was performed without intravenous contrast administration. Multiplanar CT image reconstructions were also generated. COMPARISON:  CT abdomen pelvis 01/25/2018 FINDINGS: Segmentation: Normal Alignment: Normal Vertebrae: There is no acute fracture or osseous erosion. Paraspinal and other soft tissues: Calcific aortic atherosclerosis. Heterogeneously Dense area at the right renal pelvis measuring 2.8 x 2.1 cm with intermediate density. This is new compared to the CT abdomen and pelvis of 01/25/2018. bilateral pleural effusions. Disc levels: No bony spinal canal stenosis. No high-grade foraminal stenosis. No traumatic disc herniation. IMPRESSION: 1. Intermediate density mass of the right upper pole kidney, measuring 2.8 x 2.1 cm. Dedicated abdominal imaging with CT of the abdomen and pelvis with contrast or MRI of the abdomen with and without contrast is recommended, as this appearance is compatible with renal cell carcinoma. 2. Bilateral pleural effusions. 3. No acute fracture or static subluxation of the lumbar spine or visualized lower thoracic spine. Electronically Signed   By: Ulyses Jarred M.D.   On: 05/08/2018 15:14   Dg Knee Complete 4 Views Right  Result Date: 05/06/2018 CLINICAL DATA:  Knee pain EXAM: RIGHT KNEE - COMPLETE 4+ VIEW COMPARISON:  02/17/2017 FINDINGS: Moderate advanced tricompartmental osteoarthritis of the knee without significant interval change. Extensive vascular  calcifications are noted along the course of the femoral through tibial arteries. No acute fracture, significant joint effusion, malalignment nor bone destruction. IMPRESSION: Advanced tricompartmental osteoarthritis of the knee without significant change. No acute fracture. Electronically Signed   By: Ashley Royalty M.D.   On: 05/06/2018 23:07   Dg Hip Unilat With Pelvis 2-3 Views Right  Result Date: 05/06/2018 CLINICAL DATA:  Right hip pain. EXAM: DG HIP (WITH OR WITHOUT PELVIS) 2-3V RIGHT COMPARISON:  03/08/2018 FINDINGS: A vertical lucency is seen through the inferior right pubic ramus which changes in position between the pelvis and AP view of the right hip consistent with artifact, likely from overlying soft tissues. There is lower lumbar degenerative facet arthropathy. Calcified uterine fibroid projects over the mid pelvis. No pelvic diastasis is identified. Maintained right hip joint without proximal femoral fracture. Iliofemoral arteriosclerosis is identified. IMPRESSION: 1. Lower lumbar facet arthropathy. 2. Calcified uterine fibroid. 3. No acute fracture joint dislocation of the right hip. If the patient has pain out of proportion to radiographic findings or clinical suspicion remains high for radiographically occult fracture, CT would be the study of choice. Electronically Signed   By: Ashley Royalty M.D.   On: 05/06/2018 23:13   Unit: [place] [phone]) Schedule: MWF Access: AVF Time: 4h Qb:300  Qd: 600  Dialyzer: F180  Dry weight: 59kg  Bath: 3/2.5  Heparin: none Meds:  Calcitriol: 1.75 mcg qtx ESA: mircera 23mcg q 2wks    Assessment/Plan:  Low back pain CT scan lumbar spine without contrast.  Intermediate low-density mass right upper pole kidney measuring 2.8 x 2.1 cm recommended abdominal imaging with contrast there is compatible with renal cell carcinoma bilateral pleural effusions.  No evidence of fracture of lumbar spine or lower thoracic  vertebra  End-stage renal disease we will dialyze today is on Monday Wednesday Friday schedule.  Has received dialysis at [facility].  Anemia appears to have slightly more anemic.  Will give dose of Epogen today.  Will check iron studies  Bones continue calcitriol 1.75 mcg q. treatment.  We will continue to follow up  on bone mineral parameters.  She was taking Tums as an outpatient as a binder will follow  Hypertension/volume has some pleural effusions but appears clinically to be euvolemic minimal fluid removal today  Aortic stenosis last 2D echo with bubble study performed 10/01/7122, she has diastolic dysfunction ejection fraction 60 to 65%.  Moderate aortic stenosis with a valve area mean of 1.35 cm  Renal cell mass diagnosed on lumbar spine CT scan.  Abdominal CT to follow.  Will need to involve urology for further assistance of management.  This may need to be accomplished in the outpatient setting.  Noncompliance.  Posing significant barrier to care will need to involve care manager and social worker.  Consider psychiatric input.  Nutrition albumin 3.0 add pro-stat supplements, Nepro.    LOS: [place] @TODAY @6 :58 PM

## 2018-05-08 NOTE — Progress Notes (Addendum)
   CC: confusion  HPI:  Nancy [name] is a 77 y.o. with PMH as listed below who presents for confusion. Please see the assessment and plans for the status of the patient chronic medical problems.   Past Medical History:  Diagnosis Date  . Anemia   . Aortic stenosis   . Bacterial sinusitis 09/17/2011  . CHF (congestive heart failure) ([place])   . CKD (chronic kidney disease) stage 4, GFR 15-29 ml/min ([place]) 08/11/2006   Cr continues to increase. Proteinuria on UA 02/10/12.    . Colitis   . CVA (cerebrovascular accident) [facility])    New hemorrhagic per CT scan '09  . Diverticulosis of colon   . Dysfunctional uterine bleeding   . ESRD (end stage renal disease) on dialysis ([place])    "MWF; [facility]" (11/27/2017)  . Fecal impaction ([place])   . Headache(784.0)   . Heart murmur   . HERNIORRHAPHY, HX OF 08/11/2006  . Hypertension   . OA (osteoarthritis)    bilateral knees  . Postmenopausal   . Pulmonary nodule   . TINEA CRURIS 01/12/2007   Review of Systems:  Refer to history of present illness and assessment and plans for pertinent review of systems, all others reviewed and negative  Physical Exam:  Vitals:   05/08/18 1047  BP: (!) 150/63  Pulse: 91  Temp: 97.6 F (36.4 C)  TempSrc: Oral  SpO2: 90%  Height: 5\' 3"  (1.6 m)   General: no acute distress  Cardiac: murmur present over all cardiac fields, trace edema of the left lower extremity, left arm AV fistula is warm and has a palpable thrill Pulm: normal work of breathing, lungs sound clear to auscultation MSK: No deformity evident over the cervical or lumbar spine, there is some left lumbar paraspinal tenderness Neuro: oriented to person, time and place  Assessment & Plan:   ESRD  Nancy [name] presents for follow up after recent ED visit for a mechanical fall after she slipped while getting out of the bathtub two days ago. She has gone one week without HD. Her granddaughter brings her to clinic today with concern for confusion and  aggressive behavior at home.  She is at risk to herself at home, she has had multiple falls and is refusing help from her family at home. [facility] has been involved and has assigned a case manager, they are aware that Nancy [name] family would like for her to be placed in an assisted living facility and that her family feels they are not able to provide the care that she needs. Her continuity provider was present in the clinic and he feels she is not at her baseline self which is pleasant and cooperative despite being oriented.  - will admit to the inpatient team for HD  Lumbar paraspinal pain  Patient's chief complaint is paraspinal lumbar pain. She is afebrile and has no midline back pain. She had a CT scan of the cervical spine earlier this week when she presented to the ED after a fall, I have reviewed the cervical spine CT scan and see no acute fracture. I do not see an indication for imaging of the lumbar spine at this time.   See Encounters Tab for problem based charting.  Patient discussed with Dr. Dareen Piano

## 2018-05-08 NOTE — Assessment & Plan Note (Signed)
Nancy [name] presents for follow up after recent ED visit for a mechanical fall after she slipped while getting out of the bathtub two days ago. She has gone one week without HD. Her granddaughter brings her to clinic today with concern for confusion and aggressive behavior at home.  She is at risk to herself at home, she has had multiple falls and is refusing help from her family at home. [facility] has been involved and has assigned a case manager, they are aware that Nancy [name] family would like for her to be placed in an assisted living facility and that her family feels they are not able to provide the care that she needs. Her continuity provider was present in the clinic and he feels she is not at her baseline self which is pleasant and cooperative despite being oriented.  - will admit to the inpatient team for HD

## 2018-05-09 ENCOUNTER — Encounter (HOSPITAL_COMMUNITY): Payer: Self-pay | Admitting: *Deleted

## 2018-05-09 DIAGNOSIS — Z9115 Patient's noncompliance with renal dialysis: Secondary | ICD-10-CM

## 2018-05-09 DIAGNOSIS — G8929 Other chronic pain: Secondary | ICD-10-CM

## 2018-05-09 DIAGNOSIS — Z885 Allergy status to narcotic agent status: Secondary | ICD-10-CM

## 2018-05-09 DIAGNOSIS — R011 Cardiac murmur, unspecified: Secondary | ICD-10-CM

## 2018-05-09 DIAGNOSIS — Z79899 Other long term (current) drug therapy: Secondary | ICD-10-CM

## 2018-05-09 DIAGNOSIS — I5032 Chronic diastolic (congestive) heart failure: Secondary | ICD-10-CM

## 2018-05-09 DIAGNOSIS — N2889 Other specified disorders of kidney and ureter: Secondary | ICD-10-CM | POA: Diagnosis present

## 2018-05-09 DIAGNOSIS — K219 Gastro-esophageal reflux disease without esophagitis: Secondary | ICD-10-CM

## 2018-05-09 DIAGNOSIS — I132 Hypertensive heart and chronic kidney disease with heart failure and with stage 5 chronic kidney disease, or end stage renal disease: Secondary | ICD-10-CM

## 2018-05-09 DIAGNOSIS — Z8673 Personal history of transient ischemic attack (TIA), and cerebral infarction without residual deficits: Secondary | ICD-10-CM

## 2018-05-09 DIAGNOSIS — N289 Disorder of kidney and ureter, unspecified: Secondary | ICD-10-CM

## 2018-05-09 DIAGNOSIS — Z91048 Other nonmedicinal substance allergy status: Secondary | ICD-10-CM

## 2018-05-09 DIAGNOSIS — Z9181 History of falling: Secondary | ICD-10-CM

## 2018-05-09 DIAGNOSIS — Z66 Do not resuscitate: Secondary | ICD-10-CM

## 2018-05-09 DIAGNOSIS — R531 Weakness: Secondary | ICD-10-CM

## 2018-05-09 DIAGNOSIS — Z992 Dependence on renal dialysis: Secondary | ICD-10-CM

## 2018-05-09 LAB — RENAL FUNCTION PANEL
ANION GAP: 13 (ref 5–15)
Albumin: 2.3 g/dL — ABNORMAL LOW (ref 3.5–5.0)
BUN: 62 mg/dL — ABNORMAL HIGH (ref 8–23)
CHLORIDE: 100 mmol/L (ref 98–111)
CO2: 23 mmol/L (ref 22–32)
CREATININE: 9.79 mg/dL — AB (ref 0.44–1.00)
Calcium: 8.9 mg/dL (ref 8.9–10.3)
GFR, EST AFRICAN AMERICAN: 4 mL/min — AB (ref >60)
GFR, EST NON AFRICAN AMERICAN: 3 mL/min — AB (ref >60)
Glucose, Bld: 90 mg/dL (ref 70–99)
Phosphorus: 7.1 mg/dL — ABNORMAL HIGH (ref 2.5–4.6)
Potassium: 4 mmol/L (ref 3.5–5.1)
Sodium: 136 mmol/L (ref 135–145)

## 2018-05-09 LAB — CBC
HEMATOCRIT: 32 % — AB (ref 36.0–46.0)
Hemoglobin: 9.4 g/dL — ABNORMAL LOW (ref 12.0–15.0)
MCH: 27.3 pg (ref 26.0–34.0)
MCHC: 29.4 g/dL — ABNORMAL LOW (ref 30.0–36.0)
MCV: 93 fL (ref 80.0–100.0)
NRBC: 0 % (ref 0.0–0.2)
PLATELETS: 229 10*3/uL (ref 150–400)
RBC: 3.44 MIL/uL — AB (ref 3.87–5.11)
RDW: 17.6 % — AB (ref 11.5–15.5)
WBC: 9.5 10*3/uL (ref 4.0–10.5)

## 2018-05-09 MED ORDER — SODIUM CHLORIDE 0.9 % IV SOLN: 100.0000 mL | INTRAVENOUS | Status: DC | PRN

## 2018-05-09 MED ORDER — HEPARIN SODIUM (PORCINE) 1000 UNIT/ML DIALYSIS: 1000.0000 [IU] | INTRAMUSCULAR | Status: DC | PRN

## 2018-05-09 MED ORDER — ALTEPLASE 2 MG IJ SOLR: 2.0000 mg | Freq: Once | INTRAMUSCULAR | Status: DC | PRN

## 2018-05-09 MED ORDER — HALOPERIDOL LACTATE 5 MG/ML IJ SOLN
5.0000 mg | Freq: Once | INTRAMUSCULAR | Status: AC
  Administered 2018-05-09: 5 mg via INTRAMUSCULAR
  Filled 2018-05-09: qty 1

## 2018-05-09 MED ORDER — PENTAFLUOROPROP-TETRAFLUOROETH EX AERO: 1.0000 "application " | INHALATION_SPRAY | CUTANEOUS | Status: DC | PRN

## 2018-05-09 MED ORDER — HALOPERIDOL 5 MG PO TABS
5.0000 mg | ORAL_TABLET | Freq: Once | ORAL | Status: AC
  Filled 2018-05-09: qty 1

## 2018-05-09 MED ORDER — LIDOCAINE HCL (PF) 1 % IJ SOLN: 5.0000 mL | INTRAMUSCULAR | Status: DC | PRN

## 2018-05-09 MED ORDER — LIDOCAINE-PRILOCAINE 2.5-2.5 % EX CREA: 1.0000 "application " | TOPICAL_CREAM | CUTANEOUS | Status: DC | PRN

## 2018-05-09 MED ORDER — DARBEPOETIN ALFA 200 MCG/0.4ML IJ SOSY
200.0000 ug | PREFILLED_SYRINGE | INTRAMUSCULAR | Status: DC
  Administered 2018-05-11: 200 ug via INTRAVENOUS
  Filled 2018-05-09: qty 0.4

## 2018-05-09 MED ORDER — CHLORHEXIDINE GLUCONATE CLOTH 2 % EX PADS: 6.0000 | MEDICATED_PAD | Freq: Every day | CUTANEOUS | Status: DC

## 2018-05-09 NOTE — Progress Notes (Signed)
MD --Spoke to granddaughter [name] and she asked how Dialysis went today after receiving one time dose Haldol.  Family requesting that pt receive Haldol to assist her in her resistance in going to Dialysis as it was observed to help.

## 2018-05-09 NOTE — Procedures (Signed)
Patient arrived to unit @ 0845.  Transporter reported that she took her gown off and was exposing self on transport to hemo unit.  Patient adamantly refused hemodialysis treatment.  She stated "what day is this?  It is not Monday, so I am not doing it!"  She also stated "You are not putting no needles in my arm, no you are not."  Alert, oriented to self only.  Amalia Hailey, PA-c to bedside to evaluate patient.  Decision made by PA to return patient to her room at this time.

## 2018-05-09 NOTE — Progress Notes (Signed)
   05/09/18 0556  Provider Notification  Provider Name/Title Dr. Eileen Stanford  Date Provider Notified 05/09/18  Time Provider Notified [phone]  Notification Type Page  Notification Reason Other (Comment) (Patient refusing heaparin injections)   MD texted and made aware.  Will continue to monitor patient.  Earleen Reaper RN-BC, [employer]

## 2018-05-09 NOTE — Progress Notes (Signed)
   05/09/18 0131  Provider Notification  Provider Name/Title Dr. Eileen Stanford  Date Provider Notified 05/09/18  Time Provider Notified [phone]  Notification Type Page  Notification Reason Other (Comment) (Informed that she refused HD)  Response No new orders   Patient screaming she did not want to go to hemodialysis.  Tried to educate on the importance of going now.  Patient still refusing.  States she will go in the morning.  Asked hemodialysis to try again first rounds.  Notified primary via page of the above.  Will continue to monitor patient.  Earleen Reaper RN-BC, [employer]

## 2018-05-09 NOTE — Progress Notes (Signed)
Any medical provider can do capacity, psych consult not warranted.  Training was in place for providers.  Waylan Boga, PMHNP

## 2018-05-09 NOTE — Progress Notes (Signed)
Received call from primary nurse Fredrich Romans, RN stating that pt is agitated and refusing HD tx for tonight stating she will do it tomorrow (Saturday). Will transition this information to dayshift CN to try and get pt first rounds

## 2018-05-09 NOTE — Progress Notes (Addendum)
Internal Medicine Attending:   I saw and examined the patient. I reviewed the Dr Jerrell Mylar note and I agree with the resident's findings and plan as documented in the resident's note with the following additions Please also see my attestation of H&P for additional details. Patient declined hemodialysis this morning there is a question of her capacity.  Evaluation she is oriented x4.  Can understand indications of hemodialysis and consequences of missed hemodialysis.  She has previous history of not wanting to be started on hemodialysis well-documented in our chart.  In my conversation with her this morning however she notes that she did not get hemodialysis because she had soiled herself and she wanted a bath first.  She reports to me that she is willing to go for hemodialysis if taken back this afternoon.   I agree with palliative care consult. She does currently have capacity to make medical decisions. If she continues to refuse hemodialysis she would likely need a ethics consult.  We may still need a psychiatry consult not necessarily for capacity but to see if she has underlying psychiatric conditions that contribute to her nonadherence with hemodialysis.

## 2018-05-09 NOTE — Progress Notes (Addendum)
I have seen and examined this patient and agree with the plan of care , combative and confused on dialysis. Taken off treatment and sent back to room. Will try again after Haldol. Will await clarity once established POA and consult with palliative medicine  Sherril Croon 05/09/2018, 2:27 PM  [facility] Progress Note   Dialysis Orders: MWF [facility] 4 hr 300/600 - noncompliant - never ever runs full treatment and misses multiple treatments EDW 59 3 K 2.5 Ca no heparin Calcitriol 1.75 Mircera 200 q 2 weeks venofer 50 per week  Assessment/Plan: 1. Altered mental status -- very argumentative today brought to dialysis and refuses to dialyze; multiple ED visits and recent [facility] admission where she declined SNF . Has baseline dementia - confusion escalated.  Agree with palliative care consult but also need psych consult for capacity. 2. ESRD - MWF - K 4.1 Friday - no labs yet today- apparent last dialysis 10/27 (Sunday)  - no acute need for dialysis- noncompliance with outpatient HD - hold dialysis today - re-eval in the am. 3. Anemia - hgb 8.9 - last outpatient Mircera 200 10/14 - redose Monday, if she elects to dialyze 4. Secondary hyperparathyroidism - calcitriol if she dialyzes 5. HTN/volume - review of outpatient records indicate she has been below edw for some time - post wt 55.1 10/21 which was the last time she had outpatient HD- does have bilateral pleural effusions 6. Nutrition - regular diet 7. Low back pain - probably newly dc renal cell right renal pelvis - noted on lumbar CT  Myriam Jacobson, PA-C [place] [phone] 05/09/2018,8:53 AM  LOS: 1 day   Subjective:   threatening to call the police. Refuses to answer questions. Refusing HD after being brought to the dialysis unit. Per nursing, she took her bra off in the elevator on the way to dialysis.  Objective Vitals:   05/08/18 1200 05/08/18 1655 05/08/18 1957 05/09/18 0514  BP: (!) 134/56 136/65  132/63 131/84  Pulse: 86 78 78 81  Resp: 18 20 18 18   Temp: 97.8 F (36.6 C) 97.6 F (36.4 C) 98 F (36.7 C) 97.9 F (36.6 C)  TempSrc: Oral Oral  Oral  SpO2: 98% 97% 100% 98%   Physical Exam General: refusing exam - slightly combative with attempt to exam - crosses arms across self Heart: Lungs: Abdomen: Extremities: Dialysis Access: [facility]   Additional Objective Labs: Basic Metabolic Panel: Recent Labs  Lab 05/06/18 2208 05/08/18 1246  NA 136 137  K 4.1 4.1  CL 98 100  CO2 27 25  GLUCOSE 86 102*  BUN 37* 50*  CREATININE 7.07* 8.96*  CALCIUM 9.9 9.3   Liver Function Tests: No results for input(s): AST, ALT, ALKPHOS, BILITOT, PROT, ALBUMIN in the last 168 hours. No results for input(s): LIPASE, AMYLASE in the last 168 hours. CBC: Recent Labs  Lab 05/06/18 2208 05/08/18 1246  WBC 10.6* 9.0  NEUTROABS 8.6*  --   HGB 10.5* 8.9*  HCT 34.7* 30.3*  MCV 93.0 92.1  PLT 245 223   Blood Culture    Component Value Date/Time   SDES BLOOD RIGHT WRIST 04/22/2018 1800   SPECREQUEST  04/22/2018 1800    BOTTLES DRAWN AEROBIC ONLY Blood Culture adequate volume   CULT  04/22/2018 1800    NO GROWTH 5 DAYS Performed at [facility], [place] [address], [place], [place] [address]    REPTSTATUS 04/27/2018 FINAL 04/22/2018 1800    Cardiac Enzymes: No  results for input(s): CKTOTAL, CKMB, CKMBINDEX, TROPONINI in the last 168 hours. CBG: No results for input(s): GLUCAP in the last 168 hours. Iron Studies: No results for input(s): IRON, TIBC, TRANSFERRIN, FERRITIN in the last 72 hours. Lab Results  Component Value Date   INR 1.13 03/29/2018   INR 1.03 03/06/2018   INR 1.30 02/17/2017   Studies/Results: Ct Lumbar Spine Wo Contrast  Result Date: 05/08/2018 CLINICAL DATA:  Fall 2 days ago in bath tub. EXAM: CT LUMBAR SPINE WITHOUT CONTRAST TECHNIQUE: Multidetector CT imaging of the lumbar spine was performed without intravenous contrast administration. Multiplanar CT  image reconstructions were also generated. COMPARISON:  CT abdomen pelvis 01/25/2018 FINDINGS: Segmentation: Normal Alignment: Normal Vertebrae: There is no acute fracture or osseous erosion. Paraspinal and other soft tissues: Calcific aortic atherosclerosis. Heterogeneously Dense area at the right renal pelvis measuring 2.8 x 2.1 cm with intermediate density. This is new compared to the CT abdomen and pelvis of 01/25/2018. bilateral pleural effusions. Disc levels: No bony spinal canal stenosis. No high-grade foraminal stenosis. No traumatic disc herniation. IMPRESSION: 1. Intermediate density mass of the right upper pole kidney, measuring 2.8 x 2.1 cm. Dedicated abdominal imaging with CT of the abdomen and pelvis with contrast or MRI of the abdomen with and without contrast is recommended, as this appearance is compatible with renal cell carcinoma. 2. Bilateral pleural effusions. 3. No acute fracture or static subluxation of the lumbar spine or visualized lower thoracic spine. Electronically Signed   By: Ulyses Jarred M.D.   On: 05/08/2018 15:14   Medications: . sodium chloride    . sodium chloride     . [START ON 05/11/2018] calcitRIOL  1.75 mcg Oral Q M,W,F-HD  . Chlorhexidine Gluconate Cloth  6 each Topical Q0600  . darbepoetin (ARANESP) injection - DIALYSIS  150 mcg Intravenous Q Fri-HD  . feeding supplement (NEPRO CARB STEADY)  237 mL Oral Q24H  . feeding supplement (PRO-STAT SUGAR FREE 64)  30 mL Oral BID  . heparin  1,800 Units Dialysis Once in dialysis  . heparin  5,000 Units Subcutaneous Q8H  . lidocaine  1 patch Transdermal Q24H  . pantoprazole  40 mg Oral Daily

## 2018-05-09 NOTE — Procedures (Deleted)
Patient refused to come for HD trmt again this am.  Tharon Aquas, PA-c notified.  Will attempt to dialyze patient later today.

## 2018-05-09 NOTE — Progress Notes (Signed)
   Subjective: Patient doing well this AM and her back pain is manageable. She was able to get some rest overnight. She declined HD overnight and her VTE ppx. Discussed that she needs HD, CT abdomen, and IV access. She is willing but states that she wants to eat and get a little more rest. She otherwise has no questions or concerns today.   Objective: Vital signs in last 24 hours: Vitals:   05/08/18 1200 05/08/18 1655 05/08/18 1957 05/09/18 0514  BP: (!) 134/56 136/65 132/63 131/84  Pulse: 86 78 78 81  Resp: 18 20 18 18   Temp: 97.8 F (36.6 C) 97.6 F (36.4 C) 98 F (36.7 C) 97.9 F (36.6 C)  TempSrc: Oral Oral  Oral  SpO2: 98% 97% 100% 98%   General: Well nourished female in no acute distress Pulm: Diminished bibasilar air movement  CV: RRR, crescendo-decrescendo systolic murmur Abdomen: Active bowel sounds, soft, non-distended, no tenderness to palpation  Extremities: No LE edema, palpable thrill on left upper extremity   Assessment/Plan:  [place] Bolt is a 77 y.o female with ESRD and HFpEF who presented with acute on chronic lumbar back pain that was affecting her ability, and her family's ability, to care for herself. She has been evaluated 8 times in the past 30 days for this problem and was subsequently admitted for further evaluation and pain management.   Patient continues to decline medical services and be verbal abusive towards staff. She appears to have capacity to make her own decisions at this point and continues to be reluctant to anything but symptom management. Upon chart review and discussion with prior providers it appears there is a discrepancy in goals of care. She was last seen by palliative care on 01/30/2018. At that point the patient elected to transition to a DNR and continue HD. It may be of benefit to have palliative care again see the patient to determine if her goals of care have changed.   Bilateral Lumbar Back Pain  - Pain in the lumbar region that  affects the patient's ability to preform her ADLs. Denies radicular symptoms  - CT lumbar spine without acute fractures, disc pathology, or vertebral pathology but did illustrate a right renal mass. However, pain seems for MSK in etiology as opposed to visceral.  - Consult PT/OT for further evaluation and recommendation - Pain management with lidocaine patch and oxycodone 2.5 mg every 4 hours PRN  Right Renal Mass - CT abdomen with "Intermediate density mass of the right upper pole kidney, measuring 2.8 x 2.1 cm." - Getting CT abdomen with contrast to better assess - May need urology, outpatient vs inpatient   ESRD on HD - Unable to get HD since Sunday at [facility] without any acute indications for HD - Appears euvolemic but does have bilateral pleural effusions  - Patient declined HD last night, hopefully will get it today - Nephrology onboard, appreciate their recommendations  Diet: Renal  VTE ppx: Patient declining SubQ Heparin, SCDs ordered  CODE STATUS: DNR  Dispo: Anticipated discharge in approximately 3-4 day(s).   Ina Homes, MD 05/09/2018, 8:21 AM

## 2018-05-09 NOTE — Progress Notes (Signed)
Patient continues to yell out, is verbally and physically abusive to staff, and is refusing care. Security called and at bedside. MD made aware. MD at patients bedside with patient and granddaughter. Awaiting orders. Will continue to monitor.

## 2018-05-09 NOTE — Evaluation (Signed)
Physical Therapy Evaluation Patient Details Name: Nancy [name] MRN: [id number] DOB: 02-28-1941 Today's Date: 05/09/2018   History of Present Illness  This is a 77 year old lady with a history of end-stage renal disease MWF HD, CVA hypertension and congestive heart failure with diastolic dysfunction, aortic stenosis.  She has been complaining of lower back pain leg weakness and presented to the internal medicine clinic, subsequently admitted to [facility]  She was seen in [facility] emergency room for evaluation of back pain and underwent imaging that was negative for fracture.  Clinical Impression  Pt admitted with above diagnosis. Pt currently with functional limitations due to the deficits listed below (see PT Problem List). At the time of PT eval pt was agitated. Overall pt reports severe pain in her back but actually moved fairly well. It appeared that the assistance that was required was because pt not putting forth full effort. At this time, not sure of PLOF or home situation - but feel pt will require 24 hour support at d/c. Also not sure about baseline cognition, however based on today's agitation do not feel she would be safe alone at home. Pt will benefit from skilled PT to increase their independence and safety with mobility to allow discharge to the venue listed below.       Follow Up Recommendations SNF - 24 hour assistance/supervision    Equipment Recommendations  None recommended by PT    Recommendations for Other Services       Precautions / Restrictions Precautions Precautions: Fall Precaution Comments: Attempted to educate on back precautions for comfort but unsuccessful.  Restrictions Weight Bearing Restrictions: No      Mobility  Bed Mobility Overal bed mobility: Needs Assistance Bed Mobility: Supine to Sit     Supine to sit: Min assist     General bed mobility comments: Min assist with bed pad to transition fully to EOB, and assist provided at the  trunk for elevation to full sitting position. Pt reporting "I can't do anything" but she was actually able to complete 75% of the transfer without assistance.   Transfers Overall transfer level: Needs assistance Equipment used: Rolling walker (2 wheeled) Transfers: Sit to/from Stand Sit to Stand: Mod assist;Min assist         General transfer comment: VC's for hand placement on seated surface for safety. Mod assist required from low bed height, and min assist required from recliner chair to achieve full standing position.   Ambulation/Gait Ambulation/Gait assistance: Min guard Gait Distance (Feet): 5 Feet Assistive device: Rolling walker (2 wheeled) Gait Pattern/deviations: Step-through pattern;Decreased stride length;Trunk flexed;Narrow base of support Gait velocity: Decreased Gait velocity interpretation: <1.31 ft/sec, indicative of household ambulator General Gait Details: Pt was able to take a few steps from bed to chair (a max of 5'). She managed the RW well and no noted knee buckling or LOB throughout.   Stairs            Wheelchair Mobility    Modified Rankin (Stroke Patients Only)       Balance Overall balance assessment: Needs assistance Sitting-balance support: Feet supported;No upper extremity supported Sitting balance-Leahy Scale: Fair     Standing balance support: Bilateral upper extremity supported;During functional activity Standing balance-Leahy Scale: Fair Standing balance comment: Pt holding to RW but did not appear to be reliant on it. She was able to wipe her own back side with a washcloth without assistance or any extra lateral sway noted.  Pertinent Vitals/Pain Pain Assessment: Faces Faces Pain Scale: Hurts a little bit Pain Location: Pt reporting severe back pain, however she does not appear to be in much pain at all when mobilizing.  Pain Descriptors / Indicators: Discomfort Pain Intervention(s):  Monitored during session;Repositioned    Home Living Family/patient expects to be discharged to:: Private residence Living Arrangements: Alone Available Help at Discharge: Family;Personal care attendant;Other (Comment) Type of Home: Apartment Home Access: Level entry     Home Layout: One level Home Equipment: Cane - single point;Grab bars - tub/shower;Walker - 2 wheels Additional Comments: Above information was taken from a previous admission in June. Unsure if all is still accurate. Pt not willing to answer questions.     Prior Function Level of Independence: Independent with assistive device(s)         Comments: Pt reports she was using a walker at home.      Hand Dominance   Dominant Hand: Right    Extremity/Trunk Assessment   Upper Extremity Assessment Upper Extremity Assessment: Defer to OT evaluation    Lower Extremity Assessment Lower Extremity Assessment: Generalized weakness(Difficult to assess as pt not participating in MMT)    Cervical / Trunk Assessment Cervical / Trunk Assessment: Kyphotic  Communication   Communication: No difficulties  Cognition Arousal/Alertness: Awake/alert Behavior During Therapy: Agitated Overall Cognitive Status: Difficult to assess due to agitation    General Comments      Exercises     Assessment/Plan    PT Assessment Patient needs continued PT services  PT Problem List Decreased strength;Decreased activity tolerance;Decreased balance;Decreased mobility;Decreased knowledge of use of DME;Decreased cognition;Decreased safety awareness;Decreased knowledge of precautions;Pain       PT Treatment Interventions DME instruction;Gait training;Stair training;Functional mobility training;Therapeutic activities;Therapeutic exercise;Neuromuscular re-education;Patient/family education    PT Goals (Current goals can be found in the Care Plan section)  Acute Rehab PT Goals Patient Stated Goal: None stated PT Goal Formulation:  Patient unable to participate in goal setting Time For Goal Achievement: 05/23/18 Potential to Achieve Goals: Good    Frequency Min 2X/week   Barriers to discharge        Co-evaluation               AM-PAC PT "6 Clicks" Daily Activity  Outcome Measure Difficulty turning over in bed (including adjusting bedclothes, sheets and blankets)?: Unable Difficulty moving from lying on back to sitting on the side of the bed? : Unable Difficulty sitting down on and standing up from a chair with arms (e.g., wheelchair, bedside commode, etc,.)?: Unable Help needed moving to and from a bed to chair (including a wheelchair)?: A Little Help needed walking in hospital room?: A Little Help needed climbing 3-5 steps with a railing? : A Lot 6 Click Score: 11    End of Session Equipment Utilized During Treatment: Gait belt Activity Tolerance: Treatment limited secondary to agitation Patient left: in chair;with call bell/phone within reach;with chair alarm set Nurse Communication: Mobility status(RN present throughout session) PT Visit Diagnosis: Pain;Difficulty in walking, not elsewhere classified (R26.2) Pain - part of body: (back)    Time: 1140-1210 PT Time Calculation (min) (ACUTE ONLY): 30 min   Charges:   PT Evaluation $PT Eval Moderate Complexity: 1 Mod PT Treatments $Gait Training: 8-22 mins        Rolinda Roan, PT, DPT Acute Rehabilitation Services Pager: [phone] Office: [phone]   Thelma Comp 05/09/2018, 12:46 PM

## 2018-05-09 NOTE — Progress Notes (Signed)
Called to patient's room that patient is aggressive to staff, verbally abusive, and granddaughter [name]) was in the room wanting to talk to a doctor. Presented to the patient's room. Patient is screaming "let me go" ; "I want to go home" ; "I have to pay my bills." Attempts multiple times to talk with the patient but she became agitated and yelled louder. Granddaughter does not understand why we are not giving her medication and taking her for HD. She states that the patient cannot take care of herself and does not want her to be discharged. Explained that the situation is complex. Patient has continuously expressed that she does not want HD and expresses the same thought even after HD. However, with missed HD sessions she progressively becomes encephalopathic, is brought to the hospital and subsequently dialyzed. Palliative care has evaluated the patient on two separate occasions.   I spoke with the Ethic committee that recommended clarifying who the healthcare power of attorney is. Upon discussion with [name], they do not have paperwork to designate a healthcare power of attorney. The patient has 1 daughter that decisions would fall too. I have attempted to call palliative care for an urgent consult and am awaiting a call back. The other option is to give the patient IM haldol, take the patient for HD, then have a family meeting after to align goals of care.   I appreciate all the staff on [facility] and their diligent efforts with the patient. Also appreciate the coordinated care with Nephrology, Palliative care, and the Ethics committee.

## 2018-05-10 DIAGNOSIS — I5030 Unspecified diastolic (congestive) heart failure: Secondary | ICD-10-CM

## 2018-05-10 DIAGNOSIS — R451 Restlessness and agitation: Secondary | ICD-10-CM

## 2018-05-10 LAB — RENAL FUNCTION PANEL
Albumin: 2.4 g/dL — ABNORMAL LOW (ref 3.5–5.0)
Anion gap: 12 (ref 5–15)
BUN: 31 mg/dL — ABNORMAL HIGH (ref 8–23)
CO2: 26 mmol/L (ref 22–32)
Calcium: 8.9 mg/dL (ref 8.9–10.3)
Chloride: 99 mmol/L (ref 98–111)
Creatinine, Ser: 6.26 mg/dL — ABNORMAL HIGH (ref 0.44–1.00)
GFR calc Af Amer: 7 mL/min — ABNORMAL LOW (ref >60)
GFR calc non Af Amer: 6 mL/min — ABNORMAL LOW (ref >60)
Glucose, Bld: 79 mg/dL (ref 70–99)
Phosphorus: 4.9 mg/dL — ABNORMAL HIGH (ref 2.5–4.6)
Potassium: 3.8 mmol/L (ref 3.5–5.1)
Sodium: 137 mmol/L (ref 135–145)

## 2018-05-10 NOTE — Progress Notes (Signed)
   Subjective: Patient doing well this AM. She continues to have back pain. She does not get much relief from the lidocaine patch or the oral medicines. We discussed that palliative care will be by today to have a family meeting and make sure we are aligned with her goals. She understands. Continues to perseverate on getting home and that she has bills to pay.   Objective: Vital signs in last 24 hours: Vitals:   05/09/18 1815 05/09/18 1830 05/09/18 2133 05/10/18 0548  BP: (!) 151/83 (!) 148/83 137/63 (!) 148/62  Pulse: 82 88 85 92  Resp:  19 16 16   Temp:  97.6 F (36.4 C) 97.9 F (36.6 C) 97.9 F (36.6 C)  TempSrc:  Oral Oral Oral  SpO2:  97% 100% 99%   General: Elderly female in no acute distress Pulm: Good air movement with no wheezing or crackles  CV: RRR, systolic crescendo-decrescendo murmur Abdomen: Active bowel sounds, soft, non-distended, no tenderness to palpation  Extremities: No LE edema, palpable thrill in the left upper extremity   Assessment/Plan:  Nancy [name] is a 77 y.o female with ESRD and HFpEF who presented with acute on chronic lumbar back pain that was affecting her ability, and her family's ability, to care for herself. She has been evaluated 8 times in the past 30 days for this problem and was subsequently admitted for further evaluation and pain management.   Hospitalization is complicated by discordant goals of care between the patient and the family. After consultation with the Ethic committee yesterday, the patient was given IM haldol and take for HD. She is alert and back to baseline this AM. The hope is for palliative care to have a goals of care discussion with the patient and her family to align our treatment strategies.   Bilateral Lumbar Back Pain  - Persistent pain that is not relieved by the lidocaine patches or Oxycodone 2.5 mg Q4hours. Pain seems for MSK in etiology as opposed to visceral. Plan will be to escalate medications once family  meeting/GOC discussion is complete.  - PT recommends SNF  Right Renal Mass - CT abdomen with "Intermediate density mass of the right upper pole kidney, measuring 2.8 x 2.1 cm." - Getting CT abdomen with contrast to better assess - May need urology, outpatient vs inpatient   ESRD on HD - Received HD yesterday after getting IM haldol  - Nephrology onboard, appreciate their recommendations  Diet: Renal  VTE ppx: SCDs   CODE STATUS: DNR  Dispo: Anticipated discharge in approximately 2-3 day(s) pending goals of care discussion with patient and family.   Ina Homes, MD 05/10/2018, 10:36 AM

## 2018-05-10 NOTE — Plan of Care (Signed)
  Problem: Clinical Measurements: Goal: Ability to maintain clinical measurements within normal limits will improve Outcome: Progressing Goal: Will remain free from infection Outcome: Progressing Goal: Respiratory complications will improve Outcome: Progressing Goal: Cardiovascular complication will be avoided Outcome: Progressing   Problem: Skin Integrity: Goal: Risk for impaired skin integrity will decrease Outcome: Progressing   

## 2018-05-10 NOTE — Progress Notes (Addendum)
I have seen and examined this patient and agree with the plan of care mental status is returned to baseline argumentative brought to dialysis.  Eventually dialysis done palliative care to be involved with patient  Sherril Croon 05/10/2018, 12:08 PM Subjective:  No cos ,required IM haldol yesterday  Before Hd per Family request  , This am SHE states she did not go to HD yesterday , denies sob , thinks she may go home today , allows me to exam her.     Palliative care / Psychiatry  consults pending   Objective Vital signs in last 24 hours: Vitals:   05/09/18 1815 05/09/18 1830 05/09/18 2133 05/10/18 0548  BP: (!) 151/83 (!) 148/83 137/63 (!) 148/62  Pulse: 82 88 85 92  Resp:  19 16 16   Temp:  97.6 F (36.4 C) 97.9 F (36.6 C) 97.9 F (36.6 C)  TempSrc:  Oral Oral Oral  SpO2:  97% 100% 99%   Weight change:   Physical Exam: General: alert , currently NAD ,baseline confusion (does not remember HD yesterday,had IM haldol  Pre hd ) Heart:RRR , no m,r,g  Lungs: CTA  Abdomen: BS pos ,sOFT , NT, ND Extremities:bipedal trace  Edema   Dialysis Access: LUA AVF  Pos bruit   Dialysis Orders: MWF [facility] 4 hr 300/600 - noncompliant - never ever runs full treatment and misses multiple treatments EDW 59 3 K 2.5 Ca no heparin Calcitriol 1.75 Mircera 200 q 2 weeks venofer 50 per week   Problem/Plan  1. Altered mental status -- Today  About baseline,  Noted yesterday very argumentative  brought to dialysis and refuses to dialyze and Family requested IM Haldol  Pre hd and eventually hd done. Needs Psy ad palliative care to see // WILL Not get IM Haldol  as op before Hd , Today when I questioned he about HD ,she stated she will cotinue  HD  Has  multiple ED visits and recent [place] admission where she declined SNF . Has baseline dementia - confusion escalated.  2. ESRD - MWF - K 3.8  This am, -Noted before yesterdays HD .apparent last dialysis 10/27 (Sunday) - noncompliance with outpatient HD -Next HD  in  the am. 3. Anemia - hgb 9.4  - last outpatient Mircera 200 10/14 - redose Monday 11/04 , if she elects to dialyze 4. Secondary hyperparathyroidism - calcitriol if she dialyzes 5. HTN/volume - review of outpatient records indicate she has been below edw for some time - post wt 55.1 10/21 which was the last time she had outpatient HD- does have bilateral pleural effusions 6. Nutrition - regular diet 7. Low back pain - probably newly dc renal cell right renal pelvis - noted on lumbar CT   Ernest Haber, PA-C [place] [phone] 05/10/2018,9:44 AM  LOS: 1 day   Labs: Basic Metabolic Panel: Recent Labs  Lab 05/08/18 1246 05/09/18 1614 05/10/18 0553  NA 137 136 137  K 4.1 4.0 3.8  CL 100 100 99  CO2 25 23 26   GLUCOSE 102* 90 79  BUN 50* 62* 31*  CREATININE 8.96* 9.79* 6.26*  CALCIUM 9.3 8.9 8.9  PHOS  --  7.1* 4.9*   Liver Function Tests: Recent Labs  Lab 05/09/18 1614 05/10/18 0553  ALBUMIN 2.3* 2.4*   No results for input(s): LIPASE, AMYLASE in the last 168 hours. No results for input(s): AMMONIA in the last 168 hours. CBC: Recent Labs  Lab 05/06/18 2208 05/08/18 1246 05/09/18 1614  WBC 10.6* 9.0 9.5  NEUTROABS 8.6*  --   --   HGB 10.5* 8.9* 9.4*  HCT 34.7* 30.3* 32.0*  MCV 93.0 92.1 93.0  PLT 245 223 229   Cardiac Enzymes: No results for input(s): CKTOTAL, CKMB, CKMBINDEX, TROPONINI in the last 168 hours. CBG: No results for input(s): GLUCAP in the last 168 hours.  Studies/Results: Ct Lumbar Spine Wo Contrast  Result Date: 05/08/2018 CLINICAL DATA:  Fall 2 days ago in bath tub. EXAM: CT LUMBAR SPINE WITHOUT CONTRAST TECHNIQUE: Multidetector CT imaging of the lumbar spine was performed without intravenous contrast administration. Multiplanar CT image reconstructions were also generated. COMPARISON:  CT abdomen pelvis 01/25/2018 FINDINGS: Segmentation: Normal Alignment: Normal Vertebrae: There is no acute fracture or osseous erosion.  Paraspinal and other soft tissues: Calcific aortic atherosclerosis. Heterogeneously Dense area at the right renal pelvis measuring 2.8 x 2.1 cm with intermediate density. This is new compared to the CT abdomen and pelvis of 01/25/2018. bilateral pleural effusions. Disc levels: No bony spinal canal stenosis. No high-grade foraminal stenosis. No traumatic disc herniation. IMPRESSION: 1. Intermediate density mass of the right upper pole kidney, measuring 2.8 x 2.1 cm. Dedicated abdominal imaging with CT of the abdomen and pelvis with contrast or MRI of the abdomen with and without contrast is recommended, as this appearance is compatible with renal cell carcinoma. 2. Bilateral pleural effusions. 3. No acute fracture or static subluxation of the lumbar spine or visualized lower thoracic spine. Electronically Signed   By: Ulyses Jarred M.D.   On: 05/08/2018 15:14   Medications:  . [START ON 05/11/2018] calcitRIOL  1.75 mcg Oral Q M,W,F-HD  . Chlorhexidine Gluconate Cloth  6 each Topical Q0600  . [START ON 05/11/2018] darbepoetin (ARANESP) injection - DIALYSIS  200 mcg Intravenous Q Mon-HD  . feeding supplement (NEPRO CARB STEADY)  237 mL Oral Q24H  . feeding supplement (PRO-STAT SUGAR FREE 64)  30 mL Oral BID  . heparin  5,000 Units Subcutaneous Q8H  . lidocaine  1 patch Transdermal Q24H  . pantoprazole  40 mg Oral Daily
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
## 2018-05-10 NOTE — Progress Notes (Signed)
Internal Medicine Attending:   I saw and examined the patient. I reviewed the resident's note and I agree with the resident's findings and plan as documented in the resident's note.  As noted she has no recollection of undergoing hemodialysis yesterday.  She was treated with Haldol for agitation and combativeness towards staff for him.  She continues to report back pain otherwise she is calm and resting in bedside chair.  She is fully oriented.  As noted by Dr. Tarri Abernethy we will work towards fully decide her goals of care so that her fluid cannot continue to face the situations with her goals of care are unclear.  Treatment with Haldol before every hemodialysis session as requested by family is not an option.

## 2018-05-11 ENCOUNTER — Other Ambulatory Visit: Payer: Self-pay

## 2018-05-11 ENCOUNTER — Encounter (HOSPITAL_COMMUNITY): Payer: Self-pay | Admitting: *Deleted

## 2018-05-11 DIAGNOSIS — M170 Bilateral primary osteoarthritis of knee: Secondary | ICD-10-CM | POA: Diagnosis present

## 2018-05-11 DIAGNOSIS — N186 End stage renal disease: Secondary | ICD-10-CM

## 2018-05-11 DIAGNOSIS — N2889 Other specified disorders of kidney and ureter: Secondary | ICD-10-CM | POA: Diagnosis not present

## 2018-05-11 DIAGNOSIS — M6281 Muscle weakness (generalized): Secondary | ICD-10-CM | POA: Diagnosis not present

## 2018-05-11 DIAGNOSIS — R262 Difficulty in walking, not elsewhere classified: Secondary | ICD-10-CM | POA: Diagnosis not present

## 2018-05-11 DIAGNOSIS — Z7189 Other specified counseling: Secondary | ICD-10-CM | POA: Diagnosis not present

## 2018-05-11 DIAGNOSIS — G8929 Other chronic pain: Secondary | ICD-10-CM | POA: Diagnosis not present

## 2018-05-11 DIAGNOSIS — E43 Unspecified severe protein-calorie malnutrition: Secondary | ICD-10-CM | POA: Diagnosis not present

## 2018-05-11 DIAGNOSIS — I10 Essential (primary) hypertension: Secondary | ICD-10-CM | POA: Diagnosis not present

## 2018-05-11 DIAGNOSIS — Z66 Do not resuscitate: Secondary | ICD-10-CM | POA: Diagnosis present

## 2018-05-11 DIAGNOSIS — Z515 Encounter for palliative care: Secondary | ICD-10-CM | POA: Diagnosis not present

## 2018-05-11 DIAGNOSIS — I350 Nonrheumatic aortic (valve) stenosis: Secondary | ICD-10-CM | POA: Diagnosis present

## 2018-05-11 DIAGNOSIS — R011 Cardiac murmur, unspecified: Secondary | ICD-10-CM | POA: Diagnosis present

## 2018-05-11 DIAGNOSIS — G9340 Encephalopathy, unspecified: Secondary | ICD-10-CM | POA: Diagnosis not present

## 2018-05-11 DIAGNOSIS — D631 Anemia in chronic kidney disease: Secondary | ICD-10-CM | POA: Diagnosis not present

## 2018-05-11 DIAGNOSIS — Z743 Need for continuous supervision: Secondary | ICD-10-CM | POA: Diagnosis not present

## 2018-05-11 DIAGNOSIS — I132 Hypertensive heart and chronic kidney disease with heart failure and with stage 5 chronic kidney disease, or end stage renal disease: Secondary | ICD-10-CM | POA: Diagnosis present

## 2018-05-11 DIAGNOSIS — Z8249 Family history of ischemic heart disease and other diseases of the circulatory system: Secondary | ICD-10-CM | POA: Diagnosis not present

## 2018-05-11 DIAGNOSIS — Y93E1 Activity, personal bathing and showering: Secondary | ICD-10-CM | POA: Diagnosis not present

## 2018-05-11 DIAGNOSIS — Z885 Allergy status to narcotic agent status: Secondary | ICD-10-CM | POA: Diagnosis not present

## 2018-05-11 DIAGNOSIS — Z9115 Patient's noncompliance with renal dialysis: Secondary | ICD-10-CM | POA: Diagnosis not present

## 2018-05-11 DIAGNOSIS — N289 Disorder of kidney and ureter, unspecified: Secondary | ICD-10-CM | POA: Diagnosis not present

## 2018-05-11 DIAGNOSIS — I5030 Unspecified diastolic (congestive) heart failure: Secondary | ICD-10-CM | POA: Diagnosis not present

## 2018-05-11 DIAGNOSIS — M545 Low back pain: Secondary | ICD-10-CM | POA: Diagnosis not present

## 2018-05-11 DIAGNOSIS — E785 Hyperlipidemia, unspecified: Secondary | ICD-10-CM | POA: Diagnosis not present

## 2018-05-11 DIAGNOSIS — Z91048 Other nonmedicinal substance allergy status: Secondary | ICD-10-CM | POA: Diagnosis not present

## 2018-05-11 DIAGNOSIS — I120 Hypertensive chronic kidney disease with stage 5 chronic kidney disease or end stage renal disease: Secondary | ICD-10-CM | POA: Diagnosis not present

## 2018-05-11 DIAGNOSIS — I5032 Chronic diastolic (congestive) heart failure: Secondary | ICD-10-CM | POA: Diagnosis present

## 2018-05-11 DIAGNOSIS — N2581 Secondary hyperparathyroidism of renal origin: Secondary | ICD-10-CM | POA: Diagnosis present

## 2018-05-11 DIAGNOSIS — Z8673 Personal history of transient ischemic attack (TIA), and cerebral infarction without residual deficits: Secondary | ICD-10-CM | POA: Diagnosis not present

## 2018-05-11 DIAGNOSIS — N189 Chronic kidney disease, unspecified: Secondary | ICD-10-CM | POA: Diagnosis not present

## 2018-05-11 DIAGNOSIS — F0390 Unspecified dementia without behavioral disturbance: Secondary | ICD-10-CM | POA: Diagnosis present

## 2018-05-11 DIAGNOSIS — Z992 Dependence on renal dialysis: Secondary | ICD-10-CM | POA: Diagnosis not present

## 2018-05-11 DIAGNOSIS — M549 Dorsalgia, unspecified: Secondary | ICD-10-CM | POA: Diagnosis not present

## 2018-05-11 DIAGNOSIS — W182XXA Fall in (into) shower or empty bathtub, initial encounter: Secondary | ICD-10-CM | POA: Diagnosis present

## 2018-05-11 DIAGNOSIS — R451 Restlessness and agitation: Secondary | ICD-10-CM | POA: Diagnosis not present

## 2018-05-11 DIAGNOSIS — K219 Gastro-esophageal reflux disease without esophagitis: Secondary | ICD-10-CM | POA: Diagnosis not present

## 2018-05-11 DIAGNOSIS — R51 Headache: Secondary | ICD-10-CM | POA: Diagnosis not present

## 2018-05-11 DIAGNOSIS — R279 Unspecified lack of coordination: Secondary | ICD-10-CM | POA: Diagnosis not present

## 2018-05-11 DIAGNOSIS — M5489 Other dorsalgia: Secondary | ICD-10-CM | POA: Diagnosis not present

## 2018-05-11 DIAGNOSIS — Z5329 Procedure and treatment not carried out because of patient's decision for other reasons: Secondary | ICD-10-CM | POA: Diagnosis present

## 2018-05-11 LAB — RENAL FUNCTION PANEL
Albumin: 2.2 g/dL — ABNORMAL LOW (ref 3.5–5.0)
Anion gap: 9 (ref 5–15)
BUN: 45 mg/dL — AB (ref 8–23)
CO2: 25 mmol/L (ref 22–32)
Calcium: 9.1 mg/dL (ref 8.9–10.3)
Chloride: 103 mmol/L (ref 98–111)
Creatinine, Ser: 7.73 mg/dL — ABNORMAL HIGH (ref 0.44–1.00)
GFR calc Af Amer: 5 mL/min — ABNORMAL LOW (ref >60)
GFR calc non Af Amer: 5 mL/min — ABNORMAL LOW (ref >60)
Glucose, Bld: 131 mg/dL — ABNORMAL HIGH (ref 70–99)
POTASSIUM: 3.9 mmol/L (ref 3.5–5.1)
Phosphorus: 5.9 mg/dL — ABNORMAL HIGH (ref 2.5–4.6)
SODIUM: 137 mmol/L (ref 135–145)

## 2018-05-11 LAB — CBC
HCT: 31.3 % — ABNORMAL LOW (ref 36.0–46.0)
Hemoglobin: 9.5 g/dL — ABNORMAL LOW (ref 12.0–15.0)
MCH: 28 pg (ref 26.0–34.0)
MCHC: 30.4 g/dL (ref 30.0–36.0)
MCV: 92.3 fL (ref 80.0–100.0)
NRBC: 0 % (ref 0.0–0.2)
PLATELETS: 216 10*3/uL (ref 150–400)
RBC: 3.39 MIL/uL — AB (ref 3.87–5.11)
RDW: 18.2 % — ABNORMAL HIGH (ref 11.5–15.5)
WBC: 7.6 10*3/uL (ref 4.0–10.5)

## 2018-05-11 MED ORDER — CALCITRIOL 0.5 MCG PO CAPS
ORAL_CAPSULE | ORAL | Status: AC
  Filled 2018-05-11: qty 3

## 2018-05-11 MED ORDER — DARBEPOETIN ALFA 200 MCG/0.4ML IJ SOSY
PREFILLED_SYRINGE | INTRAMUSCULAR | Status: AC
  Filled 2018-05-11: qty 0.4

## 2018-05-11 MED ORDER — CALCITRIOL 0.25 MCG PO CAPS
ORAL_CAPSULE | ORAL | Status: AC
  Filled 2018-05-11: qty 1

## 2018-05-11 NOTE — Patient Outreach (Signed)
Nancy [name]) Care Management  05/11/2018  Nancy [name] 31-Aug-1940 [id number]   TELEPHONE SCREENING Referral date: 05/06/18 Referral source: primary MD office Referral reason: Needs evaluation for possible SNF placement for rehab  Insurance: [employer]  ADT notification: Patient admitted to the hospital on 05/08/18.   PLAN;  Will notify [facility] hospital liaison of patients admission   [name] Telephonic  [phone]

## 2018-05-11 NOTE — Consult Note (Signed)
Consultation Note Date: 05/11/2018   Patient Name: [place] B Maiello  DOB: 1941-02-26  MRN: [id number]  Age / Sex: 77 y.o., female  PCP: Aldine Contes, MD Referring Physician: Oda Kilts, MD  Reason for Consultation: Establishing goals of care and Psychosocial/spiritual support  HPI/Patient Profile: 77 y.o. female  with past medical history of ESRD with HD started about 1 year ago, CHF, Colitis, CVA '09, Diverticulosis, HTN, arthritis BL knees, weight loss admitted on 05/08/2018 with acute on chronic back pain, ESRD.   Clinical Assessment and Goals of Care: Mrs. Knarr is oriented to person place and time.  she is able to tell me her daughters name.  She does at times ask questions like we are at her home, but then will tell us she wants to go home.  Her eyes are closed during most of our conversation, but she will open her left eye when asked.   We talk about home life and functional status. Mrs. Ra tells me that she lives alone and gets her own groceries/cooks.  Daughter [name] tells me that she and her daughter do the grocery shopping and Mrs. Cathell does light cooking, (no meats as she does not cook until done). Susie endorses a weigh loss from over 200 lbs to 120's lbs in the last year.   We talk in detail about Mrs. Pritz declining to take HD treatment.  She tells [place] that she will go "tomorrow".  Susie states that Mrs. Rovner started treatments right after her husband passed, and that for the first 2 months she went without complaint.  The last 10 months have been difficult with poor compliance and complaints. I share with Mrs. Danziger that she decides if she wants treatments, and that we will continue to care for her if/when she decides to stop.  I share with Susie that the medical teams cannot chemically restrain her will haldol prior to her HD sessions. Susie states understanding.  Near the end of our  discussion, Mrs. Ripley states that she will go to HD today.   With permission, I share with daughter [name] the suspicion for Renal Cell Carcinoma (RCC)of the right kidney.   She is tearful and states she did not know this.  With permission, this is discussed with [name].  She seems to already know this, and shakes her head "no" when I ask if she is surprised by this.  We talk about possibility of further testing and what treatments may look like.  I share that Mrs. Szeliga is weak and I consider that surgery or cancer treatments would be hard.     Mrs. Pieper tells me that she is willing to go to rehab.  Susie agrees stating they would like the rehab on [address]    I share a diagram of the chronic illness trajectory and we talk about what is normal and expected. I share that Mrs. Sangiovanni will get sick again and question how to best care for her.  Susie is again tearful stating that she  sees her mother's suffering. She tells me that she will discuss choices with her daughter [name]. We talk about the possible/likely RCC and ask Susie to consider how this may impact our direction of care.   Conference with Lanice Schwab rt [place] discussion and ethics consult.  Conference with hospitalist, DO Hoffman rt [place] discussion.   HCPOA NEXT OF KIN - Mrs. Voorheis husband of 52 years died approximately 1 year ago.  She has 1 child, [name]. There is no HCPOA paperwork.    SUMMARY OF RECOMMENDATIONS   At this point, Mrs. Vahle tells me that she will have HD today.   It is clear that she wants to be able to take HD when she desires and not be made to go if she does not feel like it.    Mrs. Servello tells me that if she stops HD she will die.   Mrs. Evora is willing to go to rehab if qualifed.   Susie and I discuss how to best care for [name] when she doesn't want to go to HD and then becomes unresponsive.  Do we provide "emergency HD" or care for her by letting nature take it's course, also  considering likely RCC.   Susie is considering how to listen to her mother's voice, stating that Mr. Peale has not wanted HD for the last 10 months.    Code Status/Advance Care Planning:  DNR -  Daughter [name] is at bedside and tells me that she can abide her mothers wish to allow a natural death, DNR.    We also talk about preferred place of death.  Mrs. Lempke states home is her preferred place of death.  I share that she will need help with symptom management if she chooses to stop HD and "let nature take it's course".    Mrs. Pesnell is very against Hospice at home or residential.  [name] and I talk more about this after Mrs. Marts goes to HD.  Susie shares that Mrs. Bonser mother had hospice and she feels "some type of way" about hospice services.  Susie goes further to share that Mr. Bourdeau passed at [facility] and he too was very against Hospice services.   Susie and I talk about how she will care for Mrs. Probus when she decides she doesn't want to go to HD, and then becomes unresponsive.  Susie is tearful, stating that she sees her mother's suffering.  We talk about physical and existential suffering.    Susie tells me that Mrs. Lingenfelter did not want to take HD, and did not complain for the first two months.  She shares that Mrs. Hieronymus has not wanted HD for the last 10 months.     Symptom Management:   Per hospitalist, no additional needs at this time.   Palliative Prophylaxis:   Frequent Pain Assessment and Turn Reposition  Additional Recommendations (Limitations, Scope, Preferences):  treat the treatable, but no CPR, no intubation.   Psycho-social/Spiritual:   Desire for further Chaplaincy support:no  Additional Recommendations: Caregiving  Support/Resources and Education on Hospice  Prognosis:   < 3 months, or less would not be surprising based on patients lack of desire to continue treatments (she will go intermittently), frailty and poor functional status, 9 admits  with 11 ED visits in 6 months. Of course, if [name] or Susie decide no further HD treatments then 2 weeks or less is expected (both patient and family are aware of this timeline).  Discharge  Planning: Mrs. Totino tells me that she is willing to go to rehab, [address]", daughter [name] agrees.       Primary Diagnoses: Present on Admission: . Back pain . End stage renal disease ([place]) . GERD . Essential hypertension   I have reviewed the medical record, interviewed the patient and family, and examined the patient. The following aspects are pertinent.  Past Medical History:  Diagnosis Date  . Anemia   . Aortic stenosis   . Bacterial sinusitis 09/17/2011  . CHF (congestive heart failure) ([place])   . CKD (chronic kidney disease) stage 4, GFR 15-29 ml/min ([place]) 08/11/2006   Cr continues to increase. Proteinuria on UA 02/10/12.    . Colitis   . CVA (cerebrovascular accident) [facility])    New hemorrhagic per CT scan '09  . Diverticulosis of colon   . Dysfunctional uterine bleeding   . ESRD (end stage renal disease) on dialysis ([place])    "MWF; [address]" (11/27/2017)  . Fecal impaction ([place])   . Headache(784.0)   . Heart murmur   . HERNIORRHAPHY, HX OF 08/11/2006  . Hypertension   . OA (osteoarthritis)    bilateral knees  . Postmenopausal   . Pulmonary nodule   . TINEA CRURIS 01/12/2007   Social History   Socioeconomic History  . Marital status: Widowed    Spouse name: Not on file  . Number of children: Not on file  . Years of education: Not on file  . Highest education level: Not on file  Occupational History  . Not on file  Social Needs  . Financial resource strain: Not on file  . Food insecurity:    Worry: Not on file    Inability: Not on file  . Transportation needs:    Medical: Not on file    Non-medical: Not on file  Tobacco Use  . Smoking status: Never Smoker  . Smokeless tobacco: Never Used  Substance and Sexual Activity  . Alcohol use: No    Alcohol/week: 0.0  standard drinks  . Drug use: No    Comment: 08/15/08 UDS + cocaine  . Sexual activity: Not Currently  Lifestyle  . Physical activity:    Days per week: Not on file    Minutes per session: Not on file  . Stress: Not on file  Relationships  . Social connections:    Talks on phone: Not on file    Gets together: Not on file    Attends religious service: Not on file    Active member of club or organization: Not on file    Attends meetings of clubs or organizations: Not on file    Relationship status: Not on file  Other Topics Concern  . Not on file  Social History Narrative   Married, lives with her husband. 1 child.          Family History  Problem Relation Age of Onset  . Hypertension Mother   . Congestive Heart Failure Mother   . Heart attack Brother 69   Scheduled Meds: . calcitRIOL      . calcitRIOL      . calcitRIOL  1.75 mcg Oral Q M,W,F-HD  . Chlorhexidine Gluconate Cloth  6 each Topical Q0600  . Darbepoetin Alfa      . darbepoetin (ARANESP) injection - DIALYSIS  200 mcg Intravenous Q Mon-HD  . feeding supplement (NEPRO CARB STEADY)  237 mL Oral Q24H  . feeding supplement (PRO-STAT SUGAR FREE 64)  30 mL Oral BID  .  heparin  5,000 Units Subcutaneous Q8H  . lidocaine  1 patch Transdermal Q24H  . pantoprazole  40 mg Oral Daily   Continuous Infusions: PRN Meds:.acetaminophen **OR** acetaminophen, oxyCODONE, senna-docusate Medications Prior to Admission:  Prior to Admission medications   Medication Sig Start Date End Date Taking? Authorizing Provider  calcitRIOL (ROCALTROL) 0.5 MCG capsule Take 4 capsules (2 mcg total) by mouth every Monday, Wednesday, and Friday with hemodialysis. 12/30/17  Yes Neva Seat, MD  diphenhydrAMINE-zinc acetate (BENADRYL) cream Apply topically 3 (three) times daily as needed for itching. 04/20/18  Yes Masoudi, Elhamalsadat, MD  latanoprost (XALATAN) 0.005 % ophthalmic solution Place 1 drop into both eyes at bedtime. 04/11/18  Yes  [provider]  aspirin EC 81 MG EC tablet Take 1 tablet (81 mg total) by mouth daily. Patient not taking: Reported on 04/07/2018 12/10/16   Kalman Shan Ratliff, DO  diclofenac sodium (VOLTAREN) 1 % GEL Apply 4 g topically 4 (four) times daily. Patient not taking: Reported on 04/18/2018 01/20/18   Mosetta Anis, MD  guaiFENesin (ROBITUSSIN) 100 MG/5ML SOLN Take 5 mLs (100 mg total) by mouth every 4 (four) hours as needed for cough or to loosen phlegm. Patient not taking: Reported on 05/10/2018 04/20/18   Masoudi, Dorthula Rue, MD  hydrocortisone cream 0.5 % Apply 1 application topically 2 (two) times daily. Patient not taking: Reported on 04/18/2018 03/05/18   Lars Mage, MD  mirtazapine (REMERON) 15 MG tablet Take 1 tablet (15 mg total) by mouth at bedtime. Patient not taking: Reported on 04/18/2018 02/01/18   [name], MD  nitroGLYCERIN (NITROSTAT) 0.3 MG SL tablet Place 1 tablet (0.3 mg total) under the tongue every 5 (five) minutes as needed for chest pain. Patient not taking: Reported on 04/18/2018 02/27/17   Sid Falcon, MD  oxyCODONE-acetaminophen (PERCOCET) 5-325 MG tablet Take 0.5-1 tablets by mouth every 4 (four) hours as needed. Patient not taking: Reported on 05/10/2018 04/30/18   Orpah Greek, MD  pantoprazole (PROTONIX) 40 MG tablet Take 1 tablet (40 mg total) by mouth daily. Patient not taking: Reported on 05/10/2018 01/14/18 04/17/18  [name], MD  promethazine (PHENERGAN) 12.5 MG tablet Take 1 tablet (12.5 mg total) by mouth every 6 (six) hours as needed for nausea or vomiting (Take 1 prior to dialysis). Take 1 tablet half an hour before meal. Patient not taking: Reported on 04/18/2018 02/01/18   [name], MD   Allergies  Allergen Reactions  . Hydrocodone Nausea And Vomiting and Other (See Comments)    Dizziness, also  . Tape Other (See Comments)    TAPE LEAVES DARK PATCHES ON THE SKIN!!   Review of Systems  Unable to perform ROS:  Mental status change    Physical Exam  Constitutional: She is oriented to person, place, and time. No distress.  Appears weak and frail, makes brief eye contact when requested. Angry at times.   HENT:  Head: Atraumatic.  Some temporal wasting.   Cardiovascular: Normal rate.  Pulmonary/Chest: Effort normal. No respiratory distress.  Abdominal: Soft. She exhibits no distension. There is no guarding.  Neurological: She is alert and oriented to person, place, and time.  Periods of confusion  Skin: Skin is warm and dry.  Psychiatric:  Mostly calm and cooperative, becoming angry if conversation goes on too long   Nursing note and vitals reviewed.   Vital Signs: BP (!) 164/70   Pulse 93   Temp 98.2 F (36.8 C) (Oral)   Resp 15   SpO2  100%  Pain Scale: 0-10 POSS *See Group Information*: 1-Acceptable,Awake and alert Pain Score: Asleep   SpO2: SpO2: 100 % O2 Device:SpO2: 100 % O2 Flow Rate: .   IO: Intake/output summary:   Intake/Output Summary (Last 24 hours) at 05/11/2018 1355 Last data filed at 05/11/2018 0900 Gross per 24 hour  Intake 220 ml  Output -  Net 220 ml    LBM: Last BM Date: 05/08/18 Baseline Weight: Weight: (Bed scale broke. Pt just had Haldol IM) Most recent weight: Weight: (Bed scale broke. Pt just had Haldol IM)     Palliative Assessment/Data:   Flowsheet Rows     Most Recent Value  Intake Tab  Referral Department  Hospitalist  Unit at Time of Referral  Cardiac/Telemetry Unit  Palliative Care Primary Diagnosis  Nephrology  Date Notified  05/09/18  Palliative Care Type  Return patient Palliative Care  Reason for referral  Advance Care Planning, Clarify Goals of Care  Date of Admission  05/08/18  Date first seen by Palliative Care  05/11/18  # of days Palliative referral response time  2 Day(s)  # of days IP prior to Palliative referral  1  Clinical Assessment  Palliative Performance Scale Score  40%  Pain Max last 24 hours  Not able to report    Pain Min Last 24 hours  Not able to report  Dyspnea Max Last 24 Hours  Not able to report  Dyspnea Min Last 24 hours  Not able to report  Psychosocial & Spiritual Assessment  Palliative Care Outcomes  Patient/Family meeting held?  Yes  Palliative Care Outcomes  Provided advance care planning, Clarified goals of care, Provided psychosocial or spiritual support, Counseled regarding hospice  Patient/Family wishes: Interventions discontinued/not started   Mechanical Ventilation      Time In: 1000 Time Out: 1130 Time Total: 90 minutes Greater than 50%  of this time was spent counseling and coordinating care related to the above assessment and plan.  Signed by: Drue Novel, NP   Please contact Palliative Medicine Team phone at [phone] for questions and concerns.  For individual provider: See Shea Evans

## 2018-05-11 NOTE — Progress Notes (Signed)
Received a call from daughter and updated her with patient's behavior during HD tx and the reason the tx been terminated with 1.5 hrs remaining d/t infiltration noted on the venous accesss d/t pt persistent to moved/raised her left arm. DAughter verbalized understanding with no questions/concerns raised.

## 2018-05-11 NOTE — Progress Notes (Addendum)
Pt has been noted a lot of times by HD staff,pt screaiming and verbalizing the same words repeatedly c'mon c'mon take me off Explained to pt the need for her to complete her full HD tx. PA aware with no new orders received at this time.This Probation officer noted pt has been moving her left cannulated arm a lot of times despite repeated warnings for possible complications/infiltration Approximated at 1428. Pt has been observed raising/moving her left arm wherein  her AVF  Been cannulated. And Noted  Venous needle bout to come off and golf ball size hematoma .on the venous access site.. Ice packed provided. Pt has no complaints.denies any pain/discomfort. Alert/ irritable in no apparent distress,confused at times. VSS. Nephrologist at bedside aware of above and ordered to terminate HD tx with 1 hr 73mins remaining.Report given to primary nurse.

## 2018-05-11 NOTE — Progress Notes (Signed)
[facility] Progress Note   Dialysis Orders: MWF [facility] 4 hr 300/600 - noncompliant - never ever runs full treatment and misses multiple treatments EDW 59 3 K 2.5 Ca no heparin Calcitriol 1.75 Mircera 200 q 2 weeks venofer 50 per week  Assessment/Plan: 1. Altered mental status --. Has baseline dementia - confusion escalated. Working on goals of care 2. ESRD - MWF - K 4.1 Friday - no labs yet today- apparent last dialysis 10/27 (Sunday)  Did have 2 hrs of HD Saturday with haldo 3. Anemia - hgb 8.9 >9.4 - last outpatient Mircera 200 10/14 - redose Monday, if she elects to dialyze 4. Secondary hyperparathyroidism - calcitriol if she dialyzes 5. HTN/volume - BP a little high review of outpatient records indicate she has been below edw for some time - post wt 55.1 10/21 which was the last time she had outpatient HD-net UF 2 L 11/2 - no weights; needs more volume off 6. Nutrition - alb 2.4 regular diet + supplements- eating about 1/3 - 1/2 breakfast tray 7. Low back pain - probably newly dc renal cell right renal pelvis - noted on lumbar CT 8. Goals of care/Ethics consult -continues to refuse dialysis and states will go another day. Has never had good dialysis attendance - literally signs off nearly every treatment and frequently skips treatments and this has worsened recently. Sedation with haldol is not an option.  Patient wishes dialysis on her own terms which unfortunately leads to marginal outcomes at best.  Not clear if the probably renal cell carcinoma has been discussed with pt/daughter.  Appreciate chaplain/palliative care support. If she is to continue HD, she is not safe to live alone.  Myriam Jacobson, PA-C [facility] [place] [phone] 05/11/2018,10:09 AM  LOS: 1 day   Subjective:   Daughter [name] in room.She is Ms. Haye's only child.  Daughter tells me for the most part, the patient has been living alone and she only recently has been spending the night.  Patient is unable to give correct birth date (off by one day) or the year of birth of her daughter.   Objective Vitals:   05/10/18 0548 05/10/18 2020 05/11/18 0514 05/11/18 0939  BP: (!) 148/62 (!) 143/60 (!) 152/63 (!) 155/65  Pulse: 92 95 97 96  Resp: 16 20 17 20   Temp: 97.9 F (36.6 C) 97.9 F (36.6 C) 97.6 F (36.4 C) 98.2 F (36.8 C)  TempSrc: Oral Oral Oral Oral  SpO2: 99% 100% 99% 100%   Physical Exam General: NAD somewhat argumentative when discussing the need for dialysis Heart: RRR Lungs: no rales Abdomen:soft NT Extremities: no sig edeam Dialysis Access:    Additional Objective Labs: Basic Metabolic Panel: Recent Labs  Lab 05/08/18 1246 05/09/18 1614 05/10/18 0553  NA 137 136 137  K 4.1 4.0 3.8  CL 100 100 99  CO2 25 23 26   GLUCOSE 102* 90 79  BUN 50* 62* 31*  CREATININE 8.96* 9.79* 6.26*  CALCIUM 9.3 8.9 8.9  PHOS  --  7.1* 4.9*   Liver Function Tests: Recent Labs  Lab 05/09/18 1614 05/10/18 0553  ALBUMIN 2.3* 2.4*   No results for input(s): LIPASE, AMYLASE in the last 168 hours. CBC: Recent Labs  Lab 05/06/18 2208 05/08/18 1246 05/09/18 1614  WBC 10.6* 9.0 9.5  NEUTROABS 8.6*  --   --   HGB 10.5* 8.9* 9.4*  HCT 34.7* 30.3* 32.0*  MCV 93.0 92.1 93.0  PLT 245 223 229  Blood Culture    Component Value Date/Time   SDES BLOOD RIGHT WRIST 04/22/2018 1800   SPECREQUEST  04/22/2018 1800    BOTTLES DRAWN AEROBIC ONLY Blood Culture adequate volume   CULT  04/22/2018 1800    NO GROWTH 5 DAYS Performed at [facility], [place] [address], [place], [place] [address]    REPTSTATUS 04/27/2018 FINAL 04/22/2018 1800    Cardiac Enzymes: No results for input(s): CKTOTAL, CKMB, CKMBINDEX, TROPONINI in the last 168 hours. CBG: No results for input(s): GLUCAP in the last 168 hours. Iron Studies: No results for input(s): IRON, TIBC, TRANSFERRIN, FERRITIN in the last 72 hours. Lab Results  Component Value Date   INR 1.13 03/29/2018   INR  1.03 03/06/2018   INR 1.30 02/17/2017   Studies/Results: No results found. Medications:  . calcitRIOL  1.75 mcg Oral Q M,W,F-HD  . Chlorhexidine Gluconate Cloth  6 each Topical Q0600  . darbepoetin (ARANESP) injection - DIALYSIS  200 mcg Intravenous Q Mon-HD  . feeding supplement (NEPRO CARB STEADY)  237 mL Oral Q24H  . feeding supplement (PRO-STAT SUGAR FREE 64)  30 mL Oral BID  . heparin  5,000 Units Subcutaneous Q8H  . lidocaine  1 patch Transdermal Q24H  . pantoprazole  40 mg Oral Daily

## 2018-05-11 NOTE — Ethics Note (Signed)
Ethics Follow up note:  I spoke with Dr. Tarri Abernethy on Saturday regarding this patient and his concern that the patient has on previous occasions, when mentally competent, indicated that she no longer wanted HD and understood that this would lead to her death. When patient becomes ill and is not able to make her own decisions the daughter and granddaughter have insisted on HD. The daughter indicated that she is the POA and was insisting at this time that HD be given. I was told that there is no paper work on the chart indicating this.  I suggested the following:   1. Ask the daughter for the paper work indicating that she is the [employer]. If there is not paper work, then clarify if there are other children. If there are other children then when the patient is not mentally competent decisions would be made by a majority of legal children over 18 yrs. Of age.    2. Dr. Tarri Abernethy indicated that HD would help the patient to become oriented and able to maker her own decisions. I suggest that for the sake of assuring Patient autonomy that it may be better to do HD and then move toward an opportunity for goals of care to be discussed in the presence of the daughter thereby eliminating any question about the pt.'s wishes.    3. Since the patient's wishes to not receive HD were imparted to health care staff, but not in the presence of the daughter and since [place] has been involved already, I suggested a palliative care consult when patient is competent to include the daughter and approach goals of care and if patient was or was not wanting a continuation of HD.   I spoke to Quinn Axe, NP Palliative Care who is to meet with pt. And daughter today. I updated her on this concern. She indicated that she would document the outcome of the goals of care conversation.   If we can be of further assistance please feel free to page [phone].  [facility] Ethics Consult

## 2018-05-11 NOTE — Progress Notes (Signed)
   Subjective: Ms. Szwed was seen and evaluated this morning. She asks to sit up. She does not want to lye back. Denies any back pain today. No shortness of breath. No acute event overnight.  Objective:  Vital signs in last 24 hours: Vitals:   05/09/18 1830 05/09/18 2133 05/10/18 0548 05/10/18 2020  BP: (!) 148/83 137/63 (!) 148/62 (!) 143/60  Pulse: 88 85 92 95  Resp: 19 16 16 20   Temp: 97.6 F (36.4 C) 97.9 F (36.6 C) 97.9 F (36.6 C) 97.9 F (36.6 C)  TempSrc: Oral Oral Oral Oral  SpO2: 97% 100% 99% 100%   Physical Exam  Constitutional: She is oriented to person, place, and time.  HENT:  Head: Normocephalic and atraumatic.  Eyes: EOM are normal.  Cardiovascular: Normal rate and regular rhythm.  Murmur heard. Pulmonary/Chest: Effort normal. Decreased sound at bases. Bibasilar crackle Abdominal: Soft. Bowel sounds are normal. She exhibits no distension. There is no tenderness. No CVA tenderness. Musculoskeletal: She exhibits no edema or tenderness.  Neurological: She is alert and oriented to person, place, and time.   Assessment/Plan:  Principal Problem:   Back pain Active Problems:   Essential hypertension   GERD   End stage renal disease (HCC)   Right renal mass  Nancy [name] is a 77 y.o female with ESRD and HFpEF who presented with acute on chronic lumbar back pain.  Patient has been evaluated 8 times in the past month for these problem. Hospitalization is complicated by discordant goals of care between the patient and the family. After consultation with the Ethic committee yesterday, the patient was given IM haldol and take for HD.   She is alert and back to baseline this AM. The hope is for palliative care to have a goals of care discussion with the patient and her family to align our treatment strategies.   Bilateral Lumbar Back Pain: Likely muscular skeletal. patient denies any pain today If more pain, plan will be to escalate medications once family  meeting/GOC discussion is complete.  - PT recommends SNF  Right Renal Mass - CT abdomen with "Intermediate density mass of the right upper pole kidney, measuring 2.8 x 2.1 cm." - Getting CT abdomen with contrast to better assess - May need urology, outpatient vs inpatient   ESRD on HD Received HD after getting IM haldol  for headache, per ethic committee decision yesterday.  How ever patient is declining HD again today while she is alert and oriented.  -Nephrology is onboard. As patient declines HD, palliative care saw her again and still working with family to decide about goal of care.    Diet: Renal  VTE ppx: SCDs  CODE STATUS: DNR IV fluid: None  Dispo: Anticipated discharge in approximately 2-3 days [name], MD 05/11/2018, 5:17 AM Pager: [phone]

## 2018-05-11 NOTE — Plan of Care (Signed)
  Problem: Health Behavior/Discharge Planning: Goal: Ability to manage health-related needs will improve Outcome: Progressing   Problem: Clinical Measurements: Goal: Ability to maintain clinical measurements within normal limits will improve Outcome: Progressing   Problem: Clinical Measurements: Goal: Respiratory complications will improve Outcome: Progressing   

## 2018-05-12 ENCOUNTER — Inpatient Hospital Stay (HOSPITAL_COMMUNITY): Payer: Medicare Other

## 2018-05-12 ENCOUNTER — Ambulatory Visit: Payer: Self-pay

## 2018-05-12 MED ORDER — CHLORHEXIDINE GLUCONATE CLOTH 2 % EX PADS: 6.0000 | MEDICATED_PAD | Freq: Every day | CUTANEOUS | Status: DC

## 2018-05-12 MED ORDER — IOHEXOL 300 MG/ML  SOLN
100.0000 mL | Freq: Once | INTRAMUSCULAR | Status: AC | PRN
  Administered 2018-05-12: 100 mL via INTRAVENOUS

## 2018-05-12 NOTE — Progress Notes (Addendum)
[facility] Progress Note   Dialysis Orders: MWF [facility] 4 hr 300/600 - noncompliant - never ever runs full treatment and misses multiple treatments EDW 59 3 K 2.5 Ca no heparin Calcitriol 1.75 Mircera 200 q 2 weeks venofer 50 per week  Assessment/Plan: 1.Altered mental status --. Has baseline dementia - confusion and agitation has escalated. Working on goals of care. 2. ESRD- MWF - yelling much of treatment yesterday - see notes - dialysis terminated early due to infiltration from arm movement. Plan again Wed.- reduce time to 3.5 hours - consider doing this if d/c since never runs this long 3. Anemia- hgb 8.9 >9.5 - last outpatient Mircera 200 10/14 - redosed Monday 4. Secondary hyperparathyroidism- calcitriol P up some - watch trend and resume binder if this persists- previously on velphoro and has difficulty chewing per g'daughter 5.HTN/volume- BP a little high review of outpatient records indicate she has been below edw for some time - post wt 55.110/21 which was the last time she had outpatient HD-net UF 2 L 11/2 net UF 1 L 11/4 - no weights-needs weights 6. Nutrition- alb 2.4 regular diet + supplements-- progressive weight loss 7. Low back pain -probably newly dc renal cell right renal pelvis - noted on lumbar CT- for abdominal CT with oral and IV contrast - has contrast at bedside 8. Goals of care/Ethics consult  Has never had good dialysis attendance - literally signs off nearly every treatment and frequently skips treatments and this has worsened recently. Sedation with haldol is not an option.  Patient wishes dialysis on her own terms which unfortunately leads to marginal outcomes at best. Appreciated palliative care's extensive discussions with patient and daughter.  DNR status. Had an extensive  conversation with granddaughter explaining that SNF/cotninuing dialysis will likely prolong her life but it is not likely to improve the quality.   Appears to be heading  towards SNF placement.  Now DNR  Myriam Jacobson, PA-C [facility] Beeper [phone] 05/12/2018,10:24 AM  LOS: 2 days   Subjective:   Per nursing, HD terminated 1.5 hours earlier yesterday due to infiltration due to patient persistently moving are during treatment.  Yelling out a fair amount during treatment wanting to come off dialysis..  Well oriented I.e recited all the correct answers before I asked the questions and also asking her granddaughter, [name] to take her to the bank.  Objective Vitals:   05/11/18 1428 05/11/18 1650 05/11/18 2137 05/12/18 0420  BP: (!) 154/62 (!) 140/59 131/70 139/65  Pulse: 88 91 88 84  Resp: 16 (!) 21 18 16   Temp: 97.9 F (36.6 C) 98.1 F (36.7 C) 98 F (36.7 C) 98.4 F (36.9 C)  TempSrc: Oral Oral Oral   SpO2:  100% 99% 100%   Physical Exam General: calm NAD  Heart: RRR Lungs: grossly clear Abdomen: soft NT Extremities: no LE edema Dialysis Access:  Left upper AVF - skin darkened - not sure if all of this is new - + bruit   Additional Objective Labs: Basic Metabolic Panel: Recent Labs  Lab 05/09/18 1614 05/10/18 0553 05/11/18 1203  NA 136 137 137  K 4.0 3.8 3.9  CL 100 99 103  CO2 23 26 25   GLUCOSE 90 79 131*  BUN 62* 31* 45*  CREATININE 9.79* 6.26* 7.73*  CALCIUM 8.9 8.9 9.1  PHOS 7.1* 4.9* 5.9*   Liver Function Tests: Recent Labs  Lab 05/09/18 1614 05/10/18 0553 05/11/18 1203  ALBUMIN 2.3* 2.4* 2.2*  No results for input(s): LIPASE, AMYLASE in the last 168 hours. CBC: Recent Labs  Lab 05/06/18 2208 05/08/18 1246 05/09/18 1614 05/11/18 1203  WBC 10.6* 9.0 9.5 7.6  NEUTROABS 8.6*  --   --   --   HGB 10.5* 8.9* 9.4* 9.5*  HCT 34.7* 30.3* 32.0* 31.3*  MCV 93.0 92.1 93.0 92.3  PLT 245 223 229 216   Blood Culture    Component Value Date/Time   SDES BLOOD RIGHT WRIST 04/22/2018 1800   SPECREQUEST  04/22/2018 1800    BOTTLES DRAWN AEROBIC ONLY Blood Culture adequate volume   CULT  04/22/2018  1800    NO GROWTH 5 DAYS Performed at [facility], [place] [address], [place], [place] [address]    REPTSTATUS 04/27/2018 FINAL 04/22/2018 1800    Cardiac Enzymes: No results for input(s): CKTOTAL, CKMB, CKMBINDEX, TROPONINI in the last 168 hours. CBG: No results for input(s): GLUCAP in the last 168 hours. Iron Studies: No results for input(s): IRON, TIBC, TRANSFERRIN, FERRITIN in the last 72 hours. Lab Results  Component Value Date   INR 1.13 03/29/2018   INR 1.03 03/06/2018   INR 1.30 02/17/2017   Studies/Results: No results found. Medications:  . calcitRIOL  1.75 mcg Oral Q M,W,F-HD  . Chlorhexidine Gluconate Cloth  6 each Topical Q0600  . darbepoetin (ARANESP) injection - DIALYSIS  200 mcg Intravenous Q Mon-HD  . feeding supplement (NEPRO CARB STEADY)  237 mL Oral Q24H  . feeding supplement (PRO-STAT SUGAR FREE 64)  30 mL Oral BID  . heparin  5,000 Units Subcutaneous Q8H  . lidocaine  1 patch Transdermal Q24H  . pantoprazole  40 mg Oral Daily

## 2018-05-12 NOTE — Progress Notes (Signed)
  Date: 05/12/2018  Patient name: Nancy [name]  Medical record number: [id number]  Date of birth: [birth year]   I have seen and evaluated this patient and I have discussed the plan of care with the house staff. Please see their note for complete details. I concur with their findings with the following additions/corrections:   Again had difficulty completing dialysis yesterday, treatment was terminated after only about 1.5 hours.  Able to complete CT with IV contrast today, unfortunately did not further clarify the etiology of her renal mass.  Only other alternative would be MRI, although MRI without contrast would likely be of little utility.  Met with both her sister and her daughter, [name] this afternoon.  Discussed overall plan and goals of care, at this time they would like to plan to get her to a SNF and continue dialysis as best she can, with the recognition that her limited ability to complete dialysis treatments severely limits her possibility for improvement and overall life expectancy.  [name], M.D., Ph.D. 05/12/2018, 5:47 PM

## 2018-05-12 NOTE — Progress Notes (Signed)
   Subjective: Mr. Miltenberger was seen and evaluated today.  Her sister is in the room.  Patient could not complete hemodialysis yesterday due to agitation. She is a sleeping when we saw her.  When called her, answers briefly.  He does not have any complaint.  No chest pain, no shortness of breath, no back pain today.  Objective:  Vital signs in last 24 hours: Vitals:   05/11/18 1428 05/11/18 1650 05/11/18 2137 05/12/18 0420  BP: (!) 154/62 (!) 140/59 131/70 139/65  Pulse: 88 91 88 84  Resp: 16 (!) 21 18 16   Temp: 97.9 F (36.6 C) 98.1 F (36.7 C) 98 F (36.7 C) 98.4 F (36.9 C)  TempSrc: Oral Oral Oral   SpO2:  100% 99% 100%   Physical exam: General: Patient sleeping, when called, answers to questions briefly Head: Normocephalic and atraumatic CV: RRR, 3/6 systolic murmur Lungs: Examined anterior lateral chest, patient was sleeping.  No crackle Abdomen: Soft, nontender, BS are present Extremities: No lower extremity edema, pulses are palpable bilaterally Neurology: No motor or sensory deficit  Assessment/Plan:  Principal Problem:   Back pain Active Problems:   Essential hypertension   GERD   End stage renal disease ([place])   Palliative care by specialist   Right renal mass   DNR (do not resuscitate) discussion   Nancy [name] is a 77 y.o female with ESRD and HFpEF who presented with acute on chronic lumbar back pain.  Patient has been evaluated 8 times in the past month for these problem, found to have right renal mass on CT scan  Goal of care: Patient again had difficulty completing her dialysis yesterday and 1.5hours dialysis remained when terminated due to patient moving her arm and is screaming. However she was able to complete CT scanning with IV contrast today. Goal of care discussed with patient's sister and daughter.  At this time they are interested to get the patient to SNF and continue hemodialysis as best she can. They were also informed that further imaging such  as MRI with contrast will be required to clarify the etiology of patient's renal mass as CT was contrast was not completely clarifying. Talked to Education officer, museum, she was a started the process for SNF placement  -May discharge with stable and SNF placement  Bilateral Lumbar Back Pain: Likely muscular skeletal. patient denies any pain today  Right Renal Mass - CT abdomen with "Intermediate density mass of the right upper pole kidney, measuring 2.8 x 2.1 cm." CT abdomen with contrast performed but did not clarify the etiology -May need MRI with IV contrast - May need urology, outpatient vs inpatient    ESRD on HD Patient agreed to get hemodialysis yesterday.  However were not able to complete that due to agitation.  At 1.5 however remained on hemodialysis terminated. Asymptomatic today, no volume overload on exam, potassium normal, bicarb normal Goal of care discussed with her daughter, talking about she is dependant to HD and refusing HD, make her situation woerse  Diet: Renal  VTE ppx: SCDs  CODE STATUS: DNR IV fluid: None Dispo: Anticipated discharge in approximately 1-2 days  [name], MD 05/12/2018, 6:33 AM Pager: [phone]

## 2018-05-12 NOTE — NC FL2 (Signed)
[place] LEVEL OF CARE SCREENING TOOL     IDENTIFICATION  Patient Name: Nancy [name] Birthdate: [birth year] Sex: female Admission Date (Current Location): 05/08/2018  [place] and [place] Number:  [name] [id number] [place] and Address:  [facility] [place]. [facility], [place] [address], [place], [place] [address]      Provider Number: [id number]  Attending Physician Name and Address:  Oda Kilts, MD  Relative Name and Phone Number:  [name], daughter - [phone]    Current Level of Care: Hospital Recommended Level of Care: [place] Prior Approval Number:    Date Approved/Denied:   PASRR Number: [id number](Eff. 05/05/18)  Discharge Plan: SNF    Current Diagnoses: Patient Active Problem List   Diagnosis Date Noted  . DNR (do not resuscitate) discussion   . Right renal mass   . Back pain 05/08/2018  . Chronic cough 04/17/2018  . Acute gastritis without hemorrhage   . Duodenitis with bleeding   . ESRD (end stage renal disease) ([place])   . Papular lichenification 99/37/1696  . Acute on chronic blood loss anemia 03/06/2018  . Palliative care by specialist   . Protein-calorie malnutrition, severe 01/12/2018  . Midline thoracic back pain 12/23/2017  . Symptomatic anemia 02/17/2017  . Secondary hyperparathyroidism of renal origin ([place]) 01/15/2017  . Mitral valve annular calcification   . Dizziness 12/08/2016  . Angina at rest [facility])   . Goals of care, counseling/discussion   . Aortic stenosis 05/21/2016  . Anemia associated with chronic renal failure 02/01/2016  . Atherosclerosis of aorta ([place]) 01/11/2015  . End stage renal disease ([place]) 02/04/2013  . Non-intractable vomiting with nausea 08/17/2012  . Glaucoma 03/18/2012  . Health care maintenance 09/17/2011  . Osteoarthrosis involving lower leg 10/31/2008  . History of CVA (cerebrovascular accident) 01/28/2008  . Hyperlipidemia 02/13/2007  . PULMONARY NODULES  01/12/2007  . Left ventricular hypertrophy 09/02/2006  . Essential hypertension 08/11/2006  . GERD 08/11/2006    Orientation RESPIRATION BLADDER Height & Weight     Self, Time, Place  Normal Continent Weight: (bedscale broke) Height:     BEHAVIORAL SYMPTOMS/MOOD NEUROLOGICAL BOWEL NUTRITION STATUS      Continent Diet(Regular diet, 1200 mL fluid restriction)  AMBULATORY STATUS COMMUNICATION OF NEEDS Skin   Limited Assist Verbally Normal                       Personal Care Assistance Level of Assistance  Bathing, Feeding, Dressing(OT evaluation pending) Bathing Assistance: Limited assistance Feeding assistance: Limited assistance Dressing Assistance: Limited assistance     Functional Limitations Info  Sight, Hearing, Speech Sight Info: Impaired Hearing Info: Adequate Speech Info: Adequate    SPECIAL CARE FACTORS FREQUENCY  PT (By licensed PT), OT (By licensed OT)     PT Frequency: PT at SNF Eval and Treat; A minimum of 2X per week recommended during acute inpatient stay OT Frequency: OT at SNF Eval and Treat; (OT eval pending)            Contractures Contractures Info: Not present    Additional Factors Info  Code Status, Allergies Code Status Info: DNR Allergies Info: Hydrocodone, Tape           Current Medications (05/12/2018):  This is the current hospital active medication list Current Facility-Administered Medications  Medication Dose Route Frequency Provider Last Rate Last Dose  . acetaminophen (TYLENOL) tablet 650 mg  650 mg Oral Q6H PRN Kalman Shan Ratliff, DO   650  mg at 05/11/18 2038   Or  . acetaminophen (TYLENOL) suppository 650 mg  650 mg Rectal Q6H PRN Kalman Shan Ratliff, DO      . calcitRIOL (ROCALTROL) capsule 1.75 mcg  1.75 mcg Oral Q M,W,F-HD Edrick Oh, MD   1.75 mcg at 05/11/18 1403  . Chlorhexidine Gluconate Cloth 2 % PADS 6 each  6 each Topical Q0600 Alric Seton, PA-C      . Darbepoetin Alfa (ARANESP) injection 200  mcg  200 mcg Intravenous Q Mon-HD Alric Seton, PA-C   200 mcg at 05/11/18 1410  . feeding supplement (NEPRO CARB STEADY) liquid 237 mL  237 mL Oral Q24H Edrick Oh, MD   237 mL at 05/11/18 2042  . feeding supplement (PRO-STAT SUGAR FREE 64) liquid 30 mL  30 mL Oral BID Edrick Oh, MD   30 mL at 05/11/18 2040  . heparin injection 5,000 Units  5,000 Units Subcutaneous Q8H Valinda Party, DO   5,000 Units at 05/11/18 2039  . lidocaine (LIDODERM) 5 % 1 patch  1 patch Transdermal Q24H Valinda Party, DO   1 patch at 05/08/18 1641  . oxyCODONE (Oxy IR/ROXICODONE) immediate release tablet 2.5 mg  2.5 mg Oral Q4H PRN Ina Homes, MD   2.5 mg at 05/12/18 1124  . pantoprazole (PROTONIX) EC tablet 40 mg  40 mg Oral Daily Hoffman, Jessica Ratliff, DO   40 mg at 05/11/18 1002  . senna-docusate (Senokot-S) tablet 1 tablet  1 tablet Oral QHS PRN Valinda Party, DO         Discharge Medications: Please see discharge summary for a list of discharge medications.  Relevant Imaging Results:  Relevant Lab Results:   Additional Information [id number]. Dialysis patient MWF at [facility], [place], [place]

## 2018-05-12 NOTE — Clinical Social Work Note (Signed)
Clinical Social Work Assessment  Patient Details  Name: Nancy [name] MRN: [id number] Date of Birth: [birth year]  Date of referral:  05/12/18               Reason for consult:  Facility Placement, Discharge Planning                Permission sought to share information with:  Family Supports Permission granted to share information::  No(Patient not oriented to situation)  Name::        Agency::     Relationship::     Contact Information:     Housing/Transportation Living arrangements for the past 2 months:  Apartment Source of Information:  Adult Children(Daughter Nancy [name]) Patient Interpreter Needed:  None Criminal Activity/Legal Involvement Pertinent to Current Situation/Hospitalization:  No - Comment as needed Significant Relationships:  Adult Children, Other Family Members Lives with:  Adult Children Do you feel safe going back to the place where you live?  No(Patient expressed agreement to ST rehab with Palliative staff on 11/4 and daughter expressed agreement with CSW on 11/5) Need for family participation in patient care:  Yes (Comment)  Care giving concerns:  Patient's daughter, Nancy [name] reported that she has been staying with her mom for about a month now due to a decline in her health, and per daughter she will continue to stay with her after rehab.  Social Worker assessment / plan:  CSW talked with daughter at the bedside regarding discharge planning for patient. When CSW entered room, Dr. Rebeca [name] was talking with patient and he requested that Nancy [name] and CSW intern remain in the room as he talked with patient's daughter. Nancy [name] was lying in bed with her eyes closed, however during the visit she opened her eyes but remained quiet.   Daughter reported that her mother uses Nancy [name] transportation to dialysis ([place] transportation). Daughter expressed agreement with ST rehab and mother's choice of Nancy [name], and explained that a family member had been there. Ms.  Redmond [name] reported that her mother has aide services 2 hours a day, and the other times she is alone, as she works at the [employer] and her granddaughter works at [employer].   Employment status:  Disabled (Comment on whether or not currently receiving Disability) Insurance information:  Medicare, Medicaid [place] PT Recommendations:  [place] / Referral to community resources:  [place]  Patient/Family's Response to care: Daughter expressed no concerns regarding her mother's care during hospitalization.  Patient/Family's Understanding of and Emotional Response to Diagnosis, Current Treatment, and Prognosis: Daughter appeared to understand her mother's need for nursing care and rehab before returning home.  Emotional Assessment Appearance:  Appears stated age Attitude/Demeanor/Rapport:  Other(Patient was awake but quiet during CSW visit) Affect (typically observed):  Quiet Orientation:  Oriented to Self, Oriented to Place, Oriented to  Time Alcohol / Substance use:  Tobacco Use, Alcohol Use, Illicit Drugs(Patient denies smoking, drinking and IV drug use) Psych involvement (Current and /or in the community):  No (Comment)  Discharge Needs  Concerns to be addressed:  Discharge Planning Concerns Readmission within the last 30 days:  Yes Current discharge risk:  None Barriers to Discharge:  Continued Medical Work up   [employer] Nancy [name], Nancy [name] 05/12/2018, 5:13 PM

## 2018-05-13 LAB — CBC
HCT: 31.8 % — ABNORMAL LOW (ref 36.0–46.0)
Hemoglobin: 9.5 g/dL — ABNORMAL LOW (ref 12.0–15.0)
MCH: 27.6 pg (ref 26.0–34.0)
MCHC: 29.9 g/dL — ABNORMAL LOW (ref 30.0–36.0)
MCV: 92.4 fL (ref 80.0–100.0)
Platelets: 206 10*3/uL (ref 150–400)
RBC: 3.44 MIL/uL — ABNORMAL LOW (ref 3.87–5.11)
RDW: 18.4 % — ABNORMAL HIGH (ref 11.5–15.5)
WBC: 6.6 10*3/uL (ref 4.0–10.5)
nRBC: 0 % (ref 0.0–0.2)

## 2018-05-13 LAB — RENAL FUNCTION PANEL
Albumin: 2.1 g/dL — ABNORMAL LOW (ref 3.5–5.0)
Anion gap: 10 (ref 5–15)
BUN: 36 mg/dL — ABNORMAL HIGH (ref 8–23)
CO2: 26 mmol/L (ref 22–32)
Calcium: 9 mg/dL (ref 8.9–10.3)
Chloride: 96 mmol/L — ABNORMAL LOW (ref 98–111)
Creatinine, Ser: 6.03 mg/dL — ABNORMAL HIGH (ref 0.44–1.00)
GFR calc Af Amer: 7 mL/min — ABNORMAL LOW (ref >60)
GFR calc non Af Amer: 6 mL/min — ABNORMAL LOW (ref >60)
Glucose, Bld: 91 mg/dL (ref 70–99)
Phosphorus: 5.6 mg/dL — ABNORMAL HIGH (ref 2.5–4.6)
Potassium: 3.7 mmol/L (ref 3.5–5.1)
Sodium: 132 mmol/L — ABNORMAL LOW (ref 135–145)

## 2018-05-13 MED ORDER — PENTAFLUOROPROP-TETRAFLUOROETH EX AERO: 1.0000 "application " | INHALATION_SPRAY | CUTANEOUS | Status: DC | PRN

## 2018-05-13 MED ORDER — LIDOCAINE HCL (PF) 1 % IJ SOLN: 5.0000 mL | INTRAMUSCULAR | Status: DC | PRN

## 2018-05-13 MED ORDER — CALCITRIOL 0.5 MCG PO CAPS
ORAL_CAPSULE | ORAL | Status: AC
  Filled 2018-05-13: qty 3

## 2018-05-13 MED ORDER — CALCITRIOL 0.25 MCG PO CAPS
ORAL_CAPSULE | ORAL | Status: AC
  Filled 2018-05-13: qty 1

## 2018-05-13 MED ORDER — SODIUM CHLORIDE 0.9 % IV SOLN: 100.0000 mL | INTRAVENOUS | Status: DC | PRN

## 2018-05-13 MED ORDER — LIDOCAINE-PRILOCAINE 2.5-2.5 % EX CREA: 1.0000 "application " | TOPICAL_CREAM | CUTANEOUS | Status: DC | PRN

## 2018-05-13 MED ORDER — OXYCODONE HCL 5 MG PO TABS
ORAL_TABLET | ORAL | Status: AC
  Filled 2018-05-13: qty 1

## 2018-05-13 MED ORDER — OXYCODONE-ACETAMINOPHEN 5-325 MG PO TABS
ORAL_TABLET | ORAL | Status: AC
  Filled 2018-05-13: qty 1

## 2018-05-13 NOTE — Progress Notes (Signed)
   Subjective: Nancy [name] is doing well this afternoon. She is resting comfortably in bed. She is amendable to going to a rehab facility to get stronger. Asks why she is so weak and we discussed that it is likely multifactorial in nature relating to her back pain, limited mobility, and missed HD. She voices understanding. Discussed plans with patient and daughter at bedside. All questions and concerns addressed.   Objective: Vital signs in last 24 hours: Vitals:   05/13/18 1030 05/13/18 1100 05/13/18 1130 05/13/18 1145  BP: (!) 110/59 (!) 148/68 (!) 148/60 (!) 140/50  Pulse: 83 88 88 90  Resp:    18  Temp:    98 F (36.7 C)  TempSrc:    Oral  SpO2:    100%  Weight:    55 kg   General: Elderly female in no acute distress Pulm: Good air movement with no wheezing or crackles  CV: RRR, crescendo-decrescendo systolic murmur  Extremities: No LE edema  Neuro: Alert and oriented x 3  Assessment/Plan:  Nancy [name] is a 77 y.o female with ESRD and HFpEF who presented with acute on chronic lumbar back pain that was affecting her ability, and her family's ability, to care for herself. She has frequent admissions for HD noncompliance and her family is concerned she is unable to care for herself.   Goals of Care  - Frequently misses HD and signs off of HD  - Palliative care has evaluated the patient and spoke with family  - Continuing HD at this point and plan for discharge to SNF  Bilateral Lumbar Back Pain  - Likely MSK in etiology. Continuing lidocaine patches and Oxy IR  Right Renal Mass - CT abdomen with "Intermediate density mass of the right upper pole kidney, measuring 2.8 x 2.1 cm." Unfortunately CT abdomen with contrast did not help [place] to better characterize the lesion  - Subsequent work-up can be pursued as an outpatient   ESRD on HD - Short run of HD today. Discontinued because patient was pulling at her line - Nephrology onboard, appreciate their recommendations  Diet:  Renal  VTE ppx: SCDs  CODE STATUS: DNR  Dispo: Anticipated discharge once SNF bed is available. Patient is medically stable.  Ina Homes, MD 05/13/2018, 1:53 PM

## 2018-05-13 NOTE — Evaluation (Signed)
Occupational Therapy Evaluation Patient Details Name: Nancy [name] MRN: [id number] DOB: 09/28/1940 Today's Date: 05/13/2018    History of Present Illness 77 year old female who presented to the internal medicine clinic for acute on chronic back pain with associated leg weakness. PMH significant of end-stage renal disease on hemodialysis MWF, GERD, CVA, hypertension and  heart failure with preserved ejection fraction.   Clinical Impression   PTA, pt was living alone and was independent. Pt currently requiring Min A for UB ADLs, Max A for LB ADLs, Mod A for functional mobility with RW. Pt presenting with poor balance, strength, and activity tolerance and fatigued quickly. Pt would benefit from further acute OT to facilitate safe dc. Recommend dc to SNF for further OT to optimize safety, independence with ADLs, and return to PLOF.      Follow Up Recommendations  SNF;Supervision/Assistance - 24 hour    Equipment Recommendations  Other (comment)(Defer to next venue)    Recommendations for Other Services PT consult     Precautions / Restrictions Precautions Precautions: Fall Precaution Comments: Attempted to educate on back precautions for comfort but unsuccessful.  Restrictions Weight Bearing Restrictions: No      Mobility Bed Mobility Overal bed mobility: Needs Assistance Bed Mobility: Supine to Sit     Supine to sit: Mod assist     General bed mobility comments: Mod A to bring BLEs over EOB and then initate trunk elevation  Transfers Overall transfer level: Needs assistance Equipment used: Rolling walker (2 wheeled) Transfers: Sit to/from [facility] Sit to Stand: Mod assist Stand pivot transfers: Mod assist       General transfer comment: Mod A to power up into standing and then maintain balance while pivoting to recliner.    Balance Overall balance assessment: Needs assistance Sitting-balance support: Feet supported;No upper extremity  supported Sitting balance-Leahy Scale: Fair     Standing balance support: Bilateral upper extremity supported;During functional activity Standing balance-Leahy Scale: Poor Standing balance comment: Reliant on physical A and UE support                           ADL either performed or assessed with clinical judgement   ADL Overall ADL's : Needs assistance/impaired Eating/Feeding: Set up;Supervision/ safety;Sitting   Grooming: Set up;Supervision/safety;Sitting   Upper Body Bathing: Minimal assistance;Sitting   Lower Body Bathing: Maximal assistance;Sit to/from stand   Upper Body Dressing : Minimal assistance;Sitting   Lower Body Dressing: Maximal assistance;Sit to/from stand Lower Body Dressing Details (indicate cue type and reason): Pt stating she is unable to bend forward to adjust sock due to back pain Toilet Transfer: Moderate assistance;Stand-pivot;RW(simulated to recliner) Toilet Transfer Details (indicate cue type and reason): Mod A for balance. presenting with posterior and right lateral lean. Requiring assistance to manage RW         Functional mobility during ADLs: Moderate assistance;Rolling walker(Stand pivot only) General ADL Comments: Pt presenting with decreased balance, strength, and activity tolerance. fatigued quickly     Vision         Perception     Praxis      Pertinent Vitals/Pain Pain Assessment: Faces Faces Pain Scale: Hurts little more Pain Location: Back Pain Descriptors / Indicators: Discomfort Pain Intervention(s): Monitored during session;Repositioned;Limited activity within patient's tolerance     Hand Dominance Right   Extremity/Trunk Assessment Upper Extremity Assessment Upper Extremity Assessment: Generalized weakness   Lower Extremity Assessment Lower Extremity Assessment: Generalized weakness   Cervical /  Trunk Assessment Cervical / Trunk Assessment: Kyphotic   Communication Communication Communication: No  difficulties   Cognition Arousal/Alertness: Awake/alert Behavior During Therapy: Flat affect Overall Cognitive Status: Impaired/Different from baseline Area of Impairment: Attention;Memory;Following commands;Safety/judgement;Awareness;Problem solving                   Current Attention Level: Sustained Memory: Decreased short-term memory Following Commands: Follows one step commands inconsistently;Follows one step commands with increased time Safety/Judgement: Decreased awareness of safety;Decreased awareness of deficits Awareness: Intellectual Problem Solving: Slow processing;Decreased initiation;Difficulty sequencing;Requires verbal cues;Requires tactile cues General Comments: Pt not recalling who her daughter who was present at bedside. Requiring increased cues and time throughout session   General Comments  Daughter, susan, present at begining of session    Exercises     Shoulder Instructions      Home Living Family/patient expects to be discharged to:: Private residence Living Arrangements: Alone Available Help at Discharge: Family;Personal care attendant;Other (Comment) Type of Home: Apartment Home Access: Level entry     Home Layout: One level     Bathroom Shower/Tub: Teacher, early years/pre: Standard     Home Equipment: Cane - single point;Grab bars - tub/shower;Walker - 2 wheels   Additional Comments: Above information was taken from a previous admission in June. Unsure if all is still accurate. Pt not willing to answer questions.       Prior Functioning/Environment Level of Independence: Independent with assistive device(s)        Comments: Pt reports she was using a walker at home.         OT Problem List: Decreased activity tolerance;Decreased strength;Decreased range of motion;Impaired balance (sitting and/or standing);Decreased knowledge of use of DME or AE;Decreased knowledge of precautions;Pain;Decreased safety awareness;Decreased  cognition      OT Treatment/Interventions: Self-care/ADL training;Therapeutic exercise;Energy conservation;DME and/or AE instruction;Therapeutic activities;Patient/family education    OT Goals(Current goals can be found in the care plan section) Acute Rehab OT Goals Patient Stated Goal: Daughter "Go to rehab and get stronger" OT Goal Formulation: With family Time For Goal Achievement: 05/27/18 Potential to Achieve Goals: Good  OT Frequency: Min 2X/week   Barriers to D/C:            Co-evaluation              AM-PAC PT "6 Clicks" Daily Activity     Outcome Measure Help from another person eating meals?: A Little Help from another person taking care of personal grooming?: A Little Help from another person toileting, which includes using toliet, bedpan, or urinal?: A Lot Help from another person bathing (including washing, rinsing, drying)?: A Lot Help from another person to put on and taking off regular upper body clothing?: A Little Help from another person to put on and taking off regular lower body clothing?: A Lot 6 Click Score: 15   End of Session Equipment Utilized During Treatment: Gait belt;Rolling walker Nurse Communication: Mobility status;Precautions  Activity Tolerance: Patient limited by fatigue Patient left: in chair;with call bell/phone within reach;with chair alarm set  OT Visit Diagnosis: Unsteadiness on feet (R26.81);Other abnormalities of gait and mobility (R26.89);Muscle weakness (generalized) (M62.81);Pain Pain - part of body: (Back)                Time: 1610-9604 OT Time Calculation (min): 17 min Charges:  OT General Charges $OT Visit: 1 Visit OT Evaluation $OT Eval Moderate Complexity: [place], OTR/L Acute Rehab Pager: [phone] Office: [place]  Brazil Voytko 05/13/2018, 3:56 PM

## 2018-05-13 NOTE — Progress Notes (Signed)
  Date: 05/13/2018  Patient name: Nancy [name]  Medical record number: [id number]  Date of birth: [birth year]   I have seen and evaluated this patient and I have discussed the plan of care with the house staff. Please see their note for complete details. I concur with their findings with the following additions/corrections:   Unfortunately, he had again unable to complete a full dialysis treatment, was pulling at the access site and asked to be taken off.  This remains the biggest threat to her health.  She will benefit from continued discussions of goals of care, as I agree with nephrology that the current plan to discharge her to SNF with continued attempts at dialysis will keep her alive but is unlikely to improve her quality of life.  Unfortunately, CT with contrast unable to further elucidate the etiology of her newly diagnosed renal mass.  Lack of enhancement somewhat decreases our suspicion for renal cell carcinoma, but the differential remains broad and it still includes malignancy.  Unclear how much further evaluation will affect her overall management plan.  MRI would be beneficial but gadolinium contrast is risky for her.  We will defer further work-up for now and discuss with her PCP.  Planning for discharge soon to SNF.  [name], M.D., Ph.D. 05/13/2018, 3:53 PM

## 2018-05-13 NOTE — Progress Notes (Signed)
[name] Progress Note   Dialysis Orders: MWF [facility] 4 hr 300/600 - noncompliant - never ever runs full treatment and misses multiple treatments EDW 59 3 K 2.5 Ca no heparin Calcitriol 1.75 Mircera 200 q 2 weeks venofer 50 per week  Assessment/Plan: 1.Altered mental status --. Has baseline dementia - confusion and agitation has escalated. Working on goals of care. 2. ESRD- MWF - HD currently - Qb 300 but this is due to her arm position - per RN the AV access is ok.  3. Anemia- hgb 8.9 >9.5 - last outpatient Mircera 200 10/14 - redosed Monday 4. Secondary hyperparathyroidism- calcitriol P up some - watch trend and resume binder if this persists- previously on velphoro and has difficulty chewing per g'daughter 5.HTN/volume- BP fine in past 24h.  She's been < EDW lately at HD - post wt 55.110/21 which was the last time she had outpatient HD-net UF 2 L 11/2 net UF 1 L 11/4.  11/6 wt is 56.1kg. UFG today 2.5L   Continue daily weights.  6. Nutrition- alb 2.4 regular diet + supplements-- progressive weight loss 7. Low back pain -probably newly dc renal cell right renal pelvis - noted on lumbar CT- f/u CT a/p with IV contrast didn't help elicidate.  Rec MRI w/w/o but given ESRD gadolinium not safe.   8. Goals of care/Ethics consult  Per Nancy Hailey PA --> Has never had good dialysis attendance - literally signs off nearly every treatment and frequently skips treatments and this has worsened recently. Sedation with haldol is not an option.  Patient wishes dialysis on her own terms which unfortunately leads to marginal outcomes at best. Appreciated palliative care's extensive discussions with patient and daughter.  DNR status. Had an extensive  conversation with granddaughter explaining that SNF/cotninuing dialysis will likely prolong her life but it is not likely to improve the quality.   Appears to be heading towards SNF placement.  Now DNR  Nancy Hick MD [facility] Pager [phone]  Subjective:   Seen on HD - observed asking RN when she can come off HD but when I saw her she was sleeping comfortably.  She has 2:10h left in treatment.  Objective Vitals:   05/13/18 0925 05/13/18 0930 05/13/18 1000 05/13/18 1030  BP: (!) 146/52 (!) 144/67 (!) 155/70 (!) 110/59  Pulse: 90 86 91 83  Resp: 18 18    Temp:      TempSrc:      SpO2:      Weight:       Physical Exam General: sleeping comfortably  Heart: HR 82 Lungs: normal WOB Abdomen: deferred Extremities: no LE edema Dialysis Access:  Left upper AVF with needles in Qb 300   Additional Objective Labs: Basic Metabolic Panel: Recent Labs  Lab 05/10/18 0553 05/11/18 1203 05/13/18 0936  NA 137 137 132*  K 3.8 3.9 3.7  CL 99 103 96*  CO2 26 25 26   GLUCOSE 79 131* 91  BUN 31* 45* 36*  CREATININE 6.26* 7.73* 6.03*  CALCIUM 8.9 9.1 9.0  PHOS 4.9* 5.9* 5.6*   Liver Function Tests: Recent Labs  Lab 05/10/18 0553 05/11/18 1203 05/13/18 0936  ALBUMIN 2.4* 2.2* 2.1*   No results for input(s): LIPASE, AMYLASE in the last 168 hours. CBC: Recent Labs  Lab 05/06/18 2208 05/08/18 1246 05/09/18 1614 05/11/18 1203 05/13/18 0937  WBC 10.6* 9.0 9.5 7.6 6.6  NEUTROABS 8.6*  --   --   --   --  HGB 10.5* 8.9* 9.4* 9.5* 9.5*  HCT 34.7* 30.3* 32.0* 31.3* 31.8*  MCV 93.0 92.1 93.0 92.3 92.4  PLT 245 223 229 216 206   Blood Culture    Component Value Date/Time   SDES BLOOD RIGHT WRIST 04/22/2018 1800   SPECREQUEST  04/22/2018 1800    BOTTLES DRAWN AEROBIC ONLY Blood Culture adequate volume   CULT  04/22/2018 1800    NO GROWTH 5 DAYS Performed at [facility], [place] [address], [place], [place] [address]    REPTSTATUS 04/27/2018 FINAL 04/22/2018 1800    Cardiac Enzymes: No results for input(s): CKTOTAL, CKMB, CKMBINDEX, TROPONINI in the last 168 hours. CBG: No results for input(s): GLUCAP in the last 168 hours. Iron Studies: No results for input(s): IRON, TIBC,  TRANSFERRIN, FERRITIN in the last 72 hours. Lab Results  Component Value Date   INR 1.13 03/29/2018   INR 1.03 03/06/2018   INR 1.30 02/17/2017   Studies/Results: Ct Abdomen W Contrast  Result Date: 05/12/2018 CLINICAL DATA:  Renal mass EXAM: CT ABDOMEN WITH CONTRAST TECHNIQUE: Multidetector CT imaging of the abdomen was performed using the standard protocol following bolus administration of intravenous contrast. CONTRAST:  147mL OMNIPAQUE IOHEXOL 300 MG/ML  SOLN COMPARISON:  100 cc Omnipaque 300 FINDINGS: Lower chest: Heart is enlarged. Coronary artery calcification is evident. Atherosclerotic calcification is noted in the wall of the thoracic aorta. Small bilateral pleural effusions are associated with dependent lower lobe atelectasis bilaterally. Hepatobiliary: Heterogeneous perfusion of liver parenchyma evident. Gallbladder surgically absent. No intrahepatic or extrahepatic biliary dilation. Pancreas: No focal mass lesion. No dilatation of the main duct. No intraparenchymal cyst. No peripancreatic edema. Spleen: No splenomegaly. No focal mass lesion. Adrenals/Urinary Tract: No adrenal nodule or mass. 2.8 x 2.3 cm lesion identified in the central aspect of the upper pole right kidney. This is a relatively homogeneous lesion, new since CT scan of 01/25/2018. On both today's study and the recent lumbar spine CT of 05/08/2018, the lesion shape suggests involvement of the upper pole collecting system. No overt hyperenhancement although the lesion is higher in attenuation than would be expected for a simple cyst. This may be related to focal caliceal dilatation with hemorrhage, obstructed calyx with infectious debris, or interval development of obstructing urothelial neoplasm. Left kidney atrophic without hydronephrosis. No evidence for hydroureter. The urinary bladder appears normal for the degree of distention. Stomach/Bowel: Stomach is nondistended. No gastric wall thickening. No evidence of outlet  obstruction. Duodenum is normally positioned as is the ligament of Treitz. No small bowel wall thickening. No small bowel dilatation. The terminal ileum is normal. The appendix is normal. No gross colonic mass. No colonic wall thickening. Diverticular changes are noted in the left colon without evidence of diverticulitis. Vascular/Lymphatic: There is abdominal aortic atherosclerosis without aneurysm. There is no gastrohepatic or hepatoduodenal ligament lymphadenopathy. No intraperitoneal or retroperitoneal lymphadenopathy. No pelvic sidewall lymphadenopathy. Other: Small volume intraperitoneal free fluid associated with diffuse body wall edema. Musculoskeletal: No worrisome lytic or sclerotic osseous abnormality. IMPRESSION: 1. 2.8 x 2.3 cm central lesion in the upper pole right kidney is new since 01/25/2018. This is relatively rapid interval appearance. Lesion had homogeneous increased attenuation on recent lumbar spine CT of 05/08/2018. On today's study the lesion remains homogeneous without hyperenhancement, but has attenuation higher than would be expected for a simple cyst and low level diffuse enhancement cannot be excluded. The lesion is probably a dilated upper pole collecting system or central cyst. The high attenuation could be related to  hemorrhage, infectious debris, or soft tissue in the setting of neoplasm such as transitional cell carcinoma. Papillary renal cell carcinoma would also be a consideration although this is typically relatively slow growing. MRI of the abdomen without and with contrast would be the study of choice to further characterize. Electronically Signed   By: Misty Stanley M.D.   On: 05/12/2018 14:33   Medications: . sodium chloride    . sodium chloride     . calcitRIOL  1.75 mcg Oral Q M,W,F-HD  . Chlorhexidine Gluconate Cloth  6 each Topical Q0600  . darbepoetin (ARANESP) injection - DIALYSIS  200 mcg Intravenous Q Mon-HD  . feeding supplement (NEPRO CARB STEADY)  237 mL  Oral Q24H  . feeding supplement (PRO-STAT SUGAR FREE 64)  30 mL Oral BID  . heparin  5,000 Units Subcutaneous Q8H  . lidocaine  1 patch Transdermal Q24H  . pantoprazole  40 mg Oral Daily

## 2018-05-13 NOTE — Clinical Social Work Placement (Addendum)
   CLINICAL SOCIAL WORK PLACEMENT  NOTE **05/13/18 - Patient will discharge to [facility] once Insurance Authorization received. Auth initiated 11/6**  Date:  05/13/2018  Patient Details  Name: Nancy [name] MRN: [id number] Date of Birth: 1941-06-16  Clinical Social Work is seeking post-discharge placement for this patient at the [place] level of care (*CSW will initial, date and re-position this form in  chart as items are completed):  Yes   Patient/family provided with [place] Work Department's list of facilities offering this level of care within the geographic area requested by the patient (or if unable, by the patient's family).  Yes   Patient/family informed of their freedom to choose among providers that offer the needed level of care, that participate in Medicare, Medicaid or managed care program needed by the patient, have an available bed and are willing to accept the patient.  Yes   Patient/family informed of [place]'s ownership interest in [facility] and [facility], as well as of the fact that they are under no obligation to receive care at these facilities.  PASRR submitted to EDS on       PASRR number received on       Existing PASRR number confirmed on 05/12/18     FL2 transmitted to all facilities in geographic area requested by pt/family on 05/12/18     FL2 transmitted to all facilities within larger geographic area on       Patient informed that his/her managed care company has contracts with or will negotiate with certain facilities, including the following:        Yes   Patient/family informed of bed offers received.  Patient chooses bed at [facility]  Physician recommends and patient chooses bed at      Patient to be transferred to [facility] on (Once authorization received).  Patient to be transferred to facility by Ambulance     Patient family notified on   of  transfer.  Name of family member notified:        PHYSICIAN      Additional Comment: 05/13/18 - MD contacted and updated regarding pending insurance authorization for [facility].     _______________________________________________ Sable Feil, LCSW 05/13/2018, 3:43 PM

## 2018-05-13 NOTE — Telephone Encounter (Signed)
Pt has been seen and was readm to [facility]

## 2018-05-13 NOTE — Care Management Note (Signed)
Case Management Note  Patient Details  Name: Nancy [name] MRN: [id number] Date of Birth: [birth year]  Subjective/Objective:  Pt presented for AMS hx ESRD- had been noncompliant with HD treatments. Palliative is following for disposition needs. CSW following for disposition needs- plan for SNF.                   Action/Plan: CM will continue to monitor for additional needs.   Expected Discharge Date:                  Expected Discharge Plan:  Skilled Nursing Facility  In-House Referral:  Clinical Social Work  Discharge planning Services  CM Consult  Post Acute Care Choice:  NA Choice offered to:  NA  DME Arranged:  N/A DME Agency:  NA  HH Arranged:  NA HH Agency:  NA  Status of Service:  Completed, signed off  If discussed at [place] of Stay Meetings, dates discussed:    Additional Comments:  Bethena Roys, RN 05/13/2018, 2:16 PM

## 2018-05-13 NOTE — Progress Notes (Signed)
Palliative: Mrs. Wambolt is now in HD and her grand daughter [name] is at bedside. We talk at length about Mrs. Beidler acute and chronic health problems including recent imaging for suspected renal cell carcinoma.  [name] endorses that she feels her grandmother is too weak for any surgery, but she would like to offer her choice about needle biopsy and future treatments.  We talked about how to make choices for loved ones, and that there may come a time where [place] and [name] must make choices for [name].  We talked about disposition to the SNF/rehab.  We also talked about rehospitalization, if/when Mrs. Principato gets sick again, becomes lethargic from not taking hemodialysis, do we bring her back to the hospital for emergency dialysis, or to we make sure she is comfortable, and let nature take its course.  [name] and Susie are considering these choices.  I see [name] later in the HD suite.  She has complained of pain and is resting comfortably after medication has been given by HD nurse.  At this point, she is resting, 2 hours into her treatment.  14 minutes Quinn Axe, NP Palliative Medicine Team [phone]

## 2018-05-13 NOTE — Progress Notes (Signed)
Will try again at another time.   05/13/18 1100  PT Visit Information  Last PT Received On 05/13/18  Reason Eval/Treat Not Completed Patient at procedure or test/unavailable    Mee Hives, PT MS Acute Rehab Dept. Number: [place]

## 2018-05-13 NOTE — Progress Notes (Signed)
OT cancellation    05/13/18 1000  OT Visit Information  Last OT Received On 05/13/18  Reason Eval/Treat Not Completed Patient at procedure or test/ unavailable (HD. Will return as schedule allows.)   Adalie Mand MSOT, OTR/L Acute Rehab Pager: [phone] Office: [phone]

## 2018-05-13 NOTE — Progress Notes (Signed)
Pt on the HD treatment after 2.25 hours and she wants off the machine said that " let me go. Come on." She also try  to pulling  out the needle by herself . Pt terminated with 1.25 hours. Dr. Johnney Ou notified. Pt send to her room at stable condition.

## 2018-05-14 DIAGNOSIS — R51 Headache: Secondary | ICD-10-CM | POA: Diagnosis not present

## 2018-05-14 DIAGNOSIS — I5030 Unspecified diastolic (congestive) heart failure: Secondary | ICD-10-CM | POA: Diagnosis not present

## 2018-05-14 DIAGNOSIS — E43 Unspecified severe protein-calorie malnutrition: Secondary | ICD-10-CM | POA: Diagnosis not present

## 2018-05-14 DIAGNOSIS — N186 End stage renal disease: Secondary | ICD-10-CM | POA: Diagnosis not present

## 2018-05-14 DIAGNOSIS — N2889 Other specified disorders of kidney and ureter: Secondary | ICD-10-CM | POA: Diagnosis not present

## 2018-05-14 DIAGNOSIS — G8929 Other chronic pain: Secondary | ICD-10-CM | POA: Diagnosis not present

## 2018-05-14 DIAGNOSIS — E785 Hyperlipidemia, unspecified: Secondary | ICD-10-CM | POA: Diagnosis not present

## 2018-05-14 DIAGNOSIS — D631 Anemia in chronic kidney disease: Secondary | ICD-10-CM | POA: Diagnosis not present

## 2018-05-14 DIAGNOSIS — I150 Renovascular hypertension: Secondary | ICD-10-CM | POA: Diagnosis not present

## 2018-05-14 DIAGNOSIS — K219 Gastro-esophageal reflux disease without esophagitis: Secondary | ICD-10-CM | POA: Diagnosis not present

## 2018-05-14 DIAGNOSIS — D509 Iron deficiency anemia, unspecified: Secondary | ICD-10-CM | POA: Diagnosis not present

## 2018-05-14 DIAGNOSIS — Z743 Need for continuous supervision: Secondary | ICD-10-CM | POA: Diagnosis not present

## 2018-05-14 DIAGNOSIS — N2581 Secondary hyperparathyroidism of renal origin: Secondary | ICD-10-CM | POA: Diagnosis not present

## 2018-05-14 DIAGNOSIS — N189 Chronic kidney disease, unspecified: Secondary | ICD-10-CM | POA: Diagnosis not present

## 2018-05-14 DIAGNOSIS — I120 Hypertensive chronic kidney disease with stage 5 chronic kidney disease or end stage renal disease: Secondary | ICD-10-CM | POA: Diagnosis not present

## 2018-05-14 DIAGNOSIS — M549 Dorsalgia, unspecified: Secondary | ICD-10-CM | POA: Diagnosis not present

## 2018-05-14 DIAGNOSIS — R262 Difficulty in walking, not elsewhere classified: Secondary | ICD-10-CM | POA: Diagnosis not present

## 2018-05-14 DIAGNOSIS — M545 Low back pain: Secondary | ICD-10-CM | POA: Diagnosis not present

## 2018-05-14 DIAGNOSIS — M5489 Other dorsalgia: Secondary | ICD-10-CM | POA: Diagnosis not present

## 2018-05-14 DIAGNOSIS — D689 Coagulation defect, unspecified: Secondary | ICD-10-CM | POA: Diagnosis not present

## 2018-05-14 DIAGNOSIS — R279 Unspecified lack of coordination: Secondary | ICD-10-CM | POA: Diagnosis not present

## 2018-05-14 DIAGNOSIS — Z992 Dependence on renal dialysis: Secondary | ICD-10-CM | POA: Diagnosis not present

## 2018-05-14 DIAGNOSIS — M6281 Muscle weakness (generalized): Secondary | ICD-10-CM | POA: Diagnosis not present

## 2018-05-14 DIAGNOSIS — I10 Essential (primary) hypertension: Secondary | ICD-10-CM | POA: Diagnosis not present

## 2018-05-14 MED ORDER — CALCITRIOL 0.5 MCG PO CAPS: 0.5000 ug | ORAL_CAPSULE | ORAL | Status: DC

## 2018-05-14 MED ORDER — SODIUM CHLORIDE 0.9 % IV SOLN: 62.5000 mg | INTRAVENOUS | Status: DC

## 2018-05-14 MED ORDER — CHLORHEXIDINE GLUCONATE CLOTH 2 % EX PADS
6.0000 | MEDICATED_PAD | Freq: Every day | CUTANEOUS | Status: DC
  Administered 2018-05-14: 6 via TOPICAL

## 2018-05-14 MED ORDER — SENNOSIDES-DOCUSATE SODIUM 8.6-50 MG PO TABS: 1.0000 | ORAL_TABLET | Freq: Every evening | ORAL | 0 refills | Status: AC | PRN

## 2018-05-14 MED ORDER — ASPIRIN 81 MG PO TBEC: 81.0000 mg | DELAYED_RELEASE_TABLET | Freq: Every day | ORAL | 0 refills | Status: AC

## 2018-05-14 MED ORDER — OXYCODONE-ACETAMINOPHEN 5-325 MG PO TABS: 0.5000 | ORAL_TABLET | ORAL | 0 refills | Status: AC | PRN

## 2018-05-14 NOTE — Progress Notes (Signed)
   Subjective: Patient was seen and evaluated at bedside on morning rounds. She is sleeping comfortably. Easily arose. Contributing to physical exam. Does not have any complaint. No acute events overnight.   Objective:  Vital signs in last 24 hours: Vitals:   05/13/18 1130 05/13/18 1145 05/13/18 2020 05/14/18 0346  BP: (!) 148/60 (!) 140/50 (!) 135/55 (!) 123/51  Pulse: 88 90 97 90  Resp:  18 17 14   Temp:  98 F (36.7 C) 98.8 F (37.1 C) (!) 97.4 F (36.3 C)  TempSrc:  Oral Oral Oral  SpO2:  100% 100% 99%  Weight:  55 kg     Physical exam: General: Elderly female.  Lying in the bed in no acute distress CV: RRR, systolic murmur Lungs: Normal work of breathing bibasilar fine crackles, no wheezing Abdomen: Soft, nontender, BS are present Extremities: Pulses palpable bilaterally, no lower extremity edema  Assessment/Plan:  Principal Problem:   Back pain Active Problems:   Essential hypertension   GERD   End stage renal disease ([place])   Palliative care by specialist   Right renal mass   DNR (do not resuscitate) discussion  Nancy [name] is a 77 y.o female with ESRD and HFpEF who presented with acute on chronic lumbar back pain that was affecting her ability, and her family's ability, to care for herself. She has frequent admissions for HD noncompliance and her family is concerned she is unable to care for herself.   Goals of Care  - Frequently misses HD and signs off of HD  - Palliative care has evaluated the patient and spoke with family  - Plan to discharge to SNF and continue hemodialysis  ESRD on HD -Could not complete hemodialysis yesterday again, patient was pulling at her line. Nephrology onboard.  Importance of giving hemodialysis over the discussed family.  Consider goal of care. -Patient will discharge to SNF and plan to continue hemodialysis as much as possible  Bilateral Lumbar Back Pain  - Likely MSK in etiology.  Improved.  Right Renal Mass - CT abdomen  with "Intermediate density mass of the right upper pole kidney, measuring 2.8 x 2.1 cm." Unfortunately CT abdomen with contrast did not help [place] to better characterize the lesion  - Subsequent work-up can be pursued as an outpatient     Dispo: Anticipated discharge to SNF today, after having insurance approval  [name], MD 05/14/2018, 6:44 AM Pager: [phone]

## 2018-05-14 NOTE — Care Management Important Message (Signed)
Important Message  Patient Details  Name: Nancy [name] MRN: [id number] Date of Birth: 1940/07/23   Medicare Important Message Given:  Yes    Orbie Pyo 05/14/2018, 1:59 PM

## 2018-05-14 NOTE — Progress Notes (Signed)
[facility] Progress Note   Dialysis Orders: MWF [facility] 4 hr 300/600 - noncompliant - never ever runs full treatment and misses multiple treatments EDW 59 3 K 2.5 Ca no heparin Calcitriol 1.75 Mircera 200 q 2 weeks venofer 50 per week  Assessment/Plan: 1.Altered mental status --. Has baseline dementia - confusion and agitation has escalated.Working on goals of care. 2. ESRD- MWF - HD currently next HD Friday- set for 3.5 hr - with full BFR/DFR she should get adequate kinetics.  3. Anemia- hgb 8.9>9.5stable- last outpatient Mircera 200 10/14 - redosed Monday 4. Secondary hyperparathyroidism- Ca up P borderline- watch trend and resume binder if this persists- previously on velphoro and has difficulty chewing per g'daughter--NEED to change Ca bath after d/c to 2.25 or 2 Ca whatever is available with 3 K bath-.  Reduce Calcitriol to 0.5 for now - 5.HTN/volume-BP fine in past 24h.  She's been < EDW lately at HD - post wt 55.110/21 which was the last time she had outpatient HD-net UF 2 L 11/2 net UF 1 L 11/4.  11/6 wt is 56.1kg. UF goal. Wed was 2.5 - had net UF 1 L- looks like she ran about 3.5 hr - post wt 55. - Looks like this should be her new edw - had been repeatedly below 59 at outpt HD 6. Nutrition-alb 2.1regular diet+ supplements-- progressive weight loss 7. Low back pain -probably newly dc renal cell right renal pelvis - noted on lumbar CT- f/u CT a/p with IV contrast didn't help elicidate.   8. Goals of care/Ethics consult  Per outpatient records, has never had good dialysis attendance - literally signs off nearly every treatment and frequently skips treatments and this has worsened recently. Sedation with haldol is not an option. Patient wishes dialysis on her own terms which unfortunately leads to marginal outcomes at best. Appreciated palliative care's extensive discussions with patient and daughter.  DNR status. Had an extensive  conversation with granddaughter  explaining that SNF/cotninuing dialysis will likely prolong her life but it is not likely to improve the quality.   Appears to be heading towards SNF placement at [facility].  Appreciate palliative care assistance. Now DNR--need to continue this after d/c  Myriam Jacobson, PA-C [place] [phone] 05/14/2018,10:03 AM  LOS: 4 days   Subjective:   Working with PT - immediately wanted to go back to bed after she sat in the bed.  PT complied with her wishes.  Objective Vitals:   05/13/18 1130 05/13/18 1145 05/13/18 2020 05/14/18 0346  BP: (!) 148/60 (!) 140/50 (!) 135/55 (!) 123/51  Pulse: 88 90 97 90  Resp:  18 17 14   Temp:  98 F (36.7 C) 98.8 F (37.1 C) (!) 97.4 F (36.3 C)  TempSrc:  Oral Oral Oral  SpO2:  100% 100% 99%  Weight:  55 kg     Physical Exam General: frail elderly HOH Heart:  RRR Lungs: dim BS very poor expansion - no overt rales Abdomen: soft NT Extremities: no LE edema Dialysis Access:  Left avf + bruit/thrill   Additional Objective Labs: Basic Metabolic Panel: Recent Labs  Lab 05/10/18 0553 05/11/18 1203 05/13/18 0936  NA 137 137 132*  K 3.8 3.9 3.7  CL 99 103 96*  CO2 26 25 26   GLUCOSE 79 131* 91  BUN 31* 45* 36*  CREATININE 6.26* 7.73* 6.03*  CALCIUM 8.9 9.1 9.0  PHOS 4.9* 5.9* 5.6*   Liver Function Tests: Recent Labs  Lab  05/10/18 0553 05/11/18 1203 05/13/18 0936  ALBUMIN 2.4* 2.2* 2.1*   No results for input(s): LIPASE, AMYLASE in the last 168 hours. CBC: Recent Labs  Lab 05/08/18 1246 05/09/18 1614 05/11/18 1203 05/13/18 0937  WBC 9.0 9.5 7.6 6.6  HGB 8.9* 9.4* 9.5* 9.5*  HCT 30.3* 32.0* 31.3* 31.8*  MCV 92.1 93.0 92.3 92.4  PLT 223 229 216 206   Blood Culture    Component Value Date/Time   SDES BLOOD RIGHT WRIST 04/22/2018 1800   SPECREQUEST  04/22/2018 1800    BOTTLES DRAWN AEROBIC ONLY Blood Culture adequate volume   CULT  04/22/2018 1800    NO GROWTH 5 DAYS Performed at [facility],  [place] [address] [place], [place] [address]    REPTSTATUS 04/27/2018 FINAL 04/22/2018 1800    Cardiac Enzymes: No results for input(s): CKTOTAL, CKMB, CKMBINDEX, TROPONINI in the last 168 hours. CBG: No results for input(s): GLUCAP in the last 168 hours. Iron Studies: No results for input(s): IRON, TIBC, TRANSFERRIN, FERRITIN in the last 72 hours. Lab Results  Component Value Date   INR 1.13 03/29/2018   INR 1.03 03/06/2018   INR 1.30 02/17/2017   Studies/Results: Ct Abdomen W Contrast  Result Date: 05/12/2018 CLINICAL DATA:  Renal mass EXAM: CT ABDOMEN WITH CONTRAST TECHNIQUE: Multidetector CT imaging of the abdomen was performed using the standard protocol following bolus administration of intravenous contrast. CONTRAST:  146mL OMNIPAQUE IOHEXOL 300 MG/ML  SOLN COMPARISON:  100 cc Omnipaque 300 FINDINGS: Lower chest: Heart is enlarged. Coronary artery calcification is evident. Atherosclerotic calcification is noted in the wall of the thoracic aorta. Small bilateral pleural effusions are associated with dependent lower lobe atelectasis bilaterally. Hepatobiliary: Heterogeneous perfusion of liver parenchyma evident. Gallbladder surgically absent. No intrahepatic or extrahepatic biliary dilation. Pancreas: No focal mass lesion. No dilatation of the main duct. No intraparenchymal cyst. No peripancreatic edema. Spleen: No splenomegaly. No focal mass lesion. Adrenals/Urinary Tract: No adrenal nodule or mass. 2.8 x 2.3 cm lesion identified in the central aspect of the upper pole right kidney. This is a relatively homogeneous lesion, new since CT scan of 01/25/2018. On both today's study and the recent lumbar spine CT of 05/08/2018, the lesion shape suggests involvement of the upper pole collecting system. No overt hyperenhancement although the lesion is higher in attenuation than would be expected for a simple cyst. This may be related to focal caliceal dilatation with hemorrhage, obstructed calyx with  infectious debris, or interval development of obstructing urothelial neoplasm. Left kidney atrophic without hydronephrosis. No evidence for hydroureter. The urinary bladder appears normal for the degree of distention. Stomach/Bowel: Stomach is nondistended. No gastric wall thickening. No evidence of outlet obstruction. Duodenum is normally positioned as is the ligament of Treitz. No small bowel wall thickening. No small bowel dilatation. The terminal ileum is normal. The appendix is normal. No gross colonic mass. No colonic wall thickening. Diverticular changes are noted in the left colon without evidence of diverticulitis. Vascular/Lymphatic: There is abdominal aortic atherosclerosis without aneurysm. There is no gastrohepatic or hepatoduodenal ligament lymphadenopathy. No intraperitoneal or retroperitoneal lymphadenopathy. No pelvic sidewall lymphadenopathy. Other: Small volume intraperitoneal free fluid associated with diffuse body wall edema. Musculoskeletal: No worrisome lytic or sclerotic osseous abnormality. IMPRESSION: 1. 2.8 x 2.3 cm central lesion in the upper pole right kidney is new since 01/25/2018. This is relatively rapid interval appearance. Lesion had homogeneous increased attenuation on recent lumbar spine CT of 05/08/2018. On today's study the lesion remains homogeneous without hyperenhancement,  but has attenuation higher than would be expected for a simple cyst and low level diffuse enhancement cannot be excluded. The lesion is probably a dilated upper pole collecting system or central cyst. The high attenuation could be related to hemorrhage, infectious debris, or soft tissue in the setting of neoplasm such as transitional cell carcinoma. Papillary renal cell carcinoma would also be a consideration although this is typically relatively slow growing. MRI of the abdomen without and with contrast would be the study of choice to further characterize. Electronically Signed   By: Misty Stanley M.D.    On: 05/12/2018 14:33   Medications:  . calcitRIOL  1.75 mcg Oral Q M,W,F-HD  . Chlorhexidine Gluconate Cloth  6 each Topical Q0600  . darbepoetin (ARANESP) injection - DIALYSIS  200 mcg Intravenous Q Mon-HD  . feeding supplement (NEPRO CARB STEADY)  237 mL Oral Q24H  . feeding supplement (PRO-STAT SUGAR FREE 64)  30 mL Oral BID  . heparin  5,000 Units Subcutaneous Q8H  . lidocaine  1 patch Transdermal Q24H  . pantoprazole  40 mg Oral Daily

## 2018-05-14 NOTE — Clinical Social Work Placement (Signed)
   CLINICAL SOCIAL WORK PLACEMENT  NOTE 05/14/18 - DISCHARGED TO [facility] VIA AMBULANCE  Date:  05/14/2018  Patient Details  Name: Nancy [name] MRN: [id number] Date of Birth: Jan 17, 1941  Clinical Social Work is seeking post-discharge placement for this patient at the [place] level of care (*CSW will initial, date and re-position this form in  chart as items are completed):  Yes   Patient/family provided with [facility] Work Department's list of facilities offering this level of care within the geographic area requested by the patient (or if unable, by the patient's family).  Yes   Patient/family informed of their freedom to choose among providers that offer the needed level of care, that participate in Medicare, Medicaid or managed care program needed by the patient, have an available bed and are willing to accept the patient.  Yes   Patient/family informed of [facility]'s ownership interest in [facility] and [facility], as well as of the fact that they are under no obligation to receive care at these facilities.  PASRR submitted to EDS on       PASRR number received on       Existing PASRR number confirmed on 05/12/18     FL2 transmitted to all facilities in geographic area requested by pt/family on 05/12/18     FL2 transmitted to all facilities within larger geographic area on       Patient informed that his/her managed care company has contracts with or will negotiate with certain facilities, including the following:        Yes   Patient/family informed of bed offers received.  Patient chooses bed at [facility]     Physician recommends and patient chooses bed at      Patient to be transferred to [facility] on (Once authorization received).  Patient to be transferred to facility by Ambulance     Patient family notified on  05/14/18 of transfer.  Name of family member notified:   Daughter [name] by phone  PHYSICIAN       Additional Comment:    _______________________________________________ Sable Feil, LCSW 05/14/2018, 4:10 PM

## 2018-05-14 NOTE — Progress Notes (Signed)
Physical Therapy Treatment Patient Details Name: Nancy [name] MRN: [id number] DOB: Nov 01, 1940 Today's Date: 05/14/2018    History of Present Illness 77 year old female who presented to the internal medicine clinic for acute on chronic back pain with associated leg weakness. PMH significant of end-stage renal disease on hemodialysis MWF, GERD, CVA, hypertension and  heart failure with preserved ejection fraction.    PT Comments    Pt received in bed. At first agreeable to participation in therapy, stating she wanted to try sitting in recliner. Upon initiation of mobility, pt became verbally aggressive, yelling "you need to get a man to pick me up." After transfer to recliner, pt began yelling again stating she wanted to go back to bed. Pt assisted with transfer back to sidelying in bed.     Follow Up Recommendations  SNF     Equipment Recommendations  None recommended by PT    Recommendations for Other Services       Precautions / Restrictions Precautions Precautions: Fall    Mobility  Bed Mobility Overal bed mobility: Needs Assistance Bed Mobility: Sidelying to Sit;Sit to Sidelying   Sidelying to sit: Max assist Supine to sit: Mod assist     General bed mobility comments: assist with BLE and trunk, cues for sequencing  Transfers Overall transfer level: Needs assistance Equipment used: None Transfers: Stand Pivot Transfers   Stand pivot transfers: Max assist       General transfer comment: max assist pivot transfer bed <> recliner. Once pt in recliner, she began yelling and demanding to go back to bed.   Ambulation/Gait             General Gait Details: unable   Stairs             Wheelchair Mobility    Modified Rankin (Stroke Patients Only)       Balance                                            Cognition Arousal/Alertness: Awake/alert Behavior During Therapy: Flat affect Overall Cognitive Status:  Impaired/Different from baseline Area of Impairment: Attention;Memory;Following commands;Safety/judgement;Awareness;Problem solving                   Current Attention Level: Sustained Memory: Decreased short-term memory Following Commands: Follows one step commands inconsistently;Follows one step commands with increased time Safety/Judgement: Decreased awareness of safety;Decreased awareness of deficits Awareness: Intellectual Problem Solving: Slow processing;Decreased initiation;Difficulty sequencing;Requires verbal cues;Requires tactile cues        Exercises      General Comments        Pertinent Vitals/Pain Pain Assessment: Faces Faces Pain Scale: Hurts little more Pain Location: Back Pain Descriptors / Indicators: Grimacing;Guarding;Moaning Pain Intervention(s): Monitored during session;Limited activity within patient's tolerance;Repositioned    Home Living                      Prior Function            PT Goals (current goals can now be found in the care plan section) Acute Rehab PT Goals Patient Stated Goal: not stated PT Goal Formulation: Patient unable to participate in goal setting Time For Goal Achievement: 05/23/18 Potential to Achieve Goals: Fair Progress towards PT goals: Not progressing toward goals - comment(agitation, refusing participation)    Frequency    Min 2X/week  PT Plan Current plan remains appropriate    Co-evaluation              AM-PAC PT "6 Clicks" Daily Activity  Outcome Measure  Difficulty turning over in bed (including adjusting bedclothes, sheets and blankets)?: Unable Difficulty moving from lying on back to sitting on the side of the bed? : Unable Difficulty sitting down on and standing up from a chair with arms (e.g., wheelchair, bedside commode, etc,.)?: Unable Help needed moving to and from a bed to chair (including a wheelchair)?: A Lot Help needed walking in hospital room?: Total Help needed  climbing 3-5 steps with a railing? : Total 6 Click Score: 7    End of Session Equipment Utilized During Treatment: Gait belt Activity Tolerance: Treatment limited secondary to agitation Patient left: in bed;with call bell/phone within reach;with bed alarm set Nurse Communication: Mobility status PT Visit Diagnosis: Pain;Difficulty in walking, not elsewhere classified (R26.2)     Time: 1007-1020 PT Time Calculation (min) (ACUTE ONLY): 13 min  Charges:  $Therapeutic Activity: 8-22 mins                     Lorrin Goodell, PT  Office # [phone] Pager [phone]    Lorriane Shire 05/14/2018, 10:48 AM

## 2018-05-14 NOTE — Discharge Summary (Addendum)
Name: Nancy [name] MRN: [id number] DOB: 03/31/1941 77 y.o. PCP: Aldine Contes, MD  Date of Admission: 05/08/2018 12:01 PM Date of Discharge: 05/14/2018 Attending Physician: Oda Kilts, MD  Discharge Diagnosis: 1. Goals of Care 2. Back Pain  3. Right renal mass   Discharge Medications: Allergies as of 05/14/2018      Reactions   Hydrocodone Nausea And Vomiting, Other (See Comments)   Dizziness, also   Tape Other (See Comments)   TAPE LEAVES DARK PATCHES ON THE SKIN!!      Medication List    STOP taking these medications   diclofenac sodium 1 % Gel Commonly known as:  VOLTAREN   guaiFENesin 100 MG/5ML Soln Commonly known as:  ROBITUSSIN   hydrocortisone cream 0.5 %   mirtazapine 15 MG tablet Commonly known as:  REMERON   nitroGLYCERIN 0.3 MG SL tablet Commonly known as:  NITROSTAT   pantoprazole 40 MG tablet Commonly known as:  PROTONIX   promethazine 12.5 MG tablet Commonly known as:  PHENERGAN     TAKE these medications   aspirin 81 MG EC tablet Take 1 tablet (81 mg total) by mouth daily.   calcitRIOL 0.5 MCG capsule Commonly known as:  ROCALTROL Take 4 capsules (2 mcg total) by mouth every Monday, Wednesday, and Friday with hemodialysis.   diphenhydrAMINE-zinc acetate cream Commonly known as:  BENADRYL Apply topically 3 (three) times daily as needed for itching.   latanoprost 0.005 % ophthalmic solution Commonly known as:  XALATAN Place 1 drop into both eyes at bedtime.   oxyCODONE-acetaminophen 5-325 MG tablet Commonly known as:  PERCOCET/ROXICET Take 0.5-1 tablets by mouth every 4 (four) hours as needed.   senna-docusate 8.6-50 MG tablet Commonly known as:  Senokot-S Take 1 tablet by mouth at bedtime as needed for mild constipation.       Disposition and follow-up:   [name] was discharged from [facility] in Stable condition.  At the hospital follow up visit please address:  1.  GOC. Continue  to have [place] discussion with the family and patient to align treatment strategies. Back Pain. Continue to assess the patient's back pain. Renal Mass. Discuss with family and patient whether they would like to continue with further work-up.   2.  Labs / imaging needed at time of follow-up: None  3.  Pending labs/ test needing follow-up: None  Follow-up Appointments:  Contact information for follow-up providers    Aldine Contes, MD Follow up.   Specialty:  Internal Medicine Why:  They will call you with an appointment.  Contact information: [address], [address] [place] [address] [phone]            Contact information for after-discharge care    Destination    [facility] Preferred SNF .   Service:  Skilled Nursing Contact information: [address] [place] [facility] Course by problem list:  Goals of Care. Nancy Frayre is a 77 y.o female with ESRD on HD since 09/2017 who was admitted to the hospital for acute on chronic back pain affecting her abilities to perform her ADLs. She has frequent admissions for missed HD sessions and frequently signs off of HD before treatment completion. The patient consistently declines goin to HD but family requests that she go. Palliative care was consulted and held multiple [place] discussions with the patient and her  family. It was determined that she would continue HD even if it is only intermittently. There is understanding with the family that there may come a time when the patient cannot make the decision of going to HD and deciding whether to make her go or have her pass in peace. It is important to continue to align [place] with the patient and her family. He is high risk for readmission. Please continue to consult palliative care when the patient is admitted.   Back Pain. For the patient's acute on chronic back pain an CT lumbar spine was preformed to  evaluated for the cause of her pain. No significant pathology was identified. Back on the patient's symptoms it was felt to be likely of MSK etiology. She was started on lidocaine patches and Oxy IR 2.5 mg every 4 hours PRN. Over the course of her hospitalization her back pain improved. It was felt that given her deconditioning she would be best served at a SNF for further rehab.   Right renal mass. On the initial CT lumbar spine a "Intermediate density mass of the right upper pole kidney, measuring 2.8 x 2.1 cm" was identified. A dedicated CT abdomen with contrast was performed that unfortunately did not help [place] further characterize the lesion. MRI with contrast was recommended; however, given her ESRD this was not done. Family and the patient are aware of the mass and further work-up can be pursued as an outpatient.   Discharge Vitals:   BP (!) 138/59 (BP Location: Right Arm)   Pulse 93   Temp 98.6 F (37 C) (Oral)   Resp 19   Wt 55 kg   SpO2 99%   BMI 21.48 kg/m   Pertinent Labs, Studies, and Procedures:   CT Abdomen with Contrast  IMPRESSION: 1. 2.8 x 2.3 cm central lesion in the upper pole right kidney is new since 01/25/2018. This is relatively rapid interval appearance. Lesion had homogeneous increased attenuation on recent lumbar spine CT of 05/08/2018. On today's study the lesion remains homogeneous without hyperenhancement, but has attenuation higher than would be expected for a simple cyst and low level diffuse enhancement cannot be excluded. The lesion is probably a dilated upper pole collecting system or central cyst. The high attenuation could be related to hemorrhage, infectious debris, or soft tissue in the setting of neoplasm such as transitional cell carcinoma. Papillary renal cell carcinoma would also be a consideration although this is typically relatively slow growing. MRI of the abdomen without and with contrast would be the study of choice to further  characterize.  CT Lumbar Spine IMPRESSION: 1. Intermediate density mass of the right upper pole kidney, measuring 2.8 x 2.1 cm. Dedicated abdominal imaging with CT of the abdomen and pelvis with contrast or MRI of the abdomen with and without contrast is recommended, as this appearance is compatible with renal cell carcinoma. 2. Bilateral pleural effusions. 3. No acute fracture or static subluxation of the lumbar spine or visualized lower thoracic spine.  Discharge Instructions: Discharge Instructions    Call MD for:  difficulty breathing, headache or visual disturbances   Complete by:  As directed    Call MD for:  persistant nausea and vomiting   Complete by:  As directed    Diet - low sodium heart healthy   Complete by:  As directed    Discharge instructions   Complete by:  As directed    Thanks for allowing [place] taking care of you in our clinic today. We discharge  you to skilled nurse facility. Please let us know if you have any questions or concerns. You can call us back at [phone]. Thanks,  Dr. Myrtie Hawk   Increase activity slowly   Complete by:  As directed       Signed: [name], MD 05/14/2018, 4:42 PM

## 2018-05-15 DIAGNOSIS — R51 Headache: Secondary | ICD-10-CM | POA: Diagnosis not present

## 2018-05-15 DIAGNOSIS — N186 End stage renal disease: Secondary | ICD-10-CM | POA: Diagnosis not present

## 2018-05-15 DIAGNOSIS — D509 Iron deficiency anemia, unspecified: Secondary | ICD-10-CM | POA: Diagnosis not present

## 2018-05-15 DIAGNOSIS — N2581 Secondary hyperparathyroidism of renal origin: Secondary | ICD-10-CM | POA: Diagnosis not present

## 2018-05-15 DIAGNOSIS — D631 Anemia in chronic kidney disease: Secondary | ICD-10-CM | POA: Diagnosis not present

## 2018-05-15 DIAGNOSIS — D689 Coagulation defect, unspecified: Secondary | ICD-10-CM | POA: Diagnosis not present

## 2018-05-18 DIAGNOSIS — D631 Anemia in chronic kidney disease: Secondary | ICD-10-CM | POA: Diagnosis not present

## 2018-05-18 DIAGNOSIS — D689 Coagulation defect, unspecified: Secondary | ICD-10-CM | POA: Diagnosis not present

## 2018-05-18 DIAGNOSIS — N186 End stage renal disease: Secondary | ICD-10-CM | POA: Diagnosis not present

## 2018-05-18 DIAGNOSIS — N2581 Secondary hyperparathyroidism of renal origin: Secondary | ICD-10-CM | POA: Diagnosis not present

## 2018-05-18 DIAGNOSIS — D509 Iron deficiency anemia, unspecified: Secondary | ICD-10-CM | POA: Diagnosis not present

## 2018-05-18 DIAGNOSIS — R51 Headache: Secondary | ICD-10-CM | POA: Diagnosis not present

## 2018-05-18 NOTE — Progress Notes (Signed)
Internal Medicine Clinic Attending  I saw and evaluated the patient.  I personally confirmed the key portions of the history and exam documented by Dr. Blum and I reviewed pertinent patient test results.  The assessment, diagnosis, and plan were formulated together and I agree with the documentation in the resident's note. 

## 2018-05-20 ENCOUNTER — Other Ambulatory Visit: Payer: Self-pay

## 2018-05-20 DIAGNOSIS — N186 End stage renal disease: Secondary | ICD-10-CM | POA: Diagnosis not present

## 2018-05-20 DIAGNOSIS — D689 Coagulation defect, unspecified: Secondary | ICD-10-CM | POA: Diagnosis not present

## 2018-05-20 DIAGNOSIS — D509 Iron deficiency anemia, unspecified: Secondary | ICD-10-CM | POA: Diagnosis not present

## 2018-05-20 DIAGNOSIS — D631 Anemia in chronic kidney disease: Secondary | ICD-10-CM | POA: Diagnosis not present

## 2018-05-20 DIAGNOSIS — N2581 Secondary hyperparathyroidism of renal origin: Secondary | ICD-10-CM | POA: Diagnosis not present

## 2018-05-20 DIAGNOSIS — R51 Headache: Secondary | ICD-10-CM | POA: Diagnosis not present

## 2018-05-20 NOTE — Patient Outreach (Signed)
[facility]) Care Management  05/20/2018  Nancy [name] [birth year] [id number]  TELEPHONE SCREENING Referral date:05/06/18 Referral source:primary MD office Referral reason:Needs evaluation for possible SNF placement for rehab Insurance:[employer] health care  Upon chart review patient discharged from the hospital on 05/14/18 and transferred to [facility] care skilled nursing facility.   PLAN; RNCM will notify [facility] social worker to follow patient for discharge disposition and/ or [facility] care management needs  Quinn Plowman RN,BSN,CCM [facility] Telephonic  [phone]

## 2018-05-21 ENCOUNTER — Other Ambulatory Visit: Payer: Self-pay | Admitting: Licensed Clinical Social Worker

## 2018-05-21 NOTE — Patient Outreach (Signed)
[place] [facility] Care Management  [facility] Social Work  05/21/2018  Nancy [name] 1940/12/30 [id number]  Encounter Medications:  Outpatient Encounter Medications as of 05/21/2018  Medication Sig Note  . aspirin 81 MG EC tablet Take 1 tablet (81 mg total) by mouth daily.   . calcitRIOL (ROCALTROL) 0.5 MCG capsule Take 4 capsules (2 mcg total) by mouth every Monday, Wednesday, and Friday with hemodialysis. 03/07/2018: Given at dialysis per pt- unable to verify dose  . diphenhydrAMINE-zinc acetate (BENADRYL) cream Apply topically 3 (three) times daily as needed for itching.   . latanoprost (XALATAN) 0.005 % ophthalmic solution Place 1 drop into both eyes at bedtime. 05/10/2018: Lf 10.5.19  . oxyCODONE-acetaminophen (PERCOCET) 5-325 MG tablet Take 0.5-1 tablets by mouth every 4 (four) hours as needed.   . senna-docusate (SENOKOT-S) 8.6-50 MG tablet Take 1 tablet by mouth at bedtime as needed for mild constipation.    No facility-administered encounter medications on file as of 05/21/2018.     Functional Status:  In your present state of health, do you have any difficulty performing the following activities: 05/08/2018 05/08/2018  Hearing? N N  Vision? Y Y  Comment - -  Difficulty concentrating or making decisions? Y Y  Comment - -  Walking or climbing stairs? Y Y  Dressing or bathing? Y Y  Doing errands, shopping? Y Y  Preparing Food and eating ? - -  Using the Toilet? - -  In the past six months, have you accidently leaked urine? - -  Do you have problems with loss of bowel control? - -  Managing your Medications? - -  Managing your Finances? - -  Housekeeping or managing your Housekeeping? - -  Some recent data might be hidden    Fall/Depression Screening:  PHQ 2/9 Scores 04/28/2018 03/05/2018 02/12/2018 02/10/2018 01/20/2018 01/06/2018 12/25/2017  PHQ - 2 Score 3 0 2 0 0 0 0  PHQ- 9 Score 5 - - - - - -    Assessment: [facility] CSW arrived at [facility] and  successfully completed initial [place] with patient after receiving new referral on 05/20/18. Patient is agreeable to [place] but wishes to review consent with her daughter and grand daughter before signing. Patient has two visitors from her church come at the end of the visit. Patient reports to be ready to return home. Patient denies wanting LTC placement. Patient shares that she is independent and can appropriately take care of herself at home without the assistance of an aide. Patient has Medicaid and [facility] CSW informed her that this service would be free of charge. Patient's church friend stated that patient had aides involved in the past but patient wanted them to stop coming out as she felt able to care for herself without their assistance. Patient reports ongoing lower back pain. Patient reports having stable transportation through her church members, daughter and granddaughter. Patient reports preparing her own meals. Patient was encouraged to consider gaining an aide to help smooth her transition back home. Patient reports being able to consider a Home Health aide instead post SNF discharge. Patient reports that when she was at home, she went to church weekly to gain socialization. [facility] CSW completed call to patient's daughter but was unable to reach her successfully. HIPPA compliant voice message left encouraging a return call when available. Patient agreeable to [facility] CSW following her during her SNF stay. [facility] CM Services folder and [place] business card left with patient. [facility] CSW  unable to meet with SNF social worker as [facility] CSW was unable to locate [place] in her office. [facility] CSW completed call to [place] and left a care coordination update on her   Plan: [facility] CSW will follow up with facility within two weeks.  [name], BSW, MSW, [email or web address] Phone: [phone] Fax: [phone]

## 2018-05-22 DIAGNOSIS — D509 Iron deficiency anemia, unspecified: Secondary | ICD-10-CM | POA: Diagnosis not present

## 2018-05-22 DIAGNOSIS — R51 Headache: Secondary | ICD-10-CM | POA: Diagnosis not present

## 2018-05-22 DIAGNOSIS — N186 End stage renal disease: Secondary | ICD-10-CM | POA: Diagnosis not present

## 2018-05-22 DIAGNOSIS — D631 Anemia in chronic kidney disease: Secondary | ICD-10-CM | POA: Diagnosis not present

## 2018-05-22 DIAGNOSIS — D689 Coagulation defect, unspecified: Secondary | ICD-10-CM | POA: Diagnosis not present

## 2018-05-22 DIAGNOSIS — N2581 Secondary hyperparathyroidism of renal origin: Secondary | ICD-10-CM | POA: Diagnosis not present

## 2018-05-25 DIAGNOSIS — R51 Headache: Secondary | ICD-10-CM | POA: Diagnosis not present

## 2018-05-25 DIAGNOSIS — D689 Coagulation defect, unspecified: Secondary | ICD-10-CM | POA: Diagnosis not present

## 2018-05-25 DIAGNOSIS — D509 Iron deficiency anemia, unspecified: Secondary | ICD-10-CM | POA: Diagnosis not present

## 2018-05-25 DIAGNOSIS — N186 End stage renal disease: Secondary | ICD-10-CM | POA: Diagnosis not present

## 2018-05-25 DIAGNOSIS — D631 Anemia in chronic kidney disease: Secondary | ICD-10-CM | POA: Diagnosis not present

## 2018-05-25 DIAGNOSIS — N2581 Secondary hyperparathyroidism of renal origin: Secondary | ICD-10-CM | POA: Diagnosis not present

## 2018-05-27 DIAGNOSIS — D631 Anemia in chronic kidney disease: Secondary | ICD-10-CM | POA: Diagnosis not present

## 2018-05-27 DIAGNOSIS — N186 End stage renal disease: Secondary | ICD-10-CM | POA: Diagnosis not present

## 2018-05-27 DIAGNOSIS — D689 Coagulation defect, unspecified: Secondary | ICD-10-CM | POA: Diagnosis not present

## 2018-05-27 DIAGNOSIS — N2581 Secondary hyperparathyroidism of renal origin: Secondary | ICD-10-CM | POA: Diagnosis not present

## 2018-05-27 DIAGNOSIS — R51 Headache: Secondary | ICD-10-CM | POA: Diagnosis not present

## 2018-05-27 DIAGNOSIS — D509 Iron deficiency anemia, unspecified: Secondary | ICD-10-CM | POA: Diagnosis not present

## 2018-05-28 ENCOUNTER — Other Ambulatory Visit: Payer: Self-pay | Admitting: Licensed Clinical Social Worker
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
## 2018-05-31 DIAGNOSIS — D509 Iron deficiency anemia, unspecified: Secondary | ICD-10-CM | POA: Diagnosis not present

## 2018-05-31 DIAGNOSIS — D631 Anemia in chronic kidney disease: Secondary | ICD-10-CM | POA: Diagnosis not present

## 2018-05-31 DIAGNOSIS — D689 Coagulation defect, unspecified: Secondary | ICD-10-CM | POA: Diagnosis not present

## 2018-05-31 DIAGNOSIS — R51 Headache: Secondary | ICD-10-CM | POA: Diagnosis not present

## 2018-05-31 DIAGNOSIS — N2581 Secondary hyperparathyroidism of renal origin: Secondary | ICD-10-CM | POA: Diagnosis not present

## 2018-05-31 DIAGNOSIS — N186 End stage renal disease: Secondary | ICD-10-CM | POA: Diagnosis not present

## 2018-06-02 DIAGNOSIS — D509 Iron deficiency anemia, unspecified: Secondary | ICD-10-CM | POA: Diagnosis not present

## 2018-06-02 DIAGNOSIS — N2581 Secondary hyperparathyroidism of renal origin: Secondary | ICD-10-CM | POA: Diagnosis not present

## 2018-06-02 DIAGNOSIS — D689 Coagulation defect, unspecified: Secondary | ICD-10-CM | POA: Diagnosis not present

## 2018-06-02 DIAGNOSIS — D631 Anemia in chronic kidney disease: Secondary | ICD-10-CM | POA: Diagnosis not present

## 2018-06-02 DIAGNOSIS — N186 End stage renal disease: Secondary | ICD-10-CM | POA: Diagnosis not present

## 2018-06-02 DIAGNOSIS — R51 Headache: Secondary | ICD-10-CM | POA: Diagnosis not present

## 2018-06-05 DIAGNOSIS — D631 Anemia in chronic kidney disease: Secondary | ICD-10-CM | POA: Diagnosis not present

## 2018-06-05 DIAGNOSIS — N186 End stage renal disease: Secondary | ICD-10-CM | POA: Diagnosis not present

## 2018-06-05 DIAGNOSIS — N2581 Secondary hyperparathyroidism of renal origin: Secondary | ICD-10-CM | POA: Diagnosis not present

## 2018-06-05 DIAGNOSIS — R51 Headache: Secondary | ICD-10-CM | POA: Diagnosis not present

## 2018-06-05 DIAGNOSIS — D689 Coagulation defect, unspecified: Secondary | ICD-10-CM | POA: Diagnosis not present

## 2018-06-05 DIAGNOSIS — D509 Iron deficiency anemia, unspecified: Secondary | ICD-10-CM | POA: Diagnosis not present

## 2018-06-06 DIAGNOSIS — Z992 Dependence on renal dialysis: Secondary | ICD-10-CM | POA: Diagnosis not present

## 2018-06-06 DIAGNOSIS — N186 End stage renal disease: Secondary | ICD-10-CM | POA: Diagnosis not present

## 2018-06-06 DIAGNOSIS — I150 Renovascular hypertension: Secondary | ICD-10-CM | POA: Diagnosis not present

## 2018-06-08 ENCOUNTER — Other Ambulatory Visit: Payer: Self-pay | Admitting: Licensed Clinical Social Worker

## 2018-06-08 DIAGNOSIS — D509 Iron deficiency anemia, unspecified: Secondary | ICD-10-CM | POA: Diagnosis not present

## 2018-06-08 DIAGNOSIS — R51 Headache: Secondary | ICD-10-CM | POA: Diagnosis not present

## 2018-06-08 DIAGNOSIS — N186 End stage renal disease: Secondary | ICD-10-CM | POA: Diagnosis not present

## 2018-06-08 DIAGNOSIS — N2581 Secondary hyperparathyroidism of renal origin: Secondary | ICD-10-CM | POA: Diagnosis not present

## 2018-06-08 NOTE — Patient Outreach (Signed)
[place] [facility]) Care Management  06/08/2018  Nancy [name] 1941-02-10 [id number]  [facility] CSW checked PING and is aware that patient is still at [facility] for short term rehab. [facility] CSW will complete PAC Consult within one week and prepare for patient's expected discharge.  [name], BSW, MSW, [email or web address] Phone: [phone] Fax: [phone]

## 2018-06-09 ENCOUNTER — Other Ambulatory Visit: Payer: Self-pay | Admitting: Licensed Clinical Social Worker

## 2018-06-09 NOTE — Patient Outreach (Addendum)
[place] [facility]) Care Management  06/09/2018  Nancy [name] 1940/07/22 [id number]  [facility] CSW arrived at [facility] SNF and successfully completed [facility] Consult. [facility] CSW met with SNF social worker [name] and was informed that patient's family is looking into ALF placement but have not informed patient yet. Patient already has Medicaid which would make the ALF placement process easier. SNF social worker unsure if this will happen before her expected SNF discharge as patient just won her appeal. [facility] CSW met with patient briefly while she was receiving OT. Patient's occupational therapist states that patient has been making progress in therapy but sometimes refuses to participate in therapy. Therapist reports that patient would be very appropriate for ALF as she is able to do a lot on her own and would benefit from the socialization. Patient has no set discharge date as of now. [facility] CSW will follow up within one-two weeks.  [name], BSW, MSW, [email or web address] Phone: [phone] Fax: [phone]

## 2018-06-10 DIAGNOSIS — N186 End stage renal disease: Secondary | ICD-10-CM | POA: Diagnosis not present

## 2018-06-10 DIAGNOSIS — N2581 Secondary hyperparathyroidism of renal origin: Secondary | ICD-10-CM | POA: Diagnosis not present

## 2018-06-10 DIAGNOSIS — D509 Iron deficiency anemia, unspecified: Secondary | ICD-10-CM | POA: Diagnosis not present

## 2018-06-10 DIAGNOSIS — R51 Headache: Secondary | ICD-10-CM | POA: Diagnosis not present

## 2018-06-11 DIAGNOSIS — M545 Low back pain: Secondary | ICD-10-CM | POA: Diagnosis not present

## 2018-06-11 DIAGNOSIS — N186 End stage renal disease: Secondary | ICD-10-CM | POA: Diagnosis not present

## 2018-06-11 DIAGNOSIS — M6281 Muscle weakness (generalized): Secondary | ICD-10-CM | POA: Diagnosis not present

## 2018-06-11 DIAGNOSIS — Z992 Dependence on renal dialysis: Secondary | ICD-10-CM | POA: Diagnosis not present

## 2018-06-11 NOTE — Telephone Encounter (Signed)
Med review

## 2018-06-12 DIAGNOSIS — N186 End stage renal disease: Secondary | ICD-10-CM | POA: Diagnosis not present

## 2018-06-12 DIAGNOSIS — D509 Iron deficiency anemia, unspecified: Secondary | ICD-10-CM | POA: Diagnosis not present

## 2018-06-12 DIAGNOSIS — N2581 Secondary hyperparathyroidism of renal origin: Secondary | ICD-10-CM | POA: Diagnosis not present

## 2018-06-12 DIAGNOSIS — R51 Headache: Secondary | ICD-10-CM | POA: Diagnosis not present

## 2018-06-15 ENCOUNTER — Telehealth: Payer: Self-pay | Admitting: *Deleted

## 2018-06-15 DIAGNOSIS — N186 End stage renal disease: Secondary | ICD-10-CM | POA: Diagnosis not present

## 2018-06-15 DIAGNOSIS — R51 Headache: Secondary | ICD-10-CM | POA: Diagnosis not present

## 2018-06-15 DIAGNOSIS — N2581 Secondary hyperparathyroidism of renal origin: Secondary | ICD-10-CM | POA: Diagnosis not present

## 2018-06-15 DIAGNOSIS — D509 Iron deficiency anemia, unspecified: Secondary | ICD-10-CM | POA: Diagnosis not present

## 2018-06-15 NOTE — Telephone Encounter (Signed)
Spoke with daughter regarding her mom dermatology referral / unable to reach patient, patient is in a rehab facility. Daughter is going to contact [facility] dermatology office to set up appointment [phone].  Her mom also has appointment with opc 12-10-019 @ 2:15pm / she was not aware of this appointment.

## 2018-06-16 ENCOUNTER — Ambulatory Visit: Payer: Self-pay

## 2018-06-19 ENCOUNTER — Telehealth: Payer: Self-pay | Admitting: Internal Medicine

## 2018-06-19 ENCOUNTER — Other Ambulatory Visit: Payer: Self-pay | Admitting: Licensed Clinical Social Worker

## 2018-06-19 NOTE — Patient Outreach (Signed)
[place] [facility]) Care Management  06/19/2018  Nancy [name] [birth year] [id number]  [facility] CSW arrived at [facility] SNF but was unable to locate patient at facility. Nurse informed [facility] CSW that patient discharged back home last night. [facility] CSW met with SNF social worker [name] and was informed that family decided against ALF placement. [name] reported that patient discharged back home with [facility] PT, OT, Nursing and aide. The following medical equipment was also ordered for patient at discharge: wheelchair, and bedside commode. [facility] CSW will sign off at this time and will refer [facility] RNCM for transition of care.   [name], BSW, MSW, [email or web address] Phone: [phone] Fax: [phone]

## 2018-06-19 NOTE — Telephone Encounter (Signed)
Thank you :)

## 2018-06-19 NOTE — Telephone Encounter (Signed)
Nancy [name] ([facility])  wanted let us know they will be going out to start home health services on Sunday; [phone]

## 2018-06-22 DIAGNOSIS — D509 Iron deficiency anemia, unspecified: Secondary | ICD-10-CM | POA: Diagnosis not present

## 2018-06-22 DIAGNOSIS — R51 Headache: Secondary | ICD-10-CM | POA: Diagnosis not present

## 2018-06-22 DIAGNOSIS — N186 End stage renal disease: Secondary | ICD-10-CM | POA: Diagnosis not present

## 2018-06-22 DIAGNOSIS — N2581 Secondary hyperparathyroidism of renal origin: Secondary | ICD-10-CM | POA: Diagnosis not present

## 2018-06-23 ENCOUNTER — Other Ambulatory Visit: Payer: Self-pay | Admitting: Licensed Clinical Social Worker

## 2018-06-23 ENCOUNTER — Other Ambulatory Visit: Payer: Self-pay

## 2018-06-23 NOTE — Patient Outreach (Addendum)
[place] [facility]) Care Management  06/23/2018  [place] B Kaine [birth year] [id number]  [facility] CSW received new referral on 06/23/18. Family is wanting assistance with ALF placement and PCS aide.  [facility] CSW completed outreach call to granddaughter [name] and was able to reach her successfully. HIPPA verifications received. [facility] CSW introduce self, reason for call and of [facility] social work services. Patient reports that she appreciates [facility] CSW's call but family has decided as of today that patient will NOT be going to an ALF. Family and patient have completed several tours of different ALF's and feel that none are appropriate. Family state that the facilities they toured were dirty and not well kept and they feel patient would get better care staying in the community. [facility] CSW completed education on the ALF placement process and listed off each ALF that accepts Special [place] Medicaid within [facility]. Family have not visited two of these facilities listed yet. Granddaughter is agreeable to [employer] her a complete list of all ALF's within [facility] that accept Medicaid. However, granddaughter reports that she still feels patient will get better care in the community based on her observations of the other Medicaid approved ALF's. Granddaughter is agreeable to contact [facility] CSW in case family wishes to pursue ALF placement in the future.  [facility] CSW provided education on personal care resources within the area as patient may need additional support other than PCS as they do not provide aide hours at night. Granddaughter reports that she is agreeable to paying out of pocket for additional services to help support patient to be able to stay independently in the community. Granddaughter reports that [facility] will be completing an re-evaluation and should start back services soon. Patient is currently residing with granddaughter for now but will return to her own apartment when ready.  Granddaughter reports not needing assistance with gaining PCS just yet but if problems arise she will contact [facility] CSW. [facility] CSW sent secure email to family with requested resources. [facility] CSW will sign off at this time but is happy to get back involved if future social work needs arise.  [name], BSW, MSW, [email or web address] Phone: [phone] Fax: [phone]

## 2018-06-23 NOTE — Patient Outreach (Addendum)
[place] [facility]) Care Management  06/23/2018  Nancy [name] 1940/07/24 [id number]   Referral received on Friday 06-19-2018 due to member with recent discharge from SNF. Member with recent hospitalization due to ESRD. PMH: HTN; Aortic stenosis; Atherosclerosis; Pulmonary nodules; GERD; Duodenitis with bleeding; LE OSA; Anemia; ESRD; HLD; CVA; Glaucoma; Back Pain; R Renal Mass; DNR. Member Called member at preferred number; however, no response. Called member's granddaughter Nancy [name] and was able to speak with member. Member stated she has been living with her granddaughter since discharge from SNF and stated "I want to go home". Member provided answers to most of telephone assessment and would ask her granddaughter to answer some questions.   Family states that member attends hemodialysis on MWF from 1240-1500 at [facility] on [address] and at times, member is non-compliant with going to HD appointments and at times, member will leave early from HD. Nancy [name] stated that a family member has to be with member during entire HD treatment time and requested information concerning PCS Aide for assistance. Nancy [name] states that she works 3rd shift; however, transports member back and forth to HD and PCP appointments as needed. Nancy [name] stated that she received a call from [facility] and she made Agency aware that family is currently looking for Assisted Living placement for member short term as member is not safe providing care for self at home alone, refuses meals, non-compliant with medications. Nancy [name] stated she will call this week to make PCP follow up appointment for member and will bring copy of Advance Directive to HD Friday to provide copy to CM.  SW name and phone number provided to family as requested.   Referral sent to SW for PCS Aide and Assisted Living needs. Will follow up with face-to-face visit on Friday 06-26-18 at 1400.  THN CM Care Plan Problem One     Most  Recent Value  Care Plan Problem One  Risk for readmission related to ESRD as evidenced by recent hospitalization  Role Documenting the Problem One  Care Management [place] for Problem One  Active  [facility] Long Term Goal   Member will not be readmitted to hospital within next 30 days.  THN Long Term Goal Start Date  06/23/18  Interventions for Problem One Long Term Goal  Provided education concerning importance of scheduling PCP follow up visit this week,  CM appointment scheduled this week with member  THN CM Short Term Goal #1   member will verbalize importance of mainitaining renal/low salt diet within next 30 days.   THN CM Short Term Goal #1 Start Date  06/23/18  Interventions for Short Term Goal #1  dicussed with member and granddauther concerning renal diet and nepro nutrition  THN CM Short Term Goal #2   Member will verbalize importance of attending all Hemodialysis appointments as scheduled.   THN CM Short Term Goal #2 Start Date  06/23/18  Interventions for Short Term Goal #2  Discussed with member and granddaugher concerning transportation assistance to HD,  Discussed following up with SW referral for possible Aide to stay with member during HD as granddaughter is currently assisting.    THN CM Care Plan Problem Two     Most Recent Value  Care Plan Problem Two  Chronic Pain  Role Documenting the Problem Two  Care Management Coordinator  Care Plan for Problem Two  Active  THN CM Short Term Goal #1   member will report decreased level back pain from 10  on scale 0-10 with next 3days  THN CM Short Term Goal #1 Start Date  06/23/18  Interventions for Short Term Goal #2   Educated member on importance of medication compliance,  spoke to [name] granddaughter concerning calling PCP this week to schedule follow up appointment for member,  discussed with member what makes pain better and member stated applying heat.      Nancy Mola "ANN" Josiah Lobo, RN-BSN  [facility] Care Management   Community Care Management Coordinator  [phone] [email or web address]

## 2018-06-25 ENCOUNTER — Other Ambulatory Visit: Payer: Self-pay

## 2018-06-25 ENCOUNTER — Ambulatory Visit (HOSPITAL_COMMUNITY)
Admission: RE | Admit: 2018-06-25 | Discharge: 2018-06-25 | Disposition: A | Discharge status: Home / Self Care | Payer: Medicare Other | Source: Ambulatory Visit | Attending: Student in an Organized Health Care Education/Training Program | Admitting: Student in an Organized Health Care Education/Training Program

## 2018-06-25 ENCOUNTER — Ambulatory Visit (INDEPENDENT_AMBULATORY_CARE_PROVIDER_SITE_OTHER): Payer: Medicare Other | Admitting: Internal Medicine

## 2018-06-25 ENCOUNTER — Encounter: Payer: Self-pay | Admitting: Internal Medicine

## 2018-06-25 VITALS — BP 140/52 | HR 88 | Temp 97.6°F | Ht 63.0 in | Wt 100.7 lb

## 2018-06-25 DIAGNOSIS — G8911 Acute pain due to trauma: Secondary | ICD-10-CM | POA: Diagnosis not present

## 2018-06-25 DIAGNOSIS — Z9181 History of falling: Secondary | ICD-10-CM

## 2018-06-25 DIAGNOSIS — Z992 Dependence on renal dialysis: Secondary | ICD-10-CM

## 2018-06-25 DIAGNOSIS — R0781 Pleurodynia: Secondary | ICD-10-CM | POA: Diagnosis not present

## 2018-06-25 DIAGNOSIS — S299XXA Unspecified injury of thorax, initial encounter: Secondary | ICD-10-CM | POA: Diagnosis not present

## 2018-06-25 DIAGNOSIS — N186 End stage renal disease: Secondary | ICD-10-CM | POA: Diagnosis not present

## 2018-06-25 MED ORDER — KETOROLAC TROMETHAMINE 30 MG/ML IJ SOLN
30.0000 mg | Freq: Once | INTRAMUSCULAR | Status: AC
  Administered 2018-06-25: 30 mg via INTRAMUSCULAR

## 2018-06-25 NOTE — Patient Instructions (Signed)
Thank you for allowing [place] to provide your care. Today we are getting an x-ray of your ribs to make sure you didn't fracture anything. We've given you a shot for your pain. This will improve your pain.  For your difficulty sleeping I think that is related to not getting enough dialysis. Please continue dialysis as prescribed.  These come back to see [place] in two months. If you have any questions or concerns please do not hesitate to call us. I hope you and your family have a happy holiday season.

## 2018-06-25 NOTE — Progress Notes (Signed)
   CC: Right chest pain  HPI:  [name] is a 77 y.o. female with PMHx listed below presenting for continued evaluation and management of her chronic medical illnesses. Please see the A&P for the status of the patient's chronic medical problems.  Past Medical History:  Diagnosis Date  . Anemia   . Aortic stenosis   . Bacterial sinusitis 09/17/2011  . CHF (congestive heart failure) ([place])   . CKD (chronic kidney disease) stage 4, GFR 15-29 ml/min ([place]) 08/11/2006   Cr continues to increase. Proteinuria on UA 02/10/12.    . Colitis   . CVA (cerebrovascular accident) [facility])    New hemorrhagic per CT scan '09  . Diverticulosis of colon   . Dysfunctional uterine bleeding   . ESRD (end stage renal disease) on dialysis ([place])    "MWF; [address]" (11/27/2017)  . Fecal impaction ([place])   . Headache(784.0)   . Heart murmur   . HERNIORRHAPHY, HX OF 08/11/2006  . Hypertension   . OA (osteoarthritis)    bilateral knees  . Postmenopausal   . Pulmonary nodule   . TINEA CRURIS 01/12/2007   Review of Systems: Performed and all others negative.  Physical Exam: Vitals:   06/25/18 1010  BP: (!) 140/52  Pulse: 88  Temp: 97.6 F (36.4 C)  TempSrc: Oral  SpO2: 100%  Weight: 100 lb 11.2 oz (45.7 kg)  Height: 5\' 3"  (1.6 m)   General: Elderly thin female in no acute distress Pulm: Good air movement with no wheezing or crackles  CV: RRR, holosystolic murmur Abdomen: Active bowel sounds, soft, non-distended, no tenderness to palpation  Extremities: No LE edema   Assessment & Plan:   See Encounters Tab for problem based charting.  Patient discussed with Dr. Evette Doffing

## 2018-06-26 ENCOUNTER — Other Ambulatory Visit: Payer: Self-pay

## 2018-06-26 ENCOUNTER — Ambulatory Visit: Payer: Self-pay

## 2018-06-26 DIAGNOSIS — N2581 Secondary hyperparathyroidism of renal origin: Secondary | ICD-10-CM | POA: Diagnosis not present

## 2018-06-26 DIAGNOSIS — R51 Headache: Secondary | ICD-10-CM | POA: Diagnosis not present

## 2018-06-26 DIAGNOSIS — N186 End stage renal disease: Secondary | ICD-10-CM | POA: Diagnosis not present

## 2018-06-26 DIAGNOSIS — R0781 Pleurodynia: Secondary | ICD-10-CM | POA: Insufficient documentation

## 2018-06-26 DIAGNOSIS — D509 Iron deficiency anemia, unspecified: Secondary | ICD-10-CM | POA: Diagnosis not present

## 2018-06-26 NOTE — Progress Notes (Signed)
Internal Medicine Clinic Attending  I saw and evaluated the patient.  I personally confirmed the key portions of the history and exam documented by Dr. Helberg and I reviewed pertinent patient test results.  The assessment, diagnosis, and plan were formulated together and I agree with the documentation in the resident's note. 

## 2018-06-26 NOTE — Patient Outreach (Addendum)
[place] [facility]) Care Management   06/26/2018  Nancy [name] September 27, 1940 [id number]  Nancy [name] is an 77 y.o. female  Subjective: Alert and oriented to person, place, time, and situation.  BP 121/66   Pulse 91   Ht 1.6 m (_0 )   Wt 100 lb (45.4 kg)   BMI 17.71 kg/m  Objective:   Review of Systems  Constitutional: Positive for weight loss.  HENT: Negative.   Eyes: Positive for double vision.  Respiratory: Positive for cough.   Cardiovascular: Negative.   Gastrointestinal: Negative.   Genitourinary: Positive for dysuria.  Musculoskeletal: Positive for back pain and falls.  Skin: Negative.   Neurological: Positive for dizziness, weakness and headaches.  Endo/Heme/Allergies: Negative.   Psychiatric/Behavioral: Positive for depression. The patient is nervous/anxious.     Physical Exam  Constitutional: She is oriented to person, place, and time. She appears well-developed.  Neck: Normal range of motion.  Musculoskeletal: Normal range of motion.  Neurological: She is alert and oriented to person, place, and time.  Skin: Skin is warm and dry.  Psychiatric: She has a normal mood and affect. Her behavior is normal. Judgment and thought content normal.    Encounter Medications:   Outpatient Encounter Medications as of 06/26/2018  Medication Sig Note  . latanoprost (XALATAN) 0.005 % ophthalmic solution Place 1 drop into both eyes at bedtime. 05/10/2018: Lf 10.5.19  . aspirin 81 MG EC tablet Take 1 tablet (81 mg total) by mouth daily. (Patient not taking: Reported on 06/23/2018)   . calcitRIOL (ROCALTROL) 0.5 MCG capsule Take 4 capsules (2 mcg total) by mouth every Monday, Wednesday, and Friday with hemodialysis. (Patient not taking: Reported on 06/23/2018) 03/07/2018: Given at dialysis per pt- unable to verify dose  . diphenhydrAMINE-zinc acetate (BENADRYL) cream Apply topically 3 (three) times daily as needed for itching. (Patient not taking: Reported on  06/23/2018)   . oxyCODONE-acetaminophen (PERCOCET) 5-325 MG tablet Take 0.5-1 tablets by mouth every 4 (four) hours as needed. (Patient not taking: Reported on 06/23/2018)   . senna-docusate (SENOKOT-S) 8.6-50 MG tablet Take 1 tablet by mouth at bedtime as needed for mild constipation. (Patient not taking: Reported on 06/23/2018)    No facility-administered encounter medications on file as of 06/26/2018.     Functional Status:   In your present state of health, do you have any difficulty performing the following activities: 06/26/2018 06/25/2018  Hearing? N N  Vision? Y Y  Comment - -  Difficulty concentrating or making decisions? N Y  Comment - -  Walking or climbing stairs? Y Y  Dressing or bathing? Y Y  Doing errands, shopping? [name]  Preparing Food and eating ? Y -  Using the Toilet? Y -  In the past six months, have you accidently leaked urine? N -  Do you have problems with loss of bowel control? Y -  Managing your Medications? Y -  Managing your Finances? Y -  Housekeeping or managing your Housekeeping? Y -  Some recent data might be hidden    Fall/Depression Screening:    Fall Risk  06/26/2018 06/25/2018 05/08/2018  Falls in the past year? 1 1 (No Data)  Comment - - CONFUSED, CANNOT ANSWER THIS QUESTION  Number falls in past yr: 1 1 -  Injury with Fall? 1 1 -  Risk Factor Category  - - -  Risk for fall due to : History of fall(s);Impaired balance/gait;Impaired mobility History of fall(s);Impaired balance/gait;Impaired mobility -  Risk for fall  due to: Comment - - -  Follow up Falls evaluation completed Falls prevention discussed -   PHQ 2/9 Scores 06/26/2018 06/25/2018 04/28/2018 03/05/2018 02/12/2018 02/10/2018 01/20/2018  PHQ - 2 Score _0 0 2 0 0  PHQ- 9 Score _1 - - - -    Assessment:  Met with member and member's granddaughter at hemodialysis center. Member currently residing at her granddaughter's home; however, continued to state "I want to go home". Member  stated that she is uncomfortable staying at her granddaughter's home and the bed that she sleeps in is too small. Member c/o back pain level 9 and stated that sitting in the dialysis chair for long periods causes her back to hurt. Member made Dialysis nurse aware pain and member was repositioned. Member requesting DME such as high toilet seat; hand held shower head; shower chair; PCS Aide to assist at [facility] home.   Plan: Will follow up with member next week. Will send referral to SW.  THN CM Care Plan Problem One     Most Recent Value  Care Plan Problem One  Risk for readmission related to ESRD as evidenced by recent hospitalization  Role Documenting the Problem One  Care Management [place] for Problem One  Active  [facility] Long Term Goal   Member will not be readmitted to hospital within next 30 days.  THN Long Term Goal Start Date  06/23/18  Interventions for Problem One Long Term Goal  Face to face visit at [facility] hemodialysis appointment,  Education provided concerning importance of hemodialysis treatments,  Written EMMI education provided to member,  Assesed member's readiniess to learn  [facility] CM Short Term Goal #1   member will verbalize importance of mainitaining renal/low salt diet within next 30 days.   THN CM Short Term Goal #1 Start Date  06/23/18  Interventions for Short Term Goal #1  Discussed member's diet and appetitie  THN CM Short Term Goal #2   Member will verbalize importance of attending all Hemodialysis appointments as scheduled within next 30 days.   THN CM Short Term Goal #2 Start Date  06/23/18  Interventions for Short Term Goal #2  Provided teach back education to member regarding importance of completing entire dialysis treatments on dialysis days    [facility] CM Care Plan Problem Two     Most Recent Value  Care Plan Problem Two  Chronic Pain  Role Documenting the Problem Two  Care Management Coordinator  Care Plan for Problem Two  Active  THN CM Short Term  Goal #1   member will report decreased level back pain from 10 on scale 0-10 with next 3days  THN CM Short Term Goal #1 Start Date  06/23/18  [facility] CM Short Term Goal #1 Met Date   06/26/18  Interventions for Short Term Goal #2   member stated pain level 9 on scale 0-10.      [name], RN-BSN  [facility] Care Management  Community Care Management Coordinator  [phone] [email or web address]

## 2018-06-26 NOTE — Assessment & Plan Note (Signed)
HPI: patient presents with complaints of right sided rib pain. She states that she fell one week prior to presentation. She hit her hip, ribs, and head. She did not lose consciousness. She is unsure what caused the fall but feels she may have just lost her balance. She notes increased pain with deep inspiration and coughing. She is not tried anything for the pain as she is not sure what she can take. On physical exam she does have tenderness to palpation of the right thoracic cage. Breath sounds with good inspiration and no crackles or wheezing. She does have a bruise on her right hip but has no pain with active or passive range of motion. Her head is a traumatic.  Assessment/plan: patient presenting with right-sided chest pain after a traumatic fall. POCUS performed that did not illustrate any acute rib fractures; however, dedicated rib films were pursued. These have been reviewed and again showed no acute fracture. The patient was treated acutely with IM Toradol 30 mg.

## 2018-06-26 NOTE — Assessment & Plan Note (Signed)
HPI: Patient with ESRD on hemodialysis Monday, Wednesday, and Friday. She intermittently goes to hemodialysis. Family at bedside states that the dialysis center will no longer allow her to go without family members present. She therefore has not gone for treatment since 12/16. Her last treatment was not a full treatment she only stayed on for about an hour before signing off. She continues to state that she will do dialysis when she wants. Family is reluctant to talk about alternative options. The patient does endorse insomnia, nausea, and weight loss. There are working to get her transferred to a new dialysis center.  Assessment/plan: patient will continue her normal hemodialysis schedule and family will talk with the dialysis center social worker about transferring centers. Continue to encourage compliance as I feel the majority of her symptoms are likely due to to uremia. She is scheduled for dialysis tomorrow and does not need urgent dialysis at this point. I feel it is safe for her to follow-up as an outpatient.

## 2018-06-29 ENCOUNTER — Other Ambulatory Visit: Payer: Self-pay

## 2018-06-29 NOTE — Patient Outreach (Signed)
[place] [facility]) Care Management  06/29/2018  [place] B Alvidrez 12/11/1940 [id number]   Member requesting resumption of Personal Care Aide hours at home. [place] [employer] and spoke to [place] who stated that agency has called and spoke with member and has scheduled an appointment with member/family.  [name], RN-BSN  [facility] Care Management  Community Care Management Coordinator  [phone] [email or web address]

## 2018-06-29 NOTE — Telephone Encounter (Signed)
This encounter was created in error - please disregard.

## 2018-06-30 ENCOUNTER — Other Ambulatory Visit: Payer: Self-pay

## 2018-06-30 NOTE — Patient Outreach (Signed)
[place] [facility]) Care Management  06/30/2018  [place] B Nesheim 1941/06/02 [id number]   Received call from [facility] granddaughter [name] and HIPPA identity confirmed. Tequila requested information concerning "what to expect" in the event that member declines to continue with current hemodialysis treatment. Meeting scheduled on Monday 07-06-2018 with member and member's MD. [name] requested to be contacted via Palliative Care.   Will place referral for [facility].  [name], RN-BSN  [facility] Care Management  Community Care Management Coordinator  [phone] [email or web address]

## 2018-07-02 ENCOUNTER — Other Ambulatory Visit: Payer: Self-pay

## 2018-07-02 NOTE — Patient Outreach (Signed)
[place] [facility]) Care Management  07/02/2018  Nancy [name] 12/15/1940 [id number]  Called [facility] and spoke to [facility] per request of member's granddaughter Nancy [name] for Palliative Referral. Introduced self and HIPPA identifiers provided. Nancy [name] verbalized appreciation and stated she will contact member/Nancy [name] at phone number provided to her.   Nancy [name] made aware to call with any questions/concerns.  Will follow up with member/family 3-4 business days after member's scheduled meeting with MD Monday July 06, 2018.   [name], RN-BSN  [facility] Care Management  Community Care Management Coordinator  [phone] [email or web address]

## 2018-07-03 ENCOUNTER — Emergency Department (HOSPITAL_COMMUNITY): Payer: Medicare Other

## 2018-07-03 ENCOUNTER — Inpatient Hospital Stay (HOSPITAL_COMMUNITY)
Admission: EM | Admit: 2018-07-03 | Discharge: 2018-07-29 | DRG: 094 | Disposition: A | Discharge status: Skilled Nursing Facility | Payer: Medicare Other | Attending: Internal Medicine | Admitting: Internal Medicine

## 2018-07-03 ENCOUNTER — Ambulatory Visit: Payer: Self-pay

## 2018-07-03 ENCOUNTER — Inpatient Hospital Stay (HOSPITAL_COMMUNITY): Payer: Medicare Other

## 2018-07-03 ENCOUNTER — Other Ambulatory Visit: Payer: Self-pay

## 2018-07-03 ENCOUNTER — Other Ambulatory Visit: Payer: Self-pay | Admitting: *Deleted

## 2018-07-03 ENCOUNTER — Encounter (HOSPITAL_COMMUNITY): Payer: Self-pay | Admitting: Emergency Medicine

## 2018-07-03 DIAGNOSIS — Z992 Dependence on renal dialysis: Secondary | ICD-10-CM | POA: Diagnosis not present

## 2018-07-03 DIAGNOSIS — M5489 Other dorsalgia: Secondary | ICD-10-CM | POA: Diagnosis not present

## 2018-07-03 DIAGNOSIS — E8889 Other specified metabolic disorders: Secondary | ICD-10-CM | POA: Diagnosis present

## 2018-07-03 DIAGNOSIS — N2581 Secondary hyperparathyroidism of renal origin: Secondary | ICD-10-CM | POA: Diagnosis present

## 2018-07-03 DIAGNOSIS — R011 Cardiac murmur, unspecified: Secondary | ICD-10-CM | POA: Diagnosis not present

## 2018-07-03 DIAGNOSIS — B965 Pseudomonas (aeruginosa) (mallei) (pseudomallei) as the cause of diseases classified elsewhere: Secondary | ICD-10-CM | POA: Diagnosis present

## 2018-07-03 DIAGNOSIS — Z91048 Other nonmedicinal substance allergy status: Secondary | ICD-10-CM | POA: Diagnosis not present

## 2018-07-03 DIAGNOSIS — Y92009 Unspecified place in unspecified non-institutional (private) residence as the place of occurrence of the external cause: Secondary | ICD-10-CM

## 2018-07-03 DIAGNOSIS — M546 Pain in thoracic spine: Secondary | ICD-10-CM | POA: Diagnosis not present

## 2018-07-03 DIAGNOSIS — R2689 Other abnormalities of gait and mobility: Secondary | ICD-10-CM | POA: Diagnosis not present

## 2018-07-03 DIAGNOSIS — R1312 Dysphagia, oropharyngeal phase: Secondary | ICD-10-CM | POA: Diagnosis not present

## 2018-07-03 DIAGNOSIS — D631 Anemia in chronic kidney disease: Secondary | ICD-10-CM | POA: Diagnosis present

## 2018-07-03 DIAGNOSIS — H409 Unspecified glaucoma: Secondary | ICD-10-CM | POA: Diagnosis not present

## 2018-07-03 DIAGNOSIS — M4649 Discitis, unspecified, multiple sites in spine: Secondary | ICD-10-CM | POA: Diagnosis present

## 2018-07-03 DIAGNOSIS — Z885 Allergy status to narcotic agent status: Secondary | ICD-10-CM

## 2018-07-03 DIAGNOSIS — R079 Chest pain, unspecified: Secondary | ICD-10-CM | POA: Diagnosis not present

## 2018-07-03 DIAGNOSIS — R627 Adult failure to thrive: Secondary | ICD-10-CM | POA: Diagnosis present

## 2018-07-03 DIAGNOSIS — N186 End stage renal disease: Secondary | ICD-10-CM | POA: Diagnosis present

## 2018-07-03 DIAGNOSIS — W050XXA Fall from non-moving wheelchair, initial encounter: Secondary | ICD-10-CM | POA: Diagnosis present

## 2018-07-03 DIAGNOSIS — R531 Weakness: Secondary | ICD-10-CM | POA: Diagnosis not present

## 2018-07-03 DIAGNOSIS — Z8673 Personal history of transient ischemic attack (TIA), and cerebral infarction without residual deficits: Secondary | ICD-10-CM

## 2018-07-03 DIAGNOSIS — E569 Vitamin deficiency, unspecified: Secondary | ICD-10-CM | POA: Diagnosis not present

## 2018-07-03 DIAGNOSIS — Z5320 Procedure and treatment not carried out because of patient's decision for unspecified reasons: Secondary | ICD-10-CM | POA: Diagnosis not present

## 2018-07-03 DIAGNOSIS — Z7189 Other specified counseling: Secondary | ICD-10-CM | POA: Diagnosis not present

## 2018-07-03 DIAGNOSIS — M6281 Muscle weakness (generalized): Secondary | ICD-10-CM | POA: Diagnosis not present

## 2018-07-03 DIAGNOSIS — G062 Extradural and subdural abscess, unspecified: Secondary | ICD-10-CM | POA: Diagnosis not present

## 2018-07-03 DIAGNOSIS — D696 Thrombocytopenia, unspecified: Secondary | ICD-10-CM | POA: Diagnosis present

## 2018-07-03 DIAGNOSIS — R52 Pain, unspecified: Secondary | ICD-10-CM | POA: Diagnosis not present

## 2018-07-03 DIAGNOSIS — R64 Cachexia: Secondary | ICD-10-CM | POA: Diagnosis present

## 2018-07-03 DIAGNOSIS — G061 Intraspinal abscess and granuloma: Principal | ICD-10-CM | POA: Diagnosis present

## 2018-07-03 DIAGNOSIS — M4625 Osteomyelitis of vertebra, thoracolumbar region: Secondary | ICD-10-CM | POA: Diagnosis present

## 2018-07-03 DIAGNOSIS — I132 Hypertensive heart and chronic kidney disease with heart failure and with stage 5 chronic kidney disease, or end stage renal disease: Secondary | ICD-10-CM | POA: Diagnosis present

## 2018-07-03 DIAGNOSIS — R451 Restlessness and agitation: Secondary | ICD-10-CM | POA: Diagnosis not present

## 2018-07-03 DIAGNOSIS — Z9119 Patient's noncompliance with other medical treatment and regimen: Secondary | ICD-10-CM

## 2018-07-03 DIAGNOSIS — Z681 Body mass index (BMI) 19 or less, adult: Secondary | ICD-10-CM | POA: Diagnosis not present

## 2018-07-03 DIAGNOSIS — Z743 Need for continuous supervision: Secondary | ICD-10-CM | POA: Diagnosis not present

## 2018-07-03 DIAGNOSIS — F0391 Unspecified dementia with behavioral disturbance: Secondary | ICD-10-CM | POA: Diagnosis not present

## 2018-07-03 DIAGNOSIS — M4645 Discitis, unspecified, thoracolumbar region: Secondary | ICD-10-CM | POA: Diagnosis present

## 2018-07-03 DIAGNOSIS — D61818 Other pancytopenia: Secondary | ICD-10-CM | POA: Diagnosis present

## 2018-07-03 DIAGNOSIS — M4626 Osteomyelitis of vertebra, lumbar region: Secondary | ICD-10-CM | POA: Diagnosis not present

## 2018-07-03 DIAGNOSIS — M869 Osteomyelitis, unspecified: Secondary | ICD-10-CM

## 2018-07-03 DIAGNOSIS — Z515 Encounter for palliative care: Secondary | ICD-10-CM

## 2018-07-03 DIAGNOSIS — I5032 Chronic diastolic (congestive) heart failure: Secondary | ICD-10-CM | POA: Diagnosis present

## 2018-07-03 DIAGNOSIS — Z66 Do not resuscitate: Secondary | ICD-10-CM | POA: Diagnosis present

## 2018-07-03 DIAGNOSIS — R0781 Pleurodynia: Secondary | ICD-10-CM | POA: Diagnosis not present

## 2018-07-03 DIAGNOSIS — M4624 Osteomyelitis of vertebra, thoracic region: Secondary | ICD-10-CM | POA: Diagnosis present

## 2018-07-03 DIAGNOSIS — E43 Unspecified severe protein-calorie malnutrition: Secondary | ICD-10-CM | POA: Diagnosis present

## 2018-07-03 DIAGNOSIS — R279 Unspecified lack of coordination: Secondary | ICD-10-CM | POA: Diagnosis not present

## 2018-07-03 DIAGNOSIS — I953 Hypotension of hemodialysis: Secondary | ICD-10-CM | POA: Diagnosis not present

## 2018-07-03 DIAGNOSIS — I350 Nonrheumatic aortic (valve) stenosis: Secondary | ICD-10-CM | POA: Diagnosis present

## 2018-07-03 DIAGNOSIS — R296 Repeated falls: Secondary | ICD-10-CM | POA: Diagnosis present

## 2018-07-03 DIAGNOSIS — I639 Cerebral infarction, unspecified: Secondary | ICD-10-CM | POA: Diagnosis not present

## 2018-07-03 DIAGNOSIS — K5900 Constipation, unspecified: Secondary | ICD-10-CM | POA: Diagnosis not present

## 2018-07-03 DIAGNOSIS — I959 Hypotension, unspecified: Secondary | ICD-10-CM | POA: Diagnosis not present

## 2018-07-03 DIAGNOSIS — L0291 Cutaneous abscess, unspecified: Secondary | ICD-10-CM | POA: Diagnosis present

## 2018-07-03 DIAGNOSIS — M8448XA Pathological fracture, other site, initial encounter for fracture: Secondary | ICD-10-CM | POA: Diagnosis not present

## 2018-07-03 DIAGNOSIS — R488 Other symbolic dysfunctions: Secondary | ICD-10-CM | POA: Diagnosis not present

## 2018-07-03 DIAGNOSIS — I120 Hypertensive chronic kidney disease with stage 5 chronic kidney disease or end stage renal disease: Secondary | ICD-10-CM | POA: Diagnosis not present

## 2018-07-03 DIAGNOSIS — E1122 Type 2 diabetes mellitus with diabetic chronic kidney disease: Secondary | ICD-10-CM | POA: Diagnosis not present

## 2018-07-03 DIAGNOSIS — Z9115 Patient's noncompliance with renal dialysis: Secondary | ICD-10-CM

## 2018-07-03 DIAGNOSIS — I150 Renovascular hypertension: Secondary | ICD-10-CM | POA: Diagnosis not present

## 2018-07-03 DIAGNOSIS — S299XXA Unspecified injury of thorax, initial encounter: Secondary | ICD-10-CM | POA: Diagnosis not present

## 2018-07-03 DIAGNOSIS — M4640 Discitis, unspecified, site unspecified: Secondary | ICD-10-CM | POA: Diagnosis not present

## 2018-07-03 DIAGNOSIS — M545 Low back pain: Secondary | ICD-10-CM | POA: Diagnosis not present

## 2018-07-03 LAB — RENAL FUNCTION PANEL
Albumin: 2.1 g/dL — ABNORMAL LOW (ref 3.5–5.0)
Anion gap: 11 (ref 5–15)
BUN: 19 mg/dL (ref 8–23)
CO2: 29 mmol/L (ref 22–32)
Calcium: 8.1 mg/dL — ABNORMAL LOW (ref 8.9–10.3)
Chloride: 99 mmol/L (ref 98–111)
Creatinine, Ser: 5.37 mg/dL — ABNORMAL HIGH (ref 0.44–1.00)
GFR calc Af Amer: 8 mL/min — ABNORMAL LOW (ref >60)
GFR calc non Af Amer: 7 mL/min — ABNORMAL LOW (ref >60)
Glucose, Bld: 66 mg/dL — ABNORMAL LOW (ref 70–99)
Phosphorus: 4.3 mg/dL (ref 2.5–4.6)
Potassium: 3.5 mmol/L (ref 3.5–5.1)
Sodium: 139 mmol/L (ref 135–145)

## 2018-07-03 LAB — CBC
HEMATOCRIT: 37.3 % (ref 36.0–46.0)
Hemoglobin: 10.8 g/dL — ABNORMAL LOW (ref 12.0–15.0)
MCH: 27.5 pg (ref 26.0–34.0)
MCHC: 29 g/dL — AB (ref 30.0–36.0)
MCV: 94.9 fL (ref 80.0–100.0)
Platelets: 168 10*3/uL (ref 150–400)
RBC: 3.93 MIL/uL (ref 3.87–5.11)
RDW: 16.9 % — ABNORMAL HIGH (ref 11.5–15.5)
WBC: 4.4 10*3/uL (ref 4.0–10.5)
nRBC: 0 % (ref 0.0–0.2)

## 2018-07-03 LAB — BASIC METABOLIC PANEL
Anion gap: 19 — ABNORMAL HIGH (ref 5–15)
BUN: 52 mg/dL — ABNORMAL HIGH (ref 8–23)
CALCIUM: 9 mg/dL (ref 8.9–10.3)
CHLORIDE: 97 mmol/L — AB (ref 98–111)
CO2: 24 mmol/L (ref 22–32)
CREATININE: 10.04 mg/dL — AB (ref 0.44–1.00)
GFR calc Af Amer: 4 mL/min — ABNORMAL LOW (ref >60)
GFR calc non Af Amer: 3 mL/min — ABNORMAL LOW (ref >60)
GLUCOSE: 65 mg/dL — AB (ref 70–99)
Potassium: 5.1 mmol/L (ref 3.5–5.1)
Sodium: 140 mmol/L (ref 135–145)

## 2018-07-03 LAB — C-REACTIVE PROTEIN: CRP: 8.2 mg/dL — ABNORMAL HIGH

## 2018-07-03 LAB — SEDIMENTATION RATE: Sed Rate: 30 mm/h — ABNORMAL HIGH (ref 0–22)

## 2018-07-03 LAB — MRSA PCR SCREENING: MRSA by PCR: NEGATIVE

## 2018-07-03 MED ORDER — ACETAMINOPHEN 325 MG PO TABS
650.0000 mg | ORAL_TABLET | Freq: Four times a day (QID) | ORAL | Status: DC | PRN
  Administered 2018-07-04 – 2018-07-10 (×2): 650 mg via ORAL
  Filled 2018-07-03 (×2): qty 2

## 2018-07-03 MED ORDER — PIPERACILLIN-TAZOBACTAM 3.375 G IVPB: 3.3750 g | Freq: Two times a day (BID) | INTRAVENOUS | Status: DC

## 2018-07-03 MED ORDER — PROMETHAZINE HCL 25 MG PO TABS
12.5000 mg | ORAL_TABLET | Freq: Four times a day (QID) | ORAL | Status: DC | PRN
  Administered 2018-07-04 – 2018-07-23 (×6): 12.5 mg via ORAL
  Filled 2018-07-03 (×3): qty 1

## 2018-07-03 MED ORDER — SENNOSIDES-DOCUSATE SODIUM 8.6-50 MG PO TABS: 1.0000 | ORAL_TABLET | Freq: Every evening | ORAL | Status: DC | PRN

## 2018-07-03 MED ORDER — RENA-VITE PO TABS
1.0000 | ORAL_TABLET | Freq: Every day | ORAL | Status: DC
  Administered 2018-07-03 – 2018-07-27 (×18): 1 via ORAL
  Filled 2018-07-03 (×24): qty 1

## 2018-07-03 MED ORDER — ONDANSETRON HCL 4 MG/2ML IJ SOLN
4.0000 mg | Freq: Once | INTRAMUSCULAR | Status: AC
  Administered 2018-07-03: 4 mg via INTRAVENOUS
  Filled 2018-07-03: qty 2

## 2018-07-03 MED ORDER — HYDROMORPHONE HCL 1 MG/ML IJ SOLN
0.5000 mg | Freq: Four times a day (QID) | INTRAMUSCULAR | Status: DC | PRN
  Administered 2018-07-03 – 2018-07-06 (×10): 0.5 mg via INTRAVENOUS
  Filled 2018-07-03 (×10): qty 1

## 2018-07-03 MED ORDER — CHLORHEXIDINE GLUCONATE CLOTH 2 % EX PADS: 6.0000 | MEDICATED_PAD | Freq: Every day | CUTANEOUS | Status: DC

## 2018-07-03 MED ORDER — VANCOMYCIN HCL IN DEXTROSE 1-5 GM/200ML-% IV SOLN: 1000.0000 mg | Freq: Once | INTRAVENOUS | Status: DC

## 2018-07-03 MED ORDER — HEPARIN SODIUM (PORCINE) 5000 UNIT/ML IJ SOLN
5000.0000 [IU] | Freq: Three times a day (TID) | INTRAMUSCULAR | Status: DC
  Administered 2018-07-03 – 2018-07-29 (×33): 5000 [IU] via SUBCUTANEOUS
  Filled 2018-07-03 (×35): qty 1

## 2018-07-03 MED ORDER — LIDOCAINE-PRILOCAINE 2.5-2.5 % EX CREA: 1.0000 "application " | TOPICAL_CREAM | CUTANEOUS | Status: DC | PRN

## 2018-07-03 MED ORDER — ACETAMINOPHEN 650 MG RE SUPP: 650.0000 mg | Freq: Four times a day (QID) | RECTAL | Status: DC | PRN

## 2018-07-03 MED ORDER — VANCOMYCIN HCL 500 MG IV SOLR: 500.0000 mg | INTRAVENOUS | Status: DC

## 2018-07-03 MED ORDER — ASPIRIN EC 81 MG PO TBEC
81.0000 mg | DELAYED_RELEASE_TABLET | Freq: Every day | ORAL | Status: DC
  Administered 2018-07-03 – 2018-07-27 (×20): 81 mg via ORAL
  Filled 2018-07-03 (×22): qty 1

## 2018-07-03 MED ORDER — CALCITRIOL 0.5 MCG PO CAPS
2.0000 ug | ORAL_CAPSULE | ORAL | Status: DC
  Administered 2018-07-06 – 2018-07-17 (×4): 2 ug via ORAL
  Filled 2018-07-03 (×3): qty 4

## 2018-07-03 MED ORDER — SODIUM CHLORIDE 0.9 % IV SOLN: 100.0000 mL | INTRAVENOUS | Status: DC | PRN

## 2018-07-03 MED ORDER — LATANOPROST 0.005 % OP SOLN
1.0000 [drp] | Freq: Every day | OPHTHALMIC | Status: DC
  Administered 2018-07-05 – 2018-07-28 (×17): 1 [drp] via OPHTHALMIC
  Filled 2018-07-03 (×3): qty 2.5

## 2018-07-03 MED ORDER — PENTAFLUOROPROP-TETRAFLUOROETH EX AERO: 1.0000 "application " | INHALATION_SPRAY | CUTANEOUS | Status: DC | PRN

## 2018-07-03 MED ORDER — PRO-STAT SUGAR FREE PO LIQD
30.0000 mL | Freq: Two times a day (BID) | ORAL | Status: DC
  Administered 2018-07-03 – 2018-07-19 (×10): 30 mL via ORAL
  Filled 2018-07-03 (×28): qty 30

## 2018-07-03 MED ORDER — DIPHENHYDRAMINE-ZINC ACETATE 2-0.1 % EX CREA
TOPICAL_CREAM | Freq: Three times a day (TID) | CUTANEOUS | Status: AC | PRN
  Administered 2018-07-04 – 2018-07-11 (×3): via TOPICAL
  Filled 2018-07-03 (×2): qty 28

## 2018-07-03 MED ORDER — FERRIC CITRATE 1 GM 210 MG(FE) PO TABS
420.0000 mg | ORAL_TABLET | Freq: Three times a day (TID) | ORAL | Status: DC
  Administered 2018-07-04 – 2018-07-27 (×19): 420 mg via ORAL
  Filled 2018-07-03 (×78): qty 2

## 2018-07-03 MED ORDER — HYDROMORPHONE HCL 1 MG/ML IJ SOLN
0.5000 mg | Freq: Once | INTRAMUSCULAR | Status: AC
  Administered 2018-07-03: 0.5 mg via INTRAVENOUS
  Filled 2018-07-03: qty 1

## 2018-07-03 MED ORDER — VANCOMYCIN HCL IN DEXTROSE 1-5 GM/200ML-% IV SOLN: 1000.0000 mg | INTRAVENOUS | Status: DC

## 2018-07-03 MED ORDER — PIPERACILLIN-TAZOBACTAM 3.375 G IVPB 30 MIN: 3.3750 g | Freq: Once | INTRAVENOUS | Status: DC

## 2018-07-03 NOTE — Progress Notes (Signed)
New Admission Note:   Arrival Method: Stretcher Mental Orientation: A&O X4 Telemetry: Initiated Assessment: Completed Skin: WDL IV: WDL Pain: 10/10 Tubes: Safety Measures: Safety Fall Prevention Plan has been given, discussed and signed Admission: Completed Unit Orientation: Patient has been orientated to the room, unit and staff.   Orders have been reviewed and implemented. Will continue to monitor the patient. Call light has been placed within reach and bed alarm has been activated.    Aneta Mins BSN, RN

## 2018-07-03 NOTE — Progress Notes (Addendum)
[facility] Renal Consultation Note    Indication for Consultation:  Management of ESRD/hemodialysis; anemia, hypertension/volume and secondary hyperparathyroidism PCP:  HPI: Nancy [name] is a 77 y.o. female with ESRD on hemodialysis MWF at [facility]. PMH significant for HTN, CVA, HFpEF, AS/MR, AOCD, SHPT, Chronic back pain,  medical non-compliance. Last HD 06/26/2018.   Patient presented to ED this AM after sustaining mechanical fall at home. Apparently patient landed on R side and R back-no head trauma or loss of consciousness.Upon arrival to ED, K+ 5.1 SCr 10.04 BUN 52, Co2-24 HGB 10.8 WBC 4.4. CT of thoracic spine diskitis and osteomyelitis T11-12 with destroyed endplates and associated soft tissue mass/abcess/inflammatory phlegmon. BC ordered-to start Vanc/Zosyn per primary. She is being admitted for discitis.   Seen in room, multiple reasons for missed HD but most frequently states her back hurt, she was too weak and didn't want to come. Probably uremic-HD and serial HD is needed.   Past Medical History:  Diagnosis Date  . Anemia   . Aortic stenosis   . Bacterial sinusitis 09/17/2011  . CHF (congestive heart failure) ([place])   . CKD (chronic kidney disease) stage 4, GFR 15-29 ml/min ([place]) 08/11/2006   Cr continues to increase. Proteinuria on UA 02/10/12.    . Colitis   . CVA (cerebrovascular accident) [facility])    New hemorrhagic per CT scan '09  . Diverticulosis of colon   . Dysfunctional uterine bleeding   . ESRD (end stage renal disease) on dialysis ([place])    "MWF; [facility]" (11/27/2017)  . Fecal impaction ([place])   . Headache(784.0)   . Heart murmur   . HERNIORRHAPHY, HX OF 08/11/2006  . Hypertension   . OA (osteoarthritis)    bilateral knees  . Postmenopausal   . Pulmonary nodule   . TINEA CRURIS 01/12/2007   Past Surgical History:  Procedure Laterality Date  . ABDOMINAL HYSTERECTOMY    . AV FISTULA PLACEMENT Left 02/19/2017   Procedure:  CREATION OF LEFT ARM BRACHIOCEPHALIC ARTERIOVENOUS (AV) FISTULA;  Surgeon: Rosetta Posner, MD;  Location: [place];  Service: Vascular;  Laterality: Left;  . BASCILIC VEIN TRANSPOSITION Left 04/23/2017   Procedure: BASILIC VEIN TRANSPOSITION SECOND STAGE;  Surgeon: Rosetta Posner, MD;  Location: [place];  Service: Vascular;  Laterality: Left;  . BIOPSY  02/20/2018   Procedure: BIOPSY;  Surgeon: Carol Ada, MD;  Location: [facility];  Service: Endoscopy;;  . BIOPSY  03/07/2018   Procedure: BIOPSY;  Surgeon: Carol Ada, MD;  Location: [facility] ENDOSCOPY;  Service: Endoscopy;;  . CHOLECYSTECTOMY  2009  . COLONOSCOPY    . ESOPHAGOGASTRODUODENOSCOPY N/A 03/07/2018   Procedure: ESOPHAGOGASTRODUODENOSCOPY (EGD);  Surgeon: Carol Ada, MD;  Location: [place];  Service: Endoscopy;  Laterality: N/A;  . ESOPHAGOGASTRODUODENOSCOPY (EGD) WITH PROPOFOL N/A 02/20/2018   Procedure: ESOPHAGOGASTRODUODENOSCOPY (EGD) WITH PROPOFOL;  Surgeon: Carol Ada, MD;  Location: [facility]OS[facility];  Service: Endoscopy;  Laterality: N/A;  . INGUINAL HERNIA REPAIR  2008  . INSERTION OF DIALYSIS CATHETER Right 02/19/2017   Procedure: INSERTION OF TUNNELED DIALYSIS CATHETER - RIGHT INTERNAL JUGULAR PLACEMENT;  Surgeon: Rosetta Posner, MD;  Location: [place];  Service: Vascular;  Laterality: Right;  . IRIDOTOMY / IRIDECTOMY     Laser, right eye 12/26/11 left eye 01/24/12  . MASS EXCISION Left 05/07/2013   Procedure: EXCISION CYST;  Surgeon: Myrtha Mantis., MD;  Location: [place];  Service: Ophthalmology;  Laterality: Left;  . SAVORY DILATION N/A 02/20/2018  Procedure: SAVORY DILATION;  Surgeon: Carol Ada, MD;  Location: [facility];  Service: Endoscopy;  Laterality: N/A;   Family History  Problem Relation Age of Onset  . Hypertension Mother   . Congestive Heart Failure Mother   . Heart attack Brother 15   Social History:  reports that she has never smoked. She has never used smokeless tobacco. She  reports that she does not use drugs. No history on file for alcohol. Allergies  Allergen Reactions  . Hydrocodone Nausea And Vomiting and Other (See Comments)    Dizziness, also  . Tape Other (See Comments)    TAPE LEAVES DARK PATCHES ON THE SKIN!!   Prior to Admission medications   Medication Sig Start Date End Date Taking? Authorizing Provider  latanoprost (XALATAN) 0.005 % ophthalmic solution Place 1 drop into both eyes at bedtime. 04/11/18  Yes [provider]  aspirin 81 MG EC tablet Take 1 tablet (81 mg total) by mouth daily. Patient not taking: Reported on 06/23/2018 05/14/18   [name], MD  calcitRIOL (ROCALTROL) 0.5 MCG capsule Take 4 capsules (2 mcg total) by mouth every Monday, Wednesday, and Friday with hemodialysis. Patient not taking: Reported on 06/23/2018 12/30/17   Neva Seat, MD  diphenhydrAMINE-zinc acetate (BENADRYL) cream Apply topically 3 (three) times daily as needed for itching. Patient not taking: Reported on 06/23/2018 04/20/18   Masoudi, Dorthula Rue, MD  oxyCODONE-acetaminophen (PERCOCET) 5-325 MG tablet Take 0.5-1 tablets by mouth every 4 (four) hours as needed. Patient not taking: Reported on 06/23/2018 05/14/18   Masoudi, Dorthula Rue, MD  senna-docusate (SENOKOT-S) 8.6-50 MG tablet Take 1 tablet by mouth at bedtime as needed for mild constipation. Patient not taking: Reported on 06/23/2018 05/14/18   [name], MD   Current Facility-Administered Medications  Medication Dose Route Frequency Provider Last Rate Last Dose  . acetaminophen (TYLENOL) tablet 650 mg  650 mg Oral Q6H PRN Chundi, Vahini, MD       Or  . acetaminophen (TYLENOL) suppository 650 mg  650 mg Rectal Q6H PRN Chundi, Vahini, MD      . aspirin EC tablet 81 mg  81 mg Oral Daily Chundi, Vahini, MD      . Derrill Memo ON 07/06/2018] calcitRIOL (ROCALTROL) capsule 2 mcg  2 mcg Oral Q M,W,F-HD Chundi, Vahini, MD      . Derrill Memo ON 07/04/2018] Chlorhexidine Gluconate  Cloth 2 % PADS 6 each  6 each Topical Q0600 Valentina Gu, NP      . heparin injection 5,000 Units  5,000 Units Subcutaneous Q8H Chundi, Vahini, MD      . HYDROmorphone (DILAUDID) injection 0.5 mg  0.5 mg Intravenous Q6H PRN Chundi, Vahini, MD      . latanoprost (XALATAN) 0.005 % ophthalmic solution 1 drop  1 drop Both Eyes QHS Chundi, Vahini, MD      . multivitamin (RENA-VIT) tablet 1 tablet  1 tablet Oral QHS Renji Berwick, MD      . piperacillin-tazobactam (ZOSYN) IVPB 3.375 g  3.375 g Intravenous Once Bertis Ruddy, RPH      . [START ON 07/04/2018] piperacillin-tazobactam (ZOSYN) IVPB 3.375 g  3.375 g Intravenous Q12H Bertis Ruddy, RPH      . promethazine (PHENERGAN) tablet 12.5 mg  12.5 mg Oral Q6H PRN Chundi, Vahini, MD      . senna-docusate (Senokot-S) tablet 1 tablet  1 tablet Oral QHS PRN Chundi, Vahini, MD      . Derrill Memo ON 07/06/2018] vancomycin (VANCOCIN) 500 mg in sodium chloride 0.9 % 100  mL IVPB  500 mg Intravenous Q M,W,F-HD Bertis Ruddy, RPH      . vancomycin (VANCOCIN) IVPB 1000 mg/200 mL premix  1,000 mg Intravenous Once Bertis Ruddy, [facility]       Labs: Basic Metabolic Panel: Recent Labs  Lab 07/03/18 0736  NA 140  K 5.1  CL 97*  CO2 24  GLUCOSE 65*  BUN 52*  CREATININE 10.04*  CALCIUM 9.0   Liver Function Tests: No results for input(s): AST, ALT, ALKPHOS, BILITOT, PROT, ALBUMIN in the last 168 hours. No results for input(s): LIPASE, AMYLASE in the last 168 hours. No results for input(s): AMMONIA in the last 168 hours. CBC: Recent Labs  Lab 07/03/18 0736  WBC 4.4  HGB 10.8*  HCT 37.3  MCV 94.9  PLT 168   Cardiac Enzymes: No results for input(s): CKTOTAL, CKMB, CKMBINDEX, TROPONINI in the last 168 hours. CBG: No results for input(s): GLUCAP in the last 168 hours. Iron Studies: No results for input(s): IRON, TIBC, TRANSFERRIN, FERRITIN in the last 72 hours. Studies/Results: Dg Ribs Unilateral W/chest Right  Result Date:  07/03/2018 CLINICAL DATA:  Pain following fall EXAM: RIGHT RIBS AND CHEST - 3+ VIEW COMPARISON:  June 25, 2018 FINDINGS: Frontal chest as well as oblique and cone-down rib images were obtained. Lungs are clear. Heart is mildly enlarged with pulmonary vascularity normal. There is aortic atherosclerosis. No adenopathy. Surgical clips are noted in the left lateral axillary region. There is no appreciable acute fracture. No pneumothorax or pleural effusion. IMPRESSION: No evident rib fracture. No edema or consolidation. Heart prominent with aortic atherosclerosis noted. Aortic Atherosclerosis (ICD10-I70.0). Electronically Signed   By: Lowella Grip III M.D.   On: 07/03/2018 08:29   Dg Thoracic Spine 2 View  Result Date: 07/03/2018 CLINICAL DATA:  Pain following fall EXAM: THORACIC SPINE 3 VIEWS COMPARISON:  Chest radiograph April 21, 2018 FINDINGS: Frontal, lateral, and swimmer's views were obtained. There is mild lower thoracic dextroscoliosis. There is mild anterior wedging of the T11 vertebral body, not appreciable on most recent chest radiograph. No other fracture. No appreciable spondylolisthesis. There is mild disc space narrowing at several levels. There is aortic atherosclerosis. No paraspinous lesions or erosion. IMPRESSION: Age uncertain but suspected recent anterior wedging of the T11 vertebral body. No other evident fracture. No spondylolisthesis. There are areas of osteoarthritic change. There is aortic atherosclerosis. Aortic Atherosclerosis (ICD10-I70.0). Electronically Signed   By: Lowella Grip III M.D.   On: 07/03/2018 08:34   Dg Lumbar Spine Complete  Result Date: 07/03/2018 CLINICAL DATA:  Pain following fall EXAM: LUMBAR SPINE - COMPLETE 4+ VIEW COMPARISON:  Lumbar radiographs March 30, 2017 and lumbar spine CT May 08, 2018 FINDINGS: Frontal, lateral, spot lumbosacral lateral, and bilateral oblique views were obtained. There are 5 non-rib-bearing lumbar type  vertebral bodies. T12 rib on the left is hypoplastic. There is no fracture or spondylolisthesis. There is mild disc space narrowing at L2-3. Other disc spaces appear unremarkable. There is facet osteoarthritic change at L4-5 and L5-S1 bilaterally. There is a pelvic calcification, likely a small uterine leiomyoma. There is extensive arterial vascular calcification in the pelvis. There is also aortic atherosclerosis. IMPRESSION: No fracture or spondylolisthesis. Mild disc space narrowing at L2-3. Facet osteoarthritic change at L4-5 and L5-S1 noted bilaterally. Small calcified uterine leiomyoma in the pelvis. Extensive pelvic vascular calcification as well as aortic atherosclerosis. Aortic Atherosclerosis (ICD10-I70.0). Electronically Signed   By: Lowella Grip III M.D.   On: 07/03/2018 08:31   Ct Thoracic Spine  Wo Contrast  Result Date: 07/03/2018 CLINICAL DATA:  Severe back pain from minimal trauma. Patient fell back into wheelchair. EXAM: CT THORACIC SPINE WITHOUT CONTRAST TECHNIQUE: Multidetector CT images of the thoracic were obtained using the standard protocol without intravenous contrast. COMPARISON:  CT scan 05/08/2018 FINDINGS: Alignment: Normal overall alignment. Vertebrae: Destructive bony process at T11-12. The endplates are destroyed and there is a fairly extensive surrounding soft tissue mass or abscess. Findings are most likely due to diskitis and osteomyelitis as there were no significant abnormalities on the recent lumbar spine CT scan from November 2019 to suggest this is a neoplastic process. MRI without and with contrast may be helpful for further evaluation and to exclude the possibility of an epidural abscess. The does appear to be some bulging into the spinal canal and flattening of the thecal sac. The other vertebral bodies are intact. No acute fracture. The facets are normally aligned. No facet fractures. Paraspinal and other soft tissues: Paraspinal mass at T11-12, likely  inflammatory phlegmon and and or abscess. Bilateral pleural effusions are noted. Extensive atherosclerotic calcifications involving the thoracic aorta, mitral valve annulus and coronary arteries. Disc levels: T11 findings as detailed above. IMPRESSION: 1. CT findings most suspicious for diskitis and osteomyelitis at T11-12 with destroyed endplates and associated surrounding soft tissue mass/inflammatory phlegmon/abscess. Recommend MR imaging without and with contrast, if possible, for further evaluation and to exclude epidural abscess. 2. Normal alignment of the thoracic vertebral bodies and no acute fractures. Electronically Signed   By: Marijo Sanes M.D.   On: 07/03/2018 11:30    ROS: As per HPI otherwise negative.   Physical Exam: Vitals:   07/03/18 1130 07/03/18 1145 07/03/18 1200 07/03/18 1327  BP: (!) 143/60 140/61 (!) 143/62 (!) 137/55  Pulse: 97 93 94 93  Resp:    16  Temp:    98.1 F (36.7 C)  TempSrc:    Oral  SpO2: 100% 100% 100% 99%  Weight:         General: Frail elderly female-looks older than stated age in NAD No logical train of thought Head: Normocephalic, atraumatic, sclera non-icteric, mucus membranes are dry.  Neck: Supple. JVD not elevated.PCL Lungs: Clear bilaterally to auscultation without wheezes, rales, or rhonchi. Breathing is unlabored. Heart: Q2,V9 2/6 systolic M 2/6 diastolic M.  SR on monitor rate 90s.  2nd holosys M at apex Abdomen: Soft, non-tender, non-distended with normoactive bowel sounds. No rebound/guarding. No obvious abdominal masses.Liver down 4 cm M-S:  Strength and tone appear normal for age. Lower extremities:without edema or ischemic changes, no open wounds  Neuro: Alert and oriented X 2. Moves all extremities spontaneously. Psych:  Responds to questions appropriately with a normal affect for patient. Dialysis Access: LUA AVF + bruit  Dialysis Orders: [place] M[facility] 3.5 hrs 180NRe 400/Autoflow 1.5 45.5 kg 3.0 K/ 2.0 Ca  LUA AVF -No  heparin -Mircera 225 mcg IV (Last dose 06/02/2018 Last HGB 10.2 06/22/18) -Calcitriol 0.25 mcg PO TIW (Last PTH 136 06/02/18 Last Phos 8.1 06/22/18)     Assessment/Plan: 1.  S/P Mechanical Fall-CT findings suspicious for diskitis and osteomyelitis T11-12 with destroyged endplates and associated soft tissue mass/abcess/inflammatory phlegmon. BC ordered-to start Vanc/Zosyn per primary. No rib fx.  2.  ESRD -  MWF Last HD 06/26/18. Has not attended one treatment in past week and missed a week earlier in Dec.  S/o most tx. SCr 10 BUN 52.  uremic. Use low BFR/DFR today, serial HD needed-HD again tomorrow. K+ 5.1. Avoid dysequilibrium 3.  Hypertension/volume  - No evidence of volume overload by exam. Attempt 1.5-2 liters in HD today. Volume controlled-no antihypertensive meds on OP med list.  4.  Anemia  - HGB 10.8 which is fortunate as she has not had ESA dose since 06/02/18 D/T missed HD. Follow HGB for now. Suspect will fall after HD.  5.  Metabolic bone disease -  Last OP phos 8.1 06/22/18 Supposed to be taking [name]. Resume binders, cont VDRA.  6.  Nutrition - Renal diet-add RFP to today's labs. Prostat and renal vits. 7.  Chronic noncompliance with HD-addressed issue with pt but she has little insight into importance of dialysis and long term health. Need to consider whether continuing HD is in her best interest. 8. H/O AS/mod MS Last EF 65-70% 07/09/2017 9. H/O HF 10. H/O CVA   Rita H. Owens Shark, NP-C 07/03/2018, 1:36 PM  [employer] [phone] I have seen and examined this patient and agree with the plan of care seen, eval, counseled, discussed with residents, NP .  Mauricia Area 07/03/2018, 2:29 PM

## 2018-07-03 NOTE — Consult Note (Signed)
   [facility] Inpatient Consult   07/03/2018  Nancy [name] 08-Aug-1940 [id number]    Notified of patient's hospitalization by [facility]. [name] is active with [facility] program services.   [name] is currently in HD. Will continue to follow for progression and disposition plans. Will update [facility] RNCM accordingly.    Marthenia Rolling, MSN-Ed, RN,BSN [facility] Liaison [phone]

## 2018-07-03 NOTE — ED Triage Notes (Signed)
Pt in from home via [facility] with c/o back pain since falling back into her wheelchair today. No LOC or trauma sustained, pt's family witnessed this incident.

## 2018-07-03 NOTE — Procedures (Signed)
I was present at this session.  I have reviewed the session itself and made appropriate changes.  HD via LUA avf.  bp 120s-130s sys.  Access press ok. Jeneen Rinks Amberlyn Martinezgarcia 12/27/20192:45 PM

## 2018-07-03 NOTE — Patient Outreach (Signed)
[facility]) Care Management  07/03/2018  Nancy [name] 1941-06-28 [id number]   Noted that member presented to hospital this morning with back pain after falling back into her wheelchair.  Per ED notes, she will be admitted.  Hospital liaisons notified.  Will follow up pending disposition.  [name], [place], MSN [place] [phone]

## 2018-07-03 NOTE — Progress Notes (Signed)
Pharmacy Antibiotic Note  Nancy [name] is a 77 y.o. female admitted on 07/03/2018 with osteomyelitis.  Pharmacy has been consulted for vancomycin and zosyn dosing.  ESRD - HD usually on MWF schedule.  Plan: Vancomycin 1000mg  IV x1, then 500mg  IV post-HD Zosyn 3.375g IV x 1, then every 12 hours (4 hour infusion) F/u HD plans, Cx and clinical progression to narrow Vancomycin random level as needed  Weight: 99 lb 13.9 oz (45.3 kg)  Temp (24hrs), Avg:97.3 F (36.3 C), Min:97.3 F (36.3 C), Max:97.3 F (36.3 C)  Recent Labs  Lab 07/03/18 0736  WBC 4.4  CREATININE 10.04*    Estimated Creatinine Clearance: 3.5 mL/min (A) (by C-G formula based on SCr of 10.04 mg/dL (H)).    Allergies  Allergen Reactions  . Hydrocodone Nausea And Vomiting and Other (See Comments)    Dizziness, also  . Tape Other (See Comments)    TAPE LEAVES DARK PATCHES ON THE SKIN!!    Antimicrobials this admission: Vanc 12/27>> Zosyn 12/27>>  Dose adjustments this admission: n/a  Microbiology results: 12/27 BCx: sent  Bertis Ruddy, PharmD Clinical Pharmacist Please check AMION for all [place] numbers 07/03/2018 12:17 PM

## 2018-07-03 NOTE — H&P (Addendum)
Date: 07/03/2018               Patient Name:  Nancy [name] MRN: [id number]  DOB: 1940-12-31 Age / Sex: 77 y.o., female   PCP: Aldine Contes, MD         Medical Service: Internal Medicine Teaching Service         Attending Physician: Dr. Evette Doffing, Mallie Mussel, *    First Contact: Dr. Donne Hazel Pager: [phone]  Second Contact: Dr. Tarri Abernethy Pager: [phone]       After Hours (After 5p/  First Contact Pager: [phone]  weekends / holidays): Second Contact Pager: [phone]   Chief Complaint: Back pain  History of Present Illness: Ms. Lasker is a 77 yo female with a medical history of ESRD on HD MWF, CVA, HTN, diastolic heart failure (EF 60-65% and grade 1 diastolic dysfunction on 07/1570 ECHO), and GERD who presented to the ED with right sided trunk and back pain. She reports that she fell this morning when she tried to sit down into her wheelchair. She states the chair slipped out from under her and she landed on her right side on the hard floor. She denies hitting her head or losing consciousness. She reports severe pain on her right side and right back, however this pain has been present since at least October and has been progressively worsening. She was most recently hospitalized from 11/1-11/7 after presenting for this pain. At that time, lumbar CT showed no vertebral abnormalities. The pain improved with opioids and attributed to MSK pain from frequent falls. Most recently, a CXR on 12/19 for the same pain was negative for rib fracture. She was discharged to SNF with pain control of lidocaine patches and oxycodone. She reports that she was "kicked out" of rehab because she was soiling herself with stool. She has been living with her daughter and granddaughter. She endorses weakness and chills, but denies fevers, saddle anesthesia, and any other pain.   Of note, the patient frequently misses dialysis sessions. She reports that she was last dialyzed on Wednesday 12/18. She states that she  wants to go to dialysis, but is too weak to go and that she doesn't know when to go. She has had conversations with nephrology during past hospitalizations about her noncompliance and her goals of care.  Upon arrival to the ED, patient was afebrile and hypertensive to the 620B systolic. Labs were significant for Hb 10.8 (chronic and stable), glucose 65, BUN 52, Cr 10, anion gap 19. Chest CT with findings suspicious for discitis and osteomyelitis at T11-12. Rib Xray without evident fracture. She received dilaudid and zofran.   Meds:   No current facility-administered medications on file prior to encounter.    Current Outpatient Medications on File Prior to Encounter  Medication Sig Dispense Refill  . latanoprost (XALATAN) 0.005 % ophthalmic solution Place 1 drop into both eyes at bedtime.  6  . aspirin 81 MG EC tablet Take 1 tablet (81 mg total) by mouth daily. (Patient not taking: Reported on 06/23/2018) 30 tablet 0  . calcitRIOL (ROCALTROL) 0.5 MCG capsule Take 4 capsules (2 mcg total) by mouth every Monday, Wednesday, and Friday with hemodialysis. (Patient not taking: Reported on 06/23/2018)    . diphenhydrAMINE-zinc acetate (BENADRYL) cream Apply topically 3 (three) times daily as needed for itching. (Patient not taking: Reported on 06/23/2018) 28.4 g 0  . oxyCODONE-acetaminophen (PERCOCET) 5-325 MG tablet Take 0.5-1 tablets by mouth every 4 (four) hours as needed. (Patient not  taking: Reported on 06/23/2018) 20 tablet 0  . senna-docusate (SENOKOT-S) 8.6-50 MG tablet Take 1 tablet by mouth at bedtime as needed for mild constipation. (Patient not taking: Reported on 06/23/2018) 30 tablet 0    Allergies: Allergies as of 07/03/2018 - Review Complete 07/03/2018  Allergen Reaction Noted  . Hydrocodone Nausea And Vomiting and Other (See Comments) 12/26/2017  . Tape Other (See Comments) 04/17/2018   Past Medical History:  Diagnosis Date  . Anemia   . Aortic stenosis   . Bacterial sinusitis  09/17/2011  . CHF (congestive heart failure) ([place])   . CKD (chronic kidney disease) stage 4, GFR 15-29 ml/min ([place]) 08/11/2006   Cr continues to increase. Proteinuria on UA 02/10/12.    . Colitis   . CVA (cerebrovascular accident) [facility])    New hemorrhagic per CT scan '09  . Diverticulosis of colon   . Dysfunctional uterine bleeding   . ESRD (end stage renal disease) on dialysis ([place])    "MWF; [facility]" (11/27/2017)  . Fecal impaction ([place])   . Headache(784.0)   . Heart murmur   . HERNIORRHAPHY, HX OF 08/11/2006  . Hypertension   . OA (osteoarthritis)    bilateral knees  . Postmenopausal   . Pulmonary nodule   . TINEA CRURIS 01/12/2007   Past Surgical History:  Procedure Laterality Date  . ABDOMINAL HYSTERECTOMY    . AV FISTULA PLACEMENT Left 02/19/2017   Procedure: CREATION OF LEFT ARM BRACHIOCEPHALIC ARTERIOVENOUS (AV) FISTULA;  Surgeon: Rosetta Posner, MD;  Location: [place];  Service: Vascular;  Laterality: Left;  . BASCILIC VEIN TRANSPOSITION Left 04/23/2017   Procedure: BASILIC VEIN TRANSPOSITION SECOND STAGE;  Surgeon: Rosetta Posner, MD;  Location: [place];  Service: Vascular;  Laterality: Left;  . BIOPSY  02/20/2018   Procedure: BIOPSY;  Surgeon: Carol Ada, MD;  Location: [facility];  Service: Endoscopy;;  . BIOPSY  03/07/2018   Procedure: BIOPSY;  Surgeon: Carol Ada, MD;  Location: [facility] ENDOSCOPY;  Service: Endoscopy;;  . CHOLECYSTECTOMY  2009  . COLONOSCOPY    . ESOPHAGOGASTRODUODENOSCOPY N/A 03/07/2018   Procedure: ESOPHAGOGASTRODUODENOSCOPY (EGD);  Surgeon: Carol Ada, MD;  Location: [place];  Service: Endoscopy;  Laterality: N/A;  . ESOPHAGOGASTRODUODENOSCOPY (EGD) WITH PROPOFOL N/A 02/20/2018   Procedure: ESOPHAGOGASTRODUODENOSCOPY (EGD) WITH PROPOFOL;  Surgeon: Carol Ada, MD;  Location: [facility] ENDOSCOPY;  Service: Endoscopy;  Laterality: N/A;  . INGUINAL HERNIA REPAIR  2008  . INSERTION OF DIALYSIS CATHETER Right 02/19/2017   Procedure: INSERTION OF TUNNELED  DIALYSIS CATHETER - RIGHT INTERNAL JUGULAR PLACEMENT;  Surgeon: Rosetta Posner, MD;  Location: [place];  Service: Vascular;  Laterality: Right;  . IRIDOTOMY / IRIDECTOMY     Laser, right eye 12/26/11 left eye 01/24/12  . MASS EXCISION Left 05/07/2013   Procedure: EXCISION CYST;  Surgeon: Myrtha Mantis., MD;  Location: [place];  Service: Ophthalmology;  Laterality: Left;  . SAVORY DILATION N/A 02/20/2018   Procedure: SAVORY DILATION;  Surgeon: Carol Ada, MD;  Location: [facility] ENDOSCOPY;  Service: Endoscopy;  Laterality: N/A;    Family History: Mother has HTN. No kidney disease in the family.  Social History: Denies tobacco, etoh, and illicit drug use.  Review of Systems: A complete ROS was negative except as per HPI.   Physical Exam: Blood pressure (!) 137/55, pulse 93, temperature 98.1 F (36.7 C), temperature source Oral, resp. rate 16, weight 45.3 kg, SpO2 99 %.  Constitutional: Thin and frail. No distress. Neuro: Alert and oriented  x3. Moves all four extremities spontaneously. Answers questions appropriately and follows commands. Cardiovascular: Systolic and diastolic murmur.  Pulmonary/Chest: Effort normal. Clear to auscultation bilaterally. No wheezes, rales, or rhonchi. Abdominal: Bowel sounds present. Soft, non-distended, non-tender. Back: No deformities or skin changes. Tenderness to palpation along the right inferior rib border as well as over the lower thoracic/upper lumbar vertebral bodies. Ext: LUE AVF with bruit and palpable thrill. No lower extremity edema. Skin: Warm and dry. No rashes or wounds.  Assessment & Plan by Problem: Active Problems:   Diskitis  Discitis - Right-sided rib and back pain for three months. Prior imaging has been negative. Previously attributed to MSK pain 2/2 frequent falls.  - CT lumbar spine today with findings suspicious for discitis and osteomyelitis at T11-12 with an associated surrounding soft tissue  mass/inflammatory phlegmon/abscess. - Will obtain MRI for better characterization of possible discitis. She will likely require IR or surgical consult. - Will refrain from empiric antibiotics at this time as she is stable, afebrile, and has good strength. Would prefer to get culture data before antibiotics are started. Plan - Blood cultures - MR thoracic spine - Dilaudid 0.5mg  IV q6hrs PRN for pain - K pad for pain - PT  ESRD on HD MWF - Last dialyzed on 12/20 per nephrology (missed 3 sessions). History of frequent admissions for missed dialysis sessions. Patient has spoken with nephrology in the past about poor compliance and goals of care. This discussion should be re-initiated during this admission. - Appears euvolemic clinically. - Recent weakness and falls may be 2/2 uremia - K of 5.1 on admission. - Stable anemia 2/2 ESRD with Hb of 10.8 Plan - Nephrology is following, appreciate recs - Dialysis today - [facility] - Cacitriol - Renal vitamin  FEN: no IV fluids, renal diet with 1214ml fluid restriction, replace electrolytes as needed  DVT ppx:  heparin Code status: DNR/DNI  Dispo: Admit patient to Inpatient with expected length of stay greater than 2 midnights.  Signed: Corinne Ports, MD 07/03/2018, 2:00 PM  Pager: [phone]

## 2018-07-03 NOTE — Progress Notes (Signed)
Night team progress note: We saw Nancy [name] at bedside after being paged about, her IV access came out and she refuses a new IV. Also refuses MRI. We talked to her, she is complaining of back pain and itching on her back and arms. Explained to her that having an IV, we can give her pain med and we need the MRI to have a better idea about her pain and its cause. She agrees with plan. Also givving Benadryl. Talked to the RN about the plan.  -Topical Benadryl -IV access -F/u MRI

## 2018-07-03 NOTE — ED Notes (Signed)
Attempted to call report x 1  

## 2018-07-03 NOTE — ED Provider Notes (Signed)
[facility] EMERGENCY DEPARTMENT Provider Note   CSN: [id number] Arrival date & time: 07/03/18  3875     History   Chief Complaint Chief Complaint  Patient presents with  . Back Pain    HPI Nancy [name] is a 77 y.o. female.  HPI Patient presented to the emergency room for evaluation of back pain.  Patient has been having some trouble with weakness and falling recently.  Patient uses a wheelchair to help get around.  This morning the patient had increasing back pain after falling into her wheelchair this morning.  Patient states the pain is severe in her back.  It makes it hard for her to move around.  She denies any focal numbness or weakness she did not have any loss of consciousness.  She did not complain of any chest pain or shortness of breath.  Patient has a history of multiple medical problems.  She is on dialysis for end-stage renal disease. Past Medical History:  Diagnosis Date  . Anemia   . Aortic stenosis   . Bacterial sinusitis 09/17/2011  . CHF (congestive heart failure) ([place])   . CKD (chronic kidney disease) stage 4, GFR 15-29 ml/min ([place]) 08/11/2006   Cr continues to increase. Proteinuria on UA 02/10/12.    . Colitis   . CVA (cerebrovascular accident) [facility])    New hemorrhagic per CT scan '09  . Diverticulosis of colon   . Dysfunctional uterine bleeding   . ESRD (end stage renal disease) on dialysis ([place])    "MWF; [address]" (11/27/2017)  . Fecal impaction ([place])   . Headache(784.0)   . Heart murmur   . HERNIORRHAPHY, HX OF 08/11/2006  . Hypertension   . OA (osteoarthritis)    bilateral knees  . Postmenopausal   . Pulmonary nodule   . TINEA CRURIS 01/12/2007    Patient Active Problem List   Diagnosis Date Noted  . Rib pain 06/26/2018  . DNR (do not resuscitate) discussion   . Right renal mass   . Back pain 05/08/2018  . Chronic cough 04/17/2018  . Acute gastritis without hemorrhage   . Duodenitis with bleeding   . ESRD (end stage  renal disease) ([place])   . Papular lichenification 64/33/2951  . Acute on chronic blood loss anemia 03/06/2018  . Palliative care by specialist   . Protein-calorie malnutrition, severe 01/12/2018  . Midline thoracic back pain 12/23/2017  . Symptomatic anemia 02/17/2017  . Secondary hyperparathyroidism of renal origin ([place]) 01/15/2017  . Mitral valve annular calcification   . Dizziness 12/08/2016  . Angina at rest [facility])   . Goals of care, counseling/discussion   . Aortic stenosis 05/21/2016  . Anemia associated with chronic renal failure 02/01/2016  . Atherosclerosis of aorta ([place]) 01/11/2015  . End stage renal disease ([place]) 02/04/2013  . Non-intractable vomiting with nausea 08/17/2012  . Glaucoma 03/18/2012  . Health care maintenance 09/17/2011  . Osteoarthrosis involving lower leg 10/31/2008  . History of CVA (cerebrovascular accident) 01/28/2008  . Hyperlipidemia 02/13/2007  . PULMONARY NODULES 01/12/2007  . Left ventricular hypertrophy 09/02/2006  . Essential hypertension 08/11/2006  . GERD 08/11/2006    Past Surgical History:  Procedure Laterality Date  . ABDOMINAL HYSTERECTOMY    . AV FISTULA PLACEMENT Left 02/19/2017   Procedure: CREATION OF LEFT ARM BRACHIOCEPHALIC ARTERIOVENOUS (AV) FISTULA;  Surgeon: Rosetta Posner, MD;  Location: [facility];  Service: Vascular;  Laterality: Left;  . BASCILIC VEIN TRANSPOSITION Left 04/23/2017   Procedure: BASILIC VEIN  TRANSPOSITION SECOND STAGE;  Surgeon: Rosetta Posner, MD;  Location: [facility];  Service: Vascular;  Laterality: Left;  . BIOPSY  02/20/2018   Procedure: BIOPSY;  Surgeon: Carol Ada, MD;  Location: [facility] ENDOSCOPY;  Service: Endoscopy;;  . BIOPSY  03/07/2018   Procedure: BIOPSY;  Surgeon: Carol Ada, MD;  Location: [facility] ENDOSCOPY;  Service: Endoscopy;;  . CHOLECYSTECTOMY  2009  . COLONOSCOPY    . ESOPHAGOGASTRODUODENOSCOPY N/A 03/07/2018   Procedure: ESOPHAGOGASTRODUODENOSCOPY (EGD);  Surgeon: Carol Ada, MD;  Location: [employer] [facility];  Service: Endoscopy;  Laterality: N/A;  . ESOPHAGOGASTRODUODENOSCOPY (EGD) WITH PROPOFOL N/A 02/20/2018   Procedure: ESOPHAGOGASTRODUODENOSCOPY (EGD) WITH PROPOFOL;  Surgeon: Carol Ada, MD;  Location: [facility] ENDOSCOPY;  Service: Endoscopy;  Laterality: N/A;  . INGUINAL HERNIA REPAIR  2008  . INSERTION OF DIALYSIS CATHETER Right 02/19/2017   Procedure: INSERTION OF TUNNELED DIALYSIS CATHETER - RIGHT INTERNAL JUGULAR PLACEMENT;  Surgeon: Rosetta Posner, MD;  Location: [facility];  Service: Vascular;  Laterality: Right;  . IRIDOTOMY / IRIDECTOMY     Laser, right eye 12/26/11 left eye 01/24/12  . MASS EXCISION Left 05/07/2013   Procedure: EXCISION CYST;  Surgeon: Myrtha Mantis., MD;  Location: [facility];  Service: Ophthalmology;  Laterality: Left;  . SAVORY DILATION N/A 02/20/2018   Procedure: SAVORY DILATION;  Surgeon: Carol Ada, MD;  Location: [facility] ENDOSCOPY;  Service: Endoscopy;  Laterality: N/A;     OB History   No obstetric history on file.      Home Medications    Prior to Admission medications   Medication Sig Start Date End Date Taking? Authorizing Provider  latanoprost (XALATAN) 0.005 % ophthalmic solution Place 1 drop into both eyes at bedtime. 04/11/18  Yes [provider]  aspirin 81 MG EC tablet Take 1 tablet (81 mg total) by mouth daily. Patient not taking: Reported on 06/23/2018 05/14/18   [name], MD  calcitRIOL (ROCALTROL) 0.5 MCG capsule Take 4 capsules (2 mcg total) by mouth every Monday, Wednesday, and Friday with hemodialysis. Patient not taking: Reported on 06/23/2018 12/30/17   Neva Seat, MD  diphenhydrAMINE-zinc acetate (BENADRYL) cream Apply topically 3 (three) times daily as needed for itching. Patient not taking: Reported on 06/23/2018 04/20/18   Masoudi, Dorthula Rue, MD  oxyCODONE-acetaminophen (PERCOCET) 5-325 MG tablet Take 0.5-1 tablets by mouth every 4 (four) hours as needed. Patient not taking:  Reported on 06/23/2018 05/14/18   Masoudi, Dorthula Rue, MD  senna-docusate (SENOKOT-S) 8.6-50 MG tablet Take 1 tablet by mouth at bedtime as needed for mild constipation. Patient not taking: Reported on 06/23/2018 05/14/18   [name], MD    Family History Family History  Problem Relation Age of Onset  . Hypertension Mother   . Congestive Heart Failure Mother   . Heart attack Brother 21    Social History Social History   Tobacco Use  . Smoking status: Never Smoker  . Smokeless tobacco: Never Used  Substance Use Topics  . Alcohol use: Not on file  . Drug use: No    Comment: 08/15/08 UDS + cocaine     Allergies   Hydrocodone and Tape   Review of Systems Review of Systems  All other systems reviewed and are negative.    Physical Exam Updated Vital Signs BP (!) 143/60   Pulse 97   Temp (!) 97.3 F (36.3 C) (Oral)   Resp 16   Wt 45.3 kg   SpO2 100%   BMI 17.69 kg/m   Physical Exam Vitals  signs and nursing note reviewed.  Constitutional:      Appearance: She is well-developed. She is ill-appearing.     Comments: Thin, underweight  HENT:     Head: Normocephalic and atraumatic.     Right Ear: External ear normal.     Left Ear: External ear normal.  Eyes:     General: No scleral icterus.       Right eye: No discharge.        Left eye: No discharge.     Conjunctiva/sclera: Conjunctivae normal.  Neck:     Musculoskeletal: Neck supple.     Trachea: No tracheal deviation.  Cardiovascular:     Rate and Rhythm: Normal rate and regular rhythm.  Pulmonary:     Effort: Pulmonary effort is normal. No respiratory distress.     Breath sounds: Normal breath sounds. No stridor. No wheezing or rales.  Abdominal:     General: Bowel sounds are normal. There is no distension.     Palpations: Abdomen is soft.     Tenderness: There is no abdominal tenderness. There is no guarding or rebound.  Musculoskeletal:     Thoracic back: She exhibits tenderness.      Lumbar back: She exhibits tenderness.  Skin:    General: Skin is warm and dry.     Findings: No rash.  Neurological:     Mental Status: She is alert.     Cranial Nerves: No cranial nerve deficit (no facial droop, extraocular movements intact, no slurred speech).     Sensory: No sensory deficit.     Motor: No abnormal muscle tone or seizure activity.     Coordination: Coordination normal.      ED Treatments / Results  Labs (all labs ordered are listed, but only abnormal results are displayed) Labs Reviewed  CBC - Abnormal; Notable for the following components:      Result Value   Hemoglobin 10.8 (*)    MCHC 29.0 (*)    RDW 16.9 (*)    All other components within normal limits  BASIC METABOLIC PANEL - Abnormal; Notable for the following components:   Chloride 97 (*)    Glucose, Bld 65 (*)    BUN 52 (*)    Creatinine, Ser 10.04 (*)    GFR calc non Af Amer 3 (*)    GFR calc Af Amer 4 (*)    Anion gap 19 (*)    All other components within normal limits  CULTURE, BLOOD (ROUTINE X 2)  CULTURE, BLOOD (ROUTINE X 2)     Radiology Dg Ribs Unilateral W/chest Right  Result Date: 07/03/2018 CLINICAL DATA:  Pain following fall EXAM: RIGHT RIBS AND CHEST - 3+ VIEW COMPARISON:  June 25, 2018 FINDINGS: Frontal chest as well as oblique and cone-down rib images were obtained. Lungs are clear. Heart is mildly enlarged with pulmonary vascularity normal. There is aortic atherosclerosis. No adenopathy. Surgical clips are noted in the left lateral axillary region. There is no appreciable acute fracture. No pneumothorax or pleural effusion. IMPRESSION: No evident rib fracture. No edema or consolidation. Heart prominent with aortic atherosclerosis noted. Aortic Atherosclerosis (ICD10-I70.0). Electronically Signed   By: Lowella Grip III M.D.   On: 07/03/2018 08:29   Dg Thoracic Spine 2 View  Result Date: 07/03/2018 CLINICAL DATA:  Pain following fall EXAM: THORACIC SPINE 3 VIEWS  COMPARISON:  Chest radiograph April 21, 2018 FINDINGS: Frontal, lateral, and swimmer's views were obtained. There is mild lower thoracic dextroscoliosis. There is mild  anterior wedging of the T11 vertebral body, not appreciable on most recent chest radiograph. No other fracture. No appreciable spondylolisthesis. There is mild disc space narrowing at several levels. There is aortic atherosclerosis. No paraspinous lesions or erosion. IMPRESSION: Age uncertain but suspected recent anterior wedging of the T11 vertebral body. No other evident fracture. No spondylolisthesis. There are areas of osteoarthritic change. There is aortic atherosclerosis. Aortic Atherosclerosis (ICD10-I70.0). Electronically Signed   By: Lowella Grip III M.D.   On: 07/03/2018 08:34   Dg Lumbar Spine Complete  Result Date: 07/03/2018 CLINICAL DATA:  Pain following fall EXAM: LUMBAR SPINE - COMPLETE 4+ VIEW COMPARISON:  Lumbar radiographs March 30, 2017 and lumbar spine CT May 08, 2018 FINDINGS: Frontal, lateral, spot lumbosacral lateral, and bilateral oblique views were obtained. There are 5 non-rib-bearing lumbar type vertebral bodies. T12 rib on the left is hypoplastic. There is no fracture or spondylolisthesis. There is mild disc space narrowing at L2-3. Other disc spaces appear unremarkable. There is facet osteoarthritic change at L4-5 and L5-S1 bilaterally. There is a pelvic calcification, likely a small uterine leiomyoma. There is extensive arterial vascular calcification in the pelvis. There is also aortic atherosclerosis. IMPRESSION: No fracture or spondylolisthesis. Mild disc space narrowing at L2-3. Facet osteoarthritic change at L4-5 and L5-S1 noted bilaterally. Small calcified uterine leiomyoma in the pelvis. Extensive pelvic vascular calcification as well as aortic atherosclerosis. Aortic Atherosclerosis (ICD10-I70.0). Electronically Signed   By: Lowella Grip III M.D.   On: 07/03/2018 08:31   Ct Thoracic  Spine Wo Contrast  Result Date: 07/03/2018 CLINICAL DATA:  Severe back pain from minimal trauma. Patient fell back into wheelchair. EXAM: CT THORACIC SPINE WITHOUT CONTRAST TECHNIQUE: Multidetector CT images of the thoracic were obtained using the standard protocol without intravenous contrast. COMPARISON:  CT scan 05/08/2018 FINDINGS: Alignment: Normal overall alignment. Vertebrae: Destructive bony process at T11-12. The endplates are destroyed and there is a fairly extensive surrounding soft tissue mass or abscess. Findings are most likely due to diskitis and osteomyelitis as there were no significant abnormalities on the recent lumbar spine CT scan from November 2019 to suggest this is a neoplastic process. MRI without and with contrast may be helpful for further evaluation and to exclude the possibility of an epidural abscess. The does appear to be some bulging into the spinal canal and flattening of the thecal sac. The other vertebral bodies are intact. No acute fracture. The facets are normally aligned. No facet fractures. Paraspinal and other soft tissues: Paraspinal mass at T11-12, likely inflammatory phlegmon and and or abscess. Bilateral pleural effusions are noted. Extensive atherosclerotic calcifications involving the thoracic aorta, mitral valve annulus and coronary arteries. Disc levels: T11 findings as detailed above. IMPRESSION: 1. CT findings most suspicious for diskitis and osteomyelitis at T11-12 with destroyed endplates and associated surrounding soft tissue mass/inflammatory phlegmon/abscess. Recommend MR imaging without and with contrast, if possible, for further evaluation and to exclude epidural abscess. 2. Normal alignment of the thoracic vertebral bodies and no acute fractures. Electronically Signed   By: Marijo Sanes M.D.   On: 07/03/2018 11:30    Procedures .Critical Care Performed by: Dorie Rank, MD Authorized by: Dorie Rank, MD   Critical care provider statement:     Critical care time (minutes):  35   Critical care was time spent personally by me on the following activities:  Discussions with consultants, evaluation of patient's response to treatment, examination of patient, ordering and performing treatments and interventions, ordering and review of laboratory studies, ordering  and review of radiographic studies, pulse oximetry, re-evaluation of patient's condition, obtaining history from patient or surrogate and review of old charts   (including critical care time)  Medications Ordered in ED Medications  piperacillin-tazobactam (ZOSYN) IVPB 3.375 g (has no administration in time range)  vancomycin (VANCOCIN) IVPB 1000 mg/200 mL premix (has no administration in time range)  HYDROmorphone (DILAUDID) injection 0.5 mg (0.5 mg Intravenous Given 07/03/18 0900)  ondansetron (ZOFRAN) injection 4 mg (4 mg Intravenous Given 07/03/18 0900)     Initial Impression / Assessment and Plan / ED Course  I have reviewed the triage vital signs and the nursing notes.  Pertinent labs & imaging results that were available during my care of the patient were reviewed by me and considered in my medical decision making (see chart for details).  Clinical Course as of Jul 03 1216  Fri Jul 03, 2018  0917 Possible T11 fracture noted on plain films.  Will CT to confirm   [JK]    Clinical Course User Index [JK] Dorie Rank, MD    She presented to the ED for evaluation of falls and back pain.  Patient has a history of multiple medical problems.  She is on chronic dialysis for kidney failure.  Patient's x-rays and CT scan are concerning for the possibility of discitis osteomyelitis.  Patient is nontoxic and afebrile.  I will send off cultures order an MRI and consult the medical service for admission and further treatment.  IV antibiotics have been ordered  Final Clinical Impressions(s) / ED Diagnoses   Final diagnoses:  Discitis of thoracolumbar region       Dorie Rank,  MD 07/03/18 1217

## 2018-07-04 ENCOUNTER — Inpatient Hospital Stay (HOSPITAL_COMMUNITY): Payer: Medicare Other

## 2018-07-04 DIAGNOSIS — G062 Extradural and subdural abscess, unspecified: Secondary | ICD-10-CM | POA: Diagnosis not present

## 2018-07-04 DIAGNOSIS — Z8673 Personal history of transient ischemic attack (TIA), and cerebral infarction without residual deficits: Secondary | ICD-10-CM

## 2018-07-04 DIAGNOSIS — M4625 Osteomyelitis of vertebra, thoracolumbar region: Secondary | ICD-10-CM

## 2018-07-04 DIAGNOSIS — I120 Hypertensive chronic kidney disease with stage 5 chronic kidney disease or end stage renal disease: Secondary | ICD-10-CM

## 2018-07-04 DIAGNOSIS — Z992 Dependence on renal dialysis: Secondary | ICD-10-CM

## 2018-07-04 DIAGNOSIS — G061 Intraspinal abscess and granuloma: Principal | ICD-10-CM

## 2018-07-04 DIAGNOSIS — R011 Cardiac murmur, unspecified: Secondary | ICD-10-CM

## 2018-07-04 DIAGNOSIS — M4645 Discitis, unspecified, thoracolumbar region: Secondary | ICD-10-CM

## 2018-07-04 DIAGNOSIS — N186 End stage renal disease: Secondary | ICD-10-CM

## 2018-07-04 LAB — CBC
HCT: 41.3 % (ref 36.0–46.0)
Hemoglobin: 11.9 g/dL — ABNORMAL LOW (ref 12.0–15.0)
MCH: 27.5 pg (ref 26.0–34.0)
MCHC: 28.8 g/dL — ABNORMAL LOW (ref 30.0–36.0)
MCV: 95.6 fL (ref 80.0–100.0)
Platelets: 120 10*3/uL — ABNORMAL LOW (ref 150–400)
RBC: 4.32 MIL/uL (ref 3.87–5.11)
RDW: 16.6 % — ABNORMAL HIGH (ref 11.5–15.5)
WBC: 2.6 10*3/uL — ABNORMAL LOW (ref 4.0–10.5)
nRBC: 0 % (ref 0.0–0.2)

## 2018-07-04 LAB — COMPREHENSIVE METABOLIC PANEL
ALK PHOS: 58 U/L (ref 38–126)
ALT: 7 U/L (ref 0–44)
AST: 11 U/L — ABNORMAL LOW (ref 15–41)
Albumin: 2.3 g/dL — ABNORMAL LOW (ref 3.5–5.0)
Anion gap: 15 (ref 5–15)
BUN: 22 mg/dL (ref 8–23)
CO2: 27 mmol/L (ref 22–32)
Calcium: 8.7 mg/dL — ABNORMAL LOW (ref 8.9–10.3)
Chloride: 97 mmol/L — ABNORMAL LOW (ref 98–111)
Creatinine, Ser: 5.64 mg/dL — ABNORMAL HIGH (ref 0.44–1.00)
GFR calc Af Amer: 8 mL/min — ABNORMAL LOW (ref >60)
GFR calc non Af Amer: 7 mL/min — ABNORMAL LOW (ref >60)
Glucose, Bld: 55 mg/dL — ABNORMAL LOW (ref 70–99)
Potassium: 4.1 mmol/L (ref 3.5–5.1)
Sodium: 139 mmol/L (ref 135–145)
Total Bilirubin: 0.9 mg/dL (ref 0.3–1.2)
Total Protein: 6.3 g/dL — ABNORMAL LOW (ref 6.5–8.1)

## 2018-07-04 LAB — PHOSPHORUS: Phosphorus: 5.7 mg/dL — ABNORMAL HIGH (ref 2.5–4.6)

## 2018-07-04 MED ORDER — SODIUM CHLORIDE 0.9 % IV SOLN
2.0000 g | INTRAVENOUS | Status: DC
  Administered 2018-07-06 – 2018-07-13 (×3): 2 g via INTRAVENOUS
  Filled 2018-07-04 (×9): qty 2

## 2018-07-04 MED ORDER — SENNOSIDES-DOCUSATE SODIUM 8.6-50 MG PO TABS
1.0000 | ORAL_TABLET | Freq: Two times a day (BID) | ORAL | Status: DC
  Administered 2018-07-04 – 2018-07-27 (×28): 1 via ORAL
  Filled 2018-07-04 (×37): qty 1

## 2018-07-04 MED ORDER — HYDROMORPHONE HCL 1 MG/ML IJ SOLN
0.5000 mg | Freq: Once | INTRAMUSCULAR | Status: AC
  Administered 2018-07-04: 0.5 mg via INTRAVENOUS
  Filled 2018-07-04: qty 1

## 2018-07-04 MED ORDER — POLYETHYLENE GLYCOL 3350 17 G PO PACK
17.0000 g | PACK | Freq: Every day | ORAL | Status: DC
  Administered 2018-07-05 – 2018-07-27 (×12): 17 g via ORAL
  Filled 2018-07-04 (×14): qty 1

## 2018-07-04 MED ORDER — MIDAZOLAM HCL 2 MG/2ML IJ SOLN
INTRAMUSCULAR | Status: AC
  Filled 2018-07-04: qty 2

## 2018-07-04 MED ORDER — FENTANYL CITRATE (PF) 100 MCG/2ML IJ SOLN
INTRAMUSCULAR | Status: AC
  Filled 2018-07-04: qty 2

## 2018-07-04 MED ORDER — LIDOCAINE HCL 1 % IJ SOLN
INTRAMUSCULAR | Status: AC
  Filled 2018-07-04: qty 20

## 2018-07-04 MED ORDER — VANCOMYCIN HCL IN DEXTROSE 500-5 MG/100ML-% IV SOLN
500.0000 mg | INTRAVENOUS | Status: DC
  Filled 2018-07-04: qty 100

## 2018-07-04 MED ORDER — SODIUM CHLORIDE 0.9 % IV SOLN
2.0000 g | INTRAVENOUS | Status: AC
  Administered 2018-07-04: 2 g via INTRAVENOUS
  Filled 2018-07-04 (×2): qty 2

## 2018-07-04 MED ORDER — VANCOMYCIN HCL IN DEXTROSE 1-5 GM/200ML-% IV SOLN
1000.0000 mg | Freq: Once | INTRAVENOUS | Status: AC
  Administered 2018-07-04: 1000 mg via INTRAVENOUS
  Filled 2018-07-04: qty 200

## 2018-07-04 NOTE — Evaluation (Signed)
Physical Therapy Evaluation Patient Details Name: Nancy [name] MRN: [id number] DOB: October 26, 1940 Today's Date: 07/04/2018   History of Present Illness  77 yo female with a medical history of ESRD on HD MWF, CVA, HTN, diastolic heart failure (EF 60-65% and grade 1 diastolic dysfunction on 12/3873 ECHO), and GERD who presented to the ED with right sided trunk and back pain after she fell tried to sit down into her wheelchair. Pt also frequently misses dialysis sessions. She reports that she was last dialyzed on Wednesday 12/18.  MRI revealed T12-L1 discitis osteomyelitis with ventral epidural abscess mildly flattening the lower cord  Clinical Impression  Evaluation limited due to order for only bed level activity and pt's limited cognition with no family present. Pt reports that she lives alone and granddaughter and daughter provide assist for transfers to w/c and for bathing, dressing and food preparation. Otherwise pt is in bed and utilizes diapers for toileting. Pt with limited ability to follow commands, pt with functional ROM in bilateral LE, strength grossly assessed at 3/5 from movement, inconsistent with ability to push against therapist. Pt appears to have diminished sensation in bilateral LE, however unable to follow commands to determine extent. PT currently recommending SNF level rehab, however further evaluation of deficits is warranted once pt is released from bed rest. PT will follow acutely.    Follow Up Recommendations SNF    Equipment Recommendations  None recommended by PT    Recommendations for Other Services       Precautions / Restrictions Precautions Precautions: Fall Restrictions Weight Bearing Restrictions: No Other Position/Activity Restrictions: On bed rest      Mobility  Bed Mobility Overal bed mobility: Needs Assistance Bed Mobility: Rolling Rolling: Modified independent (Device/Increase time)         General bed mobility comments: utilizes bedrail to  pull onto L side and asked PT to scratch her back              Pertinent Vitals/Pain Pain Assessment: 0-10 Pain Score: 10-Worst pain ever Pain Location: Back Pain Descriptors / Indicators: Grimacing;Guarding Pain Intervention(s): Limited activity within patient's tolerance;Monitored during session    Home Living Family/patient expects to be discharged to:: Private residence Living Arrangements: Alone Available Help at Discharge: Family;Personal care attendant Type of Home: Apartment Home Access: Level entry     Home Layout: One level Home Equipment: Cane - single point;Walker - 2 wheels;Wheelchair - manual      Prior Function Level of Independence: Needs assistance   Gait / Transfers Assistance Needed: pt reports daughter helps to wc, otherwise bed bound  ADL's / Homemaking Assistance Needed: granddaughter and daughter bring food, and change diapers           Extremity/Trunk Assessment   Upper Extremity Assessment Upper Extremity Assessment: Difficult to assess due to impaired cognition    Lower Extremity Assessment Lower Extremity Assessment: Difficult to assess due to impaired cognition;RLE deficits/detail;LLE deficits/detail RLE Deficits / Details: ROM WFL, unable to follow commands for strength and sensation testing  LLE Deficits / Details: ROM WFL, unable to follow commands for strength and sensation testing        Communication   Communication: No difficulties  Cognition Arousal/Alertness: Awake/alert Behavior During Therapy: Flat affect Overall Cognitive Status: No family/caregiver present to determine baseline cognitive functioning Area of Impairment: Attention;Memory;Following commands;Safety/judgement;Problem solving;Orientation                 Orientation Level: Disoriented to;Situation;Time Current Attention Level: Sustained   Following Commands:  Follows one step commands inconsistently Safety/Judgement: Decreased awareness of  deficits Awareness: Intellectual Problem Solving: Slow processing;Decreased initiation;Requires verbal cues;Difficulty sequencing;Requires tactile cues General Comments: pt with difficulty following commands making evaluation of strength and sensation difficult             Assessment/Plan    PT Assessment Patient needs continued PT services  PT Problem List Decreased strength;Decreased activity tolerance;Decreased mobility;Pain       PT Treatment Interventions DME instruction;Gait training;Functional mobility training;Therapeutic activities;Therapeutic exercise;Balance training;Cognitive remediation;Patient/family education    PT Goals (Current goals can be found in the Care Plan section)  Acute Rehab PT Goals Patient Stated Goal: not stated PT Goal Formulation: Patient unable to participate in goal setting Time For Goal Achievement: 07/18/18    Frequency Min 2X/week   Barriers to discharge Decreased caregiver support         AM-PAC PT "6 Clicks" Mobility  Outcome Measure Help needed turning from your back to your side while in a flat bed without using bedrails?: A Little Help needed moving from lying on your back to sitting on the side of a flat bed without using bedrails?: A Lot Help needed moving to and from a bed to a chair (including a wheelchair)?: A Lot Help needed standing up from a chair using your arms (e.g., wheelchair or bedside chair)?: A Lot Help needed to walk in hospital room?: Total Help needed climbing 3-5 steps with a railing? : Total 6 Click Score: 11    End of Session   Activity Tolerance: Other (comment)(Evaluation limited to bed level per PT order) Patient left: in bed;with call bell/phone within reach;with bed alarm set   PT Visit Diagnosis: Other abnormalities of gait and mobility (R26.89);Pain Pain - part of body: (back)    Time: 0370-4888 PT Time Calculation (min) (ACUTE ONLY): 8 min   Charges:   PT Evaluation $PT Eval Moderate  Complexity: 1 Mod          Tranise Forrest B. Migdalia Dk PT, DPT Acute Rehabilitation Services Pager [phone] Office [phone]   [place] 07/04/2018, 11:05 AM

## 2018-07-04 NOTE — Progress Notes (Signed)
Internal Medicine Teaching Service Attending:   I saw and examined the patient. I reviewed the resident's note and I agree with the resident's findings and plan as documented in the resident's note.  Principal Problem:   Acute osteomyelitis of thoracic spine (HCC) Active Problems:   ESRD (end stage renal disease) ([place])   Epidural abscess  77 year old woman living with end-stage renal disease on hemodialysis hospital day #2 with acute osteomyelitis and discitis of T12 and L1, also complicated by a epidural abscess at that level.  Neuro exam this morning is reassuring with full strength in the upper and lower extremities, as well as full sensation.  Patient is having increasing pain.  We started empiric antibiotics with vancomycin and cefepime due to the abscess component seen on MRI.  Greatly appreciate neurosurgery consultation as well as interventional radiology performing a disc aspiration to get some culture data.  Blood cultures are pending.  Planning for medical management, holding off on surgical intervention for now.  We will do serial neuro imaging over the coming days.  Lalla Brothers, MD FACP

## 2018-07-04 NOTE — Progress Notes (Signed)
Chief Complaint: Patient was seen in consultation today for disc aspiration at the request of Dr. Lalla Brothers  Referring Physician(s):  Dr. Lalla Brothers  Supervising Physician: Markus Daft  Patient Status: [facility] - In-pt  History of Present Illness: Nancy B Saperstein is a 77 y.o. female with back pain. She is found to have evidence of discitis/osteo with abscess at the T12-L1 level. IR is asked to perform aspiration for sampling/culture. PMHx, meds, labs, imaging, allergies reviewed. Has been NPO since 0730   Past Medical History:  Diagnosis Date  . Anemia   . Aortic stenosis   . Bacterial sinusitis 09/17/2011  . CHF (congestive heart failure) ([place])   . CKD (chronic kidney disease) stage 4, GFR 15-29 ml/min ([place]) 08/11/2006   Cr continues to increase. Proteinuria on UA 02/10/12.    . Colitis   . CVA (cerebrovascular accident) [facility])    New hemorrhagic per CT scan '09  . Diverticulosis of colon   . Dysfunctional uterine bleeding   . ESRD (end stage renal disease) on dialysis ([place])    "MWF; [facility]" (11/27/2017)  . Fecal impaction ([place])   . Headache(784.0)   . Heart murmur   . HERNIORRHAPHY, HX OF 08/11/2006  . Hypertension   . OA (osteoarthritis)    bilateral knees  . Postmenopausal   . Pulmonary nodule   . TINEA CRURIS 01/12/2007    Past Surgical History:  Procedure Laterality Date  . ABDOMINAL HYSTERECTOMY    . AV FISTULA PLACEMENT Left 02/19/2017   Procedure: CREATION OF LEFT ARM BRACHIOCEPHALIC ARTERIOVENOUS (AV) FISTULA;  Surgeon: Rosetta Posner, MD;  Location: [place];  Service: Vascular;  Laterality: Left;  . BASCILIC VEIN TRANSPOSITION Left 04/23/2017   Procedure: BASILIC VEIN TRANSPOSITION SECOND STAGE;  Surgeon: Rosetta Posner, MD;  Location: [place];  Service: Vascular;  Laterality: Left;  . BIOPSY  02/20/2018   Procedure: BIOPSY;  Surgeon: Carol Ada, MD;  Location: [facility];  Service: Endoscopy;;  . BIOPSY  03/07/2018   Procedure: BIOPSY;  Surgeon:  Carol Ada, MD;  Location: [facility] ENDOSCOPY;  Service: Endoscopy;;  . CHOLECYSTECTOMY  2009  . COLONOSCOPY    . ESOPHAGOGASTRODUODENOSCOPY N/A 03/07/2018   Procedure: ESOPHAGOGASTRODUODENOSCOPY (EGD);  Surgeon: Carol Ada, MD;  Location: [place];  Service: Endoscopy;  Laterality: N/A;  . ESOPHAGOGASTRODUODENOSCOPY (EGD) WITH PROPOFOL N/A 02/20/2018   Procedure: ESOPHAGOGASTRODUODENOSCOPY (EGD) WITH PROPOFOL;  Surgeon: Carol Ada, MD;  Location: [facility] ENDOSCOPY;  Service: Endoscopy;  Laterality: N/A;  . INGUINAL HERNIA REPAIR  2008  . INSERTION OF DIALYSIS CATHETER Right 02/19/2017   Procedure: INSERTION OF TUNNELED DIALYSIS CATHETER - RIGHT INTERNAL JUGULAR PLACEMENT;  Surgeon: Rosetta Posner, MD;  Location: [place];  Service: Vascular;  Laterality: Right;  . IRIDOTOMY / IRIDECTOMY     Laser, right eye 12/26/11 left eye 01/24/12  . MASS EXCISION Left 05/07/2013   Procedure: EXCISION CYST;  Surgeon: Myrtha Mantis., MD;  Location: [place];  Service: Ophthalmology;  Laterality: Left;  . SAVORY DILATION N/A 02/20/2018   Procedure: SAVORY DILATION;  Surgeon: Carol Ada, MD;  Location: [facility] ENDOSCOPY;  Service: Endoscopy;  Laterality: N/A;    Allergies: Hydrocodone and Tape  Medications:  Current Facility-Administered Medications:  .  acetaminophen (TYLENOL) tablet 650 mg, 650 mg, Oral, Q6H PRN, 650 mg at 07/04/18 0507 **OR** acetaminophen (TYLENOL) suppository 650 mg, 650 mg, Rectal, Q6H PRN, Chundi, Vahini, MD .  aspirin EC tablet 81 mg, 81 mg, Oral, Daily, Chundi, Vahini,  MD, 81 mg at 07/04/18 0940 .  [START ON 07/06/2018] calcitRIOL (ROCALTROL) capsule 2 mcg, 2 mcg, Oral, Q M,W,F-HD, Chundi, Vahini, MD .  Derrill Memo ON 07/06/2018] ceFEPIme (MAXIPIME) 2 g in sodium chloride 0.9 % 100 mL IVPB, 2 g, Intravenous, Q M,W,F-HD, Evette Doffing, Mallie Mussel, MD .  Chlorhexidine Gluconate Cloth 2 % PADS 6 each, 6 each, Topical, Q0600, Valentina Gu, NP .  diphenhydrAMINE-zinc  acetate (BENADRYL) 2-0.1 % cream, , Topical, TID PRN, Neva Seat, MD .  feeding supplement (PRO-STAT SUGAR FREE 64) liquid 30 mL, 30 mL, Oral, BID, Valentina Gu, NP, 30 mL at 07/03/18 2137 .  ferric citrate (AURYXIA) tablet 420 mg, 420 mg, Oral, TID WC, Valentina Gu, NP, 420 mg at 07/04/18 0755 .  heparin injection 5,000 Units, 5,000 Units, Subcutaneous, Q8H, Chundi, Vahini, MD, 5,000 Units at 07/03/18 2137 .  HYDROmorphone (DILAUDID) injection 0.5 mg, 0.5 mg, Intravenous, Q6H PRN, Chundi, Vahini, MD, 0.5 mg at 07/04/18 0755 .  latanoprost (XALATAN) 0.005 % ophthalmic solution 1 drop, 1 drop, Both Eyes, QHS, Chundi, Vahini, MD .  multivitamin (RENA-VIT) tablet 1 tablet, 1 tablet, Oral, QHS, Deterding, Jeneen Rinks, MD, 1 tablet at 07/03/18 2137 .  promethazine (PHENERGAN) tablet 12.5 mg, 12.5 mg, Oral, Q6H PRN, Chundi, Vahini, MD, 12.5 mg at 07/04/18 0941 .  senna-docusate (Senokot-S) tablet 1 tablet, 1 tablet, Oral, QHS PRN, Chundi, Vahini, MD .  vancomycin (VANCOCIN) IVPB 1000 mg/200 mL premix, 1,000 mg, Intravenous, Once, Axel Filler, MD .  Derrill Memo ON 07/06/2018] vancomycin (VANCOCIN) IVPB 500 mg/100 ml premix, 500 mg, Intravenous, Q M,W,F-HD, Evette Doffing Mallie Mussel, MD    Family History  Problem Relation Age of Onset  . Hypertension Mother   . Congestive Heart Failure Mother   . Heart attack Brother 60    Social History   Socioeconomic History  . Marital status: Widowed    Spouse name: Not on file  . Number of children: Not on file  . Years of education: Not on file  . Highest education level: Not on file  Occupational History  . Not on file  Social Needs  . Financial resource strain: Not on file  . Food insecurity:    Worry: Never true    Inability: Never true  . Transportation needs:    Medical: No    Non-medical: No  Tobacco Use  . Smoking status: Never Smoker  . Smokeless tobacco: Never Used  Substance and Sexual Activity  . Alcohol use: Not  on file  . Drug use: No    Comment: 08/15/08 UDS + cocaine  . Sexual activity: Not Currently  Lifestyle  . Physical activity:    Days per week: 0 days    Minutes per session: 0 min  . Stress: Not on file  Relationships  . Social connections:    Talks on phone: More than three times a week    Gets together: More than three times a week    Attends religious service: More than 4 times per year    Active member of club or organization: No    Attends meetings of clubs or organizations: Never    Relationship status: Widowed  Other Topics Concern  . Not on file  Social History Narrative   Member is widowed, has 1 daughter and 1 granddaughter and 3 great grandchildren        Review of Systems: A 12 point ROS discussed and pertinent positives are indicated in the HPI above.  All other systems  are negative.  Review of Systems  Vital Signs: BP (!) 136/54 (BP Location: Right Arm)   Pulse 83   Temp 97.6 F (36.4 C) (Oral)   Resp 18   Ht 5\' 3"  (1.6 m)   Wt 42.1 kg   SpO2 100%   BMI 16.44 kg/m   Physical Exam HENT:     Mouth/Throat:     Mouth: Mucous membranes are moist.     Pharynx: Oropharynx is clear.  Cardiovascular:     Rate and Rhythm: Normal rate and regular rhythm.     Heart sounds: Normal heart sounds.  Pulmonary:     Effort: Pulmonary effort is normal. No respiratory distress.     Breath sounds: Normal breath sounds.  Musculoskeletal: Normal range of motion.        General: Tenderness present.     Comments: Tender lower back.  Neurological:     General: No focal deficit present.     Mental Status: She is alert and oriented to person, place, and time.  Psychiatric:        Mood and Affect: Mood normal.        Judgment: Judgment normal.      Imaging: Dg Ribs Unilateral W/chest Right  Result Date: 07/03/2018 CLINICAL DATA:  Pain following fall EXAM: RIGHT RIBS AND CHEST - 3+ VIEW COMPARISON:  June 25, 2018 FINDINGS: Frontal chest as well as oblique and  cone-down rib images were obtained. Lungs are clear. Heart is mildly enlarged with pulmonary vascularity normal. There is aortic atherosclerosis. No adenopathy. Surgical clips are noted in the left lateral axillary region. There is no appreciable acute fracture. No pneumothorax or pleural effusion. IMPRESSION: No evident rib fracture. No edema or consolidation. Heart prominent with aortic atherosclerosis noted. Aortic Atherosclerosis (ICD10-I70.0). Electronically Signed   By: Lowella Grip III M.D.   On: 07/03/2018 08:29   Dg Ribs Bilateral  Result Date: 06/25/2018 CLINICAL DATA:  Bilateral rib pain and shortness of breath after fall last week. EXAM: BILATERAL RIBS - 3+ VIEW COMPARISON:  Radiographs of May 06, 2018. FINDINGS: No fracture or other bone lesions are seen involving the ribs. IMPRESSION: Negative. Electronically Signed   By: Marijo Conception, M.D.   On: 06/25/2018 16:34   Dg Thoracic Spine 2 View  Result Date: 07/03/2018 CLINICAL DATA:  Pain following fall EXAM: THORACIC SPINE 3 VIEWS COMPARISON:  Chest radiograph April 21, 2018 FINDINGS: Frontal, lateral, and swimmer's views were obtained. There is mild lower thoracic dextroscoliosis. There is mild anterior wedging of the T11 vertebral body, not appreciable on most recent chest radiograph. No other fracture. No appreciable spondylolisthesis. There is mild disc space narrowing at several levels. There is aortic atherosclerosis. No paraspinous lesions or erosion. IMPRESSION: Age uncertain but suspected recent anterior wedging of the T11 vertebral body. No other evident fracture. No spondylolisthesis. There are areas of osteoarthritic change. There is aortic atherosclerosis. Aortic Atherosclerosis (ICD10-I70.0). Electronically Signed   By: Lowella Grip III M.D.   On: 07/03/2018 08:34   Dg Lumbar Spine Complete  Result Date: 07/03/2018 CLINICAL DATA:  Pain following fall EXAM: LUMBAR SPINE - COMPLETE 4+ VIEW COMPARISON:  Lumbar  radiographs March 30, 2017 and lumbar spine CT May 08, 2018 FINDINGS: Frontal, lateral, spot lumbosacral lateral, and bilateral oblique views were obtained. There are 5 non-rib-bearing lumbar type vertebral bodies. T12 rib on the left is hypoplastic. There is no fracture or spondylolisthesis. There is mild disc space narrowing at L2-3. Other disc spaces appear unremarkable.  There is facet osteoarthritic change at L4-5 and L5-S1 bilaterally. There is a pelvic calcification, likely a small uterine leiomyoma. There is extensive arterial vascular calcification in the pelvis. There is also aortic atherosclerosis. IMPRESSION: No fracture or spondylolisthesis. Mild disc space narrowing at L2-3. Facet osteoarthritic change at L4-5 and L5-S1 noted bilaterally. Small calcified uterine leiomyoma in the pelvis. Extensive pelvic vascular calcification as well as aortic atherosclerosis. Aortic Atherosclerosis (ICD10-I70.0). Electronically Signed   By: Lowella Grip III M.D.   On: 07/03/2018 08:31   Ct Thoracic Spine Wo Contrast  Result Date: 07/03/2018 CLINICAL DATA:  Severe back pain from minimal trauma. Patient fell back into wheelchair. EXAM: CT THORACIC SPINE WITHOUT CONTRAST TECHNIQUE: Multidetector CT images of the thoracic were obtained using the standard protocol without intravenous contrast. COMPARISON:  CT scan 05/08/2018 FINDINGS: Alignment: Normal overall alignment. Vertebrae: Destructive bony process at T11-12. The endplates are destroyed and there is a fairly extensive surrounding soft tissue mass or abscess. Findings are most likely due to diskitis and osteomyelitis as there were no significant abnormalities on the recent lumbar spine CT scan from November 2019 to suggest this is a neoplastic process. MRI without and with contrast may be helpful for further evaluation and to exclude the possibility of an epidural abscess. The does appear to be some bulging into the spinal canal and flattening of  the thecal sac. The other vertebral bodies are intact. No acute fracture. The facets are normally aligned. No facet fractures. Paraspinal and other soft tissues: Paraspinal mass at T11-12, likely inflammatory phlegmon and and or abscess. Bilateral pleural effusions are noted. Extensive atherosclerotic calcifications involving the thoracic aorta, mitral valve annulus and coronary arteries. Disc levels: T11 findings as detailed above. IMPRESSION: 1. CT findings most suspicious for diskitis and osteomyelitis at T11-12 with destroyed endplates and associated surrounding soft tissue mass/inflammatory phlegmon/abscess. Recommend MR imaging without and with contrast, if possible, for further evaluation and to exclude epidural abscess. 2. Normal alignment of the thoracic vertebral bodies and no acute fractures. Electronically Signed   By: Marijo Sanes M.D.   On: 07/03/2018 11:30   Mr Thoracic Spine Wo Contrast  Result Date: 07/04/2018 CLINICAL DATA:  Back pain.  Pathological fracture. EXAM: MRI THORACIC SPINE WITHOUT CONTRAST TECHNIQUE: Multiplanar, multisequence MR imaging of the thoracic spine was performed. No intravenous contrast was administered. COMPARISON:  Thoracic spine CT 07/03/2018 FINDINGS: Alignment:  Negative for listhesis. Vertebrae: T2 hyperintense T12-L1 disc space (discordant numbering compared to prior thoracic spine CT, more reliable on this study due to coverage of the cervical spine) with endplate destruction and marrow edema. Findings are consistent with discitis and osteomyelitis. There is paravertebral inflammation. Disc purulence decompresses into the ventral epidural space with posterior displacement and mild flattening of the cord. Purulence also narrows both T12-L1 foramina. No other level of infection. No acute fracture or evidence of malignancy. Cord:  Normal signal. Paraspinal and other soft tissues: Paravertebral inflammation as noted above. Small layering pleural effusions. Renal  atrophy with thick walled cystic intensity on the right. Disc levels: Infectious changes noted above. Otherwise there is usual degenerative changes in the thoracic spine without degenerative impingement. IMPRESSION: T12-L1 discitis osteomyelitis (please note different numbering than on prior thoracic spine CT) with ventral epidural abscess mildly flattening the lower cord. Electronically Signed   By: Monte Fantasia M.D.   On: 07/04/2018 07:34    Labs:  CBC: Recent Labs    05/11/18 1203 05/13/18 0937 07/03/18 0736 07/04/18 0359  WBC 7.6 6.6 4.4  2.6*  HGB 9.5* 9.5* 10.8* 11.9*  HCT 31.3* 31.8* 37.3 41.3  PLT 216 206 168 120*    COAGS: Recent Labs    03/06/18 1345 03/29/18 1113  INR 1.03 1.13    BMP: Recent Labs    05/13/18 0936 07/03/18 0736 07/03/18 1909 07/04/18 0359  NA 132* 140 139 139  K 3.7 5.1 3.5 4.1  CL 96* 97* 99 97*  CO2 26 24 29 27   GLUCOSE 91 65* 66* 55*  BUN 36* 52* 19 22  CALCIUM 9.0 9.0 8.1* 8.7*  CREATININE 6.03* 10.04* 5.37* 5.64*  GFRNONAA 6* 3* 7* 7*  GFRAA 7* 4* 8* 8*    LIVER FUNCTION TESTS: Recent Labs    03/06/18 1250 04/17/18 1826  04/22/18 1800  05/11/18 1203 05/13/18 0936 07/03/18 1909 07/04/18 0359  BILITOT 0.7 1.0  --  0.5  --   --   --   --  0.9  AST 12* 14*  --  12*  --   --   --   --  11*  ALT 8 8  --  9  --   --   --   --  7  ALKPHOS 34* 48  --  44  --   --   --   --  58  PROT 5.6* 6.2*  --  6.3*  --   --   --   --  6.3*  ALBUMIN 3.0* 3.1*   < > 3.0*   < > 2.2* 2.1* 2.1* 2.3*   < > = values in this interval not displayed.    TUMOR MARKERS: No results for input(s): AFPTM, CEA, CA199, CHROMGRNA in the last 8760 hours.  Assessment and Plan: Back pain with discitis/osteo/abscess at the T12-L1 level. Imaging reviewed, amenable to CT guided aspiration. Labs ok Risks and benefits discussed with the patient including bleeding, infection, damage to adjacent structures, sepsis, and low yield results.  All of the patient's  questions were answered, patient is agreeable to proceed. Consent signed and in chart.    Thank you for this interesting consult.  I greatly enjoyed meeting Nancy [name] and look forward to participating in their care.  A copy of this report was sent to the requesting provider on this date.  Electronically Signed: Ascencion Dike, PA-C 07/04/2018, 12:14 PM   I spent a total of 20 minutes in face to face in clinical consultation, greater than 50% of which was counseling/coordinating care for disc aspiration

## 2018-07-04 NOTE — Progress Notes (Signed)
Pharmacy Antibiotic Note  Nancy [name] is a 77 y.o. female admitted on 07/03/2018 with back pain.  Pharmacy has been consulted for Cefepime and vancomycin dosing for Osteomyelitis. ESRD - HD MWF,   HD done Fri 12/27  Plan: Cefepime 2g IV qHD - MWF, give dose now Vancomycin 1g IV x1 now then 500 mg IV qHD-MWF Monitor clinical status, renal function and culture results daily.  vanc level pre HD as needed per protocol  Height: 5\' 3"  (160 cm) Weight: 92 lb 13 oz (42.1 kg) IBW/kg (Calculated) : 52.4  Temp (24hrs), Avg:97.8 F (36.6 C), Min:97.5 F (36.4 C), Max:98.1 F (36.7 C)  Recent Labs  Lab 07/03/18 0736 07/03/18 1909 07/04/18 0359  WBC 4.4  --  2.6*  CREATININE 10.04* 5.37* 5.64*    Estimated Creatinine Clearance: 5.7 mL/min (A) (by C-G formula based on SCr of 5.64 mg/dL (H)).    Allergies  Allergen Reactions  . Hydrocodone Nausea And Vomiting and Other (See Comments)    Dizziness, also  . Tape Other (See Comments)    TAPE LEAVES DARK PATCHES ON THE SKIN!!    Thank you for allowing pharmacy to be a part of this patient's care.  Nicole Cella, RPh Clinical Pharmacist Please check AMION for all [place] phone numbers After 10:00 PM, call [place] [phone] 07/04/2018 10:41 AM

## 2018-07-04 NOTE — Consult Note (Signed)
Chief Complaint   Chief Complaint  Patient presents with  . Back Pain    HPI   Reason for consult: Thoracolumbar discitis   HPI: Nancy [name] is a 77 y.o. female who presented to ER this past Thursday after falling backwards into her wheelchair. Since then she has been having severe worsening low back pain. Pain is worse with any movement. Does have baseline chronic back pain stemming back several months without any known cause. Has been managing conservatively with po medications. Was recently hospitalized from 11/1-11/7 although CT T/L spine was normal. She endorses pain in her left knee radiating up to her hip. She denies true radicular symptoms or N/T in extremities. She endorses generalized weakness but no focal deficits. No fever.  Past medical history and medications reviewed.  Patient Active Problem List   Diagnosis Date Noted  . Diskitis 07/03/2018  . Rib pain 06/26/2018  . DNR (do not resuscitate) discussion   . Right renal mass   . Back pain 05/08/2018  . Chronic cough 04/17/2018  . Acute gastritis without hemorrhage   . Duodenitis with bleeding   . ESRD (end stage renal disease) ([place])   . Papular lichenification 99/83/3825  . Acute on chronic blood loss anemia 03/06/2018  . Palliative care by specialist   . Protein-calorie malnutrition, severe 01/12/2018  . Midline thoracic back pain 12/23/2017  . Symptomatic anemia 02/17/2017  . Secondary hyperparathyroidism of renal origin ([place]) 01/15/2017  . Mitral valve annular calcification   . Dizziness 12/08/2016  . Angina at rest [facility])   . Goals of care, counseling/discussion   . Aortic stenosis 05/21/2016  . Anemia associated with chronic renal failure 02/01/2016  . Atherosclerosis of aorta ([place]) 01/11/2015  . End stage renal disease ([place]) 02/04/2013  . Non-intractable vomiting with nausea 08/17/2012  . Glaucoma 03/18/2012  . Health care maintenance 09/17/2011  . Osteoarthrosis involving lower leg 10/31/2008    . History of CVA (cerebrovascular accident) 01/28/2008  . Hyperlipidemia 02/13/2007  . PULMONARY NODULES 01/12/2007  . Left ventricular hypertrophy 09/02/2006  . Essential hypertension 08/11/2006  . GERD 08/11/2006    PMH: Past Medical History:  Diagnosis Date  . Anemia   . Aortic stenosis   . Bacterial sinusitis 09/17/2011  . CHF (congestive heart failure) ([place])   . CKD (chronic kidney disease) stage 4, GFR 15-29 ml/min ([place]) 08/11/2006   Cr continues to increase. Proteinuria on UA 02/10/12.    . Colitis   . CVA (cerebrovascular accident) [facility])    New hemorrhagic per CT scan '09  . Diverticulosis of colon   . Dysfunctional uterine bleeding   . ESRD (end stage renal disease) on dialysis ([place])    "MWF; [facility]" (11/27/2017)  . Fecal impaction ([place])   . Headache(784.0)   . Heart murmur   . HERNIORRHAPHY, HX OF 08/11/2006  . Hypertension   . OA (osteoarthritis)    bilateral knees  . Postmenopausal   . Pulmonary nodule   . TINEA CRURIS 01/12/2007    PSH: Past Surgical History:  Procedure Laterality Date  . ABDOMINAL HYSTERECTOMY    . AV FISTULA PLACEMENT Left 02/19/2017   Procedure: CREATION OF LEFT ARM BRACHIOCEPHALIC ARTERIOVENOUS (AV) FISTULA;  Surgeon: Rosetta Posner, MD;  Location: [place];  Service: Vascular;  Laterality: Left;  . BASCILIC VEIN TRANSPOSITION Left 04/23/2017   Procedure: BASILIC VEIN TRANSPOSITION SECOND STAGE;  Surgeon: Rosetta Posner, MD;  Location: [place];  Service: Vascular;  Laterality: Left;  . BIOPSY  02/20/2018   Procedure: BIOPSY;  Surgeon: Carol Ada, MD;  Location: [facility];  Service: Endoscopy;;  . BIOPSY  03/07/2018   Procedure: BIOPSY;  Surgeon: Carol Ada, MD;  Location: [facility] ENDOSCOPY;  Service: Endoscopy;;  . CHOLECYSTECTOMY  2009  . COLONOSCOPY    . ESOPHAGOGASTRODUODENOSCOPY N/A 03/07/2018   Procedure: ESOPHAGOGASTRODUODENOSCOPY (EGD);  Surgeon: Carol Ada, MD;  Location: [place];  Service: Endoscopy;  Laterality: N/A;  .  ESOPHAGOGASTRODUODENOSCOPY (EGD) WITH PROPOFOL N/A 02/20/2018   Procedure: ESOPHAGOGASTRODUODENOSCOPY (EGD) WITH PROPOFOL;  Surgeon: Carol Ada, MD;  Location: [facility] ENDOSCOPY;  Service: Endoscopy;  Laterality: N/A;  . INGUINAL HERNIA REPAIR  2008  . INSERTION OF DIALYSIS CATHETER Right 02/19/2017   Procedure: INSERTION OF TUNNELED DIALYSIS CATHETER - RIGHT INTERNAL JUGULAR PLACEMENT;  Surgeon: Rosetta Posner, MD;  Location: [place];  Service: Vascular;  Laterality: Right;  . IRIDOTOMY / IRIDECTOMY     Laser, right eye 12/26/11 left eye 01/24/12  . MASS EXCISION Left 05/07/2013   Procedure: EXCISION CYST;  Surgeon: Myrtha Mantis., MD;  Location: [place];  Service: Ophthalmology;  Laterality: Left;  . SAVORY DILATION N/A 02/20/2018   Procedure: SAVORY DILATION;  Surgeon: Carol Ada, MD;  Location: [facility] ENDOSCOPY;  Service: Endoscopy;  Laterality: N/A;    Medications Prior to Admission  Medication Sig Dispense Refill Last Dose  . latanoprost (XALATAN) 0.005 % ophthalmic solution Place 1 drop into both eyes at bedtime.  6 Past Week at Unknown time  . aspirin 81 MG EC tablet Take 1 tablet (81 mg total) by mouth daily. (Patient not taking: Reported on 06/23/2018) 30 tablet 0 Not Taking at Unknown time  . calcitRIOL (ROCALTROL) 0.5 MCG capsule Take 4 capsules (2 mcg total) by mouth every Monday, Wednesday, and Friday with hemodialysis. (Patient not taking: Reported on 06/23/2018)   Not Taking at Unknown time  . diphenhydrAMINE-zinc acetate (BENADRYL) cream Apply topically 3 (three) times daily as needed for itching. (Patient not taking: Reported on 06/23/2018) 28.4 g 0 Not Taking at Unknown time  . oxyCODONE-acetaminophen (PERCOCET) 5-325 MG tablet Take 0.5-1 tablets by mouth every 4 (four) hours as needed. (Patient not taking: Reported on 06/23/2018) 20 tablet 0 Not Taking at Unknown time  . senna-docusate (SENOKOT-S) 8.6-50 MG tablet Take 1 tablet by mouth at bedtime as needed for  mild constipation. (Patient not taking: Reported on 06/23/2018) 30 tablet 0 Not Taking at Unknown time    SH: Social History   Tobacco Use  . Smoking status: Never Smoker  . Smokeless tobacco: Never Used  Substance Use Topics  . Alcohol use: Not on file  . Drug use: No    Comment: 08/15/08 UDS + cocaine    MEDS: Prior to Admission medications   Medication Sig Start Date End Date Taking? Authorizing Provider  latanoprost (XALATAN) 0.005 % ophthalmic solution Place 1 drop into both eyes at bedtime. 04/11/18  Yes [provider]  aspirin 81 MG EC tablet Take 1 tablet (81 mg total) by mouth daily. Patient not taking: Reported on 06/23/2018 05/14/18   [name], MD  calcitRIOL (ROCALTROL) 0.5 MCG capsule Take 4 capsules (2 mcg total) by mouth every Monday, Wednesday, and Friday with hemodialysis. Patient not taking: Reported on 06/23/2018 12/30/17   Neva Seat, MD  diphenhydrAMINE-zinc acetate (BENADRYL) cream Apply topically 3 (three) times daily as needed for itching. Patient not taking: Reported on 06/23/2018 04/20/18   Masoudi, Dorthula Rue, MD  oxyCODONE-acetaminophen (PERCOCET) 5-325 MG tablet Take 0.5-1 tablets  by mouth every 4 (four) hours as needed. Patient not taking: Reported on 06/23/2018 05/14/18   Masoudi, Dorthula Rue, MD  senna-docusate (SENOKOT-S) 8.6-50 MG tablet Take 1 tablet by mouth at bedtime as needed for mild constipation. Patient not taking: Reported on 06/23/2018 05/14/18   [name], MD    ALLERGY: Allergies  Allergen Reactions  . Hydrocodone Nausea And Vomiting and Other (See Comments)    Dizziness, also  . Tape Other (See Comments)    TAPE LEAVES DARK PATCHES ON THE SKIN!!    Social History   Tobacco Use  . Smoking status: Never Smoker  . Smokeless tobacco: Never Used  Substance Use Topics  . Alcohol use: Not on file     Family History  Problem Relation Age of Onset  . Hypertension Mother   . Congestive Heart  Failure Mother   . Heart attack Brother 20     ROS   Review of Systems  Constitutional: Negative for chills and fever.  Eyes: Negative for blurred vision, double vision and photophobia.  Gastrointestinal: Negative for nausea and vomiting.  Musculoskeletal: Positive for back pain, falls, joint pain and myalgias. Negative for neck pain.  Neurological: Positive for weakness (generalized). Negative for dizziness, tingling, tremors, sensory change, speech change, focal weakness, seizures, loss of consciousness and headaches.    Exam   Vitals:   07/03/18 1815 07/04/18 0457  BP: (!) 112/47 (!) 136/54  Pulse: 87 83  Resp: 19 18  Temp:  97.6 F (36.4 C)  SpO2: 100% 100%   General appearance: elderly female, laying flat, NAD Eyes: No scleral injection Cardiovascular: Regular rate and rhythm without murmurs, rubs, gallops. No edema or variciosities. Distal pulses normal. Pulmonary: Effort normal, non-labored breathing Musculoskeletal:     Muscle tone upper extremities: Normal    Muscle tone lower extremities: Normal    Motor exam: Upper Extremities Deltoid Bicep Tricep Grip  Right 5/5 5/5 5/5 5/5  Left 5/5 5/5 5/5 5/5   Lower Extremity IP Quad PF DF EHL  Right 5/5 5/5 5/5 5/5 5/5  Left 5/5 5/5 5/5 5/5 5/5   Neurological Mental Status:    - Patient is awake, alert to self and situation    - Patient is able to give a clear and coherent history.    - No signs of aphasia or neglect Cranial Nerves    - II: Visual Fields are full. PERRL    - III/IV/VI: EOMI without ptosis or diploplia.     - V: Facial sensation is grossly normal    - VII: Facial movement is symmetric.     - VIII: hearing is intact to voice    - X: Uvula elevates symmetrically    - XI: Shoulder shrug is symmetric.    - XII: tongue is midline without atrophy or fasciculations.  Sensory: Sensation grossly intact to LT Deep Tendon Reflexes    - 2+ and symmetric in the biceps and patellae. Plantars   - Toes are  downgoing bilaterally.  Cerebellar    - FNF and HKS are intact bilaterally   Results - Imaging/Labs   Results for orders placed or performed during the hospital encounter of 07/03/18 (from the past 48 hour(s))  CBC     Status: Abnormal   Collection Time: 07/03/18  7:36 AM  Result Value Ref Range   WBC 4.4 4.0 - 10.5 K/uL   RBC 3.93 3.87 - 5.11 MIL/uL   Hemoglobin 10.8 (L) 12.0 - 15.0 g/dL   HCT 37.3  36.0 - 46.0 %   MCV 94.9 80.0 - 100.0 fL   MCH 27.5 26.0 - 34.0 pg   MCHC 29.0 (L) 30.0 - 36.0 g/dL   RDW 16.9 (H) 11.5 - 15.5 %   Platelets 168 150 - 400 K/uL   nRBC 0.0 0.0 - 0.2 %    Comment: Performed at [facility] [address] [place], [place] [address]  Basic metabolic panel     Status: Abnormal   Collection Time: 07/03/18  7:36 AM  Result Value Ref Range   Sodium 140 135 - 145 mmol/L   Potassium 5.1 3.5 - 5.1 mmol/L   Chloride 97 (L) 98 - 111 mmol/L   CO2 24 22 - 32 mmol/L   Glucose, Bld 65 (L) 70 - 99 mg/dL   BUN 52 (H) 8 - 23 mg/dL   Creatinine, Ser 10.04 (H) 0.44 - 1.00 mg/dL   Calcium 9.0 8.9 - 10.3 mg/dL   GFR calc non Af Amer 3 (L) >60 mL/min   GFR calc Af Amer 4 (L) >60 mL/min   Anion gap 19 (H) 5 - 15    Comment: Performed at [facility] [address] [place], [place] [address]  MRSA PCR Screening     Status: None   Collection Time: 07/03/18  1:53 PM  Result Value Ref Range   MRSA by PCR NEGATIVE NEGATIVE    Comment:        The GeneXpert MRSA Assay (FDA approved for NASAL specimens only), is one component of a comprehensive MRSA colonization surveillance program. It is not intended to diagnose MRSA infection nor to guide or monitor treatment for MRSA infections. Performed at [facility], [place] [address], [place], [place] [address]   Blood culture (routine x 2)     Status: None (Preliminary result)   Collection Time: 07/03/18  2:01 PM  Result Value Ref Range   Specimen Description BLOOD BLOOD RIGHT HAND    Special Requests  AEROBIC BOTTLE ONLY Blood Culture adequate volume    Culture      NO GROWTH < 24 HOURS Performed at [facility] [address] [place], [place] [address]    Report Status PENDING   Blood culture (routine x 2)     Status: None (Preliminary result)   Collection Time: 07/03/18  2:01 PM  Result Value Ref Range   Specimen Description BLOOD BLOOD RIGHT HAND    Special Requests      AEROBIC BOTTLE ONLY Blood Culture results may not be optimal due to an inadequate volume of blood received in culture bottles   Culture      NO GROWTH < 24 HOURS Performed at [facility] [address] [place], [place] [address]    Report Status PENDING   Sedimentation rate     Status: Abnormal   Collection Time: 07/03/18  2:01 PM  Result Value Ref Range   Sed Rate 30 (H) 0 - 22 mm/hr    Comment: Performed at [facility], [place] [address] [place] [address], [place], [place] [address]  C-reactive protein     Status: Abnormal   Collection Time: 07/03/18  2:01 PM  Result Value Ref Range   CRP 8.2 (H) <1.0 mg/dL    Comment: Performed at [facility], [place] [address] [place], [place] [address]  Renal function panel     Status: Abnormal   Collection Time: 07/03/18  7:09 PM  Result Value  Ref Range   Sodium 139 135 - 145 mmol/L   Potassium 3.5 3.5 - 5.1 mmol/L   Chloride 99 98 - 111 mmol/L   CO2 29 22 - 32 mmol/L   Glucose, Bld 66 (L) 70 - 99 mg/dL   BUN 19 8 - 23 mg/dL   Creatinine, Ser 5.37 (H) 0.44 - 1.00 mg/dL    Comment: DIALYSIS   Calcium 8.1 (L) 8.9 - 10.3 mg/dL   Phosphorus 4.3 2.5 - 4.6 mg/dL   Albumin 2.1 (L) 3.5 - 5.0 g/dL   GFR calc non Af Amer 7 (L) >60 mL/min   GFR calc Af Amer 8 (L) >60 mL/min   Anion gap 11 5 - 15    Comment: Performed at [facility] [address] [place], [place] [address]  Comprehensive metabolic panel     Status: Abnormal   Collection Time: 07/04/18  3:59 AM  Result Value Ref Range   Sodium 139 135 - 145 mmol/L   Potassium 4.1 3.5 - 5.1 mmol/L    Chloride 97 (L) 98 - 111 mmol/L   CO2 27 22 - 32 mmol/L   Glucose, Bld 55 (L) 70 - 99 mg/dL   BUN 22 8 - 23 mg/dL   Creatinine, Ser 5.64 (H) 0.44 - 1.00 mg/dL   Calcium 8.7 (L) 8.9 - 10.3 mg/dL   Total Protein 6.3 (L) 6.5 - 8.1 g/dL   Albumin 2.3 (L) 3.5 - 5.0 g/dL   AST 11 (L) 15 - 41 U/L   ALT 7 0 - 44 U/L   Alkaline Phosphatase 58 38 - 126 U/L   Total Bilirubin 0.9 0.3 - 1.2 mg/dL   GFR calc non Af Amer 7 (L) >60 mL/min   GFR calc Af Amer 8 (L) >60 mL/min   Anion gap 15 5 - 15    Comment: Performed at [facility], [place] [address], [place], [place] [address]  CBC     Status: Abnormal   Collection Time: 07/04/18  3:59 AM  Result Value Ref Range   WBC 2.6 (L) 4.0 - 10.5 K/uL   RBC 4.32 3.87 - 5.11 MIL/uL   Hemoglobin 11.9 (L) 12.0 - 15.0 g/dL   HCT 41.3 36.0 - 46.0 %   MCV 95.6 80.0 - 100.0 fL   MCH 27.5 26.0 - 34.0 pg   MCHC 28.8 (L) 30.0 - 36.0 g/dL   RDW 16.6 (H) 11.5 - 15.5 %   Platelets 120 (L) 150 - 400 K/uL   nRBC 0.0 0.0 - 0.2 %    Comment: Performed at [facility], [place] [address], [place], [place] [address]  Phosphorus     Status: Abnormal   Collection Time: 07/04/18  3:59 AM  Result Value Ref Range   Phosphorus 5.7 (H) 2.5 - 4.6 mg/dL    Comment: Performed at [facility] [address], [place], [place] [address]    Dg Ribs Unilateral W/chest Right  Result Date: 07/03/2018 CLINICAL DATA:  Pain following fall EXAM: RIGHT RIBS AND CHEST - 3+ VIEW COMPARISON:  June 25, 2018 FINDINGS: Frontal chest as well as oblique and cone-down rib images were obtained. Lungs are clear. Heart is mildly enlarged with pulmonary vascularity normal. There is aortic atherosclerosis. No adenopathy. Surgical clips are noted in the left lateral axillary region. There is no appreciable acute fracture. No pneumothorax or pleural effusion. IMPRESSION: No evident rib fracture. No edema or consolidation. Heart prominent with aortic atherosclerosis noted.  Aortic  Atherosclerosis (ICD10-I70.0). Electronically Signed   By: Lowella Grip III M.D.   On: 07/03/2018 08:29   Dg Thoracic Spine 2 View  Result Date: 07/03/2018 CLINICAL DATA:  Pain following fall EXAM: THORACIC SPINE 3 VIEWS COMPARISON:  Chest radiograph April 21, 2018 FINDINGS: Frontal, lateral, and swimmer's views were obtained. There is mild lower thoracic dextroscoliosis. There is mild anterior wedging of the T11 vertebral body, not appreciable on most recent chest radiograph. No other fracture. No appreciable spondylolisthesis. There is mild disc space narrowing at several levels. There is aortic atherosclerosis. No paraspinous lesions or erosion. IMPRESSION: Age uncertain but suspected recent anterior wedging of the T11 vertebral body. No other evident fracture. No spondylolisthesis. There are areas of osteoarthritic change. There is aortic atherosclerosis. Aortic Atherosclerosis (ICD10-I70.0). Electronically Signed   By: Lowella Grip III M.D.   On: 07/03/2018 08:34   Dg Lumbar Spine Complete  Result Date: 07/03/2018 CLINICAL DATA:  Pain following fall EXAM: LUMBAR SPINE - COMPLETE 4+ VIEW COMPARISON:  Lumbar radiographs March 30, 2017 and lumbar spine CT May 08, 2018 FINDINGS: Frontal, lateral, spot lumbosacral lateral, and bilateral oblique views were obtained. There are 5 non-rib-bearing lumbar type vertebral bodies. T12 rib on the left is hypoplastic. There is no fracture or spondylolisthesis. There is mild disc space narrowing at L2-3. Other disc spaces appear unremarkable. There is facet osteoarthritic change at L4-5 and L5-S1 bilaterally. There is a pelvic calcification, likely a small uterine leiomyoma. There is extensive arterial vascular calcification in the pelvis. There is also aortic atherosclerosis. IMPRESSION: No fracture or spondylolisthesis. Mild disc space narrowing at L2-3. Facet osteoarthritic change at L4-5 and L5-S1 noted bilaterally. Small calcified uterine  leiomyoma in the pelvis. Extensive pelvic vascular calcification as well as aortic atherosclerosis. Aortic Atherosclerosis (ICD10-I70.0). Electronically Signed   By: Lowella Grip III M.D.   On: 07/03/2018 08:31   Ct Thoracic Spine Wo Contrast  Result Date: 07/03/2018 CLINICAL DATA:  Severe back pain from minimal trauma. Patient fell back into wheelchair. EXAM: CT THORACIC SPINE WITHOUT CONTRAST TECHNIQUE: Multidetector CT images of the thoracic were obtained using the standard protocol without intravenous contrast. COMPARISON:  CT scan 05/08/2018 FINDINGS: Alignment: Normal overall alignment. Vertebrae: Destructive bony process at T11-12. The endplates are destroyed and there is a fairly extensive surrounding soft tissue mass or abscess. Findings are most likely due to diskitis and osteomyelitis as there were no significant abnormalities on the recent lumbar spine CT scan from November 2019 to suggest this is a neoplastic process. MRI without and with contrast may be helpful for further evaluation and to exclude the possibility of an epidural abscess. The does appear to be some bulging into the spinal canal and flattening of the thecal sac. The other vertebral bodies are intact. No acute fracture. The facets are normally aligned. No facet fractures. Paraspinal and other soft tissues: Paraspinal mass at T11-12, likely inflammatory phlegmon and and or abscess. Bilateral pleural effusions are noted. Extensive atherosclerotic calcifications involving the thoracic aorta, mitral valve annulus and coronary arteries. Disc levels: T11 findings as detailed above. IMPRESSION: 1. CT findings most suspicious for diskitis and osteomyelitis at T11-12 with destroyed endplates and associated surrounding soft tissue mass/inflammatory phlegmon/abscess. Recommend MR imaging without and with contrast, if possible, for further evaluation and to exclude epidural abscess. 2. Normal alignment of the thoracic vertebral bodies and  no acute fractures. Electronically Signed   By: Marijo Sanes M.D.   On: 07/03/2018 11:30   Mr Thoracic Spine Wo  Contrast  Result Date: 07/04/2018 CLINICAL DATA:  Back pain.  Pathological fracture. EXAM: MRI THORACIC SPINE WITHOUT CONTRAST TECHNIQUE: Multiplanar, multisequence MR imaging of the thoracic spine was performed. No intravenous contrast was administered. COMPARISON:  Thoracic spine CT 07/03/2018 FINDINGS: Alignment:  Negative for listhesis. Vertebrae: T2 hyperintense T12-L1 disc space (discordant numbering compared to prior thoracic spine CT, more reliable on this study due to coverage of the cervical spine) with endplate destruction and marrow edema. Findings are consistent with discitis and osteomyelitis. There is paravertebral inflammation. Disc purulence decompresses into the ventral epidural space with posterior displacement and mild flattening of the cord. Purulence also narrows both T12-L1 foramina. No other level of infection. No acute fracture or evidence of malignancy. Cord:  Normal signal. Paraspinal and other soft tissues: Paravertebral inflammation as noted above. Small layering pleural effusions. Renal atrophy with thick walled cystic intensity on the right. Disc levels: Infectious changes noted above. Otherwise there is usual degenerative changes in the thoracic spine without degenerative impingement. IMPRESSION: T12-L1 discitis osteomyelitis (please note different numbering than on prior thoracic spine CT) with ventral epidural abscess mildly flattening the lower cord. Electronically Signed   By: Monte Fantasia M.D.   On: 07/04/2018 07:34   Impression/Plan   77 y.o. female with acute worsening of chronic low back pain. MRI and CT of her thoracic spine was reviewed. Primary findings are at T12-L1 where this is discitis osteomyelitis with ventral abscess that results in mild cord flattening without significant stenosis. Given the fact that she is neurologically intact on exam, in  combination with minimal spinal stenosis, do not see a need for NS intervention.  - Rec IR consult for possible aspiration.  - ID consult for abx.  - Monitor neuro exam q 4 hours. Report any change Please call for any concerns

## 2018-07-04 NOTE — Progress Notes (Addendum)
   Subjective: No acute events overnight. Complaining of right sided rib pain when she moves. Denies chest pain or palpitations. Also endorsing headache. Very frail appearing.   Objective:  Vital signs in last 24 hours: Vitals:   07/03/18 1730 07/03/18 1800 07/03/18 1815 07/04/18 0457  BP: (!) 111/55 (!) 114/49 (!) 112/47 (!) 136/54  Pulse: 85 87 87 83  Resp: 11 12 19 18   Temp:  (!) 97.5 F (36.4 C) 97.7 F (36.5 C) 97.6 F (36.4 C)  TempSrc:  Oral Oral Oral  SpO2:  100% 100% 100%  Weight:  42.1 kg    Height:       Gen: frail, thin appearing MSK: TTP over right ribs, no swelling/erythema Cardiac: loud systolic murmur heard throughout but loudest at the apex, regular rhythm Neuro: difficult to assess due to poor effort. She is able to move all extremities equally and sensation is symmetric  Micro: 12/27 BC: NG < 24h 12/27 BC NG < 24h  Antibiotics: Vanco (12/28 - present) Cefepime (12/28 - present)  Assessment/Plan:  Active Problems:   Diskitis  72 yoF with ESRD on HD MWF, CVA, HTN, g1dd presenting with right sided trunk/back pain after a fall, found to have osteomylitis/discitis of T12-L1 with ventral epidural abscess  Osteomylitis with ventral epidural abscess: seen today on MRI. Ventral epidural abscess with mild flattening the lower cord. No systemic signs of infection. Neuro exam is normal. Initially, we were trying to hold off on antibiotics until we could get a bone biopsy but given findings of ventral epidural abscess, we will go ahead and start antibiotics.  - Start Vanco and Cefepime. Will need at least 6 weeks of antibiotics  - consult neurosurgery: they will come by and see the patient  - will make NPO in case neurosurg wants to intervene today - continue following blood cultures - pain control: dilaudid 0.5mg  q6h and tylenol   ESRD: Dialyzed yesterday. Seems less confused. Oriented to person/place/month/year. Will go again today. Nephro following. Discussed  the importance of not missing any dialysis sessions while she is receiving antibiotics   Dispo: Anticipated discharge in approximately 2-3 days  Isabelle Course, MD 07/04/2018, 11:22 AM [phone]

## 2018-07-04 NOTE — Procedures (Signed)
Interventional Radiology Procedure:   Indications: T12-L1 discitis  Procedure: CT guided aspiration of paraspinal material along right side of T12-L1.   Findings: Small amount of bloody fluid aspirated.  Complications: None     EBL: None  Plan: Send fluid for culture.    Blondie Riggsbee R. Anselm Pancoast, MD  Pager: [phone]

## 2018-07-04 NOTE — Progress Notes (Addendum)
[facility] Progress Note   Subjective: Seen while being examined by primary. Oriented X 2 today. Still C/O back pain.    Objective Vitals:   07/03/18 1730 07/03/18 1800 07/03/18 1815 07/04/18 0457  BP: (!) 111/55 (!) 114/49 (!) 112/47 (!) 136/54  Pulse: 85 87 87 83  Resp: 11 12 19 18   Temp:  (!) 97.5 F (36.4 C) 97.7 F (36.5 C) 97.6 F (36.4 C)  TempSrc:  Oral Oral Oral  SpO2:  100% 100% 100%  Weight:  42.1 kg    Height:       Physical Exam General: Frail elderly female in NAD Confused Heart: U4,Q0 2/6 systolic M 2/6 diastolic M. 2nd holosystolic M at apex Lungs: CTAB A/P Abdomen: soft, NT, active BS Extremities: No LE edema Dialysis Access: LUA AVF + bruit Thickened scaling, dark areas over AVF arm   Additional Objective Labs: Basic Metabolic Panel: Recent Labs  Lab 07/03/18 0736 07/03/18 1909 07/04/18 0359  NA 140 139 139  K 5.1 3.5 4.1  CL 97* 99 97*  CO2 24 29 27   GLUCOSE 65* 66* 55*  BUN 52* 19 22  CREATININE 10.04* 5.37* 5.64*  CALCIUM 9.0 8.1* 8.7*  PHOS  --  4.3 5.7*   Liver Function Tests: Recent Labs  Lab 07/03/18 1909 07/04/18 0359  AST  --  11*  ALT  --  7  ALKPHOS  --  58  BILITOT  --  0.9  PROT  --  6.3*  ALBUMIN 2.1* 2.3*   No results for input(s): LIPASE, AMYLASE in the last 168 hours. CBC: Recent Labs  Lab 07/03/18 0736 07/04/18 0359  WBC 4.4 2.6*  HGB 10.8* 11.9*  HCT 37.3 41.3  MCV 94.9 95.6  PLT 168 120*   Blood Culture    Component Value Date/Time   SDES BLOOD BLOOD RIGHT HAND 07/03/2018 1401   SDES BLOOD BLOOD RIGHT HAND 07/03/2018 1401   SPECREQUEST AEROBIC BOTTLE ONLY Blood Culture adequate volume 07/03/2018 1401   SPECREQUEST  07/03/2018 1401    AEROBIC BOTTLE ONLY Blood Culture results may not be optimal due to an inadequate volume of blood received in culture bottles   CULT  07/03/2018 1401    NO GROWTH < 24 HOURS Performed at [facility] [address] [place], [place] [address]     CULT  07/03/2018 1401    NO GROWTH < 24 HOURS Performed at [facility] [address] [place], [place] [address]    REPTSTATUS PENDING 07/03/2018 1401   REPTSTATUS PENDING 07/03/2018 1401    Cardiac Enzymes: No results for input(s): CKTOTAL, CKMB, CKMBINDEX, TROPONINI in the last 168 hours. CBG: No results for input(s): GLUCAP in the last 168 hours. Iron Studies: No results for input(s): IRON, TIBC, TRANSFERRIN, FERRITIN in the last 72 hours. @lablastinr3 @ Studies/Results: Dg Ribs Unilateral W/chest Right  Result Date: 07/03/2018 CLINICAL DATA:  Pain following fall EXAM: RIGHT RIBS AND CHEST - 3+ VIEW COMPARISON:  June 25, 2018 FINDINGS: Frontal chest as well as oblique and cone-down rib images were obtained. Lungs are clear. Heart is mildly enlarged with pulmonary vascularity normal. There is aortic atherosclerosis. No adenopathy. Surgical clips are noted in the left lateral axillary region. There is no appreciable acute fracture. No pneumothorax or pleural effusion. IMPRESSION: No evident rib fracture. No edema or consolidation. Heart prominent with aortic atherosclerosis noted. Aortic Atherosclerosis (ICD10-I70.0). Electronically Signed   By: Lowella Grip III M.D.   On: 07/03/2018 08:29  Dg Thoracic Spine 2 View  Result Date: 07/03/2018 CLINICAL DATA:  Pain following fall EXAM: THORACIC SPINE 3 VIEWS COMPARISON:  Chest radiograph April 21, 2018 FINDINGS: Frontal, lateral, and swimmer's views were obtained. There is mild lower thoracic dextroscoliosis. There is mild anterior wedging of the T11 vertebral body, not appreciable on most recent chest radiograph. No other fracture. No appreciable spondylolisthesis. There is mild disc space narrowing at several levels. There is aortic atherosclerosis. No paraspinous lesions or erosion. IMPRESSION: Age uncertain but suspected recent anterior wedging of the T11 vertebral body. No other evident fracture. No spondylolisthesis.  There are areas of osteoarthritic change. There is aortic atherosclerosis. Aortic Atherosclerosis (ICD10-I70.0). Electronically Signed   By: Lowella Grip III M.D.   On: 07/03/2018 08:34   Dg Lumbar Spine Complete  Result Date: 07/03/2018 CLINICAL DATA:  Pain following fall EXAM: LUMBAR SPINE - COMPLETE 4+ VIEW COMPARISON:  Lumbar radiographs March 30, 2017 and lumbar spine CT May 08, 2018 FINDINGS: Frontal, lateral, spot lumbosacral lateral, and bilateral oblique views were obtained. There are 5 non-rib-bearing lumbar type vertebral bodies. T12 rib on the left is hypoplastic. There is no fracture or spondylolisthesis. There is mild disc space narrowing at L2-3. Other disc spaces appear unremarkable. There is facet osteoarthritic change at L4-5 and L5-S1 bilaterally. There is a pelvic calcification, likely a small uterine leiomyoma. There is extensive arterial vascular calcification in the pelvis. There is also aortic atherosclerosis. IMPRESSION: No fracture or spondylolisthesis. Mild disc space narrowing at L2-3. Facet osteoarthritic change at L4-5 and L5-S1 noted bilaterally. Small calcified uterine leiomyoma in the pelvis. Extensive pelvic vascular calcification as well as aortic atherosclerosis. Aortic Atherosclerosis (ICD10-I70.0). Electronically Signed   By: Lowella Grip III M.D.   On: 07/03/2018 08:31   Ct Thoracic Spine Wo Contrast  Result Date: 07/03/2018 CLINICAL DATA:  Severe back pain from minimal trauma. Patient fell back into wheelchair. EXAM: CT THORACIC SPINE WITHOUT CONTRAST TECHNIQUE: Multidetector CT images of the thoracic were obtained using the standard protocol without intravenous contrast. COMPARISON:  CT scan 05/08/2018 FINDINGS: Alignment: Normal overall alignment. Vertebrae: Destructive bony process at T11-12. The endplates are destroyed and there is a fairly extensive surrounding soft tissue mass or abscess. Findings are most likely due to diskitis and  osteomyelitis as there were no significant abnormalities on the recent lumbar spine CT scan from November 2019 to suggest this is a neoplastic process. MRI without and with contrast may be helpful for further evaluation and to exclude the possibility of an epidural abscess. The does appear to be some bulging into the spinal canal and flattening of the thecal sac. The other vertebral bodies are intact. No acute fracture. The facets are normally aligned. No facet fractures. Paraspinal and other soft tissues: Paraspinal mass at T11-12, likely inflammatory phlegmon and and or abscess. Bilateral pleural effusions are noted. Extensive atherosclerotic calcifications involving the thoracic aorta, mitral valve annulus and coronary arteries. Disc levels: T11 findings as detailed above. IMPRESSION: 1. CT findings most suspicious for diskitis and osteomyelitis at T11-12 with destroyed endplates and associated surrounding soft tissue mass/inflammatory phlegmon/abscess. Recommend MR imaging without and with contrast, if possible, for further evaluation and to exclude epidural abscess. 2. Normal alignment of the thoracic vertebral bodies and no acute fractures. Electronically Signed   By: Marijo Sanes M.D.   On: 07/03/2018 11:30   Mr Thoracic Spine Wo Contrast  Result Date: 07/04/2018 CLINICAL DATA:  Back pain.  Pathological fracture. EXAM: MRI THORACIC SPINE WITHOUT CONTRAST TECHNIQUE: Multiplanar,  multisequence MR imaging of the thoracic spine was performed. No intravenous contrast was administered. COMPARISON:  Thoracic spine CT 07/03/2018 FINDINGS: Alignment:  Negative for listhesis. Vertebrae: T2 hyperintense T12-L1 disc space (discordant numbering compared to prior thoracic spine CT, more reliable on this study due to coverage of the cervical spine) with endplate destruction and marrow edema. Findings are consistent with discitis and osteomyelitis. There is paravertebral inflammation. Disc purulence decompresses into  the ventral epidural space with posterior displacement and mild flattening of the cord. Purulence also narrows both T12-L1 foramina. No other level of infection. No acute fracture or evidence of malignancy. Cord:  Normal signal. Paraspinal and other soft tissues: Paravertebral inflammation as noted above. Small layering pleural effusions. Renal atrophy with thick walled cystic intensity on the right. Disc levels: Infectious changes noted above. Otherwise there is usual degenerative changes in the thoracic spine without degenerative impingement. IMPRESSION: T12-L1 discitis osteomyelitis (please note different numbering than on prior thoracic spine CT) with ventral epidural abscess mildly flattening the lower cord. Electronically Signed   By: Monte Fantasia M.D.   On: 07/04/2018 07:34   Medications:  . aspirin EC  81 mg Oral Daily  . [START ON 07/06/2018] calcitRIOL  2 mcg Oral Q M,W,F-HD  . Chlorhexidine Gluconate Cloth  6 each Topical Q0600  . feeding supplement (PRO-STAT SUGAR FREE 64)  30 mL Oral BID  . ferric citrate  420 mg Oral TID WC  . heparin  5,000 Units Subcutaneous Q8H  . latanoprost  1 drop Both Eyes QHS  . multivitamin  1 tablet Oral QHS    Dialysis Orders: [facility] M[facility] 3.5 hrs 180NRe 400/Autoflow 1.5 45.5 kg 3.0 K/ 2.0 Ca  LUA AVF -No heparin -Mircera 225 mcg IV (Last dose 06/02/2018 Last HGB 10.2 06/22/18) -Calcitriol 0.25 mcg PO TIW (Last PTH 136 06/02/18 Last Phos 8.1 06/22/18)    Assessment/Plan: 1.  S/P Mechanical Fall-CT findings suspicious for diskitis and osteomyelitis T11-12 with destroyged endplates and associated soft tissue mass/abcess/inflammatory phlegmon. BC NGTD. Afebrile WBC 2.6 Vanc/Zosyn per primary. To IR today, neurosurgery consult. HD again today,  2.  ESRD -  MWF Last HD 06/26/18. Has not attended one treatment in past week and missed a week earlier in Dec.  S/o most tx. SCr 10 BUN 52.  uremic on adm. HD again today and back on schedule Monday. K+ 4.1 3.   Hypertension/volume  - HD 07/03/18 pre wt 43.9 kg Net UF 1257 Post wt 42.1kg. Attempt 0.5-1 liter today. Lower EDW on DC.  Volume controlled-no antihypertensive meds on OP med list.  4.  Anemia  - HGB 11.9. Follow HGB for now.  5.  Metabolic bone disease -  Ca 8.7 Phos 5.7 . Resume binders, cont VDRA.  6.  Nutrition - Albumin 2.3 Renal diet. Prostat and renal vits. 7.  Chronic noncompliance with HD-addressed issue with pt but she has little insight into importance of dialysis and long term health. Need to consider whether continuing HD is in her best interest. 8. H/O AS/mod MS Last EF 65-70% 07/09/2017 9. H/O HF 10. H/O CVA   Rita H. Brown NP-C 07/04/2018, 9:57 AM  [employer] [phone] I have seen and examined this patient and agree with the plan of care seen, eval examined, discussed with NP .  Jeneen Rinks Cedar Roseman 07/04/2018, 12:09 PM

## 2018-07-04 NOTE — Procedures (Signed)
I was present at this session.  I have reviewed the session itself and made appropriate changes.  HD via PC.  tol well. bp low 100s, for 2.5 L goal.  Flow 400.   Nancy [name] 12/28/20193:16 PM

## 2018-07-05 LAB — RENAL FUNCTION PANEL
Albumin: 2.1 g/dL — ABNORMAL LOW (ref 3.5–5.0)
Anion gap: 15 (ref 5–15)
BUN: 29 mg/dL — ABNORMAL HIGH (ref 8–23)
CO2: 25 mmol/L (ref 22–32)
Calcium: 8.7 mg/dL — ABNORMAL LOW (ref 8.9–10.3)
Chloride: 98 mmol/L (ref 98–111)
Creatinine, Ser: 6.57 mg/dL — ABNORMAL HIGH (ref 0.44–1.00)
GFR calc Af Amer: 7 mL/min — ABNORMAL LOW (ref >60)
GFR calc non Af Amer: 6 mL/min — ABNORMAL LOW (ref >60)
Glucose, Bld: 71 mg/dL (ref 70–99)
Phosphorus: 6.4 mg/dL — ABNORMAL HIGH (ref 2.5–4.6)
Potassium: 3.9 mmol/L (ref 3.5–5.1)
Sodium: 138 mmol/L (ref 135–145)

## 2018-07-05 LAB — CBC
HEMATOCRIT: 35.4 % — AB (ref 36.0–46.0)
Hemoglobin: 10.1 g/dL — ABNORMAL LOW (ref 12.0–15.0)
MCH: 27.2 pg (ref 26.0–34.0)
MCHC: 28.5 g/dL — ABNORMAL LOW (ref 30.0–36.0)
MCV: 95.4 fL (ref 80.0–100.0)
Platelets: 127 10*3/uL — ABNORMAL LOW (ref 150–400)
RBC: 3.71 MIL/uL — ABNORMAL LOW (ref 3.87–5.11)
RDW: 16.2 % — ABNORMAL HIGH (ref 11.5–15.5)
WBC: 4.4 10*3/uL (ref 4.0–10.5)
nRBC: 0 % (ref 0.0–0.2)

## 2018-07-05 MED ORDER — LIDOCAINE-PRILOCAINE 2.5-2.5 % EX CREA: 1.0000 "application " | TOPICAL_CREAM | CUTANEOUS | Status: DC | PRN

## 2018-07-05 MED ORDER — PENTAFLUOROPROP-TETRAFLUOROETH EX AERO: 1.0000 "application " | INHALATION_SPRAY | CUTANEOUS | Status: DC | PRN

## 2018-07-05 MED ORDER — VANCOMYCIN HCL 500 MG IV SOLR
500.0000 mg | Freq: Once | INTRAVENOUS | Status: AC
  Administered 2018-07-05: 500 mg via INTRAVENOUS
  Filled 2018-07-05: qty 500

## 2018-07-05 MED ORDER — SODIUM CHLORIDE 0.9 % IV SOLN: 100.0000 mL | INTRAVENOUS | Status: DC | PRN

## 2018-07-05 MED ORDER — SODIUM CHLORIDE 0.9 % IV SOLN
2.0000 g | Freq: Once | INTRAVENOUS | Status: AC
  Administered 2018-07-05: 2 g via INTRAVENOUS
  Filled 2018-07-05: qty 2

## 2018-07-05 MED ORDER — LIDOCAINE HCL (PF) 1 % IJ SOLN: 5.0000 mL | INTRAMUSCULAR | Status: DC | PRN

## 2018-07-05 NOTE — Progress Notes (Signed)
Pharmacy Antibiotic Note  [place] B Boylan is a 77 y.o. female admitted on 07/03/2018 with back pain.  Pharmacy has been consulted for Cefepime and vancomycin dosing for Osteomyelitis. ESRD - HD MWF  *Patient received HD off schedule this early AM after loading doses given last PM.  Cultures pending.   Plan: Cefepime 2g IV x1 now (d/t HD this am off schedule) Vancomycin 500mg  IV x1 now (d/t HD this am off schedule) Then continue Cefepime 2g IV post-HD MWF Vancomycin 500 mg IV qHD-MWF Monitor clinical status, renal function and culture results daily.  vanc level pre HD as needed per protocol  Height: 5\' 3"  (160 cm) Weight: 94 lb 12.8 oz (43 kg) IBW/kg (Calculated) : 52.4  Temp (24hrs), Avg:97.7 F (36.5 C), Min:97.5 F (36.4 C), Max:97.9 F (36.6 C)  Recent Labs  Lab 07/03/18 0736 07/03/18 1909 07/04/18 0359 07/05/18 0306  WBC 4.4  --  2.6* 4.4  CREATININE 10.04* 5.37* 5.64* 6.57*    Estimated Creatinine Clearance: 5 mL/min (A) (by C-G formula based on SCr of 6.57 mg/dL (H)).    Allergies  Allergen Reactions  . Hydrocodone Nausea And Vomiting and Other (See Comments)    Dizziness, also  . Tape Other (See Comments)    TAPE LEAVES DARK PATCHES ON THE SKIN!!    Thank you for allowing pharmacy to be a part of this patient's care.  Sloan Leiter, PharmD, BCPS, BCCCP Clinical Pharmacist Please refer to [facility] for [place] numbers 07/05/2018 1:46 PM

## 2018-07-05 NOTE — Progress Notes (Addendum)
[facility] Progress Note   Subjective: HD last PM off schedule for uremia/missed HD. Screamed entire treatment. Continue yelling out for staff. Says she is at daughter's house, that it's Friday. Oriented to self only.   Objective Vitals:   07/05/18 0600 07/05/18 0625 07/05/18 0648 07/05/18 0857  BP: (!) 92/45 (!) 101/39 (!) 96/44 (!) 102/46  Pulse: 96 92 96   Resp: 19 (!) 22 (!) 21   Temp:   97.7 F (36.5 C)   TempSrc:   Oral   SpO2: 100% 100% 94%   Weight:   43 kg   Height:       General: Frail elderly female in NAD. Oriented to self only Heart: U2,P5 2/6 systolic M 2/6 diastolic M. 2nd holosystolic M at apex Lungs: CTAB A/P Abdomen: soft, NT, active BS Extremities: No LE edema Dialysis Access: LUA AVF + bruit. Thickened scaling, dark areas over AVF arm.   Dialysis Orders:  Additional Objective Labs: Basic Metabolic Panel: Recent Labs  Lab 07/03/18 1909 07/04/18 0359 07/05/18 0306  NA 139 139 138  K 3.5 4.1 3.9  CL 99 97* 98  CO2 29 27 25   GLUCOSE 66* 55* 71  BUN 19 22 29*  CREATININE 5.37* 5.64* 6.57*  CALCIUM 8.1* 8.7* 8.7*  PHOS 4.3 5.7* 6.4*   Liver Function Tests: Recent Labs  Lab 07/03/18 1909 07/04/18 0359 07/05/18 0306  AST  --  11*  --   ALT  --  7  --   ALKPHOS  --  58  --   BILITOT  --  0.9  --   PROT  --  6.3*  --   ALBUMIN 2.1* 2.3* 2.1*   No results for input(s): LIPASE, AMYLASE in the last 168 hours. CBC: Recent Labs  Lab 07/03/18 0736 07/04/18 0359 07/05/18 0306  WBC 4.4 2.6* 4.4  HGB 10.8* 11.9* 10.1*  HCT 37.3 41.3 35.4*  MCV 94.9 95.6 95.4  PLT 168 120* 127*   Blood Culture    Component Value Date/Time   SDES ABSCESS T12 07/04/2018 1626   SPECREQUEST NONE 07/04/2018 1626   CULT PENDING 07/04/2018 1626   REPTSTATUS PENDING 07/04/2018 1626    Cardiac Enzymes: No results for input(s): CKTOTAL, CKMB, CKMBINDEX, TROPONINI in the last 168 hours. CBG: No results for input(s): GLUCAP in the last 168  hours. Iron Studies: No results for input(s): IRON, TIBC, TRANSFERRIN, FERRITIN in the last 72 hours. @lablastinr3 @ Studies/Results: Ct Thoracic Spine Wo Contrast  Result Date: 07/03/2018 CLINICAL DATA:  Severe back pain from minimal trauma. Patient fell back into wheelchair. EXAM: CT THORACIC SPINE WITHOUT CONTRAST TECHNIQUE: Multidetector CT images of the thoracic were obtained using the standard protocol without intravenous contrast. COMPARISON:  CT scan 05/08/2018 FINDINGS: Alignment: Normal overall alignment. Vertebrae: Destructive bony process at T11-12. The endplates are destroyed and there is a fairly extensive surrounding soft tissue mass or abscess. Findings are most likely due to diskitis and osteomyelitis as there were no significant abnormalities on the recent lumbar spine CT scan from November 2019 to suggest this is a neoplastic process. MRI without and with contrast may be helpful for further evaluation and to exclude the possibility of an epidural abscess. The does appear to be some bulging into the spinal canal and flattening of the thecal sac. The other vertebral bodies are intact. No acute fracture. The facets are normally aligned. No facet fractures. Paraspinal and other soft tissues: Paraspinal mass at T11-12, likely inflammatory phlegmon and and or abscess.  Bilateral pleural effusions are noted. Extensive atherosclerotic calcifications involving the thoracic aorta, mitral valve annulus and coronary arteries. Disc levels: T11 findings as detailed above. IMPRESSION: 1. CT findings most suspicious for diskitis and osteomyelitis at T11-12 with destroyed endplates and associated surrounding soft tissue mass/inflammatory phlegmon/abscess. Recommend MR imaging without and with contrast, if possible, for further evaluation and to exclude epidural abscess. 2. Normal alignment of the thoracic vertebral bodies and no acute fractures. Electronically Signed   By: Marijo Sanes M.D.   On: 07/03/2018  11:30   Mr Thoracic Spine Wo Contrast  Result Date: 07/04/2018 CLINICAL DATA:  Back pain.  Pathological fracture. EXAM: MRI THORACIC SPINE WITHOUT CONTRAST TECHNIQUE: Multiplanar, multisequence MR imaging of the thoracic spine was performed. No intravenous contrast was administered. COMPARISON:  Thoracic spine CT 07/03/2018 FINDINGS: Alignment:  Negative for listhesis. Vertebrae: T2 hyperintense T12-L1 disc space (discordant numbering compared to prior thoracic spine CT, more reliable on this study due to coverage of the cervical spine) with endplate destruction and marrow edema. Findings are consistent with discitis and osteomyelitis. There is paravertebral inflammation. Disc purulence decompresses into the ventral epidural space with posterior displacement and mild flattening of the cord. Purulence also narrows both T12-L1 foramina. No other level of infection. No acute fracture or evidence of malignancy. Cord:  Normal signal. Paraspinal and other soft tissues: Paravertebral inflammation as noted above. Small layering pleural effusions. Renal atrophy with thick walled cystic intensity on the right. Disc levels: Infectious changes noted above. Otherwise there is usual degenerative changes in the thoracic spine without degenerative impingement. IMPRESSION: T12-L1 discitis osteomyelitis (please note different numbering than on prior thoracic spine CT) with ventral epidural abscess mildly flattening the lower cord. Electronically Signed   By: Monte Fantasia M.D.   On: 07/04/2018 07:34   Ct Aspiration  Result Date: 07/04/2018 INDICATION: 77 year old with discitis at T12-L1. Request for image guided aspiration. EXAM: CT-GUIDED ASPIRATION OF PARASPINAL TISSUE AT THORACOLUMBAR REGION MEDICATIONS: No medications given for this procedure ANESTHESIA/SEDATION: None COMPLICATIONS: None immediate. PROCEDURE: Informed written consent was obtained from the patient after a thorough discussion of the procedural risks,  benefits and alternatives. All questions were addressed. A timeout was performed prior to the initiation of the procedure. Patient was placed prone. CT images through the thoracolumbar region were obtained. The skin was prepped with chlorhexidine and sterile field was created. The skin was anesthetized with 1% lidocaine. 18 gauge trocar needle was directed into the right paraspinal tissue at the level of T12-L1 with CT guidance. Small amount of thick bloody material was aspirated as the needle was removed. A new 18 gauge trocar needle was directed back into the right paraspinal tissue and a few additional drops of bloody fluid were obtained. The second aspirate was pulled into a syringe using sterile saline. Fluid was sent for culture. Bandage placed over the puncture site. FINDINGS: Abnormal paraspinal tissue at the level of T12-L1. Needle was directed into the abnormal paraspinal tissue along the right side. Small amount of bloody material was aspirated as the needles were withdrawn. IMPRESSION: CT-guided aspiration of bloody material from the T12-L1 right paraspinal region. Electronically Signed   By: Markus Daft M.D.   On: 07/04/2018 16:05   Medications: . sodium chloride    . sodium chloride    . [START ON 07/06/2018] ceFEPime (MAXIPIME) IV     . aspirin EC  81 mg Oral Daily  . [START ON 07/06/2018] calcitRIOL  2 mcg Oral Q M,W,F-HD  . Chlorhexidine Gluconate Cloth  6 each Topical Q0600  . feeding supplement (PRO-STAT SUGAR FREE 64)  30 mL Oral BID  . ferric citrate  420 mg Oral TID WC  . heparin  5,000 Units Subcutaneous Q8H  . latanoprost  1 drop Both Eyes QHS  . multivitamin  1 tablet Oral QHS  . polyethylene glycol  17 g Oral Daily  . senna-docusate  1 tablet Oral BID  . [START ON 07/06/2018] vancomycin  500 mg Intravenous Q M,W,F-HD     Dialysis Orders:[facility] MWF 3.5 hrs 180NRe 400/Autoflow 1.5 45.5 kg 3.0 K/ 2.0 Ca  LUA AVF -No heparin -Mircera 225 mcg IV (Last dose 06/02/2018 Last  HGB 10.2 06/22/18) -Calcitriol 0.25 mcg PO TIW (Last PTH 136 06/02/18 Last Phos 8.1 06/22/18)   Assessment/Plan: 1. S/P Mechanical Fall-Osteomylitis with ventral epidural abscess. Will need ABX for at least 6 weeks. WBC 4.4. On vanc/cefepime. BC NG 24 hrs. Afebrile. Per primary-IR/neurosurgery consulted.  2. ESRD - MWF-Has had HD 12/27, 12/28 for uremia after missing HD for most of month. Next HD tomorrow on schedule. K+ 3.9 Use 3.0 K bath.  3. Hypertension/volume - HD 07/04/18 pre wt 43 kg Net UF 83cc Post wt 43 kg.Lower EDW on DC.  Volume controlled-no antihypertensive meds on OP med list.Minimal UF with HD tomorrow.  4. Anemia - HGB 10.1 Give Aranesp 40 mcg IV with HD tomorrow. .  5. Metabolic bone disease - Ca 8.7 Phos 6.4 . Resume binders, cont VDRA. 6. Nutrition - Albumin 2.1 Renal diet. Prostat and renal vits. 7. Chronic noncompliance with HD-addressed issue with pt but she has little insight into importance of dialysis and long term health. Need to consider whether continuing HD is in her best interest. 8. H/O AS/mod MS Last EF 65-70% 07/09/2017 9. H/O HF 10. H/O CVA  Rita H. Brown NP-C 07/05/2018, 9:41 AM  [facility] [phone]  Pt seen and examined; agree with NP A&P. Next HD tomorrow to resume MWF schedule but will keep in mind that the outpt schedule is Sun, Tues and Fri this holiday week. Emaciated and Albumin of 2.1 is consistent. Agree that she is not the best dialysis candidate with malnutrition, poor insight and [employer].

## 2018-07-05 NOTE — Progress Notes (Signed)
HD tx initiated via 15Gx2 w/o problem Pull/push/flush well w/o problem VSS Will continue to monitor while on HD tx 

## 2018-07-05 NOTE — Progress Notes (Signed)
   Subjective: IR biopsy yesterday without complication. Upon entering the room, Nancy [name] is hollering that her left arm is bleeding. She points to the site of bleeding but no blood or skin changes are noted. She thinks she is in [employer] and is not oriented to situation. She is oriented to self, year, month. She thinks she is dying and endorsing pain in her back down to her ankles and right sided trunk pain.   Objective:  Vital signs in last 24 hours: Vitals:   07/05/18 0530 07/05/18 0600 07/05/18 0625 07/05/18 0648  BP: (!) 90/41 (!) 92/45 (!) 101/39 (!) 96/44  Pulse: 93 96 92 96  Resp: 17 19 (!) 22 (!) 21  Temp:    97.7 F (36.5 C)  TempSrc:    Oral  SpO2: 100% 100% 100% 94%  Weight:    43 kg  Height:       Gen: frail, elderly, thin appearing.  Neuro: Oriented to person, year, month. Not oriented to situation or place. Strength exam limited by patient participation. 5/5 dorsiflexion and plantar flexion bilaterally. Reporting decreased sensation on left leg. Sensation is symmetric in the upper extremities. Refuses to raise legs and with passive leg raise, she reports pain from back down to ankles.  MSK: tender to palpation over right lateral trunk without overlying skin changes. Heating pad in place under back.   Micro: 12/27 BC: NG < 24h 12/27 BC NG < 24h 12/28: Wound culture - gram stain = no WBC, no organism   Antibiotics: Vanco (12/28 - present) Cefepime (12/28 - present)  Assessment/Plan:  Principal Problem:   Acute osteomyelitis of thoracic spine (HCC) Active Problems:   ESRD (end stage renal disease) (HCC)   Epidural abscess  18 yoF with ESRD on HD MWF, CVA, HTN, g1dd presenting with right sided trunk/back pain after a fall, found to have osteomylitis/discitis of T12-L1 with ventral epidural abscess  Osteomylitis with ventral epidural abscess: Appreciate IR's assistance yesterday with biopsy. A small amount of bloody fluid was aspirated from paraspinal material  along right side of T12-L1. Gram stain is without WBCs or organism. Ms. Bucaro continues to endorse pain in right trunk and low back. She is due for another dose of dilaudid. Neuro exam is difficult due to patient participation but lower extremity strength is symmetric. Systolic BP is dropping, but she remains afebrile without leukocytosis.  - Neurosurg consulted: surgical debridement is high risk, recommend abx and serial neuro exams with periodic radiographic observation  - Continue Vanco and Cefepime MWF with HD. Will need at least 6 weeks of antibiotics  - continue following blood and wound cultures - pain control: dilaudid 0.5mg  q6h and tylenol   ESRD: Appreciate nephrology's assistance. Unable to complete HD overnight due to hypotension. Last full HD was 12/27. Creatine 6.57 and BUN 29 this morning. Poor prognosis of current infection if she is unable to tolerate HD.  - continue to monitor hypotension   Dispo: Anticipated discharge in several days  Isabelle Course, MD 07/05/2018, 8:01 AM [phone]

## 2018-07-05 NOTE — Progress Notes (Signed)
HD tx completed @ [phone] w/bp issues throughout tx, pt hollered entire tx UF goal not met Blood rinsed back VSS Report called to Fredrich Romans, RN

## 2018-07-05 NOTE — Progress Notes (Addendum)
Internal Medicine Teaching Service Attending:   I saw and examined the patient. I reviewed the resident's note and I agree with the resident's findings and plan as documented in the resident's note.  Principal Problem:   Acute osteomyelitis of thoracic spine (HCC) Active Problems:   ESRD (end stage renal disease) ([place])   Epidural abscess  Hospital day #83 for a 77 year old woman living with end-stage renal disease on hemodialysis through a left upper extremity graft admitted with acute osteomyelitis of P53-I1 complicated by a ventral epidural abscess.  On imaging she does have some compression of her conus, but her neurologic exam is reassuring with no weakness or sensory changes of the lower extremities.  Neurosurgery consulted yesterday and recommended nonoperative medical management for now unless her clinical status changes, as well as serial neuro imaging over the coming days.  The cause of the infection is unclear, blood cultures have been no growth, most likely related to transient bacteremia during HD.  We did get an aspiration of the involved disc, however because of the significant epidural abscess we felt like we could not wait on empiric antibiotics, and vancomycin and cefepime was given about 4 hours prior to that biopsy. Culture from that aspiration so far growing rare gram-negative rods, we will see if this can be speciated.  Most likely we will have to embark on a prolonged 6-week course of broad-spectrum antibiotics dosed with hemodialysis.  We updated the patient's granddaughter at the bedside today.  Long-term Ms. Kelemen has struggled to stabilize since starting hemodialysis about 2 years ago and her prognosis remains poor.  She appears malnourished with significant weight loss, she has a low functional state, she has waxing and waning confusion/delirium, she has interpersonal conflicts with her hemodialysis center which causes her to commonly miss HD sessions.  I warned the family that  this is a significant setback, though it seems pretty clear to me from the patient and the family that they desire to prolong the patient's life for as long as possible.    Dr. Lynnae January will take over as attending physician tomorrow.  Lalla Brothers, MD FACP

## 2018-07-06 ENCOUNTER — Encounter (HOSPITAL_COMMUNITY): Payer: Self-pay | Admitting: *Deleted

## 2018-07-06 ENCOUNTER — Other Ambulatory Visit: Payer: Self-pay

## 2018-07-06 DIAGNOSIS — B965 Pseudomonas (aeruginosa) (mallei) (pseudomallei) as the cause of diseases classified elsewhere: Secondary | ICD-10-CM

## 2018-07-06 LAB — BASIC METABOLIC PANEL
ANION GAP: 13 (ref 5–15)
BUN: 9 mg/dL (ref 8–23)
CO2: 25 mmol/L (ref 22–32)
Calcium: 8.7 mg/dL — ABNORMAL LOW (ref 8.9–10.3)
Chloride: 101 mmol/L (ref 98–111)
Creatinine, Ser: 3.6 mg/dL — ABNORMAL HIGH (ref 0.44–1.00)
GFR calc Af Amer: 14 mL/min — ABNORMAL LOW (ref >60)
GFR, EST NON AFRICAN AMERICAN: 12 mL/min — AB (ref >60)
Glucose, Bld: 65 mg/dL — ABNORMAL LOW (ref 70–99)
Potassium: 3.6 mmol/L (ref 3.5–5.1)
Sodium: 139 mmol/L (ref 135–145)

## 2018-07-06 LAB — CBC
HCT: 35.6 % — ABNORMAL LOW (ref 36.0–46.0)
Hemoglobin: 10.5 g/dL — ABNORMAL LOW (ref 12.0–15.0)
MCH: 28.5 pg (ref 26.0–34.0)
MCHC: 29.5 g/dL — ABNORMAL LOW (ref 30.0–36.0)
MCV: 96.7 fL (ref 80.0–100.0)
NRBC: 0 % (ref 0.0–0.2)
Platelets: 116 10*3/uL — ABNORMAL LOW (ref 150–400)
RBC: 3.68 MIL/uL — AB (ref 3.87–5.11)
RDW: 16.4 % — ABNORMAL HIGH (ref 11.5–15.5)
WBC: 4.4 10*3/uL (ref 4.0–10.5)

## 2018-07-06 MED ORDER — DARBEPOETIN ALFA 60 MCG/0.3ML IJ SOSY
PREFILLED_SYRINGE | INTRAMUSCULAR | Status: AC
  Administered 2018-07-06: 60 ug via INTRAVENOUS
  Filled 2018-07-06: qty 0.3

## 2018-07-06 MED ORDER — NEPRO/CARBSTEADY PO LIQD
237.0000 mL | Freq: Two times a day (BID) | ORAL | Status: DC
  Administered 2018-07-07 – 2018-07-20 (×4): 237 mL via ORAL
  Filled 2018-07-06 (×48): qty 237

## 2018-07-06 MED ORDER — PROMETHAZINE HCL 25 MG PO TABS
ORAL_TABLET | ORAL | Status: AC
  Administered 2018-07-06: 12.5 mg via ORAL
  Filled 2018-07-06: qty 1

## 2018-07-06 MED ORDER — DARBEPOETIN ALFA 60 MCG/0.3ML IJ SOSY
60.0000 ug | PREFILLED_SYRINGE | INTRAMUSCULAR | Status: DC
  Administered 2018-07-06 – 2018-07-13 (×2): 60 ug via INTRAVENOUS
  Filled 2018-07-06 (×2): qty 0.3

## 2018-07-06 MED ORDER — CALCITRIOL 0.5 MCG PO CAPS
ORAL_CAPSULE | ORAL | Status: AC
  Administered 2018-07-06: 2 ug via ORAL
  Filled 2018-07-06: qty 4

## 2018-07-06 MED ORDER — HYDROMORPHONE HCL 1 MG/ML IJ SOLN
INTRAMUSCULAR | Status: AC
  Administered 2018-07-06: 0.5 mg via INTRAVENOUS
  Filled 2018-07-06: qty 0.5

## 2018-07-06 NOTE — Progress Notes (Signed)
PT Cancellation Note  Patient Details Name: Nancy [name] MRN: [id number] DOB: June 24, 1941   Cancelled Treatment:    Reason Eval/Treat Not Completed: Max encouragement provided for pt to participate with PT treatment session. Pt initially reporting she won't participate until her daughter brings her clothes. Educated pt that MD ordered PT in bed until bedrest orders lifted. Pt then states she will not participate until she gets a bath. Encouraged pt to at least allow PT to help her reposition, as she has scooted down in bed and feet were resting on footboard of bed. Pt educated on risk for pressure injuries on bottoms of feet but pt continued to refuse. At end of pt encounter she became agitated and PT exited room. Will continue to follow.   Thelma Comp 07/06/2018, 9:52 AM   Rolinda Roan, PT, DPT Acute Rehabilitation Services Pager: [phone] Office: [phone]

## 2018-07-06 NOTE — Progress Notes (Addendum)
[facility] Progress Note   Dialysis Orders: [place] MWF 3.5 hrs 180NRe 400/Autoflow 1.5 45.5 kg 3.0 K/ 2.0 Ca  LUA AVF -No heparin -Mircera 225 mcg IV (Last dose 06/02/2018 Last HGB 10.2 06/22/18) -Calcitriol 0.25 mcg PO TIW (Last PTH 136 06/02/18 Last Phos 8.1 06/22/18)   Assessment/Plan: 1. s/p mech fall/obsteo with T12-L1 osteo with ventral epidural abscess- medical management per NS with 6 wk abtx - will be a challenge implementing this given outpatient noncompliance- prelim culture - rare pseudomonas- presently on Vanc and Maxipime 2. ESRD - MWF - HD today K 3.6 using 4 K bath today 3. Anemia - hgb 10.5 - stable - last ESA dose 225 11/26 - at some point hgb will drop -resume lower dose Aranesp at 60 4. Secondary hyperparathyroidism - P trending up- not checked today - as Rx aurixya - no change in dose for now 5. HTN/volume - BP/volume ok  6. Malnutrition - continues to lose weight, alb low- has prostat - will liberalize diet to regular with fluid restriction and add nepro 7. Dementia - patient has no insight; 8. Goals of care - family still wishes full court press - she will not do well; QOL is poor and prognosis is the same 9. Hx CVA/AS/mod MS- EF ok 10. Thrombocytopenia - not clear why plts trending down -she is on no heparin HD- watch as she is getting SQ heparin here   Myriam Jacobson, PA-C [facility] 07/06/2018,10:27 AM  LOS: 3 days   I have seen and examined this patient and agree with the plan of care. Refusing to go to HD until she's bathed; nurse aware. Pt actually refused to go to HD bec she wanted to eat and then later wanted to be bathed before HD. Unfortunately she will now have to go to 3rd shift or possibly evening.  Dwana Melena, MD 07/06/2018, 1:01 PM  Subjective:   C/o back pain;l refused PT until family brings clothes - threw her underwear on the floor  Objective Vitals:   07/05/18 0648 07/05/18 0857  07/05/18 1758 07/06/18 0904  BP: (!) 96/44 (!) 102/46 115/62 (!) 116/56  Pulse: 96  89 85  Resp: (!) 21  (!) 23 18  Temp: 97.7 F (36.5 C)   98.1 F (36.7 C)  TempSrc: Oral   Oral  SpO2: 94%  95% 100%  Weight: 43 kg     Height:       Physical Exam General: talking on the phone NAD Heart: RRR 2/6 murmur Lungs: grossly clear Abdomen: soft NT Extremities: no LE edema Dialysis Access:  Left upper AVF + bruit Skin: lax - obvious large weight loss over time   Additional Objective Labs: Basic Metabolic Panel: Recent Labs  Lab 07/03/18 1909 07/04/18 0359 07/05/18 0306 07/06/18 0612  NA 139 139 138 139  K 3.5 4.1 3.9 3.6  CL 99 97* 98 101  CO2 29 27 25 25   GLUCOSE 66* 55* 71 65*  BUN 19 22 29* 9  CREATININE 5.37* 5.64* 6.57* 3.60*  CALCIUM 8.1* 8.7* 8.7* 8.7*  PHOS 4.3 5.7* 6.4*  --    Liver Function Tests: Recent Labs  Lab 07/03/18 1909 07/04/18 0359 07/05/18 0306  AST  --  11*  --   ALT  --  7  --   ALKPHOS  --  58  --   BILITOT  --  0.9  --   PROT  --  6.3*  --  ALBUMIN 2.1* 2.3* 2.1*   No results for input(s): LIPASE, AMYLASE in the last 168 hours. CBC: Recent Labs  Lab 07/03/18 0736 07/04/18 0359 07/05/18 0306 07/06/18 0612  WBC 4.4 2.6* 4.4 4.4  HGB 10.8* 11.9* 10.1* 10.5*  HCT 37.3 41.3 35.4* 35.6*  MCV 94.9 95.6 95.4 96.7  PLT 168 120* 127* 116*   Blood Culture    Component Value Date/Time   SDES ABSCESS T12 07/04/2018 1626   SPECREQUEST NONE 07/04/2018 1626   CULT RARE PSEUDOMONAS AERUGINOSA 07/04/2018 1626   REPTSTATUS PENDING 07/04/2018 1626    Cardiac Enzymes: No results for input(s): CKTOTAL, CKMB, CKMBINDEX, TROPONINI in the last 168 hours. CBG: No results for input(s): GLUCAP in the last 168 hours. Iron Studies: No results for input(s): IRON, TIBC, TRANSFERRIN, FERRITIN in the last 72 hours. Lab Results  Component Value Date   INR 1.13 03/29/2018   INR 1.03 03/06/2018   INR 1.30 02/17/2017   Studies/Results: Ct  Aspiration  Result Date: 07/04/2018 INDICATION: 77 year old with discitis at T12-L1. Request for image guided aspiration. EXAM: CT-GUIDED ASPIRATION OF PARASPINAL TISSUE AT THORACOLUMBAR REGION MEDICATIONS: No medications given for this procedure ANESTHESIA/SEDATION: None COMPLICATIONS: None immediate. PROCEDURE: Informed written consent was obtained from the patient after a thorough discussion of the procedural risks, benefits and alternatives. All questions were addressed. A timeout was performed prior to the initiation of the procedure. Patient was placed prone. CT images through the thoracolumbar region were obtained. The skin was prepped with chlorhexidine and sterile field was created. The skin was anesthetized with 1% lidocaine. 18 gauge trocar needle was directed into the right paraspinal tissue at the level of T12-L1 with CT guidance. Small amount of thick bloody material was aspirated as the needle was removed. A new 18 gauge trocar needle was directed back into the right paraspinal tissue and a few additional drops of bloody fluid were obtained. The second aspirate was pulled into a syringe using sterile saline. Fluid was sent for culture. Bandage placed over the puncture site. FINDINGS: Abnormal paraspinal tissue at the level of T12-L1. Needle was directed into the abnormal paraspinal tissue along the right side. Small amount of bloody material was aspirated as the needles were withdrawn. IMPRESSION: CT-guided aspiration of bloody material from the T12-L1 right paraspinal region. Electronically Signed   By: Markus Daft M.D.   On: 07/04/2018 16:05   Medications: . sodium chloride    . sodium chloride    . ceFEPime (MAXIPIME) IV     . aspirin EC  81 mg Oral Daily  . calcitRIOL  2 mcg Oral Q M,W,F-HD  . Chlorhexidine Gluconate Cloth  6 each Topical Q0600  . feeding supplement (PRO-STAT SUGAR FREE 64)  30 mL Oral BID  . ferric citrate  420 mg Oral TID WC  . heparin  5,000 Units Subcutaneous Q8H   . latanoprost  1 drop Both Eyes QHS  . multivitamin  1 tablet Oral QHS  . polyethylene glycol  17 g Oral Daily  . senna-docusate  1 tablet Oral BID

## 2018-07-06 NOTE — Progress Notes (Signed)
  Date: 07/06/2018  Patient name: Nancy [name]  Medical record number: [id number]  Date of birth: Dec 07, 1940   I have seen and evaluated this patient and I have discussed the plan of care with the house staff. Please see their note for complete details. I concur with their findings with the following additions/corrections: [name] was seen this morning on team rounds.  We will stop vancomycin as her culture has only been positive for Pseudomonas and she was MRSA negative on admission.  Otherwise, per Dr. Cristal Generous note.  Bartholomew Crews, MD 07/06/2018, 1:59 PM

## 2018-07-06 NOTE — Progress Notes (Signed)
   Subjective:   Patient continues to have uncontrolled back pain. She is itchy, cold, and having a lot of pain. She would like "after glow" for her bottom. She is wondering where her family is and would like [place] to call them. She knows that she is at [facility] and that it is December. She does not know the year. She is labile with her mood. Frustrated that we keep asking her the same questions daily. She would like more clothes prior to going to HD today. Discussed the plan to continue ABx and coordinate her care with her family. She voices understanding.   Objective: Vital signs in last 24 hours: Vitals:   07/05/18 0648 07/05/18 0857 07/05/18 1758 07/06/18 0904  BP: (!) 96/44 (!) 102/46 115/62 (!) 116/56  Pulse: 96  89 85  Resp: (!) 21  (!) 23 18  Temp: 97.7 F (36.5 C)   98.1 F (36.7 C)  TempSrc: Oral   Oral  SpO2: 94%  95% 100%  Weight: 43 kg     Height:       Gen: laying in bed, NAD, alert and oriented to self, place, and month Neuro: Good cooperation with exam. Lower extremity strength and sensation is normal and symmetric  MSK: no evidence of hematoma or skin changes over the low back   Assessment/Plan:  Principal Problem:   Acute osteomyelitis of thoracic spine (HCC) Active Problems:   ESRD (end stage renal disease) (HCC)   Epidural abscess  69 yoF with ESRD on HD MWF, CVA, HTN, g1dd presenting with right sided trunk/back pain after a fall, found to have osteomylitis/discitis of T12-L1 with ventral epidural abscess  1. Osteomyelitis with ventral epidural abscess: Stable. Continues to complain of back pain, has dilaudid prn q6h which she is not using as frequently as she could be. Also is not using prn tylenol. Neuro exam remains normal and unchanged. Neurosurgery recommended serial imaging but did not clarify the frequency. Will touch base with them as needed. Culture from the abscess is growing rare pseudomonas, susceptibilities to follow. Can discontinue vancomycin.  Continue cefepime.  - f/u pseudomonas susceptibilities --> final results show pansensitive  - d/c vanco. Continue cefepime   2. ESRD: stable. BUN and creatine improved today. Planning for HD today    Dispo: Anticipated discharge in several day. PT recommending SNF but will need to discuss with family what they would like to do.    Isabelle Course, MD 07/06/2018, 10:27 AM Pager: [phone]

## 2018-07-07 DIAGNOSIS — I150 Renovascular hypertension: Secondary | ICD-10-CM | POA: Diagnosis not present

## 2018-07-07 DIAGNOSIS — N186 End stage renal disease: Secondary | ICD-10-CM | POA: Diagnosis not present

## 2018-07-07 DIAGNOSIS — Z992 Dependence on renal dialysis: Secondary | ICD-10-CM | POA: Diagnosis not present

## 2018-07-07 LAB — BASIC METABOLIC PANEL
Anion gap: 14 (ref 5–15)
BUN: 7 mg/dL — ABNORMAL LOW (ref 8–23)
CALCIUM: 8.8 mg/dL — AB (ref 8.9–10.3)
CO2: 24 mmol/L (ref 22–32)
Chloride: 100 mmol/L (ref 98–111)
Creatinine, Ser: 2.59 mg/dL — ABNORMAL HIGH (ref 0.44–1.00)
GFR calc Af Amer: 20 mL/min — ABNORMAL LOW (ref >60)
GFR calc non Af Amer: 17 mL/min — ABNORMAL LOW (ref >60)
Glucose, Bld: 81 mg/dL (ref 70–99)
Potassium: 3.7 mmol/L (ref 3.5–5.1)
Sodium: 138 mmol/L (ref 135–145)

## 2018-07-07 MED ORDER — HYDROMORPHONE HCL 1 MG/ML IJ SOLN
0.5000 mg | Freq: Four times a day (QID) | INTRAMUSCULAR | Status: DC | PRN
  Administered 2018-07-07 – 2018-07-08 (×4): 0.5 mg via INTRAMUSCULAR
  Filled 2018-07-07 (×3): qty 1

## 2018-07-07 NOTE — Evaluation (Addendum)
Occupational Therapy Evaluation Patient Details Name: Nancy [name] MRN: [id number] DOB: 15-Sep-1940 Today's Date: 07/07/2018    History of Present Illness 77 yo female with a medical history of ESRD on HD MWF, CVA, HTN, diastolic heart failure (EF 60-65% and grade 1 diastolic dysfunction on 10/4032 ECHO), and GERD who presented to the ED with right sided trunk and back pain after she fell tried to sit down into her wheelchair. Pt also frequently misses dialysis sessions. She reports that she was last dialyzed on Wednesday 12/18.  MRI revealed T12-L1 discitis osteomyelitis with ventral epidural abscess mildly flattening the lower cord   Clinical Impression   Pt admitted with above. She demonstrates the below listed deficits and will benefit from continued OT to maximize safety and independence with BADLs Pt continues with bedrest orders and was seen at bed level.  She is oriented to self and place only.  She is lethargic and follows commands intermittently.  She will arouse to perform simple grooming tasks, and perform minimal exercise of UEs and LEs, but then drifts back to sleep.   She requires max - total A for ADLs.   Per chart, she lived with family and required a significant amount of care PTA, although she states she was living alone and was fully independent.  She will likely need SNF at discharge.   Recommend Palliative care consult       Follow Up Recommendations  SNF;Supervision/Assistance - 24 hour    Equipment Recommendations  None recommended by OT    Recommendations for Other Services       Precautions / Restrictions Precautions Precautions: Fall Restrictions Weight Bearing Restrictions: No Other Position/Activity Restrictions: On bed rest      Mobility Bed Mobility               General bed mobility comments: Pt would not attempt despite prompting   Transfers                 General transfer comment: unable to attempt due to bedrest orders      Balance                                           ADL either performed or assessed with clinical judgement   ADL Overall ADL's : Needs assistance/impaired Eating/Feeding: Maximal assistance;Bed level   Grooming: Wash/dry hands;Wash/dry face;Supervision/safety;Bed level Grooming Details (indicate cue type and reason): moderate cues/prompting  Upper Body Bathing: Total assistance;Bed level   Lower Body Bathing: Total assistance;Bed level   Upper Body Dressing : Total assistance;Bed level   Lower Body Dressing: Total assistance;Bed level   Toilet Transfer: Total assistance             General ADL Comments: Pt with bedrest orders and all activity at bed level.  Pt only minimally participatory      Vision   Additional Comments: keeps eyes closed      Perception     Praxis      Pertinent Vitals/Pain Pain Assessment: Faces Faces Pain Scale: Hurts little more Pain Location: Back Pain Descriptors / Indicators: Grimacing Pain Intervention(s): Limited activity within patient's tolerance;Premedicated before session     Hand Dominance Right   Extremity/Trunk Assessment Upper Extremity Assessment Upper Extremity Assessment: Generalized weakness   Lower Extremity Assessment Lower Extremity Assessment: Generalized weakness;Defer to PT evaluation       Communication Communication  Communication: No difficulties(minimal interactions )   Cognition Arousal/Alertness: Lethargic Behavior During Therapy: Flat affect Overall Cognitive Status: Impaired/Different from baseline Area of Impairment: Attention;Following commands;Orientation                 Orientation Level: Disoriented to;Time;Situation Current Attention Level: Focused   Following Commands: Follows one step commands inconsistently;Follows one step commands with increased time       General Comments: Pt will arouse minmally with stimulation and will follow commands occasionally, but  drifts back to sleep    General Comments       Exercises     Shoulder Instructions      Home Living Family/patient expects to be discharged to:: Private residence Living Arrangements: Children Available Help at Discharge: Family;Personal care attendant Type of Home: Apartment Home Access: Level entry     Home Layout: One level     Bathroom Shower/Tub: Teacher, early years/pre: Standard     Home Equipment: Cane - single point;Walker - 2 wheels;Wheelchair - manual   Additional Comments: above info taken from previous admission.  Pt  unable to provide info.        Prior Functioning/Environment Level of Independence: Needs assistance  Gait / Transfers Assistance Needed: pt reports daughter helps to wc, otherwise bed bound ADL's / Homemaking Assistance Needed: granddaughter and daughter bring food, and change diapers   Comments: Pt indicates to OT that she was living alone, and was doing everything for self, which is contraditroy to info provided during PT eval         OT Problem List: Decreased strength;Decreased activity tolerance;Impaired balance (sitting and/or standing);Decreased cognition;Decreased safety awareness;Decreased knowledge of use of DME or AE;Decreased knowledge of precautions;Pain      OT Treatment/Interventions: Self-care/ADL training;Therapeutic exercise;Energy conservation;DME and/or AE instruction;Therapeutic activities;Patient/family education    OT Goals(Current goals can be found in the care plan section) Acute Rehab OT Goals Patient Stated Goal: to do what I was doing before  OT Goal Formulation: With patient Time For Goal Achievement: 07/21/18 Potential to Achieve Goals: Fair ADL Goals Pt Will Perform Grooming: with set-up;sitting Pt Will Perform Upper Body Bathing: with min assist;sitting Pt Will Transfer to Toilet: with mod assist;ambulating Pt Will Perform Toileting - Clothing Manipulation and hygiene: with max assist;sit  to/from stand  OT Frequency: Min 2X/week   Barriers to D/C: Decreased caregiver support          Co-evaluation              AM-PAC OT "6 Clicks" Daily Activity     Outcome Measure Help from another person eating meals?: A Lot Help from another person taking care of personal grooming?: A Lot Help from another person toileting, which includes using toliet, bedpan, or urinal?: Total Help from another person bathing (including washing, rinsing, drying)?: Total Help from another person to put on and taking off regular upper body clothing?: Total Help from another person to put on and taking off regular lower body clothing?: Total 6 Click Score: 8   End of Session    Activity Tolerance: Patient limited by lethargy Patient left: in bed;with call bell/phone within reach;with bed alarm set  OT Visit Diagnosis: Muscle weakness (generalized) (M62.81)                Time: 7253-6644 OT Time Calculation (min): 11 min Charges:  OT General Charges $OT Visit: 1 Visit OT Evaluation $OT Eval Moderate Complexity: 1 Mod  Snow Peoples, OTR/L Acute Environmental education officer  [phone] Office [phone]   Lucille Passy M 07/07/2018, 10:30 AM

## 2018-07-07 NOTE — Progress Notes (Signed)
VAST RN to unit to speak with pt's nurse. Spoke with unit RN, Mickel Baas. Pt does not have any IV fluids or meds that must be given through PIV. Dilaudid can be given IM and abx is administered during dialysis tx. As pt is confused and is likely to pull out IV, no new placement completed at this time. Mickel Baas, RN verbalized understanding.

## 2018-07-07 NOTE — Progress Notes (Addendum)
[facility] Progress Note   Subjective: Oriented to self only. Has DC orders. Still no definite plans for SNF. Still C/O back pain.   Objective Vitals:   07/06/18 1731 07/06/18 2049 07/07/18 0440 07/07/18 0820  BP: (!) 108/44 112/66 (!) 136/50 115/65  Pulse: 88 93 95 98  Resp: 14 20 20  (!) 21  Temp: 98.1 F (36.7 C) 98.6 F (37 C)  98.5 F (36.9 C)  TempSrc: Oral Oral  Oral  SpO2: 100% 100% 100% 100%  Weight:      Height:       General:Frail elderly female in NAD. Oriented to self only YTKZS:W1,U9 2/6 systolic M 2/6 diastolic M. 2nd holosystolic M at apex Lungs:CTAB A/P Abdomen:soft, NT, active BS Extremities:No LE edema Dialysis Access:LUA AVF + bruit.Thickened scaling, dark areas over AVF arm.    Additional Objective Labs: Basic Metabolic Panel: Recent Labs  Lab 07/03/18 1909 07/04/18 0359 07/05/18 0306 07/06/18 0612 07/07/18 0801  NA 139 139 138 139 138  K 3.5 4.1 3.9 3.6 3.7  CL 99 97* 98 101 100  CO2 29 27 25 25 24   GLUCOSE 66* 55* 71 65* 81  BUN 19 22 29* 9 7*  CREATININE 5.37* 5.64* 6.57* 3.60* 2.59*  CALCIUM 8.1* 8.7* 8.7* 8.7* 8.8*  PHOS 4.3 5.7* 6.4*  --   --    Liver Function Tests: Recent Labs  Lab 07/03/18 1909 07/04/18 0359 07/05/18 0306  AST  --  11*  --   ALT  --  7  --   ALKPHOS  --  58  --   BILITOT  --  0.9  --   PROT  --  6.3*  --   ALBUMIN 2.1* 2.3* 2.1*   No results for input(s): LIPASE, AMYLASE in the last 168 hours. CBC: Recent Labs  Lab 07/03/18 0736 07/04/18 0359 07/05/18 0306 07/06/18 0612  WBC 4.4 2.6* 4.4 4.4  HGB 10.8* 11.9* 10.1* 10.5*  HCT 37.3 41.3 35.4* 35.6*  MCV 94.9 95.6 95.4 96.7  PLT 168 120* 127* 116*   Blood Culture    Component Value Date/Time   SDES ABSCESS T12 07/04/2018 1626   SPECREQUEST NONE 07/04/2018 1626   CULT  07/04/2018 1626    RARE PSEUDOMONAS AERUGINOSA NO ANAEROBES ISOLATED; CULTURE IN PROGRESS FOR 5 DAYS    REPTSTATUS PENDING 07/04/2018 1626     Cardiac Enzymes: No results for input(s): CKTOTAL, CKMB, CKMBINDEX, TROPONINI in the last 168 hours. CBG: No results for input(s): GLUCAP in the last 168 hours. Iron Studies: No results for input(s): IRON, TIBC, TRANSFERRIN, FERRITIN in the last 72 hours. @lablastinr3 @ Studies/Results: No results found. Medications: . ceFEPime (MAXIPIME) IV 2 g (07/06/18 1618)   . aspirin EC  81 mg Oral Daily  . calcitRIOL  2 mcg Oral Q M,W,F-HD  . Chlorhexidine Gluconate Cloth  6 each Topical Q0600  . darbepoetin (ARANESP) injection - DIALYSIS  60 mcg Intravenous Q Mon-HD  . feeding supplement (NEPRO CARB STEADY)  237 mL Oral BID BM  . feeding supplement (PRO-STAT SUGAR FREE 64)  30 mL Oral BID  . ferric citrate  420 mg Oral TID WC  . heparin  5,000 Units Subcutaneous Q8H  . latanoprost  1 drop Both Eyes QHS  . multivitamin  1 tablet Oral QHS  . polyethylene glycol  17 g Oral Daily  . senna-docusate  1 tablet Oral BID     Dialysis Orders: [facility] MWF 3.5 hrs 180NRe 400/Autoflow 1.5 45.5 kg 3.0 K/  2.0 Ca  LUA AVF -No heparin -Mircera 225 mcg IV (Last dose 06/02/2018 Last HGB 10.2 06/22/18) -Calcitriol 0.25 mcg PO TIW (Last PTH 136 06/02/18 Last Phos 8.1 06/22/18)   Assessment/Plan: 1. s/p mech fall/obsteo with T12-L1 osteo with ventral epidural abscess- medical management per NS with 6 wk abtx - will be a challenge implementing this given outpatient noncompliance- prelim culture - rare pseudomonas- presently on Vanc and Maxipime 2. ESRD - MWF - Next HD Friday at OP center. Historically noncompliant with HD.  3. Anemia - hgb 10.5 - stable - last ESA dose 225 11/26 - at some point hgb will drop -resume lower dose Aranesp at 60 4. Secondary hyperparathyroidism - P trending up- not checked today - as Rx aurixya - no change in dose for now 5. HTN/volume - BP/volume ok  6. Malnutrition - continues to lose weight, alb low- has prostat - will liberalize diet to regular with fluid restriction  and add nepro 7. Dementia - patient has no insight; 8. Goals of care - family still wishes full court press - she will not do well; QOL is poor and prognosis is the same 9. Hx CVA/AS/mod MS- EF ok 10. Thrombocytopenia - not clear why plts trending down -she is on no heparin HD- watch as she is getting SQ heparin here  Disposition: DC orders noted. No note from MSW regarding SNF placement. Concerned she will not complete ABX as ordered D/T marked non-compliance with HD  Rita H. Brown NP-C 07/07/2018, 10:28 AM  [employer] [phone]  I have seen and examined this patient and agree with the plan of care. Next HD scheduled for Wed; no acute indication today. C/o lower back pain; food on the floor also. Appears to have spit it out; she was c/o a medication this AM and I wonder if she to avoid taking it.    Dwana Melena, MD 07/07/2018, 12:18 PM

## 2018-07-07 NOTE — Discharge Summary (Addendum)
Name: Nancy [name] MRN: [id number] DOB: 04/23/1941 77 y.o. PCP: Aldine Contes, MD  Date of Admission: 07/03/2018  7:18 AM Date of Discharge: 07/29/2018 Attending Physician: Oda Kilts, MD  Discharge Diagnosis: 1. T12-L1 Osteomyelitis with ventral epidural abscess 2. ESRD on HD 3. Dementia  Discharge Medications: Allergies as of 07/29/2018      Reactions   Hydrocodone Nausea And Vomiting, Other (See Comments)   Dizziness, also   Tape Other (See Comments)   TAPE LEAVES DARK PATCHES ON THE SKIN!!      Medication List    TAKE these medications   aspirin 81 MG EC tablet Take 1 tablet (81 mg total) by mouth daily.   calcitRIOL 0.5 MCG capsule Commonly known as:  ROCALTROL Take 4 capsules (2 mcg total) by mouth every Monday, Wednesday, and Friday with hemodialysis.   ciprofloxacin 500 MG tablet Commonly known as:  CIPRO Take 1 tablet (500 mg total) by mouth daily.   diphenhydrAMINE-zinc acetate cream Commonly known as:  BENADRYL Apply topically 3 (three) times daily as needed for itching.   latanoprost 0.005 % ophthalmic solution Commonly known as:  XALATAN Place 1 drop into both eyes at bedtime.   midodrine 10 MG tablet Commonly known as:  PROAMATINE Take 1 tablet (10 mg total) by mouth daily. Take on dialysis days.   multivitamin Tabs tablet Take 1 tablet by mouth at bedtime.   OLANZapine 2.5 MG tablet Commonly known as:  ZYPREXA Take 1 tablet (2.5 mg total) by mouth at bedtime.   oxyCODONE-acetaminophen 5-325 MG tablet Commonly known as:  PERCOCET Take 0.5-1 tablets by mouth every 4 (four) hours as needed.   senna-docusate 8.6-50 MG tablet Commonly known as:  Senokot-S Take 1 tablet by mouth at bedtime as needed for mild constipation.       Disposition and follow-up:   [name] was discharged from [facility] in Stable condition.  At the hospital follow up visit please address:  1. T12-L1 Osteomyelitis with  ventral epidural abscess - Wound culture of abscess grew pseudomonas  - Was unable to tolerate IV abx therapy, so she was switched to ciprofloxacin - She will need to continue cipro for a total of 4-6 weeks - Repeat thoracic spine MRI around 08/21/2018 (marking 6 weeks of abx therapy) - If abscess has resolved, discontinue antibiotics at that time  2. ESRD on HD - Transitioned to a [facility]ThSa HD schedule to be continued at her outpatient facility - She is scheduled for the first shift available. She and a family member need to present to HD at 7:45am to start treatment at 8:05. Dialysis will not be performed if Ms. Hirsch does not have a family member present to sit with her for the duration of her treatment.  3. Dementia - Started on low dose Zyprexa qhs with improvement in behavior - Found not to have capacity by psychiatry. Her daughter is healthcare power of attorney.   4.  Labs / imaging needed at time of follow-up: thoracic spine MRI  5.  Pending labs/ test needing follow-up: none  Follow-up Appointments: Contact information for after-discharge care    Destination    [facility] Preferred SNF .   Service:  Skilled Nursing Contact information: [address] [place] Hospital Course by problem list: 64 yoF with ESRD on HD MWF, CVA, HTN, g1dd presenting with right sided trunk/back  pain after a fall, found to have osteomylitis/discitis of T12-L1 with ventral epidural abscess   1. Osteomyelitis with ventral epidural abscess: MRI showed mild flattening of the lower cord. Neuro exam remained unchanged and normal throughout hospital stay. She was afebrile without leukocytosis. She was started on IV vanco and cefepime which was narrowed to cefepime when abscess cultures (obtained by IR) resulted in pansensitive pseudomonas. She was unable to tolerate IV therapy due to dementia and agitation and was switched to ciprofloxacin  500mg  daily. Neurosurgery was consulted and recommended serial neuro exams and repeat MRI in 6 weeks to ensure decrease in size or resolution of abscess before discontinuing antibiotics.   2. ESRD on HD: Patient's behavior improved during HD to the point where she is acceptable for outpatient HD. She will continue her regular HD schedule on discharge. Family has agreed to be present at every session to help with her behavioral issues. She will not be dialyzed if family is not present.  3. Dementia: Started on Zyprexa qhs for dementia and agitation with improvement in her behavior and mood.  Discharge Vitals:   BP (!) 95/52 (BP Location: Right Arm)   Pulse 91   Temp (!) 97.4 F (36.3 C) (Oral)   Resp 18   Ht 5\' 3"  (1.6 m)   Wt 40 kg   SpO2 100%   BMI 15.62 kg/m   Pertinent Labs, Studies, and Procedures:  CBC Latest Ref Rng & Units 07/28/2018 07/25/2018 07/23/2018  WBC 4.0 - 10.5 K/uL 2.2(L) 6.8 4.6  Hemoglobin 12.0 - 15.0 g/dL 9.0(L) 9.7(L) 10.6(L)  Hematocrit 36.0 - 46.0 % 30.4(L) 33.9(L) 35.5(L)  Platelets 150 - 400 K/uL 127(L) 164 153   CMP Latest Ref Rng & Units 07/28/2018 07/25/2018 07/23/2018  Glucose 70 - 99 mg/dL 63(L) 84 114(H)  BUN 8 - 23 mg/dL 25(H) 22 16  Creatinine 0.44 - 1.00 mg/dL 5.36(H) 4.31(H) 4.51(H)  Sodium 135 - 145 mmol/L 138 137 138  Potassium 3.5 - 5.1 mmol/L 4.1 5.2(H) 4.8  Chloride 98 - 111 mmol/L 102 102 100  CO2 22 - 32 mmol/L 25 26 29   Calcium 8.9 - 10.3 mg/dL 8.9 10.6(H) 11.0(H)  Total Protein 6.5 - 8.1 g/dL - - -  Total Bilirubin 0.3 - 1.2 mg/dL - - -  Alkaline Phos 38 - 126 U/L - - -  AST 15 - 41 U/L - - -  ALT 0 - 44 U/L - - -   MR Thoracic spine 07/04/2018 T12-L1 discitis osteomyelitis (please note different numbering than on prior thoracic spine CT) with ventral epidural abscess mildly flattening the lower cord.  CT Thoracic spine 07/04/2018 1. CT findings most suspicious for diskitis and osteomyelitis at T12-L1 with destroyed endplates and  associated surrounding soft tissue mass/inflammatory phlegmon/abscess. Recommend MR imaging without and with contrast, if possible, for further evaluation and to exclude epidural abscess. 2. Normal alignment of the thoracic vertebral bodies and no acute Fractures.  DG T and L Spine 86/76/1948 Age uncertain but suspected recent anterior wedging of the T11 vertebral body. No other evident fracture. No spondylolisthesis. There are areas of osteoarthritic change. There is aortic Atherosclerosis.  No fracture or spondylolisthesis. Mild disc space narrowing at L2-3. Facet osteoarthritic change at L4-5 and L5-S1 noted bilaterally.  Small calcified uterine leiomyoma in the pelvis. Extensive pelvic vascular calcification as well as aortic atherosclerosis.  CT Aspiration 07/04/2018 Abnormal paraspinal tissue at the level of T12-L1. Needle was directed into the abnormal paraspinal tissue along the right side.  Small amount of bloody material was aspirated as the needles were withdrawn. IMPRESSION: CT-guided aspiration of bloody material from the T12-L1 right paraspinal region.  Results for orders placed or performed during the hospital encounter of 07/03/18  MRSA PCR Screening     Status: None   Collection Time: 07/03/18  1:53 PM  Result Value Ref Range Status   MRSA by PCR NEGATIVE NEGATIVE Final    Comment:        The GeneXpert MRSA Assay (FDA approved for NASAL specimens only), is one component of a comprehensive MRSA colonization surveillance program. It is not intended to diagnose MRSA infection nor to guide or monitor treatment for MRSA infections. Performed at [facility], [place] [address], [place], [place] [address]   Blood culture (routine x 2)     Status: None   Collection Time: 07/03/18  2:01 PM  Result Value Ref Range Status   Specimen Description BLOOD BLOOD RIGHT HAND  Final   Special Requests AEROBIC BOTTLE ONLY Blood Culture adequate volume  Final   Culture    Final    NO GROWTH 5 DAYS Performed at [facility], [place] [address], [place], [place] [address]    Report Status 07/08/2018 FINAL  Final  Blood culture (routine x 2)     Status: None   Collection Time: 07/03/18  2:01 PM  Result Value Ref Range Status   Specimen Description BLOOD BLOOD RIGHT HAND  Final   Special Requests   Final    AEROBIC BOTTLE ONLY Blood Culture results may not be optimal due to an inadequate volume of blood received in culture bottles   Culture   Final    NO GROWTH 5 DAYS Performed at [facility], [place] [address], [place], [place] [address]    Report Status 07/08/2018 FINAL  Final  Aerobic/Anaerobic Culture (surgical/deep wound)     Status: None   Collection Time: 07/04/18  4:26 PM  Result Value Ref Range Status   Specimen Description ABSCESS T12  Final   Special Requests NONE  Final   Gram Stain NO WBC SEEN NO ORGANISMS SEEN   Final   Culture   Final    RARE PSEUDOMONAS AERUGINOSA NO ANAEROBES ISOLATED Performed at [facility], [place] [address] [place], [place] [address]    Report Status 07/09/2018 FINAL  Final   Organism ID, Bacteria PSEUDOMONAS AERUGINOSA  Final      Susceptibility   Pseudomonas aeruginosa - MIC*    CEFTAZIDIME 2 SENSITIVE Sensitive     CIPROFLOXACIN <=0.25 SENSITIVE Sensitive     GENTAMICIN <=1 SENSITIVE Sensitive     IMIPENEM 1 SENSITIVE Sensitive     PIP/TAZO <=4 SENSITIVE Sensitive     CEFEPIME <=1 SENSITIVE Sensitive     * RARE PSEUDOMONAS AERUGINOSA  Fungus Culture With Stain     Status: None (Preliminary result)   Collection Time: 07/04/18  4:26 PM  Result Value Ref Range Status   Fungus Stain Final report  Final    Comment: (NOTE) Performed At: [facility] [place], [place] [id number] Rush Farmer MD [id number]    Fungus (Mycology) Culture PENDING  Incomplete   Fungal Source ABSCESS  Final    Comment: T12 Performed at [facility], [place] [address]  [place], [place] [address]   Fungus Culture Result     Status: None   Collection Time: 07/04/18  4:26 PM  Result Value Ref Range Status  Result 1 Comment  Final    Comment: (NOTE) KOH/Calcofluor preparation:  no fungus observed. Performed At: [facility] [place], [place] [id number] Rush Farmer MD WU:[id number]      Discharge Instructions: Discharge Instructions    Increase activity slowly   Complete by:  As directed       Signed: Serai Tukes, [name], MD 07/29/2018, 10:26 AM   Pager: [phone]

## 2018-07-07 NOTE — NC FL2 (Signed)
[place] LEVEL OF CARE SCREENING TOOL     IDENTIFICATION  Patient Name: Nancy [name] Birthdate: 1940/10/12 Sex: female Admission Date (Current Location): 07/03/2018  [place] and [place] Number:  Nancy [name] [id number] [place] and Address:  [employer], [place] [address] [place], [place] [address]      Provider Number: [id number]  Attending Physician Name and Address:  Bartholomew Crews, MD  Relative Name and Phone Number:  [name], daughter, [phone]    Current Level of Care: Hospital Recommended Level of Care: [place] Prior Approval Number:    Date Approved/Denied:   PASRR Number: [id number](Eff. 05/05/18)  Discharge Plan: SNF    Current Diagnoses: Patient Active Problem List   Diagnosis Date Noted  . Epidural abscess 07/04/2018  . Acute osteomyelitis of thoracic spine ([place]) 07/03/2018  . DNR (do not resuscitate) discussion   . Right renal mass   . Chronic cough 04/17/2018  . Acute gastritis without hemorrhage   . Duodenitis with bleeding   . ESRD (end stage renal disease) ([place])   . Papular lichenification 56/43/3295  . Acute on chronic blood loss anemia 03/06/2018  . Palliative care by specialist   . Protein-calorie malnutrition, severe 01/12/2018  . Midline thoracic back pain 12/23/2017  . Symptomatic anemia 02/17/2017  . Secondary hyperparathyroidism of renal origin ([place]) 01/15/2017  . Mitral valve annular calcification   . Dizziness 12/08/2016  . Angina at rest [facility])   . Goals of care, counseling/discussion   . Aortic stenosis 05/21/2016  . Anemia associated with chronic renal failure 02/01/2016  . Atherosclerosis of aorta ([place]) 01/11/2015  . End stage renal disease ([place]) 02/04/2013  . Non-intractable vomiting with nausea 08/17/2012  . Glaucoma 03/18/2012  . Health care maintenance 09/17/2011  . Osteoarthrosis involving lower leg 10/31/2008  . History of CVA (cerebrovascular accident)  01/28/2008  . Hyperlipidemia 02/13/2007  . PULMONARY NODULES 01/12/2007  . Left ventricular hypertrophy 09/02/2006  . Essential hypertension 08/11/2006  . GERD 08/11/2006    Orientation RESPIRATION BLADDER Height & Weight     Self  Normal Continent Weight: 92 lb 2.4 oz (41.8 kg) Height:  5\' 3"  (160 cm)  BEHAVIORAL SYMPTOMS/MOOD NEUROLOGICAL BOWEL NUTRITION STATUS      Continent Diet(see discharge summary)  AMBULATORY STATUS COMMUNICATION OF NEEDS Skin   Extensive Assist Verbally Surgical wounds(closed incision on lower back with gauze changed PRN)                       Personal Care Assistance Level of Assistance  Bathing, Feeding, Dressing Bathing Assistance: Maximum assistance Feeding assistance: Limited assistance Dressing Assistance: Maximum assistance     Functional Limitations Info  Speech, Hearing, Sight Sight Info: Impaired Hearing Info: Adequate Speech Info: Adequate    SPECIAL CARE FACTORS FREQUENCY  PT (By licensed PT), OT (By licensed OT)     PT Frequency: 5x week OT Frequency: 5x week            Contractures      Additional Factors Info  Code Status, Allergies Code Status Info: DNR Allergies Info: HYDROCODONE, TAPE            Current Medications (07/07/2018):  This is the current hospital active medication list Current Facility-Administered Medications  Medication Dose Route Frequency Provider Last Rate Last Dose  . acetaminophen (TYLENOL) tablet 650 mg  650 mg Oral Q6H PRN Chundi, Verne Spurr, MD   650 mg at 07/04/18 0507   Or  .  acetaminophen (TYLENOL) suppository 650 mg  650 mg Rectal Q6H PRN Chundi, Vahini, MD      . aspirin EC tablet 81 mg  81 mg Oral Daily Chundi, Vahini, MD   81 mg at 07/07/18 7034  . calcitRIOL (ROCALTROL) capsule 2 mcg  2 mcg Oral Q M,W,F-HD Lars Mage, MD   2 mcg at 07/06/18 1512  . ceFEPIme (MAXIPIME) 2 g in sodium chloride 0.9 % 100 mL IVPB  2 g Intravenous Q M,W,F-HD Axel Filler, MD 200 mL/hr at  07/06/18 1618 2 g at 07/06/18 1618  . Chlorhexidine Gluconate Cloth 2 % PADS 6 each  6 each Topical Q0600 Valentina Gu, NP      . Darbepoetin Alfa (ARANESP) injection 60 mcg  60 mcg Intravenous Q Mon-HD Alric Seton, PA-C   60 mcg at 07/06/18 1514  . diphenhydrAMINE-zinc acetate (BENADRYL) 2-0.1 % cream   Topical TID PRN Neva Seat, MD      . feeding supplement (NEPRO CARB STEADY) liquid 237 mL  237 mL Oral BID BM Alric Seton, PA-C   237 mL at 07/07/18 0352  . feeding supplement (PRO-STAT SUGAR FREE 64) liquid 30 mL  30 mL Oral BID Valentina Gu, NP   30 mL at 07/07/18 4818  . ferric citrate (AURYXIA) tablet 420 mg  420 mg Oral TID WC Valentina Gu, NP   420 mg at 07/07/18 5909  . heparin injection 5,000 Units  5,000 Units Subcutaneous Q8H Lars Mage, MD   5,000 Units at 07/04/18 2223  . HYDROmorphone (DILAUDID) injection 0.5 mg  0.5 mg Intravenous Q6H PRN Chundi, Vahini, MD   0.5 mg at 07/06/18 2241  . latanoprost (XALATAN) 0.005 % ophthalmic solution 1 drop  1 drop Both Eyes QHS Chundi, Vahini, MD   1 drop at 07/06/18 2243  . multivitamin (RENA-VIT) tablet 1 tablet  1 tablet Oral Nile Riggs, MD   1 tablet at 07/06/18 2242  . polyethylene glycol (MIRALAX / GLYCOLAX) packet 17 g  17 g Oral Daily Ina Homes, MD   17 g at 07/07/18 3112  . promethazine (PHENERGAN) tablet 12.5 mg  12.5 mg Oral Q6H PRN Chundi, Vahini, MD   12.5 mg at 07/06/18 1513  . senna-docusate (Senokot-S) tablet 1 tablet  1 tablet Oral BID Ina Homes, MD   1 tablet at 07/07/18 1624     Discharge Medications: Please see discharge summary for a list of discharge medications.  Relevant Imaging Results:  Relevant Lab Results:   Additional Information [id number]. Dialysis patient MWF at [facility]

## 2018-07-07 NOTE — Progress Notes (Addendum)
   Subjective:   Hollering for someone to come clean up stool off the floor when no stool is there. Asking someone to wipe her bottom when the nurse tech just got done cleaning her up. Stating she can't roll over because her back will hurt. Oriented to self, location, date. States she is in the hospital because she fell and hurt her back. I remind her that she also has an infection in her spine. She has been refusing PT and HD, but was able to get an HD session in yesterday. I spoke with her about her will to live and she has very limited insight. Stating that she wants to just be left alone and let nature takes its course since things have been challenging already. But when asked if she wants to continue dialysis and antibiotic therapy for her infection, she says yes.   Objective: Vital signs in last 24 hours: Vitals:   07/06/18 1654 07/06/18 1731 07/06/18 2049 07/07/18 0440  BP: (!) 106/34 (!) 108/44 112/66 (!) 136/50  Pulse: 92 88 93 95  Resp: 16 14 20 20   Temp: (!) 97.5 F (36.4 C) 98.1 F (36.7 C) 98.6 F (37 C)   TempSrc: Oral Oral Oral   SpO2: 100% 100% 100% 100%  Weight: 41.8 kg     Height:       Gen: laying in bed, NAD, hollering for things to be done but is easily redirectable and able to have appropriate conversation. Neuro: sensation is symmetric in bilateral lower extremities with full ROM and 4+/5 strength bilaterally. She is able to pull both legs up in the air and towards her chest without pain.   Assessment/Plan:  Principal Problem:   Acute osteomyelitis of thoracic spine (HCC) Active Problems:   ESRD (end stage renal disease) ([place])   Epidural abscess  89 yoF with ESRD on HD MWF, CVA, HTN, g1dd presenting with right sided trunk/back pain after a fall, found to have osteomylitis/discitis of T12-L1 with ventral epidural abscess  1. Osteomyelitis with ventral epidural abscess: Stable. Afebrile. Neuro exam remains normal and unchanged. She continues to have very limited  insight into her disease. Endorses back and leg pain with movement but distracted exam reveals no pain.  - continue IV cefepime with HD, last dose was yesterday.  - spoke with neurosurgery:  Recommend repeat MRI in 6 weeks before discontinuing antibiotics.   2. ESRD: stable. Had HD yesterday. Bps remain stably low. Labs this morning are pending.    Dispo: Anticipated discharge in several day. PT recommending SNF. Family in agreement. Patient changes her mind frequently. SW has been consulted.     Isabelle Course, MD 07/07/2018, 7:42 AM Pager: [phone]

## 2018-07-07 NOTE — Progress Notes (Signed)
Patient pulled out IV. Patient no longer has IV access. Will continue to monitor.   Farley Ly RN

## 2018-07-08 LAB — CULTURE, BLOOD (ROUTINE X 2)
Culture: NO GROWTH
Culture: NO GROWTH
Special Requests: ADEQUATE

## 2018-07-08 LAB — CBC
HCT: 34.9 % — ABNORMAL LOW (ref 36.0–46.0)
Hemoglobin: 10.2 g/dL — ABNORMAL LOW (ref 12.0–15.0)
MCH: 27.6 pg (ref 26.0–34.0)
MCHC: 29.2 g/dL — ABNORMAL LOW (ref 30.0–36.0)
MCV: 94.6 fL (ref 80.0–100.0)
Platelets: 121 10*3/uL — ABNORMAL LOW (ref 150–400)
RBC: 3.69 MIL/uL — AB (ref 3.87–5.11)
RDW: 16.6 % — ABNORMAL HIGH (ref 11.5–15.5)
WBC: 4.7 10*3/uL (ref 4.0–10.5)
nRBC: 0 % (ref 0.0–0.2)

## 2018-07-08 MED ORDER — HYDROMORPHONE HCL 2 MG PO TABS
2.0000 mg | ORAL_TABLET | ORAL | Status: DC | PRN
  Administered 2018-07-08 – 2018-07-10 (×6): 2 mg via ORAL
  Filled 2018-07-08 (×7): qty 1

## 2018-07-08 MED ORDER — CHLORHEXIDINE GLUCONATE CLOTH 2 % EX PADS: 6.0000 | MEDICATED_PAD | Freq: Every day | CUTANEOUS | Status: DC

## 2018-07-08 NOTE — Progress Notes (Addendum)
[facility] Progress Note   Dialysis Orders: [place] MWF 3.5 hrs 180NRe 400/Autoflow 1.5 45.5 kg 3.0 K/ 2.0 Ca  LUA AVF -No heparin -Mircera 225 mcg IV (Last dose 06/02/2018 Last HGB 10.2 06/22/18) -Calcitriol 0.25 mcg PO TIW (Last PTH 136 06/02/18 Last Phos 8.1 06/22/18)  Assessment/Plan: 1. s/p mech fall/obsteo with T12-L1 osteo with ventral epidural abscess- medical management per NS with 6 wk abtx - will be a challenge implementing this given outpatient noncompliance- prelim culture - rare pseudomonas- presently on Vanc and Maxipime (we can give fortaz at the outpt HD unit) .  Need to try dialysis in a recliner to see if she can tolerate this though it is difficult to assess since she is miserable and yells a lot at baseline.  Still,  SNF placement will require transportation and prolonged sitting which may not be realistic goal   2. ESRD - MWF - HD today - yelled much of dialysis Monday - family is required to sit with her throughout outpt HD due to disruptive behavior - this is a big issue 3. Anemia - hgb 10.2 - stable -Aranesp at 60 q Monday 4. Secondary hyperparathyroidism - on auryxia - no change in dose for now- intact erratic and likely doesn't always coincide with binders 5. HTN/volume - BP/volume ok - net UF 450 cc 12/30  6. Malnutrition - continues to lose weight, alb low- has prostat - will liberalize diet to regular with fluid restriction and added nepro - had 820 cc intake Tuesday  7. Dementia - not sure patient is capable of  insight; 8. Goals of care - family still wishes full court press - she will not do well; QOL is poor and prognosis is the same 9. Hx CVA/AS/mod MS- EF ok 10. Thrombocytopenia - not clear why plts trending down -she is on no heparin HD- watch as she is getting SQ heparin here- stable 110 - [place], PA-C [place] 07/08/2018,9:45 AM  LOS: 5 days   I have seen and examined this patient and  agree with the plan of care. Will attempt dialysis today to get her on MWF outpt regimen. C/o lower back pain.  Dwana Melena, MD 07/08/2018, 11:29 AM   Subjective:   C/o water not being fresh. C/o heating pad not helping (actually placed about T12/L1 - d/w staff to reposition. Has not eating breakfast.  Objective Vitals:   07/07/18 0440 07/07/18 0820 07/07/18 2118 07/08/18 0918  BP: (!) 136/50 115/65 (!) 130/58 (!) 130/52  Pulse: 95 98 91 96  Resp: 20 (!) 21 19 18   Temp:  98.5 F (36.9 C) 98.1 F (36.7 C) 98.1 F (36.7 C)  TempSrc:  Oral Oral Oral  SpO2: 100% 100% 100% 100%  Weight:      Height:       Physical Exam General: thin malnourished elderly lady yelling about food/backpain Heart: RRR Lungs: grossly clear Abdomen: soft NT Extremities: no LE edema Dialysis Access:  Left AVF - thickened dark skin over AVF + bruit   Additional Objective Labs: Basic Metabolic Panel: Recent Labs  Lab 07/03/18 1909 07/04/18 0359 07/05/18 0306 07/06/18 0612 07/07/18 0801  NA 139 139 138 139 138  K 3.5 4.1 3.9 3.6 3.7  CL 99 97* 98 101 100  CO2 29 27 25 25 24   GLUCOSE 66* 55* 71 65* 81  BUN 19 22 29* 9 7*  CREATININE 5.37* 5.64* 6.57* 3.60* 2.59*  CALCIUM 8.1* 8.7*  8.7* 8.7* 8.8*  PHOS 4.3 5.7* 6.4*  --   --    Liver Function Tests: Recent Labs  Lab 07/03/18 1909 07/04/18 0359 07/05/18 0306  AST  --  11*  --   ALT  --  7  --   ALKPHOS  --  58  --   BILITOT  --  0.9  --   PROT  --  6.3*  --   ALBUMIN 2.1* 2.3* 2.1*   No results for input(s): LIPASE, AMYLASE in the last 168 hours. CBC: Recent Labs  Lab 07/03/18 0736 07/04/18 0359 07/05/18 0306 07/06/18 0612 07/08/18 0727  WBC 4.4 2.6* 4.4 4.4 4.7  HGB 10.8* 11.9* 10.1* 10.5* 10.2*  HCT 37.3 41.3 35.4* 35.6* 34.9*  MCV 94.9 95.6 95.4 96.7 94.6  PLT 168 120* 127* 116* 121*   Blood Culture    Component Value Date/Time   SDES ABSCESS T12 07/04/2018 1626   SPECREQUEST NONE 07/04/2018 1626   CULT   07/04/2018 1626    RARE PSEUDOMONAS AERUGINOSA NO ANAEROBES ISOLATED; CULTURE IN PROGRESS FOR 5 DAYS    REPTSTATUS PENDING 07/04/2018 1626    Cardiac Enzymes: No results for input(s): CKTOTAL, CKMB, CKMBINDEX, TROPONINI in the last 168 hours. CBG: No results for input(s): GLUCAP in the last 168 hours. Iron Studies: No results for input(s): IRON, TIBC, TRANSFERRIN, FERRITIN in the last 72 hours. Lab Results  Component Value Date   INR 1.13 03/29/2018   INR 1.03 03/06/2018   INR 1.30 02/17/2017   Studies/Results: No results found. Medications: . ceFEPime (MAXIPIME) IV 2 g (07/06/18 1618)   . aspirin EC  81 mg Oral Daily  . calcitRIOL  2 mcg Oral Q M,W,F-HD  . Chlorhexidine Gluconate Cloth  6 each Topical Q0600  . darbepoetin (ARANESP) injection - DIALYSIS  60 mcg Intravenous Q Mon-HD  . feeding supplement (NEPRO CARB STEADY)  237 mL Oral BID BM  . feeding supplement (PRO-STAT SUGAR FREE 64)  30 mL Oral BID  . ferric citrate  420 mg Oral TID WC  . heparin  5,000 Units Subcutaneous Q8H  . latanoprost  1 drop Both Eyes QHS  . multivitamin  1 tablet Oral QHS  . polyethylene glycol  17 g Oral Daily  . senna-docusate  1 tablet Oral BID

## 2018-07-08 NOTE — Progress Notes (Signed)
   Subjective: Patient states that she is not feeling well and her back pain is uncontrolled. She knows that she is at [facility] this AM. Discussed the plan to work on pain control and continue antibiotics. Discussed plan to arrange SNF placement. Patient agrees.   Objective: Vital signs in last 24 hours: Vitals:   07/06/18 2049 07/07/18 0440 07/07/18 0820 07/07/18 2118  BP: 112/66 (!) 136/50 115/65 (!) 130/58  Pulse: 93 95 98 91  Resp: 20 20 (!) 21 19  Temp: 98.6 F (37 C)  98.5 F (36.9 C) 98.1 F (36.7 C)  TempSrc: Oral  Oral Oral  SpO2: 100% 100% 100% 100%  Weight:      Height:       General: Thin elderly female in no acute distress Pulm: Good air movement with no wheezing or crackles  Neuro: Alert and oriented x 3, sensation to light touch intact in the bilateral LEs, gross strength 5/5 in the bilateral LEs  Assessment/Plan:  Principal Problem:   Acute osteomyelitis of thoracic spine (HCC) Active Problems:   ESRD (end stage renal disease) (HCC)   Epidural abscess  15 yoF with ESRD on HD MWF, CVA, HTN, g1dd presenting with right sided trunk/back pain after a fall, found to have osteomylitis/discitis of T12-L1 with ventral epidural abscess  1. Osteomyelitis with ventral epidural abscess: Stable. Afebrile. Continue cefepime with HD for a 6-8 week course. Neurosurg recommends repeat MRI prior to discontinuing antibiotics to make sure epidural abscess is improving.  - pain control: IM dilaudid, lost IV access yesterday. Switch to po dilaudid today  - Patient has had multiple palliative care visits in the past. Last one 1-2 months ago. Will recommend outpatient palliative follow-up  - discontinue bedrest, repeat PT/OT eval. Currently recommending SNF.   2. ESRD: plan for HD today and cefepime   Dispo: Anticipated discharge soon. SW working on [employer] placement   Winfield, Larkin Ina, MD 07/08/2018, 8:27 AM Pager: [phone]

## 2018-07-08 NOTE — Progress Notes (Signed)
Pharmacy Antibiotic Note  Nancy [name] is a 78 y.o. female admitted on 07/03/2018 with back pain.  Pharmacy consulted for Cefepime for Osteomyelitis. ESRD - HD MWF  Plan: Cefepime 2g IV post-HD MWF (6-8 weeks) Monitor HD schedule, clinical status, renal function, and culture results.    Height: 5\' 3"  (160 cm) Weight: 92 lb 2.4 oz (41.8 kg) IBW/kg (Calculated) : 52.4  Temp (24hrs), Avg:98.1 F (36.7 C), Min:98.1 F (36.7 C), Max:98.1 F (36.7 C)  Recent Labs  Lab 07/03/18 0736 07/03/18 1909 07/04/18 0359 07/05/18 0306 07/06/18 0612 07/07/18 0801 07/08/18 0727  WBC 4.4  --  2.6* 4.4 4.4  --  4.7  CREATININE 10.04* 5.37* 5.64* 6.57* 3.60* 2.59*  --     Estimated Creatinine Clearance: 12.4 mL/min (A) (by C-G formula based on SCr of 2.59 mg/dL (H)).    Allergies  Allergen Reactions  . Hydrocodone Nausea And Vomiting and Other (See Comments)    Dizziness, also  . Tape Other (See Comments)    TAPE LEAVES DARK PATCHES ON THE SKIN!!   Antimicrobials this admission:  12/28 cefepime>> (6-8 weeks) 12/28 vancomycin >> 12/29  Dose adjustments this admission:   Microbiology results:  12/27 BCx: ngtd 12/27 MRSA pcr negative 12/28 T12 Abscess: Pseudomonas Aeruginosa, pan S   Thank you for allowing [place] to participate in this patients care.   Jens Som, PharmD Please utilize [employer] (under [place]) for appropriate number for your unit pharmacist. 07/08/2018 11:02 AM

## 2018-07-08 NOTE — Progress Notes (Signed)
Patient is refusing vitals at this time. Will continue to monitor.   Farley Ly RN

## 2018-07-08 NOTE — Progress Notes (Signed)
Physical Therapy Treatment Patient Details Name: Nancy [name] MRN: [id number] DOB: 08/13/1940 Today's Date: 07/08/2018    History of Present Illness 78 yo female with a medical history of ESRD on HD MWF, CVA, HTN, diastolic heart failure (EF 60-65% and grade 1 diastolic dysfunction on 07/2246 ECHO), and GERD who presented to the ED with right sided trunk and back pain after she fell tried to sit down into her wheelchair. Pt also frequently misses dialysis sessions. She reports that she was last dialyzed on Wednesday 12/18.  MRI revealed T12-L1 discitis osteomyelitis with ventral epidural abscess mildly flattening the lower cord    PT Comments    Physical therapy treatment sessions continue to be limited due to cognition and agitation. RN reports staff just left room prior to my arrival from giving her a bath. Pt insisting that staff is lying about giving her a bath and is demanding that PT wash her up. We were able to reposition in bed however no meaningful therapeutic exercise or education was able to be completed at this time. Will continue to follow.     Follow Up Recommendations  SNF     Equipment Recommendations  None recommended by PT    Recommendations for Other Services       Precautions / Restrictions Precautions Precautions: Fall Precaution Comments: Attempted to educate on back precautions for comfort but unsuccessful.  Restrictions Weight Bearing Restrictions: No Other Position/Activity Restrictions: On bed rest; per orders, PT in bed.     Mobility  Bed Mobility Overal bed mobility: Needs Assistance Bed Mobility: Rolling Rolling: Modified independent (Device/Increase time)         General bed mobility comments: With significant encouragement, pt able to roll R and L to readjust bed pads under her. Pt was able to attempt to assist in scooting herself up to [facility] but +2 assist required to complete.  Transfers                 General transfer comment: unable  to attempt due to bedrest orders   Ambulation/Gait                 Stairs             Wheelchair Mobility    Modified Rankin (Stroke Patients Only)       Balance       Sitting balance - Comments: Unable to assess at this time.                                     Cognition Arousal/Alertness: Awake/alert Behavior During Therapy: Agitated;Restless Overall Cognitive Status: Impaired/Different from baseline Area of Impairment: Attention;Following commands;Orientation;Awareness;Problem solving;Memory                 Orientation Level: Disoriented to;Time;Situation Current Attention Level: Focused Memory: Decreased short-term memory;Decreased recall of precautions Following Commands: Follows one step commands inconsistently;Follows one step commands with increased time Safety/Judgement: Decreased awareness of deficits;Decreased awareness of safety Awareness: Intellectual Problem Solving: Slow processing;Decreased initiation;Requires verbal cues;Difficulty sequencing;Requires tactile cues        Exercises      General Comments        Pertinent Vitals/Pain Pain Assessment: Faces Faces Pain Scale: Hurts little more Pain Location: Back Pain Descriptors / Indicators: Grimacing Pain Intervention(s): Monitored during session;Repositioned    Home Living  Prior Function            PT Goals (current goals can now be found in the care plan section) Acute Rehab PT Goals Patient Stated Goal: No goals stated during session.  PT Goal Formulation: Patient unable to participate in goal setting Time For Goal Achievement: 07/18/18 Potential to Achieve Goals: Fair Progress towards PT goals: Progressing toward goals    Frequency    Min 2X/week      PT Plan Current plan remains appropriate    Co-evaluation              AM-PAC PT "6 Clicks" Mobility   Outcome Measure  Help needed turning from your  back to your side while in a flat bed without using bedrails?: A Little Help needed moving from lying on your back to sitting on the side of a flat bed without using bedrails?: A Lot Help needed moving to and from a bed to a chair (including a wheelchair)?: A Lot Help needed standing up from a chair using your arms (e.g., wheelchair or bedside chair)?: A Lot Help needed to walk in hospital room?: Total Help needed climbing 3-5 steps with a railing? : Total 6 Click Score: 11    End of Session Equipment Utilized During Treatment: Gait belt Activity Tolerance: Patient limited by pain;Treatment limited secondary to agitation Patient left: in bed;with call bell/phone within reach;with bed alarm set Nurse Communication: Mobility status PT Visit Diagnosis: Other abnormalities of gait and mobility (R26.89);Pain Pain - part of body: (back)     Time: 2707-8675 PT Time Calculation (min) (ACUTE ONLY): 11 min  Charges:  $Therapeutic Activity: 8-22 mins                     Rolinda Roan, PT, DPT Acute Rehabilitation Services Pager: [phone] Office: [place] 07/08/2018, 11:12 AM

## 2018-07-08 NOTE — Progress Notes (Signed)
Patient didn't get antibiotics in HD. Can not give on the floor, because patient has no IV access. HD nurse stated she will call pharmacy to reschedule for next HD day. Will continue to monitor.   Farley Ly RN

## 2018-07-08 NOTE — Care Management Important Message (Signed)
Important Message  Patient Details  Name: Nancy [name] MRN: [id number] Date of Birth: Mar 24, 1941   Medicare Important Message Given:  Yes    Barb Merino [place] 07/08/2018, 12:43 PM

## 2018-07-08 NOTE — Progress Notes (Signed)
.   Date: 07/08/2018  Patient name: Nancy [name]  Medical record number: [id number]  Date of birth: 1940/12/13   I have seen and evaluated this patient and I have discussed the plan of care with the house staff. Please see their note for complete details. I concur with their findings with the following additions/corrections: Nancy [name] was seen this morning on team rounds.  She is medically stable to be discharged but dispo plans are challenging.  Nephrology has noted that when she is discharged she will have to be able to complete dialysis in a recliner.  We are not certain whether she can achieve this.  They noted that she screamed there her whole dialysis session and requires family to be at her bedside.  The team noted that she has had many problems of care consults and the family always wants aggressive care.  It might be beneficial to get a psychiatry consult to further characterize her dementia and its severity.  I would also ask psychiatry if there are any pharmaceutical medications that would help with her behavior.  She was last evaluated by psychiatry in April 2019 primarily to assess her capacity.  Bartholomew Crews, MD 07/08/2018, 1:33 PM

## 2018-07-09 ENCOUNTER — Ambulatory Visit: Payer: Self-pay

## 2018-07-09 DIAGNOSIS — R451 Restlessness and agitation: Secondary | ICD-10-CM | POA: Diagnosis present

## 2018-07-09 DIAGNOSIS — M4624 Osteomyelitis of vertebra, thoracic region: Secondary | ICD-10-CM

## 2018-07-09 DIAGNOSIS — G062 Extradural and subdural abscess, unspecified: Secondary | ICD-10-CM

## 2018-07-09 LAB — AEROBIC/ANAEROBIC CULTURE W GRAM STAIN (SURGICAL/DEEP WOUND): Gram Stain: NONE SEEN

## 2018-07-09 LAB — AEROBIC/ANAEROBIC CULTURE (SURGICAL/DEEP WOUND)

## 2018-07-09 MED ORDER — OLANZAPINE 5 MG PO TBDP
2.5000 mg | ORAL_TABLET | Freq: Every evening | ORAL | Status: DC | PRN
  Filled 2018-07-09: qty 0.5

## 2018-07-09 NOTE — Progress Notes (Signed)
   Subjective: No overnight events. Per nephrology team, this morning the patient was shouting and refusing to be examined. Per nursing, she has been refusing her medications. On exam this morning, the patient was quietly laying in bed. She reported that she was cold and wanted her daughter to bring her clothes from home. She denied any pain. She states she has not been eating, but did not specify why.   Objective:  Vital signs in last 24 hours: Vitals:   07/08/18 1530 07/08/18 1536 07/08/18 2140 07/09/18 0459  BP: (!) 81/36 (!) 82/37 (!) 120/56 128/62  Pulse: 96 92 95 90  Resp:  14 16 18   Temp:  97.8 F (36.6 C) 98 F (36.7 C) 98 F (36.7 C)  TempSrc:  Oral Oral Oral  SpO2:  100%  98%  Weight:      Height:       Physical Exam Gen: Thin and frail. No distress. Neuro: Moves all four extremities spontaneously. Answers questions appropriately and follows commands. Cardiovascular:Systolic and diastolic murmur.  Pulmonary/Chest:Effort normal. Clear to auscultation bilaterally. No wheezes, rales, or rhonchi. Ext: LUE AVF with bruit and palpable thrill. No lower extremity edema. Skin: Warm and dry. Right axillary subcutaneous, flesh-colored nodules.  Assessment/Plan:  Principal Problem:   Acute osteomyelitis of thoracic spine (HCC) Active Problems:   ESRD (end stage renal disease) ([place])   Epidural abscess  Osteomyelitis with ventral epidural abscess - Stable. Afebrile. Currently getting cefepime with HD. Neurosurg recommends repeat MRI prior to discontinuing antibiotics to make sure epidural abscess is improving. Plan - Continue cefepime with HD for a 6-8 week course. .  - pain control: PO dilaudid 2mg  q4hrs PRN  ESRD on HD (MWF) - Last dialyzed 1/1 Plan - HD with cefepime tomorrow 1/3  Dementia and Agitation - Patient has history of dementia vs AMS with uremia. Her healthcare power of attorney is her daughter.  - During her hospitalization, she has been refusing her  medications and has had episodes of shouting and agitation.  - Would appreciate psychiatry evaluate for dementia, capacity, and possible medication management. Plan - Psychiatry consulted, appreciate recs  Dispo: Anticipated discharge pending SNF placement  Dorrell, [name], MD 07/09/2018, 6:17 AM Pager: [phone]

## 2018-07-09 NOTE — Progress Notes (Signed)
Medicine attending: I examined this patient today together with resident physician Dr. Vilma Prader and I concur with her evaluation and management plan. 78 year old woman with end-stage renal disease on dialysis, grade 1 diastolic cardiac dysfunction on July 2019 echocardiogram.  Unspecified dementia, aortic stenosis, prior stroke. She was admitted on December 27 with a 33-month history of progressive lumbar back pain radiating to the right side.  CT scan suspicious for a T11-12 discitis/osteomyelitis.  MRI confirmed discitis/osteomyelitis at level T12-L1 with the presence of a ventral epidural abscess with early mild flattening and displacement of the spinal cord.  She was evaluated by neurosurgery.    In view of her age, advanced comorbid medical conditions, and the necessity for a complicated surgical procedure and in view of the fact that she had no focal neurologic deficits, surgery was not advised.  Needle aspiration for diagnosis and culture showed no white cells, no organisms, but cultures grew Pseudomonas.  She was started on a planned 6-8-week course of cefepime. She has been afebrile since admission.  Initial white count 4400, repeat lower than normal at 2600.  Repeat now low normal at 4400.  I do not see a white count differential.  Moderate fall in platelet count from 168,000-120,000 and count has plateaued at that level.  Although infection appears to be under control, she has developed intermittent agitation requiring sedation.  Speech is fluent.  No focal neurologic deficits.  She continues on dialysis so electrolytes and renal function are stable and in expected range. Situation complicated by the fact that it appears that she has had intermittent changes in her mental status and temporarily stops dialysis then becomes uremic, comes to the hospital, and resumes dialysis.  Her healthcare power of attorney, her daughter, still wants full dialysis support.  We have asked psychiatry to  evaluate the patient to decide whether she is competent to make major decisions.

## 2018-07-09 NOTE — Consult Note (Addendum)
[facility] Consult   Reason for Consult:  Capacity evaluation, medication recommendation for agitation Referring Physician:  Dr. Vilma Prader Patient Identification: Nancy [name] MRN:  [id number] Principal Diagnosis: Acute osteomyelitis of thoracic spine [facility]) Diagnosis:  Principal Problem:   Acute osteomyelitis of thoracic spine ([place]) Active Problems:   ESRD (end stage renal disease) ([place])   Epidural abscess   Total Time spent with patient: 30 minutes  Subjective:   Nancy [name] is a 78 y.o. female with ESRD, who receives treatment for osteomyelitis with ventral epidural abscess. Psychiatry is consulted for capacity evaluation for proceeding with dialysis, and medical recommendation for agitation.   HPI:   Per nursing report, the patient refuses to take medication and refused to get medical exam by her nephrologist. She tried to grab hands to refuse care. She pulled lines yesterday. She yells, and has been disoriented during the shift. She has not tried to get out of the bed.   Per OT evaluation 07/07/2018 "She is lethargic and follows commands intermittently.  She will arouse to perform simple grooming tasks, and perform minimal exercise of UEs and LEs, but then drifts back to sleep."   Per chart review, psychiatry was consulted on 10/2017 for capacity for medical treatment including dialysis. Per chart, patient did not have capacity to refuse medical treatment including dialysis. "She is disoriented and does not appear to have an understanding of her current medical condition." " She has been responding to internal stimuli and agitated which is an acute change from her baseline mental status. She is likely presenting with delirium secondary to her medical condition."   Per nephrology note on 09/2016 "she has declined hemodialysis and is willing to embrace her mortality as a result of this. We have had a lengthy discussion and she is unwavering on her decision  regarding not pursuing dialysis. I agree with palliative care and home with hospice in this situation." She was readmitted in 02/2017 due to weakness and right knee pain, found to have Hgb of 5.1 and BUN/Cr of 143/12.13.  She was amenable to proceed with HD.   MMSE, MOCA or any evaluation of dementia is available per chart review.   Patient is evaluated in the room.  She is a very limited historian due to her current mental status. She is holding a phone, stating that she contacted 911 asking for help as she has shortness of breath.  She ruminates on this complaint while she is  resting in her bed. She then asks "what do you want," and mumbles some words.   She is unable to tell her name, place, or situation.  She is unable to follow any commands or answer any questions.  Discussed with the outpatient nephrologist in the unit. Patient reportedly tried to pull catheter while on dialysis, which would risk the patient's medical condition. He is planning to discuss with her family members in regards to possible withdrawal from dialysis.   Past Psychiatric History: unable to obtain  Risk to Self:  no (except that she refuses medical care) Risk to Others:  no Prior Inpatient Therapy:  no Prior Outpatient Therapy:  no  Past Medical History:  Past Medical History:  Diagnosis Date  . Anemia   . Aortic stenosis   . Bacterial sinusitis 09/17/2011  . CHF (congestive heart failure) ([place])   . CKD (chronic kidney disease) stage 4, GFR 15-29 ml/min ([place]) 08/11/2006   Cr continues to increase. Proteinuria on UA 02/10/12.    Marland Kitchen  Colitis   . CVA (cerebrovascular accident) [facility])    New hemorrhagic per CT scan '09  . Diverticulosis of colon   . Dysfunctional uterine bleeding   . ESRD (end stage renal disease) on dialysis ([place])    "MWF; [facility]" (11/27/2017)  . Fecal impaction ([place])   . Headache(784.0)   . Heart murmur   . HERNIORRHAPHY, HX OF 08/11/2006  . Hypertension   . OA (osteoarthritis)    bilateral  knees  . Postmenopausal   . Pulmonary nodule   . TINEA CRURIS 01/12/2007    Past Surgical History:  Procedure Laterality Date  . ABDOMINAL HYSTERECTOMY    . AV FISTULA PLACEMENT Left 02/19/2017   Procedure: CREATION OF LEFT ARM BRACHIOCEPHALIC ARTERIOVENOUS (AV) FISTULA;  Surgeon: Rosetta Posner, MD;  Location: [place];  Service: Vascular;  Laterality: Left;  . BASCILIC VEIN TRANSPOSITION Left 04/23/2017   Procedure: BASILIC VEIN TRANSPOSITION SECOND STAGE;  Surgeon: Rosetta Posner, MD;  Location: [place];  Service: Vascular;  Laterality: Left;  . BIOPSY  02/20/2018   Procedure: BIOPSY;  Surgeon: Carol Ada, MD;  Location: [facility];  Service: Endoscopy;;  . BIOPSY  03/07/2018   Procedure: BIOPSY;  Surgeon: Carol Ada, MD;  Location: [facility] ENDOSCOPY;  Service: Endoscopy;;  . CHOLECYSTECTOMY  2009  . COLONOSCOPY    . ESOPHAGOGASTRODUODENOSCOPY N/A 03/07/2018   Procedure: ESOPHAGOGASTRODUODENOSCOPY (EGD);  Surgeon: Carol Ada, MD;  Location: [place];  Service: Endoscopy;  Laterality: N/A;  . ESOPHAGOGASTRODUODENOSCOPY (EGD) WITH PROPOFOL N/A 02/20/2018   Procedure: ESOPHAGOGASTRODUODENOSCOPY (EGD) WITH PROPOFOL;  Surgeon: Carol Ada, MD;  Location: [facility] ENDOSCOPY;  Service: Endoscopy;  Laterality: N/A;  . INGUINAL HERNIA REPAIR  2008  . INSERTION OF DIALYSIS CATHETER Right 02/19/2017   Procedure: INSERTION OF TUNNELED DIALYSIS CATHETER - RIGHT INTERNAL JUGULAR PLACEMENT;  Surgeon: Rosetta Posner, MD;  Location: [place];  Service: Vascular;  Laterality: Right;  . IRIDOTOMY / IRIDECTOMY     Laser, right eye 12/26/11 left eye 01/24/12  . MASS EXCISION Left 05/07/2013   Procedure: EXCISION CYST;  Surgeon: Myrtha Mantis., MD;  Location: [place];  Service: Ophthalmology;  Laterality: Left;  . SAVORY DILATION N/A 02/20/2018   Procedure: SAVORY DILATION;  Surgeon: Carol Ada, MD;  Location: [facility] ENDOSCOPY;  Service: Endoscopy;  Laterality: N/A;   Family History:  Family  History  Problem Relation Age of Onset  . Hypertension Mother   . Congestive Heart Failure Mother   . Heart attack Brother 54   Family Psychiatric  History: unable to obtain Social History:  Social History   Substance and Sexual Activity  Alcohol Use Not on file     Social History   Substance and Sexual Activity  Drug Use No   Comment: 08/15/08 UDS + cocaine    Social History   Socioeconomic History  . Marital status: Widowed    Spouse name: Not on file  . Number of children: Not on file  . Years of education: Not on file  . Highest education level: Not on file  Occupational History  . Not on file  Social Needs  . Financial resource strain: Not on file  . Food insecurity:    Worry: Never true    Inability: Never true  . Transportation needs:    Medical: No    Non-medical: No  Tobacco Use  . Smoking status: Never Smoker  . Smokeless tobacco: Never Used  Substance and Sexual Activity  . Alcohol use: Not  on file  . Drug use: No    Comment: 08/15/08 UDS + cocaine  . Sexual activity: Not Currently  Lifestyle  . Physical activity:    Days per week: 0 days    Minutes per session: 0 min  . Stress: Not on file  Relationships  . Social connections:    Talks on phone: More than three times a week    Gets together: More than three times a week    Attends religious service: More than 4 times per year    Active member of club or organization: No    Attends meetings of clubs or organizations: Never    Relationship status: Widowed  Other Topics Concern  . Not on file  Social History Narrative   Member is widowed, has 1 daughter and 1 granddaughter and 3 great grandchildren      Additional Social History:    Allergies:   Allergies  Allergen Reactions  . Hydrocodone Nausea And Vomiting and Other (See Comments)    Dizziness, also  . Tape Other (See Comments)    TAPE LEAVES DARK PATCHES ON THE SKIN!!    Labs:  Results for orders placed or performed during the  hospital encounter of 07/03/18 (from the past 48 hour(s))  CBC     Status: Abnormal   Collection Time: 07/08/18  7:27 AM  Result Value Ref Range   WBC 4.7 4.0 - 10.5 K/uL   RBC 3.69 (L) 3.87 - 5.11 MIL/uL   Hemoglobin 10.2 (L) 12.0 - 15.0 g/dL   HCT 34.9 (L) 36.0 - 46.0 %   MCV 94.6 80.0 - 100.0 fL   MCH 27.6 26.0 - 34.0 pg   MCHC 29.2 (L) 30.0 - 36.0 g/dL   RDW 16.6 (H) 11.5 - 15.5 %   Platelets 121 (L) 150 - 400 K/uL   nRBC 0.0 0.0 - 0.2 %    Comment: Performed at [facility], [address] [name], [place] [address]    Current Facility-Administered Medications  Medication Dose Route Frequency Provider Last Rate Last Dose  . acetaminophen (TYLENOL) tablet 650 mg  650 mg Oral Q6H PRN Chundi, Vahini, MD   650 mg at 07/04/18 0507   Or  . acetaminophen (TYLENOL) suppository 650 mg  650 mg Rectal Q6H PRN Chundi, Vahini, MD      . aspirin EC tablet 81 mg  81 mg Oral Daily Chundi, Vahini, MD   81 mg at 07/07/18 6546  . calcitRIOL (ROCALTROL) capsule 2 mcg  2 mcg Oral Q M,W,F-HD Lars Mage, MD   2 mcg at 07/06/18 1512  . ceFEPIme (MAXIPIME) 2 g in sodium chloride 0.9 % 100 mL IVPB  2 g Intravenous Q M,W,F-HD Axel Filler, MD 200 mL/hr at 07/06/18 1618 2 g at 07/06/18 1618  . Chlorhexidine Gluconate Cloth 2 % PADS 6 each  6 each Topical Q0600 Alric Seton, PA-C      . Darbepoetin Alfa (ARANESP) injection 60 mcg  60 mcg Intravenous Q Mon-HD Alric Seton, PA-C   60 mcg at 07/06/18 1514  . diphenhydrAMINE-zinc acetate (BENADRYL) 2-0.1 % cream   Topical TID PRN Neva Seat, MD      . feeding supplement (NEPRO CARB STEADY) liquid 237 mL  237 mL Oral BID BM Alric Seton, PA-C   237 mL at 07/07/18 5035  . feeding supplement (PRO-STAT SUGAR FREE 64) liquid 30 mL  30 mL Oral BID Valentina Gu, NP   30 mL at 07/07/18 4656  .  ferric citrate (AURYXIA) tablet 420 mg  420 mg Oral TID WC Valentina Gu, NP   420 mg at 07/07/18 5284  . heparin injection  5,000 Units  5,000 Units Subcutaneous Q8H Lars Mage, MD   5,000 Units at 07/07/18 2113  . HYDROmorphone (DILAUDID) tablet 2 mg  2 mg Oral Q4H PRN Isabelle Course, MD   2 mg at 07/09/18 0536  . latanoprost (XALATAN) 0.005 % ophthalmic solution 1 drop  1 drop Both Eyes QHS Chundi, Vahini, MD   1 drop at 07/06/18 2243  . multivitamin (RENA-VIT) tablet 1 tablet  1 tablet Oral Nile Riggs, MD   1 tablet at 07/08/18 2211  . polyethylene glycol (MIRALAX / GLYCOLAX) packet 17 g  17 g Oral Daily Ina Homes, MD   17 g at 07/07/18 1324  . promethazine (PHENERGAN) tablet 12.5 mg  12.5 mg Oral Q6H PRN Chundi, Vahini, MD   12.5 mg at 07/06/18 1513  . senna-docusate (Senokot-S) tablet 1 tablet  1 tablet Oral BID Ina Homes, MD   1 tablet at 07/07/18 2113    Musculoskeletal: Strength & Muscle Tone: decreased Gait & Station: lying in the bed Patient leans: N/A  Psychiatric Specialty Exam: Physical Exam  ROS  Blood pressure 128/62, pulse 90, temperature 98 F (36.7 C), temperature source Oral, resp. rate 18, height 5\' 3"  (1.6 m), weight 41.8 kg, SpO2 98 %.Body mass index is 16.32 kg/m.  General Appearance: Disheveled  Eye Contact:  None  Speech:  Garbled  Volume:  Increased  Mood:  unable to answer  Affect:  irritable  Thought Process:  Disorganized  Orientation:  Other:  disoriented to time, place, self, situation  Thought Content:  Rumination  Suicidal Thoughts:  unable to answer due to her current mental status  Homicidal Thoughts:  unable to answer due to her current mental status  Memory:  unable to assess due to her current mental status  Judgement:  Impaired  Insight:  Lacking  Psychomotor Activity:  Increased  Concentration:  Concentration: Poor and Attention Span: Poor  Recall:  unable to assess due to her current mental status  Fund of Knowledge:  unable to assess due to her current mental status  Language:  unable to assess due to her current mental status   Akathisia:  NA  Handed:  Right  AIMS (if indicated):     Assets:  Social Support  ADL's:  Impaired  Cognition:  Impaired,  Moderate  Sleep:   poor   Head CT 04/2018 Brain: There is no mass, hemorrhage or extra-axial collection. The size and configuration of the ventricles and extra-axial CSF spaces are normal. There is an old left cerebellar infarct. There is periventricular hypo attenuation compatible with chronic microvascular disease.  Vascular: Atherosclerotic calcification of the internal carotid arteries at the skull base. No abnormal hyperdensity of the major intracranial arteries or dural venous sinuses.  Assessment Nancy B Million is a 78 y.o. female with ESRD due to hypertension, history of CVA, who is admitted for osteomyelitis with ventral epidural abscess. Psychiatry is consulted for capacity evaluation for proceeding with dialysis, and medical recommendation for agitation.    # Capacity evaluation Although the exam is very limited due to patient refusal to engage in the interview, she is disoriented, disorganized, and is unable to communicate a choice nor demonstrates understanding of her current medical condition and treatment. She lacks capacity with respect to her medical care including dialysis. Although it should be emphasized that capacity  may need to be re-assessed as the patient's mental status changes over time, the chance of the patient restoring capacity would likely be very slim given her medical condition and possible underlying neurocognitive disorder. I agree with the outpatient nephrologist, who is to discuss with her family members in regards to goals of care. Ethic consult may be considered if any disagreement among the family members and the team.  # Delirium # r/o neurocognitive disorder Patient is disoriented and demonstrates fluctuated alertness, which is consistent with delirium. Risks for delirium including stroke, prolonged hospitalizations,  anemia, insomnia/disturbed sleep, and severe pain. Although it is difficult to assess underlying neurocognitive disorder in the setting of delirium, she does have risk of dementia, which includes hypertension and history of CVA.  Will have low dose Zyprexa Zydis prn available given she does not have any line access and is not amenable to take oral medication. Would advise delirium precautions as follows.   Recommendation:  - Start Zyprexa Zydis 2.5 mg daily as needed for agitation.  - If the patient has sundowning episode, consider Zyprexa Zydis 2.5 mg at night (hold for drowsiness), and 2.5 mg daily prn for agitation.  - If any severe agitation, consider Haldol 1 mg IV bid prn for agitation (Please monitor QTc prolongation if this is to be used; hold haldol if QTc >500 msec) - May consider checking TSH, folate, vitamin B 12 to rule out treatable cause of dementia (if this is reasonable considering her goals of care)  - A substitute decision maker must be sought to authorize medical intervention or to refuse treatment on behalf of the patient; for patients with advance directives, either the treatment choice that the patient made in advance or the choice of a surrogate decision maker may be indicated. In the absence of an advance directive and when time is available, the recommendation is usually to contact family members (the priority order to be approached is the spouse, adult children, parents, siblings, and other relatives).  - Continue to monitor and treat underlying medical causes of delirium, including infection, electrolyte disturbances, etc. - Delirium precautions - Minimize/avoid deliriogenic meds including: anticholinergic, opiates, benzodiazepines           - Maintain hydration, oxygenation, nutrition           - Limit use of restraints and catheters           - Normalize sleep patterns by minimizing nighttime noise, light and interruptions by                clustering care, opening blinds  during the day           - Reorient the patient frequently, provide easily visible clock and calendar           - Provide sensory aids like glasses, hearing aids           - Encourage ambulation, regular activities and visitors to maintain cognitive stimulation   Thank you for your consult. We will sign off. Please contact psychiatry if any need for re-evaluation.    Treatment Plan Summary: Plan as above  Disposition: Patient does not meet criteria for psychiatric inpatient admission.  Norman Clay, MD 07/09/2018 1:13 PM

## 2018-07-09 NOTE — Progress Notes (Addendum)
[facility] Progress Note   Subjective: Yelling help at top of lungs. Confused, combative. Refusing meds, no IV. Was disruptive during HD yesterday.   Objective Vitals:   07/08/18 1530 07/08/18 1536 07/08/18 2140 07/09/18 0459  BP: (!) 81/36 (!) 82/37 (!) 120/56 128/62  Pulse: 96 92 95 90  Resp:  14 16 18   Temp:  97.8 F (36.6 C) 98 F (36.7 C) 98 F (36.7 C)  TempSrc:  Oral Oral Oral  SpO2:  100%  98%  Weight:      Height:       Physical Exam General: Chronically ill appearing elderly female confused and agitated Heart: unable to assess D/T combativeness Lungs: unable to assess D/T combativeness Abdomen:unable to assess D/T combativeness Extremities: No LE edema Dialysis Access: Left AVF - thickened dark skin over AVF + bruit   Additional Objective Labs: Basic Metabolic Panel: Recent Labs  Lab 07/03/18 1909 07/04/18 0359 07/05/18 0306 07/06/18 0612 07/07/18 0801  NA 139 139 138 139 138  K 3.5 4.1 3.9 3.6 3.7  CL 99 97* 98 101 100  CO2 29 27 25 25 24   GLUCOSE 66* 55* 71 65* 81  BUN 19 22 29* 9 7*  CREATININE 5.37* 5.64* 6.57* 3.60* 2.59*  CALCIUM 8.1* 8.7* 8.7* 8.7* 8.8*  PHOS 4.3 5.7* 6.4*  --   --    Liver Function Tests: Recent Labs  Lab 07/03/18 1909 07/04/18 0359 07/05/18 0306  AST  --  11*  --   ALT  --  7  --   ALKPHOS  --  58  --   BILITOT  --  0.9  --   PROT  --  6.3*  --   ALBUMIN 2.1* 2.3* 2.1*   No results for input(s): LIPASE, AMYLASE in the last 168 hours. CBC: Recent Labs  Lab 07/03/18 0736 07/04/18 0359 07/05/18 0306 07/06/18 0612 07/08/18 0727  WBC 4.4 2.6* 4.4 4.4 4.7  HGB 10.8* 11.9* 10.1* 10.5* 10.2*  HCT 37.3 41.3 35.4* 35.6* 34.9*  MCV 94.9 95.6 95.4 96.7 94.6  PLT 168 120* 127* 116* 121*   Blood Culture    Component Value Date/Time   SDES ABSCESS T12 07/04/2018 1626   SPECREQUEST NONE 07/04/2018 1626   CULT  07/04/2018 1626    RARE PSEUDOMONAS AERUGINOSA NO ANAEROBES ISOLATED; CULTURE IN  PROGRESS FOR 5 DAYS    REPTSTATUS PENDING 07/04/2018 1626    Cardiac Enzymes: No results for input(s): CKTOTAL, CKMB, CKMBINDEX, TROPONINI in the last 168 hours. CBG: No results for input(s): GLUCAP in the last 168 hours. Iron Studies: No results for input(s): IRON, TIBC, TRANSFERRIN, FERRITIN in the last 72 hours. @lablastinr3 @ Studies/Results: No results found. Medications: . ceFEPime (MAXIPIME) IV 2 g (07/06/18 1618)   . aspirin EC  81 mg Oral Daily  . calcitRIOL  2 mcg Oral Q M,W,F-HD  . Chlorhexidine Gluconate Cloth  6 each Topical Q0600  . darbepoetin (ARANESP) injection - DIALYSIS  60 mcg Intravenous Q Mon-HD  . feeding supplement (NEPRO CARB STEADY)  237 mL Oral BID BM  . feeding supplement (PRO-STAT SUGAR FREE 64)  30 mL Oral BID  . ferric citrate  420 mg Oral TID WC  . heparin  5,000 Units Subcutaneous Q8H  . latanoprost  1 drop Both Eyes QHS  . multivitamin  1 tablet Oral QHS  . polyethylene glycol  17 g Oral Daily  . senna-docusate  1 tablet Oral BID   Dialysis Orders: [facility] M[facility] 3.5 hrs  180NRe 400/Autoflow 1.5 45.5 kg 3.0 K/ 2.0 Ca  LUA AVF -No heparin -Mircera 225 mcg IV (Last dose 06/02/2018 Last HGB 10.2 06/22/18) -Calcitriol 0.25 mcg PO TIW (Last PTH 136 06/02/18 Last Phos 8.1 06/22/18)  Assessment/Plan: 1.s/p mech fall/obsteo with T12-L1 osteo with ventral epidural abscess-medical management per NS with 6 wk abtx - will be a challenge implementing this given outpatient noncompliance- prelim culture - rare pseudomonas- presently on Vanc and Maxipime (we can give fortaz at the outpt HD unit) .  Need to try dialysis in a recliner to see if she can tolerate this though it is difficult to assess since she is miserable and yells a lot at baseline.  Still,  SNF placement will require transportation and prolonged sitting which may not be realistic goal   2. ESRD- MWF-HD tomorrow on schedule. No heparin. K+ 3.7. Use 4.0 K bath   3. Anemia- hgb 10.2 - stable  -Aranesp at 60 q Monday 4. Secondary hyperparathyroidism- on auryxia - no change in dose for now- intact erratic and likely doesn't always coincide with binders 5.HTN/volume-HD 07/08/18. Net UF 1228. No wts done pre/post. UFG 1-2 liters tomorrow.   6.Malnutrition- continues to lose weight, alb low- has prostat - will liberalize diet to regular with fluid restriction and added nepro - had 820 cc intake Tuesday  7. Dementia - not sure patient is capable of  insight; 8.Goals of care - family still wishes full court press - she will not do well; QOL is poor and prognosis is the same.  9. Hx CVA/AS/mod MS- EF ok 10. Thrombocytopenia -stable 110 - 120s 11. Agitation/Psychosis-yelling,screaming, disturbing other patients on floor. Sees things that aren't present-says her finger is bleeding but no blood. Not a candidate for OP HD center until behavioral issues resolved. Refusing meds-consider Zyprexa SL-discussed with primary.   Christoph Copelan H. Justn Quale NP-C 07/09/2018, 8:39 AM  [employer] [phone]

## 2018-07-09 NOTE — Clinical Social Work Note (Signed)
Clinical Social Work Assessment  Patient Details  Name: Nancy [name] MRN: [id number] Date of Birth: 1941-06-29  Date of referral:  07/09/18               Reason for consult:  Discharge Planning, Facility Placement                Permission sought to share information with:  Family Supports Permission granted to share information::  Yes, Verbal Permission Granted  Name::  Nancy [name]     Agency::     Relationship:: Granddaughter   Contact Information: [phone]   Housing/Transportation Living arrangements for the past 2 months:  Single Family Home Source of Information:  Patient Patient Interpreter Needed:  None Criminal Activity/Legal Involvement Pertinent to Current Situation/Hospitalization:  No - Comment as needed Significant Relationships:  Granddaughter and daughter. Lives with: Brenton Grills and her mother (patient's daughter) Do you feel safe going back to the place where you live?  Need for family participation in patient care:  Yes (Comment)  Care giving concerns:  Granddaughter expressed agreement with ST rehab before patient returns home. At her request, SNF list emailed to [name].  Social Worker assessment / plan:  CSW talked with granddaughter, Nancy [name] by phone regarding discharge disposition and recommendation of ST rehab. [name] is in agreement with rehab for patient and reported that she had been looking on her on before this hospitalization for a SNF for patient for rehab. Per granddaughter, patient has been to SNF recently and they are interested in [place]. Granddaughter reported that her grandmother's dementia appears to be getting worse and she needs assistance with all ADL's (she can feed herself). Granddaughter added that her grandmother's appetite has not been good, however she will drink water and likes to suck on ice.   Employment status:  Retired Forensic scientist:  [employer], Warden/ranger) PT Recommendations:   Nancy [name] / Referral to community resources:  Skilled Nursing Facility(Patient provided with SNF list)  Patient/Family's Response to care:  [name] did not express any concerns regarding her grandmother's care during hospitalization.  Patient/Family's Understanding of and Emotional Response to Diagnosis, Current Treatment, and Prognosis:  Granddaughter expressed understanding of her grandmother's need for more care and rehab  Emotional Assessment Appearance:  Appears stated age Attitude/Demeanor/Rapport:  Engaged Affect (typically observed):  Appropriate, Pleasant Orientation:  Oriented to Self, Oriented to Situation(Patient was able to converse with CSW regarding her discharge plan) Alcohol / Substance use:  Tobacco Use, Alcohol Use, Illicit Drugs(Patient reported that she does not smoke, drink or use illicit drugs) Psych involvement (Current and /or in the community):  No (Comment)  Discharge Needs  Concerns to be addressed:  Discharge Planning Concerns Readmission within the last 30 days:  No Current discharge risk:  None Barriers to Discharge:  Continued Medical Work up, Ship broker, Other(Confirming that patient's facility chice is able to accept her)   Sable Feil, LCSW 07/09/2018, 4:24 PM

## 2018-07-10 MED ORDER — MIDODRINE HCL 5 MG PO TABS
2.5000 mg | ORAL_TABLET | Freq: Once | ORAL | Status: AC
  Administered 2018-07-10: 2.5 mg via ORAL
  Filled 2018-07-10: qty 1

## 2018-07-10 NOTE — Care Management Note (Signed)
Case Management Note Nancy Silvas, RN MSN CCM Transitions of Care 46M [place] [phone]  Patient Details  Name: Nancy [name] MRN: [id number] Date of Birth: [birth year]  Subjective/Objective:          Acute osteomyelitis          Action/Plan: PTA home with family. Transition recommendation for snf, however, patient has not been able to have dialysis in the chair while in the hospital. Nursing reports that patient cries in pain when the head of her bed is raised and refusing to get up in chair when offered d/t pain. Spoke with granddaughter, Nancy [name], to offer choice for LTACH. Nancy [name] chose [facility] and stated that other choice was not an option. [facility] unable to offer a bed at this time. Nancy [name] wants her grandmother to be up in chair stating that she has had this pain for 6 months. Nancy [name] states that she has been having to get patient up to chair herself and stay with patient throughout treatment, and she will continue to do this. Nancy [name] asked to come to hospital today to put her grandmother in the chair stating she doesn't want her grandmother to be bedridden. Nursing called HD dept for dialysis chair to be brought to room. Nancy [name] notified of situation. Spoke with nephrology PA-C, Nancy [name], about order for granddaughter to stay with patient during dialysis (like patient has when outpatient). Will continue to monitor for transition of care needs.   Expected Discharge Date:  07/14/18               Expected Discharge Plan:  [place]  In-House Referral:  Clinical Social Work  Discharge planning Services  CM Consult, Other - See comment(LTACH)  Post Acute Care Choice:  Long Term Acute Care (LTAC) Choice offered to:  Adult Children  DME Arranged:  N/A DME Agency:  NA  HH Arranged:  NA HH Agency:  NA  Status of Service:  In process, will continue to follow  If discussed at Long Length of Stay Meetings, dates discussed:    Additional Comments:  Nancy Crews, RN 07/10/2018, 11:38 AM

## 2018-07-10 NOTE — Progress Notes (Addendum)
[facility] Progress Note   Subjective: Seen in room. Remains confused. Wants to call daughter to pick her up.  Seen by psych. Not felt to have capacity for decision making.   Objective Vitals:   07/09/18 1719 07/09/18 2300 07/10/18 0701 07/10/18 0900  BP: (!) 130/48 (!) 121/57 (!) 116/45 (!) 103/43  Pulse: 92 94 85 89  Resp: 18 18 18 16   Temp:  97.7 F (36.5 C) (!) 97.4 F (36.3 C) 97.7 F (36.5 C)  TempSrc:  Oral  Oral  SpO2: 100% 100% 100% 100%  Weight:      Height:       Physical Exam General: Chronically ill appearing elderly female NAD  Heart: RRR Lungs: CTAB  Abdomen:soft NT/ND Extremities: No LE edema Dialysis Access: Left AVF - thickened dark skin over AVF + bruit   Additional Objective Labs: Basic Metabolic Panel: Recent Labs  Lab 07/03/18 1909 07/04/18 0359 07/05/18 0306 07/06/18 0612 07/07/18 0801  NA 139 139 138 139 138  K 3.5 4.1 3.9 3.6 3.7  CL 99 97* 98 101 100  CO2 29 27 25 25 24   GLUCOSE 66* 55* 71 65* 81  BUN 19 22 29* 9 7*  CREATININE 5.37* 5.64* 6.57* 3.60* 2.59*  CALCIUM 8.1* 8.7* 8.7* 8.7* 8.8*  PHOS 4.3 5.7* 6.4*  --   --    Liver Function Tests: Recent Labs  Lab 07/03/18 1909 07/04/18 0359 07/05/18 0306  AST  --  11*  --   ALT  --  7  --   ALKPHOS  --  58  --   BILITOT  --  0.9  --   PROT  --  6.3*  --   ALBUMIN 2.1* 2.3* 2.1*   No results for input(s): LIPASE, AMYLASE in the last 168 hours. CBC: Recent Labs  Lab 07/04/18 0359 07/05/18 0306 07/06/18 0612 07/08/18 0727  WBC 2.6* 4.4 4.4 4.7  HGB 11.9* 10.1* 10.5* 10.2*  HCT 41.3 35.4* 35.6* 34.9*  MCV 95.6 95.4 96.7 94.6  PLT 120* 127* 116* 121*   Blood Culture    Component Value Date/Time   SDES ABSCESS T12 07/04/2018 1626   SPECREQUEST NONE 07/04/2018 1626   CULT  07/04/2018 1626    RARE PSEUDOMONAS AERUGINOSA NO ANAEROBES ISOLATED Performed at [facility], [place] [address] [place], [place] [address]    REPTSTATUS 07/09/2018 FINAL  07/04/2018 1626    Cardiac Enzymes: No results for input(s): CKTOTAL, CKMB, CKMBINDEX, TROPONINI in the last 168 hours. CBG: No results for input(s): GLUCAP in the last 168 hours. Iron Studies: No results for input(s): IRON, TIBC, TRANSFERRIN, FERRITIN in the last 72 hours. @lablastinr3 @ Studies/Results: No results found. Medications: . ceFEPime (MAXIPIME) IV 2 g (07/06/18 1618)   . aspirin EC  81 mg Oral Daily  . calcitRIOL  2 mcg Oral Q M,W,F-HD  . Chlorhexidine Gluconate Cloth  6 each Topical Q0600  . darbepoetin (ARANESP) injection - DIALYSIS  60 mcg Intravenous Q Mon-HD  . feeding supplement (NEPRO CARB STEADY)  237 mL Oral BID BM  . feeding supplement (PRO-STAT SUGAR FREE 64)  30 mL Oral BID  . ferric citrate  420 mg Oral TID WC  . heparin  5,000 Units Subcutaneous Q8H  . latanoprost  1 drop Both Eyes QHS  . multivitamin  1 tablet Oral QHS  . polyethylene glycol  17 g Oral Daily  . senna-docusate  1 tablet Oral BID   Dialysis Orders: [facility] MWF 3.5 hrs  180NRe 400/Autoflow 1.5 45.5 kg 3.0 K/ 2.0 Ca  LUA AVF -No heparin -Mircera 225 mcg IV (Last dose 06/02/2018 Last HGB 10.2 06/22/18) -Calcitriol 0.25 mcg PO TIW (Last PTH 136 06/02/18 Last Phos 8.1 06/22/18)  Assessment/Plan: 1.s/p mech fall/osteo with T12-L1 osteo with ventral epidural abscess-medical management per NS with 6 wk abtx - will be a challenge implementing this given outpatient noncompliance- culture+  rare pseudomonas- On Maxipime (we can give fortaz at the outpt HD unit) .  Need to try dialysis in a recliner to see if she can tolerate this though it is difficult to assess since she is miserable and yells a lot at baseline. At this point does not seem to be a candidate for outpatient dialysis.  2. ESRD- MWF-HD. HD today on schedule.  No heparin.  3. Anemia- hgb 10.2 - stable -Aranesp at 60 q Monday 4. Secondary hyperparathyroidism- on auryxia -  5.HTN/volume-BP/volume stable. HD 07/08/18. Net UF 1228.   6.Malnutrition/FTT- continues to lose weight, alb low- has prostat - will liberalize diet to regular with fluid restriction and added nepro  7. Dementia/Agitation/Delirium - Psych eval 07/09/18-  Lacks capacity regarding medical decisions. Zyprexa added prn for agitation.  9. Hx CVA/AS/mod MS- EF ok 10. Thrombocytopenia -stable  Dispo: Poor prognosis/Poor QOL. Palliative care consulted to determine [place] with family.    Lynnda Child PA-C [facility] Pager [phone] 07/10/2018,10:20 AM

## 2018-07-10 NOTE — Care Management Note (Signed)
Case Management Note [name], RN MSN CCM Transitions of Care [address] [place] [phone]  Patient Details  Name: Nancy [name] MRN: [id number] Date of Birth: 1940/08/28  Subjective/Objective:          Acute osteomyelitis          Action/Plan: PTA home with family. Transition recommendation for snf, however, patient has not been able to have dialysis in the chair while in the hospital. Nursing reports that patient cries in pain when the head of her bed is raised and refusing to get up in chair when offered d/t pain. Spoke with granddaughter, Nancy [name], to offer choice for LTACH. Nancy [name] chose [facility] and stated that other choice was not an option. [facility] to offer a bed at this time. Nancy [name] wants her grandmother to be up in chair stating that she has had this pain for 6 months. Nancy [name] states that she has been having to get patient up to chair herself and stay with patient throughout treatment, and she will continue to do this. Nancy [name] asked to come to hospital today to put her grandmother in the chair stating she doesn't want her grandmother to be bedridden. Nursing called HD dept for dialysis chair to be brought to room. Dr. Annie Paras notified of situation. Spoke with nephrology PA-C, Shirlee Limerick, about order for granddaughter to stay with patient during dialysis (like patient has when outpatient). Will continue to monitor for transition of care needs.   Expected Discharge Date:  07/14/18               Expected Discharge Plan:  [place]  In-House Referral:  Clinical Social Work  Discharge planning Services  CM Consult, Other - See comment(LTACH)  Post Acute Care Choice:  Long Term Acute Care (LTAC) Choice offered to:  Adult Children  DME Arranged:  N/A DME Agency:  NA  HH Arranged:  NA HH Agency:  NA  Status of Service:  In process, will continue to follow  If discussed at Long Length of Stay Meetings, dates discussed:    Additional Comments: 07/10/18 [place], RN MSN CCM Pt's granddaughter, Nancy [name], assisted patient out of bed and into shower. Patient taken to hemodialysis in recliner. Nancy [name] at chairside for patient comfort. Noted CSW following for snf placement-pending authorization for [facility]  Will continue to follow for transition of care needs.   Bartholomew Crews, RN 07/10/2018, 4:26 PM

## 2018-07-10 NOTE — Progress Notes (Signed)
Patient returned from HD, BP was 82/38 (she is refusing IV access) and she complained of severe back pain. MD paged, received verbal order to hold dilaudid due to soft BP, waiting for further orders. Will continue to monitor

## 2018-07-10 NOTE — Progress Notes (Signed)
With her families assistance patient was able to ambulate with a walker from her bed to the bathroom and back. She also tolerated approximately 4 hours in the recliner during HD treatment.

## 2018-07-10 NOTE — Progress Notes (Signed)
   Subjective:   No overnight events. Patient was complaining of back pain. She wanted to know when her surgery would be, and she was informed there was no plan to proceed with surgery at this time. She stated she was "tired of hurting." She ate some of her breakfast.   Objective:  Vital signs in last 24 hours: Vitals:   07/08/18 2140 07/09/18 0459 07/09/18 1719 07/09/18 2300  BP: (!) 120/56 128/62 (!) 130/48 (!) 121/57  Pulse: 95 90 92 94  Resp: 16 18 18 18   Temp: 98 F (36.7 C) 98 F (36.7 C)  97.7 F (36.5 C)  TempSrc: Oral Oral  Oral  SpO2:  98% 100% 100%  Weight:      Height:       Physical Exam Gen:Thin and frail. No distress. Neuro: Moves all four extremities spontaneously. Answers questions appropriately and follows commands. Cardiovascular:Systolic and diastolic murmur. Pulmonary/Chest:Effort normal. Clear to auscultation bilaterally. No wheezes, rales, or rhonchi. Ext:No lower extremity edema. Skin: Warm and dry.  Assessment/Plan:  Principal Problem:   Acute osteomyelitis of thoracic spine (HCC) Active Problems:   ESRD (end stage renal disease) ([place])   Epidural abscess   Psychomotor agitation  Nancy [name] is a 65 yoF with ESRD on HD MWF, CVA, and HTN presenting with right sided trunk/back pain after a fall, found to have osteomylitis/discitis of T12-L1 with ventral epidural abscess  Osteomyelitis with ventral epidural abscess - Stable. Afebrile.Currently getting cefepime with HD. Neurosurg recommends repeat MRI prior to discontinuing antibiotics to make sure epidural abscess is improving. Plan - Continue cefepime with HD for a 6-8 week course.  - pain control: PO dilaudid 2mg  q4hrs PRN  ESRD on HD (MWF) - Last dialyzed 1/1 - HD with cefepime today - Concern that the patient will not tolerate outpatient dialysis. Palliative care has been consulted to speak with the family about whether continuing dialysis is in the patient's best interests. Her  granddaughter is healthcare power of attorney and the patient was found to not have capacity by psychiatry on 1/2. - Case management has also been consulted as patient is felt to be more suitable for LTAC on discharge rather than SNF. Plan - HD with cefepime today - Palliative care consulted, appreciate recs - Case management consulted for LTAC placement  Dementia and Agitation - During her hospitalization, she has been refusing her medications and has had episodes of shouting and agitation.  - Found to not have capacity by psychiatry on 1/2 Plan - Zyprexa 2.5mg  PRN for agitation.  Dispo: Anticipated discharge pending LTAC placement  Nancy [name], [name], MD 07/10/2018, 6:42 AM Pager: [phone]

## 2018-07-10 NOTE — Progress Notes (Signed)
Medicine attending: I examined this patient today together with resident physician Dr Vilma Prader and I concur with her evaluation and management plan. Still having considerable back pain which is not unexpected.  We tried to explain to her that she has an infection in her spine bone and it will take a long time to heal.  We will adjust her pain medications as needed to keep her comfortable.  She remains afebrile on a planned prolonged 6-8-week course of cephapirin for Pseudomonas osteomyelitis/discitis. We greatly appreciate the thorough and cogent note by psychiatry!  We will follow recommendations. Nephrology to intervene with the family with regard to whether or not she is a candidate to continue long-term dialysis.

## 2018-07-10 NOTE — Clinical Social Work Placement (Signed)
   CLINICAL SOCIAL WORK PLACEMENT  NOTE 07/10/18: [facility] INITIATED AUTHORIZATION WITH [employer]HC MEDICARE  Date:  07/10/2018  Patient Details  Name: Nancy [name] MRN: [id number] Date of Birth: Apr 13, 1941  Clinical Social Work is seeking post-discharge placement for this patient at the [place] level of care (*CSW will initial, date and re-position this form in  chart as items are completed):  Yes(Emailed to granddaughter [name] at: [email or web address] )   Patient/family provided with [facility] Work Department's list of facilities offering this level of care within the geographic area requested by the patient (or if unable, by the patient's family).  Yes   Patient/family informed of their freedom to choose among providers that offer the needed level of care, that participate in Medicare, Medicaid or managed care program needed by the patient, have an available bed and are willing to accept the patient.  Yes   Patient/family informed of [facility]'s ownership interest in [facility] and [facility], as well as of the fact that they are under no obligation to receive care at these facilities.  PASRR submitted to EDS on 07/07/18     PASRR number received on 07/07/18     Existing PASRR number confirmed on       FL2 transmitted to all facilities in geographic area requested by pt/family on 07/09/18     FL2 transmitted to all facilities within larger geographic area on       Patient informed that his/her managed care company has contracts with or will negotiate with certain facilities, including the following:        Yes   Patient/family informed of bed offers received.  Patient chooses bed at [name]     Physician recommends and patient chooses bed at      Patient to be transferred to [facility] on  .  Patient to be transferred to facility by       Patient family notified on   of transfer.  Name of family member notified:         PHYSICIAN      Additional Comment:  07/10/18: 14/3/20 [facility] has initiated authorization with [facility] Medicare..   _______________________________________________ [facility], LCSW 07/10/2018, 3:55 PM

## 2018-07-10 NOTE — Procedures (Signed)
I was present at this dialysis session. I have reviewed the session itself and made appropriate changes.   In chair. Cut time to 3h.  Cannulated ok.   Filed Weights   07/05/18 0648 07/06/18 1400 07/06/18 1654  Weight: 43 kg 42.5 kg 41.8 kg    Recent Labs  Lab 07/05/18 0306  07/07/18 0801  NA 138   < > 138  K 3.9   < > 3.7  CL 98   < > 100  CO2 25   < > 24  GLUCOSE 71   < > 81  BUN 29*   < > 7*  CREATININE 6.57*   < > 2.59*  CALCIUM 8.7*   < > 8.8*  PHOS 6.4*  --   --    < > = values in this interval not displayed.    Recent Labs  Lab 07/05/18 0306 07/06/18 0612 07/08/18 0727  WBC 4.4 4.4 4.7  HGB 10.1* 10.5* 10.2*  HCT 35.4* 35.6* 34.9*  MCV 95.4 96.7 94.6  PLT 127* 116* 121*    Scheduled Meds: . aspirin EC  81 mg Oral Daily  . calcitRIOL  2 mcg Oral Q M,W,F-HD  . Chlorhexidine Gluconate Cloth  6 each Topical Q0600  . darbepoetin (ARANESP) injection - DIALYSIS  60 mcg Intravenous Q Mon-HD  . feeding supplement (NEPRO CARB STEADY)  237 mL Oral BID BM  . feeding supplement (PRO-STAT SUGAR FREE 64)  30 mL Oral BID  . ferric citrate  420 mg Oral TID WC  . heparin  5,000 Units Subcutaneous Q8H  . latanoprost  1 drop Both Eyes QHS  . multivitamin  1 tablet Oral QHS  . polyethylene glycol  17 g Oral Daily  . senna-docusate  1 tablet Oral BID   Continuous Infusions: . ceFEPime (MAXIPIME) IV 2 g (07/06/18 1618)   PRN Meds:.acetaminophen **OR** acetaminophen, diphenhydrAMINE-zinc acetate, HYDROmorphone, OLANZapine zydis, promethazine   Pearson Grippe  MD 07/10/2018, 1:28 PM

## 2018-07-11 DIAGNOSIS — Z515 Encounter for palliative care: Secondary | ICD-10-CM

## 2018-07-11 MED ORDER — HYDROMORPHONE HCL 2 MG PO TABS
2.0000 mg | ORAL_TABLET | ORAL | Status: DC | PRN
  Administered 2018-07-11 – 2018-07-28 (×26): 2 mg via ORAL
  Filled 2018-07-11 (×28): qty 1

## 2018-07-11 NOTE — Progress Notes (Signed)
   Subjective: Patient initially resting comfortably. She states that she is feeling okay but continues to have back pain. Is asking for pain medications. She is wondering how long it will take before this pain resolves. We discussed that she will need several weeks of antibiotics but we will restart her pain medications. She does not have any questions this AM.   Objective: Vital signs in last 24 hours: Vitals:   07/10/18 1625 07/10/18 1704 07/10/18 2054 07/11/18 0550  BP: (!) 98/25 (!) 82/38 (!) 119/43 (!) 121/58  Pulse: 87 87 92 88  Resp: 14 20 18 18   Temp: (!) 97.4 F (36.3 C) (!) 97.3 F (36.3 C) 97.6 F (36.4 C) 97.8 F (36.6 C)  TempSrc: Oral Oral  Oral  SpO2: 96%  100% 100%  Weight:      Height:       General: Thin elderly female in no acute distress CV: RRR, systolic murmur   Neuro: Alert and oriented x 3, spontaneously moves all extremities   Assessment/Plan:  Nancy [name] is a 10 yoF with ESRD on HD MWF, CVA, and HTN presenting with right sided trunk/back pain after a fall, found to have osteomylitis/discitis of T12-L1 with ventral epidural abscess. She was subsequently admitted for further evaluation and management.   Osteomyelitis with ventral epidural abscess -Stable. Afebrile. -Continue cefepime with HD for a 6-8 week course.  - Repeat MRI prior to discontinuation of antibiotic therapy per neurosurgery  - PO dilaudid on hold due to hypotension after HD. BP is improving. Will schedule tylenol and restart dilaudid.  ESRD on HD (MWF) - Tolerated >3 hours on HD 01/03  - Nephrology and palliative care continuing to have [place] discussion with family .   Dementia and Agitation - During her hospitalization, she has been refusing her medications and has had episodes of shouting and agitation.  - Found to not have capacity by psychiatry on 1/2. Appreciate recommendation  - EKG to assess QTc. Zyprexa 2.5mg  PRN for agitation.  Dispo: Awaiting placement at SNF vs LTAC.  Nephrology to determine if she is a candidate for outpatient HD.   Nancy Homes, MD 07/11/2018, 7:54 AM

## 2018-07-11 NOTE — Progress Notes (Signed)
Patient seen, chart reviewed.  Patient was unable to participate in goals of care discussion this morning.  Patient has been seen by palliative medicine providers going back to 2018 and most recently November 2019.  I did reach her daughter, [name], and we have scheduled a meeting for 07/12/2018 3 PM. Thank you for the consult, Romona Curls, ANP Palliative medicine team

## 2018-07-11 NOTE — Progress Notes (Signed)
Pharmacy Antibiotic Note  Nancy [name] is a 78 y.o. female admitted on 07/03/2018 with back pain.  Pharmacy consulted for Cefepime for Osteomyelitis. ESRD - HD MWF  Patient remains afebrile and WBC remains normal. Of note, patient missed cefepime dose on 1/1 and continues to require redirection during HD in order to remain on HD. Last HD session was on 1/3 and patient tolerated 3 hours with BFR @ 300.  Plan: Continue Cefepime 2g IV post-HD MWF (6-8 weeks) Monitor HD schedule/toleration and clinical status.   Height: 5\' 3"  (160 cm) Weight: (pt in HD chair) IBW/kg (Calculated) : 52.4  Temp (24hrs), Avg:97.6 F (36.4 C), Min:97.3 F (36.3 C), Max:97.8 F (36.6 C)  Recent Labs  Lab 07/05/18 0306 07/06/18 0612 07/07/18 0801 07/08/18 0727  WBC 4.4 4.4  --  4.7  CREATININE 6.57* 3.60* 2.59*  --     Estimated Creatinine Clearance: 12.4 mL/min (A) (by C-G formula based on SCr of 2.59 mg/dL (H)).    Allergies  Allergen Reactions  . Hydrocodone Nausea And Vomiting and Other (See Comments)    Dizziness, also  . Tape Other (See Comments)    TAPE LEAVES DARK PATCHES ON THE SKIN!!   Antimicrobials this admission:  12/28 cefepime>> (6-8 weeks) 12/28 vancomycin >> 12/29  Dose adjustments this admission:   Microbiology results:  12/27 BCx: ngtd 12/27 MRSA pcr negative 12/28 T12 Abscess: Pseudomonas Aeruginosa, pan S   Thank you for allowing [place] to participate in this patients care.   Jackson Latino, PharmD PGY1 Pharmacy Resident Phone [phone] 07/11/2018     11:01 AM

## 2018-07-11 NOTE — Progress Notes (Signed)
[facility] Progress Note   Subjective:  Had 3hours HD in recliner yesterday. Needs a lot of redirection/attention during treatments   Objective Vitals:   07/10/18 1704 07/10/18 2054 07/11/18 0550 07/11/18 0904  BP: (!) 82/38 (!) 119/43 (!) 121/58 (!) 111/48  Pulse: 87 92 88 90  Resp: 20 18 18 17   Temp: (!) 97.3 F (36.3 C) 97.6 F (36.4 C) 97.8 F (36.6 C) 97.7 F (36.5 C)  TempSrc: Oral  Oral Oral  SpO2:  100% 100% 100%  Weight:      Height:       Physical Exam General: Chronically ill appearing elderly female NAD  Heart: RRR Lungs: CTAB  Abdomen:soft NT/ND Extremities: No LE edema Dialysis Access: Left AVF - thickened dark skin over AVF + bruit   Additional Objective Labs: Basic Metabolic Panel: Recent Labs  Lab 07/05/18 0306 07/06/18 0612 07/07/18 0801  NA 138 139 138  K 3.9 3.6 3.7  CL 98 101 100  CO2 25 25 24   GLUCOSE 71 65* 81  BUN 29* 9 7*  CREATININE 6.57* 3.60* 2.59*  CALCIUM 8.7* 8.7* 8.8*  PHOS 6.4*  --   --    Liver Function Tests: Recent Labs  Lab 07/05/18 0306  ALBUMIN 2.1*   No results for input(s): LIPASE, AMYLASE in the last 168 hours. CBC: Recent Labs  Lab 07/05/18 0306 07/06/18 0612 07/08/18 0727  WBC 4.4 4.4 4.7  HGB 10.1* 10.5* 10.2*  HCT 35.4* 35.6* 34.9*  MCV 95.4 96.7 94.6  PLT 127* 116* 121*   Blood Culture    Component Value Date/Time   SDES ABSCESS T12 07/04/2018 1626   SPECREQUEST NONE 07/04/2018 1626   CULT  07/04/2018 1626    RARE PSEUDOMONAS AERUGINOSA NO ANAEROBES ISOLATED Performed at [facility], [place] [address], [place], [place] [address]    REPTSTATUS 07/09/2018 FINAL 07/04/2018 1626    Cardiac Enzymes: No results for input(s): CKTOTAL, CKMB, CKMBINDEX, TROPONINI in the last 168 hours. CBG: No results for input(s): GLUCAP in the last 168 hours. Iron Studies: No results for input(s): IRON, TIBC, TRANSFERRIN, FERRITIN in the last 72 hours. @lablastinr3 @ Studies/Results: No  results found. Medications: . ceFEPime (MAXIPIME) IV 2 g (07/10/18 1526)   . aspirin EC  81 mg Oral Daily  . calcitRIOL  2 mcg Oral Q M,W,F-HD  . Chlorhexidine Gluconate Cloth  6 each Topical Q0600  . darbepoetin (ARANESP) injection - DIALYSIS  60 mcg Intravenous Q Mon-HD  . feeding supplement (NEPRO CARB STEADY)  237 mL Oral BID BM  . feeding supplement (PRO-STAT SUGAR FREE 64)  30 mL Oral BID  . ferric citrate  420 mg Oral TID WC  . heparin  5,000 Units Subcutaneous Q8H  . latanoprost  1 drop Both Eyes QHS  . multivitamin  1 tablet Oral QHS  . polyethylene glycol  17 g Oral Daily  . senna-docusate  1 tablet Oral BID   Dialysis Orders: [facility] M[facility] 3.5 hrs 180NRe 400/Autoflow 1.5 45.5 kg 3.0 K/ 2.0 Ca  LUA AVF -No heparin -Mircera 225 mcg IV (Last dose 06/02/2018 Last HGB 10.2 06/22/18) -Calcitriol 0.25 mcg PO TIW (Last PTH 136 06/02/18 Last Phos 8.1 06/22/18)  Assessment/Plan: 1.s/p mech fall/osteo with T12-L1 osteo with ventral epidural abscess-medical management per NS with 6 wk abtx - will be a challenge implementing this given outpatient noncompliance- culture+  rare pseudomonas- On Maxipime (we can give fortaz at the outpt HD unit).  2. ESRD- MWF-HD. Next HD  1/6 No heparin. Sitting in recliner for dialysis but remains too disruptive and requires redirection to remain on dialysis. At this point is not a candidate for outpatient dialysis.  3. Anemia- hgb 10.2 - stable -Aranesp at 60 q Monday 4. Secondary hyperparathyroidism- on auryxia -  5.HTN/volume-BP/volume stable. Minimal UF on HD  6.Malnutrition/FTT- continues to lose weight, alb low- has prostat - will liberalize diet to regular with fluid restriction and added nepro  7. Dementia/Agitation/Delirium - Psych eval 07/09/18-  Lacks capacity regarding medical decisions. Zyprexa added prn for agitation.  9. Hx CVA/AS/mod MS- EF ok 10. Thrombocytopenia -stable  Dispo: Poor prognosis/Poor QOL. Palliative care consulted to  determine [place] with family. CM working on placement.   Lynnda Child PA-C [facility] Pager [phone] 07/11/2018,9:47 AM

## 2018-07-11 NOTE — Consult Note (Signed)
Brief psychiatry consult note.   Addendum made to the original consul note on 1/2. Please consider monitoring QTc prolongation when antipsychotics were to be used. (Last QTc 521 msec on 10/17.) It is generally advised to hold antipsychotics when QTc > 500 msec to avoid risk of [place] Pointes, although there may be some exception for severe agitation which can impede her medical care. Noted that olanzapine has less risk of QTc prolongation.

## 2018-07-12 DIAGNOSIS — F0391 Unspecified dementia with behavioral disturbance: Secondary | ICD-10-CM

## 2018-07-12 DIAGNOSIS — Z7189 Other specified counseling: Secondary | ICD-10-CM

## 2018-07-12 MED ORDER — HYDROMORPHONE HCL 1 MG/ML IJ SOLN: 0.5000 mg | Freq: Once | INTRAMUSCULAR | Status: DC

## 2018-07-12 NOTE — Progress Notes (Signed)
   Subjective: Patient back pain today. She is asking to be readjusted and would like to be readjusted every morning. She did eat a little bit of her breakfast this morning. Otherwise does not have any complaints. We discussed continuing to treat her pain and continuing antibiotics to treat the infection in her back. She voices understanding.  Objective: Vital signs in last 24 hours: Vitals:   07/11/18 2045 07/12/18 0435 07/12/18 0500 07/12/18 0942  BP: (!) 115/48 (!) 110/50  (!) 109/57  Pulse: 86 85  90  Resp: 14 18  17   Temp: (!) 97.5 F (36.4 C) (!) 97.5 F (36.4 C)  (!) 97.5 F (36.4 C)  TempSrc: Oral Oral  Oral  SpO2: 100% 100%  100%  Weight: 37.6 kg  37.6 kg   Height:       General: Thin female in no acute distress Neuro: Alert and oriented x 3, spontaneously moving all extremities  Assessment/Plan:  Ms. Shaft is a62 yoF with ESRD on HD MWF, CVA,and HTN presenting with right sided trunk/back pain after a fall, found to have osteomylitis/discitis of T12-L1 with ventral epidural abscess. She was subsequently admitted for further evaluation and management.   Osteomyelitis with ventral epidural abscess -Stable. Afebrile. -Continue cefepime with HD for a 6-8 week course.  - Repeat MRI prior to discontinuation of antibiotic therapy per neurosurgery  - Continue scheduled tylenol and PO dilaudid.  ESRD on HD (MWF) - Tolerated >3 hours on HD 01/03  - Not a candidate for outpatient dialysis per nephrology unless she goes to an [facility] - Nephrology and palliative care continuing to have [place] discussion with family.   Dementia and Agitation - During her hospitalization, she has been refusing her medications and has had episodes of shouting and agitation.  -Found to not have capacity by psychiatry on 1/2. Appreciate recommendation  -QTc 460. Zyprexa 2.5mg  PRN for agitation.  Dispo: Awaiting placement at SNF vs LTAC. Nephrology has determined that she is not a candidate for  outpatient dialysis. Palliative care to have a family meeting on 1/5 at 3 PM.  Ina Homes, MD 07/12/2018, 10:52 AM

## 2018-07-12 NOTE — Progress Notes (Signed)
  Date: 07/12/2018  Patient name: Nancy [name]  Medical record number: [id number]  Date of birth: Aug 20, 1940   I have seen and evaluated this patient and I have discussed the plan of care with the house staff. Please see their note for complete details. I concur with their findings with the following additions/corrections: [name] was seen this morning on team rounds.  We discussed her care with Dr. Joelyn Oms of nephrology.  When a family member is with her in inpatient hemodialysis, her behavior is not an issue.  A family member is not always present and her behavior is not conducive to outpatient hemodialysis as the centers do not have the staff to be at her bedside constantly.  They are continuing palliative care meetings as the options are limited as the family wants to continue hemodialysis currently.  Bartholomew Crews, MD 07/12/2018, 1:50 PM

## 2018-07-12 NOTE — Progress Notes (Addendum)
Update: Received a text message back from [facility], we still do not have insurance authorization.    ______________________   CSW called and left a voicemail for [place], [place] Admissions Director,  asking about insurance authorization for the patient.   CSW is awaiting a return phone call.   Domenic Schwab, MSW, [place]

## 2018-07-12 NOTE — Progress Notes (Signed)
Pt yelling out in pain. Repositioned pt, rubbed back, and put on heating pad. Pt states back is still hurting at 10/10. Too soon for Dilaudid, messaged MD on call.   Eleanora Neighbor, RN

## 2018-07-12 NOTE — Progress Notes (Signed)
[facility] Progress Note   Subjective:  No new events. Palliative meeting Monday.   Objective Vitals:   07/11/18 1744 07/11/18 2045 07/12/18 0435 07/12/18 0500  BP: (!) 126/54 (!) 115/48 (!) 110/50   Pulse: 90 86 85   Resp: 16 14 18    Temp: 97.6 F (36.4 C) (!) 97.5 F (36.4 C) (!) 97.5 F (36.4 C)   TempSrc: Oral Oral Oral   SpO2: 100% 100% 100%   Weight:  37.6 kg  37.6 kg  Height:       Physical Exam General: Chronically ill appearing elderly female NAD  Heart: RRR Lungs: CTAB  Abdomen:soft NT/ND Extremities: No LE edema Dialysis Access: Left AVF - thickened dark skin over AVF + bruit   Additional Objective Labs: Basic Metabolic Panel: Recent Labs  Lab 07/06/18 0612 07/07/18 0801  NA 139 138  K 3.6 3.7  CL 101 100  CO2 25 24  GLUCOSE 65* 81  BUN 9 7*  CREATININE 3.60* 2.59*  CALCIUM 8.7* 8.8*   Liver Function Tests: No results for input(s): AST, ALT, ALKPHOS, BILITOT, PROT, ALBUMIN in the last 168 hours. No results for input(s): LIPASE, AMYLASE in the last 168 hours. CBC: Recent Labs  Lab 07/06/18 0612 07/08/18 0727  WBC 4.4 4.7  HGB 10.5* 10.2*  HCT 35.6* 34.9*  MCV 96.7 94.6  PLT 116* 121*   Blood Culture    Component Value Date/Time   SDES ABSCESS T12 07/04/2018 1626   SPECREQUEST NONE 07/04/2018 1626   CULT  07/04/2018 1626    RARE PSEUDOMONAS AERUGINOSA NO ANAEROBES ISOLATED Performed at [facility], [place] [address] [place], [place] [address]    REPTSTATUS 07/09/2018 FINAL 07/04/2018 1626    Cardiac Enzymes: No results for input(s): CKTOTAL, CKMB, CKMBINDEX, TROPONINI in the last 168 hours. CBG: No results for input(s): GLUCAP in the last 168 hours. Iron Studies: No results for input(s): IRON, TIBC, TRANSFERRIN, FERRITIN in the last 72 hours. @lablastinr3 @ Studies/Results: No results found. Medications: . ceFEPime (MAXIPIME) IV 2 g (07/10/18 1526)   . aspirin EC  81 mg Oral Daily  . calcitRIOL  2 mcg  Oral Q M,W,F-HD  . Chlorhexidine Gluconate Cloth  6 each Topical Q0600  . darbepoetin (ARANESP) injection - DIALYSIS  60 mcg Intravenous Q Mon-HD  . feeding supplement (NEPRO CARB STEADY)  237 mL Oral BID BM  . feeding supplement (PRO-STAT SUGAR FREE 64)  30 mL Oral BID  . ferric citrate  420 mg Oral TID WC  . heparin  5,000 Units Subcutaneous Q8H  . latanoprost  1 drop Both Eyes QHS  . multivitamin  1 tablet Oral QHS  . polyethylene glycol  17 g Oral Daily  . senna-docusate  1 tablet Oral BID   Dialysis Orders: [facility] MWF 3.5 hrs 180NRe 400/Autoflow 1.5 45.5 kg 3.0 K/ 2.0 Ca  LUA AVF -No heparin -Mircera 225 mcg IV (Last dose 06/02/2018 Last HGB 10.2 06/22/18) -Calcitriol 0.25 mcg PO TIW (Last PTH 136 06/02/18 Last Phos 8.1 06/22/18)  Assessment/Plan: 1.s/p mech fall/osteo with T12-L1 osteo with ventral epidural abscess-medical management per NS with 6 wk abtx - will be a challenge implementing this given outpatient noncompliance- culture+  rare pseudomonas- On cefepime.  2. ESRD- MWF-HD. Next HD 1/6. 3 hours. Check labs.  Sitting in recliner for dialysis but remains too disruptive and requires redirection to remain on dialysis. At this point is not a candidate for outpatient dialysis.  3. Anemia- hgb 10.2 - stable -  Aranesp at 60 q Monday 4. Secondary hyperparathyroidism- on auryxia -  5.HTN/volume-BP/volume stable. Minimal UF on HD  6.Malnutrition/FTT- continues to lose weight, alb low- has prostat - liberalized diet with fluid restriction and added nepro  7. Dementia/Agitation/Delirium - Psych eval 1/2  Lacks capacity regarding medical decisions. Zyprexa added prn for agitation.  9. Hx CVA/AS/mod MS- EF ok 10. Thrombocytopenia -stable  Dispo: Poor prognosis/Poor QOL. Palliative care consulted to determine [place] with family. CM working on placement.   Lynnda Child PA-C [facility] Pager [phone] 07/12/2018,9:00 AM

## 2018-07-12 NOTE — Consult Note (Signed)
Consultation Note Date: 07/12/2018   Patient Name: Nancy [name]  DOB: 14-Sep-1940  MRN: [id number]  Age / Sex: 78 y.o., female  PCP: Nancy Contes, MD Referring Physician: Bartholomew Crews, MD  Reason for Consultation: Establishing goals of care and Psychosocial/spiritual support  HPI/Patient Profile: 78 y.o. female  with past medical history of end-stage renal disease on hemodialysis for approximately 1 year, CVA in 2009, CHF, history of colitis, chronic pain, questionable renal cell carcinoma to right kidney admitted on 07/03/2018 with acute back pain.  Patient was found to have an epidural abscess at T11-T12 level and has been started on antibiotics for acute osteo-of the spine.  Neurosurgery has been consulted and recommended medical management given patient's frailty and underlying comorbidities.  She has had extreme weight loss over 100 pounds.  Her albumin is only 2.1.  Patient has been seen by psychiatry on 07/09/2018 and deemed not to have capacity.  Her last hemodialysis was on 07/10/2018.  She required one-to-one sitter in order to tolerate procedure.  Per chart review, nephrology states she can no longer return to the dialysis center unless she has one-to-one sitter with her.  Consult ordered for goals of care.  The palliative medicine team has seen Nancy [name] in 2018 as well as on 2 occasions [name] 2019 and November 2019.Marland Kitchen   Clinical Assessment and Goals of Care: Patient seen, chart reviewed.  Met with patient's only daughter, Nancy [name].  We had a lengthy discussion about patient's challenges continuing hemodialysis in the setting of rapidly advancing dementia.  I told Nancy [name] that even if she did not have end-stage renal disease, her dementia was of the severity in conjunction with her associated weight loss, failure to thrive picture, that she would qualify for her hospice benefit.  Nancy [name] is  tearful but recognizes her mother's decline and that even with continuing hemodialysis she probably would not be able to do this for much longer.  Nancy [name] is facing several aunts that have strong opinions about patient continuing dialysis as multiple family members have been on dialysis in the past.  We discussed in order to continue dialysis she would have to have a designated person to go with her for each dialysis treatment.  That would be the family's responsibility to arrange.  Per chart review, I see that LTAC is referenced.  Patient has Medicaid so I am not sure whether she would qualify for that benefit.  I will place consult to social work.  In a SNF setting, Nancy [name] is going to begin working on either a Engineer, agricultural (patient has been under their services in the recent past), or paying out-of-pocket by  family members to have a caregiver go and sit with her.  Nancy [name] no longer has capacity to make her own healthcare decisions.  Her healthcare proxy would be her daughter, Nancy [name] at [phone]    SUMMARY OF RECOMMENDATIONS   DNR DNI Family wishes for patient to be transferred to SNF that can transport to dialysis.  Family recognizes that  patient cannot attend dialysis unless she has a one-to-one sitter to attend with her consistently. I did prepare daughter that even with the best of plans, her dementia is so advanced that she likely would still not be able to participate in dialysis much longer.  I attempted to prepare her for a prognosis of less than 3 months Code Status/Advance Care Planning:  DNR   Palliative Prophylaxis:   Aspiration, Bowel Regimen, Delirium Protocol, Eye Care, Frequent Pain Assessment, Oral Care and Turn Reposition  Additional Recommendations (Limitations, Scope, Preferences):  Avoid Hospitalization, No Artificial Feeding, No Chemotherapy, No Radiation, No Surgical Procedures and No Tracheostomy  Psycho-social/Spiritual:   Desire for further Chaplaincy  support:no  Additional Recommendations: Referral to Community Resources   Prognosis:   < 3 months in the setting of advanced dementia, end-stage renal disease on hemodialysis, possible renal cell carcinoma  Discharge Planning: Nancy [name] for rehab with Palliative care service follow-up      Primary Diagnoses: Present on Admission: . Acute osteomyelitis of thoracic spine ([place]) . ESRD (end stage renal disease) ([place])   I have reviewed the medical record, interviewed the patient and family, and examined the patient. The following aspects are pertinent.  Past Medical History:  Diagnosis Date  . Anemia   . Aortic stenosis   . Bacterial sinusitis 09/17/2011  . CHF (congestive heart failure) ([place])   . CKD (chronic kidney disease) stage 4, GFR 15-29 ml/min ([place]) 08/11/2006   Cr continues to increase. Proteinuria on UA 02/10/12.    . Colitis   . CVA (cerebrovascular accident) [facility])    New hemorrhagic per CT scan '09  . Diverticulosis of colon   . Dysfunctional uterine bleeding   . ESRD (end stage renal disease) on dialysis ([place])    "MWF; [facility]" (11/27/2017)  . Fecal impaction ([place])   . Headache(784.0)   . Heart murmur   . HERNIORRHAPHY, HX OF 08/11/2006  . Hypertension   . OA (osteoarthritis)    bilateral knees  . Postmenopausal   . Pulmonary nodule   . TINEA CRURIS 01/12/2007   Social History   Socioeconomic History  . Marital status: Widowed    Spouse name: Not on file  . Number of children: Not on file  . Years of education: Not on file  . Highest education level: Not on file  Occupational History  . Not on file  Social Needs  . Financial resource strain: Not on file  . Food insecurity:    Worry: Never true    Inability: Never true  . Transportation needs:    Medical: No    Non-medical: No  Tobacco Use  . Smoking status: Never Smoker  . Smokeless tobacco: Never Used  Substance and Sexual Activity  . Alcohol use: Not on file  . Drug use: No     Comment: 08/15/08 UDS + cocaine  . Sexual activity: Not Currently  Lifestyle  . Physical activity:    Days per week: 0 days    Minutes per session: 0 min  . Stress: Not on file  Relationships  . Social connections:    Talks on phone: More than three times a week    Gets together: More than three times a week    Attends religious service: More than 4 times per year    Active member of club or organization: No    Attends meetings of clubs or organizations: Never    Relationship status: Widowed  Other Topics Concern  . Not  on file  Social History Narrative   Member is widowed, has 1 daughter and 1 granddaughter and 3 great grandchildren      Family History  Problem Relation Age of Onset  . Hypertension Mother   . Congestive Heart Failure Mother   . Heart attack Brother 69   Scheduled Meds: . aspirin EC  81 mg Oral Daily  . calcitRIOL  2 mcg Oral Q M,W,F-HD  . Chlorhexidine Gluconate Cloth  6 each Topical Q0600  . darbepoetin (ARANESP) injection - DIALYSIS  60 mcg Intravenous Q Mon-HD  . feeding supplement (NEPRO CARB STEADY)  237 mL Oral BID BM  . feeding supplement (PRO-STAT SUGAR FREE 64)  30 mL Oral BID  . ferric citrate  420 mg Oral TID WC  . heparin  5,000 Units Subcutaneous Q8H  . latanoprost  1 drop Both Eyes QHS  . multivitamin  1 tablet Oral QHS  . polyethylene glycol  17 g Oral Daily  . senna-docusate  1 tablet Oral BID   Continuous Infusions: . ceFEPime (MAXIPIME) IV 2 g (07/10/18 1526)   PRN Meds:.acetaminophen **OR** acetaminophen, HYDROmorphone, OLANZapine zydis, promethazine Medications Prior to Admission:  Prior to Admission medications   Medication Sig Start Date End Date Taking? Authorizing Provider  latanoprost (XALATAN) 0.005 % ophthalmic solution Place 1 drop into both eyes at bedtime. 04/11/18  Yes [provider]  aspirin 81 MG EC tablet Take 1 tablet (81 mg total) by mouth daily. Patient not taking: Reported on 06/23/2018 05/14/18   [name], MD  calcitRIOL (ROCALTROL) 0.5 MCG capsule Take 4 capsules (2 mcg total) by mouth every Monday, Wednesday, and Friday with hemodialysis. Patient not taking: Reported on 06/23/2018 12/30/17   Neva Seat, MD  diphenhydrAMINE-zinc acetate (BENADRYL) cream Apply topically 3 (three) times daily as needed for itching. Patient not taking: Reported on 06/23/2018 04/20/18   Masoudi, Dorthula Rue, MD  oxyCODONE-acetaminophen (PERCOCET) 5-325 MG tablet Take 0.5-1 tablets by mouth every 4 (four) hours as needed. Patient not taking: Reported on 06/23/2018 05/14/18   Masoudi, Dorthula Rue, MD  senna-docusate (SENOKOT-S) 8.6-50 MG tablet Take 1 tablet by mouth at bedtime as needed for mild constipation. Patient not taking: Reported on 06/23/2018 05/14/18   [name], MD   Allergies  Allergen Reactions  . Hydrocodone Nausea And Vomiting and Other (See Comments)    Dizziness, also  . Tape Other (See Comments)    TAPE LEAVES DARK PATCHES ON THE SKIN!!   Review of Systems  Unable to perform ROS: Dementia    Physical Exam Vitals signs and nursing note reviewed.  Constitutional:      Comments: Frail, cachectic, elderly female.  She is agitated  Musculoskeletal: Normal range of motion.  Skin:    General: Skin is warm and dry.  Neurological:     Mental Status: She is alert.     Comments: Cannot tell me her name; does not believe that she is in the hospital Otherwise unable to test  Psychiatric:     Comments: Patient is agitated, confused, oriented only to self She is difficult to redirect Disorganized thought processes     Vital Signs: BP (!) 109/57 (BP Location: Right Arm)   Pulse 90   Temp (!) 97.5 F (36.4 C) (Oral)   Resp 17   Ht '5\' 3"'  (1.6 m)   Wt 37.6 kg   SpO2 100%   BMI 14.68 kg/m  Pain Scale: 0-10 POSS *See Group Information*: S-Acceptable,Sleep, easy to arouse Pain Score: Asleep  SpO2: SpO2: 100 % O2 Device:SpO2: 100 % O2 Flow Rate: .O2 Flow  Rate (L/min): 2 L/min  IO: Intake/output summary:   Intake/Output Summary (Last 24 hours) at 07/12/2018 1640 Last data filed at 07/12/2018 1052 Gross per 24 hour  Intake 150 ml  Output 0 ml  Net 150 ml    LBM: Last BM Date: 07/09/18 Baseline Weight: Weight: 45.3 kg Most recent weight: Weight: 37.6 kg     Palliative Assessment/Data:   Flowsheet Rows     Most Recent Value  Intake Tab  Referral Department  Hospitalist  Unit at Time of Referral  Med/Surg Unit  Palliative Care Primary Diagnosis  Nephrology  Date Notified  07/09/18  Palliative Care Type  Return patient Palliative Care  Reason for referral  Clarify Goals of Care, Psychosocial or Spiritual support  Date of Admission  07/03/18  Date first seen by Palliative Care  07/12/18  # of days Palliative referral response time  3 Day(s)  # of days IP prior to Palliative referral  6  Clinical Assessment  Palliative Performance Scale Score  30%  Pain Max last 24 hours  Not able to report  Pain Min Last 24 hours  Not able to report  Dyspnea Max Last 24 Hours  Not able to report  Dyspnea Min Last 24 hours  Not able to report  Nausea Max Last 24 Hours  Not able to report  Nausea Min Last 24 Hours  Not able to report  Anxiety Max Last 24 Hours  Not able to report  Anxiety Min Last 24 Hours  Not able to report  Other Max Last 24 Hours  Not able to report  Psychosocial & Spiritual Assessment  Palliative Care Outcomes  Patient/Family meeting held?  Yes  Who was at the meeting?  dtr Nancy [name]  Patient/Family wishes: Interventions discontinued/not started   Mechanical Ventilation, BiPAP, Vasopressors, Tube feedings/TPN, Trach, PEG      Time In: 1500 Time Out: 1615 Time Total: 75 min Greater than 50%  of this time was spent counseling and coordinating care related to the above assessment and plan. Staffed with FMTS  Signed by: Dory Horn, NP   Please contact Palliative Medicine Team phone at [phone] for questions and  concerns.  For individual provider: See Shea Evans

## 2018-07-13 LAB — RENAL FUNCTION PANEL
Albumin: 2.1 g/dL — ABNORMAL LOW (ref 3.5–5.0)
Anion gap: 16 — ABNORMAL HIGH (ref 5–15)
BUN: 18 mg/dL (ref 8–23)
CO2: 20 mmol/L — ABNORMAL LOW (ref 22–32)
Calcium: 9.2 mg/dL (ref 8.9–10.3)
Chloride: 99 mmol/L (ref 98–111)
Creatinine, Ser: 5.82 mg/dL — ABNORMAL HIGH (ref 0.44–1.00)
GFR calc Af Amer: 8 mL/min — ABNORMAL LOW (ref >60)
GFR calc non Af Amer: 7 mL/min — ABNORMAL LOW (ref >60)
Glucose, Bld: 86 mg/dL (ref 70–99)
Phosphorus: 3.7 mg/dL (ref 2.5–4.6)
Potassium: 4.8 mmol/L (ref 3.5–5.1)
Sodium: 135 mmol/L (ref 135–145)

## 2018-07-13 LAB — CBC
HCT: 32.4 % — ABNORMAL LOW (ref 36.0–46.0)
Hemoglobin: 9.4 g/dL — ABNORMAL LOW (ref 12.0–15.0)
MCH: 28 pg (ref 26.0–34.0)
MCHC: 29 g/dL — ABNORMAL LOW (ref 30.0–36.0)
MCV: 96.4 fL (ref 80.0–100.0)
Platelets: 119 10*3/uL — ABNORMAL LOW (ref 150–400)
RBC: 3.36 MIL/uL — ABNORMAL LOW (ref 3.87–5.11)
RDW: 18.5 % — ABNORMAL HIGH (ref 11.5–15.5)
WBC: 3.2 10*3/uL — ABNORMAL LOW (ref 4.0–10.5)
nRBC: 0 % (ref 0.0–0.2)

## 2018-07-13 MED ORDER — PENTAFLUOROPROP-TETRAFLUOROETH EX AERO: 1.0000 "application " | INHALATION_SPRAY | CUTANEOUS | Status: DC | PRN

## 2018-07-13 MED ORDER — LIDOCAINE HCL (PF) 1 % IJ SOLN: 5.0000 mL | INTRAMUSCULAR | Status: DC | PRN

## 2018-07-13 MED ORDER — SODIUM CHLORIDE 0.9 % IV SOLN: 100.0000 mL | INTRAVENOUS | Status: DC | PRN

## 2018-07-13 MED ORDER — HYDROMORPHONE HCL 2 MG PO TABS
2.0000 mg | ORAL_TABLET | Freq: Once | ORAL | Status: AC
  Administered 2018-07-13: 2 mg via ORAL
  Filled 2018-07-13: qty 1

## 2018-07-13 MED ORDER — LIDOCAINE-PRILOCAINE 2.5-2.5 % EX CREA: 1.0000 "application " | TOPICAL_CREAM | CUTANEOUS | Status: DC | PRN

## 2018-07-13 MED ORDER — ALTEPLASE 2 MG IJ SOLR
2.0000 mg | Freq: Once | INTRAMUSCULAR | Status: DC | PRN
  Filled 2018-07-13: qty 2

## 2018-07-13 MED ORDER — HEPARIN SODIUM (PORCINE) 1000 UNIT/ML DIALYSIS: 1000.0000 [IU] | INTRAMUSCULAR | Status: DC | PRN

## 2018-07-13 MED ORDER — HYDROMORPHONE HCL 2 MG PO TABS
ORAL_TABLET | ORAL | Status: AC
  Filled 2018-07-13: qty 1

## 2018-07-13 MED ORDER — DARBEPOETIN ALFA 60 MCG/0.3ML IJ SOSY
PREFILLED_SYRINGE | INTRAMUSCULAR | Status: AC
  Administered 2018-07-13: 60 ug via INTRAVENOUS
  Filled 2018-07-13: qty 0.3

## 2018-07-13 MED ORDER — PROMETHAZINE HCL 25 MG PO TABS
ORAL_TABLET | ORAL | Status: AC
  Filled 2018-07-13: qty 1

## 2018-07-13 MED ORDER — CALCITRIOL 0.5 MCG PO CAPS
ORAL_CAPSULE | ORAL | Status: AC
  Administered 2018-07-13: 2 ug via ORAL
  Filled 2018-07-13: qty 4

## 2018-07-13 NOTE — Progress Notes (Signed)
Physical Therapy Treatment Patient Details Name: Nancy [name] MRN: [id number] DOB: Jul 24, 1940 Today's Date: 07/13/2018    History of Present Illness 78 yo female with a medical history of ESRD on HD MWF, CVA, HTN, diastolic heart failure (EF 60-65% and grade 1 diastolic dysfunction on 07/624 ECHO), and GERD who presented to the ED with right sided trunk and back pain after she fell tried to sit down into her wheelchair. Pt also frequently misses dialysis sessions. She reports that she was last dialyzed on Wednesday 12/18.  MRI revealed T12-L1 discitis osteomyelitis with ventral epidural abscess mildly flattening the lower cord    PT Comments    Pt progressing towards physical therapy goals. Was able to perform transfers to/from EOB with up to max assist. Pt following commands minimally throughout session - dementia related and unchanged from previous sessions. Per nursing staff, pt was able to ambulate into the bathroom for a shower after bed rest order was lifted. Will continue to follow and progress as able per POC.    Follow Up Recommendations  SNF     Equipment Recommendations  None recommended by PT    Recommendations for Other Services       Precautions / Restrictions Precautions Precautions: Fall Precaution Comments: back precautions for comfort Restrictions Weight Bearing Restrictions: No Other Position/Activity Restrictions: Bed rest now lifted however PT orders remain    Mobility  Bed Mobility Overal bed mobility: Needs Assistance Bed Mobility: Rolling;Sidelying to Sit;Sit to Sidelying Rolling: Min assist Sidelying to sit: Max assist     Sit to sidelying: Max assist General bed mobility comments: Assist to facilitate roll. Max assist required for transition to/from EOB as pt not following commands.   Transfers                 General transfer comment: Pt not willing to attempt  Ambulation/Gait                 Stairs              Wheelchair Mobility    Modified Rankin (Stroke Patients Only)       Balance Overall balance assessment: Needs assistance Sitting-balance support: Feet supported;No upper extremity supported Sitting balance-Leahy Scale: Fair Sitting balance - Comments: Unable to assess at this time.    Standing balance support: Bilateral upper extremity supported;During functional activity Standing balance-Leahy Scale: Poor Standing balance comment: Reliant on physical A and UE support                            Cognition Arousal/Alertness: Awake/alert Behavior During Therapy: Agitated;Restless Overall Cognitive Status: Impaired/Different from baseline Area of Impairment: Attention;Following commands;Orientation;Awareness;Problem solving;Memory                 Orientation Level: Disoriented to;Time;Situation Current Attention Level: Focused Memory: Decreased short-term memory;Decreased recall of precautions Following Commands: Follows one step commands inconsistently;Follows one step commands with increased time Safety/Judgement: Decreased awareness of deficits;Decreased awareness of safety Awareness: Intellectual Problem Solving: Slow processing;Decreased initiation;Requires verbal cues;Difficulty sequencing;Requires tactile cues General Comments: Pt will arouse minmally with stimulation and will follow commands occasionally, but drifts back to sleep       Exercises      General Comments        Pertinent Vitals/Pain Pain Assessment: Faces Faces Pain Scale: Hurts little more Pain Location: Back Pain Descriptors / Indicators: Grimacing Pain Intervention(s): Monitored during session    Home Living  Prior Function            PT Goals (current goals can now be found in the care plan section) Acute Rehab PT Goals Patient Stated Goal: Get her "drawers" on PT Goal Formulation: Patient unable to participate in goal setting Time For  Goal Achievement: 07/18/18 Potential to Achieve Goals: Fair Progress towards PT goals: Progressing toward goals    Frequency    Min 2X/week      PT Plan Current plan remains appropriate    Co-evaluation              AM-PAC PT "6 Clicks" Mobility   Outcome Measure  Help needed turning from your back to your side while in a flat bed without using bedrails?: A Little Help needed moving from lying on your back to sitting on the side of a flat bed without using bedrails?: A Lot Help needed moving to and from a bed to a chair (including a wheelchair)?: A Lot Help needed standing up from a chair using your arms (e.g., wheelchair or bedside chair)?: A Lot Help needed to walk in hospital room?: Total Help needed climbing 3-5 steps with a railing? : Total 6 Click Score: 11    End of Session Equipment Utilized During Treatment: Gait belt Activity Tolerance: Patient limited by pain;Treatment limited secondary to agitation Patient left: in bed;with call bell/phone within reach;with bed alarm set Nurse Communication: Mobility status PT Visit Diagnosis: Other abnormalities of gait and mobility (R26.89);Pain Pain - part of body: (back)     Time: 1000-1023 PT Time Calculation (min) (ACUTE ONLY): 23 min  Charges:  $Therapeutic Activity: 8-22 mins                     Rolinda Roan, PT, DPT Acute Rehabilitation Services Pager: [phone] Office: [phone]    Thelma Comp 07/13/2018, 12:41 PM

## 2018-07-13 NOTE — Progress Notes (Addendum)
[facility] Progress Note   Subjective: Seen and examined at bedside.  No new events.    Objective Vitals:   07/12/18 2004 07/12/18 2004 07/13/18 0317 07/13/18 0900  BP: (!) 111/49 (!) 111/49 (!) 119/50 119/60  Pulse: 89 90 92 98  Resp: 20 20 20 18   Temp: (!) 97.3 F (36.3 C) (!) 97.3 F (36.3 C) 98.7 F (37.1 C) 97.8 F (36.6 C)  TempSrc: Oral Oral Oral Oral  SpO2: 100% 100% 100% 100%  Weight:   40.8 kg   Height:       Physical Exam General:NAD, frail, chronically ill appearing, agitated female Heart:RRR Lungs:CTAB Abdomen:soft, NTND Extremities:no LE edema Dialysis Access: LU AVF +b/t - darkened skin over AVF   [facility] Weights   07/11/18 2045 07/12/18 0500 07/13/18 0317  Weight: 37.6 kg 37.6 kg 40.8 kg    Intake/Output Summary (Last 24 hours) at 07/13/2018 1157 Last data filed at 07/13/2018 0900 Gross per 24 hour  Intake 180 ml  Output -  Net 180 ml    Additional Objective Labs: Basic Metabolic Panel: Recent Labs  Lab 07/07/18 0801  NA 138  K 3.7  CL 100  CO2 24  GLUCOSE 81  BUN 7*  CREATININE 2.59*  CALCIUM 8.8*   CBC: Recent Labs  Lab 07/08/18 0727 07/13/18 0737  WBC 4.7 3.2*  HGB 10.2* 9.4*  HCT 34.9* 32.4*  MCV 94.6 96.4  PLT 121* 119*    Medications: . [START ON 07/14/2018] sodium chloride    . [START ON 07/14/2018] sodium chloride    . ceFEPime (MAXIPIME) IV 2 g (07/10/18 1526)   . aspirin EC  81 mg Oral Daily  . calcitRIOL  2 mcg Oral Q M,W,F-HD  . Chlorhexidine Gluconate Cloth  6 each Topical Q0600  . darbepoetin (ARANESP) injection - DIALYSIS  60 mcg Intravenous Q Mon-HD  . feeding supplement (NEPRO CARB STEADY)  237 mL Oral BID BM  . feeding supplement (PRO-STAT SUGAR FREE 64)  30 mL Oral BID  . ferric citrate  420 mg Oral TID WC  . heparin  5,000 Units Subcutaneous Q8H  . latanoprost  1 drop Both Eyes QHS  . multivitamin  1 tablet Oral QHS  . polyethylene glycol  17 g Oral Daily  . senna-docusate  1 tablet Oral  BID    Dialysis Orders: [facility] MWF 3.5 hrs 180NRe 400/Autoflow 1.5 45.5 kg 3.0 K/ 2.0 Ca  LUA AVF -No heparin -Mircera 225 mcg IV (Last dose 06/02/2018 Last HGB 10.2 06/22/18) -Calcitriol 0.25 mcg PO TIW (Last PTH 136 06/02/18 Last Phos 8.1 06/22/18)  Assessment/Plan: 1. S/p mechanical fall, osteomyelitis/discitis of T12-L1 w/ventral epidural abscess - On 6-8 week course cefepime - this will be a challenge to implement once d/c due to OP noncompliance w/HD.  Per neuro needs repeat MRI prior to d/c of antibiotic therapy. Per primary.  2. ESRD - On HD MWF. Remains disruptive throughout HD today, requiring constant redirection.  Currently is not a candidate for outpatient dialysis. She is too disruptive and HD is not safe (has pulled out needles, etc).  Can reevaluate if drastic change in behavior is observed on consecutive treatments.  3. Anemia of CKD- Hgb stable, 9.4. Continue ESA.  4. Secondary hyperparathyroidism - Ca and phos at goal. On auryxia 5. HTN/volume - BP/volume stable. Minimal UF on HD.  6. Malnutrition/FTT - Alb low. Diet liberalized w/fluid restriction.  On Nepro/Prostat.  Continues to loose weight.  7. Dementia/Agitation/Delirium - Psych eval on  07/09/2018 stated she lacks capacity regarding medical decisions.  On Zyprexa for agitation.  Daughter is POC.  Palliative care consulted.  8.  Hx CVA/AS/mod MS - EF ok 9. Dispo - Poor prognosis w/poor QOL.  Patient is not a candidate for OP dialysis.  Family wants patient to remain on dialysis for now, CM looking for options.    Jen Mow, PA-C [facility] Pager: [phone] 07/13/2018,11:57 AM  LOS: 10 days   Pt seen, examined and agree w A/P as above. Unless behavior significantly changes patient remains not a candidate for OP dialysis due to safety issues.   Kelly Splinter MD [employer] pager [phone]   07/13/2018, 4:02 PM

## 2018-07-13 NOTE — Care Management Note (Signed)
Case Management Note [name], RN MSN CCM Transitions of Care 30[facility] [place] [phone]  Patient Details  Name: Nancy [name] MRN: [id number] Date of Birth: [birth year]  Subjective/Objective:          Acute osteomyelitis          Action/Plan: PTA home with family. Transition recommendation for snf, however, patient has not been able to have dialysis in the chair while in the hospital. Nursing reports that patient cries in pain when the head of her bed is raised and refusing to get up in chair when offered d/t pain. Spoke with granddaughter, Nancy [name], to offer choice for LTACH. Nancy [name] chose [facility] and stated that other choice was not an option. [facility] is unable to offer a bed at this time. Nancy [name] wants her grandmother to be up in chair stating that she has had this pain for 6 months. Nancy [name] states that she has been having to get patient up to chair herself and stay with patient throughout treatment, and she will continue to do this. Nancy [name] asked to come to hospital today to put her grandmother in the chair stating she doesn't want her grandmother to be bedridden. Nursing called HD dept for dialysis chair to be brought to room. Dr. Annie Paras notified of situation. Spoke with nephrology PA-C, Nancy [name], about order for granddaughter to stay with patient during dialysis (like patient has when outpatient). Will continue to monitor for transition of care needs.   Expected Discharge Date:  07/14/18               Expected Discharge Plan:  [place]  In-House Referral:  Clinical Social Work  Discharge planning Services  CM Consult, Other - See comment(LTACH)  Post Acute Care Choice:  Long Term Acute Care (LTAC) Choice offered to:  Adult Children  DME Arranged:  N/A DME Agency:  NA  HH Arranged:  NA HH Agency:  NA  Status of Service:  In process, will continue to follow  If discussed at Long Length of Stay Meetings, dates discussed:    Additional Comments: 07/13/18  1620-Received call from MD about LTACH potential since patient cannot return to outpatient hemodialysis. Discussed with MD that pt is not a candidate for LTACH. Spoke with liaison at [facility] who agreed that pt was not an LTACH candidate. Will continue to follow for transition of care needs.   07/10/18 [place], RN MSN CCM Pt's granddaughter, Nancy [name], assisted patient out of bed and into shower. Patient taken to hemodialysis in recliner. Nancy [name] at chairside for patient comfort. Noted CSW following for snf placement-pending authorization for [facility].  Will continue to follow for transition of care needs.   Bartholomew Crews, RN 07/13/2018, 4:20 PM

## 2018-07-13 NOTE — Clinical Social Work Note (Signed)
CSW advised by admissions director Olivia Mackie at [facility] that authorization received. MD contacted and advised of authorization and CSW informed that patient not medically stable for discharge at this time. Olivia Mackie contacted and informed. CSW will continue to follow and facilitate discharge to [facility] once medically stable.  Tallin Hart Givens, MSW, LCSW Licensed Clinical Social Worker [place] [phone]

## 2018-07-13 NOTE — Progress Notes (Signed)
Occupational Therapy Treatment Patient Details Name: Nancy [name] MRN: [id number] DOB: August 25, 1940 Today's Date: 07/13/2018    History of present illness 78 yo female with a medical history of ESRD on HD MWF, CVA, HTN, diastolic heart failure (EF 60-65% and grade 1 diastolic dysfunction on 10/4032 ECHO), and GERD who presented to the ED with right sided trunk and back pain after she fell tried to sit down into her wheelchair. Pt also frequently misses dialysis sessions. She reports that she was last dialyzed on Wednesday 12/18.  MRI revealed T12-L1 discitis osteomyelitis with ventral epidural abscess mildly flattening the lower cord   OT comments  Pt continues to present with decreased occupational performance and participation. Pt requiring Max cues for following commands and participation. Pt requiring Max A for bed mobility and declines OOB activity. Continue to recommend dc to SNF and will continue to follow acutely as admitted.    Follow Up Recommendations  SNF;Supervision/Assistance - 24 hour    Equipment Recommendations  None recommended by OT    Recommendations for Other Services      Precautions / Restrictions Precautions Precautions: Fall Precaution Comments: back precautions for comfort Restrictions Weight Bearing Restrictions: No Other Position/Activity Restrictions: Bed rest now lifted however PT orders remain       Mobility Bed Mobility Overal bed mobility: Needs Assistance Bed Mobility: Rolling;Sidelying to Sit;Sit to Sidelying Rolling: Min assist Sidelying to sit: Max assist     Sit to sidelying: Max assist General bed mobility comments: Assist to facilitate roll. Max assist required for transition to/from EOB as pt not following commands.   Transfers                 General transfer comment: Pt not willing to attempt    Balance Overall balance assessment: Needs assistance Sitting-balance support: Feet supported;No upper extremity  supported Sitting balance-Leahy Scale: Fair Sitting balance - Comments: Unable to assess at this time.    Standing balance support: Bilateral upper extremity supported;During functional activity Standing balance-Leahy Scale: Poor Standing balance comment: Reliant on physical A and UE support                           ADL either performed or assessed with clinical judgement   ADL Overall ADL's : Needs assistance/impaired     Grooming: Min guard;Sitting Grooming Details (indicate cue type and reason): Pt applying lotion to her hands while seated at EOB with Min guard A for safety Upper Body Bathing: Maximal assistance;Sitting Upper Body Bathing Details (indicate cue type and reason): Applying lotion to pt's back Lower Body Bathing: Total assistance;Sit to/from stand Lower Body Bathing Details (indicate cue type and reason): Total A to apply lotion to her legs. Encouraging pt to participate but she refused                       General ADL Comments: Pt presenting with poor participation and activity tolerance. Requiring Max cues and Max-Total A     Vision       Perception     Praxis      Cognition Arousal/Alertness: Awake/alert Behavior During Therapy: Agitated;Restless Overall Cognitive Status: Impaired/Different from baseline Area of Impairment: Attention;Following commands;Orientation;Awareness;Problem solving;Memory                 Orientation Level: Disoriented to;Time;Situation Current Attention Level: Focused Memory: Decreased short-term memory;Decreased recall of precautions Following Commands: Follows one step commands inconsistently;Follows one step commands  with increased time Safety/Judgement: Decreased awareness of deficits;Decreased awareness of safety Awareness: Intellectual Problem Solving: Slow processing;Decreased initiation;Requires verbal cues;Difficulty sequencing;Requires tactile cues General Comments: Pt requiring increased  cues and time throughout session. Agitated with staff and requiring calm environement to maximize participation        Exercises     Shoulder Instructions       General Comments Daughter calling during session and telling Mrs. Marich that she will be at hospital in 30 minutes    Pertinent Vitals/ Pain       Pain Assessment: Faces Faces Pain Scale: Hurts even more Pain Location: Back Pain Descriptors / Indicators: Grimacing Pain Intervention(s): Monitored during session;Limited activity within patient's tolerance;Repositioned  Home Living                                          Prior Functioning/Environment              Frequency  Min 2X/week        Progress Toward Goals  OT Goals(current goals can now be found in the care plan section)  Progress towards OT goals: Not progressing toward goals - comment  Acute Rehab OT Goals Patient Stated Goal: Get her "drawers" on OT Goal Formulation: With patient Time For Goal Achievement: 07/21/18 Potential to Achieve Goals: Fair ADL Goals Pt Will Perform Grooming: with set-up;sitting Pt Will Perform Upper Body Bathing: with min assist;sitting Pt Will Transfer to Toilet: with mod assist;ambulating Pt Will Perform Toileting - Clothing Manipulation and hygiene: with max assist;sit to/from stand  Plan Discharge plan remains appropriate    Co-evaluation    PT/OT/SLP Co-Evaluation/Treatment: Yes Reason for Co-Treatment: For patient/therapist safety;To address functional/ADL transfers;Necessary to address cognition/behavior during functional activity;Complexity of the patient's impairments (multi-system involvement)   OT goals addressed during session: ADL's and self-care      AM-PAC OT "6 Clicks" Daily Activity     Outcome Measure   Help from another person eating meals?: A Lot Help from another person taking care of personal grooming?: A Lot Help from another person toileting, which includes using  toliet, bedpan, or urinal?: Total Help from another person bathing (including washing, rinsing, drying)?: Total Help from another person to put on and taking off regular upper body clothing?: Total Help from another person to put on and taking off regular lower body clothing?: Total 6 Click Score: 8    End of Session    OT Visit Diagnosis: Muscle weakness (generalized) (M62.81)   Activity Tolerance Treatment limited secondary to agitation   Patient Left in bed;with call bell/phone within reach;with bed alarm set   Nurse Communication Mobility status;Precautions        Time: 1000-1024 OT Time Calculation (min): 24 min  Charges: OT General Charges $OT Visit: 1 Visit OT Treatments $Self Care/Home Management : 8-22 mins  [place], OTR/L Acute Rehab Pager: [phone] Office: [place] 07/13/2018, 2:11 PM

## 2018-07-13 NOTE — Progress Notes (Signed)
   Subjective: Received an extra dose of dilaudid overnight for breakthrough back pain. Patient reports feeling well this morning. She said her back pain was better than it was yesterday. She ate all of her breakfast. Her daughter is planning on visiting later and she said she wanted to go to the bank.   Objective:  Vital signs in last 24 hours: Vitals:   07/12/18 1759 07/12/18 2004 07/12/18 2004 07/13/18 0317  BP: (!) 122/54 (!) 111/49 (!) 111/49 (!) 119/50  Pulse: 91 89 90 92  Resp: 16 20 20 20   Temp: 97.7 F (36.5 C) (!) 97.3 F (36.3 C) (!) 97.3 F (36.3 C) 98.7 F (37.1 C)  TempSrc: Oral Oral Oral Oral  SpO2: 100% 100% 100% 100%  Weight:      Height:       General: Thin female in no acute distress CV: Systolic and diastolic murmur. Pulm: CTAB. No wheezes, rales, or rhonchi. Neuro: 5/5 equal strength in the bilateral lower extremities.  Assessment/Plan:  Principal Problem:   Acute osteomyelitis of thoracic spine (HCC) Active Problems:   Palliative care encounter   ESRD (end stage renal disease) ([place])   Epidural abscess   Psychomotor agitation  [name] is a18 yoF with ESRD on HD MWF, CVA,and HTN presenting with right sided trunk/back pain after a fall, found to have osteomylitis/discitis of T12-L1 with ventral epidural abscess. She was subsequently admitted for further evaluation and management.  Osteomyelitis with ventral epidural abscess -Stable. Afebrile. Plan -Continue cefepime with HD for a 6-8 week course. - Repeat MRI prior to discontinuation of antibiotic therapy per neurosurgery -Continue PO dilaudid PRN  ESRD on HD (MWF) -Last dialyzed 1/3 - Not a candidate for outpatient dialysis per nephrology unless she goes to an [facility] - Not eligible for [facility] per case management. - Palliative care held a family meeting on 1/5. Family wishes to continue dialysis at SNF and are trying to secure a personal caretaker during each dialysis session. Per  nephrology, she cannot do HD at SNF. Plan - HD today - Nephrology following, appreciate recs and placement options  Dementia and Agitation - During her hospitalization, she has been refusing her medications and has had episodes of shouting and agitation. She has not yet required any PRN antipsychotics during this hospitalization. -Found to not have capacity by psychiatry on 1/2. Appreciate recommendation Plan - Zyprexa 2.5mg  PRN for agitation.   Dispo:Awaiting placement at SNF vs LTACH.Nephrology has determined that she is not a candidate for outpatient dialysis. Case management has determined that she's not a candidate for LTACH. Will continue to search for placement options.  Nancy [name], Nancy [place], MD 07/13/2018, 6:32 AM Pager: [phone]

## 2018-07-13 NOTE — Progress Notes (Signed)
Medicine attending: I examined this patient together with resident physician Dr Vilma Prader and I concur with her evaluation and management plan. No acute change.  She continues a prolonged course of parenteral cefepime for Pseudomonas osteomyelitis/discitis. Motor exam of the lower extremities remains normal.  Reflexes absent but symmetric. Ongoing discussions with regard to ability to continue dialysis.  Per nephrology, she is no longer a suitable candidate for an outpatient center.  If the family wants her to continue on dialysis, it will have to be done in the context of a long-term care facility.  Care management team involved in locating an accepting facility.

## 2018-07-14 DIAGNOSIS — R451 Restlessness and agitation: Secondary | ICD-10-CM

## 2018-07-14 MED ORDER — CHLORHEXIDINE GLUCONATE CLOTH 2 % EX PADS
6.0000 | MEDICATED_PAD | Freq: Every day | CUTANEOUS | Status: DC
  Administered 2018-07-15 – 2018-07-18 (×2): 6 via TOPICAL

## 2018-07-14 NOTE — Progress Notes (Addendum)
[facility] Progress Note   Subjective:   Seen and examined at bedside.  No acute events overnight. Patient resting in bed.    Objective Vitals:   07/13/18 2006 07/13/18 2107 07/14/18 0259 07/14/18 0819  BP: (!) 100/45  (!) 117/50 (!) 117/51  Pulse: 97  93 96  Resp: 16  18 18   Temp: 98.2 F (36.8 C)  97.7 F (36.5 C) 97.8 F (36.6 C)  TempSrc: Oral  Oral Oral  SpO2: 100%  100% 100%  Weight:  38 kg    Height:       Physical Exam General:NAD, frail, chronically ill appearing female, resting in bed Heart:RRR Lungs:breathing non labored, symmetric respirations Extremities:no LE edema Dialysis Access: LU AVF w/darkened skin  Filed Weights   07/13/18 1230 07/13/18 1547 07/13/18 2107  Weight: 41 kg 40 kg 38 kg    Intake/Output Summary (Last 24 hours) at 07/14/2018 1119 Last data filed at 07/14/2018 0900 Gross per 24 hour  Intake 120 ml  Output 479 ml  Net -359 ml    Additional Objective Labs: Basic Metabolic Panel: Recent Labs  Lab 07/13/18 1406  NA 135  K 4.8  CL 99  CO2 20*  GLUCOSE 86  BUN 18  CREATININE 5.82*  CALCIUM 9.2  PHOS 3.7   Liver Function Tests: Recent Labs  Lab 07/13/18 1406  ALBUMIN 2.1*   CBC: Recent Labs  Lab 07/08/18 0727 07/13/18 0737  WBC 4.7 3.2*  HGB 10.2* 9.4*  HCT 34.9* 32.4*  MCV 94.6 96.4  PLT 121* 119*   Blood Culture    Component Value Date/Time   SDES ABSCESS T12 07/04/2018 1626   SPECREQUEST NONE 07/04/2018 1626   CULT  07/04/2018 1626    RARE PSEUDOMONAS AERUGINOSA NO ANAEROBES ISOLATED Performed at [facility], [place] [address], [place], [place] [address]    REPTSTATUS 07/09/2018 FINAL 07/04/2018 1626    Medications: . ceFEPime (MAXIPIME) IV 2 g (07/13/18 1439)   . aspirin EC  81 mg Oral Daily  . calcitRIOL  2 mcg Oral Q M,W,F-HD  . darbepoetin (ARANESP) injection - DIALYSIS  60 mcg Intravenous Q Mon-HD  . feeding supplement (NEPRO CARB STEADY)  237 mL Oral BID BM  . feeding  supplement (PRO-STAT SUGAR FREE 64)  30 mL Oral BID  . ferric citrate  420 mg Oral TID WC  . heparin  5,000 Units Subcutaneous Q8H  . latanoprost  1 drop Both Eyes QHS  . multivitamin  1 tablet Oral QHS  . polyethylene glycol  17 g Oral Daily  . senna-docusate  1 tablet Oral BID    Dialysis Orders: [facility] MWF 3.5 hrs 180NRe 400/Autoflow 1.5 45.5 kg 3.0 K/ 2.0 Ca  LUA AVF -No heparin -Mircera 225 mcg IV (Last dose 06/02/2018 Last HGB 10.2 06/22/18) -Calcitriol 0.25 mcg PO TIW (Last PTH 136 06/02/18 Last Phos 8.1 06/22/18)  Assessment/Plan: 1. S/p mechanical fall, osteomyelitis/discitis of T12-L1 w/ventral epidural abscess - On 6-8 week course cefepime - this will be a challenge to implement once d/c due to OP noncompliance w/HD.  Per neuro needs repeat MRI prior to d/c of antibiotic therapy. Per primary. PT evaluating.  2. ESRD - On HD MWF.   Net UF removed 4100mL.  Orders written for HD tomorrow. Disruptive throughout HD yesterday.  Yelling out and difficultly preventing movement of cannulated arm.  Safety if a big issue. Currently is not a candidate for outpatient dialysis. She is too disruptive and HD is not  safe (has pulled out needles, etc).  Can reevaluate if appropriate behavior is observed on consecutive treatments.  3. Anemia of CKD- Hgb stable, 9.4. Continue ESA.  4. Secondary hyperparathyroidism - Ca and phos at goal. On auryxia 5. HTN/volume - BP/volume stable. Minimal UF on HD.  6. Malnutrition/FTT - Alb low. Diet liberalized w/fluid restriction.  On Nepro/Prostat.  Continues to lose weight.  7. Dementia/Agitation/Delirium - Psych eval on 07/09/2018 stated she lacks capacity regarding medical decisions.  On Zyprexa for agitation.  Daughter is POC.  Palliative care consulted.  8.  Hx CVA/AS/mod MS - EF ok 9. Dispo - Poor prognosis w/poor QOL.  Patient is not a candidate for OP dialysis.  Family wants patient to remain on dialysis for now, CM looking for options.   Jen Mow, PA-C [facility] Pager: [phone] 07/14/2018,11:19 AM  LOS: 11 days   Pt seen, examined and agree w A/P as above.  Kelly Splinter MD [employer] pager [phone]   07/14/2018, 1:07 PM

## 2018-07-14 NOTE — Progress Notes (Signed)
Medicine attending: I examined this patient today together with resident physician Dr Vilma Prader and I concur with her evaluation and management plan. No acute clinical change.  Much calmer the last few days.  We have not needed to administer anxiolytics.  Still with flight of ideas. She remains afebrile. 1.  Osteomyelitis/discitis.  Antibiotics started July 04, 2018.  Today day 10.  Plan 6-8-week course. 2.  End-stage renal disease on dialysis. Working out details with family and nephrology for post hospital treatment. 3.  Intermittent psychomotor agitation.  Suspect early dementia.  PRN Zyprexa per psychiatry rec.

## 2018-07-14 NOTE — Progress Notes (Addendum)
Initial Nutrition Assessment  DOCUMENTATION CODES:   Severe malnutrition in context of chronic illness, Underweight  INTERVENTION:   Feeding assistance required and encourage pt to eat and drink at meal times   Agree with liberalize diet but downgrading to Dysphagia 3 diet as pt is edentulous  Magic cup TID with meals, each supplement provides 290 kcal and 9 grams of protein  Continue Nepro Shake po BID, each supplement provides 425 kcal and 19 grams protein  Continue Prostat 30 ml BID. Each supplement provides 100 kcal and 15 grams of protein.   NUTRITION DIAGNOSIS:   Severe Malnutrition related to chronic illness(ESRD on HD and dementia) as evidenced by severe muscle depletion, severe fat depletion, percent weight loss.   GOAL:   Patient will meet greater than or equal to 90% of their needs   MONITOR:   Supplement acceptance, PO intake, Labs, Weight trends  REASON FOR ASSESSMENT:   Malnutrition Screening Tool    ASSESSMENT:   78 y.o female with ESRD on HD MWF, CVA, HTN. Pt admitted with pain from a fall, found acute osteomyelitis with ventral epidural abscess and dementia.   1/6 Pt currently not candidate for dialysis outpatient, waiting on discharge placement SNF. Family is searching for sitter to accompany to dialysis.   Discussed pt with RN. RN fed pt breakfast this morning eating 25%. Pt declining meds, nepro and prostats.   Nutrition focused exam showed severe muscle and fat depletions. Noted pt without teeth. Recommend dysphagia 3 diet. Pt amenable with Magic Cups at meals. Vanilla flavor.   Pt weight has been declining steadily. Dry weight in July 2019 was 58 kg, Current outpatient dry weight is 45.5kg. Current inpatient weight is 38 kg.  Pt is significantly below EDW per nephrology note, pt continues to lose weight. 34% weight loss since July which is significant.   Medications reviewed and include: Calcitriol Ferric Citrate  Rena-Vit  Miralax   Senna  Labs reviewed: Creatinine 5.82 (H)   NUTRITION - FOCUSED PHYSICAL EXAM:    Most Recent Value  Orbital Region  Moderate depletion  Upper Arm Region  Severe depletion  Thoracic and Lumbar Region  Severe depletion  Buccal Region  Moderate depletion  Temple Region  Severe depletion  Clavicle Bone Region  Severe depletion  Clavicle and Acromion Bone Region  Severe depletion  Scapular Bone Region  Severe depletion  Dorsal Hand  Moderate depletion  Patellar Region  Severe depletion  Anterior Thigh Region  Severe depletion  Posterior Calf Region  Severe depletion  Edema (RD Assessment)  None  Hair  Reviewed  Eyes  Reviewed  Mouth  Reviewed [no teeth]  Skin  Reviewed  Nails  Reviewed       Diet Order:   Diet Order            Diet regular Room service appropriate? Yes; Fluid consistency: Thin; Fluid restriction: 1200 mL Fluid  Diet effective now              EDUCATION NEEDS:   Not appropriate for education at this time  Skin:  Skin Assessment: Reviewed RN Assessment  Last BM:  1/2- Type 6  Height:   Ht Readings from Last 1 Encounters:  07/03/18 5\' 3"  (1.6 m)    Weight:   Wt Readings from Last 1 Encounters:  07/13/18 38 kg    Ideal Body Weight:  52 kg  BMI:  Body mass index is 14.84 kg/m.  Estimated Nutritional Needs:   Kcal:  1500-1700  kcal/d  Protein:  75-85 g/d  Fluid:  1,000 ml + UOP   Mauricia Area, MS, Dietetic Intern Pager: [phone] After hours Pager: [phone]

## 2018-07-14 NOTE — Progress Notes (Addendum)
   Subjective: No overnight events. She was seen laying comfortably in bed. She reported she was feeling well, but wanted to get in touch with her daughter. She stated she wanted to leave to get more clothes and undergarments. She was informed she would be in the hospital for some time and she said "no I'm not." She asked if she could go home. It was explained to her why she should not go home at this time and that physical therapy will be by to work with her.   Objective:  Vital signs in last 24 hours: Vitals:   07/13/18 2006 07/13/18 2107 07/14/18 0259 07/14/18 0819  BP: (!) 100/45  (!) 117/50 (!) 117/51  Pulse: 97  93 96  Resp: 16  18 18   Temp: 98.2 F (36.8 C)  97.7 F (36.5 C) 97.8 F (36.6 C)  TempSrc: Oral  Oral Oral  SpO2: 100%  100% 100%  Weight:  38 kg    Height:       General:Thin femalein no acute distress CV: Systolic and diastolic murmur. Pulm: CTAB. No wheezes, rales, or rhonchi. Neuro:5/5 equal strength in the bilateral lower extremities. No lower extremity edema.  Assessment/Plan:  Principal Problem:   Acute osteomyelitis of thoracic spine (HCC) Active Problems:   Palliative care encounter   ESRD (end stage renal disease) ([place])   Epidural abscess   Psychomotor agitation  Ms. Wittner is a81 yoF with ESRD on HD MWF, CVA,and HTN presenting with right sided trunk/back pain after a fall, found to have osteomylitis/discitis of T12-L1 with ventral epidural abscess. She was subsequently admitted for further evaluation and management.  Osteomyelitis with ventral epidural abscess -Stable. Afebrile. Plan -Continue cefepime with HD for a 6-8 week course. - Repeat MRI prior to discontinuation of antibiotic therapy per neurosurgery -ContinuePO dilaudid PRN - PT eval and treat  ESRD on HD (MWF) -Last dialyzed 1/6 - Not suitable for outpatient dialysis per nephrology. Based on their most recent note, they will continue to observe her during dialysis.  However, she will need to display significantly less disruptive behavior for several consecutive sessions before she could be eligible for outpatient dialysis. - Not eligible for LTACH per case management. - Palliative care held a family meeting on 1/5. Family wishes to continue dialysis at SNF and are trying to secure a personal caretaker during each dialysis session. Per nephrology, she cannot do HD at SNF. Plan - Palliative care following, appreciate recs and placement options - Nephrology following, appreciate recs and placement options  Dementia and Agitation - During her hospitalization, she has been refusing her medications and has had episodes of shouting and agitation. She has not yet required any PRN antipsychotics during this hospitalization. -Found to not have capacity by psychiatry on 1/2. Appreciate recommendation Plan - Zyprexa 2.5mg  PRN for agitation.   Dispo:Awaiting placement at SNF vs LTACH.Nephrologyhas determined that she is not a candidate for outpatient dialysis. Case management has determined that she's not a candidate for LTACH. Will continue to search for placement options.  Vilma Prader MD 07/14/2018 Pager: [phone]

## 2018-07-14 NOTE — Progress Notes (Signed)
Spoke with patient's daughter [name] over the phone as requested. I explained to Susie that her mother will be in the hospital indefinitely for dialysis and antibiotics. As she is not a candidate for outpatient dialysis (per nephrology) at this time and is not a candidate for [facility], she will stay in the hospital. If she is able to demonstrate less disruptive behavior for multiple dialysis sessions, she may be discharged to SNF. I answered all of the family's questions. They plan to meet with the palliative care team this Saturday to continue goals of care discussions.

## 2018-07-14 NOTE — Progress Notes (Signed)
Pharmacy Antibiotic Note  Nancy [name] is a 78 y.o. female admitted on 07/03/2018 with back pain.  Pharmacy consulted for Cefepime for Osteomyelitis. ESRD - HD MWF  Patient remains afebrile and WBC remains normal. Of note, having difficulty arrangin o/p dialysis due to need for sitter. Last HD session was on 1/6 and patient tolerated 4 hours with BFR @ 300.  Plan: Continue Cefepime 2g IV post-HD MWF (6-8 weeks) with stop date of 2/8 (6 weeks) through 2/22 (8 weeks) Monitor HD schedule/toleration and clinical status.   Height: 5\' 3"  (160 cm) Weight: 83 lb 12.4 oz (38 kg) IBW/kg (Calculated) : 52.4  Temp (24hrs), Avg:97.9 F (36.6 C), Min:97.6 F (36.4 C), Max:98.4 F (36.9 C)  Recent Labs  Lab 07/08/18 0727 07/13/18 0737 07/13/18 1406  WBC 4.7 3.2*  --   CREATININE  --   --  5.82*    Estimated Creatinine Clearance: 5 mL/min (A) (by C-G formula based on SCr of 5.82 mg/dL (H)).    Allergies  Allergen Reactions  . Hydrocodone Nausea And Vomiting and Other (See Comments)    Dizziness, also  . Tape Other (See Comments)    TAPE LEAVES DARK PATCHES ON THE SKIN!!   Antimicrobials this admission:  12/28 cefepime>> (6-8 weeks) 12/28 vancomycin >> 12/29  Dose adjustments this admission:   Microbiology results:  12/27 BCx: ngtd 12/27 MRSA pcr negative 12/28 T12 Abscess: Pseudomonas Aeruginosa, pan S   Oscar Hank A. Levada Dy, PharmD, [place] Pager: [phone] Please utilize Amion for appropriate phone number to reach the unit pharmacist ([place])   07/14/2018     8:27 AM

## 2018-07-14 NOTE — Progress Notes (Signed)
Palliative Medicine RN Note: Rec'd a call from patient's nurse that family is at the desk asking to speak with PMT. I called Mrs Burbage daughter [name] [phone]), who states that she has gathered her aunts and are here for a 66 meeting with Judson Roch, our weekend NP.  I confirmed with Judson Roch that she did not schedule a meeting, as this is outside the availability of our providers. I have paged IMTS to ensure the meeting is not with them. During these calls, Susie called me back and said that her aunts will be available to meet at another time. I offered a meeting tomorrow, but they are not available. She specifically asked to re-meet with Romona Curls, NP on Saturday Jan 11 at 1500. I have put this in the PMT sign outs. I did ask Susie if the doctors told her that Mrs Caplin would still be admitted to [facility] at that time, and she replied that she is trying to find that out too.  I spoke with Dr Frederico Hamman, who is evening coverage for IMTS. She will ask Dr Annie Paras to call Susie after she sees Mrs Howk tomorrow.  Marjie Skiff Berline Semrad, RN, BSN, [facility] Palliative Medicine Team 07/14/2018 4:48 PM Office [phone]

## 2018-07-15 ENCOUNTER — Other Ambulatory Visit: Payer: Self-pay | Admitting: *Deleted

## 2018-07-15 DIAGNOSIS — M4645 Discitis, unspecified, thoracolumbar region: Secondary | ICD-10-CM | POA: Diagnosis present

## 2018-07-15 LAB — RENAL FUNCTION PANEL
Albumin: 2 g/dL — ABNORMAL LOW (ref 3.5–5.0)
Anion gap: 12 (ref 5–15)
BUN: 16 mg/dL (ref 8–23)
CO2: 23 mmol/L (ref 22–32)
CREATININE: 5.09 mg/dL — AB (ref 0.44–1.00)
Calcium: 9.5 mg/dL (ref 8.9–10.3)
Chloride: 101 mmol/L (ref 98–111)
GFR calc Af Amer: 9 mL/min — ABNORMAL LOW (ref >60)
GFR calc non Af Amer: 8 mL/min — ABNORMAL LOW (ref >60)
Glucose, Bld: 73 mg/dL (ref 70–99)
Phosphorus: 4.1 mg/dL (ref 2.5–4.6)
Potassium: 4.2 mmol/L (ref 3.5–5.1)
Sodium: 136 mmol/L (ref 135–145)

## 2018-07-15 MED ORDER — DIPHENHYDRAMINE-ZINC ACETATE 2-0.1 % EX CREA
TOPICAL_CREAM | Freq: Three times a day (TID) | CUTANEOUS | Status: DC | PRN
  Administered 2018-07-16: 09:00:00 via TOPICAL
  Filled 2018-07-15: qty 28

## 2018-07-15 MED ORDER — SUCRALFATE 1 GM/10ML PO SUSP
1.0000 g | Freq: Once | ORAL | Status: AC
  Administered 2018-07-15: 1 g via ORAL
  Filled 2018-07-15: qty 10

## 2018-07-15 NOTE — Progress Notes (Signed)
[facility] Progress Note   Subjective:   Seen and examined at bedside.  Complains of back pain.  Yelling out.    Objective Vitals:   07/14/18 1803 07/14/18 2153 07/15/18 0457 07/15/18 0925  BP: (!) 111/56 121/63 118/62 (!) 123/55  Pulse: 99 100 100 95  Resp: 18 18 18 18   Temp: 98.4 F (36.9 C) 98 F (36.7 C) 97.6 F (36.4 C) 97.7 F (36.5 C)  TempSrc: Oral  Oral Oral  SpO2: 100% 100% 100% 100%  Weight:      Height:       Physical Exam General:NAD, chronically ill appearing, frail, agitated female Heart:RRR Lungs:CTAB Abdomen:soft, NTND Extremities:no LE edema Dialysis Access: LU AVF +b/t w/darkened skin over top   [facility] Weights   07/13/18 1230 07/13/18 1547 07/13/18 2107  Weight: 41 kg 40 kg 38 kg    Intake/Output Summary (Last 24 hours) at 07/15/2018 1350 Last data filed at 07/15/2018 0900 Gross per 24 hour  Intake 170 ml  Output 0 ml  Net 170 ml    Additional Objective Labs: Basic Metabolic Panel: Recent Labs  Lab 07/13/18 1406 07/15/18 1156  NA 135 136  K 4.8 4.2  CL 99 101  CO2 20* 23  GLUCOSE 86 73  BUN 18 16  CREATININE 5.82* 5.09*  CALCIUM 9.2 9.5  PHOS 3.7 4.1   Liver Function Tests: Recent Labs  Lab 07/13/18 1406 07/15/18 1156  ALBUMIN 2.1* 2.0*   CBC: Recent Labs  Lab 07/13/18 0737  WBC 3.2*  HGB 9.4*  HCT 32.4*  MCV 96.4  PLT 119*   Studies/Results: No results found.  Medications: . ceFEPime (MAXIPIME) IV 2 g (07/13/18 1439)   . aspirin EC  81 mg Oral Daily  . calcitRIOL  2 mcg Oral Q M,W,F-HD  . Chlorhexidine Gluconate Cloth  6 each Topical Q0600  . darbepoetin (ARANESP) injection - DIALYSIS  60 mcg Intravenous Q Mon-HD  . feeding supplement (NEPRO CARB STEADY)  237 mL Oral BID BM  . feeding supplement (PRO-STAT SUGAR FREE 64)  30 mL Oral BID  . ferric citrate  420 mg Oral TID WC  . heparin  5,000 Units Subcutaneous Q8H  . latanoprost  1 drop Both Eyes QHS  . multivitamin  1 tablet Oral QHS  .  polyethylene glycol  17 g Oral Daily  . senna-docusate  1 tablet Oral BID    Dialysis Orders: [facility] MWF 3.5 hrs 180NRe 400/Autoflow 1.5 45.5 kg 3.0 K/ 2.0 Ca  LUA AVF -No heparin -Mircera 225 mcg IV (Last dose 06/02/2018 Last HGB 10.2 06/22/18) -Calcitriol 0.25 mcg PO TIW (Last PTH 136 06/02/18 Last Phos 8.1 06/22/18)  Assessment/Plan: 1.S/p mechanical fall, osteomyelitis/discitis of T12-L1 w/ventral epidural abscess - On 6-8 week course cefepime - this will be a challenge to implement once d/c due to OP noncompliance w/HD. Per neuro needs repeat MRI prior to d/c of antibiotic therapy. Per primary. PT evaluating.  2. ESRD -On HD MWF. Safety if a big issue. Currently is not a candidate for outpatient dialysis.She is too disruptive and HD is not safe (has pulled out needles, etc).Can reevaluate if appropriate behavior is observed on consecutive treatments. Patient dialysis moved to tomorrow due to no family being available to sit with patient.  Nursing staff reaching out to patient family to find a time tomorrow someone can sit with her to facilitate dialysis.   3. Anemia of CKD-Hgb stable,9.4. Continue ESA. 4. Secondary hyperparathyroidism -Ca and phos at goal. On  auryxia 5. HTN/volume -BP/volume stable. Minimal UF on HD. 6.Malnutrition/FTT - Alb low. Diet liberalized w/fluid restriction. On Nepro/Prostat. Continues to lose weight.  7. Dementia/Agitation/Delirium - Psych eval on 07/09/2018 stated she lacks capacity regarding medical decisions. On Zyprexa for agitation. Daughter is POC. Palliative care consulted. To have another family meeting on Saturday. 8. Hx CVA/AS/mod MS - EF ok 9. Dispo - Poor prognosis w/poor QOL. Patient is not a candidate for OP dialysis and was turned down by LTAC. Family wants patient to remain on dialysis for now, family meeting scheduled on Saturday.  Jen Mow, PA-C [facility] Pager: [phone] 07/15/2018,1:50 PM  LOS:  12 days

## 2018-07-15 NOTE — Progress Notes (Signed)
T/C to pt  grandaughter (unable to reach Daughter) to set up time for dialysis and they sit with her. Grandaughter has to work tomorrow but will call back after she tries to work something out. Susie Cassette

## 2018-07-15 NOTE — Progress Notes (Signed)
Medicine attending: I examined this patient today together with resident physicians Dr. Ina Homes and Vilma Prader and I concur with their evaluation and management plan. No acute clinical change. Lower extremity neurologic exam remains intact. She continues on a prolonged course of parenteral antibiotics for Pseudomonas osteomyelitis/discitis. Decisions around continue dialysis and remain on resolved in view of family dynamics.

## 2018-07-15 NOTE — Progress Notes (Signed)
   Subjective: Patient was asking for a shot for her back this morning. Stated she called the nurse 4-5x overnight for pain medications but did not get anything. Stated she needs a "good bath." She also feels her knee got fluid on it and asked if that could be drained. All questions and concerns were addressed.   Objective: Vital signs in last 24 hours: Vitals:   07/14/18 0819 07/14/18 1803 07/14/18 2153 07/15/18 0457  BP: (!) 117/51 (!) 111/56 121/63 118/62  Pulse: 96 99 100 100  Resp: 18 18 18 18   Temp: 97.8 F (36.6 C) 98.4 F (36.9 C) 98 F (36.7 C) 97.6 F (36.4 C)  TempSrc: Oral Oral  Oral  SpO2: 100% 100% 100% 100%  Weight:      Height:       General: Thin female in no acute distress Neuro: Alert, gross strength 5/5 in the lower extremities.   Assessment/Plan:  Principal Problem:   Acute osteomyelitis of thoracic spine (HCC) Active Problems:   Palliative care encounter   ESRD (end stage renal disease) ([place])   Epidural abscess   Psychomotor agitation  Ms. Wayne is a41 yoF with ESRD on HD MWF, CVA,and HTN presenting with right sided trunk/back pain after a fall, found to have osteomylitis/discitis of T12-L1 with ventral epidural abscess. She was subsequently admitted for further evaluation and management.  Osteomyelitis with ventral epidural abscess -Stable. Afebrile. Plan -Continue cefepime with HD for a 6-8 week course. - Repeat MRI prior to discontinuation of antibiotic therapy per neurosurgery -ContinuePO dilaudid PRN - PT eval and treat  ESRD on HD (MWF) -Last dialyzed 1/6 - Not suitable for outpatient dialysis per nephrology. Based on their most recent note, they will continue to observe her during dialysis. However, she will need to display significantly less disruptive behavior for several consecutive sessions before she could be eligible for outpatient dialysis. - Not eligible for LTACH per case management. - Palliative care held a family  meeting on 1/5. Family wishes to continue dialysis at [facility] are trying to [place] personal caretaker during each dialysis session. Per nephrology, she cannot do HD at SNF. Plan - Family meeting with palliative care is scheduled for Saturday 1/11. - Nephrology following, appreciate recsand placement options  Dementia and Agitation - During her hospitalization, she has been refusing her medications and has had episodes of shouting and agitation.She has not yet required any PRN antipsychotics during this hospitalization. -Found to not have capacity by psychiatry on 1/2. Appreciate recommendation Plan -Zyprexa 2.5mg  PRN for agitation.   Dispo:Awaiting placement at SNF vs LTACH.Nephrologyhas determined that she is not a candidate for outpatient dialysis.Case management has determined that she's not a candidate for LTACH. Will continue to search for placement options.  Ina Homes, MD 07/15/2018, 8:45 AM Pager: [phone]

## 2018-07-15 NOTE — Progress Notes (Addendum)
Spoke with patient daughter regarding having a Actuary for dialysis tomorrow.  No family members are available to sit with the patient.  We will attempt to run patient for her regular treatment without a sitter.  This is not ideal and if safety becomes an issue her treatment will be terminated.  Patient daughter aware. GOC family meeting scheduled on Saturday with palliative care.     Jen Mow, PA-C [facility] Pager: [phone]

## 2018-07-15 NOTE — Patient Outreach (Signed)
[facility]) Care Management  07/15/2018  Nancy [name] Nov 29, 1940 [id number]   Noted that member has been admitted to hospital greater than 10 days.  Per chart, no discharge date planned at this time.  Palliative care meeting with family this weekend.  Per workflow, will close case at this time.  Hospital liaisons notified, request made to place new referral if indicated pending disposition.  [name], [place], MSN [place] [phone]

## 2018-07-16 LAB — RENAL FUNCTION PANEL
Albumin: 1.8 g/dL — ABNORMAL LOW (ref 3.5–5.0)
Anion gap: 10 (ref 5–15)
BUN: 23 mg/dL (ref 8–23)
CO2: 26 mmol/L (ref 22–32)
Calcium: 9.2 mg/dL (ref 8.9–10.3)
Chloride: 99 mmol/L (ref 98–111)
Creatinine, Ser: 6.13 mg/dL — ABNORMAL HIGH (ref 0.44–1.00)
GFR calc Af Amer: 7 mL/min — ABNORMAL LOW (ref >60)
GFR calc non Af Amer: 6 mL/min — ABNORMAL LOW (ref >60)
Glucose, Bld: 104 mg/dL — ABNORMAL HIGH (ref 70–99)
POTASSIUM: 3.8 mmol/L (ref 3.5–5.1)
Phosphorus: 4.2 mg/dL (ref 2.5–4.6)
Sodium: 135 mmol/L (ref 135–145)

## 2018-07-16 LAB — CBC
HCT: 32 % — ABNORMAL LOW (ref 36.0–46.0)
Hemoglobin: 9.5 g/dL — ABNORMAL LOW (ref 12.0–15.0)
MCH: 28.9 pg (ref 26.0–34.0)
MCHC: 29.7 g/dL — ABNORMAL LOW (ref 30.0–36.0)
MCV: 97.3 fL (ref 80.0–100.0)
Platelets: 128 10*3/uL — ABNORMAL LOW (ref 150–400)
RBC: 3.29 MIL/uL — ABNORMAL LOW (ref 3.87–5.11)
RDW: 18.6 % — ABNORMAL HIGH (ref 11.5–15.5)
WBC: 3.6 10*3/uL — ABNORMAL LOW (ref 4.0–10.5)
nRBC: 0 % (ref 0.0–0.2)

## 2018-07-16 MED ORDER — SODIUM CHLORIDE 0.9 % IV SOLN: 100.0000 mL | INTRAVENOUS | Status: DC | PRN

## 2018-07-16 MED ORDER — LEVOFLOXACIN 500 MG PO TABS: 500.0000 mg | ORAL_TABLET | Freq: Every day | ORAL | Status: DC

## 2018-07-16 MED ORDER — HEPARIN SODIUM (PORCINE) 1000 UNIT/ML DIALYSIS: 1000.0000 [IU] | INTRAMUSCULAR | Status: DC | PRN

## 2018-07-16 MED ORDER — CIPROFLOXACIN HCL 500 MG PO TABS
500.0000 mg | ORAL_TABLET | Freq: Every day | ORAL | Status: DC
  Administered 2018-07-16 – 2018-07-29 (×14): 500 mg via ORAL
  Filled 2018-07-16 (×14): qty 1

## 2018-07-16 MED ORDER — ALTEPLASE 2 MG IJ SOLR
2.0000 mg | Freq: Once | INTRAMUSCULAR | Status: DC | PRN
  Filled 2018-07-16: qty 2

## 2018-07-16 MED ORDER — OLANZAPINE 5 MG PO TABS
2.5000 mg | ORAL_TABLET | Freq: Every day | ORAL | Status: DC
  Administered 2018-07-17 – 2018-07-27 (×12): 2.5 mg via ORAL
  Filled 2018-07-16 (×13): qty 1

## 2018-07-16 NOTE — Progress Notes (Signed)
PT Cancellation Note  Patient Details Name: Nancy [name] MRN: [id number] DOB: 06-Dec-1940   Cancelled Treatment:    Reason Eval/Treat Not Completed: Patient at procedure or test/unavailable(Made 2 attempts to see patient who is of floor for HD. Will reattempt treatment again at later date. )  2:12 PM, 07/16/18 Etta Grandchild, PT, DPT Physical Therapist - [place] [phone] (Pager)  [phone] (Office)     [name] C 07/16/2018, 2:12 PM

## 2018-07-16 NOTE — Progress Notes (Signed)
tx terminated with about 1hr 80mins reamaining, states she's ready to go and pulled out arterial needle. MD notified. Pt is alert, well oriented, denies pain, SOB, dizziness. Stable post HD

## 2018-07-16 NOTE — Progress Notes (Addendum)
[facility] Progress Note   Subjective:   Seen and examined at bedside.  Family not present.  No new complaints.  No events noted overnight.   Objective Vitals:   07/15/18 2139 07/16/18 0500 07/16/18 0515 07/16/18 0908  BP: (!) 129/55  125/60 (!) 112/49  Pulse: (!) 103  100 96  Resp: 18  18 16   Temp: 97.6 F (36.4 C)  97.7 F (36.5 C) 97.9 F (36.6 C)  TempSrc:   Oral Oral  SpO2: 100%  100% 100%  Weight: 42 kg 42 kg    Height:       Physical Exam General:NAD, frail, thin, chronically ill appearing elderly female Heart:RRR, +0/2 systolic murmur Lungs:CTAB, BS decreased Abdomen:soft, NTND Extremities:no LE edema Dialysis Access: LU AVF +b/t   Filed Weights   07/13/18 2107 07/15/18 2139 07/16/18 0500  Weight: 38 kg 42 kg 42 kg    Intake/Output Summary (Last 24 hours) at 07/16/2018 1016 Last data filed at 07/16/2018 0928 Gross per 24 hour  Intake 5 ml  Output 0 ml  Net 5 ml    Additional Objective Labs: Basic Metabolic Panel: Recent Labs  Lab 07/13/18 1406 07/15/18 1156  NA 135 136  K 4.8 4.2  CL 99 101  CO2 20* 23  GLUCOSE 86 73  BUN 18 16  CREATININE 5.82* 5.09*  CALCIUM 9.2 9.5  PHOS 3.7 4.1   Liver Function Tests: Recent Labs  Lab 07/13/18 1406 07/15/18 1156  ALBUMIN 2.1* 2.0*   No results for input(s): LIPASE, AMYLASE in the last 168 hours. CBC: Recent Labs  Lab 07/13/18 0737  WBC 3.2*  HGB 9.4*  HCT 32.4*  MCV 96.4  PLT 119*   Blood Culture    Component Value Date/Time   SDES ABSCESS T12 07/04/2018 1626   SPECREQUEST NONE 07/04/2018 1626   CULT  07/04/2018 1626    RARE PSEUDOMONAS AERUGINOSA NO ANAEROBES ISOLATED Performed at [facility], [place] [address], [place], [place] [address]    REPTSTATUS 07/09/2018 FINAL 07/04/2018 1626    Medications: . ceFEPime (MAXIPIME) IV 2 g (07/13/18 1439)   . aspirin EC  81 mg Oral Daily  . calcitRIOL  2 mcg Oral Q M,W,F-HD  . Chlorhexidine Gluconate Cloth  6 each  Topical Q0600  . darbepoetin (ARANESP) injection - DIALYSIS  60 mcg Intravenous Q Mon-HD  . feeding supplement (NEPRO CARB STEADY)  237 mL Oral BID BM  . feeding supplement (PRO-STAT SUGAR FREE 64)  30 mL Oral BID  . ferric citrate  420 mg Oral TID WC  . heparin  5,000 Units Subcutaneous Q8H  . latanoprost  1 drop Both Eyes QHS  . multivitamin  1 tablet Oral QHS  . polyethylene glycol  17 g Oral Daily  . senna-docusate  1 tablet Oral BID    Dialysis Orders: [facility] M[facility] 3.5 hrs 180NRe 400/Autoflow 1.5 45.5 kg 3.0 K/ 2.0 Ca  LUA AVF -No heparin -Mircera 225 mcg IV (Last dose 06/02/2018 Last HGB 10.2 06/22/18) -Calcitriol 0.25 mcg PO TIW (Last PTH 136 06/02/18 Last Phos 8.1 06/22/18)  Assessment/Plan: 1.S/p mechanical fall, osteomyelitis/discitis of T12-L1 w/ventral epidural abscess - On 6-8 week course cefepime - this will be a challenge to implement once d/c due to OP noncompliance w/HD. Per neuro needs repeat MRI prior to d/c of antibiotic therapy. Per primary.PT evaluating. 2. ESRD -On HD MWF.Safety is a big issue. Currently is not a candidate for outpatient dialysis.She is too disruptive and HD is not  safe (has pulled out needles, etc).Canreevaluate if appropriate behavioris observed on consecutive treatments.  Orders written for HD today w/o sitter due to family not being able to sit with patient during her treatment.  3. Anemia of CKD-Hgb stable,9.4. Continue ESA. 4. Secondary hyperparathyroidism -Ca and phos at goal. On auryxia 5. HTN/volume -BP/volume stable. Minimal UF on HD. 6.Malnutrition/FTT - Alb low. Diet liberalized w/fluid restriction. On Nepro/Prostat. Continues to lose weight.  7. Dementia/Agitation/Delirium - Psych eval on 07/09/2018 stated she lacks capacity regarding medical decisions. On Zyprexa for agitation. Daughter is POC. Palliative care consulted. To have another family meeting on Saturday. 8. Hx CVA/AS/mod MS - EF ok 9. Dispo - Poor  prognosis w/poor QOL. Patient is not a candidate for OP dialysis and was turned down by LTAC. Family hoping pt can still take dialysis.  Family / PMT meeting scheduled this Saturday at 3 pm.  Would be best to transition to comfort care in setting of advanced dementia and behavior issues on ESRD.   Jen Mow, PA-C [facility] Pager: [phone] 07/16/2018,10:16 AM  LOS: 13 days   Pt seen, examined and agree w A/P as above.  Kelly Splinter MD [employer] pager [phone]   07/16/2018, 1:15 PM

## 2018-07-16 NOTE — Progress Notes (Signed)
   Subjective: Nancy [name] seen laying comfortably in bed. She reports back pain and a headache. She states needs an Environmental consultant eating. When told that she will be getting dialysis today, she replies "I know."   Objective:  Vital signs in last 24 hours: Vitals:   07/15/18 0925 07/15/18 2139 07/16/18 0500 07/16/18 0515  BP: (!) 123/55 (!) 129/55  125/60  Pulse: 95 (!) 103  100  Resp: 18 18  18   Temp: 97.7 F (36.5 C) 97.6 F (36.4 C)  97.7 F (36.5 C)  TempSrc: Oral   Oral  SpO2: 100% 100%  100%  Weight:  42 kg 42 kg   Height:       General: Thin female in no acute distress CV: systolic and diastolic murmur Pulm: CTAB. Neuro: Alert, gross strength 5/5 in the lower extremities.   Assessment/Plan:  Principal Problem:   Acute osteomyelitis of thoracic spine (HCC) Active Problems:   Palliative care encounter   ESRD (end stage renal disease) ([place])   Epidural abscess   Psychomotor agitation   Discitis of thoracolumbar region  Nancy [name] is a54 yoF with ESRD on HD MWF, CVA,and HTN presenting with right sided trunk/back pain after a fall, found to have osteomylitis/discitis of T12-L1 with ventral epidural abscess. She was subsequently admitted for further evaluation and management.  Osteomyelitis with ventral epidural abscess -Stable. Afebrile. Plan -Continue cefepime with HD for a 6-8 week course. - Repeat MRI prior to discontinuation of antibiotic therapy per neurosurgery -ContinuePO dilaudid PRN - PT eval and treat  ESRD on HD (MWF) -Last dialyzed 1/6 - HD delayed from yesterday to today in an attempt to have a family member sit with the patient during dialysis. No family available, so patient will undergo HD today without accompaniment. Nephrology will monitor for appropriate behavior. Unless her behavior improves during HD, nephrology recommends termination of dialysis and transition to hospice.  As of now, family would like for the patient to continue on  dialysis. Plan - HD today - Family meeting with palliative care is scheduled for Saturday 1/11. - Nephrology following, appreciate recsand placement options  Dementia and Agitation - During her hospitalization, she has been refusing her medications and has had episodes of shouting and agitation.She has not yet required any PRN antipsychotics during this hospitalization. -Found to not have capacity by psychiatry on 1/2. Appreciate recommendation Plan -Zyprexa 2.5mg  PRN for agitation.   Dispo:Nephrologyhas determined that she is not a candidate for outpatient dialysis.Case management has determined that she's not a candidate for LTACH. Unable to discharge to SNF, LTACH, or home. Discharge pending improvement in behavior during HD or transition to hospice.  Nancy [name], [name], MD 07/16/2018, 6:44 AM Pager: [phone]

## 2018-07-16 NOTE — Progress Notes (Signed)
Medicine attending: I personally examined this patient today together with resident physician Dr Vilma Prader and I concur with her evaluation and management plan. With elbow are making progress with her intermittent agitation.  She has been fairly calm for the last 3 days.  However when she went to dialysis today she again acted out and pulled out all of her intravenous lines and terminated the dialysis session.  She now has no access for parenteral antibiotics.  Impression: 1.  Pseudomonas epidural abscess/osteomyelitis at T12-L1 No dialysis yesterday so she has had a total of 10 days of parenteral antibiotics.  She continues to pull out her intravenous lines.  Poor vascular access to begin with. At this point I am recommending that we changed to oral Levaquin 500 mg daily.  This has good Pseudomonas coverage and is well absorbed orally.  Although there may be some small risk for C. difficile colitis, under the circumstances it is the best option that we have.  2.  End-stage renal disease on dialysis Limited mental capacity but she clearly does not want to continue dialysis despite family pressure to do so.  I think we have reached a roadblock.  She is not a candidate for outpatient dialysis and was turned down by LTAC.  A family meeting is scheduled for this Saturday.  I hope we can come to some consensus.  This has not happened following previous meetings.  3.  Dementia with behavioral disturbance. At this point I would like to try a standing daily low-dose of Zyprexa 2.5 mg.

## 2018-07-17 ENCOUNTER — Telehealth: Payer: Self-pay | Admitting: *Deleted

## 2018-07-17 NOTE — Progress Notes (Signed)
   Subjective:   She was seen laying comfortably in bed with no acute concerns. She received her first dose of Zyprexa around 1am and was visibly more calm and reserved this morning. Patient pulled out her IV during HD yesterday, but she would not speak to why she did this.  It was explained to her that if she does not comply with HD, she will progress to end of life.   Objective:  Vital signs in last 24 hours: Vitals:   07/16/18 1430 07/16/18 1742 07/17/18 0016 07/17/18 0605  BP: (!) 106/53 (!) 98/49 (!) 118/56 120/60  Pulse: (!) 102 97 (!) 101 98  Resp: 18 16 18 18   Temp: 98.2 F (36.8 C) 98 F (36.7 C) 97.6 F (36.4 C) 97.8 F (36.6 C)  TempSrc: Oral Oral  Oral  SpO2: 98% 100% 100% 100%  Weight:      Height:       General: laying in bed comfortably, NAD Lungs: CTA bilaterally Neuro: strength 5/5 bilateral LE  Assessment/Plan:  Principal Problem:   Acute osteomyelitis of thoracic spine (HCC) Active Problems:   Palliative care encounter   ESRD (end stage renal disease) ([place])   Epidural abscess   Psychomotor agitation   Discitis of thoracolumbar region  Nancy [name] is a28 yoF with ESRD on HD MWF, CVA,and HTN presenting with right sided trunk/back pain after a fall, found to have osteomylitis/discitis of T12-L1 with ventral epidural abscess. She was subsequently admitted for further evaluation and management.  Osteomyelitis with ventral epidural abscess -Stable. Afebrile. - Patient unable to tolerate HD and IV placement. Transitioned to PO antibiotic.  Plan -Cipro 500mg  daily - Repeat MRI prior to discontinuation of antibiotic therapy per neurosurgery -ContinuePO dilaudid PRN - PT eval and treat  ESRD on HD (MWF) -Unable to finish session of dialysis yesterday as she pulled out the arterial needle - Nephrology likely to recommend discontinuation of dialysis and transition to hospice care. If so, this decision will need to be discussed with her family as they  have wanted her to continue dialysis.  Plan -Family meeting with palliative care is scheduled for Saturday 1/11. - Nephrology following, appreciate recs  Dementia and Agitation - Dementia with behavioral disturbances, particularly during dialysis.  -Found to not have capacity by psychiatry on 1/2. Appreciate recommendation Plan -Scheduled Zyprexa 2.5mg  qhs  Dispo:Discharge pending improvement in behavior during HD or transition to hospice per nephrology recommendations.  Nancy [name], [name], MD 07/17/2018, 6:26 AM Pager: [phone]

## 2018-07-17 NOTE — Addendum Note (Signed)
Addended by: Hulan Fray on: 07/17/2018 06:48 PM   Modules accepted: Orders

## 2018-07-17 NOTE — Progress Notes (Signed)
PT Cancellation Note  Patient Details Name: Nancy [name] MRN: [id number] DOB: Oct 02, 1940   Cancelled Treatment:    Reason Eval/Treat Not Completed: Other (comment). Medical team recommending transition to comfort care on 1/10 with family meeting scheduled 1/11. Will sign off at this time and DC PT services. If plan of care changes and PT services are warranted, please place new order.   1:19 PM, 07/17/18 Etta Grandchild, PT, DPT Physical Therapist - [place] [phone] (Pager)  [phone] (Office)     [name] C 07/17/2018, 1:18 PM

## 2018-07-17 NOTE — Progress Notes (Signed)
Medicine attending: I examined this patient today together with resident physician Dr Vilma Prader and I concur with her evaluation and management plan. She was started on a daily low-dose of Zyprexa early today.  She is calm and cooperative this morning.  No acute change in exam.  Motor strength lower extremities remains normal.  She is not complaining of back pain at rest. I had a frank discussion with her this morning about dialysis.  I gave her the option/permission of discontinuing but also advised her that if she wants to continue she will have to follow the protocol in the dialysis unit and cannot pull out the dialysis needles.  I believe she understood our discussion. I am hoping that the Zyprexa helps keep her on a more even temperament. Impression: 1.T12-L1 osteomyelitis/epidural abscess We changed her to oral Cipro yesterday when she removed all of her IV lines.  She received 10 days of parenteral antibiotics prior to this. 2.  End-stage renal disease on dialysis 3.  Psychomotor agitation. Tentative family meeting tomorrow.

## 2018-07-17 NOTE — Progress Notes (Addendum)
[facility] Progress Note   Subjective:   Seen and examined at bedside.  States she is feeling well.  Denies pulling out her arterial needle during HD yesterday.  Discussed importance of not pulling out her needles in dialysis as it could cause death.    Objective Vitals:   28-Jul-2018 1742 07/17/18 0016 07/17/18 0605 07/17/18 0849  BP: (!) 98/49 (!) 118/56 120/60 (!) 106/46  Pulse: 97 (!) 101 98 90  Resp: 16 18 18 16   Temp: 98 F (36.7 C) 97.6 F (36.4 C) 97.8 F (36.6 C) 97.7 F (36.5 C)  TempSrc: Oral  Oral Oral  SpO2: 100% 100% 100% 100%  Weight:      Height:       Physical Exam General:NAD, chronically ill appearing, frail, thin female Heart:RRR Lungs:CTAB, BS decreased Abdomen:soft, NTND Extremities:trace LE edema Dialysis Access: LU AVF +b/t   Filed Weights   07/15/18 2139 07/28/18 0500 Jul 28, 2018 1244  Weight: 42 kg 42 kg 40 kg    Intake/Output Summary (Last 24 hours) at 07/17/2018 1200 Last data filed at 07/17/2018 1146 Gross per 24 hour  Intake 350 ml  Output -119 ml  Net 469 ml    Additional Objective Labs: Basic Metabolic Panel: Recent Labs  Lab 07/13/18 1406 07/15/18 1156 07/28/18 1058  NA 135 136 135  K 4.8 4.2 3.8  CL 99 101 99  CO2 20* 23 26  GLUCOSE 86 73 104*  BUN 18 16 23   CREATININE 5.82* 5.09* 6.13*  CALCIUM 9.2 9.5 9.2  PHOS 3.7 4.1 4.2   Liver Function Tests: Recent Labs  Lab 07/13/18 1406 07/15/18 1156 2018-07-28 1058  ALBUMIN 2.1* 2.0* 1.8*   CBC: Recent Labs  Lab 07/13/18 0737 July 28, 2018 1058  WBC 3.2* 3.6*  HGB 9.4* 9.5*  HCT 32.4* 32.0*  MCV 96.4 97.3  PLT 119* 128*   Medications: . sodium chloride    . sodium chloride    . sodium chloride    . sodium chloride     . aspirin EC  81 mg Oral Daily  . calcitRIOL  2 mcg Oral Q M,W,F-HD  . Chlorhexidine Gluconate Cloth  6 each Topical Q0600  . ciprofloxacin  500 mg Oral Daily  . darbepoetin (ARANESP) injection - DIALYSIS  60 mcg Intravenous Q Mon-HD   . feeding supplement (NEPRO CARB STEADY)  237 mL Oral BID BM  . feeding supplement (PRO-STAT SUGAR FREE 64)  30 mL Oral BID  . ferric citrate  420 mg Oral TID WC  . heparin  5,000 Units Subcutaneous Q8H  . latanoprost  1 drop Both Eyes QHS  . multivitamin  1 tablet Oral QHS  . OLANZapine  2.5 mg Oral QHS  . polyethylene glycol  17 g Oral Daily  . senna-docusate  1 tablet Oral BID    Dialysis Orders: [facility] M[facility] 3.5 hrs 180NRe 400/Autoflow 1.5 45.5 kg 3.0 K/ 2.0 Ca  LUA AVF -No heparin -Mircera 225 mcg IV (Last dose 06/02/2018 Last HGB 10.2 06/22/18) -Calcitriol 0.25 mcg PO TIW (Last PTH 136 06/02/18 Last Phos 8.1 06/22/18)  Assessment/Plan: 1.S/p mechanical fall, osteomyelitis/discitis of T12-L1 w/ventral epidural abscess - On 6-8 week course cefepime - this will be a challenge to implement once d/c due to OP noncompliance w/HD. Per neuro needs repeat MRI prior to d/c of antibiotic therapy. Per primary.PT evaluating. 2. ESRD -On HD MWF.Safety is a big issue, pulled out her arterial needle during HD yesterday.  Needs family member as Actuary during inpatient  HD, but family has been unavailable. No orders for HD at this time.  Labs and volume stable. Family meeting scheduled for tomorrow, plan to discuss whether to continue dialysis and will make treatment decisions following conversation.   3. Anemia of CKD-Hgb stable,9.5. Continue ESA. 4. Secondary hyperparathyroidism -Ca and phos at goal. On auryxia 5. HTN/volume -BP/volume stable. Minimal UF on HD. 6.Malnutrition/FTT - Alb low. Diet liberalized w/fluid restriction. On Nepro/Prostat. Continues to lose weight.  7. Dementia/Agitation/Delirium - Psych eval on 07/09/2018 stated she lacks capacity regarding medical decisions. On Zyprexa for agitation. Daughter is POC. Palliative care consulted. To have another family meeting on Saturday. 8. Hx CVA/AS/mod MS - EF ok 9. Dispo - Poor prognosis w/poor QOL. Patient is not a  candidate for OP dialysis and was turned down by LTAC. Family hoping pt can still take dialysis.  Family / PMT meeting scheduled this Saturday at 3 pm.  Would be best to transition to comfort care in setting of advanced dementia and behavior issues on HD.   Jen Mow, PA-C [facility] Pager: [phone] 07/17/2018,12:00 PM  LOS: 14 days   Pt seen, examined and agree w A/P as above.  Kelly Splinter MD [employer] pager [phone]   07/17/2018, 12:20 PM

## 2018-07-17 NOTE — Clinical Social Work Note (Signed)
CSW reviewed notes and nephrology is continuing to state - "Patient is not a candidate for OP dialysis and was turned down by LTAC". A family meeting has been scheduled. A skilled facility had been selected and insurance auth received, however due to nephrology's statement that patient no longer a candidate for outpatient dialysis, the facility was advised on 1/7 regarding patient not being able to receive outpatient dialysis. CSW will sign of at this time, however please reconsult if any SW intervention services needed before patient's discharge.  Charlsie Fleeger Givens, MSW, LCSW Licensed Clinical Social Worker [place] [phone]

## 2018-07-17 NOTE — Telephone Encounter (Signed)
SPOKE WITH DAUGHTER MOTHER IS STILL IN A FACILITY / BUT AT THE MOMENT SHE IS [place]. PER DAUGHTER TO CANCEL THIS REFERRAL. PATIENT HAS APPOINTMENT 07-28-2018 WITH DR NARENDRA, IF NEED BE SHE WILL REQUEST A NEW DERM REFERRAL.

## 2018-07-18 LAB — GLUCOSE, CAPILLARY: Glucose-Capillary: 103 mg/dL — ABNORMAL HIGH (ref 70–99)

## 2018-07-18 NOTE — Progress Notes (Addendum)
[facility] Progress Note   Subjective:   Seen and examined at bedside.  No new complaints.  Family to have meeting with palliative care today to discuss [place].    Objective Vitals:   07/17/18 1717 07/17/18 2228 07/18/18 0642 07/18/18 0920  BP: 90/69 (!) 108/44 (!) 111/49 (!) 98/57  Pulse: 93 89 93 95  Resp: 16 18 16 19   Temp: 97.8 F (36.6 C) 97.7 F (36.5 C) 97.8 F (36.6 C) 97.7 F (36.5 C)  TempSrc: Oral  Oral Oral  SpO2: 100% 100% 100% 100%  Weight:      Height:       Physical Exam General:NAD, chronically ill appearing, frail, elderly female NUUVO:+5/3 systolic murmur, RRR Lungs:CTAB anteriorly Extremities:trace LE edema Dialysis Access: LU AVF +b/t   Filed Weights   07/15/18 2139 07/16/18 0500 07/16/18 1244  Weight: 42 kg 42 kg 40 kg    Intake/Output Summary (Last 24 hours) at 07/18/2018 1148 Last data filed at 07/18/2018 0900 Gross per 24 hour  Intake 360 ml  Output 0 ml  Net 360 ml    Additional Objective Labs: Basic Metabolic Panel: Recent Labs  Lab 07/13/18 1406 07/15/18 1156 07/16/18 1058  NA 135 136 135  K 4.8 4.2 3.8  CL 99 101 99  CO2 20* 23 26  GLUCOSE 86 73 104*  BUN 18 16 23   CREATININE 5.82* 5.09* 6.13*  CALCIUM 9.2 9.5 9.2  PHOS 3.7 4.1 4.2   Liver Function Tests: Recent Labs  Lab 07/13/18 1406 07/15/18 1156 07/16/18 1058  ALBUMIN 2.1* 2.0* 1.8*   CBC: Recent Labs  Lab 07/13/18 0737 07/16/18 1058  WBC 3.2* 3.6*  HGB 9.4* 9.5*  HCT 32.4* 32.0*  MCV 96.4 97.3  PLT 119* 128*   Medications: . sodium chloride    . sodium chloride    . sodium chloride    . sodium chloride     . aspirin EC  81 mg Oral Daily  . calcitRIOL  2 mcg Oral Q M,W,F-HD  . Chlorhexidine Gluconate Cloth  6 each Topical Q0600  . ciprofloxacin  500 mg Oral Daily  . darbepoetin (ARANESP) injection - DIALYSIS  60 mcg Intravenous Q Mon-HD  . feeding supplement (NEPRO CARB STEADY)  237 mL Oral BID BM  . feeding supplement (PRO-STAT  SUGAR FREE 64)  30 mL Oral BID  . ferric citrate  420 mg Oral TID WC  . heparin  5,000 Units Subcutaneous Q8H  . latanoprost  1 drop Both Eyes QHS  . multivitamin  1 tablet Oral QHS  . OLANZapine  2.5 mg Oral QHS  . polyethylene glycol  17 g Oral Daily  . senna-docusate  1 tablet Oral BID    Dialysis Orders: [facility] MWF 3.5 hrs 180NRe 400/Autoflow 1.5 45.5 kg 3.0 K/ 2.0 Ca  LUA AVF -No heparin -Mircera 225 mcg IV (Last dose 06/02/2018 Last HGB 10.2 06/22/18) -Calcitriol 0.25 mcg PO TIW (Last PTH 136 06/02/18 Last Phos 8.1 06/22/18)  Assessment/Plan: 1.S/p mechanical fall, osteomyelitis/discitis of T12-L1 w/ventral epidural abscess - On 6-8 week course cefepime - this will be a challenge to implement once d/c due to OP noncompliance w/HD. Per neuro needs repeat MRI prior to d/c of antibiotic therapy. Per primary.PT evaluating. 2. ESRD -On HD MWF.Safety isa big issue, she pulled out arterial needle during her last HD.  Needs family member as Actuary during inpatient HD, but family has been unavailable. No orders for HD at this time.  Labs and volume  stable. Family meeting scheduled for today with palliative care for [place].  She is currently not a candidate for OP dialysis due to behavior and safety concerns. 3. Anemia of CKD-Hgb stable,last 9.5. Continue ESA. 4. Secondary hyperparathyroidism -Ca and phos at goal. On auryxia 5. HTN/volume -BP/volume stable. Minimal UF on HD. 6.Malnutrition/FTT - Alb low. Diet liberalized w/fluid restriction. On Nepro/Prostat. Continues to lose weight.  7. Dementia/Agitation/Delirium - Psych eval on 07/09/2018 stated she lacks capacity regarding medical decisions. On Zyprexa for agitation. Daughter is POC. Palliative care consulted. To have another family meeting on Saturday. 8. Hx CVA/AS/mod MS - EF ok 9. Dispo - Poor prognosis w/poor QOL. Patient is not a candidate for OP dialysis and was turned down by LTAC. Familyhoping pt can still take  dialysis. Family / PMT meeting scheduledthisSaturdayat 3 pm.Would be best to transition to comfort care in setting of advanced dementia and behavior issues on HD.  Jen Mow, PA-C [facility] Pager: [phone] 07/18/2018,11:48 AM  LOS: 15 days   Pt seen, examined and agree w A/P as above.  Kelly Splinter MD [employer] pager [phone]   07/18/2018, 1:06 PM

## 2018-07-18 NOTE — Progress Notes (Signed)
Internal Medicine Attending:   I saw and examined the patient. I reviewed Dr Dorrell's note and I agree with the resident's findings and plan as documented in the resident's note.  As noted by Dr. Jiles Garter back pain has improved she states that she wants to go home we discussed with her that palliative care is organizing a family meeting today.  Agree with continuing Cipro as she no longer has IV access.

## 2018-07-18 NOTE — Progress Notes (Signed)
   Subjective: No overnight events. Patient reports her back pain is still present, but has improved. She reiterates that she would like to go home. The patient was informed that her family will be coming today for a family meeting with palliative care. She was advised to explain her wishes to her family.   Objective:  Vital signs in last 24 hours: Vitals:   07/17/18 0605 07/17/18 0849 07/17/18 1717 07/17/18 2228  BP: 120/60 (!) 106/46 90/69 (!) 108/44  Pulse: 98 90 93 89  Resp: 18 16 16 18   Temp: 97.8 F (36.6 C) 97.7 F (36.5 C) 97.8 F (36.6 C) 97.7 F (36.5 C)  TempSrc: Oral Oral Oral   SpO2: 100% 100% 100% 100%  Weight:      Height:       Gen: frail woman, no distress CV: diastolic and systolic murmurs Ext: 5/5 strength BLE. No edema.  Assessment/Plan:  Principal Problem:   Acute osteomyelitis of thoracic spine (HCC) Active Problems:   Palliative care encounter   ESRD (end stage renal disease) ([place])   Epidural abscess   Psychomotor agitation   Discitis of thoracolumbar region  Osteomyelitis with ventral epidural abscess -Stable. Afebrile. - Patient unable to tolerate HD and IV placement. Transitioned to PO antibiotic.  Plan -Cipro 500mg  daily - Repeat MRI prior to discontinuation of antibiotic therapy per neurosurgery -ContinuePO dilaudid PRN - PT eval and treat  ESRD on HD (MWF) -Unable to finish session of dialysis yesterday as she pulled out the arterial needle -Nephrology likely to recommend discontinuation of dialysis and transition to hospice care. If so, this decision will need to be discussed with her family as they have wanted her to continue dialysis.  Plan -Family meeting with palliative care today - Nephrology following, appreciate recs. No future dialysis sessions scheduled at this time.  Dementia and Agitation - Dementia with behavioral disturbances, particularly during dialysis.  -Found to not have capacity by psychiatry on 1/2.  Appreciate recommendation - Appears calmer with scheduled Zyprexa. Plan -Scheduled Zyprexa 2.5mg  qhs  Dispo:Discharge pending improvement in behavior during HD or transition to hospice. Ongoing [place] discussions with the family.  Geni Skorupski, [name], MD 07/18/2018, 6:27 AM Pager: [phone]

## 2018-07-18 NOTE — Progress Notes (Addendum)
Patient seen, chart reviewed.  2 of patient's sisters, Mrs. Brayton El and Mrs. Jessee Avers in addition to her daughter [name] and granddaughter [name], nephrologist, Dr. Jonnie Finner for family meeting today at 3 PM.  Multiple issues addressed in terms of dialysis.  Dr. Jonnie Finner did candidly share with family that it was pt's physician recommendation that she stop dialysis because of underlying medical issues; now with more potential  risk than benefit.   Family is struggling with feeling like it is their decision to stop versus her underlying dementia and health issues that is impacting her ability to continue HD safely.  Short term plan though is to try and continue HD as long as family provides 1:1 safety support and that this is effective. Dr. Jonnie Finner scheduled HD 1/12 for 0900 and is willing to try. Pt's daughter to come to the hospital prior to 0900 and go with her. They would like to see if placement at [facility] can be revisited and a Tues, Thurs, Sat HD schedule be arranged at the facility. Daughter, [name] and granddaughter [name] to split those 3 days. They understand that without 1:1 pt will not be able to go forward with HD in any setting  The plan is not for her to return home at this point. They wish to continue with abx and adequate pain control so that she can tolerate HD   Will place SW consult to see if [facility] specifically can be arranged.  Palliative medicine to remain involved and monitor plan  Thank you, Romona Curls, NP Palliative Medicine  Time spent: 60 min Greater than 50% of visit spent counseling and coordination of care

## 2018-07-19 LAB — CBC
HCT: 31.9 % — ABNORMAL LOW (ref 36.0–46.0)
Hemoglobin: 9.3 g/dL — ABNORMAL LOW (ref 12.0–15.0)
MCH: 28.5 pg (ref 26.0–34.0)
MCHC: 29.2 g/dL — AB (ref 30.0–36.0)
MCV: 97.9 fL (ref 80.0–100.0)
Platelets: 116 10*3/uL — ABNORMAL LOW (ref 150–400)
RBC: 3.26 MIL/uL — ABNORMAL LOW (ref 3.87–5.11)
RDW: 19 % — ABNORMAL HIGH (ref 11.5–15.5)
WBC: 1.8 10*3/uL — ABNORMAL LOW (ref 4.0–10.5)
nRBC: 0 % (ref 0.0–0.2)

## 2018-07-19 LAB — RENAL FUNCTION PANEL
ALBUMIN: 1.9 g/dL — AB (ref 3.5–5.0)
Anion gap: 8 (ref 5–15)
BUN: 19 mg/dL (ref 8–23)
CO2: 28 mmol/L (ref 22–32)
Calcium: 8.7 mg/dL — ABNORMAL LOW (ref 8.9–10.3)
Chloride: 101 mmol/L (ref 98–111)
Creatinine, Ser: 4.65 mg/dL — ABNORMAL HIGH (ref 0.44–1.00)
GFR calc Af Amer: 10 mL/min — ABNORMAL LOW (ref >60)
GFR calc non Af Amer: 9 mL/min — ABNORMAL LOW (ref >60)
Glucose, Bld: 91 mg/dL (ref 70–99)
Phosphorus: 2.7 mg/dL (ref 2.5–4.6)
Potassium: 3.6 mmol/L (ref 3.5–5.1)
Sodium: 137 mmol/L (ref 135–145)

## 2018-07-19 MED ORDER — HEPARIN SODIUM (PORCINE) 1000 UNIT/ML DIALYSIS: 1000.0000 [IU] | INTRAMUSCULAR | Status: DC | PRN

## 2018-07-19 MED ORDER — SODIUM CHLORIDE 0.9 % IV SOLN: 100.0000 mL | INTRAVENOUS | Status: DC | PRN

## 2018-07-19 MED ORDER — CHLORHEXIDINE GLUCONATE CLOTH 2 % EX PADS: 6.0000 | MEDICATED_PAD | Freq: Every day | CUTANEOUS | Status: DC

## 2018-07-19 MED ORDER — LIDOCAINE HCL (PF) 1 % IJ SOLN: 5.0000 mL | INTRAMUSCULAR | Status: DC | PRN

## 2018-07-19 MED ORDER — LIDOCAINE-PRILOCAINE 2.5-2.5 % EX CREA: 1.0000 "application " | TOPICAL_CREAM | CUTANEOUS | Status: DC | PRN

## 2018-07-19 MED ORDER — PENTAFLUOROPROP-TETRAFLUOROETH EX AERO: 1.0000 "application " | INHALATION_SPRAY | CUTANEOUS | Status: DC | PRN

## 2018-07-19 MED ORDER — ALTEPLASE 2 MG IJ SOLR: 2.0000 mg | Freq: Once | INTRAMUSCULAR | Status: DC | PRN

## 2018-07-19 MED ORDER — DARBEPOETIN ALFA 60 MCG/0.3ML IJ SOSY
60.0000 ug | PREFILLED_SYRINGE | INTRAMUSCULAR | Status: DC
  Administered 2018-07-21: 60 ug via INTRAVENOUS
  Filled 2018-07-19 (×2): qty 0.3

## 2018-07-19 NOTE — Progress Notes (Addendum)
[facility] Progress Note   Subjective:   Seen and examined at bedside.  No new complaints. Not happy about having her dialysis days changed to TTS schedule.    Objective Vitals:   07/18/18 1936 07/18/18 2031 07/19/18 0559 07/19/18 0900  BP: (!) 70/51 (!) 98/46 (!) 104/44 (!) 100/45  Pulse: 100  91 93  Resp: 16  16 14   Temp: 97.8 F (36.6 C)  (!) 97.4 F (36.3 C) 97.8 F (36.6 C)  TempSrc: Oral  Oral Oral  SpO2: 93%  100% 100%  Weight:      Height:       Physical Exam General:NAD, chroncially ill appearing, frail, elderly female, laying in bed Heart:RRR, +4/0 systolic murmur Lungs:CTAB anteriorly Abdomen:soft NTND Extremities:no LE edema Dialysis Access: LU AVF +b/t   Filed Weights   07/15/18 2139 07/16/18 0500 07/16/18 1244  Weight: 42 kg 42 kg 40 kg    Intake/Output Summary (Last 24 hours) at 07/19/2018 1119 Last data filed at 07/19/2018 0900 Gross per 24 hour  Intake 440 ml  Output -  Net 440 ml   Medications: . sodium chloride    . sodium chloride    . sodium chloride    . sodium chloride     . aspirin EC  81 mg Oral Daily  . calcitRIOL  2 mcg Oral Q M,W,F-HD  . Chlorhexidine Gluconate Cloth  6 each Topical Q0600  . ciprofloxacin  500 mg Oral Daily  . darbepoetin (ARANESP) injection - DIALYSIS  60 mcg Intravenous Q Mon-HD  . feeding supplement (NEPRO CARB STEADY)  237 mL Oral BID BM  . feeding supplement (PRO-STAT SUGAR FREE 64)  30 mL Oral BID  . ferric citrate  420 mg Oral TID WC  . heparin  5,000 Units Subcutaneous Q8H  . latanoprost  1 drop Both Eyes QHS  . multivitamin  1 tablet Oral QHS  . OLANZapine  2.5 mg Oral QHS  . polyethylene glycol  17 g Oral Daily  . senna-docusate  1 tablet Oral BID    Dialysis Orders: [facility] M[facility] 3.5 hrs 180NRe 400/Autoflow 1.5 45.5 kg 3.0 K/ 2.0 Ca  LUA AVF -No heparin -Mircera 225 mcg IV (Last dose 06/02/2018 Last HGB 10.2 06/22/18) -Calcitriol 0.25 mcg PO TIW (Last PTH 136 06/02/18 Last Phos 8.1  06/22/18)  Assessment/Plan: 1.S/p mechanical fall, osteomyelitis/discitis of T12-L1 w/ventral epidural abscess - On 6-8 week course cefepime - this will be a challenge to implement once d/c due to OP noncompliance w/HD. Per neuro needs repeat MRI prior to d/c of antibiotic therapy. Per primary.PT evaluating. 2. ESRD -On HD MWF.Safety isa big issue, patient is disruptive and pulled out her arterial needle during last treatment.Family would like to continue dialysis for now.  They have agreed to sit with patient 1:1 during inpatient HD on TTS schedule, 1st shift preferably.  She remians not a candidate for OP dialysis due to behavior and safety issues during dialysis, if there is a large improvement in her behavior for consecutive treatments, it can be readdressed.  3. Anemia of CKD-Hgb stable,last 9.5. Continue ESA.Checking labs today. 4. Secondary hyperparathyroidism -Ca and phos at goal. On [name]. Checking labs today. 5. HTN/volume -BP/volume stable. Minimal UF on HD. 6.Malnutrition/FTT - Alb low. Diet liberalized w/fluid restriction. On Nepro/Prostat. Continues to lose weight.  7. Dementia/Agitation/Delirium - Psych eval on 07/09/2018 stated she lacks capacity regarding medical decisions. On Zyprexa for agitation. Daughter is POC. Palliative care consulted. 8. Hx CVA/AS/mod MS - EF  ok 9. Dispo - Poor prognosis w/poor QOL. Would be best to transition to comfort care in setting of advanced dementia and behavior issues onHD.  Patient is not a candidate for outpatient dialysis due to behavior/safety concerns.  Family meeting on 07/18/18 w/ palliative & Dr. Jonnie Finner, family would like to continue dialysis for now and commit to being present for all inpatient HD.   Would like to see if patient could be accepted at [facility].    Jen Mow, PA-C [facility] Pager: [phone] 07/19/2018,11:19 AM  LOS: 16 days   Pt seen, examined and agree w A/P as above.  Kelly Splinter MD [employer] pager [phone]   07/19/2018, 1:27 PM

## 2018-07-19 NOTE — Progress Notes (Signed)
Patient remained calm and cooperative throughout dialysis  treatment with family at bedside.

## 2018-07-19 NOTE — Progress Notes (Signed)
   Subjective: Patient is doing well this AM. She is still having some back pain and would like to get out of bed. She otherwise feels fine and has no questions/concerns. We discussed the plan to continue antibiotics, pain management, and HD. She voices understanding.   Objective: Vital signs in last 24 hours: Vitals:   07/18/18 0920 07/18/18 1936 07/18/18 2031 07/19/18 0559  BP: (!) 98/57 (!) 70/51 (!) 98/46 (!) 104/44  Pulse: 95 100  91  Resp: 19 16  16   Temp: 97.7 F (36.5 C) 97.8 F (36.6 C)  (!) 97.4 F (36.3 C)  TempSrc: Oral Oral  Oral  SpO2: 100% 93%  100%  Weight:      Height:       General: Thin elderly female in no acute distress  Neuro: Alert. Gross strength 5/5 in all extremities. Sensation to light touch intact in the bilateral LEs.  Assessment/Plan:  Osteomyelitis with ventral epidural abscess -Stable. Afebrile. - Patient unable to tolerate HD and IV placement. Transitioned to PO antibiotic. Plan -Cipro 500mg  daily - Repeat MRI prior to discontinuation of antibiotic therapy per neurosurgery -ContinuePO dilaudid PRN - PT eval and treat  ESRD on HD (MWF) -Family discussion yesterday. Will continue to try HD with family present.  Plan -Appreciate nephrology and palliative care meeting with the family  - Plan for HD today   Dementia and Agitation -Dementia with behavioral disturbances, particularly during dialysis. -Found to not have capacity by psychiatry on 1/2. Appreciate recommendation - Appears calmer with scheduled Zyprexa. Plan -ScheduledZyprexa 2.5mg qhs  Dispo:Family would like to continue HD and commit to being present at every HD session. Would like to attempt to have the patient placed at [facility].   Ina Homes, MD 07/19/2018, 8:12 AM

## 2018-07-20 MED ORDER — CALCITRIOL 0.5 MCG PO CAPS
2.0000 ug | ORAL_CAPSULE | ORAL | Status: DC
  Administered 2018-07-21 – 2018-07-23 (×2): 2 ug via ORAL

## 2018-07-20 NOTE — Clinical Social Work Note (Addendum)
CSW following patient's progress and reviewed progress notes. Family interested in [facility] as a possible option. Call made to [facility] with [facility] regarding patient and was advised that they do not provide dialysis for their SNF patients, only the LTAC patient receive dialysis in-house. CSW was also advised that [facility] does not transport patients to outpatient HD centers and they don't have contracts with any of the outpatient dialysis centers. This information was communicated to the patient's doctor.  Delano Frate Givens, MSW, LCSW Licensed Clinical Social Worker [place] [phone]

## 2018-07-20 NOTE — Care Management Note (Signed)
Case Management Note [name], RN MSN CCM Transitions of Care 24M [place] [phone]  Patient Details  Name: Nancy [name] MRN: [id number] Date of Birth: [birth year]  Subjective/Objective:          Acute osteomyelitis          Action/Plan: PTA home with family. Transition recommendation for snf, however, patient has not been able to have dialysis in the chair while in the hospital. Nursing reports that patient cries in pain when the head of her bed is raised and refusing to get up in chair when offered d/t pain. Spoke with granddaughter, [name], to offer choice for LTACH. Tequila chose [facility] and stated that other choice was not an option[facility] is unable to offer a bed at this time. [name] wants her grandmother to be up in chair stating that she has had this pain for 6 months. [name] states that she has been having to get patient up to chair herself and stay with patient throughout treatment, and she will continue to do this. Tequila asked to come to hospital today to put her grandmother in the chair stating she doesn't want her grandmother to be bedridden. Nursing called HD dept for dialysis chair to be brought to room. Dr. Annie Paras notified of situation. Spoke with nephrology PA-C, Shirlee Limerick, about order for granddaughter to stay with patient during dialysis (like patient has when outpatient). Will continue to monitor for transition of care needs.   Expected Discharge Date:  07/14/18               Expected Discharge Plan:  [place]  In-House Referral:  Clinical Social Work  Discharge planning Services  CM Consult, Other - See comment(LTACH)  Post Acute Care Choice:  Long Term Acute Care (LTAC) Choice offered to:  Adult Children  DME Arranged:  N/A DME Agency:  NA  HH Arranged:  NA HH Agency:  NA  Status of Service:  In process, will continue to follow  If discussed at Long Length of Stay Meetings, dates discussed:    Additional  Comments: 07/20/2017-1419-Referral to [facility] for transition of care. Case reviewed. Does not meet criteria per managed medicare d/t not having 3 day ICU stay. Per CSW [facility] snf does not provide hemodialysis to snf patients and do not transport to outpatient hemodialysis.   07/13/18 1620-Received call from MD about LTACH potential since patient cannot return to outpatient hemodialysis. Discussed with MD that pt is not a candidate for LTACH. Spoke with liaison at [facility] who agreed that pt was not an LTACH candidate. Will continue to follow for transition of care needs.   07/10/18 [place], RN MSN CCM Pt's granddaughter, [name], assisted patient out of bed and into shower. Patient taken to hemodialysis in recliner. Tequila at chairside for patient comfort. Noted CSW following for snf placement-pending authorization for [facility].  Will continue to follow for transition of care needs.   Bartholomew Crews, RN 07/20/2018, 2:19 PM

## 2018-07-20 NOTE — Progress Notes (Signed)
   Subjective: Patient seen laying comfortably in bed this morning. She reports feeling well. She wants to get up and take a bath.   Objective:  Vital signs in last 24 hours: Vitals:   07/19/18 1800 07/19/18 1830 07/19/18 1915 07/19/18 1935  BP: (!) 77/40 (!) 84/43 (!) 93/50 (!) 86/42  Pulse: 89 89 90 88  Resp:   16 16  Temp:   98 F (36.7 C) 98 F (36.7 C)  TempSrc:   Oral Oral  SpO2:   100% 100%  Weight:   39.7 kg   Height:       General: elderly frail female, NAD Cardio: diastolic and systolic murmur  Lungs: CTA bilaterally  Extremities: no edema Neuro: bilateral LE strength 5/5  Assessment/Plan:  Principal Problem:   Acute osteomyelitis of thoracic spine (HCC) Active Problems:   Palliative care encounter   ESRD (end stage renal disease) (HCC)   Epidural abscess   Psychomotor agitation   Discitis of thoracolumbar region  Osteomyelitis with ventral epidural abscess -Stable. Afebrile. - Patient unable to tolerate HD and IV placement. Transitioned to PO antibiotic.Will require 6-8 weeks of therapy. Plan -Cipro 500mg  daily (today is day 9 of abx) - Repeat MRI prior to discontinuation of antibiotic therapy per neurosurgery -ContinuePO dilaudid PRN  ESRD on HD (TTS) -Patient tolerated HD with family at bedside yesterday. Will continue to try HD with family present.  Plan  -Nephrology and palliative care following, appreciate recs - HD tomorrow  Dementia and Agitation -Dementia with behavioral disturbances, particularly during dialysis. -Found to not have capacity by psychiatry on 1/2. Appreciate recommendation - Appears calmer with scheduled Zyprexa, which perhaps will help her tolerate dialysis Plan -ScheduledZyprexa 2.5mg qhs  Dispo:Family would like to continue HD and commit to being present at every HD session. Would like to attempt to have the patient placed at [place] [facility] [place].  Nancy [name], [name], MD 07/20/2018, 6:11 AM Pager: [phone]

## 2018-07-20 NOTE — Progress Notes (Signed)
Medicine attending: I examined this patient today together with resident physician Dr. Vilma Prader and I concur with her evaluation and management plan. Much more even tempered since going on a daily dose of Zyprexa. Family here this weekend.  They fixed her hair.  They brought her clothes in. Exam is stable.  Chronic aortic systolic murmur.  Clear lungs.  Motor strength lower extremities remains intact. Ongoing treatment for osteomyelitis of the lower thoracic/upper lumbar spine.  Now on oral Cipro following 10 days of parenteral antibiotics. End-stage renal disease.  More compliant with dialysis with Zyprexa and increased family support. Continue current management.  If she can demonstrate compliance with inpatient dialysis sessions, nephrology will facilitate treatment in an outpatient center.

## 2018-07-20 NOTE — Consult Note (Addendum)
   [facility] Inpatient Consult   07/20/2018  Nancy [name] 1940-10-07 [id number]  Patient chart has been reviewed for readmissions risk score, 67% (extreme) for unplanned readmissions and multiple hospitalizations.  Patient had been active with a community [facility] RN but case closed due to length of hospital admission.  Spoke with inpatient RNCM. Patient plan to continue inpatient dialysis with 1:1 family sitter involvement and possible transition to [facility]. Palliative medicine following patient case as well.   No [facility] Care Management needs at this time.    07/27/2018  [facility] Follow up.    Current disposition plan is for SNF. No [facility] needs at this time.   [name], MSN, [facility] Liaison Nurse Mobile Phone [phone]  Toll free office [phone]

## 2018-07-20 NOTE — Progress Notes (Signed)
Palliative Medicine RN Note: Chart check. Note that pt did well w HD when family was present. I will follow up tomorrow to see if family is present for the HD session and how she tolerates it.  Marjie Skiff [place] Throckmorton, RN, BSN, [facility] Palliative Medicine Team 07/20/2018 1:37 PM Office [phone]

## 2018-07-20 NOTE — Progress Notes (Signed)
[facility] Progress Note   Subjective: Seen in room. Remains confused, but calm at this time. Denies CP/dyspnea.   Objective Vitals:   07/19/18 1915 07/19/18 1935 07/20/18 0636 07/20/18 0900  BP: (!) 93/50 (!) 86/42 (!) 105/56 110/60  Pulse: 90 88 88 91  Resp: 16 16 16 18   Temp: 98 F (36.7 C) 98 F (36.7 C) 98.4 F (36.9 C) 98.5 F (36.9 C)  TempSrc: Oral Oral Oral Oral  SpO2: 100% 100% 100% 100%  Weight: 39.7 kg     Height:       Physical Exam General: Frail woman, NAD. Heart: RRR; 2/6 systolic murmur Lungs: CTA anteriorly Abdomen: soft, non-tender Extremities: No LE edema Dialysis Access: LUE AVF + thrill  Additional Objective Labs: Basic Metabolic Panel: Recent Labs  Lab 07/15/18 1156 07/16/18 1058 07/19/18 1700  NA 136 135 137  K 4.2 3.8 3.6  CL 101 99 101  CO2 23 26 28   GLUCOSE 73 104* 91  BUN 16 23 19   CREATININE 5.09* 6.13* 4.65*  CALCIUM 9.5 9.2 8.7*  PHOS 4.1 4.2 2.7   Liver Function Tests: Recent Labs  Lab 07/15/18 1156 07/16/18 1058 07/19/18 1700  ALBUMIN 2.0* 1.8* 1.9*   CBC: Recent Labs  Lab 07/16/18 1058 07/19/18 1700  WBC 3.6* 1.8*  HGB 9.5* 9.3*  HCT 32.0* 31.9*  MCV 97.3 97.9  PLT 128* 116*   Medications:  . aspirin EC  81 mg Oral Daily  . calcitRIOL  2 mcg Oral Q M,W,F-HD  . Chlorhexidine Gluconate Cloth  6 each Topical Q0600  . ciprofloxacin  500 mg Oral Daily  . [START ON 07/21/2018] darbepoetin (ARANESP) injection - DIALYSIS  60 mcg Intravenous Q Tue-HD  . feeding supplement (NEPRO CARB STEADY)  237 mL Oral BID BM  . feeding supplement (PRO-STAT SUGAR FREE 64)  30 mL Oral BID  . ferric citrate  420 mg Oral TID WC  . heparin  5,000 Units Subcutaneous Q8H  . latanoprost  1 drop Both Eyes QHS  . multivitamin  1 tablet Oral QHS  . OLANZapine  2.5 mg Oral QHS  . polyethylene glycol  17 g Oral Daily  . senna-docusate  1 tablet Oral BID    Dialysis Orders: [facility] MWF 3.5 hrs 180NRe 400/Autoflow  1.5 45.5 kg 3.0 K/ 2.0 Ca  LUA AVF -No heparin -Mircera 225 mcg IV (Last dose 06/02/2018 Last HGB 10.2 06/22/18) -Calcitriol 0.25 mcg PO TIW (Last PTH 136 06/02/18 Last Phos 8.1 06/22/18)  Assessment/Plan: 1.S/p mechanical fall, osteomyelitis/discitis of T12-L1 w/ventral epidural abscess - On 6-8 week course cefepime - this will be a challenge to implement once d/c due to OP noncompliance w/HD. Per neuro needs repeat MRI prior to d/c of antibiotic therapy. Per primary.PT evaluating. 2. ESRD -On HD MWF.Safety isa big issue, patient is disruptive and pulled out her arterial needle during last treatment.Family would like to continue dialysis for now.  They have agreed to sit with patient 1:1 during inpatient HD on TTS schedule, 1st shift preferably.  She remians NOT a candidate for OP dialysis due to behavior and safety issues during dialysis, if there is a large improvement in her behavior for consecutive treatments, it can be readdressed.  3. Anemia of CKD-Hgb stable,last9.3. Continue ESA.Checking labs today. 4. Secondary hyperparathyroidism -CorrCa/Phos ok. On [name].  5. HTN/volume -BP/volume stable. Minimal UF on HD. 6.Malnutrition/FTT - Alb low. Diet liberalized w/fluid restriction. On Nepro/Prostat. Continues to lose weight.  7. Dementia/Agitation/Delirium - Psych eval on  07/09/2018 stated she lacks capacity regarding medical decisions. On Zyprexa for agitation. Daughter is POC. Palliative care consulted. 8. Hx CVA/AS/mod MS - EF ok 9. Dispo - Poor prognosis w/poor QOL. Would be best to transition to comfort care in setting of advanced dementia and behavior issues onHD.  Patient is not a candidate for outpatient dialysis due to behavior/safety concerns.  Family meeting on 07/18/18 w/ palliative & Dr. Jonnie Finner, family would like to continue dialysis for now and commit to being present for all inpatient HD. Agree that [facility] would be a good option for her.  Veneta Penton,  PA-C 07/20/2018, 10:41 AM  [facility] Pager: [phone]

## 2018-07-21 LAB — CBC
HCT: 30.5 % — ABNORMAL LOW (ref 36.0–46.0)
Hemoglobin: 9 g/dL — ABNORMAL LOW (ref 12.0–15.0)
MCH: 29.4 pg (ref 26.0–34.0)
MCHC: 29.5 g/dL — ABNORMAL LOW (ref 30.0–36.0)
MCV: 99.7 fL (ref 80.0–100.0)
PLATELETS: 122 10*3/uL — AB (ref 150–400)
RBC: 3.06 MIL/uL — ABNORMAL LOW (ref 3.87–5.11)
RDW: 18.9 % — ABNORMAL HIGH (ref 11.5–15.5)
WBC: 2.8 10*3/uL — ABNORMAL LOW (ref 4.0–10.5)
nRBC: 0 % (ref 0.0–0.2)

## 2018-07-21 LAB — RENAL FUNCTION PANEL
Albumin: 1.8 g/dL — ABNORMAL LOW (ref 3.5–5.0)
Anion gap: 7 (ref 5–15)
BUN: 23 mg/dL (ref 8–23)
CO2: 28 mmol/L (ref 22–32)
Calcium: 9.6 mg/dL (ref 8.9–10.3)
Chloride: 102 mmol/L (ref 98–111)
Creatinine, Ser: 5.55 mg/dL — ABNORMAL HIGH (ref 0.44–1.00)
GFR calc Af Amer: 8 mL/min — ABNORMAL LOW (ref >60)
GFR calc non Af Amer: 7 mL/min — ABNORMAL LOW (ref >60)
Glucose, Bld: 126 mg/dL — ABNORMAL HIGH (ref 70–99)
Phosphorus: 3.9 mg/dL (ref 2.5–4.6)
Potassium: 4.5 mmol/L (ref 3.5–5.1)
Sodium: 137 mmol/L (ref 135–145)

## 2018-07-21 MED ORDER — SODIUM CHLORIDE 0.9 % IV SOLN: 100.0000 mL | INTRAVENOUS | Status: DC | PRN

## 2018-07-21 MED ORDER — DARBEPOETIN ALFA 60 MCG/0.3ML IJ SOSY
PREFILLED_SYRINGE | INTRAMUSCULAR | Status: AC
  Administered 2018-07-21: 60 ug via INTRAVENOUS
  Filled 2018-07-21: qty 0.3

## 2018-07-21 MED ORDER — MIDODRINE HCL 5 MG PO TABS: 10.0000 mg | ORAL_TABLET | ORAL | Status: DC

## 2018-07-21 MED ORDER — LIDOCAINE HCL (PF) 1 % IJ SOLN: 5.0000 mL | INTRAMUSCULAR | Status: DC | PRN

## 2018-07-21 MED ORDER — CALCITRIOL 0.5 MCG PO CAPS
ORAL_CAPSULE | ORAL | Status: AC
  Administered 2018-07-21: 2 ug via ORAL
  Filled 2018-07-21: qty 4

## 2018-07-21 MED ORDER — LIDOCAINE-PRILOCAINE 2.5-2.5 % EX CREA: 1.0000 "application " | TOPICAL_CREAM | CUTANEOUS | Status: DC | PRN

## 2018-07-21 MED ORDER — HEPARIN SODIUM (PORCINE) 1000 UNIT/ML DIALYSIS: 1000.0000 [IU] | INTRAMUSCULAR | Status: DC | PRN

## 2018-07-21 MED ORDER — MIDODRINE HCL 5 MG PO TABS
10.0000 mg | ORAL_TABLET | ORAL | Status: DC
  Administered 2018-07-23 – 2018-07-25 (×2): 10 mg via ORAL
  Filled 2018-07-21 (×2): qty 2

## 2018-07-21 MED ORDER — ALTEPLASE 2 MG IJ SOLR
2.0000 mg | Freq: Once | INTRAMUSCULAR | Status: DC | PRN
  Filled 2018-07-21: qty 2

## 2018-07-21 MED ORDER — PENTAFLUOROPROP-TETRAFLUOROETH EX AERO: 1.0000 "application " | INHALATION_SPRAY | CUTANEOUS | Status: DC | PRN

## 2018-07-21 NOTE — Progress Notes (Signed)
Called Tequilla,patient's niece if she's coming to sit with patient in dialysis. Per niece,her Mom (pt's daughter) will be coming to sit with patient. [place],HD RN made aware.

## 2018-07-21 NOTE — Progress Notes (Signed)
   Subjective: No overnight events. Patient reports she had a bath yesterday. Patient's daughter at the bedside, states she is doing well. Asked patient if she is feeling too sleepy with medications and she did not respond. Family does not think she has been overly sedated. Patient asked why she is so quiet today but did not respond. Patient nodded when asked if she was okay.   Objective:  Vital signs in last 24 hours: Vitals:   07/20/18 0636 07/20/18 0900 07/20/18 2101 07/21/18 0458  BP: (!) 105/56 110/60 (!) 108/51 (!) 110/54  Pulse: 88 91 (!) 101 98  Resp: 16 18 18 18   Temp: 98.4 F (36.9 C) 98.5 F (36.9 C) 99.4 F (37.4 C) 98.1 F (36.7 C)  TempSrc: Oral Oral Oral Oral  SpO2: 100% 100% 100% 100%  Weight:   40 kg   Height:       General: elderly female, quiet during exam today, able to follow commands and respond appropriately to questions CV: diastolic and systolic murmur Neuro: bilateral LE strength 5/5  Assessment/Plan:  Principal Problem:   Acute osteomyelitis of thoracic spine (HCC) Active Problems:   Palliative care encounter   ESRD (end stage renal disease) (HCC)   Epidural abscess   Psychomotor agitation   Discitis of thoracolumbar region  Osteomyelitis with ventral epidural abscess -Stable. Afebrile. - Patient unable to tolerate HD and IV placement. Transitioned to PO antibiotic.Will require 6-8 weeks of therapy. Plan -Cipro 500mg  daily (today is day 10 of abx) - Repeat MRI prior to discontinuation of antibiotic therapy per neurosurgery -ContinuePO dilaudid PRN - PT eval and treat  ESRD on HD (TTS) -Patient tolerated HD with family at bedside yesterday. Will continue to try HD with family present.If patient is able to tolerate multiple consecutive sessions without behavioral issues, she may be appropriate for outpatient dialysis. Plan  -Nephrology and palliative care following, appreciate recs - HD today  Dementia and Agitation -Dementia  with behavioral disturbances, particularly during dialysis. -Found to not have capacity by psychiatry on 1/2. Appreciate recommendation - Appears calmer with scheduled Zyprexa, which perhaps will help her tolerate dialysis Plan -ScheduledZyprexa 2.5mg qhs  Dispo:Family would like to continue HD and commit to being present at every HD session. Nephrology continues to assess the patient's ability to tolerate HD. If she is appropriate for multiple consecutive sessions, she can be discharged to SNF with transport to outpatient dialysis.  Tony Granquist, [name], MD 07/21/2018, 6:14 AM Pager: [phone]

## 2018-07-21 NOTE — Progress Notes (Signed)
Physical Therapy Treatment Patient Details Name: Nancy [name] MRN: [id number] DOB: [birth year] Today's Date: 07/21/2018    History of Present Illness 78 yo female with a medical history of ESRD on HD MWF, CVA, HTN, diastolic heart failure (EF 60-65% and grade 1 diastolic dysfunction on 01/1061 ECHO), and GERD who presented to the ED with right sided trunk and back pain after she fell tried to sit down into her wheelchair. Pt also frequently misses dialysis sessions. She reports that she was last dialyzed on Wednesday 12/18.  MRI revealed T12-L1 discitis osteomyelitis with ventral epidural abscess mildly flattening the lower cord    PT Comments    Pt agreeable to working with therapist, especially once therapist agreeable to putting lotion on pt's back. Practiced bed mobility, seated and standing balance and pregait activities including pivoting to chair and back to bed. Pt able to pivot with mod A of 1 with no AD. PT will continue to follow.    Follow Up Recommendations  SNF     Equipment Recommendations  None recommended by PT    Recommendations for Other Services       Precautions / Restrictions Precautions Precautions: Fall Precaution Comments: back precautions for comfort Restrictions Weight Bearing Restrictions: No    Mobility  Bed Mobility Overal bed mobility: Needs Assistance Bed Mobility: Supine to Sit;Sit to Supine     Supine to sit: Mod assist Sit to supine: Mod assist   General bed mobility comments: min A to initiate rolling to L and mod A to slide LE's off bed, pt able to elevate trunk. With return to bed, mod A for LE's into bed  Transfers Overall transfer level: Needs assistance Equipment used: None Transfers: Stand Pivot Transfers;Sit to/from Stand Sit to Stand: Mod assist Stand pivot transfers: Mod assist       General transfer comment: mod A for power up and support given as pt took pivot steps to recliner. Pivoted back to bed after short rest in  recliner  Ambulation/Gait             General Gait Details: pt not agreeable to walking   Stairs             Wheelchair Mobility    Modified Rankin (Stroke Patients Only)       Balance Overall balance assessment: Needs assistance Sitting-balance support: Feet supported;No upper extremity supported Sitting balance-Leahy Scale: Fair Sitting balance - Comments: worked on wt shifting to scoot hips to EOB   Standing balance support: Bilateral upper extremity supported;During functional activity Standing balance-Leahy Scale: Poor Standing balance comment: needs UE support B to shift wt                            Cognition Arousal/Alertness: Awake/alert Behavior During Therapy: Restless Overall Cognitive Status: Impaired/Different from baseline Area of Impairment: Attention;Following commands;Orientation;Awareness;Problem solving;Memory                 Orientation Level: Disoriented to;Time;Situation Current Attention Level: Sustained Memory: Decreased short-term memory;Decreased recall of precautions Following Commands: Follows one step commands inconsistently;Follows one step commands with increased time Safety/Judgement: Decreased awareness of deficits;Decreased awareness of safety Awareness: Intellectual Problem Solving: Slow processing;Decreased initiation;Requires verbal cues;Difficulty sequencing;Requires tactile cues General Comments: pt able to tell me that she is at [facility] but is very suspicious, saying that she has been moved rooms and thinks there is someone hiding in her bathroom.       Exercises  General Comments General comments (skin integrity, edema, etc.): pt wanting many blankets after session and still thinking there is someone else in the room      Pertinent Vitals/Pain Pain Assessment: Faces Faces Pain Scale: No hurt    Home Living                      Prior Function            PT Goals  (current goals can now be found in the care plan section) Acute Rehab PT Goals Patient Stated Goal: none stated PT Goal Formulation: Patient unable to participate in goal setting Time For Goal Achievement: 08/01/18 Potential to Achieve Goals: Fair Progress towards PT goals: Progressing toward goals    Frequency    Min 2X/week      PT Plan Current plan remains appropriate    Co-evaluation              AM-PAC PT "6 Clicks" Mobility   Outcome Measure  Help needed turning from your back to your side while in a flat bed without using bedrails?: A Little Help needed moving from lying on your back to sitting on the side of a flat bed without using bedrails?: A Lot Help needed moving to and from a bed to a chair (including a wheelchair)?: A Lot Help needed standing up from a chair using your arms (e.g., wheelchair or bedside chair)?: A Lot Help needed to walk in hospital room?: Total Help needed climbing 3-5 steps with a railing? : Total 6 Click Score: 11    End of Session Equipment Utilized During Treatment: Gait belt Activity Tolerance: Patient tolerated treatment well Patient left: in bed;with call bell/phone within reach;with bed alarm set Nurse Communication: Mobility status PT Visit Diagnosis: Other abnormalities of gait and mobility (R26.89);Pain     Time: 1530-1600 PT Time Calculation (min) (ACUTE ONLY): 30 min  Charges:  $Gait Training: 8-22 mins $Neuromuscular Re-education: 8-22 mins                     Leighton Roach, [place]  Pager [phone] Office [place] 07/21/2018, 4:15 PM

## 2018-07-21 NOTE — Procedures (Signed)
Patient was seen on dialysis and the procedure was supervised.  BFR 400  Via AVF BP dropped during HD, starting midodrine.   Patient appears to be tolerating treatment well  Nancy [name] 07/21/2018

## 2018-07-21 NOTE — Progress Notes (Signed)
Medicine attending: I examined this patient today together with resident physician Dr Vilma Prader and I concur with her evaluation and management plan. Patient very subdued today.  No acute change in exam.  Motor strength lower extremities remains intact.  1 of her daughters is here.  Status and management plan discussed. Day 6 Cipro day 16 total antibiotics for osteomyelitis lower thoracic, upper lumbar osteomyelitis.  IV antibiotics initiated December 28.  Transition to Cipro on January 9.

## 2018-07-21 NOTE — Progress Notes (Signed)
Nutrition Follow-up  DOCUMENTATION CODES:   Severe malnutrition in context of chronic illness, Underweight  INTERVENTION:   Continue Nepro Shake po BID, each supplement provides 425 kcal and 19 grams protein  Continue 30 ml Prostat BID, each supplement provides 100 kcals and 15 grams protein.   Continue Rena-Vit  Continue Magic cup TID with meals, each supplement provides 290 kcal and 9 grams of protein  If aligns with GOC, pt would likely benefit from nutrition support as continuing to lose weight with inadequate po intake. However, not sure that this would improve pt quality of life and may not be best given overall prognosis   NUTRITION DIAGNOSIS:   Severe Malnutrition related to chronic illness(ESRD on HD and dementia) as evidenced by severe muscle depletion, severe fat depletion, percent weight loss.  Continues, being addressed via supplements  GOAL:   Patient will meet greater than or equal to 90% of their needs  Not Met  MONITOR:   Supplement acceptance, PO intake, Labs, Weight trends  REASON FOR ASSESSMENT:   Malnutrition Screening Tool    ASSESSMENT:   78 y.o female with ESRD on HD MWF, CVA, HTN. Pt admitted with pain from a fall, found acute osteomyelitis with ventral epidural abscess and rapidly progressing dementia.   Noted pt doing well with HD when family present; possible d/c to SNF with plan for family to be present at all HD sessions if pt continues to do well with HD  Recorded po intake 0-50% of meals; appears to average around 25%  Current wt 41 kg. Outpatient EDW 45.5 kg. MD notes that pt continues to lose weight.  Last HD on 1/14 with minimal net UF (297 mL)  Labs: reviewed Meds: Auryxia, Rena-Vit, Calictriol   Diet Order:   Diet Order            DIET DYS 3 Room service appropriate? Yes; Fluid consistency: Thin  Diet effective now              EDUCATION NEEDS:   Not appropriate for education at this time  Skin:  Skin  Assessment: Reviewed RN Assessment  Last BM:  1/13  Height:   Ht Readings from Last 1 Encounters:  07/03/18 _0  (1.6 m)    Weight:   Wt Readings from Last 1 Encounters:  07/21/18 41 kg    Ideal Body Weight:  52 kg  BMI:  Body mass index is 16.01 kg/m.  Estimated Nutritional Needs:   Kcal:  1500-1700 kcal/d  Protein:  75-85 g/d  Fluid:  1,000 ml + UOP   [employer] MS, RD, LDN, CNSC [phone] Pager  [phone] Weekend/On-Call Pager

## 2018-07-21 NOTE — Progress Notes (Signed)
Hemodialysis RN made aware that patient's daughter is here to sit with patient in dialysis. Nancy [name], Nancy [name], Therapist, sports

## 2018-07-21 NOTE — Progress Notes (Signed)
[facility] Progress Note   Subjective:   Seen and examined at bedside during HD.  Well behaved at this time with daughter present.  Per nursing staff patient was well behaved last HD on Sunday as long as family member was at bedside.  No complaints.   Objective Vitals:   07/20/18 0636 07/20/18 0900 07/20/18 2101 07/21/18 0458  BP: (!) 105/56 110/60 (!) 108/51 (!) 110/54  Pulse: 88 91 (!) 101 98  Resp: 16 18 18 18   Temp: 98.4 F (36.9 C) 98.5 F (36.9 C) 99.4 F (37.4 C) 98.1 F (36.7 C)  TempSrc: Oral Oral Oral Oral  SpO2: 100% 100% 100% 100%  Weight:   40 kg   Height:       Physical Exam General:NAD, chronically ill appearing, frail, elderly female Heart:RRR, 2/6 systolic murmur Lungs:CTAB anteriorly  Extremities:no LE edema Dialysis Access: LU AVF cannulated    Filed Weights   07/19/18 1604 07/19/18 1915 07/20/18 2101  Weight: 40 kg 39.7 kg 40 kg    Intake/Output Summary (Last 24 hours) at 07/21/2018 0925 Last data filed at 07/21/2018 0900 Gross per 24 hour  Intake 300 ml  Output 0 ml  Net 300 ml    Additional Objective Labs: Basic Metabolic Panel: Recent Labs  Lab 07/15/18 1156 07/16/18 1058 07/19/18 1700  NA 136 135 137  K 4.2 3.8 3.6  CL 101 99 101  CO2 23 26 28   GLUCOSE 73 104* 91  BUN 16 23 19   CREATININE 5.09* 6.13* 4.65*  CALCIUM 9.5 9.2 8.7*  PHOS 4.1 4.2 2.7   Liver Function Tests: Recent Labs  Lab 07/15/18 1156 07/16/18 1058 07/19/18 1700  ALBUMIN 2.0* 1.8* 1.9*   CBC: Recent Labs  Lab 07/16/18 1058 07/19/18 1700  WBC 3.6* 1.8*  HGB 9.5* 9.3*  HCT 32.0* 31.9*  MCV 97.3 97.9  PLT 128* 116*    Medications:  . aspirin EC  81 mg Oral Daily  . calcitRIOL  2 mcg Oral Q T,Th,Sa-HD  . Chlorhexidine Gluconate Cloth  6 each Topical Q0600  . ciprofloxacin  500 mg Oral Daily  . darbepoetin (ARANESP) injection - DIALYSIS  60 mcg Intravenous Q Tue-HD  . feeding supplement (NEPRO CARB STEADY)  237 mL Oral BID BM  .  feeding supplement (PRO-STAT SUGAR FREE 64)  30 mL Oral BID  . ferric citrate  420 mg Oral TID WC  . heparin  5,000 Units Subcutaneous Q8H  . latanoprost  1 drop Both Eyes QHS  . multivitamin  1 tablet Oral QHS  . OLANZapine  2.5 mg Oral QHS  . polyethylene glycol  17 g Oral Daily  . senna-docusate  1 tablet Oral BID    Dialysis Orders: [facility] M[facility] 3.5 hrs 180NRe 400/Autoflow 1.5 45.5 kg 3.0 K/ 2.0 Ca  LUA AVF -No heparin -Mircera 225 mcg IV (Last dose 06/02/2018 Last HGB 10.2 06/22/18) -Calcitriol 0.25 mcg PO TIW (Last PTH 136 06/02/18 Last Phos 8.1 06/22/18)  Assessment/Plan: 1.S/p mechanical fall, osteomyelitis/discitis of T12-L1 w/ventral epidural abscess - On 6-8 week course cefepime - this will be a challenge to implement once d/c due to OP noncompliance w/HD. Per neuro needs repeat MRI prior to d/c of antibiotic therapy. Per primary.PT evaluating. 2. ESRD -On HD MWF.Last K 3.6, checking labs today. Safety isa big issue, patient is disruptive and pulled out her arterial needle during last treatment.Family would like to continue dialysis for now. They have agreed to sit with patient 1:1 during inpatient HD  on TTS schedule, 1st shift preferably. She remiansNOT a candidate for OP dialysis due to behavior and safety issues during dialysis, if there is a large improvement in her behavior for consecutive treatments, it can be readdressed.  Continue to monitor patient behavior while on inpatient HD. 3. Anemia of CKD-Hgb stable,last9.3. Continue ESA.Checking labs today. 4. Secondary hyperparathyroidism -CorrCa/Phos ok. On [name]. Checking labs today. 5. HTN/volume -BP/volume stable. Minimal UF on HD. 6.Malnutrition/FTT - Alb low. Diet liberalized w/fluid restriction. On Nepro/Prostat. Continues to lose weight.  7. Dementia/Agitation/Delirium - Psych eval on 07/09/2018 stated she lacks capacity regarding medical decisions. On Zyprexa for agitation. Daughter is POC.  Palliative care consulted. 8. Hx CVA/AS/mod MS - EF ok 9. Dispo - Poor prognosis w/poor QOL. Would be best to transition to comfort care in setting of advanced dementia and behavior issues onHD. Patient is not a candidate for outpatient dialysis due to behavior/safety concerns. Family meeting on 07/18/18 w/ palliative &Dr. Jonnie Finner, family would like to continue dialysis for now and commit to being present for all inpatient HD. Agree that [facility] would be a good option for her.  Jen Mow, PA-C [facility] Pager: [phone] 07/21/2018,9:25 AM  LOS: 18 days

## 2018-07-21 NOTE — Progress Notes (Signed)
Pt refuses to have lab draw blood. MD notified.

## 2018-07-21 NOTE — Progress Notes (Signed)
Palliative Medicine RN Note: Chart review. Note that she did well again at HD when family present. Will continue to monitor; hopeful she can continue doing well on HD and can go to SNF.  Marjie Skiff Mirenda Baltazar, RN, BSN, [facility] Palliative Medicine Team 07/21/2018 2:18 PM Office [phone]

## 2018-07-22 DIAGNOSIS — D61818 Other pancytopenia: Secondary | ICD-10-CM

## 2018-07-22 MED ORDER — CHLORHEXIDINE GLUCONATE CLOTH 2 % EX PADS: 6.0000 | MEDICATED_PAD | Freq: Every day | CUTANEOUS | Status: DC

## 2018-07-22 NOTE — Progress Notes (Addendum)
[facility] Progress Note   Subjective:   Patient seen and examined at bedside. No family present.  No new complaints.  Behavior better at HD yesterday with family present. Says would feel better with a hot bath- remembers HD yesterday- no issues from her standpoint  Objective Vitals:   07/21/18 1230 07/21/18 1242 07/21/18 2153 07/22/18 0549  BP: (!) 92/52 (!) 85/43 (!) 118/56 (!) 101/51  Pulse: 64 94 (!) 103 100  Resp:  16 18 18   Temp:  97.9 F (36.6 C) 99 F (37.2 C) 97.6 F (36.4 C)  TempSrc:  Oral Oral   SpO2:  96% 100% 100%  Weight:   41 kg   Height:       Physical Exam General:NAD, thin, frail female Heart:RRR Lungs:CTAB Abdomen:soft, NTND Extremities:no LE edema Dialysis Access: LU AVF   Filed Weights   07/19/18 1915 07/20/18 2101 07/21/18 2153  Weight: 39.7 kg 40 kg 41 kg    Intake/Output Summary (Last 24 hours) at 07/22/2018 0858 Last data filed at 07/22/2018 0600 Gross per 24 hour  Intake 240 ml  Output 297 ml  Net -57 ml    Additional Objective Labs: Basic Metabolic Panel: Recent Labs  Lab 07/16/18 1058 07/19/18 1700 07/21/18 0932  NA 135 137 137  K 3.8 3.6 4.5  CL 99 101 102  CO2 26 28 28   GLUCOSE 104* 91 126*  BUN 23 19 23   CREATININE 6.13* 4.65* 5.55*  CALCIUM 9.2 8.7* 9.6  PHOS 4.2 2.7 3.9   Liver Function Tests: Recent Labs  Lab 07/16/18 1058 07/19/18 1700 07/21/18 0932  ALBUMIN 1.8* 1.9* 1.8*   CBC: Recent Labs  Lab 07/16/18 1058 07/19/18 1700 07/21/18 0933  WBC 3.6* 1.8* 2.8*  HGB 9.5* 9.3* 9.0*  HCT 32.0* 31.9* 30.5*  MCV 97.3 97.9 99.7  PLT 128* 116* 122*   CBG: Recent Labs  Lab 07/18/18 2052  GLUCAP 103*    Medications: . sodium chloride    . sodium chloride     . aspirin EC  81 mg Oral Daily  . calcitRIOL  2 mcg Oral Q T,Th,Sa-HD  . Chlorhexidine Gluconate Cloth  6 each Topical Q0600  . ciprofloxacin  500 mg Oral Daily  . darbepoetin (ARANESP) injection - DIALYSIS  60 mcg Intravenous Q  Tue-HD  . feeding supplement (NEPRO CARB STEADY)  237 mL Oral BID BM  . feeding supplement (PRO-STAT SUGAR FREE 64)  30 mL Oral BID  . ferric citrate  420 mg Oral TID WC  . heparin  5,000 Units Subcutaneous Q8H  . latanoprost  1 drop Both Eyes QHS  . [START ON 07/23/2018] midodrine  10 mg Oral Once per day on Tue Thu Sat  . multivitamin  1 tablet Oral QHS  . OLANZapine  2.5 mg Oral QHS  . polyethylene glycol  17 g Oral Daily  . senna-docusate  1 tablet Oral BID    Dialysis Orders: [facility] M[facility] 3.5 hrs 180NRe 400/Autoflow 1.5 45.5 kg 3.0 K/ 2.0 Ca  LUA AVF -No heparin -Mircera 225 mcg IV (Last dose 06/02/2018 Last HGB 10.2 06/22/18) -Calcitriol 0.25 mcg PO TIW (Last PTH 136 06/02/18 Last Phos 8.1 06/22/18)  Assessment/Plan: 1.S/p mechanical fall, osteomyelitis/discitis of T12-L1 w/ventral epidural abscess - On 6-8 week course cefepime - this will be a challenge to implement once d/c due to OP noncompliance w/HD. Per neuro needs repeat MRI prior to d/c of antibiotic therapy. Per primary.PT evaluating. 2. ESRD -On HD MWF. K 4.5. Safety  isa big issue, patient is disruptive and pulled out her arterial needle during last week.Family would like to continue dialysis for now. They have agreed to sit with patient 1:1 during inpatient HD on TTS schedule, 1st shift preferably. Now will attempt with family sitting with her to see if can be managed in the OP unit.  Improvement in behavior for last 2HD as long as family was present.  Continue to monitor patient behavior while on inpatient HD.   3. Anemia of CKD-Hgb stable,last9.0. Continue ESA. 4. Secondary hyperparathyroidism -CorrCa/Phos in goal.On [name].  5. HTN/volume -BP/volume stable. Minimal UF on HD. 6.Malnutrition/FTT - Alb low. Diet liberalized w/fluid restriction. On Nepro/Prostat. Continues to lose weight.  7. Dementia/Agitation/Delirium - Psych eval on 07/09/2018 stated she lacks capacity regarding medical decisions. On  Zyprexa for agitation. Daughter is POC. Palliative care consulted. 8. Hx CVA/AS/mod MS - EF ok 9. Dispo - Poor prognosis w/poor QOL. Would be best to transition to comfort care in setting of advanced dementia and behavior issues onHD. Patient previously not a candidate for outpatient dialysis due to behavior/safety concerns- attempting trial with family sitting with her. Family meeting on 07/18/18 w/ palliative &Dr. Jonnie Finner, family would like to continue dialysis for now and commit to being present for all inpatient HD.Agree that [facility] would be a good option for her.  Will reach out to Dr. Joelyn Oms if patient continues to show improvement in behavior with HD the rest of the week.   Jen Mow, PA-C [facility] Pager: [phone] 07/22/2018,8:58 AM  LOS: 19 days    Patient seen and examined, agree with above note with above modifications. No new c/o's today- I guess HD went well with family present yesterday.  Will be plan for family to sit with pt during HD for safety and will see if that will be acceptable for OP setting - labs and vitals stable  Corliss Parish, MD 07/22/2018

## 2018-07-22 NOTE — Progress Notes (Addendum)
Medicine attending: I examined this patient today together with resident physician Dr Vilma Prader and I concur with her evaluation and management plan. No acute change in condition.  Exam stable.  Motor strength lower extremities intact.  She continues on antibiotics.  Our pharmacy residents reviewed her active med list.  She received a total of 3 doses of cefipime initially.  Due to hemodialysis compliance or I should say noncompliance issues it appears that some doses were skipped.  She was started on Cipro p.o. on January 9 after she pulled out all of her IV lines.  Today day 7. Pancytopenia stable to improved.  She was initially resistant to working with physical therapy but they were able to get her to comply.  Skilled nursing facility remains recommendation for post hospital care.  Impression: 1.  End-stage renal disease on dialysis. Recent challenges due to disruptive behavior.  Things have improved significantly on Zyprexa and increased family support. 2.  Pseudomonas T12-L1 osteomyelitis/discitis/epidural abscess. Intermittent IV antibiotics between December 30 and January 6.  Now on prolonged course of oral Cipro. 3.  Mild dementia with psychomotor agitation. Temperament improved on Zyprexa. Disposition: Waiting for skilled nursing bed and nephrology reevaluation as to whether or not she can be a suitable candidate for an outpatient dialysis facility. 4.  Pancytopenia Medication versus infection related.  Present on admission.  Counts fluctuating with current trend for improvement.  Continue to monitor periodically.

## 2018-07-22 NOTE — Progress Notes (Signed)
   Subjective: No overnight events. This morning, Ms. Broz is seen working with OT and getting a bath. She has difficulty sitting up off of the bed because of the pain in her back. She also finds the bath water too cold. She has no other concerns at this time.   Objective:  Vital signs in last 24 hours: Vitals:   07/21/18 1230 07/21/18 1242 07/21/18 2153 07/22/18 0549  BP: (!) 92/52 (!) 85/43 (!) 118/56 (!) 101/51  Pulse: 64 94 (!) 103 100  Resp:  16 18 18   Temp:  97.9 F (36.6 C) 99 F (37.2 C) 97.6 F (36.4 C)  TempSrc:  Oral Oral   SpO2:  96% 100% 100%  Weight:   41 kg   Height:       Gen: Thin woman, no distress Pulm: CTAB, no murmurs Back: No deformities. TTP over the low thoracic/high lumbar spine Ext: No edema  Assessment/Plan:  Principal Problem:   Acute osteomyelitis of thoracic spine [facility]) Active Problems:   Palliative care encounter   ESRD (end stage renal disease) ([place])   Epidural abscess   Psychomotor agitation   Discitis of thoracolumbar region  Ms. Alles is a89 yoF with ESRD on HD MWF, CVA,and HTN presenting with right sided trunk/back pain after a fall, found to have osteomylitis/discitis of T12-L1 with ventral epidural abscess. She was subsequently admitted for further evaluation and management.  Osteomyelitis with ventral epidural abscess -Stable. Afebrile. - Patient unable to tolerate HD and IV placement. Transitioned to PO antibiotic.Will require 6-8 weeks of therapy. Will confirm dosing schedule with pharmacy team. Plan -Cipro 500mg  daily - Repeat MRI after one month of antibiotic therapy  -ContinuePO dilaudid PRN - PT/OT eval and treat  ESRD on HD (TTS) - Last dialyzed 1/14 -Patient has tolerated her last two sessions of HD with family present. If patient continues to display this behavior over multiple consecutive sessions, she may be appropriate for outpatient dialysis. Plan  -Nephrology and palliative care following, appreciate  recs -HD tomorrow  Dementia and Agitation -Dementia with behavioral disturbances, particularly during dialysis. Improving. -Found to not have capacity by psychiatry on 1/2. Appreciate recommendation - Appears calmer with scheduled Zyprexa. Plan -ScheduledZyprexa 2.5mg qhs  Dispo:Family would like to continue HD and commit to being present at every HD session. Nephrology continues to assess the patient's ability to tolerate HD. If she is appropriate for multiple consecutive sessions, she can be discharged to SNF with transport to outpatient dialysis.  Demontrae Gilbert, [name], MD 07/22/2018, 6:54 AM Pager: [phone]

## 2018-07-22 NOTE — Progress Notes (Signed)
Occupational Therapy Treatment Patient Details Name: [place] B Knoedler MRN: [id number] DOB: 08/20/1940 Today's Date: 07/22/2018    History of present illness 78 yo female with a medical history of ESRD on HD MWF, CVA, HTN, diastolic heart failure (EF 60-65% and grade 1 diastolic dysfunction on 10/971 ECHO), and GERD who presented to the ED with right sided trunk and back pain after she fell tried to sit down into her wheelchair. Pt also frequently misses dialysis sessions. She reports that she was last dialyzed on Wednesday 12/18.  MRI revealed T12-L1 discitis osteomyelitis with ventral epidural abscess mildly flattening the lower cord   OT comments  Session limited by pt's behavior and c/o back pain during bed mobility.Pt would not sit upright with 2 attempts. Pt leaning to R side sitting EOB long enough to assist with UB bathing. Pt refused to stand for transfers. Pt did participate in some of her own selfcare washing her face , donning clean top and peri hygiene while in supine. OT will continue to follow acutely  Follow Up Recommendations  SNF;Supervision/Assistance - 24 hour    Equipment Recommendations  None recommended by OT    Recommendations for Other Services      Precautions / Restrictions Precautions Precautions: Fall Precaution Comments: back precautions for comfort Restrictions Weight Bearing Restrictions: No       Mobility Bed Mobility Overal bed mobility: Needs Assistance Bed Mobility: Supine to Sit;Sit to Supine     Supine to sit: Mod assist Sit to supine: Mod assist   General bed mobility comments: pt would not sit upright with 2 attempts. Pt leaning to R side sitting EOB long enough to assist with UB bathing  Transfers                 General transfer comment: pt refused    Balance Overall balance assessment: Needs assistance Sitting-balance support: Feet supported;No upper extremity supported Sitting balance-Leahy Scale: Poor Sitting balance -  Comments: Pt leaning to R side, refusing to sit upright due to back pain                                   ADL either performed or assessed with clinical judgement   ADL Overall ADL's : Needs assistance/impaired     Grooming: Minimal assistance;Wash/dry face   Upper Body Bathing: Maximal assistance;Sitting   Lower Body Bathing: Maximal assistance Lower Body Bathing Details (indicate cue type and reason): pt able to wash thighs and periarea in supine,  Upper Body Dressing : Moderate assistance;Sitting                     General ADL Comments: pt being resistive to assist with selfcare intially and would not sit upright on EOB due to back pain     Vision Patient Visual Report: No change from baseline     Perception     Praxis      Cognition Arousal/Alertness: Awake/alert Behavior During Therapy: Agitated Overall Cognitive Status: Impaired/Different from baseline Area of Impairment: Attention;Following commands;Orientation;Awareness;Problem solving;Memory                 Orientation Level: Disoriented to;Time;Situation   Memory: Decreased short-term memory;Decreased recall of precautions Following Commands: Follows one step commands inconsistently;Follows one step commands with increased time Safety/Judgement: Decreased awareness of deficits;Decreased awareness of safety   Problem Solving: Slow processing;Decreased initiation;Requires verbal cues;Difficulty sequencing;Requires tactile cues  Exercises     Shoulder Instructions       General Comments      Pertinent Vitals/ Pain       Pain Assessment: Faces Faces Pain Scale: Hurts whole lot Pain Location: Back Pain Descriptors / Indicators: Grimacing;Moaning Pain Intervention(s): Limited activity within patient's tolerance;Monitored during session;Repositioned  Home Living                                          Prior Functioning/Environment               Frequency  Min 2X/week        Progress Toward Goals  OT Goals(current goals can now be found in the care plan section)  Progress towards OT goals: OT to reassess next treatment     Plan Discharge plan remains appropriate    Co-evaluation                 AM-PAC OT "6 Clicks" Daily Activity     Outcome Measure   Help from another person eating meals?: None Help from another person taking care of personal grooming?: A Little Help from another person toileting, which includes using toliet, bedpan, or urinal?: Total Help from another person bathing (including washing, rinsing, drying)?: A Lot Help from another person to put on and taking off regular upper body clothing?: A Lot Help from another person to put on and taking off regular lower body clothing?: Total 6 Click Score: 13    End of Session    OT Visit Diagnosis: Muscle weakness (generalized) (M62.81)   Activity Tolerance Treatment limited secondary to agitation;No increased pain   Patient Left in bed;with call bell/phone within reach;with nursing/sitter in room   Nurse Communication          Time: [phone] OT Time Calculation (min): 33 min  Charges: OT General Charges $OT Visit: 1 Visit OT Treatments $Self Care/Home Management : 8-22 mins $Therapeutic Activity: 8-22 mins     [name] 07/22/2018, 12:47 PM

## 2018-07-22 NOTE — Progress Notes (Signed)
Patient's daughter willing to come tomorrow at 7 am for HD and the HD nurse is aware of it.

## 2018-07-22 NOTE — Progress Notes (Signed)
Pharmacy students rounding with [facility]. Addressing question with regard to number of days of antibiotic therapy received by patient. According to the [facility], the patient received 3 total doses of cefepime 2 g IV (to be given with hemodialysis) on 07/06/2018, 07/10/2018, and 07/13/2018. Ciprofloxacin 500 mg PO was initiated on 07/16/2018 with today being day 7 of therapy for this antibiotic. Vancomycin 500 mg IV and levofloxacin 500 mg PO, which were also listed in the [facility], were never received by the patient.  [place], [employer] Pharmacy Students

## 2018-07-23 DIAGNOSIS — I959 Hypotension, unspecified: Secondary | ICD-10-CM

## 2018-07-23 LAB — RENAL FUNCTION PANEL
ANION GAP: 9 (ref 5–15)
Albumin: 2.2 g/dL — ABNORMAL LOW (ref 3.5–5.0)
BUN: 16 mg/dL (ref 8–23)
CHLORIDE: 100 mmol/L (ref 98–111)
CO2: 29 mmol/L (ref 22–32)
Calcium: 11 mg/dL — ABNORMAL HIGH (ref 8.9–10.3)
Creatinine, Ser: 4.51 mg/dL — ABNORMAL HIGH (ref 0.44–1.00)
GFR calc Af Amer: 10 mL/min — ABNORMAL LOW (ref >60)
GFR calc non Af Amer: 9 mL/min — ABNORMAL LOW (ref >60)
Glucose, Bld: 114 mg/dL — ABNORMAL HIGH (ref 70–99)
Phosphorus: 3.4 mg/dL (ref 2.5–4.6)
Potassium: 4.8 mmol/L (ref 3.5–5.1)
Sodium: 138 mmol/L (ref 135–145)

## 2018-07-23 LAB — CBC
HEMATOCRIT: 35.5 % — AB (ref 36.0–46.0)
Hemoglobin: 10.6 g/dL — ABNORMAL LOW (ref 12.0–15.0)
MCH: 29.6 pg (ref 26.0–34.0)
MCHC: 29.9 g/dL — ABNORMAL LOW (ref 30.0–36.0)
MCV: 99.2 fL (ref 80.0–100.0)
Platelets: 153 10*3/uL (ref 150–400)
RBC: 3.58 MIL/uL — ABNORMAL LOW (ref 3.87–5.11)
RDW: 18.5 % — ABNORMAL HIGH (ref 11.5–15.5)
WBC: 4.6 10*3/uL (ref 4.0–10.5)
nRBC: 0 % (ref 0.0–0.2)

## 2018-07-23 MED ORDER — LIDOCAINE HCL (PF) 1 % IJ SOLN: 5.0000 mL | INTRAMUSCULAR | Status: DC | PRN

## 2018-07-23 MED ORDER — HEPARIN SODIUM (PORCINE) 1000 UNIT/ML DIALYSIS: 1000.0000 [IU] | INTRAMUSCULAR | Status: DC | PRN

## 2018-07-23 MED ORDER — PROMETHAZINE HCL 25 MG PO TABS
ORAL_TABLET | ORAL | Status: AC
  Filled 2018-07-23: qty 1

## 2018-07-23 MED ORDER — ALTEPLASE 2 MG IJ SOLR: 2.0000 mg | Freq: Once | INTRAMUSCULAR | Status: DC | PRN

## 2018-07-23 MED ORDER — MIDODRINE HCL 5 MG PO TABS
ORAL_TABLET | ORAL | Status: AC
  Filled 2018-07-23: qty 2

## 2018-07-23 MED ORDER — PENTAFLUOROPROP-TETRAFLUOROETH EX AERO: 1.0000 "application " | INHALATION_SPRAY | CUTANEOUS | Status: DC | PRN

## 2018-07-23 MED ORDER — LIDOCAINE-PRILOCAINE 2.5-2.5 % EX CREA: 1.0000 "application " | TOPICAL_CREAM | CUTANEOUS | Status: DC | PRN

## 2018-07-23 MED ORDER — SODIUM CHLORIDE 0.9 % IV SOLN: 100.0000 mL | INTRAVENOUS | Status: DC | PRN

## 2018-07-23 MED ORDER — CALCITRIOL 0.5 MCG PO CAPS
ORAL_CAPSULE | ORAL | Status: AC
  Filled 2018-07-23: qty 4

## 2018-07-23 NOTE — Progress Notes (Signed)
   Subjective: Patient seen in HD. Resting comfortably. Granddaughter at bedside, states that she was a little upset earlier but has calmed down. She isn't feeling well this AM. She tells [place] she feels terrible, having some chest and abdominal discomfort. She feels very cold and thinks it is related to HD. We discussed continuing HD and antibiotics. All questions and concerns addressed.   Objective: Vital signs in last 24 hours: Vitals:   07/23/18 0900 07/23/18 0930 07/23/18 1000 07/23/18 1030  BP: (!) 97/52 (!) 92/48 (!) 120/55 (!) 111/53  Pulse: (!) 104 99 98 98  Resp: 16 17 16 15   Temp:      TempSrc:      SpO2:      Weight:      Height:       General: Thin elderly female in no acute distress Psych: Calm, linear thought process  Assessment/Plan:  Principal Problem:   Acute osteomyelitis of thoracic spine (HCC) Active Problems:   Palliative care encounter   ESRD (end stage renal disease) ([place])   Epidural abscess   Psychomotor agitation   Discitis of thoracolumbar region   Pancytopenia ([place])  Ms. Bost is a45 yoF with ESRD on HD MWF, CVA,and HTN presenting with right sided trunk/back pain after a fall, found to have osteomylitis/discitis of T12-L1 with ventral epidural abscess. She was subsequently admitted for further evaluation and management.  Osteomyelitis with ventral epidural abscess -Stable. Afebrile. - Initially on Cefepime with HD, but this dosing was sporadic and intermittent due to her inability to tolerate HD. She received 2g Cefepime on 12/28, 12/29, 12/30, 1/3, and 1/6. She was transitioned to PO cipro on 1/9 for more regular dosing. Will require 6-8 weeks of therapy.  Plan -Cipro 500mg  daily (today is day 13 of abx therapy) - Repeat MRI after one month of antibiotic therapy (around 08/07/2018) to guide further antimicrobial therapy -ContinuePO dilaudid PRN - PT/OT eval and treat  ESRD on HD (TTS) - Seen during HD today  -Patient has tolerated her last  two sessions of HD with family present. If patient continues to display this behavior over multiple consecutive sessions, she may be appropriate for outpatient dialysis. Plan  -Nephrology and palliative care following, appreciate recs -HDtoday  Hypotension - Patient has had soft blood pressures especially after dialysis sessions Plan - Start midodrine on dialysis days per nephrology   Dementia and Agitation -Dementia with behavioral disturbances, particularly during dialysis. Improving. -Found to not have capacity by psychiatry on 1/2. Appreciate recommendation - Appears calmer with scheduled Zyprexa. Plan -ScheduledZyprexa 2.5mg qhs  Dispo:Family would like to continue HD and commit to being present at every HD session.Nephrology continues to assess the patient's ability to tolerate HD. If she is appropriate for multiple consecutive sessions, she can be discharged to SNF with transport to outpatient dialysis. She is not a candidate for [facility] as they do not transport patients to outpatient HD.  Ina Homes, MD 07/23/2018, 11:02 AM Pager: [phone]

## 2018-07-23 NOTE — Progress Notes (Signed)
  Date: 07/23/2018  Patient name: [place] B Parrales  Medical record number: [id number]  Date of birth: 1941/03/12   I have seen and evaluated this patient and I have discussed the plan of care with the house staff. Please see their note for complete details. I concur with their findings with the following additions/corrections:   Seen and examined with the team at dialysis.  She seemed comfortable and calm at the time of our evaluation, but in our conversation did ask to come off the machine despite having approximately 2 hours remaining.  Her granddaughter was at her side and helped her remain calm.  Hopefully, with her improved compliance with dialysis, we may be able to discharge her soon with outpatient dialysis.  [name], M.D., Ph.D. 07/23/2018, 3:57 PM

## 2018-07-23 NOTE — Progress Notes (Signed)
[facility] Progress Note   Subjective:   Patient seen on HD- was asking to come off but family is here to comfort her-  BFR needing to be low for this tx but clearance still good   Objective Vitals:   07/23/18 0900 07/23/18 0930 07/23/18 1000 07/23/18 1030  BP: (!) 97/52 (!) 92/48 (!) 120/55 (!) 111/53  Pulse: (!) 104 99 98 98  Resp: 16 17 16 15   Temp:      TempSrc:      SpO2:      Weight:      Height:       Physical Exam General:NAD, thin, frail female Heart:RRR Lungs:CTAB Abdomen:soft, NTND Extremities:no LE edema Dialysis Access: LU AVF   Filed Weights   07/21/18 2153 07/23/18 0559 07/23/18 0800  Weight: 41 kg 40 kg 39 kg    Intake/Output Summary (Last 24 hours) at 07/23/2018 1052 Last data filed at 07/23/2018 0601 Gross per 24 hour  Intake 120 ml  Output -  Net 120 ml    Additional Objective Labs: Basic Metabolic Panel: Recent Labs  Lab 07/19/18 1700 07/21/18 0932 07/23/18 0825  NA 137 137 138  K 3.6 4.5 4.8  CL 101 102 100  CO2 28 28 29   GLUCOSE 91 126* 114*  BUN 19 23 16   CREATININE 4.65* 5.55* 4.51*  CALCIUM 8.7* 9.6 11.0*  PHOS 2.7 3.9 3.4   Liver Function Tests: Recent Labs  Lab 07/19/18 1700 07/21/18 0932 07/23/18 0825  ALBUMIN 1.9* 1.8* 2.2*   CBC: Recent Labs  Lab 07/16/18 1058 07/19/18 1700 07/21/18 0933 07/23/18 0825  WBC 3.6* 1.8* 2.8* 4.6  HGB 9.5* 9.3* 9.0* 10.6*  HCT 32.0* 31.9* 30.5* 35.5*  MCV 97.3 97.9 99.7 99.2  PLT 128* 116* 122* 153   CBG: Recent Labs  Lab 07/18/18 2052  GLUCAP 103*    Medications: . sodium chloride    . sodium chloride    . sodium chloride    . sodium chloride     . aspirin EC  81 mg Oral Daily  . calcitRIOL      . calcitRIOL  2 mcg Oral Q T,Th,Sa-HD  . Chlorhexidine Gluconate Cloth  6 each Topical Q0600  . ciprofloxacin  500 mg Oral Daily  . darbepoetin (ARANESP) injection - DIALYSIS  60 mcg Intravenous Q Tue-HD  . feeding supplement (NEPRO CARB STEADY)  237 mL  Oral BID BM  . feeding supplement (PRO-STAT SUGAR FREE 64)  30 mL Oral BID  . ferric citrate  420 mg Oral TID WC  . heparin  5,000 Units Subcutaneous Q8H  . latanoprost  1 drop Both Eyes QHS  . midodrine  10 mg Oral Once per day on Tue Thu Sat  . multivitamin  1 tablet Oral QHS  . OLANZapine  2.5 mg Oral QHS  . polyethylene glycol  17 g Oral Daily  . senna-docusate  1 tablet Oral BID    Dialysis Orders: [facility] MWF 3.5 hrs 180NRe 400/Autoflow 1.5 45.5 kg 3.0 K/ 2.0 Ca  LUA AVF -No heparin -Mircera 225 mcg IV (Last dose 06/02/2018 Last HGB 10.2 06/22/18) -Calcitriol 0.25 mcg PO TIW (Last PTH 136 06/02/18 Last Phos 8.1 06/22/18)  Assessment/Plan: 1.S/p mechanical fall, osteomyelitis/discitis of T12-L1 w/ventral epidural abscess - On 6-8 week course cefepime -now on oral ciproPer neuro needs repeat MRI prior to d/c of antibiotic therapy. Per primary.PT evaluating. 2. ESRD -On HD MWF. K 4.5. Safety wasa big issue, patient is disruptive and pulled  out her arterial needle during last week.Family would like to continue dialysis for now. They have agreed to sit with patient 1:1 during inpatient HD on TTS schedule, 1st shift preferably- now on zyprexa as well.  Improvement in behavior for last 3 HD as long as family was present.  I think OK to declare safe to return to unit.   3. Anemia of CKD-Hgb stable,last9.0. Continue ESA. Improved last check 4. Secondary hyperparathyroidism -CorrCa/Phos in goal.On [name].  5. HTN/volume -BP/volume stable. Minimal UF on HD. 6.Malnutrition/FTT - Alb low. Diet liberalized w/fluid restriction. On Nepro/Prostat. Continues to lose weight.  7. Dementia/Agitation/Delirium - Psych eval on 07/09/2018 stated she lacks capacity regarding medical decisions. On Zyprexa for agitation. Daughter is POC. Palliative care consulted but family would like to continue HD. 8. Hx CVA/AS/mod MS - EF ok 9. Dispo - Poor prognosis w/poor QOL. Talked about  transition to comfort care in setting of advanced dementia and behavior issues onHD.  Family meeting on 07/18/18 w/ palliative &Dr. Jonnie Finner, family would like to continue dialysis for now and commit to being present for all inpatient HD in addition to zyprexa. Dr. Joelyn Oms agreeable to take pt back.  Grand daughter is asking about [facility] for some rehab- not sure - per primary   Corliss [place], MD 07/23/2018

## 2018-07-23 NOTE — Procedures (Signed)
Patient was seen on dialysis and the procedure was supervised.  BFR 250  Via AVF BP is  103/70.   Patient appears to be tolerating treatment well  Louis Meckel 07/23/2018

## 2018-07-24 MED ORDER — CHLORHEXIDINE GLUCONATE CLOTH 2 % EX PADS
6.0000 | MEDICATED_PAD | Freq: Every day | CUTANEOUS | Status: DC
  Administered 2018-07-24: 6 via TOPICAL

## 2018-07-24 NOTE — Progress Notes (Addendum)
   Subjective: Seen comfortably laying in bed. Patient was asking for her chocolate pudding this morning. She was informed her HD went well and we are working to find her a rehab facility. Denies back pain today.  Objective:  Vital signs in last 24 hours: Vitals:   07/23/18 1142 07/23/18 1240 07/23/18 1833 07/23/18 2015  BP: (!) 164/68 (!) 155/60 126/65 (!) 135/56  Pulse: 99 100 (!) 108 (!) 107  Resp: 16 18 18 20   Temp: 98 F (36.7 C) 97.7 F (36.5 C) (!) 97.4 F (36.3 C) (!) 97.5 F (36.4 C)  TempSrc: Oral Oral Oral Oral  SpO2: 98% 100% 100% 100%  Weight: 38 kg   38 kg  Height:       Gen: seen laying comfortably in bed, nad Neuro: bilateral LE strength 5/5 Extremities: no edema  Assessment/Plan:  Principal Problem:   Acute osteomyelitis of thoracic spine (HCC) Active Problems:   Palliative care encounter   ESRD (end stage renal disease) (HCC)   Epidural abscess   Psychomotor agitation   Discitis of thoracolumbar region   Pancytopenia ([place])  Nancy [name] is a26 yoF with ESRD on HD MWF, CVA,and HTN presenting with right sided trunk/back pain after a fall, found to have osteomylitis/discitis of T12-L1 with ventral epidural abscess. She was subsequently admitted for further evaluation and management.  Osteomyelitis with ventral epidural abscess -Stable. Afebrile. - Initially on Cefepime with HD, but this dosing was sporadic and intermittent due to her inability to tolerate HD. She received 2g Cefepime on 12/28, 12/29, 12/30, 1/3, and 1/6. She was transitioned to PO cipro on 1/9 for more regular dosing. Will require 6-8 weeks of therapy. Plan -Cipro 500mg  daily (today is day 14 of abx therapy) - Repeat MRIafter one month of antibiotic therapy(around 08/07/2018) to guide further antimicrobial therapy -ContinuePO dilaudid PRN - PT/OTeval and treat  ESRD on HD (TTS) - Last dialysis 1/16 -Patient has tolerated her last three sessions of HD with family present.  Nephrology now considers her appropriate for outpatient dialysis.  Plan  -Nephrology and palliative care following, appreciate recs - Midodrine on dialysis days for hypotension around sessions - Social work consulted for help with placement - Dialysis tomorrow  Dementia and Agitation -Dementia with behavioral disturbances, particularly during dialysis have improved.  -Found to not have capacity by psychiatry on 1/2. Appreciate recommendation - Appears calmer with scheduled Zyprexa. Plan -ScheduledZyprexa 2.5mg qhs  Dispo:Family would like to continue HD and commit to being present at every HD session.Nephrology now considers her appropriate for outpatient dialysis. Discharge pending SNF placement.  Nancy [name], [name], MD 07/24/2018, 6:32 AM Pager: [phone]

## 2018-07-24 NOTE — Progress Notes (Signed)
Occupational Therapy Treatment Patient Details Name: Nancy [name] MRN: [id number] DOB: 10-Jul-1940 Today's Date: 07/24/2018    History of present illness 78 yo female with a medical history of ESRD on HD MWF, CVA, HTN, diastolic heart failure (EF 60-65% and grade 1 diastolic dysfunction on 08/7780 ECHO), and GERD who presented to the ED with right sided trunk and back pain after she fell tried to sit down into her wheelchair. Pt also frequently misses dialysis sessions. She reports that she was last dialyzed on Wednesday 12/18.  MRI revealed T12-L1 discitis osteomyelitis with ventral epidural abscess mildly flattening the lower cord   OT comments  Pt refused any self participation in selfcare and would not sit EOB for assistance with bathing, pt only agreeable to rolling in bed for OT to bathe her. Pt stated that she was not getting up due to back pain. OT educated pt on importance of sitting EOB, participating in her ADLs and OOB activity. Pt continued to refuse and stated "get the hell out of my room". Per PT, pt participated earlier this morning.   Follow Up Recommendations  SNF;Supervision/Assistance - 24 hour    Equipment Recommendations  None recommended by OT    Recommendations for Other Services      Precautions / Restrictions Precautions Precautions: Fall Precaution Comments: back precautions for comfort Restrictions Weight Bearing Restrictions: No       Mobility Bed Mobility Overal bed mobility: Needs Assistance Bed Mobility: Supine to Sit;Sit to Supine Rolling: Min guard Sidelying to sit: Min guard;Min assist Supine to sit: Min assist Sit to supine: Min assist Sit to sidelying: Min assist General bed mobility comments: pt refused to sit EOB, only rolling for assist with bathing  Transfers Overall transfer level: Needs assistance Equipment used: Rolling walker (2 wheeled);1 person hand held assist Transfers: Sit to/from Stand Sit to Stand: Min assist          General transfer comment: pt refused    Balance     Sitting balance-Leahy Scale: Fair       Standing balance-Leahy Scale: Poor Standing balance comment: requires less assist to maintain standing                           ADL either performed or assessed with clinical judgement   ADL Overall ADL's : Needs assistance/impaired                                       General ADL Comments: pt refused any self participation and would not sit EOB for assistance with bathing     Vision Patient Visual Report: No change from baseline     Perception     Praxis      Cognition Arousal/Alertness: Awake/alert Behavior During Therapy: Agitated Overall Cognitive Status: Impaired/Different from baseline Area of Impairment: Attention;Memory;Following commands;Safety/judgement;Awareness;Problem solving;Orientation                 Orientation Level: Time;Situation Current Attention Level: Selective Memory: Decreased recall of precautions;Decreased short-term memory Following Commands: Follows one step commands inconsistently;Follows one step commands with increased time Safety/Judgement: Decreased awareness of safety;Decreased awareness of deficits Awareness: Intellectual Problem Solving: Slow processing;Decreased initiation;Requires verbal cues;Requires tactile cues General Comments: pt is asking for the locked walker and asking for her husband to be present        Exercises     Shoulder  Instructions       General Comments pt is able to move better, no pain complaints nor does she have expressions of pain.  Very easily agitated unless pt is making the decisions about her movement    Pertinent Vitals/ Pain       Pain Assessment: Faces Pain Score: 0-No pain Faces Pain Scale: Hurts even more Pain Location: Back Pain Descriptors / Indicators: Grimacing;Moaning Pain Intervention(s): Monitored during session  Home Living                                           Prior Functioning/Environment              Frequency  Min 2X/week        Progress Toward Goals  OT Goals(current goals can now be found in the care plan section)  Progress towards OT goals: Not progressing toward goals - comment(pt easiy agitated, limited by back pain)  Acute Rehab OT Goals Patient Stated Goal: get a bath  Plan Discharge plan remains appropriate    Co-evaluation                 AM-PAC OT "6 Clicks" Daily Activity     Outcome Measure   Help from another person eating meals?: None Help from another person taking care of personal grooming?: A Little Help from another person toileting, which includes using toliet, bedpan, or urinal?: Total Help from another person bathing (including washing, rinsing, drying)?: A Lot Help from another person to put on and taking off regular upper body clothing?: A Lot Help from another person to put on and taking off regular lower body clothing?: Total 6 Click Score: 13    End of Session    OT Visit Diagnosis: Muscle weakness (generalized) (M62.81)   Activity Tolerance     Patient Left in bed;with call bell/phone within reach;with bed alarm set   Nurse Communication          Time: 1610-9604 OT Time Calculation (min): 11 min  Charges: OT General Charges $OT Visit: 1 Visit OT Treatments $Therapeutic Activity: 8-22 mins     [name] 07/24/2018, 11:27 AM

## 2018-07-24 NOTE — Progress Notes (Signed)
Physical Therapy Treatment Patient Details Name: Nancy [name] MRN: [id number] DOB: 05/08/1941 Today's Date: 07/24/2018    History of Present Illness 78 yo female with a medical history of ESRD on HD MWF, CVA, HTN, diastolic heart failure (EF 60-65% and grade 1 diastolic dysfunction on 0/8144 ECHO), and GERD who presented to the ED with right sided trunk and back pain after she fell tried to sit down into her wheelchair. Pt also frequently misses dialysis sessions. She reports that she was last dialyzed on Wednesday 12/18.  MRI revealed T12-L1 discitis osteomyelitis with ventral epidural abscess mildly flattening the lower cord    PT Comments    Pt was initially very willing to get OOB and try to stand, and upon PT returning from a short time was completely unwilling to get up.  Pt is quite confused but does follow instructions when she is willing to try to move.  Will continue to get up and use RW as pt will allow, and her light headed feelings may be a result of drop in BP with standing but cannot get her to agree to be up to try BP standing.  Continue as pt will allow.   Follow Up Recommendations  SNF     Equipment Recommendations  None recommended by PT    Recommendations for Other Services       Precautions / Restrictions Precautions Precautions: Fall Precaution Comments: back precautions for comfort Restrictions Weight Bearing Restrictions: No    Mobility  Bed Mobility Overal bed mobility: Needs Assistance Bed Mobility: Supine to Sit;Sit to Supine Rolling: Min guard Sidelying to sit: Min guard;Min assist Supine to sit: Min assist Sit to supine: Min assist Sit to sidelying: Min assist General bed mobility comments: pt refused to sit EOB, only rolling for assist with bathing  Transfers Overall transfer level: Needs assistance Equipment used: Rolling walker (2 wheeled);1 person hand held assist Transfers: Sit to/from Stand Sit to Stand: Min assist         General  transfer comment: pt refused  Ambulation/Gait             General Gait Details: declined   Stairs             Wheelchair Mobility    Modified Rankin (Stroke Patients Only)       Balance     Sitting balance-Leahy Scale: Fair       Standing balance-Leahy Scale: Poor Standing balance comment: requires less assist to maintain standing                            Cognition Arousal/Alertness: Awake/alert Behavior During Therapy: Agitated Overall Cognitive Status: Impaired/Different from baseline Area of Impairment: Attention;Memory;Following commands;Safety/judgement;Awareness;Problem solving;Orientation                 Orientation Level: Time;Situation Current Attention Level: Selective Memory: Decreased recall of precautions;Decreased short-term memory Following Commands: Follows one step commands inconsistently;Follows one step commands with increased time Safety/Judgement: Decreased awareness of safety;Decreased awareness of deficits Awareness: Intellectual Problem Solving: Slow processing;Decreased initiation;Requires verbal cues;Requires tactile cues General Comments: pt is asking for the locked walker and asking for her husband to be present      Exercises      General Comments General comments (skin integrity, edema, etc.): pt is able to move better, no pain complaints nor does she have expressions of pain.  Very easily agitated unless pt is making the decisions about her movement  Pertinent Vitals/Pain Pain Assessment: Faces Pain Score: 0-No pain Faces Pain Scale: Hurts even more Pain Location: Back Pain Descriptors / Indicators: Grimacing;Moaning Pain Intervention(s): Monitored during session    Home Living                      Prior Function            PT Goals (current goals can now be found in the care plan section) Acute Rehab PT Goals Patient Stated Goal: get a bath Progress towards PT goals:  Progressing toward goals    Frequency    Min 2X/week      PT Plan Current plan remains appropriate    Co-evaluation              AM-PAC PT "6 Clicks" Mobility   Outcome Measure  Help needed turning from your back to your side while in a flat bed without using bedrails?: A Little Help needed moving from lying on your back to sitting on the side of a flat bed without using bedrails?: A Little Help needed moving to and from a bed to a chair (including a wheelchair)?: A Little Help needed standing up from a chair using your arms (e.g., wheelchair or bedside chair)?: A Little Help needed to walk in hospital room?: Total Help needed climbing 3-5 steps with a railing? : Total 6 Click Score: 14    End of Session Equipment Utilized During Treatment: Gait belt Activity Tolerance: Patient tolerated treatment well;Treatment limited secondary to medical complications (Comment)(light headed feelings) Patient left: in bed;with call bell/phone within reach;with bed alarm set Nurse Communication: Mobility status PT Visit Diagnosis: Other abnormalities of gait and mobility (R26.89);Difficulty in walking, not elsewhere classified (R26.2);Muscle weakness (generalized) (M62.81)     Time: 9509-3267(1245 to 1030) PT Time Calculation (min) (ACUTE ONLY): 17 min  Charges:  $Therapeutic Activity: 23-37 mins                      Ramond Dial 07/24/2018, 12:31 PM  Mee Hives, PT MS Acute Rehab Dept. Number: [place] and [place]

## 2018-07-24 NOTE — Progress Notes (Signed)
  Date: 07/24/2018  Patient name: Nancy [name]  Medical record number: [id number]  Date of birth: Apr 28, 1941   I have seen and evaluated this patient and I have discussed the plan of care with the house staff. Please see their note for complete details. I concur with their findings with the following additions/corrections:   Relatively successful dialysis session yesterday, seems a bit confused today, was looking for a cup of pudding that she thought she had dropped in her bed.  She said she was not hungry and her breakfast tray looked untouched.  She likely requires assistance with eating, order is in place for assistance with eating.  She is ready for transition to outpatient dialysis when placement is available.  [name], M.D., Ph.D. 07/24/2018, 1:50 PM

## 2018-07-24 NOTE — Progress Notes (Signed)
[facility] Progress Note   Dialysis Orders: [place] MWF 3.5 hrs 180NRe 400/Autoflow 1.5 45.5 kg 3.0 K/ 2.0 Ca  LUA AVF -No heparin -Mircera 225 mcg IV (Last dose 06/02/2018 Last HGB 10.2 06/22/18) -Calcitriol 0.25 mcg PO TIW (Last PTH 136 06/02/18 Last Phos 8.1 06/22/18)  Assessment/Plan: 1.S/p mechanical fall, osteomyelitis/discitis of T12-L1 w/ventral epidural abscess - On 6-8 week course cefepime -now on oral ciproPer neuro needs repeat MRI prior to d/c of antibiotic therapy. Per primary.PT evaluating. 2. ESRD -On HD MWF- now on TTS. K 4.5.Safety wasa big issue, patient is disruptive and pulled out her arterial needle during last week.Family would like to continue dialysis for now. They have agreed to sit with patient 1:1 during inpatient HD on TTS schedule, 1st shift preferably- now on zyprexa as well.  Improvement in behavior for last 3-4 HD as long as family was present. Should be safe to return to unit. Plan HD Saturday.   3. Anemia of CKD-Hgb stable,10.5 . Continue ESA. Improved last check on Aranesp 60 q Tuesday 4. Secondary hyperparathyroidism -Significant hypercalcemia 1/16 at 11 uncorrected- prior to admission on calcitriol 0.25 - the last she was on high dose (1.75) calcitriol was in November but this was markedly reduced due to hypercalcemia.  Last iPTH 136 November - plan stop calcitriol for now- can resume ant 0.25 - 0.5 when corrected Ca comes down.  Using 2 Ca bath. Ca levels have been quite variable here. On [name]. 5. HTN/volume -BP/volume stable. Net UF Thrusday 1 L post wt 38 kg  6.Malnutrition/FTT - Alb low. Diet liberalized w/fluid restriction. On Nepro/Prostat. Continues to lose weight.  7. Dementia/Agitation/Delirium - Psych eval on 07/09/2018 stated she lacks capacity regarding medical decisions. On Zyprexa for agitation. Daughter is POC. Palliative care consulted but family would like to continue HD. 8. Hx CVA/AS/mod MS - EF ok 9.  Dispo - Poor prognosis w/poor QOL. Talked about transition to comfort care in setting of advanced dementia and behavior issues onHD.  Family meeting on 07/18/18 w/ palliative &Dr. Jonnie Finner, family would like to continue dialysis for now and commit to being present for all inpatient HD in addition to zyprexa. Dr. Joelyn Oms agreeable to take pt back. Working on SNF placement.  Myriam Jacobson, PA-C [facility] Beeper [phone] 07/24/2018,9:24 AM  LOS: 21 days   Subjective:   No c/o   Objective Vitals:   07/23/18 1833 07/23/18 2015 07/24/18 0648 07/24/18 0819  BP: 126/65 (!) 135/56 (!) 125/56 (!) 125/54  Pulse: (!) 108 (!) 107 (!) 107 (!) 106  Resp: 18 20 18 16   Temp: (!) 97.4 F (36.3 C) (!) 97.5 F (36.4 C) (!) 97.5 F (36.4 C) 97.8 F (36.6 C)  TempSrc: Oral Oral Oral Oral  SpO2: 100% 100% 100% 100%  Weight:  38 kg    Height:       Physical Exam General: emaciated elderly femaleNAD breathing easily about to work with PT Heart: RRR 2/6 murmur Lungs: clear without rales Abdomen: scaphoid soft NT + BS Extremities: no LE edema Dialysis Access:  Left upper AVF + bruit - thickened dark skin over AVF   Additional Objective Labs: Basic Metabolic Panel: Recent Labs  Lab 07/19/18 1700 07/21/18 0932 07/23/18 0825  NA 137 137 138  K 3.6 4.5 4.8  CL 101 102 100  CO2 28 28 29   GLUCOSE 91 126* 114*  BUN 19 23 16   CREATININE 4.65* 5.55* 4.51*  CALCIUM 8.7* 9.6 11.0*  PHOS 2.7  3.9 3.4   Liver Function Tests: Recent Labs  Lab 07/19/18 1700 07/21/18 0932 07/23/18 0825  ALBUMIN 1.9* 1.8* 2.2*   No results for input(s): LIPASE, AMYLASE in the last 168 hours. CBC: Recent Labs  Lab 07/19/18 1700 07/21/18 0933 07/23/18 0825  WBC 1.8* 2.8* 4.6  HGB 9.3* 9.0* 10.6*  HCT 31.9* 30.5* 35.5*  MCV 97.9 99.7 99.2  PLT 116* 122* 153   Blood Culture    Component Value Date/Time   SDES ABSCESS T12 07/04/2018 1626   SPECREQUEST NONE 07/04/2018 1626   CULT   07/04/2018 1626    RARE PSEUDOMONAS AERUGINOSA NO ANAEROBES ISOLATED Performed at [facility], [place] [address] [place], [place] [address]    REPTSTATUS 07/09/2018 FINAL 07/04/2018 1626    Cardiac Enzymes: No results for input(s): CKTOTAL, CKMB, CKMBINDEX, TROPONINI in the last 168 hours. CBG: Recent Labs  Lab 07/18/18 2052  GLUCAP 103*   Iron Studies: No results for input(s): IRON, TIBC, TRANSFERRIN, FERRITIN in the last 72 hours. Lab Results  Component Value Date   INR 1.13 03/29/2018   INR 1.03 03/06/2018   INR 1.30 02/17/2017   Studies/Results: No results found. Medications:  . aspirin EC  81 mg Oral Daily  . calcitRIOL  2 mcg Oral Q T,Th,Sa-HD  . Chlorhexidine Gluconate Cloth  6 each Topical Q0600  . ciprofloxacin  500 mg Oral Daily  . darbepoetin (ARANESP) injection - DIALYSIS  60 mcg Intravenous Q Tue-HD  . feeding supplement (NEPRO CARB STEADY)  237 mL Oral BID BM  . feeding supplement (PRO-STAT SUGAR FREE 64)  30 mL Oral BID  . ferric citrate  420 mg Oral TID WC  . heparin  5,000 Units Subcutaneous Q8H  . latanoprost  1 drop Both Eyes QHS  . midodrine  10 mg Oral Once per day on Tue Thu Sat  . multivitamin  1 tablet Oral QHS  . OLANZapine  2.5 mg Oral QHS  . polyethylene glycol  17 g Oral Daily  . senna-docusate  1 tablet Oral BID

## 2018-07-25 LAB — RENAL FUNCTION PANEL
Albumin: 2.2 g/dL — ABNORMAL LOW (ref 3.5–5.0)
Anion gap: 9 (ref 5–15)
BUN: 22 mg/dL (ref 8–23)
CO2: 26 mmol/L (ref 22–32)
Calcium: 10.6 mg/dL — ABNORMAL HIGH (ref 8.9–10.3)
Chloride: 102 mmol/L (ref 98–111)
Creatinine, Ser: 4.31 mg/dL — ABNORMAL HIGH (ref 0.44–1.00)
GFR calc Af Amer: 11 mL/min — ABNORMAL LOW (ref >60)
GFR calc non Af Amer: 9 mL/min — ABNORMAL LOW (ref >60)
Glucose, Bld: 84 mg/dL (ref 70–99)
Phosphorus: 4.6 mg/dL (ref 2.5–4.6)
Potassium: 5.2 mmol/L — ABNORMAL HIGH (ref 3.5–5.1)
Sodium: 137 mmol/L (ref 135–145)

## 2018-07-25 LAB — CBC
HCT: 33.9 % — ABNORMAL LOW (ref 36.0–46.0)
Hemoglobin: 9.7 g/dL — ABNORMAL LOW (ref 12.0–15.0)
MCH: 28.8 pg (ref 26.0–34.0)
MCHC: 28.6 g/dL — ABNORMAL LOW (ref 30.0–36.0)
MCV: 100.6 fL — ABNORMAL HIGH (ref 80.0–100.0)
Platelets: 164 10*3/uL (ref 150–400)
RBC: 3.37 MIL/uL — ABNORMAL LOW (ref 3.87–5.11)
RDW: 18.8 % — ABNORMAL HIGH (ref 11.5–15.5)
WBC: 6.8 10*3/uL (ref 4.0–10.5)
nRBC: 0 % (ref 0.0–0.2)

## 2018-07-25 MED ORDER — HEPARIN SODIUM (PORCINE) 1000 UNIT/ML DIALYSIS: 1000.0000 [IU] | INTRAMUSCULAR | Status: DC | PRN

## 2018-07-25 MED ORDER — SODIUM CHLORIDE 0.9 % IV SOLN: 100.0000 mL | INTRAVENOUS | Status: DC | PRN

## 2018-07-25 MED ORDER — LIDOCAINE HCL (PF) 1 % IJ SOLN: 5.0000 mL | INTRAMUSCULAR | Status: DC | PRN

## 2018-07-25 MED ORDER — LIDOCAINE-PRILOCAINE 2.5-2.5 % EX CREA: 1.0000 "application " | TOPICAL_CREAM | CUTANEOUS | Status: DC | PRN

## 2018-07-25 MED ORDER — PENTAFLUOROPROP-TETRAFLUOROETH EX AERO: 1.0000 "application " | INHALATION_SPRAY | CUTANEOUS | Status: DC | PRN

## 2018-07-25 MED ORDER — ALTEPLASE 2 MG IJ SOLR: 2.0000 mg | Freq: Once | INTRAMUSCULAR | Status: DC | PRN

## 2018-07-25 NOTE — NC FL2 (Signed)
[place] LEVEL OF CARE SCREENING TOOL     IDENTIFICATION  Patient Name: Nancy [name] Birthdate: [birth year] Sex: female Admission Date (Current Location): 07/03/2018  [place] and [place] Number:  Nancy [name] [id number] [place] and Address:  [facility] [place], [place] [address] [place],  [address]      Provider Number: [id number]  Attending Physician Name and Address:  Annia Belt, MD  Relative Name and Phone Number:  Nancy [name] daughter, [place], Daughter, [phone]    Current Level of Care: Hospital Recommended Level of Care: [place] Prior Approval Number: [id number]  Date Approved/Denied: 07/25/18 PASRR Number: [id number](Eff. 05/05/18)  Discharge Plan: SNF    Current Diagnoses: Patient Active Problem List   Diagnosis Date Noted  . Pancytopenia ([place])   . Discitis of thoracolumbar region   . Psychomotor agitation   . Epidural abscess 07/04/2018  . Acute osteomyelitis of thoracic spine ([place]) 07/03/2018  . DNR (do not resuscitate) discussion   . Right renal mass   . Chronic cough 04/17/2018  . Acute gastritis without hemorrhage   . Duodenitis with bleeding   . ESRD (end stage renal disease) ([place])   . Papular lichenification 42/59/5638  . Acute on chronic blood loss anemia 03/06/2018  . Palliative care by specialist   . Protein-calorie malnutrition, severe 01/12/2018  . Midline thoracic back pain 12/23/2017  . Symptomatic anemia 02/17/2017  . Secondary hyperparathyroidism of renal origin ([place]) 01/15/2017  . Mitral valve annular calcification   . Dizziness 12/08/2016  . Angina at rest [facility])   . Goals of care, counseling/discussion   . Palliative care encounter   . Aortic stenosis 05/21/2016  . Anemia associated with chronic renal failure 02/01/2016  . Atherosclerosis of aorta ([place]) 01/11/2015  . End stage renal disease ([place]) 02/04/2013  . Non-intractable vomiting  with nausea 08/17/2012  . Glaucoma 03/18/2012  . Health care maintenance 09/17/2011  . Osteoarthrosis involving lower leg 10/31/2008  . History of CVA (cerebrovascular accident) 01/28/2008  . Hyperlipidemia 02/13/2007  . PULMONARY NODULES 01/12/2007  . Left ventricular hypertrophy 09/02/2006  . Essential hypertension 08/11/2006  . GERD 08/11/2006    Orientation RESPIRATION BLADDER Height & Weight     Self  Normal Continent Weight: 83 lb 12.4 oz (38 kg) Height:  5\' 3"  (160 cm)  BEHAVIORAL SYMPTOMS/MOOD NEUROLOGICAL BOWEL NUTRITION STATUS      Continent Diet(Diet dys 3, thin liquids)  AMBULATORY STATUS COMMUNICATION OF NEEDS Skin   Limited Assist Verbally Normal(Dry skin has benadryl cream)                       Personal Care Assistance Level of Assistance  Bathing, Feeding, Dressing, Total care Bathing Assistance: Limited assistance Feeding assistance: Limited assistance Dressing Assistance: Limited assistance Total Care Assistance: Limited assistance   Functional Limitations Info  Sight, Hearing, Speech Sight Info: Adequate Hearing Info: Impaired Speech Info: Adequate    SPECIAL CARE FACTORS FREQUENCY  PT (By licensed PT), OT (By licensed OT)     PT Frequency: 5x/wk OT Frequency: 5x/wk            Contractures Contractures Info: Not present    Additional Factors Info  Code Status, Allergies, Insulin Sliding Scale Code Status Info: DNR Allergies Info: Hydrocodone, Tape   Insulin Sliding Scale Info: Heaprin 1000 units as needed and heparin injection 5,000 units every 8 hours       Current Medications (07/25/2018):  This is  the current hospital active medication list Current Facility-Administered Medications  Medication Dose Route Frequency Provider Last Rate Last Dose  . 0.9 %  sodium chloride infusion  100 mL Intravenous PRN Alric Seton, PA-C      . 0.9 %  sodium chloride infusion  100 mL Intravenous PRN Alric Seton, PA-C      . acetaminophen  (TYLENOL) tablet 650 mg  650 mg Oral Q6H PRN Chundi, Vahini, MD   650 mg at 07/10/18 1941   Or  . acetaminophen (TYLENOL) suppository 650 mg  650 mg Rectal Q6H PRN Chundi, Vahini, MD      . alteplase (CATHFLO ACTIVASE) injection 2 mg  2 mg Intracatheter Once PRN Alric Seton, PA-C      . aspirin EC tablet 81 mg  81 mg Oral Daily Chundi, Vahini, MD   81 mg at 07/25/18 0804  . Chlorhexidine Gluconate Cloth 2 % PADS 6 each  6 each Topical Q0600 [place], [place], [place]      . Chlorhexidine Gluconate Cloth 2 % PADS 6 each  6 each Topical Q0600 Alric Seton, PA-C   6 each at 07/24/18 1145  . ciprofloxacin (CIPRO) tablet 500 mg  500 mg Oral Daily Dorrell, [name], MD   500 mg at 07/24/18 1800  . Darbepoetin Alfa (ARANESP) injection 60 mcg  60 mcg Intravenous Q Tue-HD Lucious Groves, DO   60 mcg at 07/21/18 0946  . diphenhydrAMINE-zinc acetate (BENADRYL) 2-0.1 % cream   Topical TID PRN Ina Homes, MD      . feeding supplement (NEPRO CARB STEADY) liquid 237 mL  237 mL Oral BID BM Alric Seton, PA-C   237 mL at 07/20/18 1355  . feeding supplement (PRO-STAT SUGAR FREE 64) liquid 30 mL  30 mL Oral BID Valentina Gu, NP   30 mL at 07/19/18 1041  . ferric citrate (AURYXIA) tablet 420 mg  420 mg Oral TID WC Valentina Gu, NP   420 mg at 07/25/18 0804  . heparin injection 1,000 Units  1,000 Units Dialysis PRN Alric Seton, PA-C      . heparin injection 5,000 Units  5,000 Units Subcutaneous Q8H Lars Mage, MD   5,000 Units at 07/24/18 0601  . HYDROmorphone (DILAUDID) tablet 2 mg  2 mg Oral Q4H PRN Ina Homes, MD   2 mg at 07/22/18 2159  . latanoprost (XALATAN) 0.005 % ophthalmic solution 1 drop  1 drop Both Eyes QHS Chundi, Vahini, MD   1 drop at 07/24/18 2222  . lidocaine (PF) (XYLOCAINE) 1 % injection 5 mL  5 mL Intradermal PRN Alric Seton, PA-C      . lidocaine-prilocaine (EMLA) cream 1 application  1 application Topical PRN Alric Seton, PA-C      . midodrine  (PROAMATINE) tablet 10 mg  10 mg Oral Once per day on Tue Thu Sat Rosita Fire, MD   10 mg at 07/25/18 0804  . multivitamin (RENA-VIT) tablet 1 tablet  1 tablet Oral Nile Riggs, MD   1 tablet at 07/22/18 2158  . OLANZapine (ZYPREXA) tablet 2.5 mg  2.5 mg Oral QHS Helberg, Larkin Ina, MD   2.5 mg at 07/24/18 2222  . pentafluoroprop-tetrafluoroeth (GEBAUERS) aerosol 1 application  1 application Topical PRN Alric Seton, PA-C      . polyethylene glycol (MIRALAX / GLYCOLAX) packet 17 g  17 g Oral Daily Ina Homes, MD   17 g at 07/25/18 0804  . promethazine (PHENERGAN) tablet 12.5 mg  12.5 mg Oral  Q6H PRN Lars Mage, MD   12.5 mg at 07/23/18 0759  . senna-docusate (Senokot-S) tablet 1 tablet  1 tablet Oral BID Ina Homes, MD   1 tablet at 07/25/18 0804     Discharge Medications: Please see discharge summary for a list of discharge medications.  Relevant Imaging Results:  Relevant Lab Results:   Additional Information SSN: [id number]  [place], LCSWA

## 2018-07-25 NOTE — Progress Notes (Addendum)
   Subjective: No overnight events. Nancy [name] reports pain in her back this morning. She also states that she is at [facility] will be fixing her breakfast shortly. She was reoriented and told that she will be getting dialysis later today.  Objective:  Vital signs in last 24 hours: Vitals:   07/24/18 0648 07/24/18 0819 07/24/18 1759 07/24/18 2034  BP: (!) 125/56 (!) 125/54 126/62 (!) 136/59  Pulse: (!) 107 (!) 106 (!) 106 (!) 108  Resp: 18 16 17 18   Temp: (!) 97.5 F (36.4 C) 97.8 F (36.6 C) 97.8 F (36.6 C) 97.9 F (36.6 C)  TempSrc: Oral Oral Oral Oral  SpO2: 100% 100% 100% 100%  Weight:    39 kg  Height:       Gen: seen laying comfortably in bed, nad Neuro: bilateral LE strength 5/5 Extremities: no edema  Assessment/Plan:  Principal Problem:   Acute osteomyelitis of thoracic spine (HCC) Active Problems:   Palliative care encounter   ESRD (end stage renal disease) ([place])   Epidural abscess   Psychomotor agitation   Discitis of thoracolumbar region   Pancytopenia ([place])  Nancy [name] is a8 yoF with ESRD on HD MWF, CVA,and HTN presenting with right sided trunk/back pain after a fall, found to have osteomylitis/discitis of T12-L1 with ventral epidural abscess. She was subsequently admitted for further evaluation and management.  Osteomyelitis with ventral epidural abscess -Stable. Afebrile. - Initially on Cefepime with HD, but this dosing was sporadic and intermittent due to her inability to tolerate HD. She received 2g Cefepime on 12/28, 12/29, 12/30, 1/3, and 1/6. She was transitioned to [facility] on 1/9 for more regular dosing. Will require 6-8 weeks of therapy. Plan -Cipro 500mg  daily - Repeat MRIafter 6 weeks of antibiotic therapy(around 08/21/2018) to guide further antimicrobial therapy -ContinuePO dilaudid PRN - PT/OTeval and treat  ESRD on HD (TTS) - Last dialysis 1/16 -Patient has tolerated her last three sessions of HD with  family present. Nephrology now considers her appropriate for outpatient dialysis.  - Management of chronic anemia and hypercalcemia per nephrology Plan  -Nephrology and palliative care following, appreciate recs - Midodrine on dialysis days for hypotension around sessions - Social work consulted for help with placement - Dialysis today  Dementia and Agitation -Dementia with behavioral disturbances, particularly during dialysis have improved.  -Found to not have capacity by psychiatry on 1/2. Appreciate recommendation - Appears calmer with scheduled Zyprexa. Plan -ScheduledZyprexa 2.5mg qhs  Dispo:Family would like to continue HD and commit to being present at every HD session.Nephrology now considers her appropriate for outpatient dialysis. Discharge pending SNF placement.  , [name], MD 07/25/2018, 6:28 AM Pager: [phone]

## 2018-07-25 NOTE — Progress Notes (Signed)
[facility] Progress Note   Dialysis Orders: [facility] MWF 3.5 hrs 180NRe 400/Autoflow 1.5 45.5 kg 3.0 K/ 2.0 Ca  LUA AVF -No heparin -Mircera 225 mcg IV (Last dose 06/02/2018 Last HGB 10.2 06/22/18) -Calcitriol 0.25 mcg PO TIW (Last PTH 136 06/02/18 Last Phos 8.1 06/22/18)  Assessment/Plan: 1.S/p mechanical fall, osteomyelitis/discitis of T12-L1 w/ventral epidural abscess - On 6-8 week course cefepime -now on oral ciproPer neuro needs repeat MRI prior to d/c of antibiotic therapy. PT evaluating. 2. ESRD -On HD MWF- now on TTS. K 4.5.Safety wasa big issue, patient was disruptive and pulled out her arterial needle.Family would like to continue dialysis, they have agreed to sit with patient 1:1 during inpatient HD on TTS schedule, 1st shift preferably- now on zyprexa as well.  Improvement in behavior for last 3-4 HD as long as family was present. Should be safe to return to unit. Cont HD on schedule  3. Anemia of CKD-Hgb improved,10.6 . Continue ESA. Improved last check on Aranesp 60 q Tuesday 4. Secondary hyperparathyroidism -Significant hypercalcemia at 11. Last iPTH 136 November - plan stop calcitriol for now- can resume ant 0.25 - 0.5 when corrected Ca comes down.  Using 2 Ca bath. On [name] as well 5. HTN/volume -BP/volume stable. Net UF Thrusday 1 L post wt 38 kg  6.Malnutrition/FTT - Alb low. Diet liberalized w/fluid restriction. On Nepro/Prostat. Continues to lose weight.  7. Dementia/Agitation/Delirium - Psych eval on 07/09/2018 stated she lacks capacity regarding medical decisions. On Zyprexa for agitation. Daughter is POC. Palliative care consulted but family would like to continue HD. 8. Hx CVA/AS/mod MS - EF ok 9. Dispo - Talked about transition to comfort care in setting of advanced dementia and behavior issues onHD.  Family meeting on 07/18/18 w/ palliative &Dr. Jonnie Finner, family would like to continue dialysis for now and commit to being present  for all inpatient HD in addition to zyprexa. Dr. Joelyn Oms agreeable to take pt back. Working on SNF placement.  Louis Meckel  Subjective:   Seen in HD- No c/o- family present- no news on placement  - pt has not been participating with therapies which is problematic   Objective Vitals:   07/24/18 0819 07/24/18 1759 07/24/18 2034 07/25/18 0810  BP: (!) 125/54 126/62 (!) 136/59 (!) 126/44  Pulse: (!) 106 (!) 106 (!) 108 (!) 102  Resp: 16 17 18 17   Temp: 97.8 F (36.6 C) 97.8 F (36.6 C) 97.9 F (36.6 C) 97.6 F (36.4 C)  TempSrc: Oral Oral Oral Oral  SpO2: 100% 100% 100% 100%  Weight:   39 kg   Height:       Physical Exam General: emaciated elderly femaleNAD breathing easily  Heart: RRR 2/6 murmur Lungs: clear without rales Abdomen: scaphoid soft NT + BS Extremities: no LE edema Dialysis Access:  Left upper AVF + bruit - thickened dark skin over AVF   Additional Objective Labs: Basic Metabolic Panel: Recent Labs  Lab 07/19/18 1700 07/21/18 0932 07/23/18 0825  NA 137 137 138  K 3.6 4.5 4.8  CL 101 102 100  CO2 28 28 29   GLUCOSE 91 126* 114*  BUN 19 23 16   CREATININE 4.65* 5.55* 4.51*  CALCIUM 8.7* 9.6 11.0*  PHOS 2.7 3.9 3.4   Liver Function Tests: Recent Labs  Lab 07/19/18 1700 07/21/18 0932 07/23/18 0825  ALBUMIN 1.9* 1.8* 2.2*   No results for input(s): LIPASE, AMYLASE in the last 168 hours. CBC: Recent Labs  Lab 07/19/18 1700 07/21/18 0933  07/23/18 0825  WBC 1.8* 2.8* 4.6  HGB 9.3* 9.0* 10.6*  HCT 31.9* 30.5* 35.5*  MCV 97.9 99.7 99.2  PLT 116* 122* 153   Blood Culture    Component Value Date/Time   SDES ABSCESS T12 07/04/2018 1626   SPECREQUEST NONE 07/04/2018 1626   CULT  07/04/2018 1626    RARE PSEUDOMONAS AERUGINOSA NO ANAEROBES ISOLATED Performed at [facility], [place] [address] [place], [place] [address]    REPTSTATUS 07/09/2018 FINAL 07/04/2018 1626    Cardiac Enzymes: No results for input(s): CKTOTAL, CKMB,  CKMBINDEX, TROPONINI in the last 168 hours. CBG: Recent Labs  Lab 07/18/18 2052  GLUCAP 103*   Iron Studies: No results for input(s): IRON, TIBC, TRANSFERRIN, FERRITIN in the last 72 hours. Lab Results  Component Value Date   INR 1.13 03/29/2018   INR 1.03 03/06/2018   INR 1.30 02/17/2017   Studies/Results: No results found. Medications: . sodium chloride    . sodium chloride     . aspirin EC  81 mg Oral Daily  . Chlorhexidine Gluconate Cloth  6 each Topical Q0600  . Chlorhexidine Gluconate Cloth  6 each Topical Q0600  . ciprofloxacin  500 mg Oral Daily  . darbepoetin (ARANESP) injection - DIALYSIS  60 mcg Intravenous Q Tue-HD  . feeding supplement (NEPRO CARB STEADY)  237 mL Oral BID BM  . feeding supplement (PRO-STAT SUGAR FREE 64)  30 mL Oral BID  . ferric citrate  420 mg Oral TID WC  . heparin  5,000 Units Subcutaneous Q8H  . latanoprost  1 drop Both Eyes QHS  . midodrine  10 mg Oral Once per day on Tue Thu Sat  . multivitamin  1 tablet Oral QHS  . OLANZapine  2.5 mg Oral QHS  . polyethylene glycol  17 g Oral Daily  . senna-docusate  1 tablet Oral BID

## 2018-07-25 NOTE — Progress Notes (Signed)
Medicine attending: Clinical status and medical database reviewed with resident physician Dr Vilma Prader and I concur with her evaluation and management plan. No acute change in status.  She continues on a prolonged course of antibiotics for Pseudomonas osteomyelitis of the T12-L1 spine.  Much more calm disposition on low-dose Zyprexa.  She continues on dialysis for end-stage renal disease. Physical therapy proceeding slowly due to patient compliance issues. Patient has a bed offer at [facility].  Care management team will initiate insurance authorization on Monday.  (Probably Tuesday with the [facility] holiday.)

## 2018-07-25 NOTE — Procedures (Signed)
Patient was seen on dialysis and the procedure was supervised.  BFR 400  Via AVF BP is  130/59.   Patient appears to be tolerating treatment well  Louis Meckel 07/25/2018

## 2018-07-25 NOTE — Progress Notes (Signed)
CSW spoke with patient's granddaughter. Family would like to send the patient to [facility] once medically stable.   CSW spoke with Olivia Mackie at [facility], they have made a bed offer for the patient. [facility] will start insurance authorization on Monday.   CSW will continue to follow.   Domenic Schwab, MSW, [place]

## 2018-07-26 NOTE — Progress Notes (Signed)
[facility] Progress Note   Dialysis Orders: [facility] MWF 3.5 hrs 180NRe 400/Autoflow 1.5 45.5 kg 3.0 K/ 2.0 Ca  LUA AVF -No heparin -Mircera 225 mcg IV (Last dose 06/02/2018 Last HGB 10.2 06/22/18) -Calcitriol 0.25 mcg PO TIW (Last PTH 136 06/02/18 Last Phos 8.1 06/22/18)  Assessment/Plan: 1.S/p mechanical fall, osteomyelitis/discitis of T12-L1 w/ventral epidural abscess - On 6-8 week course cefepime -now on oral ciproPer neuro needs repeat MRI prior to d/c of antibiotic therapy.  Variably cooperative with PT 2. ESRD -On HD MWF- now on TTS. K 4.5.Safetywasa big issue, patient was disruptive and pulled out her arterial needle.Family would like to continue dialysis, they have agreed to sit with patient 1:1 during inpatient HD on TTS schedule, 1st shift preferably- now on zyprexa as well.  Improvement in behavior for last3-4HD as long as family was present.Should be safe to return to unit. Cont HD on schedule 3. Anemia of CKD-Hgb 9>10.6 > 9.7  Continue ESA. Aranesp 60 q Tuesday 4. Secondary hyperparathyroidism -Significant hypercalcemia at 10.6. Last iPTH 136 November - plan stop calcitriol for now- can resume ant 0.25 - 0.5 when corrected Ca comes down.  Using 2 Ca bath. On [name] as well 5. HTN/volume -BP/volume stable. Net UF Thrusday 1 L post wt 38 kg - kept even Saturday. 6.Malnutrition/FTT - Alb low. Diet liberalized w/fluid restriction. On Nepro/Prostat. Continues to lose weight.  7. Dementia/Agitation/Delirium - Psych eval on 07/09/2018 stated she lacks capacity regarding medical decisions. On Zyprexa for agitation. Daughter is POC. Palliative care consulted but family would like to continue HD. 8. Hx CVA/AS/mod MS - EF ok 9. Dispo - Talked abouttransition to comfort care in setting of advanced dementia and behavior issues onHD.  Family meeting on 07/18/18 w/ palliative &Dr. Jonnie Finner, family would like to continue dialysis for now and commit to being  present for all inpatient HD in addition to zyprexa. Dr. Joelyn Oms agreeable to take pt back.  [facility]. Need to verify outpatient HD time change at [facility] on Monday.    Myriam Jacobson, PA-C [place] 07/26/2018,9:28 AM  LOS: 23 days   Subjective:   I'll eat but not now.  Objective Vitals:   07/25/18 1250 07/25/18 1813 07/25/18 2128 07/26/18 0832  BP: (!) 115/48 (!) 148/58 (!) 109/58 128/61  Pulse: 98 100 90 100  Resp: 19 16  15   Temp: 97.6 F (36.4 C) 97.8 F (36.6 C) 97.6 F (36.4 C) 97.8 F (36.6 C)  TempSrc: Oral Oral Oral Oral  SpO2: 99% 100% 100% 100%  Weight:      Height:       Physical Exam General: emaciated alert Heart: RRR 2/6 murmur Lungs: no rales Abdomen: scaphoid soft NT Extremities: no edema Dialysis Access:  Left upper AVF + bruit   Additional Objective Labs: Basic Metabolic Panel: Recent Labs  Lab 07/21/18 0932 07/23/18 0825 07/25/18 0906  NA 137 138 137  K 4.5 4.8 5.2*  CL 102 100 102  CO2 28 29 26   GLUCOSE 126* 114* 84  BUN 23 16 22   CREATININE 5.55* 4.51* 4.31*  CALCIUM 9.6 11.0* 10.6*  PHOS 3.9 3.4 4.6   Liver Function Tests: Recent Labs  Lab 07/21/18 0932 07/23/18 0825 07/25/18 0906  ALBUMIN 1.8* 2.2* 2.2*   No results for input(s): LIPASE, AMYLASE in the last 168 hours. CBC: Recent Labs  Lab 07/19/18 1700 07/21/18 0933 07/23/18 0825 07/25/18 0907  WBC 1.8* 2.8* 4.6 6.8  HGB 9.3*  9.0* 10.6* 9.7*  HCT 31.9* 30.5* 35.5* 33.9*  MCV 97.9 99.7 99.2 100.6*  PLT 116* 122* 153 164   Blood Culture    Component Value Date/Time   SDES ABSCESS T12 07/04/2018 1626   SPECREQUEST NONE 07/04/2018 1626   CULT  07/04/2018 1626    RARE PSEUDOMONAS AERUGINOSA NO ANAEROBES ISOLATED Performed at [facility], [place] [address], [place], [place] [address]    REPTSTATUS 07/09/2018 FINAL 07/04/2018 1626    Cardiac Enzymes: No results for input(s): CKTOTAL, CKMB, CKMBINDEX,  TROPONINI in the last 168 hours. CBG: No results for input(s): GLUCAP in the last 168 hours. Iron Studies: No results for input(s): IRON, TIBC, TRANSFERRIN, FERRITIN in the last 72 hours. Lab Results  Component Value Date   INR 1.13 03/29/2018   INR 1.03 03/06/2018   INR 1.30 02/17/2017   Studies/Results: No results found. Medications:  . aspirin EC  81 mg Oral Daily  . Chlorhexidine Gluconate Cloth  6 each Topical Q0600  . Chlorhexidine Gluconate Cloth  6 each Topical Q0600  . ciprofloxacin  500 mg Oral Daily  . darbepoetin (ARANESP) injection - DIALYSIS  60 mcg Intravenous Q Tue-HD  . feeding supplement (NEPRO CARB STEADY)  237 mL Oral BID BM  . feeding supplement (PRO-STAT SUGAR FREE 64)  30 mL Oral BID  . ferric citrate  420 mg Oral TID WC  . heparin  5,000 Units Subcutaneous Q8H  . latanoprost  1 drop Both Eyes QHS  . midodrine  10 mg Oral Once per day on Tue Thu Sat  . multivitamin  1 tablet Oral QHS  . OLANZapine  2.5 mg Oral QHS  . polyethylene glycol  17 g Oral Daily  . senna-docusate  1 tablet Oral BID

## 2018-07-26 NOTE — Progress Notes (Signed)
   Subjective: Patient states that she is not felling well today. Her butt hurts. We discussed that we are continue antibiotics and would like her to continue to work with PT. We are continuing to work on placement. All questions and concerns addressed.   Objective: Vital signs in last 24 hours: Vitals:   07/25/18 1250 07/25/18 1813 07/25/18 2128 07/26/18 0832  BP: (!) 115/48 (!) 148/58 (!) 109/58 128/61  Pulse: 98 100 90 100  Resp: 19 16  15   Temp: 97.6 F (36.4 C) 97.8 F (36.6 C) 97.6 F (36.4 C) 97.8 F (36.6 C)  TempSrc: Oral Oral Oral Oral  SpO2: 99% 100% 100% 100%  Weight:      Height:       Gen: Thin elderly female in no acute distress CV: RRR, Systolic murmur  Neuro: Strength 5/5 in the lower extremities  Assessment/Plan:  Principal Problem:   Acute osteomyelitis of thoracic spine (HCC) Active Problems:   Palliative care encounter   ESRD (end stage renal disease) (HCC)   Epidural abscess   Psychomotor agitation   Discitis of thoracolumbar region   Pancytopenia ([place])  Ms. Mehlman is a26 yoF with ESRD on HD MWF, CVA,and HTN presenting with right sided trunk/back pain after a fall, found to have osteomylitis/discitis of T12-L1 with ventral epidural abscess. She was subsequently admitted for further evaluation and management.  Osteomyelitis with ventral epidural abscess -Stable. Afebrile. - Initially on Cefepime with HD, but this dosing was sporadic and intermittent due to her inability to tolerate HD. She received 2g Cefepime on 12/28, 12/29, 12/30, 1/3, and 1/6. She was transitioned to [facility] on 1/9 for more regular dosing. Will require 6-8 weeks of therapy. Plan -Cipro 500mg  daily - Repeat MRIafter 6 weeks of antibiotic therapy(around 08/21/2018) to guide further antimicrobial therapy -ContinuePO dilaudid PRN - PT/OTeval and treat  ESRD on HD (TTS) - Last dialysis 1/18 -Patient has tolerated her last four sessions of HD with family present.She  is appropriate for outpatient HD. - Management of chronic anemia and hypercalcemia per nephrology Plan  -Nephrology and palliative care following, appreciate recs - Midodrine on dialysis days for hypotension around sessions - Social work consulted for help with placement  Dementia and Agitation -Dementia with behavioral disturbances, particularly during dialysishave improved. -Found to not have capacity by psychiatry on 1/2. Appreciate recommendation - Appears calmer with scheduled Zyprexa. Plan -ScheduledZyprexa 2.5mg qhs  Dispo: Anticipated discharge in 1-2 days pending insurance authorization for available bed at [facility].  Ina Homes, MD 07/26/2018, 9:57 AM Pager: [phone]

## 2018-07-26 NOTE — Progress Notes (Signed)
Medicine attending: I examined this patient today together with resident physician Dr. Ina Homes and I concur with his evaluation and management plan. Physical exam unchanged. She continues to have back pain with any movement. She has now been compliant with hemodialysis for the last week and behavioral issues currently under control. Awaiting final insurance approval but otherwise she has a skilled nursing facility bed offer. She continues on oral Cipro for osteomyelitis of the spine.

## 2018-07-27 MED ORDER — CHLORHEXIDINE GLUCONATE CLOTH 2 % EX PADS
6.0000 | MEDICATED_PAD | Freq: Every day | CUTANEOUS | Status: DC
  Administered 2018-07-27: 6 via TOPICAL

## 2018-07-27 NOTE — Progress Notes (Signed)
   Subjective: No overnight events. Nancy [name] feels well this morning. When told that she may be leaving the hospital, she exclaimed "thank you jesus." She asked if she will need surgery on her back and it was explained to her that we're treating with antibiotics and no surgery at this time. All her questions were addressed.  Objective:  Vital signs in last 24 hours: Vitals:   07/26/18 0832 07/26/18 1647 07/26/18 2004 07/26/18 2126  BP: 128/61 (!) 113/53 (!) 130/57   Pulse: 100 (!) 105 (!) 101   Resp: 15 16 16    Temp: 97.8 F (36.6 C) 97.7 F (36.5 C) (!) 97.4 F (36.3 C)   TempSrc: Oral Oral Oral   SpO2: 100% 100% 100%   Weight:    35 kg  Height:       Gen: laying comfortably in bed, no distress Neuro: 5/5 strength bilateral lower extremities. Ext: No edema.  Assessment/Plan:  Principal Problem:   Acute osteomyelitis of thoracic spine (HCC) Active Problems:   Palliative care encounter   ESRD (end stage renal disease) ([place])   Epidural abscess   Psychomotor agitation   Discitis of thoracolumbar region   Pancytopenia ([place])  Nancy [name] is a4 yoF with ESRD on HD MWF, CVA,and HTN presenting with right sided trunk/back pain after a fall, found to have osteomylitis/discitis of T12-L1 with ventral epidural abscess. She was subsequently admitted for further evaluation and management.  Osteomyelitis with ventral epidural abscess -Stable. Afebrile. - Initially on Cefepime with HD, but this dosing was sporadic and intermittent due to her inability to tolerate HD. She received 2g Cefepime on 12/28, 12/29, 12/30, 1/3, and 1/6. She was transitioned to [facility] on 1/9 for more regular dosing. Will require 6-8 weeks of antibiotic therapy. Plan -Cipro 500mg  daily - Repeat MRIafter6 weeksof antibiotic therapy(around2/14/2020) to guide further antimicrobial therapy -ContinuePO dilaudid PRN - PT/OTeval and treat  ESRD on HD (TTS) - Last dialysis 1/18 -Patient has  tolerated her last four sessions of HD with family present.She is appropriate for outpatient HD and has been accepted by Dr. Joelyn Oms. - Management of chronic anemia and hypercalcemia per nephrology Plan  -Nephrology and palliative care following, appreciate recs - Midodrine on dialysis days for hypotension around sessions - Social work consulted for help with placement  Dementia and Agitation -Dementia with behavioral disturbances, particularly during dialysishave improved. -Found to not have capacity by psychiatry on 1/2. Appreciate recommendation - Appears calmer with scheduled Zyprexa. Plan -ScheduledZyprexa 2.5mg qhs  Dispo: Anticipated discharge in 1-2 days pending insurance authorization for available bed at [facility].  Nancy [name], Nancy Elk, MD 07/27/2018, 6:40 AM Pager: [phone]

## 2018-07-27 NOTE — Progress Notes (Addendum)
[facility] Progress Note   Dialysis Orders: [facility] MWF 3.5 hrs 180NRe 400/Autoflow 1.5 45.5 kg 3.0 K/ 2.0 Ca  LUA AVF -No heparin -Mircera 225 mcg IV (Last dose 06/02/2018 Last HGB 10.2 06/22/18) -Calcitriol 0.25 mcg PO TIW (Last PTH 136 06/02/18 Last Phos 8.1 06/22/18)  Assessment/Plan: 1.S/p mechanical fall, osteomyelitis/discitis of T12-L1 w/ventral epidural abscess - On 6-8 week course cefepime -now on oral ciproPer neuro needs repeat MRI prior to d/c of antibiotic therapy.  Variably cooperative with PT 2. ESRD -On HD MWF- now on TTS. K 4.5.Safetywasa big issue.  Patiientwasdisruptive and pulled out her arterial needle.Family would like to continue dialysis, they have agreed to sit with patient 1:1 during inpatient HD on TTS schedule, 1st shift - We have confirmed she has a first shift spot available - needs to be at dialysis at 7:45 to start treatment at 805.  Improvement in behavior for last3-4HD as long as family was present.Should be safe to return to unit.Cont HD on schedule- HD here tomorrow if not d/c today 3. Anemia of CKD-Hgb9>10.6 > 9.7  Continue ESA. Aranesp 60 q Tuesday 4. Secondary hyperparathyroidism -Significant hypercalcemia at 10.6.Last iPTH 136 November - plan stop calcitriol for now- can resume at 0.25 - 0.5 when corrected Ca comes down. Using 2 Ca bath. Follow up with outpatient HD. On auryxiaas well 5. HTN/volume -BP/volume stable. Net UF Thrusday 1 L post wt 38 kg - kept even Saturday. 6.Severe malnutrition/FTT - Alb low. Diet liberalized w/fluid restriction. On Nepro/Prostat. Continues to lose weight. Essentially not eating much of anything 7. Dementia/Agitation/Delirium - Psych eval on 07/09/2018 stated she lacks capacity regarding medical decisions. On Zyprexa for agitation. Daughter is POC. Palliative care consulted but family would like to continue HD. 8. Hx CVA/AS/mod MS - EF ok 9. Dispo - DNR Talked abouttransition to  comfort care in setting of advanced dementia and behavior issues onHD.  Family meeting on 07/18/18 w/ palliative &Dr. Jonnie Finner, family would like to continue dialysis for now and commit to being present for all inpatient HD in addition to zyprexa. Dr. Joelyn Oms agreeable to take pt back.  [facility]. New outpatient HD time as above. Family member must sit with patient. Need to continue DNR status at discharge.   Myriam Jacobson, PA-C [place] 07/27/2018,9:47 AM  LOS: 24 days   Pt seen, examined and agree w A/P as above.  Kelly Splinter MD [facility] pager [phone]   07/27/2018, 11:56 AM    Subjective:   Doesn't want breakfast. Taking meds  Objective Vitals:   07/26/18 1647 07/26/18 2004 07/26/18 2126 07/27/18 0837  BP: (!) 113/53 (!) 130/57  (!) 121/54  Pulse: (!) 105 (!) 101  98  Resp: 16 16  14   Temp: 97.7 F (36.5 C) (!) 97.4 F (36.3 C)  97.8 F (36.6 C)  TempSrc: Oral Oral  Oral  SpO2: 100% 100%  100%  Weight:   35 kg   Height:       Physical Exam General: emaciated, alert, supine in bed. NAD Heart: RRR Lungs: no rales Abdomen:scaphoid nontender + BS Extremities: no LEedema Dialysis Access:  Left AVF + ruit   Additional Objective Labs: Basic Metabolic Panel: Recent Labs  Lab 07/21/18 0932 07/23/18 0825 07/25/18 0906  NA 137 138 137  K 4.5 4.8 5.2*  CL 102 100 102  CO2 28 29 26   GLUCOSE 126* 114* 84  BUN 23 16 22   CREATININE 5.55* 4.51*  4.31*  CALCIUM 9.6 11.0* 10.6*  PHOS 3.9 3.4 4.6   Liver Function Tests: Recent Labs  Lab 07/21/18 0932 07/23/18 0825 07/25/18 0906  ALBUMIN 1.8* 2.2* 2.2*   No results for input(s): LIPASE, AMYLASE in the last 168 hours. CBC: Recent Labs  Lab 07/21/18 0933 07/23/18 0825 07/25/18 0907  WBC 2.8* 4.6 6.8  HGB 9.0* 10.6* 9.7*  HCT 30.5* 35.5* 33.9*  MCV 99.7 99.2 100.6*  PLT 122* 153 164   Blood Culture    Component Value Date/Time    SDES ABSCESS T12 07/04/2018 1626   SPECREQUEST NONE 07/04/2018 1626   CULT  07/04/2018 1626    RARE PSEUDOMONAS AERUGINOSA NO ANAEROBES ISOLATED Performed at [facility], [place] [address], [place], [place] [address]    REPTSTATUS 07/09/2018 FINAL 07/04/2018 1626    Cardiac Enzymes: No results for input(s): CKTOTAL, CKMB, CKMBINDEX, TROPONINI in the last 168 hours. CBG: No results for input(s): GLUCAP in the last 168 hours. Iron Studies: No results for input(s): IRON, TIBC, TRANSFERRIN, FERRITIN in the last 72 hours. Lab Results  Component Value Date   INR 1.13 03/29/2018   INR 1.03 03/06/2018   INR 1.30 02/17/2017   Studies/Results: No results found. Medications:  . aspirin EC  81 mg Oral Daily  . Chlorhexidine Gluconate Cloth  6 each Topical Q0600  . Chlorhexidine Gluconate Cloth  6 each Topical Q0600  . ciprofloxacin  500 mg Oral Daily  . darbepoetin (ARANESP) injection - DIALYSIS  60 mcg Intravenous Q Tue-HD  . feeding supplement (NEPRO CARB STEADY)  237 mL Oral BID BM  . feeding supplement (PRO-STAT SUGAR FREE 64)  30 mL Oral BID  . ferric citrate  420 mg Oral TID WC  . heparin  5,000 Units Subcutaneous Q8H  . latanoprost  1 drop Both Eyes QHS  . midodrine  10 mg Oral Once per day on Tue Thu Sat  . multivitamin  1 tablet Oral QHS  . OLANZapine  2.5 mg Oral QHS  . polyethylene glycol  17 g Oral Daily  . senna-docusate  1 tablet Oral BID

## 2018-07-27 NOTE — Progress Notes (Signed)
Medicine attending: Clinical status and medical database reviewed with resident physician Dr. Vilma Prader and I concur with her evaluation and management plan. No acute change. Date 23 antibiotics for T12-L1 discitis/osteomyelitis with associated epidural abscess.  Some skipped doses at the beginning although probably adequate in view of end-stage renal disease. Awaiting final insurance approval for bed offer made from [facility]. She will continue dialysis as an outpatient.  Behavioral issues controlled with low-dose Zyprexa.

## 2018-07-28 ENCOUNTER — Encounter: Payer: Self-pay | Admitting: Internal Medicine

## 2018-07-28 LAB — RENAL FUNCTION PANEL
Albumin: 2.1 g/dL — ABNORMAL LOW (ref 3.5–5.0)
Anion gap: 11 (ref 5–15)
BUN: 25 mg/dL — ABNORMAL HIGH (ref 8–23)
CO2: 25 mmol/L (ref 22–32)
Calcium: 8.9 mg/dL (ref 8.9–10.3)
Chloride: 102 mmol/L (ref 98–111)
Creatinine, Ser: 5.36 mg/dL — ABNORMAL HIGH (ref 0.44–1.00)
GFR calc Af Amer: 8 mL/min — ABNORMAL LOW (ref >60)
GFR calc non Af Amer: 7 mL/min — ABNORMAL LOW (ref >60)
Glucose, Bld: 63 mg/dL — ABNORMAL LOW (ref 70–99)
Phosphorus: 4.5 mg/dL (ref 2.5–4.6)
Potassium: 4.1 mmol/L (ref 3.5–5.1)
Sodium: 138 mmol/L (ref 135–145)

## 2018-07-28 LAB — CBC
HCT: 30.4 % — ABNORMAL LOW (ref 36.0–46.0)
Hemoglobin: 9 g/dL — ABNORMAL LOW (ref 12.0–15.0)
MCH: 29.9 pg (ref 26.0–34.0)
MCHC: 29.6 g/dL — ABNORMAL LOW (ref 30.0–36.0)
MCV: 101 fL — ABNORMAL HIGH (ref 80.0–100.0)
Platelets: 127 10*3/uL — ABNORMAL LOW (ref 150–400)
RBC: 3.01 MIL/uL — ABNORMAL LOW (ref 3.87–5.11)
RDW: 17.6 % — ABNORMAL HIGH (ref 11.5–15.5)
WBC: 2.2 10*3/uL — ABNORMAL LOW (ref 4.0–10.5)
nRBC: 0 % (ref 0.0–0.2)

## 2018-07-28 MED ORDER — HEPARIN SODIUM (PORCINE) 1000 UNIT/ML DIALYSIS: 1000.0000 [IU] | INTRAMUSCULAR | Status: DC | PRN

## 2018-07-28 MED ORDER — SODIUM CHLORIDE 0.9 % IV SOLN: 100.0000 mL | INTRAVENOUS | Status: DC | PRN

## 2018-07-28 MED ORDER — ALTEPLASE 2 MG IJ SOLR: 2.0000 mg | Freq: Once | INTRAMUSCULAR | Status: DC | PRN

## 2018-07-28 MED ORDER — LIDOCAINE-PRILOCAINE 2.5-2.5 % EX CREA: 1.0000 "application " | TOPICAL_CREAM | CUTANEOUS | Status: DC | PRN

## 2018-07-28 MED ORDER — DARBEPOETIN ALFA 60 MCG/0.3ML IJ SOSY
PREFILLED_SYRINGE | INTRAMUSCULAR | Status: AC
  Filled 2018-07-28: qty 0.3

## 2018-07-28 MED ORDER — PENTAFLUOROPROP-TETRAFLUOROETH EX AERO: 1.0000 "application " | INHALATION_SPRAY | CUTANEOUS | Status: DC | PRN

## 2018-07-28 MED ORDER — LIDOCAINE HCL (PF) 1 % IJ SOLN: 5.0000 mL | INTRAMUSCULAR | Status: DC | PRN

## 2018-07-28 NOTE — Progress Notes (Signed)
  Date: 07/28/2018  Patient name: Nancy [name]  Medical record number: [id number]  Date of birth: 04-Jul-1941   I have seen and evaluated this patient and I have discussed the plan of care with the house staff. Please see their note for complete details. I concur with their findings with the following additions/corrections:   Seen together with the team on rounds this morning.  [name] was somewhat agitated during her interaction.  She is due for dialysis today.  She is medically stable for discharge to her facility when insurance approval is complete.  Appreciate nephrology assistance and getting her set up for outpatient dialysis.  They have confirmed that she has a chair available for first shift TTS and family will need to continue to sit with her to help manage her agitation.  [name], M.D., Ph.D. 07/28/2018, 2:28 PM

## 2018-07-28 NOTE — Progress Notes (Signed)
PT Cancellation Note  Patient Details Name: Nancy [name] MRN: [id number] DOB: 09-11-1940   Cancelled Treatment:    Reason Eval/Treat Not Completed: Patient declined, no reason specified attempted to work with patient, she repeatedly declined mobility with PT and began to become verbally agitated when PT only removed blankets and lowered bed rails in preparation for mobility, repeatedly refused to participate in therapy. She was left in bed with all needs met, bed alarm active.    Deniece Ree PT, DPT, CBIS  Supplemental Physical Therapist [facility]    Pager [phone] Acute Rehab Office [phone]

## 2018-07-28 NOTE — Progress Notes (Signed)
   Subjective: No overnight events. Patient has a breakfast tray by the bed and reports that she will eat soon. She tells the medical team she is not getting HD today because it is not Tuesday. She states it is Sunday the 21st.   Objective:  Vital signs in last 24 hours: Vitals:   07/26/18 2126 07/27/18 0837 07/27/18 1651 07/28/18 0429  BP:  (!) 121/54 (!) 84/47 (!) 100/53  Pulse:  98 96 97  Resp:  14 18 19   Temp:  97.8 F (36.6 C)  98.2 F (36.8 C)  TempSrc:  Oral  Oral  SpO2:  100% 99% 100%  Weight: 35 kg     Height:       Gen: laying comfortably in bed, no distress Neuro: LE strength intact Ext: no edema  Assessment/Plan:  Principal Problem:   Acute osteomyelitis of thoracic spine (HCC) Active Problems:   Palliative care encounter   ESRD (end stage renal disease) (HCC)   Epidural abscess   Psychomotor agitation   Discitis of thoracolumbar region   Pancytopenia ([place])  Ms. Infantino is a41 yoF with ESRD on HD MWF, CVA,and HTN presenting with right sided trunk/back pain after a fall, found to have osteomylitis/discitis of T12-L1 with ventral epidural abscess. She was subsequently admitted for further evaluation and management.  Osteomyelitis with ventral epidural abscess -Stable. Afebrile. - Initially on Cefepime with HD, but this dosing was sporadic and intermittent due to her inability to tolerate HD. She received 2g Cefepime on 12/28, 12/29, 12/30, 1/3, and 1/6. She was transitioned to [facility] on 1/9 for more regular dosing. Will require 6-8 weeks of antibiotic therapy. Plan -Cipro 500mg  daily - Repeat MRIafter6 weeksof antibiotic therapy(around2/14/2020) to guide further antimicrobial therapy -ContinuePO dilaudid PRN - PT/OTeval and treat  ESRD on HD (TTS) - Last dialysis 1/18 -Patient has tolerated herlast four sessionsof HD with family present.She is appropriate for outpatient HD and has been accepted by Dr. Joelyn Oms. - Management of chronic  anemia and hypercalcemia per nephrology Plan  -Nephrology and palliative care following, appreciate recs - Midodrine on dialysis days for hypotension around sessions - Social work consulted for help with placement - HD today  Dementia and Agitation -Dementia with behavioral disturbances, particularly during dialysishave improved.  -Found to not have capacity by psychiatry on 1/2. Appreciate recommendation. She continues to be disoriented. Plan -ScheduledZyprexa 2.5mg qhs  Dispo: Discharge pending insurance authorization for available bed at [facility].  , [name], MD 07/28/2018, 6:47 AM Pager: [phone]

## 2018-07-28 NOTE — Progress Notes (Signed)
Called patient's daughter,Susie if she can come this morning to be with patient in hemodialysis. Per daughter,Susie,she will come at 4p. Dain,HD RN made aware. Elek Holderness, Wonda Cheng, Therapist, sports

## 2018-07-28 NOTE — Progress Notes (Addendum)
[facility] Progress Note   Dialysis Orders: [facility] MWF 3.5 hrs 180NRe 400/Autoflow 1.5 45.5 kg 3.0 K/ 2.0 Ca  LUA AVF -No heparin -Mircera 225 mcg IV (Last dose 06/02/2018 Last HGB 10.2 06/22/18) -Calcitriol 0.25 mcg PO TIW (Last PTH 136 06/02/18 Last Phos 8.1 06/22/18)  Assessment/Plan: 1.S/p mechanical fall, osteomyelitis/discitis of T12-L1 w/ventral epidural abscess - On 6-8 week course cefepime -now on oral ciproPer neuro needs repeat MRI prior to d/c of antibiotic therapy.  Variably cooperative with PT 2. ESRD -On HD MWF- now on TTS. K 4.5.Safetywasa big issue.  Patiientwasdisruptive and pulled out her arterial needle.Family would like to continue dialysis, they have agreed to sit with patient 1:1 during inpatient HD on TTS schedule, 1st shift - We have confirmed she has a first shift spot available - needs to be at dialysis at 7:45 to start treatment at 805.  Improvement in behavior for last3-4HD as long as family was present.Should be safe to return to unit.Cont HD on schedule- HD today 3. Anemia of CKD-Hgb9>10.6 > 9.7  Continue ESA. Aranesp 60 q Tuesday 4. Secondary hyperparathyroidism -Significant hypercalcemia at 10.6.Last iPTH 136 November - plan stop calcitriol for now- can resume at 0.25 - 0.5 when corrected Ca comes down. Using 2 Ca bath. Follow up with outpatient HD. On auryxiaas well 5. HTN/volume -BP/volume stable. Net UF Thrusday 1 L post wt 38 kg - kept even Saturday. 6.Severe malnutrition/FTT - Alb low. Diet liberalized w/fluid restriction. On Nepro/Prostat. Continues to lose weight. Essentially not eating much of anything 7. Dementia/Agitation/Delirium - Psych eval on 07/09/2018 stated she lacks capacity regarding medical decisions. On Zyprexa for agitation. Daughter is POC. Palliative care consulted but family would like to continue HD.  Very confused this am. 8. Hx CVA/AS/mod MS - EF ok 9. Dispo - DNR Talked abouttransition to  comfort care in setting of advanced dementia and behavior issues onHD.  Family meeting on 07/18/18 w/ palliative &Dr. Jonnie Finner, family would like to continue dialysis for now and commit to being present for all inpatient HD in addition to zyprexa. Dr. Joelyn Oms agreeable to take pt back.  [facility]. New outpatient HD time as above. Family member must sit with patient. Need to continue DNR status at discharge. Still waiting on insurance clearance   Myriam Jacobson, PA-C [place] 07/28/2018,10:24 AM  LOS: 25 days   Pt seen, examined and agree w A/P as above.  Kelly Splinter MD [employer] pager [phone]   07/28/2018, 2:44 PM     Subjective:  Talking about the bed being rolled on wheels and also talking to that man in the room over their. Keeps buzzing for the nurse.  Objective Vitals:   07/26/18 2126 07/27/18 0837 07/27/18 1651 07/28/18 0429  BP:  (!) 121/54 (!) 84/47 (!) 100/53  Pulse:  98 96 97  Resp:  14 18 19   Temp:  97.8 F (36.6 C)  98.2 F (36.8 C)  TempSrc:  Oral  Oral  SpO2:  100% 99% 100%  Weight: 35 kg     Height:       Physical Exam General: emaciated, alert, supine in bed. NAD confused talking about things that are not real or present. Heart: RRR Lungs: no rales Abdomen:scaphoid nontender + BS Extremities: no LEedema Dialysis Access:  Left AVF + bruit   Additional Objective Labs: Basic Metabolic Panel: Recent Labs  Lab 07/23/18 0825 07/25/18 0906  NA 138 137  K 4.8 5.2*  CL 100 102  CO2 29 26  GLUCOSE 114* 84  BUN 16 22  CREATININE 4.51* 4.31*  CALCIUM 11.0* 10.6*  PHOS 3.4 4.6   Liver Function Tests: Recent Labs  Lab 07/23/18 0825 07/25/18 0906  ALBUMIN 2.2* 2.2*   No results for input(s): LIPASE, AMYLASE in the last 168 hours. CBC: Recent Labs  Lab 07/23/18 0825 07/25/18 0907  WBC 4.6 6.8  HGB 10.6* 9.7*  HCT 35.5* 33.9*  MCV 99.2 100.6*  PLT 153 164   Blood  Culture    Component Value Date/Time   SDES ABSCESS T12 07/04/2018 1626   SPECREQUEST NONE 07/04/2018 1626   CULT  07/04/2018 1626    RARE PSEUDOMONAS AERUGINOSA NO ANAEROBES ISOLATED Performed at [facility], [place] [address] [place], [place] [address]    REPTSTATUS 07/09/2018 FINAL 07/04/2018 1626    Cardiac Enzymes: No results for input(s): CKTOTAL, CKMB, CKMBINDEX, TROPONINI in the last 168 hours. CBG: No results for input(s): GLUCAP in the last 168 hours. Iron Studies: No results for input(s): IRON, TIBC, TRANSFERRIN, FERRITIN in the last 72 hours. Lab Results  Component Value Date   INR 1.13 03/29/2018   INR 1.03 03/06/2018   INR 1.30 02/17/2017   Studies/Results: No results found. Medications:  . aspirin EC  81 mg Oral Daily  . Chlorhexidine Gluconate Cloth  6 each Topical Q0600  . ciprofloxacin  500 mg Oral Daily  . darbepoetin (ARANESP) injection - DIALYSIS  60 mcg Intravenous Q Tue-HD  . feeding supplement (NEPRO CARB STEADY)  237 mL Oral BID BM  . feeding supplement (PRO-STAT SUGAR FREE 64)  30 mL Oral BID  . ferric citrate  420 mg Oral TID WC  . heparin  5,000 Units Subcutaneous Q8H  . latanoprost  1 drop Both Eyes QHS  . midodrine  10 mg Oral Once per day on Tue Thu Sat  . multivitamin  1 tablet Oral QHS  . OLANZapine  2.5 mg Oral QHS  . polyethylene glycol  17 g Oral Daily  . senna-docusate  1 tablet Oral BID

## 2018-07-29 DIAGNOSIS — R109 Unspecified abdominal pain: Secondary | ICD-10-CM | POA: Diagnosis not present

## 2018-07-29 DIAGNOSIS — R488 Other symbolic dysfunctions: Secondary | ICD-10-CM | POA: Diagnosis not present

## 2018-07-29 DIAGNOSIS — N186 End stage renal disease: Secondary | ICD-10-CM | POA: Diagnosis not present

## 2018-07-29 DIAGNOSIS — G062 Extradural and subdural abscess, unspecified: Secondary | ICD-10-CM | POA: Diagnosis not present

## 2018-07-29 DIAGNOSIS — I959 Hypotension, unspecified: Secondary | ICD-10-CM | POA: Diagnosis not present

## 2018-07-29 DIAGNOSIS — I120 Hypertensive chronic kidney disease with stage 5 chronic kidney disease or end stage renal disease: Secondary | ICD-10-CM | POA: Diagnosis not present

## 2018-07-29 DIAGNOSIS — R1084 Generalized abdominal pain: Secondary | ICD-10-CM | POA: Diagnosis not present

## 2018-07-29 DIAGNOSIS — R2689 Other abnormalities of gait and mobility: Secondary | ICD-10-CM | POA: Diagnosis not present

## 2018-07-29 DIAGNOSIS — Z743 Need for continuous supervision: Secondary | ICD-10-CM | POA: Diagnosis not present

## 2018-07-29 DIAGNOSIS — Z91048 Other nonmedicinal substance allergy status: Secondary | ICD-10-CM

## 2018-07-29 DIAGNOSIS — R52 Pain, unspecified: Secondary | ICD-10-CM | POA: Diagnosis not present

## 2018-07-29 DIAGNOSIS — K5900 Constipation, unspecified: Secondary | ICD-10-CM | POA: Diagnosis not present

## 2018-07-29 DIAGNOSIS — R110 Nausea: Secondary | ICD-10-CM | POA: Diagnosis not present

## 2018-07-29 DIAGNOSIS — M4625 Osteomyelitis of vertebra, thoracolumbar region: Secondary | ICD-10-CM | POA: Diagnosis not present

## 2018-07-29 DIAGNOSIS — M6281 Muscle weakness (generalized): Secondary | ICD-10-CM | POA: Diagnosis not present

## 2018-07-29 DIAGNOSIS — D631 Anemia in chronic kidney disease: Secondary | ICD-10-CM | POA: Diagnosis not present

## 2018-07-29 DIAGNOSIS — M4626 Osteomyelitis of vertebra, lumbar region: Secondary | ICD-10-CM | POA: Diagnosis not present

## 2018-07-29 DIAGNOSIS — I639 Cerebral infarction, unspecified: Secondary | ICD-10-CM | POA: Diagnosis not present

## 2018-07-29 DIAGNOSIS — Z5321 Procedure and treatment not carried out due to patient leaving prior to being seen by health care provider: Secondary | ICD-10-CM | POA: Diagnosis not present

## 2018-07-29 DIAGNOSIS — R531 Weakness: Secondary | ICD-10-CM | POA: Diagnosis not present

## 2018-07-29 DIAGNOSIS — G061 Intraspinal abscess and granuloma: Secondary | ICD-10-CM | POA: Diagnosis not present

## 2018-07-29 DIAGNOSIS — N2581 Secondary hyperparathyroidism of renal origin: Secondary | ICD-10-CM | POA: Diagnosis not present

## 2018-07-29 DIAGNOSIS — Z992 Dependence on renal dialysis: Secondary | ICD-10-CM | POA: Diagnosis not present

## 2018-07-29 DIAGNOSIS — M869 Osteomyelitis, unspecified: Secondary | ICD-10-CM | POA: Diagnosis not present

## 2018-07-29 DIAGNOSIS — I150 Renovascular hypertension: Secondary | ICD-10-CM | POA: Diagnosis not present

## 2018-07-29 DIAGNOSIS — R51 Headache: Secondary | ICD-10-CM | POA: Diagnosis not present

## 2018-07-29 DIAGNOSIS — B965 Pseudomonas (aeruginosa) (mallei) (pseudomallei) as the cause of diseases classified elsewhere: Secondary | ICD-10-CM | POA: Diagnosis not present

## 2018-07-29 DIAGNOSIS — M4645 Discitis, unspecified, thoracolumbar region: Secondary | ICD-10-CM | POA: Diagnosis not present

## 2018-07-29 DIAGNOSIS — E569 Vitamin deficiency, unspecified: Secondary | ICD-10-CM | POA: Diagnosis not present

## 2018-07-29 DIAGNOSIS — R1312 Dysphagia, oropharyngeal phase: Secondary | ICD-10-CM | POA: Diagnosis not present

## 2018-07-29 DIAGNOSIS — R279 Unspecified lack of coordination: Secondary | ICD-10-CM | POA: Diagnosis not present

## 2018-07-29 DIAGNOSIS — H409 Unspecified glaucoma: Secondary | ICD-10-CM | POA: Diagnosis not present

## 2018-07-29 DIAGNOSIS — Z885 Allergy status to narcotic agent status: Secondary | ICD-10-CM

## 2018-07-29 LAB — HEPATITIS B SURFACE ANTIGEN: Hepatitis B Surface Ag: NEGATIVE

## 2018-07-29 MED ORDER — CIPROFLOXACIN HCL 500 MG PO TABS: 500.0000 mg | ORAL_TABLET | Freq: Every day | ORAL | 0 refills | Status: DC

## 2018-07-29 MED ORDER — MIDODRINE HCL 10 MG PO TABS: 10.0000 mg | ORAL_TABLET | Freq: Every day | ORAL | 0 refills | Status: AC

## 2018-07-29 MED ORDER — OLANZAPINE 2.5 MG PO TABS: 2.5000 mg | ORAL_TABLET | Freq: Every day | ORAL | 0 refills | Status: AC

## 2018-07-29 MED ORDER — RENA-VITE PO TABS: 1.0000 | ORAL_TABLET | Freq: Every day | ORAL | 0 refills | Status: AC

## 2018-07-29 MED ORDER — CHLORHEXIDINE GLUCONATE CLOTH 2 % EX PADS
6.0000 | MEDICATED_PAD | Freq: Every day | CUTANEOUS | Status: DC
  Administered 2018-07-29: 6 via TOPICAL

## 2018-07-29 MED ORDER — FERRIC CITRATE 1 GM 210 MG(FE) PO TABS
210.0000 mg | ORAL_TABLET | Freq: Three times a day (TID) | ORAL | Status: DC
  Filled 2018-07-29 (×2): qty 1

## 2018-07-29 NOTE — Clinical Social Work Placement (Signed)
   CLINICAL SOCIAL WORK PLACEMENT  NOTE *DISCHARGED TO [facility]IA [facility]BULANCE  Date:  07/29/2018  Patient Details  Name: Nancy [name] MRN: [id number] Date of Birth: 30-Mar-1941  Clinical Social Work is seeking post-discharge placement for this patient at the [place] level of care (*CSW will initial, date and re-position this form in  chart as items are completed):  Yes(Emailed to granddaughter [name] at: [email or web address] )   Patient/family provided with [place] Work Department's list of facilities offering this level of care within the geographic area requested by the patient (or if unable, by the patient's family).  Yes   Patient/family informed of their freedom to choose among providers that offer the needed level of care, that participate in Medicare, Medicaid or managed care program needed by the patient, have an available bed and are willing to accept the patient.  Yes   Patient/family informed of [place]'s ownership interest in [facility] and [facility], as well as of the fact that they are under no obligation to receive care at these facilities.  PASRR submitted to EDS on 07/07/18     PASRR number received on 07/07/18     Existing PASRR number confirmed on       FL2 transmitted to all facilities in geographic area requested by pt/family on 07/09/18     FL2 transmitted to all facilities within larger geographic area on       Patient informed that his/her managed care company has contracts with or will negotiate with certain facilities, including the following:        Yes   Patient/family informed of bed offers received.  Patient chooses bed at [facility]     Physician recommends and patient chooses bed at      Patient to be transferred to [facility] on 07/29/18.  Patient to be transferred to facility by Ambulance     Patient family notified on 07/29/18 of transfer.  Name of family  member notified:  Granddaughter [name] - [phone]     PHYSICIAN       Additional Comment:    _______________________________________________ Sable Feil, LCSW 07/29/2018, 1:42 PM

## 2018-07-29 NOTE — Progress Notes (Signed)
OT Cancellation Note  Patient Details Name: Nancy [name] MRN: [id number] DOB: 1941-02-02   Cancelled Treatment:    Reason Eval/Treat Not Completed: Patient declined, no reason specified. Pt refused EOB and OOB activity stating 'I'll do it after I get washed up" OT educated pt on role of OT in assisting with ADLs and pt became agitated and continued to refuse and stated, "I'm not ready to get washed up, get out of here!"  [name] 07/29/2018, 8:58 AM

## 2018-07-29 NOTE — Progress Notes (Signed)
Patient went so [facility] via [facility]. No IVs to be taken out. AVS printed out and given to patient. Patient belongings sent with patient's family. Report given to RN at facility.   Farley Ly RN

## 2018-07-29 NOTE — Progress Notes (Signed)
   Subjective: No overnight events. Patient is sleeping comfortably in bed this morning and has no acute complaints.   Objective:  Vital signs in last 24 hours: Vitals:   07/28/18 1630 07/28/18 1719 07/28/18 2058 07/29/18 0449  BP: (!) 98/58 (!) 99/50 (!) 98/47 (!) 105/49  Pulse: 61 76 89 91  Resp: 16 18 18 18   Temp: 97.9 F (36.6 C)  98 F (36.7 C) 97.7 F (36.5 C)  TempSrc: Oral  Oral   SpO2: 96% (!) 86% 100% 100%  Weight:   40 kg   Height:       Gen: comfortably sleeping, nad Neuro: LE strength 5/5 CV: diastolic and systolic murmur  Assessment/Plan:  Principal Problem:   Acute osteomyelitis of thoracic spine (HCC) Active Problems:   Palliative care encounter   ESRD (end stage renal disease) ([place])   Epidural abscess   Psychomotor agitation   Discitis of thoracolumbar region   Pancytopenia (HCC)  Nancy [name] is a35 yoF with ESRD on HD MWF, CVA,and HTN presenting with right sided trunk/back pain after a fall, found to have osteomylitis/discitis of T12-L1 with ventral epidural abscess. She was subsequently admitted for further evaluation and management.  Osteomyelitis with ventral epidural abscess -Stable. Afebrile. - Initially on Cefepime with HD, but this dosing was sporadic and intermittent due to her inability to tolerate HD. She received 2g Cefepime on 12/28, 12/29, 12/30, 1/3, and 1/6. She was transitioned to [facility] on 1/9 for more regular dosing. Will require 6-8 weeks ofantibiotictherapy. Plan -Cipro 500mg  daily - Repeat MRIafter6 weeksof antibiotic therapy(around2/14/2020) to guide further antimicrobial therapy -ContinuePO dilaudid PRN - PT/OTeval and treat  ESRD on HD (TTS) - Last dialysis 1/21 -Patient has tolerated herlast five sessionsof HD with family present.She is appropriate for outpatient HDand has been scheduled for the first TTS shift at an outpatient facility. Family is to be present at all sessions. - Management of chronic  anemia and hypercalcemia per nephrology Plan  -Nephrology and palliative care following, appreciate recs - Midodrine on dialysis days for hypotension around sessions  Dementia and Agitation -Dementia with behavioral disturbances, particularly during dialysishave improved.  -Found to not have capacity by psychiatry on 1/2. Appreciate recommendation.  Plan -ScheduledZyprexa 2.5mg qhs  Dispo: Discharge pending insurance authorization for available bed at [facility].  Sedona Wenk, [name], MD 07/29/2018, 6:32 AM Pager: [phone]

## 2018-07-29 NOTE — Progress Notes (Addendum)
[facility] Progress Note   Dialysis Orders: [place] MWF 3.5 hrs 180NRe 400/Autoflow 1.5 45.5 kg 3.0 K/ 2.0 Ca  LUA AVF -No heparin -Mircera 225 mcg IV (Last dose 06/02/2018 Last HGB 10.2 06/22/18) -Calcitriol 0.25 mcg PO TIW (Last PTH 136 06/02/18 Last Phos 8.1 06/22/18)  Assessment/Plan: 1.S/p mechanical fall, osteomyelitis/discitis of T12-L1 w/ventral epidural abscess - On 6-8 week course cefepime -now on oral ciproPer neuro needs repeat MRI prior to d/c of antibiotic therapy.  Variably cooperative with PT 2. ESRD -On HD MWF- now on TTS. K 4.5.Safetywasa big issue.  Patiientwasdisruptive and pulled out her arterial needle.Family would like to continue dialysis, they have agreed to sit with patient 1:1 during inpatient HD on TTS schedule, 1st shift - We have confirmed she has a first shift spot available - needs to be at dialysis at 7:45 to start treatment at 805.  Had 1:1 HD yesterday due to absence of family member. Patient calm throughout until time to pull needles. Eventually cooperated.  It is critical that a family be with her at all times during her outpatient dialysis treatments starting tomorrow.  SW to confirm today. HD orders written if for some reason not d/c today 3. Anemia of CKD-Hgb9>10.6 > 9.7>9  Continue ESA. Aranesp 60 q Tuesday- will increase next dose if hgb trends below 9- follow up at outpt HD 4. Secondary hyperparathyroidism -Significant hypercalcemia at 10.6.Last iPTH 136 November - plan stop calcitriol for now- can resume at 0.25 - 0.5 when corrected Ca comes down. Using 2 Ca bath. Ca trending down uncorrected 8.9 1/21 Will stop aurixya P in 2s- reduce to 1ac from 2 ac  5. HTN/volume no meds-BP/volume stable. Net UF Thrusday 1 L post wt 38 kg - kept even Saturday.and + 214 today - wt ~38 6.Severe malnutrition/FTT - Alb low. Diet liberalized w/fluid restriction. On Nepro/Prostat. Continues to lose weight. Essentially not eating much of  anything 7. Dementia/Agitation/Delirium - Psych eval on 07/09/2018 stated she lacks capacity regarding medical decisions. On Zyprexa for agitation. Daughter is POA.  8. Hx CVA/AS/mod MS - EF ok 9. Dispo - DNR Talked abouttransition to comfort care in setting of advanced dementia and behavior issues onHD.  Family meeting on 07/18/18 w/ palliative &Dr. Jonnie Finner, family would like to continue dialysis for now and commit to being present for all inpatient HD in addition to zyprexa. Dr. Joelyn Oms agreeable to take pt back.  [facility]. New outpatient HD time as above. Family member must sit with patient. Need to continue DNR status at discharge. Discussed with SW this am - should be good for d/c.   Myriam Jacobson, PA-C [place] 07/29/2018,10:09 AM  LOS: 26 days   Pt seen, examined and agree w A/P as above.  Kelly Splinter MD [employer] pager [phone]   07/29/2018, 12:13 PM    Subjective:  Wants to know when she is going to have a bath. Ate small amount on tray  Objective Vitals:   07/28/18 1719 07/28/18 2058 07/29/18 0449 07/29/18 0905  BP: (!) 99/50 (!) 98/47 (!) 105/49 (!) 95/52  Pulse: 76 89 91 91  Resp: 18 18 18 18   Temp:  98 F (36.7 C) 97.7 F (36.5 C) (!) 97.4 F (36.3 C)  TempSrc:  Oral  Oral  SpO2: (!) 86% 100% 100% 100%  Weight:  40 kg    Height:       Physical Exam General: emaciated, alert, talkative  Heart:  RRR Lungs: no rales Abdomen:scaphoid nontender + BS Extremities: no LEedema Dialysis Access:  Left AVF + bruit, skin thickened dark over AVF   Additional Objective Labs: Basic Metabolic Panel: Recent Labs  Lab 07/23/18 0825 07/25/18 0906 07/28/18 1417  NA 138 137 138  K 4.8 5.2* 4.1  CL 100 102 102  CO2 29 26 25   GLUCOSE 114* 84 63*  BUN 16 22 25*  CREATININE 4.51* 4.31* 5.36*  CALCIUM 11.0* 10.6* 8.9  PHOS 3.4 4.6 4.5   Liver Function Tests: Recent Labs  Lab  07/23/18 0825 07/25/18 0906 07/28/18 1417  ALBUMIN 2.2* 2.2* 2.1*   No results for input(s): LIPASE, AMYLASE in the last 168 hours. CBC: Recent Labs  Lab 07/23/18 0825 07/25/18 0907 07/28/18 1417  WBC 4.6 6.8 2.2*  HGB 10.6* 9.7* 9.0*  HCT 35.5* 33.9* 30.4*  MCV 99.2 100.6* 101.0*  PLT 153 164 127*   Blood Culture    Component Value Date/Time   SDES ABSCESS T12 07/04/2018 1626   SPECREQUEST NONE 07/04/2018 1626   CULT  07/04/2018 1626    RARE PSEUDOMONAS AERUGINOSA NO ANAEROBES ISOLATED Performed at [facility], [place] [address] [place], [place] [address]    REPTSTATUS 07/09/2018 FINAL 07/04/2018 1626    Cardiac Enzymes: No results for input(s): CKTOTAL, CKMB, CKMBINDEX, TROPONINI in the last 168 hours. CBG: No results for input(s): GLUCAP in the last 168 hours. Iron Studies: No results for input(s): IRON, TIBC, TRANSFERRIN, FERRITIN in the last 72 hours. Lab Results  Component Value Date   INR 1.13 03/29/2018   INR 1.03 03/06/2018   INR 1.30 02/17/2017   Studies/Results: No results found. Medications:  . aspirin EC  81 mg Oral Daily  . Chlorhexidine Gluconate Cloth  6 each Topical Q0600  . ciprofloxacin  500 mg Oral Daily  . darbepoetin (ARANESP) injection - DIALYSIS  60 mcg Intravenous Q Tue-HD  . feeding supplement (NEPRO CARB STEADY)  237 mL Oral BID BM  . feeding supplement (PRO-STAT SUGAR FREE 64)  30 mL Oral BID  . ferric citrate  420 mg Oral TID WC  . heparin  5,000 Units Subcutaneous Q8H  . latanoprost  1 drop Both Eyes QHS  . midodrine  10 mg Oral Once per day on Tue Thu Sat  . multivitamin  1 tablet Oral QHS  . OLANZapine  2.5 mg Oral QHS  . polyethylene glycol  17 g Oral Daily  . senna-docusate  1 tablet Oral BID

## 2018-07-30 ENCOUNTER — Other Ambulatory Visit: Payer: Self-pay | Admitting: *Deleted

## 2018-07-30 DIAGNOSIS — R51 Headache: Secondary | ICD-10-CM | POA: Diagnosis not present

## 2018-07-30 DIAGNOSIS — D631 Anemia in chronic kidney disease: Secondary | ICD-10-CM | POA: Diagnosis not present

## 2018-07-30 DIAGNOSIS — N2581 Secondary hyperparathyroidism of renal origin: Secondary | ICD-10-CM | POA: Diagnosis not present

## 2018-07-30 DIAGNOSIS — N186 End stage renal disease: Secondary | ICD-10-CM | POA: Diagnosis not present

## 2018-07-30 NOTE — Patient Outreach (Signed)
[place] [facility]) Care Management  07/30/2018  Nancy [name] 1940-12-18 [id number]   Notified by [facility] clinical AD that member discharged yesterday to [facility] for rehab.  Referral placed for CSW to follow while at SNF, request made to have new referral for complex case management pending disposition.  [name], [place], MSN [place] [phone]

## 2018-07-31 DIAGNOSIS — M869 Osteomyelitis, unspecified: Secondary | ICD-10-CM | POA: Diagnosis not present

## 2018-07-31 DIAGNOSIS — N186 End stage renal disease: Secondary | ICD-10-CM | POA: Diagnosis not present

## 2018-07-31 DIAGNOSIS — G062 Extradural and subdural abscess, unspecified: Secondary | ICD-10-CM | POA: Diagnosis not present

## 2018-08-01 DIAGNOSIS — R51 Headache: Secondary | ICD-10-CM | POA: Diagnosis not present

## 2018-08-01 DIAGNOSIS — D631 Anemia in chronic kidney disease: Secondary | ICD-10-CM | POA: Diagnosis not present

## 2018-08-01 DIAGNOSIS — N2581 Secondary hyperparathyroidism of renal origin: Secondary | ICD-10-CM | POA: Diagnosis not present

## 2018-08-01 DIAGNOSIS — N186 End stage renal disease: Secondary | ICD-10-CM | POA: Diagnosis not present

## 2018-08-04 ENCOUNTER — Ambulatory Visit: Payer: Self-pay | Admitting: *Deleted

## 2018-08-04 ENCOUNTER — Emergency Department (HOSPITAL_COMMUNITY)
Admission: EM | Admit: 2018-08-04 | Discharge: 2018-08-04 | Disposition: A | Discharge status: Home / Self Care | Payer: Medicare Other | Attending: Emergency Medicine | Admitting: Emergency Medicine

## 2018-08-04 ENCOUNTER — Other Ambulatory Visit: Payer: Self-pay | Admitting: *Deleted

## 2018-08-04 DIAGNOSIS — R109 Unspecified abdominal pain: Secondary | ICD-10-CM | POA: Insufficient documentation

## 2018-08-04 DIAGNOSIS — Z5321 Procedure and treatment not carried out due to patient leaving prior to being seen by health care provider: Secondary | ICD-10-CM | POA: Diagnosis not present

## 2018-08-04 DIAGNOSIS — N2581 Secondary hyperparathyroidism of renal origin: Secondary | ICD-10-CM | POA: Diagnosis not present

## 2018-08-04 DIAGNOSIS — R51 Headache: Secondary | ICD-10-CM | POA: Insufficient documentation

## 2018-08-04 DIAGNOSIS — N186 End stage renal disease: Secondary | ICD-10-CM | POA: Diagnosis not present

## 2018-08-04 DIAGNOSIS — D631 Anemia in chronic kidney disease: Secondary | ICD-10-CM | POA: Diagnosis not present

## 2018-08-04 LAB — COMPREHENSIVE METABOLIC PANEL
ALK PHOS: 52 U/L (ref 38–126)
ALT: 15 U/L (ref 0–44)
ANION GAP: 15 (ref 5–15)
AST: 37 U/L (ref 15–41)
Albumin: 2.1 g/dL — ABNORMAL LOW (ref 3.5–5.0)
BUN: 11 mg/dL (ref 8–23)
CALCIUM: 8.3 mg/dL — AB (ref 8.9–10.3)
CO2: 25 mmol/L (ref 22–32)
Chloride: 95 mmol/L — ABNORMAL LOW (ref 98–111)
Creatinine, Ser: 3.26 mg/dL — ABNORMAL HIGH (ref 0.44–1.00)
GFR calc Af Amer: 15 mL/min — ABNORMAL LOW (ref >60)
GFR calc non Af Amer: 13 mL/min — ABNORMAL LOW (ref >60)
Glucose, Bld: 69 mg/dL — ABNORMAL LOW (ref 70–99)
Potassium: 4 mmol/L (ref 3.5–5.1)
Sodium: 135 mmol/L (ref 135–145)
Total Bilirubin: 0.7 mg/dL (ref 0.3–1.2)
Total Protein: 5.1 g/dL — ABNORMAL LOW (ref 6.5–8.1)

## 2018-08-04 LAB — CBC
HCT: 38.8 % (ref 36.0–46.0)
Hemoglobin: 11.1 g/dL — ABNORMAL LOW (ref 12.0–15.0)
MCH: 28.5 pg (ref 26.0–34.0)
MCHC: 28.6 g/dL — ABNORMAL LOW (ref 30.0–36.0)
MCV: 99.5 fL (ref 80.0–100.0)
PLATELETS: 126 10*3/uL — AB (ref 150–400)
RBC: 3.9 MIL/uL (ref 3.87–5.11)
RDW: 16.6 % — ABNORMAL HIGH (ref 11.5–15.5)
WBC: 4.3 10*3/uL (ref 4.0–10.5)
nRBC: 0 % (ref 0.0–0.2)

## 2018-08-04 LAB — FUNGUS CULTURE WITH STAIN

## 2018-08-04 LAB — LIPASE, BLOOD: Lipase: 22 U/L (ref 11–51)

## 2018-08-04 LAB — FUNGUS CULTURE RESULT

## 2018-08-04 LAB — FUNGAL ORGANISM REFLEX

## 2018-08-04 MED ORDER — SODIUM CHLORIDE 0.9% FLUSH: 3.0000 mL | Freq: Once | INTRAVENOUS | Status: DC

## 2018-08-04 NOTE — ED Notes (Signed)
No answer in waiting room 

## 2018-08-04 NOTE — ED Notes (Signed)
Pt did not reply for VS recheck. Was NOT in [employer].

## 2018-08-04 NOTE — ED Triage Notes (Signed)
Per [facility]: Patient to ED from [facility] c/o headache and abdominal pain with nausea onset of both yesterday. She also reports diarrhea yesterday and this morning. Patient only received 2 hours of dialysis today. EMS VS: 82/54 (per EMS, normal for patient), P 66, RR 16. Patient reports history of headaches.

## 2018-08-04 NOTE — Patient Outreach (Signed)
[facility]) Care Management  08/04/2018  Nancy [name] 19-Nov-1940 [id number]   Post Acute Care Coordination Call   Phone call to the discharge planner at [facility] to discuss patient's progress in rehab and tentative discharge plan.  There was no answer, voicemail message left for a return call.   Plan: This social worker will plan to visit patient at [facility] on 08/06/18.   Sheralyn Boatman [facility] Care Management [phone]

## 2018-08-05 ENCOUNTER — Telehealth: Payer: Self-pay

## 2018-08-05 DIAGNOSIS — R51 Headache: Secondary | ICD-10-CM | POA: Diagnosis not present

## 2018-08-05 DIAGNOSIS — Z992 Dependence on renal dialysis: Secondary | ICD-10-CM | POA: Diagnosis not present

## 2018-08-05 DIAGNOSIS — N186 End stage renal disease: Secondary | ICD-10-CM | POA: Diagnosis not present

## 2018-08-05 DIAGNOSIS — R110 Nausea: Secondary | ICD-10-CM | POA: Diagnosis not present

## 2018-08-05 NOTE — Telephone Encounter (Signed)
Chrystal, lcsw THN calls back and she is updated on daughter's call. She is going to meet with pt and speak w/ daughter on the way to transfer to another SNF and that dialysis does not come to pt, pt must go to dialysis. But she will assess the situation and assist daughter and pt as she can

## 2018-08-05 NOTE — Telephone Encounter (Signed)
Thank you :)

## 2018-08-05 NOTE — Telephone Encounter (Signed)
Pt's granddaughter needs to speak with a nurse. Please call back.

## 2018-08-05 NOTE — Telephone Encounter (Signed)
Thank you. Is there anything for me to do to assist them?

## 2018-08-05 NOTE — Telephone Encounter (Signed)
Pt's daughter, susie calls and states pt is in [facility] at present but cannot walk and is getting worse everyday. She states the staff expects pt to get up and walk to transport area for trip to dialysis and pt cannot do this, states she is too weak. Wants paperwork done for transfer to a SNF where pt will receive more care and she states dialysis will brought to her. I have tried to call [facility] [place] lcsw, left message and hope she calls back. Will also send to laurend. And dr Dareen Piano

## 2018-08-05 NOTE — Telephone Encounter (Signed)
Not until lcsw speaks w/ pt and daughter, there might be something then, we will wait for update

## 2018-08-06 ENCOUNTER — Other Ambulatory Visit: Payer: Self-pay | Admitting: *Deleted

## 2018-08-06 ENCOUNTER — Encounter: Payer: Self-pay | Admitting: *Deleted

## 2018-08-06 DIAGNOSIS — D631 Anemia in chronic kidney disease: Secondary | ICD-10-CM | POA: Diagnosis not present

## 2018-08-06 DIAGNOSIS — R51 Headache: Secondary | ICD-10-CM | POA: Diagnosis not present

## 2018-08-06 DIAGNOSIS — N186 End stage renal disease: Secondary | ICD-10-CM | POA: Diagnosis not present

## 2018-08-06 DIAGNOSIS — N2581 Secondary hyperparathyroidism of renal origin: Secondary | ICD-10-CM | POA: Diagnosis not present

## 2018-08-06 NOTE — Patient Outreach (Signed)
[facility]) Care Management  08/06/2018  [place] B Towles 10-24-1940 [id number]   Post Acute Care Coordination Visit   This social worker completed Post Acute Care Coordination visit at [facility] Patient was out of the facility at the time of the visit, however this social worker did speak to the discharge [place] and [place]. This social worker's discussed patient's daughter's concern regarding patient's care and her issues with patient going out for dialysis. Per [place], she has been working with patient's granddaughter [name] on a long term care Medicaid application. Due to patient's progression, patient's granddaughter feels that long term care is the best option at this time.  This Education officer, museum was provided with the names of the Nursing Supervisor, Bess Kinds and the Surveyor, quantity of Nursing Leann Adon to give as a contact to patient's daughter [name] to discuss her concerns.  There is no discharge date set at this time.    Sheralyn Boatman [facility] Care Management [phone]

## 2018-08-06 NOTE — Patient Outreach (Signed)
[place] [facility]) Care Management  08/06/2018  [place] B Corsello 20-Oct-1940 [id number]   Post Acute Care Coordination   Phone call to patient's granddaughter [name] and daughter to discuss patient's progress in rehab and long term plan.  This Education officer, museum spoke with patient's daughter and provided her with the contact information for the Nursing Supervisor at [facility] and the Surveyor, quantity of Nursing.   Plan: Patient was at Dialysis when this social worker arrived today. This Education officer, museum to follow back up with patient within 1 week to for an update on her progress in rehab and tentative discharge plan.    Sheralyn Boatman [facility] Care Management [phone]

## 2018-08-06 NOTE — Patient Outreach (Signed)
[place]) Care Management  08/06/2018  [place] B Navarra 04-18-1941 [id number]   Phone call received yesterday from Freddy Finner, RN stating that she received a phone call form patient's daughter with a desire to transfer patient from [facility] to another facility due to concerns regarding care received. Per [name], patient's daughter is  concerned that patient is having to get up to go to dialysis. Patient's daughter feels she is too weak for this and would like other options for dialysis.  Daughters phone number provided by [facility] [phone].  Plan: This social worker to visit patient in the facility on 08/06/18 and will follow up with patient's daughter.    Sheralyn Boatman [facility] Care Management [phone]

## 2018-08-07 DIAGNOSIS — Z992 Dependence on renal dialysis: Secondary | ICD-10-CM | POA: Diagnosis not present

## 2018-08-07 DIAGNOSIS — I150 Renovascular hypertension: Secondary | ICD-10-CM | POA: Diagnosis not present

## 2018-08-07 DIAGNOSIS — N186 End stage renal disease: Secondary | ICD-10-CM | POA: Diagnosis not present

## 2018-08-07 NOTE — Telephone Encounter (Signed)
This encounter was created in error - please disregard.

## 2018-08-08 DIAGNOSIS — R51 Headache: Secondary | ICD-10-CM | POA: Diagnosis not present

## 2018-08-08 DIAGNOSIS — D631 Anemia in chronic kidney disease: Secondary | ICD-10-CM | POA: Diagnosis not present

## 2018-08-08 DIAGNOSIS — N2581 Secondary hyperparathyroidism of renal origin: Secondary | ICD-10-CM | POA: Diagnosis not present

## 2018-08-08 DIAGNOSIS — N186 End stage renal disease: Secondary | ICD-10-CM | POA: Diagnosis not present

## 2018-08-11 ENCOUNTER — Other Ambulatory Visit: Payer: Self-pay | Admitting: *Deleted

## 2018-08-11 NOTE — Patient Outreach (Addendum)
[facility] [facility]) Care Management  08/11/2018  [place] B Medine May 04, 1941 [id number]  Post Acute Care Corrdination viist   This social worker met with the discharge planner at [facility] who confirmed that due to patient's medical condition, the plan is for patient to transition to long term care. Patient has medicaid, however per the discharge planner, she is working with patient's granddaughter to get her traditional Medicaid transitioned  to special assistance Medicaid. Per the discharge planner, patient is on the waiting list for a long term bed at [facility], however if the bed does not become available for any reason, she will assist patient's granddaughter with identifying another facility.   Plan: This Education officer, museum will confirm paln with patient's granddaughter [name].  Late entry: This Education officer, museum to sign off due to plan for patient to transition to long term care.   Sheralyn Boatman [facility] Care Management [phone]

## 2018-08-12 ENCOUNTER — Encounter: Payer: Self-pay | Admitting: *Deleted

## 2018-08-13 DIAGNOSIS — D631 Anemia in chronic kidney disease: Secondary | ICD-10-CM | POA: Diagnosis not present

## 2018-08-13 DIAGNOSIS — N186 End stage renal disease: Secondary | ICD-10-CM | POA: Diagnosis not present

## 2018-08-13 DIAGNOSIS — R51 Headache: Secondary | ICD-10-CM | POA: Diagnosis not present

## 2018-08-13 DIAGNOSIS — N2581 Secondary hyperparathyroidism of renal origin: Secondary | ICD-10-CM | POA: Diagnosis not present

## 2018-08-13 NOTE — Telephone Encounter (Signed)
This encounter was created in error - please disregard.

## 2018-08-17 DIAGNOSIS — N186 End stage renal disease: Secondary | ICD-10-CM | POA: Diagnosis not present

## 2018-08-17 DIAGNOSIS — D631 Anemia in chronic kidney disease: Secondary | ICD-10-CM | POA: Diagnosis not present

## 2018-08-17 DIAGNOSIS — N2581 Secondary hyperparathyroidism of renal origin: Secondary | ICD-10-CM | POA: Diagnosis not present

## 2018-08-17 DIAGNOSIS — R51 Headache: Secondary | ICD-10-CM | POA: Diagnosis not present

## 2018-08-18 DIAGNOSIS — N2581 Secondary hyperparathyroidism of renal origin: Secondary | ICD-10-CM | POA: Diagnosis not present

## 2018-08-18 DIAGNOSIS — R51 Headache: Secondary | ICD-10-CM | POA: Diagnosis not present

## 2018-08-18 DIAGNOSIS — D631 Anemia in chronic kidney disease: Secondary | ICD-10-CM | POA: Diagnosis not present

## 2018-08-18 DIAGNOSIS — N186 End stage renal disease: Secondary | ICD-10-CM | POA: Diagnosis not present

## 2018-08-20 DIAGNOSIS — D631 Anemia in chronic kidney disease: Secondary | ICD-10-CM | POA: Diagnosis not present

## 2018-08-20 DIAGNOSIS — N2581 Secondary hyperparathyroidism of renal origin: Secondary | ICD-10-CM | POA: Diagnosis not present

## 2018-08-20 DIAGNOSIS — N186 End stage renal disease: Secondary | ICD-10-CM | POA: Diagnosis not present

## 2018-08-20 DIAGNOSIS — R51 Headache: Secondary | ICD-10-CM | POA: Diagnosis not present

## 2018-08-22 DIAGNOSIS — N2581 Secondary hyperparathyroidism of renal origin: Secondary | ICD-10-CM | POA: Diagnosis not present

## 2018-08-22 DIAGNOSIS — D631 Anemia in chronic kidney disease: Secondary | ICD-10-CM | POA: Diagnosis not present

## 2018-08-22 DIAGNOSIS — R51 Headache: Secondary | ICD-10-CM | POA: Diagnosis not present

## 2018-08-22 DIAGNOSIS — N186 End stage renal disease: Secondary | ICD-10-CM | POA: Diagnosis not present

## 2018-08-25 DIAGNOSIS — N186 End stage renal disease: Secondary | ICD-10-CM | POA: Diagnosis not present

## 2018-08-25 DIAGNOSIS — N2581 Secondary hyperparathyroidism of renal origin: Secondary | ICD-10-CM | POA: Diagnosis not present

## 2018-08-25 DIAGNOSIS — D631 Anemia in chronic kidney disease: Secondary | ICD-10-CM | POA: Diagnosis not present

## 2018-08-25 DIAGNOSIS — R51 Headache: Secondary | ICD-10-CM | POA: Diagnosis not present

## 2018-08-26 DIAGNOSIS — N2581 Secondary hyperparathyroidism of renal origin: Secondary | ICD-10-CM | POA: Diagnosis not present

## 2018-08-26 DIAGNOSIS — N186 End stage renal disease: Secondary | ICD-10-CM | POA: Diagnosis not present

## 2018-08-26 DIAGNOSIS — E8770 Fluid overload, unspecified: Secondary | ICD-10-CM | POA: Diagnosis not present

## 2018-08-29 DIAGNOSIS — N186 End stage renal disease: Secondary | ICD-10-CM | POA: Diagnosis not present

## 2018-08-29 DIAGNOSIS — R51 Headache: Secondary | ICD-10-CM | POA: Diagnosis not present

## 2018-08-29 DIAGNOSIS — D631 Anemia in chronic kidney disease: Secondary | ICD-10-CM | POA: Diagnosis not present

## 2018-08-29 DIAGNOSIS — N2581 Secondary hyperparathyroidism of renal origin: Secondary | ICD-10-CM | POA: Diagnosis not present

## 2018-09-01 DIAGNOSIS — R51 Headache: Secondary | ICD-10-CM | POA: Diagnosis not present

## 2018-09-01 DIAGNOSIS — D631 Anemia in chronic kidney disease: Secondary | ICD-10-CM | POA: Diagnosis not present

## 2018-09-01 DIAGNOSIS — N186 End stage renal disease: Secondary | ICD-10-CM | POA: Diagnosis not present

## 2018-09-01 DIAGNOSIS — N2581 Secondary hyperparathyroidism of renal origin: Secondary | ICD-10-CM | POA: Diagnosis not present

## 2018-09-03 DIAGNOSIS — N186 End stage renal disease: Secondary | ICD-10-CM | POA: Diagnosis not present

## 2018-09-03 DIAGNOSIS — D631 Anemia in chronic kidney disease: Secondary | ICD-10-CM | POA: Diagnosis not present

## 2018-09-03 DIAGNOSIS — N2581 Secondary hyperparathyroidism of renal origin: Secondary | ICD-10-CM | POA: Diagnosis not present

## 2018-09-03 DIAGNOSIS — R51 Headache: Secondary | ICD-10-CM | POA: Diagnosis not present

## 2018-09-05 DIAGNOSIS — N2581 Secondary hyperparathyroidism of renal origin: Secondary | ICD-10-CM | POA: Diagnosis not present

## 2018-09-05 DIAGNOSIS — I150 Renovascular hypertension: Secondary | ICD-10-CM | POA: Diagnosis not present

## 2018-09-05 DIAGNOSIS — R51 Headache: Secondary | ICD-10-CM | POA: Diagnosis not present

## 2018-09-05 DIAGNOSIS — D631 Anemia in chronic kidney disease: Secondary | ICD-10-CM | POA: Diagnosis not present

## 2018-09-05 DIAGNOSIS — Z992 Dependence on renal dialysis: Secondary | ICD-10-CM | POA: Diagnosis not present

## 2018-09-05 DIAGNOSIS — N186 End stage renal disease: Secondary | ICD-10-CM | POA: Diagnosis not present

## 2018-09-06 DIAGNOSIS — N186 End stage renal disease: Secondary | ICD-10-CM | POA: Diagnosis not present

## 2018-09-06 DIAGNOSIS — Z992 Dependence on renal dialysis: Secondary | ICD-10-CM | POA: Diagnosis not present

## 2018-09-06 DIAGNOSIS — I150 Renovascular hypertension: Secondary | ICD-10-CM | POA: Diagnosis not present

## 2018-09-10 DIAGNOSIS — N2581 Secondary hyperparathyroidism of renal origin: Secondary | ICD-10-CM | POA: Diagnosis not present

## 2018-09-10 DIAGNOSIS — D631 Anemia in chronic kidney disease: Secondary | ICD-10-CM | POA: Diagnosis not present

## 2018-09-10 DIAGNOSIS — N186 End stage renal disease: Secondary | ICD-10-CM | POA: Diagnosis not present

## 2018-09-12 DIAGNOSIS — N186 End stage renal disease: Secondary | ICD-10-CM | POA: Diagnosis not present

## 2018-09-12 DIAGNOSIS — N2581 Secondary hyperparathyroidism of renal origin: Secondary | ICD-10-CM | POA: Diagnosis not present

## 2018-09-12 DIAGNOSIS — D631 Anemia in chronic kidney disease: Secondary | ICD-10-CM | POA: Diagnosis not present

## 2018-09-15 DIAGNOSIS — N186 End stage renal disease: Secondary | ICD-10-CM | POA: Diagnosis not present

## 2018-09-15 DIAGNOSIS — D631 Anemia in chronic kidney disease: Secondary | ICD-10-CM | POA: Diagnosis not present

## 2018-09-15 DIAGNOSIS — N2581 Secondary hyperparathyroidism of renal origin: Secondary | ICD-10-CM | POA: Diagnosis not present

## 2018-09-16 ENCOUNTER — Other Ambulatory Visit: Payer: Self-pay

## 2018-09-16 ENCOUNTER — Ambulatory Visit (INDEPENDENT_AMBULATORY_CARE_PROVIDER_SITE_OTHER): Payer: Medicare Other | Admitting: Internal Medicine

## 2018-09-16 ENCOUNTER — Encounter: Payer: Self-pay | Admitting: Internal Medicine

## 2018-09-16 VITALS — BP 109/50 | HR 90 | Temp 97.6°F | Ht 63.0 in

## 2018-09-16 DIAGNOSIS — Z8739 Personal history of other diseases of the musculoskeletal system and connective tissue: Secondary | ICD-10-CM | POA: Diagnosis not present

## 2018-09-16 DIAGNOSIS — Z79891 Long term (current) use of opiate analgesic: Secondary | ICD-10-CM

## 2018-09-16 DIAGNOSIS — M546 Pain in thoracic spine: Secondary | ICD-10-CM | POA: Diagnosis not present

## 2018-09-16 DIAGNOSIS — Z872 Personal history of diseases of the skin and subcutaneous tissue: Secondary | ICD-10-CM | POA: Diagnosis not present

## 2018-09-16 DIAGNOSIS — M869 Osteomyelitis, unspecified: Secondary | ICD-10-CM

## 2018-09-16 DIAGNOSIS — G8929 Other chronic pain: Secondary | ICD-10-CM

## 2018-09-16 MED ORDER — HYDROCODONE-ACETAMINOPHEN 5-325 MG PO TABS: 1.0000 | ORAL_TABLET | Freq: Four times a day (QID) | ORAL | 0 refills | Status: AC | PRN

## 2018-09-16 NOTE — Progress Notes (Signed)
Patient ID: Nancy [name], female   DOB: [birth year], 78 y.o.   MRN: [id number]  Case discussed with Dr. Eileen Stanford at the time of the visit. We reviewed the resident's history and exam and pertinent patient test results. I agree with the assessment, diagnosis, and plan of care documented in the resident's note.

## 2018-09-16 NOTE — Progress Notes (Signed)
   CC: Back pain  HPI:  [name] is a 78 y.o. African-American woman with medical history listed below presenting with back pain.  Please see problem based charting for further details.  Past Medical History:  Diagnosis Date  . Anemia   . Aortic stenosis   . Bacterial sinusitis 09/17/2011  . CHF (congestive heart failure) ([place])   . CKD (chronic kidney disease) stage 4, GFR 15-29 ml/min ([place]) 08/11/2006   Cr continues to increase. Proteinuria on UA 02/10/12.    . Colitis   . CVA (cerebrovascular accident) [facility])    New hemorrhagic per CT scan '09  . Diverticulosis of colon   . Dysfunctional uterine bleeding   . ESRD (end stage renal disease) on dialysis ([place])    "MWF; [facility]" (11/27/2017)  . Fecal impaction ([place])   . Headache(784.0)   . Heart murmur   . HERNIORRHAPHY, HX OF 08/11/2006  . Hypertension   . OA (osteoarthritis)    bilateral knees  . Postmenopausal   . Pulmonary nodule   . TINEA CRURIS 01/12/2007   Review of Systems: As per HPI  Physical Exam:  Vitals:   09/16/18 1042  BP: (!) 109/50  Pulse: 90  Temp: 97.6 F (36.4 C)  TempSrc: Oral  SpO2: 98%   Physical Exam Vitals signs reviewed.  Constitutional:      General: She is not in acute distress.    Comments: Elderly woman, quite pleasant, in no acute distress  Cardiovascular:     Rate and Rhythm: Normal rate and regular rhythm.     Heart sounds: Normal heart sounds.  Pulmonary:     Breath sounds: No wheezing, rhonchi or rales.  Musculoskeletal: Normal range of motion.        General: No tenderness or deformity.  Neurological:     General: No focal deficit present.     Mental Status: She is alert.     Sensory: No sensory deficit (Patella and Achilles reflexes intact).     Motor: Weakness: 5/5 bilateral lower extremity.  Psychiatric:        Mood and Affect: Mood normal.        Behavior: Behavior normal.     Assessment & Plan:   See Encounters Tab for problem based charting.  Patient  discussed with Dr. Eppie Gibson

## 2018-09-16 NOTE — Patient Instructions (Signed)
Nancy [name],  It was a pleasure taking care of you here at the clinic and I am quite sorry to hear about the back pain you are experiencing.  As we discussed I believe it is smart for [place] to have the MRI of your back which was suggested after your recent hospitalization.  And we had mentioned to me that Percocet makes you sleepy and I am inclined to prescribe you a short course of Norco (which is another pain medication) however at a decreased dose.  You take this medication every 6 hours as needed for your back pain for only 5 days.  Depending on what the MRI results show I will call you and discuss other options of management.  Dr. Eileen Stanford  Please call the internal medicine center clinic if you have any questions or concerns, we may be able to help and keep you from a long and expensive emergency room wait. Our clinic and after hours phone number is [phone], the best time to call is Monday through Friday 9 am to 4 pm but there is always someone available 24/7 if you have an emergency. If you need medication refills please notify your pharmacy one week in advance and they will send [place] a request.

## 2018-09-16 NOTE — Assessment & Plan Note (Addendum)
Thoracic back pain: [name] has a known history of midline thoracic back pain that had been previously evaluated by Dr. Posey Pronto.  Today she presents to the clinic complaining of a 4-year history of pain located at the mid thorax.  She rates the pain at 10/10 and states the pain is worse when she lies down or sits up.  She has previously tried Tylenol, lidocaine, muscle relaxants and Percocet however has not had any relief.  Per chart review, she was recently admitted to the hospital from July 03, 2018 to July 29, 2018 and was found to have osteomyelitis of T12-L1 with epidural abscess.  She underwent IR guided drainage of abscess for which cultures grew pan sensitive pseudomonas aeruginosa.  She was initially started on IV antibiotics and was transitioned to ciprofloxacin to complete if 4 to 6-week course.  She was asked to have a follow-up MRI performed however this has not been done.  Currently she denies fevers, chills, lower extremity weakness, urinary or bowel incontinence or any new numbness or tingling in her lower extremities.  On physical exams, motor strength is 5/5, patella and Achilles reflexes are intact and there is no sensory or neurological deficits.  Assessment and plan: Known history of midline thoracic back pain refractory to multiple medications.  Recent diagnosis of osteomyelitis status post IV and p.o. antibiotics.  Currently with no sensory or neurological deficits.  There is disconcordance between subjective and objective findings during my assessment.  Plan - MRI of thoracic spine order has been placed - Short course of Norco 5-325 mg every 6 hours as needed back pain #20 has been prescribed - She is to continue physical therapy at her skilled nursing facility. -We will give strict return precautions

## 2018-09-17 DIAGNOSIS — N2581 Secondary hyperparathyroidism of renal origin: Secondary | ICD-10-CM | POA: Diagnosis not present

## 2018-09-17 DIAGNOSIS — D631 Anemia in chronic kidney disease: Secondary | ICD-10-CM | POA: Diagnosis not present

## 2018-09-17 DIAGNOSIS — N186 End stage renal disease: Secondary | ICD-10-CM | POA: Diagnosis not present

## 2018-09-18 DIAGNOSIS — D631 Anemia in chronic kidney disease: Secondary | ICD-10-CM | POA: Diagnosis not present

## 2018-09-18 DIAGNOSIS — Z992 Dependence on renal dialysis: Secondary | ICD-10-CM | POA: Diagnosis not present

## 2018-09-18 DIAGNOSIS — N186 End stage renal disease: Secondary | ICD-10-CM | POA: Diagnosis not present

## 2018-09-18 DIAGNOSIS — I132 Hypertensive heart and chronic kidney disease with heart failure and with stage 5 chronic kidney disease, or end stage renal disease: Secondary | ICD-10-CM | POA: Diagnosis not present

## 2018-09-19 DIAGNOSIS — N186 End stage renal disease: Secondary | ICD-10-CM | POA: Diagnosis not present

## 2018-09-19 DIAGNOSIS — D631 Anemia in chronic kidney disease: Secondary | ICD-10-CM | POA: Diagnosis not present

## 2018-09-19 DIAGNOSIS — N2581 Secondary hyperparathyroidism of renal origin: Secondary | ICD-10-CM | POA: Diagnosis not present

## 2018-09-22 DIAGNOSIS — N186 End stage renal disease: Secondary | ICD-10-CM | POA: Diagnosis not present

## 2018-09-22 DIAGNOSIS — D631 Anemia in chronic kidney disease: Secondary | ICD-10-CM | POA: Diagnosis not present

## 2018-09-22 DIAGNOSIS — N2581 Secondary hyperparathyroidism of renal origin: Secondary | ICD-10-CM | POA: Diagnosis not present

## 2018-09-25 DIAGNOSIS — Z79899 Other long term (current) drug therapy: Secondary | ICD-10-CM | POA: Diagnosis not present

## 2018-09-25 DIAGNOSIS — I132 Hypertensive heart and chronic kidney disease with heart failure and with stage 5 chronic kidney disease, or end stage renal disease: Secondary | ICD-10-CM | POA: Diagnosis not present

## 2018-09-25 DIAGNOSIS — D631 Anemia in chronic kidney disease: Secondary | ICD-10-CM | POA: Diagnosis not present

## 2018-09-25 DIAGNOSIS — Z992 Dependence on renal dialysis: Secondary | ICD-10-CM | POA: Diagnosis not present

## 2018-09-25 DIAGNOSIS — N186 End stage renal disease: Secondary | ICD-10-CM | POA: Diagnosis not present

## 2018-09-28 DIAGNOSIS — Z992 Dependence on renal dialysis: Secondary | ICD-10-CM | POA: Diagnosis not present

## 2018-09-28 DIAGNOSIS — R601 Generalized edema: Secondary | ICD-10-CM | POA: Diagnosis not present

## 2018-09-28 DIAGNOSIS — M545 Low back pain: Secondary | ICD-10-CM | POA: Diagnosis not present

## 2018-09-28 DIAGNOSIS — G8929 Other chronic pain: Secondary | ICD-10-CM | POA: Diagnosis not present

## 2018-09-29 DIAGNOSIS — N186 End stage renal disease: Secondary | ICD-10-CM | POA: Diagnosis not present

## 2018-09-29 DIAGNOSIS — N2581 Secondary hyperparathyroidism of renal origin: Secondary | ICD-10-CM | POA: Diagnosis not present

## 2018-09-29 DIAGNOSIS — D631 Anemia in chronic kidney disease: Secondary | ICD-10-CM | POA: Diagnosis not present

## 2018-10-01 DIAGNOSIS — N186 End stage renal disease: Secondary | ICD-10-CM | POA: Diagnosis not present

## 2018-10-01 DIAGNOSIS — N2581 Secondary hyperparathyroidism of renal origin: Secondary | ICD-10-CM | POA: Diagnosis not present

## 2018-10-01 DIAGNOSIS — D631 Anemia in chronic kidney disease: Secondary | ICD-10-CM | POA: Diagnosis not present

## 2018-10-01 DIAGNOSIS — R110 Nausea: Secondary | ICD-10-CM | POA: Diagnosis not present

## 2018-10-01 DIAGNOSIS — M545 Low back pain: Secondary | ICD-10-CM | POA: Diagnosis not present

## 2018-10-03 ENCOUNTER — Inpatient Hospital Stay (HOSPITAL_COMMUNITY)
Admission: EM | Admit: 2018-10-03 | Discharge: 2018-10-07 | DRG: 853 | Disposition: A | Discharge status: Home Health | Payer: Medicare Other | Attending: Internal Medicine | Admitting: Internal Medicine

## 2018-10-03 ENCOUNTER — Encounter (HOSPITAL_COMMUNITY): Payer: Self-pay | Admitting: Internal Medicine

## 2018-10-03 ENCOUNTER — Inpatient Hospital Stay (HOSPITAL_COMMUNITY): Payer: Medicare Other

## 2018-10-03 DIAGNOSIS — Z8673 Personal history of transient ischemic attack (TIA), and cerebral infarction without residual deficits: Secondary | ICD-10-CM

## 2018-10-03 DIAGNOSIS — R680 Hypothermia, not associated with low environmental temperature: Secondary | ICD-10-CM | POA: Diagnosis not present

## 2018-10-03 DIAGNOSIS — I120 Hypertensive chronic kidney disease with stage 5 chronic kidney disease or end stage renal disease: Secondary | ICD-10-CM | POA: Diagnosis not present

## 2018-10-03 DIAGNOSIS — Z885 Allergy status to narcotic agent status: Secondary | ICD-10-CM

## 2018-10-03 DIAGNOSIS — I9589 Other hypotension: Secondary | ICD-10-CM | POA: Diagnosis present

## 2018-10-03 DIAGNOSIS — I472 Ventricular tachycardia: Secondary | ICD-10-CM | POA: Diagnosis not present

## 2018-10-03 DIAGNOSIS — G062 Extradural and subdural abscess, unspecified: Secondary | ICD-10-CM | POA: Diagnosis not present

## 2018-10-03 DIAGNOSIS — H5711 Ocular pain, right eye: Secondary | ICD-10-CM | POA: Diagnosis not present

## 2018-10-03 DIAGNOSIS — I82A11 Acute embolism and thrombosis of right axillary vein: Secondary | ICD-10-CM | POA: Diagnosis not present

## 2018-10-03 DIAGNOSIS — I82621 Acute embolism and thrombosis of deep veins of right upper extremity: Secondary | ICD-10-CM | POA: Diagnosis not present

## 2018-10-03 DIAGNOSIS — T82838A Hemorrhage of vascular prosthetic devices, implants and grafts, initial encounter: Secondary | ICD-10-CM | POA: Diagnosis present

## 2018-10-03 DIAGNOSIS — R918 Other nonspecific abnormal finding of lung field: Secondary | ICD-10-CM | POA: Diagnosis not present

## 2018-10-03 DIAGNOSIS — Z91048 Other nonmedicinal substance allergy status: Secondary | ICD-10-CM

## 2018-10-03 DIAGNOSIS — Z79899 Other long term (current) drug therapy: Secondary | ICD-10-CM | POA: Diagnosis not present

## 2018-10-03 DIAGNOSIS — Z79891 Long term (current) use of opiate analgesic: Secondary | ICD-10-CM

## 2018-10-03 DIAGNOSIS — M2550 Pain in unspecified joint: Secondary | ICD-10-CM | POA: Diagnosis not present

## 2018-10-03 DIAGNOSIS — Z7401 Bed confinement status: Secondary | ICD-10-CM | POA: Diagnosis not present

## 2018-10-03 DIAGNOSIS — I5032 Chronic diastolic (congestive) heart failure: Secondary | ICD-10-CM | POA: Diagnosis not present

## 2018-10-03 DIAGNOSIS — R55 Syncope and collapse: Secondary | ICD-10-CM

## 2018-10-03 DIAGNOSIS — R58 Hemorrhage, not elsewhere classified: Secondary | ICD-10-CM

## 2018-10-03 DIAGNOSIS — E46 Unspecified protein-calorie malnutrition: Secondary | ICD-10-CM | POA: Diagnosis present

## 2018-10-03 DIAGNOSIS — Z8249 Family history of ischemic heart disease and other diseases of the circulatory system: Secondary | ICD-10-CM

## 2018-10-03 DIAGNOSIS — I132 Hypertensive heart and chronic kidney disease with heart failure and with stage 5 chronic kidney disease, or end stage renal disease: Secondary | ICD-10-CM | POA: Diagnosis not present

## 2018-10-03 DIAGNOSIS — J189 Pneumonia, unspecified organism: Secondary | ICD-10-CM | POA: Diagnosis present

## 2018-10-03 DIAGNOSIS — I959 Hypotension, unspecified: Secondary | ICD-10-CM | POA: Diagnosis not present

## 2018-10-03 DIAGNOSIS — E8889 Other specified metabolic disorders: Secondary | ICD-10-CM | POA: Diagnosis not present

## 2018-10-03 DIAGNOSIS — E785 Hyperlipidemia, unspecified: Secondary | ICD-10-CM | POA: Diagnosis not present

## 2018-10-03 DIAGNOSIS — K219 Gastro-esophageal reflux disease without esophagitis: Secondary | ICD-10-CM | POA: Diagnosis not present

## 2018-10-03 DIAGNOSIS — F0390 Unspecified dementia without behavioral disturbance: Secondary | ICD-10-CM | POA: Diagnosis present

## 2018-10-03 DIAGNOSIS — I059 Rheumatic mitral valve disease, unspecified: Secondary | ICD-10-CM | POA: Diagnosis not present

## 2018-10-03 DIAGNOSIS — Z9049 Acquired absence of other specified parts of digestive tract: Secondary | ICD-10-CM

## 2018-10-03 DIAGNOSIS — Z992 Dependence on renal dialysis: Secondary | ICD-10-CM | POA: Diagnosis not present

## 2018-10-03 DIAGNOSIS — Z515 Encounter for palliative care: Secondary | ICD-10-CM | POA: Diagnosis not present

## 2018-10-03 DIAGNOSIS — Y841 Kidney dialysis as the cause of abnormal reaction of the patient, or of later complication, without mention of misadventure at the time of the procedure: Secondary | ICD-10-CM | POA: Diagnosis present

## 2018-10-03 DIAGNOSIS — Z66 Do not resuscitate: Secondary | ICD-10-CM | POA: Diagnosis not present

## 2018-10-03 DIAGNOSIS — E162 Hypoglycemia, unspecified: Secondary | ICD-10-CM | POA: Diagnosis present

## 2018-10-03 DIAGNOSIS — D631 Anemia in chronic kidney disease: Secondary | ICD-10-CM | POA: Diagnosis not present

## 2018-10-03 DIAGNOSIS — Z6820 Body mass index (BMI) 20.0-20.9, adult: Secondary | ICD-10-CM

## 2018-10-03 DIAGNOSIS — I359 Nonrheumatic aortic valve disorder, unspecified: Secondary | ICD-10-CM | POA: Diagnosis not present

## 2018-10-03 DIAGNOSIS — Y95 Nosocomial condition: Secondary | ICD-10-CM | POA: Diagnosis present

## 2018-10-03 DIAGNOSIS — D696 Thrombocytopenia, unspecified: Secondary | ICD-10-CM | POA: Diagnosis not present

## 2018-10-03 DIAGNOSIS — M7989 Other specified soft tissue disorders: Secondary | ICD-10-CM | POA: Diagnosis not present

## 2018-10-03 DIAGNOSIS — J181 Lobar pneumonia, unspecified organism: Secondary | ICD-10-CM | POA: Diagnosis not present

## 2018-10-03 DIAGNOSIS — R011 Cardiac murmur, unspecified: Secondary | ICD-10-CM | POA: Diagnosis not present

## 2018-10-03 DIAGNOSIS — N2581 Secondary hyperparathyroidism of renal origin: Secondary | ICD-10-CM | POA: Diagnosis present

## 2018-10-03 DIAGNOSIS — M546 Pain in thoracic spine: Secondary | ICD-10-CM | POA: Diagnosis not present

## 2018-10-03 DIAGNOSIS — A419 Sepsis, unspecified organism: Secondary | ICD-10-CM | POA: Diagnosis present

## 2018-10-03 DIAGNOSIS — J918 Pleural effusion in other conditions classified elsewhere: Secondary | ICD-10-CM | POA: Diagnosis not present

## 2018-10-03 DIAGNOSIS — N186 End stage renal disease: Secondary | ICD-10-CM | POA: Diagnosis present

## 2018-10-03 DIAGNOSIS — I208 Other forms of angina pectoris: Secondary | ICD-10-CM | POA: Diagnosis not present

## 2018-10-03 DIAGNOSIS — D638 Anemia in other chronic diseases classified elsewhere: Secondary | ICD-10-CM | POA: Diagnosis present

## 2018-10-03 DIAGNOSIS — R1084 Generalized abdominal pain: Secondary | ICD-10-CM | POA: Diagnosis not present

## 2018-10-03 DIAGNOSIS — I5030 Unspecified diastolic (congestive) heart failure: Secondary | ICD-10-CM | POA: Diagnosis not present

## 2018-10-03 DIAGNOSIS — R609 Edema, unspecified: Secondary | ICD-10-CM | POA: Diagnosis not present

## 2018-10-03 DIAGNOSIS — R627 Adult failure to thrive: Secondary | ICD-10-CM | POA: Diagnosis present

## 2018-10-03 DIAGNOSIS — Z90710 Acquired absence of both cervix and uterus: Secondary | ICD-10-CM

## 2018-10-03 DIAGNOSIS — Z7982 Long term (current) use of aspirin: Secondary | ICD-10-CM

## 2018-10-03 LAB — CBC WITH DIFFERENTIAL/PLATELET
Abs Immature Granulocytes: 0.13 10*3/uL — ABNORMAL HIGH (ref 0.00–0.07)
BASOS PCT: 0 %
Basophils Absolute: 0 10*3/uL (ref 0.0–0.1)
EOS PCT: 0 %
Eosinophils Absolute: 0 10*3/uL (ref 0.0–0.5)
HCT: 32.1 % — ABNORMAL LOW (ref 36.0–46.0)
Hemoglobin: 9.8 g/dL — ABNORMAL LOW (ref 12.0–15.0)
Immature Granulocytes: 2 %
Lymphocytes Relative: 11 %
Lymphs Abs: 0.9 10*3/uL (ref 0.7–4.0)
MCH: 26.3 pg (ref 26.0–34.0)
MCHC: 30.5 g/dL (ref 30.0–36.0)
MCV: 86.1 fL (ref 80.0–100.0)
Monocytes Absolute: 0.5 10*3/uL (ref 0.1–1.0)
Monocytes Relative: 6 %
Neutro Abs: 6.9 10*3/uL (ref 1.7–7.7)
Neutrophils Relative %: 81 %
Platelets: 89 10*3/uL — ABNORMAL LOW (ref 150–400)
RBC: 3.73 MIL/uL — ABNORMAL LOW (ref 3.87–5.11)
RDW: 19.5 % — ABNORMAL HIGH (ref 11.5–15.5)
WBC: 8.4 10*3/uL (ref 4.0–10.5)
nRBC: 0 % (ref 0.0–0.2)

## 2018-10-03 LAB — CBG MONITORING, ED
GLUCOSE-CAPILLARY: 99 mg/dL (ref 70–99)
Glucose-Capillary: 60 mg/dL — ABNORMAL LOW (ref 70–99)
Glucose-Capillary: 51 mg/dL — ABNORMAL LOW (ref 70–99)
Glucose-Capillary: 53 mg/dL — ABNORMAL LOW (ref 70–99)
Glucose-Capillary: 33 mg/dL — CL (ref 70–99)
Glucose-Capillary: 40 mg/dL — CL (ref 70–99)
Glucose-Capillary: 116 mg/dL — ABNORMAL HIGH (ref 70–99)

## 2018-10-03 LAB — BASIC METABOLIC PANEL
Anion gap: 16 — ABNORMAL HIGH (ref 5–15)
BUN: 37 mg/dL — AB (ref 8–23)
CO2: 19 mmol/L — ABNORMAL LOW (ref 22–32)
Calcium: 7.8 mg/dL — ABNORMAL LOW (ref 8.9–10.3)
Chloride: 98 mmol/L (ref 98–111)
Creatinine, Ser: 5.17 mg/dL — ABNORMAL HIGH (ref 0.44–1.00)
GFR calc Af Amer: 9 mL/min — ABNORMAL LOW (ref >60)
GFR calc non Af Amer: 8 mL/min — ABNORMAL LOW (ref >60)
GLUCOSE: 56 mg/dL — AB (ref 70–99)
Potassium: 5.1 mmol/L (ref 3.5–5.1)
Sodium: 133 mmol/L — ABNORMAL LOW (ref 135–145)

## 2018-10-03 LAB — GLUCOSE, CAPILLARY
Glucose-Capillary: 106 mg/dL — ABNORMAL HIGH (ref 70–99)
Glucose-Capillary: 134 mg/dL — ABNORMAL HIGH (ref 70–99)

## 2018-10-03 LAB — LACTIC ACID, PLASMA: Lactic Acid, Venous: 4 mmol/L (ref 0.5–1.9)

## 2018-10-03 MED ORDER — GLUCOSE 40 % PO GEL
ORAL | Status: AC
  Administered 2018-10-03: 37.5 g
  Filled 2018-10-03: qty 1

## 2018-10-03 MED ORDER — ASPIRIN EC 81 MG PO TBEC
81.0000 mg | DELAYED_RELEASE_TABLET | Freq: Every day | ORAL | Status: DC
  Administered 2018-10-03 – 2018-10-06 (×3): 81 mg via ORAL
  Filled 2018-10-03 (×3): qty 1

## 2018-10-03 MED ORDER — LIDOCAINE-EPINEPHRINE (PF) 2 %-1:200000 IJ SOLN
INTRAMUSCULAR | Status: AC
  Filled 2018-10-03: qty 20

## 2018-10-03 MED ORDER — SODIUM CHLORIDE 0.9 % IV BOLUS
500.0000 mL | Freq: Once | INTRAVENOUS | Status: AC
  Administered 2018-10-03: 500 mL via INTRAVENOUS

## 2018-10-03 MED ORDER — SODIUM CHLORIDE 0.9 % IV SOLN: 1.0000 g | INTRAVENOUS | Status: DC

## 2018-10-03 MED ORDER — DEXTROSE 50 % IV SOLN
50.0000 mL | Freq: Once | INTRAVENOUS | Status: AC
  Administered 2018-10-03: 50 mL via INTRAVENOUS
  Filled 2018-10-03: qty 50

## 2018-10-03 MED ORDER — HEPARIN SODIUM (PORCINE) 5000 UNIT/ML IJ SOLN
5000.0000 [IU] | Freq: Three times a day (TID) | INTRAMUSCULAR | Status: DC
  Administered 2018-10-03 – 2018-10-04 (×3): 5000 [IU] via SUBCUTANEOUS
  Filled 2018-10-03 (×3): qty 1

## 2018-10-03 MED ORDER — SODIUM CHLORIDE 0.9 % IV SOLN
1.0000 g | INTRAVENOUS | Status: DC
  Administered 2018-10-04: 1 g via INTRAVENOUS
  Filled 2018-10-03 (×3): qty 10

## 2018-10-03 MED ORDER — MEMANTINE HCL 5 MG PO TABS
5.0000 mg | ORAL_TABLET | Freq: Two times a day (BID) | ORAL | Status: DC
  Administered 2018-10-03 – 2018-10-07 (×7): 5 mg via ORAL
  Filled 2018-10-03 (×7): qty 1

## 2018-10-03 MED ORDER — CALCITRIOL 0.5 MCG PO CAPS: 2.0000 ug | ORAL_CAPSULE | ORAL | Status: DC

## 2018-10-03 MED ORDER — LIDOCAINE-EPINEPHRINE 2 %-1:100000 IJ SOLN
20.0000 mL | Freq: Once | INTRAMUSCULAR | Status: DC
  Filled 2018-10-03: qty 20

## 2018-10-03 MED ORDER — MIRTAZAPINE 7.5 MG PO TABS
15.0000 mg | ORAL_TABLET | Freq: Every day | ORAL | Status: DC
  Administered 2018-10-03 – 2018-10-06 (×4): 15 mg via ORAL
  Filled 2018-10-03 (×4): qty 2

## 2018-10-03 MED ORDER — DEXTROSE 50 % IV SOLN
1.0000 | INTRAVENOUS | Status: DC | PRN
  Administered 2018-10-04 (×2): 25 mL via INTRAVENOUS
  Filled 2018-10-03: qty 50

## 2018-10-03 MED ORDER — OLANZAPINE 2.5 MG PO TABS
2.5000 mg | ORAL_TABLET | Freq: Every day | ORAL | Status: DC
  Administered 2018-10-03 – 2018-10-06 (×4): 2.5 mg via ORAL
  Filled 2018-10-03 (×5): qty 1

## 2018-10-03 MED ORDER — ACETAMINOPHEN 650 MG RE SUPP: 650.0000 mg | Freq: Four times a day (QID) | RECTAL | Status: DC | PRN

## 2018-10-03 MED ORDER — ACETAMINOPHEN 325 MG PO TABS
650.0000 mg | ORAL_TABLET | Freq: Four times a day (QID) | ORAL | Status: DC | PRN
  Administered 2018-10-04 – 2018-10-06 (×2): 650 mg via ORAL
  Filled 2018-10-03 (×2): qty 2

## 2018-10-03 MED ORDER — MIDODRINE HCL 5 MG PO TABS
10.0000 mg | ORAL_TABLET | ORAL | Status: DC
  Administered 2018-10-07: 10 mg via ORAL
  Filled 2018-10-03: qty 2

## 2018-10-03 MED ORDER — LIDOCAINE-EPINEPHRINE (PF) 2 %-1:200000 IJ SOLN
20.0000 mL | Freq: Once | INTRAMUSCULAR | Status: AC
  Administered 2018-10-03: 20 mL

## 2018-10-03 MED ORDER — LATANOPROST 0.005 % OP SOLN
1.0000 [drp] | Freq: Every day | OPHTHALMIC | Status: DC
  Administered 2018-10-03 – 2018-10-06 (×4): 1 [drp] via OPHTHALMIC
  Filled 2018-10-03: qty 2.5

## 2018-10-03 MED ORDER — SENNOSIDES-DOCUSATE SODIUM 8.6-50 MG PO TABS
1.0000 | ORAL_TABLET | Freq: Every evening | ORAL | Status: DC | PRN
  Administered 2018-10-04: 1 via ORAL
  Filled 2018-10-03: qty 1

## 2018-10-03 MED ORDER — ONDANSETRON 4 MG PO TBDP
4.0000 mg | ORAL_TABLET | Freq: Once | ORAL | Status: AC
  Administered 2018-10-03: 4 mg via ORAL
  Filled 2018-10-03: qty 1

## 2018-10-03 NOTE — ED Notes (Signed)
ED TO INPATIENT HANDOFF REPORT  ED Nurse Name and Phone #: Caryl Comes 43  S Name/Age/Gender [place] Schweizer 78 y.o. female Room/Bed: 041C/041C  Code Status   Code Status: Prior  Home/SNF/Other Home Patient oriented to: self Is this baseline? Yes   Triage Complete: Triage complete  Chief Complaint syncope  Triage Note Pt here from dialysis center after syncopal episode during dialysis tx this morning. Received 1 hour of tx. Per staff at facility, pt became unresponsive with agonal respirations. Upon EMS arrival, respirations 18 and pt alert to person (at baseline). Pt alert to baseline for this RN.    Allergies Allergies  Allergen Reactions  . Hydrocodone Nausea And Vomiting and Other (See Comments)    Dizziness, also  . Tape Other (See Comments)    TAPE LEAVES DARK PATCHES ON THE SKIN!!    Level of Care/Admitting Diagnosis ED Disposition    ED Disposition Condition [facility] Area: [place] [[id number]]  Level of Care: Progressive [102]  Diagnosis: Hypoglycemia [601093]  Admitting Physician: Bosie Helper  Attending Physician: Lucious Groves [[id number]]  Estimated length of stay: past midnight tomorrow  Certification:: I certify this patient will need inpatient services for at least 2 midnights  PT Class (Do Not Modify): Inpatient [101]  PT Acc Code (Do Not Modify): Private [1]       B Medical/Surgery History Past Medical History:  Diagnosis Date  . Anemia   . Aortic stenosis   . Bacterial sinusitis 09/17/2011  . CHF (congestive heart failure) ([place])   . CKD (chronic kidney disease) stage 4, GFR 15-29 ml/min ([place]) 08/11/2006   Cr continues to increase. Proteinuria on UA 02/10/12.    . Colitis   . CVA (cerebrovascular accident) [facility])    New hemorrhagic per CT scan '09  . Diverticulosis of colon   . Dysfunctional uterine bleeding   . ESRD (end stage renal disease) on dialysis ([place])    "MWF; [facility]" (11/27/2017)  . Fecal  impaction ([place])   . Headache(784.0)   . Heart murmur   . HERNIORRHAPHY, HX OF 08/11/2006  . Hypertension   . OA (osteoarthritis)    bilateral knees  . Postmenopausal   . Pulmonary nodule   . TINEA CRURIS 01/12/2007   Past Surgical History:  Procedure Laterality Date  . ABDOMINAL HYSTERECTOMY    . AV FISTULA PLACEMENT Left 02/19/2017   Procedure: CREATION OF LEFT ARM BRACHIOCEPHALIC ARTERIOVENOUS (AV) FISTULA;  Surgeon: Rosetta Posner, MD;  Location: [place];  Service: Vascular;  Laterality: Left;  . BASCILIC VEIN TRANSPOSITION Left 04/23/2017   Procedure: BASILIC VEIN TRANSPOSITION SECOND STAGE;  Surgeon: Rosetta Posner, MD;  Location: [place];  Service: Vascular;  Laterality: Left;  . BIOPSY  02/20/2018   Procedure: BIOPSY;  Surgeon: Carol Ada, MD;  Location: [facility];  Service: Endoscopy;;  . BIOPSY  03/07/2018   Procedure: BIOPSY;  Surgeon: Carol Ada, MD;  Location[facility] ENDOSCOPY;  Service: Endoscopy;;  . CHOLECYSTECTOMY  2009  . COLONOSCOPY    . ESOPHAGOGASTRODUODENOSCOPY N/A 03/07/2018   Procedure: ESOPHAGOGASTRODUODENOSCOPY (EGD);  Surgeon: Carol Ada, MD;  Location: [place];  Service: Endoscopy;  Laterality: N/A;  . ESOPHAGOGASTRODUODENOSCOPY (EGD) WITH PROPOFOL N/A 02/20/2018   Procedure: ESOPHAGOGASTRODUODENOSCOPY (EGD) WITH PROPOFOL;  Surgeon: Carol Ada, MD;  Location: [facility] ENDOSCOPY;  Service: Endoscopy;  Laterality: N/A;  . INGUINAL HERNIA REPAIR  2008  . INSERTION OF DIALYSIS CATHETER Right 02/19/2017   Procedure: INSERTION OF  TUNNELED DIALYSIS CATHETER - RIGHT INTERNAL JUGULAR PLACEMENT;  Surgeon: Rosetta Posner, MD;  Location: [place];  Service: Vascular;  Laterality: Right;  . IRIDOTOMY / IRIDECTOMY     Laser, right eye 12/26/11 left eye 01/24/12  . MASS EXCISION Left 05/07/2013   Procedure: EXCISION CYST;  Surgeon: Myrtha Mantis., MD;  Location: [place];  Service: Ophthalmology;  Laterality: Left;  . SAVORY DILATION N/A 02/20/2018    Procedure: SAVORY DILATION;  Surgeon: Carol Ada, MD;  Location: [facility] ENDOSCOPY;  Service: Endoscopy;  Laterality: N/A;     A IV Location/Drains/Wounds Patient Lines/Drains/Airways Status   Active Line/Drains/Airways    Name:   Placement date:   Placement time:   Site:   Days:   Peripheral IV 10/03/18 Right;Upper Arm   10/03/18    1503    Arm   less than 1   Fistula / Graft Left Upper arm Arteriovenous fistula   02/19/17    0956    Upper arm   591   Incision (Closed) 07/04/18 Back Lower   07/04/18    1524     91          Intake/Output Last 24 hours No intake or output data in the 24 hours ending 10/03/18 1628  Labs/Imaging Results for orders placed or performed during the hospital encounter of 10/03/18 (from the past 48 hour(s))  CBG monitoring, ED     Status: Abnormal   Collection Time: 10/03/18 10:10 AM  Result Value Ref Range   Glucose-Capillary 60 (L) 70 - 99 mg/dL  CBG monitoring, ED     Status: Abnormal   Collection Time: 10/03/18 10:48 AM  Result Value Ref Range   Glucose-Capillary 53 (L) 70 - 99 mg/dL  POCT I-Stat EG7     Status: Abnormal   Collection Time: 10/03/18 11:18 AM  Result Value Ref Range   pH, Ven 7.499 (H) 7.250 - 7.430   pCO2, Ven 31.7 (L) 44.0 - 60.0 mmHg   pO2, Ven 49.0 (H) 32.0 - 45.0 mmHg   Bicarbonate 24.6 20.0 - 28.0 mmol/L   TCO2 26 22 - 32 mmol/L   O2 Saturation 88.0 %   Acid-Base Excess 2.0 0.0 - 2.0 mmol/L   Sodium 129 (L) 135 - 145 mmol/L   Potassium >8.5 (HH) 3.5 - 5.1 mmol/L   Calcium, Ion 0.77 (LL) 1.15 - 1.40 mmol/L   HCT 33.0 (L) 36.0 - 46.0 %   Hemoglobin 11.2 (L) 12.0 - 15.0 g/dL   Patient temperature HIDE    Sample type VENOUS    Comment NOTIFIED PHYSICIAN   CBG monitoring, ED     Status: Abnormal   Collection Time: 10/03/18 11:19 AM  Result Value Ref Range   Glucose-Capillary 51 (L) 70 - 99 mg/dL  Basic metabolic panel     Status: Abnormal   Collection Time: 10/03/18 11:30 AM  Result Value Ref Range   Sodium 133 (L) 135 -  145 mmol/L   Potassium 5.1 3.5 - 5.1 mmol/L    Comment: DELTA CHECK NOTED   Chloride 98 98 - 111 mmol/L   CO2 19 (L) 22 - 32 mmol/L   Glucose, Bld 56 (L) 70 - 99 mg/dL   BUN 37 (H) 8 - 23 mg/dL   Creatinine, Ser 5.17 (H) 0.44 - 1.00 mg/dL   Calcium 7.8 (L) 8.9 - 10.3 mg/dL   GFR calc non Af Amer 8 (L) >60 mL/min   GFR calc Af Amer 9 (L) >  60 mL/min   Anion gap 16 (H) 5 - 15    Comment: Performed at [facility] [address] [place], [place] [address]  CBG monitoring, ED     Status: Abnormal   Collection Time: 10/03/18  1:46 PM  Result Value Ref Range   Glucose-Capillary 33 (LL) 70 - 99 mg/dL   Comment 1 Notify RN   CBG monitoring, ED     Status: Abnormal   Collection Time: 10/03/18  2:26 PM  Result Value Ref Range   Glucose-Capillary 40 (LL) 70 - 99 mg/dL   Comment 1 Notify RN   POC CBG, ED     Status: Abnormal   Collection Time: 10/03/18  2:54 PM  Result Value Ref Range   Glucose-Capillary 116 (H) 70 - 99 mg/dL  CBG monitoring, ED     Status: None   Collection Time: 10/03/18  3:44 PM  Result Value Ref Range   Glucose-Capillary 99 70 - 99 mg/dL   No results found.  Pending Labs Unresulted Labs (From admission, onward)    Start     Ordered   10/03/18 1002  CBC with Differential  Once,   STAT     10/03/18 1001   10/03/18 1002  Sample to Blood Bank  Once,   STAT     10/03/18 1001   Signed and Held  Basic metabolic panel  Tomorrow morning,   R     Signed and Held   Signed and Held  CBC  Tomorrow morning,   R     Signed and Held          Vitals/Pain Today's Vitals   10/03/18 1230 10/03/18 1300 10/03/18 1330 10/03/18 1438  BP: (!) 80/47 (!) 94/50 (!) 85/40 (!) 70/32  Pulse:  86  95  Resp: 11 13 11 20   Temp:      TempSrc:      SpO2:  100%  100%    Isolation Precautions No active isolations  Medications Medications  dextrose 50 % solution 50 mL (has no administration in time range)  dextrose (GLUTOSE) 40 % oral gel (37.5 g  Given 10/03/18 1015)   lidocaine-EPINEPHrine (XYLOCAINE W/EPI) 2 %-1:200000 (PF) injection 20 mL (20 mLs Other Given by Other 10/03/18 1007)  dextrose (GLUTOSE) 40 % oral gel (37.5 g  Given 10/03/18 1125)  ondansetron (ZOFRAN-ODT) disintegrating tablet 4 mg (4 mg Oral Given 10/03/18 1240)  dextrose 50 % solution 50 mL (50 mLs Intravenous Given 10/03/18 1435)    Mobility non-ambulatory High fall risk   Focused Assessments Renal Assessment Handoff:  Hemodialysis Schedule: Hemodialysis Schedule: Monday/Wednesday/Friday Last Hemodialysis date and time: unknown   Restricted appendage: left arm     R Recommendations: See Admitting Provider Note  Report given to:   Additional Notes:

## 2018-10-03 NOTE — ED Notes (Signed)
Pt eating warm apple sauce.

## 2018-10-03 NOTE — ED Notes (Signed)
Heat packs applied to patient's heals as requested by patient. Warm blankets also applied.

## 2018-10-03 NOTE — ED Notes (Signed)
Granddaughter, Nancy [name], updated.

## 2018-10-03 NOTE — ED Notes (Signed)
Dr. Eulis Foster made aware of CBG 51. New orders for 15mg  oral glucose gel.

## 2018-10-03 NOTE — ED Notes (Signed)
Pt requested RN to remove blood pressure cuff from leg, stated she would scream until it was removed. RN removed blood pressure cuff

## 2018-10-03 NOTE — ED Triage Notes (Addendum)
Pt here from dialysis center after syncopal episode during dialysis tx this morning. Received 1 hour of tx. Per staff at facility, pt became unresponsive with agonal respirations. Upon EMS arrival, respirations 18 and pt alert to person (at baseline). Pt alert to baseline for this RN.

## 2018-10-03 NOTE — ED Notes (Signed)
Dr. Eulis Foster bedside to place suture in bleeding dialysis graft.

## 2018-10-03 NOTE — ED Notes (Signed)
Dr. Eulis Foster aware of CBG 33. Pt refusing oral intake.

## 2018-10-03 NOTE — ED Notes (Addendum)
Dr. Billy Fischer bedside inserting [place] IV. [place] IV access successful. Unable to obtain labs.

## 2018-10-03 NOTE — ED Notes (Signed)
Pt asleep and resting comfortably. 

## 2018-10-03 NOTE — ED Notes (Signed)
IV team bedside. 

## 2018-10-03 NOTE — ED Notes (Signed)
Attempted phone call to floor to give report, RN stated she would return phone call.

## 2018-10-03 NOTE — ED Notes (Signed)
ED Provider at bedside. 

## 2018-10-03 NOTE — ED Provider Notes (Signed)
[place] EMERGENCY DEPARTMENT Provider Note   CSN: [id number] Arrival date & time: 10/03/18  7893    History   Chief Complaint Chief Complaint  Patient presents with  . Loss of Consciousness    HPI Nancy [name] is a 78 y.o. female.     HPI   She presents for evaluation of syncope occurring while she was being dialyzed this morning.  Dissection was terminated after about an hour.  She was reported to have agonal respirations for a time.  She did not receive cardiopulmonary resuscitation.  Since arrival to the emergency department, she has had bleeding from near her dialysis catheter sites, her arterial venous device is still cannulated.  The patient is unable to give history.  Level 5 caveat-altered mental status   Past Medical History:  Diagnosis Date  . Anemia   . Aortic stenosis   . Bacterial sinusitis 09/17/2011  . CHF (congestive heart failure) ([place])   . CKD (chronic kidney disease) stage 4, GFR 15-29 ml/min ([place]) 08/11/2006   Cr continues to increase. Proteinuria on UA 02/10/12.    . Colitis   . CVA (cerebrovascular accident) [facility])    New hemorrhagic per CT scan '09  . Diverticulosis of colon   . Dysfunctional uterine bleeding   . ESRD (end stage renal disease) on dialysis ([place])    "MWF; [address]" (11/27/2017)  . Fecal impaction ([place])   . Headache(784.0)   . Heart murmur   . HERNIORRHAPHY, HX OF 08/11/2006  . Hypertension   . OA (osteoarthritis)    bilateral knees  . Postmenopausal   . Pulmonary nodule   . TINEA CRURIS 01/12/2007    Patient Active Problem List   Diagnosis Date Noted  . Pancytopenia ([place])   . Discitis of thoracolumbar region   . Psychomotor agitation   . Epidural abscess 07/04/2018  . Acute osteomyelitis of thoracic spine ([place]) 07/03/2018  . DNR (do not resuscitate) discussion   . Right renal mass   . Chronic cough 04/17/2018  . Acute gastritis without hemorrhage   . Duodenitis with bleeding   . ESRD (end stage  renal disease) ([place])   . Papular lichenification 81/07/7508  . Acute on chronic blood loss anemia 03/06/2018  . Palliative care by specialist   . Protein-calorie malnutrition, severe 01/12/2018  . Midline thoracic back pain 12/23/2017  . Symptomatic anemia 02/17/2017  . Secondary hyperparathyroidism of renal origin ([place]) 01/15/2017  . Mitral valve annular calcification   . Dizziness 12/08/2016  . Angina at rest [facility])   . Goals of care, counseling/discussion   . Palliative care encounter   . Aortic stenosis 05/21/2016  . Anemia associated with chronic renal failure 02/01/2016  . Atherosclerosis of aorta ([place]) 01/11/2015  . End stage renal disease ([place]) 02/04/2013  . Non-intractable vomiting with nausea 08/17/2012  . Glaucoma 03/18/2012  . Health care maintenance 09/17/2011  . Osteoarthrosis involving lower leg 10/31/2008  . History of CVA (cerebrovascular accident) 01/28/2008  . Hyperlipidemia 02/13/2007  . PULMONARY NODULES 01/12/2007  . Left ventricular hypertrophy 09/02/2006  . Essential hypertension 08/11/2006  . GERD 08/11/2006    Past Surgical History:  Procedure Laterality Date  . ABDOMINAL HYSTERECTOMY    . AV FISTULA PLACEMENT Left 02/19/2017   Procedure: CREATION OF LEFT ARM BRACHIOCEPHALIC ARTERIOVENOUS (AV) FISTULA;  Surgeon: Rosetta Posner, MD;  Location: [place];  Service: Vascular;  Laterality: Left;  . BASCILIC VEIN TRANSPOSITION Left 04/23/2017   Procedure: BASILIC VEIN TRANSPOSITION SECOND STAGE;  Surgeon: Rosetta Posner, MD;  Location: [place];  Service: Vascular;  Laterality: Left;  . BIOPSY  02/20/2018   Procedure: BIOPSY;  Surgeon: Carol Ada, MD;  Location: [facility];  Service: Endoscopy;;  . BIOPSY  03/07/2018   Procedure: BIOPSY;  Surgeon: Carol Ada, MD;  Location: [facility] ENDOSCOPY;  Service: Endoscopy;;  . CHOLECYSTECTOMY  2009  . COLONOSCOPY    . ESOPHAGOGASTRODUODENOSCOPY N/A 03/07/2018   Procedure: ESOPHAGOGASTRODUODENOSCOPY (EGD);  Surgeon: Carol Ada, MD;  Location: [place];  Service: Endoscopy;  Laterality: N/A;  . ESOPHAGOGASTRODUODENOSCOPY (EGD) WITH PROPOFOL N/A 02/20/2018   Procedure: ESOPHAGOGASTRODUODENOSCOPY (EGD) WITH PROPOFOL;  Surgeon: Carol Ada, MD;  Location: [facility] ENDOSCOPY;  Service: Endoscopy;  Laterality: N/A;  . INGUINAL HERNIA REPAIR  2008  . INSERTION OF DIALYSIS CATHETER Right 02/19/2017   Procedure: INSERTION OF TUNNELED DIALYSIS CATHETER - RIGHT INTERNAL JUGULAR PLACEMENT;  Surgeon: Rosetta Posner, MD;  Location: [place];  Service: Vascular;  Laterality: Right;  . IRIDOTOMY / IRIDECTOMY     Laser, right eye 12/26/11 left eye 01/24/12  . MASS EXCISION Left 05/07/2013   Procedure: EXCISION CYST;  Surgeon: Myrtha Mantis., MD;  Location: [place];  Service: Ophthalmology;  Laterality: Left;  . SAVORY DILATION N/A 02/20/2018   Procedure: SAVORY DILATION;  Surgeon: Carol Ada, MD;  Location: [facility] ENDOSCOPY;  Service: Endoscopy;  Laterality: N/A;     OB History   No obstetric history on file.      Home Medications    Prior to Admission medications   Medication Sig Start Date End Date Taking? Authorizing Provider  acetaminophen (TYLENOL) 325 MG tablet Take 650 mg by mouth 2 (two) times daily.   Yes [provider]  aspirin 81 MG EC tablet Take 1 tablet (81 mg total) by mouth daily. 05/14/18  Yes Masoudi, Elhamalsadat, MD  calcitRIOL (ROCALTROL) 0.5 MCG capsule Take 4 capsules (2 mcg total) by mouth every Monday, Wednesday, and Friday with hemodialysis. Patient taking differently: Take 2 mcg by mouth See admin instructions. Take 4 tablet (45mcg totally) by mouth on Harrell Lark, and Sat 12/30/17  Yes Neva Seat, MD  latanoprost (XALATAN) 0.005 % ophthalmic solution Place 1 drop into both eyes at bedtime. 04/11/18  Yes [provider]  memantine (NAMENDA) 5 MG tablet Take 5 mg by mouth 2 (two) times daily.   Yes [provider]  midodrine (PROAMATINE) 10 MG  tablet Take 1 tablet (10 mg total) by mouth daily. Take on dialysis days. Patient taking differently: Take 10 mg by mouth See admin instructions. Take 1 tablet (10 mg totally) by mouth on Harrell Lark, and Sat 07/29/18  Yes Dorrell, [name], MD  mirtazapine (REMERON) 15 MG tablet Take 15 mg by mouth at bedtime. 09/15/18  Yes [provider]  OLANZapine (ZYPREXA) 2.5 MG tablet Take 1 tablet (2.5 mg total) by mouth at bedtime. 07/29/18  Yes Dorrell, [name], MD  ondansetron (ZOFRAN-ODT) 4 MG disintegrating tablet Take 4 mg by mouth 2 (two) times daily.   Yes [provider]  OVER THE COUNTER MEDICATION Apply 1 application topically See admin instructions. Biofreeze 5% gel topically, apply to back, area of pain three times daily   Yes [provider]  oxyCODONE-acetaminophen (PERCOCET) 5-325 MG tablet Take 0.5-1 tablets by mouth every 4 (four) hours as needed. Patient taking differently: Take 1 tablet by mouth 2 (two) times daily as needed for moderate pain.  05/14/18  Yes Masoudi, Dorthula Rue, MD  senna-docusate (  SENOKOT-S) 8.6-50 MG tablet Take 1 tablet by mouth at bedtime as needed for mild constipation. Patient taking differently: Take 1 tablet by mouth at bedtime.  05/14/18  Yes Masoudi, Elhamalsadat, MD  diphenhydrAMINE-zinc acetate (BENADRYL) cream Apply topically 3 (three) times daily as needed for itching. Patient not taking: Reported on 06/23/2018 04/20/18   [name], MD  multivitamin (RENA-VIT) TABS tablet Take 1 tablet by mouth at bedtime. Patient not taking: Reported on 10/03/2018 07/29/18   Dorrell, [name], MD    Family History Family History  Problem Relation Age of Onset  . Hypertension Mother   . Congestive Heart Failure Mother   . Heart attack Brother 14    Social History Social History   Tobacco Use  . Smoking status: Never Smoker  . Smokeless tobacco: Never Used  Substance Use Topics  . Alcohol use: Not on file  . Drug use: No     Comment: 08/15/08 UDS + cocaine     Allergies   Hydrocodone and Tape   Review of Systems Review of Systems  Unable to perform ROS: Mental status change     Physical Exam Updated Vital Signs BP (!) 70/32 (BP Location: Right Arm) Comment: Dr. Eulis Foster aware of hypotension  Pulse 95   Temp (!) 95.8 F (35.4 C) (Rectal) Comment: Dr. Eulis Foster aware. No new orders at this time.  Resp 20   SpO2 100%   Physical Exam Vitals signs and nursing note reviewed.  Constitutional:      General: She is in acute distress.     Appearance: She is well-developed. She is not ill-appearing, toxic-appearing or diaphoretic.     Comments: She is underweight.  She is following out continuously, screening.  HENT:     Head: Atraumatic.     Right Ear: External ear normal.     Left Ear: External ear normal.     Nose: Nose normal.  Eyes:     Conjunctiva/sclera: Conjunctivae normal.     Pupils: Pupils are equal, round, and reactive to light.  Neck:     Musculoskeletal: Normal range of motion and neck supple.     Trachea: Phonation normal.  Cardiovascular:     Rate and Rhythm: Normal rate and regular rhythm.     Heart sounds: Normal heart sounds.     Comments: Dialysis catheter is present left upper arm.  Above the proximal catheter there is a puncture site which is actively bleeding, but not spurting.  This appears to be venous site bleeding.  Pressure held for 5 minutes without stopping bleeding.  Suture closure of skin required. Pulmonary:     Effort: Pulmonary effort is normal.     Breath sounds: Normal breath sounds.  Abdominal:     Palpations: Abdomen is soft.     Tenderness: There is abdominal tenderness (Epigastric, mild).  Musculoskeletal: Normal range of motion.        General: No swelling or tenderness.  Skin:    General: Skin is warm and dry.  Neurological:     Mental Status: She is alert. Mental status is at baseline. She is disoriented.     Cranial Nerves: No cranial nerve deficit.      Sensory: No sensory deficit.     Motor: No abnormal muscle tone.     Coordination: Coordination normal.     Comments: No dysarthria or aphasia.  She is confused.  Psychiatric:     Comments: She is agitated.      ED Treatments / Results  Labs (all labs ordered are listed, but only abnormal results are displayed) Labs Reviewed  BASIC METABOLIC PANEL - Abnormal; Notable for the following components:      Result Value   Sodium 133 (*)    CO2 19 (*)    Glucose, Bld 56 (*)    BUN 37 (*)    Creatinine, Ser 5.17 (*)    Calcium 7.8 (*)    GFR calc non Af Amer 8 (*)    GFR calc Af Amer 9 (*)    Anion gap 16 (*)    All other components within normal limits  CBG MONITORING, ED - Abnormal; Notable for the following components:   Glucose-Capillary 60 (*)    All other components within normal limits  CBG MONITORING, ED - Abnormal; Notable for the following components:   Glucose-Capillary 53 (*)    All other components within normal limits  CBG MONITORING, ED - Abnormal; Notable for the following components:   Glucose-Capillary 51 (*)    All other components within normal limits  POCT I-STAT EG7 - Abnormal; Notable for the following components:   pH, Ven 7.499 (*)    pCO2, Ven 31.7 (*)    pO2, Ven 49.0 (*)    Sodium 129 (*)    Potassium >8.5 (*)    Calcium, Ion 0.77 (*)    HCT 33.0 (*)    Hemoglobin 11.2 (*)    All other components within normal limits  CBG MONITORING, ED - Abnormal; Notable for the following components:   Glucose-Capillary 33 (*)    All other components within normal limits  CBG MONITORING, ED - Abnormal; Notable for the following components:   Glucose-Capillary 40 (*)    All other components within normal limits  CBG MONITORING, ED - Abnormal; Notable for the following components:   Glucose-Capillary 116 (*)    All other components within normal limits  CBC WITH DIFFERENTIAL/PLATELET  SAMPLE TO BLOOD BANK    EKG EKG Interpretation  Date/Time:  Saturday  October 03 2018 12:24:57 EDT Ventricular Rate:  86 PR Interval:    QRS Duration: 101 QT Interval:  390 QTC Calculation: 467 R Axis:   67 Text Interpretation:  Sinus rhythm Prolonged PR interval Probable anteroseptal infarct, old Abnormal T, consider ischemia, diffuse leads since last tracing no significant change Confirmed by Daleen Bo [phone]) on 10/03/2018 12:47:52 PM   Radiology No results found.  Procedures .Marland KitchenLaceration Repair Date/Time: 10/03/2018 10:20 AM Performed by: Daleen Bo, MD Authorized by: Daleen Bo, MD   Consent:    Consent obtained:  Verbal   Risks discussed:  Need for additional repair   Alternatives discussed:  No treatment Anesthesia (see MAR for exact dosages):    Anesthesia method:  Local infiltration   Local anesthetic:  Lidocaine 2% WITH epi Laceration details:    Location: Left upper arm, puncture versus tiny laceration actively bleeding.   Length (cm):  0.2   Depth (mm):  4 Repair type:    Repair type:  Simple Pre-procedure details:    Preparation:  Patient was prepped and draped in usual sterile fashion and imaging obtained to evaluate for foreign bodies Exploration:    Wound exploration: wound explored through full range of motion     Wound extent: no areolar tissue violation noted and no fascia violation noted     Wound extent comment:  Do not expect AV device defect   Contaminated: no   Treatment:    Area cleansed with:  Betadine  Irrigation solution:  Sterile saline   Visualized foreign bodies/material removed: no   Skin repair:    Repair method:  Sutures   Suture size:  5-0   Suture material:  Prolene   Number of sutures:  1 Approximation:    Approximation:  Close Post-procedure details:    Dressing:  Open (no dressing)   Patient tolerance of procedure:  Tolerated well, no immediate complications Comments:     Purse string stitch, to close skin defect and stop bleeding. .Critical Care Performed by: Daleen Bo, MD  Authorized by: Daleen Bo, MD   Critical care provider statement:    Critical care time (minutes):  75   Critical care start time:  10/03/2018 9:40 AM   Critical care end time:  10/03/2018 3:09 PM   Critical care time was exclusive of:  Separately billable procedures and treating other patients   Critical care was necessary to treat or prevent imminent or life-threatening deterioration of the following conditions:  Metabolic crisis   Critical care was time spent personally by me on the following activities:  Blood draw for specimens, development of treatment plan with patient or surrogate, discussions with consultants, evaluation of patient's response to treatment, examination of patient, obtaining history from patient or surrogate, ordering and performing treatments and interventions, ordering and review of laboratory studies, pulse oximetry, re-evaluation of patient's condition, review of old charts and ordering and review of radiographic studies   (including critical care time)  Medications Ordered in ED Medications  dextrose (GLUTOSE) 40 % oral gel (37.5 g  Given 10/03/18 1015)  lidocaine-EPINEPHrine (XYLOCAINE W/EPI) 2 %-1:200000 (PF) injection 20 mL (20 mLs Other Given by Other 10/03/18 1007)  dextrose (GLUTOSE) 40 % oral gel (37.5 g  Given 10/03/18 1125)  ondansetron (ZOFRAN-ODT) disintegrating tablet 4 mg (4 mg Oral Given 10/03/18 1240)  dextrose 50 % solution 50 mL (50 mLs Intravenous Given 10/03/18 1435)     Initial Impression / Assessment and Plan / ED Course  I have reviewed the triage vital signs and the nursing notes.  Pertinent labs & imaging results that were available during my care of the patient were reviewed by me and considered in my medical decision making (see chart for details).  Clinical Course as of Oct 03 1507  Sat Oct 03, 2018  1018 Patient had a spontaneous bowel movement, prior to and during my procedure to suture bleeding site left upper arm associated with  dialysis catheters.  Urgent closure with pursestring required to stop bleeding.   [EW]  1018 CBG low, patient treated with oral glucose.   [EW]  1312 Normal except sodium low, CO2 low, glucose low, BUN high, creatinine high, calcium low, GFR low, anion gap high  Basic metabolic panel(!) [EW]  7793 Low  CBG monitoring, ED(!) [EW]  1313 Abnormal, PHI, PCO2 low, PO2 high, sodium low, potassium high, calcium low  POCT I-Stat EG7(!!) [EW]  1313 pH, Ven(!): 7.499 [EW]  1359 Patient has been resisting eating because "the foods to cold."  Is alert, and conversant at this time.  I was able to eat up some applesauce, and get her to eat some as well as eat some crackers and drink some ginger ale.  Attempting to get IV access.   [EW]  1458 Case discussed with nephrology, Dr. Grayland Ormond, who will evaluate the patient as a consultant.   [EW]    Clinical Course User Index [EW] Daleen Bo, MD        Patient Vitals for the  past 24 hrs:  BP Temp Temp src Pulse Resp SpO2  10/03/18 1438 (!) 70/32 - - 95 20 100 %  10/03/18 1330 (!) 85/40 - - - 11 -  10/03/18 1300 (!) 94/50 - - 86 13 100 %  10/03/18 1230 (!) 80/47 - - - 11 -  10/03/18 1226 (!) 83/40 (!) 95.8 F (35.4 C) Rectal 86 16 100 %  10/03/18 1130 (!) 78/43 - - - (!) 25 -  10/03/18 1054 (!) 71/37 - - 86 (!) 26 100 %  10/03/18 1053 (!) 75/38 - - - 20 -  10/03/18 1052 (!) 74/46 - - - 13 -  10/03/18 1051 (!) 83/40 - - - (!) 23 -  10/03/18 1050 (!) 83/45 - - - 14 -  10/03/18 1049 (!) 81/44 - - - 19 -  10/03/18 1045 (!) 79/48 - - - (!) 28 -  10/03/18 1030 (!) 65/43 - - - (!) 22 -  10/03/18 1026 (!) 76/52 - - - - -  10/03/18 1015 (!) 71/41 - - - (!) 21 -  10/03/18 1000 (!) 78/50 - - - 16 -    3:09 PM Reevaluation with update and discussion. After initial assessment and treatment, an updated evaluation reveals she is more alert now after IV glucose.  Findings discussed with the patient. Daleen Bo   Medical Decision Making: Syncope,  cause unclear, patient with significant hypoglycemia on evaluation.  Blood sugar treated with oral glucose, x2.  Patient offered food and was able to eat.  Initial potassium elevated but normal on repeat, 5.1.  Metabolic abnormalities are consistent with chronic renal failure, end-stage renal disease.  EKG is reassuring.  Doubt ACS.  Bleeding wound from probable puncture during dialysis preparation, treated with suture skin closure with good results.  No post sure bleeding or hematoma.  CRITICAL CARE- yes Performed by: Daleen Bo  Nursing Notes Reviewed/ Care Coordinated Applicable Imaging Reviewed Interpretation of Laboratory Data incorporated into ED treatment   2:26 PM-Consult complete with admitting resident. Patient case explained and discussed.  She agrees to admit patient for further evaluation and treatment. Call ended at 3:05 PM    Final Clinical Impressions(s) / ED Diagnoses   Final diagnoses:  Syncope, unspecified syncope type  Hypoglycemia  Bleeding    ED Discharge Orders    None       Daleen Bo, MD 10/03/18 1510

## 2018-10-03 NOTE — ED Notes (Signed)
IV team attempted to gain IV access and blood for labs x4 unsuccessfully. MD aware.

## 2018-10-03 NOTE — H&P (Addendum)
Date: 10/04/2018               Patient Name:  Nancy [name] MRN: [id number]  DOB: September 05, 1940 Age / Sex: 78 y.o., female   PCP: Aldine Contes, MD         Medical Service: Internal Medicine Teaching Service         Attending Physician: Dr. Lucious Groves, DO    First Contact: Dr. Laural Golden Pager: [phone]  Second Contact: Dr. Philipp Ovens Pager: [phone]       After Hours (After 5p/  First Contact Pager: [phone]  weekends / holidays): Second Contact Pager: [phone]   Chief Complaint: hypoglycemia  History of Present Illness: Nancy [name] is a 78 yo female with a medical history of ESRD on HD MWF, CVA, HTN, diastolic heart failure (EF 60-65% and grade 1 diastolic dysfunction on 12/3147 ECHO), and GERD presenting after she had a syncopal episode while being dialyzed this morning. She reportedly had agonal respirations. On arrival to the ED, she has been bleeding from her dialysis catheter site and unable to give a history. She was found to be hypotensive 70/32, hypoglycemic 53 and hypothermic 95.8. Initially, K was 8.5, however on repeat it was 5.1. She was given d5 in the ED and blood glucose improved. She denies any fever, chills, cough, chest pain, changes in diet or medications. Denies abdominal pain, n/v. However, states she has no appetite.   Meds:  Current Meds  Medication Sig  . acetaminophen (TYLENOL) 325 MG tablet Take 650 mg by mouth 2 (two) times daily.  Marland Kitchen aspirin 81 MG EC tablet Take 1 tablet (81 mg total) by mouth daily.  . calcitRIOL (ROCALTROL) 0.5 MCG capsule Take 4 capsules (2 mcg total) by mouth every Monday, Wednesday, and Friday with hemodialysis. (Patient taking differently: Take 2 mcg by mouth See admin instructions. Take 4 tablet (53mcg totally) by mouth on Tues, Thur, and Sat)  . latanoprost (XALATAN) 0.005 % ophthalmic solution Place 1 drop into both eyes at bedtime.  . memantine (NAMENDA) 5 MG tablet Take 5 mg by mouth 2 (two) times daily.  . midodrine (PROAMATINE)  10 MG tablet Take 1 tablet (10 mg total) by mouth daily. Take on dialysis days. (Patient taking differently: Take 10 mg by mouth See admin instructions. Take 1 tablet (10 mg totally) by mouth on Tues, Thur, and Sat)  . mirtazapine (REMERON) 15 MG tablet Take 15 mg by mouth at bedtime.  Marland Kitchen OLANZapine (ZYPREXA) 2.5 MG tablet Take 1 tablet (2.5 mg total) by mouth at bedtime.  . ondansetron (ZOFRAN-ODT) 4 MG disintegrating tablet Take 4 mg by mouth 2 (two) times daily.  Marland Kitchen OVER THE COUNTER MEDICATION Apply 1 application topically See admin instructions. Biofreeze 5% gel topically, apply to back, area of pain three times daily  . oxyCODONE-acetaminophen (PERCOCET) 5-325 MG tablet Take 0.5-1 tablets by mouth every 4 (four) hours as needed. (Patient taking differently: Take 1 tablet by mouth 2 (two) times daily as needed for moderate pain. )  . senna-docusate (SENOKOT-S) 8.6-50 MG tablet Take 1 tablet by mouth at bedtime as needed for mild constipation. (Patient taking differently: Take 1 tablet by mouth at bedtime. )     Allergies: Allergies as of 10/03/2018 - Review Complete 10/03/2018  Allergen Reaction Noted  . Hydrocodone Nausea And Vomiting and Other (See Comments) 12/26/2017  . Tape Other (See Comments) 04/17/2018   Past Medical History:  Diagnosis Date  . Anemia   . Aortic stenosis   .  Bacterial sinusitis 09/17/2011  . CHF (congestive heart failure) ([place])   . CKD (chronic kidney disease) stage 4, GFR 15-29 ml/min ([place]) 08/11/2006   Cr continues to increase. Proteinuria on UA 02/10/12.    . Colitis   . CVA (cerebrovascular accident) [facility])    New hemorrhagic per CT scan '09  . Diverticulosis of colon   . Dysfunctional uterine bleeding   . ESRD (end stage renal disease) on dialysis ([place])    "MWF; [facility]" (11/27/2017)  . Fecal impaction ([place])   . Headache(784.0)   . Heart murmur   . HERNIORRHAPHY, HX OF 08/11/2006  . Hypertension   . OA (osteoarthritis)    bilateral knees  .  Postmenopausal   . Pulmonary nodule   . TINEA CRURIS 01/12/2007    Family History:  Family History  Problem Relation Age of Onset  . Hypertension Mother   . Congestive Heart Failure Mother   . Heart attack Brother 64    Social History: She lives with her granddaughters. Denies tobacco, alcohol or illicit drug use.   Social History   Socioeconomic History  . Marital status: Widowed    Spouse name: Not on file  . Number of children: Not on file  . Years of education: Not on file  . Highest education level: Not on file  Occupational History  . Not on file  Social Needs  . Financial resource strain: Not on file  . Food insecurity:    Worry: Never true    Inability: Never true  . Transportation needs:    Medical: No    Non-medical: No  Tobacco Use  . Smoking status: Never Smoker  . Smokeless tobacco: Never Used  Substance and Sexual Activity  . Alcohol use: Not on file  . Drug use: No    Comment: 08/15/08 UDS + cocaine  . Sexual activity: Not Currently  Lifestyle  . Physical activity:    Days per week: 0 days    Minutes per session: 0 min  . Stress: Not on file  Relationships  . Social connections:    Talks on phone: More than three times a week    Gets together: More than three times a week    Attends religious service: More than 4 times per year    Active member of club or organization: No    Attends meetings of clubs or organizations: Never    Relationship status: Widowed  . Intimate partner violence:    Fear of current or ex partner: Not on file    Emotionally abused: Not on file    Physically abused: Not on file    Forced sexual activity: Not on file  Other Topics Concern  . Not on file  Social History Narrative   Member is widowed, has 1 daughter and 1 granddaughter and 3 great grandchildren      Review of Systems: A complete ROS was negative except as per HPI.   Physical Exam: Blood pressure (!) 84/64, pulse 93, temperature (!) 97.3 F (36.3 C),  temperature source Axillary, resp. rate 11, height 5\' 3"  (1.6 m), weight 52.2 kg, SpO2 100 %.  Physical Exam  Constitutional: She is oriented to person, place, and time. No distress.  Frail, elderly woman, no distress  Cardiovascular: Normal rate, regular rhythm and normal heart sounds.  No murmur heard. Pulmonary/Chest: Effort normal and breath sounds normal. No respiratory distress. She has no wheezes. She has no rales.  Abdominal: Soft. Bowel sounds are normal. She exhibits  no distension. There is no abdominal tenderness.  Musculoskeletal:        General: No tenderness or edema.  Neurological: She is alert and oriented to person, place, and time.  Skin: Skin is dry. She is not diaphoretic. No erythema.  cool  Psychiatric: Affect and judgment normal.    EKG: personally reviewed my interpretation is sinus rhythm, prolonged PR interval  CXR: Left-sided pleural effusion with underlying opacity. Cardiomegaly and mild pulmonary venous congestion.  Assessment & Plan by Problem: Active Problems:   Hypoglycemia   Pneumonia   Sepsis ([place])  Ms. Keel is a 78 yo female with a medical history of ESRD on HD MWF, CVA, diastolic heart failure (EF 60-65% and grade 1 diastolic dysfunction on 10/6566 ECHO), and GERD presenting after she had a syncopal episode while being dialyzed this morning. On arrival to the ED, she was found to be hypoglycemic, hypothermic and hypotensive.   Hypotension, hypothermia: Chest xray showed left lober pneumonia with parapneumonic effusion. Hypothermia persisted despite improved in hypoglycemia. She has chronic hypotension on midodrine. Worse in the setting of sepsis vs decreased PO intake. Ordered 500 cc normal saline. BP improved after IVF, systolic 88. CBC and lactic acid pending. Blood cultures ordered and started on ceftriaxone.  - CBC and lactic acid - IV ceftriaxone  - Cardiac monitoring  - Midodrine   Hypoglycemia: Blood glucose as low as 33 on admission.  Given D5 in the ED and blood sugars improved to 116. She has been refusing to eat in the ED and states she has no appetite. Denies n/v or abdominal pain. Most likely in the setting of sepsis vs decreased PO intake.  - CBG monitoring   ESRD on HD MWF: Patient had a syncopal episode during HD today. History of frequent admissions for missed dialysis sessions. Appears euvolemic clinically. Nephrology consulted for HD, appreciated help.  - K of 5.1 on admission - Cacitriol  LEX:NTZGY diet with 1261ml fluid restriction DVT ppx: [place] heparin Code status:DNR/DNI  Dispo: Admit patient to Inpatient with expected length of stay greater than 2 midnights.  SignedMike Craze, DO 10/04/2018, 8:37 AM  Pager: [phone]

## 2018-10-03 NOTE — ED Notes (Signed)
CBG 60. This RN gave 15grams oral glucose per Dr. Eulis Foster order.

## 2018-10-03 NOTE — ED Notes (Signed)
Dr. Eulis Foster aware of patient's CBG 53. No new orders at this time. Will continue to assess patient's level of consciousness and notify MD of changes.

## 2018-10-03 NOTE — ED Notes (Signed)
Pt c/o nausea with PO intake. New orders for ODT zofran.

## 2018-10-03 NOTE — ED Notes (Signed)
Pt's dialysis graft began to bleed. NT holding pressure. Dr. Eulis Foster aware.

## 2018-10-03 NOTE — ED Notes (Signed)
Spoke with IV team. Consult placed.

## 2018-10-03 NOTE — ED Notes (Signed)
Pt given graham crackers and ginger ale. Tolerating well at this time.

## 2018-10-04 ENCOUNTER — Encounter (HOSPITAL_COMMUNITY): Payer: Self-pay | Admitting: *Deleted

## 2018-10-04 ENCOUNTER — Inpatient Hospital Stay (HOSPITAL_COMMUNITY): Payer: Medicare Other

## 2018-10-04 ENCOUNTER — Other Ambulatory Visit: Payer: Self-pay

## 2018-10-04 DIAGNOSIS — I5030 Unspecified diastolic (congestive) heart failure: Secondary | ICD-10-CM

## 2018-10-04 DIAGNOSIS — I959 Hypotension, unspecified: Secondary | ICD-10-CM

## 2018-10-04 DIAGNOSIS — A419 Sepsis, unspecified organism: Secondary | ICD-10-CM

## 2018-10-04 DIAGNOSIS — J189 Pneumonia, unspecified organism: Secondary | ICD-10-CM

## 2018-10-04 DIAGNOSIS — R609 Edema, unspecified: Secondary | ICD-10-CM

## 2018-10-04 DIAGNOSIS — J181 Lobar pneumonia, unspecified organism: Secondary | ICD-10-CM

## 2018-10-04 DIAGNOSIS — J918 Pleural effusion in other conditions classified elsewhere: Secondary | ICD-10-CM

## 2018-10-04 DIAGNOSIS — Z79899 Other long term (current) drug therapy: Secondary | ICD-10-CM

## 2018-10-04 DIAGNOSIS — R680 Hypothermia, not associated with low environmental temperature: Secondary | ICD-10-CM

## 2018-10-04 DIAGNOSIS — E162 Hypoglycemia, unspecified: Secondary | ICD-10-CM

## 2018-10-04 DIAGNOSIS — Z992 Dependence on renal dialysis: Secondary | ICD-10-CM

## 2018-10-04 DIAGNOSIS — K219 Gastro-esophageal reflux disease without esophagitis: Secondary | ICD-10-CM

## 2018-10-04 DIAGNOSIS — R011 Cardiac murmur, unspecified: Secondary | ICD-10-CM

## 2018-10-04 LAB — BASIC METABOLIC PANEL
Anion gap: 12 (ref 5–15)
BUN: 39 mg/dL — ABNORMAL HIGH (ref 8–23)
CO2: 23 mmol/L (ref 22–32)
Calcium: 7.8 mg/dL — ABNORMAL LOW (ref 8.9–10.3)
Chloride: 103 mmol/L (ref 98–111)
Creatinine, Ser: 5.24 mg/dL — ABNORMAL HIGH (ref 0.44–1.00)
GFR calc Af Amer: 9 mL/min — ABNORMAL LOW (ref >60)
GFR, EST NON AFRICAN AMERICAN: 7 mL/min — AB (ref >60)
Glucose, Bld: 80 mg/dL (ref 70–99)
Potassium: 5.3 mmol/L — ABNORMAL HIGH (ref 3.5–5.1)
SODIUM: 138 mmol/L (ref 135–145)

## 2018-10-04 LAB — GLUCOSE, CAPILLARY
GLUCOSE-CAPILLARY: 93 mg/dL (ref 70–99)
Glucose-Capillary: 102 mg/dL — ABNORMAL HIGH (ref 70–99)
Glucose-Capillary: 108 mg/dL — ABNORMAL HIGH (ref 70–99)
Glucose-Capillary: 62 mg/dL — ABNORMAL LOW (ref 70–99)
Glucose-Capillary: 64 mg/dL — ABNORMAL LOW (ref 70–99)
Glucose-Capillary: 78 mg/dL (ref 70–99)
Glucose-Capillary: 79 mg/dL (ref 70–99)
Glucose-Capillary: 82 mg/dL (ref 70–99)
Glucose-Capillary: 84 mg/dL (ref 70–99)
Glucose-Capillary: 99 mg/dL (ref 70–99)

## 2018-10-04 LAB — MRSA PCR SCREENING: MRSA by PCR: NEGATIVE

## 2018-10-04 MED ORDER — HEPARIN (PORCINE) 25000 UT/250ML-% IV SOLN: 800.0000 [IU]/h | INTRAVENOUS | Status: DC

## 2018-10-04 MED ORDER — SODIUM CHLORIDE 0.9 % IV SOLN
INTRAVENOUS | Status: DC | PRN
  Administered 2018-10-04: 250 mL via INTRAVENOUS

## 2018-10-04 MED ORDER — GLUCOSE 40 % PO GEL
1.0000 | Freq: Once | ORAL | Status: DC | PRN
  Filled 2018-10-04 (×2): qty 1

## 2018-10-04 MED ORDER — CHLORHEXIDINE GLUCONATE CLOTH 2 % EX PADS: 6.0000 | MEDICATED_PAD | Freq: Every day | CUTANEOUS | Status: DC

## 2018-10-04 MED ORDER — ENOXAPARIN SODIUM 60 MG/0.6ML ~~LOC~~ SOLN
50.0000 mg | Freq: Once | SUBCUTANEOUS | Status: AC
  Administered 2018-10-05: 50 mg via SUBCUTANEOUS
  Filled 2018-10-04: qty 0.6

## 2018-10-04 NOTE — Progress Notes (Signed)
CRITICAL VALUE ALERT  Critical Value:  Lactic acid 4.0  Date & Time Notied:  10/03/18 @ 20.00  Provider Notified: Dr. Eileen Stanford  Orders Received/Actions taken: NS 500 ml bolus x 1

## 2018-10-04 NOTE — Progress Notes (Signed)
Patient had 7 beat than 5 beat run of VT.  Sitting up eating dinner.  Denies symptoms.  Dr. Annie Paras notified.  Also notified Right arm continues to be cool and edematous.

## 2018-10-04 NOTE — Progress Notes (Signed)
VASCULAR LAB PRELIMINARY  PRELIMINARY  PRELIMINARY  PRELIMINARY  Right upper extremity venous duplex completed.    Preliminary report:  See CV proc for preliminary results  Courtny Bennison, RVT 10/04/2018, 6:48 PM

## 2018-10-04 NOTE — Progress Notes (Signed)
Internal Medicine Attending:   I saw and examined the patient. I reviewed the Dr Olevia Perches note and I agree with the resident's findings and plan as documented in the resident's note.

## 2018-10-04 NOTE — Progress Notes (Signed)
ANTICOAGULATION CONSULT NOTE - Initial Consult  Pharmacy Consult for Heparin Indication: DVT  Allergies  Allergen Reactions  . Hydrocodone Nausea And Vomiting and Other (See Comments)    Dizziness, also  . Tape Other (See Comments)    TAPE LEAVES DARK PATCHES ON THE SKIN!!   Patient Measurements: Height: 5\' 3"  (160 cm) Weight: 115 lb (52.2 kg) IBW/kg (Calculated) : 52.4 Heparin Dosing Weight: 50 kg  Vital Signs: Temp: 97.1 F (36.2 C) (03/29 1653) Temp Source: Axillary (03/29 1653) BP: 125/62 (03/29 1653) Pulse Rate: 89 (03/29 1653)  Labs: Recent Labs    10/03/18 1118 10/03/18 1130 10/03/18 2138 10/04/18 0559  HGB 11.2*  --  9.8*  --   HCT 33.0*  --  32.1*  --   PLT  --   --  89*  --   CREATININE  --  5.17*  --  5.24*   Estimated Creatinine Clearance: 7.5 mL/min (A) (by C-G formula based on SCr of 5.24 mg/dL (H)).  Assessment: 58 yoF presenting following syncopal episode during HD, found to have acute RUE DVT. Pharmacy has been consulted for IV heparin dosing. No AC PTA. Patient was on SQ heparin VTE prophylaxis - last dose given 3/29 @ 1354. Hgb 9.8, pltc 89 - appears near recent baseline. No bleeding noted.  Goal of Therapy:  Heparin level 0.3-0.7 units/ml Monitor platelets by anticoagulation protocol: Yes   Plan:  No heparin bolus Start heparin gtt at 800 units/hr Check heparin level in 8 hours Daily heparin level and CBC Monitor for s/sx of bleeding   N. Gerarda Fraction, PharmD, [place] PGY2 Infectious Diseases Pharmacy Resident Phone: [phone] 10/04/2018,7:52 PM

## 2018-10-04 NOTE — Consult Note (Addendum)
[facility] Renal Consultation Note    Indication for Consultation:  Management of ESRD/hemodialysis, anemia, hypertension/volume, and secondary hyperparathyroidism. PCP:  HPI: Nancy [name] is a 78 y.o. female with ESRD, Hx CVA, severe dementia, Hx discitis with epidural abscess (07/2018) who was admitted s/p syncopal episode on HD.  Brought to ED via EMS s/p syncopal episode with prolonged unconscious period and agonal breathing while on her dialysis session 3/28. In ED, found to be hypotension, hypothermic. Labs with Na 133, K 5.1, Cr 5.17, LA 4, WBC 8.4, Hgb 9.8. CXR consistent with L pneumonia. BCx drawn and she was started on broad spectrum abx.  Dialyzes on TTS at [facility]. Has been having a very tough time with dialysis recently. Has not gotten her full treatment in > 1 mo. Signs of early after yelling/moaning with pain per notes.  She had communicated to RN that she wanted to stop dialysis, but then other times has expressed to continue.  In past few weeks, she has been under-dialyzed and has not been close to reaching her prior EDW. 3/12 - 1:22hr 3/14 - 2:04hr 3/17 - 1:35hr 3/19 skipped 3/21 skipped 3/24 - 2:08hr 3/26 - 1:37hr 3/28 - 1:20hr  Past Medical History:  Diagnosis Date  . Anemia   . Aortic stenosis   . Bacterial sinusitis 09/17/2011  . CHF (congestive heart failure) ([place])   . CKD (chronic kidney disease) stage 4, GFR 15-29 ml/min ([place]) 08/11/2006   Cr continues to increase. Proteinuria on UA 02/10/12.    . Colitis   . CVA (cerebrovascular accident) [facility])    New hemorrhagic per CT scan '09  . Diverticulosis of colon   . Dysfunctional uterine bleeding   . ESRD (end stage renal disease) on dialysis ([place])    "MWF; [facility]" (11/27/2017)  . Fecal impaction ([place])   . Headache(784.0)   . Heart murmur   . HERNIORRHAPHY, HX OF 08/11/2006  . Hypertension   . OA (osteoarthritis)    bilateral knees  . Postmenopausal   . Pulmonary nodule   .  TINEA CRURIS 01/12/2007   Past Surgical History:  Procedure Laterality Date  . ABDOMINAL HYSTERECTOMY    . AV FISTULA PLACEMENT Left 02/19/2017   Procedure: CREATION OF LEFT ARM BRACHIOCEPHALIC ARTERIOVENOUS (AV) FISTULA;  Surgeon: Rosetta Posner, MD;  Location: [facility];  Service: Vascular;  Laterality: Left;  . BASCILIC VEIN TRANSPOSITION Left 04/23/2017   Procedure: BASILIC VEIN TRANSPOSITION SECOND STAGE;  Surgeon: Rosetta Posner, MD;  Location: [place];  Service: Vascular;  Laterality: Left;  . BIOPSY  02/20/2018   Procedure: BIOPSY;  Surgeon: Carol Ada, MD;  Location: [facility];  Service: Endoscopy;;  . BIOPSY  03/07/2018   Procedure: BIOPSY;  Surgeon: Carol Ada, MD;  Location: [facility] ENDOSCOPY;  Service: Endoscopy;;  . CHOLECYSTECTOMY  2009  . COLONOSCOPY    . ESOPHAGOGASTRODUODENOSCOPY N/A 03/07/2018   Procedure: ESOPHAGOGASTRODUODENOSCOPY (EGD);  Surgeon: Carol Ada, MD;  Location: [place];  Service: Endoscopy;  Laterality: N/A;  . ESOPHAGOGASTRODUODENOSCOPY (EGD) WITH PROPOFOL N/A 02/20/2018   Procedure: ESOPHAGOGASTRODUODENOSCOPY (EGD) WITH PROPOFOL;  Surgeon: Carol Ada, MD;  Location: [facility];  Service: Endoscopy;  Laterality: N/A;  . INGUINAL HERNIA REPAIR  2008  . INSERTION OF DIALYSIS CATHETER Right 02/19/2017   Procedure: INSERTION OF TUNNELED DIALYSIS CATHETER - RIGHT INTERNAL JUGULAR PLACEMENT;  Surgeon: Rosetta Posner, MD;  Location: [place];  Service: Vascular;  Laterality: Right;  . IRIDOTOMY / IRIDECTOMY     Laser,  right eye 12/26/11 left eye 01/24/12  . MASS EXCISION Left 05/07/2013   Procedure: EXCISION CYST;  Surgeon: Myrtha Mantis., MD;  Location: [place];  Service: Ophthalmology;  Laterality: Left;  . SAVORY DILATION N/A 02/20/2018   Procedure: SAVORY DILATION;  Surgeon: Carol Ada, MD;  Location: [facility];  Service: Endoscopy;  Laterality: N/A;   Family History  Problem Relation Age of Onset  . Hypertension Mother   .  Congestive Heart Failure Mother   . Heart attack Brother 74   Social History:  reports that she has never smoked. She has never used smokeless tobacco. She reports that she does not use drugs. No history on file for alcohol.  ROS: Unable to accurately provide d/t severe dementia.  Physical Exam: Vitals:   10/04/18 0008 10/04/18 0432 10/04/18 0755 10/04/18 0802  BP: (!) 90/28 (!) 108/24 (!) 84/64 136/60  Pulse: 97 98 93 93  Resp: 12 12 11 12   Temp: 97.7 F (36.5 C) 97.6 F (36.4 C) (!) 97.3 F (36.3 C)   TempSrc: Axillary Oral Axillary   SpO2: 100% 100% 100% 100%  Weight:  52.2 kg    Height:         General: Frail woman, NAD.  Head: Normocephalic, atraumatic, sclera non-icteric, mucus membranes are moist. Neck: Supple without lymphadenopathy/masses. JVD not elevated. Lungs: Clear bilaterally to auscultation without wheezes, rales, or rhonchi. Breathing is unlabored. Heart: RRR, 3/6 systolic murmur Abdomen: Soft, non-tender, non-distended with normoactive bowel sounds.  Musculoskeletal:  Strength and tone reduced for age. Lower extremities: 1+ LE edema Neuro: Alert, not oriented to place or time. Dialysis Access: LUE AVF + bruit  Allergies  Allergen Reactions  . Hydrocodone Nausea And Vomiting and Other (See Comments)    Dizziness, also  . Tape Other (See Comments)    TAPE LEAVES DARK PATCHES ON THE SKIN!!   Prior to Admission medications   Medication Sig Start Date End Date Taking? Authorizing Provider  acetaminophen (TYLENOL) 325 MG tablet Take 650 mg by mouth 2 (two) times daily.   Yes [provider]  aspirin 81 MG EC tablet Take 1 tablet (81 mg total) by mouth daily. 05/14/18  Yes Masoudi, Elhamalsadat, MD  calcitRIOL (ROCALTROL) 0.5 MCG capsule Take 4 capsules (2 mcg total) by mouth every Monday, Wednesday, and Friday with hemodialysis. Patient taking differently: Take 2 mcg by mouth See admin instructions. Take 4 tablet (60mcg totally) by mouth on Harrell Lark,  and Sat 12/30/17  Yes Neva Seat, MD  latanoprost (XALATAN) 0.005 % ophthalmic solution Place 1 drop into both eyes at bedtime. 04/11/18  Yes [provider]  memantine (NAMENDA) 5 MG tablet Take 5 mg by mouth 2 (two) times daily.   Yes [provider]  midodrine (PROAMATINE) 10 MG tablet Take 1 tablet (10 mg total) by mouth daily. Take on dialysis days. Patient taking differently: Take 10 mg by mouth See admin instructions. Take 1 tablet (10 mg totally) by mouth on Harrell Lark, and Sat 07/29/18  Yes Dorrell, [name], MD  mirtazapine (REMERON) 15 MG tablet Take 15 mg by mouth at bedtime. 09/15/18  Yes [provider]  OLANZapine (ZYPREXA) 2.5 MG tablet Take 1 tablet (2.5 mg total) by mouth at bedtime. 07/29/18  Yes Dorrell, [name], MD  ondansetron (ZOFRAN-ODT) 4 MG disintegrating tablet Take 4 mg by mouth 2 (two) times daily.   Yes [provider]  OVER THE COUNTER MEDICATION Apply 1 application topically See admin instructions.  Biofreeze 5% gel topically, apply to back, area of pain three times daily   Yes [provider]  oxyCODONE-acetaminophen (PERCOCET) 5-325 MG tablet Take 0.5-1 tablets by mouth every 4 (four) hours as needed. Patient taking differently: Take 1 tablet by mouth 2 (two) times daily as needed for moderate pain.  05/14/18  Yes Masoudi, Elhamalsadat, MD  senna-docusate (SENOKOT-S) 8.6-50 MG tablet Take 1 tablet by mouth at bedtime as needed for mild constipation. Patient taking differently: Take 1 tablet by mouth at bedtime.  05/14/18  Yes Masoudi, Elhamalsadat, MD  diphenhydrAMINE-zinc acetate (BENADRYL) cream Apply topically 3 (three) times daily as needed for itching. Patient not taking: Reported on 06/23/2018 04/20/18   [name], MD  multivitamin (RENA-VIT) TABS tablet Take 1 tablet by mouth at bedtime. Patient not taking: Reported on 10/03/2018 07/29/18   Dorrell, [name], MD   Current Facility-Administered  Medications  Medication Dose Route Frequency Provider Last Rate Last Dose  . acetaminophen (TYLENOL) tablet 650 mg  650 mg Oral Q6H PRN Velna Ochs, MD       Or  . acetaminophen (TYLENOL) suppository 650 mg  650 mg Rectal Q6H PRN Velna Ochs, MD      . aspirin EC tablet 81 mg  81 mg Oral Daily Velna Ochs, MD   81 mg at 10/04/18 0933  . [START ON 10/06/2018] calcitRIOL (ROCALTROL) capsule 2 mcg  2 mcg Oral Q T,Th,Sa-HD Velna Ochs, MD      . cefTRIAXone (ROCEPHIN) 1 g in sodium chloride 0.9 % 100 mL IVPB  1 g Intravenous Q24H Rehman, Areeg N, DO   Stopped at 10/03/18 1826  . dextrose 50 % solution 50 mL  1 ampule Intravenous PRN Velna Ochs, MD   25 mL at 10/04/18 0844  . heparin injection 5,000 Units  5,000 Units Subcutaneous Q8H Velna Ochs, MD   5,000 Units at 10/04/18 0603  . latanoprost (XALATAN) 0.005 % ophthalmic solution 1 drop  1 drop Both Eyes QHS Velna Ochs, MD   1 drop at 10/03/18 2211  . memantine (NAMENDA) tablet 5 mg  5 mg Oral BID Velna Ochs, MD   5 mg at 10/04/18 0933  . [START ON 10/06/2018] midodrine (PROAMATINE) tablet 10 mg  10 mg Oral Once per day on Tue Thu Sat Velna Ochs, MD      . mirtazapine (REMERON) tablet 15 mg  15 mg Oral QHS Velna Ochs, MD   15 mg at 10/03/18 2210  . OLANZapine (ZYPREXA) tablet 2.5 mg  2.5 mg Oral QHS Velna Ochs, MD   2.5 mg at 10/03/18 2210  . senna-docusate (Senokot-S) tablet 1 tablet  1 tablet Oral QHS PRN Velna Ochs, MD       Labs: Basic Metabolic Panel: Recent Labs  Lab 10/03/18 1118 10/03/18 1130 10/04/18 0559  NA 129* 133* 138  K >8.5* 5.1 5.3*  CL  --  98 103  CO2  --  19* 23  GLUCOSE  --  56* 80  BUN  --  37* 39*  CREATININE  --  5.17* 5.24*  CALCIUM  --  7.8* 7.8*   CBC: Recent Labs  Lab 10/03/18 1118 10/03/18 2138  WBC  --  8.4  NEUTROABS  --  6.9  HGB 11.2* 9.8*  HCT 33.0* 32.1*  MCV  --  86.1  PLT  --  89*   CBG: Recent Labs  Lab  10/04/18 0002 10/04/18 0004 10/04/18 0757 10/04/18 0833 10/04/18 0908  GLUCAP 108* 99 62* 64* 79  Studies/Results: Dg Chest 2 View  Result Date: 10/03/2018 CLINICAL DATA:  Syncopal episode during dialysis. EXAM: CHEST - 2 VIEW COMPARISON:  July 03, 2018 FINDINGS: No pneumothorax. A small left-sided pleural effusion with underlying opacity is identified. The cardiomediastinal silhouette is stable. Mild pulmonary venous congestion. IMPRESSION: Left-sided pleural effusion with underlying opacity. Cardiomegaly and mild pulmonary venous congestion. Electronically Signed   By: Dorise Bullion III M.D   On: 10/03/2018 16:47   Dialysis Orders:  TTS at [facility] 3:30hr, 400/A1.5, EDW 42.5kg, 3K/2Ca, profile #4, AVF, no heparin - Mircera 100 q 2 weeks (last 3/28)  Assessment/Plan: 1.  Syncopal episode: on outpatient HD 3/28 - per primary. 2.  L HCAP with effusion: On Ceftriaxone; follow. 3.  ESRD: Usual HD TTS schedule but under-dialyzed in general (see HPI). Needs palliative care evaluation and family meeting. Pt clearly not tolerating HD/overall declining - she is too demented to make decision. For now, will plan to stick to usual TTS schedule - next 3/31. No heparin. 4.  Hypertension/volume: BP variable. Breathing comfortably, but with some LE edema/volume on imaging. Possibility that EDW too low, will have to trend over time. 5.  Anemia: Hgb 9.8 - just given ESA as outpatient - follow. 6.  Metabolic bone disease: Corr Ca ok. Follow. 7.  Hx CVA 8.  Severe dementia  9.  Adult FTT - see above, not tolerating HD as outpatient - crying/demanding to sign off early nearly every treatment. Pls consider palliative input. Her daughter was not available today for discussion, patient too demented to have meaningful conversation.  Veneta Penton, PA-C 10/04/2018, 10:33 AM  [facility] Pager: [phone]

## 2018-10-04 NOTE — Progress Notes (Signed)
Dr. Annie Paras notified us RUE completed, DVT noted from brachial to axillary.  Will cancel IV team.

## 2018-10-04 NOTE — Progress Notes (Signed)
PIV consult: Arrived to room, informed by Vaughan Basta, RN that pt has DVT in RUE. LUE restricted. Vaughan Basta will coordinate with MD for alternatives.

## 2018-10-04 NOTE — Progress Notes (Signed)
Lab notified RN unable to get blood for labs after 3 attempts.  IV in RUA leaking with IV rocephin infusion, IV d/ced.  Dr. Annie Paras notified that unable to obtain labs and patient has no IV access.  Will place order for IV team to place IV.

## 2018-10-04 NOTE — Progress Notes (Signed)
Patient had 11 beats VT per CCMD. Pt asymptomatic. On call Dr. Eileen Stanford made aware. No new orders. Will continue to monitor pt.

## 2018-10-04 NOTE — Progress Notes (Signed)
Hypoglycemic Event  CBG: 62   Treatment: 1/2 amp D50 IV, with increase to 64 at 0833hrs, second 1/2 amp D50 IV given  Symptoms: no symptoms  Follow-up CBG: 0908 CBG Result: 79  Possible Reasons for Event: poor po intake Comments/MD notified: Dr. Laural Golden notified, to continue q2h CBG.    Myrtis Hopping

## 2018-10-04 NOTE — Progress Notes (Signed)
   Subjective: Overnight patient had 11 beat run of VT, asymptomatic and sleeping during. On morning rounds she was somnolent. States she was having pain in her right hand due to a blood draw. No other complaints. Denies SOB, cough, chest pain, n/v.   Objective:  Vital signs in last 24 hours: Vitals:   10/04/18 0008 10/04/18 0432 10/04/18 0755 10/04/18 0802  BP: (!) 90/28 (!) 108/24 (!) 84/64 136/60  Pulse: 97 98 93 93  Resp: 12 12 11 12   Temp: 97.7 F (36.5 C) 97.6 F (36.4 C) (!) 97.3 F (36.3 C)   TempSrc: Axillary Oral Axillary   SpO2: 100% 100% 100% 100%  Weight:  52.2 kg    Height:       Physical Exam: Gen: seen comfortably resting in bed, no distress CV: holosystolic murmur, RRR Pulm: CTA bilaterally, no wheezing  Ext: no edema, warm and well perfused, right hand edema   Assessment/Plan:  Active Problems:   Hypoglycemia   Pneumonia   Sepsis ([place])  Ms. Delair is a 78 yo female with a medical history of ESRD on HD MWF, CVA, diastolic heart failure (EF 60-65% and grade 1 diastolic dysfunction on 12/9676 ECHO), and GERD presenting after she had a syncopal episode while being dialyzed this morning. On arrival to the ED, she was found to be hypoglycemic, hypothermic and hypotensive.   Sepsis 2/2 pneumona: Chest xray showed left lober pneumonia with parapneumonic effusion. Hypothermia, hypoglycemia and hypotension improved today. She has chronic hypotension on midodrine. Lactic acid 4 overnight so she was given another 500 cc normal saline bolus.  - Continue IV ceftriaxone  - Blood cultures pending  - Cardiac monitoring  - Midodrine   Hypoglycemia: 99, this morning. Given D5 yesterday. Most likely in the setting of decreased PO intake.  - CBG monitoring   ESRD on HD MWF: Patient had a syncopal episode during HD yesterday. Appears euvolemic clinically. Nephrology consulted for HD, appreciated help.  - K of 5.3 - Cacitriol   Dispo: Anticipated discharge pending  clinical improvement.  Mike Craze, DO 10/04/2018, 8:59 AM Pager: [phone]

## 2018-10-04 NOTE — Progress Notes (Addendum)
Paged by nurse as she is concerned for right upper extremity edema and coolness to touch. Evaluated at bedside. Patients arm is edematous +1 pitting distally from the proximal shoulder. There is no appreciable edema of the right shoulder or neck. Hand is cool to touch, pulses are palpable but only +1 at best. Given the history of multiple PIV placement attempts and current deep PIV placement in conjunction with the physical exam, there is some concern for venous thromboembolism.   Plan: Vas US Venous RUE ordered STAT IV does not appear to have infiltrated, due to the difficulty in obtaining an IV and lack of alternative site we will complete the IV antibiotic administration and follow-up with the ultrasound.   Vitals:   10/04/18 1113 10/04/18 1653  BP: (!) 96/58 125/62  Pulse: 94 89  Resp: 11 12  Temp: (!) 97.3 F (36.3 C)   SpO2: 100%    Also paged due to concern for multiple asymptomatic runs of V-Tach of ~ 7 beats in duration. Patient is ESRD with last BMP demonstrating a potassium of 5.3 but prior day at 8.5. Plan: Repeat a STAT BMP and Mag.  Continue to monitor on telemetry.  Kathi Ludwig, MD [facility], PGY-2

## 2018-10-05 DIAGNOSIS — I82621 Acute embolism and thrombosis of deep veins of right upper extremity: Secondary | ICD-10-CM

## 2018-10-05 DIAGNOSIS — I82A11 Acute embolism and thrombosis of right axillary vein: Secondary | ICD-10-CM

## 2018-10-05 LAB — BASIC METABOLIC PANEL
Anion gap: 9 (ref 5–15)
BUN: 39 mg/dL — ABNORMAL HIGH (ref 8–23)
CO2: 25 mmol/L (ref 22–32)
Calcium: 8 mg/dL — ABNORMAL LOW (ref 8.9–10.3)
Chloride: 105 mmol/L (ref 98–111)
Creatinine, Ser: 5.54 mg/dL — ABNORMAL HIGH (ref 0.44–1.00)
GFR calc Af Amer: 8 mL/min — ABNORMAL LOW (ref >60)
GFR calc non Af Amer: 7 mL/min — ABNORMAL LOW (ref >60)
Glucose, Bld: 84 mg/dL (ref 70–99)
Potassium: 4.5 mmol/L (ref 3.5–5.1)
Sodium: 139 mmol/L (ref 135–145)

## 2018-10-05 LAB — CBC
HCT: 33.9 % — ABNORMAL LOW (ref 36.0–46.0)
HEMATOCRIT: 31 % — AB (ref 36.0–46.0)
Hemoglobin: 10 g/dL — ABNORMAL LOW (ref 12.0–15.0)
Hemoglobin: 10.2 g/dL — ABNORMAL LOW (ref 12.0–15.0)
MCH: 27.6 pg (ref 26.0–34.0)
MCH: 26.4 pg (ref 26.0–34.0)
MCHC: 32.3 g/dL (ref 30.0–36.0)
MCHC: 30.1 g/dL (ref 30.0–36.0)
MCV: 85.6 fL (ref 80.0–100.0)
MCV: 87.6 fL (ref 80.0–100.0)
Platelets: 106 10*3/uL — ABNORMAL LOW (ref 150–400)
Platelets: 130 10*3/uL — ABNORMAL LOW (ref 150–400)
RBC: 3.62 MIL/uL — ABNORMAL LOW (ref 3.87–5.11)
RBC: 3.87 MIL/uL (ref 3.87–5.11)
RDW: 19.6 % — ABNORMAL HIGH (ref 11.5–15.5)
RDW: 19.8 % — ABNORMAL HIGH (ref 11.5–15.5)
WBC: 6.6 10*3/uL (ref 4.0–10.5)
WBC: 6.2 10*3/uL (ref 4.0–10.5)
nRBC: 0 % (ref 0.0–0.2)
nRBC: 0 % (ref 0.0–0.2)

## 2018-10-05 LAB — RENAL FUNCTION PANEL
Albumin: 1.3 g/dL — ABNORMAL LOW (ref 3.5–5.0)
Anion gap: 9 (ref 5–15)
BUN: 38 mg/dL — ABNORMAL HIGH (ref 8–23)
CO2: 25 mmol/L (ref 22–32)
Calcium: 7.9 mg/dL — ABNORMAL LOW (ref 8.9–10.3)
Chloride: 102 mmol/L (ref 98–111)
Creatinine, Ser: 5.62 mg/dL — ABNORMAL HIGH (ref 0.44–1.00)
GFR calc Af Amer: 8 mL/min — ABNORMAL LOW (ref >60)
GFR calc non Af Amer: 7 mL/min — ABNORMAL LOW (ref >60)
Glucose, Bld: 83 mg/dL (ref 70–99)
Phosphorus: 5.5 mg/dL — ABNORMAL HIGH (ref 2.5–4.6)
Potassium: 4 mmol/L (ref 3.5–5.1)
Sodium: 136 mmol/L (ref 135–145)

## 2018-10-05 LAB — GLUCOSE, CAPILLARY
Glucose-Capillary: 58 mg/dL — ABNORMAL LOW (ref 70–99)
Glucose-Capillary: 70 mg/dL (ref 70–99)
Glucose-Capillary: 75 mg/dL (ref 70–99)
Glucose-Capillary: 78 mg/dL (ref 70–99)

## 2018-10-05 LAB — HEPATITIS B SURFACE ANTIGEN: HEP B S AG: NEGATIVE

## 2018-10-05 MED ORDER — MIDODRINE HCL 5 MG PO TABS
10.0000 mg | ORAL_TABLET | Freq: Once | ORAL | Status: AC
  Administered 2018-10-05: 10 mg via ORAL

## 2018-10-05 MED ORDER — ENOXAPARIN SODIUM 60 MG/0.6ML ~~LOC~~ SOLN
50.0000 mg | SUBCUTANEOUS | Status: DC
  Administered 2018-10-05 – 2018-10-06 (×2): 50 mg via SUBCUTANEOUS
  Filled 2018-10-05 (×2): qty 0.6

## 2018-10-05 MED ORDER — ALTEPLASE 2 MG IJ SOLR: 2.0000 mg | Freq: Once | INTRAMUSCULAR | Status: DC | PRN

## 2018-10-05 MED ORDER — SODIUM CHLORIDE 0.9 % IV SOLN: 100.0000 mL | INTRAVENOUS | Status: DC | PRN

## 2018-10-05 MED ORDER — RENA-VITE PO TABS
1.0000 | ORAL_TABLET | Freq: Every day | ORAL | Status: DC
  Administered 2018-10-05: 1 via ORAL
  Filled 2018-10-05 (×3): qty 1

## 2018-10-05 MED ORDER — HEPARIN SODIUM (PORCINE) 1000 UNIT/ML DIALYSIS: 1000.0000 [IU] | INTRAMUSCULAR | Status: DC | PRN

## 2018-10-05 MED ORDER — NEPRO/CARBSTEADY PO LIQD
237.0000 mL | Freq: Two times a day (BID) | ORAL | Status: DC
  Administered 2018-10-06: 237 mL via ORAL
  Filled 2018-10-05 (×2): qty 237

## 2018-10-05 MED ORDER — LIDOCAINE-PRILOCAINE 2.5-2.5 % EX CREA: 1.0000 "application " | TOPICAL_CREAM | CUTANEOUS | Status: DC | PRN

## 2018-10-05 MED ORDER — CEFDINIR 300 MG PO CAPS
300.0000 mg | ORAL_CAPSULE | ORAL | Status: DC
  Administered 2018-10-05: 300 mg via ORAL
  Filled 2018-10-05 (×2): qty 1

## 2018-10-05 MED ORDER — CEFDINIR 300 MG PO CAPS: 300.0000 mg | ORAL_CAPSULE | ORAL | Status: DC

## 2018-10-05 MED ORDER — LIDOCAINE HCL (PF) 1 % IJ SOLN: 5.0000 mL | INTRAMUSCULAR | Status: DC | PRN

## 2018-10-05 MED ORDER — MIDODRINE HCL 5 MG PO TABS
ORAL_TABLET | ORAL | Status: AC
  Filled 2018-10-05: qty 2

## 2018-10-05 MED ORDER — PENTAFLUOROPROP-TETRAFLUOROETH EX AERO: 1.0000 "application " | INHALATION_SPRAY | CUTANEOUS | Status: DC | PRN

## 2018-10-05 NOTE — Progress Notes (Signed)
[place] for lovenox Indication: DVT  Allergies  Allergen Reactions  . Hydrocodone Nausea And Vomiting and Other (See Comments)    Dizziness, also  . Tape Other (See Comments)    TAPE LEAVES DARK PATCHES ON THE SKIN!!   Patient Measurements: Height: 5\' 3"  (160 cm) Weight: 113 lb 4.8 oz (51.4 kg) IBW/kg (Calculated) : 52.4 Heparin Dosing Weight: 50 kg  Vital Signs: Temp: 97.5 F (36.4 C) (03/30 0659) Temp Source: Axillary (03/30 0659) BP: 145/42 (03/30 0659) Pulse Rate: 94 (03/30 0659)  Labs: Recent Labs    10/03/18 2138 10/04/18 0559 10/05/18 0624 10/05/18 0737  HGB 9.8*  --  10.0* 10.2*  HCT 32.1*  --  31.0* 33.9*  PLT 89*  --  106* 130*  CREATININE  --  5.24* 5.54* 5.62*   Estimated Creatinine Clearance: 6.9 mL/min (A) (by C-G formula based on SCr of 5.62 mg/dL (H)).  Assessment: 4 yoF presenting following syncopal episode during HD, found to have acute RUE DVT (No AC PTA). She currently has no IV access and pharmacy consulted to dose lovenox. She is noted with ESRD and plans for coumadin later. Plans for possible central line access  -Hg= 10.2, plt= 130 -lovenox 50mg  [place] given around midnight  Goal of Therapy:  Heparin level 0.3-0.7 units/ml Monitor platelets by anticoagulation protocol: Yes   Plan:  -Lovenox 50mg  [place] q24hr (next dose at 10pm) -PT/INR in am  Hildred Laser, PharmD Clinical Pharmacist **Pharmacist phone directory can now be found on [email or web address] ([email or web address]).  Listed under [place].

## 2018-10-05 NOTE — Progress Notes (Signed)
Internal Medicine Attending:   I saw and examined the patient. I reviewed the resident's note and I agree with the resident's findings and plan as documented in the resident's note. As noted by Dr. Idamae Schuller she now has an acute DVT of the brachial and axillary veins of her right upper extremity she has her fistula in her left upper extremity.  She does not currently have IV access, and she has ESRD making treatment a little bit more complicated.  I would pursue a 65-month course of warfarin for what is likely a provoked upper extremity DVT.  However her bridge to Coumadin is little more complicated we have discussed with pharmacy and we will try to bridge with Lovenox with appropriate monitoring.  Blood cultures are still pending but she seems to be stable on IV ceftriaxone.

## 2018-10-05 NOTE — Progress Notes (Addendum)
[facility] Progress Note   Dialysis Orders: TTS at [facility] 3:30hr, 400/A1.5, EDW 42.5kg, 3K/2Ca, profile #4, AVF, no heparin - Mircera 100 q 2 weeks (last 3/28)  Assessment/Plan: 1.  Syncopal episode: on outpatient HD 3/28 - per primary.-  2.  L HCAP with effusion: On Ceftriaxone; follow. 3.  ESRD: Usual HD TTS schedule but under-dialyzed in general (see HPI). Needs palliative care evaluation and family meeting. Pt clearly not tolerating HD/overall declining - she is too demented to make decision. Off schedule today  4.  Hypertension/volume: BP variable. Breathing comfortably, but with some LE edema/volume on imaging. 6 - 10 above EDW - likely to have gained some weight in SNF. EDW too low - goal set for 2 L today - sats ok o room air. 5.  Anemia: Hgb 10- just given ESA as outpatient - follow. 6.  Metabolic bone disease: Corr Ca ok. Follow. 7.  Hx CVA 8.  severe dementia  9.  Adult FTT - see above, not tolerating HD as outpatient - crying/demanding to sign off early nearly every treatment- never runs more than 2 hours and skips treatments. Pls consider palliative input. Her daughter was not available during admission. patient too demented to have meaningful conversation. 10. Thrombocytopenia  11. Malnutrition - alb still low though seems to be gaining some body weight- dit + vits/supplements  Myriam Jacobson, PA-C [place] [phone] 10/05/2018,8:22 AM  LOS: 2 days   Pt seen, examined and agree w A/P as above.  [facility] 10/05/2018, 5:06 PM    Subjective:   C/o wearing a mask.  Knows she is at [place]:   10/04/18 1653 10/04/18 1958 10/05/18 0040 10/05/18 0659  BP: 125/62 (!) 66/50 132/64 (!) 145/42  Pulse: 89 94 95 94  Resp: 12 12 11 13   Temp: (!) 97.1 F (36.2 C) 97.7 F (36.5 C) 98 F (36.7 C) (!) 97.5 F (36.4 C)  TempSrc: Axillary Oral Oral Axillary  SpO2:  100% 100% 100%  Weight:     51.4 kg  Height:       Physical Exam General: thin elderly female intermittently yelling out - sats ok on room air Heart:RRR Lungs: clear anteriorly - dim bases Abdomen: soft NT  Extremities: no sig LE edema - Dialysis Access: left AVF pateint   Additional Objective Labs: Basic Metabolic Panel: Recent Labs  Lab 10/03/18 1130 10/04/18 0559 10/05/18 0624  NA 133* 138 139  K 5.1 5.3* 4.5  CL 98 103 105  CO2 19* 23 25  GLUCOSE 56* 80 84  BUN 37* 39* 39*  CREATININE 5.17* 5.24* 5.54*  CALCIUM 7.8* 7.8* 8.0*   CBC: Recent Labs  Lab 10/03/18 1118 10/03/18 2138 10/05/18 0624  WBC  --  8.4 6.6  NEUTROABS  --  6.9  --   HGB 11.2* 9.8* 10.0*  HCT 33.0* 32.1* 31.0*  MCV  --  86.1 85.6  PLT  --  89* 106*   Blood Culture    Component Value Date/Time   SDES BLOOD RIGHT ARM 10/03/2018 2138   SPECREQUEST  10/03/2018 2138    BOTTLES DRAWN AEROBIC ONLY Blood Culture adequate volume   CULT  10/03/2018 2138    NO GROWTH < 24 HOURS Performed at [facility], [address], [place], [place] [address]    REPTSTATUS PENDING 10/03/2018 2138    Cardiac Enzymes: No results for input(s): CKTOTAL, CKMB, CKMBINDEX, TROPONINI in the last  168 hours. CBG: Recent Labs  Lab 10/04/18 1311 10/04/18 1523 10/04/18 1715 10/04/18 2004 10/05/18 0031  GLUCAP 82 84 93 102* 78   Iron Studies: No results for input(s): IRON, TIBC, TRANSFERRIN, FERRITIN in the last 72 hours. Lab Results  Component Value Date   INR 1.13 03/29/2018   INR 1.03 03/06/2018   INR 1.30 02/17/2017   Studies/Results: Dg Chest 2 View  Result Date: 10/03/2018 CLINICAL DATA:  Syncopal episode during dialysis. EXAM: CHEST - 2 VIEW COMPARISON:  July 03, 2018 FINDINGS: No pneumothorax. A small left-sided pleural effusion with underlying opacity is identified. The cardiomediastinal silhouette is stable. Mild pulmonary venous congestion. IMPRESSION: Left-sided pleural effusion with underlying opacity.  Cardiomegaly and mild pulmonary venous congestion. Electronically Signed   By: Dorise Bullion III M.D   On: 10/03/2018 16:47   Vas [place] Upper Extremity Venous Duplex  Result Date: 10/04/2018 UPPER VENOUS STUDY  Indications: Edema Limitations: Edema and bandages. Comparison Study: No prior study on file for comparison Performing Technologist: Sharion Dove RVS  Examination Guidelines: A complete evaluation includes B-mode imaging, spectral Doppler, color Doppler, and power Doppler as needed of all accessible portions of each vessel. Bilateral testing is considered an integral part of a complete examination. Limited examinations for reoccurring indications may be performed as noted.  Right Findings: +----------+------------+---------+-----------+----------+-------+ RIGHT     CompressiblePhasicitySpontaneousPropertiesSummary +----------+------------+---------+-----------+----------+-------+ IJV           Full       Yes       Yes                      +----------+------------+---------+-----------+----------+-------+ Subclavian               Yes       Yes                      +----------+------------+---------+-----------+----------+-------+ Axillary      None       No        No                Acute  +----------+------------+---------+-----------+----------+-------+ Brachial      None       No        No                Acute  +----------+------------+---------+-----------+----------+-------+ Radial        Full                                          +----------+------------+---------+-----------+----------+-------+ Ulnar         Full                                          +----------+------------+---------+-----------+----------+-------+ Cephalic      None                                   Acute  +----------+------------+---------+-----------+----------+-------+ Basilic       None                                   Acute   +----------+------------+---------+-----------+----------+-------+  Left Findings: +----------+------------+---------+-----------+----------+-------+ LEFT      CompressiblePhasicitySpontaneousPropertiesSummary +----------+------------+---------+-----------+----------+-------+ Subclavian               Yes       Yes                      +----------+------------+---------+-----------+----------+-------+  Summary:  Right: Findings consistent with acute deep vein thrombosis involving the right axillary vein and right brachial veins. Findings consistent with acute superficial vein thrombosis involving the right basilic vein and right cephalic vein. Significant interstitial fluid noted throughout.  Left: No evidence of thrombosis in the subclavian.  *See table(s) above for measurements and observations.    Preliminary    Medications: . sodium chloride Stopped (10/04/18 1736)  . sodium chloride    . sodium chloride    . cefTRIAXone (ROCEPHIN)  IV Stopped (10/04/18 1814)   . aspirin EC  81 mg Oral Daily  . [START ON 10/06/2018] calcitRIOL  2 mcg Oral Q T,Th,Sa-HD  . Chlorhexidine Gluconate Cloth  6 each Topical Q0600  . latanoprost  1 drop Both Eyes QHS  . memantine  5 mg Oral BID  . [START ON 10/06/2018] midodrine  10 mg Oral Once per day on Tue Thu Sat  . mirtazapine  15 mg Oral QHS  . OLANZapine  2.5 mg Oral QHS

## 2018-10-05 NOTE — TOC Initial Note (Addendum)
Transition of Care [facility]) - Initial/Assessment Note    Patient Details  Name: Nancy [name] MRN: [id number] Date of Birth: July 26, 1940  Transition of Care [facility] CM/SW Contact:    Bethena Roys, RN Phone Number: 10/05/2018, 3:41 PM  Clinical Narrative: CM completed readmission high risk screening. Pt presented for hypoglycemia and syncopal episode. Hx of ESRD TTS schedule. Palliative Care consulted for goals of care meeting. CM will continue to monitor for transition of care needs.                     Expected Discharge Plan: Skilled Nursing Facility Barriers to Discharge: Continued Medical Work up, Other (comment)(Palliative Consulted)   Patient Goals and CMS Choice   CMS Medicare.gov Compare Post Acute Care list provided to:: (N/A) Choice offered to / list presented to : NA  Expected Discharge Plan and Services Expected Discharge Plan: Skilled Nursing Facility In-house Referral: Clinical Social Work Discharge Planning Services: CM Consult Post Acute Care Choice: [place]                   DME Arranged: N/A DME Agency: NA HH Arranged: NA HH Agency: NA  Prior Living Arrangements/Services   Lives with:: Facility Resident([place])              Current home services: (N/A)    Activities of Daily Living Home Assistive Devices/Equipment: Other (Comment)(from SNF) ADL Screening (condition at time of admission) Patient's cognitive ability adequate to safely complete daily activities?: No Is the patient deaf or have difficulty hearing?: No Does the patient have difficulty seeing, even when wearing glasses/contacts?: Yes Does the patient have difficulty concentrating, remembering, or making decisions?: Yes Patient able to express need for assistance with ADLs?: Yes Does the patient have difficulty dressing or bathing?: Yes Independently performs ADLs?: No Communication: Needs assistance Is this a change from baseline?: Pre-admission  baseline Dressing (OT): Needs assistance Is this a change from baseline?: Pre-admission baseline Grooming: Needs assistance Is this a change from baseline?: Pre-admission baseline Feeding: Needs assistance Is this a change from baseline?: Pre-admission baseline Bathing: Needs assistance Is this a change from baseline?: Pre-admission baseline Toileting: Needs assistance Is this a change from baseline?: Pre-admission baseline In/Out Bed: Needs assistance Is this a change from baseline?: Pre-admission baseline Walks in Home: Needs assistance Is this a change from baseline?: Pre-admission baseline Does the patient have difficulty walking or climbing stairs?: Yes Weakness of Legs: Both Weakness of Arms/Hands: Both  Permission Sought/Granted                  Emotional Assessment         Alcohol / Substance Use: Not Applicable Psych Involvement: No (comment)  Admission diagnosis:  Bleeding [R58] Hypoglycemia [E16.2] ESRD (end stage renal disease) ([place]) [N18.6] Syncope, unspecified syncope type [R55] Patient Active Problem List   Diagnosis Date Noted  . Acute deep vein thrombosis (DVT) of axillary vein of right upper extremity ([place])   . Pneumonia 10/04/2018  . Sepsis ([place]) 10/04/2018  . Hypoglycemia 10/03/2018  . Pancytopenia ([place])   . Discitis of thoracolumbar region   . Psychomotor agitation   . Epidural abscess 07/04/2018  . Acute osteomyelitis of thoracic spine ([place]) 07/03/2018  . DNR (do not resuscitate) discussion   . Right renal mass   . Chronic cough 04/17/2018  . Acute gastritis without hemorrhage   . Duodenitis with bleeding   . ESRD (end stage renal disease) ([place])   . Papular  lichenification 51/76/1607  . Acute on chronic blood loss anemia 03/06/2018  . Palliative care by specialist   . Protein-calorie malnutrition, severe 01/12/2018  . Midline thoracic back pain 12/23/2017  . Symptomatic anemia 02/17/2017  . Secondary hyperparathyroidism of renal  origin ([place]) 01/15/2017  . Mitral valve annular calcification   . Dizziness 12/08/2016  . Angina at rest [facility])   . Goals of care, counseling/discussion   . Palliative care encounter   . Aortic stenosis 05/21/2016  . Anemia associated with chronic renal failure 02/01/2016  . Atherosclerosis of aorta ([place]) 01/11/2015  . End stage renal disease ([place]) 02/04/2013  . Non-intractable vomiting with nausea 08/17/2012  . Glaucoma 03/18/2012  . Health care maintenance 09/17/2011  . Osteoarthrosis involving lower leg 10/31/2008  . History of CVA (cerebrovascular accident) 01/28/2008  . Hyperlipidemia 02/13/2007  . PULMONARY NODULES 01/12/2007  . Left ventricular hypertrophy 09/02/2006  . Essential hypertension 08/11/2006  . GERD 08/11/2006   PCP:  Aldine Contes, MD Pharmacy:   [place], [place] - [address] [place] [address] Phone: [phone] Fax: [phone]  [place], [facility] [address] [place] [address] [place] [address] Phone: [phone] Fax: [phone]     Social Determinants of Health ([place]) Interventions    Readmission Risk Interventions No flowsheet data found.

## 2018-10-05 NOTE — Progress Notes (Addendum)
   Subjective: Overnight 5-7 beats of asymptomatic VT. Patient was seen during HD this morning. She states she was not feeling well but very somnolent and could not state what was bothering her. Reported a headache and had notable right arm swelling.   Objective:  Vital signs in last 24 hours: Vitals:   10/04/18 1113 10/04/18 1653 10/04/18 1958 10/05/18 0040  BP: (!) 96/58 125/62 (!) 66/50 132/64  Pulse: 94 89 94 95  Resp: 11 12 12 11   Temp: (!) 97.3 F (36.3 C) (!) 97.1 F (36.2 C) 97.7 F (36.5 C) 98 F (36.7 C)  TempSrc: Axillary Axillary Oral Oral  SpO2: 100%  100% 100%  Weight:      Height:       Physical Exam Gen: seen during HD, no distress, somnolent CV: holosystolic murmur, RRR Ext: right UE pitting edema, bilateral LE no edema, warm and well perfused   Assessment/Plan:  Active Problems:   Hypoglycemia   Pneumonia   Sepsis ([place])  Nancy [name] is a 78yo female with a medical history of ESRD on HD MWF, CVA, diastolic heart failure (EF 60-65% and grade 1 diastolic dysfunction on 09/5571 ECHO), and GERD presenting after she had a syncopal episode while being dialyzed this morning.On arrival to the ED, she was found to be hypoglycemic, hypothermic and hypotensive.    Sepsis 2/2 pneumona: Chest xray showed left lober pneumonia with parapneumonic effusion. Hypothermia, hypoglycemia and hypotension improved. Afebrile, no leukocytosis. She has chronic hypotension on midodrine.   - Continue IV ceftriaxone - Blood cultures NGTD - Cardiac monitoring  - Midodrine  Right UE DVT: Paged by RN about right UE edema and coolness. DVT showed acute DVT involving right axillary vein and right brachial veins. Unable to start IV heparin due to no IV access. Per pharmacy, given subcutaneous lovenox for 24 hours. Given she has ESRD, limited therapy options with anticoagulants. Will need 3 months of warfarin therapy and will need to bridge for 5 days. She has no IV access at this point and  due to lack of alternate IV site (right UE DVT and left UE fistula) can consider central line. Discussed with pharmacy and can renally dose lovenox, if greater than 3 days will need to check levels.  - Renally dose lovenox, per pharmacy, appreciate help - Consider central line  Hypoglycemia:99, this morning. Given D5 yesterday. Most likely in the setting of decreased PO intake.  - CBG monitoring  ESRD on HD TTS:  HD today, off schedule. Per nephro, patient is under-dialyzed in general and not tolerating HD well. Consulted palliative care. -Nephrology consulted for HD, appreciated help - K 4.5 - Cacitriol   Dispo: Anticipated discharge pending clinical improvement.   Mike Craze, DO 10/05/2018, 6:58 AM Pager: [phone]

## 2018-10-06 ENCOUNTER — Ambulatory Visit (HOSPITAL_COMMUNITY): Admission: RE | Admit: 2018-10-06 | Payer: Medicare Other | Source: Ambulatory Visit

## 2018-10-06 DIAGNOSIS — R627 Adult failure to thrive: Secondary | ICD-10-CM

## 2018-10-06 DIAGNOSIS — N186 End stage renal disease: Secondary | ICD-10-CM

## 2018-10-06 DIAGNOSIS — Z515 Encounter for palliative care: Secondary | ICD-10-CM

## 2018-10-06 DIAGNOSIS — Z66 Do not resuscitate: Secondary | ICD-10-CM

## 2018-10-06 DIAGNOSIS — H5711 Ocular pain, right eye: Secondary | ICD-10-CM

## 2018-10-06 LAB — GLUCOSE, CAPILLARY
GLUCOSE-CAPILLARY: 63 mg/dL — AB (ref 70–99)
GLUCOSE-CAPILLARY: 57 mg/dL — AB (ref 70–99)
GLUCOSE-CAPILLARY: 74 mg/dL (ref 70–99)
Glucose-Capillary: 70 mg/dL (ref 70–99)
Glucose-Capillary: 85 mg/dL (ref 70–99)
Glucose-Capillary: 80 mg/dL (ref 70–99)

## 2018-10-06 MED ORDER — WARFARIN SODIUM 5 MG PO TABS
5.0000 mg | ORAL_TABLET | Freq: Once | ORAL | Status: AC
  Administered 2018-10-06: 5 mg via ORAL
  Filled 2018-10-06 (×2): qty 1

## 2018-10-06 MED ORDER — CALCITRIOL 0.5 MCG PO CAPS: 2.0000 ug | ORAL_CAPSULE | Freq: Once | ORAL | Status: DC

## 2018-10-06 MED ORDER — WARFARIN - PHARMACIST DOSING INPATIENT: Freq: Every day | Status: DC

## 2018-10-06 MED ORDER — CHLORHEXIDINE GLUCONATE CLOTH 2 % EX PADS: 6.0000 | MEDICATED_PAD | Freq: Every day | CUTANEOUS | Status: DC

## 2018-10-06 MED ORDER — GLUCAGON HCL RDNA (DIAGNOSTIC) 1 MG IJ SOLR
INTRAMUSCULAR | Status: AC
  Administered 2018-10-06: 1 mg via SUBCUTANEOUS
  Filled 2018-10-06: qty 1

## 2018-10-06 MED ORDER — GLUCAGON HCL RDNA (DIAGNOSTIC) 1 MG IJ SOLR
1.0000 mg | INTRAMUSCULAR | Status: DC | PRN
  Filled 2018-10-06: qty 1

## 2018-10-06 MED ORDER — GLUCAGON HCL RDNA (DIAGNOSTIC) 1 MG IJ SOLR
1.0000 mg | Freq: Once | INTRAMUSCULAR | Status: AC | PRN
  Administered 2018-10-06: 1 mg via SUBCUTANEOUS

## 2018-10-06 MED ORDER — DEXTROSE 50 % IV SOLN
INTRAVENOUS | Status: AC
  Filled 2018-10-06: qty 50

## 2018-10-06 NOTE — Progress Notes (Signed)
Internal Medicine Attending:   I saw and examined the patient. I reviewed the resident's note and I agree with the resident's findings and plan as documented in the resident's note.  As discussed by Dr. Hermina Barters was little bit bothered by some right eye pain she has good extraocular range of motion no acute injection or other abnormality of the eye itself she does have some right-sided facial swelling I cannot appreciate any sort of dental pain or dental abscess.  Otherwise she had no complaints.  I did call Nancy [name]'s daughter and medical decision maker Nancy [name].  I updated her on her mother status I discussed with her that her mother presented with low blood pressure low blood glucose and low body temperature and was found to have a pneumonia.  We have been treating this with antibiotics but unfortunately have lost peripheral IV access due to a right upper extremity DVT.  I discussed that she has not clinically worsened and that we have switched to an oral antibiotic we are also treating with blood thinners for her DVT and continuing other medications.  I discussed that she is not eating well or drinking her blood protein levels are very low and that I agree with nephrology that she has a failure to thrive picture.  I reaffirm that she is a DNR.  I discussed some of the risks and complications of feeding tubes as well as a central line.  I discussed multiple options with Nancy [name].  At this time she discussed that she will talk with her mother as well as gather additional family support and encourage oral intake.  We will continue oral antibiotics, anticoagulation, IM glucagon as needed.  And the current scope of care.  However at this time we will not pursue more aggressive care with a IV central line or other invasive procedures, she will also continue HD if able. Without IV access and with her low blood pressures I am concerned that this could be her terminal event and discussed this with Nancy [name].  She  has acknowledged that her mother has been through multiple medical issues and has been declining.  We are currently under visitor restrictions due to the novel coronavirus however I discussed if she worsens clinically with less responsiveness then I would pursue an exception to our visitor restriction to allow her to see her mother in person if I suspect that she will pass away within a few hours.

## 2018-10-06 NOTE — Progress Notes (Signed)
Hypoglycemic Event  CBG: 57  Treatment:Glucagon IM 1mg   Symptoms: pt asymtomatic  Follow-up CBG: Time:1421 CBG Result:85  Possible Reasons for Event: pt refused to eat/ drink Comments/MD notified: Laural Golden, MD    [facility]  [place]

## 2018-10-06 NOTE — Progress Notes (Addendum)
[facility] Progress Note   Dialysis Orders: TTS at [facility] 3:30hr, 400/A1.5, EDW 42.5kg, 3K/2Ca, profile #4, AVF, no heparin - Mircera 100 q 2 weeks (last 3/28)  Assessment/Plan: 1.  Syncopal episode: on outpatient HD 3/28 - per primary.-  2.  L HCAP with effusion: On Ceftriaxone; follow. 3.  ESRD: Usual HD TTS schedule but under-dialyzed in general (see HPI). Poorly tolerates dialysis.  Yelling intermittently. Plan next HD Wednesday and then can transition back to TTS schedule on Sat. 4.  Hypertension/volume: BP variable. Breathing comfortably, but with some LE edema/volume on imaging. 6 - 10 above EDW - likely to have gained some weight in SNF. Needs EDW raised - unable to UF any fluid Monday. Raise EDW for d/c but has some generalized edema that we are not able to UF due to low BP - will use alb for BP support but this is only putting a bandaid on the greater issue of FTT 5.  Anemia: Hgb 10.2- just given ESA as outpatient - follow. 6.  Metabolic bone disease: Corr Ca 11 - hold hold calcitriol for now - off binders at present P 5.5 - follow 7.  Hx CVA 8.  severe dementia  9.  Adult FTT - see above, not tolerating HD as outpatient - crying/demanding to sign off early nearly every treatment- never runs more than 2 hours and skips treatments. Pls consider palliative input. Her daughter was not available during admission. patient too demented to have meaningful conversation. 10. Thrombocytopenia - trending up 11. Malnutrition - alb very low - 1.3 though seems to be gaining some body weight- dit + vits/supplements 12. Right UE DVT - provoked - for 3 months coumadin - high risk patient - SNF will need to follow INRs  Myriam Jacobson, PA-C [place] [phone] 10/06/2018,10:34 AM  LOS: 3 days   Pt seen, examined and agree w A/P as above.  [facility] 10/06/2018, 5:43 PM    Subjective:  Random  comments/complaints.  Objective Vitals:   10/05/18 2017 10/06/18 0619 10/06/18 0621 10/06/18 0729  BP: (!) 131/59 (!) 100/38  (!) 90/31  Pulse:  99  98  Resp: 17 13  11   Temp:  98 F (36.7 C)  97.7 F (36.5 C)  TempSrc:  Oral  Axillary  SpO2:  100%  100%  Weight:   51.5 kg   Height:       Physical Exam General: thin elderly female with some generalized edema Heart:RRR Lungs:- dim bases Abdomen: soft NT  Extremities: + LE edema - Dialysis Access: left AVF + bruit - with left arm swelling (she is lying on that side)  L > R   Additional Objective Labs: Basic Metabolic Panel: Recent Labs  Lab 10/04/18 0559 10/05/18 0624 10/05/18 0737  NA 138 139 136  K 5.3* 4.5 4.0  CL 103 105 102  CO2 23 25 25   GLUCOSE 80 84 83  BUN 39* 39* 38*  CREATININE 5.24* 5.54* 5.62*  CALCIUM 7.8* 8.0* 7.9*  PHOS  --   --  5.5*   CBC: Recent Labs  Lab 10/03/18 2138 10/05/18 0624 10/05/18 0737  WBC 8.4 6.6 6.2  NEUTROABS 6.9  --   --   HGB 9.8* 10.0* 10.2*  HCT 32.1* 31.0* 33.9*  MCV 86.1 85.6 87.6  PLT 89* 106* 130*   Blood Culture    Component Value Date/Time   SDES BLOOD RIGHT ARM 10/03/2018 2138   SPECREQUEST  10/03/2018  2138    BOTTLES DRAWN AEROBIC ONLY Blood Culture adequate volume   CULT  10/03/2018 2138    NO GROWTH 3 DAYS Performed at [facility], [place] [address] [place], [place] [address]    REPTSTATUS PENDING 10/03/2018 2138    Cardiac Enzymes: No results for input(s): CKTOTAL, CKMB, CKMBINDEX, TROPONINI in the last 168 hours. CBG: Recent Labs  Lab 10/05/18 0031 10/05/18 1324 10/05/18 1653 10/05/18 2008 10/06/18 0725  GLUCAP 78 58* 75 70 63*   Iron Studies: No results for input(s): IRON, TIBC, TRANSFERRIN, FERRITIN in the last 72 hours. Lab Results  Component Value Date   INR 1.13 03/29/2018   INR 1.03 03/06/2018   INR 1.30 02/17/2017   Studies/Results: Vas [place] Upper Extremity Venous Duplex  Result Date: 10/05/2018 UPPER VENOUS STUDY   Indications: Edema Limitations: Edema and bandages. Comparison Study: No prior study on file for comparison Performing Technologist: Sharion Dove RVS  Examination Guidelines: A complete evaluation includes B-mode imaging, spectral Doppler, color Doppler, and power Doppler as needed of all accessible portions of each vessel. Bilateral testing is considered an integral part of a complete examination. Limited examinations for reoccurring indications may be performed as noted.  Right Findings: +----------+------------+---------+-----------+----------+-------+ RIGHT     CompressiblePhasicitySpontaneousPropertiesSummary +----------+------------+---------+-----------+----------+-------+ IJV           Full       Yes       Yes                      +----------+------------+---------+-----------+----------+-------+ Subclavian               Yes       Yes                      +----------+------------+---------+-----------+----------+-------+ Axillary      None       No        No                Acute  +----------+------------+---------+-----------+----------+-------+ Brachial      None       No        No                Acute  +----------+------------+---------+-----------+----------+-------+ Radial        Full                                          +----------+------------+---------+-----------+----------+-------+ Ulnar         Full                                          +----------+------------+---------+-----------+----------+-------+ Cephalic      None                                   Acute  +----------+------------+---------+-----------+----------+-------+ Basilic       None                                   Acute  +----------+------------+---------+-----------+----------+-------+  Left Findings: +----------+------------+---------+-----------+----------+-------+ LEFT      CompressiblePhasicitySpontaneousPropertiesSummary  +----------+------------+---------+-----------+----------+-------+ Subclavian  Yes       Yes                      +----------+------------+---------+-----------+----------+-------+  Summary:  Right: Findings consistent with acute deep vein thrombosis involving the right axillary vein and right brachial veins. Findings consistent with acute superficial vein thrombosis involving the right basilic vein and right cephalic vein. Significant interstitial fluid noted throughout.  Left: No evidence of thrombosis in the subclavian.  *See table(s) above for measurements and observations.  Diagnosing physician: Ruta [place] MD Electronically signed by Ruta [place] MD on 10/05/2018 at 8:49:17 PM.    Final    Medications: . sodium chloride Stopped (10/04/18 1736)   . aspirin EC  81 mg Oral Daily  . calcitRIOL  2 mcg Oral Q T,Th,Sa-HD  . cefdinir  300 mg Oral Q48H  . Chlorhexidine Gluconate Cloth  6 each Topical Q0600  . enoxaparin (LOVENOX) injection  50 mg Subcutaneous Q24H  . feeding supplement (NEPRO CARB STEADY)  237 mL Oral BID BM  . latanoprost  1 drop Both Eyes QHS  . memantine  5 mg Oral BID  . midodrine  10 mg Oral Once per day on Tue Thu Sat  . mirtazapine  15 mg Oral QHS  . multivitamin  1 tablet Oral QHS  . OLANZapine  2.5 mg Oral QHS  . warfarin  5 mg Oral ONCE-1800  . Warfarin - Pharmacist Dosing Inpatient   Does not apply [phone]

## 2018-10-06 NOTE — Progress Notes (Signed)
Pt's BP soft, notified Rehman, MD. Per MD, continue to monitor pt, will need to notify MD if SBP <70 or pt symtomatic.

## 2018-10-06 NOTE — Progress Notes (Signed)
Per Laural Golden, MD, ok to wait until after HD treatment tomorrow to check labs since pt has both arms restricted.

## 2018-10-06 NOTE — Progress Notes (Signed)
Pt refused Coumadin, RN explained to pt why she needed it, she still refused.

## 2018-10-06 NOTE — Progress Notes (Signed)
Pt has 13 beats run of Vtach, notified Rehman MD. Pt asymtomatic, will continue to monitor closely.

## 2018-10-06 NOTE — Consult Note (Signed)
   [facility] Inpatient Consult   10/06/2018  [name] 09/30/1940 [id number]    Patient was screened forextremehigh risk score of 62% for unplanned readmissions, hospitalizations and high ED utilization; under her [employer]. Patient also has Medicaid. Patient had been previously active with services from [facility] until transitioned to long term care due to medical conditions.   Chart review shows that patient presented after she had a syncopal episode while being dialyzed and was found to be hypoglycemic, hypothermic and hypotensive (sepsis secondary to pneumonia, acute deep vein thrombosis (DVT) of axillary vein). Per history and physical on 10/03/18 reveals as follows: [name] is a 78 yo female with a medical history of ESRD on HD (T-T-S at [place]), CVA, HTN, diastolic heart failure (EF 60-65% and grade 1 diastolic dysfunction on 12/3843 ECHO), and GERD.   Per transition of care CM note, expected discharge disposition is skilled nursing facility and patient is a facility resident at [facility]. Palliative Care was consulted for goals of care meeting.   If disposition changes and there are community follow-up needs or any transition of care needs from [facility], please refer.   Corynn Solberg A. Acie Custis, BSN, RN-BC [facility] Liaison Cell: [phone]

## 2018-10-06 NOTE — Progress Notes (Addendum)
[place] for lovenox, warfarin  Indication: DVT  Allergies  Allergen Reactions  . Hydrocodone Nausea And Vomiting and Other (See Comments)    Dizziness, also  . Tape Other (See Comments)    TAPE LEAVES DARK PATCHES ON THE SKIN!!   Patient Measurements: Height: 5\' 3"  (160 cm) Weight: 113 lb 8.6 oz (51.5 kg) IBW/kg (Calculated) : 52.4 Heparin Dosing Weight: 50 kg  Vital Signs: Temp: 97.7 F (36.5 C) (03/31 0729) Temp Source: Axillary (03/31 0729) BP: 90/31 (03/31 0729) Pulse Rate: 98 (03/31 0729)  Labs: Recent Labs    10/03/18 2138 10/04/18 0559 10/05/18 0624 10/05/18 0737  HGB 9.8*  --  10.0* 10.2*  HCT 32.1*  --  31.0* 33.9*  PLT 89*  --  106* 130*  CREATININE  --  5.24* 5.54* 5.62*   Estimated Creatinine Clearance: 6.9 mL/min (A) (by C-G formula based on SCr of 5.62 mg/dL (H)).  Assessment: 31 yoF presenting following syncopal episode during HD, found to have acute RUE DVT (No AC PTA). She currently has no IV access and pharmacy consulted to dose lovenox (no IV access available). She is noted with ESRD and starting coumadin today -INR= 1.13, CBC stable  Goal of Therapy:  Heparin level 0.3-0.7 units/ml Monitor platelets by anticoagulation protocol: Yes   Plan:  -Lovenox 50mg  [place] q24hr  -Coumadin 5mg  today -Daily PT/INR  Hildred Laser, PharmD Clinical Pharmacist **Pharmacist phone directory can now be found on [email or web address] ([email or web address]).  Listed under [place].

## 2018-10-06 NOTE — Progress Notes (Signed)
   Subjective:  Nancy [name] denies any pain in her face, she reported that her eye is bothering her, and that it's been going on for about 6 months. She also reports that her left arm has been hurting. We discussed that we are treated her with antibiotics to treat her pneumonia. All questions were answered.   Objective:  Vital signs in last 24 hours: Vitals:   10/05/18 1700 10/05/18 2017 10/06/18 0619 10/06/18 0621  BP: (!) 89/37 (!) 131/59 (!) 100/38   Pulse: 95  99   Resp: 10 17 13    Temp: 97.8 F (36.6 C)  98 F (36.7 C)   TempSrc: Oral  Oral   SpO2: 95%  100%   Weight:    51.5 kg  Height:       Physical Exam HEENT: Facial fullness of right cheek, thin film over right eye, no conjunctivitis or drainage  Abdominal: diffuse tenderness to palpation, non distended, soft  Ext: mild bilateral LE edema, warm and well perfused, bilateral UE edema   Assessment/Plan:  Active Problems:   Hypoglycemia   Pneumonia   Sepsis (HCC)   Acute deep vein thrombosis (DVT) of axillary vein of right upper extremity (HCC)  Nancy [name] is a 78yo female with a medical history of ESRD on HD MWF, CVA, diastolic heart failure (EF 60-65% and grade 1 diastolic dysfunction on 12/3891 ECHO), and GERD presenting after she had a syncopal episode while being dialyzed this morning.On arrival to the ED, she was found to be hypoglycemic, hypothermic and hypotensive.    Sepsis 2/2 pneumona: Chest xray showed left lober pneumonia with parapneumonic effusion.  -Due to lack of IV access, switched to cefdinir yesterday  - Cefdinir started 3/30, renally dosed every 48 hours and to also be given post-HD - Blood cultures NGTD - Cardiac monitoring  - Midodrine  Right UE DVT: US showed acute DVT involving right axillary vein and right brachial veins. She has no IV access. Bridging with subcutaneous lovenox. Starting warfarin today. She will need 3 months of anticoagulation therapy for a provoked DVT. Appreciate  pharmacy's help - Renally dose lovenox, per pharmacy, appreciate help - Start warfarin   Hypoglycemia:70, this morning. - CBG monitoring  ESRD on HD TTS: HD yesterday, off schedule. Per nephro, patient is under-dialyzed in general and not tolerating HD well. Consulted palliative care. -Nephrology consulted for HD, appreciated help - Cacitriol  Dispo: Anticipated discharge in approximately 5 days, she is being bridged to warfarin.   Mike Craze, DO 10/06/2018, 7:22 AM Pager: [phone]

## 2018-10-06 NOTE — TOC Progression Note (Signed)
Transition of Care [facility]) - Progression Note    Patient Details  Name: Nancy [name] MRN: [id number] Date of Birth: 11/24/1940  Transition of [facility]) CM/SW Contact  Estanislado Emms, LCSW Phone Number: 10/06/2018, 3:02 PM  Clinical Narrative: TOC following for disposition planning needs. CSW discussed patient's possible return to [facility] with facility admissions. They would be able to accept patient back while undergoing a lovenox/coumadin bridge. However, if patient is planned for discharge today or tomorrow, they would require that patient be tested for COVID 19 before returning. Starting Thursday, patient can be discharged back to them without a test, as they will have a dedicated section for COVID positive or suspected patients.  Discussed with MD Rehman. As patient not showing symptoms/not suspected COVID, will not test. Attending did have discussion with patient's family regarding patient condition and prognosis. Palliative consult pending as well. CSW to follow for patient progress and continued recommendations and will support with disposition planning.        Expected Discharge Plan: [facility] Barriers to Discharge: Continued Medical Work up, Other (comment)(Palliative Consulted)  Expected Discharge Plan and Services Expected Discharge Plan: Skilled Nursing Facility In-house Referral: Clinical Social Work Discharge Planning Services: CM Consult Post Acute Care Choice: [place]                   DME Arranged: N/A DME Agency: NA HH Arranged: NA HH Agency: NA   Social Determinants of Health (SDOH) Interventions    Readmission Risk Interventions No flowsheet data found.

## 2018-10-06 NOTE — Consult Note (Signed)
Consultation Note Date: 10/06/2018   Patient Name: Nancy [name]  DOB: 1940/12/18  MRN: [id number]  Age / Sex: 78 y.o., female  PCP: Nancy Contes, MD Referring Physician: Lucious Groves, DO  Reason for Consultation: Establishing goals of care and Psychosocial/spiritual support  HPI/Patient Profile: 78 y.o. female  admitted on 10/03/2018 with past medical history significant for end-stage renal disease/dialysis dependent, history of CVA, hyperlipidemia, anemia of chronic disease, congestive heart failure.  According to electronic medical record notes compliance with dialysis has been an ongoing issue.  Capacity has been evaluated in the past by psychiatry.    Admitted from dialysis for a syncopal episode and was found to be hypothermic hypotensive and hypoglycemic.    Work-up has revealed a left lower lobe pneumonia with a parapneumonic effusion she has been started on ceftriaxone.   Patient has had multiple hospitalizations and emergency room visits over the past 6 months.  This patient has also had multiple consultations with the palliative medicine team over the last several years.  This is a complicated medical and psychosocial situation.  Need for continued conversation regarding goals of care.   Clinical Assessment and Goals of Care:   This NP Nancy [name] reviewed medical records, received report from team, assessed the patient and then meet at the patient's bedside/patient does not have capacity at this time and spoke by phone with her daughter/Nancy [name]  to discuss diagnosis, prognosis, GOC, EOL wishes disposition and options.  Concept of Palliative Care was discussed  A  discussion was had today regarding advanced directives.  Concepts specific to code status, artifical feeding and hydration, continued IV antibiotics, dialysis and rehospitalization was had.  The difference between a  aggressive medical intervention path  and a palliative comfort care path for this patient at this time was had.  Values and goals of care important to patient and family were attempted to be elicited.  Nancy verbalizes limited insight into the  seriousness of her mother's medical conditions but at this time she verbalizes a desire for continued medical interventions to prolong life, and she hopes for improvement for her mother.  Explored Nancy's understanding of her mother's wishes regarding dialysis.  We talked to the fact that the patient seems to have been reluctant to dialysis all along since initiation, and the on going/intermittent verbalization of not wanting to continue dialysis.  We discussed quality of life over quantity of life.  Emotional support offered.  Questions and concerns addressed.   Family encouraged to call with questions or concerns.    PMT will continue to support holistically.    No documented healthcare power of attorney, daughter Nancy [name] is the main contact and support person     SUMMARY OF RECOMMENDATIONS    Code Status/Advance Care Planning:  DNR   Palliative Prophylaxis:   Aspiration, Bowel Regimen, Delirium Protocol and Oral Care  Additional Recommendations (Limitations, Scope, Preferences):  Full Scope Treatment  Psycho-social/Spiritual:   Desire for further Chaplaincy support:no  Additional Recommendations: Education on Hospice  Created space and opportunity for Nancy to explore her thoughts and feelings regarding her mother's medical situation.  We discussed human mortality and the limitations of medical interventions when the body begins to fail to thrive.  Prognosis:   Unable to determine  Discharge Planning: To Be Determined      Primary Diagnoses: Present on Admission: . Hypoglycemia   I have reviewed the medical record, interviewed the patient and family, and examined the patient. The following aspects are pertinent.   Past Medical History:  Diagnosis Date  . Anemia   . Aortic stenosis   . Bacterial sinusitis 09/17/2011  . CHF (congestive heart failure) ([place])   . CKD (chronic kidney disease) stage 4, GFR 15-29 ml/min ([place]) 08/11/2006   Cr continues to increase. Proteinuria on UA 02/10/12.    . Colitis   . CVA (cerebrovascular accident) [facility])    New hemorrhagic per CT scan '09  . Diverticulosis of colon   . Dysfunctional uterine bleeding   . ESRD (end stage renal disease) on dialysis ([place])    "MWF; [facility]" (11/27/2017)  . Fecal impaction ([place])   . Headache(784.0)   . Heart murmur   . HERNIORRHAPHY, HX OF 08/11/2006  . Hypertension   . OA (osteoarthritis)    bilateral knees  . Postmenopausal   . Pulmonary nodule   . TINEA CRURIS 01/12/2007   Social History   Socioeconomic History  . Marital status: Widowed    Spouse name: Not on file  . Number of children: Not on file  . Years of education: Not on file  . Highest education level: Not on file  Occupational History  . Not on file  Social Needs  . Financial resource strain: Not on file  . Food insecurity:    Worry: Never true    Inability: Never true  . Transportation needs:    Medical: No    Non-medical: No  Tobacco Use  . Smoking status: Never Smoker  . Smokeless tobacco: Never Used  Substance and Sexual Activity  . Alcohol use: Not on file  . Drug use: No    Comment: 08/15/08 UDS + cocaine  . Sexual activity: Not Currently  Lifestyle  . Physical activity:    Days per week: 0 days    Minutes per session: 0 min  . Stress: Not on file  Relationships  . Social connections:    Talks on phone: More than three times a week    Gets together: More than three times a week    Attends religious service: More than 4 times per year    Active member of club or organization: No    Attends meetings of clubs or organizations: Never    Relationship status: Widowed  Other Topics Concern  . Not on file  Social History Narrative   Member is  widowed, has 1 daughter and 1 granddaughter and 3 great grandchildren      Family History  Problem Relation Age of Onset  . Hypertension Mother   . Congestive Heart Failure Mother   . Heart attack Brother 69   Scheduled Meds: . aspirin EC  81 mg Oral Daily  . calcitRIOL  2 mcg Oral Q T,Th,Sa-HD  . [START ON 10/07/2018] calcitRIOL  2 mcg Oral Once in dialysis  . cefdinir  300 mg Oral Q48H  . Chlorhexidine Gluconate Cloth  6 each Topical Q0600  . dextrose      . enoxaparin (LOVENOX) injection  50 mg Subcutaneous Q24H  .  feeding supplement (NEPRO CARB STEADY)  237 mL Oral BID BM  . latanoprost  1 drop Both Eyes QHS  . memantine  5 mg Oral BID  . midodrine  10 mg Oral Once per day on Tue Thu Sat  . mirtazapine  15 mg Oral QHS  . multivitamin  1 tablet Oral QHS  . OLANZapine  2.5 mg Oral QHS  . warfarin  5 mg Oral ONCE-1800  . Warfarin - Pharmacist Dosing Inpatient   Does not apply q1800   Continuous Infusions: . sodium chloride Stopped (10/04/18 1736)   PRN Meds:.sodium chloride, acetaminophen **OR** acetaminophen, dextrose, glucagon (human recombinant), senna-docusate Medications Prior to Admission:  Prior to Admission medications   Medication Sig Start Date End Date Taking? Authorizing Provider  acetaminophen (TYLENOL) 325 MG tablet Take 650 mg by mouth 2 (two) times daily.   Yes [provider]  aspirin 81 MG EC tablet Take 1 tablet (81 mg total) by mouth daily. 05/14/18  Yes Masoudi, Elhamalsadat, MD  calcitRIOL (ROCALTROL) 0.5 MCG capsule Take 4 capsules (2 mcg total) by mouth every Monday, Wednesday, and Friday with hemodialysis. Patient taking differently: Take 2 mcg by mouth See admin instructions. Take 4 tablet (23mcg totally) by mouth on Harrell Lark, and Sat 12/30/17  Yes Neva Seat, MD  latanoprost (XALATAN) 0.005 % ophthalmic solution Place 1 drop into both eyes at bedtime. 04/11/18  Yes [provider]  memantine (NAMENDA) 5 MG tablet Take 5 mg by  mouth 2 (two) times daily.   Yes [provider]  midodrine (PROAMATINE) 10 MG tablet Take 1 tablet (10 mg total) by mouth daily. Take on dialysis days. Patient taking differently: Take 10 mg by mouth See admin instructions. Take 1 tablet (10 mg totally) by mouth on Harrell Lark, and Sat 07/29/18  Yes Dorrell, [name], MD  mirtazapine (REMERON) 15 MG tablet Take 15 mg by mouth at bedtime. 09/15/18  Yes [provider]  OLANZapine (ZYPREXA) 2.5 MG tablet Take 1 tablet (2.5 mg total) by mouth at bedtime. 07/29/18  Yes Dorrell, [name], MD  ondansetron (ZOFRAN-ODT) 4 MG disintegrating tablet Take 4 mg by mouth 2 (two) times daily.   Yes [provider]  OVER THE COUNTER MEDICATION Apply 1 application topically See admin instructions. Biofreeze 5% gel topically, apply to back, area of pain three times daily   Yes [provider]  oxyCODONE-acetaminophen (PERCOCET) 5-325 MG tablet Take 0.5-1 tablets by mouth every 4 (four) hours as needed. Patient taking differently: Take 1 tablet by mouth 2 (two) times daily as needed for moderate pain.  05/14/18  Yes Masoudi, Elhamalsadat, MD  senna-docusate (SENOKOT-S) 8.6-50 MG tablet Take 1 tablet by mouth at bedtime as needed for mild constipation. Patient taking differently: Take 1 tablet by mouth at bedtime.  05/14/18  Yes Masoudi, Elhamalsadat, MD  diphenhydrAMINE-zinc acetate (BENADRYL) cream Apply topically 3 (three) times daily as needed for itching. Patient not taking: Reported on 06/23/2018 04/20/18   [name], MD  multivitamin (RENA-VIT) TABS tablet Take 1 tablet by mouth at bedtime. Patient not taking: Reported on 10/03/2018 07/29/18   Dorrell, [name], MD   Allergies  Allergen Reactions  . Hydrocodone Nausea And Vomiting and Other (See Comments)    Dizziness, also  . Tape Other (See Comments)    TAPE LEAVES DARK PATCHES ON THE SKIN!!   Review of Systems  Unable to perform ROS: Dementia    Physical  Exam Cardiovascular:     Rate and Rhythm:  Normal rate and regular rhythm.     Heart sounds: Normal heart sounds.  Skin:    General: Skin is warm and dry.  Psychiatric:        Attention and Perception: She is inattentive.        Behavior: Behavior is agitated.        Cognition and Memory: Cognition is impaired.     Vital Signs: BP (!) 81/37   Pulse 99   Temp 97.9 F (36.6 C) (Axillary)   Resp (!) 5   Ht 5\' 3"  (1.6 m)   Wt 51.5 kg   SpO2 100%   BMI 20.11 kg/m  Pain Scale: 0-10   Pain Score: 2    SpO2: SpO2: 100 % O2 Device:SpO2: 100 % O2 Flow Rate: .   IO: Intake/output summary:   Intake/Output Summary (Last 24 hours) at 10/06/2018 1710 Last data filed at 10/06/2018 1033 Gross per 24 hour  Intake 220 ml  Output -  Net 220 ml    LBM: Last BM Date: (Unknown) Baseline Weight: Weight: 50 kg Most recent weight: Weight: 51.5 kg     Palliative Assessment/Data:   Discussed with Dr. Alfonse Spruce  Time In: 1630 Time Out: 1745 Time Total: 75 minutes Greater than 50%  of this time was spent counseling and coordinating care related to the above assessment and plan.  Signed by: Nancy Lessen, NP   Please contact Palliative Medicine Team phone at [phone] for questions and concerns.  For individual provider: See Shea Evans

## 2018-10-07 ENCOUNTER — Other Ambulatory Visit: Payer: Self-pay | Admitting: Internal Medicine

## 2018-10-07 DIAGNOSIS — Z91048 Other nonmedicinal substance allergy status: Secondary | ICD-10-CM

## 2018-10-07 DIAGNOSIS — Z885 Allergy status to narcotic agent status: Secondary | ICD-10-CM

## 2018-10-07 DIAGNOSIS — R627 Adult failure to thrive: Secondary | ICD-10-CM

## 2018-10-07 DIAGNOSIS — Z66 Do not resuscitate: Secondary | ICD-10-CM

## 2018-10-07 DIAGNOSIS — Z6820 Body mass index (BMI) 20.0-20.9, adult: Secondary | ICD-10-CM

## 2018-10-07 LAB — POCT I-STAT EG7
Acid-Base Excess: 2 mmol/L (ref 0.0–2.0)
Bicarbonate: 24.6 mmol/L (ref 20.0–28.0)
Calcium, Ion: 0.77 mmol/L — CL (ref 1.15–1.40)
HCT: 33 % — ABNORMAL LOW (ref 36.0–46.0)
Hemoglobin: 11.2 g/dL — ABNORMAL LOW (ref 12.0–15.0)
O2 Saturation: 88 %
Potassium: >8.5 mmol/L (ref 3.5–5.1)
SODIUM: 129 mmol/L — AB (ref 135–145)
TCO2: 26 mmol/L (ref 22–32)
pCO2, Ven: 31.7 mmHg — ABNORMAL LOW (ref 44.0–60.0)
pH, Ven: 7.499 — ABNORMAL HIGH (ref 7.250–7.430)
pO2, Ven: 49 mmHg — ABNORMAL HIGH (ref 32.0–45.0)

## 2018-10-07 LAB — CBC
HCT: 33.8 % — ABNORMAL LOW (ref 36.0–46.0)
Hemoglobin: 10.2 g/dL — ABNORMAL LOW (ref 12.0–15.0)
MCH: 26.7 pg (ref 26.0–34.0)
MCHC: 30.2 g/dL (ref 30.0–36.0)
MCV: 88.5 fL (ref 80.0–100.0)
PLATELETS: 146 10*3/uL — AB (ref 150–400)
RBC: 3.82 MIL/uL — ABNORMAL LOW (ref 3.87–5.11)
RDW: 20.1 % — ABNORMAL HIGH (ref 11.5–15.5)
WBC: 4.5 10*3/uL (ref 4.0–10.5)
nRBC: 0 % (ref 0.0–0.2)

## 2018-10-07 LAB — BASIC METABOLIC PANEL
Anion gap: 10 (ref 5–15)
BUN: 19 mg/dL (ref 8–23)
CO2: 26 mmol/L (ref 22–32)
Calcium: 8 mg/dL — ABNORMAL LOW (ref 8.9–10.3)
Chloride: 101 mmol/L (ref 98–111)
Creatinine, Ser: 3.91 mg/dL — ABNORMAL HIGH (ref 0.44–1.00)
GFR calc Af Amer: 12 mL/min — ABNORMAL LOW (ref >60)
GFR, EST NON AFRICAN AMERICAN: 11 mL/min — AB (ref >60)
Glucose, Bld: 68 mg/dL — ABNORMAL LOW (ref 70–99)
Potassium: 3.8 mmol/L (ref 3.5–5.1)
SODIUM: 137 mmol/L (ref 135–145)

## 2018-10-07 LAB — PROTIME-INR
INR: 1.4 — ABNORMAL HIGH (ref 0.8–1.2)
Prothrombin Time: 17.3 seconds — ABNORMAL HIGH (ref 11.4–15.2)

## 2018-10-07 LAB — GLUCOSE, CAPILLARY
Glucose-Capillary: 46 mg/dL — ABNORMAL LOW (ref 70–99)
Glucose-Capillary: 60 mg/dL — ABNORMAL LOW (ref 70–99)
Glucose-Capillary: 101 mg/dL — ABNORMAL HIGH (ref 70–99)
Glucose-Capillary: 80 mg/dL (ref 70–99)
Glucose-Capillary: 86 mg/dL (ref 70–99)

## 2018-10-07 MED ORDER — ENOXAPARIN SODIUM 60 MG/0.6ML ~~LOC~~ SOLN: 50.0000 mg | SUBCUTANEOUS | 0 refills | Status: AC

## 2018-10-07 MED ORDER — LIDOCAINE-PRILOCAINE 2.5-2.5 % EX CREA: 1.0000 "application " | TOPICAL_CREAM | CUTANEOUS | Status: DC | PRN

## 2018-10-07 MED ORDER — GLUCOSE 40 % PO GEL: 1.0000 | Freq: Once | ORAL | 0 refills | Status: AC | PRN

## 2018-10-07 MED ORDER — NEPRO/CARBSTEADY PO LIQD: 237.0000 mL | Freq: Two times a day (BID) | ORAL | 0 refills | Status: AC

## 2018-10-07 MED ORDER — ALBUMIN HUMAN 25 % IV SOLN
INTRAVENOUS | Status: AC
  Administered 2018-10-07: 25 g
  Filled 2018-10-07: qty 100

## 2018-10-07 MED ORDER — PENTAFLUOROPROP-TETRAFLUOROETH EX AERO: 1.0000 "application " | INHALATION_SPRAY | CUTANEOUS | Status: DC | PRN

## 2018-10-07 MED ORDER — ACETAMINOPHEN 325 MG PO TABS: 650.0000 mg | ORAL_TABLET | Freq: Two times a day (BID) | ORAL | Status: AC | PRN

## 2018-10-07 MED ORDER — SODIUM CHLORIDE 0.9 % IV SOLN: 100.0000 mL | INTRAVENOUS | Status: DC | PRN

## 2018-10-07 MED ORDER — LIDOCAINE HCL (PF) 1 % IJ SOLN: 5.0000 mL | INTRAMUSCULAR | Status: DC | PRN

## 2018-10-07 MED ORDER — ALTEPLASE 2 MG IJ SOLR: 2.0000 mg | Freq: Once | INTRAMUSCULAR | Status: DC | PRN

## 2018-10-07 MED ORDER — CEFDINIR 300 MG PO CAPS: 300.0000 mg | ORAL_CAPSULE | ORAL | 0 refills | Status: AC

## 2018-10-07 MED ORDER — WARFARIN SODIUM 2.5 MG PO TABS
2.5000 mg | ORAL_TABLET | Freq: Once | ORAL | Status: DC
  Filled 2018-10-07: qty 1

## 2018-10-07 MED ORDER — MIDODRINE HCL 5 MG PO TABS
ORAL_TABLET | ORAL | Status: AC
  Filled 2018-10-07: qty 2

## 2018-10-07 MED ORDER — ONDANSETRON 4 MG PO TBDP: 4.0000 mg | ORAL_TABLET | Freq: Two times a day (BID) | ORAL | 0 refills | Status: AC | PRN

## 2018-10-07 MED ORDER — HEPARIN SODIUM (PORCINE) 1000 UNIT/ML DIALYSIS: 1000.0000 [IU] | INTRAMUSCULAR | Status: DC | PRN

## 2018-10-07 MED ORDER — WARFARIN SODIUM 2.5 MG PO TABS: 2.5000 mg | ORAL_TABLET | Freq: Once | ORAL | 0 refills | Status: DC

## 2018-10-07 NOTE — Progress Notes (Signed)
   Subjective: Nancy [name] valuated at bedside during her HD session.  She states that she is having diffuse burning abdominal pain but does state that this has occurred during her HD sessions in the past.  She does not not have any other acute complaints.  Objective:  Vital signs in last 24 hours: Vitals:   10/06/18 1311 10/06/18 2038 10/07/18 0555 10/07/18 0557  BP: (!) 81/37 (!) 129/54 (!) 117/56   Pulse:  97 (!) 102   Resp: (!) 5 14 17    Temp:  97.8 F (36.6 C) 97.8 F (36.6 C)   TempSrc:  Oral Oral   SpO2:  100% 100%   Weight:    51 kg  Height:       Neuro: Lying in bed in no acute distress Cardiovascular: Normal rate, regular rhythm Respiratory: Clear to auscultation bilaterally, normal work of breathing MSK: Lower extremity edema, warmth, or erythema noted bilaterally.  Assessment/Plan:  Active Problems:   Hypoglycemia   Pneumonia   Sepsis ([place])   Acute deep vein thrombosis (DVT) of axillary vein of right upper extremity ([place])  Nancy [name] is a 78 year old female with ESRD on HD, chronic diastolic heart failure, and history of CVA who presented after syncopal episode and found to have pneumonia, right upper extremity DVT, and failure to thrive.  Sepsis secondary to community-acquired pneumonia: She is now day 3 antibiotics.  She was initially on IV ceftriaxone but due to losing IV access she was switched to p.o. cefdinir.  Culture showed no growth to date. - Continue cefdinir, renally dosed every 48 hours and to be given post-HD.  Next dose today. - Continuous cardiac monitoring - Continue Midodrine.  She did refuse this medication last night.   Right upper extremity DVT: Due to no IV access she initially started on subcu Lovenox and is now being bridged to warfarin (day 2/5).  She will need 3 months of anticoagulation therapy for provoked DVT. - Continue subcu Lovenox every 24 hours - Continue warfarin 5 mg daily  Failure to thrive: She is not eating well. Her blood  sugars have improved overnight (70's-80s). Will defer a feeding tube at this time.  - Continue glucagon as needed  ESRD on HD: Currently scheduled for MWF.  She is not tolerating HD well (very upset during her sessions and never runs more than 2 hours/skips treatments).  Palliative care has been consulted. - Continue HD for now - Follow-up palliative care recommendations  Dispo: Anticipated discharge pending palliative recommendations.  Carroll Sage, MD 10/07/2018, 6:45 AM Pager: [phone]

## 2018-10-07 NOTE — Progress Notes (Signed)
    Durable Medical Equipment  (From admission, onward)         Start     Ordered   10/07/18 1321  For home use only DME Hospital bed  Once    Question Answer Comment  Patient has (list medical condition): End stage renal disease, failure to thrive, deep vein thrombosis, pneumonia, chronic diastolic heart failure.   The above medical condition requires: Patient requires the ability to reposition frequently   Head must be elevated greater than: 30 degrees   Bed type Semi-electric      10/07/18 1321   10/07/18 1320  For home use only DME Overbed table  Once     10/07/18 1321

## 2018-10-07 NOTE — Progress Notes (Addendum)
[facility] Progress Note   Dialysis Orders: TTS at [facility] 3:30hr, 400/A1.5, EDW 42.5kg, 3K/2Ca, profile #4, AVF, no heparin - Mircera 100 q 2 weeks (last 3/28)  Assessment/Plan: 1.  Syncopal episode: on outpatient HD 3/28 - per primary. 2.  L HCAP with effusion: transitioned to oral atbx. 3.  ESRD: Usually HD TTS schedule but under-dialyzed in general (see HPI). Poorly tolerates dialysis with intermittent yelling though calm this am. HD today and then can transition back to TTS schedule on Sat.K 3.8 use 3 K bath 4.  Hypertension/volume: BP variable. Breathing comfortably, but with some LE edema/volume on imaging. 6 - 10 above EDW - likely to have gained some weight in SNF. Needs EDW raised - unable to UF any fluid Monday. Raise EDW for d/c but has some generalized edema that we are not able to UF due to low BP - will use alb for BP support but this is only putting a bandaid on the greater issue of FTT alb c 1 for BP support plus midodrine 5.  Anemia: Hgb 10.2- just given ESA as outpatient - follow. 6.  Metabolic bone disease: Corr Ca 11 - hold calcitriol for now - off binders at present P 5.5 - follow 7.  Hx CVA 8.  severe dementia  9.  Adult FTT - see above, not tolerating HD as outpatient - crying/demanding to sign off early nearly every treatment- never runs more than 2 hours and skips treatments. Pls consider palliative input. Her daughter was not available during admission. patient too demented to have meaningful conversation. We are doing custodial care.  She is not appropriate for long term dialysis. 10. Thrombocytopenia - trending up 11. Malnutrition - alb very low - 1.3 though seems to be gaining some body weight- dit + vits/supplements 12. Right UE DVT - provoked - for 3 months coumadin - high risk patient - SNF will need to follow INRs  Myriam Jacobson, PA-C [place] 10/07/2018,8:20 AM  LOS: 4 days   Pt seen, examined and agree  w A/P as above.  [facility] 10/07/2018, 3:50 PM    Subjective:  Seen on dialysis. Insists that her birthday was yesterday [birth year]).  Objective Vitals:   10/07/18 0700 10/07/18 0705 10/07/18 0710 10/07/18 0730  BP: (!) 117/39 (!) 122/54 (!) 122/39 (!) 100/38  Pulse: (!) 102 98 99 (!) 104  Resp: 16     Temp: 98 F (36.7 C)     TempSrc: Oral     SpO2: 98%     Weight: 51.5 kg     Height:       Physical Exam goal 2.2  General: thin elderly female with some generalized edema, oriented x 1 Heart:RRR Lungs:- dim bases Abdomen: soft NT  Extremities: + LE edema /SCDs- Dialysis Access: left AVF  -Qb 400 bilateral UE edema.   Additional Objective Labs: Basic Metabolic Panel: Recent Labs  Lab 10/05/18 0624 10/05/18 0737 10/07/18 0715  NA 139 136 137  K 4.5 4.0 3.8  CL 105 102 101  CO2 25 25 26   GLUCOSE 84 83 68*  BUN 39* 38* 19  CREATININE 5.54* 5.62* 3.91*  CALCIUM 8.0* 7.9* 8.0*  PHOS  --  5.5*  --    CBC: Recent Labs  Lab 10/03/18 2138 10/05/18 0624 10/05/18 0737 10/07/18 0715  WBC 8.4 6.6 6.2 4.5  NEUTROABS 6.9  --   --   --   HGB 9.8* 10.0* 10.2* 10.2*  HCT 32.1* 31.0* 33.9* 33.8*  MCV 86.1 85.6 87.6 88.5  PLT 89* 106* 130* 146*   Blood Culture    Component Value Date/Time   SDES BLOOD RIGHT ARM 10/03/2018 2138   SPECREQUEST  10/03/2018 2138    BOTTLES DRAWN AEROBIC ONLY Blood Culture adequate volume   CULT  10/03/2018 2138    NO GROWTH 3 DAYS Performed at [facility], [place] [address], [place], [place] [address]    REPTSTATUS PENDING 10/03/2018 2138    Cardiac Enzymes: No results for input(s): CKTOTAL, CKMB, CKMBINDEX, TROPONINI in the last 168 hours. CBG: Recent Labs  Lab 10/06/18 0938 10/06/18 1158 10/06/18 1421 10/06/18 1712 10/06/18 2134  GLUCAP 70 57* 85 80 74   Iron Studies: No results for input(s): IRON, TIBC, TRANSFERRIN, FERRITIN in the last 72 hours. Lab Results  Component Value Date   INR 1.4 (H)  10/07/2018   INR 1.13 03/29/2018   INR 1.03 03/06/2018   Studies/Results: No results found. Medications: . sodium chloride Stopped (10/04/18 1736)  . sodium chloride    . sodium chloride     . aspirin EC  81 mg Oral Daily  . calcitRIOL  2 mcg Oral Q T,Th,Sa-HD  . calcitRIOL  2 mcg Oral Once in dialysis  . cefdinir  300 mg Oral Q48H  . Chlorhexidine Gluconate Cloth  6 each Topical Q0600  . enoxaparin (LOVENOX) injection  50 mg Subcutaneous Q24H  . feeding supplement (NEPRO CARB STEADY)  237 mL Oral BID BM  . latanoprost  1 drop Both Eyes QHS  . memantine  5 mg Oral BID  . midodrine  10 mg Oral Once per day on Tue Thu Sat  . mirtazapine  15 mg Oral QHS  . multivitamin  1 tablet Oral QHS  . OLANZapine  2.5 mg Oral QHS  . Warfarin - Pharmacist Dosing Inpatient   Does not apply [phone]

## 2018-10-07 NOTE — Consult Note (Signed)
   [facility] Inpatient Consult   10/07/2018  [place] 1940-10-10 [id number]    Follow-up note:  Change of plan for disposition as notified by transition of care RN CM that the plan to transition to a skilled facility have changed and daughter would like patient to return home with home health services ([place], PT, OT, Aide, CSW.SOC). Transition of care case manager has made referral for patient to have Palliative Services to follow at home.  Patient will be on Lovenox Coumadin bridge and will be needing further education/ reinforcement with it.  Will refer patient to [facility] for complex disease management (sepsis/ Pneumonia, HF) and [facility] pharmacy for medication management and education with Lovenox-Coumadin bridge.  Patient has active consent on file for [facility] services.  Attempts to call patient in the room but phone had been busy. Was able to talk to patient briefly thereafter and verbally consented to [facility] services/ referrals.   For questions and additional information, please call:  Niasha Devins A. Janelli Welling, BSN, RN-BC [facility] Liaison Cell: [phone]

## 2018-10-07 NOTE — Discharge Summary (Signed)
Name: Nancy [name] MRN: [id number] DOB: [birth year] 78 y.o. PCP: Aldine Contes, MD  Date of Admission: 10/03/2018  9:51 AM Date of Discharge: 10/07/2018 Attending Physician: Lucious Groves, DO  Discharge Diagnosis: 1.  Sepsis secondary to left lower lobe pneumonia with parapneumonic effusion 2.  End-stage renal disease on hemodialysis 3.  Acute DVT of right upper extremity 4.  Failure to thrive  Discharge Medications: Allergies as of 10/07/2018      Reactions   Hydrocodone Nausea And Vomiting, Other (See Comments)   Dizziness, also   Tape Other (See Comments)   TAPE LEAVES DARK PATCHES ON THE SKIN!!      Medication List    TAKE these medications   acetaminophen 325 MG tablet Commonly known as:  TYLENOL Take 2 tablets (650 mg total) by mouth 2 (two) times daily as needed for mild pain, moderate pain, fever or headache. What changed:    when to take this  reasons to take this   aspirin 81 MG EC tablet Take 1 tablet (81 mg total) by mouth daily.   calcitRIOL 0.5 MCG capsule Commonly known as:  ROCALTROL Take 4 capsules (2 mcg total) by mouth every Monday, Wednesday, and Friday with hemodialysis. What changed:    when to take this  additional instructions   cefdinir 300 MG capsule Commonly known as:  OMNICEF Take 1 capsule (300 mg total) by mouth every other day for 6 days.   dextrose 40 % Gel Commonly known as:  GLUTOSE Take 37.5 g by mouth once as needed for low blood sugar.   diphenhydrAMINE-zinc acetate cream Commonly known as:  BENADRYL Apply topically 3 (three) times daily as needed for itching.   enoxaparin 60 MG/0.6ML injection Commonly known as:  LOVENOX Inject 0.5 mLs (50 mg total) into the skin daily for 3 days.   feeding supplement (NEPRO CARB STEADY) Liqd Take 237 mLs by mouth 2 (two) times daily between meals for 30 days.   latanoprost 0.005 % ophthalmic solution Commonly known as:  XALATAN Place 1 drop into both eyes at bedtime.    memantine 5 MG tablet Commonly known as:  NAMENDA Take 5 mg by mouth 2 (two) times daily.   midodrine 10 MG tablet Commonly known as:  PROAMATINE Take 1 tablet (10 mg total) by mouth daily. Take on dialysis days. What changed:    when to take this  additional instructions   mirtazapine 15 MG tablet Commonly known as:  REMERON Take 15 mg by mouth at bedtime.   multivitamin Tabs tablet Take 1 tablet by mouth at bedtime.   OLANZapine 2.5 MG tablet Commonly known as:  ZYPREXA Take 1 tablet (2.5 mg total) by mouth at bedtime.   ondansetron 4 MG disintegrating tablet Commonly known as:  ZOFRAN-ODT Take 1 tablet (4 mg total) by mouth 2 (two) times daily as needed for nausea or vomiting. What changed:    when to take this  reasons to take this   OVER THE COUNTER MEDICATION Apply 1 application topically See admin instructions. Biofreeze 5% gel topically, apply to back, area of pain three times daily   oxyCODONE-acetaminophen 5-325 MG tablet Commonly known as:  Percocet Take 0.5-1 tablets by mouth every 4 (four) hours as needed. What changed:    how much to take  when to take this  reasons to take this   senna-docusate 8.6-50 MG tablet Commonly known as:  Senokot-S Take 1 tablet by mouth at bedtime as needed for mild constipation. What  changed:  when to take this   warfarin 2.5 MG tablet Commonly known as:  COUMADIN Take 1 tablet (2.5 mg total) by mouth one time only at 6 PM.       Disposition and follow-up:   Nancy [name] was discharged from [facility] in Stable condition.  At the hospital follow up visit please address:  1.  Please assess compliance to medications including anti-coagulation and antibiotics.   2. Please assess for signs of failure-to-thrive.   3. Please continue to address goals of care.   4.  Labs / imaging needed at time of follow-up: CBG  5.  Pending labs/ test needing follow-up: None  Follow-up Appointments:   Follow up with [place]  Monday Oct 12, 2018  Virtual hospital follow-up visit on Monday, April 6 at [facility] Course by problem list: 1.  Sepsis secondary to community-acquired pneumonia: Nancy [name] presented from dialysis after a syncopal episode and found to be hypothermic, hypotensive, and hypoglycemic.  Work-up including chest x-ray revealed the cause to be left lower lobe pneumonia with parapneumonic effusion.  She was started on IV ceftriaxone and transitioned to p.o. cefdinir after Nancy [name] lost IV access. During her admission, her vitals improved. Unfortunately she developed failure-to-thrive (see problem below). Due to her age, co-morbid conditions, and overall deconditioning we recommended that Nancy [name] be discharged to a SNF with hospice care. Her daughter, healthcare power of attorney, elected to be discharged to home with home health. She will need to continue PO cefdinir after each HD session (stop date 10/13/2018).  2.  End-stage renal disease on hemodialysis: Her schedule includes Tuesday/Thursday/Saturday. During her admission she underwent inpatient HD.  Unfortunately she did not tolerate HD (became very upset during her sessions and does not run more than 2 hours/skips treatments).  Palliative care was consulted and family elected to continue full scope care including HD. Please continue to broach the topic of discontinuing HD in the near-future.   3.  Acute DVT of right upper extremity: During her admission she developed right upper extremity swelling and an ultrasound showed acute DVT involving right axillary vein and right brachial veins.  Unfortunately she did not have IV access and was started on subcu Lovenox.  She was being bridged to Warfarin and will need to continue for an additional 3 days. Her daughter has agreed to give her the subcu Lovenox at home. An order has been sent to her HD center to have PT/INR checked at next HD session as  well as every month for 6 months. Anti-coagulation therapy will be monitored by Dr. Maudie Mercury and Dr. Elie Confer at [facility] internal medicine Clinic.   4.  Failure to thrive: During her admission she began eating poorly and was found to have a very low albumin.  Additionally she developed hypoglycemia which was treated with IM glucagon as needed. Please continue to monitor her appetite, glucose levels, and weight.   Discharge Vitals:   BP (!) 112/35   Pulse 100   Temp 98 F (36.7 C) (Oral)   Resp 16   Ht 5\' 3"  (1.6 m)   Wt 51.5 kg   SpO2 98%   BMI 20.11 kg/m   Pertinent Labs, Studies, and Procedures:  3/28 CXR:  IMPRESSION: Left-sided pleural effusion with underlying opacity. Cardiomegaly and mild pulmonary venous congestion.  3/29 [place] Right Upper Extremity: Summary: Right: Findings consistent with acute deep vein thrombosis involving the right axillary vein and right  brachial veins. Findings consistent with acute superficial vein thrombosis involving the right basilic vein and right cephalic vein. Significant interstitial fluid noted throughout. Left: No evidence of thrombosis in the subclavian.   Discharge Instructions:  Diet - low sodium heart healthy   Discharge instructions   Thank you so much for allowing [place] to care for your mother. She was found to have pneumonia and a clot in her right arm we have been treating. Please make sure that you continue the antibiotics and blood thinners as below.  Pneumonia: She will need to take 1 tablet of cefdinir after every dialysis session (every 48 hours). Her last day will be April 7th.  Clot in arm: She will also need to continue blood thinners to try and dissolve the clot that she has in her arm. This will include subcutaneous Lovenox injections for the next 3 days. During this time she will also need to take 1 tablet of warfarin daily and continue this for the next 3 months. She should have her INR (special blood tests to check whether she  is on the right dose of warfarin) checked during her next dialysis session this Friday. I have sent in the order to have this done.  I have set up a virtual appointment with our clinic on Monday, April 6 at 11:15 AM. One of our doctors will call to check in on your mother and see how she is doing. These do not hesitate to call the clinic if anything were to arise prior to speaking to 1 of her doctors. Clinic number: [phone]    Signed: Carroll Sage, MD 10/07/2018, 9:25 AM   Pager: [phone]

## 2018-10-07 NOTE — Discharge Instructions (Signed)

## 2018-10-07 NOTE — Progress Notes (Addendum)
[place] for lovenox, warfarin  Indication: DVT  Allergies  Allergen Reactions  . Hydrocodone Nausea And Vomiting and Other (See Comments)    Dizziness, also  . Tape Other (See Comments)    TAPE LEAVES DARK PATCHES ON THE SKIN!!   Patient Measurements: Height: 5\' 3"  (160 cm) Weight: 110 lb 7.2 oz (50.1 kg) IBW/kg (Calculated) : 52.4 Heparin Dosing Weight: 50 kg  Vital Signs: Temp: 97.6 F (36.4 C) (04/01 1035) Temp Source: Oral (04/01 1035) BP: 113/32 (04/01 1035) Pulse Rate: 98 (04/01 1035)  Labs: Recent Labs    10/05/18 0624 10/05/18 0737 10/07/18 0715  HGB 10.0* 10.2* 10.2*  HCT 31.0* 33.9* 33.8*  PLT 106* 130* 146*  LABPROT  --   --  17.3*  INR  --   --  1.4*  CREATININE 5.54* 5.62* 3.91*   Estimated Creatinine Clearance: 9.7 mL/min (A) (by C-G formula based on SCr of 3.91 mg/dL (H)).  Assessment: 77 yoF presenting following syncopal episode during HD, found to have acute RUE DVT (No AC PTA). She currently has no IV access and pharmacy consulted to dose lovenox (no IV access available). She is noted with ESRD and started coumadin 3/31 -INR= 1.13>>1.4, CBC stable -Day 2 of coumadin/lovenox overlap  Goal of Therapy:  Heparin level 0.3-0.7 units/ml Monitor platelets by anticoagulation protocol: Yes   Plan:  -Lovenox 50mg  [place] q24hr  -Will check an anti-Xa level after the dose today -Coumadin 2.5mg  today -Daily PT/INR -Attempted education but due to severe dementia, this was not successful -Disposition pending but will need close follow-up  Hildred Laser, PharmD Clinical Pharmacist **Pharmacist phone directory can now be found on [email or web address] (PW [email or web address]).  Listed under [place].

## 2018-10-07 NOTE — Progress Notes (Signed)
RN go over the discharge instruction with pt's daughter on the phone. [facility] is arranged to pick pt up.

## 2018-10-07 NOTE — TOC Transition Note (Addendum)
Transition of Care [facility]) - CM/SW Discharge Note   Patient Details  Name: Nancy [name] MRN: [id number] Date of Birth: 02-21-1941  [facility]) CM/SW Contact:  Bethena Roys, RN Phone Number: 10/07/2018, 1:26 PM   Clinical Narrative:  Pt is a 62% risk for readmission rate. Plan was to transition to facility, however plans have changed and daughter wants pt to return home with [facility]. Referral provided to [facility] for RN, PT, OT, Aide, CSW.SOC to begin within 24-48 hours post transition home. [facility] will have CSW to assist with personal care services via Medicaid. Daughter wants DME referral via [place]- hospital bed can be delivered today. [employer] to see if Medicaid can cover over-bed table. Case Manager has made referral to [facility] for Palliative Services. Pt will be Lovenox Coumadin Bridge- staff RN to educate daughter and then [facility] RN aware to assist. INR to be drawn and results called to Internal Medicine Clinic. No further needs from CM at this time.     Final next level of care: [facility](Family declined SNF) Barriers to Discharge: No Barriers Identified   Patient Goals and CMS Choice   CMS Medicare.gov Compare Post Acute Care list provided to:: Other (Comment Required)(Daughter Susie) Choice offered to / list presented to : Adult Children  Discharge Placement                       Discharge Plan and Services In-house Referral: [facility], Clinical Social Work Discharge Planning Services: CM Consult Post Acute Care Choice: Durable Medical Equipment, Home Health(Palliative [place])          DME Arranged: Hospital bed, Overbed table DME Agency: Other - Comment([facility] [employer]) HH Arranged: RN, Disease Management, PT, OT, Nurse's Aide, Refused SNF, Social Work(Palliative Services via [facility]) [place]: [place]   Social  Determinants of Health (SDOH) Interventions     Readmission Risk Interventions No flowsheet data found.

## 2018-10-07 NOTE — Progress Notes (Signed)
Dialysis RN asked this RN to give patient's midodrine.  RN into administer and patient refused stating she is not taking anything right now.  RN explained to patient that she is going to dialysis and the midodrine will help her blood pressure.  RN asked patient if she would take it and patient replied "I already told you I'm not taking that shit now get out of here".  Dialysis RN updated.

## 2018-10-08 ENCOUNTER — Other Ambulatory Visit: Payer: Self-pay | Admitting: *Deleted

## 2018-10-08 ENCOUNTER — Encounter: Payer: Self-pay | Admitting: Internal Medicine

## 2018-10-08 ENCOUNTER — Encounter: Payer: Self-pay | Admitting: *Deleted

## 2018-10-08 ENCOUNTER — Other Ambulatory Visit: Payer: Self-pay | Admitting: Pharmacist

## 2018-10-08 DIAGNOSIS — I82A11 Acute embolism and thrombosis of right axillary vein: Secondary | ICD-10-CM

## 2018-10-08 LAB — CULTURE, BLOOD (ROUTINE X 2)
Culture: NO GROWTH
Culture: NO GROWTH
Special Requests: ADEQUATE

## 2018-10-08 NOTE — Patient Outreach (Signed)
[facility]) Care Management  Erin   10/08/2018  [place] B Trinka 1941/02/09 [id number]  Reason for referral: Education on Lovenox / Coumadin bridge  Referral source: [facility] Inpatient Liaison Current insurance: [facility]  PMHx includes but not limited to: ESRD on HD, severe dementia, FTT, hx CVA, anemia, recent hospitalization for syncopal episode during HD, found to have UE DVT and  HCAP started on 3 months of coumadin with lovenox bridge. 2 doses lovenox given inpatient and patient discharged home with [facility] home health.    Outreach:  Successful telephone call with patient's granddaughter, [name].  HIPAA identifiers verified.  Granddaughter reports patient discharged home on 10/05/2017 and has not had any Lovenox at home yet.  [facility] has not been to see the patient yet and check INR.  Granddaughter requests information on administration of Lovenox and education on both Lovenox and Coumadin.  She states patient has been on Coumadin in the past.    Instructed granddaughter on Lovenox administration technique and referred her also to Lovenox website for pictures of instructions.  I emphasized that the syringe is for 60mg  but patient should only receive 50mg .  Patient's last dose ~38 hours so next dose due as soon as possible.  Granddaughter voiced understanding and was able to see how much volume to inject on syringe.  We reviewed dosing frequency, rotating sites and reasoning behind why bridge therapy is necessary.  We reviewed Coumadin, INR goals, signs and symptoms of bleeding, and DD / food interactions.  Granddaughter is a CMA and feels comfortable with administering Lovenox.  During call, patient's daughter arrived home and reported that [facility] RN is coming into the home today to check INR.  Daughter and granddaughter decline a medication review.  I provided my phone number in case they would like to reach out to me with further questions.     Plan: Will close [facility] pharmacy case at this time.  Am happy to assist in the future as needed.   Ralene Bathe, PharmD, [place], PharmD, [place] [phone]

## 2018-10-08 NOTE — Patient Outreach (Signed)
[facility]) Care Management  10/08/2018  Nancy [name] December 20, 1940 [id number]   New referral received from hospital liaison as member was recently admitted for syncope/sepsis.  She was admitted from [facility] where she was to be for long term care but instead family decided to have her discharged home. Per chart, she has history of ESRD with dialysis, dementia, hypertension, aortic stenosis, GERD, and hyperlipidemia.    Call placed to granddaughter Nancy [name], one of her caregivers.  She is familiar with [facility]'s involvement over the past couple years.  She report that her mother Nancy [name], other caregiver, made the decision to take member home. Per [name] has quit her job and decided to be [name] primary caregiver. Member will have home health with [facility].  She expresses concern that member is no longer eating and is experiencing hypoglycemic episodes, yesterday as low as 44.  During hospitalization and multiple times prior to, member's steady decline has been discussed with recommendations for palliative care and/or hospice.  Member has also stated several times that she does not wish to continue with dialysis.    Palliative care discussed with Nancy [name], she agrees that they would benefit from another conversation.  Agrees to have palliative care team contact either herself or her mother Nancy [name], member's daughter.  Call placed to [facility] to place referral.  Will follow up within the next week.  If active with palliative care program, will close case.  [name], [place], MSN [place] [phone]

## 2018-10-08 NOTE — Progress Notes (Signed)
Lab orders for monthly Protime/INR x 6 months were  faxed to [phone] [facility].  Maryan Rued, PBT 10-08-2018  14:35   I spoke with Aldona Bar at [facility] and she confirmed fax received.  10-08-2018 14:55  Maryan Rued, PBT

## 2018-10-09 ENCOUNTER — Telehealth: Payer: Self-pay | Admitting: Internal Medicine

## 2018-10-09 DIAGNOSIS — I5030 Unspecified diastolic (congestive) heart failure: Secondary | ICD-10-CM | POA: Diagnosis not present

## 2018-10-09 DIAGNOSIS — N186 End stage renal disease: Secondary | ICD-10-CM | POA: Diagnosis not present

## 2018-10-09 DIAGNOSIS — J181 Lobar pneumonia, unspecified organism: Secondary | ICD-10-CM | POA: Diagnosis not present

## 2018-10-09 DIAGNOSIS — I132 Hypertensive heart and chronic kidney disease with heart failure and with stage 5 chronic kidney disease, or end stage renal disease: Secondary | ICD-10-CM | POA: Diagnosis not present

## 2018-10-09 DIAGNOSIS — A419 Sepsis, unspecified organism: Secondary | ICD-10-CM | POA: Diagnosis not present

## 2018-10-09 NOTE — Telephone Encounter (Signed)
Please call Nancy Lemmings RN back @ [phone] to reconcile the patient medications list a she has some questions.

## 2018-10-09 NOTE — Telephone Encounter (Signed)
Nancy [name] needs a VO from Hospice from the [place]

## 2018-10-09 NOTE — Telephone Encounter (Addendum)
Return call to [facility] - left message to call us back.

## 2018-10-09 NOTE — Telephone Encounter (Signed)
Went over [employer], reviewed televisit time, referred to nephrologist for meds used by them

## 2018-10-11 DIAGNOSIS — J181 Lobar pneumonia, unspecified organism: Secondary | ICD-10-CM | POA: Diagnosis not present

## 2018-10-11 DIAGNOSIS — A419 Sepsis, unspecified organism: Secondary | ICD-10-CM | POA: Diagnosis not present

## 2018-10-11 DIAGNOSIS — I5030 Unspecified diastolic (congestive) heart failure: Secondary | ICD-10-CM | POA: Diagnosis not present

## 2018-10-11 DIAGNOSIS — N186 End stage renal disease: Secondary | ICD-10-CM | POA: Diagnosis not present

## 2018-10-11 DIAGNOSIS — I132 Hypertensive heart and chronic kidney disease with heart failure and with stage 5 chronic kidney disease, or end stage renal disease: Secondary | ICD-10-CM | POA: Diagnosis not present

## 2018-10-12 ENCOUNTER — Telehealth: Payer: Self-pay

## 2018-10-12 ENCOUNTER — Ambulatory Visit (INDEPENDENT_AMBULATORY_CARE_PROVIDER_SITE_OTHER): Payer: Medicare Other | Admitting: Internal Medicine

## 2018-10-12 ENCOUNTER — Other Ambulatory Visit: Payer: Self-pay

## 2018-10-12 DIAGNOSIS — A419 Sepsis, unspecified organism: Secondary | ICD-10-CM | POA: Diagnosis not present

## 2018-10-12 DIAGNOSIS — R627 Adult failure to thrive: Secondary | ICD-10-CM

## 2018-10-12 DIAGNOSIS — I132 Hypertensive heart and chronic kidney disease with heart failure and with stage 5 chronic kidney disease, or end stage renal disease: Secondary | ICD-10-CM | POA: Diagnosis not present

## 2018-10-12 DIAGNOSIS — J189 Pneumonia, unspecified organism: Secondary | ICD-10-CM

## 2018-10-12 DIAGNOSIS — N186 End stage renal disease: Secondary | ICD-10-CM | POA: Diagnosis not present

## 2018-10-12 DIAGNOSIS — J181 Lobar pneumonia, unspecified organism: Secondary | ICD-10-CM

## 2018-10-12 DIAGNOSIS — I82A11 Acute embolism and thrombosis of right axillary vein: Secondary | ICD-10-CM

## 2018-10-12 DIAGNOSIS — I5030 Unspecified diastolic (congestive) heart failure: Secondary | ICD-10-CM | POA: Diagnosis not present

## 2018-10-12 NOTE — Progress Notes (Signed)
   CC: F/u PNA, ESRD, RUE DVT, and Failure to thrive  This is a telephone encounter between Nancy [name] and Nancy [name] on 10/12/2018 for F/u PNA, ESRD, RUE DVT, and Failure to thrive. The visit was conducted with the patient located at home and [facility] at [facility]. The patient's identity was confirmed using their DOB and current address. The his/her legal guardian has consented to being evaluated through a telephone encounter and understands the associated risks (an examination cannot be done and the patient may need to come in for an appointment) / benefits (allows the patient to remain at home, decreasing exposure to coronavirus). I personally spent 10 minutes on medical discussion.   HPI:  [name] is a 77 y.o. with PMH as below.   Please see A&P for assessment of the patient's acute and chronic medical conditions.   Past Medical History:  Diagnosis Date  . Anemia   . Aortic stenosis   . Bacterial sinusitis 09/17/2011  . CHF (congestive heart failure) ([place])   . CKD (chronic kidney disease) stage 4, GFR 15-29 ml/min ([place]) 08/11/2006   Cr continues to increase. Proteinuria on UA 02/10/12.    . Colitis   . CVA (cerebrovascular accident) [facility])    New hemorrhagic per CT scan '09  . Diverticulosis of colon   . Dysfunctional uterine bleeding   . ESRD (end stage renal disease) on dialysis ([place])    "MWF; [facility]" (11/27/2017)  . Fecal impaction ([place])   . Headache(784.0)   . Heart murmur   . HERNIORRHAPHY, HX OF 08/11/2006  . Hypertension   . OA (osteoarthritis)    bilateral knees  . Postmenopausal   . Pulmonary nodule   . TINEA CRURIS 01/12/2007   Review of Systems:  Performed and all others negative.  Assessment & Plan:   See Encounters Tab for problem based charting.  Patient discussed with Dr. Beryle Beams

## 2018-10-12 NOTE — Assessment & Plan Note (Signed)
HPI: During recent hospitalization patient was diagnosed with a RUE DVT. She was started on Warfarin. Has been taking this medication. Has not had INR checked but plan is for monitoring at HD center. She is no having any clinical signs of bleeding per family. RUE is not painful or swollen.   A/P: Continue warfarin. PT/INR to be checked at HD.

## 2018-10-12 NOTE — Telephone Encounter (Signed)
Pt's granddaughter requesting PCS for patient. Please call back.

## 2018-10-12 NOTE — Assessment & Plan Note (Signed)
HPI: Patient recently admitted to the hospital for PNA with parapneumonic effusion. She was discharged on PO cefdinir; however, since discharge she has not been taking the medication. Unfortunately the pharmacy did not have it so they are waiting on it to come in the mail. She has not had any fevers, cough, N/V, or other systemic signs of infection. Her mental status is back to baseline.   A/P: Although she did not complete her Abx course, clinically she seem stable. Nothing further to do at this point.

## 2018-10-12 NOTE — Telephone Encounter (Signed)
Return call to [place] -Verbal order given for "Palliative Care" per Dr Dareen Piano.

## 2018-10-12 NOTE — Assessment & Plan Note (Signed)
HPI: Patient with multiple medical comorbidities, severe malnutrition, and weight lose. Dependent on family. She continues to have poor PO intake. Family is encouraging her to eat more.   A/P: Family continuing to try to get patient to eat. With poor PO intake and multiple comorbidies prognosis is poor. Family does not want hospice at this point.

## 2018-10-12 NOTE — Progress Notes (Signed)
Medicine attending: Medical history, presenting problems,  and medications, reviewed with resident physician Dr Justin Helberg on the day of the patient telephone consultation and I concur with his evaluation and management plan. 

## 2018-10-12 NOTE — Telephone Encounter (Signed)
I agree. Thank you.

## 2018-10-12 NOTE — Telephone Encounter (Signed)
Call from [place], [place] - requesting verbal order for Palliative Care. Stated pt wants to be seen this afternoon. I will send request to PCP.

## 2018-10-12 NOTE — Assessment & Plan Note (Signed)
HPI: Patient is ESRD with HD TRS. She did not go on Saturday. Last session was 10/08/2018. Family is going to try to get her to HD tomorrow. She is having some uremic symptoms and family has noticed some increased fluid.   A/P: Continue HD per nephro. Family to sit with patient at each session.

## 2018-10-13 ENCOUNTER — Telehealth: Payer: Self-pay | Admitting: Pharmacist

## 2018-10-13 ENCOUNTER — Telehealth: Payer: Self-pay | Admitting: Internal Medicine

## 2018-10-13 ENCOUNTER — Telehealth: Payer: Self-pay

## 2018-10-13 DIAGNOSIS — J181 Lobar pneumonia, unspecified organism: Secondary | ICD-10-CM | POA: Diagnosis not present

## 2018-10-13 DIAGNOSIS — N2581 Secondary hyperparathyroidism of renal origin: Secondary | ICD-10-CM | POA: Diagnosis not present

## 2018-10-13 DIAGNOSIS — A419 Sepsis, unspecified organism: Secondary | ICD-10-CM | POA: Diagnosis not present

## 2018-10-13 DIAGNOSIS — I5030 Unspecified diastolic (congestive) heart failure: Secondary | ICD-10-CM | POA: Diagnosis not present

## 2018-10-13 DIAGNOSIS — N186 End stage renal disease: Secondary | ICD-10-CM | POA: Diagnosis not present

## 2018-10-13 DIAGNOSIS — I82721 Chronic embolism and thrombosis of deep veins of right upper extremity: Secondary | ICD-10-CM | POA: Diagnosis not present

## 2018-10-13 DIAGNOSIS — I132 Hypertensive heart and chronic kidney disease with heart failure and with stage 5 chronic kidney disease, or end stage renal disease: Secondary | ICD-10-CM | POA: Diagnosis not present

## 2018-10-13 LAB — POCT INR: INR: 4.8 — AB (ref 2.0–3.0)

## 2018-10-13 NOTE — Telephone Encounter (Signed)
I can fill it out. Thank you.

## 2018-10-13 NOTE — Telephone Encounter (Signed)
I agree

## 2018-10-13 NOTE — Telephone Encounter (Signed)
Thanks [employer]

## 2018-10-13 NOTE — Telephone Encounter (Signed)
Don with [facility] requesting VO for OT. Please call back.

## 2018-10-13 NOTE — Progress Notes (Addendum)
Tried contacting [employer] for PT/INR results, they stated they did not receive an order. I contacted [facility] to request PT/INR---they have already gone to patient's home today but can check her PT/INR tomorrow and Friday (spoke with Rhada, HHN). I asked Rhada to contact me with results and she agreed. I also spoke with patient's grandaughter, [name], to notify her of the plan.  I also placed a standing order with [facility] for PT/INR twice weekly for now until patient is therapeutic. Rhada confirmed order by repeat back.

## 2018-10-13 NOTE — Telephone Encounter (Signed)
PT VO for  1x week for 3 weeks for adl's, strengthening, non pharm methods of pain control, balance, safety. Do you agree

## 2018-10-13 NOTE — Telephone Encounter (Signed)
I spoke to [name] patients granddaughter after receiving a phone call to the clinic about PCS for patient. The grandaughter said patient was not able to dress or feed herself,not able to perform any of the daily activities not eating. The patient has Hospice and Pallative care involved so the personal services according to the repersenative from hospice would have to come from her primary care physician. I have placed a message to the DR. Narendra and placed the form in his box.

## 2018-10-13 NOTE — Telephone Encounter (Signed)
This is being addressed in separate phone encounter from today. Hubbard Hartshorn, RN, BSN

## 2018-10-13 NOTE — Telephone Encounter (Signed)
Per shaddha orders sent to the [facility] for her pt-inr [phone]

## 2018-10-14 ENCOUNTER — Telehealth (INDEPENDENT_AMBULATORY_CARE_PROVIDER_SITE_OTHER): Payer: Medicare Other | Admitting: Pharmacist

## 2018-10-14 DIAGNOSIS — A419 Sepsis, unspecified organism: Secondary | ICD-10-CM | POA: Diagnosis not present

## 2018-10-14 DIAGNOSIS — I5030 Unspecified diastolic (congestive) heart failure: Secondary | ICD-10-CM | POA: Diagnosis not present

## 2018-10-14 DIAGNOSIS — N186 End stage renal disease: Secondary | ICD-10-CM | POA: Diagnosis not present

## 2018-10-14 DIAGNOSIS — I82621 Acute embolism and thrombosis of deep veins of right upper extremity: Secondary | ICD-10-CM

## 2018-10-14 DIAGNOSIS — I132 Hypertensive heart and chronic kidney disease with heart failure and with stage 5 chronic kidney disease, or end stage renal disease: Secondary | ICD-10-CM | POA: Diagnosis not present

## 2018-10-14 DIAGNOSIS — Z5181 Encounter for therapeutic drug level monitoring: Secondary | ICD-10-CM

## 2018-10-14 DIAGNOSIS — Z7901 Long term (current) use of anticoagulants: Secondary | ICD-10-CM

## 2018-10-14 DIAGNOSIS — I82A11 Acute embolism and thrombosis of right axillary vein: Secondary | ICD-10-CM | POA: Diagnosis not present

## 2018-10-14 DIAGNOSIS — J181 Lobar pneumonia, unspecified organism: Secondary | ICD-10-CM | POA: Diagnosis not present

## 2018-10-14 LAB — POCT INR: INR: 6.1 — AB (ref 2.0–3.0)

## 2018-10-14 NOTE — Progress Notes (Addendum)
Anticoagulation Management Nancy [name] is a 78 y.o. female who was contacted for warfarin management. Spoke to Nancy [name], patient's granddaughter, who assists at home in her care.  Indication: RUE DVT Duration: indefinite Supervising physician: Aldine Contes  Anticoagulation Clinic Visit History: Patient does not report signs/symptoms of bleeding or thromboembolism. Patient is not currently taking enoxaparin.  Anticoagulation Episode Summary    Current INR goal:   2.0-3.0  TTR:   -  Next INR check:   10/28/2018  INR from last check:   6.1! (10/14/2018)  Weekly max warfarin dose:     Target end date:     INR check location:     Preferred lab:     Send INR reminders to:      Indications   Acute deep vein thrombosis (DVT) of axillary vein of right upper extremity (HCC) Nancy [name]]       Comments:          Allergies  Allergen Reactions  . Hydrocodone Nausea And Vomiting and Other (See Comments)    Dizziness, also  . Tape Other (See Comments)    TAPE LEAVES DARK PATCHES ON THE SKIN!!   Medication Sig  acetaminophen (TYLENOL) 325 MG tablet Take 2 tablets (650 mg total) by mouth 2 (two) times daily as needed for mild pain, moderate pain, fever or headache.  aspirin 81 MG EC tablet Take 1 tablet (81 mg total) by mouth daily.  calcitRIOL (ROCALTROL) 0.5 MCG capsule Take 4 capsules (2 mcg total) by mouth every Monday, Wednesday, and Friday with hemodialysis. Patient taking differently: Take 2 mcg by mouth See admin instructions. Take 4 tablet (1mcg totally) by mouth on Tues, Thur, and Sat  dextrose (GLUTOSE) 40 % GEL Take 37.5 g by mouth once as needed for low blood sugar.  diphenhydrAMINE-zinc acetate (BENADRYL) cream Apply topically 3 (three) times daily as needed for itching. Patient not taking: Reported on 06/23/2018  enoxaparin (LOVENOX) 60 MG/0.6ML injection Inject 0.5 mLs (50 mg total) into the skin daily for 3 days.  latanoprost (XALATAN) 0.005 % ophthalmic solution Place 1  drop into both eyes at bedtime.  memantine (NAMENDA) 5 MG tablet Take 5 mg by mouth 2 (two) times daily.  midodrine (PROAMATINE) 10 MG tablet Take 1 tablet (10 mg total) by mouth daily. Take on dialysis days. Patient taking differently: Take 10 mg by mouth See admin instructions. Take 1 tablet (10 mg totally) by mouth on Tues, Thur, and Sat  mirtazapine (REMERON) 15 MG tablet Take 15 mg by mouth at bedtime.  multivitamin (RENA-VIT) TABS tablet Take 1 tablet by mouth at bedtime. Patient not taking: Reported on 10/03/2018  Nutritional Supplements (FEEDING SUPPLEMENT, NEPRO CARB STEADY,) LIQD Take 237 mLs by mouth 2 (two) times daily between meals for 30 days.  OLANZapine (ZYPREXA) 2.5 MG tablet Take 1 tablet (2.5 mg total) by mouth at bedtime.  ondansetron (ZOFRAN-ODT) 4 MG disintegrating tablet Take 1 tablet (4 mg total) by mouth 2 (two) times daily as needed for nausea or vomiting.  OVER THE COUNTER MEDICATION Apply 1 application topically See admin instructions. Biofreeze 5% gel topically, apply to back, area of pain three times daily  oxyCODONE-acetaminophen (PERCOCET) 5-325 MG tablet Take 0.5-1 tablets by mouth every 4 (four) hours as needed. Patient taking differently: Take 1 tablet by mouth 2 (two) times daily as needed for moderate pain.   senna-docusate (SENOKOT-S) 8.6-50 MG tablet Take 1 tablet by mouth at bedtime as needed for mild constipation. Patient taking differently: Take 1 tablet  by mouth at bedtime.   warfarin (COUMADIN) 2.5 MG tablet TAKE 1 TABLET BY MOUTH EVERY DAY AT 6 PM   Past Medical History:  Diagnosis Date  . Anemia   . Aortic stenosis   . Bacterial sinusitis 09/17/2011  . CHF (congestive heart failure) ([place])   . CKD (chronic kidney disease) stage 4, GFR 15-29 ml/min ([place]) 08/11/2006   Cr continues to increase. Proteinuria on UA 02/10/12.    . Colitis   . CVA (cerebrovascular accident) [facility])    New hemorrhagic per CT scan '09  . Diverticulosis of colon   .  Dysfunctional uterine bleeding   . ESRD (end stage renal disease) on dialysis ([place])    "MWF; [facility]" (11/27/2017)  . Fecal impaction ([place])   . Headache(784.0)   . Heart murmur   . HERNIORRHAPHY, HX OF 08/11/2006  . Hypertension   . OA (osteoarthritis)    bilateral knees  . Postmenopausal   . Pulmonary nodule   . TINEA CRURIS 01/12/2007   Social History   Socioeconomic History  . Marital status: Widowed    Spouse name: Not on file  . Number of children: Not on file  . Years of education: Not on file  . Highest education level: Not on file  Occupational History  . Not on file  Social Needs  . Financial resource strain: Not on file  . Food insecurity:    Worry: Never true    Inability: Never true  . Transportation needs:    Medical: No    Non-medical: No  Tobacco Use  . Smoking status: Never Smoker  . Smokeless tobacco: Never Used  Substance and Sexual Activity  . Alcohol use: Not on file  . Drug use: No    Comment: 08/15/08 UDS + cocaine  . Sexual activity: Not Currently  Lifestyle  . Physical activity:    Days per week: 0 days    Minutes per session: 0 min  . Stress: Not on file  Relationships  . Social connections:    Talks on phone: More than three times a week    Gets together: More than three times a week    Attends religious service: More than 4 times per year    Active member of club or organization: No    Attends meetings of clubs or organizations: Never    Relationship status: Widowed  Other Topics Concern  . Not on file  Social History Narrative   Member is widowed, has 1 daughter and 1 granddaughter and 3 great grandchildren      Family History  Problem Relation Age of Onset  . Hypertension Mother   . Congestive Heart Failure Mother   . Heart attack Brother 69   ASSESSMENT Lab Results  Component Value Date   INR 6.1 (A) 10/14/2018   INR 1.4 (H) 10/07/2018   INR 1.13 03/29/2018   Anticoagulation Dosing: 2.5 mg daily   INR today:  Supratherapeutic  PLAN Hold warfarin, re-check INR in 2 days. Patient advised to contact clinic or seek medical attention if signs/symptoms of bleeding or thromboembolism occur. Order was placed for [facility] home [facility] to check INR Friday and call me with results.  Patient verbalized understanding by repeating back information and was advised to contact me if further medication-related questions arise. Patient was also provided an information handout.  Follow-up Return in about 2 days (around 10/17/2018).  Flossie Dibble

## 2018-10-14 NOTE — Telephone Encounter (Signed)
Completed and signed Request for Independent Assessment for [place] [facility] Medical Need faxed to [place] at [phone]. London Pepper, Camren Lipsett C4/8/202012:22 PM

## 2018-10-14 NOTE — Telephone Encounter (Signed)
Thank you :)

## 2018-10-15 ENCOUNTER — Other Ambulatory Visit: Payer: Self-pay | Admitting: *Deleted

## 2018-10-15 ENCOUNTER — Other Ambulatory Visit: Payer: Self-pay

## 2018-10-15 NOTE — Telephone Encounter (Signed)
Thank you. Will follow up Dr. Julianne Rice recommendations

## 2018-10-15 NOTE — Telephone Encounter (Signed)
INTERNAL MEDICINE TEACHING ATTENDING ADDENDUM - Erwin Nishiyama M.D  °Duration- indefinite, Indication- DVT, INR- supratherapeutic. Agree with pharmacy recommendations as outlined in their note.  ° ° ° °

## 2018-10-15 NOTE — Telephone Encounter (Addendum)
Received faxed report from [place] of patient's PT/INR collected 10/13/2018 with result of 4.8. Patient has already had repeat PT/INR that has been addressed by Dr. Maudie Mercury. Placed in Dr. Julianne Rice box for review and signature. Hubbard Hartshorn, RN, BSN

## 2018-10-15 NOTE — Patient Outreach (Signed)
Nancy [name]) Care Management  10/15/2018  Nancy [name] 1940/07/14 [id number]   Call placed to [facility] caregiver/daughter Nancy [name] to follow up on member's status.  She state member did not go to dialysis today.  Per MD note, she also didn't go on Saturday.  Daughter report she went on Tuesday but only had the treatment for 1 hour.  This care manager inquired about member's continuation with dialysis and palliative care involvement with [facility].  Confirms nurse from [facility] did make home visit and member now involved with their program.  Daughter does report she would like more support in the home.  Denies any other concerns at this time.  Will request for social worker to provide resources.   Will close case at this time due to involvement with [facility].  Nancy [name], [place], MSN [place] [phone]

## 2018-10-15 NOTE — Telephone Encounter (Signed)
Thank you! I have advised patient/family to hold warfarin for now and will re-check INR tomorrow.

## 2018-10-16 ENCOUNTER — Encounter (HOSPITAL_COMMUNITY): Payer: Self-pay | Admitting: Emergency Medicine

## 2018-10-16 ENCOUNTER — Inpatient Hospital Stay (HOSPITAL_COMMUNITY)
Admission: EM | Admit: 2018-10-16 | Discharge: 2018-11-06 | DRG: 917 | Disposition: E | Payer: Medicare Other | Attending: Internal Medicine | Admitting: Internal Medicine

## 2018-10-16 ENCOUNTER — Emergency Department (HOSPITAL_COMMUNITY): Payer: Medicare Other

## 2018-10-16 ENCOUNTER — Other Ambulatory Visit: Payer: Self-pay

## 2018-10-16 DIAGNOSIS — R791 Abnormal coagulation profile: Secondary | ICD-10-CM | POA: Diagnosis not present

## 2018-10-16 DIAGNOSIS — N2581 Secondary hyperparathyroidism of renal origin: Secondary | ICD-10-CM | POA: Diagnosis present

## 2018-10-16 DIAGNOSIS — K5641 Fecal impaction: Secondary | ICD-10-CM | POA: Diagnosis present

## 2018-10-16 DIAGNOSIS — Z7901 Long term (current) use of anticoagulants: Secondary | ICD-10-CM | POA: Diagnosis not present

## 2018-10-16 DIAGNOSIS — I499 Cardiac arrhythmia, unspecified: Secondary | ICD-10-CM | POA: Diagnosis not present

## 2018-10-16 DIAGNOSIS — Z992 Dependence on renal dialysis: Secondary | ICD-10-CM | POA: Diagnosis not present

## 2018-10-16 DIAGNOSIS — D500 Iron deficiency anemia secondary to blood loss (chronic): Secondary | ICD-10-CM | POA: Diagnosis present

## 2018-10-16 DIAGNOSIS — I440 Atrioventricular block, first degree: Secondary | ICD-10-CM | POA: Diagnosis not present

## 2018-10-16 DIAGNOSIS — Z79899 Other long term (current) drug therapy: Secondary | ICD-10-CM

## 2018-10-16 DIAGNOSIS — Z6820 Body mass index (BMI) 20.0-20.9, adult: Secondary | ICD-10-CM

## 2018-10-16 DIAGNOSIS — Z8673 Personal history of transient ischemic attack (TIA), and cerebral infarction without residual deficits: Secondary | ICD-10-CM | POA: Diagnosis not present

## 2018-10-16 DIAGNOSIS — Z885 Allergy status to narcotic agent status: Secondary | ICD-10-CM

## 2018-10-16 DIAGNOSIS — I469 Cardiac arrest, cause unspecified: Secondary | ICD-10-CM | POA: Diagnosis present

## 2018-10-16 DIAGNOSIS — R011 Cardiac murmur, unspecified: Secondary | ICD-10-CM

## 2018-10-16 DIAGNOSIS — F0390 Unspecified dementia without behavioral disturbance: Secondary | ICD-10-CM | POA: Diagnosis present

## 2018-10-16 DIAGNOSIS — Y95 Nosocomial condition: Secondary | ICD-10-CM | POA: Diagnosis present

## 2018-10-16 DIAGNOSIS — Z8249 Family history of ischemic heart disease and other diseases of the circulatory system: Secondary | ICD-10-CM | POA: Diagnosis not present

## 2018-10-16 DIAGNOSIS — I5032 Chronic diastolic (congestive) heart failure: Secondary | ICD-10-CM | POA: Diagnosis present

## 2018-10-16 DIAGNOSIS — R627 Adult failure to thrive: Secondary | ICD-10-CM | POA: Diagnosis present

## 2018-10-16 DIAGNOSIS — Z515 Encounter for palliative care: Secondary | ICD-10-CM | POA: Diagnosis present

## 2018-10-16 DIAGNOSIS — E785 Hyperlipidemia, unspecified: Secondary | ICD-10-CM | POA: Diagnosis present

## 2018-10-16 DIAGNOSIS — I959 Hypotension, unspecified: Secondary | ICD-10-CM | POA: Diagnosis present

## 2018-10-16 DIAGNOSIS — Z86718 Personal history of other venous thrombosis and embolism: Secondary | ICD-10-CM

## 2018-10-16 DIAGNOSIS — G062 Extradural and subdural abscess, unspecified: Secondary | ICD-10-CM | POA: Diagnosis present

## 2018-10-16 DIAGNOSIS — D631 Anemia in chronic kidney disease: Secondary | ICD-10-CM | POA: Diagnosis present

## 2018-10-16 DIAGNOSIS — E43 Unspecified severe protein-calorie malnutrition: Secondary | ICD-10-CM | POA: Diagnosis present

## 2018-10-16 DIAGNOSIS — R911 Solitary pulmonary nodule: Secondary | ICD-10-CM | POA: Diagnosis present

## 2018-10-16 DIAGNOSIS — I132 Hypertensive heart and chronic kidney disease with heart failure and with stage 5 chronic kidney disease, or end stage renal disease: Secondary | ICD-10-CM

## 2018-10-16 DIAGNOSIS — Z66 Do not resuscitate: Secondary | ICD-10-CM

## 2018-10-16 DIAGNOSIS — I82621 Acute embolism and thrombosis of deep veins of right upper extremity: Secondary | ICD-10-CM | POA: Diagnosis not present

## 2018-10-16 DIAGNOSIS — M4625 Osteomyelitis of vertebra, thoracolumbar region: Secondary | ICD-10-CM | POA: Diagnosis present

## 2018-10-16 DIAGNOSIS — Z9115 Patient's noncompliance with renal dialysis: Secondary | ICD-10-CM

## 2018-10-16 DIAGNOSIS — R000 Tachycardia, unspecified: Secondary | ICD-10-CM | POA: Diagnosis not present

## 2018-10-16 DIAGNOSIS — D539 Nutritional anemia, unspecified: Secondary | ICD-10-CM | POA: Diagnosis present

## 2018-10-16 DIAGNOSIS — I5020 Unspecified systolic (congestive) heart failure: Secondary | ICD-10-CM | POA: Diagnosis not present

## 2018-10-16 DIAGNOSIS — M170 Bilateral primary osteoarthritis of knee: Secondary | ICD-10-CM | POA: Diagnosis present

## 2018-10-16 DIAGNOSIS — D649 Anemia, unspecified: Secondary | ICD-10-CM | POA: Diagnosis not present

## 2018-10-16 DIAGNOSIS — J9811 Atelectasis: Secondary | ICD-10-CM | POA: Diagnosis not present

## 2018-10-16 DIAGNOSIS — S40022A Contusion of left upper arm, initial encounter: Secondary | ICD-10-CM | POA: Diagnosis present

## 2018-10-16 DIAGNOSIS — R918 Other nonspecific abnormal finding of lung field: Secondary | ICD-10-CM | POA: Diagnosis not present

## 2018-10-16 DIAGNOSIS — T45511A Poisoning by anticoagulants, accidental (unintentional), initial encounter: Secondary | ICD-10-CM | POA: Diagnosis present

## 2018-10-16 DIAGNOSIS — K5730 Diverticulosis of large intestine without perforation or abscess without bleeding: Secondary | ICD-10-CM | POA: Diagnosis present

## 2018-10-16 DIAGNOSIS — H409 Unspecified glaucoma: Secondary | ICD-10-CM | POA: Diagnosis present

## 2018-10-16 DIAGNOSIS — Z9889 Other specified postprocedural states: Secondary | ICD-10-CM | POA: Diagnosis not present

## 2018-10-16 DIAGNOSIS — I120 Hypertensive chronic kidney disease with stage 5 chronic kidney disease or end stage renal disease: Secondary | ICD-10-CM | POA: Diagnosis not present

## 2018-10-16 DIAGNOSIS — K219 Gastro-esophageal reflux disease without esophagitis: Secondary | ICD-10-CM | POA: Diagnosis present

## 2018-10-16 DIAGNOSIS — R079 Chest pain, unspecified: Secondary | ICD-10-CM | POA: Diagnosis present

## 2018-10-16 DIAGNOSIS — N186 End stage renal disease: Secondary | ICD-10-CM | POA: Diagnosis present

## 2018-10-16 DIAGNOSIS — X58XXXA Exposure to other specified factors, initial encounter: Secondary | ICD-10-CM | POA: Diagnosis not present

## 2018-10-16 DIAGNOSIS — R0902 Hypoxemia: Secondary | ICD-10-CM | POA: Diagnosis not present

## 2018-10-16 DIAGNOSIS — D696 Thrombocytopenia, unspecified: Secondary | ICD-10-CM | POA: Diagnosis present

## 2018-10-16 DIAGNOSIS — E8889 Other specified metabolic disorders: Secondary | ICD-10-CM | POA: Diagnosis present

## 2018-10-16 DIAGNOSIS — L8990 Pressure ulcer of unspecified site, unspecified stage: Secondary | ICD-10-CM | POA: Diagnosis present

## 2018-10-16 DIAGNOSIS — M79622 Pain in left upper arm: Secondary | ICD-10-CM | POA: Diagnosis not present

## 2018-10-16 DIAGNOSIS — N289 Disorder of kidney and ureter, unspecified: Secondary | ICD-10-CM

## 2018-10-16 DIAGNOSIS — J90 Pleural effusion, not elsewhere classified: Secondary | ICD-10-CM | POA: Diagnosis not present

## 2018-10-16 DIAGNOSIS — Z91048 Other nonmedicinal substance allergy status: Secondary | ICD-10-CM

## 2018-10-16 DIAGNOSIS — Z7982 Long term (current) use of aspirin: Secondary | ICD-10-CM

## 2018-10-16 DIAGNOSIS — I350 Nonrheumatic aortic (valve) stenosis: Secondary | ICD-10-CM | POA: Diagnosis present

## 2018-10-16 LAB — COMPREHENSIVE METABOLIC PANEL
ALT: 8 U/L (ref 0–44)
AST: 13 U/L — ABNORMAL LOW (ref 15–41)
Albumin: 1.3 g/dL — ABNORMAL LOW (ref 3.5–5.0)
Alkaline Phosphatase: 66 U/L (ref 38–126)
Anion gap: 11 (ref 5–15)
BUN: 42 mg/dL — ABNORMAL HIGH (ref 8–23)
CO2: 24 mmol/L (ref 22–32)
Calcium: 7.2 mg/dL — ABNORMAL LOW (ref 8.9–10.3)
Chloride: 104 mmol/L (ref 98–111)
Creatinine, Ser: 5.13 mg/dL — ABNORMAL HIGH (ref 0.44–1.00)
GFR calc Af Amer: 9 mL/min — ABNORMAL LOW (ref >60)
GFR calc non Af Amer: 8 mL/min — ABNORMAL LOW (ref >60)
Glucose, Bld: 125 mg/dL — ABNORMAL HIGH (ref 70–99)
Potassium: 3.4 mmol/L — ABNORMAL LOW (ref 3.5–5.1)
Sodium: 139 mmol/L (ref 135–145)
Total Bilirubin: 0.3 mg/dL (ref 0.3–1.2)
Total Protein: 3.5 g/dL — ABNORMAL LOW (ref 6.5–8.1)

## 2018-10-16 LAB — CBC
HCT: 23.3 % — ABNORMAL LOW (ref 36.0–46.0)
HCT: 37.7 % (ref 36.0–46.0)
HCT: 22.7 % — ABNORMAL LOW (ref 36.0–46.0)
Hemoglobin: 6.9 g/dL — CL (ref 12.0–15.0)
Hemoglobin: 11.1 g/dL — ABNORMAL LOW (ref 12.0–15.0)
Hemoglobin: 7.2 g/dL — ABNORMAL LOW (ref 12.0–15.0)
MCH: 27 pg (ref 26.0–34.0)
MCH: 27.8 pg (ref 26.0–34.0)
MCH: 28.1 pg (ref 26.0–34.0)
MCHC: 29.6 g/dL — ABNORMAL LOW (ref 30.0–36.0)
MCHC: 29.4 g/dL — ABNORMAL LOW (ref 30.0–36.0)
MCHC: 31.7 g/dL (ref 30.0–36.0)
MCV: 91 fL (ref 80.0–100.0)
MCV: 94.5 fL (ref 80.0–100.0)
MCV: 88.7 fL (ref 80.0–100.0)
Platelets: 87 10*3/uL — ABNORMAL LOW (ref 150–400)
Platelets: 96 10*3/uL — ABNORMAL LOW (ref 150–400)
Platelets: 68 10*3/uL — ABNORMAL LOW (ref 150–400)
RBC: 2.56 MIL/uL — ABNORMAL LOW (ref 3.87–5.11)
RBC: 3.99 MIL/uL (ref 3.87–5.11)
RBC: 2.56 MIL/uL — ABNORMAL LOW (ref 3.87–5.11)
RDW: 20 % — ABNORMAL HIGH (ref 11.5–15.5)
RDW: 17.6 % — ABNORMAL HIGH (ref 11.5–15.5)
RDW: 17.8 % — ABNORMAL HIGH (ref 11.5–15.5)
WBC: 4.3 10*3/uL (ref 4.0–10.5)
WBC: 7.9 10*3/uL (ref 4.0–10.5)
WBC: 6.4 10*3/uL (ref 4.0–10.5)
nRBC: 0 % (ref 0.0–0.2)
nRBC: 0 % (ref 0.0–0.2)
nRBC: 0 % (ref 0.0–0.2)

## 2018-10-16 LAB — PROTIME-INR
INR: 5.4 (ref 0.8–1.2)
INR: 1.2 (ref 0.8–1.2)
INR: 1.2 (ref 0.8–1.2)
Prothrombin Time: 48.1 seconds — ABNORMAL HIGH (ref 11.4–15.2)
Prothrombin Time: 14.7 seconds (ref 11.4–15.2)
Prothrombin Time: 14.6 seconds (ref 11.4–15.2)

## 2018-10-16 LAB — PREPARE RBC (CROSSMATCH)

## 2018-10-16 LAB — TROPONIN I: Troponin I: <0.03 ng/mL (ref <0.03)

## 2018-10-16 MED ORDER — ACETAMINOPHEN 325 MG PO TABS
650.0000 mg | ORAL_TABLET | Freq: Two times a day (BID) | ORAL | Status: DC | PRN
  Administered 2018-10-18: 650 mg via ORAL
  Filled 2018-10-16: qty 2

## 2018-10-16 MED ORDER — IOHEXOL 300 MG/ML  SOLN
75.0000 mL | Freq: Once | INTRAMUSCULAR | Status: AC | PRN
  Administered 2018-10-16: 05:00:00 75 mL via INTRAVENOUS

## 2018-10-16 MED ORDER — ONDANSETRON 4 MG PO TBDP: 4.0000 mg | ORAL_TABLET | Freq: Two times a day (BID) | ORAL | Status: DC | PRN

## 2018-10-16 MED ORDER — PROTHROMBIN COMPLEX CONC HUMAN 500 UNITS IV KIT
2303.0000 [IU] | PACK | Status: AC
  Administered 2018-10-16: 2303 [IU] via INTRAVENOUS
  Filled 2018-10-16: qty 2303

## 2018-10-16 MED ORDER — MIDODRINE HCL 5 MG PO TABS: 10.0000 mg | ORAL_TABLET | Freq: Once | ORAL | Status: DC

## 2018-10-16 MED ORDER — CHLORHEXIDINE GLUCONATE CLOTH 2 % EX PADS
6.0000 | MEDICATED_PAD | Freq: Every day | CUTANEOUS | Status: DC
  Administered 2018-10-18: 06:00:00 6 via TOPICAL

## 2018-10-16 MED ORDER — MIDODRINE HCL 5 MG PO TABS
10.0000 mg | ORAL_TABLET | ORAL | Status: DC
  Administered 2018-10-17 – 2018-10-20 (×2): 10 mg via ORAL
  Filled 2018-10-16 (×3): qty 2

## 2018-10-16 MED ORDER — MIDODRINE HCL 5 MG PO TABS
ORAL_TABLET | ORAL | Status: AC
  Filled 2018-10-16: qty 2

## 2018-10-16 MED ORDER — MEMANTINE HCL 10 MG PO TABS
5.0000 mg | ORAL_TABLET | Freq: Two times a day (BID) | ORAL | Status: DC
  Administered 2018-10-16 – 2018-10-19 (×8): 5 mg via ORAL
  Filled 2018-10-16 (×10): qty 1

## 2018-10-16 MED ORDER — SENNOSIDES-DOCUSATE SODIUM 8.6-50 MG PO TABS
1.0000 | ORAL_TABLET | Freq: Every day | ORAL | Status: DC
  Administered 2018-10-16 – 2018-10-19 (×3): 1 via ORAL
  Filled 2018-10-16 (×3): qty 1

## 2018-10-16 MED ORDER — OLANZAPINE 2.5 MG PO TABS
2.5000 mg | ORAL_TABLET | Freq: Every day | ORAL | Status: DC
  Administered 2018-10-16 – 2018-10-19 (×4): 2.5 mg via ORAL
  Filled 2018-10-16 (×5): qty 1

## 2018-10-16 MED ORDER — FENTANYL CITRATE (PF) 100 MCG/2ML IJ SOLN
50.0000 ug | Freq: Once | INTRAMUSCULAR | Status: AC
  Administered 2018-10-16: 50 ug via INTRAVENOUS
  Filled 2018-10-16: qty 2

## 2018-10-16 MED ORDER — VITAMIN K1 10 MG/ML IJ SOLN
10.0000 mg | Freq: Once | INTRAVENOUS | Status: AC
  Administered 2018-10-16: 07:00:00 10 mg via INTRAVENOUS
  Filled 2018-10-16: qty 1

## 2018-10-16 MED ORDER — SODIUM CHLORIDE 0.9 % IV BOLUS
500.0000 mL | Freq: Once | INTRAVENOUS | Status: AC
  Administered 2018-10-16: 500 mL via INTRAVENOUS

## 2018-10-16 MED ORDER — MIRTAZAPINE 15 MG PO TABS
15.0000 mg | ORAL_TABLET | Freq: Every day | ORAL | Status: DC
  Administered 2018-10-16 – 2018-10-19 (×4): 15 mg via ORAL
  Filled 2018-10-16 (×4): qty 1

## 2018-10-16 MED ORDER — CALCITRIOL 0.25 MCG PO CAPS
2.0000 ug | ORAL_CAPSULE | ORAL | Status: DC
  Administered 2018-10-16 – 2018-10-20 (×2): 2 ug via ORAL
  Filled 2018-10-16: qty 4

## 2018-10-16 MED ORDER — ALBUMIN HUMAN 25 % IV SOLN
INTRAVENOUS | Status: AC
  Administered 2018-10-16: 19:00:00 25 g
  Filled 2018-10-16: qty 100

## 2018-10-16 MED ORDER — SODIUM CHLORIDE 0.9% IV SOLUTION: Freq: Once | INTRAVENOUS | Status: DC

## 2018-10-16 MED ORDER — NEPRO/CARBSTEADY PO LIQD
237.0000 mL | Freq: Two times a day (BID) | ORAL | Status: DC
  Administered 2018-10-19: 10:00:00 237 mL via ORAL

## 2018-10-16 MED ORDER — HYDROMORPHONE HCL 1 MG/ML IJ SOLN
0.5000 mg | INTRAMUSCULAR | Status: DC | PRN
  Administered 2018-10-16: 0.5 mg via INTRAVENOUS
  Filled 2018-10-16: qty 1

## 2018-10-16 NOTE — Progress Notes (Addendum)
Patient's BP 75/39 (52) HR 99. Patient asleep and asymptomatic. When aroused and bed let up, patient yelled at me to let the bed down. Patient yelled "none of your business how I feel"   MD paged via [place]. Per MD Now new orders continue to monitor.

## 2018-10-16 NOTE — ED Notes (Signed)
Central line placed by MD. Pt transported back to CT

## 2018-10-16 NOTE — Consult Note (Signed)
[facility] Renal Consultation Note    Indication for Consultation:  Management of ESRD/hemodialysis; anemia, hypertension/volume and secondary hyperparathyroidism PCP:  HPI: Nancy [name] is a 78 y.o. female with ESRD on hemodialysis T,Th,S at [facility]. PMH HTN, CVA, R DVT on coumadin, T12-L1 osteo with epidural abscess, AS, diverticulosis, dementia, AOCD, SHPT, marked noncompliance of with HD. Has had 1 hr 16 minutes of treatment since 10/03/18.   Patient presented to ED this AM with C/O L axilla pain. HGB 6.9 INR 6.1 upon arrival to ED. CT of chest revealed 9.5 x 9.5 x 7.0 cm well-circumscribed collection in the left axilla with layering hyperdense material, likely to represent a postprocedural hematoma. She has been admitted for L axillary hematoma/supratherapeutic INR. Has rec'd KCENTRA. Has been tranfused with 1 unit of PRBCs.   Seen in room, patient says she is at daughter's house but doesn't know which one. Oriented to self only. Can tell me that the swelling under her arm started last night and that area is exquisitely painful. No family members available to assist with HPI. Rest of HPI gathered from EMR.   Past Medical History:  Diagnosis Date  . Anemia   . Aortic stenosis   . Bacterial sinusitis 09/17/2011  . CHF (congestive heart failure) ([place])   . CKD (chronic kidney disease) stage 4, GFR 15-29 ml/min ([place]) 08/11/2006   Cr continues to increase. Proteinuria on UA 02/10/12.    . Colitis   . CVA (cerebrovascular accident) [facility])    New hemorrhagic per CT scan '09  . Diverticulosis of colon   . Dysfunctional uterine bleeding   . ESRD (end stage renal disease) on dialysis ([place])    "MWF; [facility]" (11/27/2017)  . Fecal impaction ([place])   . Headache(784.0)   . Heart murmur   . HERNIORRHAPHY, HX OF 08/11/2006  . Hypertension   . OA (osteoarthritis)    bilateral knees  . Postmenopausal   . Pulmonary nodule   . TINEA CRURIS 01/12/2007   Past  Surgical History:  Procedure Laterality Date  . ABDOMINAL HYSTERECTOMY    . AV FISTULA PLACEMENT Left 02/19/2017   Procedure: CREATION OF LEFT ARM BRACHIOCEPHALIC ARTERIOVENOUS (AV) FISTULA;  Surgeon: Rosetta Posner, MD;  Location: [place];  Service: Vascular;  Laterality: Left;  . BASCILIC VEIN TRANSPOSITION Left 04/23/2017   Procedure: BASILIC VEIN TRANSPOSITION SECOND STAGE;  Surgeon: Rosetta Posner, MD;  Location: [place];  Service: Vascular;  Laterality: Left;  . BIOPSY  02/20/2018   Procedure: BIOPSY;  Surgeon: Carol Ada, MD;  Location: [facility];  Service: Endoscopy;;  . BIOPSY  03/07/2018   Procedure: BIOPSY;  Surgeon: Carol Ada, MD;  Location: [facility] ENDOSCOPY;  Service: Endoscopy;;  . CHOLECYSTECTOMY  2009  . COLONOSCOPY    . ESOPHAGOGASTRODUODENOSCOPY N/A 03/07/2018   Procedure: ESOPHAGOGASTRODUODENOSCOPY (EGD);  Surgeon: Carol Ada, MD;  Location: [place];  Service: Endoscopy;  Laterality: N/A;  . ESOPHAGOGASTRODUODENOSCOPY (EGD) WITH PROPOFOL N/A 02/20/2018   Procedure: ESOPHAGOGASTRODUODENOSCOPY (EGD) WITH PROPOFOL;  Surgeon: Carol Ada, MD;  Location: [facility] ENDOSCOPY;  Service: Endoscopy;  Laterality: N/A;  . INGUINAL HERNIA REPAIR  2008  . INSERTION OF DIALYSIS CATHETER Right 02/19/2017   Procedure: INSERTION OF TUNNELED DIALYSIS CATHETER - RIGHT INTERNAL JUGULAR PLACEMENT;  Surgeon: Rosetta Posner, MD;  Location: [place];  Service: Vascular;  Laterality: Right;  . IRIDOTOMY / IRIDECTOMY     Laser, right eye 12/26/11 left eye 01/24/12  . MASS EXCISION Left 05/07/2013  Procedure: EXCISION CYST;  Surgeon: Myrtha Mantis., MD;  Location: [facility];  Service: Ophthalmology;  Laterality: Left;  . SAVORY DILATION N/A 02/20/2018   Procedure: SAVORY DILATION;  Surgeon: Carol Ada, MD;  Location: [facility] ENDOSCOPY;  Service: Endoscopy;  Laterality: N/A;   Family History  Problem Relation Age of Onset  . Hypertension Mother   . Congestive Heart Failure Mother    . Heart attack Brother 67   Social History:  reports that she has never smoked. She has never used smokeless tobacco. She reports that she does not use drugs. No history on file for alcohol. Allergies  Allergen Reactions  . Hydrocodone Nausea And Vomiting and Other (See Comments)    Dizziness, also  . Tape Other (See Comments)    TAPE LEAVES DARK PATCHES ON THE SKIN!!   Prior to Admission medications   Medication Sig Start Date End Date Taking? Authorizing Provider  acetaminophen (TYLENOL) 325 MG tablet Take 2 tablets (650 mg total) by mouth 2 (two) times daily as needed for mild pain, moderate pain, fever or headache. 10/07/18   Carroll Sage, MD  aspirin 81 MG EC tablet Take 1 tablet (81 mg total) by mouth daily. 05/14/18   Masoudi, Dorthula Rue, MD  calcitRIOL (ROCALTROL) 0.5 MCG capsule Take 4 capsules (2 mcg total) by mouth every Monday, Wednesday, and Friday with hemodialysis. Patient taking differently: Take 2 mcg by mouth See admin instructions. Take 4 tablet (59mcg totally) by mouth on Harrell Lark, and Sat 12/30/17   Neva Seat, MD  dextrose (GLUTOSE) 40 % GEL Take 37.5 g by mouth once as needed for low blood sugar. 10/07/18   Carroll Sage, MD  diphenhydrAMINE-zinc acetate (BENADRYL) cream Apply topically 3 (three) times daily as needed for itching. Patient not taking: Reported on 06/23/2018 04/20/18   Masoudi, Dorthula Rue, MD  enoxaparin (LOVENOX) 60 MG/0.6ML injection Inject 0.5 mLs (50 mg total) into the skin daily for 3 days. 10/07/18 10/10/18  Carroll Sage, MD  latanoprost (XALATAN) 0.005 % ophthalmic solution Place 1 drop into both eyes at bedtime. 04/11/18   [provider]  memantine (NAMENDA) 5 MG tablet Take 5 mg by mouth 2 (two) times daily.    [provider]  midodrine (PROAMATINE) 10 MG tablet Take 1 tablet (10 mg total) by mouth daily. Take on dialysis days. Patient taking differently: Take 10 mg by mouth See admin instructions. Take 1 tablet (10  mg totally) by mouth on Harrell Lark, and Sat 07/29/18   Dorrell, [name], MD  mirtazapine (REMERON) 15 MG tablet Take 15 mg by mouth at bedtime. 09/15/18   [provider]  multivitamin (RENA-VIT) TABS tablet Take 1 tablet by mouth at bedtime. Patient not taking: Reported on 10/03/2018 07/29/18   Dorrell, [name], MD  Nutritional Supplements (FEEDING SUPPLEMENT, NEPRO CARB STEADY,) LIQD Take 237 mLs by mouth 2 (two) times daily between meals for 30 days. 10/08/18 11/07/18  Carroll Sage, MD  OLANZapine (ZYPREXA) 2.5 MG tablet Take 1 tablet (2.5 mg total) by mouth at bedtime. 07/29/18   Dorrell, [name], MD  ondansetron (ZOFRAN-ODT) 4 MG disintegrating tablet Take 1 tablet (4 mg total) by mouth 2 (two) times daily as needed for nausea or vomiting. 10/07/18   Carroll Sage, MD  OVER THE COUNTER MEDICATION Apply 1 application topically See admin instructions. Biofreeze 5% gel topically, apply to back, area of pain three times daily    [provider]  oxyCODONE-acetaminophen (PERCOCET)  5-325 MG tablet Take 0.5-1 tablets by mouth every 4 (four) hours as needed. Patient taking differently: Take 1 tablet by mouth 2 (two) times daily as needed for moderate pain.  05/14/18   Masoudi, Elhamalsadat, MD  senna-docusate (SENOKOT-S) 8.6-50 MG tablet Take 1 tablet by mouth at bedtime as needed for mild constipation. Patient taking differently: Take 1 tablet by mouth at bedtime.  05/14/18   Masoudi, Dorthula Rue, MD  warfarin (COUMADIN) 2.5 MG tablet TAKE 1 TABLET BY MOUTH EVERY DAY AT 6 PM 10/09/18   Aldine Contes, MD   Current Facility-Administered Medications  Medication Dose Route Frequency Provider Last Rate Last Dose  . 0.9 %  sodium chloride infusion (Manually program via Guardrails IV Fluids)   Intravenous Once Ina Homes, MD      . acetaminophen (TYLENOL) tablet 650 mg  650 mg Oral BID PRN Ina Homes, MD      . calcitRIOL (ROCALTROL) capsule 2 mcg  2 mcg Oral Q M,W,F-HD Helberg,  Justin, MD      . feeding supplement (NEPRO CARB STEADY) liquid 237 mL  237 mL Oral BID BM Helberg, Justin, MD      . HYDROmorphone (DILAUDID) injection 0.5 mg  0.5 mg Intravenous Q2H PRN Ina Homes, MD   0.5 mg at 10/30/2018 0750  . memantine (NAMENDA) tablet 5 mg  5 mg Oral BID Ina Homes, MD      . Derrill Memo ON 10/17/2018] midodrine (PROAMATINE) tablet 10 mg  10 mg Oral Q T,Th,Sa-HD Ina Homes, MD      . mirtazapine (REMERON) tablet 15 mg  15 mg Oral QHS Helberg, Justin, MD      . OLANZapine (ZYPREXA) tablet 2.5 mg  2.5 mg Oral QHS Helberg, Justin, MD      . ondansetron (ZOFRAN-ODT) disintegrating tablet 4 mg  4 mg Oral BID PRN Ina Homes, MD      . senna-docusate (Senokot-S) tablet 1 tablet  1 tablet Oral QHS Ina Homes, MD       Labs: Basic Metabolic Panel: Recent Labs  Lab 10/22/2018 0243  NA 139  K 3.4*  CL 104  CO2 24  GLUCOSE 125*  BUN 42*  CREATININE 5.13*  CALCIUM 7.2*   Liver Function Tests: Recent Labs  Lab 10/26/2018 0243  AST 13*  ALT 8  ALKPHOS 66  BILITOT 0.3  PROT 3.5*  ALBUMIN 1.3*   No results for input(s): LIPASE, AMYLASE in the last 168 hours. No results for input(s): AMMONIA in the last 168 hours. CBC: Recent Labs  Lab 10/17/2018 0243 10/08/2018 0858  WBC 4.3 7.9  HGB 6.9* 11.1*  HCT 23.3* 37.7  MCV 91.0 94.5  PLT 87* 96*   Cardiac Enzymes: Recent Labs  Lab 11/03/2018 0243  TROPONINI <0.03   CBG: No results for input(s): GLUCAP in the last 168 hours. Iron Studies: No results for input(s): IRON, TIBC, TRANSFERRIN, FERRITIN in the last 72 hours. Studies/Results: Ct Chest W Contrast  Result Date: 11/02/2018 CLINICAL DATA:  Question hematoma or pseudoaneurysm on the left secondary 2 AV fistula. EXAM: CT CHEST WITH CONTRAST TECHNIQUE: Multidetector CT imaging of the chest was performed during intravenous contrast administration. CONTRAST:  37mL OMNIPAQUE IOHEXOL 300 MG/ML  SOLN COMPARISON:  Thoracic spine CT 07/03/2018. FINDINGS:  Cardiovascular: Heart size is normal. Extensive coronary artery calcification is present. There is extensive aortic atherosclerotic calcification. Maximal diameter of the ascending aorta is 3.2 cm. No evidence of aortic dissection. No pulmonary emboli are seen. By history, there is a left  arm AV fistula. Enlarged venous components are evident. There is no evidence of pseudoaneurysm showing flow. There is a fluid collection in the left axilla that looks most consistent with a hematoma, having some dependent layering hyperdense material, measuring approximately 9.5 x 9.5 x 7.0 cm. Mediastinum/Nodes: No mass or lymphadenopathy. Lungs/Pleura: Bilateral dependent layering pleural effusions with dependent pulmonary atelectasis. Non dependent lung is clear. Upper Abdomen: Moderate amount of ascites. Musculoskeletal: Inferior endplate fracture at L57 and superior endplate fracture at L1 with endplate irregularity. This is chronic and does not appear grossly progressive since the CT scan of the spine done on 07/03/2018. IMPRESSION: 9.5 x 9.5 x 7.0 cm well-circumscribed collection in the left axilla with layering hyperdense material, likely to represent a postprocedural hematoma. There is no sign of any flow to suggest this represents a pseudo aneurysm. Left arm AV fistula appears patent. As above, no sign of a pseudo aneurysm. Bilateral pleural effusions layering dependently with dependent pulmonary atelectasis. Ascites. Electronically Signed   By: Nelson Chimes M.D.   On: 10/23/2018 06:09   Dg Chest Port 1 View  Result Date: 10/30/2018 CLINICAL DATA:  Chest pain. EXAM: PORTABLE CHEST 1 VIEW COMPARISON:  Chest radiograph 10/03/2018 FINDINGS: Low lung volumes limit assessment. Patient is rotated. Cardiomegaly with tortuous atherosclerotic thoracic aorta. Hazy opacity at the right lung base suggesting small effusion and atelectasis. Previous left pleural effusion has resolved. Similar vascular congestion without overt  pulmonary edema. No pneumothorax. Gaseous gastric distention in the upper abdomen is incidentally noted. IMPRESSION: 1. Low lung volumes limit assessment. Hazy opacity at the right lung base suggesting small effusion and atelectasis. 2. Cardiomegaly with tortuous atherosclerotic thoracic aorta. Unchanged vascular congestion. Electronically Signed   By: Keith Rake M.D.   On: 11/04/2018 03:23    ROS: As per HPI otherwise negative.   Physical Exam: Vitals:   10/31/2018 0715 10/15/2018 0740 10/07/2018 0825 10/24/2018 0900  BP: (!) 125/58 (!) 115/59 (!) 100/56 110/61  Pulse:   99 85  Resp: (!) 0 15 14 13   Temp:      TempSrc:      SpO2:    97%  Weight:      Height:         General: Frail elderly female in no acute distress. Head: Normocephalic, atraumatic, sclera non-icteric, mucus membranes are moist Neck: Supple. JVD not elevated. Lungs: Clear bilaterally to auscultation without wheezes, rales, or rhonchi. Breathing is unlabored. Heart: RRR with S1 S2. 2/6 systolic M. SR on monitor. Abdomen: Soft, non-tender, non-distended with normoactive bowel sounds. No rebound/guarding. No obvious abdominal masses. M-S:  Strength and tone appear normal for age. Upper/Lower extremities: 2+ BLE edema. Has large firm mass under L Axilla area, fixed, painful to touch.  Neuro: Alert and oriented X 3. Moves all extremities spontaneously. Psych:  Responds to questions appropriately with a normal affect. Dialysis Access: L AVF + bruit  Dialysis Orders: [facility] T,Th,S 3.5 hrs 180NRe 400/Autoflow 1.5 45 kg 3.0 K/2.0 Ca UFP 4 L AVF -No heparin -Mircera 100 mcg IV q 2 weeks (last dose 10/03/2018 Last HGB 10.6 10/01/18)   Assessment/Plan: 1.  R Axillary Hematoma in setting of supra therapeutic INR. Per primary 2.  FFT in setting of noncompliance with HD. Has had 1 hr 16 minutes HD since 10/03/18. Albumin 1.3. Per primary.   3.  ESRD -  T,Th,S HD today off schedule and again tomorrow to  get back on schedule. Use 4.0 K bath, no heparin. 4.  Hypertension/volume  -Has BLE edema, vascular congestion on CXR. Wt 63.5-doubt accuracy. Attempt 1.5-2 liters with HD today.  5.  Anemia  - Initial HGB 6.9 on adm now 11.1 after 1 unit of PRBCs. Recheck prior to HD.  6.  Metabolic bone disease -  Ca 7.2 C Ca 9.4. No binders on OP med list or PTA med list. Check phos here. No VDRA.  7.  Nutrition - Albumin 1.3 Add prostat, renal vits.  8.  H/O R DVT on Coumadin. Believe risks of coumadin in elderly, noncompliant ESRD pt outweigh benefit. Per primary 9.  Dementia.   Gale Hulse H. Owens Shark, NP-C 10/31/2018, 12:26 PM  [employer] [phone]

## 2018-10-16 NOTE — ED Notes (Signed)
ED TO INPATIENT HANDOFF REPORT  ED Nurse Name and Phone #: Percell Locus, RN   S Name/Age/Gender Nancy [name] 78 y.o. female Room/Bed: 030C/030C  Code Status   Code Status: DNR  Home/SNF/Other Home Patient oriented to: self, place, time and situation Is this baseline? Yes   Triage Complete: Triage complete  Chief Complaint CP  Triage Note Pt coming by EMS after pain from developing an abscess under left arm that started today, approx. Baseball sized. Having chest pain, began around time mass was noticed by family. Dialysis pt, received it Saturday. Hx of heart murmur. ASA given, no nitro given by EMS. AV fistula in left arm. Vitals WDL   Allergies Allergies  Allergen Reactions  . Hydrocodone Nausea And Vomiting and Other (See Comments)    Dizziness, also  . Tape Other (See Comments)    TAPE LEAVES DARK PATCHES ON THE SKIN!!    Level of Care/Admitting Diagnosis ED Disposition    ED Disposition Condition [facility] Area: [place] [[id number]]  Level of Care: Progressive [102]  Diagnosis: Supratherapeutic INR [[id number]]  Admitting Physician: Bosie Helper  Attending Physician: Lucious Groves [[id number]]  Estimated length of stay: past midnight tomorrow  Certification:: I certify this patient will need inpatient services for at least 2 midnights  PT Class (Do Not Modify): Inpatient [101]  PT Acc Code (Do Not Modify): Private [1]       B Medical/Surgery History Past Medical History:  Diagnosis Date  . Anemia   . Aortic stenosis   . Bacterial sinusitis 09/17/2011  . CHF (congestive heart failure) ([place])   . CKD (chronic kidney disease) stage 4, GFR 15-29 ml/min ([place]) 08/11/2006   Cr continues to increase. Proteinuria on UA 02/10/12.    . Colitis   . CVA (cerebrovascular accident) [facility])    New hemorrhagic per CT scan '09  . Diverticulosis of colon   . Dysfunctional uterine bleeding   . ESRD (end stage renal disease) on dialysis ([place])     "MWF; [facility]" (11/27/2017)  . Fecal impaction ([place])   . Headache(784.0)   . Heart murmur   . HERNIORRHAPHY, HX OF 08/11/2006  . Hypertension   . OA (osteoarthritis)    bilateral knees  . Postmenopausal   . Pulmonary nodule   . TINEA CRURIS 01/12/2007   Past Surgical History:  Procedure Laterality Date  . ABDOMINAL HYSTERECTOMY    . AV FISTULA PLACEMENT Left 02/19/2017   Procedure: CREATION OF LEFT ARM BRACHIOCEPHALIC ARTERIOVENOUS (AV) FISTULA;  Surgeon: Rosetta Posner, MD;  Location: [place];  Service: Vascular;  Laterality: Left;  . BASCILIC VEIN TRANSPOSITION Left 04/23/2017   Procedure: BASILIC VEIN TRANSPOSITION SECOND STAGE;  Surgeon: Rosetta Posner, MD;  Location: [place];  Service: Vascular;  Laterality: Left;  . BIOPSY  02/20/2018   Procedure: BIOPSY;  Surgeon: Carol Ada, MD;  Location: [facility] ENDOSCOPY;  Service: Endoscopy;;  . BIOPSY  03/07/2018   Procedure: BIOPSY;  Surgeon: Carol Ada, MD;  Location[facility] ENDOSCOPY;  Service: Endoscopy;;  . CHOLECYSTECTOMY  2009  . COLONOSCOPY    . ESOPHAGOGASTRODUODENOSCOPY N/A 03/07/2018   Procedure: ESOPHAGOGASTRODUODENOSCOPY (EGD);  Surgeon: Carol Ada, MD;  Location: [place];  Service: Endoscopy;  Laterality: N/A;  . ESOPHAGOGASTRODUODENOSCOPY (EGD) WITH PROPOFOL N/A 02/20/2018   Procedure: ESOPHAGOGASTRODUODENOSCOPY (EGD) WITH PROPOFOL;  Surgeon: Carol Ada, MD;  Location: [facility] ENDOSCOPY;  Service: Endoscopy;  Laterality: N/A;  . INGUINAL HERNIA REPAIR  2008  .  INSERTION OF DIALYSIS CATHETER Right 02/19/2017   Procedure: INSERTION OF TUNNELED DIALYSIS CATHETER - RIGHT INTERNAL JUGULAR PLACEMENT;  Surgeon: Rosetta Posner, MD;  Location: [place];  Service: Vascular;  Laterality: Right;  . IRIDOTOMY / IRIDECTOMY     Laser, right eye 12/26/11 left eye 01/24/12  . MASS EXCISION Left 05/07/2013   Procedure: EXCISION CYST;  Surgeon: Myrtha Mantis., MD;  Location: [place];  Service: Ophthalmology;  Laterality:  Left;  . SAVORY DILATION N/A 02/20/2018   Procedure: SAVORY DILATION;  Surgeon: Carol Ada, MD;  Location: [facility] ENDOSCOPY;  Service: Endoscopy;  Laterality: N/A;     A IV Location/Drains/Wounds Patient Lines/Drains/Airways Status   Active Line/Drains/Airways    Name:   Placement date:   Placement time:   Site:   Days:   Peripheral IV 10/09/2018 Right;Anterior Antecubital   10/22/2018    0400    Antecubital   less than 1   CVC Triple Lumen 10/11/2018 Right Femoral   10/19/2018    0520     less than 1   Fistula / Graft Left Upper arm Arteriovenous fistula   02/19/17    0956    Upper arm   604   Incision (Closed) 07/04/18 Back Lower   07/04/18    1524     104          Intake/Output Last 24 hours No intake or output data in the 24 hours ending 10/21/2018 0755  Labs/Imaging Results for orders placed or performed during the hospital encounter of 11/01/2018 (from the past 48 hour(s))  CBC     Status: Abnormal   Collection Time: 10/22/2018  2:43 AM  Result Value Ref Range   WBC 4.3 4.0 - 10.5 K/uL   RBC 2.56 (L) 3.87 - 5.11 MIL/uL   Hemoglobin 6.9 (LL) 12.0 - 15.0 g/dL    Comment: This critical result has verified and been called to S GRINDSTAFF,RN by Gerrie Nordmann on 04 10 2020 at 0256, and has been read back. READ BACK AND VERIFIED REPEATED TO VERIFY CORRECTED ON 04/10 AT 0301: PREVIOUSLY REPORTED AS 6.9 This critical result has verified and been called to S GRINDSTAFF,RN by Gerrie Nordmann on 04 10 2020 at 0256, and has been read back. READ BACK AND VERIFIED    HCT 23.3 (L) 36.0 - 46.0 %   MCV 91.0 80.0 - 100.0 fL   MCH 27.0 26.0 - 34.0 pg   MCHC 29.6 (L) 30.0 - 36.0 g/dL   RDW 20.0 (H) 11.5 - 15.5 %   Platelets 87 (L) 150 - 400 K/uL    Comment: REPEATED TO VERIFY PLATELET COUNT CONFIRMED BY SMEAR SPECIMEN CHECKED FOR CLOTS Immature Platelet Fraction may be clinically indicated, consider ordering this additional test [id number]    nRBC 0.0 0.0 - 0.2 %    Comment: Performed at [facility], [place] [address], [place], [place] [address]  Comprehensive metabolic panel     Status: Abnormal   Collection Time: 10/23/2018  2:43 AM  Result Value Ref Range   Sodium 139 135 - 145 mmol/L   Potassium 3.4 (L) 3.5 - 5.1 mmol/L   Chloride 104 98 - 111 mmol/L   CO2 24 22 - 32 mmol/L   Glucose, Bld 125 (H) 70 - 99 mg/dL   BUN 42 (H) 8 - 23 mg/dL   Creatinine, Ser 5.13 (H) 0.44 - 1.00 mg/dL   Calcium 7.2 (L) 8.9 - 10.3 mg/dL   Total  Protein 3.5 (L) 6.5 - 8.1 g/dL   Albumin 1.3 (L) 3.5 - 5.0 g/dL   AST 13 (L) 15 - 41 U/L   ALT 8 0 - 44 U/L   Alkaline Phosphatase 66 38 - 126 U/L   Total Bilirubin 0.3 0.3 - 1.2 mg/dL   GFR calc non Af Amer 8 (L) >60 mL/min   GFR calc Af Amer 9 (L) >60 mL/min   Anion gap 11 5 - 15    Comment: Performed at [facility] [address], [place], [place] [address]  Troponin I - ONCE - STAT     Status: None   Collection Time: 10/19/2018  2:43 AM  Result Value Ref Range   Troponin I <0.03 <0.03 ng/mL    Comment: Performed at [facility] [address] [place], [place] [address]  Protime-INR     Status: Abnormal   Collection Time: 10/30/2018  2:43 AM  Result Value Ref Range   Prothrombin Time 48.1 (H) 11.4 - 15.2 seconds   INR 5.4 (HH) 0.8 - 1.2    Comment: REPEATED TO VERIFY CRITICAL RESULT CALLED TO, READ BACK BY AND VERIFIED WITH: S.GRINDSTAFF,RN 0319 10/19/2018 M.CAMPBELL (NOTE) INR goal varies based on device and disease states. Performed at [facility], [place] [address], [place], [place] [address]   Type and screen [place]     Status: None (Preliminary result)   Collection Time: 11/01/2018  4:00 AM  Result Value Ref Range   ABO/RH(D) A POS    Antibody Screen NEG    Sample Expiration 10/19/2018    Unit Number [id number]    Blood Component Type RED CELLS,LR    Unit division 00    Status of Unit ISSUED    Transfusion Status OK TO TRANSFUSE    Crossmatch Result      Compatible Performed at [facility], [place] [address] [place], [place] [address]   Prepare RBC     Status: None   Collection Time: 10/25/2018  4:11 AM  Result Value Ref Range   Order Confirmation      ORDER PROCESSED BY BLOOD BANK Performed at [facility], [place] [address], [place], [place] [address]    Ct Chest W Contrast  Result Date: 10/30/2018 CLINICAL DATA:  Question hematoma or pseudoaneurysm on the left secondary 2 AV fistula. EXAM: CT CHEST WITH CONTRAST TECHNIQUE: Multidetector CT imaging of the chest was performed during intravenous contrast administration. CONTRAST:  43mL OMNIPAQUE IOHEXOL 300 MG/ML  SOLN COMPARISON:  Thoracic spine CT 07/03/2018. FINDINGS: Cardiovascular: Heart size is normal. Extensive coronary artery calcification is present. There is extensive aortic atherosclerotic calcification. Maximal diameter of the ascending aorta is 3.2 cm. No evidence of aortic dissection. No pulmonary emboli are seen. By history, there is a left arm AV fistula. Enlarged venous components are evident. There is no evidence of pseudoaneurysm showing flow. There is a fluid collection in the left axilla that looks most consistent with a hematoma, having some dependent layering hyperdense material, measuring approximately 9.5 x 9.5 x 7.0 cm. Mediastinum/Nodes: No mass or lymphadenopathy. Lungs/Pleura: Bilateral dependent layering pleural effusions with dependent pulmonary atelectasis. Non dependent lung is clear. Upper Abdomen: Moderate amount of ascites. Musculoskeletal: Inferior endplate fracture at A63 and superior endplate fracture at L1 with endplate irregularity. This is chronic and does not appear grossly progressive since the CT scan of the spine done on 07/03/2018. IMPRESSION: 9.5 x 9.5 x 7.0  cm well-circumscribed collection in the left axilla with layering hyperdense material, likely to represent a postprocedural hematoma. There is no sign of any flow to suggest this represents a pseudo aneurysm. Left arm AV fistula  appears patent. As above, no sign of a pseudo aneurysm. Bilateral pleural effusions layering dependently with dependent pulmonary atelectasis. Ascites. Electronically Signed   By: Nelson Chimes M.D.   On: 10/10/2018 06:09   Dg Chest Port 1 View  Result Date: 10/19/2018 CLINICAL DATA:  Chest pain. EXAM: PORTABLE CHEST 1 VIEW COMPARISON:  Chest radiograph 10/03/2018 FINDINGS: Low lung volumes limit assessment. Patient is rotated. Cardiomegaly with tortuous atherosclerotic thoracic aorta. Hazy opacity at the right lung base suggesting small effusion and atelectasis. Previous left pleural effusion has resolved. Similar vascular congestion without overt pulmonary edema. No pneumothorax. Gaseous gastric distention in the upper abdomen is incidentally noted. IMPRESSION: 1. Low lung volumes limit assessment. Hazy opacity at the right lung base suggesting small effusion and atelectasis. 2. Cardiomegaly with tortuous atherosclerotic thoracic aorta. Unchanged vascular congestion. Electronically Signed   By: Keith Rake M.D.   On: 10/21/2018 03:23    Pending Labs Unresulted Labs (From admission, onward)    Start     Ordered   10/18/18 0500  Protime-INR  Daily,   R     10/19/2018 0720   10/17/18 0500  CBC  Tomorrow morning,   R     11/05/2018 0725   10/17/18 0500  Renal function panel  Tomorrow morning,   R     10/26/2018 0725   10/22/2018 0730  Protime-INR  Now then every 6 hours,   R     10/10/2018 0720          Vitals/Pain Today's Vitals   10/31/2018 0700 10/21/2018 0715 10/19/2018 0735 10/22/2018 0740  BP: (!) 110/52 (!) 125/58  (!) 115/59  Pulse:      Resp: 15 (!) 0  15  Temp:      TempSrc:      SpO2:      Weight:      Height:      PainSc:   10-Worst pain ever     Isolation Precautions No active isolations  Medications Medications  0.9 %  sodium chloride infusion (Manually program via Guardrails IV Fluids) (has no administration in time range)  acetaminophen (TYLENOL) tablet 650 mg (has no  administration in time range)  midodrine (PROAMATINE) tablet 10 mg (has no administration in time range)  memantine (NAMENDA) tablet 5 mg (has no administration in time range)  mirtazapine (REMERON) tablet 15 mg (has no administration in time range)  OLANZapine (ZYPREXA) tablet 2.5 mg (has no administration in time range)  calcitRIOL (ROCALTROL) capsule 2 mcg (has no administration in time range)  ondansetron (ZOFRAN-ODT) disintegrating tablet 4 mg (has no administration in time range)  senna-docusate (Senokot-S) tablet 1 tablet (has no administration in time range)  feeding supplement (NEPRO CARB STEADY) liquid 237 mL (has no administration in time range)  HYDROmorphone (DILAUDID) injection 0.5 mg (0.5 mg Intravenous Given 10/22/2018 0750)  phytonadione (VITAMIN K) 10 mg in dextrose 5 % 50 mL IVPB (10 mg Intravenous New Bag/Given 10/14/2018 0636)  fentaNYL (SUBLIMAZE) injection 50 mcg (50 mcg Intravenous Given 11/03/2018 0620)  sodium chloride 0.9 % bolus 500 mL (500 mLs Intravenous New Bag/Given 10/31/2018 0552)  iohexol (OMNIPAQUE) 300 MG/ML solution 75 mL (75 mLs Intravenous Contrast Given 10/19/2018 0437)  prothrombin complex conc human (KCENTRA) IVPB 2,303 Units (2,303 Units Intravenous New Bag/Given 10/31/2018  0644)    Mobility walks with device Moderate fall risk   Focused Assessments Renal Assessment Handoff:  Hemodialysis Schedule: Hemodialysis Schedule: Tuesday/Thursday/Saturday Last Hemodialysis date and time: "last week" per pt   Restricted appendage: left arm     R Recommendations: See Admitting Provider Note  Report given to:   Additional Notes:

## 2018-10-16 NOTE — Progress Notes (Signed)
Getting report from [facility] of dialysis, who reported that they run the dialysis but did not take off any fluid because of low BP. Reported that albumin and midodrine was given to patient to help with the blood pressure.

## 2018-10-16 NOTE — ED Triage Notes (Signed)
Pt coming by EMS after pain from developing an abscess under left arm that started today, approx. Baseball sized. Having chest pain, began around time mass was noticed by family. Dialysis pt, received it Saturday. Hx of heart murmur. ASA given, no nitro given by EMS. AV fistula in left arm. Vitals WDL

## 2018-10-16 NOTE — Progress Notes (Addendum)
Patient arrived to 4E room 13 at this time. V/s done and telemetry applied. Assessment complete. Patient oriented to room and how to call nurse with any needs.  Emelda Fear, RN

## 2018-10-16 NOTE — Addendum Note (Signed)
Addended by: Forde Dandy on: 11/02/2018 07:54 PM   Modules accepted: Orders

## 2018-10-16 NOTE — Progress Notes (Signed)
Patient's BP 82/45 HR 99. Patient asymptomatic and asleep in bed. Recheck 72/50. Now 84/49 HR 99. Dr. Alfonse Spruce notified. No new orders. Will continue to monitor.   Emelda Fear, RN

## 2018-10-16 NOTE — ED Notes (Signed)
Pt transported to CT ?

## 2018-10-16 NOTE — ED Notes (Signed)
Upon assessment with MD it was noted that the abscess on the pt's arm had increased in size. Pt hypotensive. MD given verbal orders and pt taken to CT scan

## 2018-10-16 NOTE — ED Notes (Signed)
IV team at beside attempting to place line.

## 2018-10-16 NOTE — ED Provider Notes (Addendum)
[facility] EMERGENCY DEPARTMENT Provider Note  CSN: [id number] Arrival date & time: 10/28/2018 0201  Chief Complaint(s) Abscess and Chest Pain  HPI Nancy [name] is a 78 y.o. female with a past medical history listed below including ESRD on dialysis Tuesday Thursday and Saturday, a prior history of right upper extremity DVT on Coumadin who presents to the emergency department with left-sided/axillary chest pain noted earlier this evening.  Area of swelling was noticed by family and that is when the patient noted the pain.  Pain is exacerbated with left upper extremity movement and palpation of the left upper chest and axillary area.  Alleviated by immobility.  She denies any trauma.  No shortness of breath.  No associated nausea.  No recent fevers or infections.  Patient reports that last dialysis was Tuesday.   Of note patient was seen in the ED on March 28 after dialysis for bleeding fistula that required suturing.  HPI  Past Medical History Past Medical History:  Diagnosis Date   Anemia    Aortic stenosis    Bacterial sinusitis 09/17/2011   CHF (congestive heart failure) (HCC)    CKD (chronic kidney disease) stage 4, GFR 15-29 ml/min ([place]) 08/11/2006   Cr continues to increase. Proteinuria on UA 02/10/12.     Colitis    CVA (cerebrovascular accident) [facility])    New hemorrhagic per CT scan '09   Diverticulosis of colon    Dysfunctional uterine bleeding    ESRD (end stage renal disease) on dialysis ([place])    "MWF; [facility]" (11/27/2017)   Fecal impaction ([place])    Headache(784.0)    Heart murmur    HERNIORRHAPHY, HX OF 08/11/2006   Hypertension    OA (osteoarthritis)    bilateral knees   Postmenopausal    Pulmonary nodule    TINEA CRURIS 01/12/2007   Patient Active Problem List   Diagnosis Date Noted   Failure to thrive in adult 10/07/2018   DNR (do not resuscitate)    Acute deep vein thrombosis (DVT) of axillary vein of right upper  extremity ([place])    Pneumonia 10/04/2018   Sepsis ([place]) 10/04/2018   Hypoglycemia 10/03/2018   Pancytopenia ([place])    Discitis of thoracolumbar region    Psychomotor agitation    Epidural abscess 07/04/2018   Acute osteomyelitis of thoracic spine ([place]) 07/03/2018   DNR (do not resuscitate) discussion    Right renal mass    Chronic cough 04/17/2018   Acute gastritis without hemorrhage    Duodenitis with bleeding    ESRD (end stage renal disease) ([place])    Papular lichenification 76/28/3151   Acute on chronic blood loss anemia 03/06/2018   Palliative care by specialist    Protein-calorie malnutrition, severe 01/12/2018   Midline thoracic back pain 12/23/2017   Symptomatic anemia 02/17/2017   Secondary hyperparathyroidism of renal origin ([place]) 01/15/2017   Mitral valve annular calcification    Dizziness 12/08/2016   Angina at rest [facility])    Goals of care, counseling/discussion    Palliative care encounter    Aortic stenosis 05/21/2016   Anemia associated with chronic renal failure 02/01/2016   Atherosclerosis of aorta ([place]) 01/11/2015   End stage renal disease ([place]) 02/04/2013   Non-intractable vomiting with nausea 08/17/2012   Glaucoma 03/18/2012   Health care maintenance 09/17/2011   Osteoarthrosis involving lower leg 10/31/2008   History of CVA (cerebrovascular accident) 01/28/2008   Hyperlipidemia 02/13/2007   PULMONARY NODULES 01/12/2007   Left ventricular hypertrophy 09/02/2006  Essential hypertension 08/11/2006   GERD 08/11/2006   Home Medication(s) Prior to Admission medications   Medication Sig Start Date End Date Taking? Authorizing Provider  acetaminophen (TYLENOL) 325 MG tablet Take 2 tablets (650 mg total) by mouth 2 (two) times daily as needed for mild pain, moderate pain, fever or headache. 10/07/18   Carroll Sage, MD  aspirin 81 MG EC tablet Take 1 tablet (81 mg total) by mouth daily. 05/14/18   Masoudi, Dorthula Rue, MD    calcitRIOL (ROCALTROL) 0.5 MCG capsule Take 4 capsules (2 mcg total) by mouth every Monday, Wednesday, and Friday with hemodialysis. Patient taking differently: Take 2 mcg by mouth See admin instructions. Take 4 tablet (76mcg totally) by mouth on Harrell Lark, and Sat 12/30/17   Neva Seat, MD  dextrose (GLUTOSE) 40 % GEL Take 37.5 g by mouth once as needed for low blood sugar. 10/07/18   Carroll Sage, MD  diphenhydrAMINE-zinc acetate (BENADRYL) cream Apply topically 3 (three) times daily as needed for itching. Patient not taking: Reported on 06/23/2018 04/20/18   Masoudi, Dorthula Rue, MD  enoxaparin (LOVENOX) 60 MG/0.6ML injection Inject 0.5 mLs (50 mg total) into the skin daily for 3 days. 10/07/18 10/10/18  Carroll Sage, MD  latanoprost (XALATAN) 0.005 % ophthalmic solution Place 1 drop into both eyes at bedtime. 04/11/18   [provider]  memantine (NAMENDA) 5 MG tablet Take 5 mg by mouth 2 (two) times daily.    [provider]  midodrine (PROAMATINE) 10 MG tablet Take 1 tablet (10 mg total) by mouth daily. Take on dialysis days. Patient taking differently: Take 10 mg by mouth See admin instructions. Take 1 tablet (10 mg totally) by mouth on Harrell Lark, and Sat 07/29/18   Dorrell, [name], MD  mirtazapine (REMERON) 15 MG tablet Take 15 mg by mouth at bedtime. 09/15/18   [provider]  multivitamin (RENA-VIT) TABS tablet Take 1 tablet by mouth at bedtime. Patient not taking: Reported on 10/03/2018 07/29/18   Dorrell, [name], MD  Nutritional Supplements (FEEDING SUPPLEMENT, NEPRO CARB STEADY,) LIQD Take 237 mLs by mouth 2 (two) times daily between meals for 30 days. 10/08/18 11/07/18  Carroll Sage, MD  OLANZapine (ZYPREXA) 2.5 MG tablet Take 1 tablet (2.5 mg total) by mouth at bedtime. 07/29/18   Dorrell, [name], MD  ondansetron (ZOFRAN-ODT) 4 MG disintegrating tablet Take 1 tablet (4 mg total) by mouth 2 (two) times daily as needed for nausea or vomiting. 10/07/18    Carroll Sage, MD  OVER THE COUNTER MEDICATION Apply 1 application topically See admin instructions. Biofreeze 5% gel topically, apply to back, area of pain three times daily    [provider]  oxyCODONE-acetaminophen (PERCOCET) 5-325 MG tablet Take 0.5-1 tablets by mouth every 4 (four) hours as needed. Patient taking differently: Take 1 tablet by mouth 2 (two) times daily as needed for moderate pain.  05/14/18   Masoudi, Elhamalsadat, MD  senna-docusate (SENOKOT-S) 8.6-50 MG tablet Take 1 tablet by mouth at bedtime as needed for mild constipation. Patient taking differently: Take 1 tablet by mouth at bedtime.  05/14/18   Masoudi, Dorthula Rue, MD  warfarin (COUMADIN) 2.5 MG tablet TAKE 1 TABLET BY MOUTH EVERY DAY AT 6 PM 10/09/18   Aldine Contes, MD  Past Surgical History Past Surgical History:  Procedure Laterality Date   ABDOMINAL HYSTERECTOMY     AV FISTULA PLACEMENT Left 02/19/2017   Procedure: CREATION OF LEFT ARM BRACHIOCEPHALIC ARTERIOVENOUS (AV) FISTULA;  Surgeon: Rosetta Posner, MD;  Location: [place];  Service: Vascular;  Laterality: Left;   [place] Left 04/23/2017   Procedure: BASILIC VEIN TRANSPOSITION SECOND STAGE;  Surgeon: Rosetta Posner, MD;  Location: [place];  Service: Vascular;  Laterality: Left;   BIOPSY  02/20/2018   Procedure: BIOPSY;  Surgeon: Carol Ada, MD;  Location: [facility] ENDOSCOPY;  Service: Endoscopy;;   BIOPSY  03/07/2018   Procedure: BIOPSY;  Surgeon: Carol Ada, MD;  Location: [facility] ENDOSCOPY;  Service: Endoscopy;;   CHOLECYSTECTOMY  2009   COLONOSCOPY     ESOPHAGOGASTRODUODENOSCOPY N/A 03/07/2018   Procedure: ESOPHAGOGASTRODUODENOSCOPY (EGD);  Surgeon: Carol Ada, MD;  Location: [place];  Service: Endoscopy;  Laterality: N/A;   ESOPHAGOGASTRODUODENOSCOPY (EGD) WITH PROPOFOL N/A 02/20/2018    Procedure: ESOPHAGOGASTRODUODENOSCOPY (EGD) WITH PROPOFOL;  Surgeon: Carol Ada, MD;  Location: [facility] ENDOSCOPY;  Service: Endoscopy;  Laterality: N/A;   INGUINAL HERNIA REPAIR  2008   INSERTION OF DIALYSIS CATHETER Right 02/19/2017   Procedure: INSERTION OF TUNNELED DIALYSIS CATHETER - RIGHT INTERNAL JUGULAR PLACEMENT;  Surgeon: Rosetta Posner, MD;  Location: [place];  Service: Vascular;  Laterality: Right;   IRIDOTOMY / IRIDECTOMY     Laser, right eye 12/26/11 left eye 01/24/12   MASS EXCISION Left 05/07/2013   Procedure: EXCISION CYST;  Surgeon: Myrtha Mantis., MD;  Location: [place];  Service: Ophthalmology;  Laterality: Left;   SAVORY DILATION N/A 02/20/2018   Procedure: SAVORY DILATION;  Surgeon: Carol Ada, MD;  Location: [facility] ENDOSCOPY;  Service: Endoscopy;  Laterality: N/A;   Family History Family History  Problem Relation Age of Onset   Hypertension Mother    Congestive Heart Failure Mother    Heart attack Brother 64    Social History Social History   Tobacco Use   Smoking status: Never Smoker   Smokeless tobacco: Never Used  Substance Use Topics   Alcohol use: Not on file   Drug use: No    Comment: 08/15/08 UDS + cocaine   Allergies Hydrocodone and Tape  Review of Systems Review of Systems All other systems are reviewed and are negative for acute change except as noted in the HPI  Physical Exam Vital Signs  I have reviewed the triage vital signs BP (!) 105/56    Pulse (!) 103    Temp 97.8 F (36.6 C) (Oral)    Resp 18    Ht 5\' 3"  (1.6 m)    Wt 63.5 kg    SpO2 99%    BMI 24.80 kg/m   Physical Exam Vitals signs reviewed.  Constitutional:      General: She is not in acute distress.    Appearance: She is well-developed. She is not diaphoretic.  HENT:     Head: Normocephalic and atraumatic.     Nose: Nose normal.  Eyes:     General: No scleral icterus.       Right eye: No discharge.        Left eye: No discharge.      Conjunctiva/sclera: Conjunctivae normal.     Pupils: Pupils are equal, round, and reactive to light.  Neck:     Musculoskeletal: Normal range of motion and neck supple.  Cardiovascular:     Rate and Rhythm: Normal rate and  regular rhythm.     Heart sounds: No murmur. No friction rub. No gallop.   Pulmonary:     Effort: Pulmonary effort is normal. No respiratory distress.     Breath sounds: Normal breath sounds. No stridor. No rales.  Chest:     Chest wall: Tenderness present.    Abdominal:     General: There is no distension.     Palpations: Abdomen is soft.     Tenderness: There is no abdominal tenderness.  Musculoskeletal:        General: No tenderness.       Arms:  Skin:    General: Skin is warm and dry.     Findings: No erythema or rash.  Neurological:     Mental Status: She is alert and oriented to person, place, and time.     ED Results and Treatments Labs (all labs ordered are listed, but only abnormal results are displayed) Labs Reviewed  CBC - Abnormal; Notable for the following components:      Result Value   RBC 2.56 (*)    Hemoglobin 6.9 (*)    HCT 23.3 (*)    MCHC 29.6 (*)    RDW 20.0 (*)    Platelets 87 (*)    All other components within normal limits  COMPREHENSIVE METABOLIC PANEL - Abnormal; Notable for the following components:   Potassium 3.4 (*)    Glucose, Bld 125 (*)    BUN 42 (*)    Creatinine, Ser 5.13 (*)    Calcium 7.2 (*)    Total Protein 3.5 (*)    Albumin 1.3 (*)    AST 13 (*)    GFR calc non Af Amer 8 (*)    GFR calc Af Amer 9 (*)    All other components within normal limits  PROTIME-INR - Abnormal; Notable for the following components:   Prothrombin Time 48.1 (*)    INR 5.4 (*)    All other components within normal limits  TROPONIN I  TYPE AND SCREEN  PREPARE RBC (CROSSMATCH)                                                                                                                         EKG  EKG  Interpretation  Date/Time: 10/17/2018   02:16:10   Ventricular Rate:   106 PR Interval:    QRS Duration:  103 QT Interval:   334 QTC Calculation:  444 R Axis:     Text Interpretation: Normal sinus rhythm with stable T wave inversions in the lateral leads.  Similar to prior EKG tracings.      Radiology Ct Chest W Contrast  Result Date: 10/12/2018 CLINICAL DATA:  Question hematoma or pseudoaneurysm on the left secondary 2 AV fistula. EXAM: CT CHEST WITH CONTRAST TECHNIQUE: Multidetector CT imaging of the chest was performed during intravenous contrast administration. CONTRAST:  24mL OMNIPAQUE IOHEXOL 300 MG/ML  SOLN COMPARISON:  Thoracic spine CT 07/03/2018. FINDINGS: Cardiovascular: Heart size  is normal. Extensive coronary artery calcification is present. There is extensive aortic atherosclerotic calcification. Maximal diameter of the ascending aorta is 3.2 cm. No evidence of aortic dissection. No pulmonary emboli are seen. By history, there is a left arm AV fistula. Enlarged venous components are evident. There is no evidence of pseudoaneurysm showing flow. There is a fluid collection in the left axilla that looks most consistent with a hematoma, having some dependent layering hyperdense material, measuring approximately 9.5 x 9.5 x 7.0 cm. Mediastinum/Nodes: No mass or lymphadenopathy. Lungs/Pleura: Bilateral dependent layering pleural effusions with dependent pulmonary atelectasis. Non dependent lung is clear. Upper Abdomen: Moderate amount of ascites. Musculoskeletal: Inferior endplate fracture at F02 and superior endplate fracture at L1 with endplate irregularity. This is chronic and does not appear grossly progressive since the CT scan of the spine done on 07/03/2018. IMPRESSION: 9.5 x 9.5 x 7.0 cm well-circumscribed collection in the left axilla with layering hyperdense material, likely to represent a postprocedural hematoma. There is no sign of any flow to suggest this represents a pseudo  aneurysm. Left arm AV fistula appears patent. As above, no sign of a pseudo aneurysm. Bilateral pleural effusions layering dependently with dependent pulmonary atelectasis. Ascites. Electronically Signed   By: Nelson Chimes M.D.   On: 10/15/2018 06:09   Dg Chest Port 1 View  Result Date: 10/15/2018 CLINICAL DATA:  Chest pain. EXAM: PORTABLE CHEST 1 VIEW COMPARISON:  Chest radiograph 10/03/2018 FINDINGS: Low lung volumes limit assessment. Patient is rotated. Cardiomegaly with tortuous atherosclerotic thoracic aorta. Hazy opacity at the right lung base suggesting small effusion and atelectasis. Previous left pleural effusion has resolved. Similar vascular congestion without overt pulmonary edema. No pneumothorax. Gaseous gastric distention in the upper abdomen is incidentally noted. IMPRESSION: 1. Low lung volumes limit assessment. Hazy opacity at the right lung base suggesting small effusion and atelectasis. 2. Cardiomegaly with tortuous atherosclerotic thoracic aorta. Unchanged vascular congestion. Electronically Signed   By: Keith Rake M.D.   On: 10/15/2018 03:23   Pertinent labs & imaging results that were available during my care of the patient were reviewed by me and considered in my medical decision making (see chart for details).  Medications Ordered in ED Medications  phytonadione (VITAMIN K) 10 mg in dextrose 5 % 50 mL IVPB (has no administration in time range)  0.9 %  sodium chloride infusion (Manually program via Guardrails IV Fluids) (has no administration in time range)  prothrombin complex conc human (KCENTRA) IVPB 2,303 Units (has no administration in time range)  fentaNYL (SUBLIMAZE) injection 50 mcg (50 mcg Intravenous Given 10/15/2018 0620)  sodium chloride 0.9 % bolus 500 mL (500 mLs Intravenous New Bag/Given 10/07/2018 0552)  iohexol (OMNIPAQUE) 300 MG/ML solution 75 mL (75 mLs Intravenous Contrast Given 10/18/2018 0437)  Procedures Ultrasound ED Peripheral IV (Provider) Date/Time: 11/01/2018 6:23 AM Performed by: Fatima Blank, MD Authorized by: Fatima Blank, MD   Procedure details:    Indications: hypotension, multiple failed IV attempts and poor IV access     Skin Prep: chlorhexidine gluconate     Location:  Right AC   Angiocath:  20 G   Bedside Ultrasound Guided: Yes     Images: not archived     Patient tolerated procedure without complications: No     Dressing applied: Yes   Comments:     IV infiltrated after patient's arm moved to reposition in CT .Central Line Date/Time: 10/23/2018 6:24 AM Performed by: Fatima Blank, MD Authorized by: Fatima Blank, MD   Consent:    Consent obtained:  Emergent situation and verbal   Consent given by:  Patient   Risks discussed:  Arterial puncture, bleeding and incorrect placement   Alternatives discussed:  No treatment Pre-procedure details:    Hand hygiene: Hand hygiene performed prior to insertion     Sterile barrier technique: All elements of maximal sterile technique followed     Skin preparation:  ChloraPrep   Skin preparation agent: Skin preparation agent completely dried prior to procedure   Anesthesia (see MAR for exact dosages):    Anesthesia method:  Local infiltration   Local anesthetic:  Lidocaine 1% w/o epi Procedure details:    Location:  R femoral   Site selection rationale:  Supratherapeutic INR   Patient position:  Flat   Procedural supplies:  Triple lumen   Catheter size:  7.5 Fr   Ultrasound guidance: yes     Sterile ultrasound techniques: Sterile gel and sterile probe covers were used     Number of attempts:  1   Successful placement: yes   Post-procedure details:    Post-procedure:  Dressing applied and line sutured   Assessment:  Blood return through all ports and free fluid flow   Patient tolerance of procedure:  Tolerated  well, no immediate complications .Critical Care Performed by: Fatima Blank, MD Authorized by: Fatima Blank, MD     CRITICAL CARE Performed by: Grayce Sessions Brittley Regner Total critical care time: 55 minutes Critical care time was exclusive of separately billable procedures and treating other patients. Critical care was necessary to treat or prevent imminent or life-threatening deterioration. Critical care was time spent personally by me on the following activities: development of treatment plan with patient and/or surrogate as well as nursing, discussions with consultants, evaluation of patient's response to treatment, examination of patient, obtaining history from patient or surrogate, ordering and performing treatments and interventions, ordering and review of laboratory studies, ordering and review of radiographic studies, pulse oximetry and re-evaluation of patient's condition.   (including critical care time)  Medical Decision Making / ED Course I have reviewed the nursing notes for this encounter and the patient's prior records (if available in EHR or on provided paperwork).    Patient presents with left-sided chest wall and axillary pain inconsistent with cardiac etiology.  Most suspicious for hematoma versus pseudoaneurysm.  EKG without acute ischemic changes or pericarditis.  Troponin negative.  Doubt cardiac etiology.  INR supratherapeutic.  Doubt pulmonary embolism.  Presentation not classic for aortic dissection or esophageal perforation.  Chest x-ray without evidence suggestive of pneumonia, pneumothorax, pneumomediastinum.  No abnormal contour of the mediastinum to suggest dissection. No evidence of acute injuries.  Patient noted to be anemic with 3 g drop in 8 days.  She is supratherapeutic with an INR.  Metabolic panel reassuring and without evidence requiring emergent dialysis.  Vitamin K was ordered.    Patient slowly became hypotensive with systolics in  the 16X.  Mass in the left axilla continues to expand.  Plan to get CT scan to assess for pseudoaneurysm versus hematoma.  2 units of PRBCs ordered.  We had difficulty obtaining an IV in the patient.  Initial IV infiltrated.  I personally put in an ultrasound-guided IV which infiltrated.  A right femoral central line was placed.  Patient was provided with 500 cc IV fluid bolus to which her blood pressures responded well to.   CT scan showing no evidence of pseudoaneurysm or aneurysm.  Hematoma almost 10 cm without active extravasation.  On physical exam though the hematoma continues to expand. Eppie Gibson was also ordered.  Patient admitted to internal medicine for continued management.   Final Clinical Impression(s) / ED Diagnoses Final diagnoses:  Chest pain  Axillary hematoma, left, initial encounter  Blood loss anemia  Supratherapeutic INR      This chart was dictated using voice recognition software.  Despite best efforts to proofread,  errors can occur which can change the documentation meaning.     Fatima Blank, MD 10/27/2018 [phone]

## 2018-10-16 NOTE — Progress Notes (Signed)
Report given to Almyra Brace, RN in [place]

## 2018-10-16 NOTE — H&P (Addendum)
Date: 10/19/2018               Patient Name:  Nancy [name] MRN: [id number]  DOB: September 14, 1940 Age / Sex: 78 y.o., female   PCP: Aldine Contes, MD         Medical Service: Internal Medicine Teaching Service         Attending Physician: Dr. Lucious Groves, DO    First Contact: Dr. Alfonse Spruce Pager: [phone]  Second Contact: Dr. Maricela Bo Pager: [phone]       After Hours (After 5p/  First Contact Pager: [phone]  weekends / holidays): Second Contact Pager: [phone]   Chief Complaint: Left armpit pain   History of Present Illness: Nancy [name] is a 78 year old female with ESRD on HD TRS, prior CVA, hypertension, HFpEF, and recently diagnosed right upper extremity DVT on warfarin who presented to the emergency department with left axillary pain. History was obtained from the patient and her daughter, Nancy [name].  Around 11 PM on 10/15/18 patient started to experience left axillary pain. Initially it was accompanied by a knot about the size of a marble that the family thought might be an infection. They tried to treat symptomatically; however, over the preceding hours it enlarged to the size of a baseball and the pain became unbearable. She was subsequently brought to the emergency department for further evaluation. Her last INR was checks on 4/8 and found to be 6.1. Her warfarin was adjusted accordingly. She denies any trauma that side. She states that she has been taking all of her medications as prescribed. Her last full session of hemodialysis was on 4/2 but she did get a partial run on 4/7.  She denies fevers, chills, nausea/vomiting, cough, shortness of breath, chest pain, lower extremity pain, hematemesis, hematochezia. She frequently asks for warm compression and pain medicine.  Meds:  No current facility-administered medications on file prior to encounter.    Current Outpatient Medications on File Prior to Encounter  Medication Sig Dispense Refill  . acetaminophen (TYLENOL) 325  MG tablet Take 2 tablets (650 mg total) by mouth 2 (two) times daily as needed for mild pain, moderate pain, fever or headache.    Marland Kitchen aspirin 81 MG EC tablet Take 1 tablet (81 mg total) by mouth daily. 30 tablet 0  . calcitRIOL (ROCALTROL) 0.5 MCG capsule Take 4 capsules (2 mcg total) by mouth every Monday, Wednesday, and Friday with hemodialysis. (Patient taking differently: Take 2 mcg by mouth See admin instructions. Take 4 tablet (67mcg totally) by mouth on Tues, Thur, and Sat)    . dextrose (GLUTOSE) 40 % GEL Take 37.5 g by mouth once as needed for low blood sugar. 5 Tube 0  . diphenhydrAMINE-zinc acetate (BENADRYL) cream Apply topically 3 (three) times daily as needed for itching. (Patient not taking: Reported on 06/23/2018) 28.4 g 0  . enoxaparin (LOVENOX) 60 MG/0.6ML injection Inject 0.5 mLs (50 mg total) into the skin daily for 3 days. 1.5 mL 0  . latanoprost (XALATAN) 0.005 % ophthalmic solution Place 1 drop into both eyes at bedtime.  6  . memantine (NAMENDA) 5 MG tablet Take 5 mg by mouth 2 (two) times daily.    . midodrine (PROAMATINE) 10 MG tablet Take 1 tablet (10 mg total) by mouth daily. Take on dialysis days. (Patient taking differently: Take 10 mg by mouth See admin instructions. Take 1 tablet (10 mg totally) by mouth on Tues, Thur, and Sat) 30 tablet 0  . mirtazapine (REMERON)  15 MG tablet Take 15 mg by mouth at bedtime.    . multivitamin (RENA-VIT) TABS tablet Take 1 tablet by mouth at bedtime. (Patient not taking: Reported on 10/03/2018) 30 tablet 0  . Nutritional Supplements (FEEDING SUPPLEMENT, NEPRO CARB STEADY,) LIQD Take 237 mLs by mouth 2 (two) times daily between meals for 30 days. 14220 mL 0  . OLANZapine (ZYPREXA) 2.5 MG tablet Take 1 tablet (2.5 mg total) by mouth at bedtime. 30 tablet 0  . ondansetron (ZOFRAN-ODT) 4 MG disintegrating tablet Take 1 tablet (4 mg total) by mouth 2 (two) times daily as needed for nausea or vomiting. 20 tablet 0  . OVER THE COUNTER MEDICATION  Apply 1 application topically See admin instructions. Biofreeze 5% gel topically, apply to back, area of pain three times daily    . oxyCODONE-acetaminophen (PERCOCET) 5-325 MG tablet Take 0.5-1 tablets by mouth every 4 (four) hours as needed. (Patient taking differently: Take 1 tablet by mouth 2 (two) times daily as needed for moderate pain. ) 20 tablet 0  . senna-docusate (SENOKOT-S) 8.6-50 MG tablet Take 1 tablet by mouth at bedtime as needed for mild constipation. (Patient taking differently: Take 1 tablet by mouth at bedtime. ) 30 tablet 0  . warfarin (COUMADIN) 2.5 MG tablet TAKE 1 TABLET BY MOUTH EVERY DAY AT 6 PM 90 tablet 0   Allergies: Allergies as of 10/31/2018 - Review Complete 10/21/2018  Allergen Reaction Noted  . Hydrocodone Nausea And Vomiting and Other (See Comments) 12/26/2017  . Tape Other (See Comments) 04/17/2018   Past Medical History:  Diagnosis Date  . Anemia   . Aortic stenosis   . Bacterial sinusitis 09/17/2011  . CHF (congestive heart failure) ([place])   . CKD (chronic kidney disease) stage 4, GFR 15-29 ml/min ([place]) 08/11/2006   Cr continues to increase. Proteinuria on UA 02/10/12.    . Colitis   . CVA (cerebrovascular accident) [facility])    New hemorrhagic per CT scan '09  . Diverticulosis of colon   . Dysfunctional uterine bleeding   . ESRD (end stage renal disease) on dialysis ([place])    "MWF; [facility]" (11/27/2017)  . Fecal impaction ([place])   . Headache(784.0)   . Heart murmur   . HERNIORRHAPHY, HX OF 08/11/2006  . Hypertension   . OA (osteoarthritis)    bilateral knees  . Postmenopausal   . Pulmonary nodule   . TINEA CRURIS 01/12/2007   Family History  Problem Relation Age of Onset  . Hypertension Mother   . Congestive Heart Failure Mother   . Heart attack Brother 39   Social History: Lives with her granddaughter and daughter. Denies use of EtOH, tobacco, or illict substances  Review of Systems: A complete ROS was negative except as per HPI.   Physical  Exam: Blood pressure (!) 125/58, pulse 99, temperature 97.6 F (36.4 C), temperature source Oral, resp. rate (!) 0, height 5\' 3"  (1.6 m), weight 63.5 kg, SpO2 100 %.  General: Elderly frail female, appears uncomfortable  HENT: Soft swelling on the right temporal region, no tenderness to palpation, no ecchymosis, moist mucus membranes Pulm: Good air movement with no wheezing or crackles  CV: RRR, systolic murmur Abdomen: Active bowel sounds, soft, non-distended, no tenderness to palpation  Extremities: Pulses palpable in all extremities, mild LE edema, large, firm, tender axillary mass  Skin: Warm and dry  Neuro: Alert and oriented x 3, strength 5/5 in the LUE with intact sensation to temperature and touch.   CXR: personally  reviewed my interpretation is of poor inspiration and rotation. Distended stomach. Fluid in the right major fissure and blunting of the costophrenic angle. Aortic arch calcifications.    Assessment & Plan by Problem: Active Problems:   Supratherapeutic INR  Nancy [name] is a 78 year old female with ESRD on HD TRS and recently diagnosed right upper extremity DVT on warfarin who presented to the emergency department with left axillary pain and subsequently found to have a 9.5 x 9.5 x 7.0 cm hematoma in the setting of a supratherapeutic INR. She was give 10 mg Vitamin K, Kcentra, 2 units pRBCs and then admitted for continued evaluation and management.   Left Axillary Hematoma  Supratherapeutic INR  - Initially hypotensive and Hgb at 6.9. S/p 10 mg Vitamin K, Kcentra, 2 units pRBCs, and 500 mL bolus. Vitals have improved.  - Repeat INR after KCentra then in the AM  - CBC s/p transfusion then every 6 hours  - No evidence of compartment syndrome or nerve impingement to warrant emergent surgical intervention.  - Hold warfarin, antiplatelets, and other anticoagulants  - Symptomatic management with Cold compression  - Pain management: IV diluadid in setting of ESRD. Hold  parameters ordered.  - Bowel regimen: Senokot-S  - Antiemetics: Ondansetron   ESRD on HD TRS - Last full session on 4/2 but did get partial session on 4/7 - Labs stable without indication for emergent HD  - Slightly hypervolemic but stable  - Issues in the past with patient pulling out her HD needles and being disruptive necessitating family to sit with her during HD - Midodrine with HD - Have consulted Nephrology   RUE DVT - Diagnosed during last hospitalization and started on warfarin  - Warfarin held due to hematoma  - Spoke with family about risks benefits of continuing anticoagulation. Given bleeding risk would favor not restarting anticoagulation   Goals of Care Failure to thrive - Multiple admission and discussion with palliative care  - Patient is nonadherent to HD schedule, losing weight, and suffering from malnutrition  - Very poor prognosis  - Continue to have discussions with family. Did discuss moving more towards a complete palliative care/hospice approach with no further hospitalizations  - Patient remains DNR  Dementia  - Continue Memantine and Olanzapine   VTE ppx: SCDs in setting of active bleed  Diet: Renal  CODE STATUS: DNR  Dispo: Admit patient to Inpatient with expected length of stay greater than 2 midnights.  Signed: Ina Homes, MD 10/24/2018, 7:38 AM

## 2018-10-16 NOTE — ED Notes (Signed)
It was noted that when patient turned her head to look at the MD, there was some swelling noted to the right side of her head, near her ear. MD aware

## 2018-10-17 DIAGNOSIS — Z86718 Personal history of other venous thrombosis and embolism: Secondary | ICD-10-CM

## 2018-10-17 LAB — TYPE AND SCREEN
ABO/RH(D): A POS
Antibody Screen: NEGATIVE
Unit division: 0

## 2018-10-17 LAB — CBC
HCT: 28.1 % — ABNORMAL LOW (ref 36.0–46.0)
HCT: 22.7 % — ABNORMAL LOW (ref 36.0–46.0)
Hemoglobin: 9 g/dL — ABNORMAL LOW (ref 12.0–15.0)
Hemoglobin: 7.3 g/dL — ABNORMAL LOW (ref 12.0–15.0)
MCH: 28.2 pg (ref 26.0–34.0)
MCH: 28.4 pg (ref 26.0–34.0)
MCHC: 32 g/dL (ref 30.0–36.0)
MCHC: 32.2 g/dL (ref 30.0–36.0)
MCV: 88.1 fL (ref 80.0–100.0)
MCV: 88.3 fL (ref 80.0–100.0)
Platelets: 80 10*3/uL — ABNORMAL LOW (ref 150–400)
Platelets: 80 10*3/uL — ABNORMAL LOW (ref 150–400)
RBC: 3.19 MIL/uL — ABNORMAL LOW (ref 3.87–5.11)
RBC: 2.57 MIL/uL — ABNORMAL LOW (ref 3.87–5.11)
RDW: 17.3 % — ABNORMAL HIGH (ref 11.5–15.5)
RDW: 17.8 % — ABNORMAL HIGH (ref 11.5–15.5)
WBC: 7 10*3/uL (ref 4.0–10.5)
WBC: 6.5 10*3/uL (ref 4.0–10.5)
nRBC: 0 % (ref 0.0–0.2)
nRBC: 0 % (ref 0.0–0.2)

## 2018-10-17 LAB — PROTIME-INR
INR: 1.1 (ref 0.8–1.2)
Prothrombin Time: 14 seconds (ref 11.4–15.2)

## 2018-10-17 LAB — PREPARE RBC (CROSSMATCH)

## 2018-10-17 LAB — BPAM RBC
Blood Product Expiration Date: 202004182359
ISSUE DATE / TIME: 202004100604
Unit Type and Rh: 6200

## 2018-10-17 MED ORDER — SODIUM CHLORIDE 0.9% IV SOLUTION: Freq: Once | INTRAVENOUS | Status: DC

## 2018-10-17 MED ORDER — CALAMINE EX LOTN
TOPICAL_LOTION | CUTANEOUS | Status: DC | PRN
  Administered 2018-10-17: 12:00:00 via TOPICAL
  Filled 2018-10-17: qty 177

## 2018-10-17 NOTE — Progress Notes (Signed)
Received call from lab that Hgb was 7.3 up from 7.2 yesterday, current parameters are not to infuse RBCs.  Notified provider who agreed to hold off on transfusing of 1U RBC's today and awaiting next CBC on 10/18/18.

## 2018-10-17 NOTE — Progress Notes (Signed)
Patient's Rt Fem PICC assessed by Vas/IV Team. Central Line found to be out of patient.  Notified provider, Joni Reining, DO.  IV team removed sutures

## 2018-10-17 NOTE — Progress Notes (Addendum)
Subjective: Ms. Grumbine was doing okay today, no acute events overnight. She reports that she is still having a lot of pain in her arm and she feels like it has not changed. She went for dialysis yesterday and she was upset because it went on for longer then it should have. She denied any other issues today. We discussed the plan for today and she is in agreement.   Objective:  Vital signs in last 24 hours: Vitals:   11/04/2018 2100 10/21/2018 2130 10/10/2018 2146 10/09/2018 2319  BP: (!) 95/49 (!) 99/50 (!) 105/49 (!) 88/49  Pulse: 97 97 96 (!) 51  Resp: 13 14 18 15   Temp:   97.8 F (36.6 C) 97.6 F (36.4 C)  TempSrc:   Oral Oral  SpO2:   100% 97%  Weight:   54.6 kg   Height:        General: Frail appearing female, NAD Cardiac: RRR, systolic murmur Pulmonary: CTABL, no wheezing or rhonchi Abdomen: Soft, non-tender, non-distended Extremity: Large mass measuring about 9x9 cm in size with fluctuance, no drainage, in left axilla, 2 small 1-2 cm masses in right axilla. Pulses intact, warm extremities.    Assessment/Plan:  Active Problems:   Supratherapeutic INR   Axillary hematoma, left, initial encounter   ESRD on hemodialysis [facility])  This is a 78 year old female with ESRD (on hemodialysis, poorly adherent), hypertension, HFpEF, CVA, who was recently admitted for a RUE DVT and started on warfarin who presented with a 1 day history of left axillary pain that started to enlarge. She was noted to be supratheraupetic at 5.4 with a left axillary hematoma. Also found to have normocytic anemia with a Hgb of 6.9. She was treated with Vitamin K, Kcentra, 2 units pRBC.   Left axillary hematoma: Supratherapeutic INR Anemia: Patient received vitamin K, K-centra, 2 units of pRBCs, and 500 cc Bolus yesterday. Hbg imroved up to 11.0, and INR improved to 1.2. This morning patients Hgb is 7.3, yesterday it trended from 11 > 9 > 7.2, so it is stable from last night. Today she reports that she is still  having arm pain, denied any other issues . On exam she still has the hematoma in her left axilla that is now more fluctuant, no drainage or open areas, she also has 2 small masses in the right axilla, she continues to be neurovascularly intact. She had recently been started on warfarin for DVT in the last admission, this is likely the cause of her hematoma.  Will likely need to discontinue warfarin when she is discharged.  We will continue to monitor for signs of bleeding, and trend hemoglobin. -Daily CBC  -Transfuse for Hgb <7 -Continue to hold warfarin, avoid other antiplatelets or anticoagulants -Monitor INR -Cold compression to area -Continue IV dilaudid q2 hr PRN  ESRD on HD TRS: -Full session on 4/2, completed partial session on 4.7. Nephrology following, labs show no need for urgent dialysis. Fistula patent. Midodrine is prescribed to be given with HD for hypotension. Patient has had poor compliance with completing her HD, she has had issues with pulling out her HD needles and needing her family to sit with her. Palliative care has seen her on previous admissions to discuss goals of care, and patient and family elected to continue full scope of care at that time. Since she has still not been completing her dialysis sessions GOC should be readdressed.  -Nephrology following, planned HD today, appreciate assistance -Daily BMP -Palliative care consulted, appreciate assistance  Hypotension: -Patient has a history of hypotension, she is on midodrine with her HD sessions. She had received IVF in the ED but Bps continue to be on the softer side. We will continue to monitor Bps for now.   Hx of RUE DVT:  -Patient has developed a significant hematoma with her warfarin, given her age and other comorbidities we will hold warfarin.   GOC FTT: -Patient has discussed with you multiple, she continues to be nonadherent to hemodialysis having weight loss and.  Patient continues to have poor prognosis.   Will consult palliative care to further discuss options goals of care patient. -Palliative care consulted, appreciate assistance  Dementia: -Continue home amantadine and olanzapine  FEN: No fluids, replete lytes prn, renal diet  VTE ppx: SCDs Code Status: DNR    Dispo: Anticipated discharge in approximately 2-3 day(s).   Asencion Noble, MD 10/17/2018, 6:49 AM Pager: [phone]

## 2018-10-17 NOTE — Progress Notes (Signed)
Patient experiencing extreme itching on back.  Verbal order with reedback placed for calamine lotion.

## 2018-10-17 NOTE — Progress Notes (Signed)
Second IV consult order placed to assess, declot, and change femoral PICC line dressing.  Per provider, needs to be done ASAP.

## 2018-10-17 NOTE — Progress Notes (Signed)
IV consult order placed to assess, declot and change femoral piccline dressing.

## 2018-10-17 NOTE — Progress Notes (Addendum)
Patient encouraged to go to dialysis and patient refused twice.  Notified provider Joni Reining, DO.  Also Notified granddaughter, Baird Lyons of patient's refusal of dialysis today.

## 2018-10-17 NOTE — Progress Notes (Signed)
Came to assess femoral CVC per RN request. Noted central line to be out of patient, however still sutured. Sutures removed, vaseline gauze/gauze dressing applied. Lattie Haw, RN aware. No PIV needed at this time.

## 2018-10-18 DIAGNOSIS — L8990 Pressure ulcer of unspecified site, unspecified stage: Secondary | ICD-10-CM | POA: Diagnosis present

## 2018-10-18 DIAGNOSIS — M79622 Pain in left upper arm: Secondary | ICD-10-CM

## 2018-10-18 DIAGNOSIS — N186 End stage renal disease: Secondary | ICD-10-CM

## 2018-10-18 DIAGNOSIS — R791 Abnormal coagulation profile: Secondary | ICD-10-CM

## 2018-10-18 DIAGNOSIS — Z992 Dependence on renal dialysis: Secondary | ICD-10-CM

## 2018-10-18 LAB — RENAL FUNCTION PANEL
Albumin: 1.8 g/dL — ABNORMAL LOW (ref 3.5–5.0)
Anion gap: 9 (ref 5–15)
BUN: 23 mg/dL (ref 8–23)
CO2: 27 mmol/L (ref 22–32)
Calcium: 7.9 mg/dL — ABNORMAL LOW (ref 8.9–10.3)
Chloride: 101 mmol/L (ref 98–111)
Creatinine, Ser: 3.08 mg/dL — ABNORMAL HIGH (ref 0.44–1.00)
GFR calc Af Amer: 16 mL/min — ABNORMAL LOW (ref >60)
GFR calc non Af Amer: 14 mL/min — ABNORMAL LOW (ref >60)
Glucose, Bld: 83 mg/dL (ref 70–99)
Phosphorus: 4 mg/dL (ref 2.5–4.6)
Potassium: 3.6 mmol/L (ref 3.5–5.1)
Sodium: 137 mmol/L (ref 135–145)

## 2018-10-18 NOTE — Progress Notes (Signed)
Subjective: Nancy [name] is doing okay this morning, she is resting in her bed, no acute events overnight.  She reported that she did go to dialysis yesterday however chart review seems like she did not go to her dialysis.  She reports she still having some left axillary pain but that it is improved from when she came in.  She did not have any new complaints today.  She was insistent on trying to get up out of bed this morning.   Objective:  Vital signs in last 24 hours: Vitals:   10/17/18 1805 10/17/18 1955 10/17/18 2355 10/18/18 0445  BP: (!) 116/55 (!) 69/59 (!) 105/50 117/61  Pulse:  96    Resp:  18    Temp:  97.8 F (36.6 C) 98.1 F (36.7 C) 97.8 F (36.6 C)  TempSrc:  Oral Oral Oral  SpO2:  100%    Weight:      Height:        General: Chronically ill appearing female, frail appearing, NAD Cardiac: Systolic murmur, RRR Pulmonary: CTABL, no wheezing or rhonchi Abdomen: Soft, non-tender, non-distended Extremity: Left axilla shows a large mass measuring about 9x9 cm in size with fluctuance, no drainage, and right axilla showed 1-2 cm masses, non-tender. Pulses have been intact and she has warm extremities.  Assessment/Plan:  Active Problems:   Supratherapeutic INR   Axillary hematoma, left, initial encounter   ESRD on hemodialysis [facility])  This is a 78 year old female with ESRD (on hemodialysis, poorly adherent), hypertension, HFpEF, CVA, who was recently admitted for a RUE DVT and started on warfarin who presented with a 1 day history of left axillary pain that started to enlarge. She was noted to be supratheraupetic at 5.4 with a left axillary hematoma. Also found to have normocytic anemia with a Hgb of 6.9. She was treated with Vitamin K, Kcentra, 2 units pRBC.   Left axillary hematoma: Supratherapeutic INR Anemia: Patient was treated with for her supratherapeutic INR and INR improved down to 1.2, hemoglobin was stable yesterday.  Patient has lost access and due to poor  site access we held off on establishing IV access.  Will need to obtain labs with next hemodialysis session, she did not go to dialysis yesterday. She reported that she would go when her next dialysis session is scheduled.  Palliative care saw her this morning and recommended the nurses contact the daughters to speak with the patient when she is going to dialysis to help convince her.  She had been on warfarin for DVT that was noted on her last admission, given the risk of bleeding and symptomatic anemia it seems that the risk outweighed the benefits of anticoagulation.  Discussed these risks and benefits with the patient's daughter, informed her that the patient will be at a higher risk of developing another blood clot and that the risks of bleeding seem to outweigh the benefits. We decided that we will hold off on warfarin.  -Check CBC and BMP with dialysis -Transfuse for hemoglobin<7 -Continue to hold warfarin, will discontinue at discharge -Monitor INR, will need to check with dialysis -Old compression to area -Continue IV Dilaudid every 2 hours PRN  ESRD on HD TRS: -Full session on 4/2, completed partial session on 4.7. Nephrology following, labs show no need for urgent dialysis. Fistula patent. Midodrine is prescribed to be given with HD for hypotension.  -Patient was scheduled for dialysis yesterday however he refused, palliative care saw her this morning, spoke with patient and the family and  confirmed DNR/DNI status, family wishes to continue with dialysis and she is suggested that the nurses contact patients family when she is to go to dialysis. -Nephrology following, appreciate assistance -Palliative care following, appreciate assistance  Hypotension: -Patient has a history of hypotension, she is on midodrine with her HD sessions -Blood pressures continue to be soft, with episodes of hypotension.  Did not think further management and treatment will be beneficial.  If she becomes severely  symptomatic may give a fluid challenge.  Hx of RUE DVT:  -Patient has developed a significant hematoma with her warfarin, given her age and other comorbidities we will hold warfarin.  -Discussed the risk and benefits of anticoagulation with the patient's family, we will hold warfarin for now.  GOC FTT: -Patient has discussed with palliative care multiple times, she continues to be nonadherent to hemodialysis and has significant weight loss.  Patient continues to have poor prognosis.  -Palliative care following, appreciate assistance -Continue dialysis for now, RN will contact family when she needs to go to dialysis to try to get a partial treatment.    Dementia: -Continue home amantadine and olanzapine  FEN: No fluids, replete lytes prn, renal diet  VTE ppx: SCDs Code Status: DNR    Dispo: Anticipated discharge in approximately 1-2 day(s).   Asencion Noble, MD 10/18/2018, 6:49 AM Pager: [phone]

## 2018-10-18 NOTE — Consult Note (Signed)
Consultation Note Date: 10/18/2018   Patient Name: Nancy [name]  DOB: [birth year]  MRN: [id number]  Age / Sex: 78 y.o., female  PCP: Aldine Contes, MD Referring Physician: Lucious Groves, DO  Reason for Consultation: Establishing goals of care and Psychosocial/spiritual support  HPI/Patient Profile: 78 y.o. female  with past medical history of chronic kidney disease stage V (stage V since 2018), recent starting of hemodialysis with poor compliance, aortic stenosis, hypertension, pulmonary nodule, history of CVA, hyperlipidemia, congestive heart failure admitted on 11/03/2018 with left axillary pain.  Patient was found to have an increased INR upon admission of 6.1..   Palliative medicine team has met with Nancy and her family multiple times starting in 2018, specifically regarding dialysis and in the past CODE STATUS.  Since meeting with family we have obtained a care limit of DNR  Clinical Assessment and Goals of Care: Patient seen, chart reviewed.  Patient is oriented to herself, she recognizes she is in the hospital but otherwise is confused, has underlying dementia.  She has been seen by psychiatric services during previous hospitalizations and deemed not have capacity.  Patient has for years has said she would not undergo hemodialysis but she has recently started hemodialysis at the urging of her family.  She has very poor compliance even while at home.  She does not seem to be as agitated as she has been under previous hospitalizations.  Her daughter states she has been better behaved in the dialysis center.. She has recently been discharged from [facility] and has been back in the home with both her daughter and granddaughter for about 3 weeks now.  Susie, her daughter, states that they manage her medications.  Nancy also has a home health nurse coming into the home at present.  Family recognizes  that intermittent dialysis is less than optimal.  Daughter [name] states they however not ready to stop.  I also told Susie that she is no longer a candidate, per chart review, for further anticoagulation.  The risk of bleeding appear to be outweighing the benefits at this point.  She verbalized understanding.  Patient is unable to make healthcare decisions for herself secondary to dementia.  Her daughter, [name], at [phone] is her healthcare proxy.  Nancy lives with [name] and Susie's daughter, [name] who is a very active role in her healthcare.  [name] can be reached at [phone]   This is been a very difficult situation for patient as well as patient's daughter and granddaughter.  During previous goals of care is meeting held with some of patient's sisters, there have been very strong opinions regarding continuing with hemodialysis   SUMMARY OF RECOMMENDATIONS   DNR DNI.  This is care limit at this point Family wishes to continue with dialysis despite the fact that intermittent dialysis is not  very beneficial for patient Patient will be more compliant with dialysis if her family is on the phone when they come to get her and tell her to go.  I have called  the bedside RN to instruct him to call family and have them tell her she needs to go to dialysis which hopefully will enable her to have at least a partial treatment. I explained to family that she is no longer a candidate for anticoagulation; risk of bleeding are outweighing benefits given her admitting supra therapeutic INR This is a difficult situation.  [name] will continue to return to the hospital until it becomes clear that she is at end-of-life Patient and family are opposed to hospice at this point.  Additionally, she would not be a hospice candidate as long as she is continuing dialysis Code Status/Advance Care Planning:  DNR    Symptom Management:   Pain: Patient is verbalizing pain to her upper extremities.   Continue with Dilaudid 0.5 mg IV every 2 hours as needed.  Monitor for need for scheduled dosing.  Upon discharge would recommend comparable dose of oxycodone given her renal failure morphine would be contraindicated  Palliative Prophylaxis:   Aspiration, Bowel Regimen, Delirium Protocol, Eye Care, Frequent Pain Assessment, Oral Care and Turn Reposition  Additional Recommendations (Limitations, Scope, Preferences):  Initiate Comfort Feeding, No Artificial Feeding, No Surgical Procedures and No Tracheostomy  Psycho-social/Spiritual:   Desire for further Chaplaincy support:no  Additional Recommendations: Referral to Community Resources   Prognosis:   Unable to determine  Discharge Planning: Home with Home Health      Primary Diagnoses: Present on Admission: . Supratherapeutic INR   I have reviewed the medical record, interviewed the patient and family, and examined the patient. The following aspects are pertinent.  Past Medical History:  Diagnosis Date  . Anemia   . Aortic stenosis   . Bacterial sinusitis 09/17/2011  . CHF (congestive heart failure) ([place])   . CKD (chronic kidney disease) stage 4, GFR 15-29 ml/min ([place]) 08/11/2006   Cr continues to increase. Proteinuria on UA 02/10/12.    . Colitis   . CVA (cerebrovascular accident) [facility])    New hemorrhagic per CT scan '09  . Diverticulosis of colon   . Dysfunctional uterine bleeding   . ESRD (end stage renal disease) on dialysis ([place])    "MWF; [facility] [address]" (11/27/2017)  . Fecal impaction ([place])   . Headache(784.0)   . Heart murmur   . HERNIORRHAPHY, HX OF 08/11/2006  . Hypertension   . OA (osteoarthritis)    bilateral knees  . Postmenopausal   . Pulmonary nodule   . TINEA CRURIS 01/12/2007   Social History   Socioeconomic History  . Marital status: Widowed    Spouse name: Not on file  . Number of children: Not on file  . Years of education: Not on file  . Highest education level: Not on file  Occupational History   . Not on file  Social Needs  . Financial resource strain: Not on file  . Food insecurity:    Worry: Never true    Inability: Never true  . Transportation needs:    Medical: No    Non-medical: No  Tobacco Use  . Smoking status: Never Smoker  . Smokeless tobacco: Never Used  Substance and Sexual Activity  . Alcohol use: Not on file  . Drug use: No    Comment: 08/15/08 UDS + cocaine  . Sexual activity: Not Currently  Lifestyle  . Physical activity:    Days per week: 0 days    Minutes per session: 0 min  . Stress: Not on file  Relationships  . Social connections:    Talks  on phone: More than three times a week    Gets together: More than three times a week    Attends religious service: More than 4 times per year    Active member of club or organization: No    Attends meetings of clubs or organizations: Never    Relationship status: Widowed  Other Topics Concern  . Not on file  Social History Narrative   Member is widowed, has 1 daughter and 1 granddaughter and 3 great grandchildren      Family History  Problem Relation Age of Onset  . Hypertension Mother   . Congestive Heart Failure Mother   . Heart attack Brother 80   Scheduled Meds: . sodium chloride   Intravenous Once  . sodium chloride   Intravenous Once  . calcitRIOL  2 mcg Oral Q M,W,F-HD  . Chlorhexidine Gluconate Cloth  6 each Topical Q0600  . feeding supplement (NEPRO CARB STEADY)  237 mL Oral BID BM  . memantine  5 mg Oral BID  . midodrine  10 mg Oral Q T,Th,Sa-HD  . midodrine  10 mg Oral Once in dialysis  . mirtazapine  15 mg Oral QHS  . OLANZapine  2.5 mg Oral QHS  . senna-docusate  1 tablet Oral QHS   Continuous Infusions: PRN Meds:.acetaminophen, calamine, HYDROmorphone (DILAUDID) injection, ondansetron Medications Prior to Admission:  Prior to Admission medications   Medication Sig Start Date End Date Taking? Authorizing Provider  acetaminophen (TYLENOL) 325 MG tablet Take 2 tablets (650 mg total)  by mouth 2 (two) times daily as needed for mild pain, moderate pain, fever or headache. 10/07/18  Yes Carroll Sage, MD  latanoprost (XALATAN) 0.005 % ophthalmic solution Place 1 drop into both eyes at bedtime. 04/11/18  Yes [provider]  OLANZapine (ZYPREXA) 2.5 MG tablet Take 1 tablet (2.5 mg total) by mouth at bedtime. 07/29/18  Yes Dorrell, [name], MD  warfarin (COUMADIN) 2.5 MG tablet TAKE 1 TABLET BY MOUTH EVERY DAY AT 6 PM Patient taking differently: Take 2.5 mg by mouth daily at 6 PM.  10/09/18  Yes Aldine Contes, MD  aspirin 81 MG EC tablet Take 1 tablet (81 mg total) by mouth daily. Patient not taking: Reported on 10/29/2018 05/14/18   [name], MD  calcitRIOL (ROCALTROL) 0.5 MCG capsule Take 4 capsules (2 mcg total) by mouth every Monday, Wednesday, and Friday with hemodialysis. Patient not taking: Reported on 10/23/2018 12/30/17   Neva Seat, MD  cefdinir (OMNICEF) 300 MG capsule Take 300 mg by mouth daily.    [provider]  dextrose (GLUTOSE) 40 % GEL Take 37.5 g by mouth once as needed for low blood sugar. Patient not taking: Reported on 10/28/2018 10/07/18   Carroll Sage, MD  diphenhydrAMINE-zinc acetate (BENADRYL) cream Apply topically 3 (three) times daily as needed for itching. Patient not taking: Reported on 06/23/2018 04/20/18   Masoudi, Dorthula Rue, MD  enoxaparin (LOVENOX) 60 MG/0.6ML injection Inject 0.5 mLs (50 mg total) into the skin daily for 3 days. Patient not taking: Reported on 10/15/2018 10/07/18 10/10/18  Carroll Sage, MD  midodrine (PROAMATINE) 10 MG tablet Take 1 tablet (10 mg total) by mouth daily. Take on dialysis days. Patient not taking: Reported on 10/10/2018 07/29/18   Dorrell, [name], MD  multivitamin (RENA-VIT) TABS tablet Take 1 tablet by mouth at bedtime. Patient not taking: Reported on 10/03/2018 07/29/18   Dorrell, [name], MD  Nutritional Supplements (FEEDING SUPPLEMENT, NEPRO CARB STEADY,) LIQD Take 376  mLs  by mouth 2 (two) times daily between meals for 30 days. 10/08/18 11/07/18  Carroll Sage, MD  ondansetron (ZOFRAN-ODT) 4 MG disintegrating tablet Take 1 tablet (4 mg total) by mouth 2 (two) times daily as needed for nausea or vomiting. Patient not taking: Reported on 10/23/2018 10/07/18   Carroll Sage, MD  oxyCODONE-acetaminophen (PERCOCET) 5-325 MG tablet Take 0.5-1 tablets by mouth every 4 (four) hours as needed. Patient not taking: Reported on 10/25/2018 05/14/18   Masoudi, Dorthula Rue, MD  senna-docusate (SENOKOT-S) 8.6-50 MG tablet Take 1 tablet by mouth at bedtime as needed for mild constipation. Patient not taking: Reported on 10/15/2018 05/14/18   [name], MD   Allergies  Allergen Reactions  . Hydrocodone Nausea And Vomiting and Other (See Comments)    Dizziness, also  . Tape Other (See Comments)    TAPE LEAVES DARK PATCHES ON THE SKIN!!   Review of Systems  Unable to perform ROS: Dementia    Physical Exam Vitals signs and nursing note reviewed.  Constitutional:      Appearance: She is ill-appearing.     Comments: Cachectic, elderly female; confused, irritable  HENT:     Head: Normocephalic and atraumatic.  Neck:     Musculoskeletal: Normal range of motion.  Cardiovascular:     Rate and Rhythm: Tachycardia present.  Pulmonary:     Effort: Pulmonary effort is normal.  Skin:    General: Skin is warm and dry.     Comments: Erythema to right upper extremity, swelling noted large area of bruising  Neurological:     Comments: Patient is oriented to herself and that she is in the hospital She is confused  Psychiatric:     Comments: Irritable but no overt agitation otherwise unable to perform mental status exam She is oriented to herself and that she is in the hospital     Vital Signs: BP 117/61 (BP Location: Right Arm)   Pulse 96   Temp 97.8 F (36.6 C) (Oral)   Resp 18   Ht _0  (1.6 m)   Wt 54.6 kg   SpO2 100%   BMI 21.32 kg/m  Pain Scale: 0-10    Pain Score: Asleep   SpO2: SpO2: 100 % O2 Device:SpO2: 100 % O2 Flow Rate: .   IO: Intake/output summary:   Intake/Output Summary (Last 24 hours) at 10/18/2018 1126 Last data filed at 10/17/2018 2055 Gross per 24 hour  Intake 85 ml  Output -  Net 85 ml    LBM: Last BM Date: (pta, unknown) Baseline Weight: Weight: 63.5 kg Most recent weight: Weight: 54.6 kg     Palliative Assessment/Data:   Flowsheet Rows     Most Recent Value  Intake Tab  Referral Department  Hospitalist  Unit at Time of Referral  Intermediate Care Unit  Palliative Care Primary Diagnosis  Cardiac  Date Notified  10/17/18  Palliative Care Type  Return patient Palliative Care  Reason for referral  Clarify Goals of Care, Psychosocial or Spiritual support  Date of Admission  10/19/2018  Date first seen by Palliative Care  10/18/18  # of days Palliative referral response time  1 Day(s)  # of days IP prior to Palliative referral  1  Clinical Assessment  Pain Max last 24 hours  Not able to report  Pain Min Last 24 hours  Not able to report  Dyspnea Max Last 24 Hours  Not able to report  Dyspnea Min Last 24 hours  Not able  to report  Nausea Max Last 24 Hours  Not able to report  Nausea Min Last 24 Hours  Not able to report  Anxiety Max Last 24 Hours  Not able to report  Anxiety Min Last 24 Hours  Not able to report  Other Max Last 24 Hours  Not able to report  Psychosocial & Spiritual Assessment  Palliative Care Outcomes  Patient/Family meeting held?  Yes  Who was at the meeting?  dtr [name] via phone  Palliative Care Outcomes  Provided psychosocial or spiritual support, Clarified goals of care  Patient/Family wishes: Interventions discontinued/not started   Mechanical Ventilation, PEG  Palliative Care follow-up planned  Yes, Facility      Time In: 1015 Time Out: 1125 Time Total: 70 min Greater than 50%  of this time was spent counseling and coordinating care related to the above assessment and  plan.  Due to QLRJP-36, no visitor policy, conversations were held with daughter via phone  Signed by: Dory Horn, NP   Please contact Palliative Medicine Team phone at [phone] for questions and concerns.  For individual provider: See Shea Evans

## 2018-10-19 LAB — CBC
HCT: 25.1 % — ABNORMAL LOW (ref 36.0–46.0)
Hemoglobin: 8 g/dL — ABNORMAL LOW (ref 12.0–15.0)
MCH: 29 pg (ref 26.0–34.0)
MCHC: 31.9 g/dL (ref 30.0–36.0)
MCV: 90.9 fL (ref 80.0–100.0)
Platelets: 143 10*3/uL — ABNORMAL LOW (ref 150–400)
RBC: 2.76 MIL/uL — ABNORMAL LOW (ref 3.87–5.11)
RDW: 18.3 % — ABNORMAL HIGH (ref 11.5–15.5)
WBC: 4.5 10*3/uL (ref 4.0–10.5)
nRBC: 0 % (ref 0.0–0.2)

## 2018-10-19 LAB — PROTIME-INR
INR: 1.1 (ref 0.8–1.2)
Prothrombin Time: 14.3 seconds (ref 11.4–15.2)

## 2018-10-19 MED ORDER — CHLORHEXIDINE GLUCONATE CLOTH 2 % EX PADS
6.0000 | MEDICATED_PAD | Freq: Every day | CUTANEOUS | Status: DC
  Administered 2018-10-20: 06:00:00 6 via TOPICAL

## 2018-10-19 NOTE — Progress Notes (Signed)
   Subjective: No overnight events. This morning Nancy [name] states she is on the bus. She denies any pain or discomfort. She states she is not hungry. She felt her left axillary hematoma was draining and reports her left arm still feels swollen. States her right arm feels okay.   Objective:  Vital signs in last 24 hours: Vitals:   10/18/18 1500 10/18/18 1634 10/18/18 1921 10/19/18 0055  BP:  (!) 106/56 (!) 118/55 (!) 116/51  Pulse:  95 95 100  Resp: 13 14    Temp:  (!) 97.4 F (36.3 C) 97.8 F (36.6 C) 98 F (36.7 C)  TempSrc:  Oral Oral Oral  SpO2:  100% 100% 99%  Weight:      Height:       Physical Exam General: seen comfortably laying in bed, not oriented to place  CV: holosystolic murmur, RRR Ext: left axillary hematoma with fluctuance, no drainage, left arm edema, warm to touch  Assessment/Plan:  Principal Problem:   Axillary hematoma, left, initial encounter Active Problems:   Supratherapeutic INR   ESRD on hemodialysis (HCC)   Left axillary pain   Pressure injury of skin  Nancy [name] is a 78 year old female with ESRD(on hemodialysis, poorly adherent),hypertension,HFpEF, CVA, who was recently admitted for a RUE DVT and started on warfarin who presented with a 1 day history of left axillary pain that started to enlarge. She was noted to be supratheraupetic at 5.4 with a left axillary hematoma. Also found to have normocytic anemia with a Hgb of 6.9. She was treated with Vitamin K, Kcentra, 1 units pRBC.   Left axillary hematoma: Supratherapeutic INR Anemia: Patent's INR improved to 1.2. She does not have IV access and plan is to obtain labs with next HD. Her last HD was on 4/10. Palliative care recommended family call and help instruct patient to go to HD. She is not a candidate for further anticoagulation, discontinued warfarin. Once we are able to repeat her labs to check if her CBC is stable and INR she will be medially stable for discharge.  -Check CBC and BMP with  dialysis -Transfuse for hemoglobin<7 -Monitor INR, will need to check with dialysis -Continue IV Dilaudid every 2 hours PRN  ESRD on HD TRS: fistula patent. Midodrine is prescribed to be given with HD for hypotension.  -Patient was scheduled for dialysis 4/11 however he refused, palliative care suggested that the nurses contact patients family when she is to go to dialysis -Nephrology following, appreciate assistance -Palliative care following, appreciate assistance  Hx of RUE DVT: Patient has developed a significant hematoma with her warfarin, given her age and other comorbidities we will discontinue warfarin.  GOC FTT: Patient has discussed with palliative care multiple times, she continues to be nonadherent to hemodialysis and has significant weight loss. Patient continues to have poor prognosis. Patient and family opposed to hospice at this point.  -Palliative care following, appreciate assistance -Continue dialysis for now, RN will contact family when she needs to go to dialysis to try to get a partial treatment.     FEN:No fluids, replete lytes prn,renaldiet  VTE IRC:VELF Code Status: DNR   Dispo: Anticipated discharge in 1-2 days, pending lab results.   Mike Craze, DO 10/19/2018, 6:25 AM Pager: [phone]

## 2018-10-19 NOTE — Progress Notes (Signed)
Pt continues to refuse to wear telemetry box. Pt educated, but is confused. Discussed with MD.

## 2018-10-19 NOTE — Progress Notes (Signed)
[facility] Progress Note  Subjective: in bed, confused and perseverating, not in distress, yells "Get me up!" over and over  Vitals:   10/18/18 1921 10/19/18 0055 10/19/18 0837 10/19/18 1045  BP: (!) 118/55 (!) 116/51 122/65   Pulse: 95 100 78   Resp:   18   Temp: 97.8 F (36.6 C) 98 F (36.7 C) (!) 97.4 F (36.3 C) 97.6 F (36.4 C)  TempSrc: Oral Oral Oral Oral  SpO2: 100% 99% 99%   Weight:      Height:        Inpatient medications: . sodium chloride   Intravenous Once  . sodium chloride   Intravenous Once  . calcitRIOL  2 mcg Oral Q M,W,F-HD  . Chlorhexidine Gluconate Cloth  6 each Topical Q0600  . feeding supplement (NEPRO CARB STEADY)  237 mL Oral BID BM  . memantine  5 mg Oral BID  . midodrine  10 mg Oral Q T,Th,Sa-HD  . midodrine  10 mg Oral Once in dialysis  . mirtazapine  15 mg Oral QHS  . OLANZapine  2.5 mg Oral QHS  . senna-docusate  1 tablet Oral QHS    acetaminophen, calamine, HYDROmorphone (DILAUDID) injection, ondansetron Exam: General: thin elderly female oriented x 1 Ext: 2+ generalized edema  Heart:RRR no mrg Lungs:- dim bases o/w clear Abdomen: soft NT +BS Extremities: + LE edema, no wounds Dialysis Access: left AVF+ bruit  Dialysis Orders: [facility]  3.5h  42.5kg  3K/2Ca bath P4  AVF  Hep none - Mircera 100 q 2 weeks (last 3/28)  Assessment/Plan: 1. Left axillary hematoma/ Jeannie Done - INR better at 1.2.  Not candidate for anticoag anymore per primary team, warfarin cd'd.  2. ESRD:Usually HD TTS schedule but under-dialyzed due to early sign-offs. For HD today off schedule, then next HD Thursday if still here 3. HTN/ volume - pt sig vol overloaded on exam, pull vol down as bp allows 4. Anemia:Hgb 10.2- just given ESA as outpatient - follow. 5. Metabolic bone disease:Corr Ca 11 - hold calcitriol for now - off binders at present P 5.5 - follow 6. Hx CVA 7. Severe dementia 8. Adult FTT - see above, not tolerating HD as  outpatient - crying/demanding to sign off early nearly every treatment- never runs more than 2 hours and skips treatments. Palliative care consulted, patient remains DNR. No new limits to care from the meeting.  9. Thrombocytopenia - trending up 10. Malnutrition - alb very low - 1.3 though seems to be gaining some body weight- dit + vits/supplements 11. Right UE DVT - provoked - for 3 months coumadin - high risk patient - SNF will need to follow INRs     [facility] 10/19/2018, 12:35 PM  Iron/TIBC/Ferritin/ %Sat    Component Value Date/Time   IRON 33 004/30/202018 0847   TIBC 202 (L) 10/15/2016 0847   FERRITIN 127 10/15/2016 0847   IRONPCTSAT 16 10/15/2016 0847   IRONPCTSAT 12 (L) 02/09/2014 0909   Recent Labs  Lab 10/17/18 0525  NA 137  K 3.6  CL 101  CO2 27  GLUCOSE 83  BUN 23  CREATININE 3.08*  CALCIUM 7.9*  PHOS 4.0  ALBUMIN 1.8*  INR 1.1   Recent Labs  Lab 10/16/18 0243  AST 13*  ALT 8  ALKPHOS 66  BILITOT 0.3  PROT 3.5*   Recent Labs  Lab 10/17/18 0725  WBC 6.5  HGB 7.3*  HCT 22.7*  PLT 80*  Dialysis Orders: TTS at [facility] 3:30hr, 400/A1.5, EDW 42.5kg, 3K/2Ca, profile #4, AVF, no heparin - Mircera 100 q 2 weeks (last 3/28)  Assessment/Plan: 12. Syncopal episode: on outpatient HD 3/28 - per primary. 13. L HCAP with effusion: transitioned to oral atbx. 14. ESRD:Usually HD TTS schedule but under-dialyzed in general (see HPI). Poorly tolerates dialysis with intermittent yelling though calm this am. HD today and then can transition back to TTS schedule on Sat.K 3.8 use 3 K bath 15. Hypertension/volume:BP variable. Breathing comfortably, but with some LE edema/volume on imaging. 6 - 10 above EDW - likely to have gained some weight in SNF. Needs EDW raised - unable to UF any fluid Monday. Raise EDW for d/c but has some generalized edema that we are not able to UF due to low BP - will use alb for BP support but this is only  putting a bandaid on the greater issue of FTT alb c 1 for BP support plus midodrine 16. Anemia:Hgb 10.2- just given ESA as outpatient - follow. 17. Metabolic bone disease:Corr Ca 11 - hold calcitriol for now - off binders at present P 5.5 - follow 18. Hx CVA 19. severe dementia 20. Adult FTT - see above, not tolerating HD as outpatient - crying/demanding to sign off early nearly every treatment- never runs more than 2 hours and skips treatments. Pls consider palliative input. Her daughter was not available during admission. patient too demented to have meaningful conversation. We are doing custodial care.  She is not appropriate for long term dialysis. 21. Thrombocytopenia - trending up 22. Malnutrition - alb very low - 1.3 though seems to be gaining some body weight- dit + vits/supplements 23. Right UE DVT - provoked - for 3 months coumadin - high risk patient - SNF will need to follow INRs  Myriam Jacobson, PA-C [place] 10/07/2018,8:20 AM  LOS: 4 days   Pt seen, examined and agree w A/P as above.  [facility] 10/07/2018, 3:50 PM    Subjective:  Seen on dialysis. Insists that her [birth year] was yesterday [birth year]  Objective       Vitals:   10/07/18 0700 10/07/18 0705 10/07/18 0710 10/07/18 0730  BP: (!) 117/39 (!) 122/54 (!) 122/39 (!) 100/38  Pulse: (!) 102 98 99 (!) 104  Resp: 16     Temp: 98 F (36.7 C)     TempSrc: Oral     SpO2: 98%     Weight: 51.5 kg     Height:       Physical Exam goal 2.2

## 2018-10-19 NOTE — Care Management Important Message (Signed)
Important Message  Patient Details  Name: Nancy [name] MRN: [id number] Date of Birth: 07-22-1940   Medicare Important Message Given:  Yes    Jaryd Drew Montine Circle 10/19/2018, 4:31 PM

## 2018-10-19 NOTE — Progress Notes (Addendum)
  Date: 10/19/2018  Patient name: Nancy [name]  Medical record number: [id number]  Date of birth: 1941-05-22   I have seen and evaluated this patient and I have discussed the plan of care with the house staff. Please see their note for complete details. I concur with their findings with the following additions/corrections:   Confused at this morning, asks [place] to be quick because she has to get on the bus to go home.  Left axillary hematoma remains large, but hemoglobin is stable.  Thrombocytopenia has stabilized.  Despite her upper extremity DVT, not a candidate for anticoagulation due to her hematoma and anemia.  Still a challenge to get her dialyzed, but no change in therapeutic plan at this time.  As her hemoglobin is stable, probably okay for discharge tomorrow after she receives dialysis today.  [name], M.D., Ph.D. 10/19/2018, 2:22 PM  ADDENDUM: For documentation purposes, please note she has severe protein-calorie malnutrition.

## 2018-10-19 NOTE — Progress Notes (Signed)
Pt continues to refuse to wear telemetry box and 1900 vitals. Pt educated, but is confused. MD is aware. Will reattempt to take vitals at a later time.

## 2018-10-20 ENCOUNTER — Encounter (HOSPITAL_COMMUNITY): Payer: Self-pay

## 2018-10-20 DIAGNOSIS — I469 Cardiac arrest, cause unspecified: Secondary | ICD-10-CM

## 2018-10-20 LAB — PREPARE RBC (CROSSMATCH)

## 2018-10-20 LAB — RENAL FUNCTION PANEL
Albumin: 1.7 g/dL — ABNORMAL LOW (ref 3.5–5.0)
Anion gap: 12 (ref 5–15)
BUN: 37 mg/dL — ABNORMAL HIGH (ref 8–23)
CO2: 24 mmol/L (ref 22–32)
Calcium: 8.3 mg/dL — ABNORMAL LOW (ref 8.9–10.3)
Chloride: 101 mmol/L (ref 98–111)
Creatinine, Ser: 4.29 mg/dL — ABNORMAL HIGH (ref 0.44–1.00)
GFR calc Af Amer: 11 mL/min — ABNORMAL LOW (ref >60)
GFR calc non Af Amer: 9 mL/min — ABNORMAL LOW (ref >60)
Glucose, Bld: 69 mg/dL — ABNORMAL LOW (ref 70–99)
Phosphorus: 5.3 mg/dL — ABNORMAL HIGH (ref 2.5–4.6)
Potassium: 4.2 mmol/L (ref 3.5–5.1)
Sodium: 137 mmol/L (ref 135–145)

## 2018-10-20 LAB — CBC
HCT: 21.9 % — ABNORMAL LOW (ref 36.0–46.0)
Hemoglobin: 6.8 g/dL — CL (ref 12.0–15.0)
MCH: 28.5 pg (ref 26.0–34.0)
MCHC: 31.1 g/dL (ref 30.0–36.0)
MCV: 91.6 fL (ref 80.0–100.0)
Platelets: 125 10*3/uL — ABNORMAL LOW (ref 150–400)
RBC: 2.39 MIL/uL — ABNORMAL LOW (ref 3.87–5.11)
RDW: 17.8 % — ABNORMAL HIGH (ref 11.5–15.5)
WBC: 4.1 10*3/uL (ref 4.0–10.5)
nRBC: 0 % (ref 0.0–0.2)

## 2018-10-20 MED ORDER — SODIUM CHLORIDE 0.9% IV SOLUTION: Freq: Once | INTRAVENOUS | Status: DC

## 2018-10-20 MED ORDER — MIDODRINE HCL 5 MG PO TABS
ORAL_TABLET | ORAL | Status: AC
  Administered 2018-10-20: 09:00:00 10 mg via ORAL
  Filled 2018-10-20: qty 2

## 2018-10-20 MED ORDER — CALCITRIOL 0.5 MCG PO CAPS
ORAL_CAPSULE | ORAL | Status: AC
  Administered 2018-10-20: 2 ug via ORAL
  Filled 2018-10-20: qty 4

## 2018-10-21 ENCOUNTER — Other Ambulatory Visit: Payer: Self-pay

## 2018-10-21 LAB — TYPE AND SCREEN
ABO/RH(D): A POS
Antibody Screen: NEGATIVE
Unit division: 0

## 2018-10-21 LAB — BPAM RBC
Blood Product Expiration Date: 202004242359
Unit Type and Rh: 6200

## 2018-10-28 ENCOUNTER — Ambulatory Visit (HOSPITAL_COMMUNITY): Payer: Medicare Other

## 2018-11-06 NOTE — Progress Notes (Signed)
Called to see patient, she had finished 3/4 of HD and was found not responsive , not breathing.  Pt DNR.  Examined patient, no respirations, no pulse or heart sounds.  Patient expired. Have notified primary service.   [facility] 22-Oct-2018, 11:48 AM

## 2018-11-06 NOTE — Death Summary Note (Signed)
  Name: Nancy [name] MRN: [id number] DOB: 01-Dec-1940 78 y.o.  Date of Admission: 11/01/2018  2:01 AM Date of Discharge: Oct 29, 2018 Attending Physician: Oda Kilts, MD  Discharge Diagnosis: Principal Problem:   Axillary hematoma, left, initial encounter Active Problems:   Supratherapeutic INR   ESRD on hemodialysis (HCC)   Left axillary pain   Pressure injury of skin   Cause of death: Cardiac Arrest 2/2 ESRD, Anemia and valvular heart disease  Time of death: 7473 am  Disposition and follow-up:   [name] was discharged from [facility] in expired condition.    Hospital Course:  Ms. Head is a61 year old female with ESRD(on hemodialysis, poorly adherent),hypertension,HFpEF, CVA, who was recently admitted for a RUE DVT and started on warfarin who presented with a 1 day history of left axillary pain that started to enlarge. She was noted to be supratheraupetic at 5.4 with a left axillary hematoma. Also found to have normocytic anemia with a Hgb of 6.9. She was treated with Vitamin K, Kcentra,1units pRBC. Her INR improved to 1.2 and hemoglobin improved to 8.0. Warfarin was discontinued. She declined HD on 4/11 and 4/13. She went for HD on 4/14 and was about 3/4 through her session when she was found to be unresponsive, not breathing with no pulse or heart sounds. Time of death was 1135 am.    Signed: Ulmer Degen, Charlsie Quest, DO 10-29-18, 12:13 PM

## 2018-11-06 NOTE — Procedures (Signed)
   I was present at this dialysis session, have reviewed the session itself and made  appropriate changes Kelly Splinter MD [place] pager [phone]   11/02/18, 10:30 AM

## 2018-11-06 NOTE — Progress Notes (Signed)
Pt was sent to dialysis this am for treatment. This RN was notified that pt had passed during dialysis session. Time of death was reported at 11:35. AC has been notified. Pt's body was brought back to pt's room on 4e. Family has been notified by pt's doctor.   Ara Kussmaul BSN, RN

## 2018-11-06 NOTE — Progress Notes (Signed)
Pt. Arterial pressure alarm activated. This nurse at bedside to assess. Pt noted agonal breathing. Oxygen applied. BP obtained on left leg 131/114. Placed on telemetry monitor SB 52 with weak pulse. Pt brady to 30s on monitor with no pulse or audile heart sounds. Dr. Jonnie Finner called to bedside to assess. Pt. Pronounce at 1135 via Dr. Jonnie Finner

## 2018-11-06 NOTE — Telephone Encounter (Signed)
Called [facility] to see if we need to do anything about pt's PT/INR - no answer; left message.

## 2018-11-06 NOTE — Progress Notes (Addendum)
   Subjective: Patient has been refusing telemetry. Seen during HD this morning, comfortably sleeping.  Objective:  Vital signs in last 24 hours: Vitals:   10/19/18 1045 10/19/18 2128 11/10/18 0300 11-10-2018 0400  BP:  (!) 129/52 (!) 120/51   Pulse:  99 76   Resp:      Temp: 97.6 F (36.4 C)   97.6 F (36.4 C)  TempSrc: Oral   Axillary  SpO2:  100% 93%   Weight:      Height:       Physical Exam Gen: seen comfortably sleeping during HD, no distress Ext: left axillary hematoma with fluctuance, no drainage, left arm edema, warm and well perfused    Assessment/Plan:  Principal Problem:   Axillary hematoma, left, initial encounter Active Problems:   Supratherapeutic INR   ESRD on hemodialysis (HCC)   Left axillary pain   Pressure injury of skin  Nancy [name] is a 78 year old female with ESRD(on hemodialysis, poorly adherent),hypertension,HFpEF, CVA, who was recently admitted for a RUE DVT and started on warfarin who presented with a 1 day history of left axillary pain that started to enlarge. She was noted to be supratheraupetic at 5.4 with a left axillary hematoma. Also found to have normocytic anemia with a Hgb of 6.9. She was treated with Vitamin K, Kcentra, 1 units pRBC.   Left axillary hematoma Supratherapeutic INR Anemia: Patent's INR stable at 1.1, Hgb stable 8.0. She is not a candidate for further anticoagulation, discontinued warfarin. Recommend to continue cold compression to hematoma  -Continue IV Dilaudid every 2 hours PRN  ESRD on HD TRS: fistula patent. Midodrine is prescribed to be given with HD for hypotension. -Patient is medically stable for discharge after HD today. To continue T/Th/Sat schedule  -Nephrology following, appreciate assistance -Palliative carefollowing, appreciate assistance  Hx of RUE DVT: Patient has developed a significant hematoma with her warfarin, given her age and other comorbidities we will discontinue warfarin.   Dispo:  Patient is medically stable for discharge today after HD.   Nancy [name] N, DO 2018/11/10, 7:12 AM Pager: [phone]

## 2018-11-06 NOTE — Progress Notes (Signed)
This RN was called to pt's room. Pt's family questioning a bruise on pt's right arm. This RN explained to the family that the pt came in with a hematoma to the right arm and that the bruising was likely due to the hematoma. Pt's family expressed understanding and stated that they had no more questions. This RN offered emotional support to the family.

## 2018-11-06 DEATH — deceased

## 2019-03-30 IMAGING — CT CT HEAD W/O CM
4 series · 16 of 47 positions shown, 18 images · non-contrast
Comparison: 02/17/2017

CLINICAL DATA: Injury to posterior head.  Initial encounter.

EXAM:
CT HEAD WITHOUT CONTRAST
TECHNIQUE: Contiguous axial images were obtained from the base of the skull
through the vertex without intravenous contrast.

[Series 3: head wo · axial · 0.39mm/px · z∈[-109,+11]mm · 7 of 33 slices shown, 9 images]
[im 5/33  brain]
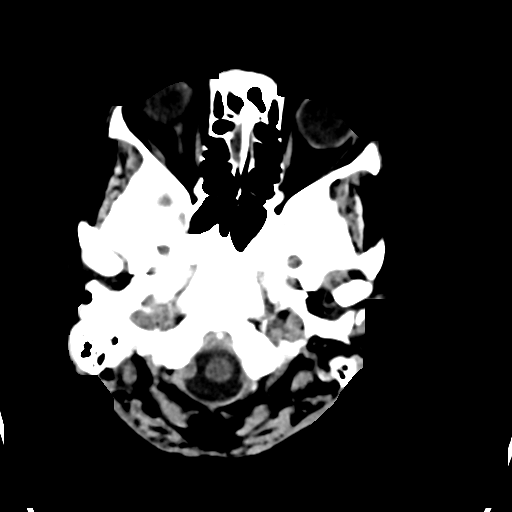
[im 5/33  bone]
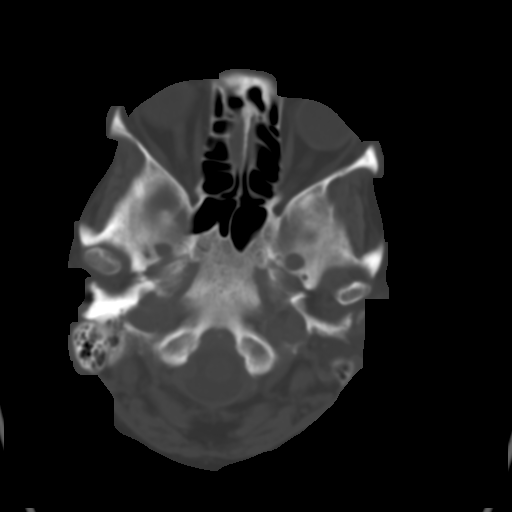
[im 9/33  brain]
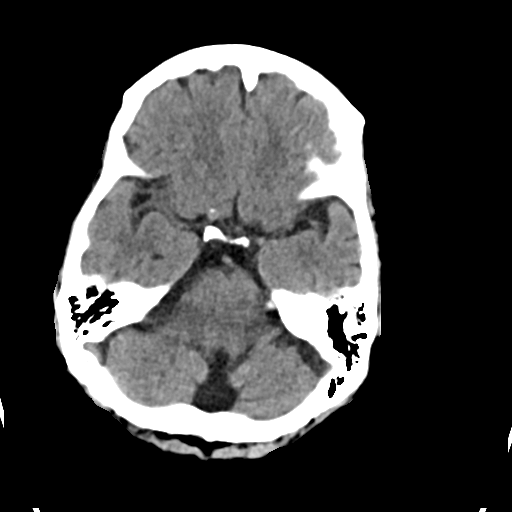
[im 13/33  brain]
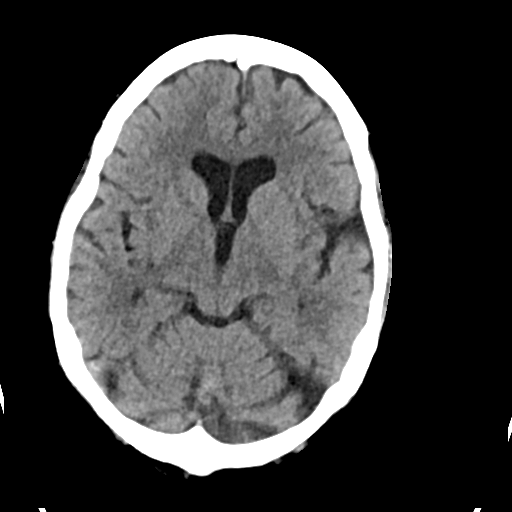
[im 17/33  brain]
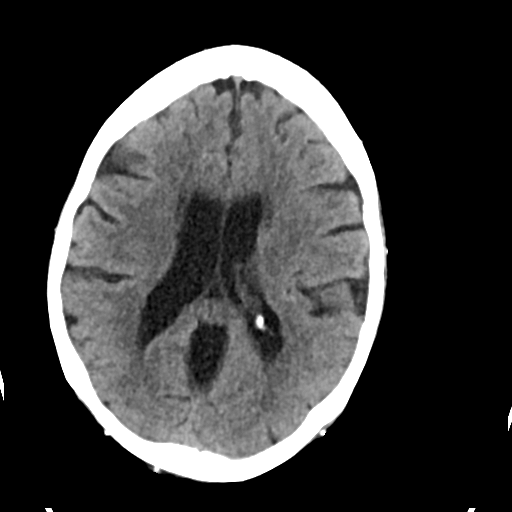
[im 21/33  brain]
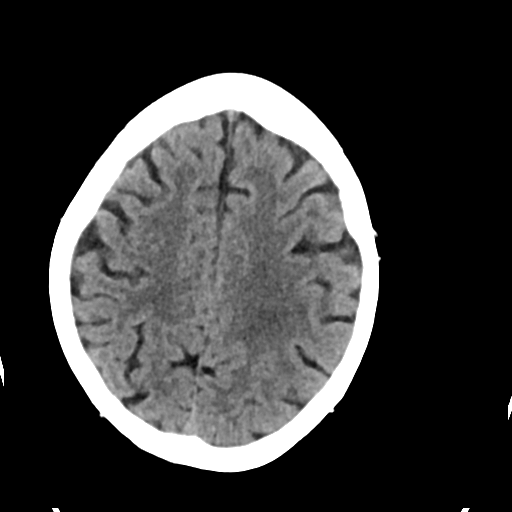
[im 21/33  bone]
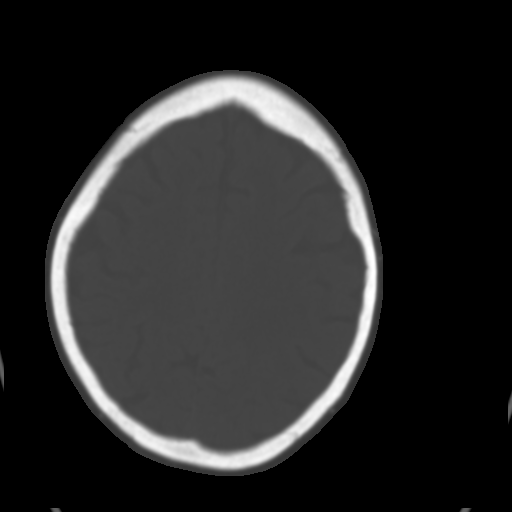
[im 25/33  brain]
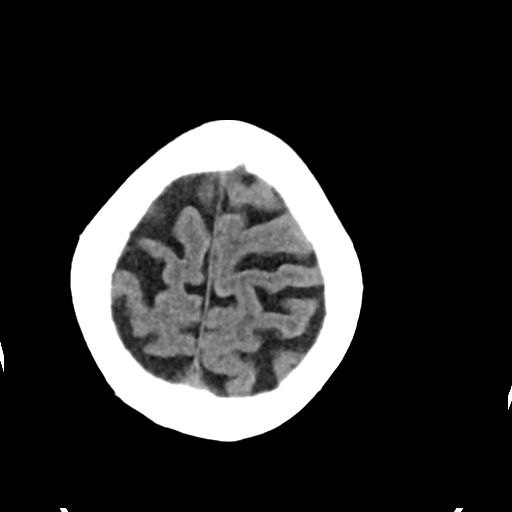
[im 29/33  brain]
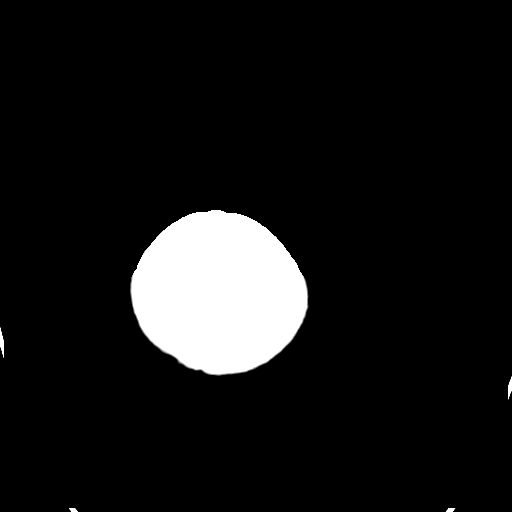

[Series 4: head bone · axial · 0.39mm/px · z∈[-113,-81]mm · 3 of 81 slices shown]
[im 9/81  bone]
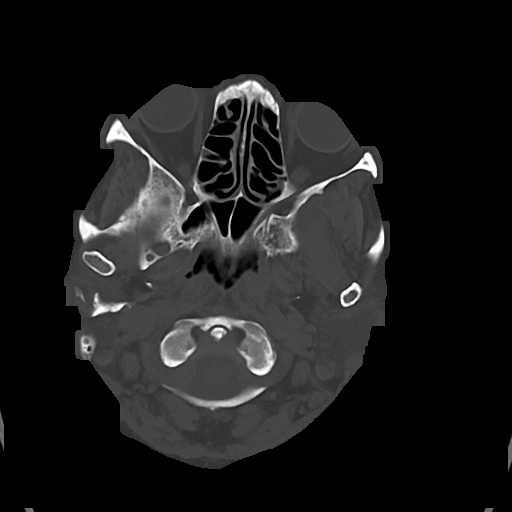
[im 17/81  bone]
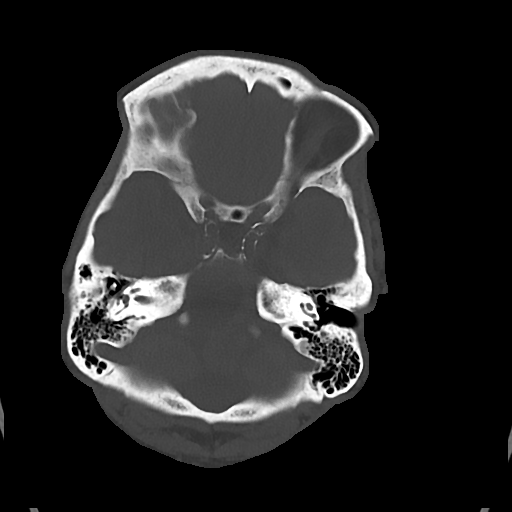
[im 25/81  bone]
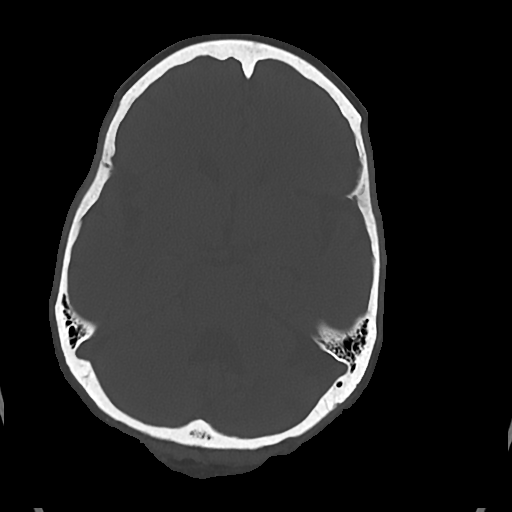

[Series 5: cor soft · coronal · 0.31mm/px · 3 of 67 slices shown]
[im 23/67  brain]
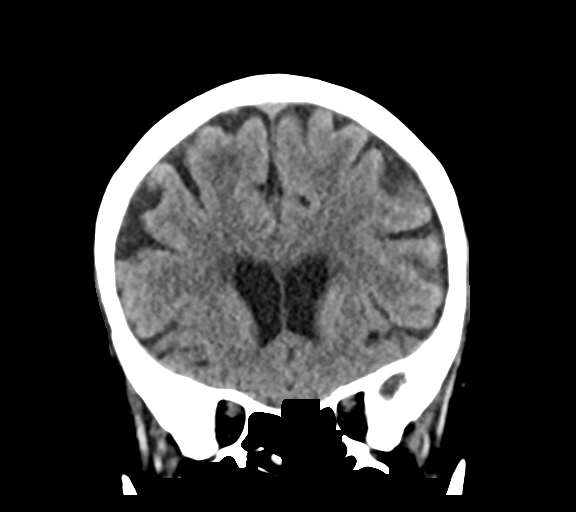
[im 30/67  brain]
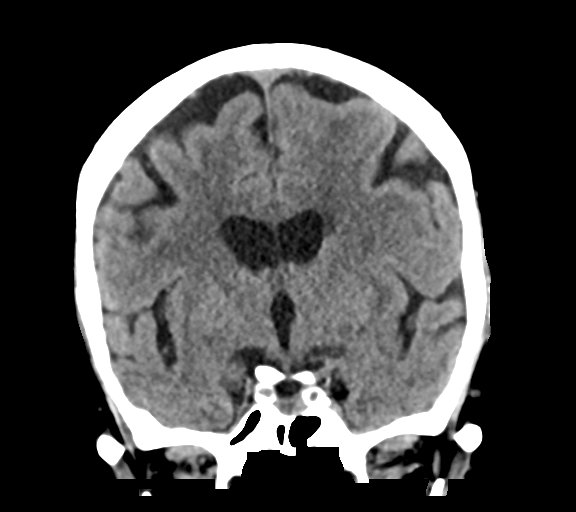
[im 37/67  brain]
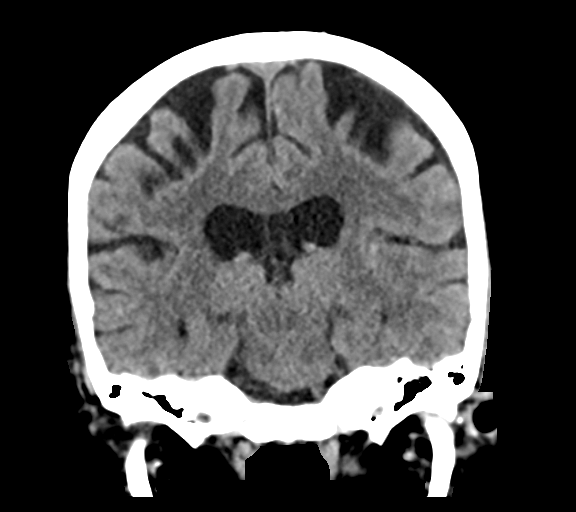

[Series 6: sag soft · sagittal · 0.31mm/px · 3 of 57 slices shown]
[im 19/57  brain]
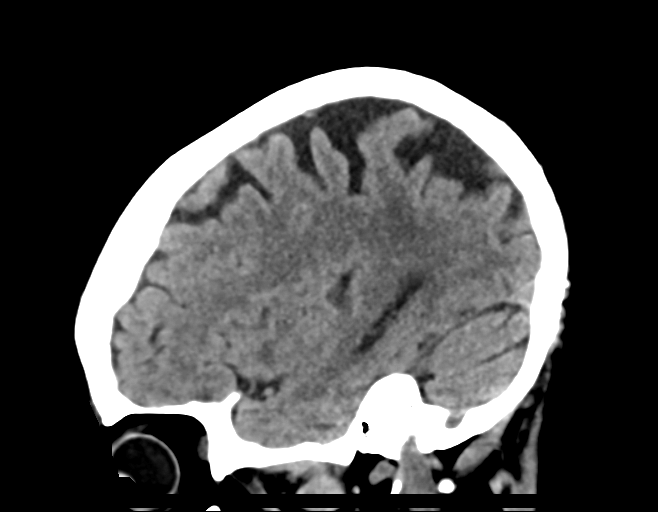
[im 29/57  brain]
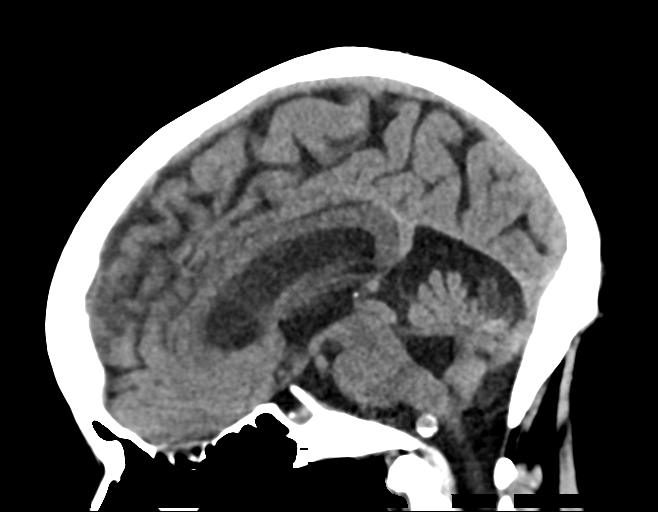
[im 38/57  brain]
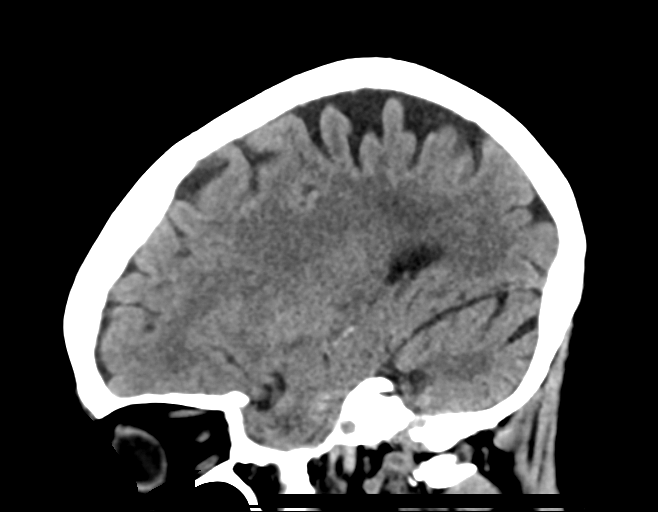

[16 of 47 positions shown; findings below may reference images not displayed]

FINDINGS: Brain: Stable atrophy, small vessel disease and old left cerebellar
infarct. The brain demonstrates no evidence of hemorrhage, acute
infarction, edema, mass effect, extra-axial fluid collection,
hydrocephalus or mass lesion.

Vascular: No hyperdense vessel or unexpected calcification.

Skull: Normal. Negative for fracture or focal lesion.

Sinuses/Orbits: No acute finding.

Other: None.
IMPRESSION: Stable head CT demonstrating no acute findings. Stable atrophy,
small vessel disease and old left cerebellar infarct.

## 2020-03-27 IMAGING — DX DG CHEST 2V
2 series · 2 of 2 positions shown · non-contrast
Comparison: None.

CLINICAL DATA: New dialysis catheter, bleeding at the catheter site

EXAM:
CHEST - 2 VIEW

[chest lat]
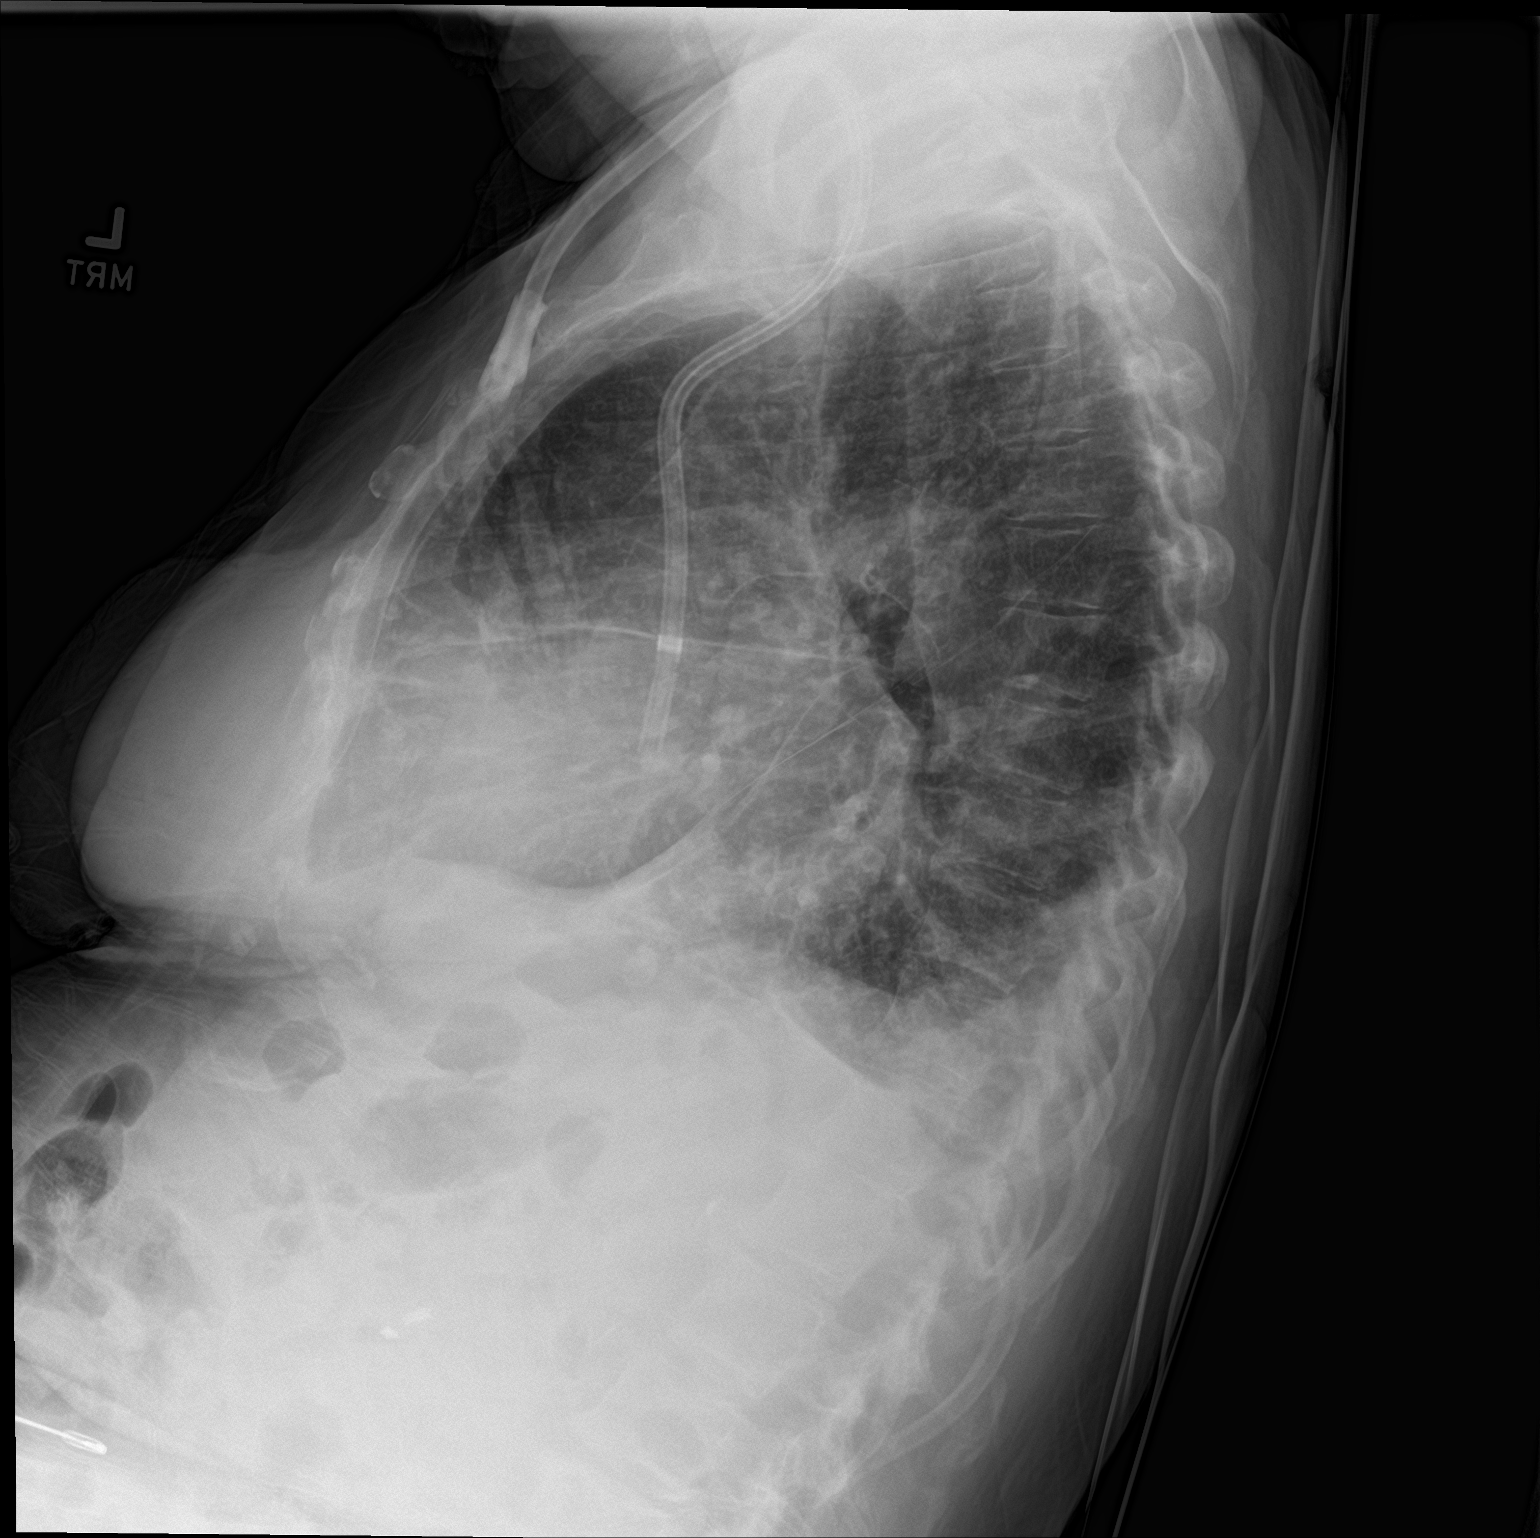

[chest ap]
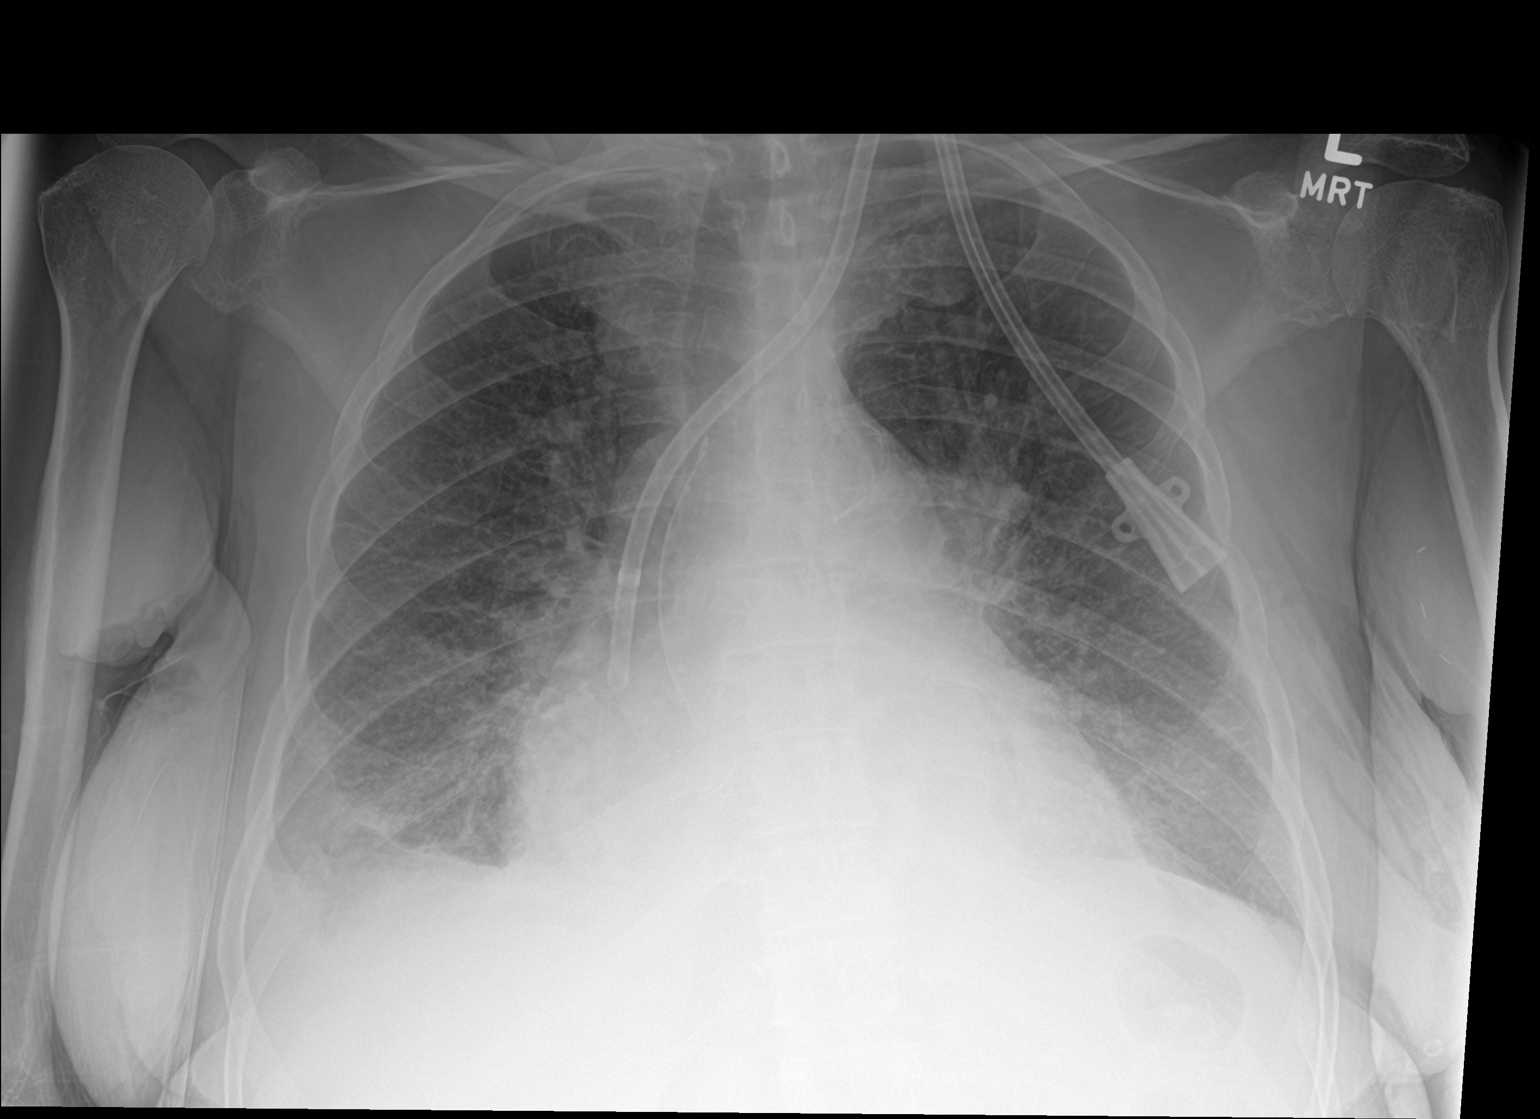

[2 of 2 positions shown; findings below may reference images not displayed]

FINDINGS: Left IJ dialysis catheter tips SVC RA junction. Cardiomegaly with
increased vascular and interstitial prominence compatible with mild
CHF. Small right effusion noted. Minor basilar atelectasis. No
pneumothorax. Aorta is atherosclerotic. Trachea is midline.
Degenerative changes of the spine and shoulders.
IMPRESSION: Cardiomegaly with mild interstitial edema pattern compatible with
early CHF

Trace pleural effusion on the right and basilar atelectasis

Left IJ dialysis catheter tip SVC RA junction level
